# Patient Record
Sex: Female | Born: 1955 | Race: White | Hispanic: No | Marital: Married | Smoking: Never smoker
Health system: Southern US, Community
[De-identification: ages and names within clinical notes are randomized; demographics above are authoritative.]

## PROBLEM LIST (undated history)

## (undated) DIAGNOSIS — E039 Hypothyroidism, unspecified: Secondary | ICD-10-CM

## (undated) DIAGNOSIS — M8580 Other specified disorders of bone density and structure, unspecified site: Secondary | ICD-10-CM

## (undated) DIAGNOSIS — I214 Non-ST elevation (NSTEMI) myocardial infarction: Secondary | ICD-10-CM

## (undated) DIAGNOSIS — I2510 Atherosclerotic heart disease of native coronary artery without angina pectoris: Secondary | ICD-10-CM

## (undated) DIAGNOSIS — Z9289 Personal history of other medical treatment: Secondary | ICD-10-CM

## (undated) DIAGNOSIS — C801 Malignant (primary) neoplasm, unspecified: Secondary | ICD-10-CM

## (undated) DIAGNOSIS — T7840XA Allergy, unspecified, initial encounter: Secondary | ICD-10-CM

## (undated) DIAGNOSIS — F32A Depression, unspecified: Secondary | ICD-10-CM

## (undated) DIAGNOSIS — G4730 Sleep apnea, unspecified: Secondary | ICD-10-CM

## (undated) DIAGNOSIS — J40 Bronchitis, not specified as acute or chronic: Secondary | ICD-10-CM

## (undated) DIAGNOSIS — F988 Other specified behavioral and emotional disorders with onset usually occurring in childhood and adolescence: Secondary | ICD-10-CM

## (undated) DIAGNOSIS — E28319 Asymptomatic premature menopause: Secondary | ICD-10-CM

## (undated) DIAGNOSIS — C50919 Malignant neoplasm of unspecified site of unspecified female breast: Secondary | ICD-10-CM

## (undated) DIAGNOSIS — F419 Anxiety disorder, unspecified: Secondary | ICD-10-CM

## (undated) DIAGNOSIS — Z5111 Encounter for antineoplastic chemotherapy: Secondary | ICD-10-CM

## (undated) DIAGNOSIS — Z923 Personal history of irradiation: Secondary | ICD-10-CM

## (undated) DIAGNOSIS — K921 Melena: Secondary | ICD-10-CM

## (undated) DIAGNOSIS — E785 Hyperlipidemia, unspecified: Secondary | ICD-10-CM

## (undated) DIAGNOSIS — F329 Major depressive disorder, single episode, unspecified: Secondary | ICD-10-CM

## (undated) DIAGNOSIS — F319 Bipolar disorder, unspecified: Secondary | ICD-10-CM

## (undated) DIAGNOSIS — R32 Unspecified urinary incontinence: Secondary | ICD-10-CM

## (undated) DIAGNOSIS — Z9221 Personal history of antineoplastic chemotherapy: Secondary | ICD-10-CM

## (undated) HISTORY — DX: Anxiety disorder, unspecified: F41.9

## (undated) HISTORY — DX: Melena: K92.1

## (undated) HISTORY — DX: Unspecified urinary incontinence: R32

## (undated) HISTORY — DX: Hypothyroidism, unspecified: E03.9

## (undated) HISTORY — DX: Bipolar disorder, unspecified: F31.9

## (undated) HISTORY — DX: Bronchitis, not specified as acute or chronic: J40

## (undated) HISTORY — DX: Sleep apnea, unspecified: G47.30

## (undated) HISTORY — DX: Major depressive disorder, single episode, unspecified: F32.9

## (undated) HISTORY — DX: Personal history of other medical treatment: Z92.89

## (undated) HISTORY — DX: Malignant neoplasm of unspecified site of unspecified female breast: C50.919

## (undated) HISTORY — DX: Encounter for antineoplastic chemotherapy: Z51.11

## (undated) HISTORY — DX: Other specified behavioral and emotional disorders with onset usually occurring in childhood and adolescence: F98.8

## (undated) HISTORY — DX: Hyperlipidemia, unspecified: E78.5

## (undated) HISTORY — DX: Allergy, unspecified, initial encounter: T78.40XA

## (undated) HISTORY — DX: Asymptomatic premature menopause: E28.319

## (undated) HISTORY — DX: Depression, unspecified: F32.A

## (undated) HISTORY — DX: Other specified disorders of bone density and structure, unspecified site: M85.80

## (undated) HISTORY — DX: Malignant (primary) neoplasm, unspecified: C80.1

---

## 1987-02-20 DIAGNOSIS — C50919 Malignant neoplasm of unspecified site of unspecified female breast: Secondary | ICD-10-CM

## 1987-02-20 HISTORY — PX: BREAST SURGERY: SHX581

## 1987-02-20 HISTORY — DX: Malignant neoplasm of unspecified site of unspecified female breast: C50.919

## 1987-02-20 HISTORY — PX: BREAST LUMPECTOMY: SHX2

## 1994-02-19 HISTORY — PX: OTHER SURGICAL HISTORY: SHX169

## 1997-12-21 ENCOUNTER — Other Ambulatory Visit: Admission: RE | Admit: 1997-12-21 | Discharge: 1997-12-21 | Payer: Self-pay | Admitting: Gynecology

## 1998-10-02 ENCOUNTER — Encounter: Payer: Self-pay | Admitting: Pulmonary Disease

## 1998-10-02 ENCOUNTER — Ambulatory Visit: Admission: RE | Admit: 1998-10-02 | Discharge: 1998-10-02 | Payer: Self-pay

## 1999-04-24 ENCOUNTER — Other Ambulatory Visit: Admission: RE | Admit: 1999-04-24 | Discharge: 1999-04-24 | Payer: Self-pay | Admitting: Gynecology

## 1999-04-25 ENCOUNTER — Other Ambulatory Visit: Admission: RE | Admit: 1999-04-25 | Discharge: 1999-04-25 | Payer: Self-pay | Admitting: Gynecology

## 1999-04-25 ENCOUNTER — Encounter (INDEPENDENT_AMBULATORY_CARE_PROVIDER_SITE_OTHER): Payer: Self-pay

## 2000-09-03 ENCOUNTER — Other Ambulatory Visit: Admission: RE | Admit: 2000-09-03 | Discharge: 2000-09-03 | Payer: Self-pay | Admitting: Gynecology

## 2001-02-21 ENCOUNTER — Encounter: Admission: RE | Admit: 2001-02-21 | Discharge: 2001-05-22 | Payer: Self-pay | Admitting: Family Medicine

## 2001-10-10 ENCOUNTER — Other Ambulatory Visit: Admission: RE | Admit: 2001-10-10 | Discharge: 2001-10-10 | Payer: Self-pay | Admitting: Gynecology

## 2001-12-16 ENCOUNTER — Encounter: Admission: RE | Admit: 2001-12-16 | Discharge: 2002-03-16 | Payer: Self-pay | Admitting: Family Medicine

## 2002-04-20 ENCOUNTER — Encounter: Payer: Self-pay | Admitting: Internal Medicine

## 2002-04-28 ENCOUNTER — Encounter: Admission: RE | Admit: 2002-04-28 | Discharge: 2002-07-27 | Payer: Self-pay | Admitting: Family Medicine

## 2002-05-25 ENCOUNTER — Encounter: Admission: RE | Admit: 2002-05-25 | Discharge: 2002-08-23 | Payer: Self-pay | Admitting: Family Medicine

## 2002-10-13 ENCOUNTER — Other Ambulatory Visit: Admission: RE | Admit: 2002-10-13 | Discharge: 2002-10-13 | Payer: Self-pay | Admitting: Gynecology

## 2003-11-22 ENCOUNTER — Other Ambulatory Visit: Admission: RE | Admit: 2003-11-22 | Discharge: 2003-11-22 | Payer: Self-pay | Admitting: Gynecology

## 2004-03-27 ENCOUNTER — Ambulatory Visit: Payer: Self-pay

## 2004-04-24 ENCOUNTER — Ambulatory Visit: Payer: Self-pay

## 2004-04-28 ENCOUNTER — Ambulatory Visit: Payer: Self-pay | Admitting: Cardiology

## 2004-05-25 ENCOUNTER — Ambulatory Visit: Payer: Self-pay | Admitting: Internal Medicine

## 2004-08-11 ENCOUNTER — Ambulatory Visit: Payer: Self-pay | Admitting: Internal Medicine

## 2004-10-05 ENCOUNTER — Ambulatory Visit: Payer: Self-pay | Admitting: Internal Medicine

## 2004-10-12 ENCOUNTER — Ambulatory Visit: Payer: Self-pay | Admitting: Internal Medicine

## 2004-11-01 ENCOUNTER — Ambulatory Visit: Payer: Self-pay | Admitting: Internal Medicine

## 2004-11-27 ENCOUNTER — Ambulatory Visit: Payer: Self-pay | Admitting: Internal Medicine

## 2004-12-07 ENCOUNTER — Other Ambulatory Visit: Admission: RE | Admit: 2004-12-07 | Discharge: 2004-12-07 | Payer: Self-pay | Admitting: Gynecology

## 2005-04-13 ENCOUNTER — Ambulatory Visit: Payer: Self-pay | Admitting: Internal Medicine

## 2005-04-24 ENCOUNTER — Ambulatory Visit: Payer: Self-pay | Admitting: Internal Medicine

## 2005-10-18 ENCOUNTER — Ambulatory Visit: Payer: Self-pay | Admitting: Internal Medicine

## 2005-12-24 ENCOUNTER — Other Ambulatory Visit: Admission: RE | Admit: 2005-12-24 | Discharge: 2005-12-24 | Payer: Self-pay | Admitting: Gynecology

## 2006-01-18 ENCOUNTER — Ambulatory Visit: Payer: Self-pay | Admitting: Internal Medicine

## 2006-02-15 ENCOUNTER — Ambulatory Visit: Payer: Self-pay | Admitting: Internal Medicine

## 2006-05-15 ENCOUNTER — Ambulatory Visit: Payer: Self-pay | Admitting: Internal Medicine

## 2006-08-01 ENCOUNTER — Telehealth: Payer: Self-pay | Admitting: Internal Medicine

## 2006-08-02 ENCOUNTER — Ambulatory Visit: Payer: Self-pay | Admitting: Internal Medicine

## 2006-08-12 DIAGNOSIS — Z853 Personal history of malignant neoplasm of breast: Secondary | ICD-10-CM | POA: Insufficient documentation

## 2006-08-22 ENCOUNTER — Ambulatory Visit: Payer: Self-pay | Admitting: Internal Medicine

## 2006-10-01 ENCOUNTER — Ambulatory Visit: Payer: Self-pay | Admitting: Internal Medicine

## 2006-10-01 DIAGNOSIS — E039 Hypothyroidism, unspecified: Secondary | ICD-10-CM

## 2006-11-04 ENCOUNTER — Telehealth (INDEPENDENT_AMBULATORY_CARE_PROVIDER_SITE_OTHER): Payer: Self-pay | Admitting: *Deleted

## 2006-11-12 ENCOUNTER — Telehealth (INDEPENDENT_AMBULATORY_CARE_PROVIDER_SITE_OTHER): Payer: Self-pay | Admitting: *Deleted

## 2007-01-14 ENCOUNTER — Other Ambulatory Visit: Admission: RE | Admit: 2007-01-14 | Discharge: 2007-01-14 | Payer: Self-pay | Admitting: Gynecology

## 2007-01-27 ENCOUNTER — Encounter: Payer: Self-pay | Admitting: Internal Medicine

## 2007-06-09 ENCOUNTER — Other Ambulatory Visit (HOSPITAL_COMMUNITY): Admission: RE | Admit: 2007-06-09 | Discharge: 2007-06-16 | Payer: Self-pay | Admitting: Psychiatry

## 2007-06-11 ENCOUNTER — Ambulatory Visit: Payer: Self-pay | Admitting: Psychiatry

## 2007-06-25 ENCOUNTER — Encounter: Payer: Self-pay | Admitting: Internal Medicine

## 2007-07-07 ENCOUNTER — Encounter (INDEPENDENT_AMBULATORY_CARE_PROVIDER_SITE_OTHER): Payer: Self-pay | Admitting: *Deleted

## 2007-07-07 ENCOUNTER — Ambulatory Visit: Payer: Self-pay | Admitting: Internal Medicine

## 2007-07-07 DIAGNOSIS — E785 Hyperlipidemia, unspecified: Secondary | ICD-10-CM | POA: Insufficient documentation

## 2007-07-10 ENCOUNTER — Encounter: Payer: Self-pay | Admitting: Internal Medicine

## 2007-07-10 ENCOUNTER — Encounter (INDEPENDENT_AMBULATORY_CARE_PROVIDER_SITE_OTHER): Payer: Self-pay | Admitting: *Deleted

## 2007-07-21 ENCOUNTER — Ambulatory Visit: Payer: Self-pay | Admitting: Pulmonary Disease

## 2007-07-21 DIAGNOSIS — G4733 Obstructive sleep apnea (adult) (pediatric): Secondary | ICD-10-CM

## 2007-07-29 ENCOUNTER — Telehealth (INDEPENDENT_AMBULATORY_CARE_PROVIDER_SITE_OTHER): Payer: Self-pay | Admitting: *Deleted

## 2007-08-25 ENCOUNTER — Telehealth: Payer: Self-pay | Admitting: Internal Medicine

## 2007-08-26 ENCOUNTER — Encounter: Payer: Self-pay | Admitting: Internal Medicine

## 2007-08-28 ENCOUNTER — Telehealth: Payer: Self-pay | Admitting: Internal Medicine

## 2007-08-29 ENCOUNTER — Ambulatory Visit: Payer: Self-pay | Admitting: Internal Medicine

## 2007-09-02 ENCOUNTER — Encounter: Admission: RE | Admit: 2007-09-02 | Discharge: 2007-09-02 | Payer: Self-pay | Admitting: Otolaryngology

## 2007-09-03 ENCOUNTER — Telehealth (INDEPENDENT_AMBULATORY_CARE_PROVIDER_SITE_OTHER): Payer: Self-pay | Admitting: *Deleted

## 2007-09-05 ENCOUNTER — Encounter: Payer: Self-pay | Admitting: Internal Medicine

## 2007-09-07 LAB — CONVERTED CEMR LAB
Bilirubin, Direct: 0.1 mg/dL (ref 0.0–0.3)
Hgb A1c MFr Bld: 6.4 % — ABNORMAL HIGH (ref 4.6–6.0)
Total Bilirubin: 0.6 mg/dL (ref 0.3–1.2)

## 2007-09-09 ENCOUNTER — Encounter (INDEPENDENT_AMBULATORY_CARE_PROVIDER_SITE_OTHER): Payer: Self-pay | Admitting: *Deleted

## 2007-09-09 ENCOUNTER — Encounter: Payer: Self-pay | Admitting: Pulmonary Disease

## 2007-09-11 ENCOUNTER — Telehealth: Payer: Self-pay | Admitting: Internal Medicine

## 2007-09-15 ENCOUNTER — Ambulatory Visit: Payer: Self-pay | Admitting: Gastroenterology

## 2007-09-15 DIAGNOSIS — R1319 Other dysphagia: Secondary | ICD-10-CM

## 2007-09-15 DIAGNOSIS — K222 Esophageal obstruction: Secondary | ICD-10-CM | POA: Insufficient documentation

## 2007-09-15 DIAGNOSIS — R1310 Dysphagia, unspecified: Secondary | ICD-10-CM | POA: Insufficient documentation

## 2007-09-15 DIAGNOSIS — K648 Other hemorrhoids: Secondary | ICD-10-CM | POA: Insufficient documentation

## 2007-09-23 ENCOUNTER — Ambulatory Visit: Payer: Self-pay | Admitting: Internal Medicine

## 2007-09-23 ENCOUNTER — Encounter: Payer: Self-pay | Admitting: Internal Medicine

## 2007-09-24 ENCOUNTER — Encounter: Payer: Self-pay | Admitting: Internal Medicine

## 2007-10-09 ENCOUNTER — Encounter: Payer: Self-pay | Admitting: Internal Medicine

## 2007-10-21 ENCOUNTER — Encounter: Payer: Self-pay | Admitting: Internal Medicine

## 2007-12-18 ENCOUNTER — Telehealth (INDEPENDENT_AMBULATORY_CARE_PROVIDER_SITE_OTHER): Payer: Self-pay | Admitting: *Deleted

## 2008-01-23 ENCOUNTER — Encounter: Payer: Self-pay | Admitting: Internal Medicine

## 2008-01-24 ENCOUNTER — Encounter: Payer: Self-pay | Admitting: Pulmonary Disease

## 2008-02-05 ENCOUNTER — Encounter: Payer: Self-pay | Admitting: Gynecology

## 2008-02-05 ENCOUNTER — Ambulatory Visit: Payer: Self-pay | Admitting: Gynecology

## 2008-02-05 ENCOUNTER — Other Ambulatory Visit: Admission: RE | Admit: 2008-02-05 | Discharge: 2008-02-05 | Payer: Self-pay | Admitting: Gynecology

## 2008-02-10 ENCOUNTER — Encounter: Payer: Self-pay | Admitting: Internal Medicine

## 2008-02-19 ENCOUNTER — Ambulatory Visit: Payer: Self-pay | Admitting: Internal Medicine

## 2008-02-19 DIAGNOSIS — R209 Unspecified disturbances of skin sensation: Secondary | ICD-10-CM

## 2008-02-19 DIAGNOSIS — R259 Unspecified abnormal involuntary movements: Secondary | ICD-10-CM | POA: Insufficient documentation

## 2008-02-24 ENCOUNTER — Encounter (INDEPENDENT_AMBULATORY_CARE_PROVIDER_SITE_OTHER): Payer: Self-pay | Admitting: *Deleted

## 2008-02-24 LAB — CONVERTED CEMR LAB
Albumin: 4.2 g/dL (ref 3.5–5.2)
Alkaline Phosphatase: 125 units/L — ABNORMAL HIGH (ref 39–117)
Folate: 11 ng/mL
Hgb A1c MFr Bld: 8 % — ABNORMAL HIGH (ref 4.6–6.1)
Iron: 99 ug/dL (ref 42–145)
Saturation Ratios: 30 % (ref 20–55)
TIBC: 325 ug/dL (ref 250–470)
TSH: 5.161 microintl units/mL — ABNORMAL HIGH (ref 0.350–4.50)
Total Bilirubin: 0.5 mg/dL (ref 0.3–1.2)
Total Protein: 7.6 g/dL (ref 6.0–8.3)
UIBC: 226 ug/dL

## 2008-04-14 ENCOUNTER — Telehealth: Payer: Self-pay | Admitting: Internal Medicine

## 2008-04-16 ENCOUNTER — Ambulatory Visit: Payer: Self-pay | Admitting: Gynecology

## 2008-04-20 ENCOUNTER — Encounter: Payer: Self-pay | Admitting: Gynecology

## 2008-04-20 ENCOUNTER — Ambulatory Visit: Payer: Self-pay | Admitting: Gynecology

## 2008-04-27 ENCOUNTER — Ambulatory Visit: Payer: Self-pay | Admitting: Internal Medicine

## 2008-05-10 ENCOUNTER — Encounter (INDEPENDENT_AMBULATORY_CARE_PROVIDER_SITE_OTHER): Payer: Self-pay | Admitting: *Deleted

## 2008-05-10 LAB — CONVERTED CEMR LAB
AST: 90 units/L — ABNORMAL HIGH (ref 0–37)
Bilirubin, Direct: 0.1 mg/dL (ref 0.0–0.3)
Creatinine,U: 145.7 mg/dL
Direct LDL: 177 mg/dL
Hgb A1c MFr Bld: 7 % — ABNORMAL HIGH (ref 4.6–6.0)
Microalb, Ur: 2.6 mg/dL — ABNORMAL HIGH (ref 0.0–1.9)
Total Bilirubin: 0.7 mg/dL (ref 0.3–1.2)
Total CHOL/HDL Ratio: 5.4
Triglycerides: 171 mg/dL — ABNORMAL HIGH (ref 0–149)

## 2008-05-18 ENCOUNTER — Telehealth (INDEPENDENT_AMBULATORY_CARE_PROVIDER_SITE_OTHER): Payer: Self-pay | Admitting: *Deleted

## 2008-05-19 ENCOUNTER — Ambulatory Visit: Payer: Self-pay | Admitting: Internal Medicine

## 2008-05-19 DIAGNOSIS — B379 Candidiasis, unspecified: Secondary | ICD-10-CM | POA: Insufficient documentation

## 2008-07-06 ENCOUNTER — Ambulatory Visit: Payer: Self-pay | Admitting: Gynecology

## 2008-07-22 ENCOUNTER — Ambulatory Visit: Payer: Self-pay | Admitting: Internal Medicine

## 2008-07-22 LAB — CONVERTED CEMR LAB
Creatinine, Ser: 0.9 mg/dL (ref 0.4–1.2)
Creatinine,U: 288.7 mg/dL
HDL: 48.7 mg/dL (ref >39.00)
Microalb Creat Ratio: 89.4 mg/g — ABNORMAL HIGH (ref 0.0–30.0)
Microalb, Ur: 25.8 mg/dL — ABNORMAL HIGH (ref 0.0–1.9)
Potassium: 3.8 meq/L (ref 3.5–5.1)
VLDL: 38.6 mg/dL (ref 0.0–40.0)

## 2008-07-28 ENCOUNTER — Ambulatory Visit: Payer: Self-pay | Admitting: Internal Medicine

## 2008-07-28 DIAGNOSIS — R740 Nonspecific elevation of levels of transaminase and lactic acid dehydrogenase [LDH]: Secondary | ICD-10-CM

## 2008-07-28 DIAGNOSIS — E119 Type 2 diabetes mellitus without complications: Secondary | ICD-10-CM | POA: Insufficient documentation

## 2008-10-27 ENCOUNTER — Ambulatory Visit: Payer: Self-pay | Admitting: Internal Medicine

## 2008-11-01 ENCOUNTER — Encounter (INDEPENDENT_AMBULATORY_CARE_PROVIDER_SITE_OTHER): Payer: Self-pay | Admitting: *Deleted

## 2008-11-10 ENCOUNTER — Telehealth (INDEPENDENT_AMBULATORY_CARE_PROVIDER_SITE_OTHER): Payer: Self-pay | Admitting: *Deleted

## 2008-11-12 ENCOUNTER — Ambulatory Visit: Payer: Self-pay | Admitting: Internal Medicine

## 2008-12-16 ENCOUNTER — Telehealth (INDEPENDENT_AMBULATORY_CARE_PROVIDER_SITE_OTHER): Payer: Self-pay | Admitting: *Deleted

## 2009-02-07 ENCOUNTER — Ambulatory Visit: Payer: Self-pay | Admitting: Women's Health

## 2009-02-07 ENCOUNTER — Other Ambulatory Visit: Admission: RE | Admit: 2009-02-07 | Discharge: 2009-02-07 | Payer: Self-pay | Admitting: Gynecology

## 2009-02-19 HISTORY — PX: HYSTEROSCOPY: SHX211

## 2009-03-07 ENCOUNTER — Ambulatory Visit: Payer: Self-pay | Admitting: Internal Medicine

## 2009-03-10 ENCOUNTER — Telehealth (INDEPENDENT_AMBULATORY_CARE_PROVIDER_SITE_OTHER): Payer: Self-pay | Admitting: *Deleted

## 2009-03-10 LAB — CONVERTED CEMR LAB
ALT: 136 units/L — ABNORMAL HIGH (ref 0–35)
AST: 149 units/L — ABNORMAL HIGH (ref 0–37)
Alkaline Phosphatase: 65 units/L (ref 39–117)
Bilirubin, Direct: 0.1 mg/dL (ref 0.0–0.3)
Total Bilirubin: 0.6 mg/dL (ref 0.3–1.2)

## 2009-03-14 ENCOUNTER — Ambulatory Visit: Payer: Self-pay | Admitting: Internal Medicine

## 2009-03-22 DIAGNOSIS — M8580 Other specified disorders of bone density and structure, unspecified site: Secondary | ICD-10-CM

## 2009-03-22 HISTORY — DX: Other specified disorders of bone density and structure, unspecified site: M85.80

## 2009-04-06 ENCOUNTER — Encounter: Payer: Self-pay | Admitting: Internal Medicine

## 2009-04-13 ENCOUNTER — Ambulatory Visit: Payer: Self-pay | Admitting: Women's Health

## 2009-05-19 ENCOUNTER — Ambulatory Visit: Payer: Self-pay | Admitting: Internal Medicine

## 2009-05-19 LAB — CONVERTED CEMR LAB
Bilirubin, Direct: 0 mg/dL (ref 0.0–0.3)
Direct LDL: 176.9 mg/dL
HDL: 63.3 mg/dL (ref >39.00)
Total Bilirubin: 0.7 mg/dL (ref 0.3–1.2)
Total CHOL/HDL Ratio: 4
Total Protein: 8 g/dL (ref 6.0–8.3)
VLDL: 29 mg/dL (ref 0.0–40.0)

## 2009-05-24 ENCOUNTER — Ambulatory Visit: Payer: Self-pay | Admitting: Internal Medicine

## 2009-05-31 ENCOUNTER — Telehealth (INDEPENDENT_AMBULATORY_CARE_PROVIDER_SITE_OTHER): Payer: Self-pay | Admitting: *Deleted

## 2009-05-31 ENCOUNTER — Ambulatory Visit: Payer: Self-pay | Admitting: Gynecology

## 2009-06-07 ENCOUNTER — Ambulatory Visit: Payer: Self-pay | Admitting: Gynecology

## 2009-06-15 ENCOUNTER — Ambulatory Visit (HOSPITAL_COMMUNITY): Admission: RE | Admit: 2009-06-15 | Discharge: 2009-06-15 | Payer: Self-pay | Admitting: Psychiatry

## 2009-06-28 ENCOUNTER — Encounter: Payer: Self-pay | Admitting: Internal Medicine

## 2009-07-06 ENCOUNTER — Ambulatory Visit: Payer: Self-pay | Admitting: Gynecology

## 2009-07-08 ENCOUNTER — Ambulatory Visit: Payer: Self-pay | Admitting: Gynecology

## 2009-07-08 ENCOUNTER — Ambulatory Visit (HOSPITAL_BASED_OUTPATIENT_CLINIC_OR_DEPARTMENT_OTHER): Admission: RE | Admit: 2009-07-08 | Discharge: 2009-07-08 | Payer: Self-pay | Admitting: Gynecology

## 2009-07-22 ENCOUNTER — Ambulatory Visit: Payer: Self-pay | Admitting: Gynecology

## 2009-08-24 ENCOUNTER — Ambulatory Visit: Payer: Self-pay | Admitting: Internal Medicine

## 2009-08-24 LAB — CONVERTED CEMR LAB
AST: 36 units/L (ref 0–37)
Alkaline Phosphatase: 65 units/L (ref 39–117)
Cholesterol: 230 mg/dL — ABNORMAL HIGH (ref 0–200)
Direct LDL: 151.3 mg/dL
Total Bilirubin: 0.3 mg/dL (ref 0.3–1.2)
Total CHOL/HDL Ratio: 4
VLDL: 33 mg/dL (ref 0.0–40.0)

## 2009-09-01 ENCOUNTER — Ambulatory Visit: Payer: Self-pay | Admitting: Internal Medicine

## 2009-09-01 DIAGNOSIS — J309 Allergic rhinitis, unspecified: Secondary | ICD-10-CM | POA: Insufficient documentation

## 2009-09-01 DIAGNOSIS — R32 Unspecified urinary incontinence: Secondary | ICD-10-CM | POA: Insufficient documentation

## 2009-10-18 ENCOUNTER — Ambulatory Visit: Payer: Self-pay | Admitting: Gynecology

## 2009-11-04 ENCOUNTER — Ambulatory Visit: Payer: Self-pay | Admitting: Internal Medicine

## 2009-11-04 DIAGNOSIS — R42 Dizziness and giddiness: Secondary | ICD-10-CM

## 2009-11-04 DIAGNOSIS — R9431 Abnormal electrocardiogram [ECG] [EKG]: Secondary | ICD-10-CM

## 2009-12-28 ENCOUNTER — Telehealth (INDEPENDENT_AMBULATORY_CARE_PROVIDER_SITE_OTHER): Payer: Self-pay | Admitting: *Deleted

## 2010-01-04 ENCOUNTER — Encounter: Payer: Self-pay | Admitting: Internal Medicine

## 2010-02-08 ENCOUNTER — Ambulatory Visit: Payer: Self-pay | Admitting: Gynecology

## 2010-02-08 ENCOUNTER — Encounter: Payer: Self-pay | Admitting: Internal Medicine

## 2010-02-08 ENCOUNTER — Ambulatory Visit: Payer: Self-pay | Admitting: Internal Medicine

## 2010-02-10 LAB — CONVERTED CEMR LAB: Ferritin: 42.4 ng/mL (ref 10.0–291.0)

## 2010-02-15 ENCOUNTER — Telehealth (INDEPENDENT_AMBULATORY_CARE_PROVIDER_SITE_OTHER): Payer: Self-pay | Admitting: *Deleted

## 2010-03-19 LAB — CONVERTED CEMR LAB
ALT: 66 units/L — ABNORMAL HIGH (ref 0–35)
ALT: 95 units/L — ABNORMAL HIGH (ref 0–35)
Albumin: 3.4 g/dL — ABNORMAL LOW (ref 3.5–5.2)
Basophils Absolute: 0 10*3/uL (ref 0.0–0.1)
Basophils Relative: 0.7 % (ref 0.0–1.0)
Bilirubin, Direct: 0.1 mg/dL (ref 0.0–0.3)
CO2: 34 meq/L — ABNORMAL HIGH (ref 19–32)
Calcium: 9.2 mg/dL (ref 8.4–10.5)
Cholesterol, target level: 200 mg/dL
Cholesterol: 167 mg/dL (ref 0–200)
Creatinine, Ser: 0.6 mg/dL (ref 0.4–1.2)
Eosinophils Absolute: 0.2 10*3/uL (ref 0.0–0.7)
Eosinophils Relative: 2 % (ref 0–5)
GFR calc Af Amer: 137 mL/min
Glucose, Bld: 102 mg/dL — ABNORMAL HIGH (ref 70–99)
HCT: 38.9 % (ref 36.0–46.0)
HCT: 47.8 % — ABNORMAL HIGH (ref 36.0–46.0)
Hemoglobin: 13.1 g/dL (ref 12.0–15.0)
Hgb A1c MFr Bld: 5.6 % (ref 4.6–6.5)
LDL Goal: 160 mg/dL
Lymphocytes Relative: 31 % (ref 12–46)
Lymphocytes Relative: 40.6 % (ref 12.0–46.0)
MCHC: 33.6 g/dL (ref 30.0–36.0)
MCV: 86.1 fL (ref 78.0–100.0)
Monocytes Absolute: 0.7 10*3/uL (ref 0.1–1.0)
Monocytes Relative: 11.1 % (ref 3.0–12.0)
Neutro Abs: 2.4 10*3/uL (ref 1.4–7.7)
Platelets: 302 10*3/uL (ref 150–400)
Potassium: 4.7 meq/L (ref 3.5–5.3)
RBC: 4.29 M/uL (ref 3.87–5.11)
RDW: 13.5 % (ref 11.5–14.6)
RDW: 13.7 % (ref 11.5–15.5)
Sodium: 145 meq/L (ref 135–145)
TSH: 0.51 microintl units/mL (ref 0.35–5.50)
TSH: 2.76 microintl units/mL (ref 0.35–5.50)
Total CHOL/HDL Ratio: 4.2
Total Protein: 6.5 g/dL (ref 6.0–8.3)
Total Protein: 7.1 g/dL (ref 6.0–8.3)
Triglycerides: 130 mg/dL (ref 0–149)

## 2010-03-21 LAB — CONVERTED CEMR LAB: Lithium Lvl: 0.63 meq/L — ABNORMAL LOW (ref 0.80–1.40)

## 2010-03-23 NOTE — Progress Notes (Signed)
Summary: Lab Results  Phone Note Outgoing Call Call back at [facility] Phone [phone]   Call placed by: Shonna Chock,  March 10, 2009 10:42 AM Call placed to: Patient Summary of Call: Left message on machine for patient to return call when avaliable (Home Number), Reason for call:   Marked liver function elevations; OV needed with all meds .Shonna Chock  March 10, 2009 10:43 AM   Left message on machine for patient to return call when avaliable(Work Number), Reason for call:  I informed patient of lab results and addressed levels abnormal and she needs to call the office to futher follow-up with Dr.Hopper  Shonna Chock  March 10, 2009 10:46 AM   Follow-up for Phone Call        Abilify &  Seroquel may affect liver enzymes. FAX labs to Dr Andee Poles ASAP Follow-up by: Marga Melnick MD,  March 11, 2009 8:29 AM  Additional Follow-up for Phone Call Additional follow up Details #1::        Patient aware, labs faxed and patient with pending appointment Monday./[facility]  March 11, 2009 11:24 AM

## 2010-03-23 NOTE — Progress Notes (Signed)
  Phone Note Call from Patient Call back at Home Phone [phone]   Caller: Patient Summary of Call: pt called was seen 05/24/09 for thyroid,. she is menopausal and  has started her period. --Wanted to know should she see her gyn, recommend pt to call her gynecologist pt agreed .Kandice Hams  May 31, 2009 8:53 AM   Initial call taken by: Kandice Hams,  May 31, 2009 8:53 AM

## 2010-03-23 NOTE — Progress Notes (Signed)
Summary: Refill Request  Phone Note Refill Request Call back at [phone] Message from:  Pharmacy on February 15, 2010 8:46 AM  Refills Requested: Medication #1:  LEVOXYL 100 MCG  TABS (LEVOTHYROXINE SODIUM) 1 by mouth once daily except 1 & 1/2 Weds only   Dosage confirmed as above?Dosage Confirmed   Brand Name Necessary? No   Supply Requested: 1 month   [facility] [address]   Next Appointment Scheduled: none Initial call taken by: Harold Barban,  February 15, 2010 8:47 AM    Prescriptions: LEVOXYL 100 MCG  TABS (LEVOTHYROXINE SODIUM) 1 by mouth once daily except 1 & 1/2 Weds only  #34 x 11   Entered by:   Shonna Chock CMA   Authorized by:   Marga Melnick MD   Signed by:   Shonna Chock CMA on 02/15/2010   Method used:   [facility]ed to ...       [facility] [address] (retail)       [address]       [place], [place]  [address]       Ph: [phone] or [phone]       Fax: [phone]   RxID:   [id number]

## 2010-03-23 NOTE — Assessment & Plan Note (Signed)
Summary: 3 MONTH FOLLOWUP, DISCUSS LABS///SP[facility]   Vital Signs:  Patient profile:   55 year old female Weight:      172.8 pounds Pulse rate:   80 / minute Resp:     15 per minute BP sitting:   104 / 70  (left arm) Cuff size:   large  Vitals Entered By: Shonna Chock CMA (September 01, 2009 4:14 PM) CC: Follow-up visit: Discuss Labs (copy given), Lipid Management Comments REVIEWED MED LIST, PATIENT AGREED DOSE AND INSTRUCTION CORRECT    Primary Care Provider:  Alwyn Ren  CC:  Follow-up visit: Discuss Labs (copy given) and Lipid Management.  History of Present Illness: Labs reviewed & risks discussed. Dramatic improvement in LFTs & TSH. A1c in non Diabetes range.Major issue is sneeze induced incontinence from seasonal allergies.See ENT ROS.; purulence w/o other rhinosinusitis symptoms.  Lipid Management History:      Positive NCEP/ATP III risk factors include early menopause without estrogen hormone replacement and diabetes.  Negative NCEP/ATP III risk factors include female age less than 76 years old, HDL cholesterol greater than 60, no family history for ischemic heart disease, non-tobacco-user status, non-hypertensive, no ASHD (atherosclerotic heart disease), no prior stroke/TIA, no peripheral vascular disease, and no history of aortic aneurysm.     Allergies (verified): No Known Drug Allergies  Review of Systems General:  Denies chills, fever, and sweats. ENT:  Complains of nasal congestion and sinus pressure; No frontal headache or facial pressure. Some purulence fro nose chronically. GU:  Denies discharge, dysuria, hematuria, and urinary frequency. Endo:  Denies excessive hunger, excessive thirst, and excessive urination. Allergy:  Complains of itching eyes and sneezing; Rx: none.  Physical Exam  General:  well-nourished; alert,appropriate and cooperative throughout examination Eyes:  No corneal or conjunctival inflammation noted. No icterus Ears:  External ear exam shows no  significant lesions or deformities.  Otoscopic examination reveals clear canals, tympanic membranes are intact bilaterally without bulging, retraction, inflammation or discharge. Hearing is grossly normal bilaterally. Nose:  External nasal examination shows no deformity or inflammation. Nasal mucosa are pink and moist without lesions or exudates.Septal dislocation Mouth:  Oral mucosa and oropharynx without lesions or exudates.  Teeth in good repair. Neck:  No deformities, masses, or tenderness noted. Heart:  Normal rate and regular rhythm. S1 and S2 normal without gallop, murmur, click, rub . Abdomen:  Bowel sounds positive,abdomen soft and non-tender without masses, organomegaly or hernias noted. Skin:  No jaundice Cervical Nodes:  No lymphadenopathy noted Axillary Nodes:  No palpable lymphadenopathy Psych:  memory intact for recent and remote, normally interactive, and good eye contact.     Impression & Recommendations:  Problem # 1:  NONSPEC ELEVATION OF LEVELS OF TRANSAMINASE/LDH (ICD-790.4) Dramatic improvement  Problem # 2:  HYPOTHYROIDISM (ICD-244.9) TSH slightly low  Problem # 3:  DIAB W/O COMP TYPE II/UNS NOT STATED UNCNTRL (ICD-250.00) A1c now normal Her updated medication list for this problem includes:    Metformin Hcl 500 Mg Xr24h-tab (Metformin hcl) .Marland Kitchen... 1 pill with  largest meal    Aspirin 81 Mg Tbec (Aspirin) .Marland Kitchen... Take 1 tab once daily  Problem # 4:  ALLERGIC RHINITIS (ICD-477.9)  Her updated medication list for this problem includes:    Fluticasone Propionate 50 Mcg/act Susp (Fluticasone propionate) .Marland Kitchen... 1 spray two times a day    Loratadine 10 Mg Tabs (Loratadine) .Marland Kitchen... 1 once daily as needed for allergies  Problem # 5:  INCONTINENCE (ICD-788.30) dueto sneezing ffom # 4  Complete Medication List: 1)  Paxil 40 Mg Tabs (Paroxetine hcl) .Marland Kitchen.. 1 by mouth qam 2)  Levoxyl 100 Mcg Tabs (levothyroxine Sodium)  .Marland Kitchen.. 1 by mouth once daily except 1 & 1/2 t & sat 3)   Abilify 10 Mg Tabs (Aripiprazole) .... Take 1 tab once daily 4)  Zegerid Otc 20-1100 Mg Caps (Omeprazole-sodium bicarbonate) .... Take 1 tab once daily 5)  Metformin Hcl 500 Mg Xr24h-tab (Metformin hcl) .Marland Kitchen.. 1 pill with  largest meal 6)  Aspirin 81 Mg Tbec (Aspirin) .... Take 1 tab once daily 7)  Zaleplon 10 Mg Caps (Zaleplon) .Marland Kitchen.. 1 by mouth at bedtime 8)  Geodon 60 Mg Caps (Ziprasidone hcl) .Marland Kitchen.. 1 by mouth two times a day 9)  Fluticasone Propionate 50 Mcg/act Susp (Fluticasone propionate) .Marland Kitchen.. 1 spray two times a day 10)  Loratadine 10 Mg Tabs (Loratadine) .Marland Kitchen.. 1 once daily as needed for allergies  Lipid Assessment/Plan:      Based on NCEP/ATP III, the patient's risk factor category is "history of diabetes".  The patient's lipid goals are as follows: Total cholesterol goal is 200; LDL cholesterol goal is 100; HDL cholesterol goal is 40; Triglyceride goal is 150.  Her LDL cholesterol goal has not been met.  Secondary causes for hyperlipidemia have been ruled out.  She has been counseled on adjunctive measures for lowering her cholesterol and has been provided with dietary instructions.    Patient Instructions: 1)  Neti pot once daily as needed for nasal congestion. Stop Welchol.Consume < 30 grams of HFCS sugar/ day as discussed.Please schedule a follow-up appointment in 4 months. 2)  Hepatic Panel prior to visit, ICD-9:790.4 3)  Lipid Panel prior to visit, ICD-9:272.4 4)  HbgA1C prior to visit, ICD-9:250.00 5)  TSH prior to visit, ICD-9:244.99 Prescriptions: LORATADINE 10 MG TABS (LORATADINE) 1 once daily as needed for allergies  #30 x 5   Entered and Authorized by:   Marga Melnick MD   Signed by:   Marga Melnick MD on 09/01/2009   Method used:   Print then Give to Patient   RxID:   [id number] FLUTICASONE PROPIONATE 50 MCG/ACT SUSP (FLUTICASONE PROPIONATE) 1 spray two times a day  #1 x 5   Entered and Authorized by:   Marga Melnick MD   Signed by:   Marga Melnick MD on  09/01/2009   Method used:   Print then Give to Patient   RxID:   [id number] LEVOXYL 100 MCG  TABS (LEVOTHYROXINE SODIUM) 1 by mouth once daily except 1 & 1/2 T & Sat  #90 x 0   Entered and Authorized by:   Marga Melnick MD   Signed by:   Marga Melnick MD on 09/01/2009   Method used:   Print then Give to Patient   RxID:   [id number]

## 2010-03-23 NOTE — Assessment & Plan Note (Signed)
Summary: FOLLOW UP, LABS PRIOR/RH.........Marland Kitchen   Vital Signs:  Patient profile:   55 year old female Weight:      187.6 pounds BMI:     35.87 Temp:     98.6 degrees F oral Pulse rate:   80 / minute Resp:     17 per minute BP sitting:   128 / 78  (left arm) Cuff size:   large  Vitals Entered By: Shonna Chock (May 24, 2009 4:14 PM) CC: 3 month follow-up and ? allergies-sore throat that just started this afternoon Comments REVIEWED MED LIST, PATIENT AGREED DOSE AND INSTRUCTION CORRECT    Primary Care Provider:  Alwyn Ren  CC:  3 month follow-up and ? allergies-sore throat that just started this afternoon.  History of Present Illness: Labs reviewed &  risks discussed. All lipids & LFTs improved despite TSH of 10.21. CVE as walking intermittently; decreased sugars in diet.  Allergies (verified): No Known Drug Allergies  Review of Systems General:  Complains of fatigue; Weight decreasing with nutrition changes. ENT:  Denies difficulty swallowing and hoarseness. GI:  Denies constipation and diarrhea. Derm:  Denies changes in nail beds, dryness, and hair loss. Neuro:  Denies numbness and tingling. Endo:  Complains of cold intolerance; denies heat intolerance.  Physical Exam  General:  in no acute distress; alert,appropriate and cooperative throughout examination Neck:  No deformities, masses, or tenderness noted. Heart:  Normal rate and regular rhythm. S1 and S2 normal without gallop, murmur, click, rub . Neurologic:  alert & oriented X3 and DTRs symmetrical and normal.  Fine tremor of hands Skin:  Intact without suspicious lesions or rashes Psych:  Oriented X3, normally interactive, and good eye contact.     Impression & Recommendations:  Problem # 1:  HYPOTHYROIDISM (ICD-244.9)  Problem # 2:  NONSPEC ELEVATION OF LEVELS OF TRANSAMINASE/LDH (ICD-790.4) improved  Problem # 3:  HYPERLIPIDEMIA (ICD-272.4) improved Her updated medication list for this problem includes:   Welchol 3.75 Gm Pack (Colesevelam hcl) .Marland Kitchen... 1 packet once daily  Complete Medication List: 1)  Paxil 40 Mg Tabs (Paroxetine hcl) .Marland Kitchen.. 1 by mouth qam 2)  Levoxyl 100 Mcg Tabs (levothyroxine Sodium)  .Marland Kitchen.. 1 by mouth once daily except 1 & 1/2 t, th, & sat 3)  Abilify 10 Mg Tabs (Aripiprazole) .... Take 1 tab once daily 4)  Zegerid Otc 20-1100 Mg Caps (Omeprazole-sodium bicarbonate) .... Take 1 tab once daily 5)  Metformin Hcl 500 Mg Xr24h-tab (Metformin hcl) .Marland Kitchen.. 1 two times a day with 2 largest meals 6)  Aspirin 81 Mg Tbec (Aspirin) .... Take 1 tab once daily 7)  Welchol 3.75 Gm Pack (Colesevelam hcl) .Marland Kitchen.. 1 packet once daily 8)  Zaleplon 10 Mg Caps (Zaleplon) .Marland Kitchen.. 1 by mouth at bedtime 9)  Geodon 60 Mg Caps (Ziprasidone hcl) .Marland Kitchen.. 1 by mouth once daily  Patient Instructions: 1)  Please schedule a follow-up appointment in 3 months. 2)  Hepatic Panel prior to visit, ICD-9:790.4 3)  Lipid Panel prior to visit, ICD-9:272.4 4)  TSH prior to visit, ICD-9:244.9 5)  HbgA1C prior to visit, ICD-9:277.7 Prescriptions: LEVOXYL 100 MCG  TABS (LEVOTHYROXINE SODIUM) 1 by mouth once daily except 1 & 1/2 T, Th, & Sat  #90 x 1   Entered and Authorized by:   Marga Melnick MD   Signed by:   Marga Melnick MD on 05/24/2009   Method used:   [facility] ...       [phone] (retail[facility]  [address]       [place], [place]  [address]       Ph: [phone] or [phone]       Fax: [phone]   RxID:   [id number]

## 2010-03-23 NOTE — Letter (Signed)
Summary: [facility]  [facility]   Imported By: Lanelle Bal 07/19/2009 12:51:57  _____________________________________________________________________  External Attachment:    Type:   Image     Comment:   External Document

## 2010-03-23 NOTE — Assessment & Plan Note (Signed)
Summary: FEELS AS IF SHE IS GOING TO PASS OUT.CBS   Vital Signs:  Patient profile:   55 year old female Weight:      169.6 pounds BMI:     32.43 O2 Sat:      98 % Temp:     98.5 degrees F oral Pulse rate:   98 / minute Resp:     17 per minute BP sitting:   116 / 70  (left arm) Cuff size:   large  Vitals Entered By: Shonna Chock CMA (November 04, 2009 2:37 PM) CC: Feel as if she is going to pass out off/on-? if this is all related to her depression, Syncope   Primary Care Provider:  Alwyn Ren  CC:  Feel as if she is going to pass out off/on-? if this is all related to her depression and Syncope.  History of Present Illness:      This is a 55 year old woman who presents with  near syncope on 09/12 & 09/15 . These were @ 1 pm & 3 pm , both pre meals.  The patient reports lightheadedness, but denies frank  loss of consciousness,triggers , premonitory symptoms, palpitations, chest pain, shortness of breath, and incontinence.  Associated symptoms include feeling warm , chilling and diaphoresis.  The patient denies the following symptoms: headache, abdominal discomfort, nausea, vomiting, pallor, focal weakness, and perioral numbness.  The patient reports  no  precipitating factors.  The  symptoms  occurred in the setting of sitting position.  Symptoms lasted 20 minutes ; symptoms improved  with  food intake.FBS  this am was 111; 2 hrs post meal was 93 last night. A1c was 5.7 % & TSH 0.26 in 08/2009.  Current Medications (verified): 1)  Paxil 40 Mg  Tabs (Paroxetine Hcl) .Marland Kitchen.. 1 By Mouth Qam 2)  Levoxyl 100 Mcg  Tabs (Levothyroxine Sodium) .Marland Kitchen.. 1 By Mouth Once Daily Except 1 & 1/2 T & Sat 3)  Metformin Hcl 500 Mg Xr24h-Tab (Metformin Hcl) .Marland Kitchen.. 1 Pill With  Largest Meal 4)  Aspirin 81 Mg Tbec (Aspirin) .... Take 1 Tab Once Daily 5)  Zaleplon 10 Mg Caps (Zaleplon) .Marland Kitchen.. 1 By Mouth At Bedtime 6)  Geodon 60 Mg Caps (Ziprasidone Hcl) .Marland Kitchen.. 1 By Mouth Two Times A Day 7)  Loratadine 10 Mg Tabs  (Loratadine) .Marland Kitchen.. 1 Once Daily As Needed For Allergies 8)  Fluocinonide 0.05 % Oint (Fluocinonide) .... Apply Two Times A Day To Rash As Needed  Allergies (verified): No Known Drug Allergies  Review of Systems Resp:  Complains of hypersomnolence. Psych:  Complains of depression; Geodon dose adjusted by Dr Loralie Champagne office 09/08.Marland Kitchen  Physical Exam  General:  Slughtly tremulous but in no acute distress; alert,appropriate and cooperative throughout examination Eyes:  No corneal or conjunctival inflammation noted. EOMI. Perrla. Field of  Vision grossly normal. Lungs:  Normal respiratory effort, chest expands symmetrically. Lungs are clear to auscultation, no crackles or wheezes. Heart:  Normal rate and regular rhythm. S1 and S2 normal without gallop, murmur, click, rub or other extra sounds. Pulses:  R and L carotid pulses are full and equal bilaterally w/o bruits Neurologic:  alert & oriented X3, cranial nerves II-XII intact, strength normal in all extremities, gait normal, DTRs symmetrical and normal, finger-to-nose with fine tremor, and Romberg negative.   Skin:  Intact without suspicious lesions or rashes Psych:  not anxious appearing, flat affect, and subdued.     Impression & Recommendations:  Problem # 1:  DIZZINESS (ICD-780.4)  ?  hypoglycemia  Her updated medication list for this problem includes:    Loratadine 10 Mg Tabs (Loratadine) .Marland Kitchen... 1 once daily as needed for allergies  Orders: EKG w/ Interpretation (93000) Venipuncture (16109) Specimen Handling (60454)  Problem # 2:  NONSPECIFIC ABNORMAL ELECTROCARDIOGRAM (ICD-794.31)  NS ST-T changes , short PR interval. Similar changes noted in 06/2007. & also on outside EKG 2006.  Orders: EKG w/ Interpretation (93000)  Problem # 3:  DIAB W/O COMP TYPE II/UNS NOT STATED UNCNTRL (ICD-250.00)  Her updated medication list for this problem includes:    Metformin Hcl 500 Mg Xr24h-tab (Metformin hcl) .Marland Kitchen... 1 pill with  largest  meal    Aspirin 81 Mg Tbec (Aspirin) .Marland Kitchen... Take 1 tab once daily  Orders: Venipuncture (09811) Specimen Handling (91478)  Problem # 4:  HYPOTHYROIDISM (ICD-244.9)  Orders: Venipuncture (29562)  Complete Medication List: 1)  Paxil 40 Mg Tabs (Paroxetine hcl) .Marland Kitchen.. 1 by mouth qam 2)  Levoxyl 100 Mcg Tabs (levothyroxine Sodium)  .Marland Kitchen.. 1 by mouth once daily except 1 & 1/2 t & sat 3)  Metformin Hcl 500 Mg Xr24h-tab (Metformin hcl) .Marland Kitchen.. 1 pill with  largest meal 4)  Aspirin 81 Mg Tbec (Aspirin) .... Take 1 tab once daily 5)  Zaleplon 10 Mg Caps (Zaleplon) .Marland Kitchen.. 1 by mouth at bedtime 6)  Geodon 60 Mg Caps (Ziprasidone hcl) .Marland Kitchen.. 1 by mouth two times a day 7)  Loratadine 10 Mg Tabs (Loratadine) .Marland Kitchen.. 1 once daily as needed for allergies 8)  Fluocinonide 0.05 % Oint (Fluocinonide) .... Apply two times a day to rash as needed  Patient Instructions: 1)  Check your blood sugars regularly. If your readings are usually above : 150 or below 90 and > 180  two hrs after largest  meal you should contact our office. Take 15 grams of soy protein with b'fast & lunch .Hold Metformin until lab results return.Marland Kitchen

## 2010-03-23 NOTE — Assessment & Plan Note (Signed)
Summary: 4 MTH FU/NS/KDC   Vital Signs:  Patient profile:   55 year old female Weight:      186.0 pounds Pulse rate:   60 / minute Resp:     16 per minute BP sitting:   122 / 80  (left arm) Cuff size:   large  Vitals Entered By: Shonna Chock (March 14, 2009 2:50 PM) CC: Follow-up visit: copy of labs given Comments REVIEWED MED LIST, PATIENT AGREED DOSE AND INSTRUCTION CORRECT    Primary Care Provider:  Alwyn Ren  CC:  Follow-up visit: copy of labs given.  History of Present Illness: Labs reviewed & risks discussed ; LDL up from 167 to 200 but TG has decreased from 197 to 178 on "decreased sweets" SINCE 12 /2010, ie after holidays. During holidays she was binge eating. Significant elevation of LFTs ( AST  was 82 , now 149 & ALT was 95, now 136).No excess vitamin A, Tylenol or alcohol. Not on statin; no CVE  . A1c  still  in NON Diabetes range @ 6% but up from 5.6%. Weight up 10#. Not checking glucoses.  Allergies (verified): No Known Drug Allergies  Past History:  Past Medical History: Bipolar Disorder, Dr Andee Poles IBS ADD HYPOTHYROIDISM (ICD-244.9) Hyperlipidemia: LDL  167(2615/1044), TG 197, HDL 47 Sleep Apnea  Review of Systems General:  Complains of fatigue; denies weight loss. Eyes:  Denies blurring, double vision, and vision loss-both eyes; Last exam 12 months ago, no retinopathy. CV:  Denies chest pain or discomfort, leg cramps with exertion, lightheadness, near fainting, swelling of feet, and swelling of hands. Derm:  Denies poor wound healing. Neuro:  Denies numbness and tingling. Endo:  Complains of excessive hunger, excessive thirst, and excessive urination.  Physical Exam  General:  in no acute distress; appropriate and cooperative throughout examination Eyes:  No corneal or conjunctival inflammation noted.No icterus. Perrla. Lungs:  Normal respiratory effort, chest expands symmetrically. Lungs are clear to auscultation, no crackles or  wheezes. Heart:  Normal rate and regular rhythm. S1 and S2 normal without gallop, murmur, click, rub. S4 Abdomen:  Bowel sounds positive,abdomen soft and non-tender without masses, organomegaly or hernias noted. Skin:  Intact without suspicious lesions or rashes. No jaundice Psych:  Oriented X3, memory intact for recent and remote, but  subdued.     Impression & Recommendations:  Problem # 1:  HYPERLIPIDEMIA (ICD-272.4)  Her updated medication list for this problem includes:    Welchol 3.75 Gm Pack (Colesevelam hcl) .Marland Kitchen... 1 packet once daily  Problem # 2:  NONSPEC ELEVATION OF LEVELS OF TRANSAMINASE/LDH (ICD-790.4) ? iatrogenic  Problem # 3:  DIAB W/O COMP TYPE II/UNS NOT STATED UNCNTRL (ICD-250.00)  The following medications were removed from the medication list:    Metformin Hcl 500 Mg Tabs (Metformin hcl) .Marland Kitchen... Take 2 tab two times a day Her updated medication list for this problem includes:    Metformin Hcl 500 Mg Xr24h-tab (Metformin hcl) .Marland Kitchen... 1 two times a day with 2 largest meals    Aspirin 81 Mg Tbec (Aspirin) .Marland Kitchen... Take 1 tab once daily  Complete Medication List: 1)  Paxil 40 Mg Tabs (Paroxetine hcl) .Marland Kitchen.. 1 by mouth qam 2)  Levoxyl 100 Mcg Tabs (levothyroxine Sodium)  .Marland Kitchen.. 1 by mouth once daily except 1 & 1/2 sun 3)  Abilify 10 Mg Tabs (Aripiprazole) .... Take 1 tab once daily 4)  Zegerid Otc 20-1100 Mg Caps (Omeprazole-sodium bicarbonate) .... Take 1 tab once daily 5)  Metformin Hcl 500 Mg  Xr24h-tab (Metformin hcl) .Marland Kitchen.. 1 two times a day with 2 largest meals 6)  Seroquel 100 Mg Tabs (Quetiapine fumarate) .... Take 1 1/2 once daily 7)  Aspirin 81 Mg Tbec (Aspirin) .... Take 1 tab once daily 8)  Welchol 3.75 Gm Pack (Colesevelam hcl) .Marland Kitchen.. 1 packet once daily  Patient Instructions: 1)  Avoid High Fructose Corn Syrup as #1,2 or #3  on label. Consider [employer]  low carb program ,except do not restrict natural  sugar in fruits. 2)  Please schedule a follow-up  appointment in 3 months. 3)  Hepatic Panel prior to visit, ICD-9:790.4 4)  Lipid Panel prior to visit, ICD-9:272.4 5)  TSH prior to visit, ICD-9:244.9 Prescriptions: LEVOXYL 100 MCG  TABS (LEVOTHYROXINE SODIUM) 1 by mouth once daily except 1 & 1/2 Sun  #90 x 1   Entered and Authorized by:   Marga Melnick MD   Signed by:   Marga Melnick MD on 03/14/2009   Method used:   Print then Give to Patient   RxID:   [id number] [facility] 3.75 GM PACK (COLESEVELAM HCL) 1 packet once daily  #30 x 5   Entered and Authorized by:   Marga Melnick MD   Signed by:   Marga Melnick MD on 03/14/2009   Method used:   Print then Give to Patient   RxID:   [id number] METFORMIN HCL 500 MG XR24H-TAB (METFORMIN HCL) 1 two times a day with 2 largest meals  #180 x 1   Entered and Authorized by:   Marga Melnick MD   Signed by:   Marga Melnick MD on 03/14/2009   Method used:   Print then Give to Patient   RxID:   [id number]

## 2010-03-23 NOTE — Progress Notes (Signed)
Summary: List of Concerns Brought by Patient  List of Concerns Brought by Patient   Imported By: Lanelle Bal 11/14/2009 08:58:53  _____________________________________________________________________  External Attachment:    Type:   Image     Comment:   External Document

## 2010-03-23 NOTE — Letter (Signed)
Summary: Ollen Gross PhD Clinical Psychology  Ollen Gross PhD Clinical Psychology   Imported By: Lanelle Bal 04/12/2009 11:16:28  _____________________________________________________________________  External Attachment:    Type:   Image     Comment:   External Document

## 2010-03-23 NOTE — Progress Notes (Signed)
Summary: Refill Request  Phone Note Refill Request Call back at [phone] Message from:  Pharmacy on December 28, 2009 10:29 AM  Refills Requested: Medication #1:  LEVOXYL 100 MCG  TABS (LEVOTHYROXINE SODIUM) 1 by mouth once daily except 1 & 1/2 Weds only   Dosage confirmed as above?Dosage Confirmed   Brand Name Necessary? No   Supply Requested: 1 month   [facility] [address]  Next Appointment Scheduled: 11.15.11 Initial call taken by: Harold Barban,  December 28, 2009 10:29 AM  Follow-up for Phone Call        COPIED FROM 11/04/2009 LAB APPEND  TSH (thyroid ) is too low , goal = 1-3 ideally. DECREASE Levoxyl to 1 pill once daily EXCEPT 1& 1/2 on Weds only. Recheck TSH in 10 weeks( 244.9). A1c is in NON Diabetes range; the nutrition changes you have made are working ! STOP Metformin. Recheck A1c in 10 weeks also (790.29). [facility]opp Follow-up by: Shonna Chock CMA,  December 28, 2009 11:39 AM    Prescriptions: LEVOXYL 100 MCG  TABS (LEVOTHYROXINE SODIUM) 1 by mouth once daily except 1 & 1/2 Weds only  #34 x 0   Entered by:   Shonna Chock CMA   Authorized by:   Marga Melnick MD   Signed by:   Shonna Chock CMA on 12/28/2009   Method used:   [facility]axed to ...       [facility] [address] (retail)       [address]       [place], [place]  [address]       Ph: [phone] or [phone]       Fax: [phone]   RxID:   [id number]

## 2010-03-23 NOTE — Medication Information (Signed)
Summary: Nonadherence with Metformin/[employer]  Nonadherence with Metformin/[employer]   Imported By: Lanelle Bal 02/15/2010 08:14:10  _____________________________________________________________________  External Attachment:    Type:   Image     Comment:   External Document

## 2010-05-08 LAB — GLUCOSE, CAPILLARY
Glucose-Capillary: 80 mg/dL (ref 70–99)
Glucose-Capillary: 95 mg/dL (ref 70–99)

## 2010-07-20 ENCOUNTER — Other Ambulatory Visit: Payer: Self-pay | Admitting: Otolaryngology

## 2010-07-20 DIAGNOSIS — H905 Unspecified sensorineural hearing loss: Secondary | ICD-10-CM

## 2010-07-28 ENCOUNTER — Ambulatory Visit
Admission: RE | Admit: 2010-07-28 | Discharge: 2010-07-28 | Disposition: A | Discharge status: Home / Self Care | Payer: 59 | Source: Ambulatory Visit | Attending: Otolaryngology | Admitting: Otolaryngology

## 2010-07-28 DIAGNOSIS — H905 Unspecified sensorineural hearing loss: Secondary | ICD-10-CM

## 2010-08-03 ENCOUNTER — Ambulatory Visit: Admission: RE | Admit: 2010-08-03 | Payer: 59 | Source: Ambulatory Visit

## 2010-08-03 ENCOUNTER — Other Ambulatory Visit: Payer: Self-pay | Admitting: Otolaryngology

## 2010-08-03 ENCOUNTER — Ambulatory Visit
Admission: RE | Admit: 2010-08-03 | Discharge: 2010-08-03 | Disposition: A | Discharge status: Home / Self Care | Payer: 59 | Source: Ambulatory Visit | Attending: Otolaryngology | Admitting: Otolaryngology

## 2010-09-21 ENCOUNTER — Other Ambulatory Visit: Payer: Self-pay | Admitting: *Deleted

## 2010-09-21 DIAGNOSIS — N6009 Solitary cyst of unspecified breast: Secondary | ICD-10-CM

## 2010-10-05 ENCOUNTER — Encounter: Payer: Self-pay | Admitting: Gynecology

## 2010-10-19 DIAGNOSIS — F988 Other specified behavioral and emotional disorders with onset usually occurring in childhood and adolescence: Secondary | ICD-10-CM | POA: Insufficient documentation

## 2010-10-19 DIAGNOSIS — E785 Hyperlipidemia, unspecified: Secondary | ICD-10-CM | POA: Insufficient documentation

## 2010-10-19 DIAGNOSIS — C801 Malignant (primary) neoplasm, unspecified: Secondary | ICD-10-CM | POA: Insufficient documentation

## 2010-10-19 DIAGNOSIS — E28319 Asymptomatic premature menopause: Secondary | ICD-10-CM | POA: Insufficient documentation

## 2010-10-19 DIAGNOSIS — F319 Bipolar disorder, unspecified: Secondary | ICD-10-CM | POA: Insufficient documentation

## 2010-10-19 DIAGNOSIS — F329 Major depressive disorder, single episode, unspecified: Secondary | ICD-10-CM | POA: Insufficient documentation

## 2010-10-19 DIAGNOSIS — E039 Hypothyroidism, unspecified: Secondary | ICD-10-CM | POA: Insufficient documentation

## 2010-10-19 DIAGNOSIS — F419 Anxiety disorder, unspecified: Secondary | ICD-10-CM | POA: Insufficient documentation

## 2010-10-25 ENCOUNTER — Encounter: Payer: 59 | Admitting: Gynecology

## 2010-10-26 ENCOUNTER — Encounter: Payer: 59 | Admitting: Gynecology

## 2010-11-08 ENCOUNTER — Other Ambulatory Visit (HOSPITAL_COMMUNITY)
Admission: RE | Admit: 2010-11-08 | Discharge: 2010-11-08 | Disposition: A | Discharge status: Home / Self Care | Payer: 59 | Source: Ambulatory Visit | Attending: Gynecology | Admitting: Gynecology

## 2010-11-08 ENCOUNTER — Ambulatory Visit (INDEPENDENT_AMBULATORY_CARE_PROVIDER_SITE_OTHER): Payer: 59 | Admitting: Gynecology

## 2010-11-08 ENCOUNTER — Encounter: Payer: Self-pay | Admitting: Gynecology

## 2010-11-08 VITALS — BP 130/70 | Ht 61.0 in | Wt 199.0 lb

## 2010-11-08 DIAGNOSIS — C50911 Malignant neoplasm of unspecified site of right female breast: Secondary | ICD-10-CM

## 2010-11-08 DIAGNOSIS — N9089 Other specified noninflammatory disorders of vulva and perineum: Secondary | ICD-10-CM

## 2010-11-08 DIAGNOSIS — C50919 Malignant neoplasm of unspecified site of unspecified female breast: Secondary | ICD-10-CM

## 2010-11-08 DIAGNOSIS — Z01419 Encounter for gynecological examination (general) (routine) without abnormal findings: Secondary | ICD-10-CM | POA: Insufficient documentation

## 2010-11-08 NOTE — Progress Notes (Signed)
[name] [facility] 30-Mar-1955 [id number]        55 y.o.  for annual exam.  Doing well from a gynecologic standpoint. She does note a bump of the last several weeks on her right labia she wants me to look at.  Past medical history,surgical history, medications, allergies, family history and social history were all reviewed and documented in the EPIC chart. ROS:  Was performed and pertinent positives and negatives are included in the history.  Exam: chaperone present Filed Vitals:   11/08/10 1008  BP: 130/70   General appearance  Normal Skin grossly normal Head/Neck normal with no cervical or supraclavicular adenopathy thyroid normal Lungs  clear Cardiac RR, without RMG Abdominal  soft, nontender, without masses, organomegaly or hernia Breasts  examined lying and sitting.  Left  without masses, retractions, discharge or axillary adenopathy.  Right status post lumpectomy radiation changes smaller than left breast without masses retractions discharge axillary adenopathy. Pelvic  Ext/BUS/vagina  Normal, mild atrophic changes, with small classic appearing angioma right mid labia majora  Cervix  normal  Pap done  Uterus  anteverted, normal size, shape and contour, midline and mobile nontender   Adnexa  Without masses or tenderness    Anus and perineum  normal   Rectovaginal  normal sphincter tone without palpated masses or tenderness.    Assessment/Plan:  55 y.o. female for annual exam.    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 Options of excision versus observation reviewed with the patient patient's comfortable with watching and will follow this. If any changes to represent for excision. #2 History of breast cancer. Recently had followup mammogram ultrasound last month. We'll continue with the screening is recommended by her oncologist. #3 Health maintenance. Patient being followed for a number of issues per her medical history. Routinely sees Dr.  Alwyn Ren. No blood work was done was all done through his office. She's unsure when her last colonoscopy was recommended she call make sure she's not doing will follow up with them in reference to this. Her bone density last year showed osteopenia we'll repeat this next year. She is on extra calcium vitamin D. Assuming she continues well from a GYN standpoint she will see [place] in a year sooner as needed    Dara Lords MD, 10:38 AM 11/08/2010

## 2011-01-23 ENCOUNTER — Ambulatory Visit (INDEPENDENT_AMBULATORY_CARE_PROVIDER_SITE_OTHER): Payer: 59 | Admitting: Internal Medicine

## 2011-01-23 ENCOUNTER — Encounter: Payer: Self-pay | Admitting: Internal Medicine

## 2011-01-23 VITALS — BP 118/76 | HR 77 | Wt 200.2 lb

## 2011-01-23 DIAGNOSIS — E039 Hypothyroidism, unspecified: Secondary | ICD-10-CM

## 2011-01-23 MED ORDER — LEVOTHYROXINE SODIUM 100 MCG PO TABS: ORAL_TABLET | ORAL | Status: DC

## 2011-01-23 NOTE — Progress Notes (Signed)
  Subjective:    Patient ID: [name], female    DOB: Jan 18, 1956, 55 y.o.   MRN: [id number]  HPI Thyroid function monitor  Medications status(change in dose/brand/mode of administration):no Constitutional: Weight change: no; Fatigue:yes, ?from new job; Sleep pattern:chronic, intermittent insomnia; Appetite:wonderful  Visual change(blurred/diplopia/visual loss):some blurring Hoarseness:no; Swallowing issues:no Cardiovascular: Palpitations:no; Racing:no; Irregularity:no GI:  Alternating constipation & loose stool Derm: Change in nails/hair/skin:no Neuro: Numbness/tingling:no; Tremor:no Psych: Anxiety:with new job; Depression:improved with job; Panic attacks:no Endo: Temperature intolerance: Heat:yes; Cold:no. Labs: TSH was reduced at 0.346; T4 (not free T4) was 14.7. Her lithium level was therapeutic at 0.8.         Review of Systems     Objective:   Physical Exam  Gen.:  well-nourished; in no acute distress Eyes: Extraocular motion intact; no lid lag or proptosis Neck: thyroid normal Heart: Normal rhythm and rate without significant murmur, gallop, or extra heart sounds Lungs: Chest clear to auscultation without rales,rales, wheezes Neuro:Deep tendon reflexes are equal and within normal limits; no tremor  Skin: Warm and dry without significant lesions or rashes; no onycholysis Psych: Normally communicative and interactive; no abnormal mood or affect clinically.        Assessment & Plan:  #1 abnormal thyroid function values; excess thyroid supplementation suggested. The T4, but not a free T4, would be affected by her lithium.  Plan: I will be decreased and a free T4 and TSH checked after 8-10 weeks.

## 2011-01-23 NOTE — Patient Instructions (Signed)
Please  schedule  Labs in 10 weeks : FREE T4, TSH. PLEASE BRING THESE INSTRUCTIONS TO FOLLOW UP  LAB APPOINTMENT.This will guarantee correct labs are drawn, eliminating need for repeat blood sampling ( needle sticks ! ). Diagnoses /Codes: 244.9

## 2011-02-20 HISTORY — PX: FOOT SURGERY: SHX648

## 2011-03-20 ENCOUNTER — Other Ambulatory Visit: Payer: Self-pay | Admitting: *Deleted

## 2011-03-20 DIAGNOSIS — Z09 Encounter for follow-up examination after completed treatment for conditions other than malignant neoplasm: Secondary | ICD-10-CM

## 2011-03-27 ENCOUNTER — Other Ambulatory Visit: Payer: Self-pay | Admitting: Gynecology

## 2011-03-27 DIAGNOSIS — Z09 Encounter for follow-up examination after completed treatment for conditions other than malignant neoplasm: Secondary | ICD-10-CM

## 2011-06-25 ENCOUNTER — Telehealth: Payer: Self-pay | Admitting: Internal Medicine

## 2011-06-25 DIAGNOSIS — E039 Hypothyroidism, unspecified: Secondary | ICD-10-CM

## 2011-06-25 NOTE — Telephone Encounter (Signed)
Refill requested for  ROPINIRole HCl (Tab) REQUIP 1 MG Take 1 mg by mouth at bedtime. -3 hrs before sleep  Not prescribed by Alwyn Ren before but is on meds list  Last office visit 12.4.12 notes say Please schedule Labs in 10 weeks : FREE T4, TSH. PLEASE BRING THESE INSTRUCTIONS TO FOLLOW UP LAB APPOINTMENT.This will guarantee correct labs are drawn, eliminating need for repeat blood sampling ( needle sticks ! ). Diagnoses /Codes: 244.9   Patient wants to come here for labs & if Dr.Hopper will not call in medication above for her, I can make her an appointment for that as well. Please review and advise Thanks Patient ph# [phone]

## 2011-06-26 ENCOUNTER — Telehealth: Payer: Self-pay | Admitting: Internal Medicine

## 2011-06-26 MED ORDER — ROPINIROLE HCL 1 MG PO TABS: 1.0000 mg | ORAL_TABLET | Freq: Every day | ORAL | Status: DC

## 2011-06-26 NOTE — Telephone Encounter (Signed)
OK X 3 mos 

## 2011-06-26 NOTE — Telephone Encounter (Signed)
Appointment made

## 2011-06-26 NOTE — Telephone Encounter (Signed)
Dr.Hopper please advise on refill request, med never rx'ed by you before

## 2011-06-26 NOTE — Telephone Encounter (Signed)
Rx already sent to pharmacy, Pt aware.

## 2011-06-26 NOTE — Telephone Encounter (Signed)
Caller: Shifa/Patient is calling with a Medication Question re: Ropinrole 1 mg PO -3 hours before sleep - takes for Restless Leg Sydrome. The medication was written by Dr. Nolen Mu, but she is now going to [facility] and they refilled her Psych meds but not the Ropinirole. She is wondering if Dr. Alwyn Ren can take over refilling this medication. It was originally prescribed to help her sleep and since she is out of med she is having trouble sleeping  again and with restless legs. . [phone]. PLEASE ASK PHARMACY TO CALL HER WHEN MED READY FOR PICK UP.

## 2011-06-26 NOTE — Telephone Encounter (Signed)
RX sent, Future orders placed. Ok to schedule patient for lab appointment (ok to sign encounter once appointment scheduled)

## 2011-07-02 ENCOUNTER — Other Ambulatory Visit (INDEPENDENT_AMBULATORY_CARE_PROVIDER_SITE_OTHER): Payer: 59

## 2011-07-02 DIAGNOSIS — E039 Hypothyroidism, unspecified: Secondary | ICD-10-CM

## 2011-07-03 LAB — T4, FREE: Free T4: 0.89 ng/dL (ref 0.60–1.60)

## 2011-07-24 ENCOUNTER — Encounter: Payer: Self-pay | Admitting: Internal Medicine

## 2011-07-24 ENCOUNTER — Ambulatory Visit (INDEPENDENT_AMBULATORY_CARE_PROVIDER_SITE_OTHER): Payer: BC Managed Care – PPO | Admitting: Internal Medicine

## 2011-07-24 VITALS — BP 118/76 | HR 68 | Temp 98.6°F | Wt 191.0 lb

## 2011-07-24 DIAGNOSIS — M542 Cervicalgia: Secondary | ICD-10-CM

## 2011-07-24 MED ORDER — CELECOXIB 200 MG PO CAPS: 200.0000 mg | ORAL_CAPSULE | Freq: Two times a day (BID) | ORAL | Status: AC

## 2011-07-24 MED ORDER — CYCLOBENZAPRINE HCL 5 MG PO TABS: ORAL_TABLET | ORAL | Status: DC

## 2011-07-24 NOTE — Progress Notes (Signed)
  Subjective:    Patient ID: [name], female    DOB: 07-Jul-1955, 56 y.o.   MRN: [id number]  HPI NECK PAIN: Location: L   Onset: 3-4 weeks ago   Severity: 5/10-10/10 Pain is described as: intermittent aching, sometimes shooting  Worse with: lying down at night   Better with: nothing, ibuprofen not helpful Pain radiates to: L occiput   Impaired range of motion: when turning head to left  History of repetitive motion:  no History of overuse or hyperextension:  no History of trauma:  no Past history of similar problem:  no  Review of Systems  Back Pain:  no Numbness/tingling:  no Weakness:  no  Red Flags Fever:  no Headache:  no  Bowel/bladder dysfunction:  no    Objective:   Physical Exam  There is decreased range of motion laterally of the neck.  There is  full range of motion without decreased strength or tone in upper extremities No rash is present in the area of  discomfort.  She finds massage to be a positive experience ratherthan eliciting pain in the area of the discomfort  There is no lymphadenopathy about the neck or axilla.  DTRs are normal  Cranial nerve exam is negative         Assessment & Plan:  #1 cervical myalgia with no neuromuscular deficit  Plan: Her options were reviewed. These include physical therapy, chiropractory, and/or massage therapy. She'll be given samples of Celebrex and a prescription written for a muscle relaxant

## 2011-07-24 NOTE — Patient Instructions (Signed)
Use a cervical memory foam pillow to prevent hyperextension or hyperflexion of the cervical spine. Massage therapy for the upper back and neck muscles would be of benefit and would be medically indicated . Use an anti-inflammatory cream such as Aspercreme or Zostrix cream twice a day to the left neck as needed. In lieu of this warm moist compresses or  hot water bottle can be used. Do not apply ice

## 2011-08-21 ENCOUNTER — Other Ambulatory Visit: Payer: Self-pay | Admitting: Internal Medicine

## 2011-09-17 ENCOUNTER — Encounter: Payer: Self-pay | Admitting: Family Medicine

## 2011-09-17 ENCOUNTER — Telehealth: Payer: Self-pay | Admitting: Internal Medicine

## 2011-09-17 ENCOUNTER — Ambulatory Visit (HOSPITAL_BASED_OUTPATIENT_CLINIC_OR_DEPARTMENT_OTHER)
Admission: RE | Admit: 2011-09-17 | Discharge: 2011-09-17 | Disposition: A | Discharge status: Home / Self Care | Payer: BC Managed Care – PPO | Source: Ambulatory Visit | Attending: Family Medicine | Admitting: Family Medicine

## 2011-09-17 ENCOUNTER — Ambulatory Visit (INDEPENDENT_AMBULATORY_CARE_PROVIDER_SITE_OTHER): Payer: BC Managed Care – PPO | Admitting: Family Medicine

## 2011-09-17 VITALS — BP 124/77 | HR 90 | Temp 98.2°F | Ht 60.25 in | Wt 190.4 lb

## 2011-09-17 DIAGNOSIS — R42 Dizziness and giddiness: Secondary | ICD-10-CM | POA: Insufficient documentation

## 2011-09-17 DIAGNOSIS — R279 Unspecified lack of coordination: Secondary | ICD-10-CM

## 2011-09-17 DIAGNOSIS — R278 Other lack of coordination: Secondary | ICD-10-CM | POA: Insufficient documentation

## 2011-09-17 DIAGNOSIS — Z79899 Other long term (current) drug therapy: Secondary | ICD-10-CM

## 2011-09-17 DIAGNOSIS — G319 Degenerative disease of nervous system, unspecified: Secondary | ICD-10-CM | POA: Insufficient documentation

## 2011-09-17 DIAGNOSIS — I679 Cerebrovascular disease, unspecified: Secondary | ICD-10-CM | POA: Insufficient documentation

## 2011-09-17 NOTE — Patient Instructions (Addendum)
We will notify you of your lab results and your CT results If your symptoms change or worsen- please call or go to the ER Hang in there!!!

## 2011-09-17 NOTE — Assessment & Plan Note (Signed)
New.  Suspect Li toxicity but must r/o intracranial process.  Check CT, labs.  No meds at this time as this might exacerbate problem.  If labs and CT normal will refer to neuro.

## 2011-09-17 NOTE — Telephone Encounter (Signed)
Caller: Talah/Patient; PCP: Marga Melnick; CB#: [phone]. Call regarding Vertigo that began early last week and persists today. Some nausea last week that has since resolved. No congestion or sxs of illness. Per Dizziness or Vertigo Protocol, see in 24 hrs, appt scheduled for today, Monday 7/29 at 16:00 with Dr Beverely Low. Caller is agreeable.

## 2011-09-17 NOTE — Assessment & Plan Note (Signed)
New.  Again suspect Li toxicity but will get additional labs to r/o thyroid, anemia, metabolic abnormality.  No meclizine to avoid increased lethargy.  If labs and CT unrevealing will need neuro referral.

## 2011-09-17 NOTE — Assessment & Plan Note (Signed)
Check labs to r/o toxicity.  Pt unable to tell me name of psychiatrist that she is seeing

## 2011-09-17 NOTE — Progress Notes (Signed)
  Subjective:    Patient ID: [name], female    DOB: 1955-05-13, 56 y.o.   MRN: [id number]  HPI Dizziness- pt denies hx of similar but it is listed on her problem list.  sxs started 'sometime last week'.  sxs occur w/ changing position.  Doesn't occur w/ turning head.  No nausea.  No sinus pain/pressure.  Has sensation of water in L ear.  No fevers.  Husband reports short term memory loss- pt mentioned it to nurse but did not mention this to me until directly questioned, 'i forgot'.  Pt states 'i don't know what he's talking about'.  'then there's that silly email i sent him last night'.  Pt sent husband an email in the middle of night (has been sleeping in guest room) but when husband sent email back to pt, 'it made no sense'.  Vaguely remembers sending an email and thinking 'this is taking forever'.  Not currently on any sleep aides but reports she feels like she's 'on Ambien- you know, doing stuff you're not aware of'.  Has not had Li level checked recently, thyroid checked in May.  Had surgery 7/2- then had vicodin and phenergan.  Can't remember the last time she took either med.   Review of Systems For ROS see HPI     Objective:   Physical Exam  Vitals reviewed. Constitutional: She appears well-developed and well-nourished.  Eyes: Conjunctivae and EOM are normal.       Pupils minimally reactive bilaterally but symmetric 3-4 beats of horizontal nystagmus bilaterally  Neck: Normal range of motion. Neck supple.  Cardiovascular: Normal rate, regular rhythm, normal heart sounds and intact distal pulses.   Pulmonary/Chest: Effort normal and breath sounds normal. No respiratory distress. She has no wheezes. She has no rales.  Lymphadenopathy:    She has no cervical adenopathy.  Neurological: She is alert. She displays abnormal reflex (very brisk reflexes- almost hyperreflexic- both upper and lower extremities). No cranial nerve deficit. Coordination (positive Romberg, unable to do tandem  gait, ataxia- almost unable to pick feet up off the floor) abnormal.  Psychiatric:       Flat, almost 'foggy'          Assessment & Plan:

## 2011-09-18 ENCOUNTER — Other Ambulatory Visit (HOSPITAL_BASED_OUTPATIENT_CLINIC_OR_DEPARTMENT_OTHER): Payer: BC Managed Care – PPO

## 2011-09-18 LAB — BASIC METABOLIC PANEL
Chloride: 106 mEq/L (ref 96–112)
Potassium: 4 mEq/L (ref 3.5–5.1)
Sodium: 141 mEq/L (ref 135–145)

## 2011-09-18 LAB — CBC WITH DIFFERENTIAL/PLATELET
Basophils Absolute: 0 10*3/uL (ref 0.0–0.1)
Basophils Relative: 0.5 % (ref 0.0–3.0)
Eosinophils Absolute: 0.4 10*3/uL (ref 0.0–0.7)
Lymphocytes Relative: 27 % (ref 12.0–46.0)
MCHC: 32.3 g/dL (ref 30.0–36.0)
Neutrophils Relative %: 58 % (ref 43.0–77.0)
Platelets: 180 10*3/uL (ref 150.0–400.0)
RBC: 4.34 Mil/uL (ref 3.87–5.11)
RDW: 14.1 % (ref 11.5–14.6)

## 2011-09-18 LAB — LITHIUM LEVEL: Lithium Lvl: 0.7 mEq/L — ABNORMAL LOW (ref 0.80–1.40)

## 2011-09-18 LAB — TSH: TSH: 2.01 u[IU]/mL (ref 0.35–5.50)

## 2011-09-18 NOTE — Addendum Note (Signed)
Addended by: Derry Lory A on: 09/18/2011 08:49 AM   Modules accepted: Orders

## 2011-09-19 ENCOUNTER — Encounter: Payer: Self-pay | Admitting: *Deleted

## 2011-09-24 ENCOUNTER — Other Ambulatory Visit: Payer: Self-pay | Admitting: Internal Medicine

## 2011-11-12 ENCOUNTER — Encounter: Payer: BC Managed Care – PPO | Admitting: Gynecology

## 2011-11-16 ENCOUNTER — Other Ambulatory Visit: Payer: Self-pay | Admitting: Internal Medicine

## 2011-11-16 ENCOUNTER — Encounter: Payer: BC Managed Care – PPO | Admitting: Gynecology

## 2011-11-16 NOTE — Telephone Encounter (Signed)
#  90 , R X 1. TSH due by 7/14

## 2011-11-16 NOTE — Telephone Encounter (Signed)
Need okay.  Plz advise     MW 

## 2011-11-16 NOTE — Telephone Encounter (Signed)
Rx sent.    MW 

## 2011-12-07 ENCOUNTER — Encounter: Payer: BC Managed Care – PPO | Admitting: Gynecology

## 2011-12-21 ENCOUNTER — Encounter: Payer: BC Managed Care – PPO | Admitting: Gynecology

## 2012-01-04 ENCOUNTER — Encounter: Payer: Self-pay | Admitting: Gynecology

## 2012-01-04 ENCOUNTER — Ambulatory Visit (INDEPENDENT_AMBULATORY_CARE_PROVIDER_SITE_OTHER): Payer: BC Managed Care – PPO | Admitting: Gynecology

## 2012-01-04 VITALS — BP 128/78 | Ht 60.25 in | Wt 194.0 lb

## 2012-01-04 DIAGNOSIS — M8580 Other specified disorders of bone density and structure, unspecified site: Secondary | ICD-10-CM

## 2012-01-04 DIAGNOSIS — C50919 Malignant neoplasm of unspecified site of unspecified female breast: Secondary | ICD-10-CM

## 2012-01-04 DIAGNOSIS — Z01419 Encounter for gynecological examination (general) (routine) without abnormal findings: Secondary | ICD-10-CM

## 2012-01-04 DIAGNOSIS — M949 Disorder of cartilage, unspecified: Secondary | ICD-10-CM

## 2012-01-04 DIAGNOSIS — N952 Postmenopausal atrophic vaginitis: Secondary | ICD-10-CM

## 2012-01-04 NOTE — Progress Notes (Signed)
[name] [facility] Jun 26, 1955 [id number]        56 y.o.  G0P0 for annual exam.    Past medical history,surgical history, medications, allergies, family history and social history were all reviewed and documented in the EPIC chart. ROS:  Was performed and pertinent positives and negatives are included in the history.  Exam: Biomedical scientist Filed Vitals:   01/04/12 1602  BP: 128/78  Height: 5' 0.25" (1.53 m)  Weight: 194 lb (87.998 kg)   General appearance  Normal Skin grossly normal Head/Neck normal with no cervical or supraclavicular adenopathy thyroid normal Lungs  clear Cardiac RR, without RMG Abdominal  soft, nontender, without masses, organomegaly or hernia Breasts  examined lying and sitting. Left without masses, retractions, discharge or axillary adenopathy.  Right status post lumpectomy/radiation changes. Without masses, discharge or adenopathy Pelvic  Ext/BUS/vagina  normal with atrophic changes  Cervix  normal   Uterus  retroverted, normal size, shape and contour, midline and mobile nontender   Adnexa  Without masses or tenderness    Anus and perineum  normal   Rectovaginal  normal sphincter tone without palpated masses or tenderness.    Assessment/Plan:  56 y.o. G0P0 female for annual exam.   1. Postmenopausal/atrophic changes. Without significant symptoms. We'll continue to monitor. 2. History of breast cancer 1989.  Mammography February 2013 normal. Continue annual mammography. SBE monthly reviewed. 3. Pap smear. No Pap smear done today. Pap smear 10/2010 normal. No history of significant abnormalities in the past. Plan every 3 year Pap smears. 4. Osteopenia. DEXA 03/2009 with T score -2.1 FRAX 4.6%/0.4% plan repeat now a two-year interval. Patient will schedule. Increase calcium vitamin D reviewed. 5. Colonoscopy up to date with planned repeat in 5 your interval. 6. Health maintenance. No blood work done as this is all done through Dr. Caryl Never office who actively follows  the patient. Follow up for DEXA otherwise one year, sooner as needed    Dara Lords MD, 4:53 PM 01/04/2012

## 2012-01-04 NOTE — Patient Instructions (Signed)
Followup for bone density as scheduled, otherwise in one year for annual exam. 

## 2012-01-05 LAB — URINALYSIS W MICROSCOPIC + REFLEX CULTURE

## 2012-01-07 ENCOUNTER — Other Ambulatory Visit: Payer: Self-pay | Admitting: Gynecology

## 2012-01-07 DIAGNOSIS — R350 Frequency of micturition: Secondary | ICD-10-CM

## 2012-01-08 ENCOUNTER — Other Ambulatory Visit: Payer: BC Managed Care – PPO

## 2012-01-11 ENCOUNTER — Other Ambulatory Visit: Payer: BC Managed Care – PPO

## 2012-01-11 DIAGNOSIS — R350 Frequency of micturition: Secondary | ICD-10-CM

## 2012-01-12 LAB — URINALYSIS W MICROSCOPIC + REFLEX CULTURE
Bacteria, UA: NONE SEEN
Bilirubin Urine: NEGATIVE
Casts: NONE SEEN
Crystals: NONE SEEN
Ketones, ur: NEGATIVE mg/dL
Nitrite: NEGATIVE
Specific Gravity, Urine: 1.021 (ref 1.005–1.030)
pH: 7.5 (ref 5.0–8.0)

## 2012-01-13 LAB — URINE CULTURE

## 2012-01-14 ENCOUNTER — Other Ambulatory Visit: Payer: Self-pay | Admitting: Gynecology

## 2012-01-14 ENCOUNTER — Telehealth: Payer: Self-pay | Admitting: Gynecology

## 2012-01-14 DIAGNOSIS — R82998 Other abnormal findings in urine: Secondary | ICD-10-CM

## 2012-01-14 NOTE — Telephone Encounter (Signed)
Tell patient that her urine had white cells in it but did not grow any specific bacteria. Ask her to repeat a clean-catch urinalysis at her convenience.

## 2012-01-24 NOTE — Telephone Encounter (Signed)
Pt was informed on 01/14/12 documented on result note by KA. KW

## 2012-02-29 ENCOUNTER — Ambulatory Visit (INDEPENDENT_AMBULATORY_CARE_PROVIDER_SITE_OTHER): Payer: BC Managed Care – PPO | Admitting: Internal Medicine

## 2012-02-29 ENCOUNTER — Encounter: Payer: Self-pay | Admitting: *Deleted

## 2012-02-29 ENCOUNTER — Encounter: Payer: Self-pay | Admitting: Lab

## 2012-02-29 ENCOUNTER — Encounter: Payer: Self-pay | Admitting: Internal Medicine

## 2012-02-29 VITALS — BP 120/82 | HR 76 | Temp 97.9°F | Wt 183.2 lb

## 2012-02-29 DIAGNOSIS — M542 Cervicalgia: Secondary | ICD-10-CM

## 2012-02-29 DIAGNOSIS — M899 Disorder of bone, unspecified: Secondary | ICD-10-CM

## 2012-02-29 DIAGNOSIS — M546 Pain in thoracic spine: Secondary | ICD-10-CM

## 2012-02-29 DIAGNOSIS — M8580 Other specified disorders of bone density and structure, unspecified site: Secondary | ICD-10-CM

## 2012-02-29 MED ORDER — TRAMADOL HCL 50 MG PO TABS: ORAL_TABLET | ORAL | Status: DC

## 2012-02-29 MED ORDER — CYCLOBENZAPRINE HCL 5 MG PO TABS: ORAL_TABLET | ORAL | Status: DC

## 2012-02-29 NOTE — Progress Notes (Signed)
  Subjective:    Patient ID: [name], female    DOB: 06/15/1955, 57 y.o.   MRN: [id number]  HPI BACK PAIN Location:R  upper back   Onset: 2 mos ago Trigger/ injury : no   Severity:up to 9 Pain is described WU:JWJX , throbbing or sharp  Worse with: sleeping   Better with: heat & ice  help; NSAIDS   ? benfit Pain radiates to: neck   Impaired range of motion:yes Repetitive motion: no  History of overuse or hyperextension:no History of trauma: no Past history of similar problem: no        Review of Systems Numbness/tingling: no Weakness:  no  Red Flags Fever/ chills/ sweats:  No Weight loss : yes due to stress Headache: no  Bowel/bladder dysfunction: no      Objective:   Physical Exam  Gen. Appearance:adequately nourished, in no distress Eyes: Extraocular motion intact, field of vision normal, vision grossly intact, no nystagmus. Slight lid lag ENT:  hearing grossly normal Neck: decreased range of motion, no masses, normal thyroid Cardiovascular: Rate and rhythm normal; no murmurs, gallops . S 4 Muscle skeletal: Accentuated curvature of upper thoracic  spine, tone, &  strength normal Neuro:no cranial nerve deficit, deep tendon  reflexes normal, gait normal. Finger to nose & Rhomberg normal Lymph: No cervical or axillary LA Skin: Warm and dry without suspicious lesions or rashes Psych: no anxiety or mood change. Normally interactive and cooperative.         Assessment & Plan:  #1 musculoskeletal back and neck pain  #2 past history of significant osteopenia with T score of -2.1 in the spine in 2011. She's overdue for followup. Additionally vitamin D should be verified Plan: See orders and recommendations

## 2012-02-29 NOTE — Patient Instructions (Addendum)
Order for x-rays entered into  the computer; these will be performed at [address] [facility] across from [facility]. No appointment is necessary.  Use an anti-inflammatory cream such as Aspercreme or Zostrix cream twice a day to the left neck as needed. In lieu of this warm moist compresses or  hot water bottle can be used. Do not apply ice .Sleep with a cervical memory foam pillow to prevent hyperextension or hyperflexion of the cervical spine.  Schedule BMD @ Dr Reynold Bowen

## 2012-03-05 ENCOUNTER — Telehealth: Payer: Self-pay | Admitting: *Deleted

## 2012-03-05 ENCOUNTER — Other Ambulatory Visit: Payer: Self-pay | Admitting: Gynecology

## 2012-03-05 DIAGNOSIS — Z1231 Encounter for screening mammogram for malignant neoplasm of breast: Secondary | ICD-10-CM

## 2012-03-05 LAB — VITAMIN D 1,25 DIHYDROXY
Vitamin D2 1, 25 (OH)2: <8 pg/mL
Vitamin D3 1, 25 (OH)2: 37 pg/mL

## 2012-03-05 NOTE — Telephone Encounter (Signed)
Notified by [facility] that urine sample received in lab was not labeled and was subsequently rejected. Patient needs to submit to another UDS at next visit.

## 2012-03-25 ENCOUNTER — Ambulatory Visit
Admission: RE | Admit: 2012-03-25 | Discharge: 2012-03-25 | Disposition: A | Discharge status: Home / Self Care | Payer: BC Managed Care – PPO | Source: Ambulatory Visit | Attending: Gynecology | Admitting: Gynecology

## 2012-03-25 DIAGNOSIS — Z1231 Encounter for screening mammogram for malignant neoplasm of breast: Secondary | ICD-10-CM

## 2012-03-26 ENCOUNTER — Other Ambulatory Visit: Payer: Self-pay | Admitting: Internal Medicine

## 2012-04-07 ENCOUNTER — Ambulatory Visit (INDEPENDENT_AMBULATORY_CARE_PROVIDER_SITE_OTHER)
Admission: RE | Admit: 2012-04-07 | Discharge: 2012-04-07 | Disposition: A | Discharge status: Home / Self Care | Payer: BC Managed Care – PPO | Source: Ambulatory Visit | Attending: Internal Medicine | Admitting: Internal Medicine

## 2012-04-07 DIAGNOSIS — M542 Cervicalgia: Secondary | ICD-10-CM

## 2012-04-28 ENCOUNTER — Ambulatory Visit: Payer: BC Managed Care – PPO | Admitting: Internal Medicine

## 2012-04-28 ENCOUNTER — Other Ambulatory Visit (INDEPENDENT_AMBULATORY_CARE_PROVIDER_SITE_OTHER): Payer: BC Managed Care – PPO

## 2012-04-28 DIAGNOSIS — E039 Hypothyroidism, unspecified: Secondary | ICD-10-CM

## 2012-04-28 DIAGNOSIS — Z79899 Other long term (current) drug therapy: Secondary | ICD-10-CM

## 2012-04-28 NOTE — Progress Notes (Signed)
Labs only

## 2012-04-29 LAB — CBC WITH DIFFERENTIAL/PLATELET
Basophils Absolute: 0 10*3/uL (ref 0.0–0.1)
Eosinophils Absolute: 0.3 10*3/uL (ref 0.0–0.7)
Lymphocytes Relative: 17.1 % (ref 12.0–46.0)
MCHC: 31.8 g/dL (ref 30.0–36.0)
Neutrophils Relative %: 71.3 % (ref 43.0–77.0)
Platelets: 232 10*3/uL (ref 150.0–400.0)
RBC: 4.77 Mil/uL (ref 3.87–5.11)
RDW: 14 % (ref 11.5–14.6)

## 2012-04-29 LAB — BASIC METABOLIC PANEL
Calcium: 9.4 mg/dL (ref 8.4–10.5)
GFR: 84.11 mL/min (ref >60.00)
Glucose, Bld: 84 mg/dL (ref 70–99)
Sodium: 136 mEq/L (ref 135–145)

## 2012-04-29 LAB — TSH: TSH: 3.82 u[IU]/mL (ref 0.35–5.50)

## 2012-04-30 ENCOUNTER — Ambulatory Visit (INDEPENDENT_AMBULATORY_CARE_PROVIDER_SITE_OTHER): Payer: BC Managed Care – PPO | Admitting: Internal Medicine

## 2012-04-30 VITALS — BP 122/78 | HR 83 | Temp 98.6°F | Wt 174.0 lb

## 2012-04-30 DIAGNOSIS — M542 Cervicalgia: Secondary | ICD-10-CM

## 2012-04-30 DIAGNOSIS — K589 Irritable bowel syndrome without diarrhea: Secondary | ICD-10-CM

## 2012-04-30 NOTE — Progress Notes (Signed)
Subjective:    Patient ID: [name], female    DOB: Jan 15, 1956, 57 y.o.   MRN: [id number]  HPI #1 since October 2013 she's had nausea associated with decreased appetite and decreased oral intake. She describes loose to watery stools almost daily. This is associated with cramping abdominal discomfort. She believes his symptoms are related to her medications. No treatment.  She denies any exposures related travel, suspicious foods, sick individuals, or recent antibiotics.  She's actually lost 25 pounds since August 2013.  She has a past history of esophageal stricture. She denies dysphagia, melena, rectal bleeding.  She had extensive labs done 04/28/12. CBC and differential, chemistries, TSH, and lithium level were normal or therapeutic.  #2 She has had neck & R upper back pain since 12/2011.Onset  in context of  trauma after falling out bed & over a step stool. Pain described as dull - throbbing aching  & worse @ night.Pain lasts hours through night. Exacerbating factors include posterior neck rotation & exogenous stress. Pain improved with cervical pillow.Prescription pain medication  & topical cream also helped. Cervical spine films 04/07/12 revealed evidence of chronic multilevel cervical disc degeneration with no acute bony abnormality. There is straightening of the cervical lordosis.          Review of Systems Two episodes of  fecal incontinence after flatus. One-2 episodes of urinary incontinence in past several months No dysuria/ pyuria/hematuria No rash in area of pain No numbness/tingling or upper or lower extremity weakness No fever/chills/sweats        Objective:   Physical Exam Gen.: Adequately nourished in appearance. Alert, appropriate and cooperative throughout exam.  Head: Normocephalic without obvious abnormalities  Eyes: No corneal or conjunctival inflammation noted. No icterus. Mouth: Oral mucosa and oropharynx reveal no lesions or exudates. Teeth in  good repair. Neck: No deformities, masses, or tenderness noted. Range of motion markedly decreased. Thyroid normal. Lungs: Normal respiratory effort; chest expands symmetrically. Lungs are clear to auscultation without rales, wheezes, or increased work of breathing. Heart: Normal rate and rhythm. Normal S1 and S2. No gallop, click, or rub. S4 w/o murmur. Abdomen: Bowel sounds normal; abdomen soft and nontender. No masses, organomegaly or hernias noted.Dullness RUQ                              Musculoskeletal/extremities: Accentuated curvature of upper thoracic  Spine. No clubbing, cyanosis, edema, or significant extremity  deformity noted. Range of motion normal .Tone & strength normal to opposition. Joints normal. Nail health good. Able to lie down & sit up w/o help. Negative SLR bilaterally Vascular: Carotid, radial artery, dorsalis pedis and  posterior tibial pulses are full and equal. No bruits present. Neurologic: Alert and oriented x3. Deep tendon reflexes symmetrical and normal.           Skin: Intact without suspicious lesions or rashes. Lymph: No cervical, axillary lymphadenopathy present. Psych: Mood and affect are normal. Normally interactive  Assessment & Plan:  #1 bowel changes with signs and symptoms of IBS  #2 muscle skeletal neck pain; no neuromuscular deficit present  Plan: See orders and recommendations

## 2012-04-30 NOTE — Patient Instructions (Addendum)
Please take the probiotic , Align, every day until the bowels are normal. This will replace the normal bacteria which  are necessary for formation of normal stool and processing of food. Immodium AD as needed for frank diarrhea    Use an anti-inflammatory cream such as Aspercreme or Zostrix cream twice a day to the neck as needed. In lieu of this warm moist compresses or  hot water bottle can be used. Do not apply ice.Use a cervical memory foam pillow to prevent hyperextension or hyperflexion of the cervical spine.   Review and correct the record as indicated. Please share record with all medical staff seen.

## 2012-05-02 ENCOUNTER — Encounter: Payer: Self-pay | Admitting: Internal Medicine

## 2012-05-07 ENCOUNTER — Telehealth: Payer: Self-pay | Admitting: Internal Medicine

## 2012-05-07 NOTE — Telephone Encounter (Signed)
In reference to Chiropractic referral entered on 04/30/12, per my call to [facility], they called patient, and patient stated she did not want to schedule an appointment.

## 2012-07-03 ENCOUNTER — Ambulatory Visit (INDEPENDENT_AMBULATORY_CARE_PROVIDER_SITE_OTHER): Payer: BC Managed Care – PPO | Admitting: Internal Medicine

## 2012-07-03 ENCOUNTER — Encounter: Payer: Self-pay | Admitting: Internal Medicine

## 2012-07-03 VITALS — BP 126/84 | HR 81 | Temp 98.5°F | Resp 16 | Wt 177.5 lb

## 2012-07-03 DIAGNOSIS — K589 Irritable bowel syndrome without diarrhea: Secondary | ICD-10-CM

## 2012-07-03 DIAGNOSIS — R141 Gas pain: Secondary | ICD-10-CM

## 2012-07-03 DIAGNOSIS — R140 Abdominal distension (gaseous): Secondary | ICD-10-CM

## 2012-07-03 DIAGNOSIS — R112 Nausea with vomiting, unspecified: Secondary | ICD-10-CM

## 2012-07-03 LAB — HEPATIC FUNCTION PANEL
AST: 42 U/L — ABNORMAL HIGH (ref 0–37)
Albumin: 3.2 g/dL — ABNORMAL LOW (ref 3.5–5.2)
Total Bilirubin: 0.5 mg/dL (ref 0.3–1.2)

## 2012-07-03 LAB — CBC WITH DIFFERENTIAL/PLATELET
Basophils Relative: 0.3 % (ref 0.0–3.0)
Eosinophils Absolute: 0.4 10*3/uL (ref 0.0–0.7)
Eosinophils Relative: 4.2 % (ref 0.0–5.0)
HCT: 39.4 % (ref 36.0–46.0)
Lymphs Abs: 1.3 10*3/uL (ref 0.7–4.0)
MCHC: 33 g/dL (ref 30.0–36.0)
MCV: 83.3 fl (ref 78.0–100.0)
Monocytes Absolute: 0.7 10*3/uL (ref 0.1–1.0)
Neutrophils Relative %: 73.3 % (ref 43.0–77.0)
Platelets: 216 10*3/uL (ref 150.0–400.0)

## 2012-07-03 LAB — BASIC METABOLIC PANEL
BUN: 9 mg/dL (ref 6–23)
Calcium: 9.6 mg/dL (ref 8.4–10.5)
GFR: 100.68 mL/min (ref >60.00)
Glucose, Bld: 82 mg/dL (ref 70–99)
Potassium: 4.1 mEq/L (ref 3.5–5.1)

## 2012-07-03 LAB — LIPASE: Lipase: 28 U/L (ref 11.0–59.0)

## 2012-07-03 LAB — AMYLASE: Amylase: 35 U/L (ref 27–131)

## 2012-07-03 MED ORDER — HYOSCYAMINE SULFATE 0.125 MG SL SUBL: 0.1250 mg | SUBLINGUAL_TABLET | SUBLINGUAL | Status: DC | PRN

## 2012-07-03 NOTE — Patient Instructions (Addendum)
Please review the record and make any corrections; share this with all medical staff seen. If you note change or progression in symptoms please contact us through My Chart ASAP. This will allow [place] to respond as quickly as possible and schedule appropriate studies (blood test or imaging).  If you activate the  My Chart system; lab & Xray results will be released directly  to you as soon as I review & address these through the computer. If you choose not to sign up for My Chart within 36 hours of labs being drawn; results will be reviewed & interpretation added before being copied & mailed, causing a delay in getting the results to you.If you do not receive that report within 7-10 days ,please call. Additionally you can use this system to gain direct  access to your records  if  out of town or @ an office of a  physician who is not in  the My Chart network.  This improves continuity of care & places you in control of your medical record.   Please see your gynecologist if the bloating persists or progresses

## 2012-07-03 NOTE — Progress Notes (Signed)
  Subjective:    Patient ID: [name], female    DOB: [birth year], 57 y.o.   MRN: [id number]  HPI   Symptoms began approximately 6 months ago with cramping. She's had nausea  on average several times a day  and vomiting occasionally and loose stool- watery stool. The possible triggers may be new job and financial stresses.  She has a history of a IBS and  those symptoms were similar except for absence of nausea with the IBS. Her irritable bowel syndrome occurred during her teens and the therapy was of benefit.  Imodium helps the loose to watery stools. The probiotic seemed to help initially but not now. She also has associated bloating, irritability, and sleep disruption.  Past medical history, family history and social history reviewed and updated   Review of Systems She denies associated significant dyspepsia, dysphagia, abdominal pain, progressive weight loss, melena, or rectal bleeding.  She is not had fever, chills, or sweats.  She also denies dysuria, hematuria, or pyuria  Her symptoms preceded the initiation of Strattera 6 weeks ago.  Her last gynecologic check was in August of 2013.     Objective:   Physical Exam  General :adequate nourishment w/o distress.  Eyes: No conjunctival inflammation or scleral icterus is present.  Oral exam: Dental hygiene is good; lips and gums are healthy appearing.There is minimal oropharyngeal erythema or exudate noted.   Heart:  Normal rate and regular rhythm. S1 and S2 normal without gallop, murmur, click, rub or other extra sounds     Lungs:Chest clear to auscultation; no wheezes, rhonchi,rales ,or rubs present.No increased work of breathing.   Abdomen: bowel sounds normal, soft and non-tender without masses, or organomegaly .Small ventral hernia noted.  No guarding or rebound   Skin:Warm & dry.  Intact without suspicious lesions or rashes ; no jaundice ; minor tenting  Lymphatic: No lymphadenopathy is noted about the head,  neck, axilla   Affect flat but communicative            Assessment & Plan:  #1 symptoms suggestive of irritable bowel; atypical is the nausea and lack of sustained response to probiotic. She is on an ADD agent but her symptoms preceded initiation of this  #2 bloating; gynecologic recheck indicated  Plan: See orders and recommendations.and did

## 2012-07-08 ENCOUNTER — Telehealth: Payer: Self-pay | Admitting: Internal Medicine

## 2012-07-08 NOTE — Telephone Encounter (Signed)
Spoke with patient and she would like to go ahead and see an extender. She prefers Thursday or Friday. Scheduled with Mike Gip, PA on 07/11/12 at 8:30 AM.

## 2012-07-11 ENCOUNTER — Encounter: Payer: Self-pay | Admitting: Physician Assistant

## 2012-07-11 ENCOUNTER — Ambulatory Visit (INDEPENDENT_AMBULATORY_CARE_PROVIDER_SITE_OTHER): Payer: BC Managed Care – PPO | Admitting: Physician Assistant

## 2012-07-11 VITALS — BP 120/82 | HR 72 | Ht 62.0 in | Wt 176.5 lb

## 2012-07-11 DIAGNOSIS — K589 Irritable bowel syndrome without diarrhea: Secondary | ICD-10-CM

## 2012-07-11 DIAGNOSIS — R110 Nausea: Secondary | ICD-10-CM

## 2012-07-11 DIAGNOSIS — R197 Diarrhea, unspecified: Secondary | ICD-10-CM

## 2012-07-11 MED ORDER — GLYCOPYRROLATE 2 MG PO TABS: 2.0000 mg | ORAL_TABLET | Freq: Two times a day (BID) | ORAL | Status: DC

## 2012-07-11 NOTE — Progress Notes (Signed)
Reviewed, pt known to me, used to work in our office. Hx of functional symptoms related to stress. The psychotropic medications may be causing some of her side effects.

## 2012-07-11 NOTE — Progress Notes (Signed)
Subjective:    Patient ID: [name], female    DOB: Nov 16, 1955, 57 y.o.   MRN: [id number]  HPI [name] is a very nice 57 year old white female known remotely to Dr. Juanda Chance. She had a colonoscopy in 2004 which was normal with the exception of internal hemorrhoids and had EGD in 2009 showing reflux esophagitis. She has history of breast cancer, bipolar disorder, ADD, depression, and diabetes. She comes in today with complaints of six-month history of increased GI symptoms with nausea abdominal bloating cramping and diarrhea. She says she had lost about 30 pounds from August to January of 2014 but had been feeling very stressed at that time. She has been seeing a psychiatrist and was started on several new medicines at the beginning of the year to help with anxiety and bipolar disease and says her GI symptoms started after that says her appetite is fair her weight is been staying relatively stable. She was having ongoing loose to watery stools with 2-3 bowel movements per day and intermittent abdominal bloating cramping and very frequent nausea . The nausea was bothering her more than anything and she felt this was medication induced so she stopped taking her morning doses of lithium and stop taking Strattera. She is continued on her evening dose of lithium, Xanax, and Zoloft at bedtime. She says she's been on lithium for a long time but had her dose increased in December/ January and also started the Strattera at that time.  Labs were done on 07/03/2012, have been reviewed and are unremarkable with the exception of very mild elevation of her AST at 42 . Reviewing her labs over the past few years she has had a mild transaminitis which has been attributed to lithium SHe has been off of the above meds for about 6 weeks and says the nausea is at least 50% better. She says she remains very anxious and distressed both at work and at home.    Review of Systems  Constitutional: Positive for appetite change.   HENT: Negative.   Eyes: Negative.   Respiratory: Negative.   Cardiovascular: Negative.   Gastrointestinal: Positive for nausea, abdominal pain and diarrhea.  Endocrine: Negative.   Musculoskeletal: Negative.   Skin: Negative.   Allergic/Immunologic: Negative.   Neurological: Negative.   Hematological: Negative.   Psychiatric/Behavioral: The patient is nervous/anxious.    Outpatient Prescriptions Prior to Visit  Medication Sig Dispense Refill  . ALPRAZolam (XANAX) 0.25 MG tablet Take 0.25 mg by mouth. 1-2 by mouth at bedtime as needed, Rx'ed by Dr.Taylor      . levothyroxine (SYNTHROID, LEVOTHROID) 100 MCG tablet TAKE 1 TAB ON SUN,MON,TUES,THURS,FRI AND SAT AND TAKE 1/2 TABLET ON WED  90 tablet  1  . lithium 600 MG capsule Take 600 mg by mouth at bedtime.       Marland Kitchen rOPINIRole (REQUIP) 1 MG tablet TAKE 1 TABLET BY MOUTH AT BEDTIME 3 HOURS BEFORE SLEEP  30 tablet  5  . sertraline (ZOLOFT) 50 MG tablet Take 50 mg by mouth daily. RX'ed by Dr.Taylor      . atomoxetine (STRATTERA) 10 MG capsule Take 40 mg by mouth daily.       . hyoscyamine (LEVSIN/SL) 0.125 MG SL tablet Place 1 tablet (0.125 mg total) under the tongue every 4 (four) hours as needed for cramping.  30 tablet  0  . cyclobenzaprine (FLEXERIL) 5 MG tablet 1-2 qhs prn  20 tablet  0   No facility-administered medications prior to visit.   No  Known Allergies Patient Active Problem List   Diagnosis Date Noted  . Lithium use 09/17/2011  . Abnormal coordination 09/17/2011  . Premature menopause   . Depression   . Anxiety   . Bipolar disorder   . ADD (attention deficit disorder)   . Diabetes mellitus   . NONSPECIFIC ABNORMAL ELECTROCARDIOGRAM 11/04/2009  . ALLERGIC RHINITIS 09/01/2009  . INCONTINENCE 09/01/2009  . DIAB W/O COMP TYPE II/UNS NOT STATED UNCNTRL 07/28/2008  . NONSPEC ELEVATION OF LEVELS OF TRANSAMINASE/LDH 07/28/2008  . MOTOR RESTLESSNESS 02/19/2008  . Internal Hemorrhoids without Mention of Complication  09/15/2007  . ESOPHAGEAL STRICTURE 09/15/2007  . DYSPHAGIA 09/15/2007  . OBSTRUCTIVE SLEEP APNEA 07/21/2007  . HYPERLIPIDEMIA 07/07/2007  . Osteopenia 02/20/2007  . HYPOTHYROIDISM 10/01/2006  . BREAST CANCER, HX OF 08/12/2006   History  Substance Use Topics  . Smoking status: Never Smoker   . Smokeless tobacco: Never Used  . Alcohol Use: Yes     Comment: very rarely      family history includes Breast cancer (age of onset: 68) in her maternal aunt; Breast cancer (age of onset: 60) in her maternal aunt; Breast cancer (age of onset: 48) in her maternal aunt; Diabetes in her maternal grandmother; Heart disease in her maternal grandfather and maternal grandmother; and Hypertension in her paternal grandfather.  There is no history of Ovarian cancer.  Objective:   Physical Exam well-developed white female in no acute distress, pleasant blood pressure 120/82 pulse 72 height 5 foot 2 weight 176. HEENT; nontraumatic normocephalic EOMI PERRLA sclera anicteric, Neck;Supple no JVD, Cardiovascular; regular rate and rhythm with S1-S2 no murmur or gallop, Pulmonary; clear bilaterally, Abdomen; large soft no focal tenderness no guarding or rebound no palpable mass or hepatosplenomegaly bowel sounds are active, Rectal ;exam not done, Extremities; no clubbing cyanosis or edema skin warm and dry, Psych; mood and affect normal and appropriate.        Assessment & Plan:  #72 57 year old female with chronic IBS now with exacerbation over the past 6 months manifested by abdominal bloating cramping diarrhea and nausea. I suspect most of her symptoms are being exacerbated by medications including Strattera,lithium ,Zoloft, and Requip. Nausea has improved with self reduction in dose of lithium and stopping Strattera #2 chronic anxiety #3 bipolar disorder #4 ADD #5 adult-onset diabetes mellitus #6 history of breast cancer #7 history of mild transaminitis felt secondary to lithium  Plan; patient has followup  with her psychiatrist next week to discuss meds She has been taking Align and will continue 1 daily Add trial of Robinul Forte 2 mg twice daily for cramping/ bloating /spasms She is due for followup screening colonoscopy with Dr. Remigio Eisenmenger is concerned about her financial situation and will call back to schedule.

## 2012-07-11 NOTE — Patient Instructions (Addendum)
We sent the prescription for Glycopyralate ( Robinul Forte) to your pharmacy, [facility]. Continue the Align probiotic, 1 capsule daily.  We have given you 2 coupons.  Call us back regarding scheduling the colonoscopy with Dr. Lina Sar.

## 2012-09-12 ENCOUNTER — Other Ambulatory Visit (HOSPITAL_COMMUNITY): Payer: Self-pay | Admitting: Family Medicine

## 2012-09-12 ENCOUNTER — Other Ambulatory Visit: Payer: Self-pay | Admitting: Family Medicine

## 2012-09-12 DIAGNOSIS — R188 Other ascites: Secondary | ICD-10-CM

## 2012-09-16 ENCOUNTER — Ambulatory Visit
Admission: RE | Admit: 2012-09-16 | Discharge: 2012-09-16 | Disposition: A | Discharge status: Home / Self Care | Payer: BC Managed Care – PPO | Source: Ambulatory Visit | Attending: Family Medicine | Admitting: Family Medicine

## 2012-09-16 DIAGNOSIS — R188 Other ascites: Secondary | ICD-10-CM

## 2012-09-16 MED ORDER — IOHEXOL 300 MG/ML  SOLN
100.0000 mL | Freq: Once | INTRAMUSCULAR | Status: AC | PRN
  Administered 2012-09-16: 100 mL via INTRAVENOUS

## 2012-09-17 ENCOUNTER — Ambulatory Visit (HOSPITAL_COMMUNITY)
Admission: RE | Admit: 2012-09-17 | Discharge: 2012-09-17 | Disposition: A | Discharge status: Home / Self Care | Payer: BC Managed Care – PPO | Source: Ambulatory Visit | Attending: Family Medicine | Admitting: Family Medicine

## 2012-09-17 ENCOUNTER — Other Ambulatory Visit: Payer: Self-pay | Admitting: Radiology

## 2012-09-17 DIAGNOSIS — R188 Other ascites: Secondary | ICD-10-CM | POA: Insufficient documentation

## 2012-09-17 LAB — BODY FLUID CELL COUNT WITH DIFFERENTIAL
Eos, Fluid: 0 %
Lymphs, Fluid: 75 %
Monocyte-Macrophage-Serous Fluid: 14 % — ABNORMAL LOW (ref 50–90)
Total Nucleated Cell Count, Fluid: 1959 cu mm — ABNORMAL HIGH (ref 0–1000)

## 2012-09-17 LAB — LACTATE DEHYDROGENASE, PLEURAL OR PERITONEAL FLUID: LD, Fluid: 157 U/L — ABNORMAL HIGH (ref 3–23)

## 2012-09-17 LAB — ALBUMIN, FLUID (OTHER)

## 2012-09-17 LAB — GLUCOSE, SEROUS FLUID

## 2012-09-17 NOTE — Procedures (Signed)
Successful US guided paracentesis from RLQ.  Yielded 2.4 Liters of yellow clear fluid.  No immediate complications.  Pt tolerated well.   Specimen was sent for labs.  Pattricia Boss D PA-C 09/17/2012 3:21 PM

## 2012-09-18 ENCOUNTER — Other Ambulatory Visit: Payer: Self-pay | Admitting: Oncology

## 2012-09-18 ENCOUNTER — Telehealth: Payer: Self-pay | Admitting: Oncology

## 2012-09-18 DIAGNOSIS — C800 Disseminated malignant neoplasm, unspecified: Secondary | ICD-10-CM

## 2012-09-18 NOTE — Telephone Encounter (Signed)
C/D 09/18/12 for appt. 09/19/12

## 2012-09-18 NOTE — Telephone Encounter (Signed)
S/W PT IN RE  NP APPT 08/01 @ 1:30 W/DR. SHADAD REFERRING - Geralynn Ochs, PA DX- DIFFUSE CARCINOMATOSIS W/OMENTALCAKING, 10X12 PERITONEAL TUMOR RT ABN

## 2012-09-18 NOTE — Telephone Encounter (Signed)
LVOM FOR PT TO RETURN CALL IN RE NP APPT.  °

## 2012-09-19 ENCOUNTER — Ambulatory Visit (HOSPITAL_BASED_OUTPATIENT_CLINIC_OR_DEPARTMENT_OTHER): Payer: BC Managed Care – PPO | Admitting: Oncology

## 2012-09-19 ENCOUNTER — Telehealth: Payer: Self-pay | Admitting: Oncology

## 2012-09-19 ENCOUNTER — Other Ambulatory Visit: Payer: Self-pay

## 2012-09-19 ENCOUNTER — Ambulatory Visit: Payer: BC Managed Care – PPO

## 2012-09-19 ENCOUNTER — Encounter: Payer: Self-pay | Admitting: Oncology

## 2012-09-19 ENCOUNTER — Other Ambulatory Visit (HOSPITAL_BASED_OUTPATIENT_CLINIC_OR_DEPARTMENT_OTHER): Payer: BC Managed Care – PPO | Admitting: Lab

## 2012-09-19 VITALS — BP 119/77 | HR 71 | Temp 97.1°F | Resp 18 | Ht 62.0 in | Wt 165.2 lb

## 2012-09-19 DIAGNOSIS — C786 Secondary malignant neoplasm of retroperitoneum and peritoneum: Secondary | ICD-10-CM | POA: Insufficient documentation

## 2012-09-19 DIAGNOSIS — C801 Malignant (primary) neoplasm, unspecified: Secondary | ICD-10-CM

## 2012-09-19 DIAGNOSIS — C50919 Malignant neoplasm of unspecified site of unspecified female breast: Secondary | ICD-10-CM | POA: Insufficient documentation

## 2012-09-19 DIAGNOSIS — Z803 Family history of malignant neoplasm of breast: Secondary | ICD-10-CM

## 2012-09-19 DIAGNOSIS — R188 Other ascites: Secondary | ICD-10-CM

## 2012-09-19 DIAGNOSIS — C800 Disseminated malignant neoplasm, unspecified: Secondary | ICD-10-CM

## 2012-09-19 LAB — COMPREHENSIVE METABOLIC PANEL (CC13)
ALT: 22 U/L (ref 0–55)
AST: 42 U/L — ABNORMAL HIGH (ref 5–34)
CO2: 27 mEq/L (ref 22–29)
Calcium: 9.2 mg/dL (ref 8.4–10.4)
Chloride: 106 mEq/L (ref 98–109)
Potassium: 4 mEq/L (ref 3.5–5.1)
Sodium: 140 mEq/L (ref 136–145)
Total Protein: 8.7 g/dL — ABNORMAL HIGH (ref 6.4–8.3)

## 2012-09-19 LAB — CANCER ANTIGEN 19-9: CA 19-9: 15.6 U/mL (ref <35.0)

## 2012-09-19 LAB — CBC WITH DIFFERENTIAL/PLATELET
BASO%: 0.6 % (ref 0.0–2.0)
EOS%: 4 % (ref 0.0–7.0)
HCT: 37.7 % (ref 34.8–46.6)
MCH: 26.9 pg (ref 25.1–34.0)
MCHC: 31.7 g/dL (ref 31.5–36.0)
MONO#: 0.5 10*3/uL (ref 0.1–0.9)
NEUT%: 66.7 % (ref 38.4–76.8)
RBC: 4.43 10*6/uL (ref 3.70–5.45)
RDW: 15.2 % — ABNORMAL HIGH (ref 11.2–14.5)
WBC: 6.2 10*3/uL (ref 3.9–10.3)
lymph#: 1.2 10*3/uL (ref 0.9–3.3)

## 2012-09-19 MED ORDER — LIDOCAINE-PRILOCAINE 2.5-2.5 % EX CREA: TOPICAL_CREAM | CUTANEOUS | Status: DC | PRN

## 2012-09-19 MED ORDER — ONDANSETRON HCL 8 MG PO TABS: 8.0000 mg | ORAL_TABLET | Freq: Three times a day (TID) | ORAL | Status: DC | PRN

## 2012-09-19 NOTE — Progress Notes (Signed)
Reason for Referral: Carcinomatosis and ascites.   HPI: [name] is a pleasant 57 year old woman currently of [place] where she lived the majority of her life. She is a very woman with a history of depression and hypothyroidism as well as history of breast cancer 25 years ago. At that time, she presented with right breast mass and underwent a lumpectomy subsequent to that treated with chemotherapy under the care of Dr. Cleone Slim followed by radiation therapy. She was offered enrollment in a clinical trial utilizing tamoxifen but she declined. She been disease-free as mentioned will let time. For the last 2 months, she had been noticing symptoms of GI troubles including recurrent nausea, vomiting, abdominal soreness, and weight loss. She will is attributing it is predominantly to her depression and depression medications as well as stress levels. She subsequently underwent a CT scan since her symptoms persist on 09/16/2012. A CT scan showed finding of ascites and omental carcinomatosis and diffuse peritoneal masses. Patient underwent paracentesis on 09/17/2012 was 2.4 L of fluid drained. 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 Patient referred to me for further evaluation and treatment.  Clinically, she feels bloated with abdominal fullness with liver lesions after her recent paracentesis. She is eating better at this point was low weight loss. She slightly nauseated but no vomiting. She had not reported any breast masses. She had not reported any bleeding GI or GU or otherwise. She did not report any fevers or chills or sweats. She did not report any shortness of breath or difficulty breathing. She did report weakness fatigue and inability to drive recently. She has not reported any GYN bleeding or pruritus. Overall, she hasn't noted functional decline in the last few weeks.   Past Medical History  Diagnosis Date  . Premature menopause   .  Osteopenia 03/2009    t score -2.1 FRAX 4.6/0.4  . Hypothyroidism   . Depression   . Anxiety   . Hyperlipidemia   . Bipolar disorder   . ADD (attention deficit disorder)   . Diabetes mellitus   . Breast cancer 1989  :  Past Surgical History  Procedure Laterality Date  . Breast surgery  1989    RIGHT LUMPECTOMY, RADIATION AND CHEMO  . Hysteroscopy  2011    Polyp  . Pelvic laparoscopy/ hysteroscopy  1996  . Foot surgery  2013    BILATERAL   :   Current Outpatient Prescriptions  Medication Sig Dispense Refill  . ALPRAZolam (XANAX) 0.25 MG tablet Take 0.25 mg by mouth. 1-2 by mouth at bedtime as needed, Rx'ed by Dr.Taylor      . glycopyrrolate (ROBINUL) 2 MG tablet Take 1 tablet (2 mg total) by mouth 2 (two) times daily.  60 tablet  5  . hyoscyamine (LEVSIN/SL) 0.125 MG SL tablet Place 1 tablet (0.125 mg total) under the tongue every 4 (four) hours as needed for cramping.  30 tablet  0  . levothyroxine (SYNTHROID, LEVOTHROID) 100 MCG tablet TAKE 1 TAB ON SUN,MON,TUES,THURS,FRI AND SAT AND TAKE 1/2 TABLET ON WED  90 tablet  1  . Probiotic Product (ALIGN PO) Take by mouth.      Marland Kitchen rOPINIRole (REQUIP) 1 MG tablet TAKE 1 TABLET BY MOUTH AT BEDTIME 3 HOURS BEFORE SLEEP  30 tablet  5  . lidocaine-prilocaine (EMLA) cream Apply topically as needed. Apply to port with every chemotherapy.  30 g  1  . ondansetron (ZOFRAN) 8 MG tablet Take 1  tablet (8 mg total) by mouth every 8 (eight) hours as needed for nausea.  20 tablet  0   No current facility-administered medications for this visit.      No Known Allergies:  Family History  Problem Relation Age of Onset  . Breast cancer Maternal Aunt 30  . Diabetes Maternal Grandmother   . Heart disease Maternal Grandmother   . Heart disease Maternal Grandfather   . Hypertension Paternal Grandfather   . Breast cancer Maternal Aunt 70  . Breast cancer Maternal Aunt 40  . Ovarian cancer Neg Hx   :  History   Social History  . Marital  Status: Married    Spouse Name: N/A    Number of Children: N/A  . Years of Education: N/A   Occupational History  . Not on file.   Social History Main Topics  . Smoking status: Never Smoker   . Smokeless tobacco: Never Used  . Alcohol Use: Yes     Comment: very rarely  . Drug Use: No  . Sexually Active: No   Other Topics Concern  . Not on file   Social History Narrative  . No narrative on file  :  Pertinent items are noted in HPI.  Exam: ECOG 1 Blood pressure 119/77, pulse 71, temperature 97.1 F (36.2 C), temperature source Oral, resp. rate 18, height 5\' 2"  (1.575 m), weight 165 lb 3.2 oz (74.934 kg). General appearance: alert, cooperative and appears stated age Head: Normocephalic, without obvious abnormality, atraumatic Throat: lips, mucosa, and tongue normal; teeth and gums normal Neck: no adenopathy, no carotid bruit, no JVD, supple, symmetrical, trachea midline and thyroid not enlarged, symmetric, no tenderness/mass/nodules Back: negative, symmetric, no curvature. ROM normal. No CVA tenderness. Resp: clear to auscultation bilaterally Chest wall: no tenderness Cardio: regular rate and rhythm, S1, S2 normal, no murmur, click, rub or gallop GI: abnormal findings:  ascites and distended Extremities: extremities normal, atraumatic, no cyanosis or edema Pulses: 2+ and symmetric Skin: Skin color, texture, turgor normal. No rashes or lesions Lymph nodes: Cervical, supraclavicular, and axillary nodes normal.   Recent Labs  09/19/12 1333  WBC 6.2  HGB 11.9  HCT 37.7  PLT 212    Recent Labs  09/19/12 1333  NA 140  K 4.0  CO2 27  GLUCOSE 91  BUN 7.7  CREATININE 0.7  CALCIUM 9.2       Ct Abdomen Pelvis W Contrast  09/16/2012   *RADIOLOGY REPORT*  Clinical Data: Elevated liver function test, ascites, nausea and diarrhea, history right breast carcinoma in 1989 with lumpectomy, chemotherapy, and radiation therapy  CT ABDOMEN AND PELVIS WITH CONTRAST   Technique:  Multidetector CT imaging of the abdomen and pelvis was performed following the standard protocol during bolus administration of intravenous contrast.  Contrast: OMNIPAQUE IOHEXOL 300 MG/ML  SOLN  Comparison: None.  Findings: On the lung window images the lung bases are clear other than probable scarring posterolaterally at the left lung base. Right breast prosthesis is noted.  There is a subcutaneous focus of higher attenuation medially in the remaining right breast tissue of questionable significance, possibly due to scarring.  The liver is somewhat small and irregular in outline suspicious for changes of cirrhosis with prominence of the left lobe as well. Also there is a moderate amount of ascites throughout the peritoneal cavity.  The gallbladder is somewhat distended but no calcified gallstones are seen.  The pancreas is normal in size and the pancreatic duct is not dilated.  The adrenal glands are unremarkable and the spleen is within normal limits in size.  The stomach is moderately fluid distended with no abnormality noted. The kidneys enhance with no calculus or mass and no hydronephrosis is seen.  The abdominal aorta is normal in caliber.  However, there is massive omental caking and diffuse peritoneal carcinomatosis.  A large soft tissue mass presumably representing peritoneal tumor is noted just medial to the inferior tip of the right lobe of liver measuring 10.0 x 12.0 cm on image number 44 series 3.  The uterus is small.  There is some soft tissue within the pelvis extending toward the adnexa as well possibly representing carcinomatosis although an ovarian malignancy would be difficult to exclude.  The urinary bladder is unremarkable.  No abnormality of the colon is seen.  The terminal ileum is unremarkable and the appendix is not well seen.  IMPRESSION:  1.  Massive omental caking and diffuse peritoneal carcinomatosis with ascites. 2.  Nodular small liver suggestive of changes of  cirrhosis.  No focal hepatic lesion is seen. 3.  Small focus of higher attenuation medially in the remaining right inferior breast history of questionable significance, possibly due to scarring.   Original Report Authenticated By: Dwyane Dee, M.D.   US Paracentesis  09/17/2012   *RADIOLOGY REPORT*  Clinical Data: Ascites  ULTRASOUND GUIDED PARACENTESIS  An ultrasound guided paracentesis was thoroughly discussed with the patient and questions answered.  The benefits, risks, alternatives and complications were also discussed.  The patient understands and wishes to proceed with the procedure.  Written consent was obtained.  Ultrasound was performed to localize and mark an adequate pocket of fluid in the right lower quadrant of the abdomen.  The area was then prepped and draped in the normal sterile fashion.  1% Lidocaine was used for local anesthesia.  Under ultrasound guidance a 19 gauge Yueh catheter was introduced.  Paracentesis was performed.  The catheter was removed and a dressing applied.  Complications:  none  Findings:  A total of approximately 2.4 liters of clear yellow fluid was removed.  A fluid sample was sent for laboratory analysis.  IMPRESSION: Successful ultrasound guided paracentesis yielding 2.4 liters of ascites.  Read By: Pattricia Boss PA-C   Original Report Authenticated By: Tacey Ruiz, MD    Assessment and Plan:   57 year old woman with the following issues:  1. 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 This is a new finding in a 57 year old woman that presented with weight loss and ascites. Paracentesis performed on 09/17/2012 and confirmed the presence of adenocarcinoma. Had a lengthy discussion today with the patient and her husband discussing the possible primary tumor at this point as well as the management options. This could be recurrent breast cancer or new diagnosis of a GYN cancer such as ovarian, endometrial and possibly cervical. GI cancers are also a  possibility. To work this up completely, I will obtain a PET CT scan for staging also to look for a possible primary above the diaphragm. I doubt this is lung cancer but certainly a possibility. I will also await the workup by pathology for further identity of this tumor. I will also like to see if there is any hormonal status of this tumor such as ER or PR.  In terms of management, I see really no other possible intervention other than systemic chemotherapy to be done as soon as possible. I think combination of a platinum with a taxanes would be a reasonable option regardless  to the etiology of that tumor unless there is a suggestion of this being a GI tumor and probably 5-FU-based regimen would be used. Risks and benefits of combination chemotherapy discussed today in details. Complications that includes nausea, vomiting, GI complications as well as myelosuppression neutropenia neutropenic sepsis and possible need for intravenous antibiotics. Also the need for growth factor support was discussed today in details. Infusion-related toxicity as well as possible anaphylaxis was also entertained today. Neurological toxicity was also discussed that include peripheral neuropathy as well as ototoxicity. Nephrotoxicity with renal insufficiency was also discussed today.  Patient is willing to proceed and will set her up to start in the near future after chemotherapy education class. I will also gave her a prescription for an MR cream as well as Zofran.  2. IV access: Risks and benefits of placement of a Port-A-Cath was discussed today. Complications that includes bleeding, thrombosis, infection as well as pain were discussed and she is willing to proceed. I will refer her to interventional radiology to have that done as soon as possible.  3. Nausea prophylaxis given a prescription for Zofran as well.  4. Psychosocial support: Given her history of mental health disorder and given her options of counseling as well  as social work services at this point she appeared to be compensated and reasonably in good mood.  5. Genetic counseling: She does have a rather strong maternal family history of early breast cancer as well as a personal history of breast cancer now she is developing possibly another cancer including omental carcinomatosis I think she would be of prime candidate for genetic counseling to identify a possible genetic disorder.  All her questions were answered today I will see her in clinic followup for the start of chemotherapy.

## 2012-09-19 NOTE — Progress Notes (Signed)
Checked in new pt with no financial concerns. °

## 2012-09-19 NOTE — Telephone Encounter (Signed)
gv pt appt schedule for August and September. S/w Inetta Fermo re port placement and pt scheduled for 8/6 to arrive @ [facility] rad 9:30am - pt aware. [facility] will contact pt re pet scan appt - pt aware.

## 2012-09-20 LAB — CEA (CARCINOEMBRYONIC ANTIGEN), FLUID: CEA Fluid: 3.8 ng/mL (ref <10.0)

## 2012-09-22 ENCOUNTER — Other Ambulatory Visit: Payer: Self-pay | Admitting: Radiology

## 2012-09-23 ENCOUNTER — Encounter (HOSPITAL_COMMUNITY)
Admission: RE | Admit: 2012-09-23 | Discharge: 2012-09-23 | Disposition: A | Discharge status: Home / Self Care | Payer: BC Managed Care – PPO | Source: Ambulatory Visit | Attending: Oncology | Admitting: Oncology

## 2012-09-23 ENCOUNTER — Encounter: Payer: Self-pay | Admitting: Oncology

## 2012-09-23 ENCOUNTER — Other Ambulatory Visit: Payer: BC Managed Care – PPO

## 2012-09-23 ENCOUNTER — Encounter (HOSPITAL_COMMUNITY): Payer: Self-pay | Admitting: Pharmacy Technician

## 2012-09-23 ENCOUNTER — Encounter: Payer: Self-pay | Admitting: *Deleted

## 2012-09-23 DIAGNOSIS — C786 Secondary malignant neoplasm of retroperitoneum and peritoneum: Secondary | ICD-10-CM | POA: Insufficient documentation

## 2012-09-23 DIAGNOSIS — C50919 Malignant neoplasm of unspecified site of unspecified female breast: Secondary | ICD-10-CM | POA: Insufficient documentation

## 2012-09-23 MED ORDER — FLUDEOXYGLUCOSE F - 18 (FDG) INJECTION: 16.7000 | Freq: Once | INTRAVENOUS | Status: AC | PRN

## 2012-09-23 MED ORDER — FLUDEOXYGLUCOSE F - 18 (FDG) INJECTION
16.7000 | Freq: Once | INTRAVENOUS | Status: AC | PRN
  Administered 2012-09-23: 16.7 via INTRAVENOUS

## 2012-09-23 NOTE — Progress Notes (Signed)
Enrolled pt in the [employer] program.  I faxed signed form and activated card today.  °

## 2012-09-24 ENCOUNTER — Encounter (HOSPITAL_COMMUNITY): Payer: Self-pay

## 2012-09-24 ENCOUNTER — Other Ambulatory Visit: Payer: Self-pay | Admitting: Oncology

## 2012-09-24 ENCOUNTER — Ambulatory Visit (HOSPITAL_COMMUNITY)
Admission: RE | Admit: 2012-09-24 | Discharge: 2012-09-24 | Disposition: A | Discharge status: Home / Self Care | Payer: BC Managed Care – PPO | Source: Ambulatory Visit | Attending: Oncology | Admitting: Oncology

## 2012-09-24 ENCOUNTER — Other Ambulatory Visit: Payer: BC Managed Care – PPO

## 2012-09-24 DIAGNOSIS — C786 Secondary malignant neoplasm of retroperitoneum and peritoneum: Secondary | ICD-10-CM

## 2012-09-24 DIAGNOSIS — E119 Type 2 diabetes mellitus without complications: Secondary | ICD-10-CM | POA: Insufficient documentation

## 2012-09-24 DIAGNOSIS — C50919 Malignant neoplasm of unspecified site of unspecified female breast: Secondary | ICD-10-CM | POA: Insufficient documentation

## 2012-09-24 DIAGNOSIS — E039 Hypothyroidism, unspecified: Secondary | ICD-10-CM | POA: Insufficient documentation

## 2012-09-24 DIAGNOSIS — E785 Hyperlipidemia, unspecified: Secondary | ICD-10-CM | POA: Insufficient documentation

## 2012-09-24 LAB — CBC WITH DIFFERENTIAL/PLATELET
Basophils Absolute: 0 10*3/uL (ref 0.0–0.1)
Eosinophils Absolute: 0.2 10*3/uL (ref 0.0–0.7)
Eosinophils Relative: 4 % (ref 0–5)
Lymphocytes Relative: 25 % (ref 12–46)
MCV: 88.8 fL (ref 78.0–100.0)
Neutrophils Relative %: 61 % (ref 43–77)
Platelets: 190 10*3/uL (ref 150–400)
RBC: 4.38 MIL/uL (ref 3.87–5.11)
RDW: 14.6 % (ref 11.5–15.5)
WBC: 5.6 10*3/uL (ref 4.0–10.5)

## 2012-09-24 LAB — PROTIME-INR
INR: 1.07 (ref 0.00–1.49)
Prothrombin Time: 13.7 seconds (ref 11.6–15.2)

## 2012-09-24 MED ORDER — FENTANYL CITRATE 0.05 MG/ML IJ SOLN
INTRAMUSCULAR | Status: AC
  Filled 2012-09-24: qty 4

## 2012-09-24 MED ORDER — CEFAZOLIN SODIUM-DEXTROSE 2-3 GM-% IV SOLR
2.0000 g | Freq: Once | INTRAVENOUS | Status: AC
  Administered 2012-09-24: 2 g via INTRAVENOUS
  Filled 2012-09-24: qty 50

## 2012-09-24 MED ORDER — FENTANYL CITRATE 0.05 MG/ML IJ SOLN
INTRAMUSCULAR | Status: AC | PRN
  Administered 2012-09-24: 100 ug via INTRAVENOUS

## 2012-09-24 MED ORDER — LIDOCAINE HCL 1 % IJ SOLN
INTRAMUSCULAR | Status: AC
  Filled 2012-09-24: qty 20

## 2012-09-24 MED ORDER — MIDAZOLAM HCL 2 MG/2ML IJ SOLN
INTRAMUSCULAR | Status: AC | PRN
  Administered 2012-09-24: 2 mg via INTRAVENOUS

## 2012-09-24 MED ORDER — MIDAZOLAM HCL 2 MG/2ML IJ SOLN
INTRAMUSCULAR | Status: AC
  Filled 2012-09-24: qty 4

## 2012-09-24 MED ORDER — SODIUM CHLORIDE 0.9 % IV SOLN
INTRAVENOUS | Status: DC
  Administered 2012-09-24: 500 mL via INTRAVENOUS

## 2012-09-24 MED ORDER — HEPARIN SOD (PORK) LOCK FLUSH 100 UNIT/ML IV SOLN
500.0000 [IU] | Freq: Once | INTRAVENOUS | Status: AC
  Administered 2012-09-24: 500 [IU] via INTRAVENOUS

## 2012-09-24 NOTE — H&P (Signed)
Agree 

## 2012-09-24 NOTE — H&P (Signed)
[name] is an 57 y.o. female.   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 Premature menopause   . Osteopenia 03/2009    t score -2.1 FRAX 4.6/0.4  . Hypothyroidism   . Depression   . Anxiety   . Hyperlipidemia   . Bipolar disorder   . ADD (attention deficit disorder)   . Diabetes mellitus   . Breast cancer 1989    Past Surgical History  Procedure Laterality Date  . Breast surgery  1989    RIGHT LUMPECTOMY, RADIATION AND CHEMO  . Hysteroscopy  2011    Polyp  . Pelvic laparoscopy/ hysteroscopy  1996  . Foot surgery  2013    BILATERAL     Family History  Problem Relation Age of Onset  . Breast cancer Maternal Aunt 30  . Diabetes Maternal Grandmother   . Heart disease Maternal Grandmother   . Heart disease Maternal Grandfather   . Hypertension Paternal Grandfather   . Breast cancer Maternal Aunt 70  . Breast cancer Maternal Aunt 40  . Ovarian cancer Neg Hx    Social History:  reports that she has never smoked. She has never used smokeless tobacco. She reports that  drinks alcohol. She reports that she does not use illicit drugs.  Allergies: No Known Allergies   (Not in a hospital admission)  Results for orders placed during the hospital encounter of 09/24/12 (from the past 48 hour(s))  APTT     Status: None   Collection Time    09/24/12  9:55 AM      Result Value Range   aPTT 28  24 - 37 seconds  CBC WITH DIFFERENTIAL     Status: Abnormal   Collection Time    09/24/12  9:55 AM      Result Value Range   WBC 5.6  4.0 - 10.5 K/uL   RBC 4.38  3.87 - 5.11 MIL/uL   Hemoglobin 11.9 (*) 12.0 - 15.0 g/dL   HCT 16.1  09.6 - 04.5 %   MCV 88.8  78.0 - 100.0 fL   MCH 27.2  26.0  - 34.0 pg   MCHC 30.6  30.0 - 36.0 g/dL   RDW 40.9  81.1 - 91.4 %   Platelets 190  150 - 400 K/uL   Neutrophils Relative % 61  43 - 77 %   Neutro Abs 3.4  1.7 - 7.7 K/uL   Lymphocytes Relative 25  12 - 46 %   Lymphs Abs 1.4  0.7 - 4.0 K/uL   Monocytes Relative 10  3 - 12 %   Monocytes Absolute 0.5  0.1 - 1.0 K/uL   Eosinophils Relative 4  0 - 5 %   Eosinophils Absolute 0.2  0.0 - 0.7 K/uL   Basophils Relative 1  0 - 1 %   Basophils Absolute 0.0  0.0 - 0.1 K/uL  PROTIME-INR     Status: None   Collection Time    09/24/12  9:55 AM      Result Value Range   Prothrombin Time 13.7  11.6 - 15.2 seconds   INR 1.07  0.00 - 1.49   Nm Pet Image Initial (pi) Skull Base To Thigh  09/23/2012   *RADIOLOGY REPORT*  Clinical Data: Initial treatment strategy for breast cancer with diffuse peritoneal carcinomatosis.  NUCLEAR MEDICINE PET SKULL BASE TO THIGH  Fasting Blood Glucose:  87  Technique:  16.7 mCi F-18 FDG was injected intravenously. CT data was obtained and used for attenuation correction and anatomic localization only.  (This was not acquired as a diagnostic CT examination.) Additional exam technical data entered on technologist worksheet.  Comparison:  Multiple exams, including 09/16/2012  Findings:  Neck: Activity along the palate and mandible is likely to be incidental.  Laryngeal activity is likely physiologic and has no abnormal CT correlate.  Bilateral chronic maxillary sinusitis observed.  Thyroid gland absent.  Chest:  7 mm right upper paratracheal lymph node is not hypermetabolic.  There is a small left pleural effusion without hypermetabolic activity currently.  Right mastectomy or partial mastectomy noted.  There is stranding in the right breast tissues with maximum standard uptake value of 2.4  Abdomen/Pelvis:  Moderate ascites with extensive omental caking of tumor observed.  In the right abdomen, the confluent 10.4 x 5.1 cm mass in the pericolic gutter region has a maximum standard uptake  value of 7.1.  In the left central omentum and mesentery, an 8.1 x 6.3 cm mass-like confluence of tumor has a maximum standard uptake value of 8.2.  Nodularity and infiltration of the omentum and portions of the mesentery observed.  Nodular liver contour compatible with cirrhosis.  Mild sclerosis along the iliac sides of the sacroiliac joints.  Skeleton:  No significant bony abnormalities.  IMPRESSION:  1.  Extensive peritoneal carcinomatosis with bulky tumor particularly along the omentum and tracking into portions of the mesentery, with associated ascites. 2.  Small left pleural effusion.  No definite hypermetabolic activity associated with this pleural effusion is identified to suggest specific indicators of pleural metastatic disease at this time. 3.  Stranding with low grade activity in the residual right breast tissue is probably postoperative rather than due to localized recurrence of malignancy, but may merit observation. 4.  Mild bilateral chronic maxillary sinusitis.   Original Report Authenticated By: Gaylyn Rong, M.D.    Review of Systems  Constitutional: Positive for weight loss and malaise/fatigue. Negative for fever.  Respiratory: Positive for shortness of breath.   Cardiovascular: Negative for chest pain.  Gastrointestinal: Positive for nausea, vomiting and abdominal pain.  Musculoskeletal: Positive for back pain.  Neurological: Positive for weakness. Negative for dizziness and headaches.    Blood pressure 108/35, pulse 70, temperature 97.8 F (36.6 C), temperature source Oral, resp. rate 18, height 5\' 2"  (1.575 m), weight 165 lb (74.844 kg), SpO2 99.00%. Physical Exam  Constitutional: She is oriented to person, place, and time.  Cardiovascular: Normal rate, regular rhythm and normal heart sounds.   No murmur heard. Respiratory: Effort normal and breath sounds normal. She has no wheezes.  GI: Soft. Bowel sounds are normal. She exhibits distension. There is no tenderness.   Musculoskeletal: Normal range of motion.  Neurological: She is alert and oriented to person, place, and time.  Psychiatric: She has a normal mood and affect. Her behavior is normal. Judgment and thought content normal.     Assessment/Plan Peritoneal carcinomatosis Ascites Hx Breast Ca Scheduled for PAC now Pt aware of procedure benefits and risks and agreeable to proceed Consent signed and in chart  Lois Ostrom A 09/24/2012, 10:17 AM

## 2012-09-24 NOTE — Procedures (Signed)
Procedure:  Porta-cath Access:  Right IJ vein  Findings:  SL PAC at cavoatrial junction.  No PTX.  OK to use.

## 2012-09-24 NOTE — Progress Notes (Signed)
Pt arrived today for prep for PAC insertion in Radiology. Ambulated in and was very animated and talking stating she is sort of" overwhelmed with all this" as soon as patient was on stretcher she was almost sound asleep . Pt denies taking any medication this Am but states she has not slept well lately and is now very relaxed. Easy to awaken but falls quickly to sleep.

## 2012-09-29 ENCOUNTER — Telehealth: Payer: Self-pay | Admitting: *Deleted

## 2012-09-30 NOTE — Telephone Encounter (Signed)
Spoke with patient, dr Clelia Croft will discuss scan results at her next appt, there is no cancer anywhere else that we didn't talk about already.

## 2012-10-01 ENCOUNTER — Other Ambulatory Visit (HOSPITAL_BASED_OUTPATIENT_CLINIC_OR_DEPARTMENT_OTHER): Payer: BC Managed Care – PPO | Admitting: Lab

## 2012-10-01 ENCOUNTER — Ambulatory Visit (HOSPITAL_BASED_OUTPATIENT_CLINIC_OR_DEPARTMENT_OTHER): Payer: BC Managed Care – PPO

## 2012-10-01 ENCOUNTER — Ambulatory Visit (HOSPITAL_BASED_OUTPATIENT_CLINIC_OR_DEPARTMENT_OTHER): Payer: BC Managed Care – PPO | Admitting: Oncology

## 2012-10-01 VITALS — BP 145/77 | HR 68 | Temp 98.0°F | Resp 18

## 2012-10-01 VITALS — BP 118/77 | HR 70 | Temp 96.9°F | Resp 18 | Ht 62.0 in | Wt 163.9 lb

## 2012-10-01 DIAGNOSIS — C801 Malignant (primary) neoplasm, unspecified: Secondary | ICD-10-CM

## 2012-10-01 DIAGNOSIS — C50919 Malignant neoplasm of unspecified site of unspecified female breast: Secondary | ICD-10-CM

## 2012-10-01 DIAGNOSIS — R188 Other ascites: Secondary | ICD-10-CM

## 2012-10-01 DIAGNOSIS — Z5111 Encounter for antineoplastic chemotherapy: Secondary | ICD-10-CM

## 2012-10-01 DIAGNOSIS — C786 Secondary malignant neoplasm of retroperitoneum and peritoneum: Secondary | ICD-10-CM

## 2012-10-01 DIAGNOSIS — C569 Malignant neoplasm of unspecified ovary: Secondary | ICD-10-CM

## 2012-10-01 LAB — CBC WITH DIFFERENTIAL/PLATELET
EOS%: 4.9 % (ref 0.0–7.0)
MCH: 26.6 pg (ref 25.1–34.0)
MCV: 87.7 fL (ref 79.5–101.0)
MONO%: 8.9 % (ref 0.0–14.0)
NEUT#: 3.1 10*3/uL (ref 1.5–6.5)
RBC: 4.55 10*6/uL (ref 3.70–5.45)
RDW: 14.7 % — ABNORMAL HIGH (ref 11.2–14.5)
lymph#: 1.4 10*3/uL (ref 0.9–3.3)
nRBC: 0 % (ref 0–0)

## 2012-10-01 LAB — COMPREHENSIVE METABOLIC PANEL (CC13)
ALT: 15 U/L (ref 0–55)
AST: 34 U/L (ref 5–34)
Alkaline Phosphatase: 54 U/L (ref 40–150)
Sodium: 139 mEq/L (ref 136–145)
Total Bilirubin: 0.24 mg/dL (ref 0.20–1.20)
Total Protein: 8.8 g/dL — ABNORMAL HIGH (ref 6.4–8.3)

## 2012-10-01 MED ORDER — HEPARIN SOD (PORK) LOCK FLUSH 100 UNIT/ML IV SOLN
500.0000 [IU] | Freq: Once | INTRAVENOUS | Status: AC | PRN
  Administered 2012-10-01: 500 [IU]
  Filled 2012-10-01: qty 5

## 2012-10-01 MED ORDER — DIPHENHYDRAMINE HCL 50 MG/ML IJ SOLN
25.0000 mg | Freq: Once | INTRAMUSCULAR | Status: AC | PRN
  Administered 2012-10-01: 25 mg via INTRAVENOUS

## 2012-10-01 MED ORDER — DOCETAXEL CHEMO INJECTION 160 MG/16ML
60.0000 mg/m2 | Freq: Once | INTRAVENOUS | Status: AC
  Administered 2012-10-01: 110 mg via INTRAVENOUS
  Filled 2012-10-01: qty 11

## 2012-10-01 MED ORDER — ONDANSETRON 16 MG/50ML IVPB (CHCC)
16.0000 mg | Freq: Once | INTRAVENOUS | Status: AC
  Administered 2012-10-01: 16 mg via INTRAVENOUS

## 2012-10-01 MED ORDER — SODIUM CHLORIDE 0.9 % IJ SOLN
10.0000 mL | INTRAMUSCULAR | Status: DC | PRN
  Administered 2012-10-01: 10 mL
  Filled 2012-10-01: qty 10

## 2012-10-01 MED ORDER — DEXAMETHASONE SODIUM PHOSPHATE 20 MG/5ML IJ SOLN
20.0000 mg | Freq: Once | INTRAMUSCULAR | Status: AC
  Administered 2012-10-01: 20 mg via INTRAVENOUS

## 2012-10-01 MED ORDER — SODIUM CHLORIDE 0.9 % IV SOLN
Freq: Once | INTRAVENOUS | Status: AC
  Administered 2012-10-01: 11:00:00 via INTRAVENOUS

## 2012-10-01 MED ORDER — SODIUM CHLORIDE 0.9 % IV SOLN
668.0000 mg | Freq: Once | INTRAVENOUS | Status: AC
  Administered 2012-10-01: 670 mg via INTRAVENOUS
  Filled 2012-10-01: qty 67

## 2012-10-01 NOTE — Progress Notes (Signed)
First time Taxotere started at 12:25 pm. At 12:33, patient complained of itching to her bilateral forearms. Also, patient has a history of itching. Taxotere stopped. Normal Saline infusing. Dr. Clelia Croft notified. VSS. Patient denied SOB, CP and nausea. Orders received. Benadryl 25 mg IV was given at 12:37. See Doc Flowsheets for all vital signs. Taxotere was stopped for 40 minutes. Per Dr. Clelia Croft, its OK to restart the Taxotere if itching has stopped. VSS. Taxotere was restarted at 13:20 and patient had no complaints throughout the infusion.

## 2012-10-01 NOTE — Progress Notes (Signed)
Hematology and Oncology Follow Up Visit  [name] [id number] 02-21-1955 57 y.o. 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 This was diagnosed in July of 2014.   Prior Therapy:  She is status post paracentesis performed on 09/17/2012 with the cytology confirmed the presence of adenocarcinoma and immunohistochemical stains suggest GYN etiology. She is also status post lumpectomy for breast cancer diagnosed 25 years ago followed by radiation and chemotherapy under the care of Dr. Cleone Slim. She did not receive any hormonal therapy.  Current therapy: She is under evaluation to start cycle 1 of chemotherapy utilizing carboplatin and Taxotere.  Interim History: Mrs. pirozzi is a pleasant woman I am seeing in followup after her initial evaluation on 09/19/2012. She had initially presented with peritoneal carcinomatosis and ascites and the diagnosis mentioned above. Since her last visit, she underwent a PET CT scan which confirmed the presence of predominantly peritoneal disease. She had a Port-A-Cath placed without any complications. She also attended chemotherapy education class and she is ready to proceed with cycle 1 of chemotherapy. She does report increase in her abdominal fullness and some loose bowel habits. Her appetite is down but still able to perform most activities of daily living. She had not reported any fevers or chills or sweats. She has not reported any shortness of breath or chest pain. She did not report any genitourinary or GYN bleeding.  Medications: I have reviewed the patient's current medications.  Current Outpatient Prescriptions  Medication Sig Dispense Refill  . ALPRAZolam (XANAX) 0.25 MG tablet Take 0.25 mg by mouth. 1-2 by mouth at bedtime as needed, Rx'ed by Dr.Taylor      . divalproex  (DEPAKOTE ER) 500 MG 24 hr tablet Take 1,000 mg by mouth at bedtime.      Marland Kitchen glycopyrrolate (ROBINUL) 2 MG tablet Take 2 mg by mouth 2 (two) times daily.      Marland Kitchen lamoTRIgine (LAMICTAL) 25 MG tablet Take 50 mg by mouth daily.      Marland Kitchen levothyroxine (SYNTHROID, LEVOTHROID) 100 MCG tablet Take 50-100 mcg by mouth daily before breakfast. Takes 1 tablet every day except Wednesday she takes 1/2 tablet      . lidocaine-prilocaine (EMLA) cream Apply topically as needed. Apply to port with every chemotherapy.  30 g  1  . ondansetron (ZOFRAN) 8 MG tablet Take 1 tablet (8 mg total) by mouth every 8 (eight) hours as needed for nausea.  20 tablet  0  . Probiotic Product (ALIGN PO) Take 1 tablet by mouth daily.       Marland Kitchen rOPINIRole (REQUIP) 1 MG tablet Take 1 mg by mouth at bedtime.       No current facility-administered medications for this visit.     Allergies: No Known Allergies  Past Medical History, Surgical history, Social history, and Family History were reviewed and updated.  Review of Systems: Constitutional:  Negative for fever, chills, night sweats, anorexia, weight loss, pain. Cardiovascular: no chest pain or dyspnea on exertion Respiratory: negative Neurological: negative Dermatological: negative ENT: negative Skin: Negative. Gastrointestinal: As per HPI Genito-Urinary: negative Hematological and Lymphatic: negative Breast: negative Musculoskeletal: negative Remaining ROS negative. Physical Exam: Blood pressure 118/77, pulse 70, temperature 96.9 F (36.1 C), temperature source Oral, resp. rate 18, height 5\' 2"  (1.575 m), weight 163 lb 14.4 oz (74.345 kg). ECOG:  General appearance: alert, cooperative and appears stated  age Head: Normocephalic, without obvious abnormality, atraumatic Neck: no adenopathy, no carotid bruit, no JVD, supple, symmetrical, trachea midline and thyroid not enlarged, symmetric, no tenderness/mass/nodules Lymph nodes: Cervical, supraclavicular, and axillary nodes  normal. Heart:regular rate and rhythm, S1, S2 normal, no murmur, click, rub or gallop Lung:chest clear, no wheezing, rales, normal symmetric air entry Abdomin: soft, distended and shifting dullness noted.  EXT:no erythema, induration, or nodules   Lab Results: Lab Results  Component Value Date   WBC 5.3 10/01/2012   HGB 12.1 10/01/2012   HCT 39.9 10/01/2012   MCV 87.7 10/01/2012   PLT 165 10/01/2012     Chemistry      Component Value Date/Time   NA 140 09/19/2012 1333   NA 136 07/03/2012 1359   K 4.0 09/19/2012 1333   K 4.1 07/03/2012 1359   CL 103 07/03/2012 1359   CO2 27 09/19/2012 1333   CO2 30 07/03/2012 1359   BUN 7.7 09/19/2012 1333   BUN 9 07/03/2012 1359   CREATININE 0.7 09/19/2012 1333   CREATININE 0.7 07/03/2012 1359      Component Value Date/Time   CALCIUM 9.2 09/19/2012 1333   CALCIUM 9.6 07/03/2012 1359   ALKPHOS 70 09/19/2012 1333   ALKPHOS 80 07/03/2012 1359   AST 42* 09/19/2012 1333   AST 42* 07/03/2012 1359   ALT 22 09/19/2012 1333   ALT 30 07/03/2012 1359   BILITOT 0.24 09/19/2012 1333   BILITOT 0.5 07/03/2012 1359       Radiological Studies: NUCLEAR MEDICINE PET SKULL BASE TO THIGH  Fasting Blood Glucose: 87  Technique: 16.7 mCi F-18 FDG was injected intravenously. CT data  was obtained and used for attenuation correction and anatomic  localization only. (This was not acquired as a diagnostic CT  examination.) Additional exam technical data entered on  technologist worksheet.  Comparison: Multiple exams, including 09/16/2012  Findings:  Neck: Activity along the palate and mandible is likely to be  incidental. Laryngeal activity is likely physiologic and has no  abnormal CT correlate. Bilateral chronic maxillary sinusitis  observed. Thyroid gland absent.  Chest: 7 mm right upper paratracheal lymph node is not  hypermetabolic. There is a small left pleural effusion without  hypermetabolic activity currently.  Right mastectomy or partial mastectomy noted. There is stranding   in the right breast tissues with maximum standard uptake value of  2.4  Abdomen/Pelvis: Moderate ascites with extensive omental caking of  tumor observed. In the right abdomen, the confluent 10.4 x 5.1 cm  mass in the pericolic gutter region has a maximum standard uptake  value of 7.1. In the left central omentum and mesentery, an 8.1 x  6.3 cm mass-like confluence of tumor has a maximum standard uptake  value of 8.2. Nodularity and infiltration of the omentum and  portions of the mesentery observed.  Nodular liver contour compatible with cirrhosis. Mild sclerosis  along the iliac sides of the sacroiliac joints.  Skeleton: No significant bony abnormalities.  IMPRESSION:  1. Extensive peritoneal carcinomatosis with bulky tumor  particularly along the omentum and tracking into portions of the  mesentery, with associated ascites.  2. Small left pleural effusion. No definite hypermetabolic  activity associated with this pleural effusion is identified to  suggest specific indicators of pleural metastatic disease at this  time.  3. Stranding with low grade activity in the residual right breast  tissue is probably postoperative rather than due to localized  recurrence of malignancy, but may merit observation.  4. Mild bilateral  chronic maxillary sinusitis.   Impression and Plan:  57 year old woman with the following issues:  1. Peritoneal carcinomatosis with omental involvement. The pathology confirmed the presence of adenocarcinoma of likely GYN etiology. Her CEA 125 is elevated at 333.5. I discussed the treatment options again with the patient and her husband. Risks and benefits of systemic chemotherapy were discussed again including the complications associated with carboplatin and Taxotere mostly related to infusion toxicities, myelosuppression and peripheral neuropathy. She is agreeable to proceed with cycle one today. She understands that this treatment is palliative and not curative  nature but if she has a complete response one can consider a salvage cytoreductive surgery down the line.   I have discussed the results of her PET/CT scan today which showed predominantly peritoneal involvement.  2. Neutropenia prophylaxis she will receive Neulasta after each cycle.  3. IV access: She had a Port-A-Cath inserted without any complications and she has Emla cream as well.   4. Ascites: She will require intermittent paracentesis I will schedule her for one next week.  5. Nausea prophylaxis: She was given a prescription for Zofran.  6. Psychosocial support: Given her history of mental health disorder and given her options of counseling as well as social work services at this point she appeared to be compensated and reasonably in good mood.  7. Genetic counseling: She does have a rather strong maternal family history of early breast cancer as well as a personal history of breast cancer now she is developing possibly another cancer including omental carcinomatosis I think she would be of prime candidate for genetic counseling to identify a possible genetic disorder.  8. The followup: In 3 weeks for cycle 2 of chemotherapy.    [facility], MD 8/13/201410:04 AM

## 2012-10-01 NOTE — Patient Instructions (Addendum)
[facility] Discharge Instructions for Patients Receiving Chemotherapy  Today you received the following chemotherapy agents Taxotere,carboplatin To help prevent nausea and vomiting after your treatment, we encourage you to take your nausea medication zofran 8 mg every 8 hours as needed for nausea. If you develop nausea and vomiting that is not controlled by your nausea medication, call the clinic.   BELOW ARE SYMPTOMS THAT SHOULD BE REPORTED IMMEDIATELY:  *FEVER GREATER THAN 100.5 F  *CHILLS WITH OR WITHOUT FEVER  NAUSEA AND VOMITING THAT IS NOT CONTROLLED WITH YOUR NAUSEA MEDICATION  *UNUSUAL SHORTNESS OF BREATH  *UNUSUAL BRUISING OR BLEEDING  TENDERNESS IN MOUTH AND THROAT WITH OR WITHOUT PRESENCE OF ULCERS  *URINARY PROBLEMS  *BOWEL PROBLEMS  UNUSUAL RASH Items with * indicate a potential emergency and should be followed up as soon as possible.  Feel free to call the clinic you have any questions or concerns. The clinic phone number is [phone].

## 2012-10-02 ENCOUNTER — Ambulatory Visit (HOSPITAL_BASED_OUTPATIENT_CLINIC_OR_DEPARTMENT_OTHER): Payer: BC Managed Care – PPO

## 2012-10-02 ENCOUNTER — Telehealth: Payer: Self-pay | Admitting: *Deleted

## 2012-10-02 VITALS — BP 121/73 | HR 76 | Temp 97.4°F

## 2012-10-02 DIAGNOSIS — C786 Secondary malignant neoplasm of retroperitoneum and peritoneum: Secondary | ICD-10-CM

## 2012-10-02 DIAGNOSIS — C801 Malignant (primary) neoplasm, unspecified: Secondary | ICD-10-CM

## 2012-10-02 DIAGNOSIS — C50919 Malignant neoplasm of unspecified site of unspecified female breast: Secondary | ICD-10-CM

## 2012-10-02 DIAGNOSIS — Z5189 Encounter for other specified aftercare: Secondary | ICD-10-CM

## 2012-10-02 MED ORDER — PEGFILGRASTIM INJECTION 6 MG/0.6ML
6.0000 mg | Freq: Once | SUBCUTANEOUS | Status: AC
  Administered 2012-10-02: 6 mg via SUBCUTANEOUS
  Filled 2012-10-02: qty 0.6

## 2012-10-02 NOTE — Telephone Encounter (Signed)
Jodeen here for Neulasta injection following 1st TC chemotherapy.  States is doing well except she didn't sleep last night.  No nausea, vomiting, or diarrhea.  Encouraged to drink more fluids.  Knows to call if she has any problems or concerns.

## 2012-10-02 NOTE — Patient Instructions (Addendum)

## 2012-10-07 ENCOUNTER — Ambulatory Visit (HOSPITAL_COMMUNITY): Payer: BC Managed Care – PPO

## 2012-10-08 ENCOUNTER — Ambulatory Visit (HOSPITAL_COMMUNITY): Admission: RE | Admit: 2012-10-08 | Payer: BC Managed Care – PPO | Source: Ambulatory Visit

## 2012-10-08 ENCOUNTER — Telehealth: Payer: Self-pay | Admitting: *Deleted

## 2012-10-08 NOTE — Telephone Encounter (Signed)
Husband craig calling to ask if night sweats are caused by patient's chemo? Yes, per dr Clelia Croft. Okay to take imodium for diarrhea? Yes, per dr Clelia Croft. Gave our instructions, 2 tablets after each loose stool, up to 8 tablets maximum per day. Tasia Catchings verbalized understanding.

## 2012-10-09 ENCOUNTER — Other Ambulatory Visit: Payer: Self-pay | Admitting: Oncology

## 2012-10-09 ENCOUNTER — Ambulatory Visit (HOSPITAL_COMMUNITY)
Admission: RE | Admit: 2012-10-09 | Discharge: 2012-10-09 | Disposition: A | Discharge status: Home / Self Care | Payer: BC Managed Care – PPO | Source: Ambulatory Visit | Attending: Oncology | Admitting: Oncology

## 2012-10-09 DIAGNOSIS — R188 Other ascites: Secondary | ICD-10-CM | POA: Insufficient documentation

## 2012-10-09 DIAGNOSIS — C569 Malignant neoplasm of unspecified ovary: Secondary | ICD-10-CM

## 2012-10-09 NOTE — Progress Notes (Signed)
Patient was seen today in [place] for a therapeutic paracentesis. Images reveal minimal amount of ascites. Procedure was cancelled.   Pattricia Boss PA-C Interventional Radiology  10/09/12  2:30 PM

## 2012-10-15 NOTE — Progress Notes (Signed)
I met [name] in the [facility] lobby and chatted with her there.  Today she contacted me about speaking with me as a chaplain about her cancer diagnosis.  I set up an appointment with her for September 2 in my office.

## 2012-10-17 ENCOUNTER — Other Ambulatory Visit: Payer: Self-pay | Admitting: Internal Medicine

## 2012-10-17 NOTE — Telephone Encounter (Signed)
Rx sent to the pharmacy by e-script.//AB/CMA 

## 2012-10-21 ENCOUNTER — Encounter: Payer: Self-pay | Admitting: Specialist

## 2012-10-21 NOTE — Progress Notes (Signed)
I met for an hour this morning with [name], who wanted to talk about the initial adjustment to her cancer diagnosis.  I listened, offered comfort and support, encouraged her to attend the GYN Support Group.  I also suggested she consider a new program offering this fall: [facility]:Living with Cancer.

## 2012-10-22 ENCOUNTER — Other Ambulatory Visit (HOSPITAL_BASED_OUTPATIENT_CLINIC_OR_DEPARTMENT_OTHER): Payer: BC Managed Care – PPO | Admitting: Lab

## 2012-10-22 ENCOUNTER — Ambulatory Visit (HOSPITAL_BASED_OUTPATIENT_CLINIC_OR_DEPARTMENT_OTHER): Payer: BC Managed Care – PPO | Admitting: Oncology

## 2012-10-22 ENCOUNTER — Ambulatory Visit (HOSPITAL_BASED_OUTPATIENT_CLINIC_OR_DEPARTMENT_OTHER): Payer: BC Managed Care – PPO

## 2012-10-22 ENCOUNTER — Ambulatory Visit: Payer: BC Managed Care – PPO | Admitting: Oncology

## 2012-10-22 ENCOUNTER — Encounter: Payer: Self-pay | Admitting: Oncology

## 2012-10-22 VITALS — BP 106/57 | HR 76 | Temp 98.0°F

## 2012-10-22 VITALS — BP 123/65 | HR 81 | Temp 98.0°F | Resp 18 | Ht 62.0 in | Wt 167.0 lb

## 2012-10-22 DIAGNOSIS — C50919 Malignant neoplasm of unspecified site of unspecified female breast: Secondary | ICD-10-CM

## 2012-10-22 DIAGNOSIS — Z5111 Encounter for antineoplastic chemotherapy: Secondary | ICD-10-CM

## 2012-10-22 DIAGNOSIS — C801 Malignant (primary) neoplasm, unspecified: Secondary | ICD-10-CM

## 2012-10-22 DIAGNOSIS — C786 Secondary malignant neoplasm of retroperitoneum and peritoneum: Secondary | ICD-10-CM

## 2012-10-22 DIAGNOSIS — Z853 Personal history of malignant neoplasm of breast: Secondary | ICD-10-CM

## 2012-10-22 DIAGNOSIS — R188 Other ascites: Secondary | ICD-10-CM

## 2012-10-22 LAB — CBC WITH DIFFERENTIAL/PLATELET
BASO%: 0.9 % (ref 0.0–2.0)
Basophils Absolute: 0.1 10*3/uL (ref 0.0–0.1)
EOS%: 0.2 % (ref 0.0–7.0)
HCT: 35.3 % (ref 34.8–46.6)
LYMPH%: 25.7 % (ref 14.0–49.7)
MCH: 26.9 pg (ref 25.1–34.0)
MCHC: 31.2 g/dL — ABNORMAL LOW (ref 31.5–36.0)
MCV: 86.3 fL (ref 79.5–101.0)
MONO%: 9.4 % (ref 0.0–14.0)
NEUT%: 63.8 % (ref 38.4–76.8)
lymph#: 1.5 10*3/uL (ref 0.9–3.3)

## 2012-10-22 LAB — COMPREHENSIVE METABOLIC PANEL (CC13)
AST: 26 U/L (ref 5–34)
Alkaline Phosphatase: 67 U/L (ref 40–150)
BUN: 12.2 mg/dL (ref 7.0–26.0)
Creatinine: 0.7 mg/dL (ref 0.6–1.1)
Glucose: 103 mg/dl (ref 70–140)
Potassium: 4.5 mEq/L (ref 3.5–5.1)
Total Bilirubin: 0.26 mg/dL (ref 0.20–1.20)

## 2012-10-22 MED ORDER — SODIUM CHLORIDE 0.9 % IJ SOLN
10.0000 mL | INTRAMUSCULAR | Status: DC | PRN
Start: 2012-10-22 — End: 2012-10-22
  Administered 2012-10-22: 10 mL
  Filled 2012-10-22: qty 10

## 2012-10-22 MED ORDER — DOCETAXEL CHEMO INJECTION 160 MG/16ML
60.0000 mg/m2 | Freq: Once | INTRAVENOUS | Status: AC
  Administered 2012-10-22: 110 mg via INTRAVENOUS
  Filled 2012-10-22: qty 11

## 2012-10-22 MED ORDER — ONDANSETRON 16 MG/50ML IVPB (CHCC)
16.0000 mg | Freq: Once | INTRAVENOUS | Status: AC
  Administered 2012-10-22: 16 mg via INTRAVENOUS

## 2012-10-22 MED ORDER — SODIUM CHLORIDE 0.9 % IV SOLN
Freq: Once | INTRAVENOUS | Status: AC
  Administered 2012-10-22: 12:00:00 via INTRAVENOUS

## 2012-10-22 MED ORDER — HEPARIN SOD (PORK) LOCK FLUSH 100 UNIT/ML IV SOLN
500.0000 [IU] | Freq: Once | INTRAVENOUS | Status: AC | PRN
  Administered 2012-10-22: 500 [IU]
  Filled 2012-10-22: qty 5

## 2012-10-22 MED ORDER — DEXAMETHASONE SODIUM PHOSPHATE 20 MG/5ML IJ SOLN
20.0000 mg | Freq: Once | INTRAMUSCULAR | Status: AC
  Administered 2012-10-22: 20 mg via INTRAVENOUS

## 2012-10-22 MED ORDER — SODIUM CHLORIDE 0.9 % IV SOLN
668.0000 mg | Freq: Once | INTRAVENOUS | Status: AC
  Administered 2012-10-22: 670 mg via INTRAVENOUS
  Filled 2012-10-22: qty 67

## 2012-10-22 NOTE — Progress Notes (Signed)
Hematology and Oncology Follow Up Visit  [name] [id number] 12/12/1955 57 y.o. 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 This was diagnosed in July of 2014.   Prior Therapy:  She is status post paracentesis performed on 09/17/2012 with the cytology confirmed the presence of adenocarcinoma and immunohistochemical stains suggest GYN etiology. She is also status post lumpectomy for breast cancer diagnosed 25 years ago followed by radiation and chemotherapy under the care of Dr. Cleone Slim. She did not receive any hormonal therapy.  Current therapy: Chemotherapy utilizing carboplatin and Taxotere started on 10/01/2012. She is here for cycle 2.  Interim History: [name] is a pleasant woman presented with peritoneal carcinomatosis and ascites and the diagnosis mentioned above. Since her last visit, she tolerated cycle 1 of chemotherapy without complications. She does report increase in her abdominal fullness and some loose bowel habits. Her appetite is down but still able to perform most activities of daily living. She had not reported any fevers or chills or sweats. She has not reported any shortness of breath or chest pain. She did not report any genitourinary or GYN bleeding.  Medications: I have reviewed the patient's current medications.  Current Outpatient Prescriptions  Medication Sig Dispense Refill  . ALPRAZolam (XANAX) 0.25 MG tablet Take 0.25 mg by mouth. 1-2 by mouth at bedtime as needed, Rx'ed by Dr.Taylor      . divalproex (DEPAKOTE ER) 500 MG 24 hr tablet Take 1,000 mg by mouth at bedtime.      Marland Kitchen glycopyrrolate (ROBINUL) 2 MG tablet Take 2 mg by mouth 2 (two) times daily.      Marland Kitchen lamoTRIgine (LAMICTAL) 25 MG tablet Take 50 mg by mouth daily.      Marland Kitchen levothyroxine (SYNTHROID, LEVOTHROID) 100 MCG  tablet Take 50-100 mcg by mouth daily before breakfast. Takes 1 tablet every day except Wednesday she takes 1/2 tablet      . lidocaine-prilocaine (EMLA) cream Apply topically as needed. Apply to port with every chemotherapy.  30 g  1  . ondansetron (ZOFRAN) 8 MG tablet Take 1 tablet (8 mg total) by mouth every 8 (eight) hours as needed for nausea.  20 tablet  0  . Probiotic Product (ALIGN PO) Take 1 tablet by mouth daily.       Marland Kitchen rOPINIRole (REQUIP) 1 MG tablet Take 1 mg by mouth at bedtime.      Marland Kitchen rOPINIRole (REQUIP) 1 MG tablet TAKE 1 TABLET BY MOUTH AT BEDTIME 3 HOURS BEFORE SLEEP  30 tablet  5   No current facility-administered medications for this visit.     Allergies: No Known Allergies  Past Medical History, Surgical history, Social history, and Family History were reviewed and updated.  Review of Systems: Remaining ROS negative. Physical Exam: Blood pressure 123/65, pulse 81, temperature 98 F (36.7 C), temperature source Oral, resp. rate 18, height 5\' 2"  (1.575 m), weight 167 lb (75.751 kg), SpO2 100.00%. ECOG:  General appearance: alert, cooperative and appears stated age Head: Normocephalic, without obvious abnormality, atraumatic Neck: no adenopathy, no carotid bruit, no JVD, supple, symmetrical, trachea midline and thyroid not enlarged, symmetric, no tenderness/mass/nodules Lymph nodes: Cervical, supraclavicular, and axillary nodes normal. Heart:regular rate and rhythm, S1, S2 normal, no murmur, click, rub or gallop Lung:chest clear, no wheezing, rales, normal symmetric air entry Abdomin: soft, distended and shifting dullness noted.  EXT:no erythema,  induration, or nodules   Lab Results: Lab Results  Component Value Date   WBC 5.8 10/22/2012   HGB 11.0* 10/22/2012   HCT 35.3 10/22/2012   MCV 86.3 10/22/2012   PLT 118* 10/22/2012     Chemistry      Component Value Date/Time   NA 139 10/01/2012 0923   NA 136 07/03/2012 1359   K 4.7 10/01/2012 0923   K 4.1 07/03/2012 1359    CL 103 07/03/2012 1359   CO2 24 10/01/2012 0923   CO2 30 07/03/2012 1359   BUN 12.3 10/01/2012 0923   BUN 9 07/03/2012 1359   CREATININE 0.7 10/01/2012 0923   CREATININE 0.7 07/03/2012 1359      Component Value Date/Time   CALCIUM 9.2 10/01/2012 0923   CALCIUM 9.6 07/03/2012 1359   ALKPHOS 54 10/01/2012 0923   ALKPHOS 80 07/03/2012 1359   AST 34 10/01/2012 0923   AST 42* 07/03/2012 1359   ALT 15 10/01/2012 0923   ALT 30 07/03/2012 1359   BILITOT 0.24 10/01/2012 0923   BILITOT 0.5 07/03/2012 1359         Impression and Plan:  57 year old woman with the following issues:  1. Peritoneal carcinomatosis with omental involvement. The pathology confirmed the presence of adenocarcinoma of likely GYN etiology. Her CA 125 is elevated at 333.5. She tolerated the first cycle of chemotherapy without any major complications. She is ready to proceed with the.    2. Neutropenia prophylaxis she will receive Neulasta after each cycle.  3. IV access: She had a Port-A-Cath inserted without any complications and she has Emla cream as well.   4. Ascites: An ultrasound of old that out at this time very limited fluids and no paracentesis was performed.  5. Nausea prophylaxis: She was given a prescription for Zofran.  6. Psychosocial support: Given her history of mental health disorder and given her options of counseling as well as social work services at this point she appeared to be compensated and reasonably in good mood.  7. Genetic counseling: She does have a rather strong maternal family history of early breast cancer as well as a personal history of breast cancer now she is developing possibly another cancer including omental carcinomatosis I think she would be of prime candidate for genetic counseling to identify a possible genetic disorder.  8. The followup: In 3 weeks for cycle 3 of chemotherapy.    [facility], MD 9/3/201410:56 AM

## 2012-10-22 NOTE — Patient Instructions (Signed)
[facility] Discharge Instructions for Patients Receiving Chemotherapy  Today you received the following chemotherapy agents TAXOTERE, CARBOPLATIN  To help prevent nausea and vomiting after your treatment, we encourage you to take your nausea medication MAY TAKE ABOUT 6 PM IF NEEDED.   If you develop nausea and vomiting that is not controlled by your nausea medication, call the clinic.   BELOW ARE SYMPTOMS THAT SHOULD BE REPORTED IMMEDIATELY:  *FEVER GREATER THAN 100.5 F  *CHILLS WITH OR WITHOUT FEVER  NAUSEA AND VOMITING THAT IS NOT CONTROLLED WITH YOUR NAUSEA MEDICATION  *UNUSUAL SHORTNESS OF BREATH  *UNUSUAL BRUISING OR BLEEDING  TENDERNESS IN MOUTH AND THROAT WITH OR WITHOUT PRESENCE OF ULCERS  *URINARY PROBLEMS  *BOWEL PROBLEMS  UNUSUAL RASH Items with * indicate a potential emergency and should be followed up as soon as possible.  Feel free to call the clinic you have any questions or concerns. The clinic phone number is [phone].

## 2012-10-22 NOTE — Progress Notes (Signed)
Letter dictated

## 2012-10-22 NOTE — Progress Notes (Signed)
Due to itching with first infusion I started today at 71 ml/hr for 15 min to watch for reaction. Pt had no reaction so rate increased to 285 cc/hr. Pt did well at full rate.

## 2012-10-23 ENCOUNTER — Ambulatory Visit (HOSPITAL_BASED_OUTPATIENT_CLINIC_OR_DEPARTMENT_OTHER): Payer: BC Managed Care – PPO

## 2012-10-23 VITALS — BP 128/63 | HR 75 | Temp 98.0°F

## 2012-10-23 DIAGNOSIS — C786 Secondary malignant neoplasm of retroperitoneum and peritoneum: Secondary | ICD-10-CM

## 2012-10-23 DIAGNOSIS — Z5189 Encounter for other specified aftercare: Secondary | ICD-10-CM

## 2012-10-23 DIAGNOSIS — C50919 Malignant neoplasm of unspecified site of unspecified female breast: Secondary | ICD-10-CM

## 2012-10-23 DIAGNOSIS — C801 Malignant (primary) neoplasm, unspecified: Secondary | ICD-10-CM

## 2012-10-23 MED ORDER — PEGFILGRASTIM INJECTION 6 MG/0.6ML
6.0000 mg | Freq: Once | SUBCUTANEOUS | Status: AC
  Administered 2012-10-23: 6 mg via SUBCUTANEOUS
  Filled 2012-10-23: qty 0.6

## 2012-10-23 NOTE — Letter (Signed)
October 22, 2012    [employer]  NAME:  [name], [name] MRN:  [id number] DOB:  [birth year]  To Whom It May Concern:  This is a Physicist, medical on behalf of [name].  [name] is a patient here at the [facility] under my direct care for the treatment of her cancer; I will continue to do so in the near future.  Should you have any questions, do not hesitate to contact me.  Sincerely yours,    Benjiman Core, M.D.  FNS/MEDQ  D:  10/22/2012  T:  10/22/2012  Job:  [id number]

## 2012-10-27 ENCOUNTER — Telehealth: Payer: Self-pay | Admitting: *Deleted

## 2012-10-27 NOTE — Telephone Encounter (Signed)
I do not see that this has been ordered again. I would encourage her to speak with FS at next visit as to how often he wishes to to check this.

## 2012-10-27 NOTE — Telephone Encounter (Signed)
THIS NOTE ROUTED TO KRISTIN CURCIO,APP-NP.

## 2012-10-27 NOTE — Telephone Encounter (Signed)
NOTIFIED [name] Coffel OF THE INFORMATION BELOW. ALSO INFORMED MR.Mclester THAT DR.SHADAD WILL SIGN LETTER TOMORROW WHICH HE CAN PICK UP AT THE FRONT DESK.

## 2012-10-28 ENCOUNTER — Telehealth: Payer: Self-pay | Admitting: *Deleted

## 2012-10-28 NOTE — Telephone Encounter (Signed)
Let patient know that requested letter was ready for p/u at front desk.

## 2012-10-29 ENCOUNTER — Telehealth: Payer: Self-pay | Admitting: *Deleted

## 2012-10-29 NOTE — Telephone Encounter (Signed)
Patient calling to say her stomach feels bloated and is uncomfortable. Per dr Clelia Croft, this will improve with time and it is due to the cancer. Husband craig verbalized understanding.

## 2012-11-03 ENCOUNTER — Encounter: Payer: Self-pay | Admitting: Oncology

## 2012-11-03 NOTE — Progress Notes (Signed)
Letter dictated

## 2012-11-05 ENCOUNTER — Telehealth: Payer: Self-pay | Admitting: *Deleted

## 2012-11-05 NOTE — Telephone Encounter (Signed)
Dr Clelia Croft dictated a letter for [name], to be excused from work d/t chemotherapy treatments, on 11/03/12. It has not shown up in epic yet. Will check again on 11/07/12

## 2012-11-07 ENCOUNTER — Telehealth: Payer: Self-pay | Admitting: *Deleted

## 2012-11-07 NOTE — Telephone Encounter (Signed)
Husband craig calling to ask if dictated letter is ready for patient, excusing her from work. Per dr Clelia Croft, it was dictated on 11/03/12.   But, has not been  transcribed.

## 2012-11-12 ENCOUNTER — Encounter: Payer: Self-pay | Admitting: Oncology

## 2012-11-12 ENCOUNTER — Telehealth: Payer: Self-pay | Admitting: *Deleted

## 2012-11-12 ENCOUNTER — Other Ambulatory Visit (HOSPITAL_BASED_OUTPATIENT_CLINIC_OR_DEPARTMENT_OTHER): Payer: BC Managed Care – PPO | Admitting: Lab

## 2012-11-12 ENCOUNTER — Ambulatory Visit: Payer: BC Managed Care – PPO

## 2012-11-12 ENCOUNTER — Ambulatory Visit (HOSPITAL_BASED_OUTPATIENT_CLINIC_OR_DEPARTMENT_OTHER): Payer: BC Managed Care – PPO | Admitting: Oncology

## 2012-11-12 ENCOUNTER — Encounter: Payer: Self-pay | Admitting: *Deleted

## 2012-11-12 ENCOUNTER — Telehealth: Payer: Self-pay | Admitting: Oncology

## 2012-11-12 VITALS — BP 118/65 | HR 78 | Temp 97.8°F | Resp 18 | Ht 62.0 in | Wt 171.1 lb

## 2012-11-12 DIAGNOSIS — C786 Secondary malignant neoplasm of retroperitoneum and peritoneum: Secondary | ICD-10-CM

## 2012-11-12 DIAGNOSIS — C801 Malignant (primary) neoplasm, unspecified: Secondary | ICD-10-CM

## 2012-11-12 DIAGNOSIS — C50919 Malignant neoplasm of unspecified site of unspecified female breast: Secondary | ICD-10-CM

## 2012-11-12 DIAGNOSIS — D696 Thrombocytopenia, unspecified: Secondary | ICD-10-CM

## 2012-11-12 DIAGNOSIS — R180 Malignant ascites: Secondary | ICD-10-CM

## 2012-11-12 LAB — CBC WITH DIFFERENTIAL/PLATELET
Basophils Absolute: 0 10*3/uL (ref 0.0–0.1)
EOS%: 0 % (ref 0.0–7.0)
Eosinophils Absolute: 0 10*3/uL (ref 0.0–0.5)
HCT: 31.2 % — ABNORMAL LOW (ref 34.8–46.6)
HGB: 9.7 g/dL — ABNORMAL LOW (ref 11.6–15.9)
MCH: 27.3 pg (ref 25.1–34.0)
MCV: 87.9 fL (ref 79.5–101.0)
MONO%: 11.9 % (ref 0.0–14.0)
NEUT#: 2.6 10*3/uL (ref 1.5–6.5)
NEUT%: 58.9 % (ref 38.4–76.8)
RDW: 17.5 % — ABNORMAL HIGH (ref 11.2–14.5)

## 2012-11-12 MED ORDER — DIPHENOXYLATE-ATROPINE 2.5-0.025 MG PO TABS: 1.0000 | ORAL_TABLET | Freq: Four times a day (QID) | ORAL | Status: DC | PRN

## 2012-11-12 NOTE — Telephone Encounter (Signed)
Per staff message and POF I have scheduled appts.  JMW  

## 2012-11-12 NOTE — Telephone Encounter (Signed)
Gave pt appt for October lab and MD emailed Marcelino Duster regarding chemo for October

## 2012-11-12 NOTE — Progress Notes (Unsigned)
Dictated letter for patient to be excused from work, left at front for patient p/u.

## 2012-11-12 NOTE — Progress Notes (Signed)
Hematology and Oncology Follow Up Visit  [name] [id number] 01-01-1956 57 y.o. 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 This was diagnosed in July of 2014.   Prior Therapy:  She is status post paracentesis performed on 09/17/2012 with the cytology confirmed the presence of adenocarcinoma and immunohistochemical stains suggest GYN etiology. She is also status post lumpectomy for breast cancer diagnosed 25 years ago followed by radiation and chemotherapy under the care of Dr. Cleone Slim. She did not receive any hormonal therapy.  Current therapy: Chemotherapy utilizing carboplatin and Taxotere started on 10/01/2012. She is here for cycle 3.  Interim History: [name] is a pleasant woman presented with peritoneal carcinomatosis and ascites and the diagnosis mentioned above. Since her last visit, she tolerated cycle 2 of chemotherapy with few complications: She reports grade 1 diarrhea, grade 1 fatigue but no fevers or chills. She has reported also grade 1 neuropathy. She does report decrease in her abdominal fullness and some loose bowel habits. Her appetite is down but still able to perform most activities of daily living. She had not reported any fevers or chills or sweats. She has not reported any shortness of breath or chest pain. She did not report any genitourinary or GYN bleeding. She did report increased abdominal cramping with her diarrhea and probably the most distressing complaint today.  Medications: I have reviewed the patient's current medications.  Current Outpatient Prescriptions  Medication Sig Dispense Refill  . ALPRAZolam (XANAX) 0.25 MG tablet Take 0.25 mg by mouth. 1-2 by mouth at bedtime as needed, Rx'ed by Dr.Taylor      . divalproex (DEPAKOTE ER) 500 MG 24 hr tablet Take 1,000 mg by  mouth at bedtime.      Marland Kitchen glycopyrrolate (ROBINUL) 2 MG tablet Take 2 mg by mouth 2 (two) times daily.      Marland Kitchen lamoTRIgine (LAMICTAL) 25 MG tablet Take 50 mg by mouth daily.      Marland Kitchen levothyroxine (SYNTHROID, LEVOTHROID) 100 MCG tablet Take 50-100 mcg by mouth daily before breakfast. Takes 1 tablet every day except Wednesday she takes 1/2 tablet      . lidocaine-prilocaine (EMLA) cream Apply topically as needed. Apply to port with every chemotherapy.  30 g  1  . ondansetron (ZOFRAN) 8 MG tablet Take 1 tablet (8 mg total) by mouth every 8 (eight) hours as needed for nausea.  20 tablet  0  . Probiotic Product (ALIGN PO) Take 1 tablet by mouth daily.       Marland Kitchen rOPINIRole (REQUIP) 1 MG tablet Take 1 mg by mouth at bedtime.      Marland Kitchen rOPINIRole (REQUIP) 1 MG tablet TAKE 1 TABLET BY MOUTH AT BEDTIME 3 HOURS BEFORE SLEEP  30 tablet  5  . diphenoxylate-atropine (LOMOTIL) 2.5-0.025 MG per tablet Take 1 tablet by mouth 4 (four) times daily as needed for diarrhea or loose stools.  30 tablet  0   No current facility-administered medications for this visit.     Allergies: No Known Allergies  Past Medical History, Surgical history, Social history, and Family History were reviewed and updated.  Review of Systems: Remaining ROS negative. Physical Exam: Blood pressure 118/65, pulse 78, temperature 97.8 F (36.6 C), temperature source Oral, resp. rate 18, height 5\' 2"  (1.575 m), weight 171 lb 1.6 oz (77.61 kg). ECOG:  General appearance: alert, cooperative and appears stated  age Head: Normocephalic, without obvious abnormality, atraumatic Neck: no adenopathy, no carotid bruit, no JVD, supple, symmetrical, trachea midline and thyroid not enlarged, symmetric, no tenderness/mass/nodules Lymph nodes: Cervical, supraclavicular, and axillary nodes normal. Heart:regular rate and rhythm, S1, S2 normal, no murmur, click, rub or gallop Lung:chest clear, no wheezing, rales, normal symmetric air entry Abdomin: soft,  distended and shifting dullness noted.  EXT:no erythema, induration, or nodules   Lab Results: Lab Results  Component Value Date   WBC 4.5 11/12/2012   HGB 9.7* 11/12/2012   HCT 31.2* 11/12/2012   MCV 87.9 11/12/2012   PLT 63* 11/12/2012     Chemistry      Component Value Date/Time   NA 138 10/22/2012 1012   NA 136 07/03/2012 1359   K 4.5 10/22/2012 1012   K 4.1 07/03/2012 1359   CL 103 07/03/2012 1359   CO2 22 10/22/2012 1012   CO2 30 07/03/2012 1359   BUN 12.2 10/22/2012 1012   BUN 9 07/03/2012 1359   CREATININE 0.7 10/22/2012 1012   CREATININE 0.7 07/03/2012 1359      Component Value Date/Time   CALCIUM 9.1 10/22/2012 1012   CALCIUM 9.6 07/03/2012 1359   ALKPHOS 67 10/22/2012 1012   ALKPHOS 80 07/03/2012 1359   AST 26 10/22/2012 1012   AST 42* 07/03/2012 1359   ALT 11 10/22/2012 1012   ALT 30 07/03/2012 1359   BILITOT 0.26 10/22/2012 1012   BILITOT 0.5 07/03/2012 1359         Impression and Plan:  57 year old woman with the following issues:  1. Peritoneal carcinomatosis with omental involvement. The pathology confirmed the presence of adenocarcinoma of likely GYN etiology. Her CA 125 is elevated at 333.5. She tolerated the first 2 cycle of chemotherapy without any major complications. Due to her thrombocytopenia I will postpone her chemotherapy for one week and after the completion of her chemotherapy start cycle I will obtain a PET CT scan.   2. Neutropenia prophylaxis she will receive Neulasta after each cycle.  3. IV access: She had a Port-A-Cath inserted without any complications and she has Emla cream as well.   4. diarrhea: I've given a prescription for Lomotil to decrease her diarrhea and cramping symptoms.  5. Nausea prophylaxis: She was given a prescription for Zofran.  6. Psychosocial support: Given her history of mental health disorder and given her options of counseling as well as social work services at this point she appeared to be compensated and reasonably in good  mood.  7. Genetic counseling: She does have a rather strong maternal family history of early breast cancer as well as a personal history of breast cancer now she is developing possibly another cancer including omental carcinomatosis I think she would be of prime candidate for genetic counseling to identify a possible genetic disorder.  8. The followup: In 4 weeks for cycle 4 of chemotherapy.    [facility], MD 9/24/20149:38 AM

## 2012-11-12 NOTE — Letter (Signed)
November 03, 2012     NAME:  [name], [name] MRN:  [id number] DOB:  17-May-1955  To whom it may concern:  The patient is a patient here at the [facility] and she is currently under going treatment for her condition.  Due to her diagnosis and treatment for cancer she is unable to perform work-related duties for the foreseeable future.  Should he have any questions regarding this letter do not hesitate to contact me.  Sincerely yours,     Benjiman Core, M.D.  FNS/MEDQ  D:  11/03/2012  T:  11/03/2012  Job:  [id number]

## 2012-11-13 ENCOUNTER — Encounter: Payer: Self-pay | Admitting: Oncology

## 2012-11-13 ENCOUNTER — Ambulatory Visit: Payer: BC Managed Care – PPO

## 2012-11-13 ENCOUNTER — Telehealth: Payer: Self-pay | Admitting: *Deleted

## 2012-11-13 ENCOUNTER — Telehealth: Payer: Self-pay | Admitting: Oncology

## 2012-11-13 NOTE — Telephone Encounter (Signed)
Per Marshal, the letter written by dr Clelia Croft stating she was unable to work d/t chemotherapy treatments, would not suffice. Will need to mail copy of records to  Attn: [employer], [address], [address], [place], [place]. [address]. This information was given to medical records to mail copy of chart.

## 2012-11-13 NOTE — Telephone Encounter (Signed)
Talked to pt and gave her all appts for October 2014 lab, Md and chemo

## 2012-11-14 ENCOUNTER — Telehealth: Payer: Self-pay | Admitting: Oncology

## 2012-11-14 NOTE — Telephone Encounter (Signed)
Mailed pt medical records to Disability Adjudication Review

## 2012-11-18 ENCOUNTER — Encounter: Payer: Self-pay | Admitting: Oncology

## 2012-11-19 ENCOUNTER — Ambulatory Visit (HOSPITAL_BASED_OUTPATIENT_CLINIC_OR_DEPARTMENT_OTHER): Payer: BC Managed Care – PPO

## 2012-11-19 ENCOUNTER — Other Ambulatory Visit (HOSPITAL_BASED_OUTPATIENT_CLINIC_OR_DEPARTMENT_OTHER): Payer: BC Managed Care – PPO | Admitting: Lab

## 2012-11-19 VITALS — BP 110/59 | HR 74 | Temp 98.1°F | Resp 20

## 2012-11-19 DIAGNOSIS — C786 Secondary malignant neoplasm of retroperitoneum and peritoneum: Secondary | ICD-10-CM

## 2012-11-19 DIAGNOSIS — C801 Malignant (primary) neoplasm, unspecified: Secondary | ICD-10-CM

## 2012-11-19 DIAGNOSIS — C50919 Malignant neoplasm of unspecified site of unspecified female breast: Secondary | ICD-10-CM

## 2012-11-19 DIAGNOSIS — Z5111 Encounter for antineoplastic chemotherapy: Secondary | ICD-10-CM

## 2012-11-19 LAB — COMPREHENSIVE METABOLIC PANEL (CC13)
AST: 31 U/L (ref 5–34)
Albumin: 3.2 g/dL — ABNORMAL LOW (ref 3.5–5.0)
Alkaline Phosphatase: 62 U/L (ref 40–150)
BUN: 16.4 mg/dL (ref 7.0–26.0)
Calcium: 9.4 mg/dL (ref 8.4–10.4)
Chloride: 106 mEq/L (ref 98–109)
Creatinine: 0.7 mg/dL (ref 0.6–1.1)
Glucose: 104 mg/dl (ref 70–140)
Potassium: 4.8 mEq/L (ref 3.5–5.1)

## 2012-11-19 LAB — CBC WITH DIFFERENTIAL/PLATELET
Basophils Absolute: 0 10*3/uL (ref 0.0–0.1)
Eosinophils Absolute: 0 10*3/uL (ref 0.0–0.5)
HCT: 31.4 % — ABNORMAL LOW (ref 34.8–46.6)
HGB: 10.3 g/dL — ABNORMAL LOW (ref 11.6–15.9)
NEUT#: 2.8 10*3/uL (ref 1.5–6.5)
NEUT%: 67.3 % (ref 38.4–76.8)
RDW: 19.2 % — ABNORMAL HIGH (ref 11.2–14.5)
lymph#: 0.9 10*3/uL (ref 0.9–3.3)

## 2012-11-19 MED ORDER — SODIUM CHLORIDE 0.9 % IV SOLN
Freq: Once | INTRAVENOUS | Status: AC
  Administered 2012-11-19: 10:00:00 via INTRAVENOUS

## 2012-11-19 MED ORDER — SODIUM CHLORIDE 0.9 % IV SOLN
530.0000 mg | Freq: Once | INTRAVENOUS | Status: AC
  Administered 2012-11-19: 530 mg via INTRAVENOUS
  Filled 2012-11-19: qty 53

## 2012-11-19 MED ORDER — DOCETAXEL CHEMO INJECTION 160 MG/16ML
60.0000 mg/m2 | Freq: Once | INTRAVENOUS | Status: AC
  Administered 2012-11-19: 110 mg via INTRAVENOUS
  Filled 2012-11-19: qty 11

## 2012-11-19 MED ORDER — HEPARIN SOD (PORK) LOCK FLUSH 100 UNIT/ML IV SOLN
500.0000 [IU] | Freq: Once | INTRAVENOUS | Status: AC | PRN
  Administered 2012-11-19: 500 [IU]
  Filled 2012-11-19: qty 5

## 2012-11-19 MED ORDER — SODIUM CHLORIDE 0.9 % IJ SOLN
10.0000 mL | INTRAMUSCULAR | Status: DC | PRN
  Administered 2012-11-19: 10 mL
  Filled 2012-11-19: qty 10

## 2012-11-19 MED ORDER — ONDANSETRON 16 MG/50ML IVPB (CHCC)
16.0000 mg | Freq: Once | INTRAVENOUS | Status: AC
  Administered 2012-11-19: 16 mg via INTRAVENOUS

## 2012-11-19 MED ORDER — ONDANSETRON 16 MG/50ML IVPB (CHCC)
INTRAVENOUS | Status: AC
  Filled 2012-11-19: qty 16

## 2012-11-19 MED ORDER — DEXAMETHASONE SODIUM PHOSPHATE 20 MG/5ML IJ SOLN
INTRAMUSCULAR | Status: AC
  Filled 2012-11-19: qty 5

## 2012-11-19 MED ORDER — DEXAMETHASONE SODIUM PHOSPHATE 20 MG/5ML IJ SOLN
20.0000 mg | Freq: Once | INTRAMUSCULAR | Status: AC
  Administered 2012-11-19: 20 mg via INTRAVENOUS

## 2012-11-19 NOTE — Patient Instructions (Addendum)
[facility] Discharge Instructions for Patients Receiving Chemotherapy  Today you received the following chemotherapy agents: Taxotere, Carboplatin   To help prevent nausea and vomiting after your treatment, we encourage you to take your nausea medication as prescribed. You received zofran and Dexmethasone IV at 10:00 AM in the infusion room.    If you develop nausea and vomiting that is not controlled by your nausea medication, call the clinic.   BELOW ARE SYMPTOMS THAT SHOULD BE REPORTED IMMEDIATELY:  *FEVER GREATER THAN 100.5 F  *CHILLS WITH OR WITHOUT FEVER  NAUSEA AND VOMITING THAT IS NOT CONTROLLED WITH YOUR NAUSEA MEDICATION  *UNUSUAL SHORTNESS OF BREATH  *UNUSUAL BRUISING OR BLEEDING  TENDERNESS IN MOUTH AND THROAT WITH OR WITHOUT PRESENCE OF ULCERS  *URINARY PROBLEMS  *BOWEL PROBLEMS  UNUSUAL RASH Items with * indicate a potential emergency and should be followed up as soon as possible.  Feel free to call the clinic you have any questions or concerns. The clinic phone number is [phone].

## 2012-11-20 ENCOUNTER — Ambulatory Visit (HOSPITAL_BASED_OUTPATIENT_CLINIC_OR_DEPARTMENT_OTHER): Payer: BC Managed Care – PPO

## 2012-11-20 VITALS — BP 121/76 | HR 79 | Temp 97.7°F

## 2012-11-20 DIAGNOSIS — C786 Secondary malignant neoplasm of retroperitoneum and peritoneum: Secondary | ICD-10-CM

## 2012-11-20 DIAGNOSIS — C50919 Malignant neoplasm of unspecified site of unspecified female breast: Secondary | ICD-10-CM

## 2012-11-20 DIAGNOSIS — C801 Malignant (primary) neoplasm, unspecified: Secondary | ICD-10-CM

## 2012-11-20 DIAGNOSIS — Z5189 Encounter for other specified aftercare: Secondary | ICD-10-CM

## 2012-11-20 MED ORDER — PEGFILGRASTIM INJECTION 6 MG/0.6ML
6.0000 mg | Freq: Once | SUBCUTANEOUS | Status: AC
  Administered 2012-11-20: 6 mg via SUBCUTANEOUS
  Filled 2012-11-20: qty 0.6

## 2012-11-24 ENCOUNTER — Encounter: Payer: Self-pay | Admitting: Oncology

## 2012-11-25 ENCOUNTER — Telehealth: Payer: Self-pay | Admitting: *Deleted

## 2012-11-25 ENCOUNTER — Other Ambulatory Visit: Payer: Self-pay | Admitting: *Deleted

## 2012-11-25 MED ORDER — GABAPENTIN 100 MG PO CAPS: 100.0000 mg | ORAL_CAPSULE | Freq: Three times a day (TID) | ORAL | Status: DC

## 2012-11-25 NOTE — Telephone Encounter (Signed)
Rx for Gabapentin sent to pt's pharmacy for toe neuropathy. Notified pt.

## 2012-11-25 NOTE — Telephone Encounter (Signed)
MyChart request given to provider.  Per Dr. Clelia Croft, he will contact patient about the neuropathy reported in this message.

## 2012-12-06 ENCOUNTER — Encounter: Payer: Self-pay | Admitting: Oncology

## 2012-12-09 ENCOUNTER — Encounter (HOSPITAL_COMMUNITY): Payer: Self-pay

## 2012-12-09 ENCOUNTER — Encounter (HOSPITAL_COMMUNITY)
Admission: RE | Admit: 2012-12-09 | Discharge: 2012-12-09 | Disposition: A | Discharge status: Home / Self Care | Payer: BC Managed Care – PPO | Source: Ambulatory Visit | Attending: Oncology | Admitting: Oncology

## 2012-12-09 ENCOUNTER — Other Ambulatory Visit (HOSPITAL_BASED_OUTPATIENT_CLINIC_OR_DEPARTMENT_OTHER): Payer: BC Managed Care – PPO | Admitting: Lab

## 2012-12-09 DIAGNOSIS — C786 Secondary malignant neoplasm of retroperitoneum and peritoneum: Secondary | ICD-10-CM

## 2012-12-09 DIAGNOSIS — C569 Malignant neoplasm of unspecified ovary: Secondary | ICD-10-CM | POA: Insufficient documentation

## 2012-12-09 DIAGNOSIS — C801 Malignant (primary) neoplasm, unspecified: Secondary | ICD-10-CM

## 2012-12-09 LAB — CBC WITH DIFFERENTIAL/PLATELET
Basophils Absolute: 0 10*3/uL (ref 0.0–0.1)
Eosinophils Absolute: 0 10*3/uL (ref 0.0–0.5)
HCT: 30.4 % — ABNORMAL LOW (ref 34.8–46.6)
HGB: 9.7 g/dL — ABNORMAL LOW (ref 11.6–15.9)
LYMPH%: 23.5 % (ref 14.0–49.7)
MCV: 89.4 fL (ref 79.5–101.0)
MONO#: 0.5 10*3/uL (ref 0.1–0.9)
NEUT#: 2.8 10*3/uL (ref 1.5–6.5)
NEUT%: 63.1 % (ref 38.4–76.8)
Platelets: 86 10*3/uL — ABNORMAL LOW (ref 145–400)
WBC: 4.5 10*3/uL (ref 3.9–10.3)

## 2012-12-09 LAB — COMPREHENSIVE METABOLIC PANEL (CC13)
ALT: 11 U/L (ref 0–55)
AST: 24 U/L (ref 5–34)
Albumin: 3.1 g/dL — ABNORMAL LOW (ref 3.5–5.0)
Alkaline Phosphatase: 64 U/L (ref 40–150)
CO2: 24 mEq/L (ref 22–29)
Calcium: 9.7 mg/dL (ref 8.4–10.4)
Creatinine: 0.7 mg/dL (ref 0.6–1.1)
Sodium: 139 mEq/L (ref 136–145)

## 2012-12-09 MED ORDER — FLUDEOXYGLUCOSE F - 18 (FDG) INJECTION
18.6000 | Freq: Once | INTRAVENOUS | Status: AC | PRN
  Administered 2012-12-09: 18.6 via INTRAVENOUS

## 2012-12-10 ENCOUNTER — Telehealth: Payer: Self-pay | Admitting: *Deleted

## 2012-12-10 ENCOUNTER — Ambulatory Visit (HOSPITAL_BASED_OUTPATIENT_CLINIC_OR_DEPARTMENT_OTHER): Payer: BC Managed Care – PPO | Admitting: Oncology

## 2012-12-10 ENCOUNTER — Other Ambulatory Visit: Payer: BC Managed Care – PPO | Admitting: Lab

## 2012-12-10 ENCOUNTER — Telehealth: Payer: Self-pay | Admitting: Oncology

## 2012-12-10 ENCOUNTER — Ambulatory Visit: Payer: BC Managed Care – PPO

## 2012-12-10 VITALS — BP 131/79 | HR 73 | Temp 97.2°F | Resp 20 | Ht 62.0 in | Wt 168.4 lb

## 2012-12-10 DIAGNOSIS — C801 Malignant (primary) neoplasm, unspecified: Secondary | ICD-10-CM

## 2012-12-10 DIAGNOSIS — D696 Thrombocytopenia, unspecified: Secondary | ICD-10-CM

## 2012-12-10 DIAGNOSIS — Z803 Family history of malignant neoplasm of breast: Secondary | ICD-10-CM

## 2012-12-10 DIAGNOSIS — Z853 Personal history of malignant neoplasm of breast: Secondary | ICD-10-CM

## 2012-12-10 DIAGNOSIS — C786 Secondary malignant neoplasm of retroperitoneum and peritoneum: Secondary | ICD-10-CM

## 2012-12-10 NOTE — Telephone Encounter (Signed)
Per staff message and POF I have scheduled appts.  JMW  

## 2012-12-10 NOTE — Progress Notes (Signed)
Hematology and Oncology Follow Up Visit  [name] [id number] [birth year] 57 y.o. 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 This was diagnosed in July of 2014.   Prior Therapy:  She is status post paracentesis performed on 09/17/2012 with the cytology confirmed the presence of adenocarcinoma and immunohistochemical stains suggest GYN etiology. She is also status post lumpectomy for breast cancer diagnosed 25 years ago followed by radiation and chemotherapy under the care of Dr. Cleone Slim. She did not receive any hormonal therapy.  Current therapy: Chemotherapy utilizing carboplatin and Taxotere started on 10/01/2012. She is here for cycle 4.  Interim History: [name] is a pleasant woman presented with peritoneal carcinomatosis and ascites and the diagnosis mentioned above. Since her last visit, she tolerated cycle 3 of chemotherapy with few complications: She reports grade 1 diarrhea, grade 1 fatigue but no fevers or chills. She has reported also grade 1 neuropathy. She does report decrease in her abdominal fullness and some loose bowel habits. Her appetite is down but still able to perform most activities of daily living. She had not reported any fevers or chills or sweats. She has not reported any shortness of breath or chest pain. She did not report any genitourinary or GYN bleeding. She hasn't reported significant improvement with arthralgias and myalgias after taking Claritin before her last Neulasta.  Medications: I have reviewed the patient's current medications.  Current Outpatient Prescriptions  Medication Sig Dispense Refill  . ALPRAZolam (XANAX) 0.25 MG tablet Take 0.25 mg by mouth. 1-2 by mouth at bedtime as needed, Rx'ed by Dr.Taylor      . diphenoxylate-atropine (LOMOTIL) 2.5-0.025 MG per tablet  Take 1 tablet by mouth 4 (four) times daily as needed for diarrhea or loose stools.  30 tablet  0  . divalproex (DEPAKOTE ER) 500 MG 24 hr tablet Take 1,000 mg by mouth at bedtime.      . gabapentin (NEURONTIN) 100 MG capsule Take 1 capsule (100 mg total) by mouth 3 (three) times daily.  90 capsule  0  . glycopyrrolate (ROBINUL) 2 MG tablet Take 2 mg by mouth 2 (two) times daily.      Marland Kitchen lamoTRIgine (LAMICTAL) 25 MG tablet Take 50 mg by mouth daily.      Marland Kitchen levothyroxine (SYNTHROID, LEVOTHROID) 100 MCG tablet Take 50-100 mcg by mouth daily before breakfast. Takes 1 tablet every day except Wednesday she takes 1/2 tablet      . lidocaine-prilocaine (EMLA) cream Apply topically as needed. Apply to port with every chemotherapy.  30 g  1  . ondansetron (ZOFRAN) 8 MG tablet Take 1 tablet (8 mg total) by mouth every 8 (eight) hours as needed for nausea.  20 tablet  0  . Probiotic Product (ALIGN PO) Take 1 tablet by mouth daily.       Marland Kitchen rOPINIRole (REQUIP) 1 MG tablet Take 1 mg by mouth at bedtime.      Marland Kitchen rOPINIRole (REQUIP) 1 MG tablet TAKE 1 TABLET BY MOUTH AT BEDTIME 3 HOURS BEFORE SLEEP  30 tablet  5   No current facility-administered medications for this visit.     Allergies: No Known Allergies  Past Medical History, Surgical history, Social history, and Family History were reviewed and updated.  Review of Systems: Remaining ROS negative. Physical Exam: Blood pressure 131/79, pulse 73, temperature 97.2 F (36.2 C), temperature source Oral,  resp. rate 20, height 5\' 2"  (1.575 m), weight 168 lb 6.4 oz (76.386 kg). ECOG: 1 General appearance: alert, cooperative and appears stated age Head: Normocephalic, without obvious abnormality, atraumatic Neck: no adenopathy, no carotid bruit, no JVD, supple, symmetrical, trachea midline and thyroid not enlarged, symmetric, no tenderness/mass/nodules Lymph nodes: Cervical, supraclavicular, and axillary nodes normal. Heart:regular rate and rhythm, S1, S2  normal, no murmur, click, rub or gallop Lung:chest clear, no wheezing, rales, normal symmetric air entry Abdomin: soft, distended and shifting dullness noted.  it was not tender to deep palpation. EXT:no erythema, induration, or nodules   Lab Results: Lab Results  Component Value Date   WBC 4.5 12/09/2012   HGB 9.7* 12/09/2012   HCT 30.4* 12/09/2012   MCV 89.4 12/09/2012   PLT 86* 12/09/2012     Chemistry      Component Value Date/Time   NA 139 12/09/2012 1131   NA 136 07/03/2012 1359   K 4.8 12/09/2012 1131   K 4.1 07/03/2012 1359   CL 103 07/03/2012 1359   CO2 24 12/09/2012 1131   CO2 30 07/03/2012 1359   BUN 14.5 12/09/2012 1131   BUN 9 07/03/2012 1359   CREATININE 0.7 12/09/2012 1131   CREATININE 0.7 07/03/2012 1359      Component Value Date/Time   CALCIUM 9.7 12/09/2012 1131   CALCIUM 9.6 07/03/2012 1359   ALKPHOS 64 12/09/2012 1131   ALKPHOS 80 07/03/2012 1359   AST 24 12/09/2012 1131   AST 42* 07/03/2012 1359   ALT 11 12/09/2012 1131   ALT 30 07/03/2012 1359   BILITOT 0.23 12/09/2012 1131   BILITOT 0.5 07/03/2012 1359     EXAM:  NUCLEAR MEDICINE PET SKULL BASE TO THIGH  FASTING BLOOD GLUCOSE: Value: 83 mg/dl  TECHNIQUE:  57.8 mCi I-69 FDG was injected intravenously. CT data was obtained  and used for attenuation correction and anatomic localization only.  (This was not acquired as a diagnostic CT examination.) Additional  exam technical data entered on technologist worksheet.  COMPARISON: 09/23/2012  FINDINGS:  NECK  No hypermetabolic lymph nodes in the neck.  CHEST  No hypermetabolic mediastinal or hilar nodes. No suspicious  pulmonary nodules on the CT scan. Small left pleural effusion is  again identified and appears similar to previous exam.  ABDOMEN/PELVIS  No abnormal hypermetabolic activity within the liver, pancreas,  adrenal glands, or spleen. Again identified is extensive  hypermetabolic peritoneal tumor involving the abdomen and pelvis.  Index  omental cake within the left hemiabdomen measures 11 x 3.6 cm  and has an SUV max equal to 7.3. This is similar in appearance to  the previous exam. Just below the inferior margin of the right  hepatic lobe there is a peritoneal implant measuring 6.9 x 7.5 cm.  This has an SUV max equal 6.2, image 149. On the previous  examination this measured 8.0 x 7.5 cm and had an SUV max equal 7.1.  No hypermetabolic retroperitoneal lymph nodes. No pelvic or inguinal  adenopathy identified. The stomach and small bowel loops are within  normal limits. The colon has a normal caliber and there is no  evidence for bowel obstruction.  SKELETON  No focal hypermetabolic activity to suggest skeletal metastasis.  IMPRESSION:  1. Persistent hypermetabolic peritoneal carcinomatosis. When  compared with the previous examination there has been no significant  change in the degree of FDG uptake associated with the bulky  peritoneal tumor. The overall volume of disease within the  peritoneal cavity is  not significantly improved in the interval.  2. Persistent marked ascites within the abdomen and pelvis.  3. No change in small left pleural effusion      Impression and Plan:  57 year old woman with the following issues:  1. Peritoneal carcinomatosis with omental involvement. The pathology confirmed the presence of adenocarcinoma of likely GYN etiology. Her CA 125 is elevated at 333.5. She tolerated the first 3 cycle of chemotherapy without any major complications. PET CT scan results were discussed today and showed predominantly stable disease without really any dramatic improvements. Her counts today indicate mild thrombocytopenia and will defer her treatment will next week. I discussed with her the options of continuing with the current regimen versus switching her to a different combination given the lack of response at this time. She is scheduled to see Dr. Stanford Breed again this week and I will get his  opinion regarding the best approach here.   2. Neutropenia prophylaxis she will receive Neulasta after each cycle.  3. IV access: She had a Port-A-Cath inserted without any complications and she has Emla cream as well.   4. diarrhea: Says improved at this time  5. Nausea prophylaxis: She was given a prescription for Zofran.  6. Psychosocial support: Given her history of mental health disorder and given her options of counseling as well as social work services at this point she appeared to be compensated and reasonably in good mood.  7. Genetic counseling: She does have a rather strong maternal family history of early breast cancer as well as a personal history of breast cancer now she is developing possibly another cancer including omental carcinomatosis I think she would be of prime candidate for genetic counseling to identify a possible genetic disorder.  8. The followup: In one week for the resumption of chemotherapy.    [facility], MD 10/22/20149:54 AM

## 2012-12-10 NOTE — Telephone Encounter (Signed)
gva nd printed appt sched and avs forpt for OCT adn NOV...emailed MW to add tx.

## 2012-12-11 ENCOUNTER — Ambulatory Visit: Payer: BC Managed Care – PPO

## 2012-12-12 ENCOUNTER — Encounter: Payer: Self-pay | Admitting: Gynecology

## 2012-12-12 ENCOUNTER — Ambulatory Visit: Payer: BC Managed Care – PPO | Attending: Gynecology | Admitting: Gynecology

## 2012-12-12 VITALS — BP 127/71 | HR 80 | Temp 98.1°F | Resp 16 | Ht 60.43 in | Wt 168.3 lb

## 2012-12-12 DIAGNOSIS — M899 Disorder of bone, unspecified: Secondary | ICD-10-CM | POA: Insufficient documentation

## 2012-12-12 DIAGNOSIS — C482 Malignant neoplasm of peritoneum, unspecified: Secondary | ICD-10-CM

## 2012-12-12 DIAGNOSIS — E119 Type 2 diabetes mellitus without complications: Secondary | ICD-10-CM | POA: Insufficient documentation

## 2012-12-12 DIAGNOSIS — Z853 Personal history of malignant neoplasm of breast: Secondary | ICD-10-CM | POA: Insufficient documentation

## 2012-12-12 DIAGNOSIS — C786 Secondary malignant neoplasm of retroperitoneum and peritoneum: Secondary | ICD-10-CM | POA: Insufficient documentation

## 2012-12-12 DIAGNOSIS — Z803 Family history of malignant neoplasm of breast: Secondary | ICD-10-CM | POA: Insufficient documentation

## 2012-12-12 DIAGNOSIS — E039 Hypothyroidism, unspecified: Secondary | ICD-10-CM | POA: Insufficient documentation

## 2012-12-12 DIAGNOSIS — C801 Malignant (primary) neoplasm, unspecified: Secondary | ICD-10-CM | POA: Insufficient documentation

## 2012-12-12 DIAGNOSIS — Z79899 Other long term (current) drug therapy: Secondary | ICD-10-CM | POA: Insufficient documentation

## 2012-12-12 NOTE — Patient Instructions (Addendum)
Plan to call and arrange an appointment when you are scheduled for your third cycle.  Please call for any questions or concerns.  CA-125 Tumor  Marker CA 125 is a tumor marker that is used to help monitor the course of ovarian or endometrial cancer. PREPARATION FOR TEST No preparation is necessary. NORMAL FINDINGS Adults: 0-35 units/mL (0-35 kilounits)/L Ranges for normal findings may vary among different laboratories and hospitals. You should always check with your doctor after having lab work or other tests done to discuss the meaning of your test results and whether your values are considered within normal limits. MEANING OF TEST  Your caregiver will go over the test results with you and discuss the importance and meaning of your results, as well as treatment options and the need for additional tests if necessary. OBTAINING THE TEST RESULTS It is your responsibility to obtain your test results. Ask the lab or department performing the test when and how you will get your results. Document Released: 02/28/2004 Document Revised: 04/30/2011 Document Reviewed: 01/14/2008 [facility] Patient Information 2014 [place], [place].

## 2012-12-12 NOTE — Progress Notes (Signed)
Consult Note: Gyn-Onc   [name] 57 y.o. female  Chief Complaint  Patient presents with  . Peritoneal Cancer    New Consult    Assessment : Advanced primary peritoneal carcinoma status post 3 cycles of carboplatin and Taxotere. Based on serial PET scans, it appears the patient has stable disease and has not demonstrated any significant response to the current regimen. Surprisingly she remains relatively asymptomatic.  Plan: I would make the following recommendations:  1. And  Avastin to each cycle of cytotoxic therapy.  2. monitor response by obtaining a CA 125 at the initiation of each cycle of chemotherapy.  3. Reevaluate after 3 additional cycles of carboplatin, Taxotere, and a Avastin. At that time, it might be reasonable to consider an attempt at surgical debulking versus switching to some other regimen which might include considerations of: Doxil, gemcitabine, topotecan, oral etoposide, weekly Taxol, etc.  4. I would like to see the patient back following 3 additional cycles of chemotherapy.    HPI: The patient is a 57 year old white married female who seeks second opinion regarding management of an apparent primary peritoneal carcinoma. The patient initially presented in July with increasing abdominal girth and the gastrointestinal symptoms. A CT scan revealed significant ascites omental cake and carcinomatosis. Paracentesis revealed adenocarcinoma consistent with an ovarian primary. It was elected that she be treated with neoadjuvant chemotherapy using carboplatin and Taxotere. She received her first cycle of chemotherapy on August 13. (On August 1 a CA 125 value was 333 units per mL). Patient received her second cycle on September 3, and the third cycle on October 1. On October 1 her CA 125 value was 428 units per mL) subsequently the patient's had a PET scan on October 21. A PET scan of October 21 shows no significant change from the PET scan obtained prior to initiating  chemotherapy.  The patient herself remains relatively asymptomatic although she does note abdominal distention. She sees be tolerating the chemotherapy well.  Patient has no other gynecologic history or symptoms although is noted there is a very strong family history of breast cancer including the patient herself developed breast cancer at age 55. Apparently no genetic testing has been obtained.  Review of Systems:10 point review of systems is negative except as noted in interval history.   Vitals: Blood pressure 127/71, pulse 80, temperature 98.1 F (36.7 C), temperature source Oral, resp. rate 16, height 5' 0.43" (1.535 m), weight 168 lb 4.8 oz (76.34 kg).  Physical Exam: General : The patient is a healthy woman in no acute distress.  HEENT: normocephalic, extraoccular movements normal; neck is supple without thyromegally  Lynphnodes: Supraclavicular and inguinal nodes not enlarged  Abdomen: Soft, non-tender, distended with shifting dullness and a fluid wave. Pelvic:  EGBUS: Normal female  Vagina: Normal, no lesions  Urethra and Bladder: Normal, non-tender  Cervix: Normal  Uterus: Difficult to outline secondary to massive ascites. Bi-manual examination: Non-tender; no adenxal masses or nodularity  Rectal: normal sphincter tone, no masses, no blood  Lower extremities: No edema or varicosities. Normal range of motion      No Known Allergies  Past Medical History  Diagnosis Date  . Premature menopause   . Osteopenia 03/2009    t score -2.1 FRAX 4.6/0.4  . Hypothyroidism   . Depression   . Anxiety   . Hyperlipidemia   . Bipolar disorder   . ADD (attention deficit disorder)   . Diabetes mellitus   . Breast cancer 1989  Past Surgical History  Procedure Laterality Date  . Breast surgery  1989    RIGHT LUMPECTOMY, RADIATION AND CHEMO  . Hysteroscopy  2011    Polyp  . Pelvic laparoscopy/ hysteroscopy  1996  . Foot surgery  2013    BILATERAL     Current Outpatient  Prescriptions  Medication Sig Dispense Refill  . ALPRAZolam (XANAX) 0.25 MG tablet Take 0.25 mg by mouth. 1-2 by mouth at bedtime as needed, Rx'ed by Dr.Taylor      . diphenoxylate-atropine (LOMOTIL) 2.5-0.025 MG per tablet Take 1 tablet by mouth 4 (four) times daily as needed for diarrhea or loose stools.  30 tablet  0  . divalproex (DEPAKOTE ER) 500 MG 24 hr tablet Take 1,000 mg by mouth at bedtime.      . gabapentin (NEURONTIN) 100 MG capsule Take 1 capsule (100 mg total) by mouth 3 (three) times daily.  90 capsule  0  . glycopyrrolate (ROBINUL) 2 MG tablet Take 2 mg by mouth 2 (two) times daily.      Marland Kitchen lamoTRIgine (LAMICTAL) 25 MG tablet Take 50 mg by mouth daily.      Marland Kitchen levothyroxine (SYNTHROID, LEVOTHROID) 100 MCG tablet Take 50-100 mcg by mouth daily before breakfast. Takes 1 tablet every day except Wednesday she takes 1/2 tablet      . lidocaine-prilocaine (EMLA) cream Apply topically as needed. Apply to port with every chemotherapy.  30 g  1  . ondansetron (ZOFRAN) 8 MG tablet Take 1 tablet (8 mg total) by mouth every 8 (eight) hours as needed for nausea.  20 tablet  0  . Probiotic Product (ALIGN PO) Take 1 tablet by mouth daily.       Marland Kitchen rOPINIRole (REQUIP) 1 MG tablet Take 1 mg by mouth at bedtime.      Marland Kitchen rOPINIRole (REQUIP) 1 MG tablet TAKE 1 TABLET BY MOUTH AT BEDTIME 3 HOURS BEFORE SLEEP  30 tablet  5   No current facility-administered medications for this visit.    History   Social History  . Marital Status: Married    Spouse Name: N/A    Number of Children: N/A  . Years of Education: N/A   Occupational History  . Not on file.   Social History Main Topics  . Smoking status: Never Smoker   . Smokeless tobacco: Never Used  . Alcohol Use: Yes     Comment: very rarely  . Drug Use: No  . Sexual Activity: No   Other Topics Concern  . Not on file   Social History Narrative  . No narrative on file    Family History  Problem Relation Age of Onset  . Breast cancer  Maternal Aunt 30  . Diabetes Maternal Grandmother   . Heart disease Maternal Grandmother   . Heart disease Maternal Grandfather   . Hypertension Paternal Grandfather   . Breast cancer Maternal Aunt 70  . Breast cancer Maternal Aunt 40  . Ovarian cancer Neg Hx       CLARKE-PEARSON,Julie Lacour L, MD 12/12/2012, 1:10 PM

## 2012-12-17 ENCOUNTER — Other Ambulatory Visit (HOSPITAL_BASED_OUTPATIENT_CLINIC_OR_DEPARTMENT_OTHER): Payer: BC Managed Care – PPO | Admitting: Lab

## 2012-12-17 ENCOUNTER — Ambulatory Visit (HOSPITAL_BASED_OUTPATIENT_CLINIC_OR_DEPARTMENT_OTHER): Payer: BC Managed Care – PPO

## 2012-12-17 ENCOUNTER — Other Ambulatory Visit: Payer: Self-pay | Admitting: Oncology

## 2012-12-17 VITALS — BP 103/54 | HR 78 | Temp 98.1°F | Resp 20

## 2012-12-17 DIAGNOSIS — C801 Malignant (primary) neoplasm, unspecified: Secondary | ICD-10-CM

## 2012-12-17 DIAGNOSIS — C50919 Malignant neoplasm of unspecified site of unspecified female breast: Secondary | ICD-10-CM

## 2012-12-17 DIAGNOSIS — C786 Secondary malignant neoplasm of retroperitoneum and peritoneum: Secondary | ICD-10-CM

## 2012-12-17 DIAGNOSIS — Z5111 Encounter for antineoplastic chemotherapy: Secondary | ICD-10-CM

## 2012-12-17 DIAGNOSIS — Z5112 Encounter for antineoplastic immunotherapy: Secondary | ICD-10-CM

## 2012-12-17 LAB — COMPREHENSIVE METABOLIC PANEL (CC13)
AST: 26 U/L (ref 5–34)
Albumin: 3.2 g/dL — ABNORMAL LOW (ref 3.5–5.0)
Alkaline Phosphatase: 68 U/L (ref 40–150)
BUN: 15.8 mg/dL (ref 7.0–26.0)
CO2: 23 mEq/L (ref 22–29)
Calcium: 9.6 mg/dL (ref 8.4–10.4)
Chloride: 107 mEq/L (ref 98–109)
Potassium: 4.9 mEq/L (ref 3.5–5.1)
Sodium: 137 mEq/L (ref 136–145)
Total Protein: 9.4 g/dL — ABNORMAL HIGH (ref 6.4–8.3)

## 2012-12-17 LAB — CBC WITH DIFFERENTIAL/PLATELET
Basophils Absolute: 0 10*3/uL (ref 0.0–0.1)
EOS%: 1.6 % (ref 0.0–7.0)
Eosinophils Absolute: 0.1 10*3/uL (ref 0.0–0.5)
HCT: 32.5 % — ABNORMAL LOW (ref 34.8–46.6)
HGB: 10 g/dL — ABNORMAL LOW (ref 11.6–15.9)
MCH: 28.5 pg (ref 25.1–34.0)
MONO%: 12.2 % (ref 0.0–14.0)
NEUT#: 2.6 10*3/uL (ref 1.5–6.5)
RBC: 3.51 10*6/uL — ABNORMAL LOW (ref 3.70–5.45)
RDW: 18.7 % — ABNORMAL HIGH (ref 11.2–14.5)
lymph#: 1.2 10*3/uL (ref 0.9–3.3)

## 2012-12-17 MED ORDER — ONDANSETRON 16 MG/50ML IVPB (CHCC)
INTRAVENOUS | Status: AC
  Filled 2012-12-17: qty 16

## 2012-12-17 MED ORDER — DEXAMETHASONE SODIUM PHOSPHATE 20 MG/5ML IJ SOLN
INTRAMUSCULAR | Status: AC
  Filled 2012-12-17: qty 5

## 2012-12-17 MED ORDER — SODIUM CHLORIDE 0.9 % IV SOLN
15.0000 mg/kg | Freq: Once | INTRAVENOUS | Status: AC
  Administered 2012-12-17: 1150 mg via INTRAVENOUS
  Filled 2012-12-17: qty 46

## 2012-12-17 MED ORDER — ONDANSETRON 16 MG/50ML IVPB (CHCC)
16.0000 mg | Freq: Once | INTRAVENOUS | Status: AC
  Administered 2012-12-17: 16 mg via INTRAVENOUS

## 2012-12-17 MED ORDER — HEPARIN SOD (PORK) LOCK FLUSH 100 UNIT/ML IV SOLN
500.0000 [IU] | Freq: Once | INTRAVENOUS | Status: AC | PRN
  Administered 2012-12-17: 500 [IU]
  Filled 2012-12-17: qty 5

## 2012-12-17 MED ORDER — DOCETAXEL CHEMO INJECTION 160 MG/16ML
60.0000 mg/m2 | Freq: Once | INTRAVENOUS | Status: AC
  Administered 2012-12-17: 110 mg via INTRAVENOUS
  Filled 2012-12-17: qty 11

## 2012-12-17 MED ORDER — SODIUM CHLORIDE 0.9 % IJ SOLN
10.0000 mL | INTRAMUSCULAR | Status: DC | PRN
  Administered 2012-12-17: 10 mL
  Filled 2012-12-17: qty 10

## 2012-12-17 MED ORDER — SODIUM CHLORIDE 0.9 % IV SOLN
Freq: Once | INTRAVENOUS | Status: AC
  Administered 2012-12-17: 13:00:00 via INTRAVENOUS

## 2012-12-17 MED ORDER — SODIUM CHLORIDE 0.9 % IV SOLN: 529.6000 mg | Freq: Once | INTRAVENOUS | Status: DC

## 2012-12-17 MED ORDER — SODIUM CHLORIDE 0.9 % IV SOLN
530.0000 mg | Freq: Once | INTRAVENOUS | Status: AC
  Administered 2012-12-17: 530 mg via INTRAVENOUS
  Filled 2012-12-17: qty 53

## 2012-12-17 MED ORDER — DEXAMETHASONE SODIUM PHOSPHATE 20 MG/5ML IJ SOLN
20.0000 mg | Freq: Once | INTRAMUSCULAR | Status: AC
  Administered 2012-12-17: 20 mg via INTRAVENOUS

## 2012-12-17 NOTE — Patient Instructions (Signed)
[facility] Discharge Instructions for Patients Receiving Chemotherapy  Today you received the following chemotherapy agents Avastin, Taxotere and Carboplatin.  To help prevent nausea and vomiting after your treatment, we encourage you to take your nausea medication.   If you develop nausea and vomiting that is not controlled by your nausea medication, call the clinic.   BELOW ARE SYMPTOMS THAT SHOULD BE REPORTED IMMEDIATELY:  *FEVER GREATER THAN 100.5 F  *CHILLS WITH OR WITHOUT FEVER  NAUSEA AND VOMITING THAT IS NOT CONTROLLED WITH YOUR NAUSEA MEDICATION  *UNUSUAL SHORTNESS OF BREATH  *UNUSUAL BRUISING OR BLEEDING  TENDERNESS IN MOUTH AND THROAT WITH OR WITHOUT PRESENCE OF ULCERS  *URINARY PROBLEMS  *BOWEL PROBLEMS  UNUSUAL RASH Items with * indicate a potential emergency and should be followed up as soon as possible.  Feel free to call the clinic you have any questions or concerns. The clinic phone number is [phone].

## 2012-12-18 ENCOUNTER — Ambulatory Visit (HOSPITAL_BASED_OUTPATIENT_CLINIC_OR_DEPARTMENT_OTHER): Payer: BC Managed Care – PPO

## 2012-12-18 VITALS — BP 124/61 | HR 62 | Temp 98.1°F

## 2012-12-18 DIAGNOSIS — C801 Malignant (primary) neoplasm, unspecified: Secondary | ICD-10-CM

## 2012-12-18 DIAGNOSIS — C786 Secondary malignant neoplasm of retroperitoneum and peritoneum: Secondary | ICD-10-CM

## 2012-12-18 DIAGNOSIS — Z5189 Encounter for other specified aftercare: Secondary | ICD-10-CM

## 2012-12-18 DIAGNOSIS — C50919 Malignant neoplasm of unspecified site of unspecified female breast: Secondary | ICD-10-CM

## 2012-12-18 MED ORDER — PEGFILGRASTIM INJECTION 6 MG/0.6ML
6.0000 mg | Freq: Once | SUBCUTANEOUS | Status: AC
  Administered 2012-12-18: 6 mg via SUBCUTANEOUS
  Filled 2012-12-18: qty 0.6

## 2012-12-25 ENCOUNTER — Other Ambulatory Visit: Payer: Self-pay

## 2013-01-06 ENCOUNTER — Telehealth: Payer: Self-pay | Admitting: *Deleted

## 2013-01-06 ENCOUNTER — Encounter: Payer: Self-pay | Admitting: Gynecology

## 2013-01-06 NOTE — Telephone Encounter (Signed)
Patient calling to ask if okay to visit her husband, who is inpatient with a bacterial infection. Okay per dr Clelia Croft. Patient notified.

## 2013-01-07 ENCOUNTER — Ambulatory Visit (HOSPITAL_BASED_OUTPATIENT_CLINIC_OR_DEPARTMENT_OTHER): Payer: BC Managed Care – PPO | Admitting: Oncology

## 2013-01-07 ENCOUNTER — Ambulatory Visit (HOSPITAL_BASED_OUTPATIENT_CLINIC_OR_DEPARTMENT_OTHER): Payer: BC Managed Care – PPO

## 2013-01-07 ENCOUNTER — Telehealth: Payer: Self-pay | Admitting: Oncology

## 2013-01-07 ENCOUNTER — Other Ambulatory Visit (HOSPITAL_BASED_OUTPATIENT_CLINIC_OR_DEPARTMENT_OTHER): Payer: BC Managed Care – PPO | Admitting: Lab

## 2013-01-07 VITALS — BP 118/71 | HR 59

## 2013-01-07 VITALS — BP 111/61 | HR 60 | Temp 96.8°F | Resp 19 | Ht 60.43 in | Wt 162.8 lb

## 2013-01-07 DIAGNOSIS — Z803 Family history of malignant neoplasm of breast: Secondary | ICD-10-CM

## 2013-01-07 DIAGNOSIS — C786 Secondary malignant neoplasm of retroperitoneum and peritoneum: Secondary | ICD-10-CM

## 2013-01-07 DIAGNOSIS — C801 Malignant (primary) neoplasm, unspecified: Secondary | ICD-10-CM

## 2013-01-07 DIAGNOSIS — Z5112 Encounter for antineoplastic immunotherapy: Secondary | ICD-10-CM

## 2013-01-07 DIAGNOSIS — C50919 Malignant neoplasm of unspecified site of unspecified female breast: Secondary | ICD-10-CM

## 2013-01-07 DIAGNOSIS — Z853 Personal history of malignant neoplasm of breast: Secondary | ICD-10-CM

## 2013-01-07 DIAGNOSIS — Z5111 Encounter for antineoplastic chemotherapy: Secondary | ICD-10-CM

## 2013-01-07 LAB — CBC WITH DIFFERENTIAL/PLATELET
BASO%: 0.7 % (ref 0.0–2.0)
Basophils Absolute: 0 10*3/uL (ref 0.0–0.1)
EOS%: 0 % (ref 0.0–7.0)
Eosinophils Absolute: 0 10*3/uL (ref 0.0–0.5)
HGB: 10.3 g/dL — ABNORMAL LOW (ref 11.6–15.9)
MCH: 29.4 pg (ref 25.1–34.0)
MCHC: 31.6 g/dL (ref 31.5–36.0)
NEUT#: 2.3 10*3/uL (ref 1.5–6.5)
RDW: 19 % — ABNORMAL HIGH (ref 11.2–14.5)
lymph#: 1.1 10*3/uL (ref 0.9–3.3)

## 2013-01-07 LAB — COMPREHENSIVE METABOLIC PANEL (CC13)
ALT: 10 U/L (ref 0–55)
AST: 22 U/L (ref 5–34)
Albumin: 3.5 g/dL (ref 3.5–5.0)
Anion Gap: 7 mEq/L (ref 3–11)
BUN: 12.5 mg/dL (ref 7.0–26.0)
Calcium: 9.4 mg/dL (ref 8.4–10.4)
Chloride: 106 mEq/L (ref 98–109)
Potassium: 4.9 mEq/L (ref 3.5–5.1)
Sodium: 139 mEq/L (ref 136–145)
Total Protein: 9.3 g/dL — ABNORMAL HIGH (ref 6.4–8.3)

## 2013-01-07 LAB — UA PROTEIN, DIPSTICK - CHCC: Protein, ur: NEGATIVE mg/dL

## 2013-01-07 MED ORDER — SODIUM CHLORIDE 0.9 % IV SOLN
476.0000 mg | Freq: Once | INTRAVENOUS | Status: AC
  Administered 2013-01-07: 480 mg via INTRAVENOUS
  Filled 2013-01-07: qty 48

## 2013-01-07 MED ORDER — HEPARIN SOD (PORK) LOCK FLUSH 100 UNIT/ML IV SOLN
500.0000 [IU] | Freq: Once | INTRAVENOUS | Status: AC | PRN
  Administered 2013-01-07: 500 [IU]
  Filled 2013-01-07: qty 5

## 2013-01-07 MED ORDER — DEXAMETHASONE SODIUM PHOSPHATE 20 MG/5ML IJ SOLN
20.0000 mg | Freq: Once | INTRAMUSCULAR | Status: AC
  Administered 2013-01-07: 20 mg via INTRAVENOUS

## 2013-01-07 MED ORDER — DOCETAXEL CHEMO INJECTION 160 MG/16ML
60.0000 mg/m2 | Freq: Once | INTRAVENOUS | Status: AC
  Administered 2013-01-07: 110 mg via INTRAVENOUS
  Filled 2013-01-07: qty 11

## 2013-01-07 MED ORDER — ONDANSETRON 16 MG/50ML IVPB (CHCC)
INTRAVENOUS | Status: AC
  Filled 2013-01-07: qty 16

## 2013-01-07 MED ORDER — SODIUM CHLORIDE 0.9 % IV SOLN
15.0000 mg/kg | Freq: Once | INTRAVENOUS | Status: AC
  Administered 2013-01-07: 1150 mg via INTRAVENOUS
  Filled 2013-01-07: qty 46

## 2013-01-07 MED ORDER — SODIUM CHLORIDE 0.9 % IV SOLN
Freq: Once | INTRAVENOUS | Status: AC
  Administered 2013-01-07: 11:00:00 via INTRAVENOUS

## 2013-01-07 MED ORDER — SODIUM CHLORIDE 0.9 % IJ SOLN
10.0000 mL | INTRAMUSCULAR | Status: DC | PRN
  Administered 2013-01-07: 10 mL
  Filled 2013-01-07: qty 10

## 2013-01-07 MED ORDER — DEXAMETHASONE SODIUM PHOSPHATE 20 MG/5ML IJ SOLN
INTRAMUSCULAR | Status: AC
  Filled 2013-01-07: qty 5

## 2013-01-07 MED ORDER — ONDANSETRON 16 MG/50ML IVPB (CHCC)
16.0000 mg | Freq: Once | INTRAVENOUS | Status: AC
  Administered 2013-01-07: 16 mg via INTRAVENOUS

## 2013-01-07 NOTE — Progress Notes (Signed)
Hematology and Oncology Follow Up Visit  [name] [id number] 1955-08-22 57 y.o. 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 This was diagnosed in July of 2014.   Prior Therapy:  She is status post paracentesis performed on 09/17/2012 with the cytology confirmed the presence of adenocarcinoma and immunohistochemical stains suggest GYN etiology. She is also status post lumpectomy for breast cancer diagnosed 25 years ago followed by radiation and chemotherapy under the care of Dr. Cleone Slim. She did not receive any hormonal therapy.  Current therapy: Chemotherapy utilizing carboplatin and Taxotere started on 10/01/2012. Avastin was added with cycle 4. She is here for cycle 5.  Interim History: [name] is a pleasant woman presented with peritoneal carcinomatosis and ascites and the diagnosis mentioned above. Since her last visit, she tolerated cycle 4 of chemotherapy with Avastin with few complications: She reports grade 1 diarrhea, grade 1 fatigue but no fevers or chills. She has reported also grade 1 neuropathy. She does report decrease in her abdominal fullness and some loose bowel habits. Her appetite is down but still able to perform most activities of daily living. She had not reported any fevers or chills or sweats. She has not any shortness of breath or chest pain. She did not report any genitourinary or GYN bleeding. She has not reported any recent complications or illnesses.  Medications: I have reviewed the patient's current medications.  Current Outpatient Prescriptions  Medication Sig Dispense Refill  . ALPRAZolam (XANAX) 0.25 MG tablet Take 0.25 mg by mouth. 1-2 by mouth at bedtime as needed, Rx'ed by Dr.Taylor      . diphenoxylate-atropine (LOMOTIL) 2.5-0.025 MG per tablet Take 1 tablet by mouth 4  (four) times daily as needed for diarrhea or loose stools.  30 tablet  0  . divalproex (DEPAKOTE ER) 500 MG 24 hr tablet Take 1,000 mg by mouth at bedtime.      . gabapentin (NEURONTIN) 100 MG capsule Take 1 capsule (100 mg total) by mouth 3 (three) times daily.  90 capsule  0  . glycopyrrolate (ROBINUL) 2 MG tablet Take 2 mg by mouth 2 (two) times daily.      Marland Kitchen lamoTRIgine (LAMICTAL) 25 MG tablet Take 50 mg by mouth daily.      Marland Kitchen levothyroxine (SYNTHROID, LEVOTHROID) 100 MCG tablet Take 50-100 mcg by mouth daily before breakfast. Takes 1 tablet every day except Wednesday she takes 1/2 tablet      . lidocaine-prilocaine (EMLA) cream Apply topically as needed. Apply to port with every chemotherapy.  30 g  1  . ondansetron (ZOFRAN) 8 MG tablet Take 1 tablet (8 mg total) by mouth every 8 (eight) hours as needed for nausea.  20 tablet  0  . Probiotic Product (ALIGN PO) Take 1 tablet by mouth daily.       Marland Kitchen rOPINIRole (REQUIP) 1 MG tablet Take 1 mg by mouth at bedtime.      Marland Kitchen rOPINIRole (REQUIP) 1 MG tablet TAKE 1 TABLET BY MOUTH AT BEDTIME 3 HOURS BEFORE SLEEP  30 tablet  5  . sertraline (ZOLOFT) 50 MG tablet Take 50 mg by mouth daily. Takes 1/2 for 3-4 days, states weaning off.       No current facility-administered medications for this visit.     Allergies: No Known Allergies  Past Medical History, Surgical history, Social history, and Family History were  reviewed and updated.  Review of Systems: Remaining ROS negative. Physical Exam: Blood pressure 111/61, pulse 60, temperature 96.8 F (36 C), temperature source Oral, resp. rate 19, height 5' 0.43" (1.535 m), weight 162 lb 12.8 oz (73.846 kg). ECOG: 1 General appearance: alert, cooperative and appears stated age Head: Normocephalic, without obvious abnormality, atraumatic Neck: no adenopathy, no carotid bruit, no JVD, supple, symmetrical, trachea midline and thyroid not enlarged, symmetric, no tenderness/mass/nodules Lymph nodes:  Cervical, supraclavicular, and axillary nodes normal. Heart:regular rate and rhythm, S1, S2 normal, no murmur, click, rub or gallop Lung:chest clear, no wheezing, rales, normal symmetric air entry Abdomin: soft, distended and shifting dullness noted.  it was not tender to deep palpation. EXT:no erythema, induration, or nodules   Lab Results: Lab Results  Component Value Date   WBC 4.1 01/07/2013   HGB 10.3* 01/07/2013   HCT 32.6* 01/07/2013   MCV 93.1 01/07/2013   PLT 95* 01/07/2013     Chemistry      Component Value Date/Time   NA 137 12/17/2012 1157   NA 136 07/03/2012 1359   K 4.9 12/17/2012 1157   K 4.1 07/03/2012 1359   CL 103 07/03/2012 1359   CO2 23 12/17/2012 1157   CO2 30 07/03/2012 1359   BUN 15.8 12/17/2012 1157   BUN 9 07/03/2012 1359   CREATININE 0.8 12/17/2012 1157   CREATININE 0.7 07/03/2012 1359      Component Value Date/Time   CALCIUM 9.6 12/17/2012 1157   CALCIUM 9.6 07/03/2012 1359   ALKPHOS 68 12/17/2012 1157   ALKPHOS 80 07/03/2012 1359   AST 26 12/17/2012 1157   AST 42* 07/03/2012 1359   ALT 13 12/17/2012 1157   ALT 30 07/03/2012 1359   BILITOT 0.21 12/17/2012 1157   BILITOT 0.5 07/03/2012 1359       Impression and Plan:  57 year old woman with the following issues:  1. Peritoneal carcinomatosis with omental involvement. The pathology confirmed the presence of adenocarcinoma of likely GYN etiology. Her CA 125 is elevated at 333.5. She tolerated the first 4 cycle of chemotherapy without any major complications. PET CT scan results after three cycles showed predominantly stable disease without really any dramatic improvements. We added a Avastin starting with the fourth cycle and with each cycle subsequently. She is ready to proceed with the fifth cycle today and we'll repeat imaging study after cycle 6.  2. Neutropenia prophylaxis she will receive Neulasta after each cycle.  3. IV access: She had a Port-A-Cath inserted without any complications and she  has Emla cream as well.   4. diarrhea: Says improved at this time  5. Nausea prophylaxis: She was given a prescription for Zofran.  6. Psychosocial support: Given her history of mental health disorder and given her options of counseling as well as social work services at this point she appeared to be compensated and reasonably in good mood.  7. Genetic counseling: She does have a rather strong maternal family history of early breast cancer as well as a personal history of breast cancer now she is developing possibly another cancer including omental carcinomatosis I think she would be of prime candidate for genetic counseling to identify a possible genetic disorder.  8. The followup: In 3 weeks for chemotherapy.    Ellia Knowlton, MD 11/19/20149:50 AM

## 2013-01-07 NOTE — Telephone Encounter (Signed)
gv and printed appt scehd and avs foro pt for NOV and DEC...sed added tx.

## 2013-01-07 NOTE — Progress Notes (Signed)
Per Dr Clelia Croft, okay to treat with plts 95.  SLJ

## 2013-01-07 NOTE — Patient Instructions (Signed)
[facility] Discharge Instructions for Patients Receiving Chemotherapy  Today you received the following chemotherapy agents avastin, taxotere, carboplatin  To help prevent nausea and vomiting after your treatment, we encourage you to take your nausea medication as needed   If you develop nausea and vomiting that is not controlled by your nausea medication, call the clinic.   BELOW ARE SYMPTOMS THAT SHOULD BE REPORTED IMMEDIATELY:  *FEVER GREATER THAN 100.5 F  *CHILLS WITH OR WITHOUT FEVER  NAUSEA AND VOMITING THAT IS NOT CONTROLLED WITH YOUR NAUSEA MEDICATION  *UNUSUAL SHORTNESS OF BREATH  *UNUSUAL BRUISING OR BLEEDING  TENDERNESS IN MOUTH AND THROAT WITH OR WITHOUT PRESENCE OF ULCERS  *URINARY PROBLEMS  *BOWEL PROBLEMS  UNUSUAL RASH Items with * indicate a potential emergency and should be followed up as soon as possible.  Feel free to call the clinic you have any questions or concerns. The clinic phone number is [phone].

## 2013-01-08 ENCOUNTER — Ambulatory Visit (HOSPITAL_BASED_OUTPATIENT_CLINIC_OR_DEPARTMENT_OTHER): Payer: BC Managed Care – PPO

## 2013-01-08 VITALS — BP 101/60 | HR 73 | Temp 97.1°F | Resp 20

## 2013-01-08 DIAGNOSIS — Z5189 Encounter for other specified aftercare: Secondary | ICD-10-CM

## 2013-01-08 DIAGNOSIS — C801 Malignant (primary) neoplasm, unspecified: Secondary | ICD-10-CM

## 2013-01-08 DIAGNOSIS — C786 Secondary malignant neoplasm of retroperitoneum and peritoneum: Secondary | ICD-10-CM

## 2013-01-08 DIAGNOSIS — C50919 Malignant neoplasm of unspecified site of unspecified female breast: Secondary | ICD-10-CM

## 2013-01-08 MED ORDER — PEGFILGRASTIM INJECTION 6 MG/0.6ML
6.0000 mg | Freq: Once | SUBCUTANEOUS | Status: AC
  Administered 2013-01-08: 6 mg via SUBCUTANEOUS
  Filled 2013-01-08: qty 0.6

## 2013-01-12 ENCOUNTER — Telehealth: Payer: Self-pay | Admitting: *Deleted

## 2013-01-12 NOTE — Telephone Encounter (Signed)
Pt is being seen by [facility] for peritoneal carcinomatosis, she has internal exam done at his office. Pt asked if you thought she should continue to see you as well? Please advise

## 2013-01-13 NOTE — Telephone Encounter (Signed)
I called the patient back having learned of her situation now. Was unaware of everything that she was going through. We discussed in general her treatment plan. I gave her my support and encouraged her to call me if she has any questions or confusion about any issues in the future dealing with different doctors and offices.  I do not feel that I would add anything with examination at this time and asked her to forego a routine GYN exam this year as she has been examined multiple times by other physicians. Her Pap smear from 2012 was normal.

## 2013-01-19 ENCOUNTER — Telehealth: Payer: Self-pay | Admitting: *Deleted

## 2013-01-19 NOTE — Telephone Encounter (Signed)
SANDY STATES AN ORDER WAS SENT TO THIS OFFICE FOR PT.'S THYROID STUDIES TO BE DRAWN AND THE RESULTS FAXED TO JENNIFER [name],PA'S OFFICE. THIS NOTE WAS LEFT ON DR.SHADAD'S NURSE'S DESK.

## 2013-01-20 ENCOUNTER — Telehealth: Payer: Self-pay | Admitting: *Deleted

## 2013-01-20 NOTE — Telephone Encounter (Signed)
Sandy with [employer] calling for something for patient's sciatica pain radiating down left leg. Per dr Clelia Croft, anti-inflamatory ok. Sandy verbalizes understanding.

## 2013-01-20 NOTE — Telephone Encounter (Signed)
Sandy from dr willard's office calling to ask about labs that were not drawn for thyroid panel. Gave her direct fax number for the lab, to sent duplicate request. Also faxed last 4 o.v. Notes to her fax [phone]

## 2013-01-26 ENCOUNTER — Encounter: Payer: Self-pay | Admitting: Oncology

## 2013-01-27 ENCOUNTER — Telehealth: Payer: Self-pay | Admitting: *Deleted

## 2013-01-27 NOTE — Telephone Encounter (Signed)
Left voice mail for patient to call office and ask for triage nurse to discuss her dental issue as noted on My Chart message this morning.

## 2013-01-28 ENCOUNTER — Other Ambulatory Visit: Payer: Self-pay | Admitting: *Deleted

## 2013-01-28 ENCOUNTER — Telehealth: Payer: Self-pay | Admitting: Oncology

## 2013-01-28 ENCOUNTER — Ambulatory Visit (HOSPITAL_BASED_OUTPATIENT_CLINIC_OR_DEPARTMENT_OTHER): Payer: BC Managed Care – PPO | Admitting: Oncology

## 2013-01-28 ENCOUNTER — Other Ambulatory Visit (HOSPITAL_BASED_OUTPATIENT_CLINIC_OR_DEPARTMENT_OTHER): Payer: BC Managed Care – PPO

## 2013-01-28 ENCOUNTER — Ambulatory Visit: Payer: BC Managed Care – PPO

## 2013-01-28 VITALS — BP 125/52 | HR 61 | Temp 97.5°F | Resp 18 | Ht 60.43 in | Wt 157.3 lb

## 2013-01-28 DIAGNOSIS — C786 Secondary malignant neoplasm of retroperitoneum and peritoneum: Secondary | ICD-10-CM

## 2013-01-28 DIAGNOSIS — C801 Malignant (primary) neoplasm, unspecified: Secondary | ICD-10-CM

## 2013-01-28 DIAGNOSIS — Z803 Family history of malignant neoplasm of breast: Secondary | ICD-10-CM

## 2013-01-28 DIAGNOSIS — Z853 Personal history of malignant neoplasm of breast: Secondary | ICD-10-CM

## 2013-01-28 LAB — CBC WITH DIFFERENTIAL/PLATELET
BASO%: 0.3 % (ref 0.0–2.0)
Basophils Absolute: 0 10*3/uL (ref 0.0–0.1)
Eosinophils Absolute: 0 10*3/uL (ref 0.0–0.5)
HCT: 31.7 % — ABNORMAL LOW (ref 34.8–46.6)
HGB: 9.7 g/dL — ABNORMAL LOW (ref 11.6–15.9)
LYMPH%: 36.3 % (ref 14.0–49.7)
MCHC: 30.6 g/dL — ABNORMAL LOW (ref 31.5–36.0)
MONO#: 0.5 10*3/uL (ref 0.1–0.9)
NEUT#: 1.8 10*3/uL (ref 1.5–6.5)
Platelets: 71 10*3/uL — ABNORMAL LOW (ref 145–400)
RBC: 3.24 10*6/uL — ABNORMAL LOW (ref 3.70–5.45)
WBC: 3.7 10*3/uL — ABNORMAL LOW (ref 3.9–10.3)
lymph#: 1.4 10*3/uL (ref 0.9–3.3)
nRBC: 0 % (ref 0–0)

## 2013-01-28 LAB — COMPREHENSIVE METABOLIC PANEL (CC13)
ALT: 22 U/L (ref 0–55)
AST: 39 U/L — ABNORMAL HIGH (ref 5–34)
Albumin: 3.4 g/dL — ABNORMAL LOW (ref 3.5–5.0)
Anion Gap: 10 mEq/L (ref 3–11)
CO2: 22 mEq/L (ref 22–29)
Chloride: 108 mEq/L (ref 98–109)
Glucose: 80 mg/dl (ref 70–140)
Potassium: 4.9 mEq/L (ref 3.5–5.1)
Sodium: 139 mEq/L (ref 136–145)
Total Bilirubin: 0.24 mg/dL (ref 0.20–1.20)
Total Protein: 8.4 g/dL — ABNORMAL HIGH (ref 6.4–8.3)

## 2013-01-28 NOTE — Telephone Encounter (Signed)
gv and printed appt sched and avs for pt for DEc and J;an 2015 ....sed added tx.  °

## 2013-01-28 NOTE — Progress Notes (Signed)
Hematology and Oncology Follow Up Visit  [name] [id number] 1955/05/24 57 y.o. 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 This was diagnosed in July of 2014.   Prior Therapy:  She is status post paracentesis performed on 09/17/2012 with the cytology confirmed the presence of adenocarcinoma and immunohistochemical stains suggest GYN etiology. She is also status post lumpectomy for breast cancer diagnosed 25 years ago followed by radiation and chemotherapy under the care of Dr. Cleone Slim. She did not receive any hormonal therapy.  Current therapy: Chemotherapy utilizing carboplatin and Taxotere started on 10/01/2012. Avastin was added with cycle 4. She is here for cycle 6.  Interim History: [name] is a pleasant woman presented with peritoneal carcinomatosis and ascites and the diagnosis mentioned above. Since her last visit, she tolerated cycle 5 of chemotherapy with Avastin with few complications: She reports grade 1 diarrhea, grade 1 fatigue but no fevers or chills. She has reported also grade 1 neuropathy. She does report decrease in her abdominal fullness and some loose bowel habits. Her appetite is down but still able to perform most activities of daily living. She had not reported any fevers or chills or sweats. She has not any shortness of breath or chest pain. She did not report any genitourinary or GYN bleeding. She has not reported any recent complications or illnesses. She has not reported any bleeding or infections. She is reporting some pain in her teeth and she is scheduled to see a dentist in the near future.  Medications: I have reviewed the patient's current medications.  Current Outpatient Prescriptions  Medication Sig Dispense Refill  . ALPRAZolam (XANAX) 0.25 MG tablet Take 0.25 mg by  mouth. 1-2 by mouth at bedtime as needed, Rx'ed by Dr.Taylor      . diphenoxylate-atropine (LOMOTIL) 2.5-0.025 MG per tablet Take 1 tablet by mouth 4 (four) times daily as needed for diarrhea or loose stools.  30 tablet  0  . divalproex (DEPAKOTE ER) 500 MG 24 hr tablet Take 1,000 mg by mouth at bedtime.      . gabapentin (NEURONTIN) 100 MG capsule Take 1 capsule (100 mg total) by mouth 3 (three) times daily.  90 capsule  0  . glycopyrrolate (ROBINUL) 2 MG tablet Take 2 mg by mouth 2 (two) times daily.      Marland Kitchen lamoTRIgine (LAMICTAL) 25 MG tablet Take 50 mg by mouth daily.      Marland Kitchen levothyroxine (SYNTHROID, LEVOTHROID) 100 MCG tablet Take 50-100 mcg by mouth daily before breakfast. Takes 1 tablet every day except Wednesday she takes 1/2 tablet      . lidocaine-prilocaine (EMLA) cream Apply topically as needed. Apply to port with every chemotherapy.  30 g  1  . ondansetron (ZOFRAN) 8 MG tablet Take 1 tablet (8 mg total) by mouth every 8 (eight) hours as needed for nausea.  20 tablet  0  . Probiotic Product (ALIGN PO) Take 1 tablet by mouth daily.       Marland Kitchen rOPINIRole (REQUIP) 1 MG tablet Take 1 mg by mouth at bedtime.      Marland Kitchen rOPINIRole (REQUIP) 1 MG tablet TAKE 1 TABLET BY MOUTH AT BEDTIME 3 HOURS BEFORE SLEEP  30 tablet  5  . sertraline (ZOLOFT) 50 MG tablet Take 50 mg by mouth daily. Takes 1/2 for 3-4 days, states weaning off.  No current facility-administered medications for this visit.     Allergies: No Known Allergies  Past Medical History, Surgical history, Social history, and Family History were reviewed and updated.  Review of Systems: Remaining ROS negative. Physical Exam: Blood pressure 125/52, pulse 61, temperature 97.5 F (36.4 C), temperature source Oral, resp. rate 18, height 5' 0.43" (1.535 m), weight 157 lb 4.8 oz (71.351 kg). ECOG: 1 General appearance: alert, cooperative and appears stated age Head: Normocephalic, without obvious abnormality, atraumatic Neck: no  adenopathy, no carotid bruit, no JVD, supple, symmetrical, trachea midline and thyroid not enlarged, symmetric, no tenderness/mass/nodules Lymph nodes: Cervical, supraclavicular, and axillary nodes normal. Heart:regular rate and rhythm, S1, S2 normal, no murmur, click, rub or gallop Lung:chest clear, no wheezing, rales, normal symmetric air entry Abdomin: soft, distended and shifting dullness noted.  it was not tender to deep palpation. EXT:no erythema, induration, or nodules   Lab Results: Lab Results  Component Value Date   WBC 3.7* 01/28/2013   HGB 9.7* 01/28/2013   HCT 31.7* 01/28/2013   MCV 97.8 01/28/2013   PLT 71* 01/28/2013     Chemistry      Component Value Date/Time   NA 139 01/07/2013 0918   NA 136 07/03/2012 1359   K 4.9 01/07/2013 0918   K 4.1 07/03/2012 1359   CL 103 07/03/2012 1359   CO2 26 01/07/2013 0918   CO2 30 07/03/2012 1359   BUN 12.5 01/07/2013 0918   BUN 9 07/03/2012 1359   CREATININE 0.7 01/07/2013 0918   CREATININE 0.7 07/03/2012 1359      Component Value Date/Time   CALCIUM 9.4 01/07/2013 0918   CALCIUM 9.6 07/03/2012 1359   ALKPHOS 68 01/07/2013 0918   ALKPHOS 80 07/03/2012 1359   AST 22 01/07/2013 0918   AST 42* 07/03/2012 1359   ALT 10 01/07/2013 0918   ALT 30 07/03/2012 1359   BILITOT 0.35 01/07/2013 0918   BILITOT 0.5 07/03/2012 1359       Impression and Plan:  57 year old woman with the following issues:  1. Peritoneal carcinomatosis with omental involvement. The pathology confirmed the presence of adenocarcinoma of likely GYN etiology. Her CA 125 is elevated at 333.5. She tolerated the first 5 cycle of chemotherapy without any major complications. PET CT scan results after three cycles showed predominantly stable disease without really any dramatic improvements. We added a Avastin starting with the fourth cycle and with each cycle subsequently.  We will delay cycle 6 for one week due to thrombocytopenia. She will also have a PET CT scan in  January of 2015.  2. Neutropenia prophylaxis she will receive Neulasta after each cycle.  3. IV access: She had a Port-A-Cath inserted without any complications and she has Emla cream as well.   4. diarrhea: Says improved at this time  5. Nausea prophylaxis: She was given a prescription for Zofran.  6. Psychosocial support: Given her history of mental health disorder and given her options of counseling as well as social work services at this point she appeared to be compensated and reasonably in good mood.  7. Genetic counseling: She does have a rather strong maternal family history of early breast cancer as well as a personal history of breast cancer now she is developing possibly another cancer including omental carcinomatosis I think she would be of prime candidate for genetic counseling to identify a possible genetic disorder.  8. The followup: In one week for cycle 6 of chemotherapy and in 4 weeks for a  followup after her PET/CT scan.    SHADAD,FIRAS, MD 12/10/201410:31 AM

## 2013-01-29 ENCOUNTER — Ambulatory Visit: Payer: BC Managed Care – PPO

## 2013-01-29 LAB — CA 125: CA 125: 169.4 U/mL — ABNORMAL HIGH (ref 0.0–30.2)

## 2013-02-03 ENCOUNTER — Telehealth: Payer: Self-pay | Admitting: *Deleted

## 2013-02-03 NOTE — Telephone Encounter (Signed)
Patient calling to ask if okay to get a root canal? Okay per dr Clelia Croft. Patient notified.

## 2013-02-04 ENCOUNTER — Other Ambulatory Visit (HOSPITAL_BASED_OUTPATIENT_CLINIC_OR_DEPARTMENT_OTHER): Payer: BC Managed Care – PPO

## 2013-02-04 ENCOUNTER — Ambulatory Visit (HOSPITAL_BASED_OUTPATIENT_CLINIC_OR_DEPARTMENT_OTHER): Payer: BC Managed Care – PPO

## 2013-02-04 ENCOUNTER — Other Ambulatory Visit: Payer: BC Managed Care – PPO

## 2013-02-04 VITALS — BP 133/76 | HR 70 | Temp 97.1°F | Resp 19 | Ht 60.0 in

## 2013-02-04 DIAGNOSIS — C786 Secondary malignant neoplasm of retroperitoneum and peritoneum: Secondary | ICD-10-CM

## 2013-02-04 DIAGNOSIS — Z5111 Encounter for antineoplastic chemotherapy: Secondary | ICD-10-CM

## 2013-02-04 DIAGNOSIS — C801 Malignant (primary) neoplasm, unspecified: Secondary | ICD-10-CM

## 2013-02-04 DIAGNOSIS — C50919 Malignant neoplasm of unspecified site of unspecified female breast: Secondary | ICD-10-CM

## 2013-02-04 LAB — COMPREHENSIVE METABOLIC PANEL (CC13)
AST: 27 U/L (ref 5–34)
Albumin: 3.4 g/dL — ABNORMAL LOW (ref 3.5–5.0)
Anion Gap: 7 mEq/L (ref 3–11)
BUN: 19.8 mg/dL (ref 7.0–26.0)
CO2: 26 mEq/L (ref 22–29)
Calcium: 9.3 mg/dL (ref 8.4–10.4)
Chloride: 108 mEq/L (ref 98–109)
Glucose: 95 mg/dl (ref 70–140)
Potassium: 4.2 mEq/L (ref 3.5–5.1)
Sodium: 140 mEq/L (ref 136–145)
Total Protein: 8.8 g/dL — ABNORMAL HIGH (ref 6.4–8.3)

## 2013-02-04 LAB — CBC WITH DIFFERENTIAL/PLATELET
BASO%: 0.2 % (ref 0.0–2.0)
Basophils Absolute: 0 10*3/uL (ref 0.0–0.1)
EOS%: 0.4 % (ref 0.0–7.0)
HGB: 10.8 g/dL — ABNORMAL LOW (ref 11.6–15.9)
LYMPH%: 24 % (ref 14.0–49.7)
MCH: 30.3 pg (ref 25.1–34.0)
MCHC: 31.3 g/dL — ABNORMAL LOW (ref 31.5–36.0)
MCV: 96.9 fL (ref 79.5–101.0)
MONO%: 10.2 % (ref 0.0–14.0)
NEUT%: 65.2 % (ref 38.4–76.8)
Platelets: 87 10*3/uL — ABNORMAL LOW (ref 145–400)
RDW: 15.3 % — ABNORMAL HIGH (ref 11.2–14.5)
lymph#: 1.2 10*3/uL (ref 0.9–3.3)

## 2013-02-04 MED ORDER — DEXAMETHASONE SODIUM PHOSPHATE 20 MG/5ML IJ SOLN
INTRAMUSCULAR | Status: AC
  Filled 2013-02-04: qty 5

## 2013-02-04 MED ORDER — ONDANSETRON 16 MG/50ML IVPB (CHCC)
INTRAVENOUS | Status: AC
  Filled 2013-02-04: qty 16

## 2013-02-04 MED ORDER — DOCETAXEL CHEMO INJECTION 160 MG/16ML
60.0000 mg/m2 | Freq: Once | INTRAVENOUS | Status: DC
  Filled 2013-02-04: qty 11

## 2013-02-04 MED ORDER — SODIUM CHLORIDE 0.9 % IV SOLN
110.0000 mg | Freq: Once | INTRAVENOUS | Status: AC
  Administered 2013-02-04: 110 mg via INTRAVENOUS
  Filled 2013-02-04: qty 11

## 2013-02-04 MED ORDER — SODIUM CHLORIDE 0.9 % IV SOLN
530.0000 mg | Freq: Once | INTRAVENOUS | Status: AC
  Administered 2013-02-04: 530 mg via INTRAVENOUS
  Filled 2013-02-04: qty 53

## 2013-02-04 MED ORDER — SODIUM CHLORIDE 0.9 % IJ SOLN
10.0000 mL | INTRAMUSCULAR | Status: DC | PRN
  Administered 2013-02-04: 10 mL
  Filled 2013-02-04: qty 10

## 2013-02-04 MED ORDER — DEXAMETHASONE SODIUM PHOSPHATE 20 MG/5ML IJ SOLN
20.0000 mg | Freq: Once | INTRAMUSCULAR | Status: AC
  Administered 2013-02-04: 20 mg via INTRAVENOUS

## 2013-02-04 MED ORDER — SODIUM CHLORIDE 0.9 % IV SOLN
Freq: Once | INTRAVENOUS | Status: AC
  Administered 2013-02-04: 13:00:00 via INTRAVENOUS

## 2013-02-04 MED ORDER — ONDANSETRON 16 MG/50ML IVPB (CHCC)
16.0000 mg | Freq: Once | INTRAVENOUS | Status: AC
  Administered 2013-02-04: 16 mg via INTRAVENOUS

## 2013-02-04 MED ORDER — HEPARIN SOD (PORK) LOCK FLUSH 100 UNIT/ML IV SOLN
500.0000 [IU] | Freq: Once | INTRAVENOUS | Status: AC | PRN
  Administered 2013-02-04: 500 [IU]
  Filled 2013-02-04: qty 5

## 2013-02-04 NOTE — Progress Notes (Signed)
Platelets @ 87, okay to treat, per Dr. Clelia Croft.  Hold Avastin today; patient scheduled for dental treatment.

## 2013-02-05 ENCOUNTER — Ambulatory Visit (HOSPITAL_BASED_OUTPATIENT_CLINIC_OR_DEPARTMENT_OTHER): Payer: BC Managed Care – PPO

## 2013-02-05 VITALS — BP 128/62 | HR 64 | Temp 98.1°F

## 2013-02-05 DIAGNOSIS — C786 Secondary malignant neoplasm of retroperitoneum and peritoneum: Secondary | ICD-10-CM

## 2013-02-05 DIAGNOSIS — C50919 Malignant neoplasm of unspecified site of unspecified female breast: Secondary | ICD-10-CM

## 2013-02-05 DIAGNOSIS — C801 Malignant (primary) neoplasm, unspecified: Secondary | ICD-10-CM

## 2013-02-05 DIAGNOSIS — Z5189 Encounter for other specified aftercare: Secondary | ICD-10-CM

## 2013-02-05 MED ORDER — PEGFILGRASTIM INJECTION 6 MG/0.6ML
6.0000 mg | Freq: Once | SUBCUTANEOUS | Status: AC
  Administered 2013-02-05: 6 mg via SUBCUTANEOUS
  Filled 2013-02-05: qty 0.6

## 2013-02-08 ENCOUNTER — Other Ambulatory Visit: Payer: Self-pay | Admitting: Oncology

## 2013-02-25 ENCOUNTER — Ambulatory Visit (HOSPITAL_COMMUNITY)
Admission: RE | Admit: 2013-02-25 | Discharge: 2013-02-25 | Disposition: A | Discharge status: Home / Self Care | Payer: BC Managed Care – PPO | Source: Ambulatory Visit | Attending: Oncology | Admitting: Oncology

## 2013-02-25 ENCOUNTER — Other Ambulatory Visit (HOSPITAL_BASED_OUTPATIENT_CLINIC_OR_DEPARTMENT_OTHER): Payer: BC Managed Care – PPO

## 2013-02-25 DIAGNOSIS — C786 Secondary malignant neoplasm of retroperitoneum and peritoneum: Secondary | ICD-10-CM

## 2013-02-25 DIAGNOSIS — Z853 Personal history of malignant neoplasm of breast: Secondary | ICD-10-CM | POA: Insufficient documentation

## 2013-02-25 DIAGNOSIS — C801 Malignant (primary) neoplasm, unspecified: Secondary | ICD-10-CM | POA: Insufficient documentation

## 2013-02-25 DIAGNOSIS — R188 Other ascites: Secondary | ICD-10-CM | POA: Insufficient documentation

## 2013-02-25 DIAGNOSIS — J90 Pleural effusion, not elsewhere classified: Secondary | ICD-10-CM | POA: Insufficient documentation

## 2013-02-25 LAB — COMPREHENSIVE METABOLIC PANEL (CC13)
ALT: 8 U/L (ref 0–55)
AST: 18 U/L (ref 5–34)
Albumin: 3.3 g/dL — ABNORMAL LOW (ref 3.5–5.0)
Alkaline Phosphatase: 80 U/L (ref 40–150)
Anion Gap: 7 mEq/L (ref 3–11)
BUN: 14.7 mg/dL (ref 7.0–26.0)
CHLORIDE: 106 meq/L (ref 98–109)
CO2: 27 mEq/L (ref 22–29)
CREATININE: 0.7 mg/dL (ref 0.6–1.1)
Calcium: 9.3 mg/dL (ref 8.4–10.4)
Glucose: 84 mg/dl (ref 70–140)
POTASSIUM: 4.5 meq/L (ref 3.5–5.1)
Sodium: 140 mEq/L (ref 136–145)
Total Bilirubin: 0.25 mg/dL (ref 0.20–1.20)
Total Protein: 8.4 g/dL — ABNORMAL HIGH (ref 6.4–8.3)

## 2013-02-25 LAB — CBC WITH DIFFERENTIAL/PLATELET
BASO%: 0.6 % (ref 0.0–2.0)
Basophils Absolute: 0 10*3/uL (ref 0.0–0.1)
EOS%: 0.3 % (ref 0.0–7.0)
Eosinophils Absolute: 0 10*3/uL (ref 0.0–0.5)
HEMATOCRIT: 30.9 % — AB (ref 34.8–46.6)
HGB: 9.9 g/dL — ABNORMAL LOW (ref 11.6–15.9)
LYMPH#: 1.4 10*3/uL (ref 0.9–3.3)
LYMPH%: 35.3 % (ref 14.0–49.7)
MCH: 30.6 pg (ref 25.1–34.0)
MCHC: 32.2 g/dL (ref 31.5–36.0)
MCV: 95 fL (ref 79.5–101.0)
MONO#: 0.5 10*3/uL (ref 0.1–0.9)
MONO%: 12.9 % (ref 0.0–14.0)
NEUT%: 50.9 % (ref 38.4–76.8)
NEUTROS ABS: 2 10*3/uL (ref 1.5–6.5)
Platelets: 94 10*3/uL — ABNORMAL LOW (ref 145–400)
RBC: 3.25 10*6/uL — ABNORMAL LOW (ref 3.70–5.45)
RDW: 15 % — ABNORMAL HIGH (ref 11.2–14.5)
WBC: 4 10*3/uL (ref 3.9–10.3)

## 2013-02-25 LAB — GLUCOSE, CAPILLARY: Glucose-Capillary: 86 mg/dL (ref 70–99)

## 2013-02-25 MED ORDER — FLUDEOXYGLUCOSE F - 18 (FDG) INJECTION: 17.0000 | Freq: Once | INTRAVENOUS | Status: AC | PRN

## 2013-02-26 ENCOUNTER — Telehealth: Payer: Self-pay | Admitting: Oncology

## 2013-02-26 ENCOUNTER — Telehealth: Payer: Self-pay | Admitting: *Deleted

## 2013-02-26 ENCOUNTER — Ambulatory Visit (HOSPITAL_BASED_OUTPATIENT_CLINIC_OR_DEPARTMENT_OTHER): Payer: BC Managed Care – PPO | Admitting: Oncology

## 2013-02-26 VITALS — BP 114/75 | HR 68 | Temp 97.8°F | Resp 18 | Ht 60.0 in | Wt 156.3 lb

## 2013-02-26 DIAGNOSIS — C786 Secondary malignant neoplasm of retroperitoneum and peritoneum: Secondary | ICD-10-CM

## 2013-02-26 DIAGNOSIS — C801 Malignant (primary) neoplasm, unspecified: Secondary | ICD-10-CM

## 2013-02-26 LAB — CA 125: CA 125: 140.3 U/mL — ABNORMAL HIGH (ref 0.0–30.2)

## 2013-02-26 MED ORDER — CLOTRIMAZOLE-BETAMETHASONE 1-0.05 % EX CREA: 1.0000 "application " | TOPICAL_CREAM | Freq: Two times a day (BID) | CUTANEOUS | Status: DC

## 2013-02-26 NOTE — Telephone Encounter (Signed)
Per staff message and POF I have scheduled appts.  JMW  

## 2013-02-26 NOTE — Telephone Encounter (Signed)
Gave pt appt for lab, md and chemo for January and february 2015 °

## 2013-02-26 NOTE — Progress Notes (Signed)
Hematology and Oncology Follow Up Visit  [name] [id number] [birth year] 58 y.o. 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 This was diagnosed in July of 2014.   Prior Therapy:  She is status post paracentesis performed on 09/17/2012 with the cytology confirmed the presence of adenocarcinoma and immunohistochemical stains suggest GYN etiology. She is also status post lumpectomy for breast cancer diagnosed 25 years ago followed by radiation and chemotherapy under the care of Dr. Sonny Dandy. She did not receive any hormonal therapy.  Current therapy: Chemotherapy utilizing carboplatin and Taxotere started on 10/01/2012. Avastin was added with cycle 4. She is here for cycle 7.  Interim History: [name] is a pleasant woman presented with peritoneal carcinomatosis and ascites and the diagnosis mentioned above. Since her last visit, she tolerated cycle 6 of chemotherapy with Avastin with few complications: She reports grade 1 diarrhea, grade 1 fatigue but no fevers or chills. She has reported also grade 1 neuropathy. She does report decrease in her abdominal fullness and some loose bowel habits. Her appetite is down but still able to perform most activities of daily living. She had not reported any fevers or chills or sweats. She has not any shortness of breath or chest pain. She did not report any genitourinary or GYN bleeding. She has not reported any recent complications or illnesses. She has not reported any bleeding or infections. She is reporting some pain in her teeth and she is scheduled to see a dentist in the near future. She is reporting less abdominal fullness and satiety at this time.  Medications: I have reviewed the patient's current medications.  Current Outpatient Prescriptions  Medication Sig Dispense  Refill  . ALPRAZolam (XANAX) 0.25 MG tablet Take 0.25 mg by mouth. 1-2 by mouth at bedtime as needed, Rx'ed by Dr.Taylor      . diphenoxylate-atropine (LOMOTIL) 2.5-0.025 MG per tablet Take 1 tablet by mouth 4 (four) times daily as needed for diarrhea or loose stools.  30 tablet  0  . divalproex (DEPAKOTE ER) 500 MG 24 hr tablet Take 1,000 mg by mouth at bedtime.      . gabapentin (NEURONTIN) 100 MG capsule TAKE 1 CAPSULE BY MOUTH 3 TIMES DAILY.  90 capsule  0  . lamoTRIgine (LAMICTAL) 150 MG tablet Take 150 mg by mouth daily.      Marland Kitchen levothyroxine (SYNTHROID, LEVOTHROID) 100 MCG tablet Take 50-100 mcg by mouth daily before breakfast. Takes 1 tablet every day except Wednesday she takes 1/2 tablet      . lidocaine-prilocaine (EMLA) cream Apply topically as needed. Apply to port with every chemotherapy.  30 g  1  . ondansetron (ZOFRAN) 8 MG tablet Take 1 tablet (8 mg total) by mouth every 8 (eight) hours as needed for nausea.  20 tablet  0  . Probiotic Product (ALIGN PO) Take 1 tablet by mouth daily.       Marland Kitchen rOPINIRole (REQUIP) 1 MG tablet TAKE 1 TABLET BY MOUTH AT BEDTIME 3 HOURS BEFORE SLEEP  30 tablet  5  . sodium chloride (OCEAN) 0.65 % SOLN nasal spray Place 1 spray into both nostrils as needed for congestion.      . clotrimazole-betamethasone (LOTRISONE) cream Apply 1 application topically 2 (two) times daily.  30 g  0   No current facility-administered medications for this visit.     Allergies: No  Known Allergies  Past Medical History, Surgical history, Social history, and Family History were reviewed and updated.  Review of Systems: Remaining ROS negative. Physical Exam: Blood pressure 114/75, pulse 68, temperature 97.8 F (36.6 C), temperature source Oral, resp. rate 18, height 5' (1.524 m), weight 156 lb 4.8 oz (70.897 kg), SpO2 100.00%. ECOG: 1 General appearance: alert, cooperative and appears stated age Head: Normocephalic, without obvious abnormality, atraumatic Neck: no  adenopathy, no carotid bruit, no JVD, supple, symmetrical, trachea midline and thyroid not enlarged, symmetric, no tenderness/mass/nodules Lymph nodes: Cervical, supraclavicular, and axillary nodes normal. Heart:regular rate and rhythm, S1, S2 normal, no murmur, click, rub or gallop Lung:chest clear, no wheezing, rales, normal symmetric air entry Abdomin: soft, distended and shifting dullness noted.  it was not tender to deep palpation. EXT:no erythema, induration, or nodules   Lab Results: Lab Results  Component Value Date   WBC 4.0 02/25/2013   HGB 9.9* 02/25/2013   HCT 30.9* 02/25/2013   MCV 95.0 02/25/2013   PLT 94* 02/25/2013     Chemistry      Component Value Date/Time   NA 140 02/25/2013 1140   NA 136 07/03/2012 1359   K 4.5 02/25/2013 1140   K 4.1 07/03/2012 1359   CL 103 07/03/2012 1359   CO2 27 02/25/2013 1140   CO2 30 07/03/2012 1359   BUN 14.7 02/25/2013 1140   BUN 9 07/03/2012 1359   CREATININE 0.7 02/25/2013 1140   CREATININE 0.7 07/03/2012 1359      Component Value Date/Time   CALCIUM 9.3 02/25/2013 1140   CALCIUM 9.6 07/03/2012 1359   ALKPHOS 80 02/25/2013 1140   ALKPHOS 80 07/03/2012 1359   AST 18 02/25/2013 1140   AST 42* 07/03/2012 1359   ALT 8 02/25/2013 1140   ALT 30 07/03/2012 1359   BILITOT 0.25 02/25/2013 1140   BILITOT 0.5 07/03/2012 1359      :  NUCLEAR MEDICINE PET SKULL BASE TO THIGH  FASTING BLOOD GLUCOSE: Value: 86 mg/dl  TECHNIQUE:  17.0 mCi F-18 FDG was injected intravenously. CT data was obtained  and used for attenuation correction and anatomic localization only.  (This was not acquired as a diagnostic CT examination.) Additional  exam technical data entered on technologist worksheet.  COMPARISON: 12/09/2012  FINDINGS:  NECK  No hypermetabolic lymph nodes in the neck.  CHEST  No suspicious pulmonary nodules on the CT scan. Trace left pleural  effusion, decreased.  Right chest port.  Status post right breast lumpectomy and right axillary lymph node  dissection.   No hypermetabolic mediastinal or hilar nodes.  ABDOMEN/PELVIS  No abnormal hypermetabolic activity within the liver, pancreas,  adrenal glands, or spleen.  Small volume abdominopelvic ascites, decreased.  6.5 x 3.6 cm peritoneal soft tissue lesion along the inferior margin  of the right liver (series 2/image 155), previously 7.5 x 6.9 cm.  Additional omental caking beneath the left mid abdominal wall  measuring 2.9 x 9.7 cm (series 2/image 182), max SUV 4.3 (previously  3.5 x 11.1 cm with max SUV 7.3).  No hypermetabolic lymph nodes in the abdomen or pelvis.  SKELETON  No focal hypermetabolic activity to suggest skeletal metastasis.  IMPRESSION:  Improving peritoneal disease/omental caking, as described above, max  SUV 4.3.  Small volume abdominopelvic ascites, decreased.  Trace left pleural effusion, decreased.   Results for CHERAMIE, LINNE (MRN [id number]) as of 02/26/2013 10:28  Ref. Range 12/17/2012 11:57 01/28/2013 09:57 02/25/2013 11:40  CA 125 Latest Range: 0.0-30.2 U/mL  334.0 (H) 169.4 (H) 140.3 (H)   Impression and Plan:  58 year old woman with the following issues:  1. Peritoneal carcinomatosis with omental involvement. The pathology confirmed the presence of adenocarcinoma of likely GYN etiology. Her CA 125 is elevated at 333.5. She tolerated the first 5 cycle of chemotherapy without any major complications. PET CT scan results on 02/25/2013 as were discussed and showed excellent response at this time. The plan is to proceed with cycle 7 of this chemotherapy after one-week delay due to thrombocytopenia as well as her impending dental work. Her tumor marker have also responded with a drop from 334 down to 140.  2. Neutropenia prophylaxis she will receive Neulasta after each cycle.  3. IV access: She had a Port-A-Cath inserted without any complications and she has Emla cream as well.   4. diarrhea: Says improved at this time  5. Nausea prophylaxis: She was given a  prescription for Zofran.  6. Psychosocial support: Given her history of mental health disorder and given her options of counseling as well as social work services at this point she appeared to be compensated and reasonably in good mood.  7. Genetic counseling: She does have a rather strong maternal family history of early breast cancer as well as a personal history of breast cancer now she is developing possibly another cancer including omental carcinomatosis I think she would be of prime candidate for genetic counseling to identify a possible genetic disorder.  8. The followup: In one week for cycle 7 of chemotherapy and in 4 weeks for the next cycle of chemotherapy.     [facility], MD 1/8/201510:27 AM

## 2013-02-27 ENCOUNTER — Encounter: Payer: Self-pay | Admitting: Oncology

## 2013-03-03 ENCOUNTER — Institutional Professional Consult (permissible substitution): Payer: BC Managed Care – PPO | Admitting: Pulmonary Disease

## 2013-03-05 ENCOUNTER — Other Ambulatory Visit (HOSPITAL_BASED_OUTPATIENT_CLINIC_OR_DEPARTMENT_OTHER): Payer: BC Managed Care – PPO

## 2013-03-05 ENCOUNTER — Other Ambulatory Visit: Payer: Self-pay | Admitting: *Deleted

## 2013-03-05 ENCOUNTER — Ambulatory Visit (HOSPITAL_BASED_OUTPATIENT_CLINIC_OR_DEPARTMENT_OTHER): Payer: BC Managed Care – PPO

## 2013-03-05 VITALS — BP 122/71 | HR 74 | Temp 98.0°F | Resp 18

## 2013-03-05 DIAGNOSIS — C801 Malignant (primary) neoplasm, unspecified: Secondary | ICD-10-CM

## 2013-03-05 DIAGNOSIS — C786 Secondary malignant neoplasm of retroperitoneum and peritoneum: Secondary | ICD-10-CM

## 2013-03-05 DIAGNOSIS — Z5111 Encounter for antineoplastic chemotherapy: Secondary | ICD-10-CM

## 2013-03-05 DIAGNOSIS — Z853 Personal history of malignant neoplasm of breast: Secondary | ICD-10-CM

## 2013-03-05 DIAGNOSIS — C50919 Malignant neoplasm of unspecified site of unspecified female breast: Secondary | ICD-10-CM

## 2013-03-05 LAB — CBC WITH DIFFERENTIAL/PLATELET
BASO%: 0.6 % (ref 0.0–2.0)
BASOS ABS: 0 10*3/uL (ref 0.0–0.1)
EOS%: 2.3 % (ref 0.0–7.0)
Eosinophils Absolute: 0.1 10*3/uL (ref 0.0–0.5)
HCT: 32 % — ABNORMAL LOW (ref 34.8–46.6)
HEMOGLOBIN: 10.4 g/dL — AB (ref 11.6–15.9)
LYMPH%: 36.9 % (ref 14.0–49.7)
MCH: 31.3 pg (ref 25.1–34.0)
MCHC: 32.4 g/dL (ref 31.5–36.0)
MCV: 96.7 fL (ref 79.5–101.0)
MONO#: 0.5 10*3/uL (ref 0.1–0.9)
MONO%: 15.5 % — AB (ref 0.0–14.0)
NEUT%: 44.7 % (ref 38.4–76.8)
NEUTROS ABS: 1.5 10*3/uL (ref 1.5–6.5)
Platelets: 108 10*3/uL — ABNORMAL LOW (ref 145–400)
RBC: 3.31 10*6/uL — ABNORMAL LOW (ref 3.70–5.45)
RDW: 15.8 % — AB (ref 11.2–14.5)
WBC: 3.3 10*3/uL — ABNORMAL LOW (ref 3.9–10.3)
lymph#: 1.2 10*3/uL (ref 0.9–3.3)

## 2013-03-05 LAB — COMPREHENSIVE METABOLIC PANEL (CC13)
ALK PHOS: 72 U/L (ref 40–150)
ALT: 11 U/L (ref 0–55)
AST: 18 U/L (ref 5–34)
Albumin: 3.2 g/dL — ABNORMAL LOW (ref 3.5–5.0)
Anion Gap: 7 mEq/L (ref 3–11)
BUN: 15.7 mg/dL (ref 7.0–26.0)
CALCIUM: 9.3 mg/dL (ref 8.4–10.4)
CHLORIDE: 106 meq/L (ref 98–109)
CO2: 29 mEq/L (ref 22–29)
Creatinine: 0.7 mg/dL (ref 0.6–1.1)
GLUCOSE: 105 mg/dL (ref 70–140)
POTASSIUM: 4.5 meq/L (ref 3.5–5.1)
SODIUM: 142 meq/L (ref 136–145)
TOTAL PROTEIN: 8.1 g/dL (ref 6.4–8.3)
Total Bilirubin: 0.22 mg/dL (ref 0.20–1.20)

## 2013-03-05 MED ORDER — HEPARIN SOD (PORK) LOCK FLUSH 100 UNIT/ML IV SOLN
500.0000 [IU] | Freq: Once | INTRAVENOUS | Status: AC | PRN
  Administered 2013-03-05: 500 [IU]
  Filled 2013-03-05: qty 5

## 2013-03-05 MED ORDER — ONDANSETRON 16 MG/50ML IVPB (CHCC)
16.0000 mg | Freq: Once | INTRAVENOUS | Status: AC
  Administered 2013-03-05: 16 mg via INTRAVENOUS

## 2013-03-05 MED ORDER — ONDANSETRON 16 MG/50ML IVPB (CHCC)
INTRAVENOUS | Status: AC
  Filled 2013-03-05: qty 16

## 2013-03-05 MED ORDER — CARBOPLATIN CHEMO INTRADERMAL TEST DOSE 100MCG/0.02ML
100.0000 ug | Freq: Once | INTRADERMAL | Status: AC
  Administered 2013-03-05: 100 ug via INTRADERMAL
  Filled 2013-03-05: qty 0.01

## 2013-03-05 MED ORDER — DEXAMETHASONE SODIUM PHOSPHATE 20 MG/5ML IJ SOLN
INTRAMUSCULAR | Status: AC
  Filled 2013-03-05: qty 5

## 2013-03-05 MED ORDER — DEXAMETHASONE SODIUM PHOSPHATE 20 MG/5ML IJ SOLN
20.0000 mg | Freq: Once | INTRAMUSCULAR | Status: AC
  Administered 2013-03-05: 20 mg via INTRAVENOUS

## 2013-03-05 MED ORDER — SODIUM CHLORIDE 0.9 % IJ SOLN
10.0000 mL | INTRAMUSCULAR | Status: DC | PRN
  Administered 2013-03-05: 10 mL
  Filled 2013-03-05: qty 10

## 2013-03-05 MED ORDER — SODIUM CHLORIDE 0.9 % IV SOLN
60.0000 mg/m2 | Freq: Once | INTRAVENOUS | Status: AC
  Administered 2013-03-05: 110 mg via INTRAVENOUS
  Filled 2013-03-05: qty 11

## 2013-03-05 MED ORDER — SODIUM CHLORIDE 0.9 % IV SOLN
530.0000 mg | Freq: Once | INTRAVENOUS | Status: AC
  Administered 2013-03-05: 530 mg via INTRAVENOUS
  Filled 2013-03-05: qty 53

## 2013-03-05 MED ORDER — SODIUM CHLORIDE 0.9 % IV SOLN
Freq: Once | INTRAVENOUS | Status: AC
  Administered 2013-03-05: 10:00:00 via INTRAVENOUS

## 2013-03-05 NOTE — Progress Notes (Signed)
Informed Dr. Alen Blew pt had a root canal 2 days ago and he instructed to hold Avastin today.

## 2013-03-05 NOTE — Patient Instructions (Signed)
[facility] Discharge Instructions for Patients Receiving Chemotherapy  Today you received the following chemotherapy agents Taxotere and Carboplatin.  To help prevent nausea and vomiting after your treatment, we encourage you to take your nausea medication as directed.   If you develop nausea and vomiting that is not controlled by your nausea medication, call the clinic.   BELOW ARE SYMPTOMS THAT SHOULD BE REPORTED IMMEDIATELY:  *FEVER GREATER THAN 100.5 F  *CHILLS WITH OR WITHOUT FEVER  NAUSEA AND VOMITING THAT IS NOT CONTROLLED WITH YOUR NAUSEA MEDICATION  *UNUSUAL SHORTNESS OF BREATH  *UNUSUAL BRUISING OR BLEEDING  TENDERNESS IN MOUTH AND THROAT WITH OR WITHOUT PRESENCE OF ULCERS  *URINARY PROBLEMS  *BOWEL PROBLEMS  UNUSUAL RASH Items with * indicate a potential emergency and should be followed up as soon as possible.  Feel free to call the clinic you have any questions or concerns. The clinic phone number is [phone].    

## 2013-03-06 ENCOUNTER — Ambulatory Visit (HOSPITAL_BASED_OUTPATIENT_CLINIC_OR_DEPARTMENT_OTHER): Payer: BC Managed Care – PPO

## 2013-03-06 VITALS — BP 130/81 | HR 79 | Temp 98.0°F

## 2013-03-06 DIAGNOSIS — C50919 Malignant neoplasm of unspecified site of unspecified female breast: Secondary | ICD-10-CM

## 2013-03-06 DIAGNOSIS — C786 Secondary malignant neoplasm of retroperitoneum and peritoneum: Secondary | ICD-10-CM

## 2013-03-06 DIAGNOSIS — C801 Malignant (primary) neoplasm, unspecified: Secondary | ICD-10-CM

## 2013-03-06 DIAGNOSIS — Z5189 Encounter for other specified aftercare: Secondary | ICD-10-CM

## 2013-03-06 MED ORDER — PEGFILGRASTIM INJECTION 6 MG/0.6ML
6.0000 mg | Freq: Once | SUBCUTANEOUS | Status: AC
  Administered 2013-03-06: 6 mg via SUBCUTANEOUS
  Filled 2013-03-06: qty 0.6

## 2013-03-24 ENCOUNTER — Other Ambulatory Visit: Payer: Self-pay

## 2013-03-24 DIAGNOSIS — Z1231 Encounter for screening mammogram for malignant neoplasm of breast: Secondary | ICD-10-CM

## 2013-03-25 ENCOUNTER — Institutional Professional Consult (permissible substitution): Payer: BC Managed Care – PPO | Admitting: Pulmonary Disease

## 2013-03-25 ENCOUNTER — Other Ambulatory Visit: Payer: Self-pay | Admitting: *Deleted

## 2013-03-26 ENCOUNTER — Other Ambulatory Visit (HOSPITAL_BASED_OUTPATIENT_CLINIC_OR_DEPARTMENT_OTHER): Payer: BC Managed Care – PPO

## 2013-03-26 ENCOUNTER — Ambulatory Visit (HOSPITAL_BASED_OUTPATIENT_CLINIC_OR_DEPARTMENT_OTHER): Payer: BC Managed Care – PPO | Admitting: Oncology

## 2013-03-26 ENCOUNTER — Ambulatory Visit (HOSPITAL_BASED_OUTPATIENT_CLINIC_OR_DEPARTMENT_OTHER): Payer: BC Managed Care – PPO

## 2013-03-26 ENCOUNTER — Encounter: Payer: Self-pay | Admitting: Oncology

## 2013-03-26 ENCOUNTER — Telehealth: Payer: Self-pay | Admitting: Oncology

## 2013-03-26 VITALS — BP 125/81 | HR 77 | Temp 97.0°F | Resp 18 | Ht 60.0 in | Wt 157.0 lb

## 2013-03-26 DIAGNOSIS — D696 Thrombocytopenia, unspecified: Secondary | ICD-10-CM

## 2013-03-26 DIAGNOSIS — C786 Secondary malignant neoplasm of retroperitoneum and peritoneum: Secondary | ICD-10-CM

## 2013-03-26 DIAGNOSIS — C50919 Malignant neoplasm of unspecified site of unspecified female breast: Secondary | ICD-10-CM

## 2013-03-26 DIAGNOSIS — Z5111 Encounter for antineoplastic chemotherapy: Secondary | ICD-10-CM

## 2013-03-26 DIAGNOSIS — C801 Malignant (primary) neoplasm, unspecified: Secondary | ICD-10-CM

## 2013-03-26 DIAGNOSIS — R180 Malignant ascites: Secondary | ICD-10-CM

## 2013-03-26 DIAGNOSIS — Z5112 Encounter for antineoplastic immunotherapy: Secondary | ICD-10-CM

## 2013-03-26 LAB — COMPREHENSIVE METABOLIC PANEL (CC13)
ALT: 14 U/L (ref 0–55)
AST: 23 U/L (ref 5–34)
Albumin: 3.7 g/dL (ref 3.5–5.0)
Alkaline Phosphatase: 66 U/L (ref 40–150)
Anion Gap: 8 mEq/L (ref 3–11)
BUN: 15.6 mg/dL (ref 7.0–26.0)
CALCIUM: 9.5 mg/dL (ref 8.4–10.4)
CO2: 27 mEq/L (ref 22–29)
CREATININE: 0.7 mg/dL (ref 0.6–1.1)
Chloride: 107 mEq/L (ref 98–109)
Glucose: 92 mg/dl (ref 70–140)
Potassium: 4.5 mEq/L (ref 3.5–5.1)
Sodium: 142 mEq/L (ref 136–145)
Total Bilirubin: 0.23 mg/dL (ref 0.20–1.20)
Total Protein: 8.3 g/dL (ref 6.4–8.3)

## 2013-03-26 LAB — CBC WITH DIFFERENTIAL/PLATELET
BASO%: 1 % (ref 0.0–2.0)
Basophils Absolute: 0 10*3/uL (ref 0.0–0.1)
EOS ABS: 0 10*3/uL (ref 0.0–0.5)
EOS%: 0.3 % (ref 0.0–7.0)
HEMATOCRIT: 31.2 % — AB (ref 34.8–46.6)
HEMOGLOBIN: 10.2 g/dL — AB (ref 11.6–15.9)
LYMPH%: 35.4 % (ref 14.0–49.7)
MCH: 31.5 pg (ref 25.1–34.0)
MCHC: 32.7 g/dL (ref 31.5–36.0)
MCV: 96.3 fL (ref 79.5–101.0)
MONO#: 0.6 10*3/uL (ref 0.1–0.9)
MONO%: 15.5 % — ABNORMAL HIGH (ref 0.0–14.0)
NEUT%: 47.8 % (ref 38.4–76.8)
NEUTROS ABS: 1.8 10*3/uL (ref 1.5–6.5)
Platelets: 90 10*3/uL — ABNORMAL LOW (ref 145–400)
RBC: 3.24 10*6/uL — ABNORMAL LOW (ref 3.70–5.45)
RDW: 16.3 % — AB (ref 11.2–14.5)
WBC: 3.8 10*3/uL — ABNORMAL LOW (ref 3.9–10.3)
lymph#: 1.3 10*3/uL (ref 0.9–3.3)

## 2013-03-26 LAB — CA 125: CA 125: 109 U/mL — AB (ref 0.0–30.2)

## 2013-03-26 MED ORDER — DEXAMETHASONE SODIUM PHOSPHATE 20 MG/5ML IJ SOLN
20.0000 mg | Freq: Once | INTRAMUSCULAR | Status: AC
  Administered 2013-03-26: 20 mg via INTRAVENOUS

## 2013-03-26 MED ORDER — ONDANSETRON 16 MG/50ML IVPB (CHCC)
16.0000 mg | Freq: Once | INTRAVENOUS | Status: AC
  Administered 2013-03-26: 16 mg via INTRAVENOUS

## 2013-03-26 MED ORDER — DEXAMETHASONE SODIUM PHOSPHATE 20 MG/5ML IJ SOLN
INTRAMUSCULAR | Status: AC
  Filled 2013-03-26: qty 5

## 2013-03-26 MED ORDER — SODIUM CHLORIDE 0.9 % IV SOLN
110.0000 mg | Freq: Once | INTRAVENOUS | Status: AC
  Administered 2013-03-26: 110 mg via INTRAVENOUS
  Filled 2013-03-26: qty 11

## 2013-03-26 MED ORDER — DOCETAXEL CHEMO INJECTION 160 MG/16ML: 60.0000 mg/m2 | Freq: Once | INTRAVENOUS | Status: DC

## 2013-03-26 MED ORDER — SODIUM CHLORIDE 0.9 % IV SOLN
530.0000 mg | Freq: Once | INTRAVENOUS | Status: AC
  Administered 2013-03-26: 530 mg via INTRAVENOUS
  Filled 2013-03-26: qty 53

## 2013-03-26 MED ORDER — ONDANSETRON 16 MG/50ML IVPB (CHCC)
INTRAVENOUS | Status: AC
  Filled 2013-03-26: qty 16

## 2013-03-26 MED ORDER — SODIUM CHLORIDE 0.9 % IV SOLN
Freq: Once | INTRAVENOUS | Status: AC
  Administered 2013-03-26: 11:00:00 via INTRAVENOUS

## 2013-03-26 MED ORDER — SODIUM CHLORIDE 0.9 % IV SOLN
15.0000 mg/kg | Freq: Once | INTRAVENOUS | Status: AC
  Administered 2013-03-26: 1150 mg via INTRAVENOUS
  Filled 2013-03-26: qty 46

## 2013-03-26 MED ORDER — SODIUM CHLORIDE 0.9 % IJ SOLN
10.0000 mL | INTRAMUSCULAR | Status: DC | PRN
  Administered 2013-03-26: 10 mL
  Filled 2013-03-26: qty 10

## 2013-03-26 MED ORDER — CARBOPLATIN CHEMO INTRADERMAL TEST DOSE 100MCG/0.02ML
100.0000 ug | Freq: Once | INTRADERMAL | Status: AC
  Administered 2013-03-26: 100 ug via INTRADERMAL
  Filled 2013-03-26: qty 0.01

## 2013-03-26 MED ORDER — HEPARIN SOD (PORK) LOCK FLUSH 100 UNIT/ML IV SOLN
500.0000 [IU] | Freq: Once | INTRAVENOUS | Status: AC | PRN
  Administered 2013-03-26: 500 [IU]
  Filled 2013-03-26: qty 5

## 2013-03-26 NOTE — Telephone Encounter (Signed)
gv pt appt schedule for feb/march °

## 2013-03-26 NOTE — Patient Instructions (Signed)
[facility] Discharge Instructions for Patients Receiving Chemotherapy  Today you received the following chemotherapy agents :  Avastin,  Taxotere,  Carboplatin.  To help prevent nausea and vomiting after your treatment, we encourage you to take your nausea medication as instructed by your physician.   If you develop nausea and vomiting that is not controlled by your nausea medication, call the clinic.   BELOW ARE SYMPTOMS THAT SHOULD BE REPORTED IMMEDIATELY:  *FEVER GREATER THAN 100.5 F  *CHILLS WITH OR WITHOUT FEVER  NAUSEA AND VOMITING THAT IS NOT CONTROLLED WITH YOUR NAUSEA MEDICATION  *UNUSUAL SHORTNESS OF BREATH  *UNUSUAL BRUISING OR BLEEDING  TENDERNESS IN MOUTH AND THROAT WITH OR WITHOUT PRESENCE OF ULCERS  *URINARY PROBLEMS  *BOWEL PROBLEMS  UNUSUAL RASH Items with * indicate a potential emergency and should be followed up as soon as possible.  Feel free to call the clinic you have any questions or concerns. The clinic phone number is [phone].

## 2013-03-26 NOTE — Progress Notes (Signed)
Hematology and Oncology Follow Up Visit  [name] [id number] Oct 28, 1955 58 y.o. 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 This was diagnosed in July of 2014.   Prior Therapy:  She is status post paracentesis performed on 09/17/2012 with the cytology confirmed the presence of adenocarcinoma and immunohistochemical stains suggest GYN etiology. She is also status post lumpectomy for breast cancer diagnosed 25 years ago followed by radiation and chemotherapy under the care of Dr. Sonny Dandy. She did not receive any hormonal therapy.  Current therapy: Chemotherapy utilizing carboplatin and Taxotere started on 10/01/2012. Avastin was added with cycle 4. She is here for cycle 8.  Interim History: [name] is a pleasant woman presented with peritoneal carcinomatosis and ascites and the diagnosis mentioned above. Since her last visit, she tolerated cycle 7 of chemotherapy  with few complications: She reports grade 1 diarrhea, grade 1 fatigue but no fevers or chills. She has reported also grade 1 neuropathy and grade 1 nausea. She does report decrease in her abdominal fullness and some loose bowel habits. Her appetite is down but still able to perform most activities of daily living. She had not reported any fevers or chills or sweats. She has not any shortness of breath or chest pain. She did not report any genitourinary or GYN bleeding. She has not reported any recent complications or illnesses. She has not reported any bleeding or infections. She she had dental work done without any incident. She did not report any bleeding or infection.  Medications: I have reviewed the patient's current medications.  Current Outpatient Prescriptions  Medication Sig Dispense Refill  . ALPRAZolam (XANAX) 0.25 MG tablet Take 0.25 mg by  mouth. 1-2 by mouth at bedtime as needed, Rx'ed by Dr.Taylor      . clotrimazole-betamethasone (LOTRISONE) cream Apply 1 application topically 2 (two) times daily.  30 g  0  . diphenoxylate-atropine (LOMOTIL) 2.5-0.025 MG per tablet Take 1 tablet by mouth 4 (four) times daily as needed for diarrhea or loose stools.  30 tablet  0  . divalproex (DEPAKOTE ER) 500 MG 24 hr tablet Take 1,000 mg by mouth at bedtime.      . gabapentin (NEURONTIN) 100 MG capsule TAKE 1 CAPSULE BY MOUTH 3 TIMES DAILY.  90 capsule  0  . lamoTRIgine (LAMICTAL) 150 MG tablet Take 150 mg by mouth daily.      Marland Kitchen levothyroxine (SYNTHROID, LEVOTHROID) 100 MCG tablet Take 50-100 mcg by mouth daily before breakfast. Takes 1 tablet every day except Wednesday she takes 1/2 tablet      . lidocaine-prilocaine (EMLA) cream Apply topically as needed. Apply to port with every chemotherapy.  30 g  1  . ondansetron (ZOFRAN) 8 MG tablet Take 1 tablet (8 mg total) by mouth every 8 (eight) hours as needed for nausea.  20 tablet  0  . Probiotic Product (ALIGN PO) Take 1 tablet by mouth daily.       Marland Kitchen rOPINIRole (REQUIP) 1 MG tablet TAKE 1 TABLET BY MOUTH AT BEDTIME 3 HOURS BEFORE SLEEP  30 tablet  5  . sodium chloride (OCEAN) 0.65 % SOLN nasal spray Place 1 spray into both nostrils as needed for congestion.       No current facility-administered medications for this visit.     Allergies: No Known Allergies  Past Medical History, Surgical history, Social history, and  Family History were reviewed and updated.  Review of Systems: Remaining ROS negative. Physical Exam: Blood pressure 125/81, pulse 77, temperature 97 F (36.1 C), temperature source Oral, resp. rate 18, height 5' (1.524 m), weight 157 lb (71.215 kg). ECOG: 1 General appearance: alert, cooperative and appears stated age Head: Normocephalic, without obvious abnormality, atraumatic Neck: no adenopathy, no carotid bruit, no JVD, supple, symmetrical, trachea midline and thyroid  not enlarged, symmetric, no tenderness/mass/nodules Lymph nodes: Cervical, supraclavicular, and axillary nodes normal. Heart:regular rate and rhythm, S1, S2 normal, no murmur, click, rub or gallop Lung:chest clear, no wheezing, rales, normal symmetric air entry Abdomin: soft, distended and shifting dullness noted.  it was not tender to deep palpation. EXT:no erythema, induration, or nodules   Lab Results: Lab Results  Component Value Date   WBC 3.8* 03/26/2013   HGB 10.2* 03/26/2013   HCT 31.2* 03/26/2013   MCV 96.3 03/26/2013   PLT 90* 03/26/2013     Chemistry      Component Value Date/Time   NA 142 03/05/2013 0944   NA 136 07/03/2012 1359   K 4.5 03/05/2013 0944   K 4.1 07/03/2012 1359   CL 103 07/03/2012 1359   CO2 29 03/05/2013 0944   CO2 30 07/03/2012 1359   BUN 15.7 03/05/2013 0944   BUN 9 07/03/2012 1359   CREATININE 0.7 03/05/2013 0944   CREATININE 0.7 07/03/2012 1359      Component Value Date/Time   CALCIUM 9.3 03/05/2013 0944   CALCIUM 9.6 07/03/2012 1359   ALKPHOS 72 03/05/2013 0944   ALKPHOS 80 07/03/2012 1359   AST 18 03/05/2013 0944   AST 42* 07/03/2012 1359   ALT 11 03/05/2013 0944   ALT 30 07/03/2012 1359   BILITOT 0.22 03/05/2013 0944   BILITOT 0.5 07/03/2012 1359      Results for [name], [name] (MRN [id number]) as of 03/26/2013 08:57  Ref. Range 12/17/2012 11:57 01/28/2013 09:57 02/25/2013 11:40  CA 125 Latest Range: 0.0-30.2 U/mL 334.0 (H) 169.4 (H) 140.3 (H)    Impression and Plan:  58 year old woman with the following issues:  1. Peritoneal carcinomatosis with omental involvement. The pathology confirmed the presence of adenocarcinoma of likely GYN etiology. Her CA 125 is elevated at 333.5. She tolerated the first 7 cycle of chemotherapy without any major complications. PET CT scan results on 02/25/2013  showed excellent response at this time. The plan is to proceed with cycle 8 of this chemotherapy with a Avastin at this time. I will repeat cycle 9 and cycle 10 before  another PET CT scan probably in end of March 2015. Her [place] is about 1000 which is an acceptable cut off to treat her.  2. Neutropenia prophylaxis she will receive Neulasta after each cycle.  3. IV access: She had a Port-A-Cath inserted without any complications and she has Emla cream as well.   4. Thrombocytopenia: The cuff to treat her will be 90,000. Anything below that in the future I recommend delaying the chemotherapy for one week.  5. Nausea prophylaxis: She was given a prescription for Zofran.  6. Psychosocial support: Given her history of mental health disorder and given her options of counseling as well as social work services at this point she appeared to be compensated and reasonably in good mood.  7. Genetic counseling: She does have a rather strong maternal family history of early breast cancer as well as a personal history of breast cancer now she is developing possibly another cancer including omental carcinomatosis I  think she would be of prime candidate for genetic counseling to identify a possible genetic disorder.  8. The followup: In 3 weeks for the next cycle of chemotherapy.    [facility], MD 2/5/20159:21 AM

## 2013-03-27 ENCOUNTER — Ambulatory Visit (HOSPITAL_BASED_OUTPATIENT_CLINIC_OR_DEPARTMENT_OTHER): Payer: BC Managed Care – PPO

## 2013-03-27 VITALS — BP 121/47 | HR 73 | Temp 98.0°F

## 2013-03-27 DIAGNOSIS — Z5189 Encounter for other specified aftercare: Secondary | ICD-10-CM

## 2013-03-27 DIAGNOSIS — C50919 Malignant neoplasm of unspecified site of unspecified female breast: Secondary | ICD-10-CM

## 2013-03-27 DIAGNOSIS — C786 Secondary malignant neoplasm of retroperitoneum and peritoneum: Secondary | ICD-10-CM

## 2013-03-27 DIAGNOSIS — C801 Malignant (primary) neoplasm, unspecified: Secondary | ICD-10-CM

## 2013-03-27 MED ORDER — PEGFILGRASTIM INJECTION 6 MG/0.6ML
6.0000 mg | Freq: Once | SUBCUTANEOUS | Status: AC
  Administered 2013-03-27: 6 mg via SUBCUTANEOUS
  Filled 2013-03-27: qty 0.6

## 2013-04-06 ENCOUNTER — Other Ambulatory Visit: Payer: Self-pay | Admitting: Oncology

## 2013-04-14 ENCOUNTER — Ambulatory Visit
Admission: RE | Admit: 2013-04-14 | Discharge: 2013-04-14 | Disposition: A | Discharge status: Home / Self Care | Payer: BC Managed Care – PPO | Source: Ambulatory Visit

## 2013-04-14 DIAGNOSIS — Z1231 Encounter for screening mammogram for malignant neoplasm of breast: Secondary | ICD-10-CM

## 2013-04-15 ENCOUNTER — Other Ambulatory Visit: Payer: Self-pay | Admitting: Internal Medicine

## 2013-04-15 NOTE — Telephone Encounter (Signed)
OK X1, R X 2 

## 2013-04-15 NOTE — Telephone Encounter (Signed)
Rx sent to the pharmacy by e-script.//AB/CMA 

## 2013-04-15 NOTE — Telephone Encounter (Signed)
Requesting Requip 1mg  Take 1 tablet by mouth 3 hours before bedtime. Last refill:10-17-12:#30,5 Last OV:07-03-12 Please advise.//AB/CMA

## 2013-04-16 ENCOUNTER — Ambulatory Visit: Payer: BC Managed Care – PPO

## 2013-04-16 ENCOUNTER — Telehealth: Payer: Self-pay | Admitting: Oncology

## 2013-04-16 ENCOUNTER — Ambulatory Visit (HOSPITAL_BASED_OUTPATIENT_CLINIC_OR_DEPARTMENT_OTHER): Payer: BC Managed Care – PPO | Admitting: Physician Assistant

## 2013-04-16 ENCOUNTER — Other Ambulatory Visit (HOSPITAL_BASED_OUTPATIENT_CLINIC_OR_DEPARTMENT_OTHER): Payer: BC Managed Care – PPO

## 2013-04-16 ENCOUNTER — Encounter: Payer: Self-pay | Admitting: Physician Assistant

## 2013-04-16 VITALS — BP 124/51 | HR 81 | Temp 97.2°F | Resp 18 | Ht 60.0 in | Wt 156.3 lb

## 2013-04-16 DIAGNOSIS — D696 Thrombocytopenia, unspecified: Secondary | ICD-10-CM

## 2013-04-16 DIAGNOSIS — C786 Secondary malignant neoplasm of retroperitoneum and peritoneum: Secondary | ICD-10-CM

## 2013-04-16 DIAGNOSIS — C801 Malignant (primary) neoplasm, unspecified: Secondary | ICD-10-CM

## 2013-04-16 LAB — CBC WITH DIFFERENTIAL/PLATELET
BASO%: 0.6 % (ref 0.0–2.0)
BASOS ABS: 0 10*3/uL (ref 0.0–0.1)
EOS%: 0 % (ref 0.0–7.0)
Eosinophils Absolute: 0 10*3/uL (ref 0.0–0.5)
HEMATOCRIT: 30.9 % — AB (ref 34.8–46.6)
HEMOGLOBIN: 9.6 g/dL — AB (ref 11.6–15.9)
LYMPH%: 39.4 % (ref 14.0–49.7)
MCH: 30.9 pg (ref 25.1–34.0)
MCHC: 31.1 g/dL — ABNORMAL LOW (ref 31.5–36.0)
MCV: 99.4 fL (ref 79.5–101.0)
MONO#: 0.5 10*3/uL (ref 0.1–0.9)
MONO%: 15.2 % — AB (ref 0.0–14.0)
NEUT#: 1.4 10*3/uL — ABNORMAL LOW (ref 1.5–6.5)
NEUT%: 44.8 % (ref 38.4–76.8)
Platelets: 73 10*3/uL — ABNORMAL LOW (ref 145–400)
RBC: 3.11 10*6/uL — ABNORMAL LOW (ref 3.70–5.45)
RDW: 16.5 % — ABNORMAL HIGH (ref 11.2–14.5)
WBC: 3.2 10*3/uL — ABNORMAL LOW (ref 3.9–10.3)
lymph#: 1.3 10*3/uL (ref 0.9–3.3)
nRBC: 0 % (ref 0–0)

## 2013-04-16 NOTE — Telephone Encounter (Signed)
gv adn printed appt sched and avs for pt for March...sed added tx. °

## 2013-04-16 NOTE — Progress Notes (Signed)
Hematology and Oncology Follow Up Visit  [name] [id number] 1955/05/30 58 y.o. 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 This was diagnosed in July of 2014.   Prior Therapy:  She is status post paracentesis performed on 09/17/2012 with the cytology confirmed the presence of adenocarcinoma and immunohistochemical stains suggest GYN etiology. She is also status post lumpectomy for breast cancer diagnosed 25 years ago followed by radiation and chemotherapy under the care of Dr. Sonny Dandy. She did not receive any hormonal therapy.  Current therapy: Chemotherapy utilizing carboplatin and Taxotere started on 10/01/2012. Avastin was added with cycle 4. Status post 8 cycles. Of note patient's Avastin was held cycle 6 and cycle 7 secondary to some dental work. She is here for cycle 9.  Interim History: Mrs. Debartolo is a pleasant woman presented with peritoneal carcinomatosis and ascites and the diagnosis mentioned above. Since her last visit, she tolerated cycle 8 of chemotherapy  with few complications: She reports grade 1 diarrhea, grade 1 fatigue but no fevers or chills. She does complain of increased nausea with last cycle of chemotherapy. She notes loose stool but it depends on what she eats. She denied any specific pain. She also has some occasional blood in her stool that is bright red and shows up in the water in the toilet bowl. She's had some nosebleeds take 5-10 minutes. The nosebleeds usually begin after blowing her nose. She's also had some bleeding in her gums when she brushes and flosses. appear She has reported also grade 1 neuropathy. Her appetite is down but still able to perform most activities of daily living. She had not reported any fevers or chills or sweats. She has not any shortness of breath or chest  pain. She did not report any GYN bleeding. She has not reported any recent complications or illnesses. She has not reported any infections.   Medications: I have reviewed the patient's current medications.  Current Outpatient Prescriptions  Medication Sig Dispense Refill  . ALPRAZolam (XANAX) 0.25 MG tablet Take 0.25 mg by mouth. 1-2 by mouth at bedtime as needed, Rx'ed by Dr.Taylor      . clotrimazole-betamethasone (LOTRISONE) cream Apply 1 application topically 2 (two) times daily.  30 g  0  . diphenoxylate-atropine (LOMOTIL) 2.5-0.025 MG per tablet TAKE 1 TABLET BY MOUTH 4 TIMES A DAY AS NEEDED FOR DIARRHEA OR LOOSE STOOLS  30 tablet  0  . divalproex (DEPAKOTE ER) 500 MG 24 hr tablet Take 1,000 mg by mouth at bedtime.      . gabapentin (NEURONTIN) 100 MG capsule TAKE 1 CAPSULE BY MOUTH 3 TIMES DAILY.  90 capsule  0  . lamoTRIgine (LAMICTAL) 150 MG tablet Take 150 mg by mouth daily.      Marland Kitchen levothyroxine (SYNTHROID, LEVOTHROID) 100 MCG tablet Take 50-100 mcg by mouth daily before breakfast. Takes 1 tablet every day except Wednesday she takes 1/2 tablet      . lidocaine-prilocaine (EMLA) cream Apply topically as needed. Apply to port with every chemotherapy.  30 g  1  . ondansetron (ZOFRAN) 8 MG tablet Take 1 tablet (8 mg total) by mouth every 8 (eight) hours as needed for nausea.  20 tablet  0  . rOPINIRole (REQUIP) 1 MG tablet TAKE 1 TABLET BY MOUTH 3 HOURS BEFORE BEDTIME  30 tablet  2  . sodium chloride (OCEAN) 0.65 %  SOLN nasal spray Place 1 spray into both nostrils as needed for congestion.      . Probiotic Product (ALIGN PO) Take 1 tablet by mouth daily.        No current facility-administered medications for this visit.     Allergies: No Known Allergies  Past Medical History, Surgical history, Social history, and Family History were reviewed and updated.  Review of Systems: Remaining ROS negative. Physical Exam: Blood pressure 124/51, pulse 81, temperature 97.2 F (36.2 C),  temperature source Oral, resp. rate 18, height 5' (1.524 m), weight 156 lb 4.8 oz (70.897 kg), SpO2 98.00%.  ECOG: 1  General appearance: alert, cooperative and appears stated age Head: Normocephalic, without obvious abnormality, atraumatic Neck: no adenopathy, no carotid bruit, no JVD, supple, symmetrical, trachea midline and thyroid not enlarged, symmetric, no tenderness/mass/nodules Lymph nodes: Cervical, supraclavicular, and axillary nodes normal. Heart:regular rate and rhythm, S1, S2 normal, no murmur, click, rub or gallop Lung:chest clear, no wheezing, rales, normal symmetric air entry Abdomin: soft, distended and shifting dullness noted.  it was not tender to deep palpation. Bowel sounds present in all quadrants EXT:no erythema, induration, or nodules   Lab Results: Lab Results  Component Value Date   WBC 3.2* 04/16/2013   HGB 9.6* 04/16/2013   HCT 30.9* 04/16/2013   MCV 99.4 04/16/2013   PLT 73* 04/16/2013     Chemistry      Component Value Date/Time   NA 142 03/26/2013 0849   NA 136 07/03/2012 1359   K 4.5 03/26/2013 0849   K 4.1 07/03/2012 1359   CL 103 07/03/2012 1359   CO2 27 03/26/2013 0849   CO2 30 07/03/2012 1359   BUN 15.6 03/26/2013 0849   BUN 9 07/03/2012 1359   CREATININE 0.7 03/26/2013 0849   CREATININE 0.7 07/03/2012 1359      Component Value Date/Time   CALCIUM 9.5 03/26/2013 0849   CALCIUM 9.6 07/03/2012 1359   ALKPHOS 66 03/26/2013 0849   ALKPHOS 80 07/03/2012 1359   AST 23 03/26/2013 0849   AST 42* 07/03/2012 1359   ALT 14 03/26/2013 0849   ALT 30 07/03/2012 1359   BILITOT 0.23 03/26/2013 0849   BILITOT 0.5 07/03/2012 1359      Results for Julie, Padilla (MRN [id number]) as of 03/26/2013 08:57  Ref. Range 12/17/2012 11:57 01/28/2013 09:57 02/25/2013 11:40  CA 125 Latest Range: 0.0-30.2 U/mL 334.0 (H) 169.4 (H) 140.3 (H)    Impression and Plan:  58 year old woman with the following issues:  1. Peritoneal carcinomatosis with omental involvement. The pathology confirmed the  presence of adenocarcinoma of likely GYN etiology. Her CA 125 was elevated at 333.5 on 09/19/2012. The most recent CA 125 done on 02/25/2013 was 140.3. She tolerated the first 8 cycle of chemotherapy without any major complications. PET CT scan results on 02/25/2013  showed excellent response at this time. Unfortunately the patient's absolute neutrophil count is slightly low at 1.4 and her platelets are optimal at 73,000. We will reschedule cycle #9 to begin in one week he, giving her counts time to recover. Both patient and her husband are in agreement with this plan. We will plan to repeat cycle 9 and cycle 10 before another PET CT scan probably in end of March 2015 or early April 2015. Her [place] is about 1000 which is an acceptable cut off to treat her.  2. Neutropenia prophylaxis she will receive Neulasta after each cycle.  3. IV access: She had a Port-A-Cath inserted without  any complications and she has Emla cream as well.   4. Thrombocytopenia: The cuff to treat her will be 90,000. As her platelet count is below the cutoff of 90,000 currently 73,000 today and per Dr. Hazeline Junker preference chemotherapy will be delayed for one week.   5. Nausea prophylaxis: She will continue on Zofran.  6. Psychosocial support: Given her history of mental health disorder and given her options of counseling as well as social work services at this point she is a bit down today as she confided that her peer support therapist recently committed suicide. She is a bit depressed about this but was not in any acute psychological duress.  7. Genetic counseling: She does have a rather strong maternal family history of early breast cancer as well as a personal history of breast cancer now she is developing possibly another cancer including omental carcinomatosis Dr. Alen Blew thinks she would be of prime candidate for genetic counseling to identify a possible genetic disorder.  8. The followup: Cycle #9 will be postponed to next  week secondary to her thrombocytopenia and she will followup with Dr. Alen Blew in 4 weeks prior to the start of cycle #10     Carlton Adam, PA-C  2/26/20153:30 PM

## 2013-04-17 ENCOUNTER — Ambulatory Visit: Payer: BC Managed Care – PPO

## 2013-04-17 NOTE — Patient Instructions (Signed)
Her chemotherapy is being postponed one week due to your low platelet count Followup in one week for repeat labs and your cycle of chemotherapy Follow with Dr. Alen Blew in 4 weeks for another symptom management visit prior to the next cycle of chemotherapy

## 2013-04-21 ENCOUNTER — Institutional Professional Consult (permissible substitution): Payer: BC Managed Care – PPO | Admitting: Pulmonary Disease

## 2013-04-23 ENCOUNTER — Ambulatory Visit: Payer: BC Managed Care – PPO

## 2013-04-23 ENCOUNTER — Other Ambulatory Visit (HOSPITAL_BASED_OUTPATIENT_CLINIC_OR_DEPARTMENT_OTHER): Payer: BC Managed Care – PPO

## 2013-04-23 DIAGNOSIS — C786 Secondary malignant neoplasm of retroperitoneum and peritoneum: Secondary | ICD-10-CM

## 2013-04-23 DIAGNOSIS — C801 Malignant (primary) neoplasm, unspecified: Secondary | ICD-10-CM

## 2013-04-23 LAB — CBC WITH DIFFERENTIAL/PLATELET
BASO%: 0.4 % (ref 0.0–2.0)
Basophils Absolute: 0 10*3/uL (ref 0.0–0.1)
EOS%: 0.4 % (ref 0.0–7.0)
Eosinophils Absolute: 0 10*3/uL (ref 0.0–0.5)
HCT: 33.8 % — ABNORMAL LOW (ref 34.8–46.6)
HGB: 10.4 g/dL — ABNORMAL LOW (ref 11.6–15.9)
LYMPH%: 40.5 % (ref 14.0–49.7)
MCH: 31 pg (ref 25.1–34.0)
MCHC: 30.8 g/dL — ABNORMAL LOW (ref 31.5–36.0)
MCV: 100.6 fL (ref 79.5–101.0)
MONO#: 0.5 10*3/uL (ref 0.1–0.9)
MONO%: 18.2 % — AB (ref 0.0–14.0)
NEUT#: 1.1 10*3/uL — ABNORMAL LOW (ref 1.5–6.5)
NEUT%: 40.5 % (ref 38.4–76.8)
PLATELETS: 66 10*3/uL — AB (ref 145–400)
RBC: 3.36 10*6/uL — ABNORMAL LOW (ref 3.70–5.45)
RDW: 15.9 % — AB (ref 11.2–14.5)
WBC: 2.6 10*3/uL — ABNORMAL LOW (ref 3.9–10.3)
lymph#: 1.1 10*3/uL (ref 0.9–3.3)

## 2013-04-23 LAB — COMPREHENSIVE METABOLIC PANEL (CC13)
ALK PHOS: 65 U/L (ref 40–150)
ALT: 9 U/L (ref 0–55)
AST: 17 U/L (ref 5–34)
Albumin: 3.4 g/dL — ABNORMAL LOW (ref 3.5–5.0)
Anion Gap: 5 mEq/L (ref 3–11)
BUN: 13.6 mg/dL (ref 7.0–26.0)
CO2: 28 mEq/L (ref 22–29)
Calcium: 9.5 mg/dL (ref 8.4–10.4)
Chloride: 108 mEq/L (ref 98–109)
Creatinine: 0.7 mg/dL (ref 0.6–1.1)
GLUCOSE: 79 mg/dL (ref 70–140)
Potassium: 4.8 mEq/L (ref 3.5–5.1)
SODIUM: 141 meq/L (ref 136–145)
TOTAL PROTEIN: 7.8 g/dL (ref 6.4–8.3)
Total Bilirubin: 0.27 mg/dL (ref 0.20–1.20)

## 2013-04-23 NOTE — Progress Notes (Signed)
Hold treatment today per Dr. Alen Blew d/t lab results.  Pt notified in lobby; copy of lab work given to pt with next appt and pt verbalized understanding.

## 2013-04-24 ENCOUNTER — Ambulatory Visit: Payer: BC Managed Care – PPO

## 2013-04-26 ENCOUNTER — Encounter: Payer: Self-pay | Admitting: Oncology

## 2013-04-29 ENCOUNTER — Other Ambulatory Visit: Payer: Self-pay | Admitting: Oncology

## 2013-05-07 ENCOUNTER — Ambulatory Visit: Payer: BC Managed Care – PPO | Admitting: Oncology

## 2013-05-07 ENCOUNTER — Ambulatory Visit: Payer: BC Managed Care – PPO

## 2013-05-07 ENCOUNTER — Other Ambulatory Visit: Payer: BC Managed Care – PPO

## 2013-05-08 ENCOUNTER — Ambulatory Visit: Payer: BC Managed Care – PPO

## 2013-05-12 ENCOUNTER — Encounter: Payer: Self-pay | Admitting: Pulmonary Disease

## 2013-05-12 ENCOUNTER — Ambulatory Visit (INDEPENDENT_AMBULATORY_CARE_PROVIDER_SITE_OTHER): Payer: BC Managed Care – PPO | Admitting: Pulmonary Disease

## 2013-05-12 VITALS — BP 124/82 | HR 84 | Temp 97.9°F | Ht 62.0 in | Wt 162.8 lb

## 2013-05-12 DIAGNOSIS — G4733 Obstructive sleep apnea (adult) (pediatric): Secondary | ICD-10-CM

## 2013-05-12 NOTE — Progress Notes (Signed)
Subjective:    Patient ID: [name], female    DOB: 10/14/1955, 58 y.o.   MRN: [id number]  HPI The patient is a 58 year old female who comes in today to reestablish for obstructive sleep apnea. She was diagnosed in 2000 with mild OSA, with an AHI of 14 events per hour. She also was noted to have large numbers of limb movements, but very little sleep disruption. The patient was started on CPAP, and I last saw her in 2009. She believed the CPAP definitely helped her sleep and her daytime alertness, but she began to have breathing issues which were aggravated by the CPAP starting in August of last year. The patient has lost a significant amount of weight since being in cancer treatment, and her weight is down 25 pounds from the last time I saw her in 2009. She does snore without the CPAP device, and there is no one to monitor her breathing during sleep. She does awaken 3-4 times a night for unknown reasons, but feels adequately rested in the mornings upon arising. She does have inappropriate sleepiness during the day with inactivity, and also in the evening. She has been receiving chemotherapy for cancer, and this results in significant fatigue. She is having issues with her counts currently.   Sleep Questionnaire What time do you typically go to bed?( Between what hours) 1100p-1a 1100p-1a at 1420 on 05/12/13 by Virl Cagey, CMA How long does it take you to fall asleep? 3mins 67mins at 1420 on 05/12/13 by Virl Cagey, CMA How many times during the night do you wake up? 4 4 at 1420 on 05/12/13 by Virl Cagey, CMA What time do you get out of bed to start your day? No Value 7-9am at 1420 on 05/12/13 by Virl Cagey, CMA Do you drive or operate heavy machinery in your occupation? No No at 1420 on 05/12/13 by Virl Cagey, CMA How much has your weight changed (up or down) over the past two years? (In pounds) 70 lb (31.752 kg) 70 lb (31.752 kg) at 1420 on 05/12/13 by Virl Cagey, CMA Have you ever had a sleep study before? Yes Yes at 1420 on 05/12/13 by Virl Cagey, CMA If yes, location of study? [facility] at 1420 on 05/12/13 by Virl Cagey, CMA If yes, date of study? 2009 2009 at 1420 on 05/12/13 by Virl Cagey, CMA Do you currently use CPAP? NoNo owns CPAP--has not used in a while at 1420 on 05/12/13 by Virl Cagey, CMA Do you wear oxygen at any time? No No at 1420 on 05/12/13 by Virl Cagey, CMA   Review of Systems  Constitutional: Positive for appetite change and unexpected weight change. Negative for fever.  HENT: Positive for congestion. Negative for dental problem, ear pain, nosebleeds, postnasal drip, rhinorrhea, sinus pressure, sneezing, sore throat and trouble swallowing.   Eyes: Negative for redness and itching.  Respiratory: Positive for shortness of breath. Negative for cough, chest tightness and wheezing.   Cardiovascular: Negative for palpitations and leg swelling.  Gastrointestinal: Negative for nausea and vomiting.       Acids heartburn  Genitourinary: Negative for dysuria.  Musculoskeletal: Positive for arthralgias and joint swelling.  Skin: Negative for rash.  Neurological: Negative for headaches.  Hematological: Does not bruise/bleed easily.  Psychiatric/Behavioral: Positive for dysphoric mood. The patient is nervous/anxious.        Objective:   Physical Exam Constitutional:  Well developed, no acute  distress  HENT:  Nares patent without discharge  Oropharynx without exudate, palate and uvula are normal  Eyes:  Perrla, eomi, no scleral icterus  Neck:  No JVD, no TMG  Cardiovascular:  Normal rate, regular rhythm, no rubs or gallops.  No murmurs        Intact distal pulses  Pulmonary :  Normal breath sounds, no stridor or respiratory distress   No rales, rhonchi, or wheezing  Abdominal:  Soft, nondistended, bowel sounds present.  No tenderness noted.   Musculoskeletal:  No lower extremity edema  noted. +varicosities.  Lymph Nodes:  No cervical lymphadenopathy noted  Skin:  No cyanosis noted  Neurologic:  Alert, appropriate, moves all 4 extremities without obvious deficit.         Assessment & Plan:

## 2013-05-12 NOTE — Patient Instructions (Signed)
Will hold off on cpap for now, but please call me if you change your mind and we can restart.  Also, if you continue to have daytime alertness issues despite getting thru your chemo treatment, let me know and we can restart cpap.  Work on modest weight loss.

## 2013-05-12 NOTE — Assessment & Plan Note (Signed)
The patient has a history of mild obstructive sleep apnea, but has lost considerable weight since that time. It is unclear if she is been still has obstructive sleep apnea. She does snore, but feels rested in the mornings upon arising. She has definite inappropriate daytime sleepiness with periods of inactivity during the day and evening, but she is also undergoing chemotherapy for cancer which is resulting in significant fatigue. I have offered to restart CPAP to see if she sees significant improvement in her symptoms during the day, versus letting her get through her cancer treatment and then see how she feels. Because she has mild sleep apnea, this is not a significant health risk for her. After a discussion, the patient would like to get through her chemotherapy first, and if remains symptomatic, would consider going back on CPAP.

## 2013-05-14 ENCOUNTER — Encounter: Payer: Self-pay | Admitting: Oncology

## 2013-05-14 ENCOUNTER — Encounter: Payer: Self-pay | Admitting: Physician Assistant

## 2013-05-14 ENCOUNTER — Ambulatory Visit (HOSPITAL_BASED_OUTPATIENT_CLINIC_OR_DEPARTMENT_OTHER): Payer: BC Managed Care – PPO

## 2013-05-14 ENCOUNTER — Ambulatory Visit (HOSPITAL_BASED_OUTPATIENT_CLINIC_OR_DEPARTMENT_OTHER): Payer: BC Managed Care – PPO | Admitting: Physician Assistant

## 2013-05-14 ENCOUNTER — Other Ambulatory Visit (HOSPITAL_BASED_OUTPATIENT_CLINIC_OR_DEPARTMENT_OTHER): Payer: BC Managed Care – PPO

## 2013-05-14 VITALS — BP 135/56 | HR 63 | Temp 97.7°F | Resp 18 | Ht 62.0 in | Wt 162.4 lb

## 2013-05-14 DIAGNOSIS — Z853 Personal history of malignant neoplasm of breast: Secondary | ICD-10-CM

## 2013-05-14 DIAGNOSIS — Z5112 Encounter for antineoplastic immunotherapy: Secondary | ICD-10-CM

## 2013-05-14 DIAGNOSIS — C786 Secondary malignant neoplasm of retroperitoneum and peritoneum: Secondary | ICD-10-CM

## 2013-05-14 DIAGNOSIS — C801 Malignant (primary) neoplasm, unspecified: Secondary | ICD-10-CM

## 2013-05-14 DIAGNOSIS — R188 Other ascites: Secondary | ICD-10-CM

## 2013-05-14 DIAGNOSIS — Z5111 Encounter for antineoplastic chemotherapy: Secondary | ICD-10-CM

## 2013-05-14 DIAGNOSIS — C50919 Malignant neoplasm of unspecified site of unspecified female breast: Secondary | ICD-10-CM

## 2013-05-14 DIAGNOSIS — Z803 Family history of malignant neoplasm of breast: Secondary | ICD-10-CM

## 2013-05-14 DIAGNOSIS — D696 Thrombocytopenia, unspecified: Secondary | ICD-10-CM

## 2013-05-14 LAB — CBC WITH DIFFERENTIAL/PLATELET
BASO%: 0.3 % (ref 0.0–2.0)
Basophils Absolute: 0 10*3/uL (ref 0.0–0.1)
EOS ABS: 0.2 10*3/uL (ref 0.0–0.5)
EOS%: 4.2 % (ref 0.0–7.0)
HEMATOCRIT: 37 % (ref 34.8–46.6)
HGB: 11.6 g/dL (ref 11.6–15.9)
LYMPH%: 34.3 % (ref 14.0–49.7)
MCH: 30.4 pg (ref 25.1–34.0)
MCHC: 31.4 g/dL — AB (ref 31.5–36.0)
MCV: 96.9 fL (ref 79.5–101.0)
MONO#: 0.4 10*3/uL (ref 0.1–0.9)
MONO%: 10.6 % (ref 0.0–14.0)
NEUT%: 50.6 % (ref 38.4–76.8)
NEUTROS ABS: 1.9 10*3/uL (ref 1.5–6.5)
PLATELETS: 84 10*3/uL — AB (ref 145–400)
RBC: 3.82 10*6/uL (ref 3.70–5.45)
RDW: 13.9 % (ref 11.2–14.5)
WBC: 3.8 10*3/uL — ABNORMAL LOW (ref 3.9–10.3)
lymph#: 1.3 10*3/uL (ref 0.9–3.3)
nRBC: 0 % (ref 0–0)

## 2013-05-14 LAB — COMPREHENSIVE METABOLIC PANEL (CC13)
ALT: 14 U/L (ref 0–55)
AST: 21 U/L (ref 5–34)
Albumin: 3.6 g/dL (ref 3.5–5.0)
Alkaline Phosphatase: 67 U/L (ref 40–150)
Anion Gap: 10 mEq/L (ref 3–11)
BUN: 12.3 mg/dL (ref 7.0–26.0)
CALCIUM: 10.2 mg/dL (ref 8.4–10.4)
CHLORIDE: 107 meq/L (ref 98–109)
CO2: 24 mEq/L (ref 22–29)
CREATININE: 0.7 mg/dL (ref 0.6–1.1)
Glucose: 115 mg/dl (ref 70–140)
Potassium: 4.4 mEq/L (ref 3.5–5.1)
Sodium: 141 mEq/L (ref 136–145)
Total Bilirubin: 0.28 mg/dL (ref 0.20–1.20)
Total Protein: 8.4 g/dL — ABNORMAL HIGH (ref 6.4–8.3)

## 2013-05-14 LAB — UA PROTEIN, DIPSTICK - CHCC: PROTEIN: NEGATIVE mg/dL

## 2013-05-14 MED ORDER — ONDANSETRON 16 MG/50ML IVPB (CHCC)
16.0000 mg | Freq: Once | INTRAVENOUS | Status: AC
  Administered 2013-05-14: 16 mg via INTRAVENOUS

## 2013-05-14 MED ORDER — DEXAMETHASONE SODIUM PHOSPHATE 20 MG/5ML IJ SOLN
INTRAMUSCULAR | Status: AC
  Filled 2013-05-14: qty 5

## 2013-05-14 MED ORDER — SODIUM CHLORIDE 0.9 % IV SOLN
470.0000 mg | Freq: Once | INTRAVENOUS | Status: AC
  Administered 2013-05-14: 470 mg via INTRAVENOUS
  Filled 2013-05-14: qty 47

## 2013-05-14 MED ORDER — SODIUM CHLORIDE 0.9 % IV SOLN
60.0000 mg/m2 | Freq: Once | INTRAVENOUS | Status: AC
  Administered 2013-05-14: 110 mg via INTRAVENOUS
  Filled 2013-05-14: qty 11

## 2013-05-14 MED ORDER — ONDANSETRON 16 MG/50ML IVPB (CHCC)
INTRAVENOUS | Status: AC
  Filled 2013-05-14: qty 16

## 2013-05-14 MED ORDER — SODIUM CHLORIDE 0.9 % IV SOLN
Freq: Once | INTRAVENOUS | Status: AC
  Administered 2013-05-14: 12:00:00 via INTRAVENOUS

## 2013-05-14 MED ORDER — BEVACIZUMAB CHEMO INJECTION 400 MG/16ML
15.0000 mg/kg | Freq: Once | INTRAVENOUS | Status: AC
  Administered 2013-05-14: 1150 mg via INTRAVENOUS
  Filled 2013-05-14: qty 46

## 2013-05-14 MED ORDER — CARBOPLATIN CHEMO INTRADERMAL TEST DOSE 100MCG/0.02ML
100.0000 ug | Freq: Once | INTRADERMAL | Status: AC
  Administered 2013-05-14: 100 ug via INTRADERMAL
  Filled 2013-05-14: qty 0.01

## 2013-05-14 MED ORDER — HEPARIN SOD (PORK) LOCK FLUSH 100 UNIT/ML IV SOLN
500.0000 [IU] | Freq: Once | INTRAVENOUS | Status: AC | PRN
  Administered 2013-05-14: 500 [IU]
  Filled 2013-05-14: qty 5

## 2013-05-14 MED ORDER — DEXAMETHASONE SODIUM PHOSPHATE 20 MG/5ML IJ SOLN
20.0000 mg | Freq: Once | INTRAMUSCULAR | Status: AC
  Administered 2013-05-14: 20 mg via INTRAVENOUS

## 2013-05-14 MED ORDER — SODIUM CHLORIDE 0.9 % IJ SOLN
10.0000 mL | INTRAMUSCULAR | Status: DC | PRN
  Administered 2013-05-14: 10 mL
  Filled 2013-05-14: qty 10

## 2013-05-14 NOTE — Progress Notes (Signed)
Hematology and Oncology Follow Up Visit  [name] [id number] 04-04-1955 58 y.o. 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 This was diagnosed in July of 2014.   Prior Therapy:  She is status post paracentesis performed on 09/17/2012 with the cytology confirmed the presence of adenocarcinoma and immunohistochemical stains suggest GYN etiology. She is also status post lumpectomy for breast cancer diagnosed 25 years ago followed by radiation and chemotherapy under the care of Dr. Sonny Dandy. She did not receive any hormonal therapy.  Current therapy: Chemotherapy utilizing carboplatin and Taxotere started on 10/01/2012. Avastin was added with cycle 4. Status post 8 cycles. Of note patient's Avastin was held cycle 6 and cycle 7 secondary to some dental work. She is here for cycle 9.  Interim History: [name] is a pleasant woman presented with peritoneal carcinomatosis and ascites and the diagnosis mentioned above. Since her last visit, she tolerated cycle 8 of chemotherapy  with few complications. Today she feels well and her only complaint is some hoarseness in her voice. She states that the hoarseness has been present since her diagnosis. She feels it is worse when she is singing. She denied any pain with swallowing.  She notes loose stool but it depends on what she eats. She denied any specific pain. She denied nausea or vomiting, fever and chills.  She has reported also grade 1 neuropathy. Her appetite has improved and her weight is relatively stable.  She has not any shortness of breath or chest pain. She denied any GYN bleeding. She has not reported any recent complications or illnesses. She denied any evidence of infection.   Medications: I have reviewed the patient's current medications.  Current Outpatient  Prescriptions  Medication Sig Dispense Refill  . ALPRAZolam (XANAX) 0.25 MG tablet Take 0.25 mg by mouth. 1-2 by mouth at bedtime as needed, Rx'ed by Dr.Taylor      . clotrimazole-betamethasone (LOTRISONE) cream Apply 1 application topically 2 (two) times daily.  30 g  0  . diphenoxylate-atropine (LOMOTIL) 2.5-0.025 MG per tablet TAKE 1 TABLET BY MOUTH 4 TIMES A DAY AS NEEDED FOR DIARRHEA OR LOOSE STOOLS  30 tablet  0  . divalproex (DEPAKOTE ER) 500 MG 24 hr tablet Take 1,000 mg by mouth at bedtime.      . gabapentin (NEURONTIN) 100 MG capsule TAKE 1 CAPSULE BY MOUTH 3 TIMES DAILY.  90 capsule  0  . lamoTRIgine (LAMICTAL) 150 MG tablet Take 150 mg by mouth daily.      Marland Kitchen levothyroxine (SYNTHROID, LEVOTHROID) 100 MCG tablet Take 50-100 mcg by mouth daily before breakfast. Takes 1 tablet every day except Wednesday she takes 1/2 tablet      . lidocaine-prilocaine (EMLA) cream Apply topically as needed. Apply to port with every chemotherapy.  30 g  1  . ondansetron (ZOFRAN) 8 MG tablet Take 1 tablet (8 mg total) by mouth every 8 (eight) hours as needed for nausea.  20 tablet  0  . rOPINIRole (REQUIP) 1 MG tablet TAKE 1 TABLET BY MOUTH 3 HOURS BEFORE BEDTIME  30 tablet  2  . sodium chloride (OCEAN) 0.65 % SOLN nasal spray Place 1 spray into both nostrils as needed for congestion.      . Probiotic Product (ALIGN PO) Take 1 tablet by mouth daily.        No current facility-administered medications for  this visit.   Facility-Administered Medications Ordered in Other Visits  Medication Dose Route Frequency Provider Last Rate Last Dose  . bevacizumab (AVASTIN) 1,150 mg in sodium chloride 0.9 % 100 mL chemo infusion  15 mg/kg (Treatment Plan Actual) Intravenous Once Wyatt Portela, MD      . CARBOplatin (PARAPLATIN) 470 mg in sodium chloride 0.9 % 250 mL chemo infusion  470 mg Intravenous Once Vermelle Cammarata E Kavina Cantave, PA-C      . DOCEtaxel (TAXOTERE) 110 mg in sodium chloride 0.9 % 250 mL chemo infusion  60 mg/m2  (Treatment Plan Actual) Intravenous Once Wyatt Portela, MD      . heparin lock flush 100 unit/mL  500 Units Intracatheter Once PRN Wyatt Portela, MD      . sodium chloride 0.9 % injection 10 mL  10 mL Intracatheter PRN Wyatt Portela, MD         Allergies: No Known Allergies  Past Medical History, Surgical history, Social history, and Family History were reviewed and updated.  Review of Systems: Remaining ROS negative. Physical Exam: Blood pressure 135/56, pulse 63, temperature 97.7 F (36.5 C), temperature source Oral, resp. rate 18, height 5\' 2"  (1.575 m), weight 162 lb 6.4 oz (73.664 kg), SpO2 100.00%.  ECOG: 1  General appearance: alert, cooperative and appears stated age Head: Normocephalic, without obvious abnormality, atraumatic Neck: no adenopathy, no carotid bruit, no JVD, supple, symmetrical, trachea midline and thyroid not enlarged, symmetric, no tenderness/mass/nodules Lymph nodes: Cervical, supraclavicular, and axillary nodes normal. Heart:regular rate and rhythm, S1, S2 normal, no murmur, click, rub or gallop Lung:chest clear, no wheezing, rales, normal symmetric air entry Abdomin: soft, distended and shifting dullness noted.  it was not tender to deep palpation. Bowel sounds present in all quadrants EXT:no erythema, induration, or nodules   Lab Results: Lab Results  Component Value Date   WBC 3.8* 05/14/2013   HGB 11.6 05/14/2013   HCT 37.0 05/14/2013   MCV 96.9 05/14/2013   PLT 84* 05/14/2013     Chemistry      Component Value Date/Time   NA 141 05/14/2013 1026   NA 136 07/03/2012 1359   K 4.4 05/14/2013 1026   K 4.1 07/03/2012 1359   CL 103 07/03/2012 1359   CO2 24 05/14/2013 1026   CO2 30 07/03/2012 1359   BUN 12.3 05/14/2013 1026   BUN 9 07/03/2012 1359   CREATININE 0.7 05/14/2013 1026   CREATININE 0.7 07/03/2012 1359      Component Value Date/Time   CALCIUM 10.2 05/14/2013 1026   CALCIUM 9.6 07/03/2012 1359   ALKPHOS 67 05/14/2013 1026   ALKPHOS 80 07/03/2012  1359   AST 21 05/14/2013 1026   AST 42* 07/03/2012 1359   ALT 14 05/14/2013 1026   ALT 30 07/03/2012 1359   BILITOT 0.28 05/14/2013 1026   BILITOT 0.5 07/03/2012 1359      Results for [name], [name] (MRN [id number]) as of 03/26/2013 08:57  Ref. Range 12/17/2012 11:57 01/28/2013 09:57 02/25/2013 11:40  CA 125 Latest Range: 0.0-30.2 U/mL 334.0 (H) 169.4 (H) 140.3 (H)    Impression and Plan:  58 year old woman with the following issues:  1. Peritoneal carcinomatosis with omental involvement. The pathology confirmed the presence of adenocarcinoma of likely GYN etiology. Her CA 125 was elevated at 333.5 on 09/19/2012. The most recent CA 125 done on 02/25/2013 was 140.3. She tolerated the first 8 cycle of chemotherapy without any major complications. PET CT scan results on 02/25/2013  showed  excellent response at this time. Patient was reviewed with Dr. Alen Blew. Her platelet count is a bit improved at 84,000 and she denied having any active bleeding or bruising. She will proceed with cycle #9 of her systemic chemotherapy today as scheduled. We will plan for her to complete cycle #10 before PET CT scan probably in end of March 2015 or early April 2015. Her ANC is 1.9 today and is acceptable for treatment  2. Neutropenia prophylaxis she will receive Neulasta after each cycle.  3. IV access: She had a Port-A-Cath inserted without any complications and she has Emla cream as well.   4. Thrombocytopenia: The cuff to treat her will be 90,000. As stated above her platelet count of of 84,000 today was reviewed with Dr. Alen Blew and she is to proceed with treatment today as scheduled.   5. Nausea prophylaxis: She will continue on Zofran.  6. Psychosocial support: Given her history of mental health disorder and given her options of counseling as well as social work services at this point she is a bit down today as she confided that her peer support therapist recently committed suicide. She is a bit depressed about  this but was not in any acute psychological duress.  7. Genetic counseling: She does have a rather strong maternal family history of early breast cancer as well as a personal history of breast cancer now she is developing possibly another cancer including omental carcinomatosis Dr. Alen Blew thinks she would be of prime candidate for genetic counseling to identify a possible genetic disorder.  8. The followup: She will followup with Dr. Alen Blew in 3 weeks prior to the start of cycle #10     Carlton Adam, PA-C  3/26/20152:16 PM

## 2013-05-14 NOTE — Progress Notes (Signed)
Per Adrena PA, okay to tx with platelets-84

## 2013-05-14 NOTE — Patient Instructions (Addendum)
[facility] Discharge Instructions for Patients Receiving Chemotherapy  Today you received the following chemotherapy agents: Avastin, Taxotere, Carboplatin  To help prevent nausea and vomiting after your treatment, we encourage you to take your nausea medication: Zofran 8 mg every 8 hrs as needed.    If you develop nausea and vomiting that is not controlled by your nausea medication, call the clinic.   BELOW ARE SYMPTOMS THAT SHOULD BE REPORTED IMMEDIATELY:  *FEVER GREATER THAN 100.5 F  *CHILLS WITH OR WITHOUT FEVER  NAUSEA AND VOMITING THAT IS NOT CONTROLLED WITH YOUR NAUSEA MEDICATION  *UNUSUAL SHORTNESS OF BREATH  *UNUSUAL BRUISING OR BLEEDING  TENDERNESS IN MOUTH AND THROAT WITH OR WITHOUT PRESENCE OF ULCERS  *URINARY PROBLEMS  *BOWEL PROBLEMS  UNUSUAL RASH Items with * indicate a potential emergency and should be followed up as soon as possible.  Feel free to call the clinic you have any questions or concerns. The clinic phone number is [phone].

## 2013-05-15 ENCOUNTER — Telehealth: Payer: Self-pay | Admitting: Oncology

## 2013-05-15 ENCOUNTER — Ambulatory Visit (HOSPITAL_BASED_OUTPATIENT_CLINIC_OR_DEPARTMENT_OTHER): Payer: BC Managed Care – PPO

## 2013-05-15 VITALS — BP 121/77 | HR 72 | Temp 98.0°F

## 2013-05-15 DIAGNOSIS — C786 Secondary malignant neoplasm of retroperitoneum and peritoneum: Secondary | ICD-10-CM

## 2013-05-15 DIAGNOSIS — C801 Malignant (primary) neoplasm, unspecified: Secondary | ICD-10-CM

## 2013-05-15 DIAGNOSIS — C50919 Malignant neoplasm of unspecified site of unspecified female breast: Secondary | ICD-10-CM

## 2013-05-15 DIAGNOSIS — Z5189 Encounter for other specified aftercare: Secondary | ICD-10-CM

## 2013-05-15 LAB — CA 125: CA 125: 87.3 U/mL — AB (ref 0.0–30.2)

## 2013-05-15 MED ORDER — PEGFILGRASTIM INJECTION 6 MG/0.6ML
6.0000 mg | Freq: Once | SUBCUTANEOUS | Status: AC
  Administered 2013-05-15: 6 mg via SUBCUTANEOUS
  Filled 2013-05-15: qty 0.6

## 2013-05-15 NOTE — Patient Instructions (Signed)
Follow up in 3 weeks, prior to your next scheduled cycle of chemotherapy 

## 2013-05-15 NOTE — Telephone Encounter (Signed)
s/w pt husband re appts for 4/16 and 4/17. pt to get new schedule 4/16.

## 2013-05-19 ENCOUNTER — Encounter: Payer: Self-pay | Admitting: Oncology

## 2013-05-20 ENCOUNTER — Telehealth: Payer: Self-pay | Admitting: Medical Oncology

## 2013-05-20 NOTE — Telephone Encounter (Signed)
F/U call to patient's email regarding when PET scan is to take place. Per MD office notes, informed patient that after her 10th cycle of treatment scheduled 04/16 (lab/MD/tx) PET scan will be schedule by Dr. Alen Blew. Patient expressed understanding, knows to call office with further questions or concerns.

## 2013-05-28 ENCOUNTER — Other Ambulatory Visit: Payer: Self-pay | Admitting: Oncology

## 2013-05-31 ENCOUNTER — Encounter: Payer: Self-pay | Admitting: Oncology

## 2013-06-01 ENCOUNTER — Encounter: Payer: Self-pay | Admitting: Internal Medicine

## 2013-06-04 ENCOUNTER — Ambulatory Visit: Payer: BC Managed Care – PPO

## 2013-06-04 ENCOUNTER — Encounter: Payer: Self-pay | Admitting: Oncology

## 2013-06-04 ENCOUNTER — Ambulatory Visit (HOSPITAL_BASED_OUTPATIENT_CLINIC_OR_DEPARTMENT_OTHER): Payer: BC Managed Care – PPO | Admitting: Oncology

## 2013-06-04 ENCOUNTER — Telehealth: Payer: Self-pay | Admitting: *Deleted

## 2013-06-04 ENCOUNTER — Other Ambulatory Visit (HOSPITAL_BASED_OUTPATIENT_CLINIC_OR_DEPARTMENT_OTHER): Payer: BC Managed Care – PPO

## 2013-06-04 ENCOUNTER — Telehealth: Payer: Self-pay | Admitting: Oncology

## 2013-06-04 VITALS — BP 127/71 | HR 62 | Temp 97.4°F | Resp 18 | Ht 62.0 in | Wt 157.5 lb

## 2013-06-04 DIAGNOSIS — R5383 Other fatigue: Secondary | ICD-10-CM

## 2013-06-04 DIAGNOSIS — R5381 Other malaise: Secondary | ICD-10-CM

## 2013-06-04 DIAGNOSIS — C801 Malignant (primary) neoplasm, unspecified: Secondary | ICD-10-CM

## 2013-06-04 DIAGNOSIS — G589 Mononeuropathy, unspecified: Secondary | ICD-10-CM

## 2013-06-04 DIAGNOSIS — D696 Thrombocytopenia, unspecified: Secondary | ICD-10-CM

## 2013-06-04 DIAGNOSIS — C786 Secondary malignant neoplasm of retroperitoneum and peritoneum: Secondary | ICD-10-CM

## 2013-06-04 LAB — COMPREHENSIVE METABOLIC PANEL (CC13)
ALT: 11 U/L (ref 0–55)
AST: 21 U/L (ref 5–34)
Albumin: 3.7 g/dL (ref 3.5–5.0)
Alkaline Phosphatase: 67 U/L (ref 40–150)
Anion Gap: 8 mEq/L (ref 3–11)
BILIRUBIN TOTAL: 0.23 mg/dL (ref 0.20–1.20)
BUN: 21 mg/dL (ref 7.0–26.0)
CHLORIDE: 108 meq/L (ref 98–109)
CO2: 26 mEq/L (ref 22–29)
Calcium: 9.5 mg/dL (ref 8.4–10.4)
Creatinine: 0.7 mg/dL (ref 0.6–1.1)
Glucose: 93 mg/dl (ref 70–140)
Potassium: 4.4 mEq/L (ref 3.5–5.1)
SODIUM: 141 meq/L (ref 136–145)
TOTAL PROTEIN: 8.3 g/dL (ref 6.4–8.3)

## 2013-06-04 LAB — CBC WITH DIFFERENTIAL/PLATELET
BASO%: 0.7 % (ref 0.0–2.0)
Basophils Absolute: 0 10*3/uL (ref 0.0–0.1)
EOS%: 0.3 % (ref 0.0–7.0)
Eosinophils Absolute: 0 10*3/uL (ref 0.0–0.5)
HCT: 33.4 % — ABNORMAL LOW (ref 34.8–46.6)
HGB: 10.3 g/dL — ABNORMAL LOW (ref 11.6–15.9)
LYMPH#: 1 10*3/uL (ref 0.9–3.3)
LYMPH%: 33.8 % (ref 14.0–49.7)
MCH: 30.2 pg (ref 25.1–34.0)
MCHC: 30.8 g/dL — AB (ref 31.5–36.0)
MCV: 97.9 fL (ref 79.5–101.0)
MONO#: 0.5 10*3/uL (ref 0.1–0.9)
MONO%: 16.2 % — ABNORMAL HIGH (ref 0.0–14.0)
NEUT#: 1.5 10*3/uL (ref 1.5–6.5)
NEUT%: 49 % (ref 38.4–76.8)
Platelets: 73 10*3/uL — ABNORMAL LOW (ref 145–400)
RBC: 3.41 10*6/uL — AB (ref 3.70–5.45)
RDW: 14.1 % (ref 11.2–14.5)
WBC: 3 10*3/uL — ABNORMAL LOW (ref 3.9–10.3)

## 2013-06-04 LAB — UA PROTEIN, DIPSTICK - CHCC: Protein, ur: NEGATIVE mg/dL

## 2013-06-04 LAB — CA 125: CA 125: 67.9 U/mL — ABNORMAL HIGH (ref 0.0–30.2)

## 2013-06-04 NOTE — Progress Notes (Signed)
Hematology and Oncology Follow Up Visit  [name] [id number] [birth year] 58 y.o. 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 This was diagnosed in July of 2014.   Prior Therapy:  She is status post paracentesis performed on 09/17/2012 with the cytology confirmed the presence of adenocarcinoma and immunohistochemical stains suggest GYN etiology. She is also status post lumpectomy for breast cancer diagnosed 25 years ago followed by radiation and chemotherapy under the care of Dr. Sonny Dandy. She did not receive any hormonal therapy.  Current therapy: Chemotherapy utilizing carboplatin and Taxotere started on 10/01/2012. Avastin was added with cycle 4. Status post 8 cycles. Of note patient's Avastin was held cycle 6 and cycle 7 secondary to some dental work. She is here for cycle 10.  Interim History: [name] is a pleasant woman presented with peritoneal carcinomatosis and ascites and the diagnosis mentioned above. Since her last visit, she tolerated cycle 9 of chemotherapy  with few complications. Today she feels extremely fatigued in the last few days. She is not reporting any fevers or chills. Has not reported any shortness of breath or cough. She denied any pain with swallowing.  She notes loose stool but it depends on what she eats. She denied any specific pain. She denied nausea or vomiting, fever and chills.  She has reported also grade 1 neuropathy. Her appetite has improved and her weight is relatively stable.  She has not any shortness of breath or chest pain. She denied any GYN bleeding. She has not reported any recent complications or illnesses. She has not reported any increase in her abdominal pain or early satiety. She is currently grieving the loss of her therapist but she is talking to be grief  counselor at this time and feels that she is coping well.  Medications: I have reviewed the patient's current medications.  Current Outpatient Prescriptions  Medication Sig Dispense Refill  . ALPRAZolam (XANAX) 0.25 MG tablet Take 0.25 mg by mouth. 1-2 by mouth at bedtime as needed, Rx'ed by Dr.Taylor      . clotrimazole-betamethasone (LOTRISONE) cream Apply 1 application topically 2 (two) times daily.  30 g  0  . diphenoxylate-atropine (LOMOTIL) 2.5-0.025 MG per tablet TAKE 1 TABLET BY MOUTH 4 TIMES A DAY AS NEEDED FOR DIARRHEA OR LOOSE STOOLS  30 tablet  0  . divalproex (DEPAKOTE ER) 500 MG 24 hr tablet Take 1,000 mg by mouth at bedtime.      . gabapentin (NEURONTIN) 100 MG capsule TAKE 1 CAPSULE BY MOUTH 3 TIMES DAILY.  90 capsule  0  . lamoTRIgine (LAMICTAL) 150 MG tablet Take 150 mg by mouth daily.      Marland Kitchen levothyroxine (SYNTHROID, LEVOTHROID) 100 MCG tablet Take 50-100 mcg by mouth daily before breakfast. Takes 1 tablet every day except Wednesday she takes 1/2 tablet      . lidocaine-prilocaine (EMLA) cream Apply topically as needed. Apply to port with every chemotherapy.  30 g  1  . ondansetron (ZOFRAN) 8 MG tablet Take 1 tablet (8 mg total) by mouth every 8 (eight) hours as needed for nausea.  20 tablet  0  . Probiotic Product (ALIGN PO) Take 1 tablet by mouth daily.       Marland Kitchen rOPINIRole (REQUIP) 1 MG tablet TAKE 1 TABLET BY MOUTH 3 HOURS BEFORE BEDTIME  30 tablet  2  . sodium chloride (OCEAN)  0.65 % SOLN nasal spray Place 1 spray into both nostrils as needed for congestion.       No current facility-administered medications for this visit.     Allergies: No Known Allergies  Past Medical History, Surgical history, Social history, and Family History were reviewed and updated.  Review of Systems: Remaining ROS negative. Physical Exam: Blood pressure 127/71, pulse 62, temperature 97.4 F (36.3 C), temperature source Oral, resp. rate 18, height 5\' 2"  (1.575 m), weight 157 lb 8 oz  (71.442 kg), SpO2 100.00%.  ECOG: 1  General appearance: alert, cooperative and appears stated age did not appear in any distress. Her mood appeared stable. Head: Normocephalic, without obvious abnormality, atraumatic Neck: no adenopathy, no carotid bruit, no JVD, supple, symmetrical, trachea midline and thyroid not enlarged, symmetric, no tenderness/mass/nodules Lymph nodes: Cervical, supraclavicular, and axillary nodes normal. Heart:regular rate and rhythm, S1, S2 normal, no murmur, click, rub or gallop Lung:chest clear, no wheezing, rales, normal symmetric air entry Abdomin: soft, distended and shifting dullness noted.  it was not tender to deep palpation. Bowel sounds present in all quadrants EXT:no erythema, induration, or nodules   Lab Results: Lab Results  Component Value Date   WBC 3.0* 06/04/2013   HGB 10.3* 06/04/2013   HCT 33.4* 06/04/2013   MCV 97.9 06/04/2013   PLT 73* 06/04/2013     Chemistry      Component Value Date/Time   NA 141 06/04/2013 0936   NA 136 07/03/2012 1359   K 4.4 06/04/2013 0936   K 4.1 07/03/2012 1359   CL 103 07/03/2012 1359   CO2 26 06/04/2013 0936   CO2 30 07/03/2012 1359   BUN 21.0 06/04/2013 0936   BUN 9 07/03/2012 1359   CREATININE 0.7 06/04/2013 0936   CREATININE 0.7 07/03/2012 1359      Component Value Date/Time   CALCIUM 9.5 06/04/2013 0936   CALCIUM 9.6 07/03/2012 1359   ALKPHOS 67 06/04/2013 0936   ALKPHOS 80 07/03/2012 1359   AST 21 06/04/2013 0936   AST 42* 07/03/2012 1359   ALT 11 06/04/2013 0936   ALT 30 07/03/2012 1359   BILITOT 0.23 06/04/2013 0936   BILITOT 0.5 07/03/2012 1359       Results for CAERA, ENWRIGHT (MRN [id number]) as of 06/04/2013 09:55  Ref. Range 03/26/2013 08:49 05/14/2013 10:25  CA 125 Latest Range: 0.0-30.2 U/mL 109.0 (H) 87.3 (H)    Impression and Plan:  58 year old woman with the following issues:  1. Peritoneal carcinomatosis with omental involvement. The pathology confirmed the presence of adenocarcinoma of likely  GYN etiology. Her CA 125 was elevated at 333.5 on 09/19/2012. The most recent CA 125 continues to decline down to 87.3.  She tolerated  chemotherapy without any major complications. PET CT scan results on 02/25/2013  showed excellent response at this time.  I plan to repeat her PET/CT scan before the next visit. I will hold systemic chemotherapy for today's visit for multiple reasons. A lot of it is wishes given her recent grieving issue as well as increased fatigue. She also does have thrombocytopenia and a lot of at our reasons to hold systemic chemotherapy for 3 weeks.  2. Neutropenia prophylaxis she will receive Neulasta after each cycle.  3. IV access: She had a Port-A-Cath inserted without any complications and she has Emla cream as well.   4. Thrombocytopenia: The cuff to treat her will be 90,000.   5. Nausea prophylaxis: She will continue on Zofran.  6. Psychosocial support: I  continue to offer her access to all resources including social worker evaluation as well as Chief of Staff. At this time, she has her on therapist and we've counseled her that she is using and appears to be stable at this time.  7. Genetic counseling: She does have a rather strong maternal family history of early breast cancer as well as a personal history of breast cancer now she is developing possibly another cancer including omental carcinomatosis Dr. Alen Blew thinks she would be of prime candidate for genetic counseling to identify a possible genetic disorder.  8. The followup: She will followup in 3 weeks for her next cycle of chemotherapy after a PET scan.    Wyatt Portela, MD 4/16/201510:16 AM

## 2013-06-04 NOTE — Telephone Encounter (Signed)
Gave pt appt for lab,md and chemo for May 2015 °

## 2013-06-04 NOTE — Telephone Encounter (Signed)
Per staff message and POF I have scheduled appts.  JMW  

## 2013-06-04 NOTE — Telephone Encounter (Signed)
Collaborative nurse reports patient was given signed copy of the 3-36-2015 letter at today's f/u visit.

## 2013-06-05 ENCOUNTER — Ambulatory Visit: Payer: BC Managed Care – PPO

## 2013-06-08 ENCOUNTER — Telehealth: Payer: Self-pay | Admitting: Internal Medicine

## 2013-06-08 NOTE — Telephone Encounter (Signed)
I understand. She is immunosuppressed now from chemotherapy.

## 2013-06-08 NOTE — Telephone Encounter (Signed)
Patient is currently on chemo for peritoneal cancer. She received a recall letter but wants to wait another year d/t current issues.

## 2013-06-10 ENCOUNTER — Encounter: Payer: Self-pay | Admitting: Oncology

## 2013-06-10 ENCOUNTER — Other Ambulatory Visit: Payer: Self-pay | Admitting: *Deleted

## 2013-06-10 DIAGNOSIS — C786 Secondary malignant neoplasm of retroperitoneum and peritoneum: Secondary | ICD-10-CM

## 2013-06-10 DIAGNOSIS — C50919 Malignant neoplasm of unspecified site of unspecified female breast: Secondary | ICD-10-CM

## 2013-06-10 MED ORDER — UNABLE TO FIND: 1.0000 [IU] | Status: DC | PRN

## 2013-06-17 ENCOUNTER — Encounter: Payer: Self-pay | Admitting: Oncology

## 2013-06-19 ENCOUNTER — Telehealth: Payer: Self-pay | Admitting: Medical Oncology

## 2013-06-19 ENCOUNTER — Encounter: Payer: Self-pay | Admitting: Oncology

## 2013-06-19 NOTE — Telephone Encounter (Signed)
Follow up to pt's email. LVMOM with patient clarifying dates and times of appts. Patient to return call to office with further questions.

## 2013-06-23 ENCOUNTER — Telehealth: Payer: Self-pay | Admitting: Oncology

## 2013-06-23 ENCOUNTER — Other Ambulatory Visit: Payer: BC Managed Care – PPO

## 2013-06-23 ENCOUNTER — Ambulatory Visit (HOSPITAL_COMMUNITY)
Admission: RE | Admit: 2013-06-23 | Discharge: 2013-06-23 | Disposition: A | Discharge status: Home / Self Care | Payer: BC Managed Care – PPO | Source: Ambulatory Visit | Attending: Oncology | Admitting: Oncology

## 2013-06-23 DIAGNOSIS — R188 Other ascites: Secondary | ICD-10-CM | POA: Insufficient documentation

## 2013-06-23 DIAGNOSIS — K669 Disorder of peritoneum, unspecified: Secondary | ICD-10-CM | POA: Insufficient documentation

## 2013-06-23 DIAGNOSIS — C786 Secondary malignant neoplasm of retroperitoneum and peritoneum: Secondary | ICD-10-CM

## 2013-06-23 DIAGNOSIS — C569 Malignant neoplasm of unspecified ovary: Secondary | ICD-10-CM | POA: Insufficient documentation

## 2013-06-23 LAB — GLUCOSE, CAPILLARY: Glucose-Capillary: 91 mg/dL (ref 70–99)

## 2013-06-23 MED ORDER — FLUDEOXYGLUCOSE F - 18 (FDG) INJECTION
8.5000 | Freq: Once | INTRAVENOUS | Status: AC | PRN
  Administered 2013-06-23: 8.5 via INTRAVENOUS

## 2013-06-23 NOTE — Telephone Encounter (Signed)
, °

## 2013-06-24 ENCOUNTER — Telehealth: Payer: Self-pay | Admitting: Medical Oncology

## 2013-06-24 ENCOUNTER — Telehealth: Payer: Self-pay | Admitting: Oncology

## 2013-06-24 NOTE — Telephone Encounter (Signed)
Patient called stating forgot lab appt schedule on 05/05. Informed patient will r/s labs for tomorrow 05/07 prior to her appt with Dr. Alen Blew.  POF sent, scheduling to inform patient of time.

## 2013-06-24 NOTE — Telephone Encounter (Signed)
, °

## 2013-06-25 ENCOUNTER — Ambulatory Visit (HOSPITAL_BASED_OUTPATIENT_CLINIC_OR_DEPARTMENT_OTHER): Payer: BC Managed Care – PPO | Admitting: Oncology

## 2013-06-25 ENCOUNTER — Ambulatory Visit (HOSPITAL_BASED_OUTPATIENT_CLINIC_OR_DEPARTMENT_OTHER): Payer: BC Managed Care – PPO

## 2013-06-25 ENCOUNTER — Telehealth: Payer: Self-pay | Admitting: Oncology

## 2013-06-25 ENCOUNTER — Other Ambulatory Visit (HOSPITAL_BASED_OUTPATIENT_CLINIC_OR_DEPARTMENT_OTHER): Payer: BC Managed Care – PPO

## 2013-06-25 ENCOUNTER — Other Ambulatory Visit: Payer: BC Managed Care – PPO

## 2013-06-25 ENCOUNTER — Encounter: Payer: Self-pay | Admitting: Medical Oncology

## 2013-06-25 ENCOUNTER — Encounter: Payer: Self-pay | Admitting: Oncology

## 2013-06-25 VITALS — BP 129/74 | HR 64 | Temp 97.7°F | Resp 18 | Ht 62.0 in | Wt 159.1 lb

## 2013-06-25 DIAGNOSIS — C801 Malignant (primary) neoplasm, unspecified: Secondary | ICD-10-CM

## 2013-06-25 DIAGNOSIS — C786 Secondary malignant neoplasm of retroperitoneum and peritoneum: Secondary | ICD-10-CM

## 2013-06-25 DIAGNOSIS — D696 Thrombocytopenia, unspecified: Secondary | ICD-10-CM

## 2013-06-25 DIAGNOSIS — R180 Malignant ascites: Secondary | ICD-10-CM

## 2013-06-25 DIAGNOSIS — Z5111 Encounter for antineoplastic chemotherapy: Secondary | ICD-10-CM

## 2013-06-25 DIAGNOSIS — Z5112 Encounter for antineoplastic immunotherapy: Secondary | ICD-10-CM

## 2013-06-25 DIAGNOSIS — C50919 Malignant neoplasm of unspecified site of unspecified female breast: Secondary | ICD-10-CM

## 2013-06-25 LAB — CBC WITH DIFFERENTIAL/PLATELET
BASO%: 0.3 % (ref 0.0–2.0)
Basophils Absolute: 0 10*3/uL (ref 0.0–0.1)
EOS ABS: 0.1 10*3/uL (ref 0.0–0.5)
EOS%: 2.9 % (ref 0.0–7.0)
HEMATOCRIT: 35.8 % (ref 34.8–46.6)
HEMOGLOBIN: 11 g/dL — AB (ref 11.6–15.9)
LYMPH%: 40.1 % (ref 14.0–49.7)
MCH: 29.7 pg (ref 25.1–34.0)
MCHC: 30.7 g/dL — AB (ref 31.5–36.0)
MCV: 96.8 fL (ref 79.5–101.0)
MONO#: 0.5 10*3/uL (ref 0.1–0.9)
MONO%: 13.3 % (ref 0.0–14.0)
NEUT#: 1.6 10*3/uL (ref 1.5–6.5)
NEUT%: 43.4 % (ref 38.4–76.8)
PLATELETS: 72 10*3/uL — AB (ref 145–400)
RBC: 3.7 10*6/uL (ref 3.70–5.45)
RDW: 13.7 % (ref 11.2–14.5)
WBC: 3.8 10*3/uL — AB (ref 3.9–10.3)
lymph#: 1.5 10*3/uL (ref 0.9–3.3)

## 2013-06-25 LAB — COMPREHENSIVE METABOLIC PANEL (CC13)
ALBUMIN: 3.4 g/dL — AB (ref 3.5–5.0)
ALT: 7 U/L (ref 0–55)
ANION GAP: 11 meq/L (ref 3–11)
AST: 16 U/L (ref 5–34)
Alkaline Phosphatase: 69 U/L (ref 40–150)
BUN: 15.6 mg/dL (ref 7.0–26.0)
CALCIUM: 9.5 mg/dL (ref 8.4–10.4)
CHLORIDE: 106 meq/L (ref 98–109)
CO2: 26 mEq/L (ref 22–29)
CREATININE: 0.8 mg/dL (ref 0.6–1.1)
Glucose: 78 mg/dl (ref 70–140)
POTASSIUM: 4.8 meq/L (ref 3.5–5.1)
Sodium: 143 mEq/L (ref 136–145)
Total Bilirubin: 0.22 mg/dL (ref 0.20–1.20)
Total Protein: 7.9 g/dL (ref 6.4–8.3)

## 2013-06-25 MED ORDER — DEXAMETHASONE SODIUM PHOSPHATE 20 MG/5ML IJ SOLN
20.0000 mg | Freq: Once | INTRAMUSCULAR | Status: AC
Start: 2013-06-25 — End: 2013-06-25
  Administered 2013-06-25: 20 mg via INTRAVENOUS

## 2013-06-25 MED ORDER — HEPARIN SOD (PORK) LOCK FLUSH 100 UNIT/ML IV SOLN
500.0000 [IU] | Freq: Once | INTRAVENOUS | Status: AC | PRN
  Administered 2013-06-25: 500 [IU]
  Filled 2013-06-25: qty 5

## 2013-06-25 MED ORDER — SODIUM CHLORIDE 0.9 % IV SOLN
15.0000 mg/kg | Freq: Once | INTRAVENOUS | Status: AC
  Administered 2013-06-25: 1150 mg via INTRAVENOUS
  Filled 2013-06-25: qty 46

## 2013-06-25 MED ORDER — DOCETAXEL CHEMO INJECTION 160 MG/16ML
60.0000 mg/m2 | Freq: Once | INTRAVENOUS | Status: AC
  Administered 2013-06-25: 110 mg via INTRAVENOUS
  Filled 2013-06-25: qty 11

## 2013-06-25 MED ORDER — SODIUM CHLORIDE 0.9 % IV SOLN
Freq: Once | INTRAVENOUS | Status: AC
  Administered 2013-06-25: 14:00:00 via INTRAVENOUS

## 2013-06-25 MED ORDER — SODIUM CHLORIDE 0.9 % IJ SOLN
10.0000 mL | INTRAMUSCULAR | Status: DC | PRN
  Administered 2013-06-25: 10 mL
  Filled 2013-06-25: qty 10

## 2013-06-25 MED ORDER — CARBOPLATIN CHEMO INTRADERMAL TEST DOSE 100MCG/0.02ML
100.0000 ug | Freq: Once | INTRADERMAL | Status: AC
  Administered 2013-06-25: 100 ug via INTRADERMAL
  Filled 2013-06-25: qty 0.01

## 2013-06-25 MED ORDER — ONDANSETRON 16 MG/50ML IVPB (CHCC)
16.0000 mg | Freq: Once | INTRAVENOUS | Status: AC
  Administered 2013-06-25: 16 mg via INTRAVENOUS

## 2013-06-25 MED ORDER — SODIUM CHLORIDE 0.9 % IV SOLN
480.0000 mg | Freq: Once | INTRAVENOUS | Status: AC
  Administered 2013-06-25: 480 mg via INTRAVENOUS
  Filled 2013-06-25: qty 48

## 2013-06-25 MED ORDER — DEXAMETHASONE SODIUM PHOSPHATE 20 MG/5ML IJ SOLN
INTRAMUSCULAR | Status: AC
  Filled 2013-06-25: qty 5

## 2013-06-25 MED ORDER — ONDANSETRON 16 MG/50ML IVPB (CHCC)
INTRAVENOUS | Status: AC
  Filled 2013-06-25: qty 16

## 2013-06-25 NOTE — Progress Notes (Signed)
Carbo test dose: 1344 - neg 1359 - neg 1414 - neg

## 2013-06-25 NOTE — Progress Notes (Signed)
Hematology and Oncology Follow Up Visit  [name] [id number] 1955-03-07 58 y.o. 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 This was diagnosed in July of 2014.   Prior Therapy:  She is status post paracentesis performed on 09/17/2012 with the cytology confirmed the presence of adenocarcinoma and immunohistochemical stains suggest GYN etiology. She is also status post lumpectomy for breast cancer diagnosed 25 years ago followed by radiation and chemotherapy under the care of Dr. Sonny Padilla. She did not receive any hormonal therapy.  Current therapy: Chemotherapy utilizing carboplatin and Taxotere started on 10/01/2012. Avastin was added with cycle 4. He is here for the next cycle of chemotherapy.  Interim History: [name] is a pleasant woman presented with peritoneal carcinomatosis and ascites and the diagnosis mentioned above. Since her last visit, chemotherapy was held and have felt better since the last visit. Her fatigue is much improved. She is not reporting any fevers or chills. Has not reported any shortness of breath or cough. She denied any pain with swallowing.  She notes loose stool but it depends on what she eats. She denied any specific pain. She denied nausea or vomiting, fever and chills.  She has reported also grade 1 neuropathy. Her appetite has improved and her weight is relatively stable.  She has not any shortness of breath or chest pain. She denied any GYN bleeding. She has not reported any recent complications or illnesses. She has not reported any increase in her abdominal pain or early satiety. Her mood is much improved as well since last time.  Medications: I have reviewed the patient's current medications.  Current Outpatient Prescriptions  Medication Sig Dispense Refill  . sertraline  (ZOLOFT) 100 MG tablet Take 100 mg by mouth daily. Take 1/2 tablet every morning for 6-8 days then increase to 1 tablet every morning.      Marland Kitchen ALPRAZolam (XANAX) 0.25 MG tablet Take 0.25 mg by mouth. 1-2 by mouth at bedtime as needed, Rx'ed by Dr.Taylor      . clotrimazole-betamethasone (LOTRISONE) cream Apply 1 application topically 2 (two) times daily.  30 g  0  . diphenoxylate-atropine (LOMOTIL) 2.5-0.025 MG per tablet TAKE 1 TABLET BY MOUTH 4 TIMES A DAY AS NEEDED FOR DIARRHEA OR LOOSE STOOLS  30 tablet  0  . divalproex (DEPAKOTE ER) 500 MG 24 hr tablet Take 1,000 mg by mouth at bedtime.      . gabapentin (NEURONTIN) 100 MG capsule TAKE 1 CAPSULE BY MOUTH 3 TIMES DAILY.  90 capsule  0  . lamoTRIgine (LAMICTAL) 150 MG tablet Take 150 mg by mouth daily.      Marland Kitchen levothyroxine (SYNTHROID, LEVOTHROID) 100 MCG tablet Take 50-100 mcg by mouth daily before breakfast. Takes 1 tablet every day except Wednesday she takes 1/2 tablet      . lidocaine-prilocaine (EMLA) cream Apply topically as needed. Apply to port with every chemotherapy.  30 g  1  . ondansetron (ZOFRAN) 8 MG tablet Take 1 tablet (8 mg total) by mouth every 8 (eight) hours as needed for nausea.  20 tablet  0  . Probiotic Product (ALIGN PO) Take 1 tablet by mouth daily.       Marland Kitchen rOPINIRole (REQUIP) 1 MG tablet TAKE 1 TABLET BY MOUTH 3 HOURS BEFORE BEDTIME  30 tablet  2  . sodium chloride (OCEAN) 0.65 % SOLN nasal spray Place  1 spray into both nostrils as needed for congestion.      Marland Kitchen UNABLE TO FIND 1 Units by Other route as needed. Cranial  Prosthesis  For  Alopecia  Due to  Chemotherapy.  1 Units  1   No current facility-administered medications for this visit.     Allergies: No Known Allergies  Past Medical History, Surgical history, Social history, and Family History were reviewed and updated.  Review of Systems: Remaining ROS negative. Physical Exam: Blood pressure 129/74, pulse 64, temperature 97.7 F (36.5 C), temperature source  Oral, resp. rate 18, height 5\' 2"  (1.575 m), weight 159 lb 1.6 oz (72.167 kg).  ECOG: 1  General appearance: alert, cooperative and appears stated age did not appear in any distress. Her mood appeared stable. Head: Normocephalic, without obvious abnormality, atraumatic Neck: no adenopathy, no carotid bruit, no JVD, supple, symmetrical, trachea midline and thyroid not enlarged, symmetric, no tenderness/mass/nodules Lymph nodes: Cervical, supraclavicular, and axillary nodes normal. Heart:regular rate and rhythm, S1, S2 normal, no murmur, click, rub or gallop Lung:chest clear, no wheezing, rales, normal symmetric air entry Abdomin: soft, distended and shifting dullness noted.  it was not tender to deep palpation. Bowel sounds present in all quadrants EXT:no erythema, induration, or nodules   Lab Results: Lab Results  Component Value Date   WBC 3.8* 06/25/2013   HGB 11.0* 06/25/2013   HCT 35.8 06/25/2013   MCV 96.8 06/25/2013   PLT 72* 06/25/2013     Chemistry      Component Value Date/Time   NA 143 06/25/2013 1123   NA 136 07/03/2012 1359   K 4.8 06/25/2013 1123   K 4.1 07/03/2012 1359   CL 103 07/03/2012 1359   CO2 26 06/25/2013 1123   CO2 30 07/03/2012 1359   BUN 15.6 06/25/2013 1123   BUN 9 07/03/2012 1359   CREATININE 0.8 06/25/2013 1123   CREATININE 0.7 07/03/2012 1359      Component Value Date/Time   CALCIUM 9.5 06/25/2013 1123   CALCIUM 9.6 07/03/2012 1359   ALKPHOS 69 06/25/2013 1123   ALKPHOS 80 07/03/2012 1359   AST 16 06/25/2013 1123   AST 42* 07/03/2012 1359   ALT 7 06/25/2013 1123   ALT 30 07/03/2012 1359   BILITOT 0.22 06/25/2013 1123   BILITOT 0.5 07/03/2012 1359      Results for SUMER, [name] (MRN [id number]) as of 06/25/2013 11:38  Ref. Range 02/25/2013 11:40 03/26/2013 08:49 05/14/2013 10:25 06/04/2013 09:36  CA 125 Latest Range: 0.0-30.2 U/mL 140.3 (H) 109.0 (H) 87.3 (H) 67.9 (H)    EXAM:  NUCLEAR MEDICINE PET SKULL BASE TO THIGH  TECHNIQUE:  8.5 mCi F-18 FDG was injected intravenously.  Full-ring PET imaging  was performed from the skull base to thigh after the radiotracer. CT  data was obtained and used for attenuation correction and anatomic  localization.  FASTING BLOOD GLUCOSE: Value: 91 mg/dl  COMPARISON: 02/25/2013  FINDINGS:  NECK  No hypermetabolic lymph nodes in the neck.  CHEST  No hypermetabolic mediastinal or hilar nodes. No suspicious  pulmonary nodules on the CT scan. Status post right mastectomy.  There is a right chest wall port a catheter with tip in the  cavoatrial junction. A right axillary nodal dissection has been  performed.  ABDOMEN/PELVIS  No abnormal hypermetabolic activity within the liver, pancreas,  adrenal glands, or spleen. Again noted is evidence of peritoneal  metastases. The index lesion along the inferior margin of the right  hepatic lobe measures 6.3  x 4.0 cm and has an SUV max equal to 4.7.  Previously this measured 6.5 x 3.6 cm and had an SUV max equal to  the same. The second index lesion along the undersurface of the  ventral abdominal wall measure 2.5 x 9.7 cm and has an SUV max equal  to 5. Previously this measured 2.9 x 9.7 cm and had an SUV max equal  to 4.3. Small amount of ascites is again noted along the anterior  surface of the liver and dependent portion of the pelvis. Other  areas of peritoneal disease are grossly unchanged. No new or  progressive disease.  SKELETON  No focal hypermetabolic activity to suggest skeletal metastasis.  IMPRESSION:  1. Stable appearance of peritoneal disease within the abdomen and  pelvis.  2. No change in small volume abdominopelvic ascites.    Impression and Plan:  58 year old woman with the following issues:  1. Peritoneal carcinomatosis with omental involvement. The pathology confirmed the presence of adenocarcinoma of likely GYN etiology. Her CA 125 was elevated at 333.5 on 09/19/2012. She has been on systemic chemotherapy with carboplatin and Taxotere since August of 2014. She  continues to have an excellent tumor marker response with a drop down to 67. Her PET CT scan was also discussed today and showed stable disease. I plan to treat her today with the combination chemotherapy with a Avastin and plan to do the same thing on 07/16/2013 if she is fit and willing. I will likely use maintenance Avastin in the future probably starting in June of 2015.  2. Neutropenia prophylaxis she will receive Neulasta after each cycle.  3. IV access: She had a Port-A-Cath inserted without any complications and she has Emla cream as well.   4. Thrombocytopenia: The cuff to treat her will be 70,000.   5. Nausea prophylaxis: She will continue on Zofran.  6. Psychosocial support: I continue to offer her access to all resources including social worker evaluation as well as psychologist evaluation. At this time, she has her on therapist and we've counseled her that she is using and appears to be stable at this time.  7. Genetic counseling: She does have a rather strong maternal family history of early breast cancer as well as a personal history of breast cancer now she is developing possibly another cancer including omental carcinomatosis Dr. Alen Blew thinks she would be of prime candidate for genetic counseling to identify a possible genetic disorder.  8. The followup: She will followup in 3 weeks for her next cycle of chemotherapy and M.D. visit in 6 weeks.    Wyatt Portela, MD 5/7/201512:00 PM

## 2013-06-25 NOTE — Patient Instructions (Signed)
[facility] Discharge Instructions for Patients Receiving Chemotherapy  Today you received the following chemotherapy agents avastin, taxotere and carboplatin. To help prevent nausea and vomiting after your treatment, we encourage you to take your nausea medication zofran.   If you develop nausea and vomiting that is not controlled by your nausea medication, call the clinic.   BELOW ARE SYMPTOMS THAT SHOULD BE REPORTED IMMEDIATELY:  *FEVER GREATER THAN 100.5 F  *CHILLS WITH OR WITHOUT FEVER  NAUSEA AND VOMITING THAT IS NOT CONTROLLED WITH YOUR NAUSEA MEDICATION  *UNUSUAL SHORTNESS OF BREATH  *UNUSUAL BRUISING OR BLEEDING  TENDERNESS IN MOUTH AND THROAT WITH OR WITHOUT PRESENCE OF ULCERS  *URINARY PROBLEMS  *BOWEL PROBLEMS  UNUSUAL RASH Items with * indicate a potential emergency and should be followed up as soon as possible.  Feel free to call the clinic you have any questions or concerns. The clinic phone number is [phone].

## 2013-06-25 NOTE — Telephone Encounter (Signed)
gv adn printed appt sched and avs for pt for may and June sed added txs.

## 2013-06-26 ENCOUNTER — Ambulatory Visit (HOSPITAL_BASED_OUTPATIENT_CLINIC_OR_DEPARTMENT_OTHER): Payer: BC Managed Care – PPO

## 2013-06-26 VITALS — BP 119/59 | HR 64 | Temp 97.7°F

## 2013-06-26 DIAGNOSIS — Z5189 Encounter for other specified aftercare: Secondary | ICD-10-CM

## 2013-06-26 DIAGNOSIS — C50919 Malignant neoplasm of unspecified site of unspecified female breast: Secondary | ICD-10-CM

## 2013-06-26 DIAGNOSIS — C786 Secondary malignant neoplasm of retroperitoneum and peritoneum: Secondary | ICD-10-CM

## 2013-06-26 DIAGNOSIS — C801 Malignant (primary) neoplasm, unspecified: Secondary | ICD-10-CM

## 2013-06-26 LAB — CA 125: CA 125: 69.2 U/mL — ABNORMAL HIGH (ref 0.0–30.2)

## 2013-06-26 MED ORDER — PEGFILGRASTIM INJECTION 6 MG/0.6ML
6.0000 mg | Freq: Once | SUBCUTANEOUS | Status: AC
  Administered 2013-06-26: 6 mg via SUBCUTANEOUS
  Filled 2013-06-26: qty 0.6

## 2013-07-07 ENCOUNTER — Encounter: Payer: Self-pay | Admitting: Oncology

## 2013-07-08 NOTE — Telephone Encounter (Signed)
Added Dr. Dustin Flock to Care Team.  Routed labs to Dr. Candis Schatz.  Called patient.  Message left on voicemail Identified as [name] requesting return call.  Awaiting return call.

## 2013-07-09 ENCOUNTER — Encounter: Payer: Self-pay | Admitting: Medical Oncology

## 2013-07-09 NOTE — Telephone Encounter (Signed)
Called patient who says she "brought a prescription of what [name] wanted collected and the treatment nurse drew an extra tube."  Informed her the orders read what she described and this nurse has faxed results electronically to [name].  [name] thankful and denies any further needs.

## 2013-07-14 ENCOUNTER — Other Ambulatory Visit: Payer: Self-pay | Admitting: Oncology

## 2013-07-14 ENCOUNTER — Encounter: Payer: Self-pay | Admitting: Oncology

## 2013-07-14 MED ORDER — GABAPENTIN 100 MG PO CAPS: ORAL_CAPSULE | ORAL | Status: DC

## 2013-07-15 ENCOUNTER — Other Ambulatory Visit: Payer: Self-pay

## 2013-07-16 ENCOUNTER — Encounter: Payer: Self-pay | Admitting: Physician Assistant

## 2013-07-16 ENCOUNTER — Ambulatory Visit (HOSPITAL_BASED_OUTPATIENT_CLINIC_OR_DEPARTMENT_OTHER): Payer: BC Managed Care – PPO | Admitting: Physician Assistant

## 2013-07-16 ENCOUNTER — Telehealth: Payer: Self-pay | Admitting: Oncology

## 2013-07-16 ENCOUNTER — Ambulatory Visit: Payer: BC Managed Care – PPO

## 2013-07-16 ENCOUNTER — Other Ambulatory Visit (HOSPITAL_BASED_OUTPATIENT_CLINIC_OR_DEPARTMENT_OTHER): Payer: BC Managed Care – PPO

## 2013-07-16 VITALS — BP 117/69 | HR 61 | Temp 98.5°F | Resp 18 | Ht 62.0 in | Wt 158.1 lb

## 2013-07-16 DIAGNOSIS — C786 Secondary malignant neoplasm of retroperitoneum and peritoneum: Secondary | ICD-10-CM

## 2013-07-16 DIAGNOSIS — C801 Malignant (primary) neoplasm, unspecified: Secondary | ICD-10-CM

## 2013-07-16 LAB — CBC WITH DIFFERENTIAL/PLATELET
BASO%: 0.3 % (ref 0.0–2.0)
Basophils Absolute: 0 10*3/uL (ref 0.0–0.1)
EOS%: 0.7 % (ref 0.0–7.0)
Eosinophils Absolute: 0 10*3/uL (ref 0.0–0.5)
HCT: 34.4 % — ABNORMAL LOW (ref 34.8–46.6)
HGB: 10.6 g/dL — ABNORMAL LOW (ref 11.6–15.9)
LYMPH%: 37.1 % (ref 14.0–49.7)
MCH: 29.7 pg (ref 25.1–34.0)
MCHC: 30.8 g/dL — AB (ref 31.5–36.0)
MCV: 96.4 fL (ref 79.5–101.0)
MONO#: 0.4 10*3/uL (ref 0.1–0.9)
MONO%: 14 % (ref 0.0–14.0)
NEUT#: 1.4 10*3/uL — ABNORMAL LOW (ref 1.5–6.5)
NEUT%: 47.9 % (ref 38.4–76.8)
Platelets: 57 10*3/uL — ABNORMAL LOW (ref 145–400)
RBC: 3.57 10*6/uL — AB (ref 3.70–5.45)
RDW: 14.7 % — ABNORMAL HIGH (ref 11.2–14.5)
WBC: 3 10*3/uL — AB (ref 3.9–10.3)
lymph#: 1.1 10*3/uL (ref 0.9–3.3)

## 2013-07-16 LAB — COMPREHENSIVE METABOLIC PANEL (CC13)
ALT: 7 U/L (ref 0–55)
AST: 14 U/L (ref 5–34)
Albumin: 3.4 g/dL — ABNORMAL LOW (ref 3.5–5.0)
Alkaline Phosphatase: 55 U/L (ref 40–150)
Anion Gap: 9 mEq/L (ref 3–11)
BILIRUBIN TOTAL: 0.33 mg/dL (ref 0.20–1.20)
BUN: 17.5 mg/dL (ref 7.0–26.0)
CO2: 26 mEq/L (ref 22–29)
Calcium: 9.7 mg/dL (ref 8.4–10.4)
Chloride: 105 mEq/L (ref 98–109)
Creatinine: 0.7 mg/dL (ref 0.6–1.1)
Glucose: 93 mg/dl (ref 70–140)
Potassium: 4.8 mEq/L (ref 3.5–5.1)
SODIUM: 139 meq/L (ref 136–145)
Total Protein: 7.5 g/dL (ref 6.4–8.3)

## 2013-07-16 MED ORDER — LIDOCAINE-PRILOCAINE 2.5-2.5 % EX CREA: TOPICAL_CREAM | CUTANEOUS | Status: DC | PRN

## 2013-07-16 NOTE — Telephone Encounter (Signed)
gv adn printed appt sched and avs for pt for June....sed added tx. °

## 2013-07-16 NOTE — Progress Notes (Signed)
Hematology and Oncology Follow Up Visit  [name] [id number] 1955-05-15 58 y.o. 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 This was diagnosed in July of 2014.   Prior Therapy:  She is status post paracentesis performed on 09/17/2012 with the cytology confirmed the presence of adenocarcinoma and immunohistochemical stains suggest GYN etiology. She is also status post lumpectomy for breast cancer diagnosed 25 years ago followed by radiation and chemotherapy under the care of Dr. Sonny Dandy. She did not receive any hormonal therapy.  Current therapy: Chemotherapy utilizing carboplatin and Taxotere started on 10/01/2012. Avastin was added with cycle 4. He is here for the next cycle of chemotherapy.  Interim History: [name] is a pleasant woman presented with peritoneal carcinomatosis and ascites and the diagnosis mentioned above. She is accompanied today by her husband. She complains increased fatigue from her baseline level as well as decreased by mouth intake of both food and fluids. She makes her self a milkshake with areas fruits and some protein powder. She will add at least one Ensure nutritional supplement will drink to her by mouth intake daily. She has occasionally noticed a small amount of bright red blood in her stool associated with some straining. She has had repeat labs today and presents to proceed with her next scheduled cycle of chemotherapy. She denied any fevers or chills. Has not reported any shortness of breath or cough. She denied any pain with swallowing.  She denied any specific pain. She denied nausea or vomiting, fever and chills.  She has reported  grade 1 neuropathy.  She has not any shortness of breath or chest pain. She denied any GYN bleeding. She has not reported any recent complications  or illnesses. She has not reported any increase in her abdominal pain or early satiety. Her mood has fluctuated and she admits to feeling "down" at times.   Medications: I have reviewed the patient's current medications.  Current Outpatient Prescriptions  Medication Sig Dispense Refill  . ALPRAZolam (XANAX) 0.25 MG tablet Take 0.25 mg by mouth. 1-2 by mouth at bedtime as needed, Rx'ed by Dr.Taylor      . clotrimazole-betamethasone (LOTRISONE) cream Apply 1 application topically 2 (two) times daily.  30 g  0  . diphenoxylate-atropine (LOMOTIL) 2.5-0.025 MG per tablet TAKE 1 TABLET BY MOUTH 4 TIMES A DAY AS NEEDED FOR DIARRHEA OR LOOSE STOOLS  30 tablet  0  . divalproex (DEPAKOTE ER) 500 MG 24 hr tablet Take 1,000 mg by mouth at bedtime.      . gabapentin (NEURONTIN) 100 MG capsule TAKE 1 CAPSULE BY MOUTH 3 TIMES DAILY.  90 capsule  0  . lamoTRIgine (LAMICTAL) 150 MG tablet Take 150 mg by mouth daily.      Marland Kitchen levothyroxine (SYNTHROID, LEVOTHROID) 100 MCG tablet Take 50-100 mcg by mouth daily before breakfast. Takes 1 tablet every day except Wednesday she takes 1/2 tablet      . lidocaine-prilocaine (EMLA) cream Apply topically as needed. Apply to port with every chemotherapy.  30 g  1  . ondansetron (ZOFRAN) 8 MG tablet Take 1 tablet (8 mg total) by mouth every 8 (eight) hours as needed for nausea.  20 tablet  0  . rOPINIRole (REQUIP) 1 MG tablet TAKE 1 TABLET BY MOUTH 3 HOURS BEFORE BEDTIME  30 tablet  2  . sertraline (ZOLOFT) 100 MG tablet Take  100 mg by mouth daily. Take 1/2 tablet every morning for 6-8 days then increase to 1 tablet every morning.      . sodium chloride (OCEAN) 0.65 % SOLN nasal spray Place 1 spray into both nostrils as needed for congestion.      Marland Kitchen UNABLE TO FIND 1 Units by Other route as needed. Cranial  Prosthesis  For  Alopecia  Due to  Chemotherapy.  1 Units  1  . Probiotic Product (ALIGN PO) Take 1 tablet by mouth daily.        No current facility-administered medications  for this visit.     Allergies: No Known Allergies  Past Medical History, Surgical history, Social history, and Family History were reviewed and updated.  Review of Systems: Remaining ROS negative. Physical Exam: Blood pressure 117/69, pulse 61, temperature 98.5 F (36.9 C), temperature source Oral, resp. rate 18, height 5\' 2"  (1.575 m), weight 158 lb 1.6 oz (71.714 kg), SpO2 98.00%.  ECOG: 1  General appearance: alert, cooperative and appears stated age did not appear in any distress. Her mood appeared stable. Head: Normocephalic, without obvious abnormality, atraumatic Neck: no adenopathy, no carotid bruit, no JVD, supple, symmetrical, trachea midline and thyroid not enlarged, symmetric, no tenderness/mass/nodules Lymph nodes: Cervical, supraclavicular, and axillary nodes normal. Heart:regular rate and rhythm, S1, S2 normal, no murmur, click, rub or gallop Lung:chest clear, no wheezing, rales, normal symmetric air entry Abdomin: soft, distended and shifting dullness noted.  non- tender to deep palpation. Bowel sounds present in all quadrants EXT:no erythema, induration, or nodules   Lab Results: Lab Results  Component Value Date   WBC 3.0* 07/16/2013   HGB 10.6* 07/16/2013   HCT 34.4* 07/16/2013   MCV 96.4 07/16/2013   PLT 57* 07/16/2013     Chemistry      Component Value Date/Time   NA 139 07/16/2013 1128   NA 136 07/03/2012 1359   K 4.8 07/16/2013 1128   K 4.1 07/03/2012 1359   CL 103 07/03/2012 1359   CO2 26 07/16/2013 1128   CO2 30 07/03/2012 1359   BUN 17.5 07/16/2013 1128   BUN 9 07/03/2012 1359   CREATININE 0.7 07/16/2013 1128   CREATININE 0.7 07/03/2012 1359      Component Value Date/Time   CALCIUM 9.7 07/16/2013 1128   CALCIUM 9.6 07/03/2012 1359   ALKPHOS 55 07/16/2013 1128   ALKPHOS 80 07/03/2012 1359   AST 14 07/16/2013 1128   AST 42* 07/03/2012 1359   ALT 7 07/16/2013 1128   ALT 30 07/03/2012 1359   BILITOT 0.33 07/16/2013 1128   BILITOT 0.5 07/03/2012 1359       Results for CORY, RAMA (MRN [id number]) as of 06/25/2013 11:38  Ref. Range 02/25/2013 11:40 03/26/2013 08:49 05/14/2013 10:25 06/04/2013 09:36  CA 125 Latest Range: 0.0-30.2 U/mL 140.3 (H) 109.0 (H) 87.3 (H) 67.9 (H)    EXAM:  NUCLEAR MEDICINE PET SKULL BASE TO THIGH  TECHNIQUE:  8.5 mCi F-18 FDG was injected intravenously. Full-ring PET imaging  was performed from the skull base to thigh after the radiotracer. CT  data was obtained and used for attenuation correction and anatomic  localization.  FASTING BLOOD GLUCOSE: Value: 91 mg/dl  COMPARISON: 02/25/2013  FINDINGS:  NECK  No hypermetabolic lymph nodes in the neck.  CHEST  No hypermetabolic mediastinal or hilar nodes. No suspicious  pulmonary nodules on the CT scan. Status post right mastectomy.  There is a right chest wall port a catheter with tip in  the  cavoatrial junction. A right axillary nodal dissection has been  performed.  ABDOMEN/PELVIS  No abnormal hypermetabolic activity within the liver, pancreas,  adrenal glands, or spleen. Again noted is evidence of peritoneal  metastases. The index lesion along the inferior margin of the right  hepatic lobe measures 6.3 x 4.0 cm and has an SUV max equal to 4.7.  Previously this measured 6.5 x 3.6 cm and had an SUV max equal to  the same. The second index lesion along the undersurface of the  ventral abdominal wall measure 2.5 x 9.7 cm and has an SUV max equal  to 5. Previously this measured 2.9 x 9.7 cm and had an SUV max equal  to 4.3. Small amount of ascites is again noted along the anterior  surface of the liver and dependent portion of the pelvis. Other  areas of peritoneal disease are grossly unchanged. No new or  progressive disease.  SKELETON  No focal hypermetabolic activity to suggest skeletal metastasis.  IMPRESSION:  1. Stable appearance of peritoneal disease within the abdomen and  pelvis.  2. No change in small volume abdominopelvic ascites.    Impression and  Plan:  58 year old woman with the following issues:  1. Peritoneal carcinomatosis with omental involvement. The pathology confirmed the presence of adenocarcinoma of likely GYN etiology. Her CA 125 was elevated at 333.5 on 09/19/2012. She has been on systemic chemotherapy with carboplatin and Taxotere since August of 2014. She continues to have an excellent tumor marker response with a drop down to 67. Her PET CT scan also showed stable disease. Her last cycle of chemotherapy was in the form of I plan to treat her today with the combination chemotherapy with  Avastin. The plan was for her to receive the same combination today however her platelet count 57,000 E. to proceeding with treatment today as scheduled. We will reschedule her chemotherapy to next week with repeat labs at that time to ensure that her platelet count is within treatable range. Patient and her husband are in great in agreement with this plan. Dr. Alen Blew is planning to use maintenance Avastin in the future probably starting in June of 2015.  2. Neutropenia prophylaxis she will receive Neulasta after each cycle.  3. IV access: She had a Port-A-Cath inserted without any complications and she has Emla cream as well.   4. Thrombocytopenia: The cuff to treat her will be 70,000.   5. Nausea prophylaxis: She will continue on Zofran.  6. Psychosocial support: I continue to offer her access to all resources including social worker evaluation as well as psychologist evaluation. At this time, she has her on therapist and we've counseled her that she is using and appears to be stable at this time.  7. Genetic counseling: She does have a rather strong maternal family history of early breast cancer as well as a personal history of breast cancer now she is developing possibly another cancer including omental carcinomatosis Dr. Alen Blew thinks she would be of prime candidate for genetic counseling to identify a possible genetic disorder.  8. The  followup: She will followup in 4 weeks for her next cycle of chemotherapy and M.D. visit     Carlton Adam, PA-C  5/28/201511:34 PM

## 2013-07-17 ENCOUNTER — Ambulatory Visit: Payer: BC Managed Care – PPO

## 2013-07-17 LAB — CA 125: CA 125: 57.8 U/mL — ABNORMAL HIGH (ref 0.0–30.2)

## 2013-07-17 NOTE — Patient Instructions (Signed)
Your platelet count is a bit too low to proceed with chemotherapy as scheduled today. We will reschedule her chemotherapy to next week with repeat labs to ensure that all labs are within treat will range. Followup with Dr. Alen Blew in 4 weeks with the start of your next scheduled cycle of chemotherapy

## 2013-07-23 ENCOUNTER — Encounter: Payer: Self-pay | Admitting: Oncology

## 2013-07-23 ENCOUNTER — Other Ambulatory Visit (HOSPITAL_BASED_OUTPATIENT_CLINIC_OR_DEPARTMENT_OTHER): Payer: BC Managed Care – PPO

## 2013-07-23 ENCOUNTER — Ambulatory Visit (HOSPITAL_BASED_OUTPATIENT_CLINIC_OR_DEPARTMENT_OTHER): Payer: BC Managed Care – PPO

## 2013-07-23 VITALS — BP 106/60 | HR 64 | Temp 96.9°F | Resp 18

## 2013-07-23 DIAGNOSIS — Z5111 Encounter for antineoplastic chemotherapy: Secondary | ICD-10-CM

## 2013-07-23 DIAGNOSIS — C50919 Malignant neoplasm of unspecified site of unspecified female breast: Secondary | ICD-10-CM

## 2013-07-23 DIAGNOSIS — C786 Secondary malignant neoplasm of retroperitoneum and peritoneum: Secondary | ICD-10-CM

## 2013-07-23 DIAGNOSIS — Z5112 Encounter for antineoplastic immunotherapy: Secondary | ICD-10-CM

## 2013-07-23 LAB — CBC WITH DIFFERENTIAL/PLATELET
BASO%: 0.5 % (ref 0.0–2.0)
Basophils Absolute: 0 10*3/uL (ref 0.0–0.1)
EOS%: 0.2 % (ref 0.0–7.0)
Eosinophils Absolute: 0 10*3/uL (ref 0.0–0.5)
HCT: 34.7 % — ABNORMAL LOW (ref 34.8–46.6)
HEMOGLOBIN: 11 g/dL — AB (ref 11.6–15.9)
LYMPH%: 33.9 % (ref 14.0–49.7)
MCH: 29.8 pg (ref 25.1–34.0)
MCHC: 31.7 g/dL (ref 31.5–36.0)
MCV: 94 fL (ref 79.5–101.0)
MONO#: 0.7 10*3/uL (ref 0.1–0.9)
MONO%: 17.7 % — ABNORMAL HIGH (ref 0.0–14.0)
NEUT#: 1.9 10*3/uL (ref 1.5–6.5)
NEUT%: 47.7 % (ref 38.4–76.8)
PLATELETS: 64 10*3/uL — AB (ref 145–400)
RBC: 3.7 10*6/uL (ref 3.70–5.45)
RDW: 16.1 % — ABNORMAL HIGH (ref 11.2–14.5)
WBC: 4 10*3/uL (ref 3.9–10.3)
lymph#: 1.3 10*3/uL (ref 0.9–3.3)

## 2013-07-23 LAB — COMPREHENSIVE METABOLIC PANEL (CC13)
ALT: 10 U/L (ref 0–55)
AST: 16 U/L (ref 5–34)
Albumin: 3.4 g/dL — ABNORMAL LOW (ref 3.5–5.0)
Alkaline Phosphatase: 63 U/L (ref 40–150)
Anion Gap: 11 mEq/L (ref 3–11)
BILIRUBIN TOTAL: 0.25 mg/dL (ref 0.20–1.20)
BUN: 17.1 mg/dL (ref 7.0–26.0)
CO2: 26 meq/L (ref 22–29)
Calcium: 9.4 mg/dL (ref 8.4–10.4)
Chloride: 104 mEq/L (ref 98–109)
Creatinine: 0.7 mg/dL (ref 0.6–1.1)
GLUCOSE: 84 mg/dL (ref 70–140)
POTASSIUM: 4.7 meq/L (ref 3.5–5.1)
Sodium: 140 mEq/L (ref 136–145)
TOTAL PROTEIN: 7.8 g/dL (ref 6.4–8.3)

## 2013-07-23 MED ORDER — CARBOPLATIN CHEMO INTRADERMAL TEST DOSE 100MCG/0.02ML
100.0000 ug | Freq: Once | INTRADERMAL | Status: AC
  Administered 2013-07-23: 100 ug via INTRADERMAL
  Filled 2013-07-23: qty 0.01

## 2013-07-23 MED ORDER — SODIUM CHLORIDE 0.9 % IV SOLN
15.0000 mg/kg | Freq: Once | INTRAVENOUS | Status: AC
  Administered 2013-07-23: 1150 mg via INTRAVENOUS
  Filled 2013-07-23: qty 46

## 2013-07-23 MED ORDER — ONDANSETRON 16 MG/50ML IVPB (CHCC)
INTRAVENOUS | Status: AC
  Filled 2013-07-23: qty 16

## 2013-07-23 MED ORDER — SODIUM CHLORIDE 0.9 % IV SOLN
Freq: Once | INTRAVENOUS | Status: AC
  Administered 2013-07-23: 11:00:00 via INTRAVENOUS

## 2013-07-23 MED ORDER — HEPARIN SOD (PORK) LOCK FLUSH 100 UNIT/ML IV SOLN
500.0000 [IU] | Freq: Once | INTRAVENOUS | Status: AC | PRN
  Administered 2013-07-23: 500 [IU]
  Filled 2013-07-23: qty 5

## 2013-07-23 MED ORDER — DEXAMETHASONE SODIUM PHOSPHATE 20 MG/5ML IJ SOLN
INTRAMUSCULAR | Status: AC
  Filled 2013-07-23: qty 5

## 2013-07-23 MED ORDER — SODIUM CHLORIDE 0.9 % IJ SOLN
10.0000 mL | INTRAMUSCULAR | Status: DC | PRN
  Administered 2013-07-23: 10 mL
  Filled 2013-07-23: qty 10

## 2013-07-23 MED ORDER — ONDANSETRON 16 MG/50ML IVPB (CHCC)
16.0000 mg | Freq: Once | INTRAVENOUS | Status: AC
  Administered 2013-07-23: 16 mg via INTRAVENOUS

## 2013-07-23 MED ORDER — DOCETAXEL CHEMO INJECTION 160 MG/16ML
60.0000 mg/m2 | Freq: Once | INTRAVENOUS | Status: AC
  Administered 2013-07-23: 110 mg via INTRAVENOUS
  Filled 2013-07-23: qty 11

## 2013-07-23 MED ORDER — DEXAMETHASONE SODIUM PHOSPHATE 20 MG/5ML IJ SOLN
20.0000 mg | Freq: Once | INTRAMUSCULAR | Status: AC
  Administered 2013-07-23: 20 mg via INTRAVENOUS

## 2013-07-23 MED ORDER — SODIUM CHLORIDE 0.9 % IV SOLN
476.0000 mg | Freq: Once | INTRAVENOUS | Status: AC
  Administered 2013-07-23: 480 mg via INTRAVENOUS
  Filled 2013-07-23: qty 48

## 2013-07-23 NOTE — Progress Notes (Signed)
Okay to treat today despite platelets count of 64, per Dr. Alen Blew.

## 2013-07-23 NOTE — Progress Notes (Signed)
Insurance is paying avastin also [id number] at 100%

## 2013-07-23 NOTE — Patient Instructions (Signed)
Bevacizumab injection What is this medicine? BEVACIZUMAB (be va SIZ yoo mab) is a chemotherapy drug. It targets a protein found in many cancer cell types, and halts cancer growth. This drug treats many cancers including non-small cell lung cancer, and colon or rectal cancer. It is usually given with other chemotherapy drugs. This medicine may be used for other purposes; ask your health care provider or pharmacist if you have questions. COMMON BRAND NAME(S): Avastin What should I tell my health care provider before I take this medicine? They need to know if you have any of these conditions: -blood clots -heart disease, including heart failure, heart attack, or chest pain (angina) -high blood pressure -infection (especially a virus infection such as chickenpox, cold sores, or herpes) -kidney disease -lung disease -prior chemotherapy with doxorubicin, daunorubicin, epirubicin, or other anthracycline type chemotherapy agents -recent or ongoing radiation therapy -recent surgery -stroke -an unusual or allergic reaction to bevacizumab, hamster proteins, mouse proteins, other medicines, foods, dyes, or preservatives -pregnant or trying to get pregnant -breast-feeding How should I use this medicine? This medicine is for infusion into a vein. It is given by a health care professional in a hospital or clinic setting. Talk to your pediatrician regarding the use of this medicine in children. Special care may be needed. Overdosage: If you think you have taken too much of this medicine contact a poison control center or emergency room at once. NOTE: This medicine is only for you. Do not share this medicine with others. What if I miss a dose? It is important not to miss your dose. Call your doctor or health care professional if you are unable to keep an appointment. What may interact with this medicine? Interactions are not expected. This list may not describe all possible interactions. Give your health  care provider a list of all the medicines, herbs, non-prescription drugs, or dietary supplements you use. Also tell them if you smoke, drink alcohol, or use illegal drugs. Some items may interact with your medicine. What should I watch for while using this medicine? Your condition will be monitored carefully while you are receiving this medicine. You will need important blood work and urine testing done while you are taking this medicine. During your treatment, let your health care professional know if you have any unusual symptoms, such as difficulty breathing. This medicine may rarely cause 'gastrointestinal perforation' (holes in the stomach, intestines or colon), a serious side effect requiring surgery to repair. This medicine should be started at least 28 days following major surgery and the site of the surgery should be totally healed. Check with your doctor before scheduling dental work or surgery while you are receiving this treatment. Talk to your doctor if you have recently had surgery or if you have a wound that has not healed. Do not become pregnant while taking this medicine. Women should inform their doctor if they wish to become pregnant or think they might be pregnant. There is a potential for serious side effects to an unborn child. Talk to your health care professional or pharmacist for more information. Do not breast-feed an infant while taking this medicine. This medicine has caused ovarian failure in some women. This medicine may interfere with the ability to have a child. You should talk to your doctor or health care professional if you are concerned about your fertility. What side effects may I notice from receiving this medicine? Side effects that you should report to your doctor or health care professional as soon as possible: -  allergic reactions like skin rash, itching or hives, swelling of the face, lips, or tongue -signs of infection - fever or chills, cough, sore throat, pain  or trouble passing urine -signs of decreased platelets or bleeding - bruising, pinpoint red spots on the skin, black, tarry stools, nosebleeds, blood in the urine -breathing problems -changes in vision -chest pain -confusion -jaw pain, especially after dental work -mouth sores -seizures -severe abdominal pain -severe headache -sudden numbness or weakness of the face, arm or leg -swelling of legs or ankles -symptoms of a stroke: change in mental awareness, inability to talk or move one side of the body (especially in patients with lung cancer) -trouble passing urine or change in the amount of urine -trouble speaking or understanding -trouble walking, dizziness, loss of balance or coordination Side effects that usually do not require medical attention (report to your doctor or health care professional if they continue or are bothersome): -constipation -diarrhea -dry skin -headache -loss of appetite -nausea, vomiting This list may not describe all possible side effects. Call your doctor for medical advice about side effects. You may report side effects to FDA at 1-800-FDA-1088. Where should I keep my medicine? This drug is given in a hospital or clinic and will not be stored at home. NOTE: This sheet is a summary. It may not cover all possible information. If you have questions about this medicine, talk to your doctor, pharmacist, or health care provider.  2014, Elsevier/[employer] Standard. (2010-01-06 16:25:37)   Carboplatin injection What is this medicine? CARBOPLATIN (KAR boe pla tin) is a chemotherapy drug. It targets fast dividing cells, like cancer cells, and causes these cells to die. This medicine is used to treat ovarian cancer and many other cancers. This medicine may be used for other purposes; ask your health care provider or pharmacist if you have questions. COMMON BRAND NAME(S): Paraplatin What should I tell my health care provider before I take this medicine? They need to  know if you have any of these conditions: -blood disorders -hearing problems -kidney disease -recent or ongoing radiation therapy -an unusual or allergic reaction to carboplatin, cisplatin, other chemotherapy, other medicines, foods, dyes, or preservatives -pregnant or trying to get pregnant -breast-feeding How should I use this medicine? This drug is usually given as an infusion into a vein. It is administered in a hospital or clinic by a specially trained health care professional. Talk to your pediatrician regarding the use of this medicine in children. Special care may be needed. Overdosage: If you think you have taken too much of this medicine contact a poison control center or emergency room at once. NOTE: This medicine is only for you. Do not share this medicine with others. What if I miss a dose? It is important not to miss a dose. Call your doctor or health care professional if you are unable to keep an appointment. What may interact with this medicine? -medicines for seizures -medicines to increase blood counts like filgrastim, pegfilgrastim, sargramostim -some antibiotics like amikacin, gentamicin, neomycin, streptomycin, tobramycin -vaccines Talk to your doctor or health care professional before taking any of these medicines: -acetaminophen -aspirin -ibuprofen -ketoprofen -naproxen This list may not describe all possible interactions. Give your health care provider a list of all the medicines, herbs, non-prescription drugs, or dietary supplements you use. Also tell them if you smoke, drink alcohol, or use illegal drugs. Some items may interact with your medicine. What should I watch for while using this medicine? Your condition will be monitored carefully while  you are receiving this medicine. You will need important blood work done while you are taking this medicine. This drug may make you feel generally unwell. This is not uncommon, as chemotherapy can affect healthy cells  as well as cancer cells. Report any side effects. Continue your course of treatment even though you feel ill unless your doctor tells you to stop. In some cases, you may be given additional medicines to help with side effects. Follow all directions for their use. Call your doctor or health care professional for advice if you get a fever, chills or sore throat, or other symptoms of a cold or flu. Do not treat yourself. This drug decreases your body's ability to fight infections. Try to avoid being around people who are sick. This medicine may increase your risk to bruise or bleed. Call your doctor or health care professional if you notice any unusual bleeding. Be careful brushing and flossing your teeth or using a toothpick because you may get an infection or bleed more easily. If you have any dental work done, tell your dentist you are receiving this medicine. Avoid taking products that contain aspirin, acetaminophen, ibuprofen, naproxen, or ketoprofen unless instructed by your doctor. These medicines may hide a fever. Do not become pregnant while taking this medicine. Women should inform their doctor if they wish to become pregnant or think they might be pregnant. There is a potential for serious side effects to an unborn child. Talk to your health care professional or pharmacist for more information. Do not breast-feed an infant while taking this medicine. What side effects may I notice from receiving this medicine? Side effects that you should report to your doctor or health care professional as soon as possible: -allergic reactions like skin rash, itching or hives, swelling of the face, lips, or tongue -signs of infection - fever or chills, cough, sore throat, pain or difficulty passing urine -signs of decreased platelets or bleeding - bruising, pinpoint red spots on the skin, black, tarry stools, nosebleeds -signs of decreased red blood cells - unusually weak or tired, fainting spells,  lightheadedness -breathing problems -changes in hearing -changes in vision -chest pain -high blood pressure -low blood counts - This drug may decrease the number of white blood cells, red blood cells and platelets. You may be at increased risk for infections and bleeding. -nausea and vomiting -pain, swelling, redness or irritation at the injection site -pain, tingling, numbness in the hands or feet -problems with balance, talking, walking -trouble passing urine or change in the amount of urine Side effects that usually do not require medical attention (report to your doctor or health care professional if they continue or are bothersome): -hair loss -loss of appetite -metallic taste in the mouth or changes in taste This list may not describe all possible side effects. Call your doctor for medical advice about side effects. You may report side effects to FDA at 1-800-FDA-1088. Where should I keep my medicine? This drug is given in a hospital or clinic and will not be stored at home. NOTE: This sheet is a summary. It may not cover all possible information. If you have questions about this medicine, talk to your doctor, pharmacist, or health care provider.  2014, [employer]/Gold Standard. (2007-05-13 14:38:05)   Docetaxel injection What is this medicine? DOCETAXEL (doe se TAX el) is a chemotherapy drug. It targets fast dividing cells, like cancer cells, and causes these cells to die. This medicine is used to treat many types of cancers like breast  cancer, certain stomach cancers, head and neck cancer, lung cancer, and prostate cancer. This medicine may be used for other purposes; ask your health care provider or pharmacist if you have questions. COMMON BRAND NAME(S): Docefrez , Taxotere What should I tell my health care provider before I take this medicine? They need to know if you have any of these conditions: -infection (especially a virus infection such as chickenpox, cold sores, or  herpes) -liver disease -low blood counts, like low white cell, platelet, or red cell counts -an unusual or allergic reaction to docetaxel, polysorbate 80, other chemotherapy agents, other medicines, foods, dyes, or preservatives -pregnant or trying to get pregnant -breast-feeding How should I use this medicine? This drug is given as an infusion into a vein. It is administered in a hospital or clinic by a specially trained health care professional. Talk to your pediatrician regarding the use of this medicine in children. Special care may be needed. Overdosage: If you think you have taken too much of this medicine contact a poison control center or emergency room at once. NOTE: This medicine is only for you. Do not share this medicine with others. What if I miss a dose? It is important not to miss your dose. Call your doctor or health care professional if you are unable to keep an appointment. What may interact with this medicine? -cyclosporine -erythromycin -ketoconazole -medicines to increase blood counts like filgrastim, pegfilgrastim, sargramostim -vaccines Talk to your doctor or health care professional before taking any of these medicines: -acetaminophen -aspirin -ibuprofen -ketoprofen -naproxen This list may not describe all possible interactions. Give your health care provider a list of all the medicines, herbs, non-prescription drugs, or dietary supplements you use. Also tell them if you smoke, drink alcohol, or use illegal drugs. Some items may interact with your medicine. What should I watch for while using this medicine? Your condition will be monitored carefully while you are receiving this medicine. You will need important blood work done while you are taking this medicine. This drug may make you feel generally unwell. This is not uncommon, as chemotherapy can affect healthy cells as well as cancer cells. Report any side effects. Continue your course of treatment even though  you feel ill unless your doctor tells you to stop. In some cases, you may be given additional medicines to help with side effects. Follow all directions for their use. Call your doctor or health care professional for advice if you get a fever, chills or sore throat, or other symptoms of a cold or flu. Do not treat yourself. This drug decreases your body's ability to fight infections. Try to avoid being around people who are sick. This medicine may increase your risk to bruise or bleed. Call your doctor or health care professional if you notice any unusual bleeding. Be careful brushing and flossing your teeth or using a toothpick because you may get an infection or bleed more easily. If you have any dental work done, tell your dentist you are receiving this medicine. Avoid taking products that contain aspirin, acetaminophen, ibuprofen, naproxen, or ketoprofen unless instructed by your doctor. These medicines may hide a fever. Do not become pregnant while taking this medicine. Women should inform their doctor if they wish to become pregnant or think they might be pregnant. There is a potential for serious side effects to an unborn child. Talk to your health care professional or pharmacist for more information. Do not breast-feed an infant while taking this medicine. What side effects may  I notice from receiving this medicine? Side effects that you should report to your doctor or health care professional as soon as possible: -allergic reactions like skin rash, itching or hives, swelling of the face, lips, or tongue -low blood counts - This drug may decrease the number of white blood cells, red blood cells and platelets. You may be at increased risk for infections and bleeding. -signs of infection - fever or chills, cough, sore throat, pain or difficulty passing urine -signs of decreased platelets or bleeding - bruising, pinpoint red spots on the skin, black, tarry stools, nosebleeds -signs of decreased red  blood cells - unusually weak or tired, fainting spells, lightheadedness -breathing problems -fast or irregular heartbeat -low blood pressure -mouth sores -nausea and vomiting -pain, swelling, redness or irritation at the injection site -pain, tingling, numbness in the hands or feet -swelling of the ankle, feet, hands -weight gain Side effects that usually do not require medical attention (report to your prescriber or health care professional if they continue or are bothersome): -bone pain -complete hair loss including hair on your head, underarms, pubic hair, eyebrows, and eyelashes -diarrhea -excessive tearing -changes in the color of fingernails -loosening of the fingernails -nausea -muscle pain -red flush to skin -sweating -weak or tired This list may not describe all possible side effects. Call your doctor for medical advice about side effects. You may report side effects to FDA at 1-800-FDA-1088. Where should I keep my medicine? This drug is given in a hospital or clinic and will not be stored at home. NOTE: This sheet is a summary. It may not cover all possible information. If you have questions about this medicine, talk to your doctor, pharmacist, or health care provider.  2014, Elsevier/Gold Standard. (2008-01-19 11:52:10)

## 2013-07-23 NOTE — Progress Notes (Signed)
Insurance is paying Neulasta 100% °

## 2013-07-24 ENCOUNTER — Ambulatory Visit (HOSPITAL_BASED_OUTPATIENT_CLINIC_OR_DEPARTMENT_OTHER): Payer: BC Managed Care – PPO

## 2013-07-24 VITALS — BP 113/61 | HR 59 | Temp 97.9°F

## 2013-07-24 DIAGNOSIS — R180 Malignant ascites: Secondary | ICD-10-CM

## 2013-07-24 DIAGNOSIS — Z5189 Encounter for other specified aftercare: Secondary | ICD-10-CM

## 2013-07-24 DIAGNOSIS — C786 Secondary malignant neoplasm of retroperitoneum and peritoneum: Secondary | ICD-10-CM

## 2013-07-24 DIAGNOSIS — C50919 Malignant neoplasm of unspecified site of unspecified female breast: Secondary | ICD-10-CM

## 2013-07-24 MED ORDER — PEGFILGRASTIM INJECTION 6 MG/0.6ML
6.0000 mg | Freq: Once | SUBCUTANEOUS | Status: AC
  Administered 2013-07-24: 6 mg via SUBCUTANEOUS
  Filled 2013-07-24: qty 0.6

## 2013-08-01 ENCOUNTER — Encounter (HOSPITAL_COMMUNITY): Payer: Self-pay | Admitting: Emergency Medicine

## 2013-08-01 ENCOUNTER — Emergency Department (HOSPITAL_COMMUNITY)
Admission: EM | Admit: 2013-08-01 | Discharge: 2013-08-01 | Disposition: A | Discharge status: Home / Self Care | Payer: BC Managed Care – PPO | Attending: Emergency Medicine | Admitting: Emergency Medicine

## 2013-08-01 DIAGNOSIS — Z8742 Personal history of other diseases of the female genital tract: Secondary | ICD-10-CM | POA: Insufficient documentation

## 2013-08-01 DIAGNOSIS — Z853 Personal history of malignant neoplasm of breast: Secondary | ICD-10-CM | POA: Insufficient documentation

## 2013-08-01 DIAGNOSIS — R040 Epistaxis: Secondary | ICD-10-CM

## 2013-08-01 DIAGNOSIS — F411 Generalized anxiety disorder: Secondary | ICD-10-CM | POA: Insufficient documentation

## 2013-08-01 DIAGNOSIS — Z79899 Other long term (current) drug therapy: Secondary | ICD-10-CM | POA: Insufficient documentation

## 2013-08-01 DIAGNOSIS — Z8739 Personal history of other diseases of the musculoskeletal system and connective tissue: Secondary | ICD-10-CM | POA: Insufficient documentation

## 2013-08-01 DIAGNOSIS — IMO0002 Reserved for concepts with insufficient information to code with codable children: Secondary | ICD-10-CM | POA: Insufficient documentation

## 2013-08-01 DIAGNOSIS — E039 Hypothyroidism, unspecified: Secondary | ICD-10-CM | POA: Insufficient documentation

## 2013-08-01 DIAGNOSIS — D696 Thrombocytopenia, unspecified: Secondary | ICD-10-CM | POA: Insufficient documentation

## 2013-08-01 DIAGNOSIS — E119 Type 2 diabetes mellitus without complications: Secondary | ICD-10-CM | POA: Insufficient documentation

## 2013-08-01 DIAGNOSIS — F319 Bipolar disorder, unspecified: Secondary | ICD-10-CM | POA: Insufficient documentation

## 2013-08-01 LAB — CBC WITH DIFFERENTIAL/PLATELET
Band Neutrophils: 25 % — ABNORMAL HIGH (ref 0–10)
Basophils Absolute: 0 10*3/uL (ref 0.0–0.1)
Basophils Relative: 0 % (ref 0–1)
Eosinophils Absolute: 0 10*3/uL (ref 0.0–0.7)
Eosinophils Relative: 0 % (ref 0–5)
HCT: 32.4 % — ABNORMAL LOW (ref 36.0–46.0)
Hemoglobin: 10 g/dL — ABNORMAL LOW (ref 12.0–15.0)
Lymphocytes Relative: 26 % (ref 12–46)
Lymphs Abs: 3.3 10*3/uL (ref 0.7–4.0)
MCH: 29.7 pg (ref 26.0–34.0)
MCHC: 30.9 g/dL (ref 30.0–36.0)
MCV: 96.1 fL (ref 78.0–100.0)
Metamyelocytes Relative: 3 %
Monocytes Absolute: 1 10*3/uL (ref 0.1–1.0)
Monocytes Relative: 8 % (ref 3–12)
Myelocytes: 1 %
Neutro Abs: 8.2 10*3/uL — ABNORMAL HIGH (ref 1.7–7.7)
Neutrophils Relative %: 37 % — ABNORMAL LOW (ref 43–77)
Platelets: 54 10*3/uL — ABNORMAL LOW (ref 150–400)
RBC: 3.37 MIL/uL — ABNORMAL LOW (ref 3.87–5.11)
RDW: 15.7 % — ABNORMAL HIGH (ref 11.5–15.5)
WBC Morphology: INCREASED
WBC: 12.5 10*3/uL — ABNORMAL HIGH (ref 4.0–10.5)

## 2013-08-01 MED ORDER — OXYMETAZOLINE HCL 0.05 % NA SOLN
2.0000 | Freq: Once | NASAL | Status: AC
  Administered 2013-08-01: 2 via NASAL
  Filled 2013-08-01: qty 15

## 2013-08-01 NOTE — Discharge Instructions (Signed)
Nosebleed Nosebleeds can be caused by many conditions including trauma, infections, polyps, foreign bodies, dry mucous membranes or climate, medications and air conditioning. Most nosebleeds occur in the front of the nose. It is because of this location that most nosebleeds can be controlled by pinching the nostrils gently and continuously. Do this for at least 10 to 20 minutes. The reason for this long continuous pressure is that you must hold it long enough for the blood to clot. If during that 10 to 20 minute time period, pressure is released, the process may have to be started again. The nosebleed may stop by itself, quit with pressure, need concentrated heating (cautery) or stop with pressure from packing. HOME CARE INSTRUCTIONS   If your nose was packed, try to maintain the pack inside until your caregiver removes it. If a gauze pack was used and it starts to fall out, gently replace or cut the end off. Do not cut if a balloon catheter was used to pack the nose. Otherwise, do not remove unless instructed.  Avoid blowing your nose for 12 hours after treatment. This could dislodge the pack or clot and start bleeding again.  If the bleeding starts again, sit up and bending forward, gently pinch the front half of your nose continuously for 20 minutes.  If bleeding was caused by dry mucous membranes, cover the inside of your nose every morning with a petroleum or antibiotic ointment. Use your little fingertip as an applicator. Do this as needed during dry weather. This will keep the mucous membranes moist and allow them to heal.  Maintain humidity in your home by using less air conditioning or using a humidifier.  Do not use aspirin or medications which make bleeding more likely. Your caregiver can give you recommendations on this.  Resume normal activities as able but try to avoid straining, lifting or bending at the waist for several days.  If the nosebleeds become recurrent and the cause is  unknown, your caregiver may suggest laboratory tests. SEEK IMMEDIATE MEDICAL CARE IF:   Bleeding recurs and cannot be controlled.  There is unusual bleeding from or bruising on other parts of the body.  You have a fever.  Nosebleeds continue.  There is any worsening of the condition which originally brought you in.  You become lightheaded, feel faint, become sweaty or vomit blood. MAKE SURE YOU:   Understand these instructions.  Will watch your condition.  Will get help right away if you are not doing well or get worse. Document Released: 11/15/2004 Document Revised: 04/30/2011 Document Reviewed: 01/07/2009 [facility] Patient Information 2014 [place], [place].  Thrombocytopenia Thrombocytopenia is a condition in which there is an abnormally small number of platelets in your blood. Platelets are also called thrombocytes. Platelets are needed for blood clotting. CAUSES Thrombocytopenia is caused by:   Decreased production of platelets. This can be caused by:  Aplastic anemia in which your bone marrow quits making blood cells.  Cancer in the bone marrow.  Use of certain medicines, including chemotherapy.  Infection in the bone marrow.  Heavy alcohol consumption.  Increased destruction of platelets. This can be caused by:  Certain immune diseases.  Use of certain drugs.  Certain blood clotting disorders.  Certain inherited disorders.  Certain bleeding disorders.  Pregnancy.  Having an enlarged spleen (hypersplenism). In hypersplenism, the spleen gathers up platelets from circulation. This means the platelets are not available to help with blood clotting. The spleen can enlarge due to cirrhosis or other conditions. SYMPTOMS  The symptoms of thrombocytopenia are side effects of poor blood clotting. Some of these are:  Abnormal bleeding.  Nosebleeds.  Heavy menstrual periods.  Blood in the urine or stools.  Purpura. This is a purplish discoloration in the skin  produced by small bleeding vessels near the surface of the skin.  Bruising.  A rash that may be petechial. This looks like pinpoint, purplish-red spots on the skin and mucous membranes. It is caused by bleeding from small blood vessels (capillaries). DIAGNOSIS  Your caregiver will make this diagnosis based on your exam and blood tests. Sometimes, a bone marrow study is done to look for the original cells (megakaryocytes) that make platelets. TREATMENT  Treatment depends on the cause of the condition.  Medicines may be given to help protect your platelets from being destroyed.  In some cases, a replacement (transfusion) of platelets may be required to stop or prevent bleeding.  Sometimes, the spleen must be surgically removed. HOME CARE INSTRUCTIONS   Check the skin and linings inside your mouth for bruising or bleeding as directed by your caregiver.  Check your sputum, urine, and stool for blood as directed by your caregiver.  Do not return to any activities that could cause bumps or bruises until your caregiver says it is okay.  Take extra care not to cut yourself when shaving or when using scissors, needles, knives, and other tools.  Take extra care not to burn yourself when ironing or cooking.  Ask your caregiver if it is okay for you to drink alcohol.  Only take over-the-counter or prescription medicines as directed by your caregiver.  Notify all your caregivers, including dentists and eye doctors, about your condition. SEEK IMMEDIATE MEDICAL CARE IF:   You develop active bleeding from anywhere in your body.  You develop unexplained bruising or bleeding.  You have blood in your sputum, urine, or stool. MAKE SURE YOU:  Understand these instructions.  Will watch your condition.  Will get help right away if you are not doing well or get worse. Document Released: 02/05/2005 Document Revised: 04/30/2011 Document Reviewed: 12/08/2010 [facility] Patient Information 2014  [address], [place].

## 2013-08-01 NOTE — ED Notes (Signed)
Pt sates that she woke up with her nose bleeding and blood all around her.  Pt had chemo last week and her PLT 64 last Thursday.

## 2013-08-01 NOTE — ED Provider Notes (Signed)
CSN: [id number]     Arrival date & time 08/01/13  6073 History   First MD Initiated Contact with Patient 08/01/13 1001     Chief Complaint  Patient presents with  . Epistaxis     (Consider location/radiation/quality/duration/timing/severity/associated sxs/prior Treatment) HPI  58 year old female with epistaxis. Patient states she woke up this morning with blood on her bed sheets around her. Seems like it has stopped or at least slowed. No n/v. No BRBPR or melena. No blood from gums. No dizziness or lightheadedness. No blood thinners, but reports her platelets are low.  Past Medical History  Diagnosis Date  . Premature menopause   . Osteopenia 03/2009    t score -2.1 FRAX 4.6/0.4  . Hypothyroidism   . Depression   . Anxiety   . Hyperlipidemia   . Bipolar disorder   . ADD (attention deficit disorder)   . Diabetes mellitus   . Breast cancer 1989   Past Surgical History  Procedure Laterality Date  . Breast surgery  1989    RIGHT LUMPECTOMY, RADIATION AND CHEMO  . Hysteroscopy  2011    Polyp  . Pelvic laparoscopy/ hysteroscopy  1996  . Foot surgery  2013    BILATERAL    Family History  Problem Relation Age of Onset  . Breast cancer Maternal Aunt 30  . Diabetes Maternal Grandmother   . Heart disease Maternal Grandmother   . Heart disease Maternal Grandfather   . Hypertension Paternal Grandfather   . Breast cancer Maternal Aunt 70  . Breast cancer Maternal Aunt 51  . Ovarian cancer Neg Hx   . Allergies Father    History  Substance Use Topics  . Smoking status: Never Smoker   . Smokeless tobacco: Never Used  . Alcohol Use: Yes     Comment: very rarely   OB History   Grav Para Term Preterm Abortions TAB SAB Ect Mult Living   0              Review of Systems  All systems reviewed and negative, other than as noted in HPI.   Allergies  Review of patient's allergies indicates no known allergies.  Home Medications   Prior to Admission medications    Medication Sig Start Date End Date Taking? Authorizing Provider  ALPRAZolam (XANAX) 0.25 MG tablet Take 0.25 mg by mouth. 1-2 by mouth at bedtime as needed, Rx'ed by Dr.Taylor    Historical Provider, MD  clotrimazole-betamethasone (LOTRISONE) cream Apply 1 application topically 2 (two) times daily. 02/26/13   Wyatt Portela, MD  diphenoxylate-atropine (LOMOTIL) 2.5-0.025 MG per tablet TAKE 1 TABLET BY MOUTH 4 TIMES A DAY AS NEEDED FOR DIARRHEA OR LOOSE STOOLS 04/06/13   Wyatt Portela, MD  divalproex (DEPAKOTE ER) 500 MG 24 hr tablet Take 1,000 mg by mouth at bedtime.    Historical Provider, MD  gabapentin (NEURONTIN) 100 MG capsule TAKE 1 CAPSULE BY MOUTH 3 TIMES DAILY. 07/14/13   Wyatt Portela, MD  lamoTRIgine (LAMICTAL) 150 MG tablet Take 150 mg by mouth daily.    Historical Provider, MD  levothyroxine (SYNTHROID, LEVOTHROID) 100 MCG tablet Take 50-100 mcg by mouth daily before breakfast. Takes 1 tablet every day except Wednesday she takes 1/2 tablet    Historical Provider, MD  lidocaine-prilocaine (EMLA) cream Apply topically as needed. Apply to port with every chemotherapy. 07/16/13   Carlton Adam, PA-C  ondansetron (ZOFRAN) 8 MG tablet Take 1 tablet (8 mg total) by mouth every 8 (eight) hours as  needed for nausea. 09/19/12   Wyatt Portela, MD  Probiotic Product (ALIGN PO) Take 1 tablet by mouth daily.     Historical Provider, MD  rOPINIRole (REQUIP) 1 MG tablet TAKE 1 TABLET BY MOUTH 3 HOURS BEFORE BEDTIME 04/15/13   Hendricks Limes, MD  sertraline (ZOLOFT) 100 MG tablet Take 100 mg by mouth daily. Take 1/2 tablet every morning for 6-8 days then increase to 1 tablet every morning.    Historical Provider, MD  sodium chloride (OCEAN) 0.65 % SOLN nasal spray Place 1 spray into both nostrils as needed for congestion.    Historical Provider, MD  UNABLE TO FIND 1 Units by Other route as needed. Cranial  Prosthesis  For  Alopecia  Due to  Chemotherapy. 06/10/13   Wyatt Portela, MD   BP 113/71  Pulse  68  Temp(Src) 97.6 F (36.4 C) (Oral)  Resp 18 Physical Exam  Nursing note and vitals reviewed. Constitutional: She appears well-developed and well-nourished. No distress.  HENT:  Head: Normocephalic and atraumatic.  Dried blood left nare. Posterior pharynx is clear.  Eyes: Conjunctivae are normal. Right eye exhibits no discharge. Left eye exhibits no discharge.  Neck: Neck supple.  Cardiovascular: Normal rate, regular rhythm and normal heart sounds.  Exam reveals no gallop and no friction rub.   No murmur heard. Pulmonary/Chest: Effort normal and breath sounds normal. No respiratory distress.  Abdominal: Soft. She exhibits no distension. There is no tenderness.  Musculoskeletal: She exhibits no edema and no tenderness.  Neurological: She is alert.  Skin: Skin is warm and dry.  Psychiatric: She has a normal mood and affect. Her behavior is normal. Thought content normal.    ED Course  Procedures (including critical care time) Labs Review Labs Reviewed  CBC WITH DIFFERENTIAL - Abnormal; Notable for the following:    WBC 12.5 (*)    RBC 3.37 (*)    Hemoglobin 10.0 (*)    HCT 32.4 (*)    RDW 15.7 (*)    Platelets 54 (*)    Neutrophils Relative % 37 (*)    Band Neutrophils 25 (*)    Neutro Abs 8.2 (*)    All other components within normal limits    Imaging Review No results found.   EKG Interpretation None      MDM   Final diagnoses:  Epistaxis  Thrombocytopenia   58 year old female with epistaxis. Currently resolved. Thrombocytopenia, but per review of records this has been fairly stable over the past few months. Denies any bleeding or melena elsewhere. Some anemia, but not profound and has been in this range previously as well. Normal heart rate. Blood pressure normal. No blood thinners. Feel she is stable for discharge. Return precautions were discussed.  Platelets: 1wk ago: 64 (07/23/13) 2wk ago: 57 (07/16/13) 52mo ago: 72 (06/25/13) 94mo ago: 73 (06/04/13) 46mo  ago: 84 (05/14/13) 47mo ago: 66 (04/23/13) 36mo ago: [place], MD 08/06/13 1126

## 2013-08-03 ENCOUNTER — Other Ambulatory Visit (HOSPITAL_BASED_OUTPATIENT_CLINIC_OR_DEPARTMENT_OTHER): Payer: BC Managed Care – PPO | Admitting: *Deleted

## 2013-08-03 ENCOUNTER — Telehealth: Payer: Self-pay | Admitting: *Deleted

## 2013-08-03 ENCOUNTER — Ambulatory Visit (HOSPITAL_BASED_OUTPATIENT_CLINIC_OR_DEPARTMENT_OTHER): Payer: BC Managed Care – PPO

## 2013-08-03 ENCOUNTER — Ambulatory Visit: Payer: BC Managed Care – PPO

## 2013-08-03 ENCOUNTER — Other Ambulatory Visit: Payer: Self-pay | Admitting: *Deleted

## 2013-08-03 VITALS — BP 137/70 | HR 75 | Temp 98.1°F

## 2013-08-03 DIAGNOSIS — C786 Secondary malignant neoplasm of retroperitoneum and peritoneum: Secondary | ICD-10-CM

## 2013-08-03 DIAGNOSIS — R5383 Other fatigue: Secondary | ICD-10-CM

## 2013-08-03 DIAGNOSIS — R5381 Other malaise: Secondary | ICD-10-CM

## 2013-08-03 DIAGNOSIS — C801 Malignant (primary) neoplasm, unspecified: Secondary | ICD-10-CM

## 2013-08-03 LAB — COMPREHENSIVE METABOLIC PANEL (CC13)
ALT: 8 U/L (ref 0–55)
ANION GAP: 7 meq/L (ref 3–11)
AST: 17 U/L (ref 5–34)
Albumin: 3.3 g/dL — ABNORMAL LOW (ref 3.5–5.0)
Alkaline Phosphatase: 80 U/L (ref 40–150)
BUN: 7.1 mg/dL (ref 7.0–26.0)
CHLORIDE: 104 meq/L (ref 98–109)
CO2: 29 mEq/L (ref 22–29)
CREATININE: 0.7 mg/dL (ref 0.6–1.1)
Calcium: 8.9 mg/dL (ref 8.4–10.4)
Glucose: 92 mg/dl (ref 70–140)
Potassium: 4.1 mEq/L (ref 3.5–5.1)
Sodium: 140 mEq/L (ref 136–145)
Total Bilirubin: 0.2 mg/dL (ref 0.20–1.20)
Total Protein: 7.3 g/dL (ref 6.4–8.3)

## 2013-08-03 LAB — CBC WITH DIFFERENTIAL/PLATELET
BASO%: 0.3 % (ref 0.0–2.0)
Basophils Absolute: 0.1 10*3/uL (ref 0.0–0.1)
EOS%: 0.2 % (ref 0.0–7.0)
Eosinophils Absolute: 0 10*3/uL (ref 0.0–0.5)
HEMATOCRIT: 32 % — AB (ref 34.8–46.6)
HEMOGLOBIN: 9.8 g/dL — AB (ref 11.6–15.9)
LYMPH#: 1.8 10*3/uL (ref 0.9–3.3)
LYMPH%: 11 % — ABNORMAL LOW (ref 14.0–49.7)
MCH: 29.1 pg (ref 25.1–34.0)
MCHC: 30.7 g/dL — ABNORMAL LOW (ref 31.5–36.0)
MCV: 94.9 fL (ref 79.5–101.0)
MONO#: 0.7 10*3/uL (ref 0.1–0.9)
MONO%: 4.5 % (ref 0.0–14.0)
NEUT#: 13.9 10*3/uL — ABNORMAL HIGH (ref 1.5–6.5)
NEUT%: 84 % — AB (ref 38.4–76.8)
Platelets: 62 10*3/uL — ABNORMAL LOW (ref 145–400)
RBC: 3.37 10*6/uL — ABNORMAL LOW (ref 3.70–5.45)
RDW: 16.5 % — ABNORMAL HIGH (ref 11.2–14.5)
WBC: 16.6 10*3/uL — ABNORMAL HIGH (ref 3.9–10.3)

## 2013-08-03 LAB — HOLD TUBE, BLOOD BANK

## 2013-08-03 MED ORDER — SODIUM CHLORIDE 0.9 % IJ SOLN
10.0000 mL | INTRAMUSCULAR | Status: DC | PRN
  Administered 2013-08-03: 10 mL via INTRAVENOUS
  Filled 2013-08-03: qty 10

## 2013-08-03 MED ORDER — SODIUM CHLORIDE 0.9 % IV SOLN
1000.0000 mL | INTRAVENOUS | Status: DC
  Administered 2013-08-03: 1000 mL via INTRAVENOUS

## 2013-08-03 MED ORDER — ONDANSETRON HCL 8 MG PO TABS: 8.0000 mg | ORAL_TABLET | Freq: Three times a day (TID) | ORAL | Status: DC | PRN

## 2013-08-03 MED ORDER — HEPARIN SOD (PORK) LOCK FLUSH 100 UNIT/ML IV SOLN
500.0000 [IU] | Freq: Once | INTRAVENOUS | Status: AC
  Administered 2013-08-03: 500 [IU] via INTRAVENOUS
  Filled 2013-08-03: qty 5

## 2013-08-03 NOTE — Telephone Encounter (Signed)
   Provider input needed: EPISTAXIS   Reason for call: INTERMITTENT NOSE BLEEDS  Ears, nose, mouth, throat, and face: positive for epistaxis   ALLERGIES:  has No Known Allergies.  Patient last received chemotherapy/ treatment on 07/23/13  Patient was last seen in the office on 07/16/13  Next appt is 08/13/13  Is patient having fevers greater than 100.5?  no   Is patient having uncontrolled pain, or new pain? no   Is patient having new back pain that changes with position (worsens or eases when laying down?)  no   Is patient able to eat and drink? yes, FLUIDS 32 OUNCES IN THE PAST 24 HOURS. APPETITE IS IMPROVING.     Is patient able to pass stool without difficulty?   yes, BOWEL MOVEMENT THIS MORNING BUT THERE WAS BRIGHT RED BLOOD.     Is patient having uncontrolled nausea?  no    patient calls 08/03/2013 with complaint of  Ears, nose, mouth, throat, and face: positive for epistaxis   Summary Based on the above information advised patient to FORCE FLUIDS.   Waverly Ferrari M  08/03/2013, 1:00 PM   Background Info  [name]   DOB: December 18, 1955   MR#: [id number]   CSN#   [id number] 6/15/2015PT.

## 2013-08-03 NOTE — Patient Instructions (Signed)
Dehydration, Adult Dehydration is when you lose more fluids from the body than you take in. Vital organs like the kidneys, brain, and heart cannot function without a proper amount of fluids and salt. Any loss of fluids from the body can cause dehydration.  CAUSES   Vomiting.  Diarrhea.  Excessive sweating.  Excessive urine output.  Fever. SYMPTOMS  Mild dehydration  Thirst.  Dry lips.  Slightly dry mouth. Moderate dehydration  Very dry mouth.  Sunken eyes.  Skin does not bounce back quickly when lightly pinched and released.  Dark urine and decreased urine production.  Decreased tear production.  Headache. Severe dehydration  Very dry mouth.  Extreme thirst.  Rapid, weak pulse (more than 100 beats per minute at rest).  Cold hands and feet.  Not able to sweat in spite of heat and temperature.  Rapid breathing.  Blue lips.  Confusion and lethargy.  Difficulty being awakened.  Minimal urine production.  No tears. DIAGNOSIS  Your caregiver will diagnose dehydration based on your symptoms and your exam. Blood and urine tests will help confirm the diagnosis. The diagnostic evaluation should also identify the cause of dehydration. TREATMENT  Treatment of mild or moderate dehydration can often be done at home by increasing the amount of fluids that you drink. It is best to drink small amounts of fluid more often. Drinking too much at one time can make vomiting worse. Refer to the home care instructions below. Severe dehydration needs to be treated at the hospital where you will probably be given intravenous (IV) fluids that contain water and electrolytes. HOME CARE INSTRUCTIONS   Ask your caregiver about specific rehydration instructions.  Drink enough fluids to keep your urine clear or pale yellow.  Drink small amounts frequently if you have nausea and vomiting.  Eat as you normally do.  Avoid:  Foods or drinks high in sugar.  Carbonated  drinks.  Juice.  Extremely hot or cold fluids.  Drinks with caffeine.  Fatty, greasy foods.  Alcohol.  Tobacco.  Overeating.  Gelatin desserts.  Wash your hands well to avoid spreading bacteria and viruses.  Only take over-the-counter or prescription medicines for pain, discomfort, or fever as directed by your caregiver.  Ask your caregiver if you should continue all prescribed and over-the-counter medicines.  Keep all follow-up appointments with your caregiver. SEEK MEDICAL CARE IF:  You have abdominal pain and it increases or stays in one area (localizes).  You have a rash, stiff neck, or severe headache.  You are irritable, sleepy, or difficult to awaken.  You are weak, dizzy, or extremely thirsty. SEEK IMMEDIATE MEDICAL CARE IF:   You are unable to keep fluids down or you get worse despite treatment.  You have frequent episodes of vomiting or diarrhea.  You have blood or green matter (bile) in your vomit.  You have blood in your stool or your stool looks black and tarry.  You have not urinated in 6 to 8 hours, or you have only urinated a small amount of very dark urine.  You have a fever.  You faint. MAKE SURE YOU:   Understand these instructions.  Will watch your condition.  Will get help right away if you are not doing well or get worse. Document Released: 02/05/2005 Document Revised: 04/30/2011 Document Reviewed: 09/25/2010 Exit[employer] Patient Information 2014 [employer].  

## 2013-08-03 NOTE — Telephone Encounter (Signed)
VERBAL ORDER AND READ BACK TO [place]- PT. TO COME TO THIS OFFICE FOR A STAT CBC WITH DIFF PLUS A TYPE AND HOLD. NOTIFIED PT. SHE VOICES UNDERSTANDING. PT. ARRIVED FOR LAB. SHE "FEELS WEAK" AND LOOKS PALE. NO APPETITE. TALKED WITH PT. ABOUT EATING SMALL FREQUENT MEALS AND FORCING FLUIDS. HGB. 9.8 AND PLT. 31. VERBAL ORDERS AND READ BACK TO [place]- NS ONE LITER OVER ONE AND A HALF HOURS. PT. TO INFUSION ROOM FOR FLUIDS. TYPE AND HOLD CANCELLED.

## 2013-08-04 LAB — CA 125: CA 125: 45.4 U/mL — ABNORMAL HIGH (ref 0.0–30.2)

## 2013-08-06 ENCOUNTER — Other Ambulatory Visit: Payer: BC Managed Care – PPO

## 2013-08-06 ENCOUNTER — Ambulatory Visit: Payer: BC Managed Care – PPO

## 2013-08-06 ENCOUNTER — Ambulatory Visit: Payer: BC Managed Care – PPO | Admitting: Oncology

## 2013-08-07 ENCOUNTER — Ambulatory Visit: Payer: BC Managed Care – PPO

## 2013-08-12 ENCOUNTER — Other Ambulatory Visit: Payer: Self-pay | Admitting: *Deleted

## 2013-08-12 ENCOUNTER — Other Ambulatory Visit: Payer: Self-pay | Admitting: Oncology

## 2013-08-12 DIAGNOSIS — C786 Secondary malignant neoplasm of retroperitoneum and peritoneum: Secondary | ICD-10-CM

## 2013-08-13 ENCOUNTER — Ambulatory Visit (HOSPITAL_BASED_OUTPATIENT_CLINIC_OR_DEPARTMENT_OTHER): Payer: BC Managed Care – PPO | Admitting: Oncology

## 2013-08-13 ENCOUNTER — Ambulatory Visit (HOSPITAL_BASED_OUTPATIENT_CLINIC_OR_DEPARTMENT_OTHER): Payer: BC Managed Care – PPO

## 2013-08-13 ENCOUNTER — Telehealth: Payer: Self-pay | Admitting: Oncology

## 2013-08-13 ENCOUNTER — Telehealth: Payer: Self-pay | Admitting: *Deleted

## 2013-08-13 ENCOUNTER — Encounter: Payer: Self-pay | Admitting: Oncology

## 2013-08-13 ENCOUNTER — Other Ambulatory Visit (HOSPITAL_BASED_OUTPATIENT_CLINIC_OR_DEPARTMENT_OTHER): Payer: BC Managed Care – PPO

## 2013-08-13 VITALS — BP 132/86 | HR 78 | Temp 97.8°F | Resp 18 | Ht 62.0 in | Wt 157.6 lb

## 2013-08-13 VITALS — BP 115/72 | HR 70

## 2013-08-13 DIAGNOSIS — C786 Secondary malignant neoplasm of retroperitoneum and peritoneum: Secondary | ICD-10-CM

## 2013-08-13 DIAGNOSIS — Z803 Family history of malignant neoplasm of breast: Secondary | ICD-10-CM

## 2013-08-13 DIAGNOSIS — C482 Malignant neoplasm of peritoneum, unspecified: Secondary | ICD-10-CM | POA: Insufficient documentation

## 2013-08-13 DIAGNOSIS — C569 Malignant neoplasm of unspecified ovary: Secondary | ICD-10-CM

## 2013-08-13 DIAGNOSIS — Z5112 Encounter for antineoplastic immunotherapy: Secondary | ICD-10-CM

## 2013-08-13 DIAGNOSIS — C801 Malignant (primary) neoplasm, unspecified: Secondary | ICD-10-CM

## 2013-08-13 LAB — COMPREHENSIVE METABOLIC PANEL (CC13)
ALT: 8 U/L (ref 0–55)
AST: 18 U/L (ref 5–34)
Albumin: 3.6 g/dL (ref 3.5–5.0)
Alkaline Phosphatase: 60 U/L (ref 40–150)
Anion Gap: 8 mEq/L (ref 3–11)
BUN: 18.8 mg/dL (ref 7.0–26.0)
CHLORIDE: 105 meq/L (ref 98–109)
CO2: 28 mEq/L (ref 22–29)
Calcium: 9.5 mg/dL (ref 8.4–10.4)
Creatinine: 0.8 mg/dL (ref 0.6–1.1)
Glucose: 90 mg/dl (ref 70–140)
Potassium: 4.3 mEq/L (ref 3.5–5.1)
SODIUM: 141 meq/L (ref 136–145)
Total Bilirubin: 0.35 mg/dL (ref 0.20–1.20)
Total Protein: 7.8 g/dL (ref 6.4–8.3)

## 2013-08-13 LAB — CBC WITH DIFFERENTIAL/PLATELET
BASO%: 0.6 % (ref 0.0–2.0)
Basophils Absolute: 0 10*3/uL (ref 0.0–0.1)
EOS%: 0.2 % (ref 0.0–7.0)
Eosinophils Absolute: 0 10*3/uL (ref 0.0–0.5)
HEMATOCRIT: 31.7 % — AB (ref 34.8–46.6)
HGB: 10 g/dL — ABNORMAL LOW (ref 11.6–15.9)
LYMPH%: 30 % (ref 14.0–49.7)
MCH: 30 pg (ref 25.1–34.0)
MCHC: 31.6 g/dL (ref 31.5–36.0)
MCV: 94.8 fL (ref 79.5–101.0)
MONO#: 0.6 10*3/uL (ref 0.1–0.9)
MONO%: 15.6 % — ABNORMAL HIGH (ref 0.0–14.0)
NEUT#: 2.1 10*3/uL (ref 1.5–6.5)
NEUT%: 53.6 % (ref 38.4–76.8)
Platelets: 66 10*3/uL — ABNORMAL LOW (ref 145–400)
RBC: 3.34 10*6/uL — AB (ref 3.70–5.45)
RDW: 17.9 % — AB (ref 11.2–14.5)
WBC: 4 10*3/uL (ref 3.9–10.3)
lymph#: 1.2 10*3/uL (ref 0.9–3.3)

## 2013-08-13 LAB — UA PROTEIN, DIPSTICK - CHCC: Protein, ur: NEGATIVE mg/dL

## 2013-08-13 MED ORDER — SODIUM CHLORIDE 0.9 % IV SOLN
15.0000 mg/kg | Freq: Once | INTRAVENOUS | Status: AC
  Administered 2013-08-13: 1150 mg via INTRAVENOUS
  Filled 2013-08-13: qty 46

## 2013-08-13 MED ORDER — SODIUM CHLORIDE 0.9 % IV SOLN
Freq: Once | INTRAVENOUS | Status: AC
  Administered 2013-08-13: 14:00:00 via INTRAVENOUS

## 2013-08-13 MED ORDER — SODIUM CHLORIDE 0.9 % IJ SOLN
10.0000 mL | INTRAMUSCULAR | Status: DC | PRN
  Administered 2013-08-13: 10 mL
  Filled 2013-08-13: qty 10

## 2013-08-13 MED ORDER — HEPARIN SOD (PORK) LOCK FLUSH 100 UNIT/ML IV SOLN
500.0000 [IU] | Freq: Once | INTRAVENOUS | Status: AC | PRN
  Administered 2013-08-13: 500 [IU]
  Filled 2013-08-13: qty 5

## 2013-08-13 NOTE — Telephone Encounter (Signed)
Per staff message and POF I have scheduled appts. Advised scheduler of appts. JMW  

## 2013-08-13 NOTE — Progress Notes (Signed)
Confirmed urine protein today is negative. OK to treat with current platelet count.

## 2013-08-13 NOTE — Progress Notes (Signed)
Hematology and Oncology Follow Up Visit  [name] [id number] 1955-12-04 58 y.o. 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 This was diagnosed in July of 2014.   Prior Therapy:  She is status post paracentesis performed on 09/17/2012 with the cytology confirmed the presence of adenocarcinoma and immunohistochemical stains suggest GYN etiology. She is also status post lumpectomy for breast cancer diagnosed 25 years ago followed by radiation and chemotherapy under the care of Dr. Sonny Padilla. She did not receive any hormonal therapy.  Current therapy: Chemotherapy utilizing carboplatin and Taxotere started on 10/01/2012. Avastin was added with cycle 4. He is here for the next cycle of chemotherapy.  Interim History: [name] is a pleasant woman it is today for a followup visit. Since her last visit, she has developed an episode of epistaxis that have resolved since. She also developed worsening depression that have been managed by her psychiatrist and was starting on a new medication. Her mood has improved since the last visit and she is doing a lot better. Her fatigue is much improved. She is not reporting any fevers or chills. Has not reported any shortness of breath or cough. She denied any pain with swallowing.  She notes loose stool but it depends on what she eats. She denied any specific pain. She denied nausea or vomiting, fever and chills.  She has reported also grade 1 neuropathy. Her appetite has improved and her weight is relatively stable.  She has not any shortness of breath or chest pain. She denied any GYN bleeding. She has not reported any recent complications or illnesses. She has not reported any increase in her abdominal pain or early satiety. She also reports epistaxis, hematochezia or melena. She  has not reported any syncope alteration of mental status. Had not report any abdominal distention. Rest of the review of systems is unremarkable.  Medications: I have reviewed the patient's current medications.  Current Outpatient Prescriptions  Medication Sig Dispense Refill  . ALPRAZolam (XANAX) 1 MG tablet Take 1 mg by mouth at bedtime as needed for anxiety.      . divalproex (DEPAKOTE ER) 500 MG 24 hr tablet Take 1,500 mg by mouth at bedtime.       . gabapentin (NEURONTIN) 100 MG capsule Take 100 mg by mouth 3 (three) times daily.      Marland Kitchen lamoTRIgine (LAMICTAL) 150 MG tablet Take 150 mg by mouth daily.      Marland Kitchen levothyroxine (SYNTHROID, LEVOTHROID) 100 MCG tablet Take 100 mcg by mouth daily before breakfast.       . lidocaine-prilocaine (EMLA) cream Apply topically as needed. Apply to port with every chemotherapy.  30 g  1  . ondansetron (ZOFRAN) 8 MG tablet Take 1 tablet (8 mg total) by mouth every 8 (eight) hours as needed for nausea or vomiting.  20 tablet  1  . PRESCRIPTION MEDICATION       . rOPINIRole (REQUIP) 1 MG tablet Take 1 mg by mouth at bedtime.      . sertraline (ZOLOFT) 100 MG tablet Take 100 mg by mouth daily.        No current facility-administered medications for this visit.     Allergies: No Known Allergies  Past Medical History, Surgical history, Social history, and Family History were reviewed and updated.   Physical Exam: Blood pressure 132/86, pulse 78, temperature 97.8  F (36.6 C), temperature source Oral, resp. rate 18, height 5\' 2"  (1.575 m), weight 157 lb 9.6 oz (71.487 kg), SpO2 100.00%.  ECOG: 1  General appearance: Awake, alert woman appeared in no distress. Her mood is very pleasant today. Head: Normocephalic, without obvious abnormality Neck: no adenopathy Lymph nodes: Cervical, supraclavicular, and axillary nodes normal. Heart:regular rate and rhythm, S1, S2 normal, no murmur, click, rub or gallop Lung:chest clear, no wheezing, rales, normal  symmetric air entry Abdomin: soft, distended and shifting dullness noted.  good bowel sounds without any shifting dullness. EXT:no erythema, induration, or nodules Skin: No rashes or lesions.  Lab Results: Lab Results  Component Value Date   WBC 4.0 08/13/2013   HGB 10.0* 08/13/2013   HCT 31.7* 08/13/2013   MCV 94.8 08/13/2013   PLT 66* 08/13/2013     Chemistry      Component Value Date/Time   NA 141 08/13/2013 1213   NA 136 07/03/2012 1359   K 4.3 08/13/2013 1213   K 4.1 07/03/2012 1359   CL 103 07/03/2012 1359   CO2 28 08/13/2013 1213   CO2 30 07/03/2012 1359   BUN 18.8 08/13/2013 1213   BUN 9 07/03/2012 1359   CREATININE 0.8 08/13/2013 1213   CREATININE 0.7 07/03/2012 1359      Component Value Date/Time   CALCIUM 9.5 08/13/2013 1213   CALCIUM 9.6 07/03/2012 1359   ALKPHOS 60 08/13/2013 1213   ALKPHOS 80 07/03/2012 1359   AST 18 08/13/2013 1213   AST 42* 07/03/2012 1359   ALT 8 08/13/2013 1213   ALT 30 07/03/2012 1359   BILITOT 0.35 08/13/2013 1213   BILITOT 0.5 07/03/2012 1359     Results for [name] (MRN [id number]) as of 08/13/2013 12:59  Ref. Range 06/25/2013 11:23 07/16/2013 11:28 08/03/2013 15:26  CA 125 Latest Range: 0.0-30.2 U/mL 69.2 (H) 57.8 (H) 45.4 (H)    Impression and Plan:  57 year old woman with the following issues:  1. Peritoneal carcinomatosis with omental involvement. The pathology confirmed the presence of adenocarcinoma of likely GYN etiology. Her CA 125 was elevated at 333.5 on 09/19/2012. She has been on systemic chemotherapy with carboplatin and Taxotere since August of 2014 and her tumor marker have plateaued around 50 or so. Her PET CT scan in May of 2015 showed relatively stable disease. Given the cumulative toxicities associated with chemotherapy as well as her recent epistaxis as well as her overall stable disease I will eliminated the carboplatin and Taxotere chemotherapy and will continue with Avastin as a single agent. Avastin will be given every 3 weeks  and I will repeat her PET scan in August of 2015.  2. Neutropenia prophylaxis: This will not be needed starting this cycle of therapy.  3. IV access: She had a Port-A-Cath inserted without any complications.  4. Thrombocytopenia: The cuff to treat her will be 70,000.   5. Epistaxis: Likely related to her thrombocytopenia which have resolved at this time.  6. Psychosocial support: Her mood is stable at this point and she continued to follow with her psychiatrist.  7. Genetic counseling: She does have a rather strong maternal family history of early breast cancer as well as a personal history of breast cancer now she is developing possibly another cancer including omental carcinomatosis.   8. The followup: She will followup in 3 weeks for her next cycle of chemotherapy.     [facility], MD 6/25/201512:53 PM

## 2013-08-13 NOTE — Patient Instructions (Addendum)
[name] Discharge Instructions for Patients Receiving Chemotherapy  Today you received the following chemotherapy agent: Avastin  To help prevent nausea and vomiting after your treatment, we encourage you to take your nausea medications as directed:  Zofran 8 mg every 8 hours as needed  If you develop nausea and vomiting that is not controlled by your nausea medication, call the clinic.    BELOW ARE SYMPTOMS THAT SHOULD BE REPORTED IMMEDIATELY:  *FEVER GREATER THAN 100.5 F  *CHILLS WITH OR WITHOUT FEVER  NAUSEA AND VOMITING THAT IS NOT CONTROLLED WITH YOUR NAUSEA MEDICATION  *UNUSUAL SHORTNESS OF BREATH  *UNUSUAL BRUISING OR BLEEDING  TENDERNESS IN MOUTH AND THROAT WITH OR WITHOUT PRESENCE OF ULCERS  *URINARY PROBLEMS  *BOWEL PROBLEMS  UNUSUAL RASH Items with * indicate a potential emergency and should be followed up as soon as possible.  Feel free to call the clinic should you have any questions or concerns. The clinic phone number is [phone].  It has been a pleasure to serve you today!

## 2013-08-13 NOTE — Progress Notes (Signed)
@  1525-patient returned to infusion area reporting "I just don't feel right. My vision in my right eye seems more blurred". No other neuro symptoms noted-grips equal. Alert and oriented. Blood sugar was normal today and she ate while in fusion area. BP stable and not tachy. Pupils equal and reactive to light. Escorted patient to recliner chair and notified Dr. Alen Blew, who ordered to observe her for 30 minutes and see how she feels. He does not feel she needs IV fluids at this time. Instructed patient let nurse know if her vision continues to worsen or any other changes in how she feels.

## 2013-08-13 NOTE — Progress Notes (Addendum)
@  1605 Reports her vision is back to baseline. She just feels "a little woozy". Up with assistance to bathroom without difficulty. Gait steady. MD notified. OK to discharge home if patient agrees. Taken to lobby in wheelchair.

## 2013-08-13 NOTE — Telephone Encounter (Signed)
Gave pt appt for lab and MD dfotr June July and August , emailed Sharyn Lull regarding chemo

## 2013-08-14 ENCOUNTER — Ambulatory Visit: Payer: BC Managed Care – PPO

## 2013-08-14 LAB — CA 125: CA 125: 50.7 U/mL — ABNORMAL HIGH (ref 0.0–30.2)

## 2013-08-15 ENCOUNTER — Encounter: Payer: Self-pay | Admitting: Oncology

## 2013-08-17 ENCOUNTER — Telehealth: Payer: Self-pay | Admitting: *Deleted

## 2013-08-17 NOTE — Telephone Encounter (Signed)
Patient calling to report very loose stools x 2 today. States she saw approximately 2 tsp of bright red blood in toilet after BM. States she has diphen/atropine at home, but has not taken it.  Instructed her to take the medicine, according to the directions on the bottle. Should she have more bleeding, she should report to the E.R. Will call and check on patient tomorrow. Note to dr Hazeline Junker desk.

## 2013-08-19 ENCOUNTER — Encounter: Payer: Self-pay | Admitting: *Deleted

## 2013-08-19 NOTE — Progress Notes (Signed)
On 08/18/13 lmoam for patient to call me, re: blood in her stool, if it had resolved. Patient returned call today, stating she has not seen any more blood in her stool. Note to dr Hazeline Junker desk.

## 2013-08-20 ENCOUNTER — Telehealth: Payer: Self-pay | Admitting: Oncology

## 2013-08-20 NOTE — Telephone Encounter (Signed)
Called pt and left message regarding lab,md and chemo for July and August 2015 mailed appt

## 2013-08-23 ENCOUNTER — Encounter: Payer: Self-pay | Admitting: Oncology

## 2013-08-24 ENCOUNTER — Encounter: Payer: Self-pay | Admitting: Oncology

## 2013-08-27 ENCOUNTER — Encounter: Payer: Self-pay | Admitting: Oncology

## 2013-08-27 ENCOUNTER — Telehealth: Payer: Self-pay | Admitting: *Deleted

## 2013-08-27 NOTE — Telephone Encounter (Signed)
Reviewed pt's email message on [employer].  Attempted to call pt back unsuccessfully.  Left message on voice mail asking pt to call triage nurse back.

## 2013-08-28 ENCOUNTER — Telehealth: Payer: Self-pay | Admitting: *Deleted

## 2013-08-28 NOTE — Telephone Encounter (Signed)
Reviewed pt's email message from [facility] 08/27/13.  Dr. Alen Blew reviewed pt's message.  Called pt and left message requesting a return call from pt on 08/27/13. Pt returned nurse's call from 08/27/13.  Informed pt that Dr. Alen Blew did not think pt's pain is related to chemo.  MD recommended pt to follow up with her primary, and pt can take Tylenol or Ibuprofen  OTC to help with mild pain.  Pt voiced understanding.

## 2013-09-03 ENCOUNTER — Ambulatory Visit (HOSPITAL_BASED_OUTPATIENT_CLINIC_OR_DEPARTMENT_OTHER): Payer: BC Managed Care – PPO | Admitting: Oncology

## 2013-09-03 ENCOUNTER — Ambulatory Visit (HOSPITAL_BASED_OUTPATIENT_CLINIC_OR_DEPARTMENT_OTHER): Payer: BC Managed Care – PPO

## 2013-09-03 ENCOUNTER — Encounter: Payer: Self-pay | Admitting: Oncology

## 2013-09-03 ENCOUNTER — Telehealth: Payer: Self-pay | Admitting: Oncology

## 2013-09-03 ENCOUNTER — Other Ambulatory Visit (HOSPITAL_BASED_OUTPATIENT_CLINIC_OR_DEPARTMENT_OTHER): Payer: BC Managed Care – PPO

## 2013-09-03 VITALS — BP 123/68 | HR 70 | Temp 97.9°F | Resp 18 | Ht 62.0 in | Wt 158.1 lb

## 2013-09-03 VITALS — BP 126/83

## 2013-09-03 DIAGNOSIS — C801 Malignant (primary) neoplasm, unspecified: Secondary | ICD-10-CM

## 2013-09-03 DIAGNOSIS — C786 Secondary malignant neoplasm of retroperitoneum and peritoneum: Secondary | ICD-10-CM

## 2013-09-03 DIAGNOSIS — Z5112 Encounter for antineoplastic immunotherapy: Secondary | ICD-10-CM

## 2013-09-03 DIAGNOSIS — Z853 Personal history of malignant neoplasm of breast: Secondary | ICD-10-CM

## 2013-09-03 DIAGNOSIS — C50919 Malignant neoplasm of unspecified site of unspecified female breast: Secondary | ICD-10-CM

## 2013-09-03 DIAGNOSIS — D696 Thrombocytopenia, unspecified: Secondary | ICD-10-CM

## 2013-09-03 DIAGNOSIS — R188 Other ascites: Secondary | ICD-10-CM

## 2013-09-03 LAB — COMPREHENSIVE METABOLIC PANEL (CC13)
ALK PHOS: 59 U/L (ref 40–150)
ALT: 7 U/L (ref 0–55)
AST: 15 U/L (ref 5–34)
Albumin: 3.5 g/dL (ref 3.5–5.0)
Anion Gap: 9 mEq/L (ref 3–11)
BILIRUBIN TOTAL: 0.33 mg/dL (ref 0.20–1.20)
BUN: 14.1 mg/dL (ref 7.0–26.0)
CO2: 27 mEq/L (ref 22–29)
Calcium: 10 mg/dL (ref 8.4–10.4)
Chloride: 105 mEq/L (ref 98–109)
Creatinine: 0.8 mg/dL (ref 0.6–1.1)
GLUCOSE: 99 mg/dL (ref 70–140)
Potassium: 4.1 mEq/L (ref 3.5–5.1)
SODIUM: 141 meq/L (ref 136–145)
TOTAL PROTEIN: 7.9 g/dL (ref 6.4–8.3)

## 2013-09-03 LAB — CBC WITH DIFFERENTIAL/PLATELET
BASO%: 0.4 % (ref 0.0–2.0)
Basophils Absolute: 0 10*3/uL (ref 0.0–0.1)
EOS%: 2.2 % (ref 0.0–7.0)
Eosinophils Absolute: 0.1 10*3/uL (ref 0.0–0.5)
HEMATOCRIT: 36.2 % (ref 34.8–46.6)
HGB: 11.3 g/dL — ABNORMAL LOW (ref 11.6–15.9)
LYMPH%: 32.6 % (ref 14.0–49.7)
MCH: 29.9 pg (ref 25.1–34.0)
MCHC: 31.1 g/dL — AB (ref 31.5–36.0)
MCV: 95.9 fL (ref 79.5–101.0)
MONO#: 0.4 10*3/uL (ref 0.1–0.9)
MONO%: 10.2 % (ref 0.0–14.0)
NEUT#: 1.9 10*3/uL (ref 1.5–6.5)
NEUT%: 54.6 % (ref 38.4–76.8)
PLATELETS: 64 10*3/uL — AB (ref 145–400)
RBC: 3.78 10*6/uL (ref 3.70–5.45)
RDW: 16.7 % — ABNORMAL HIGH (ref 11.2–14.5)
WBC: 3.5 10*3/uL — ABNORMAL LOW (ref 3.9–10.3)
lymph#: 1.1 10*3/uL (ref 0.9–3.3)

## 2013-09-03 LAB — CA 125: CA 125: 41.9 U/mL — ABNORMAL HIGH (ref 0.0–30.2)

## 2013-09-03 MED ORDER — SODIUM CHLORIDE 0.9 % IJ SOLN
10.0000 mL | INTRAMUSCULAR | Status: DC | PRN
  Administered 2013-09-03: 10 mL
  Filled 2013-09-03: qty 10

## 2013-09-03 MED ORDER — BEVACIZUMAB CHEMO INJECTION 400 MG/16ML
15.0000 mg/kg | Freq: Once | INTRAVENOUS | Status: AC
  Administered 2013-09-03: 1150 mg via INTRAVENOUS
  Filled 2013-09-03: qty 46

## 2013-09-03 MED ORDER — SODIUM CHLORIDE 0.9 % IV SOLN
Freq: Once | INTRAVENOUS | Status: AC
  Administered 2013-09-03: 11:00:00 via INTRAVENOUS

## 2013-09-03 MED ORDER — HEPARIN SOD (PORK) LOCK FLUSH 100 UNIT/ML IV SOLN
500.0000 [IU] | Freq: Once | INTRAVENOUS | Status: AC | PRN
  Administered 2013-09-03: 500 [IU]
  Filled 2013-09-03: qty 5

## 2013-09-03 NOTE — Patient Instructions (Signed)
[facility] Discharge Instructions for Patients Receiving Chemotherapy  Today you received the following chemotherapy agents; Avastin.   To help prevent nausea and vomiting after your treatment, we encourage you to take your nausea medication as directed.    If you develop nausea and vomiting that is not controlled by your nausea medication, call the clinic.   BELOW ARE SYMPTOMS THAT SHOULD BE REPORTED IMMEDIATELY:  *FEVER GREATER THAN 100.5 F  *CHILLS WITH OR WITHOUT FEVER  NAUSEA AND VOMITING THAT IS NOT CONTROLLED WITH YOUR NAUSEA MEDICATION  *UNUSUAL SHORTNESS OF BREATH  *UNUSUAL BRUISING OR BLEEDING  TENDERNESS IN MOUTH AND THROAT WITH OR WITHOUT PRESENCE OF ULCERS  *URINARY PROBLEMS  *BOWEL PROBLEMS  UNUSUAL RASH Items with * indicate a potential emergency and should be followed up as soon as possible.  Feel free to call the clinic you have any questions or concerns. The clinic phone number is [phone].    

## 2013-09-03 NOTE — Telephone Encounter (Signed)
[facility]va dn printed appt sched and avs for pt for Aug....sed adjusted tx

## 2013-09-03 NOTE — Progress Notes (Signed)
Platelet count 64. Ok to treat per Dr. Alen Blew

## 2013-09-03 NOTE — Progress Notes (Signed)
Hematology and Oncology Follow Up Visit  [name] [id number] 30-Sep-1955 58 y.o. 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 This was diagnosed in July of 2014.   Prior Therapy:  She is status post paracentesis performed on 09/17/2012 with the cytology confirmed the presence of adenocarcinoma and immunohistochemical stains suggest GYN etiology. She is also status post lumpectomy for breast cancer diagnosed 25 years ago followed by radiation and chemotherapy under the care of Dr. Sonny Padilla. She did not receive any hormonal therapy.  Current therapy: Chemotherapy utilizing carboplatin and Taxotere started on 10/01/2012. Avastin was added with cycle 4. He is here for the next cycle of chemotherapy. She is currently on a Avastin alone after chemotherapy was discontinued in June of 2015.  Interim History: Mrs. [name] is a pleasant woman it is today for a followup visit. Since her last visit, reports more fatigue and tiredness. She did not report any nausea or vomiting or exertional dyspnea but reveals that her exercise tolerance have declined. This could be attributed to her antidepressants which he is being weaned off of it. Her mood has improved since the last visit and she is doing a lot better. She is not reporting any fevers or chills. Has not reported any shortness of breath or cough. She denied any pain with swallowing.  She notes loose stool but it depends on what she eats. She denied any specific pain. She denied nausea or vomiting, fever and chills.  She has reported also grade 1 neuropathy. Her appetite has improved and her weight is relatively stable.  She has not any shortness of breath or chest pain. She denied any GYN bleeding. Her epistaxis has resolved. She has not reported any recent complications or  illnesses. She has not reported any increase in her abdominal pain or early satiety. She also reports epistaxis, hematochezia or melena. She has not reported any syncope alteration of mental status. Had not report any abdominal distention. Rest of the review of systems is unremarkable.  Medications: I have reviewed the patient's current medications.  Current Outpatient Prescriptions  Medication Sig Dispense Refill  . ALPRAZolam (XANAX) 1 MG tablet Take 1 mg by mouth at bedtime as needed for anxiety.      . divalproex (DEPAKOTE ER) 500 MG 24 hr tablet Take 1,500 mg by mouth at bedtime.       . gabapentin (NEURONTIN) 100 MG capsule Take 100 mg by mouth 3 (three) times daily.      Marland Kitchen lamoTRIgine (LAMICTAL) 150 MG tablet Take 150 mg by mouth daily.      Marland Kitchen levothyroxine (SYNTHROID, LEVOTHROID) 100 MCG tablet Take 100 mcg by mouth daily before breakfast.       . lidocaine-prilocaine (EMLA) cream Apply topically as needed. Apply to port with every chemotherapy.  30 g  1  . ondansetron (ZOFRAN) 8 MG tablet Take 1 tablet (8 mg total) by mouth every 8 (eight) hours as needed for nausea or vomiting.  20 tablet  1  . PRESCRIPTION MEDICATION       . rOPINIRole (REQUIP) 1 MG tablet Take 1 mg by mouth at bedtime.      . sertraline (ZOLOFT) 100 MG tablet Take 100 mg by mouth daily.        No current facility-administered medications for this visit.     Allergies: No Known Allergies  Past Medical  History, Surgical history, Social history, and Family History were reviewed and updated.   Physical Exam: Blood pressure 123/68, pulse 70, temperature 97.9 F (36.6 C), temperature source Oral, resp. rate 18, height 5\' 2"  (1.575 m), weight 158 lb 2 oz (71.725 kg).  ECOG: 1  General appearance: Awake, alert woman appeared in no distress. Her mood is stable with appropriate affect. Head: Normocephalic, without obvious abnormality Neck: no adenopathy Lymph nodes: Cervical, supraclavicular, and axillary nodes  normal. Heart:regular rate and rhythm, S1, S2 normal, no murmur, click, rub or gallop Lung:chest clear, no wheezing, rales, normal symmetric air entry Abdomin: soft, distended and shifting dullness noted.  good bowel sounds without any shifting dullness. EXT:no erythema, induration, or nodules Skin: No rashes or lesions.  Lab Results: Lab Results  Component Value Date   WBC 3.5* 09/03/2013   HGB 11.3* 09/03/2013   HCT 36.2 09/03/2013   MCV 95.9 09/03/2013   PLT 64* 09/03/2013     Chemistry      Component Value Date/Time   NA 141 09/03/2013 0939   NA 136 07/03/2012 1359   K 4.1 09/03/2013 0939   K 4.1 07/03/2012 1359   CL 103 07/03/2012 1359   CO2 27 09/03/2013 0939   CO2 30 07/03/2012 1359   BUN 14.1 09/03/2013 0939   BUN 9 07/03/2012 1359   CREATININE 0.8 09/03/2013 0939   CREATININE 0.7 07/03/2012 1359      Component Value Date/Time   CALCIUM 10.0 09/03/2013 0939   CALCIUM 9.6 07/03/2012 1359   ALKPHOS 59 09/03/2013 0939   ALKPHOS 80 07/03/2012 1359   AST 15 09/03/2013 0939   AST 42* 07/03/2012 1359   ALT 7 09/03/2013 0939   ALT 30 07/03/2012 1359   BILITOT 0.33 09/03/2013 0939   BILITOT 0.5 07/03/2012 1359      Results for [name], [name] (MRN [id number]) as of 09/03/2013 10:05  Ref. Range 07/16/2013 11:28 08/03/2013 15:26 08/13/2013 12:16  CA 125 Latest Range: 0.0-30.2 U/mL 57.8 (H) 45.4 (H) 50.7 (H)    Impression and Plan:  58 year old woman with the following issues:  1. Peritoneal carcinomatosis with omental involvement. The pathology confirmed the presence of adenocarcinoma of likely GYN etiology. Her CA 125 was elevated at 333.5 on 09/19/2012. Systemic chemotherapy have been on hold due to pancytopenia and acute related toxicity and currently on maintenance Avastin. The plan is to continue with the current regimen and repeat imaging studies over the next visit. He is ready to proceed with Avastin today.  2. Neutropenia prophylaxis: This will not be needed starting this cycle of  therapy.  3. IV access: She had a Port-A-Cath inserted without any complications.  4. Thrombocytopenia: Her platelets are still stable without any active bleeding.  5. Epistaxis: Likely related to her thrombocytopenia which have resolved at this time.  6. Psychosocial support: Her mood is stable at this point and she continued to follow with her psychiatrist.  7. The followup: She will followup in 3 weeks for her next cycle of chemotherapy after a PET/CT.    [facility], MD 7/16/201510:23 AM

## 2013-09-22 ENCOUNTER — Encounter (HOSPITAL_COMMUNITY)
Admission: RE | Admit: 2013-09-22 | Discharge: 2013-09-22 | Disposition: A | Discharge status: Home / Self Care | Payer: BC Managed Care – PPO | Source: Ambulatory Visit | Attending: Oncology | Admitting: Oncology

## 2013-09-22 ENCOUNTER — Encounter (HOSPITAL_COMMUNITY): Payer: Self-pay

## 2013-09-22 ENCOUNTER — Other Ambulatory Visit (HOSPITAL_BASED_OUTPATIENT_CLINIC_OR_DEPARTMENT_OTHER): Payer: BC Managed Care – PPO

## 2013-09-22 DIAGNOSIS — C786 Secondary malignant neoplasm of retroperitoneum and peritoneum: Secondary | ICD-10-CM | POA: Insufficient documentation

## 2013-09-22 DIAGNOSIS — C801 Malignant (primary) neoplasm, unspecified: Secondary | ICD-10-CM

## 2013-09-22 LAB — CBC WITH DIFFERENTIAL/PLATELET
BASO%: 0.8 % (ref 0.0–2.0)
BASOS ABS: 0 10*3/uL (ref 0.0–0.1)
EOS ABS: 0.1 10*3/uL (ref 0.0–0.5)
EOS%: 3.9 % (ref 0.0–7.0)
HEMATOCRIT: 38.3 % (ref 34.8–46.6)
HEMOGLOBIN: 12 g/dL (ref 11.6–15.9)
LYMPH%: 38.5 % (ref 14.0–49.7)
MCH: 29.6 pg (ref 25.1–34.0)
MCHC: 31.4 g/dL — ABNORMAL LOW (ref 31.5–36.0)
MCV: 94.2 fL (ref 79.5–101.0)
MONO#: 0.5 10*3/uL (ref 0.1–0.9)
MONO%: 14 % (ref 0.0–14.0)
NEUT#: 1.4 10*3/uL — ABNORMAL LOW (ref 1.5–6.5)
NEUT%: 42.8 % (ref 38.4–76.8)
Platelets: 74 10*3/uL — ABNORMAL LOW (ref 145–400)
RBC: 4.06 10*6/uL (ref 3.70–5.45)
RDW: 15.9 % — AB (ref 11.2–14.5)
WBC: 3.3 10*3/uL — AB (ref 3.9–10.3)
lymph#: 1.3 10*3/uL (ref 0.9–3.3)

## 2013-09-22 LAB — COMPREHENSIVE METABOLIC PANEL (CC13)
ALBUMIN: 3.6 g/dL (ref 3.5–5.0)
ALT: <6 U/L (ref 0–55)
ANION GAP: 7 meq/L (ref 3–11)
AST: 18 U/L (ref 5–34)
Alkaline Phosphatase: 48 U/L (ref 40–150)
BUN: 14.7 mg/dL (ref 7.0–26.0)
CHLORIDE: 105 meq/L (ref 98–109)
CO2: 28 mEq/L (ref 22–29)
CREATININE: 0.7 mg/dL (ref 0.6–1.1)
Calcium: 9.8 mg/dL (ref 8.4–10.4)
GLUCOSE: 83 mg/dL (ref 70–140)
Potassium: 4.6 mEq/L (ref 3.5–5.1)
Sodium: 141 mEq/L (ref 136–145)
Total Bilirubin: 0.36 mg/dL (ref 0.20–1.20)
Total Protein: 8.3 g/dL (ref 6.4–8.3)

## 2013-09-22 LAB — GLUCOSE, CAPILLARY: GLUCOSE-CAPILLARY: 83 mg/dL (ref 70–99)

## 2013-09-22 LAB — CA 125: CA 125: 39.3 U/mL — ABNORMAL HIGH (ref 0.0–30.2)

## 2013-09-22 MED ORDER — FLUDEOXYGLUCOSE F - 18 (FDG) INJECTION: 9.0000 | Freq: Once | INTRAVENOUS | Status: AC | PRN

## 2013-09-23 ENCOUNTER — Ambulatory Visit (HOSPITAL_BASED_OUTPATIENT_CLINIC_OR_DEPARTMENT_OTHER): Payer: BC Managed Care – PPO | Admitting: Oncology

## 2013-09-23 ENCOUNTER — Ambulatory Visit (HOSPITAL_BASED_OUTPATIENT_CLINIC_OR_DEPARTMENT_OTHER): Payer: BC Managed Care – PPO

## 2013-09-23 ENCOUNTER — Encounter: Payer: Self-pay | Admitting: Oncology

## 2013-09-23 ENCOUNTER — Telehealth: Payer: Self-pay | Admitting: Oncology

## 2013-09-23 VITALS — BP 119/61 | HR 79 | Temp 98.1°F | Resp 18 | Ht 62.0 in | Wt 155.3 lb

## 2013-09-23 DIAGNOSIS — C786 Secondary malignant neoplasm of retroperitoneum and peritoneum: Secondary | ICD-10-CM

## 2013-09-23 DIAGNOSIS — C569 Malignant neoplasm of unspecified ovary: Secondary | ICD-10-CM

## 2013-09-23 DIAGNOSIS — D696 Thrombocytopenia, unspecified: Secondary | ICD-10-CM

## 2013-09-23 DIAGNOSIS — Z5112 Encounter for antineoplastic immunotherapy: Secondary | ICD-10-CM

## 2013-09-23 DIAGNOSIS — C801 Malignant (primary) neoplasm, unspecified: Secondary | ICD-10-CM

## 2013-09-23 MED ORDER — SODIUM CHLORIDE 0.9 % IJ SOLN
10.0000 mL | INTRAMUSCULAR | Status: DC | PRN
  Administered 2013-09-23: 10 mL
  Filled 2013-09-23: qty 10

## 2013-09-23 MED ORDER — HEPARIN SOD (PORK) LOCK FLUSH 100 UNIT/ML IV SOLN
500.0000 [IU] | Freq: Once | INTRAVENOUS | Status: AC | PRN
  Administered 2013-09-23: 500 [IU]
  Filled 2013-09-23: qty 5

## 2013-09-23 MED ORDER — SODIUM CHLORIDE 0.9 % IV SOLN
Freq: Once | INTRAVENOUS | Status: AC
  Administered 2013-09-23: 13:00:00 via INTRAVENOUS

## 2013-09-23 MED ORDER — SODIUM CHLORIDE 0.9 % IV SOLN
15.0000 mg/kg | Freq: Once | INTRAVENOUS | Status: AC
  Administered 2013-09-23: 1150 mg via INTRAVENOUS
  Filled 2013-09-23: qty 46

## 2013-09-23 NOTE — Patient Instructions (Signed)
[facility] Discharge Instructions for Patients Receiving Chemotherapy  Today you received the following chemotherapy agent: Avastin  To help prevent nausea and vomiting after your treatment, we encourage you to take your nausea medications as directed:  Zofran 8 mg every 8 hours as needed  If you develop nausea and vomiting that is not controlled by your nausea medication, call the clinic.    BELOW ARE SYMPTOMS THAT SHOULD BE REPORTED IMMEDIATELY:  *FEVER GREATER THAN 100.5 F  *CHILLS WITH OR WITHOUT FEVER  NAUSEA AND VOMITING THAT IS NOT CONTROLLED WITH YOUR NAUSEA MEDICATION  *UNUSUAL SHORTNESS OF BREATH  *UNUSUAL BRUISING OR BLEEDING  TENDERNESS IN MOUTH AND THROAT WITH OR WITHOUT PRESENCE OF ULCERS  *URINARY PROBLEMS  *BOWEL PROBLEMS  UNUSUAL RASH Items with * indicate a potential emergency and should be followed up as soon as possible.  Feel free to call the clinic should you have any questions or concerns. The clinic phone number is [phone].  It has been a pleasure to serve you today!

## 2013-09-23 NOTE — Progress Notes (Signed)
OK to treat with avastin today despite platelet count of 74K/  VO Dr. Sinclair Grooms RN (phone order)

## 2013-09-23 NOTE — Progress Notes (Signed)
Hematology and Oncology Follow Up Visit  [name] [id number] 1956-01-20 58 y.o. 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 This was diagnosed in July of 2014.   Prior Therapy:  She is status post paracentesis performed on 09/17/2012 with the cytology confirmed the presence of adenocarcinoma and immunohistochemical stains suggest GYN etiology. She is also status post lumpectomy for breast cancer diagnosed 25 years ago followed by radiation and chemotherapy under the care of Dr. Sonny Dandy. She did not receive any hormonal therapy. Chemotherapy utilizing carboplatin and Taxotere started on 10/01/2012. Avastin was added with cycle 4. This was discontinued in June of 2015.  Current therapy:  She is currently on a Avastin alone after chemotherapy was discontinued in June of 2015.  Interim History: [name] presents today for a followup visit with her husband. Since her last visit, she has been doing well without any new complaints. She continues to have mild fatigue but have not really changed dramatically. She has not reported any complications related to a Avastin. She did not report any nausea or vomiting or exertional dyspnea but reveals that her exercise tolerance have declined. This could be attributed to her antidepressants. Her mood has improved since the last visit and she is doing a lot better. She is not reporting any fevers or chills. Has not reported any shortness of breath or cough. She denied any pain with swallowing.  She denied any specific pain. She denied nausea  fever and chills.  She has reported also grade 1 neuropathy. Her appetite has improved and her weight is relatively stable.  She has not any shortness of breath or chest pain. She denied any GYN bleeding. Her epistaxis has resolved. She has  not reported any recent complications or illnesses. She has not reported any increase in her abdominal pain or early satiety. She also reports no hematochezia or melena. She has not reported any syncope alteration of mental status. Had not report any abdominal distention. Rest of the review of systems is unremarkable.  Medications: I have reviewed the patient's current medications.  Current Outpatient Prescriptions  Medication Sig Dispense Refill  . ALPRAZolam (XANAX) 1 MG tablet Take 1 mg by mouth at bedtime as needed for anxiety.      . divalproex (DEPAKOTE ER) 500 MG 24 hr tablet Take 1,500 mg by mouth at bedtime.       . gabapentin (NEURONTIN) 100 MG capsule Take 100 mg by mouth 3 (three) times daily.      Marland Kitchen lamoTRIgine (LAMICTAL) 150 MG tablet Take 175 mg by mouth daily.       Marland Kitchen levothyroxine (SYNTHROID, LEVOTHROID) 100 MCG tablet Take 100 mcg by mouth daily before breakfast.       . lidocaine-prilocaine (EMLA) cream Apply topically as needed. Apply to port with every chemotherapy.  30 g  1  . ondansetron (ZOFRAN) 8 MG tablet Take 1 tablet (8 mg total) by mouth every 8 (eight) hours as needed for nausea or vomiting.  20 tablet  1  . PRESCRIPTION MEDICATION       . rOPINIRole (REQUIP) 1 MG tablet Take 1 mg by mouth at bedtime.       No current facility-administered medications for this visit.     Allergies: No Known Allergies  Past Medical History, Surgical history, Social history, and Family History were reviewed and updated.  Physical Exam: Blood pressure 119/61, pulse 79, temperature 98.1 F (36.7 C), temperature source Oral, resp. rate 18, height 5\' 2"  (1.575 m), weight 155 lb 4.8 oz (70.444 kg), SpO2 96.00%.  ECOG: 1  General appearance: Awake, alert woman appeared in no distress. Her mood is stable with appropriate affect. Head: Normocephalic, without obvious abnormality Neck: no adenopathy Lymph nodes: Cervical, supraclavicular, and axillary nodes normal. Heart:regular  rate and rhythm, S1, S2 normal, no murmur, click, rub or gallop Lung:chest clear, no wheezing, rales, normal symmetric air entry Abdomin: soft, distended and shifting dullness noted.  good bowel sounds without any shifting dullness. Nontender on palpation. EXT:no erythema, induration, or nodules. She had good pulses and capillary refill. Skin: Slight discoloration noted on her left foot.  Lab Results: Lab Results  Component Value Date   WBC 3.3* 09/22/2013   HGB 12.0 09/22/2013   HCT 38.3 09/22/2013   MCV 94.2 09/22/2013   PLT 74* 09/22/2013     Chemistry      Component Value Date/Time   NA 141 09/22/2013 0911   NA 136 07/03/2012 1359   K 4.6 09/22/2013 0911   K 4.1 07/03/2012 1359   CL 103 07/03/2012 1359   CO2 28 09/22/2013 0911   CO2 30 07/03/2012 1359   BUN 14.7 09/22/2013 0911   BUN 9 07/03/2012 1359   CREATININE 0.7 09/22/2013 0911   CREATININE 0.7 07/03/2012 1359      Component Value Date/Time   CALCIUM 9.8 09/22/2013 0911   CALCIUM 9.6 07/03/2012 1359   ALKPHOS 48 09/22/2013 0911   ALKPHOS 80 07/03/2012 1359   AST 18 09/22/2013 0911   AST 42* 07/03/2012 1359   ALT <6 09/22/2013 0911   ALT 30 07/03/2012 1359   BILITOT 0.36 09/22/2013 0911   BILITOT 0.5 07/03/2012 1359      Results for Julie, Padilla (MRN [id number]) as of 09/23/2013 12:48  Ref. Range 09/03/2013 09:39 09/22/2013 09:11  CA 125 Latest Range: 0.0-30.2 U/mL 41.9 (H) 39.3 (H)   EXAM:  NUCLEAR MEDICINE PET SKULL BASE TO THIGH  TECHNIQUE:  9.0 mCi F-18 FDG was injected intravenously. Full-ring PET imaging  was performed from the skull base to thigh after the radiotracer. CT  data was obtained and used for attenuation correction and anatomic  localization.  FASTING BLOOD GLUCOSE: Value: 83 mg/dl  COMPARISON: 06/23/2013  FINDINGS:  NECK  No hypermetabolic lymph nodes in the neck.  CHEST  No hypermetabolic mediastinal or hilar nodes. No suspicious  pulmonary nodules on the CT scan. Calcified atherosclerotic changes  noted involving the  LAD coronary artery. Postoperative changes  involving the right breast and right axilla noted. There is a right  chest wall port a catheter with tip situated at the cavoatrial  junction.  ABDOMEN/PELVIS  No abnormal hypermetabolic activity within the liver, pancreas,  adrenal glands, or spleen. No hypermetabolic lymph nodes in the  abdomen or pelvis. Again noted is evidence of peritoneal metastasis.  Index lesion along the inferior margin of the right hepatic lobe  measures 5.4 x 4.0 with an SUV max equal to 5.1. Previously 6.3 x  4.0 and SUV max of 4.7. The second index lesion along the  undersurface of the ventral abdominal wall measures 8.4 x 2.4 cm and  has an SUV max equal to 4.4. Previously this measured 9.7 x 2.5 and  had an SUV max of 5.0. There is trace ascites within the dependent  portion of the pelvis and along the inferior margin of  the liver.  SKELETON  No focal hypermetabolic activity to suggest skeletal metastasis.  IMPRESSION:  1. No significant change in the appearance of peritoneal disease.  2. No new or progressive disease identified. No evidence for bowel  obstruction or obstructive uropathy.   Impression and Plan:  58 year old woman with the following issues:  1. Peritoneal carcinomatosis with omental involvement. The pathology confirmed the presence of adenocarcinoma of likely GYN etiology. Her CA 125 was elevated at 333.5 on 09/19/2012. She is status post systemic chemotherapy with an excellent response utilizing carboplatin and Taxotere. This therapy was held in June of 2015 and currently on Avastin maintenance. Her PET/CT scan on 09/22/2013 was reviewed and showed stable disease. Her tumor marker continue to respond with her CA 125 is down to 39. The plan is to continue with Avastin maintenance and add chemotherapy in the future upon relapse.  2. Neutropenia prophylaxis: This will not be needed with a Avastin.  3. IV access: She had a Port-A-Cath inserted  without any complications.  4. Thrombocytopenia: Her platelets are improving since the start of chemotherapy.  5. Epistaxis: Likely related to her thrombocytopenia which have resolved at this time.  6. Psychosocial support: Her mood is stable at this point and she continued to follow with her psychiatrist.  7. The followup: She will followup in 3 weeks for her next cycle of a Avastin.    [facility], MD 8/5/201512:43 PM

## 2013-09-23 NOTE — Telephone Encounter (Signed)
gv and printed appt sched and avs for pt for Aug and Sept.....sed added tx. °

## 2013-09-24 ENCOUNTER — Ambulatory Visit: Payer: BC Managed Care – PPO

## 2013-09-30 ENCOUNTER — Other Ambulatory Visit: Payer: Self-pay | Admitting: Oncology

## 2013-09-30 DIAGNOSIS — C569 Malignant neoplasm of unspecified ovary: Secondary | ICD-10-CM

## 2013-10-09 ENCOUNTER — Encounter: Payer: Self-pay | Admitting: Internal Medicine

## 2013-10-15 ENCOUNTER — Ambulatory Visit (HOSPITAL_BASED_OUTPATIENT_CLINIC_OR_DEPARTMENT_OTHER): Payer: BC Managed Care – PPO

## 2013-10-15 ENCOUNTER — Ambulatory Visit: Payer: BC Managed Care – PPO | Admitting: Physician Assistant

## 2013-10-15 VITALS — BP 142/90 | HR 75 | Temp 97.4°F | Resp 20 | Wt 157.0 lb

## 2013-10-15 DIAGNOSIS — C801 Malignant (primary) neoplasm, unspecified: Secondary | ICD-10-CM

## 2013-10-15 DIAGNOSIS — C569 Malignant neoplasm of unspecified ovary: Secondary | ICD-10-CM

## 2013-10-15 DIAGNOSIS — C786 Secondary malignant neoplasm of retroperitoneum and peritoneum: Secondary | ICD-10-CM

## 2013-10-15 DIAGNOSIS — Z5112 Encounter for antineoplastic immunotherapy: Secondary | ICD-10-CM

## 2013-10-15 DIAGNOSIS — C50919 Malignant neoplasm of unspecified site of unspecified female breast: Secondary | ICD-10-CM

## 2013-10-15 LAB — COMPREHENSIVE METABOLIC PANEL (CC13)
ALBUMIN: 3.4 g/dL — AB (ref 3.5–5.0)
ALK PHOS: 56 U/L (ref 40–150)
ALT: 9 U/L (ref 0–55)
AST: 17 U/L (ref 5–34)
Anion Gap: 7 mEq/L (ref 3–11)
BUN: 17.9 mg/dL (ref 7.0–26.0)
CO2: 31 mEq/L — ABNORMAL HIGH (ref 22–29)
Calcium: 10 mg/dL (ref 8.4–10.4)
Chloride: 105 mEq/L (ref 98–109)
Creatinine: 0.8 mg/dL (ref 0.6–1.1)
Glucose: 96 mg/dl (ref 70–140)
POTASSIUM: 5 meq/L (ref 3.5–5.1)
Sodium: 143 mEq/L (ref 136–145)
TOTAL PROTEIN: 8.1 g/dL (ref 6.4–8.3)
Total Bilirubin: 0.35 mg/dL (ref 0.20–1.20)

## 2013-10-15 LAB — CBC WITH DIFFERENTIAL/PLATELET
BASO%: 0.4 % (ref 0.0–2.0)
Basophils Absolute: 0 10*3/uL (ref 0.0–0.1)
EOS%: 3.6 % (ref 0.0–7.0)
Eosinophils Absolute: 0.1 10*3/uL (ref 0.0–0.5)
HCT: 38.2 % (ref 34.8–46.6)
HGB: 12 g/dL (ref 11.6–15.9)
LYMPH%: 40.4 % (ref 14.0–49.7)
MCH: 29.6 pg (ref 25.1–34.0)
MCHC: 31.3 g/dL — ABNORMAL LOW (ref 31.5–36.0)
MCV: 94.4 fL (ref 79.5–101.0)
MONO#: 0.5 10*3/uL (ref 0.1–0.9)
MONO%: 13.2 % (ref 0.0–14.0)
NEUT#: 1.5 10*3/uL (ref 1.5–6.5)
NEUT%: 42.4 % (ref 38.4–76.8)
Platelets: 66 10*3/uL — ABNORMAL LOW (ref 145–400)
RBC: 4.04 10*6/uL (ref 3.70–5.45)
RDW: 15.4 % — AB (ref 11.2–14.5)
WBC: 3.5 10*3/uL — ABNORMAL LOW (ref 3.9–10.3)
lymph#: 1.4 10*3/uL (ref 0.9–3.3)

## 2013-10-15 LAB — UA PROTEIN, DIPSTICK - CHCC: Protein, ur: NEGATIVE mg/dL

## 2013-10-15 MED ORDER — HEPARIN SOD (PORK) LOCK FLUSH 100 UNIT/ML IV SOLN
500.0000 [IU] | Freq: Once | INTRAVENOUS | Status: AC | PRN
  Administered 2013-10-15: 500 [IU]
  Filled 2013-10-15: qty 5

## 2013-10-15 MED ORDER — SODIUM CHLORIDE 0.9 % IV SOLN
15.0000 mg/kg | Freq: Once | INTRAVENOUS | Status: AC
  Administered 2013-10-15: 1150 mg via INTRAVENOUS
  Filled 2013-10-15: qty 46

## 2013-10-15 MED ORDER — SODIUM CHLORIDE 0.9 % IJ SOLN
10.0000 mL | INTRAMUSCULAR | Status: DC | PRN
  Administered 2013-10-15: 10 mL
  Filled 2013-10-15: qty 10

## 2013-10-15 MED ORDER — SODIUM CHLORIDE 0.9 % IV SOLN
Freq: Once | INTRAVENOUS | Status: AC
  Administered 2013-10-15: 11:00:00 via INTRAVENOUS

## 2013-10-15 NOTE — Progress Notes (Signed)
Pt's platelet are 66 today. Pt unable to void at this time. Dr. Alen Blew informed. Verbal order granted to treat pt today with low platelets and no urine output.

## 2013-10-15 NOTE — Patient Instructions (Signed)
[facility] Discharge Instructions for Patients Receiving Chemotherapy  Today you received the following chemotherapy agents Avastin  To help prevent nausea and vomiting after your treatment, we encourage you to take your nausea medication Zofran   8 mg every 8 hours as needed by mouth. If you develop nausea and vomiting that is not controlled by your nausea medication, call the clinic.   BELOW ARE SYMPTOMS THAT SHOULD BE REPORTED IMMEDIATELY:  *FEVER GREATER THAN 100.5 F  *CHILLS WITH OR WITHOUT FEVER  NAUSEA AND VOMITING THAT IS NOT CONTROLLED WITH YOUR NAUSEA MEDICATION  *UNUSUAL SHORTNESS OF BREATH  *UNUSUAL BRUISING OR BLEEDING  TENDERNESS IN MOUTH AND THROAT WITH OR WITHOUT PRESENCE OF ULCERS  *URINARY PROBLEMS  *BOWEL PROBLEMS  UNUSUAL RASH Items with * indicate a potential emergency and should be followed up as soon as possible.  Feel free to call the clinic you have any questions or concerns. The clinic phone number is [phone].

## 2013-10-16 LAB — CA 125: CA 125: 58 U/mL — ABNORMAL HIGH (ref <35)

## 2013-10-16 LAB — CA 125(PREVIOUS METHOD): CA 125: 41.8 U/mL — ABNORMAL HIGH (ref 0.0–30.2)

## 2013-11-03 ENCOUNTER — Encounter: Payer: Self-pay | Admitting: Oncology

## 2013-11-05 ENCOUNTER — Ambulatory Visit: Payer: BC Managed Care – PPO

## 2013-11-05 ENCOUNTER — Other Ambulatory Visit: Payer: BC Managed Care – PPO

## 2013-11-05 ENCOUNTER — Other Ambulatory Visit (HOSPITAL_BASED_OUTPATIENT_CLINIC_OR_DEPARTMENT_OTHER): Payer: BC Managed Care – PPO

## 2013-11-05 ENCOUNTER — Telehealth: Payer: Self-pay | Admitting: *Deleted

## 2013-11-05 ENCOUNTER — Telehealth: Payer: Self-pay | Admitting: Oncology

## 2013-11-05 ENCOUNTER — Encounter: Payer: Self-pay | Admitting: Oncology

## 2013-11-05 ENCOUNTER — Ambulatory Visit (HOSPITAL_BASED_OUTPATIENT_CLINIC_OR_DEPARTMENT_OTHER): Payer: BC Managed Care – PPO | Admitting: Oncology

## 2013-11-05 ENCOUNTER — Ambulatory Visit (HOSPITAL_BASED_OUTPATIENT_CLINIC_OR_DEPARTMENT_OTHER): Payer: BC Managed Care – PPO

## 2013-11-05 VITALS — BP 156/84 | HR 67

## 2013-11-05 VITALS — BP 135/80 | HR 68 | Temp 98.3°F | Resp 18 | Ht 62.0 in | Wt 158.2 lb

## 2013-11-05 DIAGNOSIS — R109 Unspecified abdominal pain: Secondary | ICD-10-CM

## 2013-11-05 DIAGNOSIS — C569 Malignant neoplasm of unspecified ovary: Secondary | ICD-10-CM

## 2013-11-05 DIAGNOSIS — C786 Secondary malignant neoplasm of retroperitoneum and peritoneum: Secondary | ICD-10-CM

## 2013-11-05 DIAGNOSIS — C801 Malignant (primary) neoplasm, unspecified: Secondary | ICD-10-CM

## 2013-11-05 DIAGNOSIS — D696 Thrombocytopenia, unspecified: Secondary | ICD-10-CM

## 2013-11-05 DIAGNOSIS — Z5112 Encounter for antineoplastic immunotherapy: Secondary | ICD-10-CM

## 2013-11-05 DIAGNOSIS — C50919 Malignant neoplasm of unspecified site of unspecified female breast: Secondary | ICD-10-CM

## 2013-11-05 LAB — COMPREHENSIVE METABOLIC PANEL (CC13)
ALK PHOS: 49 U/L (ref 40–150)
ALT: <6 U/L (ref 0–55)
AST: 15 U/L (ref 5–34)
Albumin: 3.2 g/dL — ABNORMAL LOW (ref 3.5–5.0)
Anion Gap: 8 mEq/L (ref 3–11)
BILIRUBIN TOTAL: 0.34 mg/dL (ref 0.20–1.20)
BUN: 11.4 mg/dL (ref 7.0–26.0)
CO2: 26 mEq/L (ref 22–29)
Calcium: 9.3 mg/dL (ref 8.4–10.4)
Chloride: 108 mEq/L (ref 98–109)
Creatinine: 0.7 mg/dL (ref 0.6–1.1)
GLUCOSE: 83 mg/dL (ref 70–140)
POTASSIUM: 4.4 meq/L (ref 3.5–5.1)
Sodium: 142 mEq/L (ref 136–145)
Total Protein: 7.5 g/dL (ref 6.4–8.3)

## 2013-11-05 LAB — CBC WITH DIFFERENTIAL/PLATELET
BASO%: 0 % (ref 0.0–2.0)
Basophils Absolute: 0 10*3/uL (ref 0.0–0.1)
EOS ABS: 0.1 10*3/uL (ref 0.0–0.5)
EOS%: 2.8 % (ref 0.0–7.0)
HCT: 36.7 % (ref 34.8–46.6)
HEMOGLOBIN: 11.3 g/dL — AB (ref 11.6–15.9)
LYMPH%: 41.2 % (ref 14.0–49.7)
MCH: 29.4 pg (ref 25.1–34.0)
MCHC: 30.8 g/dL — ABNORMAL LOW (ref 31.5–36.0)
MCV: 95.6 fL (ref 79.5–101.0)
MONO#: 0.4 10*3/uL (ref 0.1–0.9)
MONO%: 11 % (ref 0.0–14.0)
NEUT%: 45 % (ref 38.4–76.8)
NEUTROS ABS: 1.4 10*3/uL — AB (ref 1.5–6.5)
PLATELETS: 57 10*3/uL — AB (ref 145–400)
RBC: 3.84 10*6/uL (ref 3.70–5.45)
RDW: 14.8 % — ABNORMAL HIGH (ref 11.2–14.5)
WBC: 3.2 10*3/uL — ABNORMAL LOW (ref 3.9–10.3)
lymph#: 1.3 10*3/uL (ref 0.9–3.3)

## 2013-11-05 LAB — UA PROTEIN, DIPSTICK - CHCC: PROTEIN: 100 mg/dL

## 2013-11-05 MED ORDER — HEPARIN SOD (PORK) LOCK FLUSH 100 UNIT/ML IV SOLN
500.0000 [IU] | Freq: Once | INTRAVENOUS | Status: AC | PRN
  Administered 2013-11-05: 500 [IU]
  Filled 2013-11-05: qty 5

## 2013-11-05 MED ORDER — SODIUM CHLORIDE 0.9 % IJ SOLN
10.0000 mL | INTRAMUSCULAR | Status: DC | PRN
  Administered 2013-11-05: 10 mL
  Filled 2013-11-05: qty 10

## 2013-11-05 MED ORDER — SODIUM CHLORIDE 0.9 % IJ SOLN
10.0000 mL | INTRAMUSCULAR | Status: DC | PRN
  Administered 2013-11-05: 10 mL via INTRAVENOUS
  Filled 2013-11-05: qty 10

## 2013-11-05 MED ORDER — SODIUM CHLORIDE 0.9 % IV SOLN
Freq: Once | INTRAVENOUS | Status: AC
  Administered 2013-11-05: 12:00:00 via INTRAVENOUS

## 2013-11-05 MED ORDER — SODIUM CHLORIDE 0.9 % IV SOLN
15.0000 mg/kg | Freq: Once | INTRAVENOUS | Status: AC
  Administered 2013-11-05: 1150 mg via INTRAVENOUS
  Filled 2013-11-05: qty 46

## 2013-11-05 NOTE — Telephone Encounter (Signed)
Per staff message and POF I have scheduled appts. Advised scheduler of appts. JMW  

## 2013-11-05 NOTE — Patient Instructions (Signed)

## 2013-11-05 NOTE — Progress Notes (Signed)
Noted lab report with protein 100 in urine.  And ANC =1.4 and platelets = 57k.  Call made to Dr. Alen Blew and made him aware of lab results. Ok to treat per Dr. Alen Blew.  Post Avastin VS stable with BP 156/84, pulse 67

## 2013-11-05 NOTE — Patient Instructions (Signed)
[facility] Discharge Instructions for Patients Receiving Chemotherapy  Today you received the following chemotherapy agents: Avastin.  To help prevent nausea and vomiting after your treatment, we encourage you to take your nausea medication: Zofran 8 mg every 8 hours as needed.   If you develop nausea and vomiting that is not controlled by your nausea medication, call the clinic.   BELOW ARE SYMPTOMS THAT SHOULD BE REPORTED IMMEDIATELY:  *FEVER GREATER THAN 100.5 F  *CHILLS WITH OR WITHOUT FEVER  NAUSEA AND VOMITING THAT IS NOT CONTROLLED WITH YOUR NAUSEA MEDICATION  *UNUSUAL SHORTNESS OF BREATH  *UNUSUAL BRUISING OR BLEEDING  TENDERNESS IN MOUTH AND THROAT WITH OR WITHOUT PRESENCE OF ULCERS  *URINARY PROBLEMS  *BOWEL PROBLEMS  UNUSUAL RASH Items with * indicate a potential emergency and should be followed up as soon as possible.  Feel free to call the clinic you have any questions or concerns. The clinic phone number is [phone].    

## 2013-11-05 NOTE — Telephone Encounter (Signed)
Pt confirmed labs/ov per 09/17 POF, sent msg to add chemo, gave pt AVS..Marland KitchenKJ

## 2013-11-05 NOTE — Progress Notes (Signed)
Hematology and Oncology Follow Up Visit  [name] [id number] Oct 20, 1955 58 y.o. 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 This was diagnosed in July of 2014.   Prior Therapy:  She is status post paracentesis performed on 09/17/2012 with the cytology confirmed the presence of adenocarcinoma and immunohistochemical stains suggest GYN etiology. She is also status post lumpectomy for breast cancer diagnosed 25 years ago followed by radiation and chemotherapy under the care of Dr. Sonny Padilla. She did not receive any hormonal therapy. Chemotherapy utilizing carboplatin and Taxotere started on 10/01/2012. Avastin was added with cycle 4. This was discontinued in June of 2015.  Current therapy:  She is currently on a Avastin alone after chemotherapy was discontinued in June of 2015.  Interim History: [name] presents today for a followup visit with her husband. Since her last visit, she she reports no new complaints. She continues to have some stiffness in her neck and has been evaluated by orthopedic surgery. She was prescribed physical therapy although she has done and at this time because of cost issues. She continues to have mild fatigue but have not really changed dramatically. She has not reported any complications related to a Avastin. She did not report any nausea or vomiting or exertional dyspnea but reveals that her exercise tolerance have declined. Her mood continues to improve compared to previous visits. She is not reporting any fevers or chills. Has not reported any shortness of breath or cough. She denied any pain with swallowing.  She denied any specific pain. She denied nausea  fever and chills.  She has reported also grade 1 neuropathy. Her appetite has improved and her weight is relatively stable.  She  has not any shortness of breath or chest pain. She denied any GYN bleeding. Her epistaxis has resolved. She has not reported any recent complications or illnesses. She has not reported any increase in her abdominal pain or early satiety. She also reports no hematochezia or melena. She has not reported any syncope alteration of mental status. Had not report any abdominal distention. Rest of the review of systems is unremarkable.  Medications: I have reviewed the patient's current medications.  Current Outpatient Prescriptions  Medication Sig Dispense Refill  . ALPRAZolam (XANAX) 1 MG tablet Take 1 mg by mouth at bedtime as needed for anxiety.      . divalproex (DEPAKOTE ER) 500 MG 24 hr tablet Take 1,500 mg by mouth at bedtime.       . gabapentin (NEURONTIN) 100 MG capsule TAKE ONE CAPSULE BY MOUTH 3 TIMES A DAY  90 capsule  1  . lamoTRIgine (LAMICTAL) 150 MG tablet Take 175 mg by mouth daily.       Marland Kitchen levothyroxine (SYNTHROID, LEVOTHROID) 100 MCG tablet Take 100 mcg by mouth daily before breakfast.       . lidocaine-prilocaine (EMLA) cream Apply topically as needed. Apply to port with every chemotherapy.  30 g  1  . ondansetron (ZOFRAN) 8 MG tablet Take 1 tablet (8 mg total) by mouth every 8 (eight) hours as needed for nausea or vomiting.  20 tablet  1  . rOPINIRole (REQUIP) 1 MG tablet Take 1 mg by mouth at bedtime.      . sertraline (ZOLOFT) 100 MG tablet Take 100 mg by mouth daily.       No current facility-administered medications for this  visit.     Allergies: No Known Allergies  Past Medical History, Surgical history, Social history, and Family History were reviewed and updated.   Physical Exam: Blood pressure 135/80, pulse 68, temperature 98.3 F (36.8 C), temperature source Oral, resp. rate 18, height 5\' 2"  (1.575 m), weight 158 lb 3.2 oz (71.759 kg), SpO2 99.00%.  ECOG: 1  General appearance: Awake, alert woman appeared in no distress. Her mood is appropriate today. Head:  Normocephalic, without obvious abnormality Neck: no adenopathy Lymph nodes: Cervical, supraclavicular, and axillary nodes normal. Heart:regular rate and rhythm, S1, S2 normal, no murmur, click, rub or gallop Lung:chest clear, no wheezing, rales, normal symmetric air entry Abdomin: soft, distended and shifting dullness noted.  good bowel sounds without any shifting dullness. Nontender on palpation. EXT:no erythema, induration, or nodules. She had good pulses and capillary refill. Skin: Slight discoloration noted on her left foot unchanged from previous examination.  Lab Results: Lab Results  Component Value Date   WBC 3.2* 11/05/2013   HGB 11.3* 11/05/2013   HCT 36.7 11/05/2013   MCV 95.6 11/05/2013   PLT 57* 11/05/2013     Chemistry      Component Value Date/Time   NA 143 10/15/2013 0918   NA 136 07/03/2012 1359   K 5.0 10/15/2013 0918   K 4.1 07/03/2012 1359   CL 103 07/03/2012 1359   CO2 31* 10/15/2013 0918   CO2 30 07/03/2012 1359   BUN 17.9 10/15/2013 0918   BUN 9 07/03/2012 1359   CREATININE 0.8 10/15/2013 0918   CREATININE 0.7 07/03/2012 1359      Component Value Date/Time   CALCIUM 10.0 10/15/2013 0918   CALCIUM 9.6 07/03/2012 1359   ALKPHOS 56 10/15/2013 0918   ALKPHOS 80 07/03/2012 1359   AST 17 10/15/2013 0918   AST 42* 07/03/2012 1359   ALT 9 10/15/2013 0918   ALT 30 07/03/2012 1359   BILITOT 0.35 10/15/2013 0918   BILITOT 0.5 07/03/2012 1359       Results for [name] (MRN [id number]) as of 11/05/2013 10:01  Ref. Range 10/15/2013 09:18 10/15/2013 09:18  CA 125 Latest Range: <35 U/mL 58 (H) 41.8 (H)     Impression and Plan:  58 year old woman with the following issues:  1. Peritoneal carcinomatosis with omental involvement. The pathology confirmed the presence of adenocarcinoma of likely GYN etiology. Her CA 125 was elevated at 333.5 on 09/19/2012. She is status post systemic chemotherapy with an excellent response utilizing carboplatin and Taxotere. This therapy was  held in June of 2015 and currently on Avastin maintenance. Her tumor marker continue to respond with her CA 125 is down to 41. The plan is to continue with Avastin maintenance and add chemotherapy in the future upon relapse. I will repeat imaging studies in October 2015.  2. Neutropenia prophylaxis: This will not be needed with a Avastin.  3. IV access: She had a Port-A-Cath inserted without any complications.  4. Thrombocytopenia: Her platelets are continued to be relatively stable.  5. Epistaxis: Likely related to her thrombocytopenia which have resolved at this time.  6. Genetic counseling: She's had 2 malignancies and would benefit from genetic counseling and which I will make a referral today.  7. The followup: She will followup in 3 weeks for her next cycle of a Avastin.    [facility], MD 9/17/201510:29 AM

## 2013-11-06 LAB — CA 125(PREVIOUS METHOD): CA 125: 41.6 U/mL — ABNORMAL HIGH (ref 0.0–30.2)

## 2013-11-06 LAB — CA 125: CA 125: 54 U/mL — AB (ref <35)

## 2013-11-14 ENCOUNTER — Encounter: Payer: Self-pay | Admitting: Oncology

## 2013-11-26 ENCOUNTER — Ambulatory Visit (HOSPITAL_BASED_OUTPATIENT_CLINIC_OR_DEPARTMENT_OTHER): Payer: BC Managed Care – PPO | Admitting: Physician Assistant

## 2013-11-26 ENCOUNTER — Encounter: Payer: Self-pay | Admitting: Physician Assistant

## 2013-11-26 ENCOUNTER — Other Ambulatory Visit (HOSPITAL_BASED_OUTPATIENT_CLINIC_OR_DEPARTMENT_OTHER): Payer: BC Managed Care – PPO

## 2013-11-26 ENCOUNTER — Other Ambulatory Visit (HOSPITAL_BASED_OUTPATIENT_CLINIC_OR_DEPARTMENT_OTHER): Payer: BC Managed Care – PPO | Admitting: Oncology

## 2013-11-26 ENCOUNTER — Ambulatory Visit (HOSPITAL_BASED_OUTPATIENT_CLINIC_OR_DEPARTMENT_OTHER): Payer: BC Managed Care – PPO

## 2013-11-26 VITALS — BP 141/67 | HR 66 | Temp 97.7°F | Resp 18 | Ht 62.0 in | Wt 160.6 lb

## 2013-11-26 DIAGNOSIS — D696 Thrombocytopenia, unspecified: Secondary | ICD-10-CM

## 2013-11-26 DIAGNOSIS — C569 Malignant neoplasm of unspecified ovary: Secondary | ICD-10-CM

## 2013-11-26 DIAGNOSIS — C786 Secondary malignant neoplasm of retroperitoneum and peritoneum: Secondary | ICD-10-CM

## 2013-11-26 DIAGNOSIS — C800 Disseminated malignant neoplasm, unspecified: Secondary | ICD-10-CM

## 2013-11-26 DIAGNOSIS — C50919 Malignant neoplasm of unspecified site of unspecified female breast: Secondary | ICD-10-CM

## 2013-11-26 DIAGNOSIS — Z5112 Encounter for antineoplastic immunotherapy: Secondary | ICD-10-CM

## 2013-11-26 LAB — CBC WITH DIFFERENTIAL/PLATELET
BASO%: 0 % (ref 0.0–2.0)
Basophils Absolute: 0 10*3/uL (ref 0.0–0.1)
EOS ABS: 0.1 10*3/uL (ref 0.0–0.5)
EOS%: 2.8 % (ref 0.0–7.0)
HEMATOCRIT: 37.2 % (ref 34.8–46.6)
HGB: 11.4 g/dL — ABNORMAL LOW (ref 11.6–15.9)
LYMPH%: 39 % (ref 14.0–49.7)
MCH: 29.8 pg (ref 25.1–34.0)
MCHC: 30.6 g/dL — ABNORMAL LOW (ref 31.5–36.0)
MCV: 97.4 fL (ref 79.5–101.0)
MONO#: 0.4 10*3/uL (ref 0.1–0.9)
MONO%: 12.8 % (ref 0.0–14.0)
NEUT%: 45.4 % (ref 38.4–76.8)
NEUTROS ABS: 1.3 10*3/uL — AB (ref 1.5–6.5)
PLATELETS: 54 10*3/uL — AB (ref 145–400)
RBC: 3.82 10*6/uL (ref 3.70–5.45)
RDW: 15.3 % — ABNORMAL HIGH (ref 11.2–14.5)
WBC: 2.8 10*3/uL — AB (ref 3.9–10.3)
lymph#: 1.1 10*3/uL (ref 0.9–3.3)

## 2013-11-26 LAB — COMPREHENSIVE METABOLIC PANEL (CC13)
ALT: 12 U/L (ref 0–55)
AST: 16 U/L (ref 5–34)
Albumin: 3.1 g/dL — ABNORMAL LOW (ref 3.5–5.0)
Alkaline Phosphatase: 58 U/L (ref 40–150)
Anion Gap: 5 mEq/L (ref 3–11)
BUN: 12.1 mg/dL (ref 7.0–26.0)
CALCIUM: 9.5 mg/dL (ref 8.4–10.4)
CO2: 30 meq/L — AB (ref 22–29)
Chloride: 108 mEq/L (ref 98–109)
Creatinine: 0.7 mg/dL (ref 0.6–1.1)
Glucose: 98 mg/dl (ref 70–140)
Potassium: 4.5 mEq/L (ref 3.5–5.1)
SODIUM: 142 meq/L (ref 136–145)
TOTAL PROTEIN: 7.4 g/dL (ref 6.4–8.3)
Total Bilirubin: <0.2 mg/dL (ref 0.20–1.20)

## 2013-11-26 LAB — UA PROTEIN, DIPSTICK - CHCC: PROTEIN: NEGATIVE mg/dL

## 2013-11-26 MED ORDER — BEVACIZUMAB CHEMO INJECTION 400 MG/16ML
15.0000 mg/kg | Freq: Once | INTRAVENOUS | Status: AC
  Administered 2013-11-26: 1150 mg via INTRAVENOUS
  Filled 2013-11-26: qty 46

## 2013-11-26 MED ORDER — SODIUM CHLORIDE 0.9 % IJ SOLN
10.0000 mL | INTRAMUSCULAR | Status: DC | PRN
  Administered 2013-11-26: 10 mL
  Filled 2013-11-26: qty 10

## 2013-11-26 MED ORDER — SODIUM CHLORIDE 0.9 % IV SOLN
Freq: Once | INTRAVENOUS | Status: AC
  Administered 2013-11-26: 13:00:00 via INTRAVENOUS

## 2013-11-26 MED ORDER — HEPARIN SOD (PORK) LOCK FLUSH 100 UNIT/ML IV SOLN
500.0000 [IU] | Freq: Once | INTRAVENOUS | Status: AC | PRN
  Administered 2013-11-26: 500 [IU]
  Filled 2013-11-26: qty 5

## 2013-11-26 NOTE — Patient Instructions (Signed)
[facility] Discharge Instructions for Patients Receiving Chemotherapy  Today you received the following chemotherapy agents: Avastin. Remember your platelets are low and call us for any signs of bleeding. To help prevent nausea and vomiting after your treatment, we encourage you to take your nausea medication : Zofran 8mg  every 8 hours as needed.   If you develop nausea and vomiting that is not controlled by your nausea medication, call the clinic.   BELOW ARE SYMPTOMS THAT SHOULD BE REPORTED IMMEDIATELY:  *FEVER GREATER THAN 100.5 F  *CHILLS WITH OR WITHOUT FEVER  NAUSEA AND VOMITING THAT IS NOT CONTROLLED WITH YOUR NAUSEA MEDICATION  *UNUSUAL SHORTNESS OF BREATH  *UNUSUAL BRUISING OR BLEEDING  TENDERNESS IN MOUTH AND THROAT WITH OR WITHOUT PRESENCE OF ULCERS  *URINARY PROBLEMS  *BOWEL PROBLEMS  UNUSUAL RASH Items with * indicate a potential emergency and should be followed up as soon as possible.  Feel free to call the clinic you have any questions or concerns. The clinic phone number is [phone].

## 2013-11-26 NOTE — Progress Notes (Signed)
Per Awilda Metro, PA OK to treat and proceed with chemo today.

## 2013-11-26 NOTE — Progress Notes (Signed)
Hematology and Oncology Follow Up Visit  [name] [id number] 01-12-1956 58 y.o. 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 This was diagnosed in July of 2014.   Prior Therapy:  She is status post paracentesis performed on 09/17/2012 with the cytology confirmed the presence of adenocarcinoma and immunohistochemical stains suggest GYN etiology. She is also status post lumpectomy for breast cancer diagnosed 25 years ago followed by radiation and chemotherapy under the care of Dr. Sonny Dandy. She did not receive any hormonal therapy. Chemotherapy utilizing carboplatin and Taxotere started on 10/01/2012. Avastin was added with cycle 4. This was discontinued in June of 2015.  Current therapy:  She is currently on a Avastin alone after chemotherapy was discontinued in June of 2015.  Interim History: [name] presents today for a followup visit with her husband. Since her last visit, she she reports no new complaints. She reports having a rough week emotionally as they had to put down they're 57 year old of whom had Addison's disease. She's had some intermittent problems with her hemorrhoids occasionally having some bright red blood no significant bleeding. She's also had some increased nausea and vomiting but feels that her Zofran is adequate. She has not reported any complications related to a Avastin. She did not report any nausea or vomiting or exertional dyspnea but reveals that her exercise tolerance have declined. Her mood continues to improve compared to previous visits. She is not reporting any fevers or chills. Has not reported any shortness of breath or cough. She denied any pain with swallowing.  She denied any specific pain. She denied nausea  fever and chills.  She has reported also grade 1 neuropathy. Her  appetite has improved and her weight is relatively stable.  She has not any shortness of breath or chest pain. She denied any GYN bleeding. Her epistaxis has resolved. She has not reported any recent complications or illnesses. She has not reported any increase in her abdominal pain or early satiety. She also reports no hematochezia or melena. She has not reported any syncope alteration of mental status. Had not report any abdominal distention. Remainder of the review of systems is unremarkable.  Medications: I have reviewed the patient's current medications.  Current Outpatient Prescriptions  Medication Sig Dispense Refill  . ALPRAZolam (XANAX) 1 MG tablet Take 1 mg by mouth at bedtime as needed for anxiety.      . divalproex (DEPAKOTE ER) 500 MG 24 hr tablet Take 1,500 mg by mouth at bedtime.       . gabapentin (NEURONTIN) 100 MG capsule TAKE ONE CAPSULE BY MOUTH 3 TIMES A DAY  90 capsule  1  . lamoTRIgine (LAMICTAL) 150 MG tablet Take 175 mg by mouth daily.       Marland Kitchen levothyroxine (SYNTHROID, LEVOTHROID) 100 MCG tablet Take 100 mcg by mouth daily before breakfast.       . lidocaine-prilocaine (EMLA) cream Apply topically as needed. Apply to port with every chemotherapy.  30 g  1  . ondansetron (ZOFRAN) 8 MG tablet Take 1 tablet (8 mg total) by mouth every 8 (eight) hours as needed for nausea or vomiting.  20 tablet  1  . rOPINIRole (REQUIP) 1 MG tablet Take 1 mg by mouth at bedtime.      . sertraline (ZOLOFT) 100 MG tablet Take 100 mg by mouth daily.  No current facility-administered medications for this visit.   Facility-Administered Medications Ordered in Other Visits  Medication Dose Route Frequency Provider Last Rate Last Dose  . sodium chloride 0.9 % injection 10 mL  10 mL Intracatheter PRN Wyatt Portela, MD   10 mL at 11/26/13 1608     Allergies: No Known Allergies  Past Medical History, Surgical history, Social history, and Family History were reviewed and  updated.   Physical Exam: Blood pressure 141/67, pulse 66, temperature 97.7 F (36.5 C), temperature source Oral, resp. rate 18, height 5\' 2"  (1.575 m), weight 160 lb 9.6 oz (72.848 kg).  ECOG: 1  General appearance: Awake, alert woman appeared in no distress. Her mood is appropriate today. Head: Normocephalic, without obvious abnormality Neck: no adenopathy Lymph nodes: Cervical, supraclavicular, and axillary nodes normal. Heart:regular rate and rhythm, S1, S2 normal, no murmur, click, rub or gallop Lung:chest clear, no wheezing, rales, normal symmetric air entry Abdomin: soft, distended and shifting dullness noted.  good bowel sounds without any shifting dullness. Nontender on palpation. EXT:no erythema, induration, or nodules. She had good pulses and capillary refill. Skin: Slight discoloration noted on her left foot unchanged from previous examination.  Lab Results: Lab Results  Component Value Date   WBC 2.8* 11/26/2013   HGB 11.4* 11/26/2013   HCT 37.2 11/26/2013   MCV 97.4 11/26/2013   PLT 54* 11/26/2013     Chemistry      Component Value Date/Time   NA 142 11/26/2013 1138   NA 136 07/03/2012 1359   K 4.5 11/26/2013 1138   K 4.1 07/03/2012 1359   CL 103 07/03/2012 1359   CO2 30* 11/26/2013 1138   CO2 30 07/03/2012 1359   BUN 12.1 11/26/2013 1138   BUN 9 07/03/2012 1359   CREATININE 0.7 11/26/2013 1138   CREATININE 0.7 07/03/2012 1359      Component Value Date/Time   CALCIUM 9.5 11/26/2013 1138   CALCIUM 9.6 07/03/2012 1359   ALKPHOS 58 11/26/2013 1138   ALKPHOS 80 07/03/2012 1359   AST 16 11/26/2013 1138   AST 42* 07/03/2012 1359   ALT 12 11/26/2013 1138   ALT 30 07/03/2012 1359   BILITOT <0.20 11/26/2013 1138   BILITOT 0.5 07/03/2012 1359       Results for [name], [name] (MRN [id number]) as of 11/05/2013 10:01  Ref. Range 10/15/2013 09:18 10/15/2013 09:18  CA 125 Latest Range: <35 U/mL 58 (H) 41.8 (H)     Impression and Plan:  58 year old woman with the following  issues:  1. Peritoneal carcinomatosis with omental involvement. The pathology confirmed the presence of adenocarcinoma of likely GYN etiology. Her CA 125 was elevated at 333.5 on 09/19/2012. She is status post systemic chemotherapy with an excellent response utilizing carboplatin and Taxotere. This therapy was held in June of 2015 and currently on Avastin maintenance. Her tumor marker continue to respond with her CA 125 is down to 41. The plan is to continue with Avastin maintenance and add chemotherapy in the future upon relapse.Repeat imaging studies in October 2015.  2. Neutropenia prophylaxis: This will not be needed with a Avastin.  3. IV access: She had a Port-A-Cath inserted without any complications.  4. Thrombocytopenia: Her platelets are continued to be relatively stable.  5. Epistaxis: Likely related to her thrombocytopenia which have resolved at this time.  6. Genetic counseling: She's had 2 malignancies and would benefit from genetic counseling. Referral has been made.   7. The followup: She will followup in  3 weeks for her next cycle of a Avastin.    Awilda Metro E, PA-C  10/8/20154:43 PM

## 2013-11-27 ENCOUNTER — Encounter: Payer: Self-pay | Admitting: Oncology

## 2013-11-27 LAB — CA 125: CA 125: 54 U/mL — ABNORMAL HIGH (ref <35)

## 2013-11-29 NOTE — Patient Instructions (Signed)
Follow-up in 3 weeks

## 2013-12-15 ENCOUNTER — Ambulatory Visit (HOSPITAL_COMMUNITY)
Admission: RE | Admit: 2013-12-15 | Discharge: 2013-12-15 | Disposition: A | Discharge status: Home / Self Care | Payer: BC Managed Care – PPO | Source: Ambulatory Visit | Attending: Oncology | Admitting: Oncology

## 2013-12-15 ENCOUNTER — Other Ambulatory Visit (HOSPITAL_BASED_OUTPATIENT_CLINIC_OR_DEPARTMENT_OTHER): Payer: BC Managed Care – PPO

## 2013-12-15 ENCOUNTER — Encounter (HOSPITAL_COMMUNITY): Payer: Self-pay

## 2013-12-15 DIAGNOSIS — C569 Malignant neoplasm of unspecified ovary: Secondary | ICD-10-CM | POA: Diagnosis present

## 2013-12-15 DIAGNOSIS — Z9011 Acquired absence of right breast and nipple: Secondary | ICD-10-CM | POA: Diagnosis not present

## 2013-12-15 DIAGNOSIS — Z853 Personal history of malignant neoplasm of breast: Secondary | ICD-10-CM | POA: Insufficient documentation

## 2013-12-15 DIAGNOSIS — C50919 Malignant neoplasm of unspecified site of unspecified female breast: Secondary | ICD-10-CM

## 2013-12-15 DIAGNOSIS — M5032 Other cervical disc degeneration, mid-cervical region: Secondary | ICD-10-CM | POA: Diagnosis present

## 2013-12-15 DIAGNOSIS — C800 Disseminated malignant neoplasm, unspecified: Secondary | ICD-10-CM

## 2013-12-15 DIAGNOSIS — M4322 Fusion of spine, cervical region: Secondary | ICD-10-CM | POA: Diagnosis not present

## 2013-12-15 DIAGNOSIS — M1990 Unspecified osteoarthritis, unspecified site: Secondary | ICD-10-CM | POA: Diagnosis not present

## 2013-12-15 LAB — CBC WITH DIFFERENTIAL/PLATELET
BASO%: 0.3 % (ref 0.0–2.0)
Basophils Absolute: 0 10e3/uL (ref 0.0–0.1)
EOS%: 3.9 % (ref 0.0–7.0)
Eosinophils Absolute: 0.1 10e3/uL (ref 0.0–0.5)
HCT: 39 % (ref 34.8–46.6)
HGB: 11.9 g/dL (ref 11.6–15.9)
LYMPH%: 41 % (ref 14.0–49.7)
MCH: 29.7 pg (ref 25.1–34.0)
MCHC: 30.5 g/dL — ABNORMAL LOW (ref 31.5–36.0)
MCV: 97.3 fL (ref 79.5–101.0)
MONO#: 0.4 10e3/uL (ref 0.1–0.9)
MONO%: 12.6 % (ref 0.0–14.0)
NEUT#: 1.3 10e3/uL — ABNORMAL LOW (ref 1.5–6.5)
NEUT%: 42.2 % (ref 38.4–76.8)
Platelets: 63 10e3/uL — ABNORMAL LOW (ref 145–400)
RBC: 4.01 10e6/uL (ref 3.70–5.45)
RDW: 15.2 % — ABNORMAL HIGH (ref 11.2–14.5)
WBC: 3.1 10e3/uL — ABNORMAL LOW (ref 3.9–10.3)
lymph#: 1.3 10e3/uL (ref 0.9–3.3)

## 2013-12-15 LAB — COMPREHENSIVE METABOLIC PANEL (CC13)
ALT: 10 U/L (ref 0–55)
AST: 15 U/L (ref 5–34)
Albumin: 3.4 g/dL — ABNORMAL LOW (ref 3.5–5.0)
Alkaline Phosphatase: 59 U/L (ref 40–150)
Anion Gap: 7 mEq/L (ref 3–11)
BUN: 9.7 mg/dL (ref 7.0–26.0)
CHLORIDE: 105 meq/L (ref 98–109)
CO2: 29 meq/L (ref 22–29)
CREATININE: 0.8 mg/dL (ref 0.6–1.1)
Calcium: 9.9 mg/dL (ref 8.4–10.4)
Glucose: 82 mg/dl (ref 70–140)
Potassium: 5.3 mEq/L — ABNORMAL HIGH (ref 3.5–5.1)
Sodium: 141 mEq/L (ref 136–145)
Total Bilirubin: 0.31 mg/dL (ref 0.20–1.20)
Total Protein: 7.9 g/dL (ref 6.4–8.3)

## 2013-12-15 MED ORDER — IOHEXOL 300 MG/ML  SOLN
100.0000 mL | Freq: Once | INTRAMUSCULAR | Status: AC | PRN
  Administered 2013-12-15: 100 mL via INTRAVENOUS

## 2013-12-16 LAB — CA 125: CA 125: 62 U/mL — AB (ref <35)

## 2013-12-17 ENCOUNTER — Telehealth: Payer: Self-pay | Admitting: *Deleted

## 2013-12-17 ENCOUNTER — Telehealth: Payer: Self-pay | Admitting: Oncology

## 2013-12-17 ENCOUNTER — Ambulatory Visit (HOSPITAL_BASED_OUTPATIENT_CLINIC_OR_DEPARTMENT_OTHER): Payer: BC Managed Care – PPO | Admitting: Oncology

## 2013-12-17 ENCOUNTER — Ambulatory Visit (HOSPITAL_BASED_OUTPATIENT_CLINIC_OR_DEPARTMENT_OTHER): Payer: BC Managed Care – PPO

## 2013-12-17 VITALS — BP 122/73 | HR 72 | Temp 98.1°F | Resp 18 | Ht 62.0 in | Wt 160.2 lb

## 2013-12-17 VITALS — BP 120/85 | HR 79

## 2013-12-17 DIAGNOSIS — C801 Malignant (primary) neoplasm, unspecified: Secondary | ICD-10-CM

## 2013-12-17 DIAGNOSIS — R180 Malignant ascites: Secondary | ICD-10-CM

## 2013-12-17 DIAGNOSIS — C569 Malignant neoplasm of unspecified ovary: Secondary | ICD-10-CM

## 2013-12-17 DIAGNOSIS — C786 Secondary malignant neoplasm of retroperitoneum and peritoneum: Secondary | ICD-10-CM

## 2013-12-17 DIAGNOSIS — Z5112 Encounter for antineoplastic immunotherapy: Secondary | ICD-10-CM

## 2013-12-17 DIAGNOSIS — D696 Thrombocytopenia, unspecified: Secondary | ICD-10-CM

## 2013-12-17 MED ORDER — SODIUM CHLORIDE 0.9 % IV SOLN
Freq: Once | INTRAVENOUS | Status: AC
  Administered 2013-12-17: 11:00:00 via INTRAVENOUS

## 2013-12-17 MED ORDER — SODIUM CHLORIDE 0.9 % IJ SOLN
10.0000 mL | INTRAMUSCULAR | Status: DC | PRN
  Administered 2013-12-17: 10 mL
  Filled 2013-12-17: qty 10

## 2013-12-17 MED ORDER — SODIUM CHLORIDE 0.9 % IV SOLN
15.0000 mg/kg | Freq: Once | INTRAVENOUS | Status: AC
  Administered 2013-12-17: 1150 mg via INTRAVENOUS
  Filled 2013-12-17: qty 46

## 2013-12-17 MED ORDER — HEPARIN SOD (PORK) LOCK FLUSH 100 UNIT/ML IV SOLN
500.0000 [IU] | Freq: Once | INTRAVENOUS | Status: AC | PRN
  Administered 2013-12-17: 500 [IU]
  Filled 2013-12-17: qty 5

## 2013-12-17 NOTE — Telephone Encounter (Signed)
Per staff message and POF I have scheduled appts. Advised scheduler of appts. JMW  

## 2013-12-17 NOTE — Progress Notes (Signed)
Hematology and Oncology Follow Up Visit  [name] [id number] 30-Mar-1955 58 y.o. 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 This was diagnosed in July of 2014.   Prior Therapy:  She is status post paracentesis performed on 09/17/2012 with the cytology confirmed the presence of adenocarcinoma and immunohistochemical stains suggest GYN etiology. She is also status post lumpectomy for breast cancer diagnosed 25 years ago followed by radiation and chemotherapy under the care of Dr. Sonny Dandy. She did not receive any hormonal therapy. Chemotherapy utilizing carboplatin and Taxotere started on 10/01/2012. Avastin was added with cycle 4. This was discontinued in June of 2015.  Current therapy:  She is currently on a Avastin alone after chemotherapy was discontinued in June of 2015.  Interim History: [name] presents today for a followup visit with her husband. Since her last visit, she she reports no new complaints. She continues to have some neck pain which has not changed. She continues to have mild fatigue but have not really changed dramatically. She has not reported any complications related to a Avastin. She did not report any nausea or vomiting or exertional dyspnea but reveals that her exercise tolerance have declined. Her mood continues to improve compared to previous visits. She is not reporting any fevers or chills. Has not reported any shortness of breath or cough. She denied any pain with swallowing.She denied nausea  fever and chills.  She has reported also grade 1 neuropathy. Her appetite has improved and her weight is relatively stable.  She has not any shortness of breath or chest pain. She denied any GYN bleeding. She has not reported any recent complications or illnesses. She has not reported any  increase in her abdominal pain or early satiety. She also reports no hematochezia or melena. She has not reported any syncope alteration of mental status. Had not report any abdominal distention. Rest of the review of systems is unremarkable.  Medications: I have reviewed the patient's current medications.  Current Outpatient Prescriptions  Medication Sig Dispense Refill  . ALPRAZolam (XANAX) 1 MG tablet Take 1 mg by mouth at bedtime as needed for anxiety.      . divalproex (DEPAKOTE ER) 500 MG 24 hr tablet Take 1,500 mg by mouth at bedtime.       . gabapentin (NEURONTIN) 100 MG capsule TAKE ONE CAPSULE BY MOUTH 3 TIMES A DAY  90 capsule  1  . lamoTRIgine (LAMICTAL) 150 MG tablet Take 175 mg by mouth daily.       Marland Kitchen levothyroxine (SYNTHROID, LEVOTHROID) 100 MCG tablet Take 100 mcg by mouth daily before breakfast.       . lidocaine-prilocaine (EMLA) cream Apply topically as needed. Apply to port with every chemotherapy.  30 g  1  . OLANZapine-FLUoxetine (SYMBYAX) 6-25 MG per capsule       . ondansetron (ZOFRAN) 8 MG tablet Take 1 tablet (8 mg total) by mouth every 8 (eight) hours as needed for nausea or vomiting.  20 tablet  1  . rOPINIRole (REQUIP) 1 MG tablet Take 1 mg by mouth at bedtime.       No current facility-administered medications for this visit.     Allergies: No Known Allergies  Past Medical History, Surgical history, Social history, and Family History were reviewed and updated.   Physical Exam: Blood pressure 122/73, pulse 72, temperature 98.1 F (36.7  C), temperature source Oral, resp. rate 18, height 5\' 2"  (1.575 m), weight 160 lb 3.2 oz (72.666 kg).  ECOG: 1  General appearance: Her mood is appropriate today. Head: Normocephalic, without obvious abnormality Neck: no adenopathy Lymph nodes: Cervical, supraclavicular, and axillary nodes normal. Heart:regular rate and rhythm, S1, S2 normal, no murmur, click, rub or gallop Lung:chest clear, no wheezing, rales, normal  symmetric air entry Abdomin: soft, distended and shifting dullness noted.  good bowel sounds without any shifting dullness. Nontender on palpation. EXT:no erythema, induration, or nodules. She had good pulses and capillary refill. Skin: No rashes or lesions.  Lab Results: Lab Results  Component Value Date   WBC 3.1* 12/15/2013   HGB 11.9 12/15/2013   HCT 39.0 12/15/2013   MCV 97.3 12/15/2013   PLT 63* 12/15/2013     Chemistry      Component Value Date/Time   NA 141 12/15/2013 1008   NA 136 07/03/2012 1359   K 5.3 Repeated and Verified* 12/15/2013 1008   K 4.1 07/03/2012 1359   CL 103 07/03/2012 1359   CO2 29 12/15/2013 1008   CO2 30 07/03/2012 1359   BUN 9.7 12/15/2013 1008   BUN 9 07/03/2012 1359   CREATININE 0.8 12/15/2013 1008   CREATININE 0.7 07/03/2012 1359      Component Value Date/Time   CALCIUM 9.9 12/15/2013 1008   CALCIUM 9.6 07/03/2012 1359   ALKPHOS 59 12/15/2013 1008   ALKPHOS 80 07/03/2012 1359   AST 15 12/15/2013 1008   AST 42* 07/03/2012 1359   ALT 10 12/15/2013 1008   ALT 30 07/03/2012 1359   BILITOT 0.31 12/15/2013 1008   BILITOT 0.5 07/03/2012 1359      Results for [name], [name] (MRN [id number]) as of 12/17/2013 09:28  Ref. Range 11/05/2013 09:43 11/26/2013 11:37 12/15/2013 10:08  CA 125 Latest Range: <35 U/mL 41.6 (H) 54 (H) 62 (H)    EXAM:  CT CHEST, ABDOMEN, AND PELVIS WITH CONTRAST  TECHNIQUE:  Multidetector CT imaging of the chest, abdomen and pelvis was  performed following the standard protocol during bolus  administration of intravenous contrast.  CONTRAST: 190mL OMNIPAQUE IOHEXOL 300 MG/ML SOLN  COMPARISON: PET-CT dated 09/22/2013.  FINDINGS:  CT CHEST FINDINGS  Lungs are clear. No suspicious pulmonary nodules. No pleural  effusion or pneumothorax.  Heart is normal in size. No pericardial effusion.  Right chest port terminating at the cavoatrial junction.  No suspicious mediastinal, hilar, or axillary lymphadenopathy.  Status post right  mastectomy with right axillary lymph node  dissection.  Degenerative changes of the thoracic spine.  CT ABDOMEN AND PELVIS FINDINGS  Hepatobiliary: Mildly nodular contour of the left hepatic lobe,  nonspecific. No suspicious/enhancing hepatic lesions.  Gallbladder is unremarkable. No intrahepatic or extrahepatic ductal  dilatation.  Pancreas: Within normal limits.  Spleen: At the upper limits of normal for size, measuring 13.3 cm.  Adrenals/Urinary Tract: Adrenal glands are unremarkable.  6 mm cyst in the anterior interpolar right kidney (series 7/ image  18). Left kidney is within normal limits. No hydronephrosis.  Bladder is mildly thick-walled anteriorly although underdistended  (series 2/image 106).  Stomach/Bowel: Stomach is unremarkable.  No evidence of bowel obstruction.  Mild to moderate colonic stool burden.  Vascular/Lymphatic: No evidence of abdominal aortic aneurysm.  No suspicious abdominopelvic lymphadenopathy.  Reproductive: Uterus and bilateral ovaries are grossly unremarkable.  Other: Trace pelvic mesenteric fluid/stranding (series 2/image 103).  Peritoneal/omental disease, grossly unchanged, including:  --2.3 x 9.8 cm implant beneath the left  mid abdominal (series  2/image 35), previously 2.4 x 8.4 cm  --7.6 x 2.3 cm implant along the right lateral abdominal wall  (series 2/image 35), previously 7.2 x 2.6 cm  --4.8 x 3.4 cm implant along the inferior right liver (series 2/  image 69), previously 5.4 x 4.0 cm  Musculoskeletal: Mild degenerative changes at L5-S1.  IMPRESSION:  Peritoneal/omental disease, grossly unchanged. Trace pelvic  fluid/stranding.  No evidence of metastatic disease in the chest.  Status post right mastectomy with right axillary lymph node  dissection.     Impression and Plan:  58 year old woman with the following issues:  1. Peritoneal carcinomatosis with omental involvement. The pathology confirmed the presence of adenocarcinoma of  likely GYN etiology. Her CA 125 was elevated at 333.5 on 09/19/2012. She is status post systemic chemotherapy with an excellent response utilizing carboplatin and Taxotere. This therapy was held in June of 2015 and currently on Avastin maintenance.  CT scan on 12/15/2013 was discussed today. She continued to have relatively stable disease. The plan is to continue with a Avastin maintenance to disease progression.  2. Neutropenia prophylaxis: This will not be needed with a Avastin.  3. IV access: She had a Port-A-Cath inserted without any complications.  4. Thrombocytopenia: Her platelets are continued to be improving.  5. Epistaxis: Likely related to her thrombocytopenia which have resolved at this time.  6. Genetic counseling: Referral has been made in the past.  7. The followup: She will followup in 3 weeks for her next cycle of a Avastin.    YWVPXT,GGYIR, MD 10/29/20159:53 AM

## 2013-12-17 NOTE — Patient Instructions (Signed)
[facility] Discharge Instructions for Patients Receiving Chemotherapy  Today you received the following chemotherapy agents avastin  To help prevent nausea and vomiting after your treatment, we encourage you to take your nausea medication as directed   If you develop nausea and vomiting that is not controlled by your nausea medication, call the clinic.   BELOW ARE SYMPTOMS THAT SHOULD BE REPORTED IMMEDIATELY:  *FEVER GREATER THAN 100.5 F  *CHILLS WITH OR WITHOUT FEVER  NAUSEA AND VOMITING THAT IS NOT CONTROLLED WITH YOUR NAUSEA MEDICATION  *UNUSUAL SHORTNESS OF BREATH  *UNUSUAL BRUISING OR BLEEDING  TENDERNESS IN MOUTH AND THROAT WITH OR WITHOUT PRESENCE OF ULCERS  *URINARY PROBLEMS  *BOWEL PROBLEMS  UNUSUAL RASH Items with * indicate a potential emergency and should be followed up as soon as possible.  Feel free to call the clinic you have any questions or concerns. The clinic phone number is [phone].  

## 2013-12-17 NOTE — Telephone Encounter (Signed)
Gave avs & cal for Nov/Dec. Sent Mess to sch tx

## 2014-01-07 ENCOUNTER — Ambulatory Visit (HOSPITAL_BASED_OUTPATIENT_CLINIC_OR_DEPARTMENT_OTHER): Payer: BC Managed Care – PPO

## 2014-01-07 ENCOUNTER — Other Ambulatory Visit: Payer: Self-pay | Admitting: Oncology

## 2014-01-07 ENCOUNTER — Ambulatory Visit (HOSPITAL_BASED_OUTPATIENT_CLINIC_OR_DEPARTMENT_OTHER): Payer: BC Managed Care – PPO | Admitting: Physician Assistant

## 2014-01-07 ENCOUNTER — Other Ambulatory Visit (HOSPITAL_BASED_OUTPATIENT_CLINIC_OR_DEPARTMENT_OTHER): Payer: BC Managed Care – PPO

## 2014-01-07 VITALS — BP 134/88 | HR 76 | Temp 98.5°F | Resp 19 | Ht 62.0 in | Wt 157.6 lb

## 2014-01-07 DIAGNOSIS — C569 Malignant neoplasm of unspecified ovary: Secondary | ICD-10-CM

## 2014-01-07 DIAGNOSIS — Z5112 Encounter for antineoplastic immunotherapy: Secondary | ICD-10-CM

## 2014-01-07 LAB — CBC WITH DIFFERENTIAL/PLATELET
BASO%: 0 % (ref 0.0–2.0)
Basophils Absolute: 0 10*3/uL (ref 0.0–0.1)
EOS%: 2.3 % (ref 0.0–7.0)
Eosinophils Absolute: 0.1 10*3/uL (ref 0.0–0.5)
HEMATOCRIT: 38.8 % (ref 34.8–46.6)
HGB: 12 g/dL (ref 11.6–15.9)
LYMPH#: 1.3 10*3/uL (ref 0.9–3.3)
LYMPH%: 38.3 % (ref 14.0–49.7)
MCH: 29.9 pg (ref 25.1–34.0)
MCHC: 30.9 g/dL — AB (ref 31.5–36.0)
MCV: 96.5 fL (ref 79.5–101.0)
MONO#: 0.3 10*3/uL (ref 0.1–0.9)
MONO%: 9.9 % (ref 0.0–14.0)
NEUT#: 1.7 10*3/uL (ref 1.5–6.5)
NEUT%: 49.5 % (ref 38.4–76.8)
Platelets: 67 10*3/uL — ABNORMAL LOW (ref 145–400)
RBC: 4.02 10*6/uL (ref 3.70–5.45)
RDW: 14.6 % — ABNORMAL HIGH (ref 11.2–14.5)
WBC: 3.5 10*3/uL — ABNORMAL LOW (ref 3.9–10.3)

## 2014-01-07 LAB — COMPREHENSIVE METABOLIC PANEL (CC13)
ALT: 10 U/L (ref 0–55)
AST: 18 U/L (ref 5–34)
Albumin: 3.4 g/dL — ABNORMAL LOW (ref 3.5–5.0)
Alkaline Phosphatase: 63 U/L (ref 40–150)
Anion Gap: 8 mEq/L (ref 3–11)
BUN: 16.3 mg/dL (ref 7.0–26.0)
CALCIUM: 9.4 mg/dL (ref 8.4–10.4)
CHLORIDE: 106 meq/L (ref 98–109)
CO2: 26 mEq/L (ref 22–29)
Creatinine: 0.7 mg/dL (ref 0.6–1.1)
Glucose: 85 mg/dl (ref 70–140)
Potassium: 4.6 mEq/L (ref 3.5–5.1)
SODIUM: 140 meq/L (ref 136–145)
Total Bilirubin: 0.33 mg/dL (ref 0.20–1.20)
Total Protein: 7.5 g/dL (ref 6.4–8.3)

## 2014-01-07 MED ORDER — SODIUM CHLORIDE 0.9 % IV SOLN
15.0000 mg/kg | Freq: Once | INTRAVENOUS | Status: AC
  Administered 2014-01-07: 1150 mg via INTRAVENOUS
  Filled 2014-01-07: qty 46

## 2014-01-07 MED ORDER — HEPARIN SOD (PORK) LOCK FLUSH 100 UNIT/ML IV SOLN
500.0000 [IU] | Freq: Once | INTRAVENOUS | Status: AC | PRN
  Administered 2014-01-07: 500 [IU]
  Filled 2014-01-07: qty 5

## 2014-01-07 MED ORDER — SODIUM CHLORIDE 0.9 % IV SOLN
Freq: Once | INTRAVENOUS | Status: AC
  Administered 2014-01-07: 13:00:00 via INTRAVENOUS

## 2014-01-07 MED ORDER — SODIUM CHLORIDE 0.9 % IJ SOLN
10.0000 mL | INTRAMUSCULAR | Status: DC | PRN
  Administered 2014-01-07: 10 mL
  Filled 2014-01-07: qty 10

## 2014-01-07 NOTE — Progress Notes (Signed)
Per Awilda Metro, PA-C OK to treat with Avastin despite PLT count.

## 2014-01-08 LAB — CA 125: CA 125: 53 U/mL — ABNORMAL HIGH (ref <35)

## 2014-01-10 NOTE — Progress Notes (Signed)
Hematology and Oncology Follow Up Visit  [name] [id number] 01-20-1956 58 y.o. 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 This was diagnosed in July of 2014.   Prior Therapy:  She is status post paracentesis performed on 09/17/2012 with the cytology confirmed the presence of adenocarcinoma and immunohistochemical stains suggest GYN etiology. She is also status post lumpectomy for breast cancer diagnosed 25 years ago followed by radiation and chemotherapy under the care of Dr. Sonny Padilla. She did not receive any hormonal therapy. Chemotherapy utilizing carboplatin and Taxotere started on 10/01/2012. Avastin was added with cycle 4. This was discontinued in June of 2015.  Current therapy:  She is currently on a Avastin alone after chemotherapy was discontinued in June of 2015.  Interim History: [name] presents today for a followup visit with her husband. Since her last visit, she she reports no new complaints. She continues to have a cough but there is no change from her baseline. She denied any fever chills. She's had some nausea this morning is well managed by her current antiemetics. She reports that she received her flu shot 01/04/2014 at her primary care physician's office.  She continues to have mild fatigue but have not really changed dramatically. She has not reported any complications related to a Avastin. She's had no bleeding or bruising. She did not report any exertional dyspnea but reveals that her exercise tolerance have declined. Her mood continues to improve compared to previous visits. She is not reporting any fevers or chills. Has not reported any shortness of breath or cough. She denied any pain with swallowing.She denied nausea  fever and chills.  She has reported also grade 1 neuropathy. Her  appetite has improved and her weight is relatively stable.  She has not any shortness of breath or chest pain. She denied any GYN bleeding. She has not reported any recent complications or illnesses. She has not reported any increase in her abdominal pain or early satiety. She also reports no hematochezia or melena. She has not reported any syncope alteration of mental status. Had not report any abdominal distention. Remainder of the review of systems is unremarkable.  Medications: I have reviewed the patient's current medications.  Current Outpatient Prescriptions  Medication Sig Dispense Refill  . ALPRAZolam (XANAX) 1 MG tablet Take 1 mg by mouth at bedtime as needed for anxiety.    . divalproex (DEPAKOTE ER) 500 MG 24 hr tablet Take 1,500 mg by mouth at bedtime.     . gabapentin (NEURONTIN) 100 MG capsule TAKE ONE CAPSULE BY MOUTH 3 TIMES A DAY 90 capsule 1  . lamoTRIgine (LAMICTAL) 150 MG tablet Take 175 mg by mouth daily.     Marland Kitchen levothyroxine (SYNTHROID, LEVOTHROID) 100 MCG tablet Take 100 mcg by mouth daily before breakfast.     . lidocaine-prilocaine (EMLA) cream Apply topically as needed. Apply to port with every chemotherapy. 30 g 1  . OLANZapine-FLUoxetine (SYMBYAX) 6-25 MG per capsule     . ondansetron (ZOFRAN) 8 MG tablet Take 1 tablet (8 mg total) by mouth every 8 (eight) hours as needed for nausea or vomiting. 20 tablet 1  . rOPINIRole (REQUIP) 1 MG tablet Take 1 mg by mouth at bedtime.     No current facility-administered medications for this visit.     Allergies: No Known Allergies  Past Medical History, Surgical history, Social  history, and Family History were reviewed and updated.   Physical Exam: Blood pressure 134/88, pulse 76, temperature 98.5 F (36.9 C), temperature source Oral, resp. rate 19, height 5\' 2"  (1.575 m), weight 157 lb 9.6 oz (71.487 kg), SpO2 98 %.  ECOG: 1  General appearance: Her mood is appropriate today. Head: Normocephalic, without obvious  abnormality Neck: no adenopathy Lymph nodes: Cervical, supraclavicular, and axillary nodes normal. Heart:regular rate and rhythm, S1, S2 normal, no murmur, click, rub or gallop Lung:chest clear, no wheezing, rales, normal symmetric air entry Abdomin: soft, distended and shifting dullness noted.  good bowel sounds without any shifting dullness. Nontender on palpation. EXT:no erythema, induration, or nodules. She had good pulses and capillary refill. Skin: No rashes or lesions.  Lab Results: Lab Results  Component Value Date   WBC 3.5* 01/07/2014   HGB 12.0 01/07/2014   HCT 38.8 01/07/2014   MCV 96.5 01/07/2014   PLT 67* 01/07/2014     Chemistry      Component Value Date/Time   NA 140 01/07/2014 1118   NA 136 07/03/2012 1359   K 4.6 01/07/2014 1118   K 4.1 07/03/2012 1359   CL 103 07/03/2012 1359   CO2 26 01/07/2014 1118   CO2 30 07/03/2012 1359   BUN 16.3 01/07/2014 1118   BUN 9 07/03/2012 1359   CREATININE 0.7 01/07/2014 1118   CREATININE 0.7 07/03/2012 1359      Component Value Date/Time   CALCIUM 9.4 01/07/2014 1118   CALCIUM 9.6 07/03/2012 1359   ALKPHOS 63 01/07/2014 1118   ALKPHOS 80 07/03/2012 1359   AST 18 01/07/2014 1118   AST 42* 07/03/2012 1359   ALT 10 01/07/2014 1118   ALT 30 07/03/2012 1359   BILITOT 0.33 01/07/2014 1118   BILITOT 0.5 07/03/2012 1359      Results for Julie, Padilla (MRN [id number]) as of 12/17/2013 09:28  Ref. Range 11/05/2013 09:43 11/26/2013 11:37 12/15/2013 10:08  CA 125 Latest Range: <35 U/mL 41.6 (H) 54 (H) 62 (H)    EXAM:  CT CHEST, ABDOMEN, AND PELVIS WITH CONTRAST  TECHNIQUE:  Multidetector CT imaging of the chest, abdomen and pelvis was  performed following the standard protocol during bolus  administration of intravenous contrast.  CONTRAST: 121mL OMNIPAQUE IOHEXOL 300 MG/ML SOLN  COMPARISON: PET-CT dated 09/22/2013.  FINDINGS:  CT CHEST FINDINGS  Lungs are clear. No suspicious pulmonary nodules. No pleural   effusion or pneumothorax.  Heart is normal in size. No pericardial effusion.  Right chest port terminating at the cavoatrial junction.  No suspicious mediastinal, hilar, or axillary lymphadenopathy.  Status post right mastectomy with right axillary lymph node  dissection.  Degenerative changes of the thoracic spine.  CT ABDOMEN AND PELVIS FINDINGS  Hepatobiliary: Mildly nodular contour of the left hepatic lobe,  nonspecific. No suspicious/enhancing hepatic lesions.  Gallbladder is unremarkable. No intrahepatic or extrahepatic ductal  dilatation.  Pancreas: Within normal limits.  Spleen: At the upper limits of normal for size, measuring 13.3 cm.  Adrenals/Urinary Tract: Adrenal glands are unremarkable.  6 mm cyst in the anterior interpolar right kidney (series 7/ image  18). Left kidney is within normal limits. No hydronephrosis.  Bladder is mildly thick-walled anteriorly although underdistended  (series 2/image 106).  Stomach/Bowel: Stomach is unremarkable.  No evidence of bowel obstruction.  Mild to moderate colonic stool burden.  Vascular/Lymphatic: No evidence of abdominal aortic aneurysm.  No suspicious abdominopelvic lymphadenopathy.  Reproductive: Uterus and bilateral ovaries are grossly unremarkable.  Other: Trace  pelvic mesenteric fluid/stranding (series 2/image 103).  Peritoneal/omental disease, grossly unchanged, including:  --2.3 x 9.8 cm implant beneath the left mid abdominal (series  2/image 35), previously 2.4 x 8.4 cm  --7.6 x 2.3 cm implant along the right lateral abdominal wall  (series 2/image 35), previously 7.2 x 2.6 cm  --4.8 x 3.4 cm implant along the inferior right liver (series 2/  image 69), previously 5.4 x 4.0 cm  Musculoskeletal: Mild degenerative changes at L5-S1.  IMPRESSION:  Peritoneal/omental disease, grossly unchanged. Trace pelvic  fluid/stranding.  No evidence of metastatic disease in the chest.  Status post right mastectomy with right  axillary lymph node  dissection.     Impression and Plan:  58 year old woman with the following issues:  1. Peritoneal carcinomatosis with omental involvement. The pathology confirmed the presence of adenocarcinoma of likely GYN etiology. Her CA 125 was elevated at 333.5 on 09/19/2012. She is status post systemic chemotherapy with an excellent response utilizing carboplatin and Taxotere. This therapy was held in June of 2015 and currently on Avastin maintenance. Her most recent CA-125 was 53.  CT scan from 12/15/2013 revealed relatively stable disease. The plan is to continue with a Avastin maintenance to disease progression.  2. Neutropenia prophylaxis: This will not be needed with a Avastin.  3. IV access: She had a Port-A-Cath inserted without any complications.  4. Thrombocytopenia: Her platelets are continued to be improving.  5. Epistaxis: Likely related to her thrombocytopenia which have resolved at this time.  6. Genetic counseling: Referral has been made in the past.  7. The followup: She will followup in 3 weeks for her next cycle of a Avastin.    Raymone Pembroke E, PA-C  11/22/20151:19 PM

## 2014-01-10 NOTE — Patient Instructions (Signed)
Follow-up in 3 weeks

## 2014-01-27 ENCOUNTER — Other Ambulatory Visit: Payer: Self-pay | Admitting: Oncology

## 2014-01-28 ENCOUNTER — Ambulatory Visit (HOSPITAL_BASED_OUTPATIENT_CLINIC_OR_DEPARTMENT_OTHER): Payer: BC Managed Care – PPO | Admitting: Oncology

## 2014-01-28 ENCOUNTER — Ambulatory Visit: Payer: BC Managed Care – PPO

## 2014-01-28 ENCOUNTER — Other Ambulatory Visit (HOSPITAL_BASED_OUTPATIENT_CLINIC_OR_DEPARTMENT_OTHER): Payer: BC Managed Care – PPO

## 2014-01-28 ENCOUNTER — Telehealth: Payer: Self-pay | Admitting: Oncology

## 2014-01-28 ENCOUNTER — Other Ambulatory Visit (HOSPITAL_BASED_OUTPATIENT_CLINIC_OR_DEPARTMENT_OTHER): Payer: BC Managed Care – PPO | Admitting: Oncology

## 2014-01-28 ENCOUNTER — Ambulatory Visit (HOSPITAL_BASED_OUTPATIENT_CLINIC_OR_DEPARTMENT_OTHER): Payer: BC Managed Care – PPO

## 2014-01-28 VITALS — BP 129/75 | HR 83 | Temp 97.4°F | Resp 18 | Ht 62.0 in | Wt 159.4 lb

## 2014-01-28 DIAGNOSIS — R180 Malignant ascites: Secondary | ICD-10-CM

## 2014-01-28 DIAGNOSIS — C569 Malignant neoplasm of unspecified ovary: Secondary | ICD-10-CM

## 2014-01-28 DIAGNOSIS — C768 Malignant neoplasm of other specified ill-defined sites: Secondary | ICD-10-CM

## 2014-01-28 DIAGNOSIS — D696 Thrombocytopenia, unspecified: Secondary | ICD-10-CM

## 2014-01-28 DIAGNOSIS — Z5112 Encounter for antineoplastic immunotherapy: Secondary | ICD-10-CM

## 2014-01-28 DIAGNOSIS — Z79899 Other long term (current) drug therapy: Secondary | ICD-10-CM

## 2014-01-28 DIAGNOSIS — G609 Hereditary and idiopathic neuropathy, unspecified: Secondary | ICD-10-CM

## 2014-01-28 LAB — CBC WITH DIFFERENTIAL/PLATELET
BASO%: 0.6 % (ref 0.0–2.0)
BASOS ABS: 0 10*3/uL (ref 0.0–0.1)
EOS ABS: 0.1 10*3/uL (ref 0.0–0.5)
EOS%: 2.9 % (ref 0.0–7.0)
HCT: 38.1 % (ref 34.8–46.6)
HEMOGLOBIN: 11.7 g/dL (ref 11.6–15.9)
LYMPH%: 41.5 % (ref 14.0–49.7)
MCH: 29.1 pg (ref 25.1–34.0)
MCHC: 30.6 g/dL — ABNORMAL LOW (ref 31.5–36.0)
MCV: 94.9 fL (ref 79.5–101.0)
MONO#: 0.4 10*3/uL (ref 0.1–0.9)
MONO%: 12.3 % (ref 0.0–14.0)
NEUT%: 42.7 % (ref 38.4–76.8)
NEUTROS ABS: 1.4 10*3/uL — AB (ref 1.5–6.5)
Platelets: 73 10*3/uL — ABNORMAL LOW (ref 145–400)
RBC: 4.02 10*6/uL (ref 3.70–5.45)
RDW: 14.7 % — ABNORMAL HIGH (ref 11.2–14.5)
WBC: 3.3 10*3/uL — ABNORMAL LOW (ref 3.9–10.3)
lymph#: 1.4 10*3/uL (ref 0.9–3.3)

## 2014-01-28 LAB — COMPREHENSIVE METABOLIC PANEL (CC13)
ALK PHOS: 59 U/L (ref 40–150)
ALT: 12 U/L (ref 0–55)
AST: 15 U/L (ref 5–34)
Albumin: 3.3 g/dL — ABNORMAL LOW (ref 3.5–5.0)
Anion Gap: 9 mEq/L (ref 3–11)
BILIRUBIN TOTAL: 0.23 mg/dL (ref 0.20–1.20)
BUN: 15.8 mg/dL (ref 7.0–26.0)
CO2: 28 mEq/L (ref 22–29)
Calcium: 9.3 mg/dL (ref 8.4–10.4)
Chloride: 107 mEq/L (ref 98–109)
Creatinine: 0.8 mg/dL (ref 0.6–1.1)
EGFR: 87 mL/min/{1.73_m2} — AB (ref >90)
GLUCOSE: 77 mg/dL (ref 70–140)
Potassium: 4.5 mEq/L (ref 3.5–5.1)
Sodium: 143 mEq/L (ref 136–145)
Total Protein: 7.4 g/dL (ref 6.4–8.3)

## 2014-01-28 LAB — UA PROTEIN, DIPSTICK - CHCC: Protein, ur: 30 mg/dL

## 2014-01-28 MED ORDER — HEPARIN SOD (PORK) LOCK FLUSH 100 UNIT/ML IV SOLN
500.0000 [IU] | Freq: Once | INTRAVENOUS | Status: AC | PRN
  Administered 2014-01-28: 500 [IU]
  Filled 2014-01-28: qty 5

## 2014-01-28 MED ORDER — SODIUM CHLORIDE 0.9 % IV SOLN
Freq: Once | INTRAVENOUS | Status: AC
  Administered 2014-01-28: 12:00:00 via INTRAVENOUS

## 2014-01-28 MED ORDER — SODIUM CHLORIDE 0.9 % IV SOLN
15.0000 mg/kg | Freq: Once | INTRAVENOUS | Status: AC
  Administered 2014-01-28: 1150 mg via INTRAVENOUS
  Filled 2014-01-28: qty 46

## 2014-01-28 MED ORDER — SODIUM CHLORIDE 0.9 % IJ SOLN
10.0000 mL | INTRAMUSCULAR | Status: DC | PRN
  Administered 2014-01-28: 10 mL
  Filled 2014-01-28: qty 10

## 2014-01-28 NOTE — Progress Notes (Signed)
Urine Protein negative

## 2014-01-28 NOTE — Patient Instructions (Signed)
[facility] Discharge Instructions for Patients Receiving Chemotherapy  Today you received the following chemotherapy agents Avastin  To help prevent nausea and vomiting after your treatment, we encourage you to take your nausea medication Zofran.   If you develop nausea and vomiting that is not controlled by your nausea medication, call the clinic.   BELOW ARE SYMPTOMS THAT SHOULD BE REPORTED IMMEDIATELY:  *FEVER GREATER THAN 100.5 F  *CHILLS WITH OR WITHOUT FEVER  NAUSEA AND VOMITING THAT IS NOT CONTROLLED WITH YOUR NAUSEA MEDICATION  *UNUSUAL SHORTNESS OF BREATH  *UNUSUAL BRUISING OR BLEEDING  TENDERNESS IN MOUTH AND THROAT WITH OR WITHOUT PRESENCE OF ULCERS  *URINARY PROBLEMS  *BOWEL PROBLEMS  UNUSUAL RASH Items with * indicate a potential emergency and should be followed up as soon as possible.  Feel free to call the clinic you have any questions or concerns. The clinic phone number is [phone].

## 2014-01-28 NOTE — Telephone Encounter (Signed)
gv adn printed appt sched and avs for pt fo rDEC adn Jan 2016 °

## 2014-01-28 NOTE — Progress Notes (Signed)
Hematology and Oncology Follow Up Visit  [name] [id number] 16-Apr-1955 58 y.o. 01/28/2014 10:27 AM Julie Levering, PA-CWillard, [name], PA-C   Principle Diagnosis: 58 year old woman diagnosed with peritoneal carcinomatosis and ascites that is biopsy proven to be adenocarcinoma  of a GYN etiology. This was diagnosed in July of 2014.   Prior Therapy:  She is status post paracentesis performed on 09/17/2012 with the cytology confirmed the presence of adenocarcinoma and immunohistochemical stains suggest GYN etiology. She is also status post lumpectomy for breast cancer diagnosed 25 years ago followed by radiation and chemotherapy under the care of Dr. Sonny Padilla. She did not receive any hormonal therapy. Chemotherapy utilizing carboplatin and Taxotere started on 10/01/2012. Avastin was added with cycle 4. This was discontinued in June of 2015.  Current therapy:  She is currently on a Avastin alone after chemotherapy was discontinued in June of 2015.  Interim History: [name] presents today for a followup visit with her husband. Since her last visit, she feels about the same. She is a bit more active and involved in the church choir and might have increased fatigue but in the today. She is a bit more active and more functional. She denied any fever chills.   She has not reported any complications related to a Avastin. She's had no bleeding or bruising. She did not report any exertional dyspnea but reveals that her exercise tolerance have declined.  Her mood continues to improve compared to previous visits. Has not reported any shortness of breath or cough. She denied any pain with swallowing.She denied nausea  fever and chills.  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 Her appetite has improved and her weight is relatively stable.  She has not any shortness of breath or chest pain. She denied any GYN bleeding. She has not reported any recent complications or  illnesses. She has not reported any increase in her abdominal pain or early satiety. She also reports no hematochezia or melena. She has not reported any syncope alteration of mental status. Had not report any abdominal distention. Remainder of the review of systems is unremarkable.  Medications: I have reviewed the patient's current medications.  Current Outpatient Prescriptions  Medication Sig Dispense Refill  . ALPRAZolam (XANAX) 1 MG tablet Take 1 mg by mouth at bedtime as needed for anxiety.    . divalproex (DEPAKOTE ER) 500 MG 24 hr tablet Take 1,500 mg by mouth at bedtime.     . gabapentin (NEURONTIN) 100 MG capsule TAKE ONE CAPSULE BY MOUTH 3 TIMES A DAY 90 capsule 1  . lamoTRIgine (LAMICTAL) 150 MG tablet Take 175 mg by mouth daily.     Marland Kitchen levothyroxine (SYNTHROID, LEVOTHROID) 100 MCG tablet Take 100 mcg by mouth daily before breakfast.     . lidocaine-prilocaine (EMLA) cream Apply topically as needed. Apply to port with every chemotherapy. 30 g 1  . OLANZapine-FLUoxetine (SYMBYAX) 6-25 MG per capsule     . ondansetron (ZOFRAN) 8 MG tablet Take 1 tablet (8 mg total) by mouth every 8 (eight) hours as needed for nausea or vomiting. 20 tablet 1  . rOPINIRole (REQUIP) 1 MG tablet Take 1 mg by mouth at bedtime.     No current facility-administered medications for this visit.     Allergies: No Known Allergies  Past Medical History, Surgical history, Social history, and Family History were reviewed and updated.   Physical Exam: Blood pressure 129/75, pulse 83, temperature 97.4 F (36.3 C), temperature source Oral, resp. rate  18, height 5\' 2"  (1.575 m), weight 159 lb 6.4 oz (72.303 kg).  ECOG: 1  General appearance: awake alert woman with appropriate affect.  Head: Normocephalic, without obvious abnormality Neck: no adenopathy Lymph nodes: Cervical, supraclavicular, and axillary nodes normal. Heart:regular rate and rhythm, S1, S2 normal, no murmur, click, rub or gallop Lung:chest  clear, no wheezing, rales, normal symmetric air entry Abdomin: soft, distended and shifting dullness noted.  good bowel sounds without any shifting dullness.  EXT:no erythema, induration, or nodules. She had good pulses and capillary refill. Skin: No rashes or lesions.  Lab Results: Lab Results  Component Value Date   WBC 3.3* 01/28/2014   HGB 11.7 01/28/2014   HCT 38.1 01/28/2014   MCV 94.9 01/28/2014   PLT 73* 01/28/2014     Chemistry      Component Value Date/Time   NA 140 01/07/2014 1118   NA 136 07/03/2012 1359   K 4.6 01/07/2014 1118   K 4.1 07/03/2012 1359   CL 103 07/03/2012 1359   CO2 26 01/07/2014 1118   CO2 30 07/03/2012 1359   BUN 16.3 01/07/2014 1118   BUN 9 07/03/2012 1359   CREATININE 0.7 01/07/2014 1118   CREATININE 0.7 07/03/2012 1359      Component Value Date/Time   CALCIUM 9.4 01/07/2014 1118   CALCIUM 9.6 07/03/2012 1359   ALKPHOS 63 01/07/2014 1118   ALKPHOS 80 07/03/2012 1359   AST 18 01/07/2014 1118   AST 42* 07/03/2012 1359   ALT 10 01/07/2014 1118   ALT 30 07/03/2012 1359   BILITOT 0.33 01/07/2014 1118   BILITOT 0.5 07/03/2012 1359     Results for Julie, Padilla (MRN [id number]) as of 01/28/2014 10:13  Ref. Range 11/05/2013 09:43 11/26/2013 11:37 12/15/2013 10:08 01/07/2014 11:17  CA 125 Latest Range: <35 U/mL 41.6 (H) 54 (H) 62 (H) 53 (H)    Impression and Plan:  58 year old woman with the following issues:  1. Peritoneal carcinomatosis with omental involvement. The pathology confirmed the presence of adenocarcinoma of likely GYN etiology. Her CA 125 was elevated at 333.5 on 09/19/2012. She is status post systemic chemotherapy with an excellent response utilizing carboplatin and Taxotere and a Avastin . This therapy was held in June of 2015 and currently on Avastin maintenance. Her most recent CA-125 was 53.  CT scan from 12/15/2013 revealed relatively stable disease. The plan is to continue with a Avastin maintenance to disease  progression. plan on repeating imaging studies likely in February 2016.  2. Neutropenia prophylaxis: no need for growth factor support at this time.   3. IV access: She had a Port-A-Cath inserted without any complications.  4. Thrombocytopenia: Her platelets are continued to be improving. No bleeding noted.  5. Genetic counseling: Referral has been made in the past. Her mother is currently being evaluated by our genetic counselor and I continued to address that with her. She said that she will think about it at this time and she will continue to consider it.   6. The followup: She will followup in 3 weeks for her next cycle of a Avastin.    [id number],ESPQZ, MD 12/10/201510:27 AM

## 2014-01-28 NOTE — Progress Notes (Signed)
Reviewed labs. Noted ANC to be 1.4. OK to treat per Dr. Alen Blew.  Urine sample obtained for urine protein. Urine porotein @ 30. OK to treat per protocol.

## 2014-01-29 LAB — CA 125: CA 125: 48 U/mL — AB (ref <35)

## 2014-02-18 ENCOUNTER — Other Ambulatory Visit (HOSPITAL_BASED_OUTPATIENT_CLINIC_OR_DEPARTMENT_OTHER): Payer: BC Managed Care – PPO

## 2014-02-18 ENCOUNTER — Ambulatory Visit (HOSPITAL_BASED_OUTPATIENT_CLINIC_OR_DEPARTMENT_OTHER): Payer: BC Managed Care – PPO | Admitting: Physician Assistant

## 2014-02-18 ENCOUNTER — Ambulatory Visit (HOSPITAL_BASED_OUTPATIENT_CLINIC_OR_DEPARTMENT_OTHER): Payer: BC Managed Care – PPO

## 2014-02-18 ENCOUNTER — Encounter: Payer: Self-pay | Admitting: Physician Assistant

## 2014-02-18 VITALS — BP 126/67 | HR 71 | Temp 97.6°F | Resp 18 | Ht 62.0 in | Wt 160.4 lb

## 2014-02-18 DIAGNOSIS — C801 Malignant (primary) neoplasm, unspecified: Secondary | ICD-10-CM

## 2014-02-18 DIAGNOSIS — C569 Malignant neoplasm of unspecified ovary: Secondary | ICD-10-CM

## 2014-02-18 DIAGNOSIS — Z5112 Encounter for antineoplastic immunotherapy: Secondary | ICD-10-CM

## 2014-02-18 DIAGNOSIS — C786 Secondary malignant neoplasm of retroperitoneum and peritoneum: Secondary | ICD-10-CM

## 2014-02-18 LAB — COMPREHENSIVE METABOLIC PANEL (CC13)
ALT: 12 U/L (ref 0–55)
ANION GAP: 7 meq/L (ref 3–11)
AST: 20 U/L (ref 5–34)
Albumin: 3.5 g/dL (ref 3.5–5.0)
Alkaline Phosphatase: 59 U/L (ref 40–150)
BILIRUBIN TOTAL: 0.43 mg/dL (ref 0.20–1.20)
BUN: 15.8 mg/dL (ref 7.0–26.0)
CALCIUM: 9.2 mg/dL (ref 8.4–10.4)
CO2: 28 meq/L (ref 22–29)
CREATININE: 0.8 mg/dL (ref 0.6–1.1)
Chloride: 106 mEq/L (ref 98–109)
EGFR: 82 mL/min/{1.73_m2} — ABNORMAL LOW (ref >90)
Glucose: 96 mg/dl (ref 70–140)
Potassium: 4.6 mEq/L (ref 3.5–5.1)
SODIUM: 142 meq/L (ref 136–145)
Total Protein: 7.6 g/dL (ref 6.4–8.3)

## 2014-02-18 LAB — CBC WITH DIFFERENTIAL/PLATELET
BASO%: 0.4 % (ref 0.0–2.0)
BASOS ABS: 0 10*3/uL (ref 0.0–0.1)
EOS ABS: 0.1 10*3/uL (ref 0.0–0.5)
EOS%: 3.6 % (ref 0.0–7.0)
HCT: 39.5 % (ref 34.8–46.6)
HEMOGLOBIN: 12 g/dL (ref 11.6–15.9)
LYMPH#: 1.4 10*3/uL (ref 0.9–3.3)
LYMPH%: 37.6 % (ref 14.0–49.7)
MCH: 28.9 pg (ref 25.1–34.0)
MCHC: 30.4 g/dL — ABNORMAL LOW (ref 31.5–36.0)
MCV: 95.1 fL (ref 79.5–101.0)
MONO#: 0.4 10*3/uL (ref 0.1–0.9)
MONO%: 11.3 % (ref 0.0–14.0)
NEUT%: 47.1 % (ref 38.4–76.8)
NEUTROS ABS: 1.7 10*3/uL (ref 1.5–6.5)
Platelets: 82 10*3/uL — ABNORMAL LOW (ref 145–400)
RBC: 4.16 10*6/uL (ref 3.70–5.45)
RDW: 14.8 % — AB (ref 11.2–14.5)
WBC: 3.6 10*3/uL — AB (ref 3.9–10.3)

## 2014-02-18 MED ORDER — HEPARIN SOD (PORK) LOCK FLUSH 100 UNIT/ML IV SOLN
500.0000 [IU] | Freq: Once | INTRAVENOUS | Status: AC | PRN
  Administered 2014-02-18: 500 [IU]
  Filled 2014-02-18: qty 5

## 2014-02-18 MED ORDER — SODIUM CHLORIDE 0.9 % IV SOLN
Freq: Once | INTRAVENOUS | Status: AC
  Administered 2014-02-18: 15:00:00 via INTRAVENOUS

## 2014-02-18 MED ORDER — SODIUM CHLORIDE 0.9 % IV SOLN
15.0000 mg/kg | Freq: Once | INTRAVENOUS | Status: AC
  Administered 2014-02-18: 1150 mg via INTRAVENOUS
  Filled 2014-02-18: qty 46

## 2014-02-18 MED ORDER — SODIUM CHLORIDE 0.9 % IJ SOLN
10.0000 mL | INTRAMUSCULAR | Status: DC | PRN
  Administered 2014-02-18: 10 mL
  Filled 2014-02-18: qty 10

## 2014-02-18 NOTE — Progress Notes (Signed)
Hematology and Oncology Follow Up Visit  [name] [id number] 10-31-1955 58 y.o. 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 This was diagnosed in July of 2014.   Prior Therapy:  She is status post paracentesis performed on 09/17/2012 with the cytology confirmed the presence of adenocarcinoma and immunohistochemical stains suggest GYN etiology. She is also status post lumpectomy for breast cancer diagnosed 25 years ago followed by radiation and chemotherapy under the care of Dr. Sonny Dandy. She did not receive any hormonal therapy. Chemotherapy utilizing carboplatin and Taxotere started on 10/01/2012. Avastin was added with cycle 4. This was discontinued in June of 2015.  Current therapy:  She is currently on a Avastin alone after chemotherapy was discontinued in June of 2015.  Interim History: [name] presents today for a followup visit with her husband. Since her last visit, she feels about the same except for complaints of increased indigestion. She's been taking Tums with some improvement in her symptoms. She does admit that that her indigestion has an acid reflux-type component. Her activity level remains stable.  She is a bit more active and more functional. She denied any fever chills.   She has not reported any complications related to a Avastin. She's had no bleeding or bruising. She did not report any exertional dyspnea but reveals that her exercise tolerance have declined.  Her mood continues to improve compared to previous visits. Has not reported any shortness of breath or cough. She denied any pain with swallowing.She denied nausea  fever and chills.  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 Her appetite has improved and her weight is relatively stable.  She has not any shortness  of breath or chest pain. She denied any GYN bleeding. She has not reported any recent complications or illnesses. She has not reported any increase in her abdominal pain or early satiety. She also reports no hematochezia or melena. She has not reported any syncope alteration of mental status. Had not report any abdominal distention. Remainder of the review of systems is unremarkable.  Medications: I have reviewed the patient's current medications.  Current Outpatient Prescriptions  Medication Sig Dispense Refill  . ALPRAZolam (XANAX) 1 MG tablet Take 1 mg by mouth at bedtime as needed for anxiety.    . divalproex (DEPAKOTE ER) 500 MG 24 hr tablet Take 1,500 mg by mouth at bedtime.     . gabapentin (NEURONTIN) 100 MG capsule TAKE ONE CAPSULE BY MOUTH 3 TIMES A DAY 90 capsule 1  . lamoTRIgine (LAMICTAL) 200 MG tablet Take 200 mg by mouth at bedtime.  1  . levothyroxine (SYNTHROID, LEVOTHROID) 100 MCG tablet Take 100 mcg by mouth daily before breakfast.     . lidocaine-prilocaine (EMLA) cream Apply topically as needed. Apply to port with every chemotherapy. 30 g 1  . OLANZapine-FLUoxetine (SYMBYAX) 6-25 MG per capsule     . ondansetron (ZOFRAN) 8 MG tablet Take 1 tablet (8 mg total) by mouth every 8 (eight) hours as needed for nausea or vomiting. 20 tablet 1  . rOPINIRole (REQUIP) 1 MG tablet Take 1 mg by mouth at bedtime.     No current facility-administered medications for this visit.   Facility-Administered Medications Ordered in Other Visits  Medication Dose Route Frequency Provider Last Rate Last Dose  . sodium chloride 0.9 % injection 10 mL  10  mL Intracatheter PRN Wyatt Portela, MD   10 mL at 02/18/14 1610     Allergies: No Known Allergies  Past Medical History, Surgical history, Social history, and Family History were reviewed and updated.   Physical Exam: Blood pressure 126/67, pulse 71, temperature 97.6 F (36.4 C), temperature source Oral, resp. rate 18, height 5\' 2"  (1.575 m),  weight 160 lb 6.4 oz (72.757 kg), SpO2 100 %.  ECOG: 1  General appearance: awake alert woman with appropriate affect.  Head: Normocephalic, without obvious abnormality Neck: no adenopathy Lymph nodes: Cervical, supraclavicular, and axillary nodes normal. Heart:regular rate and rhythm, S1, S2 normal, no murmur, click, rub or gallop Lung:chest clear, no wheezing, rales, normal symmetric air entry Abdomin: soft, distended and shifting dullness noted.  good bowel sounds without any shifting dullness.  EXT:no erythema, induration, or nodules. She had good pulses and capillary refill. Skin: No rashes or lesions.  Lab Results: Lab Results  Component Value Date   WBC 3.6* 02/18/2014   HGB 12.0 02/18/2014   HCT 39.5 02/18/2014   MCV 95.1 02/18/2014   PLT 82* 02/18/2014     Chemistry      Component Value Date/Time   NA 142 02/18/2014 1310   NA 136 07/03/2012 1359   K 4.6 02/18/2014 1310   K 4.1 07/03/2012 1359   CL 103 07/03/2012 1359   CO2 28 02/18/2014 1310   CO2 30 07/03/2012 1359   BUN 15.8 02/18/2014 1310   BUN 9 07/03/2012 1359   CREATININE 0.8 02/18/2014 1310   CREATININE 0.7 07/03/2012 1359      Component Value Date/Time   CALCIUM 9.2 02/18/2014 1310   CALCIUM 9.6 07/03/2012 1359   ALKPHOS 59 02/18/2014 1310   ALKPHOS 80 07/03/2012 1359   AST 20 02/18/2014 1310   AST 42* 07/03/2012 1359   ALT 12 02/18/2014 1310   ALT 30 07/03/2012 1359   BILITOT 0.43 02/18/2014 1310   BILITOT 0.5 07/03/2012 1359     Results for Julie, MCCLEESE (MRN [id number]) as of 01/28/2014 10:13  Ref. Range 11/05/2013 09:43 11/26/2013 11:37 12/15/2013 10:08 01/07/2014 11:17  CA 125 Latest Range: <35 U/mL 41.6 (H) 54 (H) 62 (H) 53 (H)    Impression and Plan:  58 year old woman with the following issues:  1. Peritoneal carcinomatosis with omental involvement. The pathology confirmed the presence of adenocarcinoma of likely GYN etiology. Her CA 125 was elevated at 333.5 on 09/19/2012. She is  status post systemic chemotherapy with an excellent response utilizing carboplatin and Taxotere and a Avastin . This therapy was held in June of 2015 and currently on Avastin maintenance. Her most recent CA-125 was 48.  CT scan from 12/15/2013 revealed relatively stable disease. The plan is to continue with a Avastin maintenance to disease progression. plan on repeating imaging studies likely in February 2016.  2. Neutropenia prophylaxis: no need for growth factor support at this time.   3. IV access: She had a Port-A-Cath inserted without any complications.  4. Thrombocytopenia: Her platelets are continued to be improving. No bleeding noted. Her platelet count is now up to 82,000  5. Genetic counseling: Referral has been made in the past. Her mother is currently being evaluated by our genetic counselor and I continued to address that with her. She said that she will think about it at this time and she will continue to consider it.   6. Indigestion/acid reflux: The patient was advised to try one of the over-the-counter proton pump inhibitor such  as Prilosec or Prevacid. She may continue to use Tums as needed.  7. The followup: She will followup in 3 weeks for her next cycle of a Avastin.    Wynetta Emery, Willow Shidler E, PA-C  12/31/20155:39 PM

## 2014-02-18 NOTE — Patient Instructions (Signed)
Try taking Prilosec or Prevacid as discussed 30 minutes to 1 hour before you first meal of the day. He may continued to use Tums as needed Follow-up in 3 weeks

## 2014-02-18 NOTE — Patient Instructions (Addendum)
[facility] Discharge Instructions for Patients Receiving Chemotherapy  Today you received the following chemotherapy agents Avastin.  To help prevent nausea and vomiting after your treatment, we encourage you to take your nausea medication as prescribed.   If you develop nausea and vomiting that is not controlled by your nausea medication, call the clinic.   BELOW ARE SYMPTOMS THAT SHOULD BE REPORTED IMMEDIATELY:  *FEVER GREATER THAN 100.5 F  *CHILLS WITH OR WITHOUT FEVER  NAUSEA AND VOMITING THAT IS NOT CONTROLLED WITH YOUR NAUSEA MEDICATION  *UNUSUAL SHORTNESS OF BREATH  *UNUSUAL BRUISING OR BLEEDING  TENDERNESS IN MOUTH AND THROAT WITH OR WITHOUT PRESENCE OF ULCERS  *URINARY PROBLEMS  *BOWEL PROBLEMS  UNUSUAL RASH Items with * indicate a potential emergency and should be followed up as soon as possible.  Feel free to call the clinic you have any questions or concerns. The clinic phone number is [phone].    

## 2014-02-19 LAB — CA 125: CA 125: 52 U/mL — ABNORMAL HIGH (ref <35)

## 2014-03-02 ENCOUNTER — Encounter: Payer: Self-pay | Admitting: Oncology

## 2014-03-11 ENCOUNTER — Telehealth: Payer: Self-pay | Admitting: Oncology

## 2014-03-11 ENCOUNTER — Other Ambulatory Visit (HOSPITAL_BASED_OUTPATIENT_CLINIC_OR_DEPARTMENT_OTHER): Payer: BLUE CROSS/BLUE SHIELD

## 2014-03-11 ENCOUNTER — Ambulatory Visit (HOSPITAL_BASED_OUTPATIENT_CLINIC_OR_DEPARTMENT_OTHER): Payer: BLUE CROSS/BLUE SHIELD | Admitting: Oncology

## 2014-03-11 ENCOUNTER — Ambulatory Visit (HOSPITAL_BASED_OUTPATIENT_CLINIC_OR_DEPARTMENT_OTHER): Payer: BLUE CROSS/BLUE SHIELD

## 2014-03-11 ENCOUNTER — Telehealth: Payer: Self-pay | Admitting: *Deleted

## 2014-03-11 VITALS — BP 141/64 | HR 62 | Temp 98.1°F | Resp 18 | Ht 62.0 in | Wt 160.0 lb

## 2014-03-11 DIAGNOSIS — C768 Malignant neoplasm of other specified ill-defined sites: Secondary | ICD-10-CM

## 2014-03-11 DIAGNOSIS — C569 Malignant neoplasm of unspecified ovary: Secondary | ICD-10-CM

## 2014-03-11 DIAGNOSIS — Z5112 Encounter for antineoplastic immunotherapy: Secondary | ICD-10-CM

## 2014-03-11 DIAGNOSIS — R180 Malignant ascites: Secondary | ICD-10-CM

## 2014-03-11 LAB — COMPREHENSIVE METABOLIC PANEL (CC13)
ALT: 13 U/L (ref 0–55)
AST: 22 U/L (ref 5–34)
Albumin: 3.5 g/dL (ref 3.5–5.0)
Alkaline Phosphatase: 67 U/L (ref 40–150)
Anion Gap: 7 mEq/L (ref 3–11)
BILIRUBIN TOTAL: 0.31 mg/dL (ref 0.20–1.20)
BUN: 13.3 mg/dL (ref 7.0–26.0)
CHLORIDE: 105 meq/L (ref 98–109)
CO2: 30 meq/L — AB (ref 22–29)
Calcium: 9.3 mg/dL (ref 8.4–10.4)
Creatinine: 0.8 mg/dL (ref 0.6–1.1)
EGFR: 88 mL/min/{1.73_m2} — AB (ref >90)
Glucose: 82 mg/dl (ref 70–140)
Potassium: 5.4 mEq/L — ABNORMAL HIGH (ref 3.5–5.1)
Sodium: 142 mEq/L (ref 136–145)
Total Protein: 7.7 g/dL (ref 6.4–8.3)

## 2014-03-11 LAB — CBC WITH DIFFERENTIAL/PLATELET
BASO%: 0.6 % (ref 0.0–2.0)
Basophils Absolute: 0 10*3/uL (ref 0.0–0.1)
EOS%: 2.9 % (ref 0.0–7.0)
Eosinophils Absolute: 0.1 10*3/uL (ref 0.0–0.5)
HCT: 39.4 % (ref 34.8–46.6)
HEMOGLOBIN: 12.1 g/dL (ref 11.6–15.9)
LYMPH%: 41.5 % (ref 14.0–49.7)
MCH: 29 pg (ref 25.1–34.0)
MCHC: 30.6 g/dL — ABNORMAL LOW (ref 31.5–36.0)
MCV: 94.6 fL (ref 79.5–101.0)
MONO#: 0.4 10*3/uL (ref 0.1–0.9)
MONO%: 13.4 % (ref 0.0–14.0)
NEUT#: 1.4 10*3/uL — ABNORMAL LOW (ref 1.5–6.5)
NEUT%: 41.6 % (ref 38.4–76.8)
PLATELETS: 74 10*3/uL — AB (ref 145–400)
RBC: 4.16 10*6/uL (ref 3.70–5.45)
RDW: 14.9 % — ABNORMAL HIGH (ref 11.2–14.5)
WBC: 3.4 10*3/uL — ABNORMAL LOW (ref 3.9–10.3)
lymph#: 1.4 10*3/uL (ref 0.9–3.3)

## 2014-03-11 MED ORDER — SODIUM CHLORIDE 0.9 % IJ SOLN
10.0000 mL | INTRAMUSCULAR | Status: DC | PRN
  Administered 2014-03-11: 10 mL
  Filled 2014-03-11: qty 10

## 2014-03-11 MED ORDER — HEPARIN SOD (PORK) LOCK FLUSH 100 UNIT/ML IV SOLN
500.0000 [IU] | Freq: Once | INTRAVENOUS | Status: AC | PRN
  Administered 2014-03-11: 500 [IU]
  Filled 2014-03-11: qty 5

## 2014-03-11 MED ORDER — SODIUM CHLORIDE 0.9 % IV SOLN
14.5000 mg/kg | Freq: Once | INTRAVENOUS | Status: AC
  Administered 2014-03-11: 1100 mg via INTRAVENOUS
  Filled 2014-03-11: qty 44

## 2014-03-11 MED ORDER — SODIUM CHLORIDE 0.9 % IV SOLN
Freq: Once | INTRAVENOUS | Status: AC
  Administered 2014-03-11: 15:00:00 via INTRAVENOUS

## 2014-03-11 NOTE — Progress Notes (Signed)
Hematology and Oncology Follow Up Visit  [name] [id number] May 12, 1955 59 y.o. 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 This was diagnosed in July of 2014.   Prior Therapy:  She is status post paracentesis performed on 09/17/2012 with the cytology confirmed the presence of adenocarcinoma and immunohistochemical stains suggest GYN etiology. She is also status post lumpectomy for breast cancer diagnosed 25 years ago followed by radiation and chemotherapy under the care of Dr. Sonny Padilla. She did not receive any hormonal therapy. Chemotherapy utilizing carboplatin and Taxotere started on 10/01/2012. Avastin was added with cycle 4. This was discontinued in June of 2015.  Current therapy:  She is currently on a Avastin alone after chemotherapy was discontinued in June of 2015.  Interim History: [name] presents today for a followup visit with her husband. Since her last visit, she continues to do very well. She did report loose bowel habits the last few days attributed to certain foods she ate. She also had some mild nausea but no vomiting. She is able to eat and maintain her weight without any difficulties. She denied any fever chills.  She has not reported any complications related to a Avastin. She's had no bleeding or bruising. She did not report any exertional dyspnea but reveals that her exercise tolerance have declined.  Her mood continues to stable. Has not reported any shortness of breath or cough. She denied any pain with swallowing.She denied nausea  fever and chills.  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  She has not any shortness of breath or chest pain. She denied any GYN bleeding. She has not reported any recent complications or illnesses. She has not reported any increase in  her abdominal pain or early satiety. She also reports no hematochezia or melena. She has not reported any syncope alteration of mental status. Had not report any abdominal distention. Remainder of the review of systems is unremarkable.  Medications: I have reviewed the patient's current medications.  Current Outpatient Prescriptions  Medication Sig Dispense Refill  . ALPRAZolam (XANAX) 1 MG tablet Take 1 mg by mouth at bedtime as needed for anxiety.    . divalproex (DEPAKOTE ER) 500 MG 24 hr tablet Take 1,500 mg by mouth at bedtime.     . gabapentin (NEURONTIN) 100 MG capsule TAKE ONE CAPSULE BY MOUTH 3 TIMES A DAY 90 capsule 1  . lamoTRIgine (LAMICTAL) 200 MG tablet Take 200 mg by mouth at bedtime.  1  . levothyroxine (SYNTHROID, LEVOTHROID) 100 MCG tablet Take 100 mcg by mouth daily before breakfast.     . lidocaine-prilocaine (EMLA) cream Apply topically as needed. Apply to port with every chemotherapy. 30 g 1  . OLANZapine-FLUoxetine (SYMBYAX) 6-25 MG per capsule     . ondansetron (ZOFRAN) 8 MG tablet Take 1 tablet (8 mg total) by mouth every 8 (eight) hours as needed for nausea or vomiting. 20 tablet 1  . rOPINIRole (REQUIP) 1 MG tablet Take 1 mg by mouth at bedtime.     No current facility-administered medications for this visit.     Allergies: No Known Allergies  Past Medical History, Surgical history, Social history, and Family History were reviewed and updated.   Physical Exam: Blood pressure 141/64, pulse 62, temperature 98.1 F (36.7 C), temperature source Oral, resp. rate 18, height 5\' 2"  (1.575 m), weight  160 lb (72.576 kg).  ECOG: 1  General appearance: awake alert pleasant woman with appropriate affect.  Head: Normocephalic, without obvious abnormality Neck: no adenopathy Lymph nodes: Cervical, supraclavicular, and axillary nodes normal. Heart:regular rate and rhythm, S1, S2 normal, no murmur, click, rub or gallop Lung:chest clear, no wheezing, rales, normal  symmetric air entry Abdomin: soft, distended and shifting dullness noted.  good bowel sounds without any shifting dullness.  EXT:no erythema, induration, or nodules. She had good pulses and capillary refill. Skin: No rashes or lesions.  Lab Results: Lab Results  Component Value Date   WBC 3.4* 03/11/2014   HGB 12.1 03/11/2014   HCT 39.4 03/11/2014   MCV 94.6 03/11/2014   PLT 74* 03/11/2014     Chemistry      Component Value Date/Time   NA 142 02/18/2014 1310   NA 136 07/03/2012 1359   K 4.6 02/18/2014 1310   K 4.1 07/03/2012 1359   CL 103 07/03/2012 1359   CO2 28 02/18/2014 1310   CO2 30 07/03/2012 1359   BUN 15.8 02/18/2014 1310   BUN 9 07/03/2012 1359   CREATININE 0.8 02/18/2014 1310   CREATININE 0.7 07/03/2012 1359      Component Value Date/Time   CALCIUM 9.2 02/18/2014 1310   CALCIUM 9.6 07/03/2012 1359   ALKPHOS 59 02/18/2014 1310   ALKPHOS 80 07/03/2012 1359   AST 20 02/18/2014 1310   AST 42* 07/03/2012 1359   ALT 12 02/18/2014 1310   ALT 30 07/03/2012 1359   BILITOT 0.43 02/18/2014 1310   BILITOT 0.5 07/03/2012 1359      Results for [name] (MRN [id number]) as of 03/11/2014 13:05  Ref. Range 01/07/2014 11:17 01/28/2014 09:56 02/18/2014 13:10  CA 125 Latest Range: <35 U/mL 53 (H) 48 (H) 52 (H)    Impression and Plan:  59 year old woman with the following issues:  1. Peritoneal carcinomatosis with omental involvement. The pathology confirmed the presence of adenocarcinoma of likely GYN etiology. Her CA 125 was elevated at 333.5 on 09/19/2012. She is status post systemic chemotherapy with an excellent response utilizing carboplatin and Taxotere and a Avastin . This therapy was held in June of 2015 and currently on Avastin maintenance. She continues to tolerate this well without any signs of progression of disease. Her tumor marker remains stable between 53 and 52. The plan is to repeat imaging studies in March 2016. This will be set up today.  2. IV  access: She had a Port-A-Cath inserted without any complications.  3. Thrombocytopenia: Her platelets are continued to be improving. No bleeding noted. Her platelet count is 74,000.  4. Genetic counseling: This has been discussed in the past we will continue to evaluate that in the future.  5. Indigestion/acid reflux: This have resolved at this time.  6. The followup: She will followup in 3 weeks for her next cycle of a Avastin.    Zola Button, MD 1/21/20161:19 PM

## 2014-03-11 NOTE — Telephone Encounter (Signed)
Pt confirmed labs/ov per 01/21 POF, gave pt AVS.... KJ, sent msg to add chemo °

## 2014-03-11 NOTE — Telephone Encounter (Signed)
Per staff message and POF I have scheduled appts. Advised scheduler of appts. JMW  

## 2014-03-11 NOTE — Patient Instructions (Signed)
[facility] Discharge Instructions for Patients Receiving Chemotherapy  Today you received the following chemotherapy agents avastin  To help prevent nausea and vomiting after your treatment, we encourage you to take your nausea medication if needed.   If you develop nausea and vomiting that is not controlled by your nausea medication, call the clinic.   BELOW ARE SYMPTOMS THAT SHOULD BE REPORTED IMMEDIATELY:  *FEVER GREATER THAN 100.5 F  *CHILLS WITH OR WITHOUT FEVER  NAUSEA AND VOMITING THAT IS NOT CONTROLLED WITH YOUR NAUSEA MEDICATION  *UNUSUAL SHORTNESS OF BREATH  *UNUSUAL BRUISING OR BLEEDING  TENDERNESS IN MOUTH AND THROAT WITH OR WITHOUT PRESENCE OF ULCERS  *URINARY PROBLEMS  *BOWEL PROBLEMS  UNUSUAL RASH Items with * indicate a potential emergency and should be followed up as soon as possible.  Feel free to call the clinic you have any questions or concerns. The clinic phone number is [phone].

## 2014-03-12 LAB — CA 125: CA 125: 55 U/mL — AB (ref <35)

## 2014-03-31 ENCOUNTER — Other Ambulatory Visit: Payer: Self-pay | Admitting: Oncology

## 2014-04-01 ENCOUNTER — Ambulatory Visit (HOSPITAL_BASED_OUTPATIENT_CLINIC_OR_DEPARTMENT_OTHER): Payer: BLUE CROSS/BLUE SHIELD

## 2014-04-01 ENCOUNTER — Ambulatory Visit (HOSPITAL_BASED_OUTPATIENT_CLINIC_OR_DEPARTMENT_OTHER): Payer: BLUE CROSS/BLUE SHIELD | Admitting: Physician Assistant

## 2014-04-01 ENCOUNTER — Encounter: Payer: Self-pay | Admitting: Physician Assistant

## 2014-04-01 ENCOUNTER — Encounter: Payer: Self-pay | Admitting: *Deleted

## 2014-04-01 ENCOUNTER — Other Ambulatory Visit (HOSPITAL_BASED_OUTPATIENT_CLINIC_OR_DEPARTMENT_OTHER): Payer: BLUE CROSS/BLUE SHIELD

## 2014-04-01 VITALS — BP 139/75 | HR 75 | Temp 97.7°F | Resp 20 | Ht 62.0 in | Wt 164.0 lb

## 2014-04-01 DIAGNOSIS — C569 Malignant neoplasm of unspecified ovary: Secondary | ICD-10-CM

## 2014-04-01 DIAGNOSIS — R197 Diarrhea, unspecified: Secondary | ICD-10-CM

## 2014-04-01 DIAGNOSIS — R110 Nausea: Secondary | ICD-10-CM

## 2014-04-01 DIAGNOSIS — C768 Malignant neoplasm of other specified ill-defined sites: Secondary | ICD-10-CM

## 2014-04-01 DIAGNOSIS — Z79899 Other long term (current) drug therapy: Secondary | ICD-10-CM

## 2014-04-01 DIAGNOSIS — C786 Secondary malignant neoplasm of retroperitoneum and peritoneum: Secondary | ICD-10-CM

## 2014-04-01 DIAGNOSIS — D696 Thrombocytopenia, unspecified: Secondary | ICD-10-CM

## 2014-04-01 DIAGNOSIS — Z5112 Encounter for antineoplastic immunotherapy: Secondary | ICD-10-CM

## 2014-04-01 LAB — CBC WITH DIFFERENTIAL/PLATELET
BASO%: 0.5 % (ref 0.0–2.0)
Basophils Absolute: 0 10*3/uL (ref 0.0–0.1)
EOS%: 4.8 % (ref 0.0–7.0)
Eosinophils Absolute: 0.2 10*3/uL (ref 0.0–0.5)
HEMATOCRIT: 37.1 % (ref 34.8–46.6)
HGB: 11.4 g/dL — ABNORMAL LOW (ref 11.6–15.9)
LYMPH%: 42.7 % (ref 14.0–49.7)
MCH: 28.8 pg (ref 25.1–34.0)
MCHC: 30.6 g/dL — AB (ref 31.5–36.0)
MCV: 94 fL (ref 79.5–101.0)
MONO#: 0.4 10*3/uL (ref 0.1–0.9)
MONO%: 12.3 % (ref 0.0–14.0)
NEUT#: 1.3 10*3/uL — ABNORMAL LOW (ref 1.5–6.5)
NEUT%: 39.7 % (ref 38.4–76.8)
PLATELETS: 67 10*3/uL — AB (ref 145–400)
RBC: 3.95 10*6/uL (ref 3.70–5.45)
RDW: 14.7 % — ABNORMAL HIGH (ref 11.2–14.5)
WBC: 3.3 10*3/uL — ABNORMAL LOW (ref 3.9–10.3)
lymph#: 1.4 10*3/uL (ref 0.9–3.3)

## 2014-04-01 LAB — COMPREHENSIVE METABOLIC PANEL (CC13)
ALBUMIN: 3.3 g/dL — AB (ref 3.5–5.0)
ALT: 8 U/L (ref 0–55)
AST: 17 U/L (ref 5–34)
Alkaline Phosphatase: 63 U/L (ref 40–150)
Anion Gap: 8 mEq/L (ref 3–11)
BUN: 17.5 mg/dL (ref 7.0–26.0)
CALCIUM: 8.5 mg/dL (ref 8.4–10.4)
CHLORIDE: 108 meq/L (ref 98–109)
CO2: 27 meq/L (ref 22–29)
Creatinine: 0.8 mg/dL (ref 0.6–1.1)
EGFR: 85 mL/min/{1.73_m2} — AB (ref >90)
GLUCOSE: 100 mg/dL (ref 70–140)
POTASSIUM: 4.4 meq/L (ref 3.5–5.1)
SODIUM: 143 meq/L (ref 136–145)
TOTAL PROTEIN: 7.3 g/dL (ref 6.4–8.3)
Total Bilirubin: 0.27 mg/dL (ref 0.20–1.20)

## 2014-04-01 LAB — UA PROTEIN, DIPSTICK - CHCC

## 2014-04-01 MED ORDER — SODIUM CHLORIDE 0.9 % IV SOLN
Freq: Once | INTRAVENOUS | Status: AC
  Administered 2014-04-01: 10:00:00 via INTRAVENOUS

## 2014-04-01 MED ORDER — SODIUM CHLORIDE 0.9 % IJ SOLN
10.0000 mL | INTRAMUSCULAR | Status: DC | PRN
  Administered 2014-04-01: 10 mL
  Filled 2014-04-01: qty 10

## 2014-04-01 MED ORDER — SODIUM CHLORIDE 0.9 % IV SOLN
14.4000 mg/kg | Freq: Once | INTRAVENOUS | Status: AC
  Administered 2014-04-01: 1100 mg via INTRAVENOUS
  Filled 2014-04-01: qty 44

## 2014-04-01 MED ORDER — HEPARIN SOD (PORK) LOCK FLUSH 100 UNIT/ML IV SOLN
500.0000 [IU] | Freq: Once | INTRAVENOUS | Status: AC | PRN
  Administered 2014-04-01: 500 [IU]
  Filled 2014-04-01: qty 5

## 2014-04-01 NOTE — Progress Notes (Signed)
Script faxed to dr Evelene Croon dds, stating okay for patient to have teeth cleaned, from an oncologist standpoint. Fax # [phone]

## 2014-04-01 NOTE — Progress Notes (Signed)
Hematology and Oncology Follow Up Visit  [name] [id number] 02/05/1956 59 y.o. 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 This was diagnosed in July of 2014.   Prior Therapy:  She is status post paracentesis performed on 09/17/2012 with the cytology confirmed the presence of adenocarcinoma and immunohistochemical stains suggest GYN etiology. She is also status post lumpectomy for breast cancer diagnosed 25 years ago followed by radiation and chemotherapy under the care of Dr. Sonny Dandy. She did not receive any hormonal therapy. Chemotherapy utilizing carboplatin and Taxotere started on 10/01/2012. Avastin was added with cycle 4. This was discontinued in June of 2015.  Current therapy:  She is currently on a Avastin alone after chemotherapy was discontinued in June of 2015.  Interim History: [name] presents today for a followup visit with her husband. Since her last visit, she continues to do fairly well. She continues to have episodes of diarrhea and is only intermittently using Imodium. .she also reports occasional episodes of nausea. Her current antiemetic, Zofran is adequate to control these episodes. She reports she is due for a dental cleaning on Tuesday and her dentist may also want to replace some old fillings. She is able to eat and maintain her weight without any difficulties. She denied any fever chills.  She has not reported any complications related to a Avastin. She's had no bleeding or bruising. She did not report any exertional dyspnea but reveals that her exercise tolerance have declined.  Her mood continues to stable. Has not reported any shortness of breath or cough. She denied any pain with swallowing.She denied nausea  fever and chills.  She has reported also grade 1 neuropathy predominantly in  her toes.  She has not any shortness of breath or chest pain. She denied any GYN bleeding. She has not reported any recent complications or illnesses. She has not reported any increase in her abdominal pain or early satiety. She also reports no hematochezia or melena. She has not reported any syncope alteration of mental status. Had not report any abdominal distention. Remainder of the review of systems is unremarkable.  Medications: I have reviewed the patient's current medications.  Current Outpatient Prescriptions  Medication Sig Dispense Refill  . ALPRAZolam (XANAX) 1 MG tablet Take 1 mg by mouth at bedtime as needed for anxiety.    . divalproex (DEPAKOTE ER) 500 MG 24 hr tablet Take 1,500 mg by mouth at bedtime.     . gabapentin (NEURONTIN) 100 MG capsule TAKE ONE CAPSULE BY MOUTH THREE TIMES A DAY 90 capsule 1  . lamoTRIgine (LAMICTAL) 200 MG tablet Take 200 mg by mouth at bedtime.  1  . levothyroxine (SYNTHROID, LEVOTHROID) 100 MCG tablet Take 100 mcg by mouth daily before breakfast.     . lidocaine-prilocaine (EMLA) cream Apply topically as needed. Apply to port with every chemotherapy. 30 g 1  . ondansetron (ZOFRAN) 8 MG tablet Take 1 tablet (8 mg total) by mouth every 8 (eight) hours as needed for nausea or vomiting. 20 tablet 1  . rOPINIRole (REQUIP) 1 MG tablet Take 1 mg by mouth at bedtime.    Marland Kitchen OLANZapine-FLUoxetine (SYMBYAX) 6-25 MG per capsule      No current facility-administered medications for this visit.     Allergies: No Known Allergies  Past Medical History, Surgical history, Social history, and Family History were reviewed and  updated.   Physical Exam: Blood pressure 139/75, pulse 75, temperature 97.7 F (36.5 C), temperature source Oral, resp. rate 20, height 5\' 2"  (1.575 m), weight 164 lb (74.39 kg).  ECOG: 1  General appearance: awake alert pleasant woman with appropriate affect.  Head: Normocephalic, without obvious abnormality Neck: no adenopathy Lymph  nodes: Cervical, supraclavicular, and axillary nodes normal. Heart:regular rate and rhythm, S1, S2 normal, no murmur, click, rub or gallop Lung:chest clear, no wheezing, rales, normal symmetric air entry Abdomin: soft, distended and shifting dullness noted.  good bowel sounds without any shifting dullness.  EXT:no erythema, induration, or nodules. She had good pulses and capillary refill. Skin: No rashes or lesions.  Lab Results: Lab Results  Component Value Date   WBC 3.3* 04/01/2014   HGB 11.4* 04/01/2014   HCT 37.1 04/01/2014   MCV 94.0 04/01/2014   PLT 67* 04/01/2014     Chemistry      Component Value Date/Time   NA 143 04/01/2014 0813   NA 136 07/03/2012 1359   K 4.4 04/01/2014 0813   K 4.1 07/03/2012 1359   CL 103 07/03/2012 1359   CO2 27 04/01/2014 0813   CO2 30 07/03/2012 1359   BUN 17.5 04/01/2014 0813   BUN 9 07/03/2012 1359   CREATININE 0.8 04/01/2014 0813   CREATININE 0.7 07/03/2012 1359      Component Value Date/Time   CALCIUM 8.5 04/01/2014 0813   CALCIUM 9.6 07/03/2012 1359   ALKPHOS 63 04/01/2014 0813   ALKPHOS 80 07/03/2012 1359   AST 17 04/01/2014 0813   AST 42* 07/03/2012 1359   ALT 8 04/01/2014 0813   ALT 30 07/03/2012 1359   BILITOT 0.27 04/01/2014 0813   BILITOT 0.5 07/03/2012 1359      Results for Julie, Padilla (MRN [id number]) as of 03/11/2014 13:05  Ref. Range 01/07/2014 11:17 01/28/2014 09:56 02/18/2014 13:10  CA 125 Latest Range: <35 U/mL 53 (H) 48 (H) 52 (H)    Impression and Plan:  59 year old woman with the following issues:  1. Peritoneal carcinomatosis with omental involvement. The pathology confirmed the presence of adenocarcinoma of likely GYN etiology. Her CA 125 was elevated at 333.5 on 09/19/2012. She is status post systemic chemotherapy with an excellent response utilizing carboplatin and Taxotere and a Avastin . This therapy was held in June of 2015 and currently on Avastin maintenance. She continues to tolerate this well  without any signs of progression of disease. Her tumor marker remains stable between 53 and 52. The plan is to repeat imaging studies in March 2016.   2. IV access: She had a Port-A-Cath inserted without any complications.  3. Thrombocytopenia: Her platelets are continued to be improving. No bleeding noted. Her platelet count is 67,000.  4. Genetic counseling: This has been discussed in the past we will continue to evaluate that in the future.  5. Nausea/diarrhea: Both of these entities are intermittent in nature. The instructions for her anti-emetic as well as Imodium were reviewed in detail with the patient and her husband and both voiced understanding of how to use these medications.  6. The followup: She will followup in 3 weeks for her next cycle of a Avastin.    Carlton Adam, PA-C  2/11/20169:27 AM

## 2014-04-01 NOTE — Patient Instructions (Addendum)
[facility] Discharge Instructions for Patients Receiving Chemotherapy  Today you received the following chemotherapy agents: Avastin.  To help prevent nausea and vomiting after your treatment, we encourage you to take your nausea medication: Zofran 8 mg every 8 hours as needed.   If you develop nausea and vomiting that is not controlled by your nausea medication, call the clinic.   BELOW ARE SYMPTOMS THAT SHOULD BE REPORTED IMMEDIATELY:  *FEVER GREATER THAN 100.5 F  *CHILLS WITH OR WITHOUT FEVER  NAUSEA AND VOMITING THAT IS NOT CONTROLLED WITH YOUR NAUSEA MEDICATION  *UNUSUAL SHORTNESS OF BREATH  *UNUSUAL BRUISING OR BLEEDING  TENDERNESS IN MOUTH AND THROAT WITH OR WITHOUT PRESENCE OF ULCERS  *URINARY PROBLEMS  *BOWEL PROBLEMS  UNUSUAL RASH Items with * indicate a potential emergency and should be followed up as soon as possible.  Feel free to call the clinic you have any questions or concerns. The clinic phone number is [phone].

## 2014-04-01 NOTE — Patient Instructions (Signed)
Take your Zofran as prescribed when needed for nausea Take Imodium as instructed when needed for diarrhea Follow-up in 3 weeks

## 2014-04-20 ENCOUNTER — Ambulatory Visit (HOSPITAL_COMMUNITY)
Admission: RE | Admit: 2014-04-20 | Discharge: 2014-04-20 | Disposition: A | Discharge status: Home / Self Care | Payer: BLUE CROSS/BLUE SHIELD | Source: Ambulatory Visit | Attending: Oncology | Admitting: Oncology

## 2014-04-20 ENCOUNTER — Encounter (HOSPITAL_COMMUNITY): Payer: Self-pay

## 2014-04-20 ENCOUNTER — Other Ambulatory Visit (HOSPITAL_BASED_OUTPATIENT_CLINIC_OR_DEPARTMENT_OTHER): Payer: BLUE CROSS/BLUE SHIELD

## 2014-04-20 DIAGNOSIS — C481 Malignant neoplasm of specified parts of peritoneum: Secondary | ICD-10-CM | POA: Insufficient documentation

## 2014-04-20 DIAGNOSIS — C569 Malignant neoplasm of unspecified ovary: Secondary | ICD-10-CM

## 2014-04-20 DIAGNOSIS — C786 Secondary malignant neoplasm of retroperitoneum and peritoneum: Secondary | ICD-10-CM

## 2014-04-20 DIAGNOSIS — C801 Malignant (primary) neoplasm, unspecified: Secondary | ICD-10-CM

## 2014-04-20 LAB — COMPREHENSIVE METABOLIC PANEL (CC13)
ALBUMIN: 3.3 g/dL — AB (ref 3.5–5.0)
ALK PHOS: 62 U/L (ref 40–150)
ALT: 10 U/L (ref 0–55)
AST: 18 U/L (ref 5–34)
Anion Gap: 12 mEq/L — ABNORMAL HIGH (ref 3–11)
BUN: 16 mg/dL (ref 7.0–26.0)
CO2: 24 meq/L (ref 22–29)
CREATININE: 0.8 mg/dL (ref 0.6–1.1)
Calcium: 9.5 mg/dL (ref 8.4–10.4)
Chloride: 106 mEq/L (ref 98–109)
EGFR: 86 mL/min/{1.73_m2} — AB (ref >90)
GLUCOSE: 94 mg/dL (ref 70–140)
Potassium: 4.4 mEq/L (ref 3.5–5.1)
Sodium: 141 mEq/L (ref 136–145)
Total Bilirubin: 0.27 mg/dL (ref 0.20–1.20)
Total Protein: 7.4 g/dL (ref 6.4–8.3)

## 2014-04-20 LAB — CBC WITH DIFFERENTIAL/PLATELET
BASO%: 0.6 % (ref 0.0–2.0)
Basophils Absolute: 0 10*3/uL (ref 0.0–0.1)
EOS ABS: 0.1 10*3/uL (ref 0.0–0.5)
EOS%: 2.8 % (ref 0.0–7.0)
HCT: 37.6 % (ref 34.8–46.6)
HGB: 11.8 g/dL (ref 11.6–15.9)
LYMPH%: 39.8 % (ref 14.0–49.7)
MCH: 29.2 pg (ref 25.1–34.0)
MCHC: 31.2 g/dL — ABNORMAL LOW (ref 31.5–36.0)
MCV: 93.3 fL (ref 79.5–101.0)
MONO#: 0.5 10*3/uL (ref 0.1–0.9)
MONO%: 13.2 % (ref 0.0–14.0)
NEUT#: 1.5 10*3/uL (ref 1.5–6.5)
NEUT%: 43.6 % (ref 38.4–76.8)
Platelets: 77 10*3/uL — ABNORMAL LOW (ref 145–400)
RBC: 4.03 10*6/uL (ref 3.70–5.45)
RDW: 14.7 % — ABNORMAL HIGH (ref 11.2–14.5)
WBC: 3.4 10*3/uL — AB (ref 3.9–10.3)
lymph#: 1.4 10*3/uL (ref 0.9–3.3)

## 2014-04-20 MED ORDER — IOHEXOL 300 MG/ML  SOLN
100.0000 mL | Freq: Once | INTRAMUSCULAR | Status: AC | PRN
  Administered 2014-04-20: 100 mL via INTRAVENOUS

## 2014-04-20 MED ORDER — ONDANSETRON 8 MG/NS 50 ML IVPB
INTRAVENOUS | Status: AC
  Filled 2014-04-20: qty 8

## 2014-04-21 LAB — CA 125: CA 125: 47 U/mL — AB (ref <35)

## 2014-04-22 ENCOUNTER — Ambulatory Visit (HOSPITAL_BASED_OUTPATIENT_CLINIC_OR_DEPARTMENT_OTHER): Payer: BLUE CROSS/BLUE SHIELD

## 2014-04-22 ENCOUNTER — Telehealth: Payer: Self-pay | Admitting: Oncology

## 2014-04-22 ENCOUNTER — Ambulatory Visit (HOSPITAL_BASED_OUTPATIENT_CLINIC_OR_DEPARTMENT_OTHER): Payer: BLUE CROSS/BLUE SHIELD | Admitting: Oncology

## 2014-04-22 VITALS — BP 124/62 | HR 75 | Temp 97.8°F | Resp 17 | Ht 62.0 in | Wt 161.6 lb

## 2014-04-22 DIAGNOSIS — R197 Diarrhea, unspecified: Secondary | ICD-10-CM

## 2014-04-22 DIAGNOSIS — C801 Malignant (primary) neoplasm, unspecified: Secondary | ICD-10-CM

## 2014-04-22 DIAGNOSIS — C786 Secondary malignant neoplasm of retroperitoneum and peritoneum: Secondary | ICD-10-CM

## 2014-04-22 DIAGNOSIS — C569 Malignant neoplasm of unspecified ovary: Secondary | ICD-10-CM

## 2014-04-22 DIAGNOSIS — R110 Nausea: Secondary | ICD-10-CM

## 2014-04-22 DIAGNOSIS — D696 Thrombocytopenia, unspecified: Secondary | ICD-10-CM

## 2014-04-22 DIAGNOSIS — Z5112 Encounter for antineoplastic immunotherapy: Secondary | ICD-10-CM

## 2014-04-22 MED ORDER — SODIUM CHLORIDE 0.9 % IV SOLN
Freq: Once | INTRAVENOUS | Status: AC
  Administered 2014-04-22: 13:00:00 via INTRAVENOUS

## 2014-04-22 MED ORDER — BETAMETHASONE DIPROPIONATE 0.05 % EX CREA: TOPICAL_CREAM | Freq: Two times a day (BID) | CUTANEOUS | Status: DC

## 2014-04-22 MED ORDER — HEPARIN SOD (PORK) LOCK FLUSH 100 UNIT/ML IV SOLN
500.0000 [IU] | Freq: Once | INTRAVENOUS | Status: AC | PRN
  Administered 2014-04-22: 500 [IU]
  Filled 2014-04-22: qty 5

## 2014-04-22 MED ORDER — SODIUM CHLORIDE 0.9 % IV SOLN
14.5000 mg/kg | Freq: Once | INTRAVENOUS | Status: AC
  Administered 2014-04-22: 1100 mg via INTRAVENOUS
  Filled 2014-04-22: qty 44

## 2014-04-22 MED ORDER — SODIUM CHLORIDE 0.9 % IJ SOLN
10.0000 mL | INTRAMUSCULAR | Status: DC | PRN
  Administered 2014-04-22: 10 mL
  Filled 2014-04-22: qty 10

## 2014-04-22 NOTE — Progress Notes (Signed)
Hematology and Oncology Follow Up Visit  [name] [id number] 1955/05/20 59 y.o. 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 This was diagnosed in July of 2014.   Prior Therapy:  She is status post paracentesis performed on 09/17/2012 with the cytology confirmed the presence of adenocarcinoma and immunohistochemical stains suggest GYN etiology. She is also status post lumpectomy for breast cancer diagnosed 25 years ago followed by radiation and chemotherapy under the care of Dr. Sonny Dandy. She did not receive any hormonal therapy. Chemotherapy utilizing carboplatin and Taxotere started on 10/01/2012. Avastin was added with cycle 4. This was discontinued in June of 2015.  Current therapy:  She is currently on a Avastin alone after chemotherapy was discontinued in June of 2015.  Interim History: [name] presents today for a followup visit with her husband. Since her last visit, she first no new issues. She does report slight inguinal area rash that she has had before. She had been diagnosed with a fungal infection and had been prescribed a cream in the past. She reports occasional episodes of nausea. Her current antiemetic, Zofran is adequate to control these episodes. Her appetite had been reasonable and she is able to eat and maintain her weight without any difficulties. She denied any fever chills. She has not reported any complications related to a Avastin. She's had no bleeding or bruising. She did not report any exertional dyspnea. Her mood continues to stable. Has not reported any shortness of breath or cough. She denied any pain with swallowing.She denied nausea  fever and chills.  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  She has not any shortness of breath or chest pain. She denied any  GYN bleeding. She has not reported any recent complications or illnesses. She has not reported any increase in her abdominal pain or early satiety. She also reports no hematochezia or melena. She has not reported any syncope alteration of mental status. Had not report any abdominal distention. Remainder of the review of systems is unremarkable.  Medications: I have reviewed the patient's current medications.  Current Outpatient Prescriptions  Medication Sig Dispense Refill  . ALPRAZolam (XANAX) 1 MG tablet Take 1 mg by mouth at bedtime as needed for anxiety.    . divalproex (DEPAKOTE ER) 500 MG 24 hr tablet Take 1,500 mg by mouth at bedtime.     . gabapentin (NEURONTIN) 100 MG capsule TAKE ONE CAPSULE BY MOUTH THREE TIMES A DAY 90 capsule 1  . lamoTRIgine (LAMICTAL) 200 MG tablet Take 200 mg by mouth at bedtime.  1  . levothyroxine (SYNTHROID, LEVOTHROID) 100 MCG tablet Take 100 mcg by mouth daily before breakfast.     . lidocaine-prilocaine (EMLA) cream Apply topically as needed. Apply to port with every chemotherapy. 30 g 1  . ondansetron (ZOFRAN) 8 MG tablet Take 1 tablet (8 mg total) by mouth every 8 (eight) hours as needed for nausea or vomiting. 20 tablet 1  . rOPINIRole (REQUIP) 1 MG tablet Take 1 mg by mouth at bedtime.    . betamethasone dipropionate (DIPROLENE) 0.05 % cream Apply topically 2 (two) times daily. 30 g 0  . OLANZapine-FLUoxetine (SYMBYAX) 6-25 MG per capsule      No current facility-administered medications for this visit.     Allergies: No Known Allergies  Past Medical History, Surgical history, Social history, and  Family History were reviewed and updated.   Physical Exam: Blood pressure 124/62, pulse 75, temperature 97.8 F (36.6 C), temperature source Oral, resp. rate 17, height 5\' 2"  (1.575 m), weight 161 lb 9.6 oz (73.301 kg), SpO2 97 %.  ECOG: 1  General appearance: awake alert pleasant woman with appropriate affect.  Head: Normocephalic, without obvious  abnormality Neck: no adenopathy Lymph nodes: Cervical, supraclavicular, and axillary nodes normal. Heart:regular rate and rhythm, S1, S2 normal, no murmur, click, rub or gallop Lung:chest clear, no wheezing, rales, normal symmetric air entry Abdomin: soft, distended and shifting dullness noted.  good bowel sounds without any shifting dullness.  EXT:no erythema, induration, or nodules. She had good pulses and capillary refill. Skin: Rash noted in the inguinal and inner thigh area.  Lab Results: Lab Results  Component Value Date   WBC 3.4* 04/20/2014   HGB 11.8 04/20/2014   HCT 37.6 04/20/2014   MCV 93.3 04/20/2014   PLT 77* 04/20/2014     Chemistry      Component Value Date/Time   NA 141 04/20/2014 0858   NA 136 07/03/2012 1359   K 4.4 04/20/2014 0858   K 4.1 07/03/2012 1359   CL 103 07/03/2012 1359   CO2 24 04/20/2014 0858   CO2 30 07/03/2012 1359   BUN 16.0 04/20/2014 0858   BUN 9 07/03/2012 1359   CREATININE 0.8 04/20/2014 0858   CREATININE 0.7 07/03/2012 1359      Component Value Date/Time   CALCIUM 9.5 04/20/2014 0858   CALCIUM 9.6 07/03/2012 1359   ALKPHOS 62 04/20/2014 0858   ALKPHOS 80 07/03/2012 1359   AST 18 04/20/2014 0858   AST 42* 07/03/2012 1359   ALT 10 04/20/2014 0858   ALT 30 07/03/2012 1359   BILITOT 0.27 04/20/2014 0858   BILITOT 0.5 07/03/2012 1359     Results for [name], [name] (MRN [id number]) as of 04/22/2014 12:02  Ref. Range 02/18/2014 13:10 03/11/2014 12:48 04/20/2014 08:57  CA 125 Latest Range: <35 U/mL 52 (H) 55 (H) 47 (H)    EXAM: CT CHEST, ABDOMEN, AND PELVIS WITH CONTRAST  TECHNIQUE: Multidetector CT imaging of the chest, abdomen and pelvis was performed following the standard protocol during bolus administration of intravenous contrast.  CONTRAST: 148mL OMNIPAQUE IOHEXOL 300 MG/ML SOLN  COMPARISON: 12/15/2013  FINDINGS: CT CHEST FINDINGS  Right Port-A-Cath remains in place, unchanged. Heart is normal size. Aorta is  normal caliber. No mediastinal, hilar, or axillary adenopathy.  Surgical clips in the right axilla and changes of right mastectomy. No soft tissue abnormality within the chest wall.  Lungs are clear. No focal airspace opacities or suspicious nodules. No effusions.  No acute bony abnormality or focal bone lesion. Degenerative changes in the thoracic spine.  CT ABDOMEN AND PELVIS FINDINGS  Mildly nodular contour of the left hepatic lobe again noted, stable, nonspecific. Small amount of perihepatic ascites anterior to the liver, stable. No focal hepatic abnormality. Gallbladder, spleen, pancreas, adrenals and kidneys are unremarkable.  Stomach, large and small bowel are unremarkable. Uterus, adnexae and urinary bladder are unremarkable.  Extensive omental nodularity. Left anterior omental thickening on image 70 measures 9.8 x 2.4 cm, stable. Right lateral omental nodularity measures 7.6 x 2.3 cm on image 69, stable. Extensive right anterior nodularity (image 64 is stable. Stable trace free fluid in the pelvis.  No retroperitoneal or mesenteric adenopathy. Aorta is normal caliber.  No acute bony abnormality or focal bone lesion.  IMPRESSION: Omental disease is stable since prior study. Stable  trace pelvic free fluid.  No evidence of metastatic disease in the chest.   Impression and Plan:  59 year old woman with the following issues:  1. Peritoneal carcinomatosis with omental involvement. The pathology confirmed the presence of adenocarcinoma of likely GYN etiology. Her CA 125 was elevated at 333.5 on 09/19/2012. She is status post systemic chemotherapy with an excellent response utilizing carboplatin and Taxotere and a Avastin . This therapy was held in June of 2015 and currently on Avastin maintenance.   CT scan obtained on 04/20/2014 was reviewed today and continue to show stable disease. Her tumor marker continues to be relatively stable and slightly  declining. She has tolerated this therapy very well without any complications. The plan is to continue the current dose and regimen.  2. IV access: She had a Port-A-Cath inserted without any complications.  3. Thrombocytopenia: Her platelets are continued to be improving. No bleeding noted. Her platelet count is 67,000.  4. Rash in the right inguinal adenopathy I recall in prescription for steroid cream prescribed for her per her request.  5. Nausea/diarrhea: Seems to be manageable at this time.  6. The followup: She will followup in 3 weeks for her next cycle of a Avastin.    [facility], MD 3/3/201612:21 PM

## 2014-04-22 NOTE — Patient Instructions (Signed)
[facility] Discharge Instructions for Patients Receiving Chemotherapy  Today you received the following chemotherapy agents Avastin.  To help prevent nausea and vomiting after your treatment, we encourage you to take your nausea medication.   If you develop nausea and vomiting that is not controlled by your nausea medication, call the clinic.   BELOW ARE SYMPTOMS THAT SHOULD BE REPORTED IMMEDIATELY:  *FEVER GREATER THAN 100.5 F  *CHILLS WITH OR WITHOUT FEVER  NAUSEA AND VOMITING THAT IS NOT CONTROLLED WITH YOUR NAUSEA MEDICATION  *UNUSUAL SHORTNESS OF BREATH  *UNUSUAL BRUISING OR BLEEDING  TENDERNESS IN MOUTH AND THROAT WITH OR WITHOUT PRESENCE OF ULCERS  *URINARY PROBLEMS  *BOWEL PROBLEMS  UNUSUAL RASH Items with * indicate a potential emergency and should be followed up as soon as possible.  Feel free to call the clinic you have any questions or concerns. The clinic phone number is [phone].    

## 2014-04-22 NOTE — Progress Notes (Signed)
Vital signs pre and post Avastin stable; patient discharge home with husband and no complaints

## 2014-04-22 NOTE — Telephone Encounter (Signed)
gv adn printed appt sched adn avs for pt for March and April

## 2014-04-23 ENCOUNTER — Other Ambulatory Visit: Payer: Self-pay

## 2014-04-23 DIAGNOSIS — Z1231 Encounter for screening mammogram for malignant neoplasm of breast: Secondary | ICD-10-CM

## 2014-04-28 ENCOUNTER — Ambulatory Visit: Payer: BLUE CROSS/BLUE SHIELD

## 2014-05-11 ENCOUNTER — Ambulatory Visit
Admission: RE | Admit: 2014-05-11 | Discharge: 2014-05-11 | Disposition: A | Discharge status: Home / Self Care | Payer: BLUE CROSS/BLUE SHIELD | Source: Ambulatory Visit

## 2014-05-11 DIAGNOSIS — Z1231 Encounter for screening mammogram for malignant neoplasm of breast: Secondary | ICD-10-CM

## 2014-05-12 ENCOUNTER — Other Ambulatory Visit: Payer: Self-pay | Admitting: Physician Assistant

## 2014-05-13 ENCOUNTER — Other Ambulatory Visit (HOSPITAL_BASED_OUTPATIENT_CLINIC_OR_DEPARTMENT_OTHER): Payer: BLUE CROSS/BLUE SHIELD

## 2014-05-13 ENCOUNTER — Encounter: Payer: Self-pay | Admitting: Physician Assistant

## 2014-05-13 ENCOUNTER — Ambulatory Visit (HOSPITAL_BASED_OUTPATIENT_CLINIC_OR_DEPARTMENT_OTHER): Payer: BLUE CROSS/BLUE SHIELD

## 2014-05-13 ENCOUNTER — Ambulatory Visit (HOSPITAL_BASED_OUTPATIENT_CLINIC_OR_DEPARTMENT_OTHER): Payer: BLUE CROSS/BLUE SHIELD | Admitting: Physician Assistant

## 2014-05-13 VITALS — BP 139/76 | HR 72 | Temp 98.3°F | Resp 18 | Ht 62.0 in | Wt 164.8 lb

## 2014-05-13 DIAGNOSIS — C569 Malignant neoplasm of unspecified ovary: Secondary | ICD-10-CM | POA: Diagnosis not present

## 2014-05-13 DIAGNOSIS — Z5112 Encounter for antineoplastic immunotherapy: Secondary | ICD-10-CM

## 2014-05-13 DIAGNOSIS — C786 Secondary malignant neoplasm of retroperitoneum and peritoneum: Secondary | ICD-10-CM

## 2014-05-13 DIAGNOSIS — D696 Thrombocytopenia, unspecified: Secondary | ICD-10-CM

## 2014-05-13 DIAGNOSIS — C801 Malignant (primary) neoplasm, unspecified: Secondary | ICD-10-CM

## 2014-05-13 DIAGNOSIS — R21 Rash and other nonspecific skin eruption: Secondary | ICD-10-CM

## 2014-05-13 LAB — CBC WITH DIFFERENTIAL/PLATELET
BASO%: 1 % (ref 0.0–2.0)
Basophils Absolute: 0 10*3/uL (ref 0.0–0.1)
EOS ABS: 0.1 10*3/uL (ref 0.0–0.5)
EOS%: 4.2 % (ref 0.0–7.0)
HEMATOCRIT: 37 % (ref 34.8–46.6)
HGB: 11.5 g/dL — ABNORMAL LOW (ref 11.6–15.9)
LYMPH%: 40.9 % (ref 14.0–49.7)
MCH: 29.2 pg (ref 25.1–34.0)
MCHC: 31 g/dL — ABNORMAL LOW (ref 31.5–36.0)
MCV: 94.1 fL (ref 79.5–101.0)
MONO#: 0.4 10*3/uL (ref 0.1–0.9)
MONO%: 10.7 % (ref 0.0–14.0)
NEUT%: 43.2 % (ref 38.4–76.8)
NEUTROS ABS: 1.5 10*3/uL (ref 1.5–6.5)
Platelets: 71 10*3/uL — ABNORMAL LOW (ref 145–400)
RBC: 3.94 10*6/uL (ref 3.70–5.45)
RDW: 15 % — ABNORMAL HIGH (ref 11.2–14.5)
WBC: 3.5 10*3/uL — AB (ref 3.9–10.3)
lymph#: 1.4 10*3/uL (ref 0.9–3.3)

## 2014-05-13 LAB — COMPREHENSIVE METABOLIC PANEL (CC13)
ALBUMIN: 3.2 g/dL — AB (ref 3.5–5.0)
ALK PHOS: 61 U/L (ref 40–150)
ALT: 10 U/L (ref 0–55)
AST: 16 U/L (ref 5–34)
Anion Gap: 11 mEq/L (ref 3–11)
BILIRUBIN TOTAL: 0.27 mg/dL (ref 0.20–1.20)
BUN: 15.1 mg/dL (ref 7.0–26.0)
CO2: 24 mEq/L (ref 22–29)
Calcium: 8.8 mg/dL (ref 8.4–10.4)
Chloride: 106 mEq/L (ref 98–109)
Creatinine: 0.7 mg/dL (ref 0.6–1.1)
EGFR: >90 mL/min/{1.73_m2} (ref >90)
Glucose: 99 mg/dl (ref 70–140)
Potassium: 4.1 mEq/L (ref 3.5–5.1)
SODIUM: 141 meq/L (ref 136–145)
TOTAL PROTEIN: 7.3 g/dL (ref 6.4–8.3)

## 2014-05-13 MED ORDER — SODIUM CHLORIDE 0.9 % IJ SOLN
10.0000 mL | INTRAMUSCULAR | Status: DC | PRN
  Administered 2014-05-13: 10 mL
  Filled 2014-05-13: qty 10

## 2014-05-13 MED ORDER — SODIUM CHLORIDE 0.9 % IV SOLN
14.5000 mg/kg | Freq: Once | INTRAVENOUS | Status: AC
  Administered 2014-05-13: 1100 mg via INTRAVENOUS
  Filled 2014-05-13: qty 44

## 2014-05-13 MED ORDER — HEPARIN SOD (PORK) LOCK FLUSH 100 UNIT/ML IV SOLN
500.0000 [IU] | Freq: Once | INTRAVENOUS | Status: AC | PRN
  Administered 2014-05-13: 500 [IU]
  Filled 2014-05-13: qty 5

## 2014-05-13 MED ORDER — SODIUM CHLORIDE 0.9 % IV SOLN
Freq: Once | INTRAVENOUS | Status: AC
  Administered 2014-05-13: 11:00:00 via INTRAVENOUS

## 2014-05-13 NOTE — Progress Notes (Signed)
Hematology and Oncology Follow Up Visit  [name] Padilla [id number] [birth year] 59 y.o. 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 This was diagnosed in July of 2014.   Prior Therapy:  She is status post paracentesis performed on 09/17/2012 with the cytology confirmed the presence of adenocarcinoma and immunohistochemical stains suggest GYN etiology. She is also status post lumpectomy for breast cancer diagnosed 25 years ago followed by radiation and chemotherapy under the care of Dr. Sonny Padilla. She did not receive any hormonal therapy. Chemotherapy utilizing carboplatin and Taxotere started on 10/01/2012. Avastin was added with cycle 4. This was discontinued in June of 2015.  Current therapy:  She is currently on a Avastin alone after chemotherapy was discontinued in June of 2015.  Interim History: [name] Padilla presents today for a followup visit with her husband. Since her last visit, she reports no new issues. She reports occasional episodes of nausea. Her current antiemetic, Zofran is adequate to control these episodes. Her appetite had been reasonable and she is able to eat and maintain her weight without any difficulties. She denied any fever chills. She has not reported any complications related to a Avastin. She's had no bleeding or bruising. She does not report any exertional dyspnea. Her mood continues to stable. Has not reported any shortness of breath or cough. She denied any pain with swallowing.She denied nausea  fever and chills.  She has reported also grade 1 neuropathy predominantly in her toes, which remains stable.  She has not any shortness of breath or chest pain. She denied any GYN bleeding. She has not reported any recent complications or illnesses. She has not reported any increase in her abdominal pain or  early satiety. She also reports no hematochezia or melena. She has not reported any syncope alteration of mental status. Does not report any abdominal distention. Remainder of the review of systems is unremarkable.  Medications: I have reviewed the patient's current medications.  Current Outpatient Prescriptions  Medication Sig Dispense Refill  . ALPRAZolam (XANAX) 1 MG tablet Take 1 mg by mouth at bedtime as needed for anxiety.    . divalproex (DEPAKOTE ER) 500 MG 24 hr tablet Take 1,500 mg by mouth at bedtime.     . gabapentin (NEURONTIN) 100 MG capsule TAKE ONE CAPSULE BY MOUTH THREE TIMES A DAY 90 capsule 1  . lamoTRIgine (LAMICTAL) 200 MG tablet Take 200 mg by mouth at bedtime.  1  . levothyroxine (SYNTHROID, LEVOTHROID) 100 MCG tablet Take 100 mcg by mouth daily before breakfast.     . lidocaine-prilocaine (EMLA) cream Apply topically as needed. Apply to port with every chemotherapy. 30 g 1  . OLANZapine-FLUoxetine (SYMBYAX) 6-25 MG per capsule     . ondansetron (ZOFRAN) 8 MG tablet Take 1 tablet (8 mg total) by mouth every 8 (eight) hours as needed for nausea or vomiting. 20 tablet 1  . rOPINIRole (REQUIP) 1 MG tablet Take 1 mg by mouth at bedtime.    . betamethasone dipropionate (DIPROLENE) 0.05 % cream Apply topically 2 (two) times daily. (Patient not taking: Reported on 05/13/2014) 30 g 0   No current facility-administered medications for this visit.     Allergies: No Known Allergies  Past Medical History, Surgical history, Social history, and Family History were reviewed and updated.   Physical Exam: Blood pressure 139/76, pulse 72, temperature 98.3 F (36.8 C),  temperature source Oral, resp. rate 18, height 5\' 2"  (1.575 m), weight 164 lb 12.8 oz (74.753 kg), SpO2 100 %.  ECOG: 1  General appearance: awake alert pleasant woman with appropriate affect.  Head: Normocephalic, without obvious abnormality Neck: no adenopathy Lymph nodes: Cervical, supraclavicular, and axillary  nodes normal. Heart:regular rate and rhythm, S1, S2 normal, no murmur, click, rub or gallop Lung:chest clear, no wheezing, rales, normal symmetric air entry Abdomin: soft, distended and shifting dullness noted.  good bowel sounds without any shifting dullness.  EXT:no erythema, induration, or nodules. She had good pulses and capillary refill. Skin: Rash noted in the inguinal and inner thigh area.  Lab Results: Lab Results  Component Value Date   WBC 3.5* 05/13/2014   HGB 11.5* 05/13/2014   HCT 37.0 05/13/2014   MCV 94.1 05/13/2014   PLT 71* 05/13/2014     Chemistry      Component Value Date/Time   NA 141 05/13/2014 0956   NA 136 07/03/2012 1359   K 4.1 05/13/2014 0956   K 4.1 07/03/2012 1359   CL 103 07/03/2012 1359   CO2 24 05/13/2014 0956   CO2 30 07/03/2012 1359   BUN 15.1 05/13/2014 0956   BUN 9 07/03/2012 1359   CREATININE 0.7 05/13/2014 0956   CREATININE 0.7 07/03/2012 1359      Component Value Date/Time   CALCIUM 8.8 05/13/2014 0956   CALCIUM 9.6 07/03/2012 1359   ALKPHOS 61 05/13/2014 0956   ALKPHOS 80 07/03/2012 1359   AST 16 05/13/2014 0956   AST 42* 07/03/2012 1359   ALT 10 05/13/2014 0956   ALT 30 07/03/2012 1359   BILITOT 0.27 05/13/2014 0956   BILITOT 0.5 07/03/2012 1359     Results for [name] Padilla, [name] Padilla (MRN [id number]) as of 04/22/2014 12:02  Ref. Range 02/18/2014 13:10 03/11/2014 12:48 04/20/2014 08:57  CA 125 Latest Range: <35 U/mL 52 (H) 55 (H) 47 (H)    EXAM: CT CHEST, ABDOMEN, AND PELVIS WITH CONTRAST  TECHNIQUE: Multidetector CT imaging of the chest, abdomen and pelvis was performed following the standard protocol during bolus administration of intravenous contrast.  CONTRAST: 181mL OMNIPAQUE IOHEXOL 300 MG/ML SOLN  COMPARISON: 12/15/2013  FINDINGS: CT CHEST FINDINGS  Right Port-A-Cath remains in place, unchanged. Heart is normal size. Aorta is normal caliber. No mediastinal, hilar, or axillary adenopathy.  Surgical clips in  the right axilla and changes of right mastectomy. No soft tissue abnormality within the chest wall.  Lungs are clear. No focal airspace opacities or suspicious nodules. No effusions.  No acute bony abnormality or focal bone lesion. Degenerative changes in the thoracic spine.  CT ABDOMEN AND PELVIS FINDINGS  Mildly nodular contour of the left hepatic lobe again noted, stable, nonspecific. Small amount of perihepatic ascites anterior to the liver, stable. No focal hepatic abnormality. Gallbladder, spleen, pancreas, adrenals and kidneys are unremarkable.  Stomach, large and small bowel are unremarkable. Uterus, adnexae and urinary bladder are unremarkable.  Extensive omental nodularity. Left anterior omental thickening on image 70 measures 9.8 x 2.4 cm, stable. Right lateral omental nodularity measures 7.6 x 2.3 cm on image 69, stable. Extensive right anterior nodularity (image 64 is stable. Stable trace free fluid in the pelvis.  No retroperitoneal or mesenteric adenopathy. Aorta is normal caliber.  No acute bony abnormality or focal bone lesion.  IMPRESSION: Omental disease is stable since prior study. Stable trace pelvic free fluid.  No evidence of metastatic disease in the chest.   Impression and Plan:  59 year old  woman with the following issues:  1. Peritoneal carcinomatosis with omental involvement. The pathology confirmed the presence of adenocarcinoma of likely GYN etiology. Her CA 125 was elevated at 333.5 on 09/19/2012. She is status post systemic chemotherapy with an excellent response utilizing carboplatin and Taxotere and a Avastin . This therapy was held in June of 2015 and currently on Avastin maintenance.   CT scan obtained on 04/20/2014 was reviewed today and continue to show stable disease. Her tumor marker continues to be relatively stable and slightly declining. She has tolerated this therapy very well without any complications. The plan is to  continue the current dose and regimen.  2. IV access: She had a Port-A-Cath inserted without any complications.  3. Thrombocytopenia: Her platelets continue to improve. No bleeding noted. Her platelet count is 71,000.  4. Rash in the right inguinal adenopathy I recall in prescription for steroid cream prescribed for her per her request.  5. Nausea/diarrhea: Seems to be manageable at this time.  6. The followup: She will followup in 3 weeks for her next cycle of a Avastin.    Carlton Adam, [place]  3/24/20163:36 PM

## 2014-05-13 NOTE — Patient Instructions (Signed)
[facility] Discharge Instructions for Patients Receiving Chemotherapy  Today you received the following chemotherapy agents: Avastin  To help prevent nausea and vomiting after your treatment, we encourage you to take your nausea medication. If you develop nausea and vomiting that is not controlled by your nausea medication, call the clinic.   BELOW ARE SYMPTOMS THAT SHOULD BE REPORTED IMMEDIATELY:  *FEVER GREATER THAN 100.5 F  *CHILLS WITH OR WITHOUT FEVER  NAUSEA AND VOMITING THAT IS NOT CONTROLLED WITH YOUR NAUSEA MEDICATION  *UNUSUAL SHORTNESS OF BREATH  *UNUSUAL BRUISING OR BLEEDING  TENDERNESS IN MOUTH AND THROAT WITH OR WITHOUT PRESENCE OF ULCERS  *URINARY PROBLEMS  *BOWEL PROBLEMS  UNUSUAL RASH Items with * indicate a potential emergency and should be followed up as soon as possible.  Feel free to call the clinic you have any questions or concerns. The clinic phone number is [phone].  Please show the CHEMO ALERT CARD at check-in to the Emergency Department and triage nurse.   

## 2014-05-13 NOTE — Progress Notes (Signed)
Vital signs post Avastin stable. Patient discharged with no complaints.

## 2014-05-14 LAB — CA 125: CA 125: 51 U/mL — AB (ref <35)

## 2014-05-16 NOTE — Patient Instructions (Signed)
Follow-up in 3 weeks

## 2014-06-03 ENCOUNTER — Other Ambulatory Visit (HOSPITAL_COMMUNITY)
Admission: AD | Admit: 2014-06-03 | Discharge: 2014-06-03 | Disposition: A | Discharge status: Home / Self Care | Payer: BLUE CROSS/BLUE SHIELD | Source: Ambulatory Visit | Attending: Oncology | Admitting: Oncology

## 2014-06-03 ENCOUNTER — Ambulatory Visit (HOSPITAL_BASED_OUTPATIENT_CLINIC_OR_DEPARTMENT_OTHER): Payer: BLUE CROSS/BLUE SHIELD | Admitting: Oncology

## 2014-06-03 ENCOUNTER — Other Ambulatory Visit (HOSPITAL_BASED_OUTPATIENT_CLINIC_OR_DEPARTMENT_OTHER): Payer: BLUE CROSS/BLUE SHIELD

## 2014-06-03 ENCOUNTER — Ambulatory Visit (HOSPITAL_BASED_OUTPATIENT_CLINIC_OR_DEPARTMENT_OTHER): Payer: BLUE CROSS/BLUE SHIELD

## 2014-06-03 ENCOUNTER — Telehealth: Payer: Self-pay | Admitting: Oncology

## 2014-06-03 VITALS — BP 148/86 | HR 77 | Temp 97.8°F | Resp 17 | Ht 62.0 in | Wt 169.0 lb

## 2014-06-03 VITALS — BP 139/79 | HR 75 | Resp 18

## 2014-06-03 DIAGNOSIS — C786 Secondary malignant neoplasm of retroperitoneum and peritoneum: Secondary | ICD-10-CM | POA: Diagnosis not present

## 2014-06-03 DIAGNOSIS — R180 Malignant ascites: Secondary | ICD-10-CM

## 2014-06-03 DIAGNOSIS — M79641 Pain in right hand: Secondary | ICD-10-CM

## 2014-06-03 DIAGNOSIS — C569 Malignant neoplasm of unspecified ovary: Secondary | ICD-10-CM

## 2014-06-03 DIAGNOSIS — M79642 Pain in left hand: Secondary | ICD-10-CM

## 2014-06-03 DIAGNOSIS — D696 Thrombocytopenia, unspecified: Secondary | ICD-10-CM

## 2014-06-03 DIAGNOSIS — C482 Malignant neoplasm of peritoneum, unspecified: Secondary | ICD-10-CM

## 2014-06-03 DIAGNOSIS — C801 Malignant (primary) neoplasm, unspecified: Secondary | ICD-10-CM | POA: Diagnosis not present

## 2014-06-03 DIAGNOSIS — C50919 Malignant neoplasm of unspecified site of unspecified female breast: Secondary | ICD-10-CM | POA: Diagnosis present

## 2014-06-03 DIAGNOSIS — Z5112 Encounter for antineoplastic immunotherapy: Secondary | ICD-10-CM

## 2014-06-03 LAB — CBC WITH DIFFERENTIAL/PLATELET
BASO%: 0.7 % (ref 0.0–2.0)
Basophils Absolute: 0 10*3/uL (ref 0.0–0.1)
EOS ABS: 0.1 10*3/uL (ref 0.0–0.5)
EOS%: 4.3 % (ref 0.0–7.0)
HCT: 36.4 % (ref 34.8–46.6)
HGB: 11.3 g/dL — ABNORMAL LOW (ref 11.6–15.9)
LYMPH%: 39.5 % (ref 14.0–49.7)
MCH: 29.2 pg (ref 25.1–34.0)
MCHC: 31 g/dL — AB (ref 31.5–36.0)
MCV: 94 fL (ref 79.5–101.0)
MONO#: 0.3 10*3/uL (ref 0.1–0.9)
MONO%: 10.2 % (ref 0.0–14.0)
NEUT#: 1.3 10*3/uL — ABNORMAL LOW (ref 1.5–6.5)
NEUT%: 45.3 % (ref 38.4–76.8)
PLATELETS: 79 10*3/uL — AB (ref 145–400)
RBC: 3.87 10*6/uL (ref 3.70–5.45)
RDW: 15.2 % — AB (ref 11.2–14.5)
WBC: 2.9 10*3/uL — AB (ref 3.9–10.3)
lymph#: 1.2 10*3/uL (ref 0.9–3.3)

## 2014-06-03 LAB — COMPREHENSIVE METABOLIC PANEL
ALT: 13 U/L (ref 0–35)
AST: 21 U/L (ref 0–37)
Albumin: 3.5 g/dL (ref 3.5–5.2)
Alkaline Phosphatase: 57 U/L (ref 39–117)
Anion gap: 7 (ref 5–15)
BUN: 15 mg/dL (ref 6–23)
CO2: 29 mmol/L (ref 19–32)
Calcium: 8.8 mg/dL (ref 8.4–10.5)
Chloride: 107 mmol/L (ref 96–112)
Creatinine, Ser: 0.74 mg/dL (ref 0.50–1.10)
GFR calc non Af Amer: >90 mL/min (ref >90)
Glucose, Bld: 106 mg/dL — ABNORMAL HIGH (ref 70–99)
POTASSIUM: 4.3 mmol/L (ref 3.5–5.1)
Sodium: 143 mmol/L (ref 135–145)
TOTAL PROTEIN: 7.3 g/dL (ref 6.0–8.3)
Total Bilirubin: 0.2 mg/dL — ABNORMAL LOW (ref 0.3–1.2)

## 2014-06-03 LAB — UA PROTEIN, DIPSTICK - CHCC: Protein, ur: <30 mg/dL

## 2014-06-03 MED ORDER — HEPARIN SOD (PORK) LOCK FLUSH 100 UNIT/ML IV SOLN
500.0000 [IU] | Freq: Once | INTRAVENOUS | Status: AC | PRN
  Administered 2014-06-03: 500 [IU]
  Filled 2014-06-03: qty 5

## 2014-06-03 MED ORDER — SODIUM CHLORIDE 0.9 % IJ SOLN
10.0000 mL | INTRAMUSCULAR | Status: DC | PRN
  Administered 2014-06-03: 10 mL
  Filled 2014-06-03: qty 10

## 2014-06-03 MED ORDER — SODIUM CHLORIDE 0.9 % IV SOLN
Freq: Once | INTRAVENOUS | Status: AC
  Administered 2014-06-03: 13:00:00 via INTRAVENOUS

## 2014-06-03 MED ORDER — BEVACIZUMAB CHEMO INJECTION 400 MG/16ML
14.5000 mg/kg | Freq: Once | INTRAVENOUS | Status: AC
  Administered 2014-06-03: 1100 mg via INTRAVENOUS
  Filled 2014-06-03: qty 44

## 2014-06-03 NOTE — Progress Notes (Signed)
Hematology and Oncology Follow Up Visit  [name] [id number] 03-Nov-1955 59 y.o. 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 This was diagnosed in July of 2014.   Prior Therapy:  She is status post paracentesis performed on 09/17/2012 with the cytology confirmed the presence of adenocarcinoma and immunohistochemical stains suggest GYN etiology. She is also status post lumpectomy for breast cancer diagnosed 25 years ago followed by radiation and chemotherapy under the care of Dr. Sonny Dandy. She did not receive any hormonal therapy. Chemotherapy utilizing carboplatin and Taxotere started on 10/01/2012. Avastin was added with cycle 4. This was discontinued in June of 2015.  Current therapy:  She is currently on a Avastin alone after chemotherapy was discontinued in June of 2015.  Interim History: [name] presents today for a followup visit with her husband. Since her last visit, she continues to do well and overall improved. She has tolerated Avastin without any new complications. She did report one episode of epistaxis likely related to dry mucosa. She reports occasional episodes of nausea. Her current antiemetic, Zofran is adequate to control these episodes. Her appetite is excellent and have gained weight. She denied any fever chills. She has reported some pain and her thumbs bilaterally but not interfering with her function.  Her mood continues to stable. Has not reported any shortness of breath or cough. She denied any pain with swallowing.She denied fever and chills.  She has reported also grade 1 neuropathy predominantly in her toes, which remains stable.  She has not any shortness of breath or chest pain. She denied any GYN bleeding. She has not reported any increase in her abdominal pain or early satiety. She also reports no hematochezia or melena. She has  not reported any syncope alteration of mental status. Does not report any abdominal distention. Remainder of the review of systems is unremarkable.  Medications: I have reviewed the patient's current medications.  Current Outpatient Prescriptions  Medication Sig Dispense Refill  . ALPRAZolam (XANAX) 1 MG tablet Take 1 mg by mouth at bedtime as needed for anxiety.    . betamethasone dipropionate (DIPROLENE) 0.05 % cream Apply topically 2 (two) times daily. 30 g 0  . divalproex (DEPAKOTE ER) 500 MG 24 hr tablet Take 1,500 mg by mouth at bedtime.     . gabapentin (NEURONTIN) 100 MG capsule TAKE ONE CAPSULE BY MOUTH THREE TIMES A DAY 90 capsule 1  . lamoTRIgine (LAMICTAL) 200 MG tablet Take 200 mg by mouth at bedtime.  1  . levothyroxine (SYNTHROID, LEVOTHROID) 100 MCG tablet Take 100 mcg by mouth daily before breakfast.     . lidocaine-prilocaine (EMLA) cream Apply topically as needed. Apply to port with every chemotherapy. 30 g 1  . OLANZapine-FLUoxetine (SYMBYAX) 6-25 MG per capsule     . ondansetron (ZOFRAN) 8 MG tablet Take 1 tablet (8 mg total) by mouth every 8 (eight) hours as needed for nausea or vomiting. 20 tablet 1  . rOPINIRole (REQUIP) 1 MG tablet Take 1 mg by mouth at bedtime.     No current facility-administered medications for this visit.     Allergies: No Known Allergies  Past Medical History, Surgical history, Social history, and Family History were reviewed and updated.   Physical Exam: Blood pressure 148/86, pulse 77, temperature 97.8 F (36.6 C), temperature source Oral, resp. rate 17, height 5\' 2"  (1.575 m), weight 169 lb (  76.658 kg), SpO2 100 %.  ECOG: 1  General appearance: awake alert pleasant woman with appropriate mood. Head: Normocephalic, without obvious abnormality Neck: no adenopathy Lymph nodes: Cervical, supraclavicular, and axillary nodes normal. Heart:regular rate and rhythm, S1, S2 normal, no murmur, click, rub or gallop Lung:chest clear, no  wheezing, rales, normal symmetric air entry Abdomin: soft, distended and shifting dullness noted.  good bowel sounds without shifting dullness. EXT:no erythema, induration, or nodules. . Skin: Rash noted in the inguinal and inner thigh area.  Lab Results: Lab Results  Component Value Date   WBC 2.9* 06/03/2014   HGB 11.3* 06/03/2014   HCT 36.4 06/03/2014   MCV 94.0 06/03/2014   PLT 79* 06/03/2014     Chemistry      Component Value Date/Time   NA 141 05/13/2014 0956   NA 136 07/03/2012 1359   K 4.1 05/13/2014 0956   K 4.1 07/03/2012 1359   CL 103 07/03/2012 1359   CO2 24 05/13/2014 0956   CO2 30 07/03/2012 1359   BUN 15.1 05/13/2014 0956   BUN 9 07/03/2012 1359   CREATININE 0.7 05/13/2014 0956   CREATININE 0.7 07/03/2012 1359      Component Value Date/Time   CALCIUM 8.8 05/13/2014 0956   CALCIUM 9.6 07/03/2012 1359   ALKPHOS 61 05/13/2014 0956   ALKPHOS 80 07/03/2012 1359   AST 16 05/13/2014 0956   AST 42* 07/03/2012 1359   ALT 10 05/13/2014 0956   ALT 30 07/03/2012 1359   BILITOT 0.27 05/13/2014 0956   BILITOT 0.5 07/03/2012 1359        Results for [name] (MRN [id number]) as of 06/03/2014 11:44  Ref. Range 04/20/2014 08:57 05/13/2014 09:56  CA 125 Latest Ref Range: <35 U/mL 47 (H) 51 (H)     Impression and Plan:  59 year old woman with the following issues:  1. Peritoneal carcinomatosis with omental involvement. The pathology confirmed the presence of adenocarcinoma of likely GYN etiology. Her CA 125 was elevated at 333.5 on 09/19/2012. She is status post systemic chemotherapy with an excellent response utilizing carboplatin and Taxotere and a Avastin . This therapy was held in June of 2015 and currently on Avastin maintenance.   CT scan obtained on 04/20/2014 continues to show stable disease. Her tumor marker continues to be relatively stable and slightly declining. She has tolerated this therapy very well without any complications. The plan is to  continue the current dose and regimen. I plan to repeat imaging studies 4 months from her last scan.  2. IV access: She had a Port-A-Cath inserted without any complications.  3. Thrombocytopenia: Her platelets continue to improve. No bleeding noted. Her platelet count is 79,000.  4. Bilateral thumb pain: Likely related to arthritis I have recommended acetaminophen as needed for the time being. We'll consider referral to hand specialist if needed to.  5. Nausea/diarrhea: Seems to be manageable at this time.  6. The followup: She will followup in 3 weeks for her next cycle of a Avastin.    [facility], MD 4/14/201612:07 PM

## 2014-06-03 NOTE — Telephone Encounter (Signed)
gave adn printed appt sched and avs fo rpt for May....sed added tx.

## 2014-06-03 NOTE — Patient Instructions (Signed)
[facility] Discharge Instructions for Patients Receiving Chemotherapy  Today you received the following chemotherapy agents: Avastin  To help prevent nausea and vomiting after your treatment, we encourage you to take your nausea medication. If you develop nausea and vomiting that is not controlled by your nausea medication, call the clinic.   BELOW ARE SYMPTOMS THAT SHOULD BE REPORTED IMMEDIATELY:  *FEVER GREATER THAN 100.5 F  *CHILLS WITH OR WITHOUT FEVER  NAUSEA AND VOMITING THAT IS NOT CONTROLLED WITH YOUR NAUSEA MEDICATION  *UNUSUAL SHORTNESS OF BREATH  *UNUSUAL BRUISING OR BLEEDING  TENDERNESS IN MOUTH AND THROAT WITH OR WITHOUT PRESENCE OF ULCERS  *URINARY PROBLEMS  *BOWEL PROBLEMS  UNUSUAL RASH Items with * indicate a potential emergency and should be followed up as soon as possible.  Feel free to call the clinic you have any questions or concerns. The clinic phone number is [phone].  Please show the CHEMO ALERT CARD at check-in to the Emergency Department and triage nurse.   

## 2014-06-04 LAB — CA 125: CA 125: 47 U/mL — ABNORMAL HIGH (ref <35)

## 2014-06-09 ENCOUNTER — Other Ambulatory Visit: Payer: Self-pay | Admitting: Oncology

## 2014-06-09 ENCOUNTER — Encounter: Payer: Self-pay | Admitting: Oncology

## 2014-06-24 ENCOUNTER — Ambulatory Visit (HOSPITAL_BASED_OUTPATIENT_CLINIC_OR_DEPARTMENT_OTHER): Payer: BLUE CROSS/BLUE SHIELD

## 2014-06-24 ENCOUNTER — Other Ambulatory Visit (HOSPITAL_BASED_OUTPATIENT_CLINIC_OR_DEPARTMENT_OTHER): Payer: BLUE CROSS/BLUE SHIELD

## 2014-06-24 ENCOUNTER — Ambulatory Visit (HOSPITAL_BASED_OUTPATIENT_CLINIC_OR_DEPARTMENT_OTHER): Payer: BLUE CROSS/BLUE SHIELD | Admitting: Nurse Practitioner

## 2014-06-24 VITALS — BP 125/67 | HR 69

## 2014-06-24 VITALS — BP 141/82 | HR 73 | Temp 97.7°F | Resp 18 | Ht 62.0 in | Wt 167.9 lb

## 2014-06-24 DIAGNOSIS — C569 Malignant neoplasm of unspecified ovary: Secondary | ICD-10-CM

## 2014-06-24 DIAGNOSIS — Z5112 Encounter for antineoplastic immunotherapy: Secondary | ICD-10-CM

## 2014-06-24 DIAGNOSIS — C786 Secondary malignant neoplasm of retroperitoneum and peritoneum: Secondary | ICD-10-CM

## 2014-06-24 DIAGNOSIS — C801 Malignant (primary) neoplasm, unspecified: Secondary | ICD-10-CM

## 2014-06-24 DIAGNOSIS — R110 Nausea: Secondary | ICD-10-CM

## 2014-06-24 LAB — COMPREHENSIVE METABOLIC PANEL (CC13)
ALT: 15 U/L (ref 0–55)
ANION GAP: 10 meq/L (ref 3–11)
AST: 19 U/L (ref 5–34)
Albumin: 3.3 g/dL — ABNORMAL LOW (ref 3.5–5.0)
Alkaline Phosphatase: 68 U/L (ref 40–150)
BUN: 11.4 mg/dL (ref 7.0–26.0)
CALCIUM: 9.2 mg/dL (ref 8.4–10.4)
CHLORIDE: 106 meq/L (ref 98–109)
CO2: 27 mEq/L (ref 22–29)
CREATININE: 0.8 mg/dL (ref 0.6–1.1)
EGFR: 86 mL/min/{1.73_m2} — AB (ref >90)
GLUCOSE: 89 mg/dL (ref 70–140)
Potassium: 5.3 mEq/L — ABNORMAL HIGH (ref 3.5–5.1)
Sodium: 142 mEq/L (ref 136–145)
Total Bilirubin: 0.26 mg/dL (ref 0.20–1.20)
Total Protein: 7.5 g/dL (ref 6.4–8.3)

## 2014-06-24 LAB — CBC WITH DIFFERENTIAL/PLATELET
BASO%: 0.3 % (ref 0.0–2.0)
Basophils Absolute: 0 10*3/uL (ref 0.0–0.1)
EOS%: 4.2 % (ref 0.0–7.0)
Eosinophils Absolute: 0.2 10*3/uL (ref 0.0–0.5)
HCT: 40 % (ref 34.8–46.6)
HGB: 12.1 g/dL (ref 11.6–15.9)
LYMPH%: 44.1 % (ref 14.0–49.7)
MCH: 29.7 pg (ref 25.1–34.0)
MCHC: 30.3 g/dL — AB (ref 31.5–36.0)
MCV: 98 fL (ref 79.5–101.0)
MONO#: 0.4 10*3/uL (ref 0.1–0.9)
MONO%: 10.3 % (ref 0.0–14.0)
NEUT#: 1.6 10*3/uL (ref 1.5–6.5)
NEUT%: 41.1 % (ref 38.4–76.8)
PLATELETS: 83 10*3/uL — AB (ref 145–400)
RBC: 4.08 10*6/uL (ref 3.70–5.45)
RDW: 14.6 % — ABNORMAL HIGH (ref 11.2–14.5)
WBC: 3.8 10*3/uL — ABNORMAL LOW (ref 3.9–10.3)
lymph#: 1.7 10*3/uL (ref 0.9–3.3)

## 2014-06-24 LAB — CA 125: CA 125: 57 U/mL — ABNORMAL HIGH (ref <35)

## 2014-06-24 MED ORDER — SODIUM CHLORIDE 0.9 % IJ SOLN
10.0000 mL | INTRAMUSCULAR | Status: DC | PRN
  Administered 2014-06-24: 10 mL
  Filled 2014-06-24: qty 10

## 2014-06-24 MED ORDER — SODIUM CHLORIDE 0.9 % IV SOLN
14.5000 mg/kg | Freq: Once | INTRAVENOUS | Status: AC
  Administered 2014-06-24: 1100 mg via INTRAVENOUS
  Filled 2014-06-24: qty 44

## 2014-06-24 MED ORDER — HEPARIN SOD (PORK) LOCK FLUSH 100 UNIT/ML IV SOLN
500.0000 [IU] | Freq: Once | INTRAVENOUS | Status: AC | PRN
  Administered 2014-06-24: 500 [IU]
  Filled 2014-06-24: qty 5

## 2014-06-24 MED ORDER — SODIUM CHLORIDE 0.9 % IV SOLN
Freq: Once | INTRAVENOUS | Status: AC
  Administered 2014-06-24: 11:00:00 via INTRAVENOUS

## 2014-06-24 MED ORDER — ONDANSETRON 8 MG PO TBDP
8.0000 mg | ORAL_TABLET | Freq: Three times a day (TID) | ORAL | Status: DC | PRN
Start: 2014-06-24 — End: 2015-10-25

## 2014-06-24 NOTE — Patient Instructions (Signed)
[facility] Discharge Instructions for Patients Receiving Chemotherapy  Today you received the following chemotherapy agents; Avastin.   To help prevent nausea and vomiting after your treatment, we encourage you to take your nausea medication as directed.    If you develop nausea and vomiting that is not controlled by your nausea medication, call the clinic.   BELOW ARE SYMPTOMS THAT SHOULD BE REPORTED IMMEDIATELY:  *FEVER GREATER THAN 100.5 F  *CHILLS WITH OR WITHOUT FEVER  NAUSEA AND VOMITING THAT IS NOT CONTROLLED WITH YOUR NAUSEA MEDICATION  *UNUSUAL SHORTNESS OF BREATH  *UNUSUAL BRUISING OR BLEEDING  TENDERNESS IN MOUTH AND THROAT WITH OR WITHOUT PRESENCE OF ULCERS  *URINARY PROBLEMS  *BOWEL PROBLEMS  UNUSUAL RASH Items with * indicate a potential emergency and should be followed up as soon as possible.  Feel free to call the clinic you have any questions or concerns. The clinic phone number is [phone].    

## 2014-06-24 NOTE — Progress Notes (Signed)
  [facility] OFFICE PROGRESS NOTE   Principle Diagnosis: 59 year old woman diagnosed with peritoneal carcinomatosis and ascites that is biopsy proven to be adenocarcinomaof a GYN etiology. This was diagnosed in July of 2014.   Prior Therapy:  She is status post paracentesis performed on 09/17/2012 with the cytology confirmed the presence of adenocarcinoma and immunohistochemical stains suggest GYN etiology. She is also status post lumpectomy for breast cancer diagnosed 25 years ago followed by radiation and chemotherapy under the care of Dr. Sonny Dandy. She did not receive any hormonal therapy. Chemotherapy utilizing carboplatin and Taxotere started on 10/01/2012. Avastin was added with cycle 4. This was discontinued in June of 2015.  Current therapy: She is currently on a Avastin alone after chemotherapy was discontinued in June of 2015.     INTERVAL HISTORY:   Ms. Mankin returns as scheduled. She continues every three-week Avastin. She has noted increased intermittent nausea over the past few months. No vomiting. She is unable to swallow the Zofran pills when she is nauseated. She denies abdominal distention. Bowels moving regularly. No bleeding. No shortness of breath or chest pain. No leg swelling or calf pain. She denies abdominal pain.  Objective:  Vital signs in last 24 hours:  Blood pressure 141/82, pulse 73, temperature 97.7 F (36.5 C), temperature source Oral, resp. rate 18, height 5\' 2"  (1.575 m), weight 167 lb 14.4 oz (76.159 kg), SpO2 100 %.    HEENT: No thrush or ulcers. Resp: Lungs clear bilaterally. Cardio: Regular rate and rhythm. GI: Abdomen is soft and nontender. No apparent ascites. No hepatomegaly. No mass. Bowel sounds active. Vascular: No leg edema. Calves soft and nontender. Port-A-Cath without erythema.  Lab Results:  Lab Results  Component Value Date   WBC 3.8* 06/24/2014   HGB 12.1 06/24/2014   HCT 40.0 06/24/2014   MCV 98.0 06/24/2014    PLT 83* 06/24/2014   NEUTROABS 1.6 06/24/2014    Imaging:  No results found.  Medications: I have reviewed the patient's current medications.  Assessment/Plan: 1. Peritoneal carcinomatosis with omental involvement. The pathology confirmed the presence of adenocarcinoma of likely GYN etiology. Her CA 125 was elevated at 333.5 on 09/19/2012. She is status post systemic chemotherapy with an excellent response utilizing carboplatin and Taxotere and Avastin. This therapy was held in June of 2015 and she is currently on Avastin maintenance.   CT scan obtained on 04/20/2014 continues to show stable disease. Her tumor marker has been stable. Repeat imaging is planned 4 months from her last scan.  2. IV access: She had a Port-A-Cath inserted without any complications.  3. Thrombocytopenia: Platelet count remains stable.  5. Nausea: She would like to try Zofran ODT. A prescription was sent to her pharmacy.    Disposition: Ms. Hollis appears stable. She will continue every three-week Avastin. She will return for a follow-up visit in 3 weeks.  For the nausea a prescription was sent to her pharmacy for Zofran ODT. She will contact the office if this is not effective or she develops other symptoms such as bloating, constipation.  Plan discussed with Dr. Alen Blew.  Ned Card ANP/GNP-BC   06/24/2014  10:29 AM

## 2014-06-24 NOTE — Progress Notes (Signed)
Per office note, ok to treat with low platelet count

## 2014-07-15 ENCOUNTER — Telehealth: Payer: Self-pay | Admitting: *Deleted

## 2014-07-15 ENCOUNTER — Other Ambulatory Visit: Payer: Self-pay | Admitting: *Deleted

## 2014-07-15 ENCOUNTER — Ambulatory Visit (HOSPITAL_BASED_OUTPATIENT_CLINIC_OR_DEPARTMENT_OTHER): Payer: BLUE CROSS/BLUE SHIELD

## 2014-07-15 ENCOUNTER — Ambulatory Visit (HOSPITAL_BASED_OUTPATIENT_CLINIC_OR_DEPARTMENT_OTHER): Payer: BLUE CROSS/BLUE SHIELD | Admitting: Oncology

## 2014-07-15 ENCOUNTER — Other Ambulatory Visit (HOSPITAL_BASED_OUTPATIENT_CLINIC_OR_DEPARTMENT_OTHER): Payer: BLUE CROSS/BLUE SHIELD

## 2014-07-15 ENCOUNTER — Telehealth: Payer: Self-pay | Admitting: Oncology

## 2014-07-15 VITALS — BP 138/98 | HR 57

## 2014-07-15 VITALS — BP 142/87 | HR 74 | Temp 97.6°F | Resp 18 | Ht 62.0 in | Wt 166.6 lb

## 2014-07-15 DIAGNOSIS — C801 Malignant (primary) neoplasm, unspecified: Secondary | ICD-10-CM

## 2014-07-15 DIAGNOSIS — Z5112 Encounter for antineoplastic immunotherapy: Secondary | ICD-10-CM | POA: Diagnosis not present

## 2014-07-15 DIAGNOSIS — C786 Secondary malignant neoplasm of retroperitoneum and peritoneum: Secondary | ICD-10-CM

## 2014-07-15 DIAGNOSIS — C569 Malignant neoplasm of unspecified ovary: Secondary | ICD-10-CM

## 2014-07-15 DIAGNOSIS — D696 Thrombocytopenia, unspecified: Secondary | ICD-10-CM | POA: Diagnosis not present

## 2014-07-15 LAB — CBC WITH DIFFERENTIAL/PLATELET
BASO%: 0.8 % (ref 0.0–2.0)
BASOS ABS: 0 10*3/uL (ref 0.0–0.1)
EOS%: 4 % (ref 0.0–7.0)
Eosinophils Absolute: 0.1 10*3/uL (ref 0.0–0.5)
HCT: 37.9 % (ref 34.8–46.6)
HGB: 12 g/dL (ref 11.6–15.9)
LYMPH%: 44.9 % (ref 14.0–49.7)
MCH: 29.7 pg (ref 25.1–34.0)
MCHC: 31.7 g/dL (ref 31.5–36.0)
MCV: 93.8 fL (ref 79.5–101.0)
MONO#: 0.3 10*3/uL (ref 0.1–0.9)
MONO%: 8.6 % (ref 0.0–14.0)
NEUT#: 1.4 10*3/uL — ABNORMAL LOW (ref 1.5–6.5)
NEUT%: 41.7 % (ref 38.4–76.8)
Platelets: 82 10*3/uL — ABNORMAL LOW (ref 145–400)
RBC: 4.04 10*6/uL (ref 3.70–5.45)
RDW: 14.9 % — AB (ref 11.2–14.5)
WBC: 3.4 10*3/uL — ABNORMAL LOW (ref 3.9–10.3)
lymph#: 1.5 10*3/uL (ref 0.9–3.3)

## 2014-07-15 LAB — COMPREHENSIVE METABOLIC PANEL (CC13)
ALK PHOS: 66 U/L (ref 40–150)
ALT: 10 U/L (ref 0–55)
ANION GAP: 9 meq/L (ref 3–11)
AST: 19 U/L (ref 5–34)
Albumin: 3.3 g/dL — ABNORMAL LOW (ref 3.5–5.0)
BILIRUBIN TOTAL: 0.29 mg/dL (ref 0.20–1.20)
BUN: 14.5 mg/dL (ref 7.0–26.0)
CALCIUM: 8.9 mg/dL (ref 8.4–10.4)
CHLORIDE: 108 meq/L (ref 98–109)
CO2: 25 meq/L (ref 22–29)
Creatinine: 0.8 mg/dL (ref 0.6–1.1)
EGFR: 86 mL/min/{1.73_m2} — ABNORMAL LOW (ref >90)
GLUCOSE: 106 mg/dL (ref 70–140)
Potassium: 4.9 mEq/L (ref 3.5–5.1)
Sodium: 142 mEq/L (ref 136–145)
Total Protein: 7.5 g/dL (ref 6.4–8.3)

## 2014-07-15 LAB — UA PROTEIN, DIPSTICK - CHCC: Protein, ur: NEGATIVE mg/dL

## 2014-07-15 MED ORDER — SODIUM CHLORIDE 0.9 % IV SOLN
14.5000 mg/kg | Freq: Once | INTRAVENOUS | Status: AC
  Administered 2014-07-15: 1100 mg via INTRAVENOUS
  Filled 2014-07-15: qty 44

## 2014-07-15 MED ORDER — HEPARIN SOD (PORK) LOCK FLUSH 100 UNIT/ML IV SOLN
500.0000 [IU] | Freq: Once | INTRAVENOUS | Status: AC | PRN
  Administered 2014-07-15: 500 [IU]
  Filled 2014-07-15: qty 5

## 2014-07-15 MED ORDER — SODIUM CHLORIDE 0.9 % IJ SOLN
10.0000 mL | INTRAMUSCULAR | Status: DC | PRN
  Administered 2014-07-15: 10 mL
  Filled 2014-07-15: qty 10

## 2014-07-15 MED ORDER — SODIUM CHLORIDE 0.9 % IV SOLN
Freq: Once | INTRAVENOUS | Status: AC
  Administered 2014-07-15: 10:00:00 via INTRAVENOUS

## 2014-07-15 NOTE — Telephone Encounter (Signed)
Per staff message and POF I have scheduled appts. Advised scheduler of appts. JMW  

## 2014-07-15 NOTE — Patient Instructions (Signed)
[facility] Discharge Instructions for Patients Receiving Chemotherapy  Today you received the following chemotherapy agents; Avastin.   To help prevent nausea and vomiting after your treatment, we encourage you to take your nausea medication as directed.    If you develop nausea and vomiting that is not controlled by your nausea medication, call the clinic.   BELOW ARE SYMPTOMS THAT SHOULD BE REPORTED IMMEDIATELY:  *FEVER GREATER THAN 100.5 F  *CHILLS WITH OR WITHOUT FEVER  NAUSEA AND VOMITING THAT IS NOT CONTROLLED WITH YOUR NAUSEA MEDICATION  *UNUSUAL SHORTNESS OF BREATH  *UNUSUAL BRUISING OR BLEEDING  TENDERNESS IN MOUTH AND THROAT WITH OR WITHOUT PRESENCE OF ULCERS  *URINARY PROBLEMS  *BOWEL PROBLEMS  UNUSUAL RASH Items with * indicate a potential emergency and should be followed up as soon as possible.  Feel free to call the clinic you have any questions or concerns. The clinic phone number is [phone].    

## 2014-07-15 NOTE — Telephone Encounter (Signed)
per pof to sch pt appt-sent MW email to sch pt trmt-gave pt copy of sch-pt aware of trmt appts

## 2014-07-15 NOTE — Progress Notes (Signed)
[place] [facility] for treatment today with platelets of 82K per MD note from todays visit 07/15/14

## 2014-07-15 NOTE — Progress Notes (Signed)
Hematology and Oncology Follow Up Visit  [name] [id number] 04-26-1955 59 y.o. 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 This was diagnosed in July of 2014.   Prior Therapy:  She is status post paracentesis performed on 09/17/2012 with the cytology confirmed the presence of adenocarcinoma and immunohistochemical stains suggest GYN etiology. She is also status post lumpectomy for breast cancer diagnosed 25 years ago followed by radiation and chemotherapy under the care of Dr. Sonny Dandy. She did not receive any hormonal therapy. Chemotherapy utilizing carboplatin and Taxotere started on 10/01/2012. Avastin was added with cycle 4. This was discontinued in June of 2015.  Current therapy:  She is currently on a Avastin alone after chemotherapy was discontinued in June of 2015.  Interim History: [name] presents today for a followup visit with her husband. Since her last visit, she reports doing relatively well. She has reported symptoms of sleep cycle disruption and excessive bleeding. She reports that she is gaining weight as well. She has tolerated Avastin without any new complications. She did report rare episodes of epistaxis likely related to dry mucosa. She reports occasional episodes of nausea.   She denied any fever chills.Her mood continues to stable. Has not reported any shortness of breath or cough. She denied any pain with swallowing.She denied fever and chills.  She has reported also grade 1 neuropathy predominantly in her toes, which remains stable.  She has not any shortness of breath or chest pain. She denied any GYN bleeding. She has not reported any increase in her abdominal pain or early satiety. She also reports no hematochezia or melena. She has not reported any syncope alteration of mental status. Does not report any abdominal distention.  Remainder of the review of systems is unremarkable.  Medications: I have reviewed the patient's current medications.  Current Outpatient Prescriptions  Medication Sig Dispense Refill  . ALPRAZolam (XANAX) 1 MG tablet Take 1 mg by mouth at bedtime as needed for anxiety.    . betamethasone dipropionate (DIPROLENE) 0.05 % cream Apply topically 2 (two) times daily. 30 g 0  . divalproex (DEPAKOTE ER) 500 MG 24 hr tablet Take 1,500 mg by mouth at bedtime.     . gabapentin (NEURONTIN) 100 MG capsule TAKE ONE CAPSULE BY MOUTH THREE TIMES A DAY 90 capsule 1  . lamoTRIgine (LAMICTAL) 200 MG tablet Take 200 mg by mouth at bedtime.  1  . levothyroxine (SYNTHROID, LEVOTHROID) 100 MCG tablet Take 100 mcg by mouth daily before breakfast.     . lidocaine-prilocaine (EMLA) cream Apply topically as needed. Apply to port with every chemotherapy. 30 g 1  . OLANZapine-FLUoxetine (SYMBYAX) 6-25 MG per capsule     . ondansetron (ZOFRAN ODT) 8 MG disintegrating tablet Take 1 tablet (8 mg total) by mouth every 8 (eight) hours as needed for nausea or vomiting. 20 tablet 0  . ondansetron (ZOFRAN) 8 MG tablet TAKE 1 TABLET BY MOUTH EVERY 8 HOURS AS NEEDED FOR NAUSEA AND VOMITING 20 tablet 1  . rOPINIRole (REQUIP) 1 MG tablet Take 1 mg by mouth at bedtime.     No current facility-administered medications for this visit.     Allergies: No Known Allergies  Past Medical History, Surgical history, Social history, and Family History were reviewed and updated.   Physical Exam: Blood pressure 142/87, pulse 74, temperature 97.6 F (36.4 C), temperature source Oral, resp.  rate 18, height 5\' 2"  (1.575 m), weight 166 lb 9.6 oz (75.569 kg), SpO2 100 %.  ECOG: 1 General appearance: awake alert pleasant woman not in any distress. Head: Normocephalic, without obvious abnormality Neck: no adenopathy Lymph nodes: Cervical, supraclavicular, and axillary nodes normal. Heart:regular rate and rhythm, S1, S2 normal, no murmur,  click, rub or gallop Lung:chest clear, no wheezing, rales, normal symmetric air entry Abdomin: soft, distended and shifting dullness noted.  good bowel sounds without shifting dullness. EXT:no erythema, induration, or nodules. . Skin: Rash noted in the inguinal and inner thigh area.  Lab Results: Lab Results  Component Value Date   WBC 3.4* 07/15/2014   HGB 12.0 07/15/2014   HCT 37.9 07/15/2014   MCV 93.8 07/15/2014   PLT 82* 07/15/2014     Chemistry      Component Value Date/Time   NA 142 06/24/2014 0913   NA 143 06/03/2014 1142   K 5.3 No visable hemolysis* 06/24/2014 0913   K 4.3 06/03/2014 1142   CL 107 06/03/2014 1142   CO2 27 06/24/2014 0913   CO2 29 06/03/2014 1142   BUN 11.4 06/24/2014 0913   BUN 15 06/03/2014 1142   CREATININE 0.8 06/24/2014 0913   CREATININE 0.74 06/03/2014 1142      Component Value Date/Time   CALCIUM 9.2 06/24/2014 0913   CALCIUM 8.8 06/03/2014 1142   ALKPHOS 68 06/24/2014 0913   ALKPHOS 57 06/03/2014 1142   AST 19 06/24/2014 0913   AST 21 06/03/2014 1142   ALT 15 06/24/2014 0913   ALT 13 06/03/2014 1142   BILITOT 0.26 06/24/2014 0913   BILITOT 0.2* 06/03/2014 1142         Results for [name], [name] (MRN [id number]) as of 07/15/2014 08:46  Ref. Range 05/13/2014 09:56 06/03/2014 11:31 06/24/2014 09:13  CA 125 Latest Ref Range: <35 U/mL 51 (H) 47 (H) 57 (H)     Impression and Plan:  59 year old woman with the following issues:  1. Peritoneal carcinomatosis with omental involvement. The pathology confirmed the presence of adenocarcinoma of likely GYN etiology. Her CA 125 was elevated at 333.5 on 09/19/2012. She is status post systemic chemotherapy with an excellent response utilizing carboplatin and Taxotere and a Avastin . This therapy was held in June of 2015 and currently on Avastin maintenance.   CT scan obtained on 04/20/2014 continues to show stable disease. Her tumor marker continues to be relatively stable and slightly  declining. She has tolerated this therapy very well without any complications. The plan is to continue the current dose and regimen. I plan to repeat imaging studies in July which will be scheduled today.  2. IV access: She had a Port-A-Cath inserted without any complications.  3. Thrombocytopenia: Her platelets continue to improve. No bleeding noted. Her platelet count is 82,000.  4. Weight gain: I have recommended that she follows up with her prior care physician or endocrinologist regarding her thyroid issues that could be contributing.  5. Nausea/diarrhea: Seems to be manageable at this time.  6. The followup: She will followup in 3 weeks for her next cycle of a Avastin.    [facility], MD 5/26/20169:09 AM

## 2014-07-16 LAB — CA 125: CA 125: 57 U/mL — AB (ref <35)

## 2014-07-25 ENCOUNTER — Other Ambulatory Visit: Payer: Self-pay | Admitting: Oncology

## 2014-08-05 ENCOUNTER — Ambulatory Visit (HOSPITAL_BASED_OUTPATIENT_CLINIC_OR_DEPARTMENT_OTHER): Payer: BLUE CROSS/BLUE SHIELD | Admitting: Nurse Practitioner

## 2014-08-05 ENCOUNTER — Ambulatory Visit (HOSPITAL_BASED_OUTPATIENT_CLINIC_OR_DEPARTMENT_OTHER): Payer: BLUE CROSS/BLUE SHIELD

## 2014-08-05 ENCOUNTER — Encounter: Payer: Self-pay | Admitting: Internal Medicine

## 2014-08-05 ENCOUNTER — Other Ambulatory Visit (HOSPITAL_BASED_OUTPATIENT_CLINIC_OR_DEPARTMENT_OTHER): Payer: BLUE CROSS/BLUE SHIELD

## 2014-08-05 VITALS — BP 123/79 | HR 75 | Temp 98.2°F | Resp 20 | Ht 62.0 in | Wt 165.1 lb

## 2014-08-05 VITALS — BP 124/71 | HR 69

## 2014-08-05 DIAGNOSIS — C801 Malignant (primary) neoplasm, unspecified: Secondary | ICD-10-CM

## 2014-08-05 DIAGNOSIS — D696 Thrombocytopenia, unspecified: Secondary | ICD-10-CM | POA: Diagnosis not present

## 2014-08-05 DIAGNOSIS — C786 Secondary malignant neoplasm of retroperitoneum and peritoneum: Secondary | ICD-10-CM

## 2014-08-05 DIAGNOSIS — C569 Malignant neoplasm of unspecified ovary: Secondary | ICD-10-CM

## 2014-08-05 DIAGNOSIS — Z5112 Encounter for antineoplastic immunotherapy: Secondary | ICD-10-CM | POA: Diagnosis not present

## 2014-08-05 DIAGNOSIS — K625 Hemorrhage of anus and rectum: Secondary | ICD-10-CM | POA: Diagnosis not present

## 2014-08-05 DIAGNOSIS — R110 Nausea: Secondary | ICD-10-CM

## 2014-08-05 LAB — CBC WITH DIFFERENTIAL/PLATELET
BASO%: 0.6 % (ref 0.0–2.0)
Basophils Absolute: 0 10*3/uL (ref 0.0–0.1)
EOS%: 4.1 % (ref 0.0–7.0)
Eosinophils Absolute: 0.1 10*3/uL (ref 0.0–0.5)
HCT: 40.6 % (ref 34.8–46.6)
HGB: 12.8 g/dL (ref 11.6–15.9)
LYMPH%: 37.4 % (ref 14.0–49.7)
MCH: 29.5 pg (ref 25.1–34.0)
MCHC: 31.6 g/dL (ref 31.5–36.0)
MCV: 93.6 fL (ref 79.5–101.0)
MONO#: 0.5 10*3/uL (ref 0.1–0.9)
MONO%: 13.4 % (ref 0.0–14.0)
NEUT#: 1.6 10*3/uL (ref 1.5–6.5)
NEUT%: 44.5 % (ref 38.4–76.8)
Platelets: 74 10*3/uL — ABNORMAL LOW (ref 145–400)
RBC: 4.34 10*6/uL (ref 3.70–5.45)
RDW: 14.9 % — AB (ref 11.2–14.5)
WBC: 3.5 10*3/uL — AB (ref 3.9–10.3)
lymph#: 1.3 10*3/uL (ref 0.9–3.3)

## 2014-08-05 LAB — COMPREHENSIVE METABOLIC PANEL (CC13)
ALT: 16 U/L (ref 0–55)
AST: 22 U/L (ref 5–34)
Albumin: 3.4 g/dL — ABNORMAL LOW (ref 3.5–5.0)
Alkaline Phosphatase: 68 U/L (ref 40–150)
Anion Gap: 5 mEq/L (ref 3–11)
BUN: 21.3 mg/dL (ref 7.0–26.0)
CALCIUM: 8.8 mg/dL (ref 8.4–10.4)
CO2: 30 mEq/L — ABNORMAL HIGH (ref 22–29)
Chloride: 107 mEq/L (ref 98–109)
Creatinine: 0.8 mg/dL (ref 0.6–1.1)
EGFR: 81 mL/min/{1.73_m2} — ABNORMAL LOW (ref >90)
GLUCOSE: 105 mg/dL (ref 70–140)
POTASSIUM: 4.5 meq/L (ref 3.5–5.1)
Sodium: 141 mEq/L (ref 136–145)
Total Bilirubin: 0.35 mg/dL (ref 0.20–1.20)
Total Protein: 7.8 g/dL (ref 6.4–8.3)

## 2014-08-05 LAB — UA PROTEIN, DIPSTICK - CHCC: Protein, ur: <30 mg/dL

## 2014-08-05 LAB — CA 125: CA 125: 59 U/mL — AB (ref <35)

## 2014-08-05 MED ORDER — HEPARIN SOD (PORK) LOCK FLUSH 100 UNIT/ML IV SOLN
500.0000 [IU] | Freq: Once | INTRAVENOUS | Status: AC | PRN
Start: 2014-08-05 — End: 2014-08-05
  Administered 2014-08-05: 500 [IU]
  Filled 2014-08-05: qty 5

## 2014-08-05 MED ORDER — SODIUM CHLORIDE 0.9 % IV SOLN
Freq: Once | INTRAVENOUS | Status: AC
  Administered 2014-08-05: 10:00:00 via INTRAVENOUS

## 2014-08-05 MED ORDER — SODIUM CHLORIDE 0.9 % IV SOLN
14.5000 mg/kg | Freq: Once | INTRAVENOUS | Status: AC
  Administered 2014-08-05: 1100 mg via INTRAVENOUS
  Filled 2014-08-05: qty 44

## 2014-08-05 MED ORDER — SODIUM CHLORIDE 0.9 % IJ SOLN
10.0000 mL | INTRAMUSCULAR | Status: DC | PRN
  Administered 2014-08-05: 10 mL
  Filled 2014-08-05: qty 10

## 2014-08-05 NOTE — Patient Instructions (Signed)
[facility] Discharge Instructions for Patients Receiving Chemotherapy  Today you received the following chemotherapy agents Avastin To help prevent nausea and vomiting after your treatment, we encourage you to take your nausea medication as prescribed.   If you develop nausea and vomiting that is not controlled by your nausea medication, call the clinic.   BELOW ARE SYMPTOMS THAT SHOULD BE REPORTED IMMEDIATELY:  *FEVER GREATER THAN 100.5 F  *CHILLS WITH OR WITHOUT FEVER  NAUSEA AND VOMITING THAT IS NOT CONTROLLED WITH YOUR NAUSEA MEDICATION  *UNUSUAL SHORTNESS OF BREATH  *UNUSUAL BRUISING OR BLEEDING  TENDERNESS IN MOUTH AND THROAT WITH OR WITHOUT PRESENCE OF ULCERS  *URINARY PROBLEMS  *BOWEL PROBLEMS  UNUSUAL RASH Items with * indicate a potential emergency and should be followed up as soon as possible.  Feel free to call the clinic you have any questions or concerns. The clinic phone number is [phone].  Please show the CHEMO ALERT CARD at check-in to the Emergency Department and triage nurse.   

## 2014-08-05 NOTE — Progress Notes (Signed)
  [facility] OFFICE PROGRESS NOTE   Diagnosis:  Peritoneal carcinomatosis  INTERVAL HISTORY:   [name] returns as scheduled. She continues every three-week Avastin. She feels well. She has stable mild nausea. Bowels are moving. No black stools. Over the past several weeks she has noted bright red blood with bowel movements as well as blood in the toilet. She denies abdominal pain. She reports she was recently found to have microscopic hematuria. She has been referred to urology. She has a good appetite. No shortness of breath or chest pain. No leg swelling or calf pain.  Objective:  Vital signs in last 24 hours:  Blood pressure 123/79, pulse 75, temperature 98.2 F (36.8 C), temperature source Oral, resp. rate 20, height 5\' 2"  (1.575 m), weight 165 lb 1.6 oz (74.889 kg), SpO2 98 %.    HEENT: No thrush or ulcers. Lymphatics: No palpable cervical or supra-clavicular lymph nodes. Resp: Lungs clear bilaterally. Cardio: Regular rate and rhythm. GI: Abdomen soft and nontender. No hepatomegaly. No mass. No apparent ascites. Vascular: No leg edema. Calves soft and nontender. Neuro: Alert and oriented. Motor strength 5 over 5. Knee DTRs 2+, symmetric.  Skin: No rash. Port-A-Cath without erythema.    Lab Results:  Lab Results  Component Value Date   WBC 3.5* 08/05/2014   HGB 12.8 08/05/2014   HCT 40.6 08/05/2014   MCV 93.6 08/05/2014   PLT 74* 08/05/2014   NEUTROABS 1.6 08/05/2014    Imaging:  No results found.  Medications: I have reviewed the patient's current medications.  Assessment/Plan: 1. Peritoneal carcinomatosis with omental involvement. The pathology confirmed the presence of adenocarcinoma of likely GYN etiology. Her CA 125 was elevated at 333.5 on 09/19/2012. She is status post systemic chemotherapy with an excellent response utilizing carboplatin and Taxotere and Avastin. This therapy was held in June of 2015 and she is currently on Avastin  maintenance.   CT scan obtained on 04/20/2014 continues to show stable disease. Her tumor marker has been stable. Repeat imaging is planned 4 months from her last scan.  2. IV access: She had a Port-A-Cath inserted without any complications.  3. Thrombocytopenia: Platelet count remains stable.  5. Nausea, stable.   6. Rectal bleeding. She will contact her GI physician for an appointment.    Disposition: [name] appears stable. She continues every three-week Avastin. She is scheduled for restaging CT scans 08/24/2014.  I discussed the rectal bleeding with Dr. Alen Blew. We recommended she contact her GI physician. She understands to contact the office if the bleeding increases. Plan to proceed with Avastin today as scheduled.  She will return for a follow-up visit as scheduled on 08/25/2014. She will contact the office in the interim as outlined above or with any other problems.  Plan reviewed with Dr. Alen Blew.    Ned Card ANP/GNP-BC   08/05/2014  9:18 AM

## 2014-08-10 ENCOUNTER — Encounter: Payer: Self-pay | Admitting: Internal Medicine

## 2014-08-24 ENCOUNTER — Ambulatory Visit (HOSPITAL_COMMUNITY)
Admission: RE | Admit: 2014-08-24 | Discharge: 2014-08-24 | Disposition: A | Discharge status: Home / Self Care | Payer: BLUE CROSS/BLUE SHIELD | Source: Ambulatory Visit | Attending: Oncology | Admitting: Oncology

## 2014-08-24 ENCOUNTER — Other Ambulatory Visit (HOSPITAL_BASED_OUTPATIENT_CLINIC_OR_DEPARTMENT_OTHER): Payer: BLUE CROSS/BLUE SHIELD

## 2014-08-24 DIAGNOSIS — C569 Malignant neoplasm of unspecified ovary: Secondary | ICD-10-CM

## 2014-08-24 DIAGNOSIS — Z853 Personal history of malignant neoplasm of breast: Secondary | ICD-10-CM | POA: Diagnosis not present

## 2014-08-24 DIAGNOSIS — C786 Secondary malignant neoplasm of retroperitoneum and peritoneum: Secondary | ICD-10-CM | POA: Diagnosis not present

## 2014-08-24 DIAGNOSIS — C801 Malignant (primary) neoplasm, unspecified: Secondary | ICD-10-CM | POA: Diagnosis not present

## 2014-08-24 LAB — CBC WITH DIFFERENTIAL/PLATELET
BASO%: 0.3 % (ref 0.0–2.0)
Basophils Absolute: 0 10*3/uL (ref 0.0–0.1)
EOS%: 3.6 % (ref 0.0–7.0)
Eosinophils Absolute: 0.1 10*3/uL (ref 0.0–0.5)
HCT: 38.6 % (ref 34.8–46.6)
HGB: 11.8 g/dL (ref 11.6–15.9)
LYMPH#: 1.3 10*3/uL (ref 0.9–3.3)
LYMPH%: 41 % (ref 14.0–49.7)
MCH: 29.9 pg (ref 25.1–34.0)
MCHC: 30.6 g/dL — AB (ref 31.5–36.0)
MCV: 98 fL (ref 79.5–101.0)
MONO#: 0.4 10*3/uL (ref 0.1–0.9)
MONO%: 11.7 % (ref 0.0–14.0)
NEUT#: 1.3 10*3/uL — ABNORMAL LOW (ref 1.5–6.5)
NEUT%: 43.4 % (ref 38.4–76.8)
NRBC: 0 % (ref 0–0)
Platelets: 67 10*3/uL — ABNORMAL LOW (ref 145–400)
RBC: 3.94 10*6/uL (ref 3.70–5.45)
RDW: 14.7 % — AB (ref 11.2–14.5)
WBC: 3.1 10*3/uL — AB (ref 3.9–10.3)

## 2014-08-24 LAB — COMPREHENSIVE METABOLIC PANEL (CC13)
ALT: 12 U/L (ref 0–55)
AST: 21 U/L (ref 5–34)
Albumin: 3.3 g/dL — ABNORMAL LOW (ref 3.5–5.0)
Alkaline Phosphatase: 61 U/L (ref 40–150)
Anion Gap: 8 mEq/L (ref 3–11)
BILIRUBIN TOTAL: 0.27 mg/dL (ref 0.20–1.20)
BUN: 18 mg/dL (ref 7.0–26.0)
CO2: 24 mEq/L (ref 22–29)
CREATININE: 0.8 mg/dL (ref 0.6–1.1)
Calcium: 9.1 mg/dL (ref 8.4–10.4)
Chloride: 108 mEq/L (ref 98–109)
EGFR: 86 mL/min/{1.73_m2} — ABNORMAL LOW (ref >90)
Glucose: 108 mg/dl (ref 70–140)
Potassium: 4.7 mEq/L (ref 3.5–5.1)
Sodium: 140 mEq/L (ref 136–145)
TOTAL PROTEIN: 7.3 g/dL (ref 6.4–8.3)

## 2014-08-24 MED ORDER — IOHEXOL 300 MG/ML  SOLN
100.0000 mL | Freq: Once | INTRAMUSCULAR | Status: AC | PRN
  Administered 2014-08-24: 100 mL via INTRAVENOUS

## 2014-08-25 ENCOUNTER — Ambulatory Visit (HOSPITAL_BASED_OUTPATIENT_CLINIC_OR_DEPARTMENT_OTHER): Payer: BLUE CROSS/BLUE SHIELD | Admitting: Oncology

## 2014-08-25 ENCOUNTER — Telehealth: Payer: Self-pay | Admitting: *Deleted

## 2014-08-25 ENCOUNTER — Telehealth: Payer: Self-pay | Admitting: Oncology

## 2014-08-25 ENCOUNTER — Ambulatory Visit (HOSPITAL_BASED_OUTPATIENT_CLINIC_OR_DEPARTMENT_OTHER): Payer: BLUE CROSS/BLUE SHIELD

## 2014-08-25 VITALS — BP 149/73 | HR 67 | Temp 98.1°F | Resp 18 | Ht 62.0 in | Wt 166.9 lb

## 2014-08-25 VITALS — BP 120/83 | HR 58

## 2014-08-25 DIAGNOSIS — Z5112 Encounter for antineoplastic immunotherapy: Secondary | ICD-10-CM | POA: Diagnosis not present

## 2014-08-25 DIAGNOSIS — C786 Secondary malignant neoplasm of retroperitoneum and peritoneum: Secondary | ICD-10-CM

## 2014-08-25 DIAGNOSIS — C569 Malignant neoplasm of unspecified ovary: Secondary | ICD-10-CM

## 2014-08-25 DIAGNOSIS — C801 Malignant (primary) neoplasm, unspecified: Secondary | ICD-10-CM

## 2014-08-25 DIAGNOSIS — D696 Thrombocytopenia, unspecified: Secondary | ICD-10-CM | POA: Diagnosis not present

## 2014-08-25 LAB — CA 125: CA 125: 53 U/mL — AB (ref <35)

## 2014-08-25 MED ORDER — SODIUM CHLORIDE 0.9 % IJ SOLN
10.0000 mL | INTRAMUSCULAR | Status: DC | PRN
  Administered 2014-08-25: 10 mL
  Filled 2014-08-25: qty 10

## 2014-08-25 MED ORDER — SODIUM CHLORIDE 0.9 % IV SOLN
14.5000 mg/kg | Freq: Once | INTRAVENOUS | Status: AC
  Administered 2014-08-25: 1100 mg via INTRAVENOUS
  Filled 2014-08-25: qty 44

## 2014-08-25 MED ORDER — SODIUM CHLORIDE 0.9 % IV SOLN
Freq: Once | INTRAVENOUS | Status: AC
  Administered 2014-08-25: 10:00:00 via INTRAVENOUS

## 2014-08-25 MED ORDER — HEPARIN SOD (PORK) LOCK FLUSH 100 UNIT/ML IV SOLN
500.0000 [IU] | Freq: Once | INTRAVENOUS | Status: AC | PRN
  Administered 2014-08-25: 500 [IU]
  Filled 2014-08-25: qty 5

## 2014-08-25 NOTE — Telephone Encounter (Signed)
Per staff message and POF I have scheduled appts. Advised scheduler of appts. JMW  

## 2014-08-25 NOTE — Progress Notes (Signed)
Hematology and Oncology Follow Up Visit  [name] [id number] August 18, 1955 59 y.o. 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 This was diagnosed in July of 2014.   Prior Therapy:  She is status post paracentesis performed on 09/17/2012 with the cytology confirmed the presence of adenocarcinoma and immunohistochemical stains suggest GYN etiology. She is also status post lumpectomy for breast cancer diagnosed 25 years ago followed by radiation and chemotherapy under the care of Dr. Sonny Dandy. She did not receive any hormonal therapy. Chemotherapy utilizing carboplatin and Taxotere started on 10/01/2012. Avastin was added with cycle 4. This was discontinued in June of 2015.  Current therapy:  She is currently on a Avastin maintenance only after chemotherapy was discontinued in June of 2015.  Interim History: [name] presents today for a followup visit with her husband. Since her last visit, she reports no new complaints. She reports occasional issues with sleep that have been sleeping better recently. She did have one episode of epistaxis but very minor and stopped spontaneously. She reports that she is gaining weight as well. She has tolerated Avastin without any new complications. She has not reported any infusion-related complications. She continues to perform activities of daily living without any decline. She has not reported any abdominal distention or early satiety. Her quality of life remains excellent.  She denied any fever chills.Her mood continues to stable. Has not reported any shortness of breath or cough. She denied any pain with swallowing.She denied fever and chills.  She has reported also grade 1 neuropathy which remains stable.  She has not any shortness of breath or chest pain. She denied any GYN bleeding. She has not reported any increase in her  abdominal pain or early satiety. She also reports no hematochezia or melena. She has not reported any syncope alteration of mental status. Does not report any abdominal distention. Remainder of the review of systems is unremarkable.  Medications: I have reviewed the patient's current medications.  Current Outpatient Prescriptions  Medication Sig Dispense Refill  . ALPRAZolam (XANAX) 1 MG tablet Take 1 mg by mouth at bedtime as needed for anxiety.    . betamethasone dipropionate (DIPROLENE) 0.05 % cream Apply topically 2 (two) times daily. 30 g 0  . divalproex (DEPAKOTE ER) 500 MG 24 hr tablet Take 1,500 mg by mouth at bedtime.     . gabapentin (NEURONTIN) 100 MG capsule TAKE ONE CAPSULE BY MOUTH THREE TIMES A DAY 90 capsule 1  . lamoTRIgine (LAMICTAL) 200 MG tablet Take 200 mg by mouth at bedtime.  1  . levothyroxine (SYNTHROID, LEVOTHROID) 100 MCG tablet Take 100 mcg by mouth daily before breakfast.     . lidocaine-prilocaine (EMLA) cream Apply topically as needed. Apply to port with every chemotherapy. 30 g 1  . OLANZapine-FLUoxetine (SYMBYAX) 6-25 MG per capsule     . ondansetron (ZOFRAN ODT) 8 MG disintegrating tablet Take 1 tablet (8 mg total) by mouth every 8 (eight) hours as needed for nausea or vomiting. 20 tablet 0  . ondansetron (ZOFRAN) 8 MG tablet TAKE 1 TABLET BY MOUTH EVERY 8 HOURS AS NEEDED FOR NAUSEA AND VOMITING 20 tablet 1  . rOPINIRole (REQUIP) 1 MG tablet Take 1 mg by mouth at bedtime.     No current facility-administered medications for this visit.     Allergies: No Known Allergies  Past Medical History, Surgical history, Social  history, and Family History were reviewed and updated.   Physical Exam: Blood pressure 149/73, pulse 67, temperature 98.1 F (36.7 C), temperature source Oral, resp. rate 18, height 5\' 2"  (1.575 m), weight 166 lb 14.4 oz (75.705 kg), SpO2 100 %.  ECOG: 1 General appearance: awake alert pleasant woman with excellent mood. Head:  Normocephalic, without obvious abnormality. Her sclerae was anicteric. Neck: no adenopathy Lymph nodes: Cervical, supraclavicular, and axillary nodes normal. Heart:regular rate and rhythm, S1, S2 normal, no murmur, click, rub or gallop Lung:chest clear, no wheezing, rales, normal symmetric air entry Abdomin: Soft, nontender with good bowel sounds. Cannot appreciate any shifting dullness or ascites. EXT:no erythema, induration, or nodules. . Skin: No rashes or lesions.  Lab Results: Lab Results  Component Value Date   WBC 3.1* 08/24/2014   HGB 11.8 08/24/2014   HCT 38.6 08/24/2014   MCV 98.0 08/24/2014   PLT 67* 08/24/2014     Chemistry      Component Value Date/Time   NA 140 08/24/2014 0837   NA 143 06/03/2014 1142   K 4.7 08/24/2014 0837   K 4.3 06/03/2014 1142   CL 107 06/03/2014 1142   CO2 24 08/24/2014 0837   CO2 29 06/03/2014 1142   BUN 18.0 08/24/2014 0837   BUN 15 06/03/2014 1142   CREATININE 0.8 08/24/2014 0837   CREATININE 0.74 06/03/2014 1142      Component Value Date/Time   CALCIUM 9.1 08/24/2014 0837   CALCIUM 8.8 06/03/2014 1142   ALKPHOS 61 08/24/2014 0837   ALKPHOS 57 06/03/2014 1142   AST 21 08/24/2014 0837   AST 21 06/03/2014 1142   ALT 12 08/24/2014 0837   ALT 13 06/03/2014 1142   BILITOT 0.27 08/24/2014 0837   BILITOT 0.2* 06/03/2014 1142     Results for [name], [name] (MRN [id number]) as of 08/25/2014 09:16  Ref. Range 07/15/2014 08:34 08/05/2014 08:42 08/24/2014 08:37  CA 125 Latest Ref Range: <35 U/mL 57 (H) 59 (H) 53 (H)       EXAM: CT CHEST, ABDOMEN, AND PELVIS WITH CONTRAST  TECHNIQUE: Multidetector CT imaging of the chest, abdomen and pelvis was performed following the standard protocol during bolus administration of intravenous contrast.  CONTRAST: 169mL OMNIPAQUE IOHEXOL 300 MG/ML SOLN  COMPARISON: 04/20/2014  FINDINGS: CT CHEST FINDINGS  Mediastinum/Lymph Nodes: No masses or pathologically enlarged lymph nodes  identified. No axillary lymphadenopathy identified. Postop changes again seen from previous right mastectomy and axillary lymph node dissection.  Lungs/Pleura: No pulmonary infiltrate or mass identified. No effusion present.  Musculoskeletal/Soft Tissues: No suspicious bone lesions or other significant chest wall abnormality.  CT ABDOMEN AND PELVIS FINDINGS  Hepatobiliary: No masses or other significant abnormality identified.  Pancreas: No mass, inflammatory changes, or other significant abnormality identified.  Spleen: Within normal limits in size and appearance.  Adrenals: No masses identified.  Kidneys/Urinary Tract: No evidence of masses or hydronephrosis.  Stomach/Bowel/Peritoneum: Soft tissue masses throughout the omental fat showed no significant overall change in size or extent. Largest index masses in the anterior left abdomen measure 2.4 x 9.6 cm on image 79 compared with 2.4 x 9.8 cm previously, and in the right paracolic gutter measure 2.4 x 7.6 cm compared to 2.3 x 7.6 cm previously. No new or increased areas of 6C soft tissue density seen. No evidence of ascites. No evidence of bowel obstruction.  Vascular/Lymphatic: No pathologically enlarged lymph nodes identified. No abdominal aortic aneurysm or other significant retroperitoneal abnormality demonstrated.  Reproductive: No mass or other  significant abnormality identified.  Other: None.  Musculoskeletal: No suspicious bone lesions identified.  IMPRESSION: No significant change in diffuse omental carcinomatosis. No new or progressive disease identified.  No evidence of thoracic metastatic disease.    Impression and Plan:  59 year old woman with the following issues:  1. Peritoneal carcinomatosis with omental involvement. The pathology confirmed the presence of adenocarcinoma of likely GYN etiology. Her CA 125 was elevated at 333.5 on 09/19/2012. She is status post systemic  chemotherapy with an excellent response utilizing carboplatin and Taxotere and a Avastin . This therapy was held in June of 2015 and currently on Avastin maintenance.   CT scan obtained on 08/24/2014 was reviewed today and discussed with the patient and her family. It continues to show stable disease. Her tumor marker continues to be relatively stable. She has tolerated this therapy very well without any complications. I believe maintenance Avastin have helped her disease to be under control and impacted her quality of life positively. I plan on keeping her on the same dose and schedule and repeat imaging studies in 4 months.  2. IV access: She had a Port-A-Cath inserted without any complications.  3. Thrombocytopenia: Her platelets continues to fluctuate without any persistent bleeding. She does not require any growth factor support and transfusions.  4. Weight gain: Appear to be stable at this time.  5. Nausea/diarrhea: Seems to be manageable at this time.  6. The followup: She will followup in 3 weeks for her next cycle of a Avastin.    [facility], MD 7/6/20169:26 AM

## 2014-08-25 NOTE — Patient Instructions (Signed)
[facility] Discharge Instructions for Patients Receiving Chemotherapy  Today you received the following chemotherapy agent: Avastin   To help prevent nausea and vomiting after your treatment, we encourage you to take your nausea medication as prescribed.    If you develop nausea and vomiting that is not controlled by your nausea medication, call the clinic.   BELOW ARE SYMPTOMS THAT SHOULD BE REPORTED IMMEDIATELY:  *FEVER GREATER THAN 100.5 F  *CHILLS WITH OR WITHOUT FEVER  NAUSEA AND VOMITING THAT IS NOT CONTROLLED WITH YOUR NAUSEA MEDICATION  *UNUSUAL SHORTNESS OF BREATH  *UNUSUAL BRUISING OR BLEEDING  TENDERNESS IN MOUTH AND THROAT WITH OR WITHOUT PRESENCE OF ULCERS  *URINARY PROBLEMS  *BOWEL PROBLEMS  UNUSUAL RASH Items with * indicate a potential emergency and should be followed up as soon as possible.  Feel free to call the clinic you have any questions or concerns. The clinic phone number is [phone].  Please show the CHEMO ALERT CARD at check-in to the Emergency Department and triage nurse.   

## 2014-08-25 NOTE — Telephone Encounter (Signed)
Pt confirmed labs/ov per 07/06 POF, gave pt AVS and Calendar.... KJ, sent msg to add chemo

## 2014-09-01 ENCOUNTER — Telehealth: Payer: Self-pay | Admitting: *Deleted

## 2014-09-01 ENCOUNTER — Ambulatory Visit (INDEPENDENT_AMBULATORY_CARE_PROVIDER_SITE_OTHER): Payer: BLUE CROSS/BLUE SHIELD | Admitting: Physician Assistant

## 2014-09-01 ENCOUNTER — Encounter: Payer: Self-pay | Admitting: Physician Assistant

## 2014-09-01 VITALS — BP 118/78 | HR 78 | Ht 62.0 in | Wt 166.6 lb

## 2014-09-01 DIAGNOSIS — K921 Melena: Secondary | ICD-10-CM

## 2014-09-01 MED ORDER — NA SULFATE-K SULFATE-MG SULF 17.5-3.13-1.6 GM/177ML PO SOLN: ORAL | Status: DC

## 2014-09-01 NOTE — Progress Notes (Signed)
Reviewed and agree,

## 2014-09-01 NOTE — Patient Instructions (Signed)
You have been scheduled for a colonoscopy. Please follow written instructions given to you at your visit today.  Please pick up your prep supplies at the pharmacy within the next 1-3 days. If you use inhalers (even only as needed), please bring them with you on the day of your procedure.   

## 2014-09-01 NOTE — Progress Notes (Signed)
Patient ID: [name], female   DOB: [birth year], 59 y.o.   MRN: [id number]     History of Present Illness: [name] is a pleasant 59 year old female known to Dr. Olevia Perches. She had a colonoscopy in 2004 which was normal with the exception of internal hemorrhoids, and she had an EGD in 2009 that showed reflux esophagitis. She has a past medical history of obstructive sleep apnea, esophageal strictures, breast cancer, bipolar disorder, ADD, depression, anxiety, type 2 diabetes, osteopenia, hypothyroidism, hyperlipidemia. She was last seen in May 2014 with complaints of bloating. She had a paracentesis on 09/17/2012 with cytology that confirmed the presence of adenocarcinoma an immune O histochemical stains suggested GYN etiology. She is status post lumpectomy for breast cancer diagnosed 25 years ago followed by radiation and chemotherapy. She did not receive hormonal therapy. In August 2014 she started chemotherapy utilizing carboplatin and Taxotere. She is currently on Avastin maintenance only after her chemotherapy was discontinued in June 2015. She had been advised to have a repeat colonoscopy in 2010 but was not able to do so. She recently received a reminder card in the mail and states she would like to schedule colonoscopy at this time. She denies any change in her bowel habits or stool caliber, but she has had intermittent episodes of blood streaking on the stool and blood mixed in with the stool. She denies abdominal pain. She denies abdominal distention or early satiety. She states overall she feels well.   Past Medical History  Diagnosis Date  . Premature menopause   . Osteopenia 03/2009    t score -2.1 FRAX 4.6/0.4  . Hypothyroidism   . Depression   . Anxiety   . Hyperlipidemia   . Bipolar disorder   . ADD (attention deficit disorder)   . Diabetes mellitus   . Breast cancer 1989    Past Surgical History  Procedure Laterality Date  . Breast surgery  1989    RIGHT LUMPECTOMY,  RADIATION AND CHEMO  . Hysteroscopy  2011    Polyp  . Pelvic laparoscopy/ hysteroscopy  1996  . Foot surgery  2013    BILATERAL    Family History  Problem Relation Age of Onset  . Breast cancer Maternal Aunt 30  . Diabetes Maternal Grandmother   . Heart disease Maternal Grandmother   . Heart disease Maternal Grandfather   . Hypertension Paternal Grandfather   . Breast cancer Maternal Aunt 70  . Breast cancer Maternal Aunt 5  . Ovarian cancer Neg Hx   . Allergies Father    History  Substance Use Topics  . Smoking status: Never Smoker   . Smokeless tobacco: Never Used  . Alcohol Use: Yes     Comment: very rarely   Current Outpatient Prescriptions  Medication Sig Dispense Refill  . ALPRAZolam (XANAX) 1 MG tablet Take 1 mg by mouth at bedtime as needed for anxiety.    . betamethasone dipropionate (DIPROLENE) 0.05 % cream Apply topically 2 (two) times daily. 30 g 0  . calcium carbonate (TUMS - DOSED IN MG ELEMENTAL CALCIUM) 500 MG chewable tablet Chew 1 tablet by mouth as needed for indigestion or heartburn.    . divalproex (DEPAKOTE ER) 500 MG 24 hr tablet Take 1,500 mg by mouth at bedtime.     . gabapentin (NEURONTIN) 100 MG capsule TAKE ONE CAPSULE BY MOUTH THREE TIMES A DAY 90 capsule 1  . lamoTRIgine (LAMICTAL) 200 MG tablet Take 200 mg by mouth at bedtime.  1  .  levothyroxine (SYNTHROID, LEVOTHROID) 100 MCG tablet Take 100 mcg by mouth daily before breakfast.     . lidocaine-prilocaine (EMLA) cream Apply topically as needed. Apply to port with every chemotherapy. 30 g 1  . loperamide (IMODIUM A-D) 2 MG tablet Take 2 mg by mouth as needed for diarrhea or loose stools.    Marland Kitchen OLANZapine-FLUoxetine (SYMBYAX) 6-25 MG per capsule     . omeprazole (PRILOSEC) 10 MG capsule Take 10 mg by mouth daily.    . ondansetron (ZOFRAN ODT) 8 MG disintegrating tablet Take 1 tablet (8 mg total) by mouth every 8 (eight) hours as needed for nausea or vomiting. 20 tablet 0  . ondansetron (ZOFRAN) 8  MG tablet TAKE 1 TABLET BY MOUTH EVERY 8 HOURS AS NEEDED FOR NAUSEA AND VOMITING 20 tablet 1  . rOPINIRole (REQUIP) 1 MG tablet Take 1 mg by mouth at bedtime.    . Na Sulfate-K Sulfate-Mg Sulf SOLN Please take as directed per colonoscopy instructions 354 mL 0   No current facility-administered medications for this visit.   No Known Allergies    Review of Systems: Gen: Denies any fever, chills, sweats, anorexia, fatigue, weakness, malaise, weight loss. CV: Denies chest pain, angina, palpitations, syncope, orthopnea, PND, peripheral edema, and claudication. Resp: Denies dyspnea at rest, dyspnea with exercise, cough, sputum, wheezing, coughing up blood, and pleurisy. GI: Denies vomiting blood, jaundice, and fecal incontinence.   Denies dysphagia or odynophagia. GU : Denies urinary burning, blood in urine, urinary frequency, urinary hesitancy, nocturnal urination, and urinary incontinence. MS: Denies joint pain, limitation of movement, and swelling, stiffness, low back pain, extremity pain. Denies muscle weakness, cramps, atrophy.  Derm: Denies rash, itching, dry skin, hives, moles, warts, or unhealing ulcers.  Psych: Denies depression, anxiety, memory loss, suicidal ideation, hallucinations, paranoia, and confusion. Heme: Denies bruising, bleeding, and enlarged lymph nodes. Neuro: She reports she has intermittent balance disorder. Endo:  Denies any problems with DM, thyroid, adrenal  LAB RESULTS: CBC on 08/24/2014 white count 3.1, hemoglobin 11.8, hematocrit 38.6, platelets 67,000. MCV 98. CA 125 on 08/24/2014 53, on 09/04/2014 59, on 07/15/2014 57.  Studies:   Ct Chest W Contrast  08/24/2014   CLINICAL DATA:  Followup metastatic ovarian/omental carcinoma. Ongoing chemotherapy. Restaging. Personal history right breast carcinoma.  EXAM: CT CHEST, ABDOMEN, AND PELVIS WITH CONTRAST  TECHNIQUE: Multidetector CT imaging of the chest, abdomen and pelvis was performed following the standard  protocol during bolus administration of intravenous contrast.  CONTRAST:  120mL OMNIPAQUE IOHEXOL 300 MG/ML  SOLN  COMPARISON:  04/20/2014  FINDINGS: CT CHEST FINDINGS  Mediastinum/Lymph Nodes: No masses or pathologically enlarged lymph nodes identified. No axillary lymphadenopathy identified. Postop changes again seen from previous right mastectomy and axillary lymph node dissection.  Lungs/Pleura: No pulmonary infiltrate or mass identified. No effusion present.  Musculoskeletal/Soft Tissues: No suspicious bone lesions or other significant chest wall abnormality.  CT ABDOMEN AND PELVIS FINDINGS  Hepatobiliary: No masses or other significant abnormality identified.  Pancreas: No mass, inflammatory changes, or other significant abnormality identified.  Spleen:  Within normal limits in size and appearance.  Adrenals:  No masses identified.  Kidneys/Urinary Tract:  No evidence of masses or hydronephrosis.  Stomach/Bowel/Peritoneum: Soft tissue masses throughout the omental fat showed no significant overall change in size or extent. Largest index masses in the anterior left abdomen measure 2.4 x 9.6 cm on image 79 compared with 2.4 x 9.8 cm previously, and in the right paracolic gutter measure 2.4 x 7.6 cm compared to 2.3  x 7.6 cm previously. No new or increased areas of 6C soft tissue density seen. No evidence of ascites. No evidence of bowel obstruction.  Vascular/Lymphatic: No pathologically enlarged lymph nodes identified. No abdominal aortic aneurysm or other significant retroperitoneal abnormality demonstrated.  Reproductive:  No mass or other significant abnormality identified.  Other:  None.  Musculoskeletal:  No suspicious bone lesions identified.  IMPRESSION: No significant change in diffuse omental carcinomatosis. No new or progressive disease identified.  No evidence of thoracic metastatic disease.   Electronically Signed   By: Earle Gell M.D.   On: 08/24/2014 12:56   Ct Abdomen Pelvis W  Contrast  08/24/2014   CLINICAL DATA:  Followup metastatic ovarian/omental carcinoma. Ongoing chemotherapy. Restaging. Personal history right breast carcinoma.  EXAM: CT CHEST, ABDOMEN, AND PELVIS WITH CONTRAST  TECHNIQUE: Multidetector CT imaging of the chest, abdomen and pelvis was performed following the standard protocol during bolus administration of intravenous contrast.  CONTRAST:  1103mL OMNIPAQUE IOHEXOL 300 MG/ML  SOLN  COMPARISON:  04/20/2014  FINDINGS: CT CHEST FINDINGS  Mediastinum/Lymph Nodes: No masses or pathologically enlarged lymph nodes identified. No axillary lymphadenopathy identified. Postop changes again seen from previous right mastectomy and axillary lymph node dissection.  Lungs/Pleura: No pulmonary infiltrate or mass identified. No effusion present.  Musculoskeletal/Soft Tissues: No suspicious bone lesions or other significant chest wall abnormality.  CT ABDOMEN AND PELVIS FINDINGS  Hepatobiliary: No masses or other significant abnormality identified.  Pancreas: No mass, inflammatory changes, or other significant abnormality identified.  Spleen:  Within normal limits in size and appearance.  Adrenals:  No masses identified.  Kidneys/Urinary Tract:  No evidence of masses or hydronephrosis.  Stomach/Bowel/Peritoneum: Soft tissue masses throughout the omental fat showed no significant overall change in size or extent. Largest index masses in the anterior left abdomen measure 2.4 x 9.6 cm on image 79 compared with 2.4 x 9.8 cm previously, and in the right paracolic gutter measure 2.4 x 7.6 cm compared to 2.3 x 7.6 cm previously. No new or increased areas of 6C soft tissue density seen. No evidence of ascites. No evidence of bowel obstruction.  Vascular/Lymphatic: No pathologically enlarged lymph nodes identified. No abdominal aortic aneurysm or other significant retroperitoneal abnormality demonstrated.  Reproductive:  No mass or other significant abnormality identified.  Other:  None.   Musculoskeletal:  No suspicious bone lesions identified.  IMPRESSION: No significant change in diffuse omental carcinomatosis. No new or progressive disease identified.  No evidence of thoracic metastatic disease.   Electronically Signed   By: Earle Gell M.D.   On: 08/24/2014 12:56     Physical Exam: General: Pleasant, well developed female in no acute distress Head: Normocephalic and atraumatic Eyes:  sclerae anicteric, conjunctiva pink  Ears: Normal auditory acuity Lungs: Clear throughout to auscultation Heart: Regular rate and rhythm Abdomen: Soft, non distended, non-tender. No masses, no hepatomegaly. Normal bowel sounds Musculoskeletal: Symmetrical with no gross deformities  Extremities: No edema  Neurological: Alert oriented x 4, grossly nonfocal Psychological:  Alert and cooperative. Normal mood and affect  Assessment and Recommendations: 59 year old female with peritoneal carcinomatosis with omental involvement here to schedule a colonoscopy. Patient has been moving her bowels fairly regularly but has had intermittent hematochezia which may be due to the internal hemorrhoids noted on her prior colonoscopy. Patient has been noted to have a [place] cytopenia. Her platelets have been fluctuating without persistent bleeding. Will repeat CBC 2 weeks prior to colonoscopy.The risks, benefits, and alternatives to colonoscopy with possible biopsy and possible  polypectomy were discussed with the patient and they consent to proceed. The procedure will be scheduled with Dr. Olevia Perches. Further recommendations will be made pending the findings of the above.        [name], Deloris Ping 09/01/2014,

## 2014-09-01 NOTE — Telephone Encounter (Signed)
Patient called yesterday to move her appts from 8/17 to 8/19. Patient notified

## 2014-09-15 ENCOUNTER — Ambulatory Visit (HOSPITAL_BASED_OUTPATIENT_CLINIC_OR_DEPARTMENT_OTHER): Payer: BLUE CROSS/BLUE SHIELD

## 2014-09-15 ENCOUNTER — Other Ambulatory Visit: Payer: Self-pay | Admitting: *Deleted

## 2014-09-15 ENCOUNTER — Other Ambulatory Visit (HOSPITAL_BASED_OUTPATIENT_CLINIC_OR_DEPARTMENT_OTHER): Payer: BLUE CROSS/BLUE SHIELD

## 2014-09-15 ENCOUNTER — Ambulatory Visit (HOSPITAL_BASED_OUTPATIENT_CLINIC_OR_DEPARTMENT_OTHER): Payer: BLUE CROSS/BLUE SHIELD | Admitting: Nurse Practitioner

## 2014-09-15 VITALS — BP 138/74 | HR 69 | Temp 98.0°F | Resp 18 | Ht 62.0 in | Wt 165.9 lb

## 2014-09-15 DIAGNOSIS — C569 Malignant neoplasm of unspecified ovary: Secondary | ICD-10-CM

## 2014-09-15 DIAGNOSIS — C801 Malignant (primary) neoplasm, unspecified: Secondary | ICD-10-CM | POA: Diagnosis not present

## 2014-09-15 DIAGNOSIS — C786 Secondary malignant neoplasm of retroperitoneum and peritoneum: Secondary | ICD-10-CM | POA: Diagnosis not present

## 2014-09-15 DIAGNOSIS — Z5112 Encounter for antineoplastic immunotherapy: Secondary | ICD-10-CM | POA: Diagnosis not present

## 2014-09-15 DIAGNOSIS — R110 Nausea: Secondary | ICD-10-CM | POA: Diagnosis not present

## 2014-09-15 DIAGNOSIS — D696 Thrombocytopenia, unspecified: Secondary | ICD-10-CM

## 2014-09-15 DIAGNOSIS — K625 Hemorrhage of anus and rectum: Secondary | ICD-10-CM

## 2014-09-15 LAB — CBC WITH DIFFERENTIAL/PLATELET
BASO%: 0.7 % (ref 0.0–2.0)
Basophils Absolute: 0 10*3/uL (ref 0.0–0.1)
EOS%: 3 % (ref 0.0–7.0)
Eosinophils Absolute: 0.1 10*3/uL (ref 0.0–0.5)
HCT: 37.1 % (ref 34.8–46.6)
HGB: 11.8 g/dL (ref 11.6–15.9)
LYMPH%: 33.4 % (ref 14.0–49.7)
MCH: 29.7 pg (ref 25.1–34.0)
MCHC: 31.7 g/dL (ref 31.5–36.0)
MCV: 93.5 fL (ref 79.5–101.0)
MONO#: 0.5 10*3/uL (ref 0.1–0.9)
MONO%: 13.1 % (ref 0.0–14.0)
NEUT#: 1.8 10*3/uL (ref 1.5–6.5)
NEUT%: 49.8 % (ref 38.4–76.8)
Platelets: 74 10*3/uL — ABNORMAL LOW (ref 145–400)
RBC: 3.97 10*6/uL (ref 3.70–5.45)
RDW: 15.1 % — ABNORMAL HIGH (ref 11.2–14.5)
WBC: 3.7 10*3/uL — ABNORMAL LOW (ref 3.9–10.3)
lymph#: 1.2 10*3/uL (ref 0.9–3.3)

## 2014-09-15 LAB — COMPREHENSIVE METABOLIC PANEL (CC13)
ALBUMIN: 3.5 g/dL (ref 3.5–5.0)
ALT: 15 U/L (ref 0–55)
AST: 26 U/L (ref 5–34)
Alkaline Phosphatase: 57 U/L (ref 40–150)
Anion Gap: 6 mEq/L (ref 3–11)
BILIRUBIN TOTAL: 0.36 mg/dL (ref 0.20–1.20)
BUN: 18.8 mg/dL (ref 7.0–26.0)
CALCIUM: 9.1 mg/dL (ref 8.4–10.4)
CHLORIDE: 107 meq/L (ref 98–109)
CO2: 27 mEq/L (ref 22–29)
CREATININE: 0.8 mg/dL (ref 0.6–1.1)
EGFR: 81 mL/min/{1.73_m2} — AB (ref >90)
GLUCOSE: 83 mg/dL (ref 70–140)
Potassium: 5.1 mEq/L (ref 3.5–5.1)
SODIUM: 140 meq/L (ref 136–145)
TOTAL PROTEIN: 7.6 g/dL (ref 6.4–8.3)

## 2014-09-15 LAB — UA PROTEIN, DIPSTICK - CHCC: Protein, ur: NEGATIVE mg/dL

## 2014-09-15 MED ORDER — SODIUM CHLORIDE 0.9 % IJ SOLN
10.0000 mL | INTRAMUSCULAR | Status: DC | PRN
Start: 2014-09-15 — End: 2014-09-15
  Administered 2014-09-15: 10 mL
  Filled 2014-09-15: qty 10

## 2014-09-15 MED ORDER — SODIUM CHLORIDE 0.9 % IV SOLN
14.5000 mg/kg | Freq: Once | INTRAVENOUS | Status: AC
  Administered 2014-09-15: 1100 mg via INTRAVENOUS
  Filled 2014-09-15: qty 44

## 2014-09-15 MED ORDER — HEPARIN SOD (PORK) LOCK FLUSH 100 UNIT/ML IV SOLN
500.0000 [IU] | Freq: Once | INTRAVENOUS | Status: AC | PRN
  Administered 2014-09-15: 500 [IU]
  Filled 2014-09-15: qty 5

## 2014-09-15 MED ORDER — SODIUM CHLORIDE 0.9 % IV SOLN
Freq: Once | INTRAVENOUS | Status: AC
  Administered 2014-09-15: 13:00:00 via INTRAVENOUS

## 2014-09-15 NOTE — Progress Notes (Signed)
  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 This was diagnosed in July of 2014.   Prior Therapy:  She is status post paracentesis performed on 09/17/2012 with the cytology confirming the presence of adenocarcinoma and immunohistochemical stains suggest GYN etiology. She is also status post lumpectomy for breast cancer diagnosed 25 years ago followed by radiation and chemotherapy under the care of Dr. Sonny Dandy. She did not receive any hormonal therapy. Chemotherapy utilizing carboplatin and Taxotere started on 10/01/2012. Avastin was added with cycle 4. This was discontinued in June of 2015.  Current therapy: She is currently on Avastin maintenance only after chemotherapy was discontinued in June of 2015.     INTERVAL HISTORY:   Ms. Coltrane returns as scheduled. She overall feels well. She reports history of a remote ankle injury. She recently reinjured the ankle. She continues to have intermittent nausea. No abdominal pain. No shortness of breath or chest pain. No leg swelling or calf pain. Overall good appetite. She continues to have intermittent rectal bleeding. She was recently seen by gastroenterology. A colonoscopy is planned.  Objective:  Vital signs in last 24 hours:  Blood pressure 138/74, pulse 69, temperature 98 F (36.7 C), temperature source Oral, resp. rate 18, height 5\' 2"  (1.575 m), weight 165 lb 14.4 oz (75.252 kg), SpO2 99 %.    HEENT: No thrush or ulcers. Lymphatics: No palpable cervical or supraclavicular lymph nodes. Resp: Lungs clear bilaterally. Cardio: Regular rate and rhythm. GI: Abdomen soft and nontender. No hepatomegaly. Vascular: No leg edema. Calves soft and nontender. Neuro: Alert and oriented.  Skin: No rash. Port-A-Cath without erythema.    Lab Results:  Lab Results  Component Value Date     WBC 3.7* 09/15/2014   HGB 11.8 09/15/2014   HCT 37.1 09/15/2014   MCV 93.5 09/15/2014   PLT 74* 09/15/2014   NEUTROABS 1.8 09/15/2014    Imaging:  No results found.  Medications: I have reviewed the patient's current medications.  Assessment/Plan: 1. Peritoneal carcinomatosis with omental involvement. The pathology confirmed the presence of adenocarcinoma of likely GYN etiology. Her CA 125 was elevated at 333.5 on 09/19/2012. She is status post systemic chemotherapy with an excellent response utilizing carboplatin and Taxotere and Avastin. This therapy was held in June of 2015 and she is currently on Avastin maintenance.   CT scan obtained on 08/24/2014 continues to show stable disease. Her tumor marker has been stable. Repeat imaging is planned at a four-month interval.   2. IV access: She had a Port-A-Cath inserted without any complications.  3. Thrombocytopenia: Platelet count remains stable.  5. Nausea, stable.   6. Rectal bleeding. Status post GI evaluation. Colonoscopy planned.    Disposition: Ms. Dubiel appears stable. Plan to continue every 3 week Avastin. We will see her in follow-up in 3 weeks. She will contact the office in the interim with any problems.    Ned Card ANP/GNP-BC   09/15/2014  11:52 AM

## 2014-09-15 NOTE — Patient Instructions (Signed)
[facility] Discharge Instructions for Patients Receiving Chemotherapy  Today you received the following chemotherapy agents: Avastin  To help prevent nausea and vomiting after your treatment, we encourage you to take your nausea medication as prescribed by your physician.   If you develop nausea and vomiting that is not controlled by your nausea medication, call the clinic.   BELOW ARE SYMPTOMS THAT SHOULD BE REPORTED IMMEDIATELY:  *FEVER GREATER THAN 100.5 F  *CHILLS WITH OR WITHOUT FEVER  NAUSEA AND VOMITING THAT IS NOT CONTROLLED WITH YOUR NAUSEA MEDICATION  *UNUSUAL SHORTNESS OF BREATH  *UNUSUAL BRUISING OR BLEEDING  TENDERNESS IN MOUTH AND THROAT WITH OR WITHOUT PRESENCE OF ULCERS  *URINARY PROBLEMS  *BOWEL PROBLEMS  UNUSUAL RASH Items with * indicate a potential emergency and should be followed up as soon as possible.  Feel free to call the clinic you have any questions or concerns. The clinic phone number is [phone].  Please show the [facility] at check-in to the Emergency Department and triage nurse.

## 2014-09-16 ENCOUNTER — Encounter: Payer: Self-pay | Admitting: Gastroenterology

## 2014-09-16 LAB — CA 125: CA 125: 53 U/mL — ABNORMAL HIGH (ref <35)

## 2014-09-21 ENCOUNTER — Telehealth: Payer: Self-pay | Admitting: *Deleted

## 2014-09-21 ENCOUNTER — Encounter: Payer: Self-pay | Admitting: Oncology

## 2014-09-21 NOTE — Telephone Encounter (Signed)
Lm on answering machine, i would try to reach patient tomorrow

## 2014-09-24 ENCOUNTER — Other Ambulatory Visit: Payer: Self-pay | Admitting: Oncology

## 2014-09-29 ENCOUNTER — Encounter: Payer: Self-pay | Admitting: Oncology

## 2014-09-29 NOTE — Progress Notes (Signed)
Returned pt's call.  She stated someone called her regarding joining the [facility] but she couldn't remember the person's name or the number they called from her.  I informed her that I couldn't assist her with the [facility] and without a person's name or number I didn't know who to refer her to.  She verbalized understanding.

## 2014-10-01 ENCOUNTER — Encounter: Payer: BLUE CROSS/BLUE SHIELD | Admitting: Internal Medicine

## 2014-10-06 ENCOUNTER — Ambulatory Visit: Payer: BLUE CROSS/BLUE SHIELD

## 2014-10-06 ENCOUNTER — Other Ambulatory Visit: Payer: BLUE CROSS/BLUE SHIELD

## 2014-10-06 ENCOUNTER — Ambulatory Visit: Payer: BLUE CROSS/BLUE SHIELD | Admitting: Oncology

## 2014-10-08 ENCOUNTER — Ambulatory Visit (HOSPITAL_BASED_OUTPATIENT_CLINIC_OR_DEPARTMENT_OTHER): Payer: BLUE CROSS/BLUE SHIELD

## 2014-10-08 ENCOUNTER — Telehealth: Payer: Self-pay | Admitting: Oncology

## 2014-10-08 ENCOUNTER — Other Ambulatory Visit (HOSPITAL_BASED_OUTPATIENT_CLINIC_OR_DEPARTMENT_OTHER): Payer: BLUE CROSS/BLUE SHIELD

## 2014-10-08 ENCOUNTER — Ambulatory Visit (HOSPITAL_BASED_OUTPATIENT_CLINIC_OR_DEPARTMENT_OTHER): Payer: BLUE CROSS/BLUE SHIELD | Admitting: Oncology

## 2014-10-08 VITALS — BP 139/82 | HR 67

## 2014-10-08 VITALS — BP 143/67 | HR 69 | Temp 98.2°F | Resp 18 | Ht 62.0 in | Wt 165.3 lb

## 2014-10-08 DIAGNOSIS — C801 Malignant (primary) neoplasm, unspecified: Secondary | ICD-10-CM | POA: Diagnosis not present

## 2014-10-08 DIAGNOSIS — C569 Malignant neoplasm of unspecified ovary: Secondary | ICD-10-CM

## 2014-10-08 DIAGNOSIS — C786 Secondary malignant neoplasm of retroperitoneum and peritoneum: Secondary | ICD-10-CM

## 2014-10-08 DIAGNOSIS — Z5112 Encounter for antineoplastic immunotherapy: Secondary | ICD-10-CM | POA: Diagnosis not present

## 2014-10-08 DIAGNOSIS — D696 Thrombocytopenia, unspecified: Secondary | ICD-10-CM

## 2014-10-08 LAB — CBC WITH DIFFERENTIAL/PLATELET
BASO%: 0.3 % (ref 0.0–2.0)
BASOS ABS: 0 10*3/uL (ref 0.0–0.1)
EOS%: 3.8 % (ref 0.0–7.0)
Eosinophils Absolute: 0.1 10*3/uL (ref 0.0–0.5)
HCT: 39.7 % (ref 34.8–46.6)
HGB: 12.4 g/dL (ref 11.6–15.9)
LYMPH%: 42.6 % (ref 14.0–49.7)
MCH: 30.4 pg (ref 25.1–34.0)
MCHC: 31.2 g/dL — ABNORMAL LOW (ref 31.5–36.0)
MCV: 97.3 fL (ref 79.5–101.0)
MONO#: 0.3 10*3/uL (ref 0.1–0.9)
MONO%: 9.9 % (ref 0.0–14.0)
NEUT#: 1.5 10*3/uL (ref 1.5–6.5)
NEUT%: 43.4 % (ref 38.4–76.8)
Platelets: 65 10*3/uL — ABNORMAL LOW (ref 145–400)
RBC: 4.08 10*6/uL (ref 3.70–5.45)
RDW: 14.8 % — ABNORMAL HIGH (ref 11.2–14.5)
WBC: 3.4 10*3/uL — ABNORMAL LOW (ref 3.9–10.3)
lymph#: 1.5 10*3/uL (ref 0.9–3.3)

## 2014-10-08 LAB — COMPREHENSIVE METABOLIC PANEL (CC13)
ALT: 15 U/L (ref 0–55)
AST: 20 U/L (ref 5–34)
Albumin: 3.4 g/dL — ABNORMAL LOW (ref 3.5–5.0)
Alkaline Phosphatase: 66 U/L (ref 40–150)
Anion Gap: 9 mEq/L (ref 3–11)
BUN: 13.5 mg/dL (ref 7.0–26.0)
CALCIUM: 10 mg/dL (ref 8.4–10.4)
CHLORIDE: 107 meq/L (ref 98–109)
CO2: 26 mEq/L (ref 22–29)
Creatinine: 0.8 mg/dL (ref 0.6–1.1)
EGFR: 79 mL/min/{1.73_m2} — ABNORMAL LOW (ref >90)
Glucose: 94 mg/dl (ref 70–140)
Potassium: 4.2 mEq/L (ref 3.5–5.1)
Sodium: 142 mEq/L (ref 136–145)
Total Bilirubin: 0.25 mg/dL (ref 0.20–1.20)
Total Protein: 7.5 g/dL (ref 6.4–8.3)

## 2014-10-08 LAB — CA 125: CA 125: 58 U/mL — AB (ref <35)

## 2014-10-08 MED ORDER — SODIUM CHLORIDE 0.9 % IV SOLN
14.5000 mg/kg | Freq: Once | INTRAVENOUS | Status: AC
  Administered 2014-10-08: 1100 mg via INTRAVENOUS
  Filled 2014-10-08: qty 44

## 2014-10-08 MED ORDER — SODIUM CHLORIDE 0.9 % IV SOLN
Freq: Once | INTRAVENOUS | Status: AC
  Administered 2014-10-08: 10:00:00 via INTRAVENOUS

## 2014-10-08 MED ORDER — SODIUM CHLORIDE 0.9 % IJ SOLN
10.0000 mL | INTRAMUSCULAR | Status: DC | PRN
  Administered 2014-10-08: 10 mL
  Filled 2014-10-08: qty 10

## 2014-10-08 MED ORDER — LOPERAMIDE HCL 2 MG PO CAPS
ORAL_CAPSULE | ORAL | Status: AC
  Filled 2014-10-08: qty 1

## 2014-10-08 MED ORDER — HEPARIN SOD (PORK) LOCK FLUSH 100 UNIT/ML IV SOLN
500.0000 [IU] | Freq: Once | INTRAVENOUS | Status: AC | PRN
  Administered 2014-10-08: 500 [IU]
  Filled 2014-10-08: qty 5

## 2014-10-08 MED ORDER — LOPERAMIDE HCL 2 MG PO CAPS
2.0000 mg | ORAL_CAPSULE | Freq: Once | ORAL | Status: AC
  Administered 2014-10-08: 2 mg via ORAL

## 2014-10-08 NOTE — Telephone Encounter (Signed)
Gave and printed appt sched and avs for pt for Sept °

## 2014-10-08 NOTE — Patient Instructions (Signed)
[facility] Discharge Instructions for Patients Receiving Chemotherapy  Today you received the following chemotherapy agents: avastin   To help prevent nausea and vomiting after your treatment, we encourage you to take your nausea medication.  Take it as often as prescribed.     If you develop nausea and vomiting that is not controlled by your nausea medication, call the clinic. If it is after clinic hours your family physician or the after hours number for the clinic or go to the Emergency Department.   BELOW ARE SYMPTOMS THAT SHOULD BE REPORTED IMMEDIATELY:  *FEVER GREATER THAN 100.5 F  *CHILLS WITH OR WITHOUT FEVER  NAUSEA AND VOMITING THAT IS NOT CONTROLLED WITH YOUR NAUSEA MEDICATION  *UNUSUAL SHORTNESS OF BREATH  *UNUSUAL BRUISING OR BLEEDING  TENDERNESS IN MOUTH AND THROAT WITH OR WITHOUT PRESENCE OF ULCERS  *URINARY PROBLEMS  *BOWEL PROBLEMS  UNUSUAL RASH Items with * indicate a potential emergency and should be followed up as soon as possible.  Feel free to call the clinic you have any questions or concerns. The clinic phone number is [phone].   I have been informed and understand all the instructions given to me. I know to contact the clinic, my physician, or go to the Emergency Department if any problems should occur. I do not have any questions at this time, but understand that I may call the clinic during office hours   should I have any questions or need assistance in obtaining follow up care.    __________________________________________  _____________  __________ Signature of Patient or Authorized Representative            Date                   Time    __________________________________________ Nurse's Signature    

## 2014-10-08 NOTE — Progress Notes (Signed)
Hematology and Oncology Follow Up Visit  [name] [id number] 1955-05-03 59 y.o. 10/08/2014 9:19 AM    Principle Diagnosis: 59 year old woman diagnosed with peritoneal carcinomatosis and ascites that is biopsy proven to be adenocarcinoma  of a GYN etiology. This was diagnosed in July of 2014.   Prior Therapy:  She is status post paracentesis performed on 09/17/2012 with the cytology confirmed the presence of adenocarcinoma and immunohistochemical stains suggest GYN etiology. She is also status post lumpectomy for breast cancer diagnosed 25 years ago followed by radiation and chemotherapy under the care of Dr. Sonny Dandy. She did not receive any hormonal therapy. Chemotherapy utilizing carboplatin and Taxotere started on 10/01/2012. Avastin was added with cycle 4. This was discontinued in June of 2015.  Current therapy:  She is currently on a Avastin maintenance only after chemotherapy was discontinued in June of 2015.  Interim History: [name] presents today for a followup visit with her husband. Since her last visit, she continues to do relatively well. She has reported some occasional abdominal cramps and diarrhea but not persistently. She has tolerated Avastin without any new complications. She has not reported any infusion-related complications. She continues to perform activities of daily living without any decline. She has not reported any abdominal distention or early satiety. Her quality of life remains excellent. Her mood continue to be stable without depression or anxiety.  She denied any fever chills. Has not reported any shortness of breath or cough. She denied any pain with swallowing.She denied fever and chills.  She has reported also grade 1 neuropathy which remains stable.  She has not any shortness of breath or chest pain. She denied any GYN bleeding. She has not reported any increase in her abdominal pain or early satiety. She also reports no hematochezia or melena. She has not  reported any syncope alteration of mental status. Does not report any abdominal distention. Remainder of the review of systems is unremarkable.  Medications: I have reviewed the patient's current medications.  Current Outpatient Prescriptions  Medication Sig Dispense Refill  . ALPRAZolam (XANAX) 1 MG tablet Take 1 mg by mouth at bedtime as needed for anxiety.    . betamethasone dipropionate (DIPROLENE) 0.05 % cream Apply topically 2 (two) times daily. 30 g 0  . calcium carbonate (TUMS - DOSED IN MG ELEMENTAL CALCIUM) 500 MG chewable tablet Chew 1 tablet by mouth as needed for indigestion or heartburn.    . divalproex (DEPAKOTE ER) 500 MG 24 hr tablet Take 1,500 mg by mouth at bedtime.     . gabapentin (NEURONTIN) 100 MG capsule TAKE ONE CAPSULE BY MOUTH THREE TIMES A DAY 90 capsule 1  . gabapentin (NEURONTIN) 100 MG capsule TAKE ONE CAPSULE BY MOUTH THREE TIMES A DAY 90 capsule 1  . lamoTRIgine (LAMICTAL) 200 MG tablet Take 200 mg by mouth at bedtime.  1  . levothyroxine (SYNTHROID, LEVOTHROID) 100 MCG tablet Take 100 mcg by mouth daily before breakfast.     . lidocaine-prilocaine (EMLA) cream Apply topically as needed. Apply to port with every chemotherapy. 30 g 1  . loperamide (IMODIUM A-D) 2 MG tablet Take 2 mg by mouth as needed for diarrhea or loose stools.    . Na Sulfate-K Sulfate-Mg Sulf SOLN Please take as directed per colonoscopy instructions 354 mL 0  . OLANZapine-FLUoxetine (SYMBYAX) 6-25 MG per capsule     . omeprazole (PRILOSEC) 10 MG capsule Take 10 mg by mouth daily.    . ondansetron (ZOFRAN ODT) 8 MG disintegrating  tablet Take 1 tablet (8 mg total) by mouth every 8 (eight) hours as needed for nausea or vomiting. 20 tablet 0  . ondansetron (ZOFRAN) 8 MG tablet TAKE 1 TABLET BY MOUTH EVERY 8 HOURS AS NEEDED FOR NAUSEA AND VOMITING 20 tablet 1  . rOPINIRole (REQUIP) 1 MG tablet Take 1 mg by mouth at bedtime.     No current facility-administered medications for this visit.      Allergies: No Known Allergies  Past Medical History, Surgical history, Social history, and Family History were reviewed and updated.   Physical Exam: Blood pressure 143/67, pulse 69, temperature 98.2 F (36.8 C), temperature source Oral, resp. rate 18, height 5\' 2"  (1.575 m), weight 165 lb 4.8 oz (74.98 kg), SpO2 99 %.  ECOG: 1 General appearance: awake alert pleasant woman without any active distress. Head: Normocephalic, without obvious abnormality. Her sclerae was anicteric. Neck: no adenopathy Lymph nodes: Cervical, supraclavicular, and axillary nodes normal. Heart:regular rate and rhythm, S1, S2 normal, no murmur, click, rub or gallop Lung:chest clear, no wheezing, rales, normal symmetric air entry Abdomin: Soft, nontender with good bowel sounds. No distention or shifting dullness. EXT:no erythema, induration, or nodules. . Skin: No rashes or lesions.  Lab Results: Lab Results  Component Value Date   WBC 3.4* 10/08/2014   HGB 12.4 10/08/2014   HCT 39.7 10/08/2014   MCV 97.3 10/08/2014   PLT 65* 10/08/2014     Chemistry      Component Value Date/Time   NA 142 10/08/2014 0826   NA 143 06/03/2014 1142   K 4.2 10/08/2014 0826   K 4.3 06/03/2014 1142   CL 107 06/03/2014 1142   CO2 26 10/08/2014 0826   CO2 29 06/03/2014 1142   BUN 13.5 10/08/2014 0826   BUN 15 06/03/2014 1142   CREATININE 0.8 10/08/2014 0826   CREATININE 0.74 06/03/2014 1142      Component Value Date/Time   CALCIUM 10.0 10/08/2014 0826   CALCIUM 8.8 06/03/2014 1142   ALKPHOS 66 10/08/2014 0826   ALKPHOS 57 06/03/2014 1142   AST 20 10/08/2014 0826   AST 21 06/03/2014 1142   ALT 15 10/08/2014 0826   ALT 13 06/03/2014 1142   BILITOT 0.25 10/08/2014 0826   BILITOT 0.2* 06/03/2014 1142     Results for [name], [name] (MRN [id number]) as of 08/25/2014 09:16  Ref. Range 07/15/2014 08:34 08/05/2014 08:42 08/24/2014 08:37  CA 125 Latest Ref Range: <35 U/mL 57 (H) 59 (H) 53 (H)            Impression and Plan:  59 year old woman with the following issues:  1. Peritoneal carcinomatosis with omental involvement. The pathology confirmed the presence of adenocarcinoma of likely GYN etiology. Her CA 125 was elevated at 333.5 on 09/19/2012. She is status post systemic chemotherapy with an excellent response utilizing carboplatin and Taxotere and a Avastin . This therapy was held in June of 2015 and currently on Avastin maintenance.   CT scan obtained on 08/24/2014 continues to show stable disease. Her tumor marker continues to be relatively stable. She has tolerated this therapy very well without any complications.   The plan is to continue with the same regimen for the time being and switch her to aggressive chemotherapy upon symptomatic progression. No delayed complications noted from a Avastin.  2. IV access: She had a Port-A-Cath inserted without any complications.  3. Thrombocytopenia: Her platelets continues to fluctuate without any persistent bleeding. She does not require any growth factor support and  transfusions.  4. Weight gain: Appear to be stable at this time. No evidence of ascites or fluid retention.  5. Nausea/diarrhea: Seems to be manageable at this time.  6. The followup: She will followup in 3 weeks for her next cycle of a Avastin.    [facility], MD 8/19/20169:19 AM

## 2014-10-29 ENCOUNTER — Telehealth: Payer: Self-pay | Admitting: Oncology

## 2014-10-29 ENCOUNTER — Other Ambulatory Visit (HOSPITAL_BASED_OUTPATIENT_CLINIC_OR_DEPARTMENT_OTHER): Payer: BLUE CROSS/BLUE SHIELD

## 2014-10-29 ENCOUNTER — Ambulatory Visit (HOSPITAL_BASED_OUTPATIENT_CLINIC_OR_DEPARTMENT_OTHER): Payer: BLUE CROSS/BLUE SHIELD

## 2014-10-29 ENCOUNTER — Ambulatory Visit (HOSPITAL_BASED_OUTPATIENT_CLINIC_OR_DEPARTMENT_OTHER): Payer: BLUE CROSS/BLUE SHIELD | Admitting: Oncology

## 2014-10-29 VITALS — BP 112/76 | HR 81 | Temp 98.0°F | Resp 18 | Ht 62.0 in | Wt 163.4 lb

## 2014-10-29 DIAGNOSIS — C569 Malignant neoplasm of unspecified ovary: Secondary | ICD-10-CM

## 2014-10-29 DIAGNOSIS — Z5112 Encounter for antineoplastic immunotherapy: Secondary | ICD-10-CM | POA: Diagnosis not present

## 2014-10-29 DIAGNOSIS — C786 Secondary malignant neoplasm of retroperitoneum and peritoneum: Secondary | ICD-10-CM | POA: Diagnosis not present

## 2014-10-29 DIAGNOSIS — C801 Malignant (primary) neoplasm, unspecified: Secondary | ICD-10-CM | POA: Diagnosis not present

## 2014-10-29 LAB — COMPREHENSIVE METABOLIC PANEL (CC13)
ALT: 13 U/L (ref 0–55)
AST: 24 U/L (ref 5–34)
Albumin: 3.5 g/dL (ref 3.5–5.0)
Alkaline Phosphatase: 64 U/L (ref 40–150)
Anion Gap: 6 mEq/L (ref 3–11)
BUN: 19 mg/dL (ref 7.0–26.0)
CHLORIDE: 108 meq/L (ref 98–109)
CO2: 28 meq/L (ref 22–29)
CREATININE: 0.9 mg/dL (ref 0.6–1.1)
Calcium: 9.7 mg/dL (ref 8.4–10.4)
EGFR: 71 mL/min/{1.73_m2} — ABNORMAL LOW (ref >90)
Glucose: 81 mg/dl (ref 70–140)
POTASSIUM: 4.8 meq/L (ref 3.5–5.1)
Sodium: 142 mEq/L (ref 136–145)
Total Bilirubin: 0.52 mg/dL (ref 0.20–1.20)
Total Protein: 7.7 g/dL (ref 6.4–8.3)

## 2014-10-29 LAB — CBC WITH DIFFERENTIAL/PLATELET
BASO%: 0.4 % (ref 0.0–2.0)
BASOS ABS: 0 10*3/uL (ref 0.0–0.1)
EOS ABS: 0.2 10*3/uL (ref 0.0–0.5)
EOS%: 4.8 % (ref 0.0–7.0)
HCT: 39.2 % (ref 34.8–46.6)
HEMOGLOBIN: 12.2 g/dL (ref 11.6–15.9)
LYMPH%: 37 % (ref 14.0–49.7)
MCH: 29.2 pg (ref 25.1–34.0)
MCHC: 31.1 g/dL — ABNORMAL LOW (ref 31.5–36.0)
MCV: 93.9 fL (ref 79.5–101.0)
MONO#: 0.4 10*3/uL (ref 0.1–0.9)
MONO%: 12.4 % (ref 0.0–14.0)
NEUT%: 45.4 % (ref 38.4–76.8)
NEUTROS ABS: 1.6 10*3/uL (ref 1.5–6.5)
PLATELETS: 80 10*3/uL — AB (ref 145–400)
RBC: 4.18 10*6/uL (ref 3.70–5.45)
RDW: 15.1 % — AB (ref 11.2–14.5)
WBC: 3.6 10*3/uL — ABNORMAL LOW (ref 3.9–10.3)
lymph#: 1.3 10*3/uL (ref 0.9–3.3)

## 2014-10-29 MED ORDER — SODIUM CHLORIDE 0.9 % IJ SOLN
10.0000 mL | INTRAMUSCULAR | Status: DC | PRN
  Administered 2014-10-29: 10 mL
  Filled 2014-10-29: qty 10

## 2014-10-29 MED ORDER — SODIUM CHLORIDE 0.9 % IV SOLN
14.4000 mg/kg | Freq: Once | INTRAVENOUS | Status: AC
  Administered 2014-10-29: 1100 mg via INTRAVENOUS
  Filled 2014-10-29: qty 44

## 2014-10-29 MED ORDER — HEPARIN SOD (PORK) LOCK FLUSH 100 UNIT/ML IV SOLN
500.0000 [IU] | Freq: Once | INTRAVENOUS | Status: AC | PRN
  Administered 2014-10-29: 500 [IU]
  Filled 2014-10-29: qty 5

## 2014-10-29 MED ORDER — SODIUM CHLORIDE 0.9 % IV SOLN
Freq: Once | INTRAVENOUS | Status: AC
  Administered 2014-10-29: 14:00:00 via INTRAVENOUS

## 2014-10-29 NOTE — Telephone Encounter (Signed)
Gave patient avs report and appointments for September and October  °

## 2014-10-29 NOTE — Progress Notes (Signed)
Hematology and Oncology Follow Up Visit  AIDE WOJNAR [id number] [birth year] 59 y.o. 10/29/2014 1:18 PM    Principle Diagnosis: 59 year old woman diagnosed with peritoneal carcinomatosis and ascites that is biopsy proven to be adenocarcinoma  of a GYN etiology. This was diagnosed in July of 2014.   Prior Therapy:  She is status post paracentesis performed on 09/17/2012 with the cytology confirmed the presence of adenocarcinoma and immunohistochemical stains suggest GYN etiology. She is also status post lumpectomy for breast cancer diagnosed 25 years ago followed by radiation and chemotherapy under the care of Dr. Sonny Dandy. She did not receive any hormonal therapy. Chemotherapy utilizing carboplatin and Taxotere started on 10/01/2012. Avastin was added with cycle 4. This was discontinued in June of 2015.  Current therapy:  She is currently on a Avastin maintenance only after chemotherapy was discontinued in June of 2015.  Interim History: Mrs. Comp presents today for a followup visit with her husband. Since her last visit, she continues to report intermittent hematochezia. She is currently under evaluation by gastroenterology and scheduled to have a colonoscopy a few weeks. She has reported some occasional abdominal cramps and diarrhea but not persistently. She has tolerated Avastin without any new complications. She has not reported any infusion-related complications. She has not reported increase in her blood pressure or bleeding. She continues to perform activities of daily living without any decline. She has not reported any abdominal distention or early satiety. Her quality of life remains excellent.   She denied any fever chills. Has not reported any shortness of breath or cough. She denied any pain with swallowing.She denied fever and chills.  She has reported also grade 1 neuropathy which remains stable.  She has not any shortness of breath or chest pain. She denied any GYN bleeding. She has  not reported any increase in her abdominal pain or early satiety. She also reports no hematochezia or melena. She has not reported any syncope alteration of mental status. Does not report any abdominal distention. Her mood is excellent without any depression or anxiety. Remainder of the review of systems is unremarkable.  Medications: I have reviewed the patient's current medications.  Current Outpatient Prescriptions  Medication Sig Dispense Refill  . ALPRAZolam (XANAX) 1 MG tablet Take 1 mg by mouth at bedtime as needed for anxiety.    . betamethasone dipropionate (DIPROLENE) 0.05 % cream Apply topically 2 (two) times daily. 30 g 0  . calcium carbonate (TUMS - DOSED IN MG ELEMENTAL CALCIUM) 500 MG chewable tablet Chew 1 tablet by mouth as needed for indigestion or heartburn.    . divalproex (DEPAKOTE ER) 500 MG 24 hr tablet Take 1,500 mg by mouth at bedtime.     . gabapentin (NEURONTIN) 100 MG capsule TAKE ONE CAPSULE BY MOUTH THREE TIMES A DAY 90 capsule 1  . gabapentin (NEURONTIN) 100 MG capsule TAKE ONE CAPSULE BY MOUTH THREE TIMES A DAY 90 capsule 1  . lamoTRIgine (LAMICTAL) 200 MG tablet Take 200 mg by mouth at bedtime.  1  . levothyroxine (SYNTHROID, LEVOTHROID) 100 MCG tablet Take 100 mcg by mouth daily before breakfast.     . lidocaine-prilocaine (EMLA) cream Apply topically as needed. Apply to port with every chemotherapy. 30 g 1  . loperamide (IMODIUM A-D) 2 MG tablet Take 2 mg by mouth as needed for diarrhea or loose stools.    . Na Sulfate-K Sulfate-Mg Sulf SOLN Please take as directed per colonoscopy instructions 354 mL 0  . OLANZapine-FLUoxetine (SYMBYAX) 6-25 MG per  capsule     . omeprazole (PRILOSEC) 10 MG capsule Take 10 mg by mouth daily.    . ondansetron (ZOFRAN ODT) 8 MG disintegrating tablet Take 1 tablet (8 mg total) by mouth every 8 (eight) hours as needed for nausea or vomiting. 20 tablet 0  . ondansetron (ZOFRAN) 8 MG tablet TAKE 1 TABLET BY MOUTH EVERY 8 HOURS AS NEEDED  FOR NAUSEA AND VOMITING 20 tablet 1  . rOPINIRole (REQUIP) 1 MG tablet Take 1 mg by mouth at bedtime.     No current facility-administered medications for this visit.     Allergies: No Known Allergies  Past Medical History, Surgical history, Social history, and Family History were reviewed and updated.   Physical Exam: Blood pressure 112/76, pulse 81, temperature 98 F (36.7 C), temperature source Oral, resp. rate 18, height 5\' 2"  (1.575 m), weight 163 lb 6.4 oz (74.118 kg), SpO2 97 %.  ECOG: 1 General appearance: awake alert pleasant woman without any active distress. Appropriate affect. Head: Normocephalic, without obvious abnormality. Her sclerae was anicteric. Oral mucosa is moist and pink without ulcers. Neck: no adenopathy Lymph nodes: Cervical, supraclavicular, and axillary nodes normal. Heart:regular rate and rhythm, S1, S2 normal, no murmur, click, rub or gallop Lung:chest clear, no wheezing, rales, normal symmetric air entry Abdomin: Soft, nontender with good bowel sounds. No distention or shifting dullness. EXT:no erythema, induration, or nodules. . Skin: No rashes or lesions.  Lab Results: Lab Results  Component Value Date   WBC 3.6* 10/29/2014   HGB 12.2 10/29/2014   HCT 39.2 10/29/2014   MCV 93.9 10/29/2014   PLT 80* 10/29/2014     Chemistry      Component Value Date/Time   NA 142 10/08/2014 0826   NA 143 06/03/2014 1142   K 4.2 10/08/2014 0826   K 4.3 06/03/2014 1142   CL 107 06/03/2014 1142   CO2 26 10/08/2014 0826   CO2 29 06/03/2014 1142   BUN 13.5 10/08/2014 0826   BUN 15 06/03/2014 1142   CREATININE 0.8 10/08/2014 0826   CREATININE 0.74 06/03/2014 1142      Component Value Date/Time   CALCIUM 10.0 10/08/2014 0826   CALCIUM 8.8 06/03/2014 1142   ALKPHOS 66 10/08/2014 0826   ALKPHOS 57 06/03/2014 1142   AST 20 10/08/2014 0826   AST 21 06/03/2014 1142   ALT 15 10/08/2014 0826   ALT 13 06/03/2014 1142   BILITOT 0.25 10/08/2014 0826    BILITOT 0.2* 06/03/2014 1142         Results for KENESHIA, TENA (MRN [id number]) as of 10/29/2014 13:04  Ref. Range 08/24/2014 08:37 09/15/2014 11:07 10/08/2014 08:26  CA 125 Latest Ref Range: <35 U/mL 53 (H) 53 (H) 58 (H)        Impression and Plan:  59 year old woman with the following issues:  1. Peritoneal carcinomatosis with omental involvement. The pathology confirmed the presence of adenocarcinoma of likely GYN etiology. Her CA 125 was elevated at 333.5 on 09/19/2012. She is status post systemic chemotherapy with an excellent response utilizing carboplatin and Taxotere and a Avastin . This therapy was held in June of 2015 and currently on Avastin maintenance.   CT scan obtained on 08/24/2014 continues to show stable disease. Her tumor marker continues to be relatively stable. She has not reported any complication related to a Avastin therapy.  The plan is to continue with the same regimen for the time being and switch her to aggressive chemotherapy upon symptomatic progression. The plan  is to repeat imaging studies in 2 months from now around November 2016.  2. IV access: She had a Port-A-Cath inserted without any complications.  3. Thrombocytopenia: Her platelets are slightly improved today without any evidence of bleeding.  4. Weight gain: Appear to be stable at this time. No evidence of ascites or fluid retention.  5. Nausea/diarrhea: Seems to be manageable at this time.  6. Hematochezia: Continues to be intermittent in nature and likely related to hemorrhoids. She has a colonoscopy scheduled in any future.  7. The followup: She will followup in 3 weeks for her next cycle of a Avastin.    Zola Button, MD 9/9/20161:18 PM

## 2014-10-30 LAB — CA 125: CA 125: 55 U/mL — AB (ref <35)

## 2014-11-04 ENCOUNTER — Ambulatory Visit (AMBULATORY_SURGERY_CENTER): Payer: Self-pay

## 2014-11-04 VITALS — Ht 62.0 in | Wt 162.8 lb

## 2014-11-04 DIAGNOSIS — K921 Melena: Secondary | ICD-10-CM

## 2014-11-04 MED ORDER — NA SULFATE-K SULFATE-MG SULF 17.5-3.13-1.6 GM/177ML PO SOLN: ORAL | Status: DC

## 2014-11-04 NOTE — Progress Notes (Signed)
Per pt, no allergies to soy or egg products.Pt not taking any weight loss meds or using  O2 at home. 

## 2014-11-19 ENCOUNTER — Ambulatory Visit (HOSPITAL_BASED_OUTPATIENT_CLINIC_OR_DEPARTMENT_OTHER): Payer: BLUE CROSS/BLUE SHIELD | Admitting: Oncology

## 2014-11-19 ENCOUNTER — Encounter (HOSPITAL_BASED_OUTPATIENT_CLINIC_OR_DEPARTMENT_OTHER): Payer: BLUE CROSS/BLUE SHIELD | Admitting: Oncology

## 2014-11-19 ENCOUNTER — Ambulatory Visit (HOSPITAL_BASED_OUTPATIENT_CLINIC_OR_DEPARTMENT_OTHER): Payer: BLUE CROSS/BLUE SHIELD

## 2014-11-19 ENCOUNTER — Telehealth: Payer: Self-pay | Admitting: Oncology

## 2014-11-19 VITALS — BP 144/76 | HR 80 | Temp 98.0°F | Resp 18 | Ht 62.0 in | Wt 161.8 lb

## 2014-11-19 VITALS — BP 143/82 | HR 80 | Resp 16

## 2014-11-19 DIAGNOSIS — C801 Malignant (primary) neoplasm, unspecified: Secondary | ICD-10-CM

## 2014-11-19 DIAGNOSIS — D696 Thrombocytopenia, unspecified: Secondary | ICD-10-CM | POA: Diagnosis not present

## 2014-11-19 DIAGNOSIS — C569 Malignant neoplasm of unspecified ovary: Secondary | ICD-10-CM

## 2014-11-19 DIAGNOSIS — C786 Secondary malignant neoplasm of retroperitoneum and peritoneum: Secondary | ICD-10-CM

## 2014-11-19 DIAGNOSIS — Z5112 Encounter for antineoplastic immunotherapy: Secondary | ICD-10-CM

## 2014-11-19 LAB — CBC WITH DIFFERENTIAL/PLATELET
BASO%: 0.3 % (ref 0.0–2.0)
BASOS ABS: 0 10*3/uL (ref 0.0–0.1)
EOS ABS: 0.2 10*3/uL (ref 0.0–0.5)
EOS%: 5.1 % (ref 0.0–7.0)
HCT: 38.2 % (ref 34.8–46.6)
HEMOGLOBIN: 11.8 g/dL (ref 11.6–15.9)
LYMPH%: 38.4 % (ref 14.0–49.7)
MCH: 30 pg (ref 25.1–34.0)
MCHC: 30.9 g/dL — ABNORMAL LOW (ref 31.5–36.0)
MCV: 97.2 fL (ref 79.5–101.0)
MONO#: 0.4 10*3/uL (ref 0.1–0.9)
MONO%: 9.7 % (ref 0.0–14.0)
NEUT%: 46.5 % (ref 38.4–76.8)
NEUTROS ABS: 1.7 10*3/uL (ref 1.5–6.5)
PLATELETS: 66 10*3/uL — AB (ref 145–400)
RBC: 3.93 10*6/uL (ref 3.70–5.45)
RDW: 14.9 % — ABNORMAL HIGH (ref 11.2–14.5)
WBC: 3.7 10*3/uL — AB (ref 3.9–10.3)
lymph#: 1.4 10*3/uL (ref 0.9–3.3)

## 2014-11-19 LAB — COMPREHENSIVE METABOLIC PANEL (CC13)
ALBUMIN: 3.4 g/dL — AB (ref 3.5–5.0)
ALK PHOS: 58 U/L (ref 40–150)
ALT: 12 U/L (ref 0–55)
ANION GAP: 6 meq/L (ref 3–11)
AST: 19 U/L (ref 5–34)
BILIRUBIN TOTAL: 0.39 mg/dL (ref 0.20–1.20)
BUN: 19.3 mg/dL (ref 7.0–26.0)
CO2: 28 mEq/L (ref 22–29)
Calcium: 9.4 mg/dL (ref 8.4–10.4)
Chloride: 107 mEq/L (ref 98–109)
Creatinine: 0.8 mg/dL (ref 0.6–1.1)
EGFR: 79 mL/min/{1.73_m2} — AB (ref >90)
Glucose: 83 mg/dl (ref 70–140)
Potassium: 4.6 mEq/L (ref 3.5–5.1)
Sodium: 141 mEq/L (ref 136–145)
TOTAL PROTEIN: 7.4 g/dL (ref 6.4–8.3)

## 2014-11-19 LAB — UA PROTEIN, DIPSTICK - CHCC: Protein, ur: <30 mg/dL

## 2014-11-19 MED ORDER — SODIUM CHLORIDE 0.9 % IV SOLN
14.5000 mg/kg | Freq: Once | INTRAVENOUS | Status: AC
  Administered 2014-11-19: 1100 mg via INTRAVENOUS
  Filled 2014-11-19: qty 44

## 2014-11-19 MED ORDER — SODIUM CHLORIDE 0.9 % IV SOLN
Freq: Once | INTRAVENOUS | Status: AC
  Administered 2014-11-19: 13:00:00 via INTRAVENOUS

## 2014-11-19 MED ORDER — SODIUM CHLORIDE 0.9 % IJ SOLN
10.0000 mL | INTRAMUSCULAR | Status: DC | PRN
  Administered 2014-11-19: 10 mL
  Filled 2014-11-19: qty 10

## 2014-11-19 MED ORDER — HEPARIN SOD (PORK) LOCK FLUSH 100 UNIT/ML IV SOLN
500.0000 [IU] | Freq: Once | INTRAVENOUS | Status: AC | PRN
  Administered 2014-11-19: 500 [IU]
  Filled 2014-11-19: qty 5

## 2014-11-19 NOTE — Telephone Encounter (Signed)
Gave and printed appt sched and avs fo rpt for OCT and NOV  °

## 2014-11-19 NOTE — Progress Notes (Signed)
Pt went to the bathroom after chemo was completed, and came back after twenty minutes complaining of diarrhea. Came back to chair and ate some saltines and drank some ginger ale. Stated that she felt a little better. She took an imodium that she had with her. Vital signs stable.   After 15 minutes pt stated that she feels good enough to go home. Instructed pt to come back or call if diarrhea does not resolve. Pt expressed understanding.

## 2014-11-19 NOTE — Progress Notes (Signed)
Hematology and Oncology Follow Up Visit  [name] [id number] 1955/07/27 59 y.o. 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 This was diagnosed in July of 2014.   Prior Therapy:  She is status post paracentesis performed on 09/17/2012 with the cytology confirmed the presence of adenocarcinoma and immunohistochemical stains suggest GYN etiology. She is also status post lumpectomy for breast cancer diagnosed 25 years ago followed by radiation and chemotherapy under the care of Dr. Sonny Dandy. She did not receive any hormonal therapy. Chemotherapy utilizing carboplatin and Taxotere started on 10/01/2012. Avastin was added with cycle 4. This was discontinued in June of 2015.  Current therapy:  She is currently on a Avastin maintenance only after chemotherapy was discontinued in June of 2015.  Interim History: Mrs. Vrba presents today for a followup visit with her husband. Since her last visit, she reports no new complaints. She continues to have intermittent hematochezia and scheduled to receive colonoscopy on Monday. She has reported some occasional abdominal cramps and diarrhea but has not reported any mucus in her stool or change in her appetite.   She has tolerated Avastin without any new complications. She has not reported any infusion-related complications. She has not reported increase in her blood pressure or thrombosis. She continues to perform activities of daily living without any decline. She has not reported any abdominal distention or early satiety. She reports that her quality of life remained intact and able to have a she wants to do.  She denied any fever chills. Has not reported any shortness of breath or cough. She denied any pain with swallowing. She has reported also grade 1 neuropathy which remains stable.  She has not any shortness of breath or  chest pain. She denied any GYN bleeding. She has not reported any increase in her abdominal pain or early satiety. She also reports no hematochezia or melena. She has not reported any syncope alteration of mental status.  Her mood is excellent without any depression or anxiety. Remainder of the review of systems is unremarkable.  Medications: I have reviewed the patient's current medications.  Current Outpatient Prescriptions  Medication Sig Dispense Refill  . ALPRAZolam (XANAX) 1 MG tablet Take 1 mg by mouth at bedtime as needed for anxiety.    . betamethasone dipropionate (DIPROLENE) 0.05 % cream Apply topically 2 (two) times daily. (Patient taking differently: Apply topically as needed. ) 30 g 0  . calcium carbonate (TUMS - DOSED IN MG ELEMENTAL CALCIUM) 500 MG chewable tablet Chew 1 tablet by mouth as needed for indigestion or heartburn.    . divalproex (DEPAKOTE ER) 500 MG 24 hr tablet Take 1,500 mg by mouth at bedtime.     . gabapentin (NEURONTIN) 100 MG capsule TAKE ONE CAPSULE BY MOUTH THREE TIMES A DAY 90 capsule 1  . gabapentin (NEURONTIN) 100 MG capsule TAKE ONE CAPSULE BY MOUTH THREE TIMES A DAY 90 capsule 1  . lamoTRIgine (LAMICTAL) 200 MG tablet Take 200 mg by mouth at bedtime.  1  . levothyroxine (SYNTHROID, LEVOTHROID) 100 MCG tablet Take 100 mcg by mouth daily before breakfast.     . lidocaine-prilocaine (EMLA) cream Apply topically as needed. Apply to port with every chemotherapy. 30 g 1  . loperamide (IMODIUM A-D) 2 MG tablet Take 2 mg by mouth as needed for diarrhea or loose stools.    . Na Sulfate-K Sulfate-Mg Sulf  SOLN Please take as directed per colonoscopy instructions 354 mL 0  . OLANZapine-FLUoxetine (SYMBYAX) 6-25 MG per capsule     . omeprazole (PRILOSEC) 10 MG capsule Take 10 mg by mouth daily.    . ondansetron (ZOFRAN ODT) 8 MG disintegrating tablet Take 1 tablet (8 mg total) by mouth every 8 (eight) hours as needed for nausea or vomiting. 20 tablet 0  . ondansetron  (ZOFRAN) 8 MG tablet TAKE 1 TABLET BY MOUTH EVERY 8 HOURS AS NEEDED FOR NAUSEA AND VOMITING 20 tablet 1  . rOPINIRole (REQUIP) 1 MG tablet Take 1 mg by mouth at bedtime.     No current facility-administered medications for this visit.     Allergies:  Allergies  Allergen Reactions  . Pollen Extract     Pollen and grass causes a lot sneezing    Past Medical History, Surgical history, Social history, and Family History were reviewed and updated.   Physical Exam: Blood pressure 144/76, pulse 80, temperature 98 F (36.7 C), temperature source Oral, resp. rate 18, height 5\' 2"  (1.575 m), weight 161 lb 12.8 oz (73.392 kg), SpO2 99 %.  ECOG: 1 General appearance: awake alert woman with appropriate affect. She does not appear any physical distress. Head: Normocephalic, without obvious abnormality. Her sclerae was anicteric. Oral mucosa is moist and pink without ulcers. She does not have any oral thrush. Neck: no adenopathy Lymph nodes: Cervical, supraclavicular, and axillary nodes normal. Heart:regular rate and rhythm, S1, S2 normal, no murmur, click, rub or gallop Lung:chest clear, no wheezing, rales, normal symmetric air entry Abdomin: Soft, nontender with good bowel sounds. No ascites noted. EXT:no erythema, induration, or nodules. . Skin: No rashes or lesions.  Lab Results: Lab Results  Component Value Date   WBC 3.7* 11/19/2014   HGB 11.8 11/19/2014   HCT 38.2 11/19/2014   MCV 97.2 11/19/2014   PLT 66* 11/19/2014     Chemistry      Component Value Date/Time   NA 142 10/29/2014 1252   NA 143 06/03/2014 1142   K 4.8 10/29/2014 1252   K 4.3 06/03/2014 1142   CL 107 06/03/2014 1142   CO2 28 10/29/2014 1252   CO2 29 06/03/2014 1142   BUN 19.0 10/29/2014 1252   BUN 15 06/03/2014 1142   CREATININE 0.9 10/29/2014 1252   CREATININE 0.74 06/03/2014 1142      Component Value Date/Time   CALCIUM 9.7 10/29/2014 1252   CALCIUM 8.8 06/03/2014 1142   ALKPHOS 64 10/29/2014 1252    ALKPHOS 57 06/03/2014 1142   AST 24 10/29/2014 1252   AST 21 06/03/2014 1142   ALT 13 10/29/2014 1252   ALT 13 06/03/2014 1142   BILITOT 0.52 10/29/2014 1252   BILITOT 0.2* 06/03/2014 1142         Results for LANDREY, MAHURIN (MRN [id number]) as of 11/19/2014 11:57  Ref. Range 09/15/2014 11:07 10/08/2014 08:26 10/29/2014 12:52  CA 125 Latest Ref Range: <35 U/mL 53 (H) 58 (H) 55 (H)         Impression and Plan:  59 year old woman with the following issues:  1. Peritoneal carcinomatosis with omental involvement. The pathology confirmed the presence of adenocarcinoma of likely GYN etiology. Her CA 125 was elevated at 333.5 on 09/19/2012. She is status post systemic chemotherapy with an excellent response utilizing carboplatin and Taxotere and a Avastin . This therapy was held in June of 2015 and currently on Avastin maintenance.   CT scan obtained on 08/24/2014 continues to show  stable disease. She continues to tolerate the Avastin therapy without any complications. The plan is to continue with a Avastin maintenance and repeat imaging studies in November 2016.  2. IV access: She had a Port-A-Cath inserted without any complications. No evidence of bleeding or infection at this time.  3. Thrombocytopenia: Her platelets close to baseline without bleeding complications.  4. Weight gain: Stable at this time.  5. Nausea/diarrhea: Seems to be manageable at this time.  6. Hematochezia: Continues to be intermittent in nature and likely related to hemorrhoids. On endoscopy is scheduled in the near future. Hemoglobin appear adequate without significant blood loss.  7. The followup: She will followup in 3 weeks for her next cycle of a Avastin.    Zola Button, MD 9/30/201612:16 PM

## 2014-11-19 NOTE — Patient Instructions (Signed)
[facility] Discharge Instructions for Patients Receiving Chemotherapy  Today you received the following chemotherapy agents avastin To help prevent nausea and vomiting after your treatment, we encourage you to take your nausea medication.   If you develop nausea and vomiting that is not controlled by your nausea medication, call the clinic.   BELOW ARE SYMPTOMS THAT SHOULD BE REPORTED IMMEDIATELY:  *FEVER GREATER THAN 100.5 F  *CHILLS WITH OR WITHOUT FEVER  NAUSEA AND VOMITING THAT IS NOT CONTROLLED WITH YOUR NAUSEA MEDICATION  *UNUSUAL SHORTNESS OF BREATH  *UNUSUAL BRUISING OR BLEEDING  TENDERNESS IN MOUTH AND THROAT WITH OR WITHOUT PRESENCE OF ULCERS  *URINARY PROBLEMS  *BOWEL PROBLEMS  UNUSUAL RASH Items with * indicate a potential emergency and should be followed up as soon as possible.  Feel free to call the clinic you have any questions or concerns. The clinic phone number is [phone].  Please show the CHEMO ALERT CARD at check-in to the Emergency Department and triage nurse.   

## 2014-11-20 LAB — CA 125: CA 125: 57 U/mL — ABNORMAL HIGH (ref <35)

## 2014-11-22 ENCOUNTER — Ambulatory Visit (AMBULATORY_SURGERY_CENTER): Payer: BLUE CROSS/BLUE SHIELD | Admitting: Gastroenterology

## 2014-11-22 ENCOUNTER — Encounter: Payer: Self-pay | Admitting: Gastroenterology

## 2014-11-22 VITALS — BP 179/86 | HR 76 | Temp 96.0°F | Resp 27 | Ht 62.0 in | Wt 162.0 lb

## 2014-11-22 DIAGNOSIS — Z1211 Encounter for screening for malignant neoplasm of colon: Secondary | ICD-10-CM

## 2014-11-22 DIAGNOSIS — K921 Melena: Secondary | ICD-10-CM

## 2014-11-22 MED ORDER — SODIUM CHLORIDE 0.9 % IV SOLN: 500.0000 mL | INTRAVENOUS | Status: DC

## 2014-11-22 NOTE — Progress Notes (Signed)
Patient spent at least 1/2 hour in the bathroom.  Husband is with her, and he is helping her get dressed.  It's 11:21 now and I have not reviewed discharge instructions yet.  Husband told me that she needed "a few more minutes to get dressed." Patient doesn't complain of any pain at this time.

## 2014-11-22 NOTE — Patient Instructions (Signed)
YOU HAD AN ENDOSCOPIC PROCEDURE TODAY AT [facility]:   Refer to the procedure report that was given to you for any specific questions about what was found during the examination.  If the procedure report does not answer your questions, please call your gastroenterologist to clarify.  If you requested that your care partner not be given the details of your procedure findings, then the procedure report has been included in a sealed envelope for you to review at your convenience later.  YOU SHOULD EXPECT: Some feelings of bloating in the abdomen. Passage of more gas than usual.  Walking can help get rid of the air that was put into your GI tract during the procedure and reduce the bloating. If you had a lower endoscopy (such as a colonoscopy or flexible sigmoidoscopy) you may notice spotting of blood in your stool or on the toilet paper. If you underwent a bowel prep for your procedure, you may not have a normal bowel movement for a few days.  Please Note:  You might notice some irritation and congestion in your nose or some drainage.  This is from the oxygen used during your procedure.  There is no need for concern and it should clear up in a day or so.  SYMPTOMS TO REPORT IMMEDIATELY:   Following lower endoscopy (colonoscopy or flexible sigmoidoscopy):  Excessive amounts of blood in the stool  Significant tenderness or worsening of abdominal pains  Swelling of the abdomen that is new, acute  Fever of 100F or higher   For urgent or emergent issues, a gastroenterologist can be reached at any hour by calling [phone].   DIET: Your first meal following the procedure should be a small meal and then it is ok to progress to your normal diet. Heavy or fried foods are harder to digest and may make you feel nauseous or bloated.  Likewise, meals heavy in dairy and vegetables can increase bloating.  Drink plenty of fluids but you should avoid alcoholic beverages for 24 hours. Try to  increase the fiber in your diet.  ACTIVITY:  You should plan to take it easy for the rest of today and you should NOT DRIVE or use heavy machinery until tomorrow (because of the sedation medicines used during the test).    FOLLOW UP: Our staff will call the number listed on your records the next business day following your procedure to check on you and address any questions or concerns that you may have regarding the information given to you following your procedure. If we do not reach you, we will leave a message.  However, if you are feeling well and you are not experiencing any problems, there is no need to return our call.  We will assume that you have returned to your regular daily activities without incident.  If any biopsies were taken you will be contacted by phone or by letter within the next 1-3 weeks.  Please call us at [phone] if you have not heard about the biopsies in 3 weeks.    SIGNATURES/CONFIDENTIALITY: You and/or your care partner have signed paperwork which will be entered into your electronic medical record.  These signatures attest to the fact that that the information above on your After Visit Summary has been reviewed and is understood.  Full responsibility of the confidentiality of this discharge information lies with you and/or your care-partner.  Read all of the handouts given to you by your recovery room nurse.

## 2014-11-22 NOTE — Op Note (Signed)
[place]  [address] [place], [id number]   COLONOSCOPY PROCEDURE REPORT  PATIENT: [name], [name]  MR#: [id number] BIRTHDATE: May 24, 1955 , 56  yrs. old GENDER: female ENDOSCOPIST: Yetta Flock, MD REFERRED BY: Zola Button MD PROCEDURE DATE:  11/22/2014 PROCEDURE:   Colonoscopy, screening First Screening Colonoscopy - Avg.  risk and is 50 yrs.  old or older - No.  Prior Negative Screening - Now for repeat screening. 10 or more years since last screening  History of Adenoma - Now for follow-up colonoscopy & has been > or = to 3 yrs.  N/A  Polyps removed today? No Recommend repeat exam, <10 yrs? No ASA CLASS:   Class III INDICATIONS:Screening for colonic neoplasia and Colorectal Neoplasm Risk Assessment for this procedure is average risk, rectal bleeding, history of peritoneal carcinomatosis. MEDICATIONS: Propofol 300 mg IV  DESCRIPTION OF PROCEDURE:   After the risks benefits and alternatives of the procedure were thoroughly explained, informed consent was obtained.  The digital rectal exam revealed no abnormalities of the rectum.   The [id number]  endoscope was introduced through the anus and advanced to the cecum, which was identified by both the appendix and ileocecal valve. No adverse events experienced.   The quality of the prep was adequate  The instrument was then slowly withdrawn as the colon was fully examined. Estimated blood loss is zero unless otherwise noted in this procedure report.  COLON FINDINGS: There was moderate diverticulosis noted in the sigmoid colon with associated angulation.   The examination was otherwise normal.  No polyps or mass lesions. Retroflexed views revealed internal hemorrhoids. The time to cecum = 9.5 Withdrawal time = 12.7   The scope was withdrawn and the procedure completed. COMPLICATIONS: There were no immediate complications.  ENDOSCOPIC IMPRESSION: 1.   There was moderate diverticulosis noted in the  sigmoid colon 2.   The examination was otherwise normal no polyps or mass lesions 3.   Internal hemorrhoids noted which I suspect are the likely cause of the patient's symptoms  RECOMMENDATIONS: Resume diet Resume medications Repeat screening colonoscopy in 10 years Daily fiber supplement for treatment of hemorrhoids which I suspect is the likely cause of the patient's symptoms  eSigned:  Yetta Flock, MD 11/22/2014 10:31 AM   cc: Zola Button MD, the patient

## 2014-11-22 NOTE — Progress Notes (Signed)
To recovery, report to Hodges, RN, VSS 

## 2014-11-23 ENCOUNTER — Telehealth: Payer: Self-pay | Admitting: *Deleted

## 2014-11-23 NOTE — Telephone Encounter (Signed)
  Follow up Call-  Call back number 11/22/2014  Post procedure Call Back phone  # [phone]  Permission to leave phone message Yes     Patient questions:  Do you have a fever, pain , or abdominal swelling? No. Pain Score  0 *  Have you tolerated food without any problems? Yes.    Have you been able to return to your normal activities? Yes.    Do you have any questions about your discharge instructions: Diet   No. Medications  No. Follow up visit  No.  Do you have questions or concerns about your Care? No.  Actions: * If pain score is 4 or above: No action needed, pain <4.

## 2014-12-10 ENCOUNTER — Ambulatory Visit (HOSPITAL_BASED_OUTPATIENT_CLINIC_OR_DEPARTMENT_OTHER): Payer: BLUE CROSS/BLUE SHIELD

## 2014-12-10 ENCOUNTER — Other Ambulatory Visit (HOSPITAL_BASED_OUTPATIENT_CLINIC_OR_DEPARTMENT_OTHER): Payer: BLUE CROSS/BLUE SHIELD

## 2014-12-10 ENCOUNTER — Telehealth: Payer: Self-pay | Admitting: Oncology

## 2014-12-10 ENCOUNTER — Ambulatory Visit (HOSPITAL_BASED_OUTPATIENT_CLINIC_OR_DEPARTMENT_OTHER): Payer: BLUE CROSS/BLUE SHIELD | Admitting: Oncology

## 2014-12-10 VITALS — BP 142/77 | HR 57 | Temp 98.3°F | Resp 20 | Ht 62.0 in | Wt 160.8 lb

## 2014-12-10 DIAGNOSIS — D696 Thrombocytopenia, unspecified: Secondary | ICD-10-CM

## 2014-12-10 DIAGNOSIS — C786 Secondary malignant neoplasm of retroperitoneum and peritoneum: Secondary | ICD-10-CM | POA: Diagnosis not present

## 2014-12-10 DIAGNOSIS — C569 Malignant neoplasm of unspecified ovary: Secondary | ICD-10-CM

## 2014-12-10 DIAGNOSIS — C801 Malignant (primary) neoplasm, unspecified: Secondary | ICD-10-CM

## 2014-12-10 DIAGNOSIS — Z5112 Encounter for antineoplastic immunotherapy: Secondary | ICD-10-CM | POA: Diagnosis not present

## 2014-12-10 LAB — CBC WITH DIFFERENTIAL/PLATELET
BASO%: 1.1 % (ref 0.0–2.0)
Basophils Absolute: 0 10*3/uL (ref 0.0–0.1)
EOS ABS: 0.2 10*3/uL (ref 0.0–0.5)
EOS%: 4.3 % (ref 0.0–7.0)
HEMATOCRIT: 41 % (ref 34.8–46.6)
HGB: 12.8 g/dL (ref 11.6–15.9)
LYMPH#: 1.5 10*3/uL (ref 0.9–3.3)
LYMPH%: 37.2 % (ref 14.0–49.7)
MCH: 29.4 pg (ref 25.1–34.0)
MCHC: 31.3 g/dL — ABNORMAL LOW (ref 31.5–36.0)
MCV: 93.9 fL (ref 79.5–101.0)
MONO#: 0.4 10*3/uL (ref 0.1–0.9)
MONO%: 11.2 % (ref 0.0–14.0)
NEUT#: 1.8 10*3/uL (ref 1.5–6.5)
NEUT%: 46.2 % (ref 38.4–76.8)
PLATELETS: 107 10*3/uL — AB (ref 145–400)
RBC: 4.36 10*6/uL (ref 3.70–5.45)
RDW: 15.3 % — AB (ref 11.2–14.5)
WBC: 4 10*3/uL (ref 3.9–10.3)

## 2014-12-10 LAB — COMPREHENSIVE METABOLIC PANEL (CC13)
ALBUMIN: 3.4 g/dL — AB (ref 3.5–5.0)
ALT: 9 U/L (ref 0–55)
AST: 18 U/L (ref 5–34)
Alkaline Phosphatase: 65 U/L (ref 40–150)
Anion Gap: 6 mEq/L (ref 3–11)
BILIRUBIN TOTAL: 0.3 mg/dL (ref 0.20–1.20)
BUN: 12.6 mg/dL (ref 7.0–26.0)
CALCIUM: 9.4 mg/dL (ref 8.4–10.4)
CO2: 27 mEq/L (ref 22–29)
Chloride: 109 mEq/L (ref 98–109)
Creatinine: 0.8 mg/dL (ref 0.6–1.1)
EGFR: 83 mL/min/{1.73_m2} — AB (ref >90)
Glucose: 88 mg/dl (ref 70–140)
POTASSIUM: 4.9 meq/L (ref 3.5–5.1)
Sodium: 142 mEq/L (ref 136–145)
Total Protein: 7.7 g/dL (ref 6.4–8.3)

## 2014-12-10 MED ORDER — SODIUM CHLORIDE 0.9 % IV SOLN
14.5000 mg/kg | Freq: Once | INTRAVENOUS | Status: AC
  Administered 2014-12-10: 1100 mg via INTRAVENOUS
  Filled 2014-12-10: qty 44

## 2014-12-10 MED ORDER — HEPARIN SOD (PORK) LOCK FLUSH 100 UNIT/ML IV SOLN
500.0000 [IU] | Freq: Once | INTRAVENOUS | Status: AC | PRN
  Administered 2014-12-10: 500 [IU]
  Filled 2014-12-10: qty 5

## 2014-12-10 MED ORDER — SODIUM CHLORIDE 0.9 % IJ SOLN
10.0000 mL | INTRAMUSCULAR | Status: DC | PRN
  Administered 2014-12-10: 10 mL
  Filled 2014-12-10: qty 10

## 2014-12-10 MED ORDER — SODIUM CHLORIDE 0.9 % IV SOLN
Freq: Once | INTRAVENOUS | Status: AC
  Administered 2014-12-10: 11:00:00 via INTRAVENOUS

## 2014-12-10 NOTE — Telephone Encounter (Signed)
per pof to sch pt appt-gave pt copy of avs-gavep contrast-adv central sch will call to sch avs

## 2014-12-10 NOTE — Progress Notes (Signed)
Hematology and Oncology Follow Up Visit  LARAMIE GELLES [id number] 08-Jul-1955 59 y.o. 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 This was diagnosed in July of 2014.   Prior Therapy:  She is status post paracentesis performed on 09/17/2012 with the cytology confirmed the presence of adenocarcinoma and immunohistochemical stains suggest GYN etiology. She is also status post lumpectomy for breast cancer diagnosed 25 years ago followed by radiation and chemotherapy under the care of Dr. Sonny Dandy. She did not receive any hormonal therapy. Chemotherapy utilizing carboplatin and Taxotere started on 10/01/2012. Avastin was added with cycle 4. This was discontinued in June of 2015.  Current therapy:  She is currently on a Avastin maintenance only after chemotherapy was discontinued in June of 2015.  Interim History: Mrs. Gullickson presents today for a followup visit with her husband. Since her last visit, she reports more fatigue and tiredness. She feels exhausted after activity and have been evaluated by her psychiatrist. I recommended laboratory testing that includes Depakote level as well as ammonia level. She underwent a colonoscopy that was unremarkable as well. She is no longer reporting any hematochezia.  She has tolerated Avastin without any new complications. She has not reported any infusion-related complications. She has not reported increase in her blood pressure or thrombosis. She has not reported any abdominal distention or early satiety. She reports that her quality of life remained intact without any deterioration or decline.  She denied any fever chills. Has not reported any shortness of breath or cough. She denied any pain with swallowing. She has reported also grade 1 neuropathy which remains stable.  She has not any shortness of breath or chest pain. She  denied any GYN bleeding. She has not reported any increase in her abdominal pain or early satiety. She also reports no hematochezia or melena. She has not reported any syncope alteration of mental status.  Her mood is excellent without any depression or anxiety. Remainder of the review of systems is unremarkable.  Medications: I have reviewed the patient's current medications.  Current Outpatient Prescriptions  Medication Sig Dispense Refill  . ALPRAZolam (XANAX) 1 MG tablet Take 1 mg by mouth at bedtime as needed for anxiety.    . betamethasone dipropionate (DIPROLENE) 0.05 % cream Apply topically 2 (two) times daily. (Patient taking differently: Apply topically as needed. ) 30 g 0  . calcium carbonate (TUMS - DOSED IN MG ELEMENTAL CALCIUM) 500 MG chewable tablet Chew 1 tablet by mouth as needed for indigestion or heartburn.    . divalproex (DEPAKOTE ER) 500 MG 24 hr tablet Take 1,500 mg by mouth at bedtime.     . gabapentin (NEURONTIN) 100 MG capsule TAKE ONE CAPSULE BY MOUTH THREE TIMES A DAY 90 capsule 1  . lamoTRIgine (LAMICTAL) 200 MG tablet Take 200 mg by mouth at bedtime.  1  . levothyroxine (SYNTHROID, LEVOTHROID) 100 MCG tablet Take 100 mcg by mouth daily before breakfast.     . lidocaine-prilocaine (EMLA) cream Apply topically as needed. Apply to port with every chemotherapy. 30 g 1  . loperamide (IMODIUM A-D) 2 MG tablet Take 2 mg by mouth as needed for diarrhea or loose stools.    Marland Kitchen omeprazole (PRILOSEC) 10 MG capsule Take 10 mg by mouth daily.    . ondansetron (ZOFRAN ODT) 8 MG disintegrating tablet Take 1 tablet (8 mg total) by mouth every 8 (eight)  hours as needed for nausea or vomiting. 20 tablet 0  . ondansetron (ZOFRAN) 8 MG tablet TAKE 1 TABLET BY MOUTH EVERY 8 HOURS AS NEEDED FOR NAUSEA AND VOMITING 20 tablet 1  . rOPINIRole (REQUIP) 1 MG tablet Take 1 mg by mouth at bedtime.     No current facility-administered medications for this visit.     Allergies:  Allergies   Allergen Reactions  . Pollen Extract Other (See Comments)    Pollen and grass causes a lot sneezing    Past Medical History, Surgical history, Social history, and Family History were reviewed and updated.   Physical Exam: Blood pressure 142/77, pulse 57, temperature 98.3 F (36.8 C), temperature source Oral, resp. rate 20, height 5\' 2"  (1.575 m), weight 160 lb 12.8 oz (72.938 kg), SpO2 98 %.  ECOG: 1 General appearance: She does not appear any physical distress. Slightly sleepy today. Head: Normocephalic, without obvious abnormality. Her sclerae was anicteric. Oral mucosa  appeared normal and does not have any oral thrush. Neck: no adenopathy Lymph nodes: Cervical, supraclavicular, and axillary nodes normal. Heart:regular rate and rhythm, S1, S2 normal, no murmur, click, rub or gallop Lung:chest clear, no wheezing, rales, normal symmetric air entry Abdomin: Soft, nontender with good bowel sounds. No ascites noted. EXT:no erythema, induration, or nodules. . Skin: No rashes or lesions.  Lab Results: Lab Results  Component Value Date   WBC 4.0 12/10/2014   HGB 12.8 12/10/2014   HCT 41.0 12/10/2014   MCV 93.9 12/10/2014   PLT 107* 12/10/2014     Chemistry      Component Value Date/Time   NA 141 11/19/2014 1152   NA 143 06/03/2014 1142   K 4.6 11/19/2014 1152   K 4.3 06/03/2014 1142   CL 107 06/03/2014 1142   CO2 28 11/19/2014 1152   CO2 29 06/03/2014 1142   BUN 19.3 11/19/2014 1152   BUN 15 06/03/2014 1142   CREATININE 0.8 11/19/2014 1152   CREATININE 0.74 06/03/2014 1142      Component Value Date/Time   CALCIUM 9.4 11/19/2014 1152   CALCIUM 8.8 06/03/2014 1142   ALKPHOS 58 11/19/2014 1152   ALKPHOS 57 06/03/2014 1142   AST 19 11/19/2014 1152   AST 21 06/03/2014 1142   ALT 12 11/19/2014 1152   ALT 13 06/03/2014 1142   BILITOT 0.39 11/19/2014 1152   BILITOT 0.2* 06/03/2014 1142         Results for JONESHA, TSUCHIYA (MRN [id number]) as of 12/10/2014 09:54   Ref. Range 10/29/2014 12:52 11/19/2014 11:52  CA 125 Latest Ref Range: <35 U/mL 55 (H) 57 (H)          Impression and Plan:  59 year old woman with the following issues:  1. Peritoneal carcinomatosis with omental involvement. The pathology confirmed the presence of adenocarcinoma of likely GYN etiology. Her CA 125 was elevated at 333.5 on 09/19/2012. She is status post systemic chemotherapy with an excellent response utilizing carboplatin and Taxotere and a Avastin . This therapy was held in June of 2015 and currently on Avastin maintenance.   CT scan obtained on 08/24/2014 continues to show stable disease. She continues to tolerate the Avastin therapy without any complications. Her disease have been under reasonable control with this current regimen and I plan on restaging her with a CT scan on 12/29/2014. She continues to have reasonable stable disease will continue with maintenance Avastin. We will use different agents upon symptomatic progression.  2. IV access: She had a Port-A-Cath inserted  without any complications. She has not reported any pain or discomfort.  3. Thrombocytopenia: Her platelets count is much improved above 100,000 for the first time.  4. Weight gain: No major changes in her weight at this time.  5. Nausea/diarrhea: Seems to be manageable at this time. She has antiemetics and antidiarrheal medication available.  6. Hematochezia: Continues to be intermittent in nature and likely related to hemorrhoids. Colonoscopy did not reveal any abnormalities.  7. The fatigue and tiredness: Ammonia and Depakote level will be checked per her psychiatrist.  8. The followup: She will followup in 3 weeks for her next cycle of a Avastin.    KRCVKF,MMCRF, MD 10/21/201610:07 AM

## 2014-12-10 NOTE — Patient Instructions (Signed)
[facility] Discharge Instructions for Patients Receiving Chemotherapy  Today you received the following chemotherapy agents Avastin To help prevent nausea and vomiting after your treatment, we encourage you to take your nausea medication as prescribed.   If you develop nausea and vomiting that is not controlled by your nausea medication, call the clinic.   BELOW ARE SYMPTOMS THAT SHOULD BE REPORTED IMMEDIATELY:  *FEVER GREATER THAN 100.5 F  *CHILLS WITH OR WITHOUT FEVER  NAUSEA AND VOMITING THAT IS NOT CONTROLLED WITH YOUR NAUSEA MEDICATION  *UNUSUAL SHORTNESS OF BREATH  *UNUSUAL BRUISING OR BLEEDING  TENDERNESS IN MOUTH AND THROAT WITH OR WITHOUT PRESENCE OF ULCERS  *URINARY PROBLEMS  *BOWEL PROBLEMS  UNUSUAL RASH Items with * indicate a potential emergency and should be followed up as soon as possible.  Feel free to call the clinic you have any questions or concerns. The clinic phone number is [phone].  Please show the CHEMO ALERT CARD at check-in to the Emergency Department and triage nurse.   

## 2014-12-11 LAB — CA 125: CA 125: 66 U/mL — ABNORMAL HIGH (ref <35)

## 2014-12-29 ENCOUNTER — Encounter (HOSPITAL_COMMUNITY): Payer: Self-pay

## 2014-12-29 ENCOUNTER — Ambulatory Visit (HOSPITAL_COMMUNITY)
Admission: RE | Admit: 2014-12-29 | Discharge: 2014-12-29 | Disposition: A | Discharge status: Home / Self Care | Payer: BLUE CROSS/BLUE SHIELD | Source: Ambulatory Visit | Attending: Oncology | Admitting: Oncology

## 2014-12-29 ENCOUNTER — Other Ambulatory Visit: Payer: BLUE CROSS/BLUE SHIELD

## 2014-12-29 DIAGNOSIS — I2510 Atherosclerotic heart disease of native coronary artery without angina pectoris: Secondary | ICD-10-CM | POA: Insufficient documentation

## 2014-12-29 DIAGNOSIS — Z79899 Other long term (current) drug therapy: Secondary | ICD-10-CM | POA: Insufficient documentation

## 2014-12-29 DIAGNOSIS — K449 Diaphragmatic hernia without obstruction or gangrene: Secondary | ICD-10-CM | POA: Insufficient documentation

## 2014-12-29 DIAGNOSIS — R197 Diarrhea, unspecified: Secondary | ICD-10-CM | POA: Diagnosis not present

## 2014-12-29 DIAGNOSIS — K7460 Unspecified cirrhosis of liver: Secondary | ICD-10-CM | POA: Insufficient documentation

## 2014-12-29 DIAGNOSIS — C569 Malignant neoplasm of unspecified ovary: Secondary | ICD-10-CM | POA: Insufficient documentation

## 2014-12-29 DIAGNOSIS — C786 Secondary malignant neoplasm of retroperitoneum and peritoneum: Secondary | ICD-10-CM | POA: Insufficient documentation

## 2014-12-29 MED ORDER — IOHEXOL 300 MG/ML  SOLN
100.0000 mL | Freq: Once | INTRAMUSCULAR | Status: AC | PRN
  Administered 2014-12-29: 100 mL via INTRAVENOUS

## 2014-12-30 ENCOUNTER — Ambulatory Visit: Payer: Self-pay | Admitting: Podiatry

## 2014-12-31 ENCOUNTER — Ambulatory Visit (HOSPITAL_BASED_OUTPATIENT_CLINIC_OR_DEPARTMENT_OTHER): Payer: BLUE CROSS/BLUE SHIELD | Admitting: Oncology

## 2014-12-31 ENCOUNTER — Other Ambulatory Visit (HOSPITAL_BASED_OUTPATIENT_CLINIC_OR_DEPARTMENT_OTHER): Payer: BLUE CROSS/BLUE SHIELD

## 2014-12-31 ENCOUNTER — Ambulatory Visit (HOSPITAL_BASED_OUTPATIENT_CLINIC_OR_DEPARTMENT_OTHER): Payer: BLUE CROSS/BLUE SHIELD

## 2014-12-31 ENCOUNTER — Telehealth: Payer: Self-pay | Admitting: Oncology

## 2014-12-31 VITALS — BP 129/72 | HR 76 | Temp 97.7°F | Resp 16 | Ht 62.0 in | Wt 163.6 lb

## 2014-12-31 VITALS — BP 114/63 | HR 75

## 2014-12-31 DIAGNOSIS — D696 Thrombocytopenia, unspecified: Secondary | ICD-10-CM

## 2014-12-31 DIAGNOSIS — C569 Malignant neoplasm of unspecified ovary: Secondary | ICD-10-CM

## 2014-12-31 DIAGNOSIS — Z5112 Encounter for antineoplastic immunotherapy: Secondary | ICD-10-CM | POA: Diagnosis not present

## 2014-12-31 DIAGNOSIS — C786 Secondary malignant neoplasm of retroperitoneum and peritoneum: Secondary | ICD-10-CM

## 2014-12-31 DIAGNOSIS — C801 Malignant (primary) neoplasm, unspecified: Secondary | ICD-10-CM

## 2014-12-31 LAB — CBC WITH DIFFERENTIAL/PLATELET
BASO%: 0.3 % (ref 0.0–2.0)
BASOS ABS: 0 10*3/uL (ref 0.0–0.1)
EOS ABS: 0.2 10*3/uL (ref 0.0–0.5)
EOS%: 6.2 % (ref 0.0–7.0)
HCT: 41.1 % (ref 34.8–46.6)
HEMOGLOBIN: 12.5 g/dL (ref 11.6–15.9)
LYMPH%: 35.4 % (ref 14.0–49.7)
MCH: 29.8 pg (ref 25.1–34.0)
MCHC: 30.4 g/dL — ABNORMAL LOW (ref 31.5–36.0)
MCV: 97.9 fL (ref 79.5–101.0)
MONO#: 0.5 10*3/uL (ref 0.1–0.9)
MONO%: 12.6 % (ref 0.0–14.0)
NEUT%: 45.5 % (ref 38.4–76.8)
NEUTROS ABS: 1.7 10*3/uL (ref 1.5–6.5)
PLATELETS: 66 10*3/uL — AB (ref 145–400)
RBC: 4.2 10*6/uL (ref 3.70–5.45)
RDW: 14.9 % — ABNORMAL HIGH (ref 11.2–14.5)
WBC: 3.7 10*3/uL — ABNORMAL LOW (ref 3.9–10.3)
lymph#: 1.3 10*3/uL (ref 0.9–3.3)

## 2014-12-31 LAB — COMPREHENSIVE METABOLIC PANEL (CC13)
ALT: 11 U/L (ref 0–55)
ANION GAP: 7 meq/L (ref 3–11)
AST: 19 U/L (ref 5–34)
Albumin: 3.4 g/dL — ABNORMAL LOW (ref 3.5–5.0)
Alkaline Phosphatase: 58 U/L (ref 40–150)
BILIRUBIN TOTAL: 0.35 mg/dL (ref 0.20–1.20)
BUN: 15.4 mg/dL (ref 7.0–26.0)
CO2: 29 meq/L (ref 22–29)
Calcium: 9.3 mg/dL (ref 8.4–10.4)
Chloride: 105 mEq/L (ref 98–109)
Creatinine: 0.8 mg/dL (ref 0.6–1.1)
EGFR: 79 mL/min/{1.73_m2} — ABNORMAL LOW (ref >90)
GLUCOSE: 68 mg/dL — AB (ref 70–140)
Potassium: 4.8 mEq/L (ref 3.5–5.1)
Sodium: 141 mEq/L (ref 136–145)
TOTAL PROTEIN: 7.6 g/dL (ref 6.4–8.3)

## 2014-12-31 MED ORDER — SODIUM CHLORIDE 0.9 % IV SOLN
Freq: Once | INTRAVENOUS | Status: AC
  Administered 2014-12-31: 14:00:00 via INTRAVENOUS

## 2014-12-31 MED ORDER — SODIUM CHLORIDE 0.9 % IV SOLN
14.5000 mg/kg | Freq: Once | INTRAVENOUS | Status: AC
  Administered 2014-12-31: 1100 mg via INTRAVENOUS
  Filled 2014-12-31: qty 44

## 2014-12-31 MED ORDER — HEPARIN SOD (PORK) LOCK FLUSH 100 UNIT/ML IV SOLN
500.0000 [IU] | Freq: Once | INTRAVENOUS | Status: AC | PRN
  Administered 2014-12-31: 500 [IU]
  Filled 2014-12-31: qty 5

## 2014-12-31 MED ORDER — SODIUM CHLORIDE 0.9 % IJ SOLN
10.0000 mL | INTRAMUSCULAR | Status: DC | PRN
  Administered 2014-12-31: 10 mL
  Filled 2014-12-31: qty 10

## 2014-12-31 NOTE — Patient Instructions (Signed)
[facility] Discharge Instructions for Patients Receiving Chemotherapy  Today you received the following chemotherapy agents Avastin To help prevent nausea and vomiting after your treatment, we encourage you to take your nausea medication as prescribed.   If you develop nausea and vomiting that is not controlled by your nausea medication, call the clinic.   BELOW ARE SYMPTOMS THAT SHOULD BE REPORTED IMMEDIATELY:  *FEVER GREATER THAN 100.5 F  *CHILLS WITH OR WITHOUT FEVER  NAUSEA AND VOMITING THAT IS NOT CONTROLLED WITH YOUR NAUSEA MEDICATION  *UNUSUAL SHORTNESS OF BREATH  *UNUSUAL BRUISING OR BLEEDING  TENDERNESS IN MOUTH AND THROAT WITH OR WITHOUT PRESENCE OF ULCERS  *URINARY PROBLEMS  *BOWEL PROBLEMS  UNUSUAL RASH Items with * indicate a potential emergency and should be followed up as soon as possible.  Feel free to call the clinic you have any questions or concerns. The clinic phone number is [phone].  Please show the CHEMO ALERT CARD at check-in to the Emergency Department and triage nurse.   

## 2014-12-31 NOTE — Progress Notes (Signed)
Per Dixie RN w/ Dr. Alen Blew: okay to tx with platelets 66. Per office note, value is stable without any bleeding.

## 2014-12-31 NOTE — Progress Notes (Signed)
Hematology and Oncology Follow Up Visit  [name] [id number] [birth year] 59 y.o. 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 This was diagnosed in July of 2014.   Prior Therapy:  She is status post paracentesis performed on 09/17/2012 with the cytology confirmed the presence of adenocarcinoma and immunohistochemical stains suggest GYN etiology. She is also status post lumpectomy for breast cancer diagnosed 25 years ago followed by radiation and chemotherapy under the care of Dr. Sonny Dandy. She did not receive any hormonal therapy. Chemotherapy utilizing carboplatin and Taxotere started on 10/01/2012. Avastin was added with cycle 4. This was discontinued in June of 2015.  Current therapy:  She is currently on a Avastin maintenance only after chemotherapy was discontinued in June of 2015.  Interim History: Mrs. Stjulian presents today for a followup visit with her husband. Since her last visit, she continues to do relatively well without any major complaints. She does report occasional fatigue and likely attributed to her mood disorder medication likely Depakote. She is under consideration for possible change to a different formulation of this medicine.    She has tolerated Avastin without any new complications. She has not reported any infusion-related complications. She has not reported increase in her blood pressure or thrombosis. She has not reported any abdominal distention or early satiety. she continues to be relatively active although she does report some occasional achiness in her lower extremities.   She does not report any headaches, blurry vision, syncope or seizures. She denied any fever chills. Has not reported any shortness of breath or cough. She denied any pain with swallowing. She has reported also grade 1 neuropathy which remains stable.  She has  not any shortness of breath or chest pain. She denied any GYN bleeding. She has not reported any increase in her abdominal pain or early satiety. She also reports no hematochezia or melena.  Her mood is excellent without any depression or anxiety. Remainder of the review of systems is unremarkable.  Medications: I have reviewed the patient's current medications.  Current Outpatient Prescriptions  Medication Sig Dispense Refill  . ALPRAZolam (XANAX) 1 MG tablet Take 1 mg by mouth at bedtime as needed for anxiety.    . betamethasone dipropionate (DIPROLENE) 0.05 % cream Apply topically 2 (two) times daily. (Patient taking differently: Apply topically as needed. ) 30 g 0  . calcium carbonate (TUMS - DOSED IN MG ELEMENTAL CALCIUM) 500 MG chewable tablet Chew 1 tablet by mouth as needed for indigestion or heartburn.    . divalproex (DEPAKOTE ER) 500 MG 24 hr tablet Take 1,500 mg by mouth at bedtime.     . gabapentin (NEURONTIN) 100 MG capsule TAKE ONE CAPSULE BY MOUTH THREE TIMES A DAY 90 capsule 1  . lamoTRIgine (LAMICTAL) 200 MG tablet Take 200 mg by mouth at bedtime.  1  . levothyroxine (SYNTHROID, LEVOTHROID) 100 MCG tablet Take 100 mcg by mouth daily before breakfast.     . lidocaine-prilocaine (EMLA) cream Apply topically as needed. Apply to port with every chemotherapy. 30 g 1  . loperamide (IMODIUM A-D) 2 MG tablet Take 2 mg by mouth as needed for diarrhea or loose stools.    Marland Kitchen omeprazole (PRILOSEC) 10 MG capsule Take 10 mg by mouth daily.    . ondansetron (ZOFRAN ODT) 8 MG disintegrating tablet Take 1 tablet (8 mg total) by mouth every 8 (eight) hours  as needed for nausea or vomiting. 20 tablet 0  . ondansetron (ZOFRAN) 8 MG tablet TAKE 1 TABLET BY MOUTH EVERY 8 HOURS AS NEEDED FOR NAUSEA AND VOMITING 20 tablet 1  . rOPINIRole (REQUIP) 1 MG tablet Take 1 mg by mouth at bedtime.     No current facility-administered medications for this visit.     Allergies:  Allergies  Allergen Reactions   . Pollen Extract Other (See Comments)    Pollen and grass causes a lot sneezing    Past Medical History, Surgical history, Social history, and Family History were reviewed and updated.   Physical Exam: Blood pressure 129/72, pulse 76, temperature 97.7 F (36.5 C), temperature source Oral, resp. rate 16, height 5\' 2"  (1.575 m), weight 163 lb 9.6 oz (74.208 kg), SpO2 99 %.  ECOG: 1 General appearance: awake alert woman in excellent mood. Head: Normocephalic, without obvious abnormality. Her sclerae was anicteric. Oral mucosa  moist and pink without ulcers.  Neck: no adenopathy Lymph nodes: Cervical, supraclavicular, and axillary nodes normal. Heart:regular rate and rhythm, S1, S2 normal, no murmur, click, rub or gallop Lung:chest clear, no wheezing, rales, normal symmetric air entry Abdomin: Soft, nontender with good bowel sounds. no shifting dullness. EXT:no erythema, induration, or nodules. . Skin: No rashes or lesions.  Lab Results: Lab Results  Component Value Date   WBC 3.7* 12/31/2014   HGB 12.5 12/31/2014   HCT 41.1 12/31/2014   MCV 97.9 12/31/2014   PLT 66* 12/31/2014     Chemistry      Component Value Date/Time   NA 141 12/31/2014 1126   NA 143 06/03/2014 1142   K 4.8 12/31/2014 1126   K 4.3 06/03/2014 1142   CL 107 06/03/2014 1142   CO2 29 12/31/2014 1126   CO2 29 06/03/2014 1142   BUN 15.4 12/31/2014 1126   BUN 15 06/03/2014 1142   CREATININE 0.8 12/31/2014 1126   CREATININE 0.74 06/03/2014 1142      Component Value Date/Time   CALCIUM 9.3 12/31/2014 1126   CALCIUM 8.8 06/03/2014 1142   ALKPHOS 58 12/31/2014 1126   ALKPHOS 57 06/03/2014 1142   AST 19 12/31/2014 1126   AST 21 06/03/2014 1142   ALT 11 12/31/2014 1126   ALT 13 06/03/2014 1142   BILITOT 0.35 12/31/2014 1126   BILITOT 0.2* 06/03/2014 1142       EXAM: CT CHEST, ABDOMEN, AND PELVIS WITH CONTRAST  TECHNIQUE: Multidetector CT imaging of the chest, abdomen and pelvis was performed  following the standard protocol during bolus administration of intravenous contrast.  CONTRAST: 122mL OMNIPAQUE IOHEXOL 300 MG/ML SOLN  COMPARISON: 08/24/2014.  FINDINGS: CT CHEST FINDINGS  Mediastinum/Nodes: Right IJ Port-A-Cath terminates in the low SVC. No pathologically enlarged mediastinal, hilar or axillary lymph nodes. Surgical clips in the right axilla. Coronary artery calcification. Heart size normal. No pericardial effusion. Tiny hiatal hernia.  Lungs/Pleura: Lungs are clear. No pleural fluid. Airway is unremarkable.  Musculoskeletal: No worrisome lytic or sclerotic lesions. Degenerative changes are seen in the spine.  CT ABDOMEN PELVIS FINDINGS  Hepatobiliary: Liver margin is irregular. Liver and gallbladder are otherwise unremarkable. No biliary ductal dilatation.  Pancreas: Negative.  Spleen: Negative.  Adrenals/Urinary Tract: Adrenal glands are unremarkable. 6 mm low-attenuation lesion in the lower pole right kidney is too small to characterize but statistically, likely represents a cyst. Ureters are decompressed. Bladder is low in volume.  Stomach/Bowel: Tiny hiatal hernia. Stomach, small bowel and colon are otherwise unremarkable.  Vascular/Lymphatic: Atherosclerotic calcification of  the arterial vasculature, without abdominal aortic aneurysm. No pathologically enlarged lymph nodes.  Reproductive: Uterus and right ovary are visualized.  Other: Omental caking along the ventral/lateral abdominal walls is likely stable (index lesion in the right lateral abdomen measures 2.9 x 8.2 cm, image 74, previously 2.4 x 7.6 cm). Implant adjacent to the gallbladder measures 2.3 cm (image 59), stable. No definite new lesions. Trace perihepatic ascites, stable.  Musculoskeletal: Small rounded lucent lesion in the L2 vertebral body is unchanged. No additional lytic or sclerotic lesions.  IMPRESSION: 1. Omental carcinomatosis appears  stable. 2. No metastatic disease in the chest. 3. Coronary artery calcification. 4. Marginal irregularity of the liver is indicative of cirrhosis.      Impression and Plan:  59 year old woman with the following issues:  1. Peritoneal carcinomatosis with omental involvement. The pathology confirmed the presence of adenocarcinoma of likely GYN etiology. Her CA 125 was elevated at 333.5 on 09/19/2012. She is status post systemic chemotherapy with an excellent response utilizing carboplatin and Taxotere and a Avastin . This therapy was held in June of 2015 and currently on Avastin maintenance.   CT scan obtained on 12/29/2014 was reviewed today and continue to show stable disease. She has tolerated this therapy well without any delayed complications. The plan is to continue on the same schedule and repeat imaging studies in 4-6 months. Difference of his therapy will be used upon symptomatic progression.   2. IV access: She had a Port-A-Cath inserted without any complications. no infection or bleeding.  3. Thrombocytopenia: Her platelets count is relatively stable without any bleeding.   4. Weight gain: No major changes in her weight at this time.  5. Nausea/diarrhea: She has antiemetics and antidiarrheal medication available.  6. The fatigue and tiredness: potentially related to Depakote. She is following up with psychiatry regarding this.   9. The followup: She will followup in 3 weeks for her next cycle of a Avastin.    [facility], MD 11/11/201612:12 PM

## 2014-12-31 NOTE — Telephone Encounter (Signed)
Gave patient avs report and appointments for December  °

## 2015-01-01 LAB — CA 125: CA 125: 65 U/mL — ABNORMAL HIGH (ref <35)

## 2015-01-03 ENCOUNTER — Encounter: Payer: Self-pay | Admitting: Podiatry

## 2015-01-03 ENCOUNTER — Ambulatory Visit (INDEPENDENT_AMBULATORY_CARE_PROVIDER_SITE_OTHER): Payer: BLUE CROSS/BLUE SHIELD | Admitting: Podiatry

## 2015-01-03 VITALS — BP 116/77 | HR 75 | Resp 16

## 2015-01-03 DIAGNOSIS — L600 Ingrowing nail: Secondary | ICD-10-CM | POA: Diagnosis not present

## 2015-01-03 NOTE — Patient Instructions (Signed)
Long Term Care Instructions-Post Nail Surgery  You have had your ingrown toenail and root treated with a chemical.  This chemical causes a burn that will drain and ooze like a blister.  This can drain for 6-8 weeks or longer.  It is important to keep this area clean, covered, and follow the soaking instructions dispensed at the time of your surgery.  This area will eventually dry and form a scab.  Once the scab forms you no longer need to soak or apply a dressing.  If at any time you experience an increase in pain, redness, swelling, or drainage, you should contact the office as soon as possible.ANTIBACTERIAL SOAP INSTRUCTIONS  THE DAY AFTER PROCEDURE  Please follow the instructions your doctor has marked.   Shower as usual. Before getting out, place a drop of antibacterial liquid soap (Dial) on a wet, clean washcloth.  Gently wipe washcloth over affected area.  Afterward, rinse the area with warm water.  Blot the area dry with a soft cloth and cover with antibiotic ointment (neosporin, polysporin, bacitracin) and band aid or gauze and tape  Place 3-4 drops of antibacterial liquid soap in a quart of warm tap water.  Submerge foot into water for 20 minutes.  If bandage was applied after your procedure, leave on to allow for easy lift off, then remove and continue with soak for the remaining time.  Next, blot area dry with a soft cloth and cover with a bandage.  Apply other medications as directed by your doctor, such as cortisporin otic solution (eardrops) or neosporin antibiotic ointment 

## 2015-01-03 NOTE — Progress Notes (Signed)
   Subjective:    Patient ID: [name], female    DOB: 06/10/1955, 59 y.o.   MRN: [id number]  HPI Pt presents with pain ful ingrown toenail lateral border left hallux   Review of Systems  Musculoskeletal: Positive for back pain and arthralgias.  All other systems reviewed and are negative.      Objective:   Physical Exam        Assessment & Plan:

## 2015-01-04 ENCOUNTER — Telehealth: Payer: Self-pay | Admitting: *Deleted

## 2015-01-04 NOTE — Telephone Encounter (Signed)
Left message for patient at [phone]) [phone] (Cell #) to check to see how they were doing from their ingrown toenail procedure that was performed on Monday, January 03, 2015. Waiting for a response.

## 2015-01-04 NOTE — Telephone Encounter (Signed)
Pt called and asked if she had to soak and cleanse in the shower, and for how long.  I told pt to choose the best for her lifestyle, and perform it 2 time daily for 2 weeks and cover with antibiotic ointment after each, then after 2 weeks and until the area got a dry hard scab to soak or cleanse once daily and cover with antibiotic ointment dressing when up and in enclosed shoe and allow to air dry when resting. Pt states understanding.

## 2015-01-04 NOTE — Telephone Encounter (Addendum)
Pt states she received call from [facility], and is doing fine has started the soaks, is a little sore, but otherwise okay.  Pt states she still has redness, and purplish around the toenail procedure and the redness at the bottom of the toe, and she has an appt today, no need to call if she still needs to come in.

## 2015-01-05 NOTE — Progress Notes (Signed)
Subjective:     Patient ID: [name], female   DOB: 05-07-1955, 59 y.o.   MRN: [id number]  HPI patient presents stating I been painful ingrown toenail my left big toe that I have tried to trim and soak without relief   Review of Systems     Objective:   Physical Exam Neurovascular status intact muscle strength adequate with incurvated left hallux lateral border that's painful when pressed and makes shoe gear difficult    Assessment:     Ingrown toenail deformity left hallux lateral border with pain    Plan:     H&P condition reviewed and patient at this time was recommended to have this ingrown toenail fixed. I discussed the surgery and explain the surgery and risk associated with this and patient wants this to be can care of and at this point I infiltrated the  left hallux 60 mg I can Marcaine mixture remove the lateral border exposed matrix and applied phenol 3 applications 30 seconds followed by alcohol lavage. Applied sterile dressing gave instructions on soaks and reappoint

## 2015-01-10 ENCOUNTER — Ambulatory Visit (INDEPENDENT_AMBULATORY_CARE_PROVIDER_SITE_OTHER): Payer: BLUE CROSS/BLUE SHIELD | Admitting: Podiatry

## 2015-01-10 ENCOUNTER — Encounter: Payer: Self-pay | Admitting: Podiatry

## 2015-01-10 VITALS — BP 164/94 | HR 76 | Resp 16

## 2015-01-10 DIAGNOSIS — L03032 Cellulitis of left toe: Secondary | ICD-10-CM

## 2015-01-10 DIAGNOSIS — L03012 Cellulitis of left finger: Secondary | ICD-10-CM

## 2015-01-10 MED ORDER — CEPHALEXIN 500 MG PO CAPS: 500.0000 mg | ORAL_CAPSULE | Freq: Two times a day (BID) | ORAL | Status: DC

## 2015-01-10 NOTE — Progress Notes (Signed)
Subjective:     Patient ID: [name], female   DOB: 08/21/1955, 59 y.o.   MRN: [id number]  HPI patient presents with redness on the lateral side of the left big toe with crusted tissue and increased pain over the last day. Soaking   Review of Systems     Objective:   Physical Exam Neurovascular status intact muscle strength adequate with ingrown toenail that's healed well left hallux with paronychia infection which is developed in the proximal portion of the nailbed with no proximal edema erythema drainage noted    Assessment:     Paronychia infection left hallux lateral side    Plan:     Advised on soaks therapy and placed on antibiotics cephalexin 5 number milligrams twice a day and discussed at one point were in the need to consider opening the area up if symptoms persist and gave strict instructions if any proximal edema erythema or drainage should occur to let us know immediately

## 2015-01-19 ENCOUNTER — Other Ambulatory Visit: Payer: Self-pay | Admitting: Oncology

## 2015-01-21 ENCOUNTER — Encounter (HOSPITAL_COMMUNITY): Payer: Self-pay

## 2015-01-21 ENCOUNTER — Other Ambulatory Visit (HOSPITAL_BASED_OUTPATIENT_CLINIC_OR_DEPARTMENT_OTHER): Payer: 59

## 2015-01-21 ENCOUNTER — Ambulatory Visit (HOSPITAL_COMMUNITY)
Admission: RE | Admit: 2015-01-21 | Discharge: 2015-01-21 | Disposition: A | Discharge status: Home / Self Care | Payer: 59 | Source: Ambulatory Visit | Attending: Nurse Practitioner | Admitting: Nurse Practitioner

## 2015-01-21 ENCOUNTER — Ambulatory Visit (HOSPITAL_BASED_OUTPATIENT_CLINIC_OR_DEPARTMENT_OTHER): Payer: 59 | Admitting: Oncology

## 2015-01-21 ENCOUNTER — Telehealth: Payer: Self-pay | Admitting: *Deleted

## 2015-01-21 ENCOUNTER — Ambulatory Visit (HOSPITAL_BASED_OUTPATIENT_CLINIC_OR_DEPARTMENT_OTHER): Payer: 59

## 2015-01-21 ENCOUNTER — Telehealth: Payer: Self-pay | Admitting: Oncology

## 2015-01-21 VITALS — BP 131/73 | HR 63 | Temp 97.5°F | Resp 18 | Ht 62.0 in | Wt 161.9 lb

## 2015-01-21 VITALS — BP 155/103 | HR 77 | Resp 20

## 2015-01-21 DIAGNOSIS — Z23 Encounter for immunization: Secondary | ICD-10-CM | POA: Diagnosis not present

## 2015-01-21 DIAGNOSIS — C50019 Malignant neoplasm of nipple and areola, unspecified female breast: Secondary | ICD-10-CM

## 2015-01-21 DIAGNOSIS — C786 Secondary malignant neoplasm of retroperitoneum and peritoneum: Secondary | ICD-10-CM

## 2015-01-21 DIAGNOSIS — M47812 Spondylosis without myelopathy or radiculopathy, cervical region: Secondary | ICD-10-CM | POA: Insufficient documentation

## 2015-01-21 DIAGNOSIS — D696 Thrombocytopenia, unspecified: Secondary | ICD-10-CM

## 2015-01-21 DIAGNOSIS — C801 Malignant (primary) neoplasm, unspecified: Secondary | ICD-10-CM

## 2015-01-21 DIAGNOSIS — C50919 Malignant neoplasm of unspecified site of unspecified female breast: Secondary | ICD-10-CM | POA: Insufficient documentation

## 2015-01-21 DIAGNOSIS — C569 Malignant neoplasm of unspecified ovary: Secondary | ICD-10-CM

## 2015-01-21 LAB — CBC WITH DIFFERENTIAL/PLATELET
BASO%: 0.8 % (ref 0.0–2.0)
Basophils Absolute: 0 10e3/uL (ref 0.0–0.1)
EOS%: 4.3 % (ref 0.0–7.0)
Eosinophils Absolute: 0.2 10e3/uL (ref 0.0–0.5)
HCT: 37.6 % (ref 34.8–46.6)
HGB: 12 g/dL (ref 11.6–15.9)
LYMPH%: 36.9 % (ref 14.0–49.7)
MCH: 29.6 pg (ref 25.1–34.0)
MCHC: 31.9 g/dL (ref 31.5–36.0)
MCV: 92.8 fL (ref 79.5–101.0)
MONO#: 0.4 10e3/uL (ref 0.1–0.9)
MONO%: 11.3 % (ref 0.0–14.0)
NEUT#: 1.7 10e3/uL (ref 1.5–6.5)
NEUT%: 46.7 % (ref 38.4–76.8)
Platelets: 87 10e3/uL — ABNORMAL LOW (ref 145–400)
RBC: 4.05 10e6/uL (ref 3.70–5.45)
RDW: 15.3 % — ABNORMAL HIGH (ref 11.2–14.5)
WBC: 3.6 10e3/uL — ABNORMAL LOW (ref 3.9–10.3)
lymph#: 1.3 10e3/uL (ref 0.9–3.3)

## 2015-01-21 LAB — COMPREHENSIVE METABOLIC PANEL WITH GFR
ALT: 11 U/L (ref 0–55)
AST: 22 U/L (ref 5–34)
Albumin: 3.5 g/dL (ref 3.5–5.0)
Alkaline Phosphatase: 71 U/L (ref 40–150)
Anion Gap: 7 meq/L (ref 3–11)
BUN: 20.9 mg/dL (ref 7.0–26.0)
CO2: 27 meq/L (ref 22–29)
Calcium: 9.3 mg/dL (ref 8.4–10.4)
Chloride: 107 meq/L (ref 98–109)
Creatinine: 0.8 mg/dL (ref 0.6–1.1)
EGFR: 82 ml/min/1.73 m2 — ABNORMAL LOW
Glucose: 80 mg/dL (ref 70–140)
Potassium: 4.7 meq/L (ref 3.5–5.1)
Sodium: 140 meq/L (ref 136–145)
Total Bilirubin: 0.43 mg/dL (ref 0.20–1.20)
Total Protein: 7.8 g/dL (ref 6.4–8.3)

## 2015-01-21 LAB — CA 125: CA 125: 66 U/mL — ABNORMAL HIGH

## 2015-01-21 LAB — UA PROTEIN, DIPSTICK - CHCC: Protein, ur: NEGATIVE mg/dL

## 2015-01-21 MED ORDER — HEPARIN SOD (PORK) LOCK FLUSH 100 UNIT/ML IV SOLN
500.0000 [IU] | Freq: Once | INTRAVENOUS | Status: AC | PRN
  Administered 2015-01-21: 500 [IU]
  Filled 2015-01-21: qty 5

## 2015-01-21 MED ORDER — SODIUM CHLORIDE 0.9 % IV SOLN: 15.0000 mg/kg | Freq: Once | INTRAVENOUS | Status: DC

## 2015-01-21 MED ORDER — SODIUM CHLORIDE 0.9 % IV SOLN
Freq: Once | INTRAVENOUS | Status: AC
  Administered 2015-01-21: 11:00:00 via INTRAVENOUS

## 2015-01-21 MED ORDER — SODIUM CHLORIDE 0.9 % IJ SOLN
10.0000 mL | INTRAMUSCULAR | Status: DC | PRN
  Administered 2015-01-21: 10 mL
  Filled 2015-01-21: qty 10

## 2015-01-21 MED ORDER — INFLUENZA VAC SPLIT QUAD 0.5 ML IM SUSY
0.5000 mL | PREFILLED_SYRINGE | Freq: Once | INTRAMUSCULAR | Status: AC
  Administered 2015-01-21: 0.5 mL via INTRAMUSCULAR
  Filled 2015-01-21: qty 0.5

## 2015-01-21 NOTE — Progress Notes (Signed)
Pt entered center to have port-a-cath access flushed and removed. Pt denies dizziness, visual distortion, or altered mental status. Pt left accompanied by husband.

## 2015-01-21 NOTE — Telephone Encounter (Signed)
TC to pt to rtn to [facility] to have port de-accessed. Pt fell in treatment room and sent for CT scan. Port was not de-accessed. LM on both numbers provided in the chart to have pt rtn to [facility].

## 2015-01-21 NOTE — Telephone Encounter (Signed)
per pof to sch pt appt-sent MW emailto sch trmt-pt to getupdated copy b4 leaving** °

## 2015-01-21 NOTE — Progress Notes (Signed)
Hematology and Oncology Follow Up Visit  [name] [id number] [birth year] 59 y.o. 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 This was diagnosed in July of 2014.   Prior Therapy:  She is status post paracentesis performed on 09/17/2012 with the cytology confirmed the presence of adenocarcinoma and immunohistochemical stains suggest GYN etiology. She is also status post lumpectomy for breast cancer diagnosed 25 years ago followed by radiation and chemotherapy under the care of Dr. Sonny Dandy. She did not receive any hormonal therapy. Chemotherapy utilizing carboplatin and Taxotere started on 10/01/2012. Avastin was added with cycle 4. This was discontinued in June of 2015.  Current therapy:  She is currently on a Avastin maintenance only after chemotherapy was discontinued in June of 2015.  Interim History: [name] presents today for a followup visit with her husband. Since her last visit, she continues to be relatively stable. She does report occasional fatigue which has improved for the most part. She remains very active in her church, Christmas preparation and shopping. She is able to perform activities of daily living without any hindrance or decline.   She has tolerated Avastin without any new complications. She has not reported any infusion-related complications. Has not reported any bleeding issues. She has not reported increase in her blood pressure or thrombosis. She has not reported any abdominal distention or early satiety. Has not reported any weight loss or change in her appetite.  She does not report any headaches, blurry vision, syncope or seizures. She denied any fever chills. Has not reported any shortness of breath or cough. She denied any pain with swallowing. She has reported also grade 1 neuropathy which remains stable.  She has not any  shortness of breath or chest pain. She denied any GYN bleeding. She has not reported any increase in her abdominal pain or early satiety. She also reports no hematochezia or melena.  Her mood is excellent without any depression or anxiety. Remainder of the review of systems is unremarkable.  Medications: I have reviewed the patient's current medications.  Current Outpatient Prescriptions  Medication Sig Dispense Refill  . ALPRAZolam (XANAX) 1 MG tablet Take 1 mg by mouth at bedtime as needed for anxiety.    . betamethasone dipropionate (DIPROLENE) 0.05 % cream Apply topically 2 (two) times daily. (Patient taking differently: Apply topically as needed. ) 30 g 0  . calcium carbonate (TUMS - DOSED IN MG ELEMENTAL CALCIUM) 500 MG chewable tablet Chew 1 tablet by mouth as needed for indigestion or heartburn.    . cephALEXin (KEFLEX) 500 MG capsule Take 1 capsule (500 mg total) by mouth 2 (two) times daily. 20 capsule 1  . divalproex (DEPAKOTE) 250 MG DR tablet Take 250 mg by mouth 3 (three) times daily. Pt takes Depakote DR 250 mg, 2 tablets in the morning and 3 tablets in the evening    . gabapentin (NEURONTIN) 100 MG capsule TAKE ONE CAPSULE BY MOUTH THREE TIMES A DAY 90 capsule 1  . gabapentin (NEURONTIN) 100 MG capsule TAKE ONE CAPSULE BY MOUTH THREE TIMES A DAY 90 capsule 1  . lamoTRIgine (LAMICTAL) 200 MG tablet Take 200 mg by mouth at bedtime.  1  . levothyroxine (SYNTHROID, LEVOTHROID) 100 MCG tablet Take 100 mcg by mouth daily before breakfast.     . lidocaine-prilocaine (EMLA) cream Apply topically as needed. Apply to port with every chemotherapy. 30 g  1  . loperamide (IMODIUM A-D) 2 MG tablet Take 2 mg by mouth as needed for diarrhea or loose stools.    Marland Kitchen omeprazole (PRILOSEC) 10 MG capsule Take 10 mg by mouth daily.    . ondansetron (ZOFRAN ODT) 8 MG disintegrating tablet Take 1 tablet (8 mg total) by mouth every 8 (eight) hours as needed for nausea or vomiting. 20 tablet 0  . rOPINIRole  (REQUIP) 1 MG tablet Take 1 mg by mouth at bedtime.     No current facility-administered medications for this visit.     Allergies:  Allergies  Allergen Reactions  . Pollen Extract Other (See Comments)    Pollen and grass causes a lot sneezing    Past Medical History, Surgical history, Social history, and Family History were reviewed and updated.   Physical Exam: Blood pressure 131/73, pulse 63, temperature 97.5 F (36.4 C), temperature source Oral, resp. rate 18, height 5\' 2"  (1.575 m), weight 161 lb 14.4 oz (73.437 kg), SpO2 100 %.  ECOG: 1 General appearance: awake alert without distress. Head: Normocephalic, without obvious abnormality. No oral ulcers or lesions. Neck: no adenopathy Lymph nodes: Cervical, supraclavicular, and axillary nodes normal. Heart:regular rate and rhythm, S1, S2 normal, no murmur, click, rub or gallop Lung:chest clear, no wheezing, rales, normal symmetric air entry Abdomin: Soft, nontender with good bowel sounds. No rebound or guarding. EXT:no erythema, induration, or nodules. . Skin: No rashes or lesions.  Lab Results: Lab Results  Component Value Date   WBC 3.6* 01/21/2015   HGB 12.0 01/21/2015   HCT 37.6 01/21/2015   MCV 92.8 01/21/2015   PLT 87* 01/21/2015     Chemistry      Component Value Date/Time   NA 141 12/31/2014 1126   NA 143 06/03/2014 1142   K 4.8 12/31/2014 1126   K 4.3 06/03/2014 1142   CL 107 06/03/2014 1142   CO2 29 12/31/2014 1126   CO2 29 06/03/2014 1142   BUN 15.4 12/31/2014 1126   BUN 15 06/03/2014 1142   CREATININE 0.8 12/31/2014 1126   CREATININE 0.74 06/03/2014 1142      Component Value Date/Time   CALCIUM 9.3 12/31/2014 1126   CALCIUM 8.8 06/03/2014 1142   ALKPHOS 58 12/31/2014 1126   ALKPHOS 57 06/03/2014 1142   AST 19 12/31/2014 1126   AST 21 06/03/2014 1142   ALT 11 12/31/2014 1126   ALT 13 06/03/2014 1142   BILITOT 0.35 12/31/2014 1126   BILITOT 0.2* 06/03/2014 1142         Impression  and Plan:  59 year old woman with the following issues:  1. Peritoneal carcinomatosis with omental involvement. The pathology confirmed the presence of adenocarcinoma of likely GYN etiology. Her CA 125 was elevated at 333.5 on 09/19/2012. She is status post systemic chemotherapy with an excellent response utilizing carboplatin and Taxotere and a Avastin . This therapy was held in June of 2015 and currently on Avastin maintenance.   CT scan obtained on 12/29/2014 was reviewed again and continues to show stable disease. She has tolerated this therapy well without any delayed complications. The plan is to continue on the same schedule and repeat imaging studies in 4 months.  2. IV access: She had a Port-A-Cath inserted without any complications. He has no complaints at this time.  3. Thrombocytopenia: Her platelets count is slightly better without any bleeding.  4. Weight gain: No major changes in her weight at this time. Her appetite is excellent.  5. Nausea/diarrhea: She has  antiemetics and antidiarrheal medication available. No major changes in her bowel habits.  6. The fatigue and tiredness: potentially related to Depakote. She is following up with psychiatry regarding this. Her energy has improved at this time.  7. The followup: She will followup in 3 weeks for her next cycle of a Avastin.    [id number], MD 12/2/201610:38 AM

## 2015-01-21 NOTE — Telephone Encounter (Signed)
Per staff message and POF I have scheduled appts. Advised scheduler of appts. JMW  

## 2015-01-21 NOTE — Progress Notes (Signed)
Addendum: Patient was in the infusion center bathroom attempting to give a urine sample; and became mildly dizzy and actually fell.  When she fell-patient hit the frontal region of her for head into the bathroom wall.  Patient denies any loss of consciousness.  Patient is complaining of some mild tenderness with palpation to her for head; and some mildly increased neck pain as well.  Patient states she has a history of chronic neck pain already.  She denies any back pain, numbness/tingling to any for extremities, or other symptoms.  Lift machine was used to assist patient from floor into wheelchair for further exam.  Patient was transported to radiology for a stat head CT and cervical spine CT without contrast.  CT scan results were negative for any acute findings.  Reviewed all findings with Dr. Alen Blew today; and decision was made to hold the Avastin today.  Patient and her husband were advised to go directly to the emergency department over the weekend if she develops any persistent or worsening neurological symptoms whatsoever.  Patient has plans to return to the [facility] on 02/11/2015 for labs, visit, and her next Avastin infusion.

## 2015-01-21 NOTE — Patient Instructions (Signed)
[facility] Discharge Instructions for Patients Receiving Chemotherapy  Today you received the following chemotherapy agents Avastin To help prevent nausea and vomiting after your treatment, we encourage you to take your nausea medication as prescribed.   If you develop nausea and vomiting that is not controlled by your nausea medication, call the clinic.   BELOW ARE SYMPTOMS THAT SHOULD BE REPORTED IMMEDIATELY:  *FEVER GREATER THAN 100.5 F  *CHILLS WITH OR WITHOUT FEVER  NAUSEA AND VOMITING THAT IS NOT CONTROLLED WITH YOUR NAUSEA MEDICATION  *UNUSUAL SHORTNESS OF BREATH  *UNUSUAL BRUISING OR BLEEDING  TENDERNESS IN MOUTH AND THROAT WITH OR WITHOUT PRESENCE OF ULCERS  *URINARY PROBLEMS  *BOWEL PROBLEMS  UNUSUAL RASH Items with * indicate a potential emergency and should be followed up as soon as possible.  Feel free to call the clinic you have any questions or concerns. The clinic phone number is [phone].  Please show the CHEMO ALERT CARD at check-in to the Emergency Department and triage nurse.   

## 2015-01-21 NOTE — Progress Notes (Signed)
Labs reviewed with MD,OK to treat per MD  1210 Pt found in bathroom sitting position on floor. Pt states "I was bending over to check urine hat when fell forward on her knees and slid onto floor and hit my head." Hole at bottom right side of doorway observed. VS taken BP 155/103 rr 20 HR 77.  Cyndee bacon, NP called to assess pt. Swelling to head, with redness c/o pain to head, neck and shoulder blades.  Pt could not get up, hoyer lift used to raise pt off floor, transferred to wheelchair, Pt tearful  From discomfort and embarrassment. Stat CT ordered , pt taken to Radiology. VO to D/C any treatment. Pharmacy notified.

## 2015-01-25 ENCOUNTER — Other Ambulatory Visit: Payer: BLUE CROSS/BLUE SHIELD

## 2015-01-25 ENCOUNTER — Ambulatory Visit: Payer: BLUE CROSS/BLUE SHIELD | Admitting: Oncology

## 2015-02-09 NOTE — Progress Notes (Signed)
This encounter was created in error - please disregard.

## 2015-02-11 ENCOUNTER — Telehealth: Payer: Self-pay | Admitting: Oncology

## 2015-02-11 ENCOUNTER — Ambulatory Visit (HOSPITAL_BASED_OUTPATIENT_CLINIC_OR_DEPARTMENT_OTHER): Payer: 59

## 2015-02-11 ENCOUNTER — Telehealth: Payer: Self-pay | Admitting: *Deleted

## 2015-02-11 ENCOUNTER — Ambulatory Visit (HOSPITAL_BASED_OUTPATIENT_CLINIC_OR_DEPARTMENT_OTHER): Payer: 59 | Admitting: Oncology

## 2015-02-11 ENCOUNTER — Other Ambulatory Visit (HOSPITAL_BASED_OUTPATIENT_CLINIC_OR_DEPARTMENT_OTHER): Payer: 59

## 2015-02-11 ENCOUNTER — Other Ambulatory Visit (HOSPITAL_COMMUNITY)
Admission: AD | Admit: 2015-02-11 | Discharge: 2015-02-11 | Disposition: A | Discharge status: Home / Self Care | Payer: 59 | Source: Ambulatory Visit | Attending: Oncology | Admitting: Oncology

## 2015-02-11 VITALS — BP 142/93 | HR 80 | Temp 98.2°F | Resp 18 | Ht 62.0 in | Wt 161.2 lb

## 2015-02-11 VITALS — BP 128/70 | HR 70

## 2015-02-11 DIAGNOSIS — C801 Malignant (primary) neoplasm, unspecified: Secondary | ICD-10-CM | POA: Diagnosis not present

## 2015-02-11 DIAGNOSIS — Z5112 Encounter for antineoplastic immunotherapy: Secondary | ICD-10-CM | POA: Diagnosis not present

## 2015-02-11 DIAGNOSIS — C50919 Malignant neoplasm of unspecified site of unspecified female breast: Secondary | ICD-10-CM | POA: Insufficient documentation

## 2015-02-11 DIAGNOSIS — D696 Thrombocytopenia, unspecified: Secondary | ICD-10-CM

## 2015-02-11 DIAGNOSIS — C569 Malignant neoplasm of unspecified ovary: Secondary | ICD-10-CM | POA: Diagnosis not present

## 2015-02-11 DIAGNOSIS — C786 Secondary malignant neoplasm of retroperitoneum and peritoneum: Secondary | ICD-10-CM | POA: Diagnosis not present

## 2015-02-11 DIAGNOSIS — R42 Dizziness and giddiness: Secondary | ICD-10-CM

## 2015-02-11 LAB — COMPREHENSIVE METABOLIC PANEL
ALT: 18 U/L (ref 14–54)
AST: 31 U/L (ref 15–41)
Albumin: 3.9 g/dL (ref 3.5–5.0)
Alkaline Phosphatase: 69 U/L (ref 38–126)
Anion gap: 9 (ref 5–15)
BILIRUBIN TOTAL: 0.5 mg/dL (ref 0.3–1.2)
BUN: 16 mg/dL (ref 6–20)
CO2: 30 mmol/L (ref 22–32)
CREATININE: 0.69 mg/dL (ref 0.44–1.00)
Calcium: 10 mg/dL (ref 8.9–10.3)
Chloride: 104 mmol/L (ref 101–111)
GFR calc Af Amer: >60 mL/min (ref >60)
Glucose, Bld: 114 mg/dL — ABNORMAL HIGH (ref 65–99)
Potassium: 4.2 mmol/L (ref 3.5–5.1)
Sodium: 143 mmol/L (ref 135–145)
TOTAL PROTEIN: 8 g/dL (ref 6.5–8.1)

## 2015-02-11 LAB — CBC WITH DIFFERENTIAL/PLATELET
BASO%: 0.7 % (ref 0.0–2.0)
Basophils Absolute: 0 10*3/uL (ref 0.0–0.1)
EOS%: 4 % (ref 0.0–7.0)
Eosinophils Absolute: 0.2 10*3/uL (ref 0.0–0.5)
HEMATOCRIT: 39.5 % (ref 34.8–46.6)
HEMOGLOBIN: 12.4 g/dL (ref 11.6–15.9)
LYMPH#: 1.7 10*3/uL (ref 0.9–3.3)
LYMPH%: 39.3 % (ref 14.0–49.7)
MCH: 29 pg (ref 25.1–34.0)
MCHC: 31.3 g/dL — ABNORMAL LOW (ref 31.5–36.0)
MCV: 92.6 fL (ref 79.5–101.0)
MONO#: 0.5 10*3/uL (ref 0.1–0.9)
MONO%: 11.1 % (ref 0.0–14.0)
NEUT%: 44.9 % (ref 38.4–76.8)
NEUTROS ABS: 1.9 10*3/uL (ref 1.5–6.5)
Platelets: 95 10*3/uL — ABNORMAL LOW (ref 145–400)
RBC: 4.27 10*6/uL (ref 3.70–5.45)
RDW: 15.5 % — AB (ref 11.2–14.5)
WBC: 4.2 10*3/uL (ref 3.9–10.3)

## 2015-02-11 LAB — CA 125: CA 125: 69 U/mL — AB (ref <35)

## 2015-02-11 MED ORDER — BEVACIZUMAB CHEMO INJECTION 400 MG/16ML
14.5000 mg/kg | Freq: Once | INTRAVENOUS | Status: AC
  Administered 2015-02-11: 1100 mg via INTRAVENOUS
  Filled 2015-02-11: qty 32

## 2015-02-11 MED ORDER — HEPARIN SOD (PORK) LOCK FLUSH 100 UNIT/ML IV SOLN
500.0000 [IU] | Freq: Once | INTRAVENOUS | Status: AC | PRN
  Administered 2015-02-11: 500 [IU]
  Filled 2015-02-11: qty 5

## 2015-02-11 MED ORDER — SODIUM CHLORIDE 0.9 % IJ SOLN
10.0000 mL | INTRAMUSCULAR | Status: DC | PRN
  Administered 2015-02-11: 10 mL
  Filled 2015-02-11: qty 10

## 2015-02-11 MED ORDER — SODIUM CHLORIDE 0.9 % IV SOLN
Freq: Once | INTRAVENOUS | Status: AC
  Administered 2015-02-11: 10:00:00 via INTRAVENOUS

## 2015-02-11 NOTE — Telephone Encounter (Signed)
per pof to sch pt appt-gave pt copyo f avs-adv pt [facility] will call to sch appt in workque

## 2015-02-11 NOTE — Telephone Encounter (Signed)
Per staff message and POF I have scheduled appts. Advised scheduler of appts. JMW  

## 2015-02-11 NOTE — Patient Instructions (Signed)
[facility] Discharge Instructions for Patients Receiving Chemotherapy  Today you received the following chemotherapy agents: Avastin  To help prevent nausea and vomiting after your treatment, we encourage you to take your nausea medication. If you develop nausea and vomiting that is not controlled by your nausea medication, call the clinic.   BELOW ARE SYMPTOMS THAT SHOULD BE REPORTED IMMEDIATELY:  *FEVER GREATER THAN 100.5 F  *CHILLS WITH OR WITHOUT FEVER  NAUSEA AND VOMITING THAT IS NOT CONTROLLED WITH YOUR NAUSEA MEDICATION  *UNUSUAL SHORTNESS OF BREATH  *UNUSUAL BRUISING OR BLEEDING  TENDERNESS IN MOUTH AND THROAT WITH OR WITHOUT PRESENCE OF ULCERS  *URINARY PROBLEMS  *BOWEL PROBLEMS  UNUSUAL RASH Items with * indicate a potential emergency and should be followed up as soon as possible.  Feel free to call the clinic you have any questions or concerns. The clinic phone number is [phone].  Please show the CHEMO ALERT CARD at check-in to the Emergency Department and triage nurse.   

## 2015-02-11 NOTE — Addendum Note (Signed)
Addended by: Randolm Idol on: 02/11/2015 09:58 AM   Modules accepted: Orders, Medications

## 2015-02-11 NOTE — Progress Notes (Signed)
Hematology and Oncology Follow Up Visit  [name] [id number] 1955-05-03 59 y.o. 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 This was diagnosed in July of 2014.   Prior Therapy:  She is status post paracentesis performed on 09/17/2012 with the cytology confirmed the presence of adenocarcinoma and immunohistochemical stains suggest GYN etiology. She is also status post lumpectomy for breast cancer diagnosed 25 years ago followed by radiation and chemotherapy under the care of Dr. Sonny Padilla. She did not receive any hormonal therapy. Chemotherapy utilizing carboplatin and Taxotere started on 10/01/2012. Avastin was added with cycle 4. This was discontinued in June of 2015.  Current therapy:  She is currently on a Avastin maintenance only. Chemotherapy was discontinued in June of 2015.  Interim History: [name] presents today for a followup visit with her husband. Since her last visit, she developed a presyncopal episode before she received the last chemotherapy and sustained a fall. She hit her head and neck but no injury noted. She had a CT scan of the head and neck on December 2 and the results showed no fractures. She has no residual issues from that fall but she continues to have some dizziness and unsteadiness that caused her to fall. It is predominantly positional when she moves from a sitting to standing position.   No other complaints or complications related to a Avastin. She has not reported any bleeding issues. She has not reported any epistaxis, hematochezia or melena. She has not reported any infusion-related complications. Has not reported any bleeding issues. She has not reported increase in her blood pressure or thrombosis. She has not reported any abdominal distention or early satiety. Has not reported any weight loss or change in her  appetite.  She does not report any headaches, blurry vision, syncope or seizures. She denied any fever chills. Has not reported any shortness of breath or cough. She denied any pain with swallowing. She has reported also grade 1 neuropathy which remains stable.  She has not any shortness of breath or chest pain. She denied any GYN bleeding. She has not reported any increase in her abdominal pain or early satiety. She also reports no hematochezia or melena.  Her mood is excellent without any depression or anxiety. Remainder of the review of systems is unremarkable.  Medications: I have reviewed the patient's current medications.  Current Outpatient Prescriptions  Medication Sig Dispense Refill  . ALPRAZolam (XANAX) 1 MG tablet Take 1 mg by mouth at bedtime as needed for anxiety.    . betamethasone dipropionate (DIPROLENE) 0.05 % cream Apply topically 2 (two) times daily. (Patient taking differently: Apply topically as needed. ) 30 g 0  . calcium carbonate (TUMS - DOSED IN MG ELEMENTAL CALCIUM) 500 MG chewable tablet Chew 1 tablet by mouth as needed for indigestion or heartburn.    . cephALEXin (KEFLEX) 500 MG capsule Take 1 capsule (500 mg total) by mouth 2 (two) times daily. 20 capsule 1  . divalproex (DEPAKOTE) 250 MG DR tablet Take 250 mg by mouth 3 (three) times daily. Pt takes Depakote DR 250 mg, 2 tablets in the morning and 3 tablets in the evening    . gabapentin (NEURONTIN) 100 MG capsule TAKE ONE CAPSULE BY MOUTH THREE TIMES A DAY 90 capsule 1  . gabapentin (NEURONTIN) 100 MG capsule TAKE ONE CAPSULE BY MOUTH THREE TIMES A DAY 90 capsule 1  .  lamoTRIgine (LAMICTAL) 200 MG tablet Take 200 mg by mouth at bedtime.  1  . levothyroxine (SYNTHROID, LEVOTHROID) 100 MCG tablet Take 100 mcg by mouth daily before breakfast.     . lidocaine-prilocaine (EMLA) cream Apply topically as needed. Apply to port with every chemotherapy. 30 g 1  . loperamide (IMODIUM A-D) 2 MG tablet Take 2 mg by mouth as  needed for diarrhea or loose stools.    Marland Kitchen omeprazole (PRILOSEC) 10 MG capsule Take 10 mg by mouth daily.    . ondansetron (ZOFRAN ODT) 8 MG disintegrating tablet Take 1 tablet (8 mg total) by mouth every 8 (eight) hours as needed for nausea or vomiting. 20 tablet 0  . rOPINIRole (REQUIP) 1 MG tablet Take 1 mg by mouth at bedtime.     No current facility-administered medications for this visit.     Allergies:  Allergies  Allergen Reactions  . Pollen Extract Other (See Comments)    Pollen and grass causes a lot sneezing    Past Medical History, Surgical history, Social history, and Family History were reviewed and updated.   Physical Exam: Blood pressure 142/93, pulse 80, temperature 98.2 F (36.8 C), temperature source Oral, resp. rate 18, height 5\' 2"  (1.575 m), weight 161 lb 3.2 oz (73.12 kg), SpO2 99 %.  ECOG: 1 General appearance: awake alert without distress. Head: Normocephalic, without obvious abnormality. No oral ulcers or lesions. Neck: no adenopathy Lymph nodes: Cervical, supraclavicular, and axillary nodes normal. Heart:regular rate and rhythm, S1, S2 normal, no murmur, click, rub or gallop Lung:chest clear, no wheezing, rales, normal symmetric air entry Abdomin: Soft, nontender with good bowel sounds. No rebound or guarding. EXT:no erythema, induration, or nodules. . Skin: No rashes or lesions.  Lab Results: Lab Results  Component Value Date   WBC 4.2 02/11/2015   HGB 12.4 02/11/2015   HCT 39.5 02/11/2015   MCV 92.6 02/11/2015   PLT 95* 02/11/2015     Chemistry      Component Value Date/Time   NA 140 01/21/2015 1015   NA 143 06/03/2014 1142   K 4.7 01/21/2015 1015   K 4.3 06/03/2014 1142   CL 107 06/03/2014 1142   CO2 27 01/21/2015 1015   CO2 29 06/03/2014 1142   BUN 20.9 01/21/2015 1015   BUN 15 06/03/2014 1142   CREATININE 0.8 01/21/2015 1015   CREATININE 0.74 06/03/2014 1142      Component Value Date/Time   CALCIUM 9.3 01/21/2015 1015    CALCIUM 8.8 06/03/2014 1142   ALKPHOS 71 01/21/2015 1015   ALKPHOS 57 06/03/2014 1142   AST 22 01/21/2015 1015   AST 21 06/03/2014 1142   ALT 11 01/21/2015 1015   ALT 13 06/03/2014 1142   BILITOT 0.43 01/21/2015 1015   BILITOT 0.2* 06/03/2014 1142         Impression and Plan:  59 year old woman with the following issues:  1. Peritoneal carcinomatosis with omental involvement. The pathology confirmed the presence of adenocarcinoma of likely GYN etiology. Her CA 125 was elevated at 333.5 on 09/19/2012. She is status post systemic chemotherapy with an excellent response utilizing carboplatin and Taxotere and a Avastin . This therapy was held in June of 2015 and currently on Avastin maintenance.   CT scan obtained on 12/29/2014 was reviewed again and continues to show stable disease. The plan is to continue with maintenance therapy and change to a different systemic chemotherapy upon symptomatic progression.  2. IV access: She had a Port-A-Cath is in place  without any complications since the last visit.  3. Thrombocytopenia: Her platelets count today is to improve without any bleeding complications. No transfusions growth factor support needed.  4. Weight gain: No major changes in her weight at this time. He continues to have good appetite.  5. Nausea/diarrhea: She has antiemetics and antidiarrheal medication available. No major changes in her bowel habits.  6. Presyncopal episode with possible positional vertigo: Unclear etiology could be BPV or orthostasis. I will refer her to neurology for evaluation regarding this issue.  7. The followup: She will followup in 3 weeks for her next cycle of a Avastin.    [facility], MD 12/23/20169:18 AM

## 2015-03-03 ENCOUNTER — Ambulatory Visit (INDEPENDENT_AMBULATORY_CARE_PROVIDER_SITE_OTHER): Payer: PPO | Admitting: Neurology

## 2015-03-03 ENCOUNTER — Encounter: Payer: Self-pay | Admitting: Neurology

## 2015-03-03 VITALS — BP 132/84 | HR 77 | Ht 62.0 in | Wt 161.4 lb

## 2015-03-03 DIAGNOSIS — T887XXA Unspecified adverse effect of drug or medicament, initial encounter: Secondary | ICD-10-CM | POA: Diagnosis not present

## 2015-03-03 DIAGNOSIS — W19XXXA Unspecified fall, initial encounter: Secondary | ICD-10-CM

## 2015-03-03 DIAGNOSIS — R42 Dizziness and giddiness: Secondary | ICD-10-CM | POA: Diagnosis not present

## 2015-03-03 DIAGNOSIS — M542 Cervicalgia: Secondary | ICD-10-CM | POA: Diagnosis not present

## 2015-03-03 DIAGNOSIS — R270 Ataxia, unspecified: Secondary | ICD-10-CM

## 2015-03-03 DIAGNOSIS — T451X5A Adverse effect of antineoplastic and immunosuppressive drugs, initial encounter: Secondary | ICD-10-CM

## 2015-03-03 NOTE — Progress Notes (Signed)
[facility]    Provider:  Dr Jaynee Eagles Referring Provider: Carlos Levering, PA-C Primary Care Physician:  Wynelle Fanny  CC:  dizziness  HPI:  Julie BERRO is a 60 y.o. female here as a referral from Dr. Percell Miller for dizziness. 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 This was diagnosed in July of 2014.She is also status post lumpectomy for breast cancer diagnosed 25 years ago followed by radiation and chemotherapy carboplatin and Taxotere started on 10/01/2012. Avastin was added with cycle 4. This was discontinued in June of 2015.   She has been having dizziness and trouble with balance for a while. She was advised to get a cane and has not. The dizziness started after chemotherapy, maybe last fall. In December she fell at the cancer center, she had her pole with her, she bent over and she lost balance and slid and hit her head. She hit her head and neck but no injury noted. She had a CT scan of the head and neck on December 2 and the results showed no fractures.She denies vertigo, no room spinning, more lightheadedness. She felt dizzy. She hit her head really hard, there was a hole in the wall. No LOC, no memory changes, no knots or bleeding or any visible head trauma. Since then she is more afraid to fall but the dizziness and lightheadedness has not worsened, it is stable as compared to pre-fall.  She has no residual issues from that fall but she continues to have some dizziness and unsteadiness that caused her to fall. She feels lightheaded when she is walking or standing. Sometimes when she initially gets up. She has soreness in the thumbs since chemo and she is dropping things. She also has significant neck pain since chemotherapy.   Reviewed notes, labs and imaging from outside physicians, which showed:  CT of the head 01/2015:personally reviewed images and agree with the  following:  CT HEAD FINDINGS  Advanced for age cortical atrophy and chronic microvascular  change are seen. No evidence of acute intracranial abnormality  including hemorrhage, infarct, mass lesion, mass effect, midline shift or abnormal extra-axial fluid collection is seen. There is no hydrocephalus or pneumocephalus. Mucosal thickening right sphenoid sinus is noted, unchanged. Imaged paranasal sinuses are otherwise clear. The calvarium is intact.  CT CERVICAL SPINE FINDINGS  No fracture or malalignment of the cervical spine is identified. There is loss of disc space height at all levels. Marked multilevel acet degenerative change is identified. The right C2-3 and left C2-3 and C3-4 facets are ankylosed. Lung apices are clear. Paraspinous soft tissue structures are unremarkable.  IMPRESSION: No acute abnormality head or cervical spine.  Advanced for age cortical atrophy and chronic microvascular ischemic change.  Multilevel cervical spondylosis.  CMP unremarkable, CBC with platelets 95.   Review of Systems: Patient complains of symptoms per HPI as well as the following symptoms: No CP, no SOB. Pertinent negatives per HPI. All others negative.   Social History   Social History  . Marital Status: Married    Spouse Name: [name]  . Number of Children: 0  . Years of Education: 14   Occupational History  . unemployed    Social History Main Topics  . Smoking status: Never Smoker   . Smokeless tobacco: Never Used  . Alcohol Use: 0.0 oz/week    0 Standard drinks or equivalent per week     Comment: very rarely  .  Drug Use: No  . Sexual Activity: No   Other Topics Concern  . Not on file   Social History Narrative   Lives at home with husband.    Caffeine use:  Tea/soda occass    Family History  Problem Relation Age of Onset  . Breast cancer Maternal Aunt 30  . Diabetes Maternal Grandmother   . Heart disease Maternal Grandmother   . Heart disease Maternal  Grandfather   . Hypertension Paternal Grandfather   . Breast cancer Maternal Aunt 70  . Breast cancer Maternal Aunt 85  . Ovarian cancer Neg Hx   . Colon cancer Neg Hx   . Allergies Father   . Heart disease Mother     Past Medical History  Diagnosis Date  . Premature menopause   . Osteopenia 03/2009    t score -2.1 FRAX 4.6/0.4  . Hypothyroidism   . Depression   . Anxiety   . Hyperlipidemia   . Bipolar disorder ([place])   . ADD (attention deficit disorder)   . Hematochezia   . Allergy     seasonal  . Maintenance chemotherapy     Pt has chemo every 3 weeks (on Friday)  . Sleep apnea     mild  . Breast cancer ([place]) 1989    right breast  . Diabetes mellitus     pt denies DM noe meds    Past Surgical History  Procedure Laterality Date  . Breast surgery  1989    RIGHT LUMPECTOMY, RADIATION AND CHEMO  . Hysteroscopy  2011    Polyp  . Pelvic laparoscopy/ hysteroscopy  1996  . Foot surgery  2013    BILATERAL     Current Outpatient Prescriptions  Medication Sig Dispense Refill  . ALPRAZolam (XANAX) 1 MG tablet Take 1 mg by mouth at bedtime as needed for anxiety.    . betamethasone dipropionate (DIPROLENE) 0.05 % cream Apply topically 2 (two) times daily. (Patient taking differently: Apply topically as needed. ) 30 g 0  . calcium carbonate (TUMS - DOSED IN MG ELEMENTAL CALCIUM) 500 MG chewable tablet Chew 1 tablet by mouth as needed for indigestion or heartburn.    . divalproex (DEPAKOTE) 250 MG DR tablet Take 250 mg by mouth 3 (three) times daily. Pt takes Depakote DR 250 mg, 2 tablets in the morning and 3 tablets in the evening    . gabapentin (NEURONTIN) 100 MG capsule TAKE ONE CAPSULE BY MOUTH THREE TIMES A DAY 90 capsule 1  . lamoTRIgine (LAMICTAL) 200 MG tablet Take 200 mg by mouth at bedtime.  1  . levothyroxine (SYNTHROID, LEVOTHROID) 100 MCG tablet Take 100 mcg by mouth daily before breakfast.     . lidocaine-prilocaine (EMLA) cream Apply topically as needed. Apply  to port with every chemotherapy. 30 g 1  . loperamide (IMODIUM A-D) 2 MG tablet Take 2 mg by mouth as needed for diarrhea or loose stools.    Marland Kitchen omeprazole (PRILOSEC) 10 MG capsule Take 10 mg by mouth daily.    . ondansetron (ZOFRAN ODT) 8 MG disintegrating tablet Take 1 tablet (8 mg total) by mouth every 8 (eight) hours as needed for nausea or vomiting. 20 tablet 0  . rOPINIRole (REQUIP) 1 MG tablet Take 1 mg by mouth at bedtime.     No current facility-administered medications for this visit.    Allergies as of 03/03/2015 - Review Complete 03/03/2015  Allergen Reaction Noted  . Pollen extract Other (See Comments) 11/04/2014  Vitals: BP 132/84 mmHg  Pulse 77  Ht 5\' 2"  (1.575 m)  Wt 161 lb 6.4 oz (73.211 kg)  BMI 29.51 kg/m2 Last Weight:  Wt Readings from Last 1 Encounters:  03/04/15 158 lb 9.6 oz (71.94 kg)   Last Height:   Ht Readings from Last 1 Encounters:  03/04/15 5\' 2"  (1.575 m)   Physical exam: Exam: Gen: NAD, conversant                    CV: RRR, no MRG. No Carotid Bruits. No peripheral edema, warm, nontender Eyes: Conjunctivae clear without exudates or hemorrhage  Neuro: Detailed Neurologic Exam  Speech:    Speech is normal; fluent and spontaneous with normal comprehension.  Cognition:    The patient is oriented to person, place, and time;     recent and remote memory intact;     language fluent;     normal attention, concentration,     fund of knowledge Cranial Nerves:    The pupils are equal, round, and reactive to light. The fundi are flat. Visual fields are full to finger confrontation. Extraocular movements are intact. Trigeminal sensation is intact and the muscles of mastication are normal. The face is symmetric. The palate elevates in the midline. Hearing intact. Voice is normal. Shoulder shrug is normal. The tongue has normal motion without fasciculations.   Coordination:    Normal finger to nose and heel to shin. Normal rapid alternating  movements.   Gait:    Not ataxic  Motor Observation:    No asymmetry, no atrophy, and no involuntary movements noted. Tone:    Normal muscle tone.    Posture:    Posture is normal. normal erect    Strength:    Strength is V/V in the upper and lower limbs.      Sensation: Decr pinrick and temp distally in the feet. Vibration and pinprick intact.      Reflex Exam:  DTR's:    Deep tendon reflexes in the upper and lower extremities are brisks  bilaterally.   Toes:    The toes are downgoing bilaterally.   Clonus:    Clonus is absent.   Marye Round Negative    Assessment/Plan:  59 year old female here with persistent dizziness and lightheadedness since chemotherapy. 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 This was diagnosed in July of 2014.She is also status post lumpectomy for breast cancer diagnosed 25 years ago followed by radiation and chemotherapy carboplatin and Taxotere started on 10/01/2012. Avastin was added with cycle 4. This was discontinued in June of 2015.    Dix-hallpike was negative and she denies room spinning or vertigionous symptoms. Not c/w BPPV. Not orthostatic in the office today. May be medication effects, she is on multiple medications that can cause dizziness including multiple AEDs , will order AED levels. Will order MRI of the brain and cervical cord w/wo contrast to ensure no intracranial or cord etiology. Needs physical therapy for imbalance, fall risk. Advised to use walking aid.  If workup negative recommend follow up with oncology and primary care.  She has mild small-fiber distal neuropathy on exam.  CC: Dr. Cherrie Distance, [facility] [address] [place], [place] [address]  Phone [phone] Fax [phone]

## 2015-03-03 NOTE — Patient Instructions (Signed)
Remember to drink plenty of fluid, eat healthy meals and do not skip any meals. Try to eat protein with a every meal and eat a healthy snack such as fruit or nuts in between meals. Try to keep a regular sleep-wake schedule and try to exercise daily, particularly in the form of walking, 20-30 minutes a day, if you can.   As far as diagnostic testing: MRi of the brain and of the cervical spine  I would like to see you back after imaging and physical therapy, sooner if we need to. Please call us with any interim questions, concerns, problems, updates or refill requests.   Our phone number is [phone]. We also have an after hours call service for urgent matters and there is a physician on-call for urgent questions. For any emergencies you know to call 911 or go to the nearest emergency room

## 2015-03-04 ENCOUNTER — Ambulatory Visit (HOSPITAL_BASED_OUTPATIENT_CLINIC_OR_DEPARTMENT_OTHER): Payer: PPO | Admitting: Oncology

## 2015-03-04 ENCOUNTER — Ambulatory Visit: Payer: PPO

## 2015-03-04 ENCOUNTER — Telehealth: Payer: Self-pay | Admitting: Neurology

## 2015-03-04 ENCOUNTER — Ambulatory Visit (HOSPITAL_BASED_OUTPATIENT_CLINIC_OR_DEPARTMENT_OTHER): Payer: PPO

## 2015-03-04 ENCOUNTER — Other Ambulatory Visit (HOSPITAL_BASED_OUTPATIENT_CLINIC_OR_DEPARTMENT_OTHER): Payer: PPO

## 2015-03-04 ENCOUNTER — Telehealth: Payer: Self-pay | Admitting: *Deleted

## 2015-03-04 ENCOUNTER — Telehealth: Payer: Self-pay | Admitting: Oncology

## 2015-03-04 VITALS — BP 145/88 | HR 60 | Resp 18

## 2015-03-04 VITALS — BP 136/79 | HR 60 | Temp 97.8°F | Resp 18 | Ht 62.0 in | Wt 158.6 lb

## 2015-03-04 DIAGNOSIS — C569 Malignant neoplasm of unspecified ovary: Secondary | ICD-10-CM

## 2015-03-04 DIAGNOSIS — F419 Anxiety disorder, unspecified: Secondary | ICD-10-CM

## 2015-03-04 DIAGNOSIS — R635 Abnormal weight gain: Secondary | ICD-10-CM | POA: Diagnosis not present

## 2015-03-04 DIAGNOSIS — Z5112 Encounter for antineoplastic immunotherapy: Secondary | ICD-10-CM

## 2015-03-04 DIAGNOSIS — D696 Thrombocytopenia, unspecified: Secondary | ICD-10-CM

## 2015-03-04 DIAGNOSIS — F329 Major depressive disorder, single episode, unspecified: Secondary | ICD-10-CM

## 2015-03-04 DIAGNOSIS — Z95828 Presence of other vascular implants and grafts: Secondary | ICD-10-CM

## 2015-03-04 LAB — CBC WITH DIFFERENTIAL/PLATELET
BASO%: 0.5 % (ref 0.0–2.0)
Basophils Absolute: 0 10*3/uL (ref 0.0–0.1)
EOS%: 3 % (ref 0.0–7.0)
Eosinophils Absolute: 0.1 10*3/uL (ref 0.0–0.5)
HEMATOCRIT: 38.2 % (ref 34.8–46.6)
HGB: 12 g/dL (ref 11.6–15.9)
LYMPH#: 1.6 10*3/uL (ref 0.9–3.3)
LYMPH%: 36.2 % (ref 14.0–49.7)
MCH: 28.7 pg (ref 25.1–34.0)
MCHC: 31.4 g/dL — AB (ref 31.5–36.0)
MCV: 91.4 fL (ref 79.5–101.0)
MONO#: 0.4 10*3/uL (ref 0.1–0.9)
MONO%: 9.3 % (ref 0.0–14.0)
NEUT%: 51 % (ref 38.4–76.8)
NEUTROS ABS: 2.3 10*3/uL (ref 1.5–6.5)
PLATELETS: 92 10*3/uL — AB (ref 145–400)
RBC: 4.18 10*6/uL (ref 3.70–5.45)
RDW: 15 % — ABNORMAL HIGH (ref 11.2–14.5)
WBC: 4.5 10*3/uL (ref 3.9–10.3)

## 2015-03-04 LAB — COMPREHENSIVE METABOLIC PANEL
ALBUMIN: 3.5 g/dL (ref 3.5–5.0)
ALK PHOS: 58 U/L (ref 40–150)
ALT: 11 U/L (ref 0–55)
ANION GAP: 8 meq/L (ref 3–11)
AST: 19 U/L (ref 5–34)
BILIRUBIN TOTAL: 0.34 mg/dL (ref 0.20–1.20)
BUN: 14.4 mg/dL (ref 7.0–26.0)
CALCIUM: 9.2 mg/dL (ref 8.4–10.4)
CO2: 26 mEq/L (ref 22–29)
CREATININE: 0.8 mg/dL (ref 0.6–1.1)
Chloride: 105 mEq/L (ref 98–109)
EGFR: 78 mL/min/{1.73_m2} — ABNORMAL LOW (ref >90)
Glucose: 81 mg/dl (ref 70–140)
Potassium: 4.9 mEq/L (ref 3.5–5.1)
Sodium: 139 mEq/L (ref 136–145)
TOTAL PROTEIN: 7.8 g/dL (ref 6.4–8.3)

## 2015-03-04 LAB — AMMONIA: AMMONIA: 63 ug/dL (ref 19–87)

## 2015-03-04 LAB — VALPROIC ACID LEVEL: VALPROIC ACID LVL: 65 ug/mL (ref 50–100)

## 2015-03-04 LAB — LAMOTRIGINE LEVEL: Lamotrigine Lvl: 11.1 ug/mL (ref 2.0–20.0)

## 2015-03-04 MED ORDER — SODIUM CHLORIDE 0.9 % IJ SOLN
10.0000 mL | INTRAMUSCULAR | Status: DC | PRN
  Administered 2015-03-04: 10 mL via INTRAVENOUS
  Filled 2015-03-04: qty 10

## 2015-03-04 MED ORDER — SODIUM CHLORIDE 0.9 % IV SOLN
Freq: Once | INTRAVENOUS | Status: AC
  Administered 2015-03-04: 14:00:00 via INTRAVENOUS

## 2015-03-04 MED ORDER — SODIUM CHLORIDE 0.9 % IV SOLN
14.5000 mg/kg | Freq: Once | INTRAVENOUS | Status: AC
  Administered 2015-03-04: 1100 mg via INTRAVENOUS
  Filled 2015-03-04: qty 44

## 2015-03-04 MED ORDER — HEPARIN SOD (PORK) LOCK FLUSH 100 UNIT/ML IV SOLN
500.0000 [IU] | Freq: Once | INTRAVENOUS | Status: AC | PRN
  Administered 2015-03-04: 500 [IU]
  Filled 2015-03-04: qty 5

## 2015-03-04 MED ORDER — SODIUM CHLORIDE 0.9 % IJ SOLN
10.0000 mL | INTRAMUSCULAR | Status: DC | PRN
  Administered 2015-03-04: 10 mL
  Filled 2015-03-04: qty 10

## 2015-03-04 NOTE — Telephone Encounter (Signed)
Pt confirmed appt received avs ° °

## 2015-03-04 NOTE — Telephone Encounter (Signed)
Called pt back. Advised she can take 1-2 tablets 30 min prior to test and additional tablet at time of test if needed .Must have driver. She verbalized understanding.

## 2015-03-04 NOTE — Patient Instructions (Signed)
[facility] Discharge Instructions for Patients Receiving Chemotherapy  Today you received the following chemotherapy agents: Avastin  To help prevent nausea and vomiting after your treatment, we encourage you to take your nausea medication. If you develop nausea and vomiting that is not controlled by your nausea medication, call the clinic.   BELOW ARE SYMPTOMS THAT SHOULD BE REPORTED IMMEDIATELY:  *FEVER GREATER THAN 100.5 F  *CHILLS WITH OR WITHOUT FEVER  NAUSEA AND VOMITING THAT IS NOT CONTROLLED WITH YOUR NAUSEA MEDICATION  *UNUSUAL SHORTNESS OF BREATH  *UNUSUAL BRUISING OR BLEEDING  TENDERNESS IN MOUTH AND THROAT WITH OR WITHOUT PRESENCE OF ULCERS  *URINARY PROBLEMS  *BOWEL PROBLEMS  UNUSUAL RASH Items with * indicate a potential emergency and should be followed up as soon as possible.  Feel free to call the clinic you have any questions or concerns. The clinic phone number is [phone].  Please show the CHEMO ALERT CARD at check-in to the Emergency Department and triage nurse.   

## 2015-03-04 NOTE — Telephone Encounter (Signed)
Per staff message and POF I have scheduled appts. Advised scheduler of appts. JMW  

## 2015-03-04 NOTE — Progress Notes (Signed)
Hematology and Oncology Follow Up Visit  [name] [id number] Jan 16, 1956 60 y.o. 03/04/2015 1:12 PM    Principle Diagnosis: 60 year old woman diagnosed with peritoneal carcinomatosis and ascites that is biopsy proven to be adenocarcinoma  of a GYN etiology. This was diagnosed in July of 2014.   Prior Therapy:  She is status post paracentesis performed on 09/17/2012 with the cytology confirmed the presence of adenocarcinoma and immunohistochemical stains suggest GYN etiology. She is also status post lumpectomy for breast cancer diagnosed 25 years ago followed by radiation and chemotherapy under the care of Dr. Sonny Dandy. She did not receive any hormonal therapy. Chemotherapy utilizing carboplatin and Taxotere started on 10/01/2012. Avastin was added with cycle 4. This was discontinued in June of 2015.  Current therapy:  She is currently on a Avastin maintenance only. Chemotherapy was discontinued in June of 2015.  Interim History: [name] presents today for a followup visit with her husband. Since her last visit, she reports no major changes in her health. She has not reported any syncopal episodes or falls. She continues to have difficulties with ambulating and balance. She was seen by neurology and scheduled to have an MRI in the near future.  She continues to tolerate Avastin without any complications. She has not reported any bleeding issues. She has not reported any epistaxis, hematochezia or melena. She has not reported any infusion-related complications. She has not reported increase in her blood pressure or thrombosis. She has not reported any abdominal distention or early satiety. Has not reported any weight loss or change in her appetite.she does not report any arthralgias or myalgias. Her quality of life and performance status remains excellent.  She does not report any headaches, blurry vision, syncope or seizures. She denied any fever chills. Has not reported any shortness of  breath or cough. She denied any pain with swallowing. She has reported also grade 1 neuropathy which remains stable.  She has not any shortness of breath or chest pain. She denied any GYN bleeding. She has not reported any increase in her abdominal pain or early satiety. She also reports no hematochezia or melena.  Her mood is excellent without any depression or anxiety. Remainder of the review of systems is unremarkable.  Medications: I have reviewed the patient's current medications.  Current Outpatient Prescriptions  Medication Sig Dispense Refill  . ALPRAZolam (XANAX) 1 MG tablet Take 1 mg by mouth at bedtime as needed for anxiety.    . betamethasone dipropionate (DIPROLENE) 0.05 % cream Apply topically 2 (two) times daily. (Patient taking differently: Apply topically as needed. ) 30 g 0  . calcium carbonate (TUMS - DOSED IN MG ELEMENTAL CALCIUM) 500 MG chewable tablet Chew 1 tablet by mouth as needed for indigestion or heartburn.    . divalproex (DEPAKOTE) 250 MG DR tablet Take 250 mg by mouth 3 (three) times daily. Pt takes Depakote DR 250 mg, 2 tablets in the morning and 3 tablets in the evening    . gabapentin (NEURONTIN) 100 MG capsule TAKE ONE CAPSULE BY MOUTH THREE TIMES A DAY 90 capsule 1  . lamoTRIgine (LAMICTAL) 200 MG tablet Take 200 mg by mouth at bedtime.  1  . levothyroxine (SYNTHROID, LEVOTHROID) 100 MCG tablet Take 100 mcg by mouth daily before breakfast.     . lidocaine-prilocaine (EMLA) cream Apply topically as needed. Apply to port with every chemotherapy. 30 g 1  . loperamide (IMODIUM A-D) 2 MG tablet Take 2 mg by mouth as needed for diarrhea or  loose stools.    Marland Kitchen omeprazole (PRILOSEC) 10 MG capsule Take 10 mg by mouth daily.    . ondansetron (ZOFRAN ODT) 8 MG disintegrating tablet Take 1 tablet (8 mg total) by mouth every 8 (eight) hours as needed for nausea or vomiting. 20 tablet 0  . rOPINIRole (REQUIP) 1 MG tablet Take 1 mg by mouth at bedtime.     No current  facility-administered medications for this visit.     Allergies:  Allergies  Allergen Reactions  . Pollen Extract Other (See Comments)    Pollen and grass causes a lot sneezing    Past Medical History, Surgical history, Social history, and Family History were reviewed and updated.   Physical Exam: Blood pressure 136/79, pulse 60, temperature 97.8 F (36.6 C), temperature source Oral, resp. rate 18, height 5\' 2"  (1.575 m), weight 158 lb 9.6 oz (71.94 kg), SpO2 100 %.  ECOG: 1 General appearance: well appearing woman without distress. Head: Normocephalic, without obvious abnormality. No oral thrush. Neck: no adenopathy Lymph nodes: Cervical, supraclavicular, and axillary nodes normal. Heart:regular rate and rhythm, S1, S2 normal, no murmur, click, rub or gallop Lung:chest clear, no wheezing, rales, normal symmetric air entry Abdomin: Soft, nontender with good bowel sounds. No shifting dullness or ascites. EXT:no erythema, induration, or nodules. . Skin: No rashes or lesions.  Lab Results: Lab Results  Component Value Date   WBC 4.5 03/04/2015   HGB 12.0 03/04/2015   HCT 38.2 03/04/2015   MCV 91.4 03/04/2015   PLT 92* 03/04/2015     Chemistry      Component Value Date/Time   NA 143 02/11/2015 1005   NA 140 01/21/2015 1015   K 4.2 02/11/2015 1005   K 4.7 01/21/2015 1015   CL 104 02/11/2015 1005   CO2 30 02/11/2015 1005   CO2 27 01/21/2015 1015   BUN 16 02/11/2015 1005   BUN 20.9 01/21/2015 1015   CREATININE 0.69 02/11/2015 1005   CREATININE 0.8 01/21/2015 1015      Component Value Date/Time   CALCIUM 10.0 02/11/2015 1005   CALCIUM 9.3 01/21/2015 1015   ALKPHOS 69 02/11/2015 1005   ALKPHOS 71 01/21/2015 1015   AST 31 02/11/2015 1005   AST 22 01/21/2015 1015   ALT 18 02/11/2015 1005   ALT 11 01/21/2015 1015   BILITOT 0.5 02/11/2015 1005   BILITOT 0.43 01/21/2015 1015         Impression and Plan:  60 year old woman with the following issues:  1.  Peritoneal carcinomatosis with omental involvement. The pathology confirmed the presence of adenocarcinoma of likely GYN etiology. Her CA 125 was elevated at 333.5 on 09/19/2012. She is status post systemic chemotherapy with an excellent response utilizing carboplatin and Taxotere and a Avastin . This therapy was held in June of 2015 and currently on Avastin maintenance.   CT scan obtained on 12/29/2014 continues to show stable disease. The plan is to continue with maintenance a Avastin for now. We will repeat imaging studies in March 2017 and used for salvage therapy upon progression.  2. IV access: She had a Port-A-Cath is in place without any complications including pain, bleeding or infection.  3. Thrombocytopenia: Her platelets count today is to improve without any bleeding complications. No intervention is needed at this time.  4. Weight gain: we have been relatively stable with reasonably good appetite.  5. Anxiety and depression: Her mood continues to be relatively stable and follows up with psychiatry regarding this issue.  6. Presyncopal  episode with possible positional vertigo: Unclear etiology could be BPV or orthostasis. She has been evaluated by neurology with an MRI pending. Metastatic disease to the CNS as important to rule out thinks this likely.  7. The followup: She will followup in 3 weeks for her next cycle of a Avastin.    Zola Button, MD 1/13/20171:12 PM

## 2015-03-04 NOTE — Telephone Encounter (Signed)
Pt called said she has MRI on 1/18 and is inquiring if she can take g Xanax 0.5 that she already has. She wants to take a little more than she normally takes. She only takes a 1/2 at night prn. She was not able to complete last MRI due to being claustrophobic.

## 2015-03-05 LAB — CA 125: CANCER ANTIGEN (CA) 125: 78.3 U/mL — AB (ref 0.0–38.1)

## 2015-03-05 LAB — CANCER ANTIGEN 125 (PARALLEL TESTING): CA 125: 70 U/mL — ABNORMAL HIGH (ref <35)

## 2015-03-07 ENCOUNTER — Telehealth: Payer: Self-pay

## 2015-03-07 NOTE — Telephone Encounter (Signed)
Spoke to pt and advised her that her labs were normal.  Pt is asking if we could fax the results to her psychiatrist at [facility]. Pt couldn't remember the name of her psychiatrist. I advised her that since that psychiatrist was not the referring MD, she would need to come in and sign a release form. Pt verbalized understanding and will do this sometime this week.

## 2015-03-07 NOTE — Telephone Encounter (Signed)
-----   Message from Melvenia Beam, MD sent at 03/05/2015  5:42 PM EST ----- Labs normal thanks

## 2015-03-09 ENCOUNTER — Telehealth: Payer: Self-pay | Admitting: Neurology

## 2015-03-09 NOTE — Telephone Encounter (Signed)
[facility] [phone] called to advise Brain/Cervical with and without contrast has been rescheduled to tomorrow 03/10/15 12:45pm arrival time 12:15pm due to Authorization still not received.

## 2015-03-09 NOTE — Telephone Encounter (Signed)
Authorization pending,  I will forward to [place].  Thanks!

## 2015-03-09 NOTE — Telephone Encounter (Signed)
I have sent message to insurance to speed the process. Thanks!

## 2015-03-10 ENCOUNTER — Ambulatory Visit (INDEPENDENT_AMBULATORY_CARE_PROVIDER_SITE_OTHER): Payer: PPO

## 2015-03-10 ENCOUNTER — Ambulatory Visit (INDEPENDENT_AMBULATORY_CARE_PROVIDER_SITE_OTHER): Payer: Self-pay

## 2015-03-10 DIAGNOSIS — W19XXXA Unspecified fall, initial encounter: Secondary | ICD-10-CM

## 2015-03-10 DIAGNOSIS — M542 Cervicalgia: Secondary | ICD-10-CM

## 2015-03-10 DIAGNOSIS — Z0289 Encounter for other administrative examinations: Secondary | ICD-10-CM

## 2015-03-10 DIAGNOSIS — T451X5A Adverse effect of antineoplastic and immunosuppressive drugs, initial encounter: Secondary | ICD-10-CM

## 2015-03-10 DIAGNOSIS — R270 Ataxia, unspecified: Secondary | ICD-10-CM

## 2015-03-10 DIAGNOSIS — R42 Dizziness and giddiness: Secondary | ICD-10-CM

## 2015-03-14 ENCOUNTER — Encounter: Payer: Self-pay | Admitting: Oncology

## 2015-03-16 ENCOUNTER — Telehealth: Payer: Self-pay | Admitting: Oncology

## 2015-03-16 ENCOUNTER — Telehealth: Payer: Self-pay | Admitting: *Deleted

## 2015-03-16 NOTE — Telephone Encounter (Signed)
Pt called asked to move 2/3 appt to somethinglater. Gave pt appt time of 11am.

## 2015-03-16 NOTE — Telephone Encounter (Signed)
Called pt and relayed message below per Dr Jaynee Eagles request. Made f/u to review images on 03/28/15 at 230 for check in 215pm.

## 2015-03-16 NOTE — Telephone Encounter (Signed)
-----   Message from Melvenia Beam, MD sent at 03/16/2015 10:28 AM EST ----- Terrence Dupont, there was nothing serious on her brain and cervical spine results but it is difficult to discuss over the phone and go through the results. Set up a follo wup appointment to review images please thanks

## 2015-03-21 ENCOUNTER — Ambulatory Visit: Payer: PPO | Attending: Neurology | Admitting: Physical Therapy

## 2015-03-21 DIAGNOSIS — R2681 Unsteadiness on feet: Secondary | ICD-10-CM | POA: Insufficient documentation

## 2015-03-21 DIAGNOSIS — R42 Dizziness and giddiness: Secondary | ICD-10-CM | POA: Diagnosis not present

## 2015-03-21 DIAGNOSIS — M436 Torticollis: Secondary | ICD-10-CM | POA: Diagnosis not present

## 2015-03-21 DIAGNOSIS — R269 Unspecified abnormalities of gait and mobility: Secondary | ICD-10-CM | POA: Insufficient documentation

## 2015-03-22 NOTE — Therapy (Addendum)
[facility] [address] [place], [place], [address] Phone: [phone]   Fax:  [phone]  Physical Therapy Evaluation  Patient Details  Name: [name] MRN: [id number] Date of Birth: April 20, 1955 Referring Provider: Sarina Ill, MD  Encounter Date: 03/21/2015    Past Medical History  Diagnosis Date  . Premature menopause   . Osteopenia 03/2009    t score -2.1 FRAX 4.6/0.4  . Hypothyroidism   . Depression   . Anxiety   . Hyperlipidemia   . Bipolar disorder ([place])   . ADD (attention deficit disorder)   . Hematochezia   . Allergy     seasonal  . Maintenance chemotherapy     Pt has chemo every 3 weeks (on Friday)  . Sleep apnea     mild  . Breast cancer ([place]) 1989    right breast  . Diabetes mellitus     pt denies DM noe meds    Past Surgical History  Procedure Laterality Date  . Breast surgery  1989    RIGHT LUMPECTOMY, RADIATION AND CHEMO  . Hysteroscopy  2011    Polyp  . Pelvic laparoscopy/ hysteroscopy  1996  . Foot surgery  2013    BILATERAL     There were no vitals filed for this visit.  Visit Diagnosis:  Stiffness of neck - Plan: PT plan of care cert/re-cert  Dizziness and giddiness - Plan: PT plan of care cert/re-cert  Unsteadiness - Plan: PT plan of care cert/re-cert  Abnormality of gait - Plan: PT plan of care cert/re-cert      Subjective Assessment - 03/21/15 1410    Subjective Pt reports dizziness and impaired balance. Pt reports having fallen in hospital bathroom (01/21/15) and hit wall. Pt denies LOC. Dizziness started "off and on" 6 months before fall.  haven't started using cane; but I might need to."   Pertinent History PMH significant: omentum cancer (active; chemo every 3 weeks); h/o breast cancer s/p R lumpectomy (1989); bipolar disorder, anxiety, depression, OCD, ADD   Patient Stated Goals "To not be dizzy and to be balanced."   Currently in Pain? No/denies             [facility] PT Assessment - 04/11/15 0001    Assessment   Medical Diagnosis dizziness; falls; vertigo   Onset Date/Surgical Date 07/21/14   Hand Dominance Right   Precautions   Precautions Fall;Other (comment)   Precaution Comments * BP on L arm only   Restrictions   Weight Bearing Restrictions No   Home Environment   Living Environment Private residence   Living Arrangements Spouse/significant other   Type of [place] to enter   Entrance Stairs-Number of Steps 2   Entrance Stairs-Rails None   Home Layout One level   Home Equipment None   Prior Function   Level of Independence Independent   Vocation On disability   Lexicographer (sedentary job)   Leisure watches a lot of TV   Sensation   Light Touch Impaired by gross assessment   Additional Comments Numbness in toes of B feet  Pt attributes to chemotherapy   Coordination   Gross Motor Movements are Fluid and Coordinated Yes   AROM   Overall AROM  Deficits   Cervical Flexion 29  painful   Cervical Extension 28   Cervical - Right Side Bend 9   Cervical - Left Side Bend 6   Cervical - Right  Rotation 26  "stiffness"   Cervical - Left Rotation 19  "stiffness"   Transfers   Transfers Sit to Stand;Stand to Sit   Sit to Stand 7: Independent   Stand to Sit 7: Independent   Ambulation/Gait   Ambulation/Gait Yes   Ambulation/Gait Assistance 6: Modified independent (Device/Increase time);5: Supervision   Ambulation Distance (Feet) 275 Feet   Assistive device None   Gait Pattern Step-through pattern;Decreased stride length;Decreased trunk rotation  turns en bloc   Gait velocity 2.42 ft/sec   Stairs Yes   Stairs Assistance 6: Modified independent (Device/Increase time)   Stair Management Technique Two rails;Alternating pattern   Number of Stairs 4   Height of Stairs 6   Standardized Balance Assessment   Standardized Balance Assessment Dynamic Gait Index   Dynamic Gait  Index   Level Surface Mild Impairment   Change in Gait Speed Mild Impairment   Gait with Horizontal Head Turns Mild Impairment   Gait with Vertical Head Turns Moderate Impairment   Gait and Pivot Turn Mild Impairment   Step Over Obstacle Moderate Impairment   Step Around Obstacles Moderate Impairment   Steps Mild Impairment   Total Score 13                           PT Education - 03/21/15 2120    Education provided Yes   Education Details PT eval findings, goals, and POC.   Person(s) Educated Patient   Methods Explanation   Comprehension Verbalized understanding          PT Short Term Goals - 03/28/15 1555    PT SHORT TERM GOAL #1   Title Pt will perform initial HEP with mod I using paper handout to maximize functional gains made in PT.  (Target date: 04/18/15)   PT SHORT TERM GOAL #2   Title Rule out orthostatic hypotension as contributing factor to dizziness.  (Target date: 04/18/15)   Baseline Orthostatic hypotension ruled out 2/6.   Status Achieved   PT SHORT TERM GOAL #3   Title Pt will improve active cervical spine lateral flexion to 15 degrees bilaterally to progress toward normal neck ROM.  (Target date: 04/18/15)   Baseline 9 degrees to R;   6 degrees to L   PT SHORT TERM GOAL #4   Title Pt will improve active cervical spine rotation to 40 degrees bilaterally progress toward normal neck ROM.   (Target date: 04/18/15)   Baseline 26 degrees to R;   19 degrees to L   PT SHORT TERM GOAL #5   Title Pt will improve DGI score from 13/24 to 16/24 to indicate improved dynamic gait stability.  (Target date: 04/18/15)   PT SHORT TERM GOAL #6   Title Pt will improve gait speed from 2.42 ft/sec to > 2.62 ft/sec to indicate functional status of community ambulator.  (Target date: 04/18/15)           PT Long Term Goals - 03/21/15 2019    PT LONG TERM GOAL #1   Title Pt will improve active cervical spine lateral flexion to 30 degrees bilaterally to progress  toward normal neck ROM. (Target date: 05/16/15)   Baseline 9 degrees to R;   6 degrees to L   PT LONG TERM GOAL #2   Title Pt will improve active cervical spine rotation to 55 degrees bilaterally progress toward normal neck ROM.   (Target date: 05/16/15)   Baseline 26 degrees to R;  19 degrees to L   PT LONG TERM GOAL #3   Title Pt will improve DGI score from 13/24 to > / = 20/24 to indicate decreased fall risk.    (Target date: 05/16/15)   PT LONG TERM GOAL #4   Title Pt will decrease DHI score from 72 to < / = 54 to indicate significant decrease in pt-perceived disability due to dizziness.   (Target date: 05/16/15)   PT LONG TERM GOAL #5   Title Pt will independently negotiate 2 stairs without rails to indicate pt abiliy to safely use primary home entrance.    (Target date: 05/16/15)   Additional Long Term Goals   Additional Long Term Goals Yes   PT LONG TERM GOAL #6   Title Pt will independently ambulate > 500' over unlevel, paved surfaces and negotiate standard ramp and curb step to indicate safety with community mobility.    (Target date: 05/16/15)               Plan - 03/22/15 [id number]    Clinical Impression Statement Pt is a 60 y/o F referred to outpatient PT to address functional impairments associated with dizziness and recent fall.  PMH significant: omentum cancer (active; chemo every 3 weeks); h/o breast cancer s/p R lumpectomy (1989); bipolar disorder, anxiety, depression, OCD, ADD. PT evaluation reveals the following impairments: postural impairments; significant limitation in cervical spine AROM in all planes (most severely limited in lateral flexion and rotation); disequilibrium and imbalance with functional head movement; gait impairments; decreased stability/independence with dynamic gait; motion sensitivity with supine to/from sit; high fall risk, per DGI score of 13/24; gait velocity suggestive of functional status of limited community ambulator; and DHI score suggestive of severe  pt-perceived disability due to dizziness/imbalance. Pt would benefit from skilled outpatient PT 2x/week for 8 weeks to address said functional limitations. Due to high insurance copay, pt requesting PT 2x/week for initial 4 weeks, followed by 1x/week for subsequent 4 weeks.   Pt will benefit from skilled therapeutic intervention in order to improve on the following deficits Pain;Postural dysfunction;Decreased balance;Abnormal gait;Decreased knowledge of use of DME;Decreased range of motion;Decreased mobility;Hypomobility;Impaired sensation;Impaired flexibility;Decreased activity tolerance;Increased fascial restricitons;Decreased strength   Rehab Potential Good   Clinical Impairments Affecting Rehab Potential high insurance copay; currently undergoing chemotherapy   PT Frequency Other (comment)  2x/week for 4 weeks then 1x/week for 4 weeks   PT Duration Other (comment)  See above.   PT Treatment/Interventions ADLs/Self Care Home Management;Vestibular;Manual techniques;Patient/family education;Neuromuscular re-education;Gait training;Stair training;Functional mobility training;Therapeutic activities;Therapeutic exercise;Balance training;DME Instruction   PT Next Visit Plan Rule out orthostatic hypotension. Initiate HEP with focus on increasing cervical spine ROM.  If not orthostatic, add Nestor Lewandowsky habituation to HEP.  Manual therapy; education on posture.   Consulted and Agree with Plan of Care Patient          G-Codes - 2015-04-18 1600    Functional Assessment Tool Used DGI = 13/24   Functional Limitation Mobility: Walking and moving around   Mobility: Walking and Moving Around Current Status [phone]) At least 40 percent but less than 60 percent impaired, limited or restricted   Mobility: Walking and Moving Around Goal Status [phone]) At least 1 percent but less than 20 percent impaired, limited or restricted       Problem List Patient Active Problem List   Diagnosis Date Noted  .  Dizziness 03/29/2015  . Ovarian cancer ([place]) 08/13/2013  . Malignant neoplasm of female breast ([place]) 09/19/2012  . Lithium  use 09/17/2011  . Abnormal coordination 09/17/2011  . Premature menopause   . Depression   . Anxiety   . Bipolar disorder ([place])   . ADD (attention deficit disorder)   . Diabetes mellitus   . NONSPECIFIC ABNORMAL ELECTROCARDIOGRAM 11/04/2009  . ALLERGIC RHINITIS 09/01/2009  . INCONTINENCE 09/01/2009  . DIAB W/O COMP TYPE II/UNS NOT STATED UNCNTRL 07/28/2008  . NONSPEC ELEVATION OF LEVELS OF TRANSAMINASE/LDH 07/28/2008  . MOTOR RESTLESSNESS 02/19/2008  . Internal hemorrhoids without mention of complication [id number]  . ESOPHAGEAL STRICTURE 09/15/2007  . DYSPHAGIA 09/15/2007  . OBSTRUCTIVE SLEEP APNEA 07/21/2007  . HYPERLIPIDEMIA 07/07/2007  . Osteopenia 02/20/2007  . HYPOTHYROIDISM 10/01/2006  . BREAST CANCER, HX OF 08/12/2006    Billie Ruddy, PT, DPT [facility] [address] [name], [place], [address] Phone: [phone]   Fax:  [phone] 04/11/2015, 12:31 PM  Name: [name] MRN: [id number] Date of Birth: 1955-08-19

## 2015-03-25 ENCOUNTER — Ambulatory Visit: Payer: PPO

## 2015-03-25 ENCOUNTER — Ambulatory Visit (HOSPITAL_BASED_OUTPATIENT_CLINIC_OR_DEPARTMENT_OTHER): Payer: PPO

## 2015-03-25 ENCOUNTER — Other Ambulatory Visit (HOSPITAL_BASED_OUTPATIENT_CLINIC_OR_DEPARTMENT_OTHER): Payer: PPO

## 2015-03-25 VITALS — BP 130/73 | HR 67 | Temp 98.1°F | Resp 18

## 2015-03-25 DIAGNOSIS — C50919 Malignant neoplasm of unspecified site of unspecified female breast: Secondary | ICD-10-CM

## 2015-03-25 DIAGNOSIS — Z5112 Encounter for antineoplastic immunotherapy: Secondary | ICD-10-CM

## 2015-03-25 DIAGNOSIS — C569 Malignant neoplasm of unspecified ovary: Secondary | ICD-10-CM

## 2015-03-25 DIAGNOSIS — Z95828 Presence of other vascular implants and grafts: Secondary | ICD-10-CM

## 2015-03-25 LAB — COMPREHENSIVE METABOLIC PANEL
ALT: 13 U/L (ref 0–55)
AST: 19 U/L (ref 5–34)
Albumin: 3.3 g/dL — ABNORMAL LOW (ref 3.5–5.0)
Alkaline Phosphatase: 66 U/L (ref 40–150)
Anion Gap: 9 mEq/L (ref 3–11)
BUN: 15.2 mg/dL (ref 7.0–26.0)
CALCIUM: 10 mg/dL (ref 8.4–10.4)
CHLORIDE: 104 meq/L (ref 98–109)
CO2: 27 mEq/L (ref 22–29)
CREATININE: 0.8 mg/dL (ref 0.6–1.1)
EGFR: 80 mL/min/{1.73_m2} — ABNORMAL LOW (ref >90)
GLUCOSE: 87 mg/dL (ref 70–140)
Potassium: 5.1 mEq/L (ref 3.5–5.1)
SODIUM: 141 meq/L (ref 136–145)
Total Bilirubin: 0.39 mg/dL (ref 0.20–1.20)
Total Protein: 7.5 g/dL (ref 6.4–8.3)

## 2015-03-25 LAB — CBC WITH DIFFERENTIAL/PLATELET
BASO%: 0.2 % (ref 0.0–2.0)
Basophils Absolute: 0 10*3/uL (ref 0.0–0.1)
EOS%: 3.2 % (ref 0.0–7.0)
Eosinophils Absolute: 0.1 10*3/uL (ref 0.0–0.5)
HEMATOCRIT: 38.7 % (ref 34.8–46.6)
HEMOGLOBIN: 11.9 g/dL (ref 11.6–15.9)
LYMPH#: 1.5 10*3/uL (ref 0.9–3.3)
LYMPH%: 36.7 % (ref 14.0–49.7)
MCH: 29.5 pg (ref 25.1–34.0)
MCHC: 30.7 g/dL — AB (ref 31.5–36.0)
MCV: 96 fL (ref 79.5–101.0)
MONO#: 0.5 10*3/uL (ref 0.1–0.9)
MONO%: 11.4 % (ref 0.0–14.0)
NEUT#: 2 10*3/uL (ref 1.5–6.5)
NEUT%: 48.5 % (ref 38.4–76.8)
Platelets: 85 10*3/uL — ABNORMAL LOW (ref 145–400)
RBC: 4.03 10*6/uL (ref 3.70–5.45)
RDW: 14.5 % (ref 11.2–14.5)
WBC: 4 10*3/uL (ref 3.9–10.3)

## 2015-03-25 LAB — UA PROTEIN, DIPSTICK - CHCC: Protein, ur: NEGATIVE mg/dL

## 2015-03-25 MED ORDER — SODIUM CHLORIDE 0.9 % IV SOLN
14.5000 mg/kg | Freq: Once | INTRAVENOUS | Status: AC
  Administered 2015-03-25: 1100 mg via INTRAVENOUS
  Filled 2015-03-25: qty 36

## 2015-03-25 MED ORDER — SODIUM CHLORIDE 0.9 % IJ SOLN
10.0000 mL | INTRAMUSCULAR | Status: DC | PRN
  Administered 2015-03-25: 10 mL
  Filled 2015-03-25: qty 10

## 2015-03-25 MED ORDER — SODIUM CHLORIDE 0.9% FLUSH
10.0000 mL | INTRAVENOUS | Status: DC | PRN
  Administered 2015-03-25: 10 mL via INTRAVENOUS
  Filled 2015-03-25: qty 10

## 2015-03-25 MED ORDER — HEPARIN SOD (PORK) LOCK FLUSH 100 UNIT/ML IV SOLN
500.0000 [IU] | Freq: Once | INTRAVENOUS | Status: AC | PRN
  Administered 2015-03-25: 500 [IU]
  Filled 2015-03-25: qty 5

## 2015-03-25 MED ORDER — SODIUM CHLORIDE 0.9 % IV SOLN
Freq: Once | INTRAVENOUS | Status: AC
  Administered 2015-03-25: 14:00:00 via INTRAVENOUS

## 2015-03-25 NOTE — Progress Notes (Signed)
Ok to treat today with PLT value today per Dr. Alen Blew.

## 2015-03-25 NOTE — Patient Instructions (Signed)

## 2015-03-26 LAB — CA 125: Cancer Antigen (CA) 125: 79.2 U/mL — ABNORMAL HIGH (ref 0.0–38.1)

## 2015-03-28 ENCOUNTER — Encounter: Payer: Self-pay | Admitting: Neurology

## 2015-03-28 ENCOUNTER — Ambulatory Visit (INDEPENDENT_AMBULATORY_CARE_PROVIDER_SITE_OTHER): Payer: PPO | Admitting: Neurology

## 2015-03-28 ENCOUNTER — Ambulatory Visit: Payer: PPO | Attending: Neurology | Admitting: Physical Therapy

## 2015-03-28 VITALS — BP 120/88 | HR 75 | Ht 62.0 in | Wt 161.4 lb

## 2015-03-28 DIAGNOSIS — R42 Dizziness and giddiness: Secondary | ICD-10-CM

## 2015-03-28 DIAGNOSIS — R269 Unspecified abnormalities of gait and mobility: Secondary | ICD-10-CM | POA: Diagnosis not present

## 2015-03-28 DIAGNOSIS — R2681 Unsteadiness on feet: Secondary | ICD-10-CM | POA: Diagnosis not present

## 2015-03-28 DIAGNOSIS — M436 Torticollis: Secondary | ICD-10-CM | POA: Insufficient documentation

## 2015-03-28 LAB — CANCER ANTIGEN 125 (PARALLEL TESTING): CA 125: 78 U/mL — ABNORMAL HIGH (ref <35)

## 2015-03-28 NOTE — Progress Notes (Signed)
[id number]    Provider:  Dr Jaynee Eagles Referring Provider: Carlos Levering, PA-C Primary Care Physician:  Wynelle Fanny  Provider: Dr Jaynee Eagles Referring Provider: Carlos Levering, PA-C Primary Care Physician: Wynelle Fanny  CC: dizziness  Interval history 03/28/2015: Patient returns today to review MRI findings. No findings on MRI of the brain or MRI of the cervical cord were acute and findings unlikely to cause her dizziness. Reviewed all images with patient and results. MRI of the brain showed cortical atrophy which is advanced for age as well as chronic microvascular ischemic changes more than expected for age. Advised patient to follow up with primary care and closely manage vascular risk factors. 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  IMPRESSION: This MRI of the brain with and without contrast shows the following: 1. Moderate cortical atrophy, advanced for age. 2. Moderate extent of T2/FLAIR hyperintense foci in the hemispheres most consistent with chronic microvascular ischemic change, more than expected for age. 3. There are no acute findings.   IMPRESSION: This MRI of the cervical spine with and without contrast shows the following: 1. 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 There is no nerve root impingement at these levels. 2. Degenerative changes as detailed above at C4-C5, C5-C6 and C6-C7. At C5-C6, there is mild spinal stenosis and moderately severe right foraminal narrowing that could lead to right C6 nerve root compression. 3. Small cyst within the left thyroid. 4. There is a normal enhancement pattern. There are no acute findings.   HPI: [name] is a 60 y.o. female here as a referral from Dr. Percell Miller for dizziness.  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 This was diagnosed in July of 2014.She is also status post lumpectomy for breast cancer diagnosed 25 years ago followed by radiation and chemotherapy carboplatin and Taxotere started on 10/01/2012. Avastin was added with cycle 4. This was discontinued in June of 2015. She is currently on Avastin maintenance only. Chemotherapy was discontinued in June 2015.  She has been having dizziness and trouble with balance for a while. She was advised to get a cane and has not. The dizziness started after chemotherapy, maybe last fall. In December she fell at the cancer center, she had her pole with her, she bent over and she lost balance and slid and hit her head. She hit her head and neck but no injury noted. She had a CT scan of the head and neck on December 2 and the results showed no fractures.She denies vertigo, no room spinning, more lightheadedness. She felt dizzy. She hit her head really hard, there was a hole in the wall. No LOC, no memory changes, no knots or bleeding or any visible head trauma. Since then she is more afraid to fall but the dizziness and lightheadedness has not worsened, it is stable as compared to pre-fall. She has no residual issues from that fall but she continues to have some dizziness and unsteadiness that caused her to fall. She feels lightheaded when she is walking or standing. Sometimes when she initially gets up. She has soreness in the thumbs since chemo and she is dropping things. She also has significant neck pain since chemotherapy.   Reviewed notes, labs and imaging from outside physicians, which  showed:  CT of the head 01/2015:personally reviewed images and agree with the following:  CT HEAD FINDINGS  Advanced for age cortical atrophy and chronic microvascular change are seen. No evidence of acute intracranial abnormality including hemorrhage, infarct,  mass lesion, mass effect, midline shift or abnormal extra-axial fluid collection is seen. There is no hydrocephalus or pneumocephalus. Mucosal thickening right sphenoid sinus is noted, unchanged. Imaged paranasal sinuses are otherwise clear. The calvarium is intact.  CT CERVICAL SPINE FINDINGS  No fracture or malalignment of the cervical spine is identified. There is loss of disc space height at all levels. Marked multilevel acet degenerative change is identified. The right C2-3 and left C2-3 and C3-4 facets are ankylosed. Lung apices are clear. Paraspinous soft tissue structures are unremarkable.  IMPRESSION: No acute abnormality head or cervical spine.  Advanced for age cortical atrophy and chronic microvascular ischemic change.  Multilevel cervical spondylosis.  CMP unremarkable, CBC with platelets 95.   Review of Systems: Patient complains of symptoms per HPI as well as the following symptoms: No CP, no SOB. Pertinent negatives per HPI. All others negative.  Social History   Social History  . Marital Status: Married    Spouse Name: [name]  . Number of Children: 0  . Years of Education: 14   Occupational History  . unemployed    Social History Main Topics  . Smoking status: Never Smoker   . Smokeless tobacco: Never Used  . Alcohol Use: 0.0 oz/week    0 Standard drinks or equivalent per week     Comment: very rarely  . Drug Use: No  . Sexual Activity: No   Other Topics Concern  . Not on file   Social History Narrative   Lives at home with husband.    Caffeine use:  Tea/soda occass    Family History  Problem Relation Age of Onset  . Breast cancer Maternal Aunt 30  . Diabetes Maternal Grandmother   . Heart disease Maternal Grandmother   . Heart disease Maternal Grandfather   . Hypertension Paternal Grandfather   . Breast cancer Maternal Aunt 70  . Breast cancer Maternal Aunt 74  . Ovarian cancer Neg Hx   . Colon cancer Neg Hx   . Allergies  Father   . Heart disease Mother     Past Medical History  Diagnosis Date  . Premature menopause   . Osteopenia 03/2009    t score -2.1 FRAX 4.6/0.4  . Hypothyroidism   . Depression   . Anxiety   . Hyperlipidemia   . Bipolar disorder ([place])   . ADD (attention deficit disorder)   . Hematochezia   . Allergy     seasonal  . Maintenance chemotherapy     Pt has chemo every 3 weeks (on Friday)  . Sleep apnea     mild  . Breast cancer ([place]) 1989    right breast  . Diabetes mellitus     pt denies DM noe meds    Past Surgical History  Procedure Laterality Date  . Breast surgery  1989    RIGHT LUMPECTOMY, RADIATION AND CHEMO  . Hysteroscopy  2011    Polyp  . Pelvic laparoscopy/ hysteroscopy  1996  . Foot surgery  2013    BILATERAL     Current Outpatient Prescriptions  Medication Sig Dispense Refill  . ALPRAZolam (XANAX) 1 MG tablet Take 1 mg by mouth at bedtime as needed for anxiety.    . betamethasone dipropionate (DIPROLENE) 0.05 %  cream Apply topically 2 (two) times daily. (Patient taking differently: Apply topically as needed. ) 30 g 0  . calcium carbonate (TUMS - DOSED IN MG ELEMENTAL CALCIUM) 500 MG chewable tablet Chew 1 tablet by mouth as needed for indigestion or heartburn.    . divalproex (DEPAKOTE) 250 MG DR tablet Take 250 mg by mouth 3 (three) times daily. Pt takes Depakote DR 250 mg, 2 tablets in the morning and 3 tablets in the evening    . gabapentin (NEURONTIN) 100 MG capsule TAKE ONE CAPSULE BY MOUTH THREE TIMES A DAY 90 capsule 1  . lamoTRIgine (LAMICTAL) 200 MG tablet Take 200 mg by mouth at bedtime.  1  . levothyroxine (SYNTHROID, LEVOTHROID) 100 MCG tablet Take 100 mcg by mouth daily before breakfast.     . lidocaine-prilocaine (EMLA) cream Apply topically as needed. Apply to port with every chemotherapy. 30 g 1  . loperamide (IMODIUM A-D) 2 MG tablet Take 2 mg by mouth as needed for diarrhea or loose stools.    . ondansetron (ZOFRAN ODT) 8 MG  disintegrating tablet Take 1 tablet (8 mg total) by mouth every 8 (eight) hours as needed for nausea or vomiting. 20 tablet 0  . rOPINIRole (REQUIP) 1 MG tablet Take 1 mg by mouth at bedtime.     No current facility-administered medications for this visit.    Allergies as of 03/28/2015 - Review Complete 03/21/2015  Allergen Reaction Noted  . Pollen extract Other (See Comments) 11/04/2014    Vitals: BP 120/88 mmHg  Pulse 75  Ht 5\' 2"  (1.575 m)  Wt 161 lb 6.4 oz (73.211 kg)  BMI 29.51 kg/m2 Last Weight:  Wt Readings from Last 1 Encounters:  03/28/15 161 lb 6.4 oz (73.211 kg)   Last Height:   Ht Readings from Last 1 Encounters:  03/28/15 5\' 2"  (1.575 m)    Neuro: Detailed Neurologic Exam  Speech:  Speech is normal; fluent and spontaneous with normal comprehension.  Cognition:  The patient is oriented to person, place, and time;   recent and remote memory intact;   language fluent;   normal attention, concentration,   fund of knowledge Cranial Nerves:  The pupils are equal, round, and reactive to light. The fundi are flat. Visual fields are full to finger confrontation. Extraocular movements are intact. Trigeminal sensation is intact and the muscles of mastication are normal. The face is symmetric. The palate elevates in the midline. Hearing intact. Voice is normal. Shoulder shrug is normal. The tongue has normal motion without fasciculations.   Coordination:  Normal finger to nose and heel to shin. Normal rapid alternating movements.   Gait:  Not ataxic  Motor Observation:  No asymmetry, no atrophy, and no involuntary movements noted. Tone:  Normal muscle tone.   Posture:  Posture is normal. normal erect   Strength:  Strength is V/V in the upper and lower limbs.    Sensation: Decr pinrick and temp distally in the feet. Vibration and pinprick intact.    Reflex Exam:  DTR's:  Deep tendon reflexes in the upper and  lower extremities are brisks  bilaterally.  Toes:  The toes are downgoing bilaterally.  Clonus:  Clonus is absent.   Marye Round Negative   Assessment/Plan: 60 year old female here with persistent dizziness and lightheadedness since chemotherapy. 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 This was diagnosed in July of 2014.She is also status post lumpectomy for breast cancer  diagnosed 25 years ago followed by radiation and chemotherapy carboplatin and Taxotere started on 10/01/2012. Avastin was added with cycle 4. This was discontinued in June of 2015. She is on Avastin maintenance only.  MRI of the brain and cervical cord did not show etiology for her dizziness. Differential could include autonomic neuropathy causing her dizziness. She does have small fiber changes distally in the lower extremities. Also contributory may be medication effects, she is on multiple medications that can cause dizziness including multiple AEDs , AED levels were normal.For this I do recommend physical therapy for gait and safety and balance. Would like patient to follow up with me after physical therapy for safety and balance to see how her dizziness is doing and at that point if we feel as though her symptoms are progressing can discuss further testing for possible autonomic neuropathy. Dix-hallpike was negative and she denies room spinning or vertigionous symptoms. Not c/w BPPV. Not orthostatic.  Needs physical therapy for imbalance, fall risk. Advised to use walking aid.   CC: Dr. Percell Miller    Assessment/Plan:    Sarina Ill, MD  [facility] [address] [place], [place] [address]  Phone [phone] Fax [phone]  A total of 30 minutes was spent face-to-face with this patient. Over half this time was spent on counseling patient on the dizziness diagnosis and different diagnostic and  therapeutic options available.

## 2015-03-28 NOTE — Patient Instructions (Addendum)
Flexibility: Upper Trapezius Stretch    Gently grasp right side of head while reaching behind back with other hand. Tilt head away until a gentle stretch is felt. Hold _ 30__ seconds. Repeat __4__ times per set. Do this __2__ times per day on each side.   Chin tuck exercise with towel    Lie on your back with a rolled up towel at the base of your head. Tuck your chin and push the back of your head into the towel. You should feel a deep stretch in the neck. Hold for 10 seconds. Relax. Repeat. Perform 10 reps per day.   "No Money" stretch   Lie on your back with no pillow under your head (if possible). Don't worry about putting the towel between shoulder blades (as pictured). With elbows by your sides, rotate arms outward with palms up. Hold for 10 seconds. Relax. Repeat. Perform 10 reps per day. You should feel a gentle stretch in the front of your shoulders/chest.

## 2015-03-28 NOTE — Therapy (Signed)
[facility] [address] [place], [place], [address] Phone: [phone]   Fax:  [phone]  Physical Therapy Treatment  Patient Details  Name: [name] MRN: [id number] Date of Birth: January 26, 1956 Referring Provider: Sarina Ill, MD  Encounter Date: 03/28/2015      PT End of Session - 03/28/15 1924    Visit Number 2   Number of Visits 13   Date for PT Re-Evaluation 05/20/15   Authorization Type Medicare - G Codes every 10 visits   PT Start Time 1540   PT Stop Time 1624   PT Time Calculation (min) 44 min   Activity Tolerance Patient tolerated treatment well   Behavior During Therapy [facility] for tasks assessed/performed      Past Medical History  Diagnosis Date  . Premature menopause   . Osteopenia 03/2009    t score -2.1 FRAX 4.6/0.4  . Hypothyroidism   . Depression   . Anxiety   . Hyperlipidemia   . Bipolar disorder ([place])   . ADD (attention deficit disorder)   . Hematochezia   . Allergy     seasonal  . Maintenance chemotherapy     Pt has chemo every 3 weeks (on Friday)  . Sleep apnea     mild  . Breast cancer ([place]) 1989    right breast  . Diabetes mellitus     pt denies DM noe meds    Past Surgical History  Procedure Laterality Date  . Breast surgery  1989    RIGHT LUMPECTOMY, RADIATION AND CHEMO  . Hysteroscopy  2011    Polyp  . Pelvic laparoscopy/ hysteroscopy  1996  . Foot surgery  2013    BILATERAL     There were no vitals filed for this visit.  Visit Diagnosis:  Stiffness of neck  Dizziness and giddiness      Subjective Assessment - 03/28/15 1546    Subjective Pt reports increased fatigue today. Just finished appt with Dr. Jaynee Eagles. Denies falls or significant changes.   Pertinent History PMH significant: omentum cancer (active; chemo every 3 weeks); h/o breast cancer s/p R lumpectomy (1989); bipolar disorder, anxiety, depression, OCD, ADD   Patient Stated Goals "To not be dizzy  and to be balanced."   Currently in Pain? No/denies                Vestibular Assessment - 03/28/15 0001    Orthostatics   BP supine (x 5 minutes) 133/73 mmHg   HR supine (x 5 minutes) 63   BP standing (after 1 minute) 147/83 mmHg  "just a little bit" dizzy, per pt   HR standing (after 1 minute) 78   BP standing (after 3 minutes) 142/109 mmHg  taken after 2 minutes; see below   HR standing (after 3 minutes) 76   Orthostatics Comment BP taken after 2 minutes (as opposed to 3 minutes) due to urgent need to get to bathroom.                 [facility] Adult PT Treatment/Exercise - 03/28/15 0001    Posture/Postural Control   Posture/Postural Control Postural limitations   Postural Limitations Rounded Shoulders;Forward head;Increased thoracic kyphosis;Posterior pelvic tilt;Flexed trunk   Posture Comments lower cervical spine flexion; upper cervical extension   Self-Care   Self-Care Other Self-Care Comments   Other Self-Care Comments  Education provided to emphasize on impact of postural alignment (cervical spine, thoracic spine, and B shoulders) on neck ROM. Discussed proper postural  alignment in seated. Suggested sleeping on back/side (as opposed to sleeping on stomach with BUE's above head, neck rotated to one side) to decrease shortening of SCM muscles. Cueing provided throughout session for increased awareness of postural alignment, with focus on cervical spine retraction and decreased B shoulder elevation.   Exercises   Exercises Other Exercises   Other Exercises  With verbal/demo cueing from this PT, pt performed one set of each home exercise. See Pt Instructions for detailed exercises, reps, sets, frequency/duration. Attempted B levator scapulae self-stretch. Pt able to successfully stretch R levator scap 4 x30-sec holds; however, unable to perform L levator stretch without increased pain in R aspect of cervical spine.   Manual Therapy   Manual Therapy Myofascial release    Manual therapy comments Supine: suboccipital release x4 minutes to increase extensibility of suboccipital musculature, promote more normalized posture. Contract-relax PNF alternating with prolonged passive stretch  (2 x30-sec holds per side) of B cervical spine rotation.Supine: gentle mobilization of first rib 3 x10-sec holds to increase cervic                PT Education - 03/28/15 1642    Education provided Yes   Education Details Initiated HEP with emphasis on postural alignment, cervical ROM.   Person(s) Educated Patient   Methods Explanation;Demonstration;Tactile cues;Verbal cues;Handout   Comprehension Verbalized understanding;Returned demonstration          PT Short Term Goals - 03/28/15 1555    PT SHORT TERM GOAL #1   Title Pt will perform initial HEP with mod I using paper handout to maximize functional gains made in PT.  (Target date: 04/18/15)   PT SHORT TERM GOAL #2   Title Rule out orthostatic hypotension as contributing factor to dizziness.  (Target date: 04/18/15)   Baseline Orthostatic hypotension ruled out 2/6.   Status Achieved   PT SHORT TERM GOAL #3   Title Pt will improve active cervical spine lateral flexion to 15 degrees bilaterally to progress toward normal neck ROM.  (Target date: 04/18/15)   Baseline 9 degrees to R;   6 degrees to L   PT SHORT TERM GOAL #4   Title Pt will improve active cervical spine rotation to 40 degrees bilaterally progress toward normal neck ROM.   (Target date: 04/18/15)   Baseline 26 degrees to R;   19 degrees to L   PT SHORT TERM GOAL #5   Title Pt will improve DGI score from 13/24 to 16/24 to indicate improved dynamic gait stability.  (Target date: 04/18/15)   PT SHORT TERM GOAL #6   Title Pt will improve gait speed from 2.42 ft/sec to > 2.62 ft/sec to indicate functional status of community ambulator.  (Target date: 04/18/15)           PT Long Term Goals - 03/21/15 2019    PT LONG TERM GOAL #1   Title Pt will improve  active cervical spine lateral flexion to 30 degrees bilaterally to progress toward normal neck ROM. (Target date: 05/16/15)   Baseline 9 degrees to R;   6 degrees to L   PT LONG TERM GOAL #2   Title Pt will improve active cervical spine rotation to 55 degrees bilaterally progress toward normal neck ROM.   (Target date: 05/16/15)   Baseline 26 degrees to R;   19 degrees to L   PT LONG TERM GOAL #3   Title Pt will improve DGI score from 13/24 to > / = 20/24 to indicate decreased  fall risk.    (Target date: 05/16/15)   PT LONG TERM GOAL #4   Title Pt will decrease DHI score from 72 to < / = 54 to indicate significant decrease in pt-perceived disability due to dizziness.   (Target date: 05/16/15)   PT LONG TERM GOAL #5   Title Pt will independently negotiate 2 stairs without rails to indicate pt abiliy to safely use primary home entrance.    (Target date: 05/16/15)   Additional Long Term Goals   Additional Long Term Goals Yes   PT LONG TERM GOAL #6   Title Pt will independently ambulate > 500' over unlevel, paved surfaces and negotiate standard ramp and curb step to indicate safety with community mobility.    (Target date: 05/16/15)               Plan - 03/28/15 1926    Clinical Impression Statement Skilled session focused on initiating HEP and education with emphasis on postural alignment and increasing extensibility of cervical spine musculature. Pt tolerated interventions without increased pain. Ruled out orthostatic hypotension as origin of symptoms.    Pt will benefit from skilled therapeutic intervention in order to improve on the following deficits Pain;Postural dysfunction;Decreased balance;Abnormal gait;Decreased knowledge of use of DME;Decreased range of motion;Decreased mobility;Hypomobility;Impaired sensation;Impaired flexibility;Decreased activity tolerance;Increased fascial restricitons;Decreased strength   Rehab Potential Good   Clinical Impairments Affecting Rehab Potential high  insurance copay; currently undergoing chemotherapy   PT Frequency Other (comment)  2x/week for 4 weeks then 1x/week for 4 weeks   PT Duration Other (comment)  See above.   PT Treatment/Interventions ADLs/Self Care Home Management;Vestibular;Manual techniques;Patient/family education;Neuromuscular re-education;Gait training;Stair training;Functional mobility training;Therapeutic activities;Therapeutic exercise;Balance training;DME Instruction   PT Next Visit Plan Assess HEP performance. Wouldn't add more home exercises without removing exercises (pt very fatigued due to chemo). Manual therapy and stretching to address soft tissue at neck to increase ROM.   Consulted and Agree with Plan of Care Patient        Problem List Patient Active Problem List   Diagnosis Date Noted  . Ovarian cancer ([place]) 08/13/2013  . Malignant neoplasm of female breast ([place]) 09/19/2012  . Lithium use 09/17/2011  . Abnormal coordination 09/17/2011  . Premature menopause   . Depression   . Anxiety   . Bipolar disorder ([place])   . ADD (attention deficit disorder)   . Diabetes mellitus   . NONSPECIFIC ABNORMAL ELECTROCARDIOGRAM 11/04/2009  . ALLERGIC RHINITIS 09/01/2009  . INCONTINENCE 09/01/2009  . DIAB W/O COMP TYPE II/UNS NOT STATED UNCNTRL 07/28/2008  . NONSPEC ELEVATION OF LEVELS OF TRANSAMINASE/LDH 07/28/2008  . MOTOR RESTLESSNESS 02/19/2008  . Internal hemorrhoids without mention of complication [id number]  . ESOPHAGEAL STRICTURE 09/15/2007  . DYSPHAGIA 09/15/2007  . OBSTRUCTIVE SLEEP APNEA 07/21/2007  . HYPERLIPIDEMIA 07/07/2007  . Osteopenia 02/20/2007  . HYPOTHYROIDISM 10/01/2006  . BREAST CANCER, HX OF 08/12/2006    Billie Ruddy, PT, DPT [facility] [address] [place], [place], [address] Phone: [phone]   Fax:  [phone] 03/28/2015, 7:34 PM  Name: [name] MRN: [id number] Date of Birth: 1955-12-31

## 2015-03-29 ENCOUNTER — Ambulatory Visit: Payer: 59 | Admitting: Oncology

## 2015-03-29 ENCOUNTER — Other Ambulatory Visit: Payer: 59

## 2015-03-29 ENCOUNTER — Ambulatory Visit: Payer: 59

## 2015-03-29 DIAGNOSIS — R42 Dizziness and giddiness: Secondary | ICD-10-CM | POA: Insufficient documentation

## 2015-04-01 ENCOUNTER — Encounter: Payer: Self-pay | Admitting: Physical Therapy

## 2015-04-01 ENCOUNTER — Ambulatory Visit: Payer: PPO | Admitting: Physical Therapy

## 2015-04-01 DIAGNOSIS — M436 Torticollis: Secondary | ICD-10-CM | POA: Diagnosis not present

## 2015-04-01 DIAGNOSIS — R2681 Unsteadiness on feet: Secondary | ICD-10-CM

## 2015-04-01 DIAGNOSIS — R42 Dizziness and giddiness: Secondary | ICD-10-CM

## 2015-04-01 DIAGNOSIS — R269 Unspecified abnormalities of gait and mobility: Secondary | ICD-10-CM

## 2015-04-02 NOTE — Therapy (Signed)
[facility] [address] [place], [place], [address] Phone: [phone]   Fax:  [phone]  Physical Therapy Treatment  Patient Details  Name: [name] MRN: [id number] Date of Birth: 13-Mar-1955 Referring Provider: Sarina Ill, MD  Encounter Date: 04/01/2015      PT End of Session - 04/01/15 1536    Visit Number 3   Number of Visits 13   Date for PT Re-Evaluation 05/20/15   Authorization Type Medicare - G Codes every 10 visits   PT Start Time [id number]   PT Stop Time 1615   PT Time Calculation (min) 40 min   Activity Tolerance Patient tolerated treatment well   Behavior During Therapy [facility] for tasks assessed/performed      Past Medical History  Diagnosis Date  . Premature menopause   . Osteopenia 03/2009    t score -2.1 FRAX 4.6/0.4  . Hypothyroidism   . Depression   . Anxiety   . Hyperlipidemia   . Bipolar disorder ([place])   . ADD (attention deficit disorder)   . Hematochezia   . Allergy     seasonal  . Maintenance chemotherapy     Pt has chemo every 3 weeks (on Friday)  . Sleep apnea     mild  . Breast cancer ([place]) 1989    right breast  . Diabetes mellitus     pt denies DM noe meds    Past Surgical History  Procedure Laterality Date  . Breast surgery  1989    RIGHT LUMPECTOMY, RADIATION AND CHEMO  . Hysteroscopy  2011    Polyp  . Pelvic laparoscopy/ hysteroscopy  1996  . Foot surgery  2013    BILATERAL     There were no vitals filed for this visit.  Visit Diagnosis:  Stiffness of neck  Dizziness and giddiness  Unsteadiness  Abnormality of gait      Subjective Assessment - 04/01/15 1535    Subjective No new complaints. No falls. No pain, just sore from exercises that Blaire gave last session.   Pertinent History PMH significant: omentum cancer (active; chemo every 3 weeks); h/o breast cancer s/p R lumpectomy (1989); bipolar disorder, anxiety, depression, OCD, ADD   Patient  Stated Goals "To not be dizzy and to be balanced."   Currently in Pain? No/denies   Pain Score 0-No pain            [facility] Adult PT Treatment/Exercise - 04/02/15 0001    Manual Therapy   Manual therapy comments focues on decreased muscle tightness and increased tissue extensibility and for decreased pain/                          Soft tissue mobilization to upper traps, rhomboids, cervical paraspinals   Myofascial Release to traps, rhomboids and cervical paraspinals.    Scapular Mobilization passively in all directions while seated edge of mat to promote increased tissue extensibility and scapular movement   Manual Traction in hooklying for increased muscle/tissue mobility/range of motion   Other Manual Therapy trigger point release and positional release to bil subscapular and upper traps for decreased pain and muscle tightness.   Neural Stretch passive stretching to STM, upper traps, rhomboids and scalenes in hook lying position to both sides.     exercises: Reviewed current HEP with minimal cues on correct technique.  Seated edge of mat: Upper trap neural stretch, 30 sec's x 3 reps each side Posterior shoulder  rolls x 10 reps Scapular retraction (gentle) , 5 sec hold x 10 reps        PT Short Term Goals - 03/28/15 1555    PT SHORT TERM GOAL #1   Title Pt will perform initial HEP with mod I using paper handout to maximize functional gains made in PT.  (Target date: 04/18/15)   PT SHORT TERM GOAL #2   Title Rule out orthostatic hypotension as contributing factor to dizziness.  (Target date: 04/18/15)   Baseline Orthostatic hypotension ruled out 2/6.   Status Achieved   PT SHORT TERM GOAL #3   Title Pt will improve active cervical spine lateral flexion to 15 degrees bilaterally to progress toward normal neck ROM.  (Target date: 04/18/15)   Baseline 9 degrees to R;   6 degrees to L   PT SHORT TERM GOAL #4   Title Pt will improve active cervical spine rotation to 40 degrees  bilaterally progress toward normal neck ROM.   (Target date: 04/18/15)   Baseline 26 degrees to R;   19 degrees to L   PT SHORT TERM GOAL #5   Title Pt will improve DGI score from 13/24 to 16/24 to indicate improved dynamic gait stability.  (Target date: 04/18/15)   PT SHORT TERM GOAL #6   Title Pt will improve gait speed from 2.42 ft/sec to > 2.62 ft/sec to indicate functional status of community ambulator.  (Target date: 04/18/15)           PT Long Term Goals - 03/21/15 2019    PT LONG TERM GOAL #1   Title Pt will improve active cervical spine lateral flexion to 30 degrees bilaterally to progress toward normal neck ROM. (Target date: 05/16/15)   Baseline 9 degrees to R;   6 degrees to L   PT LONG TERM GOAL #2   Title Pt will improve active cervical spine rotation to 55 degrees bilaterally progress toward normal neck ROM.   (Target date: 05/16/15)   Baseline 26 degrees to R;   19 degrees to L   PT LONG TERM GOAL #3   Title Pt will improve DGI score from 13/24 to > / = 20/24 to indicate decreased fall risk.    (Target date: 05/16/15)   PT LONG TERM GOAL #4   Title Pt will decrease DHI score from 72 to < / = 54 to indicate significant decrease in pt-perceived disability due to dizziness.   (Target date: 05/16/15)   PT LONG TERM GOAL #5   Title Pt will independently negotiate 2 stairs without rails to indicate pt abiliy to safely use primary home entrance.    (Target date: 05/16/15)   Additional Long Term Goals   Additional Long Term Goals Yes   PT LONG TERM GOAL #6   Title Pt will independently ambulate > 500' over unlevel, paved surfaces and negotiate standard ramp and curb step to indicate safety with community mobility.    (Target date: 05/16/15)            Plan - 04/01/15 1536    Clinical Impression Statement Pt able to demo current HEP with handouts and minimal cues on form/technique. Pt responded well to manual therapy for decreased muscular tightness and tolerated new  stretches/exercises performed in session today (not added to HEP). Pt iis making steady progress toward goals.   Pt will benefit from skilled therapeutic intervention in order to improve on the following deficits Pain;Postural dysfunction;Decreased balance;Abnormal gait;Decreased knowledge of use of DME;Decreased  range of motion;Decreased mobility;Hypomobility;Impaired sensation;Impaired flexibility;Decreased activity tolerance;Increased fascial restricitons;Decreased strength   Rehab Potential Good   Clinical Impairments Affecting Rehab Potential high insurance copay; currently undergoing chemotherapy   PT Frequency Other (comment)  2x/week for 4 weeks then 1x/week for 4 weeks   PT Duration Other (comment)  See above.   PT Treatment/Interventions ADLs/Self Care Home Management;Vestibular;Manual techniques;Patient/family education;Neuromuscular re-education;Gait training;Stair training;Functional mobility training;Therapeutic activities;Therapeutic exercise;Balance training;DME Instruction   PT Next Visit Plan . Manual therapy and stretching to address soft tissue at neck to increase ROM. progress to gentle strengthening as able. begin to address balance as well, all toward goals.   Consulted and Agree with Plan of Care Patient        Problem List Patient Active Problem List   Diagnosis Date Noted  . Dizziness 03/29/2015  . Ovarian cancer ([place]) 08/13/2013  . Malignant neoplasm of female breast ([place]) 09/19/2012  . Lithium use 09/17/2011  . Abnormal coordination 09/17/2011  . Premature menopause   . Depression   . Anxiety   . Bipolar disorder ([place])   . ADD (attention deficit disorder)   . Diabetes mellitus   . NONSPECIFIC ABNORMAL ELECTROCARDIOGRAM 11/04/2009  . ALLERGIC RHINITIS 09/01/2009  . INCONTINENCE 09/01/2009  . DIAB W/O COMP TYPE II/UNS NOT STATED UNCNTRL 07/28/2008  . NONSPEC ELEVATION OF LEVELS OF TRANSAMINASE/LDH 07/28/2008  . MOTOR RESTLESSNESS 02/19/2008  . Internal  hemorrhoids without mention of complication [id number]  . ESOPHAGEAL STRICTURE 09/15/2007  . DYSPHAGIA 09/15/2007  . OBSTRUCTIVE SLEEP APNEA 07/21/2007  . HYPERLIPIDEMIA 07/07/2007  . Osteopenia 02/20/2007  . HYPOTHYROIDISM 10/01/2006  . BREAST CANCER, HX OF 08/12/2006    Willow Ora 04/02/2015, 10:25 AM  Willow Ora, PTA, [facility] [address], [place], [place] [address] [phone] 04/02/2015, 10:25 AM   Name: [name] MRN: [id number] Date of Birth: [birth year]

## 2015-04-06 ENCOUNTER — Ambulatory Visit: Payer: PPO | Admitting: Physical Therapy

## 2015-04-06 ENCOUNTER — Encounter: Payer: Self-pay | Admitting: Physical Therapy

## 2015-04-06 DIAGNOSIS — R269 Unspecified abnormalities of gait and mobility: Secondary | ICD-10-CM

## 2015-04-06 DIAGNOSIS — R2681 Unsteadiness on feet: Secondary | ICD-10-CM

## 2015-04-06 DIAGNOSIS — M436 Torticollis: Secondary | ICD-10-CM | POA: Diagnosis not present

## 2015-04-06 DIAGNOSIS — R42 Dizziness and giddiness: Secondary | ICD-10-CM

## 2015-04-07 NOTE — Therapy (Signed)
[facility] [address] [place], [place], [address] Phone: [phone]   Fax:  [phone]  Physical Therapy Treatment  Patient Details  Name: [name] MRN: [id number] Date of Birth: 1955/09/08 Referring Provider: Sarina Ill, MD  Encounter Date: 04/06/2015      PT End of Session - 04/06/15 1322    Visit Number 4   Number of Visits 13   Date for PT Re-Evaluation 05/20/15   Authorization Type Medicare - G Codes every 10 visits   PT Start Time 1318   PT Stop Time 1400   PT Time Calculation (min) 42 min   Activity Tolerance Patient tolerated treatment well   Behavior During Therapy [facility] for tasks assessed/performed      Past Medical History  Diagnosis Date  . Premature menopause   . Osteopenia 03/2009    t score -2.1 FRAX 4.6/0.4  . Hypothyroidism   . Depression   . Anxiety   . Hyperlipidemia   . Bipolar disorder ([place])   . ADD (attention deficit disorder)   . Hematochezia   . Allergy     seasonal  . Maintenance chemotherapy     Pt has chemo every 3 weeks (on Friday)  . Sleep apnea     mild  . Breast cancer ([place]) 1989    right breast  . Diabetes mellitus     pt denies DM noe meds    Past Surgical History  Procedure Laterality Date  . Breast surgery  1989    RIGHT LUMPECTOMY, RADIATION AND CHEMO  . Hysteroscopy  2011    Polyp  . Pelvic laparoscopy/ hysteroscopy  1996  . Foot surgery  2013    BILATERAL     There were no vitals filed for this visit.  Visit Diagnosis:  Dizziness and giddiness  Unsteadiness  Abnormality of gait  Stiffness of neck      Subjective Assessment - 04/06/15 1321    Subjective No new complaints. No falls to report. No pain, just soreness in neck area. Does report her legs are starting to feel more weak/worse, especially with getting in/out of car.   Pertinent History PMH significant: omentum cancer (active; chemo every 3 weeks); h/o breast cancer s/p R  lumpectomy (1989); bipolar disorder, anxiety, depression, OCD, ADD   Patient Stated Goals "To not be dizzy and to be balanced."   Currently in Pain? No/denies   Pain Score 0-No pain             Balance Exercises - 04/06/15 1342    Balance Exercises: Standing   Standing Eyes Opened Narrow base of support (BOS);Wide (BOA);Head turns;Foam/compliant surface;Limitations   Standing Eyes Closed Narrow base of support (BOS);Wide (BOA);Head turns;Foam/compliant surface;Limitations   Rockerboard Anterior/posterior;Lateral;Head turns;EO;EC;Other time (comment);10 reps   Gait with Head Turns Forward;2 reps;Limitations   Balance Exercises: Standing   Standing Eyes Closed Limitations on airex: progressed from wide base of support to narrow base of support: EC no head movements, EO head movements up<>down, left<>right, EC head movements up<>down, left<>right with min to mod assist for balance.                                            Rebounder Limitations performed both ways on the balance board: hold steady EC, rocking EO, hold steady EC with head movements up<>down and left<>right with min to mod  assist for balance.                        Tandem Gait Limitations along a 50 foot hallway: forward gait with head turns left<>right and head nods up<>down x 2 laps each with min guard assist. Decreased gait speed noted with head movements             PT Short Term Goals - 03/28/15 1555    PT SHORT TERM GOAL #1   Title Pt will perform initial HEP with mod I using paper handout to maximize functional gains made in PT.  (Target date: 04/18/15)   PT SHORT TERM GOAL #2   Title Rule out orthostatic hypotension as contributing factor to dizziness.  (Target date: 04/18/15)   Baseline Orthostatic hypotension ruled out 2/6.   Status Achieved   PT SHORT TERM GOAL #3   Title Pt will improve active cervical spine lateral flexion to 15 degrees bilaterally to progress toward normal neck ROM.  (Target date:  04/18/15)   Baseline 9 degrees to R;   6 degrees to L   PT SHORT TERM GOAL #4   Title Pt will improve active cervical spine rotation to 40 degrees bilaterally progress toward normal neck ROM.   (Target date: 04/18/15)   Baseline 26 degrees to R;   19 degrees to L   PT SHORT TERM GOAL #5   Title Pt will improve DGI score from 13/24 to 16/24 to indicate improved dynamic gait stability.  (Target date: 04/18/15)   PT SHORT TERM GOAL #6   Title Pt will improve gait speed from 2.42 ft/sec to > 2.62 ft/sec to indicate functional status of community ambulator.  (Target date: 04/18/15)           PT Long Term Goals - 03/21/15 2019    PT LONG TERM GOAL #1   Title Pt will improve active cervical spine lateral flexion to 30 degrees bilaterally to progress toward normal neck ROM. (Target date: 05/16/15)   Baseline 9 degrees to R;   6 degrees to L   PT LONG TERM GOAL #2   Title Pt will improve active cervical spine rotation to 55 degrees bilaterally progress toward normal neck ROM.   (Target date: 05/16/15)   Baseline 26 degrees to R;   19 degrees to L   PT LONG TERM GOAL #3   Title Pt will improve DGI score from 13/24 to > / = 20/24 to indicate decreased fall risk.    (Target date: 05/16/15)   PT LONG TERM GOAL #4   Title Pt will decrease DHI score from 72 to < / = 54 to indicate significant decrease in pt-perceived disability due to dizziness.   (Target date: 05/16/15)   PT LONG TERM GOAL #5   Title Pt will independently negotiate 2 stairs without rails to indicate pt abiliy to safely use primary home entrance.    (Target date: 05/16/15)   Additional Long Term Goals   Additional Long Term Goals Yes   PT LONG TERM GOAL #6   Title Pt will independently ambulate > 500' over unlevel, paved surfaces and negotiate standard ramp and curb step to indicate safety with community mobility.    (Target date: 05/16/15)            Plan - 04/06/15 1323    Clinical Impression Statement Skilled session focused on  balance today with no issues reported. Pt did fatigue with activity, recovered quickly with  seated rest breaks. Pt demo's decreased balance reactions on complaint surfaces. Pt is making steady progress toward goals.   Pt will benefit from skilled therapeutic intervention in order to improve on the following deficits Pain;Postural dysfunction;Decreased balance;Abnormal gait;Decreased knowledge of use of DME;Decreased range of motion;Decreased mobility;Hypomobility;Impaired sensation;Impaired flexibility;Decreased activity tolerance;Increased fascial restricitons;Decreased strength   Rehab Potential Good   Clinical Impairments Affecting Rehab Potential high insurance copay; currently undergoing chemotherapy   PT Frequency Other (comment)  2x/week for 4 weeks then 1x/week for 4 weeks   PT Duration Other (comment)  See above.   PT Treatment/Interventions ADLs/Self Care Home Management;Vestibular;Manual techniques;Patient/family education;Neuromuscular re-education;Gait training;Stair training;Functional mobility training;Therapeutic activities;Therapeutic exercise;Balance training;DME Instruction   PT Next Visit Plan manual therapy as needed for neck pain/ROM, continue to work on balance retraining as well   Consulted and Agree with Plan of Care Patient        Problem List Patient Active Problem List   Diagnosis Date Noted  . Dizziness 03/29/2015  . Ovarian cancer ([place]) 08/13/2013  . Malignant neoplasm of female breast ([place]) 09/19/2012  . Lithium use 09/17/2011  . Abnormal coordination 09/17/2011  . Premature menopause   . Depression   . Anxiety   . Bipolar disorder ([place])   . ADD (attention deficit disorder)   . Diabetes mellitus   . NONSPECIFIC ABNORMAL ELECTROCARDIOGRAM 11/04/2009  . ALLERGIC RHINITIS 09/01/2009  . INCONTINENCE 09/01/2009  . DIAB W/O COMP TYPE II/UNS NOT STATED UNCNTRL 07/28/2008  . NONSPEC ELEVATION OF LEVELS OF TRANSAMINASE/LDH 07/28/2008  . MOTOR RESTLESSNESS  02/19/2008  . Internal hemorrhoids without mention of complication [id number]  . ESOPHAGEAL STRICTURE 09/15/2007  . DYSPHAGIA 09/15/2007  . OBSTRUCTIVE SLEEP APNEA 07/21/2007  . HYPERLIPIDEMIA 07/07/2007  . Osteopenia 02/20/2007  . HYPOTHYROIDISM 10/01/2006  . BREAST CANCER, HX OF 08/12/2006    Willow Ora 04/07/2015, 1:09 PM  Willow Ora, PTA, [facility] [address] [place], [place] [address] [phone] 04/07/2015, 1:09 PM   Name: EVAH BUTRICK MRN: [id number] Date of Birth: 01-06-1956

## 2015-04-08 ENCOUNTER — Encounter: Payer: Self-pay | Admitting: Physical Therapy

## 2015-04-08 ENCOUNTER — Ambulatory Visit: Payer: PPO | Admitting: Physical Therapy

## 2015-04-08 DIAGNOSIS — R42 Dizziness and giddiness: Secondary | ICD-10-CM

## 2015-04-08 DIAGNOSIS — R2681 Unsteadiness on feet: Secondary | ICD-10-CM

## 2015-04-08 DIAGNOSIS — R269 Unspecified abnormalities of gait and mobility: Secondary | ICD-10-CM

## 2015-04-08 DIAGNOSIS — M436 Torticollis: Secondary | ICD-10-CM | POA: Diagnosis not present

## 2015-04-10 NOTE — Therapy (Signed)
[facility] [address] [place], [place], [address] Phone: [phone]   Fax:  [phone]  Physical Therapy Treatment  Patient Details  Name: [name] MRN: [id number] Date of Birth: 1955-06-13 Referring Provider: Sarina Ill, MD  Encounter Date: 04/08/2015   04/08/15 1321  PT Visits / Re-Eval  Visit Number 5  Number of Visits 13  Date for PT Re-Evaluation 05/20/15  Authorization  Authorization Type Medicare - G Codes every 10 visits  PT Time Calculation  PT Start Time 1318  PT Stop Time 1358  PT Time Calculation (min) 40 min  PT - End of Session  Activity Tolerance Patient tolerated treatment well  Behavior During Therapy [facility] for tasks assessed/performed      Past Medical History  Diagnosis Date  . Premature menopause   . Osteopenia 03/2009    t score -2.1 FRAX 4.6/0.4  . Hypothyroidism   . Depression   . Anxiety   . Hyperlipidemia   . Bipolar disorder ([place])   . ADD (attention deficit disorder)   . Hematochezia   . Allergy     seasonal  . Maintenance chemotherapy     Pt has chemo every 3 weeks (on Friday)  . Sleep apnea     mild  . Breast cancer ([place]) 1989    right breast  . Diabetes mellitus     pt denies DM noe meds    Past Surgical History  Procedure Laterality Date  . Breast surgery  1989    RIGHT LUMPECTOMY, RADIATION AND CHEMO  . Hysteroscopy  2011    Polyp  . Pelvic laparoscopy/ hysteroscopy  1996  . Foot surgery  2013    BILATERAL     There were no vitals filed for this visit.  Visit Diagnosis:   Dizziness and giddiness     Unsteadiness   .  Abnormality of gait      04/08/15 1320  Symptoms/Limitations  Subjective No new complaints. Still with soreness in neck, no pain to report. No falls to report. No dizziness currently or within the last 24 hours.  Patient Stated Goals "To not be dizzy and to be balanced."  Pain Assessment  Currently in Pain? No/denies   Pain Score 0     04/08/15 1323  Ambulation/Gait  Ambulation/Gait Yes  Ambulation/Gait Assistance 5: Supervision  Ambulation/Gait Assistance Details occasional cues on posture and step length. cues to maintain gait speed while scanning enviroment                  Ambulation Distance (Feet) 600 Feet  Assistive device None  Gait Pattern Step-through pattern;Decreased stride length;Decreased trunk rotation  Ambulation Surface Level;Unlevel;Indoor;Outdoor;Paved;Grass (pine needles)  Knee/Hip Exercises: Aerobic  Other Aerobic Scifit x 4 extremities level 1.2 x 10 minutes with goal rpm 10-15 for strengthening and activity tolerance.      04/08/15 1330  Balance Exercises: Standing  Other Standing Exercises standing on airex: using red theraband rows, shoulder extension and horizontal shoulder abduction x 10 reps each with cues on posture and equal LE weight bearing. cues to keep shoulder down, not allow them to elevate with exercises.                  Balance Exercises: Standing  Tandem Gait Limitations along a 50 foot hallway: forward gait with head turns left<>right and head nods up<>down x 4 laps each with min guard assist. Decreased gait speed noted with head movements  PT Short Term Goals - 03/28/15 1555    PT SHORT TERM GOAL #1   Title Pt will perform initial HEP with mod I using paper handout to maximize functional gains made in PT.  (Target date: 04/18/15)   PT SHORT TERM GOAL #2   Title Rule out orthostatic hypotension as contributing factor to dizziness.  (Target date: 04/18/15)   Baseline Orthostatic hypotension ruled out 2/6.   Status Achieved   PT SHORT TERM GOAL #3   Title Pt will improve active cervical spine lateral flexion to 15 degrees bilaterally to progress toward normal neck ROM.  (Target date: 04/18/15)   Baseline 9 degrees to R;   6 degrees to L   PT SHORT TERM GOAL #4   Title Pt will improve active cervical spine rotation to 40 degrees bilaterally  progress toward normal neck ROM.   (Target date: 04/18/15)   Baseline 26 degrees to R;   19 degrees to L   PT SHORT TERM GOAL #5   Title Pt will improve DGI score from 13/24 to 16/24 to indicate improved dynamic gait stability.  (Target date: 04/18/15)   PT SHORT TERM GOAL #6   Title Pt will improve gait speed from 2.42 ft/sec to > 2.62 ft/sec to indicate functional status of community ambulator.  (Target date: 04/18/15)           PT Long Term Goals - 03/21/15 2019    PT LONG TERM GOAL #1   Title Pt will improve active cervical spine lateral flexion to 30 degrees bilaterally to progress toward normal neck ROM. (Target date: 05/16/15)   Baseline 9 degrees to R;   6 degrees to L   PT LONG TERM GOAL #2   Title Pt will improve active cervical spine rotation to 55 degrees bilaterally progress toward normal neck ROM.   (Target date: 05/16/15)   Baseline 26 degrees to R;   19 degrees to L   PT LONG TERM GOAL #3   Title Pt will improve DGI score from 13/24 to > / = 20/24 to indicate decreased fall risk.    (Target date: 05/16/15)   PT LONG TERM GOAL #4   Title Pt will decrease DHI score from 72 to < / = 54 to indicate significant decrease in pt-perceived disability due to dizziness.   (Target date: 05/16/15)   PT LONG TERM GOAL #5   Title Pt will independently negotiate 2 stairs without rails to indicate pt abiliy to safely use primary home entrance.    (Target date: 05/16/15)   Additional Long Term Goals   Additional Long Term Goals Yes   PT LONG TERM GOAL #6   Title Pt will independently ambulate > 500' over unlevel, paved surfaces and negotiate standard ramp and curb step to indicate safety with community mobility.    (Target date: 05/16/15)        04/08/15 1322  Plan  Clinical Impression Statement Continued to work on balance and gait today with no issues reported. Pt continues to fatigue quickly, needing rest breaks. Pt is making steady progress toward goals.  Pt will benefit from skilled  therapeutic intervention in order to improve on the following deficits Pain;Postural dysfunction;Decreased balance;Abnormal gait;Decreased knowledge of use of DME;Decreased range of motion;Decreased mobility;Hypomobility;Impaired sensation;Impaired flexibility;Decreased activity tolerance;Increased fascial restricitons;Decreased strength  Rehab Potential Good  Clinical Impairments Affecting Rehab Potential high insurance copay; currently undergoing chemotherapy  PT Frequency Other (comment) (2x/week for 4 weeks then 1x/week for 4 weeks)  PT  Duration Other (comment) (See above.)  PT Treatment/Interventions ADLs/Self Care Home Management;Vestibular;Manual techniques;Patient/family education;Neuromuscular re-education;Gait training;Stair training;Functional mobility training;Therapeutic activities;Therapeutic exercise;Balance training;DME Instruction  PT Next Visit Plan manual therapy as needed for neck pain/ROM, continue to work on balance retraining as well  Consulted and Agree with Plan of Care Patient       Problem List Patient Active Problem List   Diagnosis Date Noted  . Dizziness 03/29/2015  . Ovarian cancer ([place]) 08/13/2013  . Malignant neoplasm of female breast ([place]) 09/19/2012  . Lithium use 09/17/2011  . Abnormal coordination 09/17/2011  . Premature menopause   . Depression   . Anxiety   . Bipolar disorder ([place])   . ADD (attention deficit disorder)   . Diabetes mellitus   . NONSPECIFIC ABNORMAL ELECTROCARDIOGRAM 11/04/2009  . ALLERGIC RHINITIS 09/01/2009  . INCONTINENCE 09/01/2009  . DIAB W/O COMP TYPE II/UNS NOT STATED UNCNTRL 07/28/2008  . NONSPEC ELEVATION OF LEVELS OF TRANSAMINASE/LDH 07/28/2008  . MOTOR RESTLESSNESS 02/19/2008  . Internal hemorrhoids without mention of complication [id number]  . ESOPHAGEAL STRICTURE 09/15/2007  . DYSPHAGIA 09/15/2007  . OBSTRUCTIVE SLEEP APNEA 07/21/2007  . HYPERLIPIDEMIA 07/07/2007  . Osteopenia 02/20/2007  . HYPOTHYROIDISM  10/01/2006  . BREAST CANCER, HX OF 08/12/2006    Willow Ora 04/10/2015, 10:17 PM  Willow Ora, PTA, [facility] [address], [place],  [address] [phone] 04/10/2015, 10:21 PM   Name: HAYDAN STYS MRN: [id number] Date of Birth: 1955-11-08

## 2015-04-11 ENCOUNTER — Encounter: Payer: Self-pay | Admitting: Physical Therapy

## 2015-04-11 ENCOUNTER — Ambulatory Visit: Payer: PPO | Admitting: Physical Therapy

## 2015-04-11 DIAGNOSIS — M436 Torticollis: Secondary | ICD-10-CM | POA: Diagnosis not present

## 2015-04-11 DIAGNOSIS — R269 Unspecified abnormalities of gait and mobility: Secondary | ICD-10-CM

## 2015-04-11 DIAGNOSIS — R2681 Unsteadiness on feet: Secondary | ICD-10-CM

## 2015-04-11 DIAGNOSIS — R42 Dizziness and giddiness: Secondary | ICD-10-CM

## 2015-04-11 NOTE — Therapy (Signed)
[facility] [address] [place], [place], [address] Phone: [phone]   Fax:  [phone]  Physical Therapy Treatment  Patient Details  Name: [name] MRN: [id number] Date of Birth: [birth year] Referring Provider: Sarina Ill, MD  Encounter Date: 04/11/2015      PT End of Session - 04/11/15 1235    Visit Number 6   Number of Visits 13   Date for PT Re-Evaluation 05/20/15   Authorization Type Medicare - G Codes every 10 visits   PT Start Time 1233   PT Stop Time 1315   PT Time Calculation (min) 42 min   Equipment Utilized During Treatment Gait belt   Activity Tolerance Patient tolerated treatment well   Behavior During Therapy [facility] for tasks assessed/performed      Past Medical History  Diagnosis Date  . Premature menopause   . Osteopenia 03/2009    t score -2.1 FRAX 4.6/0.4  . Hypothyroidism   . Depression   . Anxiety   . Hyperlipidemia   . Bipolar disorder ([place])   . ADD (attention deficit disorder)   . Hematochezia   . Allergy     seasonal  . Maintenance chemotherapy     Pt has chemo every 3 weeks (on Friday)  . Sleep apnea     mild  . Breast cancer ([place]) 1989    right breast  . Diabetes mellitus     pt denies DM noe meds    Past Surgical History  Procedure Laterality Date  . Breast surgery  1989    RIGHT LUMPECTOMY, RADIATION AND CHEMO  . Hysteroscopy  2011    Polyp  . Pelvic laparoscopy/ hysteroscopy  1996  . Foot surgery  2013    BILATERAL     There were no vitals filed for this visit.  Visit Diagnosis:  Stiffness of neck  Abnormality of gait  Unsteadiness  Dizziness and giddiness      Subjective Assessment - 04/11/15 1234    Subjective No new complaints. Still with tightness in neck, no pain to report. No falls to report. No dizziness currently or within the last several days.   Pertinent History PMH significant: omentum cancer (active; chemo every 3 weeks); h/o  breast cancer s/p R lumpectomy (1989); bipolar disorder, anxiety, depression, OCD, ADD   Patient Stated Goals "To not be dizzy and to be balanced."   Currently in Pain? No/denies   Pain Score 0-No pain            [facility] Adult PT Treatment/Exercise - 04/11/15 1236    Ambulation/Gait   Ambulation/Gait Yes   Ambulation/Gait Assistance 5: Supervision;4: Min guard   Ambulation/Gait Assistance Details gait in hallway with head movements up<>down, left<>right x 2 laps each, cues on maintaining gait speed and pathway                               Ambulation Distance (Feet) 50 Feet  x 4 laps   Assistive device None   Gait Pattern Step-through pattern;Decreased stride length   Ambulation Surface Level;Indoor   Manual Therapy   Manual therapy comments focues on decreased muscle tightness and increased tissue extensibility and for decreased pain/                          Soft tissue mobilization to upper traps, rhomboids, cervical paraspinals   Myofascial Release to traps,  rhomboids and cervical paraspinals.    Manual Traction in hooklying for increased muscle/tissue mobility/range of motion   Other Manual Therapy suboccipital release and  positional release to bil subscapular and upper traps for decreased pain and muscle tightness.   Neural Stretch passive stretching to STM, upper traps, rhomboids and scalenes in hook lying position to both sides.           Balance Exercises - 04/11/15 1314    Balance Exercises: Standing   Standing Eyes Closed Narrow base of support (BOS);Foam/compliant surface;Other reps (comment);Limitations   Balance Exercises: Standing   Standing Eyes Closed Limitations on airex: narrow base of support with head movements up<>down, left<>right and diagonals both ways with up to min assist and cues for weight shifting                                               PT Short Term Goals - 03/28/15 1555    PT SHORT TERM GOAL #1   Title Pt will perform initial HEP  with mod I using paper handout to maximize functional gains made in PT.  (Target date: 04/18/15)   PT SHORT TERM GOAL #2   Title Rule out orthostatic hypotension as contributing factor to dizziness.  (Target date: 04/18/15)   Baseline Orthostatic hypotension ruled out 2/6.   Status Achieved   PT SHORT TERM GOAL #3   Title Pt will improve active cervical spine lateral flexion to 15 degrees bilaterally to progress toward normal neck ROM.  (Target date: 04/18/15)   Baseline 9 degrees to R;   6 degrees to L   PT SHORT TERM GOAL #4   Title Pt will improve active cervical spine rotation to 40 degrees bilaterally progress toward normal neck ROM.   (Target date: 04/18/15)   Baseline 26 degrees to R;   19 degrees to L   PT SHORT TERM GOAL #5   Title Pt will improve DGI score from 13/24 to 16/24 to indicate improved dynamic gait stability.  (Target date: 04/18/15)   PT SHORT TERM GOAL #6   Title Pt will improve gait speed from 2.42 ft/sec to > 2.62 ft/sec to indicate functional status of community ambulator.  (Target date: 04/18/15)           PT Long Term Goals - 03/21/15 2019    PT LONG TERM GOAL #1   Title Pt will improve active cervical spine lateral flexion to 30 degrees bilaterally to progress toward normal neck ROM. (Target date: 05/16/15)   Baseline 9 degrees to R;   6 degrees to L   PT LONG TERM GOAL #2   Title Pt will improve active cervical spine rotation to 55 degrees bilaterally progress toward normal neck ROM.   (Target date: 05/16/15)   Baseline 26 degrees to R;   19 degrees to L   PT LONG TERM GOAL #3   Title Pt will improve DGI score from 13/24 to > / = 20/24 to indicate decreased fall risk.    (Target date: 05/16/15)   PT LONG TERM GOAL #4   Title Pt will decrease DHI score from 72 to < / = 54 to indicate significant decrease in pt-perceived disability due to dizziness.   (Target date: 05/16/15)   PT LONG TERM GOAL #5   Title Pt will independently negotiate 2 stairs without rails to  indicate  pt abiliy to safely use primary home entrance.    (Target date: 05/16/15)   Additional Long Term Goals   Additional Long Term Goals Yes   PT LONG TERM GOAL #6   Title Pt will independently ambulate > 500' over unlevel, paved surfaces and negotiate standard ramp and curb step to indicate safety with community mobility.    (Target date: 05/16/15)            Plan - 04/11/15 1235    Clinical Impression Statement Skilled session focused on decreased cervical tightness and on high level  balance with fatigue reported, no pain. Pt is making steady progress toward goals, however does continue to demo decreased bil cervical rotation.   Pt will benefit from skilled therapeutic intervention in order to improve on the following deficits Pain;Postural dysfunction;Decreased balance;Abnormal gait;Decreased knowledge of use of DME;Decreased range of motion;Decreased mobility;Hypomobility;Impaired sensation;Impaired flexibility;Decreased activity tolerance;Increased fascial restricitons;Decreased strength   Rehab Potential Good   Clinical Impairments Affecting Rehab Potential high insurance copay; currently undergoing chemotherapy   PT Frequency Other (comment)  2x/week for 4 weeks then 1x/week for 4 weeks   PT Duration Other (comment)  See above.   PT Treatment/Interventions ADLs/Self Care Home Management;Vestibular;Manual techniques;Patient/family education;Neuromuscular re-education;Gait training;Stair training;Functional mobility training;Therapeutic activities;Therapeutic exercise;Balance training;DME Instruction   PT Next Visit Plan manual therapy as needed for neck pain/ROM, continue to work on balance retraining as well   Consulted and Agree with Plan of Care Patient        Problem List Patient Active Problem List   Diagnosis Date Noted  . Dizziness 03/29/2015  . Ovarian cancer ([place]) 08/13/2013  . Malignant neoplasm of female breast ([place]) 09/19/2012  . Lithium use 09/17/2011  .  Abnormal coordination 09/17/2011  . Premature menopause   . Depression   . Anxiety   . Bipolar disorder ([place])   . ADD (attention deficit disorder)   . Diabetes mellitus   . NONSPECIFIC ABNORMAL ELECTROCARDIOGRAM 11/04/2009  . ALLERGIC RHINITIS 09/01/2009  . INCONTINENCE 09/01/2009  . DIAB W/O COMP TYPE II/UNS NOT STATED UNCNTRL 07/28/2008  . NONSPEC ELEVATION OF LEVELS OF TRANSAMINASE/LDH 07/28/2008  . MOTOR RESTLESSNESS 02/19/2008  . Internal hemorrhoids without mention of complication [id number]  . ESOPHAGEAL STRICTURE 09/15/2007  . DYSPHAGIA 09/15/2007  . OBSTRUCTIVE SLEEP APNEA 07/21/2007  . HYPERLIPIDEMIA 07/07/2007  . Osteopenia 02/20/2007  . HYPOTHYROIDISM 10/01/2006  . BREAST CANCER, HX OF 08/12/2006    Willow Ora 04/11/2015, 3:36 PM  Willow Ora, PTA, [facility] [address], [place], [place] [address] [phone] 04/11/2015, 3:37 PM   Name: [name] MRN: [id number] Date of Birth: September 12, 1955

## 2015-04-13 ENCOUNTER — Encounter: Payer: Self-pay | Admitting: Physical Therapy

## 2015-04-13 ENCOUNTER — Ambulatory Visit: Payer: PPO | Admitting: Physical Therapy

## 2015-04-13 DIAGNOSIS — M436 Torticollis: Secondary | ICD-10-CM | POA: Diagnosis not present

## 2015-04-13 DIAGNOSIS — R42 Dizziness and giddiness: Secondary | ICD-10-CM

## 2015-04-13 DIAGNOSIS — R2681 Unsteadiness on feet: Secondary | ICD-10-CM

## 2015-04-13 NOTE — Therapy (Signed)
[facility] [address] [place], [place], [address] Phone: [phone]   Fax:  [phone]  Physical Therapy Treatment  Patient Details  Name: [name] MRN: [id number] Date of Birth: 03-24-1955 Referring Provider: Sarina Ill, MD  Encounter Date: 04/13/2015      PT End of Session - 04/13/15 1407    Visit Number 7   Number of Visits 13   Date for PT Re-Evaluation 05/20/15   Authorization Type Medicare - G Codes every 10 visits   PT Start Time 1310   PT Stop Time 1400   PT Time Calculation (min) 50 min   Equipment Utilized During Treatment Gait belt   Activity Tolerance Patient tolerated treatment well;No increased pain;Patient limited by fatigue   Behavior During Therapy [facility] for tasks assessed/performed      Past Medical History  Diagnosis Date  . Premature menopause   . Osteopenia 03/2009    t score -2.1 FRAX 4.6/0.4  . Hypothyroidism   . Depression   . Anxiety   . Hyperlipidemia   . Bipolar disorder ([place])   . ADD (attention deficit disorder)   . Hematochezia   . Allergy     seasonal  . Maintenance chemotherapy     Pt has chemo every 3 weeks (on Friday)  . Sleep apnea     mild  . Breast cancer ([place]) 1989    right breast  . Diabetes mellitus     pt denies DM noe meds    Past Surgical History  Procedure Laterality Date  . Breast surgery  1989    RIGHT LUMPECTOMY, RADIATION AND CHEMO  . Hysteroscopy  2011    Polyp  . Pelvic laparoscopy/ hysteroscopy  1996  . Foot surgery  2013    BILATERAL     There were no vitals filed for this visit.  Visit Diagnosis:  Stiffness of neck  Dizziness and giddiness  Unsteadiness      Subjective Assessment - 04/13/15 1346    Subjective No new complaints. Still with tightness in neck, same, constant paoin. No falls to report. No dizziness currently or within the last several days.   Pertinent History PMH significant: omentum cancer (active;  chemo every 3 weeks); h/o breast cancer s/p R lumpectomy (1989); bipolar disorder, anxiety, depression, OCD, ADD   Patient Stated Goals "To not be dizzy and to be balanced."   Pain Score 6    Pain Location Neck   Pain Orientation Posterior   Pain Descriptors / Indicators Aching   Pain Frequency Constant           [facility] Adult PT Treatment/Exercise - 04/13/15 1352    Neuro Re-ed    Neuro Re-ed Details  Corner exercises   Manual Therapy   Manual therapy comments focues on decreased muscle tightness and increased tissue extensibility and for decreased pain/                          Soft tissue mobilization to upper traps, rhomboids, cervical paraspinals   Myofascial Release to traps, rhomboids and cervical paraspinals.    Manual Traction in hooklying for increased muscle/tissue mobility/range of motion   Other Manual Therapy suboccipital release and  positional release to bil subscapular and upper traps for decreased pain and muscle tightness.   Ambulation   Ambulation/Gait Assistance Details Other (comment)  630' supervision to CGA, 400' with head turns     NMR:  Corner exercises with  eyes closed, eyes closed vertical, eyes closed horizontal, diagonal, alternating UE raises, BUE raises all for 1 set of 10 reps or 30 seconds to decrease fall risk and improve balance necessary for community ambulation.  400' with head turns up/down, left/right, diagonals (performed multiple laps along long hallway, 2 laps forward each without rest breaks)  Gait:  Ambulation 630' supervision to CGA with verbal cues for increased step length and increased heel strike and tactile cues for smooth turns with patient displaying shortened step length as each trial of gait continued due to fatigue resulting in one instance of LOB due to tripping with patient recovery using step strategy.        PT Short Term Goals - 03/28/15 1555    PT SHORT TERM GOAL #1   Title Pt will perform initial HEP with mod I using  paper handout to maximize functional gains made in PT.  (Target date: 04/18/15)   PT SHORT TERM GOAL #2   Title Rule out orthostatic hypotension as contributing factor to dizziness.  (Target date: 04/18/15)   Baseline Orthostatic hypotension ruled out 2/6.   Status Achieved   PT SHORT TERM GOAL #3   Title Pt will improve active cervical spine lateral flexion to 15 degrees bilaterally to progress toward normal neck ROM.  (Target date: 04/18/15)   Baseline 9 degrees to R;   6 degrees to L   PT SHORT TERM GOAL #4   Title Pt will improve active cervical spine rotation to 40 degrees bilaterally progress toward normal neck ROM.   (Target date: 04/18/15)   Baseline 26 degrees to R;   19 degrees to L   PT SHORT TERM GOAL #5   Title Pt will improve DGI score from 13/24 to 16/24 to indicate improved dynamic gait stability.  (Target date: 04/18/15)   PT SHORT TERM GOAL #6   Title Pt will improve gait speed from 2.42 ft/sec to > 2.62 ft/sec to indicate functional status of community ambulator.  (Target date: 04/18/15)           PT Long Term Goals - 03/21/15 2019    PT LONG TERM GOAL #1   Title Pt will improve active cervical spine lateral flexion to 30 degrees bilaterally to progress toward normal neck ROM. (Target date: 05/16/15)   Baseline 9 degrees to R;   6 degrees to L   PT LONG TERM GOAL #2   Title Pt will improve active cervical spine rotation to 55 degrees bilaterally progress toward normal neck ROM.   (Target date: 05/16/15)   Baseline 26 degrees to R;   19 degrees to L   PT LONG TERM GOAL #3   Title Pt will improve DGI score from 13/24 to > / = 20/24 to indicate decreased fall risk.    (Target date: 05/16/15)   PT LONG TERM GOAL #4   Title Pt will decrease DHI score from 72 to < / = 54 to indicate significant decrease in pt-perceived disability due to dizziness.   (Target date: 05/16/15)   PT LONG TERM GOAL #5   Title Pt will independently negotiate 2 stairs without rails to indicate pt abiliy  to safely use primary home entrance.    (Target date: 05/16/15)   Additional Long Term Goals   Additional Long Term Goals Yes   PT LONG TERM GOAL #6   Title Pt will independently ambulate > 500' over unlevel, paved surfaces and negotiate standard ramp and curb step to indicate safety with  community mobility.    (Target date: 05/16/15)           Plan - 04/13/15 1408    Clinical Impression Statement Skilled session addessing improving neck tightness and pain, balance, and gait performance with a focus in improving fatigue. Patient is making steady progress toward goals and is notably less fatigued with treatment than previous sessions.    Pt will benefit from skilled therapeutic intervention in order to improve on the following deficits Pain;Postural dysfunction;Decreased balance;Abnormal gait;Decreased knowledge of use of DME;Decreased range of motion;Decreased mobility;Hypomobility;Impaired sensation;Impaired flexibility;Decreased activity tolerance;Increased fascial restricitons;Decreased strength   Rehab Potential Good   Clinical Impairments Affecting Rehab Potential high insurance copay; currently undergoing chemotherapy   PT Frequency Other (comment)  2x/week for 4 weeks then 1x/week for 4 weeks   PT Duration Other (comment)  See above.   PT Treatment/Interventions ADLs/Self Care Home Management;Vestibular;Manual techniques;Patient/family education;Neuromuscular re-education;Gait training;Stair training;Functional mobility training;Therapeutic activities;Therapeutic exercise;Balance training;DME Instruction   PT Next Visit Plan manual therapy as needed for neck pain/ROM, continue to work on balance retraining as well, address STGs due next treatment session.   Consulted and Agree with Plan of Care Patient        Problem List Patient Active Problem List   Diagnosis Date Noted  . Dizziness 03/29/2015  . Ovarian cancer ([place]) 08/13/2013  . Malignant neoplasm of female breast ([place])  09/19/2012  . Lithium use 09/17/2011  . Abnormal coordination 09/17/2011  . Premature menopause   . Depression   . Anxiety   . Bipolar disorder ([place])   . ADD (attention deficit disorder)   . Diabetes mellitus   . NONSPECIFIC ABNORMAL ELECTROCARDIOGRAM 11/04/2009  . ALLERGIC RHINITIS 09/01/2009  . INCONTINENCE 09/01/2009  . DIAB W/O COMP TYPE II/UNS NOT STATED UNCNTRL 07/28/2008  . NONSPEC ELEVATION OF LEVELS OF TRANSAMINASE/LDH 07/28/2008  . MOTOR RESTLESSNESS 02/19/2008  . Internal hemorrhoids without mention of complication [id number]  . ESOPHAGEAL STRICTURE 09/15/2007  . DYSPHAGIA 09/15/2007  . OBSTRUCTIVE SLEEP APNEA 07/21/2007  . HYPERLIPIDEMIA 07/07/2007  . Osteopenia 02/20/2007  . HYPOTHYROIDISM 10/01/2006  . BREAST CANCER, HX OF 08/12/2006    Bayard Beaver, SPTA 04/13/2015, 2:21 PM  [facility] [address] [place], [place], [address] Phone: [phone]   Fax:  [phone]  Name: [name] MRN: [id number] Date of Birth: 09/12/1955  This note has been reviewed and edited by supervising CI.  Willow Ora, PTA, [facility] [address], [place], [place] [address] [phone] 04/13/2015, 4:30 PM

## 2015-04-15 ENCOUNTER — Telehealth: Payer: Self-pay | Admitting: Oncology

## 2015-04-15 ENCOUNTER — Ambulatory Visit: Payer: PPO

## 2015-04-15 ENCOUNTER — Other Ambulatory Visit (HOSPITAL_BASED_OUTPATIENT_CLINIC_OR_DEPARTMENT_OTHER): Payer: PPO

## 2015-04-15 ENCOUNTER — Ambulatory Visit (HOSPITAL_BASED_OUTPATIENT_CLINIC_OR_DEPARTMENT_OTHER): Payer: PPO | Admitting: Oncology

## 2015-04-15 ENCOUNTER — Ambulatory Visit (HOSPITAL_BASED_OUTPATIENT_CLINIC_OR_DEPARTMENT_OTHER): Payer: PPO

## 2015-04-15 ENCOUNTER — Telehealth: Payer: Self-pay | Admitting: *Deleted

## 2015-04-15 VITALS — BP 131/80 | HR 75 | Temp 98.1°F | Resp 18 | Ht 62.0 in | Wt 158.1 lb

## 2015-04-15 VITALS — BP 116/77 | HR 70

## 2015-04-15 DIAGNOSIS — R635 Abnormal weight gain: Secondary | ICD-10-CM

## 2015-04-15 DIAGNOSIS — C569 Malignant neoplasm of unspecified ovary: Secondary | ICD-10-CM | POA: Diagnosis not present

## 2015-04-15 DIAGNOSIS — Z5112 Encounter for antineoplastic immunotherapy: Secondary | ICD-10-CM

## 2015-04-15 DIAGNOSIS — F329 Major depressive disorder, single episode, unspecified: Secondary | ICD-10-CM

## 2015-04-15 DIAGNOSIS — F419 Anxiety disorder, unspecified: Secondary | ICD-10-CM | POA: Diagnosis not present

## 2015-04-15 DIAGNOSIS — D696 Thrombocytopenia, unspecified: Secondary | ICD-10-CM

## 2015-04-15 DIAGNOSIS — Z95828 Presence of other vascular implants and grafts: Secondary | ICD-10-CM

## 2015-04-15 LAB — COMPREHENSIVE METABOLIC PANEL
ALT: 15 U/L (ref 0–55)
ANION GAP: 8 meq/L (ref 3–11)
AST: 26 U/L (ref 5–34)
Albumin: 3.4 g/dL — ABNORMAL LOW (ref 3.5–5.0)
Alkaline Phosphatase: 82 U/L (ref 40–150)
BUN: 19.3 mg/dL (ref 7.0–26.0)
CHLORIDE: 106 meq/L (ref 98–109)
CO2: 27 meq/L (ref 22–29)
Calcium: 9.8 mg/dL (ref 8.4–10.4)
Creatinine: 0.8 mg/dL (ref 0.6–1.1)
EGFR: 78 mL/min/{1.73_m2} — AB (ref >90)
GLUCOSE: 119 mg/dL (ref 70–140)
Potassium: 4.3 mEq/L (ref 3.5–5.1)
SODIUM: 141 meq/L (ref 136–145)
TOTAL PROTEIN: 8.2 g/dL (ref 6.4–8.3)
Total Bilirubin: 0.32 mg/dL (ref 0.20–1.20)

## 2015-04-15 LAB — CBC WITH DIFFERENTIAL/PLATELET
BASO%: 0.3 % (ref 0.0–2.0)
Basophils Absolute: 0 10*3/uL (ref 0.0–0.1)
EOS%: 4.9 % (ref 0.0–7.0)
Eosinophils Absolute: 0.2 10*3/uL (ref 0.0–0.5)
HCT: 39.3 % (ref 34.8–46.6)
HEMOGLOBIN: 12.2 g/dL (ref 11.6–15.9)
LYMPH%: 34.4 % (ref 14.0–49.7)
MCH: 29.5 pg (ref 25.1–34.0)
MCHC: 31 g/dL — AB (ref 31.5–36.0)
MCV: 94.9 fL (ref 79.5–101.0)
MONO#: 0.4 10*3/uL (ref 0.1–0.9)
MONO%: 12 % (ref 0.0–14.0)
NEUT%: 48.4 % (ref 38.4–76.8)
NEUTROS ABS: 1.8 10*3/uL (ref 1.5–6.5)
PLATELETS: 92 10*3/uL — AB (ref 145–400)
RBC: 4.14 10*6/uL (ref 3.70–5.45)
RDW: 14.8 % — AB (ref 11.2–14.5)
WBC: 3.7 10*3/uL — AB (ref 3.9–10.3)
lymph#: 1.3 10*3/uL (ref 0.9–3.3)

## 2015-04-15 MED ORDER — SODIUM CHLORIDE 0.9% FLUSH
10.0000 mL | INTRAVENOUS | Status: DC | PRN
  Administered 2015-04-15: 10 mL via INTRAVENOUS
  Filled 2015-04-15: qty 10

## 2015-04-15 MED ORDER — HEPARIN SOD (PORK) LOCK FLUSH 100 UNIT/ML IV SOLN
500.0000 [IU] | Freq: Once | INTRAVENOUS | Status: AC | PRN
  Administered 2015-04-15: 500 [IU]
  Filled 2015-04-15: qty 5

## 2015-04-15 MED ORDER — SODIUM CHLORIDE 0.9 % IV SOLN
14.5000 mg/kg | Freq: Once | INTRAVENOUS | Status: AC
  Administered 2015-04-15: 1100 mg via INTRAVENOUS
  Filled 2015-04-15: qty 44

## 2015-04-15 MED ORDER — SODIUM CHLORIDE 0.9 % IV SOLN
Freq: Once | INTRAVENOUS | Status: AC
  Administered 2015-04-15: 13:00:00 via INTRAVENOUS

## 2015-04-15 MED ORDER — SODIUM CHLORIDE 0.9 % IJ SOLN
10.0000 mL | INTRAMUSCULAR | Status: DC | PRN
  Administered 2015-04-15: 10 mL
  Filled 2015-04-15: qty 10

## 2015-04-15 NOTE — Patient Instructions (Signed)
[facility] Discharge Instructions for Patients Receiving Chemotherapy  Today you received the following chemotherapy agents Avastin   To help prevent nausea and vomiting after your treatment, we encourage you to take your nausea medication as directed.    If you develop nausea and vomiting that is not controlled by your nausea medication, call the clinic.   BELOW ARE SYMPTOMS THAT SHOULD BE REPORTED IMMEDIATELY:  *FEVER GREATER THAN 100.5 F  *CHILLS WITH OR WITHOUT FEVER  NAUSEA AND VOMITING THAT IS NOT CONTROLLED WITH YOUR NAUSEA MEDICATION  *UNUSUAL SHORTNESS OF BREATH  *UNUSUAL BRUISING OR BLEEDING  TENDERNESS IN MOUTH AND THROAT WITH OR WITHOUT PRESENCE OF ULCERS  *URINARY PROBLEMS  *BOWEL PROBLEMS  UNUSUAL RASH Items with * indicate a potential emergency and should be followed up as soon as possible.  Feel free to call the clinic you have any questions or concerns. The clinic phone number is [phone].  Please show the CHEMO ALERT CARD at check-in to the Emergency Department and triage nurse.   

## 2015-04-15 NOTE — Progress Notes (Signed)
Hematology and Oncology Follow Up Visit  [name] [id number] 1955-04-11 60 y.o. 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 This was diagnosed in July of 2014.   Prior Therapy:  She is status post paracentesis performed on 09/17/2012 with the cytology confirmed the presence of adenocarcinoma and immunohistochemical stains suggest GYN etiology. She is also status post lumpectomy for breast cancer diagnosed 25 years ago followed by radiation and chemotherapy under the care of Dr. Sonny Padilla. She did not receive any hormonal therapy. Chemotherapy utilizing carboplatin and Taxotere started on 10/01/2012. Avastin was added with cycle 4. This was discontinued in June of 2015.  Current therapy:  She is currently on a Avastin maintenance only. Chemotherapy was discontinued in June of 2015.  Interim History: [name] presents today for a followup visit with her husband. Since her last visit, she reports no recent complaints. She has started physical therapy to help with her gait and balance which have helped her so far. She have also noted some decrease in her voice strength and she was recommended to have speech therapy. She has not reported any syncopal episodes or falls.   She continues to tolerate Avastin without any complications. She denied any abdominal pain, early satiety or discomfort. She does report some loose bowel habits have been chronic in nature. She'll use Imodium which helped her symptoms. She has not reported any bleeding issues. She has not reported any epistaxis, hematochezia or melena. She has not reported any infusion-related complications.  She does not report any headaches, blurry vision, syncope or seizures. She denied any fever chills. Has not reported any shortness of breath or cough. She denied any pain with swallowing. She has reported also  grade 1 neuropathy which is not changed.  She has not any shortness of breath or chest pain. She denied any GYN bleeding. She has not reported any increase in her abdominal pain or early satiety. She also reports no hematochezia or melena.  Her mood is stable.[name] of the review of systems is unremarkable.  Medications: I have reviewed the patient's current medications.  Current Outpatient Prescriptions  Medication Sig Dispense Refill  . Acetylcarnitine HCl (ACETYL L-CARNITINE) 500 MG CAPS Take 500 mg by mouth QID.    Marland Kitchen ALPRAZolam (XANAX) 1 MG tablet Take 1 mg by mouth at bedtime as needed for anxiety.    . betamethasone dipropionate (DIPROLENE) 0.05 % cream Apply topically 2 (two) times daily. (Patient taking differently: Apply topically as needed. ) 30 g 0  . calcium carbonate (TUMS - DOSED IN MG ELEMENTAL CALCIUM) 500 MG chewable tablet Chew 1 tablet by mouth as needed for indigestion or heartburn.    . divalproex (DEPAKOTE) 250 MG DR tablet Take 250 mg by mouth 3 (three) times daily. Pt takes Depakote DR 250 mg, 2 tablets in the morning and 3 tablets in the evening    . gabapentin (NEURONTIN) 100 MG capsule TAKE ONE CAPSULE BY MOUTH THREE TIMES A DAY 90 capsule 1  . lamoTRIgine (LAMICTAL) 150 MG tablet Take 150 mg by mouth at bedtime.  1  . levothyroxine (SYNTHROID, LEVOTHROID) 100 MCG tablet Take 100 mcg by mouth daily before breakfast.     . lidocaine-prilocaine (EMLA) cream Apply topically as needed. Apply to port with every chemotherapy. 30 g 1  . loperamide (IMODIUM A-D) 2 MG tablet Take 2 mg by mouth as needed  for diarrhea or loose stools.    . ondansetron (ZOFRAN ODT) 8 MG disintegrating tablet Take 1 tablet (8 mg total) by mouth every 8 (eight) hours as needed for nausea or vomiting. 20 tablet 0  . rOPINIRole (REQUIP) 1 MG tablet Take 1 mg by mouth at bedtime.     No current facility-administered medications for this visit.     Allergies:  Allergies  Allergen Reactions  .  Pollen Extract Other (See Comments)    Pollen and grass causes a lot sneezing    Past Medical History, Surgical history, Social history, and Family History were reviewed and updated.   Physical Exam: Blood pressure 131/80, pulse 75, temperature 98.1 F (36.7 C), temperature source Oral, resp. rate 18, height 5\' 2"  (1.575 m), weight 158 lb 1.6 oz (71.714 kg), SpO2 100 %.  ECOG: 1 General appearance: Alert, awake woman without distress. Head: Normocephalic, without obvious abnormality. No oral thrush noted. Neck: no adenopathy Lymph nodes: Cervical, supraclavicular, and axillary nodes normal. Heart:regular rate and rhythm, S1, S2 normal, no murmur, click, rub or gallop Lung:chest clear, no wheezing, rales, normal symmetric air entry Abdomin: Soft, nontender with good bowel sounds. No shifting dullness or ascites. EXT:no erythema, induration, or nodules. No edema. Skin: No rashes or lesions.  Lab Results: Lab Results  Component Value Date   WBC 3.7* 04/15/2015   HGB 12.2 04/15/2015   HCT 39.3 04/15/2015   MCV 94.9 04/15/2015   PLT 92* 04/15/2015     Chemistry      Component Value Date/Time   NA 141 03/25/2015 1047   NA 143 02/11/2015 1005   K 5.1 03/25/2015 1047   K 4.2 02/11/2015 1005   CL 104 02/11/2015 1005   CO2 27 03/25/2015 1047   CO2 30 02/11/2015 1005   BUN 15.2 03/25/2015 1047   BUN 16 02/11/2015 1005   CREATININE 0.8 03/25/2015 1047   CREATININE 0.69 02/11/2015 1005      Component Value Date/Time   CALCIUM 10.0 03/25/2015 1047   CALCIUM 10.0 02/11/2015 1005   ALKPHOS 66 03/25/2015 1047   ALKPHOS 69 02/11/2015 1005   AST 19 03/25/2015 1047   AST 31 02/11/2015 1005   ALT 13 03/25/2015 1047   ALT 18 02/11/2015 1005   BILITOT 0.39 03/25/2015 1047   BILITOT 0.5 02/11/2015 1005         Impression and Plan:  60 year old woman with the following issues:  1. Peritoneal carcinomatosis with omental involvement. The pathology confirmed the presence of  adenocarcinoma of likely GYN etiology. Her CA 125 was elevated at 333.5 on 09/19/2012. She is status post systemic chemotherapy with an excellent response utilizing carboplatin and Taxotere and a Avastin . This therapy was held in June of 2015 and currently on Avastin maintenance.   CT scan obtained on 12/29/2014 showed stable disease. The plan is to continue with maintenance a Avastin for now and a repeat imaging studies in March 2017. Her tumor marker have shows some slight increase and difference of his therapy will be used if she has clear progression.  2. IV access: Port-A-Cath is in place without any complaints or malfunction.  3. Thrombocytopenia: Her platelets count continues to improve without any bleeding episodes.  4. Weight gain: we have been relatively stable with any major changes.  5. Anxiety and depression: Her mood is stable without any recent changes.  6. Presyncopal episode with possible positional vertigo: She was evaluated by neurology and currently receiving physical therapy for gait and balance  training.  7. The followup: She will followup in 3 weeks for her next cycle of a Avastin and repeat imaging studies.    Zola Button, MD 2/24/201712:54 PM

## 2015-04-15 NOTE — Telephone Encounter (Signed)
Per staff message and POF I have scheduled appts. Advised scheduler of appts. JMW  

## 2015-04-15 NOTE — Telephone Encounter (Signed)
per pof to ch pt appt-sent MW email to sch trmt and pt to get updated copy of avs b4 leaving-referral in [facility]) will call in their [facility]

## 2015-04-16 LAB — CA 125: CANCER ANTIGEN (CA) 125: 85.8 U/mL — AB (ref 0.0–38.1)

## 2015-04-20 ENCOUNTER — Ambulatory Visit: Payer: PPO | Attending: Neurology | Admitting: Physical Therapy

## 2015-04-20 DIAGNOSIS — R499 Unspecified voice and resonance disorder: Secondary | ICD-10-CM | POA: Insufficient documentation

## 2015-04-20 DIAGNOSIS — M436 Torticollis: Secondary | ICD-10-CM | POA: Diagnosis not present

## 2015-04-20 DIAGNOSIS — R269 Unspecified abnormalities of gait and mobility: Secondary | ICD-10-CM | POA: Diagnosis not present

## 2015-04-20 DIAGNOSIS — R2681 Unsteadiness on feet: Secondary | ICD-10-CM | POA: Insufficient documentation

## 2015-04-20 DIAGNOSIS — R42 Dizziness and giddiness: Secondary | ICD-10-CM | POA: Diagnosis not present

## 2015-04-20 NOTE — Therapy (Signed)
[facility] [address] [place], [place], [address] Phone: [phone]   Fax:  [phone]  Physical Therapy Treatment  Patient Details  Name: [name] MRN: [id number] Date of Birth: 13-Dec-1955 Referring Provider: Sarina Ill, MD  Encounter Date: 04/20/2015      PT End of Session - 04/20/15 1737    Visit Number 8   Number of Visits 13   Date for PT Re-Evaluation 05/20/15   Authorization Type Medicare - G Codes every 10 visits   PT Start Time 8416   PT Stop Time 6063   PT Time Calculation (min) 43 min      Past Medical History  Diagnosis Date  . Premature menopause   . Osteopenia 03/2009    t score -2.1 FRAX 4.6/0.4  . Hypothyroidism   . Depression   . Anxiety   . Hyperlipidemia   . Bipolar disorder ([place])   . ADD (attention deficit disorder)   . Hematochezia   . Allergy     seasonal  . Maintenance chemotherapy     Pt has chemo every 3 weeks (on Friday)  . Sleep apnea     mild  . Breast cancer ([place]) 1989    right breast  . Diabetes mellitus     pt denies DM noe meds    Past Surgical History  Procedure Laterality Date  . Breast surgery  1989    RIGHT LUMPECTOMY, RADIATION AND CHEMO  . Hysteroscopy  2011    Polyp  . Pelvic laparoscopy/ hysteroscopy  1996  . Foot surgery  2013    BILATERAL     There were no vitals filed for this visit.  Visit Diagnosis:  Stiffness of neck - Plan: PT plan of care cert/re-cert  Dizziness and giddiness - Plan: PT plan of care cert/re-cert  Unsteadiness - Plan: PT plan of care cert/re-cert  Abnormality of gait - Plan: PT plan of care cert/re-cert      Subjective Assessment - 04/20/15 1404    Subjective Pt reports no significant changes since last session. Continues to report stiffness in the neck. Reports "dizziness is a whole lot better. I'm standing on the risers in chior now."   Pertinent History PMH significant: omentum cancer (active; chemo  every 3 weeks); h/o breast cancer s/p R lumpectomy (1989); bipolar disorder, anxiety, depression, OCD, ADD   Patient Stated Goals "To not be dizzy and to be balanced."   Currently in Pain? Yes   Pain Score 4    Pain Location Neck   Pain Orientation Right;Distal;Posterior   Pain Descriptors / Indicators Other (Comment)  stiffness   Pain Type Chronic pain   Pain Onset More than a month ago   Pain Frequency Constant   Aggravating Factors  turning head to R side   Pain Relieving Factors rest   Multiple Pain Sites No            [facility] PT Assessment - 04/20/15 0001    AROM   Cervical Flexion 26  painful on R aspect of lower c spine   Cervical Extension 19  painful on L aspect of lower c spine   Cervical - Right Side Bend 11   Cervical - Left Side Bend 10   Cervical - Right Rotation 26  painful in lower cervical spine   Cervical - Left Rotation 33  pain in lower L c spine   Standardized Balance Assessment   Standardized Balance Assessment Dynamic Gait Index  Dynamic Gait Index   Level Surface Mild Impairment   Change in Gait Speed Normal   Gait with Horizontal Head Turns Moderate Impairment   Gait with Vertical Head Turns Mild Impairment   Gait and Pivot Turn Mild Impairment   Step Over Obstacle Normal   Step Around Obstacles Mild Impairment   Steps Moderate Impairment   Total Score 16                     [facility] Adult PT Treatment/Exercise - 04/20/15 0001    Bed Mobility   Bed Mobility Supine to Sit;Sit to Supine   Supine to Sit 4: Min assist   Supine to Sit Details (indicate cue type and reason) Requires assist due to pt avoiding cervical spine rotation with supine > R sidelying to initiate supine > sit   Sit to Supine 6: Modified independent (Device/Increase time)   Transfers   Transfers Sit to Stand;Stand to Sit   Sit to Stand 7: Independent   Stand to Sit 7: Independent   Ambulation/Gait   Ambulation/Gait Yes   Ambulation/Gait Assistance 6: Modified  independent (Device/Increase time)   Ambulation/Gait Assistance Details (S) required for aspects of dynamic gait; see DGI for details   Ambulation Distance (Feet) 300 Feet   Assistive device None   Gait Pattern Step-through pattern;Decreased stride length  avoids head turns during gait; turns en bloc   Stairs Yes   Stairs Assistance 6: Modified independent (Device/Increase time)   Stair Management Technique Two rails;Alternating pattern;Step to pattern;Forwards  reciprocal to ascend; step-to to descend   Number of Stairs 4   Height of Stairs 6   Posture/Postural Control   Posture/Postural Control Postural limitations   Postural Limitations Rounded Shoulders;Forward head;Increased thoracic kyphosis;Posterior pelvic tilt;Flexed trunk   Exercises   Exercises Other Exercises   Other Exercises  With verbal/demo cueing from PT, pt performed middle scalene stretch 3 x30-sec holds bilaterally.   Manual Therapy   Manual Therapy Joint mobilization   Manual therapy comments Supine:Contract-relax PNF alternating with prolonged passive stretch  (2 x30-sec holds per side) of B cervical spine rotation, lateral flexion, cervical retraction with concurrent lateral flexion.Seated with RUE supported in 90* ABD, cervical SB to R side, performed gentle mobilization of R first rib 6 x10-sec holds to increase extensibility of R anterior/middle scalenes.                PT Education - 04/20/15 1726    Education provided Yes   Education Details HEP: added middle scalene stretch.  Educated pt on STG's, progress, DGI findings and functional implications.   Person(s) Educated Patient   Methods Explanation;Demonstration;Verbal cues   Comprehension Verbalized understanding;Returned demonstration          PT Short Term Goals - 04/20/15 1417    PT SHORT TERM GOAL #1   Title Pt will perform initial HEP with mod I using paper handout to maximize functional gains made in PT.  (Target date: 04/18/15)    Status On-going   PT SHORT TERM GOAL #2   Title Rule out orthostatic hypotension as contributing factor to dizziness.  (Target date: 04/18/15)   Baseline Orthostatic hypotension ruled out 2/6.   Status Achieved   PT SHORT TERM GOAL #3   Title Pt will improve active cervical spine lateral flexion to 15 degrees bilaterally to progress toward normal neck ROM.  (Target date: 04/18/15)   Baseline baseline:  9 * to R;   6* to L.  3/1:  11* to R;  10* to L.   Status Not Met   PT SHORT TERM GOAL #4   Title Pt will improve active cervical spine rotation to 40 degrees bilaterally progress toward normal neck ROM.   (Target date: 04/18/15)   Baseline baseline: 26* to R;  19* to L.   3/1: 26* to R; 33* to L   Status Not Met   PT SHORT TERM GOAL #5   Title Pt will improve DGI score from 13/24 to 16/24 to indicate improved dynamic gait stability.  (Target date: 04/18/15)   Baseline 3/1: DGI = 16/24   Status Achieved   PT SHORT TERM GOAL #6   Title Pt will improve gait speed from 2.42 ft/sec to > 2.62 ft/sec to indicate functional status of community ambulator.  (Target date: 04/18/15)   Baseline 3/1: gait velocity = 3.29 ft/sec   Status Achieved           PT Long Term Goals - 04/20/15 1734    PT LONG TERM GOAL #1   Title Pt will improve active cervical spine lateral flexion to 15 degrees bilaterally to progress toward normal neck ROM. (Target date: 05/16/15)   Baseline 3/1:  REVISED from 30* to 15* due to lack of progress   Status Revised   PT LONG TERM GOAL #2   Title Pt will improve active cervical spine rotation to 35 degrees bilaterally progress toward normal neck ROM.   (Target date: 05/16/15)   Baseline 3/1:  REVISED from 55* to 35* due to lack of progress   Status Revised   PT LONG TERM GOAL #3   Title Pt will improve DGI score from 13/24 to > / = 20/24 to indicate decreased fall risk.    (Target date: 05/16/15)   Status On-going   PT LONG TERM GOAL #4   Title Pt will decrease DHI score from  72 to < / = 54 to indicate significant decrease in pt-perceived disability due to dizziness.   (Target date: 05/16/15)   Status On-going   PT LONG TERM GOAL #5   Title Pt will independently negotiate 2 stairs without rails to indicate pt abiliy to safely use primary home entrance.    (Target date: 05/16/15)   Status On-going   Additional Long Term Goals   Additional Long Term Goals Yes   PT LONG TERM GOAL #6   Title Pt will independently ambulate > 500' over unlevel, paved surfaces and negotiate standard ramp and curb step to indicate safety with community mobility.    (Target date: 05/16/15)   Status On-going   PT LONG TERM GOAL #7   Title Pt will consistently perform supine > sit independently to indicate increased independence with bed mbility.   (Target date: 05/16/15)   Status New               Plan - 04/20/15 1746    Clinical Impression Statement Pt met 3 of 5 STG's assessed during this session (did not assess HEP performance due to time constraint). Pt reports significant improvement in perceived dizziness and DGI improved to goal-level. Noted minimal improvement in cervical spine AROM, with exception of L rotation, which has improved from 19 to 33 degrees since PT evaluation. Revised LTG's for ROM due to lack of progress. Also added LTG for bed mobility due to pt continuing to require min A for supine > sit. Educated pt on goals, progress, and POC with verbal understanding from pt.  Pt will benefit from skilled therapeutic intervention in order to improve on the following deficits Pain;Postural dysfunction;Decreased balance;Abnormal gait;Decreased knowledge of use of DME;Decreased range of motion;Decreased mobility;Hypomobility;Impaired sensation;Impaired flexibility;Decreased activity tolerance;Increased fascial restricitons;Decreased strength   Rehab Potential Good   Clinical Impairments Affecting Rehab Potential high insurance copay; currently undergoing chemotherapy   PT  Frequency Other (comment)  2x/week for 4 weeks then 1x/week for 4 weeks   PT Duration --  see above   PT Treatment/Interventions ADLs/Self Care Home Management;Vestibular;Manual techniques;Patient/family education;Neuromuscular re-education;Gait training;Stair training;Functional mobility training;Therapeutic activities;Therapeutic exercise;Balance training;DME Instruction   PT Next Visit Plan Assess remaining STG for HEP.  Address supine > sit using logroll technique. Consider gaze stabilization, as pt is now able to perform almost 30 degrees of cervical rotation.   Consulted and Agree with Plan of Care Patient        Problem List Patient Active Problem List   Diagnosis Date Noted  . Dizziness 03/29/2015  . Ovarian cancer ([place]) 08/13/2013  . Malignant neoplasm of female breast ([place]) 09/19/2012  . Lithium use 09/17/2011  . Abnormal coordination 09/17/2011  . Premature menopause   . Depression   . Anxiety   . Bipolar disorder ([employer])   . ADD (attention deficit disorder)   . Diabetes mellitus   . NONSPECIFIC ABNORMAL ELECTROCARDIOGRAM 11/04/2009  . ALLERGIC RHINITIS 09/01/2009  . INCONTINENCE 09/01/2009  . DIAB W/O COMP TYPE II/UNS NOT STATED UNCNTRL 07/28/2008  . NONSPEC ELEVATION OF LEVELS OF TRANSAMINASE/LDH 07/28/2008  . MOTOR RESTLESSNESS 02/19/2008  . Internal hemorrhoids without mention of complication 44/92/0100  . ESOPHAGEAL STRICTURE 09/15/2007  . DYSPHAGIA 09/15/2007  . OBSTRUCTIVE SLEEP APNEA 07/21/2007  . HYPERLIPIDEMIA 07/07/2007  . Osteopenia 02/20/2007  . HYPOTHYROIDISM 10/01/2006  . BREAST CANCER, HX OF 08/12/2006   Billie Ruddy, PT, DPT [facility] [address] [place], [place], [address] Phone: [phone]   Fax:  [phone] 04/20/2015, 5:55 PM   Name: DAMALI BROADFOOT MRN: [id number] Date of Birth: 1955-10-03

## 2015-04-26 ENCOUNTER — Other Ambulatory Visit: Payer: Self-pay | Admitting: *Deleted

## 2015-04-26 DIAGNOSIS — R4789 Other speech disturbances: Secondary | ICD-10-CM | POA: Insufficient documentation

## 2015-04-26 DIAGNOSIS — C50919 Malignant neoplasm of unspecified site of unspecified female breast: Secondary | ICD-10-CM

## 2015-04-26 DIAGNOSIS — R42 Dizziness and giddiness: Secondary | ICD-10-CM

## 2015-04-26 DIAGNOSIS — R278 Other lack of coordination: Secondary | ICD-10-CM

## 2015-04-26 DIAGNOSIS — Z853 Personal history of malignant neoplasm of breast: Secondary | ICD-10-CM

## 2015-04-26 DIAGNOSIS — C569 Malignant neoplasm of unspecified ovary: Secondary | ICD-10-CM

## 2015-04-26 DIAGNOSIS — R499 Unspecified voice and resonance disorder: Secondary | ICD-10-CM | POA: Insufficient documentation

## 2015-04-26 DIAGNOSIS — R1319 Other dysphagia: Secondary | ICD-10-CM

## 2015-04-26 DIAGNOSIS — K222 Esophageal obstruction: Secondary | ICD-10-CM

## 2015-04-26 DIAGNOSIS — R498 Other voice and resonance disorders: Secondary | ICD-10-CM | POA: Insufficient documentation

## 2015-04-26 DIAGNOSIS — R490 Dysphonia: Secondary | ICD-10-CM | POA: Insufficient documentation

## 2015-04-27 ENCOUNTER — Ambulatory Visit: Payer: PPO

## 2015-04-27 ENCOUNTER — Ambulatory Visit: Payer: PPO | Admitting: Physical Therapy

## 2015-04-27 DIAGNOSIS — M436 Torticollis: Secondary | ICD-10-CM | POA: Diagnosis not present

## 2015-04-27 DIAGNOSIS — R499 Unspecified voice and resonance disorder: Secondary | ICD-10-CM

## 2015-04-27 NOTE — Patient Instructions (Signed)
Flexibility: Upper Trapezius Stretch    Gently grasp right side of head while reaching behind back with other hand. Tilt head away until a gentle stretch is felt. Hold _ 30__ seconds. Repeat __4__ times per set. Do this __2__ times per day on each side.   Chin tuck exercise with towel    Lie on your back with a rolled up towel at the base of your head. Tuck your chin and push the back of your head into the towel. You should feel a deep stretch in the neck. Hold for 10 seconds. Relax. Repeat. Perform 10 reps per day.   Thoracic spine extension over chair    Find a chair so that the back hits mid shoulder blade Place feet up on stool so legs are above horizontal Clasp hands behind neck Lean backwards to feel arch in mid spine (this should not hurt).  Hold stretch for 5 seconds. Relax. Repeat. Do this stretch 10 times per day.

## 2015-04-27 NOTE — Therapy (Signed)
[facility] [address] [place], [place], [address] Phone: [phone]   Fax:  [phone]  Speech Language Pathology Evaluation  Patient Details  Name: [name] MRN: [id number] Date of Birth: [birth year] Referring Provider: Zola Button, M.D.  Encounter Date: 04/27/2015      End of Session - 04/27/15 1130    Visit Number 1   Number of Visits 17   Date for SLP Re-Evaluation 07/11/15   SLP Start Time 1018   SLP Stop Time  1101   SLP Time Calculation (min) 43 min   Activity Tolerance Patient tolerated treatment well      Past Medical History  Diagnosis Date  . Premature menopause   . Osteopenia 03/2009    t score -2.1 FRAX 4.6/0.4  . Hypothyroidism   . Depression   . Anxiety   . Hyperlipidemia   . Bipolar disorder ([place])   . ADD (attention deficit disorder)   . Hematochezia   . Allergy     seasonal  . Maintenance chemotherapy     Pt has chemo every 3 weeks (on Friday)  . Sleep apnea     mild  . Breast cancer ([place]) 1989    right breast  . Diabetes mellitus     pt denies DM noe meds    Past Surgical History  Procedure Laterality Date  . Breast surgery  1989    RIGHT LUMPECTOMY, RADIATION AND CHEMO  . Hysteroscopy  2011    Polyp  . Pelvic laparoscopy/ hysteroscopy  1996  . Foot surgery  2013    BILATERAL     There were no vitals filed for this visit.  Visit Diagnosis: Voice disorder      Subjective Assessment - 04/27/15 1037    Subjective Pt has noticed weakness in voice since she began chemo in 2014. "It feels like a part of me is gone."            SLP Evaluation [facility] - 04/27/15 1032    SLP Visit Information   SLP Received On 04/27/15   Referring Provider Zola Button, M.D.   Onset Date 2014   Medical Diagnosis Weakness of voice   General Information   HPI Pt reports being unable to project like she could prior to initiation of chemo. 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 No difficulties with voice during her employment. Pt can no longer sing or project like she could.   Prior Functional Status   Cognitive/Linguistic Baseline Within functional limits   Cognition   Overall Cognitive Status Within Functional Limits for tasks assessed   Auditory Comprehension   Overall Auditory Comprehension Appears within functional limits for tasks assessed   Verbal Expression   Overall Verbal Expression Appears within functional limits for tasks assessed   Oral Motor/Sensory Function   Overall Oral Motor/Sensory Function Appears within functional limits for tasks assessed   Motor Speech   Overall Motor Speech Appears within functional limits for tasks assessed   Respiration Impaired  chest/clavicular breathing in conversation   Level of Impairment Phrase   Phonation Low vocal intensity   Phonation --       Five minutes of conversational speech was reduced today, at average 66dB (WNL= average 70-72dB) with range of 62-70dB, when a sound level meter was placed 30 cm away from pt's mouth. Overall intelligibility for this listener in a quiet environment was >95%. Production of sustained /  a/ averaged 8.5 seconds, below what would be expected for pt. When she produced loud and long /a/, an average of 90dB (range of 84 to 92) was generated. Min-mod cues usually were needed for loudness.  In conversation of 2 minutes afterwards pt was asked to use the same amount of effort as with loud /a/. Loudness average with this increased effort was widely variable but averaged at 69dB (range of 65 to 73) with occasional min A for loudness.  Pt maintained chest breathing throughout the evaluation when speaking. She would benefit from training for abdominal breathing. Pt would benefit from skilled ST in order to improve speech intelligibility and pt's QOL.                    SLP Education - 04-29-15 1127    Education provided Yes    Education Details need for ENT eval prior to initiation of therapy, therapy course   Person(s) Educated Patient   Methods Explanation   Comprehension Verbalized understanding          SLP Short Term Goals - 2015-04-29 1708    SLP SHORT TERM GOAL #1   Title pt will maintain average loud /a/ at 91dB over 4 sessions   Time 4   Period Weeks   Status New   SLP SHORT TERM GOAL #2   Title pt will respond with 19/20 sentences at average 70dB x two sessions   Time 4   Period Weeks   Status New   SLP SHORT TERM GOAL #3   Title pt will demo average 70dB in 7 minutes simple conversation over 3 sessions   Time 4   Period Weeks   Status New   SLP SHORT TERM GOAL #4   Title pt will use abdominal breathing in 19/20 sentence responses over 3 sessions   Time 4   Period Weeks   Status New          SLP Long Term Goals - 29-Apr-2015 1709    SLP LONG TERM GOAL #1   Title pt will report people ask her to repeat herself (subjectively) 50% less than prior to ST   Time 8   Period Weeks   Status New   SLP LONG TERM GOAL #2   Title pt will demo mod complex conversation with average 70dB over 10 minutes   Time 8   Period Weeks   Status New   SLP LONG TERM GOAL #3   Title pt will use abdominal breathing in 10 minutes mod complex conversation 80% of the time   Time 8   Period Weeks   Status New          Plan - 29-Apr-2015 1130    Clinical Impression Statement Pt presents today with conversation volume lower than WNL, likely due to deconditioning from chemotherapy. She would benefit from skilled ST to incr conversational volume.   Speech Therapy Frequency 2x / week   Duration --  8 weeks (6 likely?)   Treatment/Interventions Compensatory techniques;Functional tasks;Patient/family education;SLP instruction and feedback  HEP for vocal strengthening   Potential to Achieve Goals Good   Potential Considerations Co-morbidities   Consulted and Agree with Plan of Care Patient          G-Codes  - 04-29-15 1712    Functional Assessment Tool Used noms 5 (35-40% ipmaired)   Functional Limitations Voice   Voice Current Status (G9171) At least 20 percent but less than 40 percent impaired, limited or restricted  Voice Goal Status [phone]) At least 1 percent but less than 20 percent impaired, limited or restricted      Problem List Patient Active Problem List   Diagnosis Date Noted  . Problem with voice production 04/26/2015    Class: Acute  . Weakness of voice 04/26/2015    Class: Acute  . Pathologic change of voice 04/26/2015    Class: Acute  . Dizziness 03/29/2015  . Ovarian cancer ([place]) 08/13/2013  . Malignant neoplasm of female breast ([place]) 09/19/2012  . Lithium use 09/17/2011  . Abnormal coordination 09/17/2011  . Premature menopause   . Depression   . Anxiety   . Bipolar disorder ([place])   . ADD (attention deficit disorder)   . Diabetes mellitus   . NONSPECIFIC ABNORMAL ELECTROCARDIOGRAM 11/04/2009  . ALLERGIC RHINITIS 09/01/2009  . INCONTINENCE 09/01/2009  . DIAB W/O COMP TYPE II/UNS NOT STATED UNCNTRL 07/28/2008  . NONSPEC ELEVATION OF LEVELS OF TRANSAMINASE/LDH 07/28/2008  . MOTOR RESTLESSNESS 02/19/2008  . Internal hemorrhoids without mention of complication [id number]  . ESOPHAGEAL STRICTURE 09/15/2007  . DYSPHAGIA 09/15/2007  . OBSTRUCTIVE SLEEP APNEA 07/21/2007  . HYPERLIPIDEMIA 07/07/2007  . Osteopenia 02/20/2007  . HYPOTHYROIDISM 10/01/2006  . BREAST CANCER, HX OF 08/12/2006    [facility] ,MS, CCC-SLP  04/27/2015, 5:14 PM  [facility] [address] [place], [place], [address] Phone: [phone]   Fax:  [phone]  Name: [name] MRN: [id number] Date of Birth: 12-28-1955

## 2015-04-27 NOTE — Therapy (Signed)
[facility] [address] [place], [place], [address] Phone: [phone]   Fax:  [phone]  Physical Therapy Treatment  Patient Details  Name: [name] MRN: [id number] Date of Birth: 1955/05/23 Referring Provider: Sarina Ill, MD  Encounter Date: 04/27/2015      PT End of Session - 04/27/15 1243    Visit Number 9   Number of Visits 13   Date for PT Re-Evaluation 05/20/15   Authorization Type Medicare - G Codes every 10 visits   PT Start Time 2353   PT Stop Time 1232   PT Time Calculation (min) 44 min   Activity Tolerance Patient tolerated treatment well;No increased pain   Behavior During Therapy [facility] for tasks assessed/performed      Past Medical History  Diagnosis Date  . Premature menopause   . Osteopenia 03/2009    t score -2.1 FRAX 4.6/0.4  . Hypothyroidism   . Depression   . Anxiety   . Hyperlipidemia   . Bipolar disorder ([place])   . ADD (attention deficit disorder)   . Hematochezia   . Allergy     seasonal  . Maintenance chemotherapy     Pt has chemo every 3 weeks (on Friday)  . Sleep apnea     mild  . Breast cancer ([place]) 1989    right breast  . Diabetes mellitus     pt denies DM noe meds    Past Surgical History  Procedure Laterality Date  . Breast surgery  1989    RIGHT LUMPECTOMY, RADIATION AND CHEMO  . Hysteroscopy  2011    Polyp  . Pelvic laparoscopy/ hysteroscopy  1996  . Foot surgery  2013    BILATERAL     There were no vitals filed for this visit.  Visit Diagnosis:  Stiffness of neck      Subjective Assessment - 04/27/15 1152    Subjective Pt reports having felt "achy all over, like I had the flu," and reports having had numbness in R arm after last session.    Pertinent History PMH significant: omentum cancer (active; chemo every 3 weeks); h/o breast cancer s/p R lumpectomy (1989); bipolar disorder, anxiety, depression, OCD, ADD   Patient Stated Goals "To not be  dizzy and to be balanced."   Currently in Pain? Yes   Pain Score 2    Pain Location Neck   Pain Orientation Right;Left;Posterior;Proximal   Pain Descriptors / Indicators Dull   Pain Type Chronic pain   Pain Onset More than a month ago   Pain Frequency Constant   Aggravating Factors  turning head (both sides)   Pain Relieving Factors rest   Multiple Pain Sites No            [facility] PT Assessment - 04/27/15 1224    AROM   Cervical - Right Rotation 39   Cervical - Left Rotation 33                     [facility] Adult PT Treatment/Exercise - 04/27/15 0001    Exercises   Exercises Other Exercises   Other Exercises  Seated B upper trapezius self-stretch 2 x30-sec holds per side with min cueing for technique. Seated in standard chair, pt performed thoracic spine extension against chair back 7 x10-sec holds to increased thoracic spine mobility. Supine B shoulder extension AAROM using dowel 10 x10-sec holds. Performed supine, B pectoralis major self-stretch 5 x10-sec holds. Supine chin tucks 6 x10-sec holds.  Manual Therapy   Manual Therapy Joint mobilization;Myofascial release;Muscle Energy Technique   Manual therapy comments Supine: suboccipital release x3 minutes.   Joint Mobilization Supine: grades I-II P/A of L TP at level of C4 for pain control. Transitioned to mobilization with concurrent P/A mobilization of R TP at level of C4 grades I-III   Muscle Energy Technique Supine, cervical spine rotation 5 x5-sec holds in bilat directions.                  PT Short Term Goals - 04/27/15 1249    PT SHORT TERM GOAL #1   Title Pt will perform initial HEP with mod I using paper handout to maximize functional gains made in PT.  (Target date: 04/18/15)   Baseline 3/8: Required use of paper handout and min cueing for technique with HEP.   Status Partially Met   PT SHORT TERM GOAL #2   Title Rule out orthostatic hypotension as contributing factor to dizziness.  (Target date:  04/18/15)   Baseline Orthostatic hypotension ruled out 2/6.   Status Achieved   PT SHORT TERM GOAL #3   Title Pt will improve active cervical spine lateral flexion to 15 degrees bilaterally to progress toward normal neck ROM.  (Target date: 04/18/15)   Baseline baseline:  9 * to R;   6* to L.   3/1:  11* to R;  10* to L.   Status Not Met   PT SHORT TERM GOAL #4   Title Pt will improve active cervical spine rotation to 40 degrees bilaterally progress toward normal neck ROM.   (Target date: 04/18/15)   Baseline baseline: 26* to R;  19* to L.   3/1: 26* to R; 33* to L   Status Deferred  See LTG's.   PT SHORT TERM GOAL #5   Title Pt will improve DGI score from 13/24 to 16/24 to indicate improved dynamic gait stability.  (Target date: 04/18/15)   Baseline 3/1: DGI = 16/24   Status Achieved   PT SHORT TERM GOAL #6   Title Pt will improve gait speed from 2.42 ft/sec to > 2.62 ft/sec to indicate functional status of community ambulator.  (Target date: 04/18/15)   Baseline 3/1: gait velocity = 3.29 ft/sec   Status Achieved           PT Long Term Goals - 04/20/15 1734    PT LONG TERM GOAL #1   Title Pt will improve active cervical spine lateral flexion to 15 degrees bilaterally to progress toward normal neck ROM. (Target date: 05/16/15)   Baseline 3/1:  REVISED from 30* to 15* due to lack of progress   Status Revised   PT LONG TERM GOAL #2   Title Pt will improve active cervical spine rotation to 35 degrees bilaterally progress toward normal neck ROM.   (Target date: 05/16/15)   Baseline 3/1:  REVISED from 55* to 35* due to lack of progress   Status Revised   PT LONG TERM GOAL #3   Title Pt will improve DGI score from 13/24 to > / = 20/24 to indicate decreased fall risk.    (Target date: 05/16/15)   Status On-going   PT LONG TERM GOAL #4   Title Pt will decrease DHI score from 72 to < / = 54 to indicate significant decrease in pt-perceived disability due to dizziness.   (Target date: 05/16/15)    Status On-going   PT LONG TERM GOAL #5   Title Pt will independently  negotiate 2 stairs without rails to indicate pt abiliy to safely use primary home entrance.    (Target date: 05/16/15)   Status On-going   Additional Long Term Goals   Additional Long Term Goals Yes   PT LONG TERM GOAL #6   Title Pt will independently ambulate > 500' over unlevel, paved surfaces and negotiate standard ramp and curb step to indicate safety with community mobility.    (Target date: 05/16/15)   Status On-going   PT LONG TERM GOAL #7   Title Pt will consistently perform supine > sit independently to indicate increased independence with bed mbility.   (Target date: 05/16/15)   Status New               Plan - 04/27/15 1250    Clinical Impression Statement Session focused on increasing cervical spine ROM via manual therapy and therapeutic exercise emphasizing more normalized posture. Noted cervical spine rotation AROM improved today as compared with previous session; however, pt did report discomfort after last session. Pt seems to be having difficulty recalling home exercises; HEP modified to increase pt carryover, compliance.   Pt will benefit from skilled therapeutic intervention in order to improve on the following deficits Pain;Postural dysfunction;Decreased balance;Abnormal gait;Decreased knowledge of use of DME;Decreased range of motion;Decreased mobility;Hypomobility;Impaired sensation;Impaired flexibility;Decreased activity tolerance;Increased fascial restricitons;Decreased strength   Rehab Potential Good   Clinical Impairments Affecting Rehab Potential high insurance copay; currently undergoing chemotherapy   PT Frequency Other (comment)  2x/week for 4 weeks then 1x/week for 4 weeks   PT Duration --  see above   PT Treatment/Interventions ADLs/Self Care Home Management;Vestibular;Manual techniques;Patient/family education;Neuromuscular re-education;Gait training;Stair training;Functional mobility  training;Therapeutic activities;Therapeutic exercise;Balance training;DME Instruction   PT Next Visit Plan DGI and GCODES.   Consulted and Agree with Plan of Care Patient        Problem List Patient Active Problem List   Diagnosis Date Noted  . Problem with voice production 04/26/2015    Class: Acute  . Weakness of voice 04/26/2015    Class: Acute  . Pathologic change of voice 04/26/2015    Class: Acute  . Dizziness 03/29/2015  . Ovarian cancer ([place]) 08/13/2013  . Malignant neoplasm of female breast ([place]) 09/19/2012  . Lithium use 09/17/2011  . Abnormal coordination 09/17/2011  . Premature menopause   . Depression   . Anxiety   . Bipolar disorder ([place])   . ADD (attention deficit disorder)   . Diabetes mellitus   . NONSPECIFIC ABNORMAL ELECTROCARDIOGRAM 11/04/2009  . ALLERGIC RHINITIS 09/01/2009  . INCONTINENCE 09/01/2009  . DIAB W/O COMP TYPE II/UNS NOT STATED UNCNTRL 07/28/2008  . NONSPEC ELEVATION OF LEVELS OF TRANSAMINASE/LDH 07/28/2008  . MOTOR RESTLESSNESS 02/19/2008  . Internal hemorrhoids without mention of complication 38/17/7116  . ESOPHAGEAL STRICTURE 09/15/2007  . DYSPHAGIA 09/15/2007  . OBSTRUCTIVE SLEEP APNEA 07/21/2007  . HYPERLIPIDEMIA 07/07/2007  . Osteopenia 02/20/2007  . HYPOTHYROIDISM 10/01/2006  . BREAST CANCER, HX OF 08/12/2006    Billie Ruddy, PT, DPT [facility] [address] [place], [place], [address] Phone: [phone]   Fax:  [phone] 04/27/2015, 12:58 PM  Name: [name] MRN: [id number] Date of Birth: 1955-04-26

## 2015-04-27 NOTE — Patient Instructions (Signed)
ENTs -Dr. Melony Overly [facility] [place], [facility], [facility]) [place].

## 2015-05-04 ENCOUNTER — Ambulatory Visit: Payer: PPO

## 2015-05-04 ENCOUNTER — Ambulatory Visit: Payer: PPO | Admitting: Physical Therapy

## 2015-05-04 ENCOUNTER — Other Ambulatory Visit (HOSPITAL_BASED_OUTPATIENT_CLINIC_OR_DEPARTMENT_OTHER): Payer: PPO

## 2015-05-04 DIAGNOSIS — C569 Malignant neoplasm of unspecified ovary: Secondary | ICD-10-CM

## 2015-05-04 LAB — COMPREHENSIVE METABOLIC PANEL
ALBUMIN: 3.6 g/dL (ref 3.5–5.0)
ALK PHOS: 83 U/L (ref 40–150)
ALT: 13 U/L (ref 0–55)
AST: 21 U/L (ref 5–34)
Anion Gap: 6 mEq/L (ref 3–11)
BUN: 19.7 mg/dL (ref 7.0–26.0)
CALCIUM: 10.7 mg/dL — AB (ref 8.4–10.4)
CHLORIDE: 105 meq/L (ref 98–109)
CO2: 30 mEq/L — ABNORMAL HIGH (ref 22–29)
Creatinine: 1 mg/dL (ref 0.6–1.1)
EGFR: 60 mL/min/{1.73_m2} — AB (ref >90)
Glucose: 87 mg/dl (ref 70–140)
POTASSIUM: 5.2 meq/L — AB (ref 3.5–5.1)
Sodium: 141 mEq/L (ref 136–145)
Total Bilirubin: 0.36 mg/dL (ref 0.20–1.20)
Total Protein: 8.6 g/dL — ABNORMAL HIGH (ref 6.4–8.3)

## 2015-05-04 LAB — CBC WITH DIFFERENTIAL/PLATELET
BASO%: 0.4 % (ref 0.0–2.0)
BASOS ABS: 0 10*3/uL (ref 0.0–0.1)
EOS%: 3.2 % (ref 0.0–7.0)
Eosinophils Absolute: 0.1 10*3/uL (ref 0.0–0.5)
HEMATOCRIT: 41.3 % (ref 34.8–46.6)
HEMOGLOBIN: 12.9 g/dL (ref 11.6–15.9)
LYMPH#: 1.3 10*3/uL (ref 0.9–3.3)
LYMPH%: 32 % (ref 14.0–49.7)
MCH: 28.5 pg (ref 25.1–34.0)
MCHC: 31.2 g/dL — AB (ref 31.5–36.0)
MCV: 91.1 fL (ref 79.5–101.0)
MONO#: 0.5 10*3/uL (ref 0.1–0.9)
MONO%: 12.1 % (ref 0.0–14.0)
NEUT#: 2.1 10*3/uL (ref 1.5–6.5)
NEUT%: 52.3 % (ref 38.4–76.8)
Platelets: 111 10*3/uL — ABNORMAL LOW (ref 145–400)
RBC: 4.53 10*6/uL (ref 3.70–5.45)
RDW: 15.4 % — AB (ref 11.2–14.5)
WBC: 4 10*3/uL (ref 3.9–10.3)

## 2015-05-04 MED ORDER — HEPARIN SOD (PORK) LOCK FLUSH 100 UNIT/ML IV SOLN
500.0000 [IU] | Freq: Once | INTRAVENOUS | Status: AC
  Filled 2015-05-04: qty 5

## 2015-05-04 MED ORDER — SODIUM CHLORIDE 0.9% FLUSH
10.0000 mL | INTRAVENOUS | Status: AC | PRN
  Filled 2015-05-04: qty 10

## 2015-05-04 NOTE — Progress Notes (Unsigned)
Patient scheduled for port flush for access for CT scan 04/07/15. Patient requests labs peripherally, does not want to go home with needle accessed for scan next day. Patient sent back to phlebotomist for lab draw and flush appt cancelled.

## 2015-05-05 ENCOUNTER — Ambulatory Visit: Payer: PPO | Admitting: Physical Therapy

## 2015-05-05 ENCOUNTER — Encounter (HOSPITAL_COMMUNITY): Payer: Self-pay

## 2015-05-05 ENCOUNTER — Ambulatory Visit (HOSPITAL_COMMUNITY)
Admission: RE | Admit: 2015-05-05 | Discharge: 2015-05-05 | Disposition: A | Discharge status: Home / Self Care | Payer: PPO | Source: Ambulatory Visit | Attending: Oncology | Admitting: Oncology

## 2015-05-05 DIAGNOSIS — R932 Abnormal findings on diagnostic imaging of liver and biliary tract: Secondary | ICD-10-CM | POA: Insufficient documentation

## 2015-05-05 DIAGNOSIS — C786 Secondary malignant neoplasm of retroperitoneum and peritoneum: Secondary | ICD-10-CM | POA: Diagnosis not present

## 2015-05-05 DIAGNOSIS — I8500 Esophageal varices without bleeding: Secondary | ICD-10-CM | POA: Insufficient documentation

## 2015-05-05 DIAGNOSIS — Z8543 Personal history of malignant neoplasm of ovary: Secondary | ICD-10-CM | POA: Diagnosis not present

## 2015-05-05 DIAGNOSIS — C569 Malignant neoplasm of unspecified ovary: Secondary | ICD-10-CM | POA: Insufficient documentation

## 2015-05-05 LAB — CA 125: CANCER ANTIGEN (CA) 125: 100.3 U/mL — AB (ref 0.0–38.1)

## 2015-05-05 MED ORDER — IOHEXOL 300 MG/ML  SOLN
100.0000 mL | Freq: Once | INTRAMUSCULAR | Status: AC | PRN
  Administered 2015-05-05: 100 mL via INTRAVENOUS

## 2015-05-06 ENCOUNTER — Ambulatory Visit (HOSPITAL_BASED_OUTPATIENT_CLINIC_OR_DEPARTMENT_OTHER): Payer: PPO | Admitting: Oncology

## 2015-05-06 ENCOUNTER — Encounter: Payer: Self-pay | Admitting: *Deleted

## 2015-05-06 ENCOUNTER — Telehealth: Payer: Self-pay | Admitting: Oncology

## 2015-05-06 ENCOUNTER — Ambulatory Visit (HOSPITAL_BASED_OUTPATIENT_CLINIC_OR_DEPARTMENT_OTHER): Payer: PPO

## 2015-05-06 ENCOUNTER — Ambulatory Visit: Payer: PPO | Admitting: Physical Therapy

## 2015-05-06 VITALS — BP 126/89 | HR 60 | Temp 97.1°F | Resp 18

## 2015-05-06 DIAGNOSIS — C801 Malignant (primary) neoplasm, unspecified: Secondary | ICD-10-CM

## 2015-05-06 DIAGNOSIS — Z5112 Encounter for antineoplastic immunotherapy: Secondary | ICD-10-CM | POA: Diagnosis not present

## 2015-05-06 DIAGNOSIS — C569 Malignant neoplasm of unspecified ovary: Secondary | ICD-10-CM

## 2015-05-06 DIAGNOSIS — C786 Secondary malignant neoplasm of retroperitoneum and peritoneum: Secondary | ICD-10-CM | POA: Diagnosis not present

## 2015-05-06 DIAGNOSIS — D696 Thrombocytopenia, unspecified: Secondary | ICD-10-CM | POA: Diagnosis not present

## 2015-05-06 MED ORDER — SODIUM CHLORIDE 0.9 % IV SOLN
14.5000 mg/kg | Freq: Once | INTRAVENOUS | Status: AC
  Administered 2015-05-06: 1100 mg via INTRAVENOUS
  Filled 2015-05-06: qty 36

## 2015-05-06 MED ORDER — SODIUM CHLORIDE 0.9 % IV SOLN
Freq: Once | INTRAVENOUS | Status: AC
  Administered 2015-05-06: 12:00:00 via INTRAVENOUS

## 2015-05-06 MED ORDER — HEPARIN SOD (PORK) LOCK FLUSH 100 UNIT/ML IV SOLN
500.0000 [IU] | Freq: Once | INTRAVENOUS | Status: AC | PRN
  Administered 2015-05-06: 500 [IU]
  Filled 2015-05-06: qty 5

## 2015-05-06 MED ORDER — SODIUM CHLORIDE 0.9 % IJ SOLN
10.0000 mL | INTRAMUSCULAR | Status: DC | PRN
  Administered 2015-05-06: 10 mL
  Filled 2015-05-06: qty 10

## 2015-05-06 NOTE — Progress Notes (Signed)
Hematology and Oncology Follow Up Visit  [name] [id number] 11/03/1955 60 y.o. 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 This was diagnosed in July of 2014.   Prior Therapy:  She is status post paracentesis performed on 09/17/2012 with the cytology confirmed the presence of adenocarcinoma and immunohistochemical stains suggest GYN etiology. She is also status post lumpectomy for breast cancer diagnosed 25 years ago followed by radiation and chemotherapy under the care of Dr. Sonny Padilla. She did not receive any hormonal therapy. Chemotherapy utilizing carboplatin and Taxotere started on 10/01/2012. Avastin was added with cycle 4. This was discontinued in June of 2015.  Current therapy:  She is currently on a Avastin maintenance only. Chemotherapy was discontinued in June of 2015.  Interim History: [name] presents today for a followup visit with her husband. Since her last visit, she reports significant improvement in her overall health. She has reported better balance and decrease instability. She still have some issues with her voice and have received speech therapy after one session some minor improvement. 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  She denied any changes in her abdominal girth and denied pain or early satiety. She did report loose bowel movements related to contrast. She has not reported any bleeding issues. She has not reported any epistaxis, hematochezia or melena. She has not reported any infusion-related complications. She continues to have excellent quality of life and reasonable performance status.  She does not report any headaches, blurry vision, syncope or seizures. She denied any fever chills. Has not reported any shortness of breath or cough. She denied any pain with swallowing.  She has not any shortness  of breath or chest pain. She denied any GYN bleeding.   Her mood is stable.[name] of the review of systems is unremarkable.  Medications: I have reviewed the patient's current medications.  Current Outpatient Prescriptions  Medication Sig Dispense Refill  . Acetylcarnitine HCl (ACETYL L-CARNITINE) 500 MG CAPS Take 500 mg by mouth QID.    Marland Kitchen ALPRAZolam (XANAX) 1 MG tablet Take 1 mg by mouth at bedtime as needed for anxiety.    . betamethasone dipropionate (DIPROLENE) 0.05 % cream Apply topically 2 (two) times daily. (Patient taking differently: Apply topically as needed. ) 30 g 0  . calcium carbonate (TUMS - DOSED IN MG ELEMENTAL CALCIUM) 500 MG chewable tablet Chew 1 tablet by mouth as needed for indigestion or heartburn.    . divalproex (DEPAKOTE) 250 MG DR tablet Take 250 mg by mouth 3 (three) times daily. Pt takes Depakote DR 250 mg, 2 tablets in the morning and 3 tablets in the evening    . gabapentin (NEURONTIN) 100 MG capsule TAKE ONE CAPSULE BY MOUTH THREE TIMES A DAY 90 capsule 1  . lamoTRIgine (LAMICTAL) 150 MG tablet Take 150 mg by mouth at bedtime.  1  . levothyroxine (SYNTHROID, LEVOTHROID) 100 MCG tablet Take 100 mcg by mouth daily before breakfast.     . lidocaine-prilocaine (EMLA) cream Apply topically as needed. Apply to port with every chemotherapy. 30 g 1  . loperamide (IMODIUM A-D) 2 MG tablet Take 2 mg by mouth as needed for diarrhea or loose stools.    . ondansetron (ZOFRAN ODT) 8 MG disintegrating tablet Take 1 tablet (8 mg total) by mouth every 8 (eight) hours as needed for nausea or vomiting. 20 tablet 0  .  rOPINIRole (REQUIP) 1 MG tablet Take 1 mg by mouth at bedtime.     No current facility-administered medications for this visit.   Facility-Administered Medications Ordered in Other Visits  Medication Dose Route Frequency Provider Last Rate Last Dose  . heparin lock flush 100 unit/mL  500 Units Intravenous Once Julie Portela, MD      . sodium chloride flush (NS)  0.9 % injection 10 mL  10 mL Intravenous PRN Julie Portela, MD         Allergies:  Allergies  Allergen Reactions  . Pollen Extract Other (See Comments)    Pollen and grass causes a lot sneezing    Past Medical History, Surgical history, Social history, and Family History were reviewed and updated.   Physical Exam: Blood pressure 164/79, pulse 83, temperature 98.1 F (36.7 C), temperature source Oral, resp. rate 18, height '5\' 2"'$  (1.575 m), weight 158 lb 11.2 oz (71.986 kg), SpO2 99 %.  ECOG: 1 General appearance: Alert, awake woman Appears comfortable. Head: Normocephalic, without obvious abnormality. No oral thrush or ulcers noted. Neck: no adenopathy Lymph nodes: Cervical, supraclavicular, and axillary nodes normal. Heart:regular rate and rhythm, S1, S2 normal, no murmur, click, rub or gallop Lung:chest clear, no wheezing, rales, normal symmetric air entry Abdomin: Soft, nontender with good bowel sounds. No rebound or guarding. EXT:no erythema, induration, or nodules. No edema. Skin: No rashes or lesions.  Lab Results: Lab Results  Component Value Date   WBC 4.0 05/04/2015   HGB 12.9 05/04/2015   HCT 41.3 05/04/2015   MCV 91.1 05/04/2015   PLT 111* 05/04/2015     Chemistry      Component Value Date/Time   NA 141 05/04/2015 1018   NA 143 02/11/2015 1005   K 5.2* 05/04/2015 1018   K 4.2 02/11/2015 1005   CL 104 02/11/2015 1005   CO2 30* 05/04/2015 1018   CO2 30 02/11/2015 1005   BUN 19.7 05/04/2015 1018   BUN 16 02/11/2015 1005   CREATININE 1.0 05/04/2015 1018   CREATININE 0.69 02/11/2015 1005      Component Value Date/Time   CALCIUM 10.7* 05/04/2015 1018   CALCIUM 10.0 02/11/2015 1005   ALKPHOS 83 05/04/2015 1018   ALKPHOS 69 02/11/2015 1005   AST 21 05/04/2015 1018   AST 31 02/11/2015 1005   ALT 13 05/04/2015 1018   ALT 18 02/11/2015 1005   BILITOT 0.36 05/04/2015 1018   BILITOT 0.5 02/11/2015 1005     Results for Julie, Padilla (MRN [id number]) as  of 05/06/2015 10:40  Ref. Range 03/25/2015 10:47 04/15/2015 12:08 05/04/2015 10:18  Cancer Antigen (CA) 125 Latest Ref Range: 0.0-38.1 U/mL 79.2 (H) 85.8 (H) 100.3 (H)   EXAM: CT CHEST, ABDOMEN, AND PELVIS WITH CONTRAST  TECHNIQUE: Multidetector CT imaging of the chest, abdomen and pelvis was performed following the standard protocol during bolus administration of intravenous contrast.  CONTRAST: 164m OMNIPAQUE IOHEXOL 300 MG/ML SOLN  COMPARISON: CT of the chest, abdomen and pelvis 12/29/2014.  FINDINGS: CT CHEST FINDINGS  Mediastinum/Lymph Nodes: Heart size is normal. There is no significant pericardial fluid, thickening or pericardial calcification. There is atherosclerosis of the thoracic aorta, the great vessels of the mediastinum and the coronary arteries, including calcified atherosclerotic plaque in the left main coronary artery. No pathologically enlarged mediastinal or hilar lymph nodes. Esophagus is unremarkable in appearance. Small distal esophageal varices. No axillary lymphadenopathy. Surgical clips in the right axilla from prior lymph node dissection. Right internal jugular single-lumen porta  cath with tip terminating at the superior cavoatrial junction.  Lungs/Pleura: No suspicious appearing pulmonary nodules or masses. No acute consolidative airspace disease. No pleural effusions.  Musculoskeletal/Soft Tissues: There are no aggressive appearing lytic or blastic lesions noted in the visualized portions of the skeleton. Status post right modified radical mastectomy. Small amount of soft tissue scarring in the medial aspect of the right chest wall anteriorly appears similar to the prior study, and has some dystrophic calcification associated with it.  CT ABDOMEN AND PELVIS FINDINGS  Hepatobiliary: The liver has a slightly shrunken appearance and nodular contour, suggesting underlying cirrhosis. No discrete cystic or solid hepatic lesions are  identified. No intra or extrahepatic biliary ductal dilatation. Gallbladder is normal in appearance.  Pancreas: No pancreatic mass. No pancreatic ductal dilatation. No pancreatic or peripancreatic fluid or inflammatory changes.  Spleen: Unremarkable.  Adrenals/Urinary Tract: Sub cm low-attenuation lesion in the anterior aspect of the lower pole of the right kidney is too small to definitively characterize, but is similar to the prior study, presumably a tiny cyst. Left kidney and bilateral adrenal glands are normal in appearance. No hydroureteronephrosis. Urinary bladder is nearly decompressed, but otherwise unremarkable in appearance.  Stomach/Bowel: The appearance of the stomach is normal. No pathologic dilatation of small bowel or colon. The appendix is not confidently identified and may be surgically absent. Regardless, there are no inflammatory changes adjacent to the cecum to suggest presence of an acute appendicitis at this time.  Vascular/Lymphatic: Atherosclerosis throughout the abdominal and pelvic vasculature, without evidence of aneurysm or dissection. Portal vein is dilated measuring up to 17 mm in diameter. Recannulized paraumbilical vein. No definite lymphadenopathy noted in the abdomen or pelvis.  Reproductive: Uterus and ovaries are unremarkable in appearance.  Other: Trace volume of ascites. Multiple large soft tissue masses are again noted throughout the omentum. These are generally increased in size compared to the prior examination. This is best demonstrated in the central and left lower abdomen with the largest omental mass in this region currently measures 2.2 x 13.5 cm (image 84 of series 2), and previously measured up to 2.2 x 8.7 cm on prior study 12/29/2014. Bulky omental implants in the subhepatic region are also clearly increased in size, but are difficult to directly compare with the prior study secondary to slight changes in the position of  each of these lesions the largest lesion in the right subhepatic region currently measures up to 9.8 x 2.8 cm on image 74 of series 2 (previously 2.9 x 8.2 cm). No pneumoperitoneum.  Musculoskeletal: There are no aggressive appearing lytic or blastic lesions noted in the visualized portions of the skeleton.  IMPRESSION: 1. Progression of widespread peritoneal metastases, predominantly involving the omentum, as discussed above. 2. No other signs of metastatic disease to the solid viscera the abdomen or pelvis, or in the thorax. 3. Morphologic changes in the liver compatible with cirrhosis. This is associated with small distal esophageal varices at this time. No suspicious hypervascular hepatic lesions are noted at this time. 4. Additional incidental findings, as above.     Impression and Plan:  60 year old woman with the following issues:  1. Peritoneal carcinomatosis with omental involvement. The pathology confirmed the presence of adenocarcinoma of likely GYN etiology. Her CA 125 was elevated at 333.5 on 09/19/2012. She is status post systemic chemotherapy with an excellent response utilizing carboplatin and Taxotere and a Avastin . This therapy was held in June of 2015 and currently on Avastin maintenance.   CT scan obtained  on 05/05/2015 as well as her tumor markers were reviewed today and they show clear progression. Her CA-125 is up to 100 and her CT scan showed increase in her peritoneal disease. She remains in excellent health and shape and desire continue treatment.  Options of treatment were reviewed today which include single agent taxanes, Doxil, topotecan, gemcitabine among others. Given her residual neuropathy and thrombocytopenia, I feel Doxil would be a reasonable option to proceed with. Complications associated with this medication were reviewed and include nausea, vomiting, myelosuppression, hand-foot syndrome and heart failure. The benefit would be to reduce the  cancer burden and continue to palliate her malignancy.  She is agreeable to proceed after we obtain an echocardiogram for baseline purposes. She will receive 40 mg/m every 4 weeks. She'll start tentatively on 05/20/2015.  2. IV access: Port-A-Cath is in place without any issues or complications.  3. Thrombocytopenia: Her platelets count continues to improve without any bleeding episodes. CT scan did show evidence of cirrhosis of the liver and possible splenic sequestration which certainly explain her thrombocytopenia. Her plus adequate and should receive systemic chemotherapy without any decline.  4. Vocal cord dysfunction: She continues to have issues with her voice and have received consultation from speech therapy. ENT referral was recommended because of vocal cord dysfunction and we will refer her to have that evaluated.  5. Anxiety and depression: Her mood appeared to be stable and have accepted the news of cancer progression rather well and she is ready to proceed with the next phase of therapy.  6. Genetic counseling: This was discussed also in detail today. Given her history of ovarian cancer and breast cancer I have discussed with her the importance of genetic counseling previously and reiterated today. I think is critical to obtain BRCA testing at least for the potential use of Olaparib down the line. She is agreeable at this time and we'll make the appropriate referrals.  7. Hypercalcemia: Very mild at this time and asymptomatic. We'll continue to monitor this closely and if she develops hypercalcemia malignancy, we will treat appropriately.  8. The followup: She will in 2 weeks to receive the first cycle of Doxil and we will be evaluated before her second cycle.    [facility], MD 3/17/201710:37 AM

## 2015-05-06 NOTE — Telephone Encounter (Signed)
Gave adn printed aptp sched and avs fo rpt for march adn April pt will see Dr. Redmond Baseman on 4.7.17 @ 1:20

## 2015-05-06 NOTE — Patient Instructions (Signed)
[facility] Discharge Instructions for Patients Receiving Chemotherapy  Today you received the following chemotherapy agents Avastin To help prevent nausea and vomiting after your treatment, we encourage you to take your nausea medication as prescribed.   If you develop nausea and vomiting that is not controlled by your nausea medication, call the clinic.   BELOW ARE SYMPTOMS THAT SHOULD BE REPORTED IMMEDIATELY:  *FEVER GREATER THAN 100.5 F  *CHILLS WITH OR WITHOUT FEVER  NAUSEA AND VOMITING THAT IS NOT CONTROLLED WITH YOUR NAUSEA MEDICATION  *UNUSUAL SHORTNESS OF BREATH  *UNUSUAL BRUISING OR BLEEDING  TENDERNESS IN MOUTH AND THROAT WITH OR WITHOUT PRESENCE OF ULCERS  *URINARY PROBLEMS  *BOWEL PROBLEMS  UNUSUAL RASH Items with * indicate a potential emergency and should be followed up as soon as possible.  Feel free to call the clinic you have any questions or concerns. The clinic phone number is [phone].  Please show the CHEMO ALERT CARD at check-in to the Emergency Department and triage nurse.   

## 2015-05-11 ENCOUNTER — Other Ambulatory Visit: Payer: Self-pay

## 2015-05-11 ENCOUNTER — Telehealth: Payer: Self-pay | Admitting: Oncology

## 2015-05-11 ENCOUNTER — Ambulatory Visit: Payer: PPO | Admitting: Physical Therapy

## 2015-05-11 DIAGNOSIS — R269 Unspecified abnormalities of gait and mobility: Secondary | ICD-10-CM

## 2015-05-11 DIAGNOSIS — Z1231 Encounter for screening mammogram for malignant neoplasm of breast: Secondary | ICD-10-CM

## 2015-05-11 DIAGNOSIS — R2681 Unsteadiness on feet: Secondary | ICD-10-CM

## 2015-05-11 DIAGNOSIS — M436 Torticollis: Secondary | ICD-10-CM

## 2015-05-11 DIAGNOSIS — R42 Dizziness and giddiness: Secondary | ICD-10-CM

## 2015-05-11 NOTE — Patient Instructions (Signed)
Feet Heel-Toe "Tandem", Head Motion - Eyes Open    Stand with your back to a corner with a stable chair in front of you. With eyes open, right heel next to left toes, move head slowly: up and down 10 times. Switch feet around, then move head slow up and down 10 more times. Perform this 2 times per day.

## 2015-05-11 NOTE — Telephone Encounter (Signed)
sw pt regarding to 3.23 echo at 11.Marland KitchenMarland KitchenMarland Kitchen

## 2015-05-11 NOTE — Therapy (Signed)
[facility] [address] [place], [place], [address] Phone: [phone]   Fax:  [phone]  Physical Therapy Treatment  Patient Details  Name: [name] MRN: [id number] Date of Birth: Jul 16, 1955 Referring Provider: Sarina Ill, MD  Encounter Date: 05/11/2015      PT End of Session - 05/11/15 1752    Visit Number 10   Number of Visits 13   Date for PT Re-Evaluation 05/20/15   Authorization Type Medicare - G Codes every 10 visits   PT Start Time 1400   PT Stop Time 8676   PT Time Calculation (min) 53 min   Activity Tolerance Patient tolerated treatment well;No increased pain   Behavior During Therapy [facility] for tasks assessed/performed      Past Medical History  Diagnosis Date  . Premature menopause   . Osteopenia 03/2009    t score -2.1 FRAX 4.6/0.4  . Hypothyroidism   . Depression   . Anxiety   . Hyperlipidemia   . Bipolar disorder ([place])   . ADD (attention deficit disorder)   . Hematochezia   . Allergy     seasonal  . Maintenance chemotherapy     Pt has chemo every 3 weeks (on Friday)  . Sleep apnea     mild  . Breast cancer ([place]) 1989    right breast  . Diabetes mellitus     pt denies DM noe meds    Past Surgical History  Procedure Laterality Date  . Breast surgery  1989    RIGHT LUMPECTOMY, RADIATION AND CHEMO  . Hysteroscopy  2011    Polyp  . Pelvic laparoscopy/ hysteroscopy  1996  . Foot surgery  2013    BILATERAL     There were no vitals filed for this visit.  Visit Diagnosis:  Stiffness of neck  Dizziness and giddiness  Unsteadiness  Abnormality of gait      Subjective Assessment - 05/11/15 1406    Subjective "I haven't been doing exercises like I should; I feel so much better when I do though." Pt reports having a tough week due to having just found out tumor marker was elevated. Pt will need to start a stronger form of chemotherapy soon. Pt explained, "It was  unexpected. The doctor said this chemo could affect my walking more."   Pertinent History PMH significant: omentum cancer (active; chemo every 3 weeks); h/o breast cancer s/p R lumpectomy (1989); bipolar disorder, anxiety, depression, OCD, ADD   Patient Stated Goals "To not be dizzy and to be balanced."   Currently in Pain? No/denies            [facility] PT Assessment - 05/11/15 0001    AROM   Cervical - Right Side Bend 13  soreness, stiffness   Cervical - Left Side Bend 24   Cervical - Right Rotation 36   Cervical - Left Rotation 34   Dynamic Gait Index   Level Surface Normal   Change in Gait Speed Normal   Gait with Horizontal Head Turns Mild Impairment   Gait with Vertical Head Turns Normal   Gait and Pivot Turn Mild Impairment   Step Over Obstacle Mild Impairment   Step Around Obstacles Normal   Steps Mild Impairment   Total Score 20                     [facility] Adult PT Treatment/Exercise - 05/11/15 0001    Bed Mobility   Bed Mobility  Supine to Sit;Sit to Supine   Supine to Sit 7: Independent  using logroll technique   Sit to Supine 7: Independent  using logroll technique   Ambulation/Gait   Ambulation/Gait Yes   Ambulation/Gait Assistance 6: Modified independent (Device/Increase time)   Ambulation/Gait Assistance Details x550' outdoors, x250' indoors   Ambulation Distance (Feet) 800 Feet   Assistive device None   Gait Pattern Step-through pattern;Decreased stride length  avoids head turns during gait; turns en bloc   Stairs Yes   Stairs Assistance 5: Supervision;4: Min guard   Stairs Assistance Details (indicate cue type and reason) To simulate primary home entrance, negotiated 4 stairs without rails x5 total trials. Pt consistently able to ascend with reciprocal pattern with supervision, step-to pattern with mod I. During descent, however, pt reporting increased disequilibrium and fear of falling. During trial 2, cued pt to descend laterally; however, no  improvement in stability. During subsequent trials, pt descended with [facility], initially requiring cueing for sequencing/technique with effective within-session carryover.   Stair Management Technique No rails;Step to pattern;Forwards;Alternating pattern;With cane   Number of Stairs 20   Height of Stairs 6   Ramp 6: Modified independent (Device)   Curb 5: Supervision;6: Modified independent (Device/increase time)  Mod I to ascend, (S) to descend   Curb Details (indicate cue type and reason) Trialed curb step negotiation with SPC; however, pt continues to require supervision and reports decreased confidence when using SPC.   Posture/Postural Control   Posture/Postural Control Postural limitations   Postural Limitations Increased thoracic kyphosis;Posterior pelvic tilt;Flexed trunk   Posture Comments Improved cervical spine posture (less forward head posture) during this session, as compared with previous sessions. Pt continues to demo minimal cervical spine movement during functional mobility, gait.                PT Education - 05/11/15 1743    Education provided Yes   Education Details DGI findings and progress, decreased fall risk. HEP: added vertical head turns in semi tandem to address disequilibrium during stair negotiation.   Person(s) Educated Patient   Methods Explanation;Demonstration;Verbal cues;Handout   Comprehension Verbalized understanding;Returned demonstration          PT Short Term Goals - 05/11/15 1754    PT SHORT TERM GOAL #1   Title Pt will perform initial HEP with mod I using paper handout to maximize functional gains made in PT.  (Target date: 04/18/15)   Baseline 3/8: Required use of paper handout and min cueing for technique with HEP.   Status Partially Met   PT SHORT TERM GOAL #2   Title Rule out orthostatic hypotension as contributing factor to dizziness.  (Target date: 04/18/15)   Baseline Orthostatic hypotension ruled out 2/6.   Status Achieved   PT  SHORT TERM GOAL #3   Title Pt will improve active cervical spine lateral flexion to 15 degrees bilaterally to progress toward normal neck ROM.  (Target date: 04/18/15)   Baseline baseline:  9 * to R;   6* to L.   3/1:  11* to R;  10* to L.   Partially met on 3/22, as lateral flexion = 13* to R, 24* to L.   Status Partially Met   PT SHORT TERM GOAL #4   Title Pt will improve active cervical spine rotation to 40 degrees bilaterally progress toward normal neck ROM.   (Target date: 04/18/15)   Baseline baseline: 26* to R;  19* to L.   3/1: 26* to R; 33* to  L   Status Deferred  See LTG's.   PT SHORT TERM GOAL #5   Title Pt will improve DGI score from 13/24 to 16/24 to indicate improved dynamic gait stability.  (Target date: 04/18/15)   Baseline 3/1: DGI = 16/24   Status Achieved   PT SHORT TERM GOAL #6   Title Pt will improve gait speed from 2.42 ft/sec to > 2.62 ft/sec to indicate functional status of community ambulator.  (Target date: 04/18/15)   Baseline 3/1: gait velocity = 3.29 ft/sec   Status Achieved           PT Long Term Goals - 05/11/15 1410    PT LONG TERM GOAL #1   Title Pt will improve active cervical spine lateral flexion to 15 degrees bilaterally to progress toward normal neck ROM. (Target date: 05/16/15)   Baseline 3/22:  24 degrees to L; 15 degrees to R.   Status Partially Met   PT LONG TERM GOAL #2   Title Pt will improve active cervical spine rotation to 35 degrees bilaterally progress toward normal neck ROM.   (Target date: 05/16/15)   Baseline 3/22: 36 degrees to R, 34 degrees to L.   Status Partially Met   PT LONG TERM GOAL #3   Title Pt will improve DGI score from 13/24 to > / = 20/24 to indicate decreased fall risk.    (Target date: 05/16/15)   Baseline Met 3/22.   Status Achieved   PT LONG TERM GOAL #4   Title Pt will decrease DHI score from 72 to < / = 54 to indicate significant decrease in pt-perceived disability due to dizziness.   (Target date: 05/16/15)    Status On-going   PT LONG TERM GOAL #5   Title Pt will independently negotiate 2 stairs without rails to indicate pt abiliy to safely use primary home entrance.    (Target date: 05/16/15)   Baseline 3/22: requires (S) to ascend, min guard to descend.   Status On-going   PT LONG TERM GOAL #6   Title Pt will independently ambulate > 500' over unlevel, paved surfaces and negotiate standard ramp and curb step to indicate safety with community mobility.    (Target date: 05/16/15)   Baseline Mod I (increased time, no device) with ambulation > 500' and ramp negotiation but requires (S) for curb step negotiation without AD.   Status On-going   PT LONG TERM GOAL #7   Title Pt will consistently perform supine > sit independently to indicate increased independence with bed mbility.   (Target date: 05/16/15)   Baseline Met 3/22.   Status Achieved               Plan - 05/11/15 1805    Clinical Impression Statement Pt met LTG's for bed mobility and DGI, suggesting improved dynamic gait stability. Pt partially met goals for cervical spine AROM; R lateral flexion AROM continues to be very limited. Pt continues to require (S) to min guard to descend stairs; does report disequilibrium with stair/curb descent. Provided home exercise for vertical head turns in semi tandem to address disequilibrium/imbalance. Tentatively plan to DC after next session; pt in full agreement with planned DC.    Pt will benefit from skilled therapeutic intervention in order to improve on the following deficits Pain;Postural dysfunction;Decreased balance;Abnormal gait;Decreased knowledge of use of DME;Decreased range of motion;Decreased mobility;Hypomobility;Impaired sensation;Impaired flexibility;Decreased activity tolerance;Increased fascial restricitons;Decreased strength   Rehab Potential Good   Clinical Impairments Affecting Rehab Potential high insurance  copay; currently undergoing chemotherapy   PT Frequency Other (comment)   2x/week for 4 weeks then 1x/week for 4 weeks   PT Duration --  see above   PT Treatment/Interventions ADLs/Self Care Home Management;Vestibular;Manual techniques;Patient/family education;Neuromuscular re-education;Gait training;Stair training;Functional mobility training;Therapeutic activities;Therapeutic exercise;Balance training;DME Instruction   PT Next Visit Plan Finish checking LTG's and plan to DC. GCODES.   Consulted and Agree with Plan of Care Patient          G-Codes - 2015/05/29 1759    Functional Assessment Tool Used DGI = 20/24   Functional Limitation Mobility: Walking and moving around   Mobility: Walking and Moving Around Current Status [phone]) At least 1 percent but less than 20 percent impaired, limited or restricted   Mobility: Walking and Moving Around Goal Status [phone]) At least 1 percent but less than 20 percent impaired, limited or restricted     Physical Therapy Progress Note  Dates of Reporting Period: 03/21/15 to 05/29/2015  Objective Reports of Subjective Statement: See Subjective section above.  Objective Measurements: DGI = 20/24; see above for cervical spine AROM findings.  Goal Update: See short and long term goals and goal statuses above for details.  Plan: Plan to DC from outpatient PT at next session.  Reason Skilled Services are Required: To maximize stability/independence with functional mobility. To increase cervical spine AROM to increase pt safety with driving.   Problem List Patient Active Problem List   Diagnosis Date Noted  . Problem with voice production 04/26/2015    Class: Acute  . Weakness of voice 04/26/2015    Class: Acute  . Pathologic change of voice 04/26/2015    Class: Acute  . Dizziness 03/29/2015  . Ovarian cancer ([place]) 08/13/2013  . Malignant neoplasm of female breast ([place]) 09/19/2012  . Lithium use 09/17/2011  . Abnormal coordination 09/17/2011  . Premature menopause   . Depression   . Anxiety   . Bipolar disorder  ([place])   . ADD (attention deficit disorder)   . Diabetes mellitus   . NONSPECIFIC ABNORMAL ELECTROCARDIOGRAM 11/04/2009  . ALLERGIC RHINITIS 09/01/2009  . INCONTINENCE 09/01/2009  . DIAB W/O COMP TYPE II/UNS NOT STATED UNCNTRL 07/28/2008  . NONSPEC ELEVATION OF LEVELS OF TRANSAMINASE/LDH 07/28/2008  . MOTOR RESTLESSNESS 02/19/2008  . Internal hemorrhoids without mention of complication 16/38/4665  . ESOPHAGEAL STRICTURE 09/15/2007  . DYSPHAGIA 09/15/2007  . OBSTRUCTIVE SLEEP APNEA 07/21/2007  . HYPERLIPIDEMIA 07/07/2007  . Osteopenia 02/20/2007  . HYPOTHYROIDISM 10/01/2006  . BREAST CANCER, HX OF 08/12/2006    Billie Ruddy, PT, DPT [facility] [address] [place], [place], [address] Phone: [phone]   Fax:  [phone] May 29, 2015, 6:06 PM  Name: [name] MRN: [id number] Date of Birth: 01/11/1956

## 2015-05-12 ENCOUNTER — Other Ambulatory Visit: Payer: Self-pay | Admitting: Oncology

## 2015-05-12 ENCOUNTER — Ambulatory Visit (HOSPITAL_COMMUNITY)
Admission: RE | Admit: 2015-05-12 | Discharge: 2015-05-12 | Disposition: A | Discharge status: Home / Self Care | Payer: PPO | Source: Ambulatory Visit | Attending: Oncology | Admitting: Oncology

## 2015-05-12 DIAGNOSIS — J383 Other diseases of vocal cords: Secondary | ICD-10-CM

## 2015-05-12 DIAGNOSIS — C569 Malignant neoplasm of unspecified ovary: Secondary | ICD-10-CM | POA: Diagnosis not present

## 2015-05-12 DIAGNOSIS — Z01810 Encounter for preprocedural cardiovascular examination: Secondary | ICD-10-CM | POA: Diagnosis not present

## 2015-05-12 NOTE — Progress Notes (Signed)
*  PRELIMINARY RESULTS* Echocardiogram 2D Echocardiogram has been performed.  [name] 05/12/2015, 12:38 PM

## 2015-05-16 ENCOUNTER — Encounter: Payer: Self-pay | Admitting: Oncology

## 2015-05-16 ENCOUNTER — Ambulatory Visit: Payer: PPO

## 2015-05-16 DIAGNOSIS — H524 Presbyopia: Secondary | ICD-10-CM | POA: Diagnosis not present

## 2015-05-16 DIAGNOSIS — H35363 Drusen (degenerative) of macula, bilateral: Secondary | ICD-10-CM | POA: Diagnosis not present

## 2015-05-18 ENCOUNTER — Telehealth: Payer: Self-pay | Admitting: Neurology

## 2015-05-18 ENCOUNTER — Other Ambulatory Visit: Payer: Self-pay | Admitting: Oncology

## 2015-05-18 DIAGNOSIS — R42 Dizziness and giddiness: Secondary | ICD-10-CM

## 2015-05-18 NOTE — Telephone Encounter (Signed)
Called pt back. LVM for pt letting her know Dr Jaynee Eagles is okay with ordering labs for her to have done in our office the day she comes for PT. Gave [facility] phone number if she has further questions.   Placed lab orders per Dr Jaynee Eagles verbal order.

## 2015-05-18 NOTE — Telephone Encounter (Signed)
Patient is calling. She saw Dr. Jaynee Eagles on 03-27-14 and had an order for labwork from another doctor. Dr. Jaynee Eagles said it is ok for her to have that lab done in our office even though another doctor ordered it. The patient says she has to have PT in our building and has another order for labwork from another doctor and wants to know if this can be done here. Please call and advise.

## 2015-05-18 NOTE — Telephone Encounter (Signed)
Called pt. Advised per Dr Jaynee Eagles that we normally do not do lab work ordered by others providers. She states that her other provider wants a depakote and lamictal level, CBC w/ diff, and CBC (CMET). Advised I will have to ask Dr Jaynee Eagles if ok first. I will call back to let her know. She understands and states I can leave a message if she does not answer. She is going for PT on 05/23/15.

## 2015-05-20 ENCOUNTER — Emergency Department (HOSPITAL_COMMUNITY)
Admission: EM | Admit: 2015-05-20 | Discharge: 2015-05-20 | Disposition: A | Discharge status: Home / Self Care | Payer: PPO | Attending: Emergency Medicine | Admitting: Emergency Medicine

## 2015-05-20 ENCOUNTER — Ambulatory Visit (HOSPITAL_BASED_OUTPATIENT_CLINIC_OR_DEPARTMENT_OTHER): Payer: PPO | Admitting: Nurse Practitioner

## 2015-05-20 ENCOUNTER — Ambulatory Visit: Payer: PPO

## 2015-05-20 ENCOUNTER — Ambulatory Visit (HOSPITAL_BASED_OUTPATIENT_CLINIC_OR_DEPARTMENT_OTHER): Payer: PPO

## 2015-05-20 ENCOUNTER — Encounter (HOSPITAL_COMMUNITY): Payer: Self-pay | Admitting: Emergency Medicine

## 2015-05-20 ENCOUNTER — Other Ambulatory Visit (HOSPITAL_BASED_OUTPATIENT_CLINIC_OR_DEPARTMENT_OTHER): Payer: PPO

## 2015-05-20 VITALS — BP 94/56 | HR 77 | Temp 98.1°F | Resp 16

## 2015-05-20 DIAGNOSIS — E039 Hypothyroidism, unspecified: Secondary | ICD-10-CM | POA: Insufficient documentation

## 2015-05-20 DIAGNOSIS — Z853 Personal history of malignant neoplasm of breast: Secondary | ICD-10-CM | POA: Insufficient documentation

## 2015-05-20 DIAGNOSIS — X58XXXA Exposure to other specified factors, initial encounter: Secondary | ICD-10-CM | POA: Insufficient documentation

## 2015-05-20 DIAGNOSIS — T451X5A Adverse effect of antineoplastic and immunosuppressive drugs, initial encounter: Secondary | ICD-10-CM | POA: Insufficient documentation

## 2015-05-20 DIAGNOSIS — F419 Anxiety disorder, unspecified: Secondary | ICD-10-CM | POA: Diagnosis not present

## 2015-05-20 DIAGNOSIS — T7840XA Allergy, unspecified, initial encounter: Secondary | ICD-10-CM

## 2015-05-20 DIAGNOSIS — Z95828 Presence of other vascular implants and grafts: Secondary | ICD-10-CM

## 2015-05-20 DIAGNOSIS — Y9289 Other specified places as the place of occurrence of the external cause: Secondary | ICD-10-CM | POA: Insufficient documentation

## 2015-05-20 DIAGNOSIS — C569 Malignant neoplasm of unspecified ovary: Secondary | ICD-10-CM | POA: Diagnosis not present

## 2015-05-20 DIAGNOSIS — C50919 Malignant neoplasm of unspecified site of unspecified female breast: Secondary | ICD-10-CM

## 2015-05-20 DIAGNOSIS — Y9389 Activity, other specified: Secondary | ICD-10-CM | POA: Insufficient documentation

## 2015-05-20 DIAGNOSIS — F319 Bipolar disorder, unspecified: Secondary | ICD-10-CM | POA: Insufficient documentation

## 2015-05-20 DIAGNOSIS — Z5111 Encounter for antineoplastic chemotherapy: Secondary | ICD-10-CM | POA: Diagnosis not present

## 2015-05-20 DIAGNOSIS — E119 Type 2 diabetes mellitus without complications: Secondary | ICD-10-CM | POA: Diagnosis not present

## 2015-05-20 DIAGNOSIS — C786 Secondary malignant neoplasm of retroperitoneum and peritoneum: Secondary | ICD-10-CM | POA: Diagnosis not present

## 2015-05-20 DIAGNOSIS — M8580 Other specified disorders of bone density and structure, unspecified site: Secondary | ICD-10-CM | POA: Diagnosis not present

## 2015-05-20 DIAGNOSIS — Z8719 Personal history of other diseases of the digestive system: Secondary | ICD-10-CM | POA: Insufficient documentation

## 2015-05-20 DIAGNOSIS — Z79899 Other long term (current) drug therapy: Secondary | ICD-10-CM | POA: Insufficient documentation

## 2015-05-20 DIAGNOSIS — Y998 Other external cause status: Secondary | ICD-10-CM | POA: Insufficient documentation

## 2015-05-20 LAB — CBC WITH DIFFERENTIAL/PLATELET
BASO%: 0.5 % (ref 0.0–2.0)
Basophils Absolute: 0 10*3/uL (ref 0.0–0.1)
EOS ABS: 0.2 10*3/uL (ref 0.0–0.5)
EOS%: 4.7 % (ref 0.0–7.0)
HEMATOCRIT: 37.6 % (ref 34.8–46.6)
HEMOGLOBIN: 11.7 g/dL (ref 11.6–15.9)
LYMPH#: 1.3 10*3/uL (ref 0.9–3.3)
LYMPH%: 33.9 % (ref 14.0–49.7)
MCH: 29.1 pg (ref 25.1–34.0)
MCHC: 31.1 g/dL — AB (ref 31.5–36.0)
MCV: 93.5 fL (ref 79.5–101.0)
MONO#: 0.5 10*3/uL (ref 0.1–0.9)
MONO%: 13.1 % (ref 0.0–14.0)
NEUT%: 47.8 % (ref 38.4–76.8)
NEUTROS ABS: 1.8 10*3/uL (ref 1.5–6.5)
PLATELETS: 73 10*3/uL — AB (ref 145–400)
RBC: 4.02 10*6/uL (ref 3.70–5.45)
RDW: 15 % — ABNORMAL HIGH (ref 11.2–14.5)
WBC: 3.8 10*3/uL — AB (ref 3.9–10.3)
nRBC: 0 % (ref 0–0)

## 2015-05-20 LAB — COMPREHENSIVE METABOLIC PANEL
ALBUMIN: 3.3 g/dL — AB (ref 3.5–5.0)
ALK PHOS: 78 U/L (ref 40–150)
ALT: 13 U/L (ref 0–55)
ANION GAP: 6 meq/L (ref 3–11)
AST: 24 U/L (ref 5–34)
BUN: 15.8 mg/dL (ref 7.0–26.0)
CALCIUM: 10 mg/dL (ref 8.4–10.4)
CHLORIDE: 107 meq/L (ref 98–109)
CO2: 26 mEq/L (ref 22–29)
CREATININE: 0.8 mg/dL (ref 0.6–1.1)
EGFR: 87 mL/min/{1.73_m2} — ABNORMAL LOW (ref >90)
Glucose: 89 mg/dl (ref 70–140)
Potassium: 4.7 mEq/L (ref 3.5–5.1)
Sodium: 139 mEq/L (ref 136–145)
Total Protein: 8.1 g/dL (ref 6.4–8.3)

## 2015-05-20 MED ORDER — DIPHENHYDRAMINE HCL 50 MG/ML IJ SOLN
25.0000 mg | Freq: Once | INTRAMUSCULAR | Status: AC
  Administered 2015-05-20: 25 mg via INTRAVENOUS

## 2015-05-20 MED ORDER — FAMOTIDINE IN NACL 20-0.9 MG/50ML-% IV SOLN
20.0000 mg | Freq: Two times a day (BID) | INTRAVENOUS | Status: DC
  Administered 2015-05-20: 20 mg via INTRAVENOUS

## 2015-05-20 MED ORDER — METHYLPREDNISOLONE SODIUM SUCC 125 MG IJ SOLR
125.0000 mg | Freq: Once | INTRAMUSCULAR | Status: AC
  Administered 2015-05-20: 125 mg via INTRAVENOUS

## 2015-05-20 MED ORDER — SODIUM CHLORIDE 0.9% FLUSH
10.0000 mL | INTRAVENOUS | Status: DC | PRN
  Administered 2015-05-20: 10 mL via INTRAVENOUS
  Filled 2015-05-20: qty 10

## 2015-05-20 MED ORDER — SODIUM CHLORIDE 0.9 % IV SOLN
10.0000 mg | Freq: Once | INTRAVENOUS | Status: AC
  Administered 2015-05-20: 10 mg via INTRAVENOUS
  Filled 2015-05-20: qty 1

## 2015-05-20 MED ORDER — DOXORUBICIN HCL LIPOSOMAL CHEMO INJECTION 2 MG/ML
40.0000 mg/m2 | Freq: Once | INTRAVENOUS | Status: AC
  Administered 2015-05-20: 70 mg via INTRAVENOUS
  Filled 2015-05-20: qty 10

## 2015-05-20 MED ORDER — PREDNISONE 10 MG PO TABS: 40.0000 mg | ORAL_TABLET | Freq: Every day | ORAL | Status: AC

## 2015-05-20 MED ORDER — HEPARIN SOD (PORK) LOCK FLUSH 100 UNIT/ML IV SOLN
500.0000 [IU] | Freq: Once | INTRAVENOUS | Status: AC
  Administered 2015-05-20: 500 [IU]
  Filled 2015-05-20: qty 5

## 2015-05-20 MED ORDER — SODIUM CHLORIDE 0.9 % IV SOLN
Freq: Once | INTRAVENOUS | Status: AC
  Administered 2015-05-20: 12:00:00 via INTRAVENOUS

## 2015-05-20 MED ORDER — SODIUM CHLORIDE 0.9% FLUSH
10.0000 mL | INTRAVENOUS | Status: DC | PRN
  Filled 2015-05-20: qty 10

## 2015-05-20 MED ORDER — HEPARIN SOD (PORK) LOCK FLUSH 100 UNIT/ML IV SOLN
500.0000 [IU] | Freq: Once | INTRAVENOUS | Status: DC | PRN
  Filled 2015-05-20: qty 5

## 2015-05-20 MED ORDER — DIPHENHYDRAMINE HCL 50 MG/ML IJ SOLN
25.0000 mg | Freq: Once | INTRAMUSCULAR | Status: AC
Start: 2015-05-20 — End: 2015-05-20
  Administered 2015-05-20: 25 mg via INTRAVENOUS

## 2015-05-20 NOTE — Progress Notes (Signed)
1224 Patient began c/o lower back pain and facial flushing. Doxil interrupted, IV tubing disconnected, and 10 mL aspirated from port. NS initiated. Hypersensitivity protocol initiated. Retta Mac, NP came to treatment area. Patient condition improved rapidly. 1245 Patient states all symptoms resolved at this time.  1315 Doxil restarted. 1335 Patient began c/o "not feeling right."  Verbalized the need to use the restroom.  Noted facial flushing.  Vital signs as recorded.  Retta Mac, NP @ chairside.  Rapid Response Team notified.  Oxygen increased to 4 Lpm via [place].  See VS flowsheet for vitals.  Patient transferred to ED for evaluation.  IV NS continued during transfer.  Awake and alert on transfer.

## 2015-05-20 NOTE — ED Notes (Addendum)
Pt arrived from [facility] after having a reaction to chemo infusion. RR Colletta [place] reports that shortly after starting her infusion, pt c/o lower back pain and flushing. Pt received benadryl, pepcid and solumedrol. Pt was hypotensive at this time and has body splotching with SOB. Pt has received a total of 50 mg Benadryl. Pt VSS at this time. Pt is talking, answering questions and A&O.

## 2015-05-20 NOTE — ED Notes (Signed)
Dr Zenia Resides notified of pt status

## 2015-05-20 NOTE — ED Provider Notes (Signed)
CSN: [id number]     Arrival date & time 05/20/15  1357 History   First MD Initiated Contact with Patient 05/20/15 1414     Chief Complaint  Patient presents with  . Allergic Reaction     (Consider location/radiation/quality/duration/timing/severity/associated sxs/prior Treatment) HPI Comments: Patient arrived here to the [facility] after having allergic reaction to the chemotherapy drug. Patient was initially hypotensive. She developed hives facial flushing and chest discomfort. Was given Benadryl, Pepcid, Solu-Medrol. Feels much better at this time. Blood pressure has improved. No longer has any pruritus. Denies any trouble talking or swallowing. Has no other complaints at this time  Patient is a 60 y.o. female presenting with allergic reaction. The history is provided by the patient.  Allergic Reaction   Past Medical History  Diagnosis Date  . Premature menopause   . Osteopenia 03/2009    t score -2.1 FRAX 4.6/0.4  . Hypothyroidism   . Depression   . Anxiety   . Hyperlipidemia   . Bipolar disorder ([place])   . ADD (attention deficit disorder)   . Hematochezia   . Allergy     seasonal  . Maintenance chemotherapy     Pt has chemo every 3 weeks (on Friday)  . Sleep apnea     mild  . Breast cancer ([place]) 1989    right breast  . Diabetes mellitus     pt denies DM noe meds   Past Surgical History  Procedure Laterality Date  . Breast surgery  1989    RIGHT LUMPECTOMY, RADIATION AND CHEMO  . Hysteroscopy  2011    Polyp  . Pelvic laparoscopy/ hysteroscopy  1996  . Foot surgery  2013    BILATERAL    Family History  Problem Relation Age of Onset  . Breast cancer Maternal Aunt 30  . Diabetes Maternal Grandmother   . Heart disease Maternal Grandmother   . Heart disease Maternal Grandfather   . Hypertension Paternal Grandfather   . Breast cancer Maternal Aunt 70  . Breast cancer Maternal Aunt 68  . Ovarian cancer Neg Hx   . Colon cancer Neg Hx   . Allergies Father   .  Heart disease Mother    Social History  Substance Use Topics  . Smoking status: Never Smoker   . Smokeless tobacco: Never Used  . Alcohol Use: 0.0 oz/week    0 Standard drinks or equivalent per week     Comment: very rarely   OB History    Gravida Para Term Preterm AB TAB SAB Ectopic Multiple Living   0              Review of Systems  All other systems reviewed and are negative.     Allergies  Doxil and Pollen extract  Home Medications   Prior to Admission medications   Medication Sig Start Date End Date Taking? Authorizing Provider  ALPRAZolam Duanne Moron) 1 MG tablet Take 1 mg by mouth at bedtime as needed for anxiety.   Yes Historical Provider, MD  betamethasone dipropionate (DIPROLENE) 0.05 % cream Apply topically 2 (two) times daily. Patient taking differently: Apply 1 application topically as needed (splotchy rash).  04/22/14  Yes Wyatt Portela, MD  calcium carbonate (TUMS - DOSED IN MG ELEMENTAL CALCIUM) 500 MG chewable tablet Chew 1 tablet by mouth as needed for indigestion or heartburn.   Yes Historical Provider, MD  divalproex (DEPAKOTE) 250 MG DR tablet Take 250 mg by mouth 2 (two) times daily. 500mg  in the  morning and 1500 mg in the evening   Yes Historical Provider, MD  gabapentin (NEURONTIN) 100 MG capsule TAKE ONE CAPSULE BY MOUTH THREE TIMES A DAY 07/26/14  Yes Maryanna Shape, NP  lamoTRIgine (LAMICTAL) 150 MG tablet Take 150 mg by mouth at bedtime. 03/30/15  Yes Historical Provider, MD  levothyroxine (SYNTHROID, LEVOTHROID) 100 MCG tablet Take 100 mcg by mouth daily before breakfast.    Yes Historical Provider, MD  lidocaine-prilocaine (EMLA) cream Apply topically as needed. Apply to port with every chemotherapy. 07/16/13  Yes Carlton Adam, PA-C  loperamide (IMODIUM A-D) 2 MG tablet Take 2 mg by mouth as needed for diarrhea or loose stools.   Yes Historical Provider, MD  ondansetron (ZOFRAN ODT) 8 MG disintegrating tablet Take 1 tablet (8 mg total) by mouth every 8  (eight) hours as needed for nausea or vomiting. 06/24/14  Yes Owens Shark, NP  ondansetron (ZOFRAN) 8 MG tablet TAKE 1 TABLET BY MOUTH EVERY 8 HOURS AS NEEDED FOR NAUSEA AND VOMITING 05/18/15  Yes Wyatt Portela, MD  rOPINIRole (REQUIP) 1 MG tablet Take 1 mg by mouth at bedtime.   Yes Historical Provider, MD   BP 129/93 mmHg  Pulse 95  Resp 13  SpO2 97% Physical Exam  Constitutional: She is oriented to person, place, and time. She appears well-developed and well-nourished.  Non-toxic appearance. No distress.  HENT:  Head: Normocephalic and atraumatic.  Eyes: Conjunctivae, EOM and lids are normal. Pupils are equal, round, and reactive to light.  Neck: Normal range of motion. Neck supple. No tracheal deviation present. No thyroid mass present.  Cardiovascular: Normal rate, regular rhythm and normal heart sounds.  Exam reveals no gallop.   No murmur heard. Pulmonary/Chest: Effort normal and breath sounds normal. No stridor. No respiratory distress. She has no decreased breath sounds. She has no wheezes. She has no rhonchi. She has no rales.  Abdominal: Soft. Normal appearance and bowel sounds are normal. She exhibits no distension. There is no tenderness. There is no rebound and no CVA tenderness.  Musculoskeletal: Normal range of motion. She exhibits no edema or tenderness.  Neurological: She is alert and oriented to person, place, and time. She has normal strength. No cranial nerve deficit or sensory deficit. GCS eye subscore is 4. GCS verbal subscore is 5. GCS motor subscore is 6.  Skin: Skin is warm and dry. No abrasion and no rash noted.  Psychiatric: She has a normal mood and affect. Her speech is normal and behavior is normal.  Nursing note and vitals reviewed.   ED Course  Procedures (including critical care time) Labs Review Labs Reviewed - No data to display  Imaging Review No results found. I have personally reviewed and evaluated these images and lab results as part of my  medical decision-making.   EKG Interpretation None      MDM   Final diagnoses:  None    Patient given treatment for allergic reaction prior to arrival. Blood pressure stable here she has no rashes. She is breathing easily. We'll continue to monitor here and signed out to Dr. Vickie Epley, MD 05/20/15 [phone]

## 2015-05-20 NOTE — ED Notes (Signed)
Pt provided with crackers and ginger ale. Pt husband also given a sandwich and water. MD wants pt to remain in ED until 6pm

## 2015-05-20 NOTE — ED Notes (Signed)
Bed: RESB Expected date:  Expected time:  Means of arrival:  Comments: [facility] pt from [facility]-reaction to medication

## 2015-05-20 NOTE — Discharge Instructions (Signed)
DO NOT TAKE THE MEDICATION YOU RECEIVED TODAY AT CHEMOTHERAPY Schedule benadryl 25mg  every 6 hours for the next 48 hrs. Take Zantac daily for 3 days.   Allergies An allergy is an abnormal reaction to a substance by the body's defense system (immune system). Allergies can develop at any age. WHAT CAUSES ALLERGIES? An allergic reaction happens when the immune system mistakenly reacts to a normally harmless substance, called an allergen, as if it were harmful. The immune system releases antibodies to fight the substance. Antibodies eventually release a chemical called histamine into the bloodstream. The release of histamine is meant to protect the body from infection, but it also causes discomfort. An allergic reaction can be triggered by:  Eating an allergen.  Inhaling an allergen.  Touching an allergen. WHAT TYPES OF ALLERGIES ARE THERE? There are many types of allergies. Common types include:  Seasonal allergies. People with this type of allergy are usually allergic to substances that are only present during certain seasons, such as molds and pollens.  Food allergies.  Drug allergies.  Insect allergies.  Animal dander allergies. WHAT ARE SYMPTOMS OF ALLERGIES? Possible allergy symptoms include:  Swelling of the lips, face, tongue, mouth, or throat.  Sneezing, coughing, or wheezing.  Nasal congestion.  Tingling in the mouth.  Rash.  Itching.  Itchy, red, swollen areas of skin (hives).  Watery eyes.  Vomiting.  Diarrhea.  Dizziness.  Lightheadedness.  Fainting.  Trouble breathing or swallowing.  Chest tightness.  Rapid heartbeat. HOW ARE ALLERGIES DIAGNOSED? Allergies are diagnosed with a medical and family history and one or more of the following:  Skin tests.  Blood tests.  A food diary. A food diary is a record of all the foods and drinks you have in a day and of all the symptoms you experience.  The results of an elimination diet. An  elimination diet involves eliminating foods from your diet and then adding them back in one by one to find out if a certain food causes an allergic reaction. HOW ARE ALLERGIES TREATED? There is no cure for allergies, but allergic reactions can be treated with medicine. Severe reactions usually need to be treated at a hospital. HOW CAN REACTIONS BE PREVENTED? The best way to prevent an allergic reaction is by avoiding the substance you are allergic to. Allergy shots and medicines can also help prevent reactions in some cases. People with severe allergic reactions may be able to prevent a life-threatening reaction called anaphylaxis with a medicine given right after exposure to the allergen.   This information is not intended to replace advice given to you by your health care provider. Make sure you discuss any questions you have with your health care provider.   Document Released: 05/01/2002 Document Revised: 02/26/2014 Document Reviewed: 11/17/2013 Elsevier Interactive Patient Education [employer].

## 2015-05-20 NOTE — Progress Notes (Signed)
Per Dr. Alen Blew, it's OK to treat with today's platelet count of 73. Treating nurse notified.

## 2015-05-20 NOTE — Patient Instructions (Signed)

## 2015-05-20 NOTE — Patient Instructions (Signed)
[facility] Discharge Instructions for Patients Receiving Chemotherapy  Today you received the following chemotherapy agents :  Doxil.  To help prevent nausea and vomiting after your treatment, we encourage you to take your nausea medication as prescribed.   If you develop nausea and vomiting that is not controlled by your nausea medication, call the clinic.   BELOW ARE SYMPTOMS THAT SHOULD BE REPORTED IMMEDIATELY:  *FEVER GREATER THAN 100.5 F  *CHILLS WITH OR WITHOUT FEVER  NAUSEA AND VOMITING THAT IS NOT CONTROLLED WITH YOUR NAUSEA MEDICATION  *UNUSUAL SHORTNESS OF BREATH  *UNUSUAL BRUISING OR BLEEDING  TENDERNESS IN MOUTH AND THROAT WITH OR WITHOUT PRESENCE OF ULCERS  *URINARY PROBLEMS  *BOWEL PROBLEMS  UNUSUAL RASH Items with * indicate a potential emergency and should be followed up as soon as possible.  Feel free to call the clinic you have any questions or concerns. The clinic phone number is [phone].  Please show the CHEMO ALERT CARD at check-in to the Emergency Department and triage nurse.   

## 2015-05-21 LAB — CA 125: CANCER ANTIGEN (CA) 125: 92.9 U/mL — AB (ref 0.0–38.1)

## 2015-05-23 ENCOUNTER — Ambulatory Visit: Payer: PPO | Admitting: Physical Therapy

## 2015-05-23 ENCOUNTER — Ambulatory Visit: Payer: PPO

## 2015-05-23 ENCOUNTER — Encounter: Payer: Self-pay | Admitting: Nurse Practitioner

## 2015-05-23 DIAGNOSIS — T7840XA Allergy, unspecified, initial encounter: Secondary | ICD-10-CM | POA: Insufficient documentation

## 2015-05-23 NOTE — Assessment & Plan Note (Signed)
Patient's last restaging scan obtained on 05/05/2015 revealed progression.  Patient presented to the [facility] today to receive her first Doxil chemotherapy infusion.  However, patient experienced a hypersensitivity reaction; and was unable to complete the chemotherapy.  See further note for details.  Patient is scheduled for a flush only on 06/02/2015.  She was scheduled for flush, lab, visit, and her next cycle of chemotherapy on 06/17/2015.  Will need to review all with Dr. Alen Blew to determine new treatment plan for the patient.

## 2015-05-23 NOTE — Progress Notes (Signed)
 SYMPTOM MANAGEMENT CLINIC    Chief Complaint: Hypersensitivity reaction   HPI:  [name] 59 y.o. 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   Patient's last restaging scan obtained on 05/05/2015 revealed progression.  Patient presented to the cancer Center today to receive her first Doxil chemotherapy infusion.   Patient initially experienced some flushing only; but then proceeded to develop hypotension, and near-syncope as well.  Chemotherapy infusion was held; and patient received nitroglycerin, Pepcid, and Solu-Medrol per protocol.  Patient's blood pressure continued to trend downward; and rapid response team was called.  Patient was transported to the emergency department for further evaluation and management.  See emergency department record for further details.    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 Premature menopause   . Osteopenia 03/2009    t score -2.1 FRAX 4.6/0.4  . Hypothyroidism   . Depression   . Anxiety   . Hyperlipidemia   . Bipolar disorder (HCC)   . ADD (attention deficit disorder)   . Hematochezia   . Allergy     seasonal  . Maintenance chemotherapy     Pt has chemo every 3 weeks (on Friday)  . Sleep apnea     mild  . Breast cancer (HCC) 1989    right breast  . Diabetes mellitus     pt denies DM noe meds    Past Surgical History  Procedure Laterality Date  . Breast surgery  1989    RIGHT LUMPECTOMY, RADIATION AND CHEMO  . Hysteroscopy  2011    Polyp  . Pelvic laparoscopy/ hysteroscopy  1996  . Foot surgery  2013    BILATERAL     has HYPOTHYROIDISM; DIAB W/O COMP TYPE II/UNS NOT STATED UNCNTRL; HYPERLIPIDEMIA; OBSTRUCTIVE SLEEP APNEA; Internal hemorrhoids without mention of complication; ALLERGIC  RHINITIS; ESOPHAGEAL STRICTURE; MOTOR RESTLESSNESS; DYSPHAGIA; INCONTINENCE; NONSPEC ELEVATION OF LEVELS OF TRANSAMINASE/LDH; NONSPECIFIC ABNORMAL ELECTROCARDIOGRAM; BREAST CANCER, HX OF; Premature menopause; Osteopenia; Depression; Anxiety; Bipolar disorder (HCC); ADD (attention deficit disorder); Diabetes mellitus; Lithium use; Abnormal coordination; Malignant neoplasm of female breast (HCC); Ovarian cancer (HCC); Dizziness; Problem with voice production; Weakness of voice; Pathologic change of voice; and Hypersensitivity reaction on her problem list.    is allergic to doxil and pollen extract.    Medication List       This list is accurate as of: 05/20/15  1:57 PM.  Always use your most recent med list.               Acetyl L-Carnitine 500 MG Caps  Take 500 mg by mouth QID.     ALPRAZolam 1 MG tablet  Commonly known as:  XANAX  Take 1 mg by mouth at bedtime as needed for anxiety.     betamethasone dipropionate 0.05 % cream  Commonly known as:  DIPROLENE  Apply topically 2 (two) times daily.     calcium carbonate 500 MG chewable tablet  Commonly known as:  TUMS - dosed in mg elemental calcium  Chew 1 tablet by mouth as needed for indigestion or heartburn.     DEPAKOTE 250 MG DR tablet  Generic drug:  divalproex  Take 250 mg by mouth 2 (two) times daily. 500mg in the morning and 1500 mg in the evening     gabapentin 100 MG capsule  Commonly known as:    NEURONTIN  TAKE ONE CAPSULE BY MOUTH THREE TIMES A DAY     lamoTRIgine 150 MG tablet  Commonly known as:  LAMICTAL  Take 150 mg by mouth at bedtime.     levothyroxine 100 MCG tablet  Commonly known as:  SYNTHROID, LEVOTHROID  Take 100 mcg by mouth daily before breakfast.     lidocaine-prilocaine cream  Commonly known as:  EMLA  Apply topically as needed. Apply to port with every chemotherapy.     loperamide 2 MG tablet  Commonly known as:  IMODIUM A-D  Take 2 mg by mouth as needed for diarrhea or loose stools.      ondansetron 8 MG disintegrating tablet  Commonly known as:  ZOFRAN ODT  Take 1 tablet (8 mg total) by mouth every 8 (eight) hours as needed for nausea or vomiting.     ondansetron 8 MG tablet  Commonly known as:  ZOFRAN  TAKE 1 TABLET BY MOUTH EVERY 8 HOURS AS NEEDED FOR NAUSEA AND VOMITING     predniSONE 10 MG tablet  Commonly known as:  DELTASONE  Take 4 tablets (40 mg total) by mouth daily.     rOPINIRole 1 MG tablet  Commonly known as:  REQUIP  Take 1 mg by mouth at bedtime.         PHYSICAL EXAMINATION  Oncology Vitals 05/20/2015 05/20/2015  Pulse 89 87  Resp 14 16  SpO2 97 98   BP Readings from Last 2 Encounters:  05/20/15 118/81  05/20/15 94/56    Physical Exam  Constitutional: She is oriented to person, place, and time and well-developed, well-nourished, and in no distress.  HENT:  Head: Normocephalic and atraumatic.  Eyes: Conjunctivae and EOM are normal. Pupils are equal, round, and reactive to light. Right eye exhibits no discharge. Left eye exhibits no discharge. No scleral icterus.  Neck: Normal range of motion. Neck supple. No JVD present. No tracheal deviation present. No thyromegaly present.  Cardiovascular: Normal rate, regular rhythm, normal heart sounds and intact distal pulses.   Pulmonary/Chest: Effort normal and breath sounds normal. No stridor. No respiratory distress. She has no wheezes. She has no rales. She exhibits no tenderness.  Abdominal: Soft. Bowel sounds are normal. She exhibits no distension and no mass. There is no tenderness. There is no rebound and no guarding.  Musculoskeletal: Normal range of motion.  Lymphadenopathy:    She has no cervical adenopathy.  Neurological: She is alert and oriented to person, place, and time.  Skin: No rash noted.  Patient has no rash; but did have some flushing to her chest and neck.  Psychiatric:  Patient appears anxious.  Nursing note and vitals reviewed.   LABORATORY DATA:. Appointment on  05/20/2015  Component Date Value Ref Range Status  . WBC 05/20/2015 3.8* 3.9 - 10.3 10e3/uL Final  . NEUT# 05/20/2015 1.8  1.5 - 6.5 10e3/uL Final  . HGB 05/20/2015 11.7  11.6 - 15.9 g/dL Final  . HCT 05/20/2015 37.6  34.8 - 46.6 % Final  . Platelets 05/20/2015 73* 145 - 400 10e3/uL Final  . MCV 05/20/2015 93.5  79.5 - 101.0 fL Final  . MCH 05/20/2015 29.1  25.1 - 34.0 pg Final  . MCHC 05/20/2015 31.1* 31.5 - 36.0 g/dL Final  . RBC 05/20/2015 4.02  3.70 - 5.45 10e6/uL Final  . RDW 05/20/2015 15.0* 11.2 - 14.5 % Final  . lymph# 05/20/2015 1.3  0.9 - 3.3 10e3/uL Final  . MONO# 05/20/2015 0.5  0.1 - 0.9   10e3/uL Final  . Eosinophils Absolute 05/20/2015 0.2  0.0 - 0.5 10e3/uL Final  . Basophils Absolute 05/20/2015 0.0  0.0 - 0.1 10e3/uL Final  . NEUT% 05/20/2015 47.8  38.4 - 76.8 % Final  . LYMPH% 05/20/2015 33.9  14.0 - 49.7 % Final  . MONO% 05/20/2015 13.1  0.0 - 14.0 % Final  . EOS% 05/20/2015 4.7  0.0 - 7.0 % Final  . BASO% 05/20/2015 0.5  0.0 - 2.0 % Final  . nRBC 05/20/2015 0  0 - 0 % Final  . Cancer Antigen (CA) 125 05/20/2015 92.9* 0.0 - 38.1 U/mL Final   Roche ECLIA methodology  . Sodium 05/20/2015 139  136 - 145 mEq/L Final  . Potassium 05/20/2015 4.7  3.5 - 5.1 mEq/L Final  . Chloride 05/20/2015 107  98 - 109 mEq/L Final  . CO2 05/20/2015 26  22 - 29 mEq/L Final  . Glucose 05/20/2015 89  70 - 140 mg/dl Final   Glucose reference range is for nonfasting patients. Fasting glucose reference range is 70- 100.  . BUN 05/20/2015 15.8  7.0 - 26.0 mg/dL Final  . Creatinine 05/20/2015 0.8  0.6 - 1.1 mg/dL Final  . Total Bilirubin 05/20/2015 <0.30  0.20 - 1.20 mg/dL Final  . Alkaline Phosphatase 05/20/2015 78  40 - 150 U/L Final  . AST 05/20/2015 24  5 - 34 U/L Final  . ALT 05/20/2015 13  0 - 55 U/L Final  . Total Protein 05/20/2015 8.1  6.4 - 8.3 g/dL Final  . Albumin 05/20/2015 3.3* 3.5 - 5.0 g/dL Final  . Calcium 05/20/2015 10.0  8.4 - 10.4 mg/dL Final  . Anion Gap 05/20/2015 6   3 - 11 mEq/L Final  . EGFR 05/20/2015 87* >90 ml/min/1.73 m2 Final   eGFR is calculated using the CKD-EPI Creatinine Equation (2009)    RADIOGRAPHIC STUDIES: No results found.  ASSESSMENT/PLAN:    Ovarian cancer (HCC) Patient's last restaging scan obtained on 05/05/2015 revealed progression.  Patient presented to the cancer Center today to receive her first Doxil chemotherapy infusion.  However, patient experienced a hypersensitivity reaction; and was unable to complete the chemotherapy.  See further note for details.  Patient is scheduled for a flush only on 06/02/2015.  She was scheduled for flush, lab, visit, and her next cycle of chemotherapy on 06/17/2015.  Will need to review all with Dr. Shadad to determine new treatment plan for the patient.  Hypersensitivity reaction Patient's last restaging scan obtained on 05/05/2015 revealed progression.  Patient presented to the cancer Center today to receive her first Doxil chemotherapy infusion.   Patient initially experienced some flushing only; but then proceeded to develop hypotension, and near-syncope as well.  Chemotherapy infusion was held; and patient received nitroglycerin, Pepcid, and Solu-Medrol per protocol.  Patient's blood pressure continued to trend downward; and rapid response team was called.  Patient was transported to the emergency department for further evaluation and management.  See emergency department record for further details.  Patient stated understanding of all instructions; and was in agreement with this plan of care. The patient knows to call the clinic with any problems, questions or concerns.   Review/collaboration with Dr. Shadad regarding all aspects of patient's visit today.   Total time spent with patient was 40 minutes;  with greater than 75 percent of that time spent in face to face counseling regarding patient's symptoms,  and coordination of care and follow up.  Disclaimer:This dictation was  prepared with Dragon/digital dictation along   with [employer]. Any transcriptional errors that result from this process are unintentional.  Drue Second, NP 05/23/2015

## 2015-05-23 NOTE — Assessment & Plan Note (Signed)
Patient's last restaging scan obtained on 05/05/2015 revealed progression.  Patient presented to the [facility] today to receive her first Doxil chemotherapy infusion.   Patient initially experienced some flushing only; but then proceeded to develop hypotension, and near-syncope as well.  Chemotherapy infusion was held; and patient received nitroglycerin, Pepcid, and Solu-Medrol per protocol.  Patient's blood pressure continued to trend downward; and rapid response team was called.  Patient was transported to the emergency department for further evaluation and management.  See emergency department record for further details.

## 2015-05-24 ENCOUNTER — Telehealth: Payer: Self-pay | Admitting: *Deleted

## 2015-05-24 NOTE — Telephone Encounter (Signed)
Received voice message from patient stating, "I had a reaction last week to Doxil and was taken to the ER. What is my next chemotherapy drug to take? I come back 06/17/15 for lab/flush/MD/chemo." Return number is [phone].

## 2015-05-24 NOTE — Telephone Encounter (Signed)
As noted below by Dr. Alen Blew, I informed patient that Dr. Alen Blew made a referral to Gyn-Onc for another opinion regarding next option of therapy. Patient verbalized understanding.

## 2015-05-24 NOTE — Addendum Note (Signed)
Addended by: Wyatt Portela on: 05/24/2015 12:47 PM   Modules accepted: Orders

## 2015-05-24 NOTE — Telephone Encounter (Signed)
Please let her know that I have referred her to Gyn-Onc for another opinion regarding her next option of therapy. She has seen Dr. Aldean Ast in the past.

## 2015-05-25 ENCOUNTER — Ambulatory Visit: Payer: PPO | Attending: Neurology | Admitting: Physical Therapy

## 2015-05-25 ENCOUNTER — Other Ambulatory Visit (INDEPENDENT_AMBULATORY_CARE_PROVIDER_SITE_OTHER): Payer: Self-pay

## 2015-05-25 DIAGNOSIS — M542 Cervicalgia: Secondary | ICD-10-CM | POA: Insufficient documentation

## 2015-05-25 DIAGNOSIS — R293 Abnormal posture: Secondary | ICD-10-CM | POA: Diagnosis not present

## 2015-05-25 DIAGNOSIS — Z0289 Encounter for other administrative examinations: Secondary | ICD-10-CM

## 2015-05-25 DIAGNOSIS — M436 Torticollis: Secondary | ICD-10-CM | POA: Diagnosis not present

## 2015-05-25 DIAGNOSIS — F3175 Bipolar disorder, in partial remission, most recent episode depressed: Secondary | ICD-10-CM | POA: Diagnosis not present

## 2015-05-25 DIAGNOSIS — R42 Dizziness and giddiness: Secondary | ICD-10-CM

## 2015-05-25 NOTE — Therapy (Addendum)
[facility] [address] [place], [place], [address] Phone: [phone]   Fax:  [phone]  Physical Therapy Treatment  Patient Details  Name: [name] MRN: [id number] Date of Birth: 1956/01/16 Referring Provider: Sarina Ill, MD  Encounter Date: 05/25/2015      PT End of Session - 05/25/15 1638    Visit Number 11   Number of Visits 15  requesting additional 4 visits   Date for PT Re-Evaluation 07/19/15   Authorization Type Medicare - G Codes every 10 visits   PT Start Time 0805   PT Stop Time 0846   PT Time Calculation (min) 41 min   Activity Tolerance Patient tolerated treatment well;No increased pain   Behavior During Therapy [facility] for tasks assessed/performed      Past Medical History  Diagnosis Date  . Premature menopause   . Osteopenia 03/2009    t score -2.1 FRAX 4.6/0.4  . Hypothyroidism   . Depression   . Anxiety   . Hyperlipidemia   . Bipolar disorder ([place])   . ADD (attention deficit disorder)   . Hematochezia   . Allergy     seasonal  . Maintenance chemotherapy     Pt has chemo every 3 weeks (on Friday)  . Sleep apnea     mild  . Breast cancer ([place]) 1989    right breast  . Diabetes mellitus     pt denies DM noe meds    Past Surgical History  Procedure Laterality Date  . Breast surgery  1989    RIGHT LUMPECTOMY, RADIATION AND CHEMO  . Hysteroscopy  2011    Polyp  . Pelvic laparoscopy/ hysteroscopy  1996  . Foot surgery  2013    BILATERAL     There were no vitals filed for this visit.  Visit Diagnosis:  Cervicalgia - Plan: PT plan of care cert/re-cert  Abnormal posture - Plan: PT plan of care cert/re-cert      Subjective Assessment - 05/25/15 0811    Subjective "I had an allergic reaction to the new chemotherapy drug on Friday. I went to the ED. I was having trouble breathing. It's been really bad." Pt reports increased stiffness and pain in neck today. Pt does feel as  though walking is better. Also, dizziness no longer present.   Pertinent History PMH significant: omentum cancer (active; chemo every 3 weeks); h/o breast cancer s/p R lumpectomy (1989); bipolar disorder, anxiety, depression, OCD, ADD   Patient Stated Goals 4/5: To make my neck less stiff and painful; to be able to drive without pain when I turn my head."   Currently in Pain? Yes   Pain Score 7   4/10 pain post-session   Pain Location Neck   Pain Orientation Right;Lower   Pain Descriptors / Indicators Dull   Pain Type Chronic pain   Pain Onset More than a month ago   Pain Frequency Constant   Aggravating Factors  turning head (both sides)   Pain Relieving Factors patient unsure   Multiple Pain Sites No            [facility] PT Assessment - 05/25/15 0001    AROM   Overall AROM  Deficits   AROM Assessment Site Cervical   Cervical - Right Side Bend 18   Cervical - Left Side Bend 25   Cervical - Right Rotation 36   Cervical - Left Rotation 32  [place] Adult PT Treatment/Exercise - 05/25/15 0001    Ambulation/Gait   Ambulation/Gait Yes   Ambulation/Gait Assistance 6: Modified independent (Device/Increase time)   Ambulation Distance (Feet) 700 Feet   Assistive device None   Gait Pattern Step-through pattern;Decreased stride length  avoids head turns during gait; turns en bloc   Ambulation Surface Level;Unlevel;Indoor;Outdoor;Paved   Ramp 6: Modified independent (Device)   Curb 6: Modified independent (Device/increase time)   Posture/Postural Control   Posture/Postural Control Postural limitations   Postural Limitations Increased thoracic kyphosis;Posterior pelvic tilt;Flexed trunk   Manual Therapy   Manual Therapy Soft tissue mobilization;Myofascial release   Manual therapy comments STM/MFR at R upper trapezius x8 minutes where myofascial adhesion noted to be TPP.  With pt seated in standard chair, performed contract-relax PNF (4 x3-sec holds each)  alternating with prolonged passive stretch (4 x30-sec holds) of cervical spine lateral flexion and rotation, bilaterally.                PT Education - 05/25/15 1631    Education provided Yes   Education Details PT goals, findings, progress, and POC. Extending POC to additional 1x/week for up to 4 weeks to focus on neck pain/stiffness.    Person(s) Educated Patient   Methods Explanation   Comprehension Verbalized understanding          PT Short Term Goals - 05/25/15 1611    PT SHORT TERM GOAL #1   Title Pt will perform initial HEP with mod I using paper handout to maximize functional gains made in PT.  (Target date: 04/18/15)   Baseline --   Status Achieved   PT SHORT TERM GOAL #2   Title Rule out orthostatic hypotension as contributing factor to dizziness.  (Target date: 04/18/15)   Baseline Orthostatic hypotension ruled out 2/6.   Status Achieved   PT SHORT TERM GOAL #3   Title Pt will improve active cervical spine lateral flexion to 15 degrees bilaterally to progress toward normal neck ROM.  (Target date: 04/18/15)   Baseline Met 4/5.   Status Achieved   PT SHORT TERM GOAL #4   Title Pt will improve active cervical spine rotation to 40 degrees bilaterally progress toward normal neck ROM.   (Target date: 04/18/15)   Baseline See LTG.   Status Deferred  See LTG's.   PT SHORT TERM GOAL #5   Title Pt will improve DGI score from 13/24 to 16/24 to indicate improved dynamic gait stability.  (Target date: 04/18/15)   Baseline 3/1: DGI = 16/24   Status Achieved   PT SHORT TERM GOAL #6   Title Pt will improve gait speed from 2.42 ft/sec to > 2.62 ft/sec to indicate functional status of community ambulator.  (Target date: 04/18/15)   Baseline 3/1: gait velocity = 3.29 ft/sec   Status Achieved           PT Long Term Goals - 05/25/15 0817    PT LONG TERM GOAL #1   Title Pt will improve active cervical spine lateral flexion to 15 degrees bilaterally to progress toward normal  neck ROM. (Target date: 05/16/15)   Baseline Met 4/5.   Status Achieved   PT LONG TERM GOAL #2   Title Pt will improve active cervical spine rotation to 40 degrees bilaterally to increase safety with driving.   (Modified Target date: 06/22/14)   Baseline 4/5:  36 degrees to R, 32 degrees to L.    REVISED 4/4 to 40 degrees to increase safety with driving.  Status Revised   PT LONG TERM GOAL #3   Title Pt will improve DGI score from 13/24 to > / = 20/24 to indicate decreased fall risk.    (Target date: 05/16/15)   Baseline Met 3/22.   Status Achieved   PT LONG TERM GOAL #4   Title Pt will decrease DHI score from 72 to < / = 54 to indicate significant decrease in pt-perceived disability due to dizziness.   (Modified Target date: 06/22/14)   Status On-going   PT LONG TERM GOAL #5   Title Pt will independently negotiate 2 stairs without rails to indicate pt abiliy to safely use primary home entrance.    (Target date: 05/16/15)   Baseline 4/5: Pt reports she is now able to safely hold onto rail that leads to gait on L rail (ascending). Pt mod I to negotiate 4 stairs with single L rail today,   Status Achieved   PT LONG TERM GOAL #6   Title Pt will independently ambulate > 500' over unlevel, paved surfaces and negotiate standard ramp and curb step to indicate safety with community mobility.    (Target date: 05/16/15)   Baseline Met 4/5.   Status Achieved   PT LONG TERM GOAL #7   Title Pt will consistently perform supine > sit independently to indicate increased independence with bed mobility.   (Target date: 05/16/15)   Baseline Met 3/22.   Status Achieved   PT LONG TERM GOAL #8   Title Decrease NDI score from 38% to 28% to indicate decreased neck-related disability.    (Target date: 06/22/14)   Status New               Plan - 06-15-15 1640    Clinical Impression Statement Pt has met applicable STG's and met all LTG's pertaining to balance and gait stability. Pt no longer experiencing  dizziness is mod I for all functional mobility. Pt continues to demonstrate significant limitations in cervical spine ROM and NDI indicates increased neck-related disability. Pt did respond well to manual therapy and stretching during this sessio, as cervical spine pain decreased from 7/10 to 4/10 within session. Pt will benefit from skilled outpatient PT 1x/week for up to 4 weeks to address cervical spine stiffness, pain, and postural impairments. Pt in full agreement with POC.   Pt will benefit from skilled therapeutic intervention in order to improve on the following deficits Pain;Postural dysfunction;Decreased range of motion;Hypomobility;Impaired flexibility;Increased fascial restricitons;Decreased strength   Rehab Potential Good   Clinical Impairments Affecting Rehab Potential high insurance copay; currently undergoing chemotherapy   PT Frequency Other (comment)  2x/week for 4 weeks then 1x/week for 4 weeks   PT Duration --  see above   PT Treatment/Interventions ADLs/Self Care Home Management;Vestibular;Manual techniques;Patient/family education;Neuromuscular re-education;Gait training;Stair training;Functional mobility training;Therapeutic activities;Therapeutic exercise;Balance training;DME Instruction   PT Next Visit Plan Manual therapy to increase pain-free cervical spine AROM. Review HEP.   Consulted and Agree with Plan of Care Patient          G-Codes - Jun 15, 2015 1610    Functional Assessment Tool Used Neck Disability Index = 38%   Functional Limitation Self care   Self Care Current Status (K8445) At least 20 percent but less than 40 percent impaired, limited or restricted   Self Care Goal Status (Z7693) At least 20 percent but less than 40 percent impaired, limited or restricted      Problem List Patient Active Problem List   Diagnosis Date Noted  . Hypersensitivity  reaction 05/23/2015  . Problem with voice production 04/26/2015    Class: Acute  . Weakness of voice  04/26/2015    Class: Acute  . Pathologic change of voice 04/26/2015    Class: Acute  . Dizziness 03/29/2015  . Ovarian cancer ([place]) 08/13/2013  . Malignant neoplasm of female breast ([place]) 09/19/2012  . Lithium use 09/17/2011  . Abnormal coordination 09/17/2011  . Premature menopause   . Depression   . Anxiety   . Bipolar disorder ([place])   . ADD (attention deficit disorder)   . Diabetes mellitus   . NONSPECIFIC ABNORMAL ELECTROCARDIOGRAM 11/04/2009  . ALLERGIC RHINITIS 09/01/2009  . INCONTINENCE 09/01/2009  . DIAB W/O COMP TYPE II/UNS NOT STATED UNCNTRL 07/28/2008  . NONSPEC ELEVATION OF LEVELS OF TRANSAMINASE/LDH 07/28/2008  . MOTOR RESTLESSNESS 02/19/2008  . Internal hemorrhoids without mention of complication 68/86/4847  . ESOPHAGEAL STRICTURE 09/15/2007  . DYSPHAGIA 09/15/2007  . OBSTRUCTIVE SLEEP APNEA 07/21/2007  . HYPERLIPIDEMIA 07/07/2007  . Osteopenia 02/20/2007  . HYPOTHYROIDISM 10/01/2006  . BREAST CANCER, HX OF 08/12/2006    [name], PT, DPT [facility] [address] [place], [place], [address] Phone: [phone]   Fax:  [phone] 05/25/2015, 4:48 PM  Name: [name] MRN: [id number] Date of Birth: [birth year]

## 2015-05-26 DIAGNOSIS — T7840XA Allergy, unspecified, initial encounter: Secondary | ICD-10-CM | POA: Diagnosis not present

## 2015-05-26 DIAGNOSIS — R030 Elevated blood-pressure reading, without diagnosis of hypertension: Secondary | ICD-10-CM | POA: Diagnosis not present

## 2015-05-26 DIAGNOSIS — E039 Hypothyroidism, unspecified: Secondary | ICD-10-CM | POA: Diagnosis not present

## 2015-05-27 ENCOUNTER — Encounter: Payer: Self-pay | Admitting: Oncology

## 2015-05-27 LAB — COMPREHENSIVE METABOLIC PANEL
ALBUMIN: 3.8 g/dL (ref 3.5–5.5)
ALT: 11 IU/L (ref 0–32)
AST: 23 IU/L (ref 0–40)
Albumin/Globulin Ratio: 1.1 — ABNORMAL LOW (ref 1.2–2.2)
Alkaline Phosphatase: 58 IU/L (ref 39–117)
BILIRUBIN TOTAL: 0.2 mg/dL (ref 0.0–1.2)
BUN/Creatinine Ratio: 23 (ref 9–23)
BUN: 19 mg/dL (ref 6–24)
CHLORIDE: 102 mmol/L (ref 96–106)
CO2: 26 mmol/L (ref 18–29)
CREATININE: 0.82 mg/dL (ref 0.57–1.00)
Calcium: 8.7 mg/dL (ref 8.7–10.2)
GFR calc Af Amer: 92 mL/min/{1.73_m2} (ref >59)
GFR calc non Af Amer: 80 mL/min/{1.73_m2} (ref >59)
GLOBULIN, TOTAL: 3.6 g/dL (ref 1.5–4.5)
GLUCOSE: 79 mg/dL (ref 65–99)
Potassium: 4.4 mmol/L (ref 3.5–5.2)
Sodium: 144 mmol/L (ref 134–144)
TOTAL PROTEIN: 7.4 g/dL (ref 6.0–8.5)

## 2015-05-27 LAB — CBC WITH DIFFERENTIAL/PLATELET
BASOS: 0 %
Basophils Absolute: 0 10*3/uL (ref 0.0–0.2)
EOS (ABSOLUTE): 0.1 10*3/uL (ref 0.0–0.4)
EOS: 2 %
HEMATOCRIT: 36.7 % (ref 34.0–46.6)
HEMOGLOBIN: 12 g/dL (ref 11.1–15.9)
Immature Grans (Abs): 0.1 10*3/uL (ref 0.0–0.1)
Immature Granulocytes: 1 %
Lymphocytes Absolute: 1.6 10*3/uL (ref 0.7–3.1)
Lymphs: 41 %
MCH: 29 pg (ref 26.6–33.0)
MCHC: 32.7 g/dL (ref 31.5–35.7)
MCV: 89 fL (ref 79–97)
MONOCYTES: 12 %
Monocytes Absolute: 0.5 10*3/uL (ref 0.1–0.9)
NEUTROS ABS: 1.8 10*3/uL (ref 1.4–7.0)
Neutrophils: 44 %
Platelets: 94 10*3/uL — CL (ref 150–379)
RBC: 4.14 x10E6/uL (ref 3.77–5.28)
RDW: 15.4 % (ref 12.3–15.4)
WBC: 3.9 10*3/uL (ref 3.4–10.8)

## 2015-05-27 LAB — VALPROIC ACID LEVEL: Valproic Acid Lvl: 80 ug/mL (ref 50–100)

## 2015-05-27 LAB — LAMOTRIGINE LEVEL: Lamotrigine Lvl: 7.7 ug/mL (ref 2.0–20.0)

## 2015-05-28 NOTE — Addendum Note (Signed)
Addended by: Billie Ruddy A on: 05/28/2015 02:19 PM   Modules accepted: Orders

## 2015-05-30 ENCOUNTER — Telehealth: Payer: Self-pay | Admitting: *Deleted

## 2015-05-30 DIAGNOSIS — F3131 Bipolar disorder, current episode depressed, mild: Secondary | ICD-10-CM | POA: Diagnosis not present

## 2015-05-30 NOTE — Telephone Encounter (Signed)
-----   Message from Melvenia Beam, MD sent at 05/27/2015  3:31 PM EDT ----- Her platelets are 49 which is very low. Otherwise everything looks good. Does she need [place] to forward them anywhere?

## 2015-05-30 NOTE — Telephone Encounter (Signed)
Sent lab results per pt request. Received fax confirmation.

## 2015-05-30 NOTE — Telephone Encounter (Signed)
LVM for pt about results per Dr Jaynee Eagles note. Gave [facility] phone number for her to call back and let us know if she would like results forwarded to anyone. Advised she should f/u with her PCP.

## 2015-05-30 NOTE — Telephone Encounter (Signed)
Patient called back to advise, would like results sent to [facility] Fax# [phone].

## 2015-05-31 ENCOUNTER — Encounter: Payer: Self-pay | Admitting: Oncology

## 2015-06-01 ENCOUNTER — Telehealth: Payer: Self-pay | Admitting: *Deleted

## 2015-06-01 NOTE — Telephone Encounter (Signed)
Spoke with craig, patient's husband. Patient is scheduled to see dr Denman George. D/t Dr Aldean Ast is booked so far ahead. If patient insists on seeing dr Aldean Ast, she may call schedulers and re-schedule for a later date. Cecilie Lowers verbalized understanding.

## 2015-06-02 ENCOUNTER — Ambulatory Visit (HOSPITAL_BASED_OUTPATIENT_CLINIC_OR_DEPARTMENT_OTHER): Payer: PPO | Admitting: Genetic Counselor

## 2015-06-02 ENCOUNTER — Other Ambulatory Visit: Payer: PPO

## 2015-06-02 ENCOUNTER — Encounter: Payer: Self-pay | Admitting: Genetic Counselor

## 2015-06-02 DIAGNOSIS — Z809 Family history of malignant neoplasm, unspecified: Secondary | ICD-10-CM

## 2015-06-02 DIAGNOSIS — C569 Malignant neoplasm of unspecified ovary: Secondary | ICD-10-CM

## 2015-06-02 DIAGNOSIS — Z800 Family history of malignant neoplasm of digestive organs: Secondary | ICD-10-CM | POA: Diagnosis not present

## 2015-06-02 DIAGNOSIS — Z853 Personal history of malignant neoplasm of breast: Secondary | ICD-10-CM

## 2015-06-02 DIAGNOSIS — Z803 Family history of malignant neoplasm of breast: Secondary | ICD-10-CM | POA: Diagnosis not present

## 2015-06-02 NOTE — Progress Notes (Signed)
REFERRING PROVIDER: Zola Button, MD  PRIMARY PROVIDER:  Carlos Levering, PA-C  PRIMARY REASON FOR VISIT:  1. Ovarian cancer, unspecified laterality ([place])   2. History of breast cancer   3. Family history of breast cancer in female   47. Family history of liver cancer   5. Family history of cancer      HISTORY OF PRESENT ILLNESS:   Ms. Ruppert, a 60 y.o. female, was seen for a [place] cancer genetics consultation at the request of Dr. Alen Blew due to a personal history of early-onset breast cancer and omental cancer and family history of breast and other cancers.  Ms. Faulks presents to clinic today with her husband to discuss the possibility of a hereditary predisposition to cancer, genetic testing, and to further clarify her future cancer risks, as well as potential cancer risks for family members.   In July 2014, at the age of 57, Ms. Lama was diagnosed with cancer of the omentum. This is currently being treated with chemotherapy.  Ms. Goehring was also diagnosed with right breast cancer at the age of 69.  She describes this breast cancer as "hormone positive" and she underwent a right breast lumpectomy, radiation, and chemotherapy for treatment of this cancer.      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  First live birth at age - no children.  OCP use for approximately 20-25 years.  Ovaries intact: no.  Hysterectomy: no.  Menopausal status: postmenopausal.  HRT use: 0 years. Colonoscopy: yes; normal, save for moderate diverticulosis; most recent was in 11/2014. Mammogram within the last year: yes. Number of breast biopsies: 2. Up to date with pelvic exams:  n/a. 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 Premature menopause   . Osteopenia 03/2009    t score -2.1 FRAX 4.6/0.4  . Hypothyroidism   . Depression   . Anxiety   . Hyperlipidemia   . Bipolar disorder ([place])   . ADD (attention deficit disorder)   . Hematochezia   . Allergy     seasonal  . Maintenance chemotherapy     Pt has chemo every 3 weeks (on Friday)  . Sleep apnea     mild  . Breast cancer ([place]) 1989    right breast  . Diabetes mellitus     pt denies DM noe meds    Past Surgical History  Procedure Laterality Date  . Breast surgery  1989    RIGHT LUMPECTOMY, RADIATION AND CHEMO  . Hysteroscopy  2011    Polyp  . Pelvic laparoscopy/ hysteroscopy  1996  . Foot surgery  2013    BILATERAL     Social History   Social History  . Marital Status: Married    Spouse Name: [name]  . Number of Children: 0  . Years of Education: 14   Occupational History  . unemployed    Social History Main Topics  . Smoking status: Never Smoker   . Smokeless tobacco: Never Used  . Alcohol Use: 0.0 oz/week    0 Standard drinks or equivalent per week     Comment: very rarely -  maybe 1 glass wine every 3 mos  . Drug Use: No  . Sexual Activity: No   Other Topics Concern  . Not on file   Social History Narrative   Lives at home with husband.    Caffeine use:  Tea/soda occass     FAMILY HISTORY:  We obtained a detailed, 4-generation family history.  Significant diagnoses are listed below: Family History  Problem Relation Age of Onset  . Breast cancer Maternal Aunt 29  . Diabetes Maternal Grandmother   . Heart disease Maternal Grandmother   . Heart disease Maternal Grandfather   . Hypertension Paternal Grandfather   . Heart Problems Paternal Grandfather   . Breast cancer Maternal Aunt     dx. early 64s; had negative GT approx 10 years ago  . Breast cancer  Maternal Aunt     dx. 69s with recurrence  . Ovarian cancer Neg Hx   . Colon cancer Neg Hx   . Allergies Father   . Heart disease Mother   . Other Mother     hx of hysterectomy   . Liver cancer Cousin 28    +EtOH  . Cancer Cousin     paternal 1st cousin, once-removed dx. NOS cancer (maybe ovarian) in late 30s-40s  . Cancer Cousin     female paternal 2nd cousin d. of NOS cancer in her 20s-early 56s    Ms. Pontarelli has one full brother and sister, ages 95 and 39.  Neither of her siblings have had cancer.  Each of her siblings has one son and one daughter, and none of these children have ever had cancer.  Ms. Wishon mother is currently 42 and has not had cancer; she does have a history of a hysterectomy "a long time ago" for an unspecified reason.  Ms. Toohey father is currently 78 and has never had cancer.    Ms. Bottoms mother has four full sisters and three full brothers.  One sister died of breast cancer at the age of 62; this was first diagnosed at age 74. Two other sisters have also had breast cancer--one diagnosed in her 67s, the other diagnosed in her 27s with recurrence.  The sister diagnosed in her 45s reportedly had negative genetic testing approximately 10 years ago.  One sister is currently in her late 66s and has not had cancer.  The three brothers are all in their 54s and have never had cancer.  One maternal first cousin was diagnosed with liver cancer at age 19 and passed away at age 63.  He was a heavy drinker.  Ms. Gabbert maternal grandmother died of heart problems and diabetes at 78.  She was an only child.  Ms. Winward maternal grandfather died of heart problems in his late 63s.  Ms. Mahone has no information for any maternal great aunts/uncles or great grandparents.  Ms. Holmes father has one full sister who is currently 62 and who has never had cancer.  She has one daughter and two sons who have also never had cancer.  Ms. Formisano paternal grandmother died at  39.  Her grandfather died of heart problems and hypertension in his early 77s.  He had a brother who had a daughter (a paternal first cousin once-removed to Ms. Luce) who died of an unspecified (perhaps ovarian) cancer in her late 32s-40s.  Another sister of his had a granddaughter who died of an unspecified type of cancer in her 20s-early 31s.   Patient's maternal ancestors are of [place], [place],  and [place] descent, and paternal ancestors are of [place] descent. There is no reported Ashkenazi Jewish ancestry. There is no known consanguinity.  GENETIC COUNSELING ASSESSMENT: KHRISTEN CHEYNEY is a 60 y.o. female with a personal and family history of cancer which is somewhat suggestive of hereditary breast and ovarian cancer syndrome and predisposition to cancer. We, therefore, discussed and recommended the following at today's visit.   DISCUSSION: We reviewed the characteristics, features and inheritance patterns of hereditary cancer syndromes, particularly those caused by mutations in the BRCA1/2 and Lynch syndrome genes. We also discussed genetic testing, including the appropriate family members to test, the process of testing, insurance coverage and turn-around-time for results. We discussed the implications of a negative, positive and/or variant of uncertain significant result. We recommended Ms. Dimare pursue genetic testing for the 20-gene Breast/Ovarian Cancer Panel through [facility].  The Breast/Ovarian Cancer Panel offered by [facility] Hope Pigeon, MD) includes sequencing and deletion/duplication analysis for the following 19 genes:  ATM, BARD1, BRCA1, BRCA2, BRIP1, CDH1, CHEK2, FANCC, MLH1, MSH2, MSH6, NBN, PALB2, PMS2, PTEN, RAD51C, RAD51D, TP53, and XRCC2.  This panel also includes deletion/duplication analysis (without sequencing) for one gene, EPCAM.  Based on Ms. [facility] personal and family history of cancer, she meets medical criteria for genetic testing. Despite that  she meets criteria, she may still have an out of pocket cost. We discussed that if her out of pocket cost for testing is over $100, the laboratory will call and confirm whether she wants to proceed with testing.  If the out of pocket cost of testing is less than $100 she will be billed by the genetic testing laboratory.   PLAN: After considering the risks, benefits, and limitations, Ms. Morrish  provided informed consent to pursue genetic testing and the blood sample was sent to [facility] for analysis of the 20-gene Breast/Ovarian Cancer Panel. Results should be available within approximately 2-3 weeks' time, at which point they will be disclosed by telephone to Ms. Radich, as will any additional recommendations warranted by these results. Ms. Stoffer will receive a summary of her genetic counseling visit and a copy of her results once available. This information will also be available in Epic. We encouraged Ms. Stites to remain in contact with cancer genetics annually so that we can continuously update the family history and inform her of any changes in cancer genetics and testing that may be of benefit for her family. Ms. Christley questions were answered to her satisfaction today. Our contact information was provided should additional questions or concerns arise.  Thank you for the referral and allowing [place] to share in the care of your patient.   Jeanine Luz, MS, [facility] Certified Genetic Counselor [email or web address] Phone: [phone]  The patient was seen for a total of 60 minutes in face-to-face genetic counseling.  This patient was discussed with Drs. Magrinat, Lindi Adie and/or Burr Medico who agrees with the above.    _______________________________________________________________________ For Office Staff:  Number of people involved in session: 3 Was an Intern/ student involved with case: yes

## 2015-06-03 ENCOUNTER — Ambulatory Visit: Payer: PPO

## 2015-06-09 ENCOUNTER — Encounter: Payer: Self-pay | Admitting: *Deleted

## 2015-06-10 ENCOUNTER — Ambulatory Visit: Payer: PPO | Admitting: Physical Therapy

## 2015-06-10 DIAGNOSIS — R293 Abnormal posture: Secondary | ICD-10-CM

## 2015-06-10 DIAGNOSIS — M542 Cervicalgia: Secondary | ICD-10-CM

## 2015-06-10 NOTE — Therapy (Signed)
[facility] [address] [place], [place], [address] Phone: [phone]   Fax:  [phone]  Physical Therapy Treatment  Patient Details  Name: [name] MRN: [id number] Date of Birth: 1956-01-31 Referring Provider: Sarina Ill, MD  Encounter Date: 06/10/2015      PT End of Session - 06/10/15 1256    Visit Number 12   Number of Visits 15   Date for PT Re-Evaluation 07/19/15   Authorization Type Medicare - G Codes every 10 visits   PT Start Time 9678   PT Stop Time 1019   PT Time Calculation (min) 45 min   Activity Tolerance Patient tolerated treatment well;No increased pain   Behavior During Therapy [facility] for tasks assessed/performed      Past Medical History  Diagnosis Date  . Premature menopause   . Osteopenia 03/2009    t score -2.1 FRAX 4.6/0.4  . Hypothyroidism   . Depression   . Anxiety   . Hyperlipidemia   . Bipolar disorder ([place])   . ADD (attention deficit disorder)   . Hematochezia   . Allergy     seasonal  . Maintenance chemotherapy     Pt has chemo every 3 weeks (on Friday)  . Sleep apnea     mild  . Breast cancer ([place]) 1989    right breast  . Diabetes mellitus     pt denies DM noe meds    Past Surgical History  Procedure Laterality Date  . Breast surgery  1989    RIGHT LUMPECTOMY, RADIATION AND CHEMO  . Hysteroscopy  2011    Polyp  . Pelvic laparoscopy/ hysteroscopy  1996  . Foot surgery  2013    BILATERAL     There were no vitals filed for this visit.      Subjective Assessment - 06/10/15 0936    Subjective Pt reports she is feeling well; still waiting to see cancer specialist from [facility] to determine next step (in reference to chemotherapy). Pt reports no neck pain at this time, stating "(Pain and stiffness) doesn't seem as bad, but it's still there."   Pertinent History PMH significant: omentum cancer (active; chemo every 3 weeks); h/o breast cancer s/p R lumpectomy  (1989); bipolar disorder, anxiety, depression, OCD, ADD   Patient Stated Goals 4/5: To make my neck less stiff and painful; to be able to drive without pain when I turn my head."   Currently in Pain? No/denies                         [facility] Adult PT Treatment/Exercise - 06/10/15 0001    Posture/Postural Control   Posture/Postural Control Postural limitations   Postural Limitations Increased thoracic kyphosis;Posterior pelvic tilt;Flexed trunk   Exercises   Exercises Other Exercises   Other Exercises  With verbal/demo cueing from PT, pt performed supine B shoulder flexion AAROM using dowel 8 x10-sec holds at endrange (10-second rest breaks between) to increase extensibility of pec major, latissimus dorsi, and teres major for improved postural alignment; thoracic spine extension AROM over back of standard chair 5 x10-sec holds followed by 4 x20-sec holds to increase thoracic spine extension, promote more normalized posture. Supine: cervical retraction with concurrent rotation 5 x10-sec holds at endrange, bilaterally.    Manual Therapy   Manual Therapy Soft tissue mobilization;Myofascial release   Manual therapy comments STM/MFR at R upper trapezius x6 minutes where myofascial adhesion continues to be TPP.  With pt  seated in standard chair, performed contract-relax PNF (4 x3-sec holds each) alternating with prolonged passive stretch (4 x30-sec holds) of cervical spine rotation, bilaterally. Transitioned to supine, suboccipital release x3 minutes. With pt at endrange L rotation, focused suboccipital release to R side (area where pt describes soreness with L rotation) x3 minutes.                PT Education - 06/10/15 1238    Education provided Yes   Education Details HEP modified. See Pt instructions. Provided tennis balls in stockinette for suboccipital release at home.    Person(s) Educated Patient   Methods Explanation;Demonstration;Verbal cues;Handout   Comprehension  Verbalized understanding;Returned demonstration          PT Short Term Goals - 05/25/15 1611    PT SHORT TERM GOAL #1   Title Pt will perform initial HEP with mod I using paper handout to maximize functional gains made in PT.  (Target date: 04/18/15)   Baseline --   Status Achieved   PT SHORT TERM GOAL #2   Title Rule out orthostatic hypotension as contributing factor to dizziness.  (Target date: 04/18/15)   Baseline Orthostatic hypotension ruled out 2/6.   Status Achieved   PT SHORT TERM GOAL #3   Title Pt will improve active cervical spine lateral flexion to 15 degrees bilaterally to progress toward normal neck ROM.  (Target date: 04/18/15)   Baseline Met 4/5.   Status Achieved   PT SHORT TERM GOAL #4   Title Pt will improve active cervical spine rotation to 40 degrees bilaterally progress toward normal neck ROM.   (Target date: 04/18/15)   Baseline See LTG.   Status Deferred  See LTG's.   PT SHORT TERM GOAL #5   Title Pt will improve DGI score from 13/24 to 16/24 to indicate improved dynamic gait stability.  (Target date: 04/18/15)   Baseline 3/1: DGI = 16/24   Status Achieved   PT SHORT TERM GOAL #6   Title Pt will improve gait speed from 2.42 ft/sec to > 2.62 ft/sec to indicate functional status of community ambulator.  (Target date: 04/18/15)   Baseline 3/1: gait velocity = 3.29 ft/sec   Status Achieved           PT Long Term Goals - 06/10/15 1301    PT LONG TERM GOAL #1   Title Pt will improve active cervical spine lateral flexion to 15 degrees bilaterally to progress toward normal neck ROM. (Target date: 05/16/15)   Baseline Met 4/5.   Status Achieved   PT LONG TERM GOAL #2   Title Pt will improve active cervical spine rotation to 40 degrees bilaterally to increase safety with driving.   (Modified Target date: 06/22/14)   Baseline 4/5:  36 degrees to R, 32 degrees to L.      Status On-going   PT LONG TERM GOAL #3   Title Pt will improve DGI score from 13/24 to > / =  20/24 to indicate decreased fall risk.    (Target date: 05/16/15)   Baseline Met 3/22.   Status Achieved   PT LONG TERM GOAL #4   Title Pt will decrease DHI score from 72 to < / = 54 to indicate significant decrease in pt-perceived disability due to dizziness.   (Modified Target date: 06/22/14)   Status On-going   PT LONG TERM GOAL #5   Title Pt will independently negotiate 2 stairs without rails to indicate pt abiliy to safely use primary home  entrance.    (Target date: 05/16/15)   Baseline 4/5: Pt reports she is now able to safely hold onto rail that leads to gait on L rail (ascending). Pt mod I to negotiate 4 stairs with single L rail today,   Status Achieved   PT LONG TERM GOAL #6   Title Pt will independently ambulate > 500' over unlevel, paved surfaces and negotiate standard ramp and curb step to indicate safety with community mobility.    (Target date: 05/16/15)   Baseline Met 4/5.   Status Achieved   PT LONG TERM GOAL #7   Title Pt will consistently perform supine > sit independently to indicate increased independence with bed mobility.   (Target date: 05/16/15)   Baseline Met 3/22.   Status Achieved   PT LONG TERM GOAL #8   Title Decrease NDI score from 38% to 28% to indicate decreased neck-related disability.    (Target date: 06/22/14)   Status On-going               Plan - 06/10/15 1257    Clinical Impression Statement Session focused on use of manual therapy and therapeutic exercise to address soft tissue restrictions/myofascial adhesions to improve postural alignment and promote pain-free ROM in cervical spine.  Provided updated HEP. Also provided pt with 2 tennis balls in stockinette to utilize for self-suboccipital release at home.   Rehab Potential Good   Clinical Impairments Affecting Rehab Potential high insurance copay; currently undergoing chemotherapy   PT Frequency 1x / week   PT Duration 4 weeks   PT Treatment/Interventions ADLs/Self Care Home  Management;Vestibular;Manual techniques;Patient/family education;Neuromuscular re-education;Gait training;Stair training;Functional mobility training;Therapeutic activities;Therapeutic exercise;Balance training;DME Instruction   PT Next Visit Plan Review HEP. Manual therapy to increase pain-free cervical spine AROM. Incorporate trunk rotation. Address pelvic rotation to impact overall posture.   Consulted and Agree with Plan of Care Patient      Patient will benefit from skilled therapeutic intervention in order to improve the following deficits and impairments:  Pain, Postural dysfunction, Decreased range of motion, Hypomobility, Impaired flexibility, Increased fascial restricitons, Decreased strength  Visit Diagnosis: Cervicalgia  Abnormal posture     Problem List Patient Active Problem List   Diagnosis Date Noted  . Family history of breast cancer in female 06/02/2015  . Hypersensitivity reaction 05/23/2015  . Problem with voice production 04/26/2015    Class: Acute  . Weakness of voice 04/26/2015    Class: Acute  . Pathologic change of voice 04/26/2015    Class: Acute  . Dizziness 03/29/2015  . Ovarian cancer ([place]) 08/13/2013  . Malignant neoplasm of female breast ([place]) 09/19/2012  . Lithium use 09/17/2011  . Abnormal coordination 09/17/2011  . Premature menopause   . Depression   . Anxiety   . Bipolar disorder ([place])   . ADD (attention deficit disorder)   . Diabetes mellitus   . NONSPECIFIC ABNORMAL ELECTROCARDIOGRAM 11/04/2009  . ALLERGIC RHINITIS 09/01/2009  . INCONTINENCE 09/01/2009  . DIAB W/O COMP TYPE II/UNS NOT STATED UNCNTRL 07/28/2008  . NONSPEC ELEVATION OF LEVELS OF TRANSAMINASE/LDH 07/28/2008  . MOTOR RESTLESSNESS 02/19/2008  . Internal hemorrhoids without mention of complication 60/73/7106  . ESOPHAGEAL STRICTURE 09/15/2007  . DYSPHAGIA 09/15/2007  . OBSTRUCTIVE SLEEP APNEA 07/21/2007  . HYPERLIPIDEMIA 07/07/2007  . Osteopenia 02/20/2007  .  HYPOTHYROIDISM 10/01/2006  . BREAST CANCER, HX OF 08/12/2006    Billie Ruddy, PT, DPT [facility] [address] [place], [place], [address] Phone: [phone]  Fax:  [phone] 06/10/2015, 1:02 PM  Name: [name] MRN: [id number] Date of Birth: 1955-04-18

## 2015-06-10 NOTE — Patient Instructions (Addendum)
   Thoracic Extension over Chair  - Find a chair so that the back hits mid shoulder blade (don't worry about putting feet on a stool, as pictured) - Clasp hands behind neck  - Lean backwards to feel arch in mid spine - Hold for 20 seconds. Relax. Repeat 5-6 times. Perform 1-2 sets per day.     WAND FLEXION  - SUPINE  Lying on  your back and holding a cane or broomstick, slowly raise the wand towards overhead. Raise cane as high as you can (without pain) and hold for 10 seconds. Move cane back to resting position and rest for 5-10 seconds. Repeat 10 times. Perform 1-2 sets per day.

## 2015-06-13 ENCOUNTER — Ambulatory Visit: Payer: PPO | Admitting: Gynecologic Oncology

## 2015-06-16 ENCOUNTER — Other Ambulatory Visit: Payer: Self-pay

## 2015-06-16 ENCOUNTER — Ambulatory Visit: Payer: PPO | Admitting: Physical Therapy

## 2015-06-16 DIAGNOSIS — Z95828 Presence of other vascular implants and grafts: Secondary | ICD-10-CM | POA: Insufficient documentation

## 2015-06-17 ENCOUNTER — Ambulatory Visit (HOSPITAL_BASED_OUTPATIENT_CLINIC_OR_DEPARTMENT_OTHER): Payer: PPO | Admitting: Oncology

## 2015-06-17 ENCOUNTER — Ambulatory Visit: Payer: PPO

## 2015-06-17 ENCOUNTER — Telehealth: Payer: Self-pay | Admitting: Oncology

## 2015-06-17 ENCOUNTER — Telehealth: Payer: Self-pay | Admitting: Genetic Counselor

## 2015-06-17 ENCOUNTER — Ambulatory Visit: Payer: PPO | Attending: Gynecology | Admitting: Gynecology

## 2015-06-17 ENCOUNTER — Encounter: Payer: Self-pay | Admitting: Gynecology

## 2015-06-17 ENCOUNTER — Other Ambulatory Visit (HOSPITAL_BASED_OUTPATIENT_CLINIC_OR_DEPARTMENT_OTHER): Payer: PPO

## 2015-06-17 VITALS — BP 133/85 | HR 76 | Temp 98.1°F | Resp 18 | Ht 62.0 in | Wt 160.2 lb

## 2015-06-17 VITALS — BP 137/89 | HR 67 | Temp 97.9°F | Resp 16 | Ht 62.0 in | Wt 159.9 lb

## 2015-06-17 DIAGNOSIS — C801 Malignant (primary) neoplasm, unspecified: Secondary | ICD-10-CM

## 2015-06-17 DIAGNOSIS — M8580 Other specified disorders of bone density and structure, unspecified site: Secondary | ICD-10-CM | POA: Diagnosis not present

## 2015-06-17 DIAGNOSIS — Z9889 Other specified postprocedural states: Secondary | ICD-10-CM | POA: Insufficient documentation

## 2015-06-17 DIAGNOSIS — Z803 Family history of malignant neoplasm of breast: Secondary | ICD-10-CM | POA: Diagnosis not present

## 2015-06-17 DIAGNOSIS — F419 Anxiety disorder, unspecified: Secondary | ICD-10-CM

## 2015-06-17 DIAGNOSIS — E785 Hyperlipidemia, unspecified: Secondary | ICD-10-CM | POA: Insufficient documentation

## 2015-06-17 DIAGNOSIS — R188 Other ascites: Secondary | ICD-10-CM | POA: Diagnosis not present

## 2015-06-17 DIAGNOSIS — C569 Malignant neoplasm of unspecified ovary: Secondary | ICD-10-CM

## 2015-06-17 DIAGNOSIS — Z79899 Other long term (current) drug therapy: Secondary | ICD-10-CM | POA: Diagnosis not present

## 2015-06-17 DIAGNOSIS — D696 Thrombocytopenia, unspecified: Secondary | ICD-10-CM | POA: Diagnosis not present

## 2015-06-17 DIAGNOSIS — F329 Major depressive disorder, single episode, unspecified: Secondary | ICD-10-CM

## 2015-06-17 DIAGNOSIS — Z888 Allergy status to other drugs, medicaments and biological substances status: Secondary | ICD-10-CM | POA: Diagnosis not present

## 2015-06-17 DIAGNOSIS — E039 Hypothyroidism, unspecified: Secondary | ICD-10-CM | POA: Insufficient documentation

## 2015-06-17 DIAGNOSIS — C786 Secondary malignant neoplasm of retroperitoneum and peritoneum: Secondary | ICD-10-CM | POA: Diagnosis not present

## 2015-06-17 DIAGNOSIS — F319 Bipolar disorder, unspecified: Secondary | ICD-10-CM | POA: Diagnosis not present

## 2015-06-17 DIAGNOSIS — C50919 Malignant neoplasm of unspecified site of unspecified female breast: Secondary | ICD-10-CM

## 2015-06-17 DIAGNOSIS — Z95828 Presence of other vascular implants and grafts: Secondary | ICD-10-CM

## 2015-06-17 DIAGNOSIS — Z853 Personal history of malignant neoplasm of breast: Secondary | ICD-10-CM

## 2015-06-17 LAB — CBC WITH DIFFERENTIAL/PLATELET
BASO%: 0.7 % (ref 0.0–2.0)
Basophils Absolute: 0 10*3/uL (ref 0.0–0.1)
EOS%: 3.9 % (ref 0.0–7.0)
Eosinophils Absolute: 0.2 10*3/uL (ref 0.0–0.5)
HEMATOCRIT: 38 % (ref 34.8–46.6)
HGB: 12.2 g/dL (ref 11.6–15.9)
LYMPH#: 1.7 10*3/uL (ref 0.9–3.3)
LYMPH%: 35.1 % (ref 14.0–49.7)
MCH: 29.1 pg (ref 25.1–34.0)
MCHC: 32 g/dL (ref 31.5–36.0)
MCV: 90.9 fL (ref 79.5–101.0)
MONO#: 0.8 10*3/uL (ref 0.1–0.9)
MONO%: 15.5 % — ABNORMAL HIGH (ref 0.0–14.0)
NEUT%: 44.8 % (ref 38.4–76.8)
NEUTROS ABS: 2.2 10*3/uL (ref 1.5–6.5)
PLATELETS: 116 10*3/uL — AB (ref 145–400)
RBC: 4.18 10*6/uL (ref 3.70–5.45)
RDW: 16.1 % — ABNORMAL HIGH (ref 11.2–14.5)
WBC: 5 10*3/uL (ref 3.9–10.3)

## 2015-06-17 LAB — COMPREHENSIVE METABOLIC PANEL
ALT: 12 U/L (ref 0–55)
ANION GAP: 8 meq/L (ref 3–11)
AST: 18 U/L (ref 5–34)
Albumin: 3.5 g/dL (ref 3.5–5.0)
Alkaline Phosphatase: 70 U/L (ref 40–150)
BILIRUBIN TOTAL: 0.34 mg/dL (ref 0.20–1.20)
BUN: 19 mg/dL (ref 7.0–26.0)
CO2: 29 meq/L (ref 22–29)
CREATININE: 0.8 mg/dL (ref 0.6–1.1)
Calcium: 10.7 mg/dL — ABNORMAL HIGH (ref 8.4–10.4)
Chloride: 103 mEq/L (ref 98–109)
EGFR: 78 mL/min/{1.73_m2} — ABNORMAL LOW (ref >90)
Glucose: 88 mg/dl (ref 70–140)
Potassium: 5 mEq/L (ref 3.5–5.1)
Sodium: 140 mEq/L (ref 136–145)
TOTAL PROTEIN: 8 g/dL (ref 6.4–8.3)

## 2015-06-17 MED ORDER — SODIUM CHLORIDE 0.9 % IJ SOLN
10.0000 mL | INTRAMUSCULAR | Status: DC | PRN
  Filled 2015-06-17: qty 10

## 2015-06-17 MED ORDER — HEPARIN SOD (PORK) LOCK FLUSH 100 UNIT/ML IV SOLN
500.0000 [IU] | Freq: Once | INTRAVENOUS | Status: DC | PRN
  Filled 2015-06-17: qty 5

## 2015-06-17 MED ORDER — HEPARIN SOD (PORK) LOCK FLUSH 100 UNIT/ML IV SOLN
500.0000 [IU] | Freq: Once | INTRAVENOUS | Status: AC
  Administered 2015-06-17: 500 [IU] via INTRAVENOUS
  Filled 2015-06-17: qty 5

## 2015-06-17 MED ORDER — SODIUM CHLORIDE 0.9 % IJ SOLN
10.0000 mL | Freq: Once | INTRAMUSCULAR | Status: AC
  Administered 2015-06-17: 10 mL via INTRAVENOUS
  Filled 2015-06-17: qty 10

## 2015-06-17 MED ORDER — ALTEPLASE 2 MG IJ SOLR
2.0000 mg | Freq: Once | INTRAMUSCULAR | Status: DC | PRN
  Filled 2015-06-17: qty 2

## 2015-06-17 NOTE — Patient Instructions (Signed)
Options include gemcitabine with cisplatin, carboplatin alone, carboplatin with taxotere.  Follow up with Dr. Alen Blew today to discuss the options.

## 2015-06-17 NOTE — Telephone Encounter (Signed)
Gave and printed appt sched and avs fo rpt for May °

## 2015-06-17 NOTE — Telephone Encounter (Signed)
Discussed with [name] that her genetic test result was negative for any known pathogenic mutations within any of 20 genes on the Breast/Ovarian Cancer Panel through [employer] (MSH2 Exons 1-7 Inversion Analysis was also negative).  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  Reviewed that we treat this just like a negative test result until it gets updated by the lab and reviewed why we do that.  Encouraged [name] to keep her phone number up-to-date with [place] and to call and update the family history with [place] in the future, especially if any other family members are diagnosed with cancer.  Discussed that [name] should continue to follow her doctors' recommendations for future cancer screening.  Discussed that women in her family are likely at an increased risk for breast cancer, simply due to the family history.  Discussed that [name]'s sisters should be having annual breast screening.  [name] nieces can likely begin annual mammogram screening early, likely around the age of 15.  Also discussed that they should be having annual clinical breast exams and performing regular self breast exams.  [name] is welcome to call me with any further questions.  I will make her doctor aware of this result.

## 2015-06-17 NOTE — Progress Notes (Signed)
Hematology and Oncology Follow Up Visit  LAFREDA CASEBEER [id number] 05-13-1955 60 y.o. 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 This was diagnosed in July of 2014.   Prior Therapy:  She is status post paracentesis performed on 09/17/2012 with the cytology confirmed the presence of adenocarcinoma and immunohistochemical stains suggest GYN etiology. She is also status post lumpectomy for breast cancer diagnosed 25 years ago followed by radiation and chemotherapy under the care of Dr. Sonny Dandy. She did not receive any hormonal therapy. Chemotherapy utilizing carboplatin and Taxotere started on 10/01/2012. Avastin was added with cycle 4. This was discontinued in June of 2015. She is S/P Avastin maintenance only between June 2015 till March 2017. Therapy discontinued due to progression of disease. She is status post Doxil salvage chemotherapy therapy discontinued because of hypersensitivity reaction in March 2017.  Current therapy:  Under consideration for different salvage therapy.  Interim History: Mrs. Doyle presents today for a followup visit with her husband. Since her last visit, she reports feeling fairly well without any major complaints. She does report some occasional indigestion and increase in her abdominal girth but not dramatically. She still have some issues with her voice and have received speech therapy after one session some minor improvement. 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  She did receive the first cycle of Doxil chemotherapy but tolerated it poorly with hypersensitivity reaction and therapy have been discontinued. She was evaluated by Dr. Fermin Schwab and have recommended different salvage therapy. She did have BRCA testing which is currently pending.  She does not report any headaches, blurry vision, syncope or  seizures. She denied any fever chills. Has not reported any shortness of breath or cough. She denied any pain with swallowing.  She has not any shortness of breath or chest pain. She denied any GYN bleeding.   Her mood is stable.Angus Seller of the review of systems is unremarkable.  Medications: I have reviewed the patient's current medications.  Current Outpatient Prescriptions  Medication Sig Dispense Refill  . Acetylcarnitine HCl (ACETYL L-CARNITINE) 500 MG CAPS Take 1 capsule by mouth at bedtime.    . ALPRAZolam (XANAX) 1 MG tablet Take 1 mg by mouth at bedtime as needed for anxiety.    . calcium carbonate (TUMS - DOSED IN MG ELEMENTAL CALCIUM) 500 MG chewable tablet Chew 1 tablet by mouth as needed for indigestion or heartburn.    . divalproex (DEPAKOTE) 250 MG DR tablet Take 250 mg by mouth 2 (two) times daily. 572m in the morning and 1500 mg in the evening    . gabapentin (NEURONTIN) 100 MG capsule TAKE ONE CAPSULE BY MOUTH THREE TIMES A DAY 90 capsule 1  . lamoTRIgine (LAMICTAL) 150 MG tablet Take 150 mg by mouth at bedtime.  1  . levothyroxine (SYNTHROID, LEVOTHROID) 100 MCG tablet Take 100 mcg by mouth daily before breakfast.     . lidocaine-prilocaine (EMLA) cream Apply topically as needed. Apply to port with every chemotherapy. 30 g 1  . loperamide (IMODIUM A-D) 2 MG tablet Take 2 mg by mouth as needed for diarrhea or loose stools.    . ondansetron (ZOFRAN ODT) 8 MG disintegrating tablet Take 1 tablet (8 mg total) by mouth every 8 (eight) hours as needed for nausea or vomiting. 20 tablet 0  . ondansetron (ZOFRAN) 8 MG tablet TAKE 1 TABLET BY MOUTH EVERY 8  HOURS AS NEEDED FOR NAUSEA AND VOMITING 20 tablet 1  . rOPINIRole (REQUIP) 1 MG tablet Take 1 mg by mouth at bedtime.     No current facility-administered medications for this visit.   Facility-Administered Medications Ordered in Other Visits  Medication Dose Route Frequency Provider Last Rate Last Dose  . heparin lock flush 100  unit/mL  500 Units Intravenous Once Wyatt Portela, MD      . sodium chloride flush (NS) 0.9 % injection 10 mL  10 mL Intravenous PRN Wyatt Portela, MD         Allergies:  Allergies  Allergen Reactions  . Doxil [Doxorubicin Hcl Liposomal] Anaphylaxis    1st Doxil.   . Pollen Extract Other (See Comments)    Pollen and grass causes a lot sneezing    Past Medical History, Surgical history, Social history, and Family History were reviewed and updated.   Physical Exam: Blood pressure 137/89, pulse 67, temperature 97.9 F (36.6 C), temperature source Oral, resp. rate 16, height _0  (1.575 m), weight 159 lb 14.4 oz (72.53 kg), SpO2 100 %.  ECOG: 1 General appearance: Comfortable-appearing woman without distress. Head: Normocephalic, without obvious abnormality. No oral thrush noted. Neck: no adenopathy Lymph nodes: Cervical, supraclavicular, and axillary nodes normal. Heart:regular rate and rhythm, S1, S2 normal, no murmur, click, rub or gallop Lung:chest clear, no wheezing, rales, normal symmetric air entry Abdomin: Soft, nontender with good bowel sounds. Slightly distended with shifting dullness. EXT:no erythema, induration, or nodules. No edema. Skin: No rashes or lesions.  Lab Results: Lab Results  Component Value Date   WBC 5.0 06/17/2015   HGB 12.2 06/17/2015   HCT 38.0 06/17/2015   MCV 90.9 06/17/2015   PLT 116* 06/17/2015     Chemistry      Component Value Date/Time   NA 144 05/25/2015 0931   NA 139 05/20/2015 1028   NA 143 02/11/2015 1005   K 4.4 05/25/2015 0931   K 4.7 05/20/2015 1028   CL 102 05/25/2015 0931   CO2 26 05/25/2015 0931   CO2 26 05/20/2015 1028   BUN 19 05/25/2015 0931   BUN 15.8 05/20/2015 1028   BUN 16 02/11/2015 1005   CREATININE 0.82 05/25/2015 0931   CREATININE 0.8 05/20/2015 1028      Component Value Date/Time   CALCIUM 8.7 05/25/2015 0931   CALCIUM 10.0 05/20/2015 1028   ALKPHOS 58 05/25/2015 0931   ALKPHOS 78 05/20/2015 1028    AST 23 05/25/2015 0931   AST 24 05/20/2015 1028   ALT 11 05/25/2015 0931   ALT 13 05/20/2015 1028   BILITOT 0.2 05/25/2015 0931   BILITOT <0.30 05/20/2015 1028   BILITOT 0.5 02/11/2015 1005         Impression and Plan:  60 year old woman with the following issues:  1. Peritoneal carcinomatosis with omental involvement. The pathology confirmed the presence of adenocarcinoma of likely GYN etiology. Her CA 125 was elevated at 333.5 on 09/19/2012. She is status post systemic chemotherapy with an excellent response utilizing carboplatin and Taxotere and a Avastin . This therapy was held in June of 2015 and currently on Avastin maintenance.   CT scan obtained on 05/05/2015 as well as her tumor markers showed clear progression. Her CA-125 is up to 100 and her CT scan showed increase in her peritoneal disease. She remains in excellent health and shape and desire continue treatment.  She did not tolerate Doxil with hypersensitivity reaction and since has been discontinued. She was  seen with Dr. Aldean Ast and have recommended BRCA testing and possible PARP inhibitor as a salvage regimen. If her BRCA testing is negative, salvage chemotherapy with carboplatin or carboplatin with Taxotere have been recommended.  The risks and benefits of all these chemotherapy agents were discussed and he would be reasonable to proceed with carboplatin as a single agent if her back at testing is negative. Risks and benefits of chemotherapy were discussed today. Complications include nausea, vomiting, myelosuppression, thrombocytopenia as well as renal toxicity. She is agreeable to proceed with this plan.    2. IV access: Port-A-Cath is in place without any issues or complications. This was flushed today.  3. Thrombocytopenia: Her platelets count continues to improve without any bleeding episodes.   4. Abdominal distention: We will evaluate her for possible ascites and a possible paracentesis of fluid exist.  We'll arrange for that in the next few days.  5. Anxiety and depression: Her mood appeared to be stable and have accepted the news of cancer progression rather well and she is ready to proceed with the next phase of therapy.  8. Hypercalcemia: Very mild at this time and asymptomatic. We'll continue to monitor this closely and if she develops hypercalcemia malignancy, we will treat appropriately.  9. The followup: She will in 3 weeks for potentially start of chemotherapy sooner if needed to.    [facility], MD 4/28/20179:48 AM

## 2015-06-17 NOTE — Addendum Note (Signed)
Addended by: Joylene John D on: 06/17/2015 12:58 PM   Modules accepted: Level of Service

## 2015-06-17 NOTE — Progress Notes (Signed)
Consult Note: Gyn-Onc   [name] 60 y.o. female  Chief Complaint  Patient presents with  . 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 Recent hypersensitivity reaction to second line Doxil therapy.  Plan: Treatment options were discussed with the patient and her husband. Given the long platinum- free interval, I would recommend reintroducing carboplatin or cisplatin to the next regimen. That could include the following:  Single agent carboplatin  Combination therapy with cisplatin and gemcitabine. (This would not be my preference)  Carboplatin and Taxotere.  We are awaiting genetic testing but if the patient were BRCA positive consideration of adding a PARP inhibitor would be strongly considered.  Other agents to consider might include topotecan, Taxotere, oral etoposide, and weekly Taxol.   HPI: Patient seen today in consultation at the request of Dr. Alen Blew regarding management options for progressive primary peritoneal carcinoma. The patient initially presented in July 2014 with ascites and an omental cake. Paracentesis and biopsy fountain adenocarcinoma consistent with a primary peritoneal married. The patient was initially treated with carboplatin and Taxotere (Taxotere was substituted for Taxol because of neuropathy). After 3 cycles she had relatively stable disease and a Avastin was added. Following completion of chemotherapy the patient was placed on maintenance Avastin in June 2015 through March 2017. That time her CA-125 had risen slightly to 100 units per mL. CT scan showed diffuse carcinomatosis, omental cake, and some ascites.  Currently the patient has some "indigestion". She's had chronic diarrhea for much of her life. She also has some "heartburn". Otherwise she has no abdominal pain or bloating or any urinary tract  symptoms.  The patient has a past history of breast cancer at age 3 and a family history of 3 maternal aunts with breast cancer. She has recently been seen by genetics counselor and genetic testing is pending.  Second line therapy was initiated on 05/20/2015 using Doxil. Unfortunately, the patient had a profound hypersensitivity reaction.  Review of Systems:10 point review of systems is negative except as noted in interval history.   Vitals: Blood pressure 133/85, pulse 76, temperature 98.1 F (36.7 C), temperature source Oral, resp. rate 18, height _0  (1.575 m), weight 160 lb 4 oz (72.689 kg).  Physical Exam: General : The patient is a healthy woman in no acute distress.  HEENT: normocephalic, extraoccular movements normal; neck is supple without thyromegally  Lynphnodes: Supraclavicular and inguinal nodes not enlarged  Abdomen: Soft, non-tender, slightly distended. no organomegally, no masses, no hernias  Pelvic:  EGBUS: Normal female  Vagina: Normal, no lesions  Urethra and Bladder: Normal, non-tender  Cervix: Normal Uterus: Difficult to outline secondary to the patient's habitus. Bi-manual examination: Non-tender; no adenxal masses or nodularity  Rectal: normal sphincter tone, no masses, no blood  Lower extremities: No edema or varicosities. Normal range of motion      Allergies  Allergen Reactions  . Doxil [Doxorubicin Hcl Liposomal] Anaphylaxis    1st Doxil.   . Pollen Extract Other (See Comments)    Pollen and grass causes a lot sneezing    Past Medical History  Diagnosis Date  . Premature menopause   . Osteopenia 03/2009    t score -2.1 FRAX 4.6/0.4  . Hypothyroidism   . Depression   . Anxiety   . Hyperlipidemia   . Bipolar disorder ([place])   . ADD (attention deficit disorder)   .  Hematochezia   . Allergy     seasonal  . Maintenance chemotherapy     Pt has chemo every 3 weeks (on Friday)  . Sleep apnea     mild  . Breast cancer ([place]) 1989    right  breast  . Diabetes mellitus     pt denies DM noe meds    Past Surgical History  Procedure Laterality Date  . Breast surgery  1989    RIGHT LUMPECTOMY, RADIATION AND CHEMO  . Hysteroscopy  2011    Polyp  . Pelvic laparoscopy/ hysteroscopy  1996  . Foot surgery  2013    BILATERAL     Current Outpatient Prescriptions  Medication Sig Dispense Refill  . Acetylcarnitine HCl (ACETYL L-CARNITINE) 500 MG CAPS Take 1 capsule by mouth at bedtime.    . ALPRAZolam (XANAX) 1 MG tablet Take 1 mg by mouth at bedtime as needed for anxiety.    . calcium carbonate (TUMS - DOSED IN MG ELEMENTAL CALCIUM) 500 MG chewable tablet Chew 1 tablet by mouth as needed for indigestion or heartburn.    . divalproex (DEPAKOTE) 250 MG DR tablet Take 250 mg by mouth 2 (two) times daily. '500mg'$  in the morning and 1500 mg in the evening    . gabapentin (NEURONTIN) 100 MG capsule TAKE ONE CAPSULE BY MOUTH THREE TIMES A DAY 90 capsule 1  . lamoTRIgine (LAMICTAL) 150 MG tablet Take 150 mg by mouth at bedtime.  1  . levothyroxine (SYNTHROID, LEVOTHROID) 100 MCG tablet Take 100 mcg by mouth daily before breakfast.     . lidocaine-prilocaine (EMLA) cream Apply topically as needed. Apply to port with every chemotherapy. 30 g 1  . loperamide (IMODIUM A-D) 2 MG tablet Take 2 mg by mouth as needed for diarrhea or loose stools.    . ondansetron (ZOFRAN ODT) 8 MG disintegrating tablet Take 1 tablet (8 mg total) by mouth every 8 (eight) hours as needed for nausea or vomiting. 20 tablet 0  . ondansetron (ZOFRAN) 8 MG tablet TAKE 1 TABLET BY MOUTH EVERY 8 HOURS AS NEEDED FOR NAUSEA AND VOMITING 20 tablet 1  . rOPINIRole (REQUIP) 1 MG tablet Take 1 mg by mouth at bedtime.     No current facility-administered medications for this visit.   Facility-Administered Medications Ordered in Other Visits  Medication Dose Route Frequency Provider Last Rate Last Dose  . heparin lock flush 100 unit/mL  500 Units Intravenous Once Wyatt Portela, MD       . sodium chloride flush (NS) 0.9 % injection 10 mL  10 mL Intravenous PRN Wyatt Portela, MD        Social History   Social History  . Marital Status: Married    Spouse Name: [name]  . Number of Children: 0  . Years of Education: 14   Occupational History  . unemployed    Social History Main Topics  . Smoking status: Never Smoker   . Smokeless tobacco: Never Used  . Alcohol Use: 0.0 oz/week    0 Standard drinks or equivalent per week     Comment: very rarely - maybe 1 glass wine every 3 mos  . Drug Use: No  . Sexual Activity: No   Other Topics Concern  . Not on file   Social History Narrative   Lives at home with husband.    Caffeine use:  Tea/soda occass    Family History  Problem Relation Age of Onset  . Breast cancer Maternal  Aunt 29  . Diabetes Maternal Grandmother   . Heart disease Maternal Grandmother   . Heart disease Maternal Grandfather   . Hypertension Paternal Grandfather   . Heart Problems Paternal Grandfather   . Breast cancer Maternal Aunt     dx. early 7s; had negative GT approx 10 years ago  . Breast cancer Maternal Aunt     dx. 7s with recurrence  . Ovarian cancer Neg Hx   . Colon cancer Neg Hx   . Allergies Father   . Heart disease Mother   . Other Mother     hx of hysterectomy   . Liver cancer Cousin 40    +EtOH  . Cancer Cousin     paternal 1st cousin, once-removed dx. NOS cancer (maybe ovarian) in late 30s-40s  . Cancer Cousin     female paternal 2nd cousin d. of NOS cancer in her 20s-early 37s      Geronimo, MD 06/17/2015, 8:28 AM

## 2015-06-18 LAB — CA 125: Cancer Antigen (CA) 125: 125.5 U/mL — ABNORMAL HIGH (ref 0.0–38.1)

## 2015-06-20 ENCOUNTER — Ambulatory Visit: Payer: Self-pay | Admitting: Genetic Counselor

## 2015-06-20 DIAGNOSIS — Z853 Personal history of malignant neoplasm of breast: Secondary | ICD-10-CM

## 2015-06-20 DIAGNOSIS — Z809 Family history of malignant neoplasm, unspecified: Secondary | ICD-10-CM

## 2015-06-20 DIAGNOSIS — Z803 Family history of malignant neoplasm of breast: Secondary | ICD-10-CM

## 2015-06-20 DIAGNOSIS — Z1379 Encounter for other screening for genetic and chromosomal anomalies: Secondary | ICD-10-CM

## 2015-06-20 DIAGNOSIS — C569 Malignant neoplasm of unspecified ovary: Secondary | ICD-10-CM

## 2015-06-22 ENCOUNTER — Ambulatory Visit
Admission: RE | Admit: 2015-06-22 | Discharge: 2015-06-22 | Disposition: A | Discharge status: Home / Self Care | Payer: PPO | Source: Ambulatory Visit

## 2015-06-22 DIAGNOSIS — Z1231 Encounter for screening mammogram for malignant neoplasm of breast: Secondary | ICD-10-CM

## 2015-06-23 ENCOUNTER — Ambulatory Visit: Payer: PPO | Attending: Neurology | Admitting: Physical Therapy

## 2015-06-23 DIAGNOSIS — R293 Abnormal posture: Secondary | ICD-10-CM | POA: Insufficient documentation

## 2015-06-23 DIAGNOSIS — M542 Cervicalgia: Secondary | ICD-10-CM | POA: Diagnosis not present

## 2015-06-23 NOTE — Therapy (Addendum)
[facility] [address] [place], [place], [address] Phone: [phone]   Fax:  [phone]  Physical Therapy Treatment  and Discharge Summary   Patient Details  Name: [name] MRN: [id number] Date of Birth: [birth year] Referring Provider: Sarina Ill, MD  Encounter Date: 06/23/2015      PT End of Session - 06/23/15 1559    Visit Number 13   Number of Visits 15   Date for PT Re-Evaluation 07/19/15   Authorization Type Medicare - G Codes every 10 visits   PT Start Time 4967   PT Stop Time 1532   PT Time Calculation (min) 39 min   Activity Tolerance Patient tolerated treatment well;No increased pain   Behavior During Therapy [facility] for tasks assessed/performed      Past Medical History  Diagnosis Date  . Premature menopause   . Osteopenia 03/2009    t score -2.1 FRAX 4.6/0.4  . Hypothyroidism   . Depression   . Anxiety   . Hyperlipidemia   . Bipolar disorder ([place])   . ADD (attention deficit disorder)   . Hematochezia   . Allergy     seasonal  . Maintenance chemotherapy     Pt has chemo every 3 weeks (on Friday)  . Sleep apnea     mild  . Breast cancer ([place]) 1989    right breast  . Diabetes mellitus     pt denies DM noe meds    Past Surgical History  Procedure Laterality Date  . Breast surgery  1989    RIGHT LUMPECTOMY, RADIATION AND CHEMO  . Hysteroscopy  2011    Polyp  . Pelvic laparoscopy/ hysteroscopy  1996  . Foot surgery  2013    BILATERAL     There were no vitals filed for this visit.      Subjective Assessment - 06/23/15 1456    Subjective No neck pain at this time, but pt does report stiffness with rotation and side bending. Pt states, "I'll be honest, I got home and couldn't really figure out what I was supposed to do. I tried to go through some of my old exercises you've given me and I couldn't quite figure it out."   Pertinent History PMH significant: omentum cancer (active;  chemo every 3 weeks); h/o breast cancer s/p R lumpectomy (1989); bipolar disorder, anxiety, depression, OCD, ADD   Patient Stated Goals 4/5: To make my neck less stiff and painful; to be able to drive without pain when I turn my head."   Currently in Pain? No/denies                         [facility] Adult PT Treatment/Exercise - 06/23/15 0001    Exercises   Exercises Other Exercises   Other Exercises  Pt performed all former home exercises with use of paper handout and with mod cueing for technique. Exercises included shoulder flexion AAROM with dowel/cane x10 reps, seated thoracic spine extension AAROM using chair 4 x20-sec holds; and self-suboccipital release (supine) x3 minutes using 2 tennis balls in stockinette. Pt unable to find broomstick at home, therefore removed shoulder flexion AAROM from HEP. With explanation and demo from PT, pt effectively performed standing scapular rows 2 x10 reps resisted by yellow Tband with cueing for focus on retraction/depression of scapulae and cueing to prevent overactivity of upper trapezius. Reviewed seated upper trapezius self-stretch 2 x30-sec holds per side.  PT Education - 06/23/15 1546    Education provided Yes   Education Details Reviewed/progressed HEP; see Pt Instructions.    Person(s) Educated Patient   Methods Explanation;Demonstration;Handout   Comprehension Need further instruction;Returned demonstration;Verbalized understanding  May need further instruction.          PT Short Term Goals - 05/25/15 1611    PT SHORT TERM GOAL #1   Title Pt will perform initial HEP with mod I using paper handout to maximize functional gains made in PT.  (Target date: 04/18/15)   Baseline --   Status Achieved   PT SHORT TERM GOAL #2   Title Rule out orthostatic hypotension as contributing factor to dizziness.  (Target date: 04/18/15)   Baseline Orthostatic hypotension ruled out 2/6.   Status Achieved   PT SHORT TERM  GOAL #3   Title Pt will improve active cervical spine lateral flexion to 15 degrees bilaterally to progress toward normal neck ROM.  (Target date: 04/18/15)   Baseline Met 4/5.   Status Achieved   PT SHORT TERM GOAL #4   Title Pt will improve active cervical spine rotation to 40 degrees bilaterally progress toward normal neck ROM.   (Target date: 04/18/15)   Baseline See LTG.   Status Deferred  See LTG's.   PT SHORT TERM GOAL #5   Title Pt will improve DGI score from 13/24 to 16/24 to indicate improved dynamic gait stability.  (Target date: 04/18/15)   Baseline 3/1: DGI = 16/24   Status Achieved   PT SHORT TERM GOAL #6   Title Pt will improve gait speed from 2.42 ft/sec to > 2.62 ft/sec to indicate functional status of community ambulator.  (Target date: 04/18/15)   Baseline 3/1: gait velocity = 3.29 ft/sec   Status Achieved           PT Long Term Goals - 06/10/15 1301    PT LONG TERM GOAL #1   Title Pt will improve active cervical spine lateral flexion to 15 degrees bilaterally to progress toward normal neck ROM. (Target date: 05/16/15)   Baseline Met 4/5.   Status Achieved   PT LONG TERM GOAL #2   Title Pt will improve active cervical spine rotation to 40 degrees bilaterally to increase safety with driving.   (Modified Target date: 06/22/14)   Baseline 4/5:  36 degrees to R, 32 degrees to L.      Status On-going   PT LONG TERM GOAL #3   Title Pt will improve DGI score from 13/24 to > / = 20/24 to indicate decreased fall risk.    (Target date: 05/16/15)   Baseline Met 3/22.   Status Achieved   PT LONG TERM GOAL #4   Title Pt will decrease DHI score from 72 to < / = 54 to indicate significant decrease in pt-perceived disability due to dizziness.   (Modified Target date: 06/22/14)   Status On-going   PT LONG TERM GOAL #5   Title Pt will independently negotiate 2 stairs without rails to indicate pt abiliy to safely use primary home entrance.    (Target date: 05/16/15)   Baseline 4/5: Pt  reports she is now able to safely hold onto rail that leads to gait on L rail (ascending). Pt mod I to negotiate 4 stairs with single L rail today,   Status Achieved   PT LONG TERM GOAL #6   Title Pt will independently ambulate > 500' over unlevel, paved surfaces and negotiate standard ramp and curb  step to indicate safety with community mobility.    (Target date: 05/16/15)   Baseline Met 4/5.   Status Achieved   PT LONG TERM GOAL #7   Title Pt will consistently perform supine > sit independently to indicate increased independence with bed mobility.   (Target date: 05/16/15)   Baseline Met 3/22.   Status Achieved   PT LONG TERM GOAL #8   Title Decrease NDI score from 38% to 28% to indicate decreased neck-related disability.    (Target date: 06/22/14)   Status On-going               Plan - 06/23/15 1600    Clinical Impression Statement Session focused on finalizing HEP in preparation for discharge after next PT session. Although exercises seem to improve pain and stiffness in cervical spine, pt having some difficulty recalling HEP. Therefore, PT demonstrated HEP then had pt teach each home exercise to PT during this ession (using paper handout). Pt in agreement with planned DC after next session.    Rehab Potential Good   Clinical Impairments Affecting Rehab Potential high insurance copay; currently undergoing chemotherapy   PT Frequency 1x / week   PT Duration 4 weeks   PT Treatment/Interventions ADLs/Self Care Home Management;Vestibular;Manual techniques;Patient/family education;Neuromuscular re-education;Gait training;Stair training;Functional mobility training;Therapeutic activities;Therapeutic exercise;Balance training;DME Instruction   PT Next Visit Plan Check remaining goals and DC. * FOTO [facility]) and paper NDI.   Consulted and Agree with Plan of Care Patient      Patient will benefit from skilled therapeutic intervention in order to improve the following deficits and impairments:   Pain, Postural dysfunction, Decreased range of motion, Hypomobility, Impaired flexibility, Increased fascial restricitons, Decreased strength  Visit Diagnosis: Cervicalgia  Abnormal posture     Problem List Patient Active Problem List   Diagnosis Date Noted  . Port catheter in place 06/16/2015  . Family history of breast cancer in female 06/02/2015  . Hypersensitivity reaction 05/23/2015  . Problem with voice production 04/26/2015    Class: Acute  . Weakness of voice 04/26/2015    Class: Acute  . Pathologic change of voice 04/26/2015    Class: Acute  . Dizziness 03/29/2015  . Ovarian cancer ([place]) 08/13/2013  . Malignant neoplasm of female breast ([place]) 09/19/2012  . Lithium use 09/17/2011  . Abnormal coordination 09/17/2011  . Premature menopause   . Depression   . Anxiety   . Bipolar disorder ([place])   . ADD (attention deficit disorder)   . Diabetes mellitus   . NONSPECIFIC ABNORMAL ELECTROCARDIOGRAM 11/04/2009  . ALLERGIC RHINITIS 09/01/2009  . INCONTINENCE 09/01/2009  . DIAB W/O COMP TYPE II/UNS NOT STATED UNCNTRL 07/28/2008  . NONSPEC ELEVATION OF LEVELS OF TRANSAMINASE/LDH 07/28/2008  . MOTOR RESTLESSNESS 02/19/2008  . Internal hemorrhoids without mention of complication 02/77/4128  . ESOPHAGEAL STRICTURE 09/15/2007  . DYSPHAGIA 09/15/2007  . OBSTRUCTIVE SLEEP APNEA 07/21/2007  . HYPERLIPIDEMIA 07/07/2007  . Osteopenia 02/20/2007  . HYPOTHYROIDISM 10/01/2006  . BREAST CANCER, HX OF 08/12/2006    Billie Ruddy, PT, DPT [facility] [address] [place], [place], [address] Phone: [phone]   Fax:  [phone] 06/23/2015, 4:04 PM   Name: [name] MRN: [id number] Date of Birth: [birth year]   PHYSICAL THERAPY DISCHARGE SUMMARY  Visits from Start of Care: 13  Current functional level related to goals / functional outcomes: Unknown, as patient did not return to PT after initial 13 sessions.    Remaining  deficits: Unknown, as patient did  not return to PT after initial 13 sessions.    Education / Equipment: See above.  Patient contacted this clinic to cancel remaining appts due to change in medical status. Patient in full agreement with DC from PT.  Plan: Patient agrees to discharge.  Patient goals were not met. Patient is being discharged due to a change in medical status.  ?????        Billie Ruddy, PT, DPT [facility] [address] [place], [place], [address] Phone: [phone]   Fax:  [phone] 01/11/16, 9:55 AM

## 2015-06-23 NOTE — Patient Instructions (Signed)
   Thoracic Extension over Chair  - Find a chair so that the back hits mid shoulder blade (don't worry about putting feet on a stool, as pictured) - Clasp hands behind neck  - Lean backwards to feel arch in mid spine - Hold for 20 seconds. Relax. Repeat 5-6 times. Perform 1-2 sets per day.   NECK TENSION: Assisted Stretch    Reach right arm around head and hold slightly above ear. Gently bring right ear toward right shoulder. Hold position for _30__ breaths. Repeat with other arm. Repeat _3-4__ times per day, alternating arms.       Stand facing door holding onto each end of YELLOW resistance band. Perform a row, pulling shoulder blades together and downward. Hold for 2 seconds, then slowly return to resting position. Perform 10 reps, 2 times per day with the YELLOW band.    Lie on your back with the 2 tennis balls at the base of your head (as pictured). This should feel like massage on a tight muscle. Do this for at least 2-3 minutes per day.

## 2015-06-24 ENCOUNTER — Other Ambulatory Visit: Payer: Self-pay | Admitting: Oncology

## 2015-06-24 DIAGNOSIS — R490 Dysphonia: Secondary | ICD-10-CM | POA: Diagnosis not present

## 2015-06-24 DIAGNOSIS — K219 Gastro-esophageal reflux disease without esophagitis: Secondary | ICD-10-CM | POA: Diagnosis not present

## 2015-06-24 DIAGNOSIS — R928 Other abnormal and inconclusive findings on diagnostic imaging of breast: Secondary | ICD-10-CM

## 2015-06-24 DIAGNOSIS — J343 Hypertrophy of nasal turbinates: Secondary | ICD-10-CM | POA: Diagnosis not present

## 2015-06-27 ENCOUNTER — Encounter: Payer: Self-pay | Admitting: Oncology

## 2015-06-29 DIAGNOSIS — R079 Chest pain, unspecified: Secondary | ICD-10-CM | POA: Diagnosis not present

## 2015-06-30 ENCOUNTER — Ambulatory Visit: Payer: PPO | Admitting: Physical Therapy

## 2015-07-01 ENCOUNTER — Other Ambulatory Visit: Payer: Self-pay | Admitting: Oncology

## 2015-07-01 ENCOUNTER — Ambulatory Visit (HOSPITAL_COMMUNITY)
Admission: RE | Admit: 2015-07-01 | Discharge: 2015-07-01 | Disposition: A | Discharge status: Home / Self Care | Payer: PPO | Source: Ambulatory Visit | Attending: Oncology | Admitting: Oncology

## 2015-07-01 DIAGNOSIS — C569 Malignant neoplasm of unspecified ovary: Secondary | ICD-10-CM | POA: Diagnosis not present

## 2015-07-01 DIAGNOSIS — R188 Other ascites: Secondary | ICD-10-CM | POA: Diagnosis not present

## 2015-07-01 DIAGNOSIS — R935 Abnormal findings on diagnostic imaging of other abdominal regions, including retroperitoneum: Secondary | ICD-10-CM | POA: Diagnosis not present

## 2015-07-01 NOTE — Progress Notes (Signed)
Patient ID: [name], female   DOB: [birth year], 60 y.o.   MRN: [id number] Pt presented to [place] dept today for paracentesis. On limited US abd in all four quadrants there is no significant ascites present. Procedure was cancelled. Pt informed.

## 2015-07-04 ENCOUNTER — Ambulatory Visit
Admission: RE | Admit: 2015-07-04 | Discharge: 2015-07-04 | Disposition: A | Discharge status: Home / Self Care | Payer: PPO | Source: Ambulatory Visit | Attending: Oncology | Admitting: Oncology

## 2015-07-04 ENCOUNTER — Other Ambulatory Visit: Payer: Self-pay | Admitting: Oncology

## 2015-07-04 DIAGNOSIS — R928 Other abnormal and inconclusive findings on diagnostic imaging of breast: Secondary | ICD-10-CM

## 2015-07-04 DIAGNOSIS — R599 Enlarged lymph nodes, unspecified: Secondary | ICD-10-CM

## 2015-07-04 DIAGNOSIS — N6320 Unspecified lump in the left breast, unspecified quadrant: Secondary | ICD-10-CM

## 2015-07-04 DIAGNOSIS — N63 Unspecified lump in breast: Secondary | ICD-10-CM | POA: Diagnosis not present

## 2015-07-04 DIAGNOSIS — Z1379 Encounter for other screening for genetic and chromosomal anomalies: Secondary | ICD-10-CM | POA: Insufficient documentation

## 2015-07-04 NOTE — Progress Notes (Signed)
GENETIC TEST RESULT  HPI: Ms. Lagasse was previously seen in the [place] clinic due to a personal history of breast cancer at age 60 and a personal history of omental cancer, family history of breast and other cancers and concerns regarding a hereditary predisposition to cancer. Please refer to our prior cancer genetics clinic note from June 02, 2015 for more information regarding Ms. [facility] medical, social and family histories, and our assessment and recommendations, at the time. Ms. Mealor recent genetic test results were disclosed to her, as were recommendations warranted by these results. These results and recommendations are discussed in more detail below.  GENETIC TEST RESULTS: At the time of Ms. Bonneville's visit on 06/02/15, we recommended she pursue genetic testing of the 20-gene Breast/Ovarian Cancer Panel through [facility].  The Breast/Ovarian Cancer Panel offered by [facility] Hope Pigeon, MD) includes sequencing and deletion/duplication analysis for the following 19 genes:  ATM, BARD1, BRCA1, BRCA2, BRIP1, CDH1, CHEK2, FANCC, MLH1, MSH2 (with exons 1-7 inversion analysis), MSH6, NBN, PALB2, PMS2, PTEN, RAD51C, RAD51D, TP53, and XRCC2.  This panel also includes deletion/duplication analysis (without sequencing) for one gene, EPCAM.  Those results are now back, the report date for which is June 14, 2015.  Genetic testing was normal, and did not reveal a deleterious mutation in these genes.  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  The test report will be scanned into EPIC and will be located under the Results Review tab in the Pathology>Molecular Pathology section.   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 At this time, it is unknown if this VUS is associated with an increased risk for cancer or if this is a normal finding.  Since this VUS result is uncertain, it cannot help guide screening recommendations, and family members should not be tested for this VUS to help define their own cancer risks.  Also, we all have variants within our genes that make [place] unique individuals--most of these variants are benign.  Thus, we treat this VUS as a negative result.   With time, we suspect the lab will reclassify this variant and when they do, we will try to re-contact Ms. Stepter to discuss the reclassification further.  We also encouraged Ms. Samara to contact us in a year or two to obtain an update on the status of this VUS.  We discussed with Ms. Handy that since the current genetic testing is not perfect, it is possible there may be a gene mutation in one of these genes that current testing cannot detect, but that chance is small. We also discussed, that it is possible that another gene that has not yet been discovered, or that we have not yet tested, is responsible for the cancer diagnoses in the family, and it is, therefore, important to remain in touch with cancer genetics in the future so that we can continue to offer Ms. Bradner the most up to date genetic testing.   CANCER SCREENING RECOMMENDATIONS: This result may be reassuring and indicate that Ms. Leppanen does not have an increased risk for a future cancer due to a mutation in one of these genes. This normal test seems to suggest that Ms. Nicolls's cancer was most likely not due to an inherited predisposition associated with one of these genes.  Most cancers happen by chance and this negative test suggests that her cancer falls into this category.  However, we still do not have an explanation for the personal and family history of cancer.  Ms. Klepper was diagnosed with breast cancer at a much younger age, as was one of her maternal aunts (at age 58), and two other maternal aunts have also had breast cancer.  Our testing may not be good enough to identify a genetic cause  for these cancers, at this point in time.  On the other hand, however, Ms. Roemer's mother is currently 19 and has never had cancer.  We, therefore, recommended Ms. Brinkmeyer continue to follow the cancer management and screening guidelines provided by her oncology and primary healthcare providers.  She should discuss with her doctors whether any additional breast cancer screening (such as breast MRIs) would be indicated in the future.    RECOMMENDATIONS FOR FAMILY MEMBERS: Women in this family might be at some increased risk of developing cancer, over the general population risk, simply due to the family history of cancer. We recommended women in this family have a yearly mammogram beginning at age 30, or 32 years younger than the earliest onset of cancer, an an annual clinical breast exam, and perform monthly breast self-exams. Ms. Seybold sister should be having annual mammogram screening already.  Her nieces can likely begin annual mammogram screening around the age of 46, if they have not yet begun.  Women in this family should also have a gynecological exam as recommended by their primary provider. All family members should have a colonoscopy by age 30.  FOLLOW-UP: Lastly, we discussed with Ms. Hollen that cancer genetics is a rapidly advancing field and it is possible that new genetic tests will be appropriate for her and/or her family members in the future. We encouraged her to remain in contact with cancer genetics on an annual basis so we can update her personal and family histories and let her know of advances in cancer genetics that may benefit this family.   Our contact number was provided. Ms. Balin questions were answered to her satisfaction, and she knows she is welcome to call us at anytime with additional questions or concerns.   Jeanine Luz, MS, [facility] Certified Genetic Counselor [email or web address] Phone: [phone]

## 2015-07-07 ENCOUNTER — Ambulatory Visit
Admission: RE | Admit: 2015-07-07 | Discharge: 2015-07-07 | Disposition: A | Discharge status: Home / Self Care | Payer: PPO | Source: Ambulatory Visit | Attending: Oncology | Admitting: Oncology

## 2015-07-07 ENCOUNTER — Other Ambulatory Visit: Payer: Self-pay | Admitting: Oncology

## 2015-07-07 DIAGNOSIS — N6320 Unspecified lump in the left breast, unspecified quadrant: Secondary | ICD-10-CM

## 2015-07-07 DIAGNOSIS — N63 Unspecified lump in breast: Secondary | ICD-10-CM | POA: Diagnosis not present

## 2015-07-07 DIAGNOSIS — R599 Enlarged lymph nodes, unspecified: Secondary | ICD-10-CM

## 2015-07-07 DIAGNOSIS — C50412 Malignant neoplasm of upper-outer quadrant of left female breast: Secondary | ICD-10-CM | POA: Diagnosis not present

## 2015-07-07 DIAGNOSIS — R590 Localized enlarged lymph nodes: Secondary | ICD-10-CM | POA: Diagnosis not present

## 2015-07-07 DIAGNOSIS — C773 Secondary and unspecified malignant neoplasm of axilla and upper limb lymph nodes: Secondary | ICD-10-CM | POA: Diagnosis not present

## 2015-07-07 HISTORY — PX: BREAST BIOPSY: SHX20

## 2015-07-08 ENCOUNTER — Other Ambulatory Visit (HOSPITAL_BASED_OUTPATIENT_CLINIC_OR_DEPARTMENT_OTHER): Payer: PPO

## 2015-07-08 ENCOUNTER — Ambulatory Visit: Payer: PPO

## 2015-07-08 ENCOUNTER — Ambulatory Visit (HOSPITAL_BASED_OUTPATIENT_CLINIC_OR_DEPARTMENT_OTHER): Payer: PPO

## 2015-07-08 ENCOUNTER — Telehealth: Payer: Self-pay

## 2015-07-08 ENCOUNTER — Telehealth: Payer: Self-pay | Admitting: Oncology

## 2015-07-08 ENCOUNTER — Other Ambulatory Visit: Payer: Self-pay | Admitting: *Deleted

## 2015-07-08 ENCOUNTER — Ambulatory Visit (HOSPITAL_BASED_OUTPATIENT_CLINIC_OR_DEPARTMENT_OTHER): Payer: PPO | Admitting: Oncology

## 2015-07-08 VITALS — BP 140/86 | HR 72 | Temp 96.8°F | Resp 18 | Ht 62.0 in | Wt 163.6 lb

## 2015-07-08 DIAGNOSIS — C786 Secondary malignant neoplasm of retroperitoneum and peritoneum: Secondary | ICD-10-CM

## 2015-07-08 DIAGNOSIS — D696 Thrombocytopenia, unspecified: Secondary | ICD-10-CM

## 2015-07-08 DIAGNOSIS — R188 Other ascites: Secondary | ICD-10-CM | POA: Diagnosis not present

## 2015-07-08 DIAGNOSIS — C569 Malignant neoplasm of unspecified ovary: Secondary | ICD-10-CM

## 2015-07-08 DIAGNOSIS — Z95828 Presence of other vascular implants and grafts: Secondary | ICD-10-CM

## 2015-07-08 DIAGNOSIS — Z5111 Encounter for antineoplastic chemotherapy: Secondary | ICD-10-CM

## 2015-07-08 DIAGNOSIS — C801 Malignant (primary) neoplasm, unspecified: Secondary | ICD-10-CM

## 2015-07-08 DIAGNOSIS — C50919 Malignant neoplasm of unspecified site of unspecified female breast: Secondary | ICD-10-CM

## 2015-07-08 DIAGNOSIS — N63 Unspecified lump in breast: Secondary | ICD-10-CM

## 2015-07-08 LAB — CBC WITH DIFFERENTIAL/PLATELET
BASO%: 0.3 % (ref 0.0–2.0)
Basophils Absolute: 0 10*3/uL (ref 0.0–0.1)
EOS%: 3.3 % (ref 0.0–7.0)
Eosinophils Absolute: 0.1 10*3/uL (ref 0.0–0.5)
HCT: 37 % (ref 34.8–46.6)
HGB: 11.2 g/dL — ABNORMAL LOW (ref 11.6–15.9)
LYMPH%: 41.3 % (ref 14.0–49.7)
MCH: 28.6 pg (ref 25.1–34.0)
MCHC: 30.3 g/dL — AB (ref 31.5–36.0)
MCV: 94.6 fL (ref 79.5–101.0)
MONO#: 0.3 10*3/uL (ref 0.1–0.9)
MONO%: 9.1 % (ref 0.0–14.0)
NEUT%: 46 % (ref 38.4–76.8)
NEUTROS ABS: 1.5 10*3/uL (ref 1.5–6.5)
Platelets: 96 10*3/uL — ABNORMAL LOW (ref 145–400)
RBC: 3.91 10*6/uL (ref 3.70–5.45)
RDW: 15.5 % — ABNORMAL HIGH (ref 11.2–14.5)
WBC: 3.3 10*3/uL — AB (ref 3.9–10.3)
lymph#: 1.4 10*3/uL (ref 0.9–3.3)

## 2015-07-08 LAB — COMPREHENSIVE METABOLIC PANEL
ALT: 10 U/L (ref 0–55)
AST: 15 U/L (ref 5–34)
Albumin: 3.3 g/dL — ABNORMAL LOW (ref 3.5–5.0)
Alkaline Phosphatase: 67 U/L (ref 40–150)
Anion Gap: 7 mEq/L (ref 3–11)
BUN: 10.5 mg/dL (ref 7.0–26.0)
CALCIUM: 9 mg/dL (ref 8.4–10.4)
CHLORIDE: 107 meq/L (ref 98–109)
CO2: 28 meq/L (ref 22–29)
CREATININE: 0.7 mg/dL (ref 0.6–1.1)
EGFR: 89 mL/min/{1.73_m2} — ABNORMAL LOW (ref >90)
Glucose: 111 mg/dl (ref 70–140)
Potassium: 4 mEq/L (ref 3.5–5.1)
Sodium: 142 mEq/L (ref 136–145)
TOTAL PROTEIN: 7.6 g/dL (ref 6.4–8.3)

## 2015-07-08 MED ORDER — PROCHLORPERAZINE MALEATE 10 MG PO TABS: 10.0000 mg | ORAL_TABLET | Freq: Four times a day (QID) | ORAL | Status: DC | PRN

## 2015-07-08 MED ORDER — PALONOSETRON HCL INJECTION 0.25 MG/5ML
INTRAVENOUS | Status: AC
  Filled 2015-07-08: qty 5

## 2015-07-08 MED ORDER — SODIUM CHLORIDE 0.9 % IV SOLN
Freq: Once | INTRAVENOUS | Status: AC
  Administered 2015-07-08: 11:00:00 via INTRAVENOUS

## 2015-07-08 MED ORDER — PALONOSETRON HCL INJECTION 0.25 MG/5ML
0.2500 mg | Freq: Once | INTRAVENOUS | Status: AC
  Administered 2015-07-08: 0.25 mg via INTRAVENOUS

## 2015-07-08 MED ORDER — SODIUM CHLORIDE 0.9 % IV SOLN
10.0000 mg | Freq: Once | INTRAVENOUS | Status: AC
  Administered 2015-07-08: 10 mg via INTRAVENOUS
  Filled 2015-07-08: qty 1

## 2015-07-08 MED ORDER — CARBOPLATIN CHEMO INTRADERMAL TEST DOSE 100MCG/0.02ML
100.0000 ug | Freq: Once | INTRADERMAL | Status: AC
  Administered 2015-07-08: 100 ug via INTRADERMAL
  Filled 2015-07-08: qty 0.01

## 2015-07-08 MED ORDER — SODIUM CHLORIDE 0.9 % IJ SOLN
10.0000 mL | INTRAMUSCULAR | Status: DC | PRN
  Administered 2015-07-08: 10 mL via INTRAVENOUS
  Filled 2015-07-08: qty 10

## 2015-07-08 MED ORDER — CARBOPLATIN CHEMO INJECTION 600 MG/60ML
569.0000 mg | Freq: Once | INTRAVENOUS | Status: AC
  Administered 2015-07-08: 570 mg via INTRAVENOUS
  Filled 2015-07-08: qty 57

## 2015-07-08 MED ORDER — SODIUM CHLORIDE 0.9% FLUSH
10.0000 mL | INTRAVENOUS | Status: DC | PRN
  Administered 2015-07-08: 10 mL
  Filled 2015-07-08: qty 10

## 2015-07-08 MED ORDER — HEPARIN SOD (PORK) LOCK FLUSH 100 UNIT/ML IV SOLN
500.0000 [IU] | Freq: Once | INTRAVENOUS | Status: AC | PRN
  Administered 2015-07-08: 500 [IU]
  Filled 2015-07-08: qty 5

## 2015-07-08 NOTE — Patient Instructions (Signed)
[place] Discharge Instructions for Patients Receiving Chemotherapy  Today you received the following chemotherapy agents Carboplatin.  To help prevent nausea and vomiting after your treatment, we encourage you to take your nausea medication as directed - NO ZOFRAN FOR 3 DAYS.  If you develop nausea and vomiting that is not controlled by your nausea medication, call the clinic.   BELOW ARE SYMPTOMS THAT SHOULD BE REPORTED IMMEDIATELY:  *FEVER GREATER THAN 100.5 F  *CHILLS WITH OR WITHOUT FEVER  NAUSEA AND VOMITING THAT IS NOT CONTROLLED WITH YOUR NAUSEA MEDICATION  *UNUSUAL SHORTNESS OF BREATH  *UNUSUAL BRUISING OR BLEEDING  TENDERNESS IN MOUTH AND THROAT WITH OR WITHOUT PRESENCE OF ULCERS  *URINARY PROBLEMS  *BOWEL PROBLEMS  UNUSUAL RASH Items with * indicate a potential emergency and should be followed up as soon as possible.  Feel free to call the clinic you have any questions or concerns. The clinic phone number is [phone].  Please show the [place] at check-in to the Emergency Department and triage nurse.  Carboplatin injection What is this medicine? CARBOPLATIN (KAR boe pla tin) is a chemotherapy drug. It targets fast dividing cells, like cancer cells, and causes these cells to die. This medicine is used to treat ovarian cancer and many other cancers. This medicine may be used for other purposes; ask your health care provider or pharmacist if you have questions. What should I tell my health care provider before I take this medicine? They need to know if you have any of these conditions: -blood disorders -hearing problems -kidney disease -recent or ongoing radiation therapy -an unusual or allergic reaction to carboplatin, cisplatin, other chemotherapy, other medicines, foods, dyes, or preservatives -pregnant or trying to get pregnant -breast-feeding How should I use this medicine? This drug is usually given as an infusion into a vein.  It is administered in a hospital or clinic by a specially trained health care professional. Talk to your pediatrician regarding the use of this medicine in children. Special care may be needed. Overdosage: If you think you have taken too much of this medicine contact a poison control center or emergency room at once. NOTE: This medicine is only for you. Do not share this medicine with others. What if I miss a dose? It is important not to miss a dose. Call your doctor or health care professional if you are unable to keep an appointment. What may interact with this medicine? -medicines for seizures -medicines to increase blood counts like filgrastim, pegfilgrastim, sargramostim -some antibiotics like amikacin, gentamicin, neomycin, streptomycin, tobramycin -vaccines Talk to your doctor or health care professional before taking any of these medicines: -acetaminophen -aspirin -ibuprofen -ketoprofen -naproxen This list may not describe all possible interactions. Give your health care provider a list of all the medicines, herbs, non-prescription drugs, or dietary supplements you use. Also tell them if you smoke, drink alcohol, or use illegal drugs. Some items may interact with your medicine. What should I watch for while using this medicine? Your condition will be monitored carefully while you are receiving this medicine. You will need important blood work done while you are taking this medicine. This drug may make you feel generally unwell. This is not uncommon, as chemotherapy can affect healthy cells as well as cancer cells. Report any side effects. Continue your course of treatment even though you feel ill unless your doctor tells you to stop. In some cases, you may be given additional medicines to help with side effects. Follow all  directions for their use. Call your doctor or health care professional for advice if you get a fever, chills or sore throat, or other symptoms of a cold or flu. Do  not treat yourself. This drug decreases your body's ability to fight infections. Try to avoid being around people who are sick. This medicine may increase your risk to bruise or bleed. Call your doctor or health care professional if you notice any unusual bleeding. Be careful brushing and flossing your teeth or using a toothpick because you may get an infection or bleed more easily. If you have any dental work done, tell your dentist you are receiving this medicine. Avoid taking products that contain aspirin, acetaminophen, ibuprofen, naproxen, or ketoprofen unless instructed by your doctor. These medicines may hide a fever. Do not become pregnant while taking this medicine. Women should inform their doctor if they wish to become pregnant or think they might be pregnant. There is a potential for serious side effects to an unborn child. Talk to your health care professional or pharmacist for more information. Do not breast-feed an infant while taking this medicine. What side effects may I notice from receiving this medicine? Side effects that you should report to your doctor or health care professional as soon as possible: -allergic reactions like skin rash, itching or hives, swelling of the face, lips, or tongue -signs of infection - fever or chills, cough, sore throat, pain or difficulty passing urine -signs of decreased platelets or bleeding - bruising, pinpoint red spots on the skin, black, tarry stools, nosebleeds -signs of decreased red blood cells - unusually weak or tired, fainting spells, lightheadedness -breathing problems -changes in hearing -changes in vision -chest pain -high blood pressure -low blood counts - This drug may decrease the number of white blood cells, red blood cells and platelets. You may be at increased risk for infections and bleeding. -nausea and vomiting -pain, swelling, redness or irritation at the injection site -pain, tingling, numbness in the hands or  feet -problems with balance, talking, walking -trouble passing urine or change in the amount of urine Side effects that usually do not require medical attention (report to your doctor or health care professional if they continue or are bothersome): -hair loss -loss of appetite -metallic taste in the mouth or changes in taste This list may not describe all possible side effects. Call your doctor for medical advice about side effects. You may report side effects to FDA at 1-800-FDA-1088. Where should I keep my medicine? This drug is given in a hospital or clinic and will not be stored at home. NOTE: This sheet is a summary. It may not cover all possible information. If you have questions about this medicine, talk to your doctor, pharmacist, or health care provider.    2016, Elsevier/Gold Standard. (2007-05-13 14:38:05)

## 2015-07-08 NOTE — Telephone Encounter (Signed)
Opened in error

## 2015-07-08 NOTE — Patient Instructions (Signed)

## 2015-07-08 NOTE — Telephone Encounter (Signed)
Gave and printed appt sched and avs for pt for May and June  °

## 2015-07-08 NOTE — Progress Notes (Signed)
Ok to treat with PLT level today per Dr. Alen Blew.

## 2015-07-08 NOTE — Progress Notes (Signed)
Hematology and Oncology Follow Up Visit  [name] [id number] [birth year] 60 y.o. 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 This was diagnosed in July of 2014.   Prior Therapy:  She is status post paracentesis performed on 09/17/2012 with the cytology confirmed the presence of adenocarcinoma and immunohistochemical stains suggest GYN etiology. She is also status post lumpectomy for breast cancer diagnosed 25 years ago followed by radiation and chemotherapy under the care of Dr. Sonny Dandy. She did not receive any hormonal therapy. Chemotherapy utilizing carboplatin and Taxotere started on 10/01/2012. Avastin was added with cycle 4. This was discontinued in June of 2015. She is S/P Avastin maintenance only between June 2015 till March 2017. Therapy discontinued due to progression of disease. She is status post Doxil salvage chemotherapy therapy discontinued because of hypersensitivity reaction in March 2017.  Current therapy:  Carboplatin and AUC of 5 every 3 weeks cycle 1 started on 07/08/2015.  Interim History: [name] presents today for a followup visit with her husband. Since her last visit, she was found to have a left breast mass and underwent a biopsy on 07/07/2015. The results of the biopsies currently pending. She has reported some symptoms of chest discomfort leading up to it but no lumps or lesions. She does report some occasional indigestion and increase in her abdominal girth but not dramatically. She reports that her voice is improving very slightly but not dramatically different. Her appetite remains reasonable without any major weight changes. She does report some occasional nausea and Compazine will be available to her moving forward.   She does not report any headaches, blurry vision, syncope or seizures. She denied any fever chills. She does  report some chest discomfort but no dyspnea on exertion. Has not reported any shortness of breath or cough. She denied any pain with swallowing. She does not report any vomiting, constipation does report some occasional diarrhea. She denied any GYN bleeding. She reports no anxiety or depression. Remainder of the review of systems is unremarkable.  Medications: I have reviewed the patient's current medications.  Current Outpatient Prescriptions  Medication Sig Dispense Refill  . Acetylcarnitine HCl (ACETYL L-CARNITINE) 500 MG CAPS Take 1 capsule by mouth at bedtime.    . ALPRAZolam (XANAX) 1 MG tablet Take 1 mg by mouth at bedtime as needed for anxiety.    . calcium carbonate (TUMS - DOSED IN MG ELEMENTAL CALCIUM) 500 MG chewable tablet Chew 1 tablet by mouth as needed for indigestion or heartburn.    . divalproex (DEPAKOTE) 250 MG DR tablet Take 250 mg by mouth 2 (two) times daily. 500mg  in the morning and 1500 mg in the evening    . gabapentin (NEURONTIN) 100 MG capsule TAKE ONE CAPSULE BY MOUTH THREE TIMES A DAY 90 capsule 1  . lamoTRIgine (LAMICTAL) 150 MG tablet Take 150 mg by mouth at bedtime.  1  . levothyroxine (SYNTHROID, LEVOTHROID) 100 MCG tablet Take 100 mcg by mouth daily before breakfast.     . loperamide (IMODIUM A-D) 2 MG tablet Take 2 mg by mouth as needed for diarrhea or loose stools.    Marland Kitchen omeprazole (PRILOSEC) 40 MG capsule Take 40 mg by mouth.    . ondansetron (ZOFRAN ODT) 8 MG disintegrating tablet Take 1 tablet (8 mg total) by mouth every 8 (eight) hours as needed for nausea or vomiting. 20 tablet 0  .  ondansetron (ZOFRAN) 8 MG tablet TAKE 1 TABLET BY MOUTH EVERY 8 HOURS AS NEEDED FOR NAUSEA AND VOMITING 20 tablet 1  . rOPINIRole (REQUIP) 1 MG tablet Take 1 mg by mouth at bedtime.    . lidocaine-prilocaine (EMLA) cream Apply topically as needed. Apply to port with every chemotherapy. 30 g 1  . prochlorperazine (COMPAZINE) 10 MG tablet Take 1 tablet (10 mg total) by mouth every  6 (six) hours as needed for nausea or vomiting. 30 tablet 1   No current facility-administered medications for this visit.   Facility-Administered Medications Ordered in Other Visits  Medication Dose Route Frequency Provider Last Rate Last Dose  . CARBOplatin (PARAPLATIN) 570 mg in sodium chloride 0.9 % 250 mL chemo infusion  570 mg Intravenous Once Julie Portela, MD      . dexamethasone (DECADRON) 10 mg in sodium chloride 0.9 % 50 mL IVPB  10 mg Intravenous Once Julie Portela, MD      . heparin lock flush 100 unit/mL  500 Units Intravenous Once Julie Portela, MD      . heparin lock flush 100 unit/mL  500 Units Intracatheter Once PRN Julie Portela, MD      . sodium chloride flush (NS) 0.9 % injection 10 mL  10 mL Intravenous PRN Julie Portela, MD      . sodium chloride flush (NS) 0.9 % injection 10 mL  10 mL Intracatheter PRN Julie Portela, MD         Allergies:  Allergies  Allergen Reactions  . Doxil [Doxorubicin Hcl Liposomal] Anaphylaxis    1st Doxil.   . Pollen Extract Other (See Comments)    Pollen and grass causes a lot sneezing    Past Medical History, Surgical history, Social history, and Family History were reviewed and updated.   Physical Exam: Blood pressure 140/86, pulse 72, temperature 96.8 F (36 C), resp. rate 18, height 5\' 2"  (1.575 m), weight 163 lb 9.6 oz (74.208 kg), SpO2 97 %.  ECOG: 1 General appearance: Well-appearing woman without distress. Head: Normocephalic, without obvious abnormality. No oral thrush noted. Neck: no adenopathy Lymph nodes: Cervical, supraclavicular, and axillary nodes normal. Heart:regular rate and rhythm, S1, S2 normal, no murmur, click, rub or gallop Lung:chest clear, no wheezing, rales, normal symmetric air entry Abdomin: Soft, nontender with good bowel sounds. Slightly distended without shifting dullness. EXT:no erythema, induration, or nodules. No edema. Skin: No rashes or lesions.  Lab Results: Lab Results  Component  Value Date   WBC 3.3* 07/08/2015   HGB 11.2* 07/08/2015   HCT 37.0 07/08/2015   MCV 94.6 07/08/2015   PLT 96* 07/08/2015     Chemistry      Component Value Date/Time   NA 142 07/08/2015 0935   NA 144 05/25/2015 0931   NA 143 02/11/2015 1005   K 4.0 07/08/2015 0935   K 4.4 05/25/2015 0931   CL 102 05/25/2015 0931   CO2 28 07/08/2015 0935   CO2 26 05/25/2015 0931   BUN 10.5 07/08/2015 0935   BUN 19 05/25/2015 0931   BUN 16 02/11/2015 1005   CREATININE 0.7 07/08/2015 0935   CREATININE 0.82 05/25/2015 0931      Component Value Date/Time   CALCIUM 9.0 07/08/2015 0935   CALCIUM 8.7 05/25/2015 0931   ALKPHOS 67 07/08/2015 0935   ALKPHOS 58 05/25/2015 0931   AST 15 07/08/2015 0935   AST 23 05/25/2015 0931   ALT 10 07/08/2015 0935   ALT 11  05/25/2015 0931   BILITOT <0.30 07/08/2015 0935   BILITOT 0.2 05/25/2015 0931   BILITOT 0.5 02/11/2015 1005         Impression and Plan:  60 year old woman with the following issues:  1. Peritoneal carcinomatosis with omental involvement. The pathology confirmed the presence of adenocarcinoma of likely GYN etiology. Her CA 125 was elevated at 333.5 on 09/19/2012. She is status post systemic chemotherapy with an excellent response utilizing carboplatin and Taxotere and a Avastin . This therapy was held in June of 2015 and currently on Avastin maintenance.   CT scan obtained on 05/05/2015 as well as her tumor markers showed clear progression. Her CA-125 is up to 125 and her CT scan showed increase in her peritoneal disease. She remains in excellent health and shape and desire continue treatment.  She is ready to proceed with carboplatin salvage therapy as a single agent and will be administered an AUC of 5. Complications associated with this medication including neutropenia, thrombocytopenia, hypersensitivity reaction in addition to nausea and vomiting. It would be reasonable to proceed with this therapy independent of her potential new  breast cancer diagnosis. I do not think delaying her peritoneal disease treatment would be in her best interest.    2. IV access: Port-A-Cath is in place without any issues or complications. This was flushed today.  3. Thrombocytopenia: Her platelets count continues to improve without any bleeding episodes.   4. Abdominal distention: Slight ascites noted related to her carcinomatosis. We will continue to monitor periodically for possible paracentesis.  5. Left breast mass: She status post biopsy with potential malignancy. She does have a history of right breast cancer and peritoneal carcinomatosis. We'll await the results of her biopsy but I anticipate if she does have malignancy that she will undergo mastectomy and lymph node dissection. Additional therapy will be dictated depending on the tumor characteristics.  8. Hypercalcemia: Very mild at this time and asymptomatic. Her calcium is within normal range on 07/08/2015.  9. Genetic counseling: She has been evaluated for possible genetic disorder but none has been identified.  10. The followup: She will in 3 weeks for the second cycle of chemotherapy.    [facility], MD 5/19/201712:02 PM

## 2015-07-08 NOTE — Telephone Encounter (Signed)
-----   Message from Herschell Dimes, RN sent at 07/08/2015  1:21 PM EDT ----- Regarding: [name] - follow up first time [place] Contact: [place] [facility] - Dr Marland Kitchen [name] - tolerated chemo Carboplatin well today for first dose [phone]

## 2015-07-09 ENCOUNTER — Encounter: Payer: Self-pay | Admitting: Oncology

## 2015-07-09 LAB — CA 125: CANCER ANTIGEN (CA) 125: 151.4 U/mL — AB (ref 0.0–38.1)

## 2015-07-13 DIAGNOSIS — C50912 Malignant neoplasm of unspecified site of left female breast: Secondary | ICD-10-CM | POA: Diagnosis not present

## 2015-07-14 ENCOUNTER — Telehealth: Payer: Self-pay | Admitting: Nurse Practitioner

## 2015-07-14 ENCOUNTER — Encounter: Payer: Self-pay | Admitting: Oncology

## 2015-07-14 ENCOUNTER — Other Ambulatory Visit: Payer: Self-pay | Admitting: *Deleted

## 2015-07-14 DIAGNOSIS — C50919 Malignant neoplasm of unspecified site of unspecified female breast: Secondary | ICD-10-CM

## 2015-07-14 DIAGNOSIS — C569 Malignant neoplasm of unspecified ovary: Secondary | ICD-10-CM

## 2015-07-14 NOTE — Telephone Encounter (Signed)
Added apt pt aware per pof °

## 2015-07-14 NOTE — Progress Notes (Signed)
This RN called patient regarding MyChart question. Patient is aware of tomorrow's appointments at [facility]. She will have labs/flush/ [facility]. POF sent to scheduling. Patient verbalized understanding.

## 2015-07-15 ENCOUNTER — Other Ambulatory Visit: Payer: Self-pay

## 2015-07-15 ENCOUNTER — Ambulatory Visit: Payer: PPO

## 2015-07-15 ENCOUNTER — Other Ambulatory Visit (HOSPITAL_BASED_OUTPATIENT_CLINIC_OR_DEPARTMENT_OTHER): Payer: PPO

## 2015-07-15 ENCOUNTER — Encounter: Payer: Self-pay | Admitting: Nurse Practitioner

## 2015-07-15 ENCOUNTER — Ambulatory Visit (HOSPITAL_BASED_OUTPATIENT_CLINIC_OR_DEPARTMENT_OTHER): Payer: PPO | Admitting: Nurse Practitioner

## 2015-07-15 VITALS — BP 124/60 | HR 63 | Temp 97.9°F | Resp 18 | Ht 62.0 in | Wt 163.7 lb

## 2015-07-15 DIAGNOSIS — Z95828 Presence of other vascular implants and grafts: Secondary | ICD-10-CM

## 2015-07-15 DIAGNOSIS — C50919 Malignant neoplasm of unspecified site of unspecified female breast: Secondary | ICD-10-CM | POA: Diagnosis not present

## 2015-07-15 DIAGNOSIS — C569 Malignant neoplasm of unspecified ovary: Secondary | ICD-10-CM | POA: Diagnosis not present

## 2015-07-15 DIAGNOSIS — R0789 Other chest pain: Secondary | ICD-10-CM | POA: Insufficient documentation

## 2015-07-15 DIAGNOSIS — M542 Cervicalgia: Secondary | ICD-10-CM | POA: Diagnosis not present

## 2015-07-15 DIAGNOSIS — C482 Malignant neoplasm of peritoneum, unspecified: Secondary | ICD-10-CM

## 2015-07-15 LAB — CBC WITH DIFFERENTIAL/PLATELET
BASO%: 0.3 % (ref 0.0–2.0)
Basophils Absolute: 0 10*3/uL (ref 0.0–0.1)
EOS ABS: 0.2 10*3/uL (ref 0.0–0.5)
EOS%: 4.6 % (ref 0.0–7.0)
HCT: 34.8 % (ref 34.8–46.6)
HEMOGLOBIN: 10.7 g/dL — AB (ref 11.6–15.9)
LYMPH%: 30.4 % (ref 14.0–49.7)
MCH: 28.7 pg (ref 25.1–34.0)
MCHC: 30.7 g/dL — ABNORMAL LOW (ref 31.5–36.0)
MCV: 93.3 fL (ref 79.5–101.0)
MONO#: 0.5 10*3/uL (ref 0.1–0.9)
MONO%: 13.7 % (ref 0.0–14.0)
NEUT%: 51 % (ref 38.4–76.8)
NEUTROS ABS: 1.9 10*3/uL (ref 1.5–6.5)
PLATELETS: 98 10*3/uL — AB (ref 145–400)
RBC: 3.73 10*6/uL (ref 3.70–5.45)
RDW: 15.1 % — AB (ref 11.2–14.5)
WBC: 3.7 10*3/uL — ABNORMAL LOW (ref 3.9–10.3)
lymph#: 1.1 10*3/uL (ref 0.9–3.3)

## 2015-07-15 LAB — COMPREHENSIVE METABOLIC PANEL
ALBUMIN: 3.2 g/dL — AB (ref 3.5–5.0)
ALT: 10 U/L (ref 0–55)
AST: 18 U/L (ref 5–34)
Alkaline Phosphatase: 79 U/L (ref 40–150)
Anion Gap: 7 mEq/L (ref 3–11)
BILIRUBIN TOTAL: 0.34 mg/dL (ref 0.20–1.20)
BUN: 15.2 mg/dL (ref 7.0–26.0)
CO2: 27 meq/L (ref 22–29)
CREATININE: 0.8 mg/dL (ref 0.6–1.1)
Calcium: 8.9 mg/dL (ref 8.4–10.4)
Chloride: 102 mEq/L (ref 98–109)
EGFR: 84 mL/min/{1.73_m2} — AB (ref >90)
GLUCOSE: 101 mg/dL (ref 70–140)
Potassium: 4.3 mEq/L (ref 3.5–5.1)
SODIUM: 136 meq/L (ref 136–145)
TOTAL PROTEIN: 7.8 g/dL (ref 6.4–8.3)

## 2015-07-15 LAB — URINALYSIS, MICROSCOPIC - CHCC
BILIRUBIN (URINE): NEGATIVE
BLOOD: NEGATIVE
Glucose: NEGATIVE mg/dL
KETONES: NEGATIVE mg/dL
Leukocyte Esterase: NEGATIVE
Nitrite: NEGATIVE
PH: 6 (ref 4.6–8.0)
Protein: NEGATIVE mg/dL
RBC / HPF: NEGATIVE (ref 0–2)
SPECIFIC GRAVITY, URINE: 1.01 (ref 1.003–1.035)
Urobilinogen, UR: 0.2 mg/dL (ref 0.2–1)

## 2015-07-15 MED ORDER — SODIUM CHLORIDE 0.9 % IJ SOLN
10.0000 mL | INTRAMUSCULAR | Status: DC | PRN
  Administered 2015-07-15: 10 mL via INTRAVENOUS
  Filled 2015-07-15: qty 10

## 2015-07-15 MED ORDER — HEPARIN SOD (PORK) LOCK FLUSH 100 UNIT/ML IV SOLN
500.0000 [IU] | Freq: Once | INTRAVENOUS | Status: DC | PRN
  Filled 2015-07-15: qty 5

## 2015-07-15 MED ORDER — HEPARIN SOD (PORK) LOCK FLUSH 100 UNIT/ML IV SOLN
500.0000 [IU] | Freq: Once | INTRAVENOUS | Status: AC | PRN
  Administered 2015-07-15: 500 [IU] via INTRAVENOUS
  Filled 2015-07-15: qty 5

## 2015-07-15 NOTE — Patient Instructions (Signed)

## 2015-07-16 LAB — URINE CULTURE: Organism ID, Bacteria: NO GROWTH

## 2015-07-18 ENCOUNTER — Encounter: Payer: Self-pay | Admitting: Oncology

## 2015-07-20 ENCOUNTER — Encounter: Payer: Self-pay | Admitting: *Deleted

## 2015-07-21 ENCOUNTER — Encounter: Payer: Self-pay | Admitting: Nurse Practitioner

## 2015-07-21 NOTE — Progress Notes (Signed)
SYMPTOM MANAGEMENT CLINIC    Chief Complaint: Chest wall pain  HPI:  [name] 60 y.o. 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    Patient states that she has been experiencing some intermittent chest wall pain to the left upper chest that will radiate up her jaw and down her arm when she awakens each morning for the past month or so.  She states that the pain radiates to her jaw and sometimes to her left arm as well.  She denies having any of the pain at this time.  She denies any chest wall pressure, shortness of breath, or pain with inspiration.  She denies any known injury or trauma to the site.  She states that she has been to her primary care physician for the same complaint; and states that her primary care physician did an EKG at that time and advised the patient that it did not look like there was a cardiac component to this discomfort.  Patient also states that she occasionally has intermittent low back pain that radiates down toward pelvis.  Patient states that she has pulled a tick off of her recently; and is wondering if this could have any thing to do with the new complaints.  She also reports that she has had a temperature up to 98.0-which she feels is high for her.  Exam today reveals patient appears fairly well; and is observed with full range of motion of all upper extremities.  There was no jaw discomfort and had full range of motion with jaw.  Labs obtained today were all essentially within normal limits.  Urinalysis was within normal limits; and urine culture is pending.  EKG obtained today revealed normal sinus rhythm with a rate of 68 and a QTC of 423.  Patient was advised that she should follow-up once again with her primary care physician regarding this chronic chest wall discomfort that she experiences only in the morning.  She was also encouraged to go directly to the emergency department for any  worsening symptoms whatsoever.   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 Negative for chills.  Cardiovascular: Positive for chest pain. Negative for palpitations, orthopnea, claudication, leg swelling and PND.  All other systems reviewed and are negative.   Past Medical History  Diagnosis Date  . Premature menopause   . Osteopenia 03/2009    t score -2.1 FRAX 4.6/0.4  . Hypothyroidism   . Depression   . Anxiety   . Hyperlipidemia   . Bipolar disorder ([place])   . ADD (attention deficit disorder)   . Hematochezia   . Allergy     seasonal  . Maintenance chemotherapy     Pt has chemo every 3 weeks (on Friday)  . Sleep apnea     mild  . Breast cancer ([place]) 1989    right breast  . Diabetes mellitus     pt denies DM noe meds    Past Surgical History  Procedure Laterality Date  . Breast surgery  1989    RIGHT LUMPECTOMY, RADIATION AND CHEMO  . Hysteroscopy  2011    Polyp  . Pelvic laparoscopy/ hysteroscopy  1996  . Foot surgery  2013    BILATERAL     has HYPOTHYROIDISM; DIAB W/O COMP TYPE II/UNS NOT STATED UNCNTRL; HYPERLIPIDEMIA; OBSTRUCTIVE SLEEP APNEA; Internal  hemorrhoids without mention of complication; ALLERGIC RHINITIS; ESOPHAGEAL STRICTURE; MOTOR RESTLESSNESS; NONSPEC ELEVATION OF LEVELS OF TRANSAMINASE/LDH; NONSPECIFIC ABNORMAL ELECTROCARDIOGRAM; BREAST CANCER, HX OF; Premature menopause; Osteopenia; Depression; Anxiety; Bipolar disorder ([place]); ADD (attention deficit disorder); Diabetes mellitus; Lithium use; Abnormal coordination; Malignant neoplasm of female breast ([place]); Primary peritoneal carcinomatosis ([place]); Dizziness; Port catheter in place; Genetic testing; and Chest wall pain on her problem list.    is allergic to doxil and pollen extract.      Medication List       This list is accurate as of: 07/15/15 11:59 PM.  Always use your most recent med list.               Acetyl L-Carnitine 500 MG Caps  Take 1 capsule by mouth at bedtime.     ALPRAZolam 1 MG tablet  Commonly known as:  XANAX  Take 1 mg by mouth at bedtime as needed for anxiety.     calcium carbonate 500 MG chewable tablet  Commonly known as:  TUMS - dosed in mg elemental calcium  Chew 1 tablet by mouth as needed for indigestion or heartburn.     DEPAKOTE 250 MG DR tablet  Generic drug:  divalproex  Take 250 mg by mouth 2 (two) times daily. 555m in the morning and 1500 mg in the evening     gabapentin 100 MG capsule  Commonly known as:  NEURONTIN  TAKE ONE CAPSULE BY MOUTH THREE TIMES A DAY     lamoTRIgine 150 MG tablet  Commonly known as:  LAMICTAL  Take 150 mg by mouth at bedtime.     levothyroxine 100 MCG tablet  Commonly known as:  SYNTHROID, LEVOTHROID  Take 100 mcg by mouth daily before breakfast.     lidocaine-prilocaine cream  Commonly known as:  EMLA  Apply topically as needed. Apply to port with every chemotherapy.     loperamide 2 MG tablet  Commonly known as:  IMODIUM A-D  Take 2 mg by mouth as needed for diarrhea or loose stools.     omeprazole 40 MG capsule  Commonly known as:  PRILOSEC  Take 40 mg by mouth.     ondansetron 8 MG disintegrating tablet  Commonly known as:  ZOFRAN ODT  Take 1 tablet (8 mg total) by mouth every 8 (eight) hours as needed for nausea or vomiting.     ondansetron 8 MG tablet  Commonly known as:  ZOFRAN  TAKE 1 TABLET BY MOUTH EVERY 8 HOURS AS NEEDED FOR NAUSEA AND VOMITING     prochlorperazine 10 MG tablet  Commonly known as:  COMPAZINE  Take 1 tablet (10 mg total) by mouth every 6 (six) hours as needed for nausea or vomiting.     rOPINIRole 1 MG tablet  Commonly known as:  REQUIP  Take 1 mg by mouth at bedtime.         PHYSICAL EXAMINATION  Oncology Vitals 07/15/2015 07/08/2015  Height  158 cm 158 cm  Weight 74.254 kg 74.208 kg  Weight (lbs) 163 lbs 11 oz 163 lbs 10 oz  BMI (kg/m2) 29.94 kg/m2 29.92 kg/m2  Temp 97.9 96.8  Pulse 63 72  Resp 18 18  SpO2 95 97  BSA (m2) 1.8 m2 1.8 m2   BP Readings from Last 2 Encounters:  07/15/15 124/60  07/08/15 140/86    Physical Exam  Constitutional: She is oriented to person, place, and time and well-developed, well-nourished, and in no distress.  HENT:  Head: Normocephalic and atraumatic.  Mouth/Throat: Oropharynx is clear and moist.  Eyes: Conjunctivae and EOM are normal. Pupils are equal, round, and reactive to light. Right eye exhibits no discharge. Left eye exhibits no discharge. No scleral icterus.  Neck: Normal range of motion. Neck supple. No JVD present. No tracheal deviation present. No thyromegaly present.  Cardiovascular: Normal rate, regular rhythm, normal heart sounds and intact distal pulses.   Pulmonary/Chest: Effort normal and breath sounds normal. No respiratory distress. She has no wheezes. She has no rales. She exhibits no tenderness.  Abdominal: Soft. Bowel sounds are normal. She exhibits no distension and no mass. There is no tenderness. There is no rebound and no guarding.  Musculoskeletal: Normal range of motion. She exhibits no edema or tenderness.  Lymphadenopathy:    She has no cervical adenopathy.  Neurological: She is alert and oriented to person, place, and time. Gait normal.  Skin: Skin is warm and dry. No rash noted. No erythema. No pallor.  Psychiatric: Affect normal.  Nursing note and vitals reviewed.   LABORATORY DATA:. Appointment on 07/15/2015  Component Date Value Ref Range Status  . WBC 07/15/2015 3.7* 3.9 - 10.3 10e3/uL Final  . NEUT# 07/15/2015 1.9  1.5 - 6.5 10e3/uL Final  . HGB 07/15/2015 10.7* 11.6 - 15.9 g/dL Final  . HCT 07/15/2015 34.8  34.8 - 46.6 % Final  . Platelets 07/15/2015 98* 145 - 400 10e3/uL Final  . MCV 07/15/2015 93.3  79.5 - 101.0 fL Final  . MCH 07/15/2015 28.7   25.1 - 34.0 pg Final  . MCHC 07/15/2015 30.7* 31.5 - 36.0 g/dL Final  . RBC 07/15/2015 3.73  3.70 - 5.45 10e6/uL Final  . RDW 07/15/2015 15.1* 11.2 - 14.5 % Final  . lymph# 07/15/2015 1.1  0.9 - 3.3 10e3/uL Final  . MONO# 07/15/2015 0.5  0.1 - 0.9 10e3/uL Final  . Eosinophils Absolute 07/15/2015 0.2  0.0 - 0.5 10e3/uL Final  . Basophils Absolute 07/15/2015 0.0  0.0 - 0.1 10e3/uL Final  . NEUT% 07/15/2015 51.0  38.4 - 76.8 % Final  . LYMPH% 07/15/2015 30.4  14.0 - 49.7 % Final  . MONO% 07/15/2015 13.7  0.0 - 14.0 % Final  . EOS% 07/15/2015 4.6  0.0 - 7.0 % Final  . BASO% 07/15/2015 0.3  0.0 - 2.0 % Final  . Sodium 07/15/2015 136  136 - 145 mEq/L Final  . Potassium 07/15/2015 4.3  3.5 - 5.1 mEq/L Final  . Chloride 07/15/2015 102  98 - 109 mEq/L Final  . CO2 07/15/2015 27  22 - 29 mEq/L Final  . Glucose 07/15/2015 101  70 - 140 mg/dl Final   Glucose reference range is for nonfasting patients. Fasting glucose reference range is 70- 100.  Marland Kitchen BUN 07/15/2015 15.2  7.0 - 26.0 mg/dL Final  . Creatinine 07/15/2015 0.8  0.6 - 1.1 mg/dL Final  . Total Bilirubin 07/15/2015 0.34  0.20 - 1.20 mg/dL Final  . Alkaline Phosphatase 07/15/2015 79  40 - 150 U/L Final  . AST 07/15/2015 18  5 - 34 U/L Final  . ALT 07/15/2015 10  0 - 55 U/L Final  . Total Protein 07/15/2015 7.8  6.4 - 8.3 g/dL Final  . Albumin 07/15/2015 3.2* 3.5 - 5.0 g/dL Final  . Calcium 07/15/2015 8.9  8.4 - 10.4 mg/dL Final  . Anion Gap 07/15/2015 7  3 - 11 mEq/L Final  . EGFR 07/15/2015 84* >90 ml/min/1.73 m2 Final   eGFR is calculated using the CKD-EPI Creatinine Equation (2009)  .  Glucose 07/15/2015 Negative  Negative mg/dL Final  . Bilirubin (Urine) 07/15/2015 Negative  Negative Final  . Ketones 07/15/2015 Negative  Negative mg/dL Final  . Specific Gravity, Urine 07/15/2015 1.010  1.003 - 1.035 Final  . Blood 07/15/2015 Negative  Negative Final  . pH 07/15/2015 6.0  4.6 - 8.0 Final  . Protein 07/15/2015 Negative  Negative- <30  mg/dL Final  . Urobilinogen, UR 07/15/2015 0.2  0.2 - 1 mg/dL Final  . Nitrite 07/15/2015 Negative  Negative Final  . Leukocyte Esterase 07/15/2015 Negative  Negative Final  . RBC / HPF 07/15/2015 Negative  0 - 2 Final  . WBC, UA 07/15/2015 0-2  0 - 2 Final  . Bacteria, UA 07/15/2015 Trace  Negative- Trace Final  . Epithelial Cells 07/15/2015 Few  Negative- Few Final  . Urine Culture, Routine 07/15/2015 Final report   Final  . Urine Culture result 1 07/15/2015 No growth   Final   EKG: [name], [name] [id number] 15-Jul-2015 11:39:07 [facility] ROUTINE RECORD Normal sinus rhythm Nonspecific T wave abnormality Abnormal ECG Taller R in lead V1 compared to 2011 Confirmed by [facility] MD, PETER [phone]) on 07/16/2015 1:04:29 PM 2m/s 132mmV _0  8.0 SP2 12SL 237 CID: [id number] Referred by: Confirmed By: PEJenkins RougeD Vent. rate 68 BPM PR interval 124 ms QRS duration 92 ms QT/QTc 398/423 ms P-R-T axes _1 05-Oct-1955 (59 yr) Female Caucasian 62in 182lb Room: Loc:511  RADIOGRAPHIC STUDIES: No results found.  ASSESSMENT/PLAN:    [facility] completed cycle one of her carboplatin chemotherapy regimen on 07/08/2015.  She is scheduled to return for labs, flush, visit, and her next cycle of chemotherapy on 07/29/2015.  Chest wall pain Patient states that she has been experiencing some intermittent chest wall pain to the left upper chest that will radiate up her jaw and down her arm when she awakens each morning for the past month or so.  She states that the pain radiates to her jaw and sometimes to her left arm as well.  She denies having any of the pain at this time.  She denies any chest wall pressure, shortness of breath, or pain with inspiration.  She denies any known injury or trauma to the site.  She states that she has been to her primary care physician for the same complaint; and states that her primary care physician did an  EKG at that time and advised the patient that it did not look like there was a cardiac component to this discomfort.  Patient also states that she occasionally has intermittent low back pain that radiates down toward pelvis.  Patient states that she has pulled a tick off of her recently; and is wondering if this could have any thing to do with the new complaints.  She also reports that she has had a temperature up to 98.0-which she feels is high for her.  Exam today reveals patient appears fairly well; and is observed with full range of motion of all upper extremities.  There was no jaw discomfort and had full range of motion with jaw.  Labs obtained today were all essentially within normal limits.  Urinalysis was within normal limits; and urine culture is pending.  EKG obtained today revealed normal sinus rhythm with a rate of 68 and a QTC of 423.  Patient was advised that she should follow-up once again with her primary care physician regarding this chronic chest wall discomfort that she experiences only in the morning.  She was also encouraged to go directly to the emergency department for any worsening symptoms whatsoever.   Patient stated understanding of all instructions; and was in agreement with this plan of care. The patient knows to call the clinic with any problems, questions or concerns.   Total time spent with patient was 25 minutes;  with greater than 75 percent of that time spent in face to face counseling regarding patient's symptoms,  and coordination of care and follow up.  Disclaimer:This dictation was prepared with Dragon/digital dictation along with [employer]. Any transcriptional errors that result from this process are unintentional.  Drue Second, NP 07/21/2015

## 2015-07-21 NOTE — Assessment & Plan Note (Signed)
Patient states that she has been experiencing some intermittent chest wall pain to the left upper chest that will radiate up her jaw and down her arm when she awakens each morning for the past month or so.  She states that the pain radiates to her jaw and sometimes to her left arm as well.  She denies having any of the pain at this time.  She denies any chest wall pressure, shortness of breath, or pain with inspiration.  She denies any known injury or trauma to the site.  She states that she has been to her primary care physician for the same complaint; and states that her primary care physician did an EKG at that time and advised the patient that it did not look like there was a cardiac component to this discomfort.  Patient also states that she occasionally has intermittent low back pain that radiates down toward pelvis.  Patient states that she has pulled a tick off of her recently; and is wondering if this could have any thing to do with the new complaints.  She also reports that she has had a temperature up to 98.0-which she feels is high for her.  Exam today reveals patient appears fairly well; and is observed with full range of motion of all upper extremities.  There was no jaw discomfort and had full range of motion with jaw.  Labs obtained today were all essentially within normal limits.  Urinalysis was within normal limits; and urine culture is pending.  EKG obtained today revealed normal sinus rhythm with a rate of 68 and a QTC of 423.  Patient was advised that she should follow-up once again with her primary care physician regarding this chronic chest wall discomfort that she experiences only in the morning.  She was also encouraged to go directly to the emergency department for any worsening symptoms whatsoever.

## 2015-07-21 NOTE — Assessment & Plan Note (Signed)
Patient completed cycle one of her carboplatin chemotherapy regimen on 07/08/2015.  She is scheduled to return for labs, flush, visit, and her next cycle of chemotherapy on 07/29/2015.

## 2015-07-25 DIAGNOSIS — E039 Hypothyroidism, unspecified: Secondary | ICD-10-CM | POA: Diagnosis not present

## 2015-07-29 ENCOUNTER — Telehealth: Payer: Self-pay | Admitting: Oncology

## 2015-07-29 ENCOUNTER — Ambulatory Visit (HOSPITAL_BASED_OUTPATIENT_CLINIC_OR_DEPARTMENT_OTHER): Payer: PPO

## 2015-07-29 ENCOUNTER — Telehealth: Payer: Self-pay | Admitting: *Deleted

## 2015-07-29 ENCOUNTER — Other Ambulatory Visit (HOSPITAL_BASED_OUTPATIENT_CLINIC_OR_DEPARTMENT_OTHER): Payer: PPO

## 2015-07-29 ENCOUNTER — Ambulatory Visit (HOSPITAL_BASED_OUTPATIENT_CLINIC_OR_DEPARTMENT_OTHER): Payer: PPO | Admitting: Oncology

## 2015-07-29 ENCOUNTER — Ambulatory Visit: Payer: PPO

## 2015-07-29 VITALS — BP 133/75 | HR 71 | Temp 97.9°F | Resp 18 | Ht 62.0 in | Wt 166.1 lb

## 2015-07-29 DIAGNOSIS — C801 Malignant (primary) neoplasm, unspecified: Secondary | ICD-10-CM | POA: Diagnosis not present

## 2015-07-29 DIAGNOSIS — C482 Malignant neoplasm of peritoneum, unspecified: Secondary | ICD-10-CM

## 2015-07-29 DIAGNOSIS — C50912 Malignant neoplasm of unspecified site of left female breast: Secondary | ICD-10-CM

## 2015-07-29 DIAGNOSIS — C569 Malignant neoplasm of unspecified ovary: Secondary | ICD-10-CM | POA: Diagnosis not present

## 2015-07-29 DIAGNOSIS — Z95828 Presence of other vascular implants and grafts: Secondary | ICD-10-CM

## 2015-07-29 DIAGNOSIS — Z5111 Encounter for antineoplastic chemotherapy: Secondary | ICD-10-CM

## 2015-07-29 DIAGNOSIS — C50919 Malignant neoplasm of unspecified site of unspecified female breast: Secondary | ICD-10-CM

## 2015-07-29 DIAGNOSIS — C786 Secondary malignant neoplasm of retroperitoneum and peritoneum: Secondary | ICD-10-CM

## 2015-07-29 LAB — COMPREHENSIVE METABOLIC PANEL
ALT: 11 U/L (ref 0–55)
AST: 17 U/L (ref 5–34)
Albumin: 3.5 g/dL (ref 3.5–5.0)
Alkaline Phosphatase: 73 U/L (ref 40–150)
Anion Gap: 7 mEq/L (ref 3–11)
BUN: 17.3 mg/dL (ref 7.0–26.0)
CO2: 28 meq/L (ref 22–29)
CREATININE: 0.8 mg/dL (ref 0.6–1.1)
Calcium: 9.2 mg/dL (ref 8.4–10.4)
Chloride: 104 mEq/L (ref 98–109)
EGFR: 84 mL/min/{1.73_m2} — ABNORMAL LOW (ref >90)
Glucose: 98 mg/dl (ref 70–140)
Potassium: 4.5 mEq/L (ref 3.5–5.1)
Sodium: 139 mEq/L (ref 136–145)
Total Protein: 8.4 g/dL — ABNORMAL HIGH (ref 6.4–8.3)

## 2015-07-29 LAB — CBC WITH DIFFERENTIAL/PLATELET
BASO%: 0.8 % (ref 0.0–2.0)
BASOS ABS: 0 10*3/uL (ref 0.0–0.1)
EOS%: 3.4 % (ref 0.0–7.0)
Eosinophils Absolute: 0.1 10*3/uL (ref 0.0–0.5)
HEMATOCRIT: 33.8 % — AB (ref 34.8–46.6)
HEMOGLOBIN: 10.6 g/dL — AB (ref 11.6–15.9)
LYMPH#: 1.2 10*3/uL (ref 0.9–3.3)
LYMPH%: 44.5 % (ref 14.0–49.7)
MCH: 28.5 pg (ref 25.1–34.0)
MCHC: 31.2 g/dL — AB (ref 31.5–36.0)
MCV: 91.2 fL (ref 79.5–101.0)
MONO#: 0.5 10*3/uL (ref 0.1–0.9)
MONO%: 18.1 % — ABNORMAL HIGH (ref 0.0–14.0)
NEUT#: 0.9 10*3/uL — ABNORMAL LOW (ref 1.5–6.5)
NEUT%: 33.2 % — AB (ref 38.4–76.8)
PLATELETS: 105 10*3/uL — AB (ref 145–400)
RBC: 3.71 10*6/uL (ref 3.70–5.45)
RDW: 17 % — AB (ref 11.2–14.5)
WBC: 2.8 10*3/uL — ABNORMAL LOW (ref 3.9–10.3)

## 2015-07-29 MED ORDER — PALONOSETRON HCL INJECTION 0.25 MG/5ML
0.2500 mg | Freq: Once | INTRAVENOUS | Status: AC
  Administered 2015-07-29: 0.25 mg via INTRAVENOUS

## 2015-07-29 MED ORDER — SODIUM CHLORIDE 0.9 % IV SOLN
570.0000 mg | Freq: Once | INTRAVENOUS | Status: AC
  Administered 2015-07-29: 570 mg via INTRAVENOUS
  Filled 2015-07-29: qty 57

## 2015-07-29 MED ORDER — SODIUM CHLORIDE 0.9 % IJ SOLN
10.0000 mL | INTRAMUSCULAR | Status: DC | PRN
  Administered 2015-07-29: 10 mL via INTRAVENOUS
  Filled 2015-07-29: qty 10

## 2015-07-29 MED ORDER — HEPARIN SOD (PORK) LOCK FLUSH 100 UNIT/ML IV SOLN
500.0000 [IU] | Freq: Once | INTRAVENOUS | Status: AC | PRN
  Administered 2015-07-29: 500 [IU]
  Filled 2015-07-29: qty 5

## 2015-07-29 MED ORDER — SODIUM CHLORIDE 0.9 % IV SOLN
10.0000 mg | Freq: Once | INTRAVENOUS | Status: AC
  Administered 2015-07-29: 10 mg via INTRAVENOUS
  Filled 2015-07-29: qty 1

## 2015-07-29 MED ORDER — SODIUM CHLORIDE 0.9 % IV SOLN
Freq: Once | INTRAVENOUS | Status: AC
  Administered 2015-07-29: 13:00:00 via INTRAVENOUS

## 2015-07-29 MED ORDER — CARBOPLATIN CHEMO INTRADERMAL TEST DOSE 100MCG/0.02ML
100.0000 ug | Freq: Once | INTRADERMAL | Status: AC
  Administered 2015-07-29: 100 ug via INTRADERMAL
  Filled 2015-07-29: qty 0.01

## 2015-07-29 MED ORDER — SODIUM CHLORIDE 0.9% FLUSH
10.0000 mL | INTRAVENOUS | Status: DC | PRN
  Administered 2015-07-29: 10 mL
  Filled 2015-07-29: qty 10

## 2015-07-29 MED ORDER — PALONOSETRON HCL INJECTION 0.25 MG/5ML
INTRAVENOUS | Status: AC
  Filled 2015-07-29: qty 5

## 2015-07-29 NOTE — Patient Instructions (Signed)
[facility] Discharge Instructions for Patients Receiving Chemotherapy  Today you received the following chemotherapy agents Carboplatin.  To help prevent nausea and vomiting after your treatment, we encourage you to take your nausea medication a directed.    If you develop nausea and vomiting that is not controlled by your nausea medication, call the clinic.   BELOW ARE SYMPTOMS THAT SHOULD BE REPORTED IMMEDIATELY:  *FEVER GREATER THAN 100.5 F  *CHILLS WITH OR WITHOUT FEVER  NAUSEA AND VOMITING THAT IS NOT CONTROLLED WITH YOUR NAUSEA MEDICATION  *UNUSUAL SHORTNESS OF BREATH  *UNUSUAL BRUISING OR BLEEDING  TENDERNESS IN MOUTH AND THROAT WITH OR WITHOUT PRESENCE OF ULCERS  *URINARY PROBLEMS  *BOWEL PROBLEMS  UNUSUAL RASH Items with * indicate a potential emergency and should be followed up as soon as possible.  Feel free to call the clinic you have any questions or concerns. The clinic phone number is [phone].  Please show the [facility] at check-in to the Emergency Department and triage nurse.

## 2015-07-29 NOTE — Telephone Encounter (Signed)
Gave and pritned appt sched and avs for pt for June and July °

## 2015-07-29 NOTE — Progress Notes (Signed)
Hematology and Oncology Follow Up Visit  [name] [id number] 1955-10-15 60 y.o. 07/29/2015 12:44 PM    Principle Diagnosis: 60 year old woman diagnosed with:  1. 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 This was diagnosed in July of 2014. 2. 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 The tumor is ER positive PR negative, HER-2 negative.   Prior Therapy:  She is status post paracentesis performed on 09/17/2012 with the cytology confirmed the presence of adenocarcinoma and immunohistochemical stains suggest GYN etiology. She is also status post lumpectomy for breast cancer diagnosed 25 years ago followed by radiation and chemotherapy under the care of Dr. Sonny Dandy. She did not receive any hormonal therapy. Chemotherapy utilizing carboplatin and Taxotere started on 10/01/2012. Avastin was added with cycle 4. This was discontinued in June of 2015. She is S/P Avastin maintenance only between June 2015 till March 2017. Therapy discontinued due to progression of disease. She is status post Doxil salvage chemotherapy therapy discontinued because of hypersensitivity reaction in March 2017.  Current therapy:  Carboplatin and AUC of 5 every 3 weeks cycle 1 started on 07/08/2015. She is here for cycle 2 of therapy.  Interim History: [name] presents today for a followup visit with her husband. Since her last visit, she tolerated therapy well without any major complications. She denied any side effects associated with chemotherapy. She denied any nausea, vomiting or GI toxicities. She does report some occasional nausea and Compazine will be available to her moving forward. She does report some occasional abdominal distention and on satiety.  She was evaluated by Dr. Excell Seltzer for her breast cancer and for the time being deferred surgery. She denied any breast masses  or tenderness. She denied any axillary lumps or lesions.   She does not report any headaches, blurry vision, syncope or seizures. She denied any fever chills. She does report some chest discomfort but no dyspnea on exertion. Has not reported any shortness of breath or cough. She denied any pain with swallowing. She does not report any vomiting, constipation does report some occasional diarrhea. She denied any GYN bleeding. She reports no anxiety or depression. Remainder of the review of systems is unremarkable.  Medications: I have reviewed the patient's current medications.  Current Outpatient Prescriptions  Medication Sig Dispense Refill  . Acetylcarnitine HCl (ACETYL L-CARNITINE) 500 MG CAPS Take 1 capsule by mouth at bedtime.    . ALPRAZolam (XANAX) 1 MG tablet Take 1 mg by mouth at bedtime as needed for anxiety.    . calcium carbonate (TUMS - DOSED IN MG ELEMENTAL CALCIUM) 500 MG chewable tablet Chew 1 tablet by mouth as needed for indigestion or heartburn.    . divalproex (DEPAKOTE) 250 MG DR tablet Take 250 mg by mouth 2 (two) times daily. 547m in the morning and 1500 mg in the evening    . gabapentin (NEURONTIN) 100 MG capsule TAKE ONE CAPSULE BY MOUTH THREE TIMES A DAY 90 capsule 1  . lamoTRIgine (LAMICTAL) 150 MG tablet Take 150 mg by mouth at bedtime.  1  . levothyroxine (SYNTHROID, LEVOTHROID) 100 MCG tablet Take 100 mcg by mouth daily before breakfast.     . lidocaine-prilocaine (EMLA) cream Apply topically as needed. Apply to port with every chemotherapy. 30 g 1  . loperamide (IMODIUM A-D) 2 MG tablet Take 2 mg by mouth as needed for diarrhea or loose stools.    .Marland Kitchen  omeprazole (PRILOSEC) 40 MG capsule Take 40 mg by mouth.    . ondansetron (ZOFRAN ODT) 8 MG disintegrating tablet Take 1 tablet (8 mg total) by mouth every 8 (eight) hours as needed for nausea or vomiting. 20 tablet 0  . ondansetron (ZOFRAN) 8 MG tablet TAKE 1 TABLET BY MOUTH EVERY 8 HOURS AS NEEDED FOR NAUSEA AND VOMITING  20 tablet 1  . prochlorperazine (COMPAZINE) 10 MG tablet Take 1 tablet (10 mg total) by mouth every 6 (six) hours as needed for nausea or vomiting. 30 tablet 1  . rOPINIRole (REQUIP) 1 MG tablet Take 1 mg by mouth at bedtime.     No current facility-administered medications for this visit.   Facility-Administered Medications Ordered in Other Visits  Medication Dose Route Frequency Provider Last Rate Last Dose  . CARBOplatin CHEMO intradermal Test Dose 100 mcg/0.48m  100 mcg Intradermal Once FWyatt Portela MD      . heparin lock flush 100 unit/mL  500 Units Intravenous Once FWyatt Portela MD      . sodium chloride flush (NS) 0.9 % injection 10 mL  10 mL Intravenous PRN FWyatt Portela MD         Allergies:  Allergies  Allergen Reactions  . Doxil [Doxorubicin Hcl Liposomal] Anaphylaxis    1st Doxil.   . Pollen Extract Other (See Comments)    Pollen and grass causes a lot sneezing    Past Medical History, Surgical history, Social history, and Family History were reviewed and updated.   Physical Exam: Blood pressure 133/75, pulse 71, temperature 97.9 F (36.6 C), temperature source Oral, resp. rate 18, height _0  (1.575 m), weight 166 lb 1.6 oz (75.342 kg), SpO2 100 %.  ECOG: 1 General appearance: Pleasant-appearing woman without distress. Head: Normocephalic, without obvious abnormality. No oral ulcers or lesions. Neck: no adenopathy Lymph nodes: Cervical, supraclavicular, and axillary nodes normal. Heart:regular rate and rhythm, S1, S2 normal, no murmur, click, rub or gallop Lung:chest clear, no wheezing, rales, normal symmetric air entry Abdomin: Soft, nontender with good bowel sounds. Slightly distended without rebound or guarding. EXT:no erythema, induration, or nodules. No edema. Skin: No rashes or lesions.  Lab Results: Lab Results  Component Value Date   WBC 2.8* 07/29/2015   HGB 10.6* 07/29/2015   HCT 33.8* 07/29/2015   MCV 91.2 07/29/2015   PLT 105*  07/29/2015     Chemistry      Component Value Date/Time   NA 139 07/29/2015 1116   NA 144 05/25/2015 0931   NA 143 02/11/2015 1005   K 4.5 07/29/2015 1116   K 4.4 05/25/2015 0931   CL 102 05/25/2015 0931   CO2 28 07/29/2015 1116   CO2 26 05/25/2015 0931   BUN 17.3 07/29/2015 1116   BUN 19 05/25/2015 0931   BUN 16 02/11/2015 1005   CREATININE 0.8 07/29/2015 1116   CREATININE 0.82 05/25/2015 0931      Component Value Date/Time   CALCIUM 9.2 07/29/2015 1116   CALCIUM 8.7 05/25/2015 0931   ALKPHOS 73 07/29/2015 1116   ALKPHOS 58 05/25/2015 0931   AST 17 07/29/2015 1116   AST 23 05/25/2015 0931   ALT 11 07/29/2015 1116   ALT 11 05/25/2015 0931   BILITOT <0.30 07/29/2015 1116   BILITOT 0.2 05/25/2015 0931   BILITOT 0.5 02/11/2015 1005         Impression and Plan:  60year old woman with the following issues:  1. Peritoneal carcinomatosis with omental involvement. The pathology  confirmed the presence of adenocarcinoma of likely GYN etiology. Her CA 125 was elevated at 333.5 on 09/19/2012. She is status post systemic chemotherapy with an excellent response utilizing carboplatin and Taxotere and a Avastin . This therapy was held in June of 2015 and currently on Avastin maintenance.   CT scan obtained on 05/05/2015 as well as her tumor markers showed clear progression. Her CA-125 is up to 125 and her CT scan showed increase in her peritoneal disease.   She is currently receiving salvage therapy with carboplatin single agent at AUC of 5 and here for cycle 2 of therapy. The plan is to proceed with the same dose and schedule and repeat imaging studies after cycle 4.    2. IV access: Port-A-Cath is in place without any issues and will be used for chemotherapy moving forward.  3. Thrombocytopenia: Her platelets count continues to improve without any bleeding episodes.   4. Abdominal distention: No ascites noted on ultrasound unlikely related to peritoneal  carcinomatosis.  5. Left breast mass: She status post biopsy with the pathology indicating T2 N1 disease with the tumor have ER positive, PR negative HER-2 negative. Her Ki-67 is 50%.  Her case was discussed in the breast cancer multidisciplinary conference and the consensus at this time is to treat her ovarian cancer relapse initially. Single agent carboplatin should help as a neoadjuvant therapy for her breast cancer as well. Once her disease have stabilized in the abdomen, salvage mastectomy and lymph node dissection will be pursued in 3-6 months later. Of her disease becomes more progressive in the peritoneal area, primary treatment for her breast cancer will be deferred later.  8. Neutropenia: Neulasta will be added to each cycle of therapy. Complications associated with this therapy was discussed including arthralgias, myalgias and instructions to use Claritin was given to the patient.  9. Genetic counseling: She has been evaluated for possible genetic disorder but none has been identified.  10. The followup: She will in 3 weeks for the next cycle of therapy.    Zola Button, MD 6/9/201712:44 PM

## 2015-07-29 NOTE — Telephone Encounter (Signed)
Per dr Alen Blew, treat today despite labs.

## 2015-07-29 NOTE — Patient Instructions (Signed)

## 2015-07-30 LAB — CA 125: Cancer Antigen (CA) 125: 189.8 U/mL — ABNORMAL HIGH (ref 0.0–38.1)

## 2015-08-01 ENCOUNTER — Ambulatory Visit (HOSPITAL_BASED_OUTPATIENT_CLINIC_OR_DEPARTMENT_OTHER): Payer: PPO

## 2015-08-01 VITALS — BP 135/84 | HR 79 | Temp 98.1°F | Resp 20

## 2015-08-01 DIAGNOSIS — Z5189 Encounter for other specified aftercare: Secondary | ICD-10-CM | POA: Diagnosis not present

## 2015-08-01 DIAGNOSIS — C801 Malignant (primary) neoplasm, unspecified: Secondary | ICD-10-CM

## 2015-08-01 DIAGNOSIS — C786 Secondary malignant neoplasm of retroperitoneum and peritoneum: Secondary | ICD-10-CM | POA: Diagnosis not present

## 2015-08-01 DIAGNOSIS — C482 Malignant neoplasm of peritoneum, unspecified: Secondary | ICD-10-CM

## 2015-08-01 MED ORDER — PEGFILGRASTIM INJECTION 6 MG/0.6ML ~~LOC~~
6.0000 mg | PREFILLED_SYRINGE | Freq: Once | SUBCUTANEOUS | Status: AC
  Administered 2015-08-01: 6 mg via SUBCUTANEOUS
  Filled 2015-08-01: qty 0.6

## 2015-08-01 NOTE — Patient Instructions (Signed)
Pegfilgrastim injection What is this medicine? PEGFILGRASTIM (PEG fil gra stim) is a long-acting granulocyte colony-stimulating factor that stimulates the growth of neutrophils, a type of white blood cell important in the body's fight against infection. It is used to reduce the incidence of fever and infection in patients with certain types of cancer who are receiving chemotherapy that affects the bone marrow, and to increase survival after being exposed to high doses of radiation. This medicine may be used for other purposes; ask your health care provider or pharmacist if you have questions. What should I tell my health care provider before I take this medicine? They need to know if you have any of these conditions: -kidney disease -latex allergy -ongoing radiation therapy -sickle cell disease -skin reactions to acrylic adhesives (On-Body Injector only) -an unusual or allergic reaction to pegfilgrastim, filgrastim, other medicines, foods, dyes, or preservatives -pregnant or trying to get pregnant -breast-feeding How should I use this medicine? This medicine is for injection under the skin. If you get this medicine at home, you will be taught how to prepare and give the pre-filled syringe or how to use the On-body Injector. Refer to the patient Instructions for Use for detailed instructions. Use exactly as directed. Take your medicine at regular intervals. Do not take your medicine more often than directed. It is important that you put your used needles and syringes in a special sharps container. Do not put them in a trash can. If you do not have a sharps container, call your pharmacist or healthcare provider to get one. Talk to your pediatrician regarding the use of this medicine in children. While this drug may be prescribed for selected conditions, precautions do apply. Overdosage: If you think you have taken too much of this medicine contact a poison control center or emergency room at  once. NOTE: This medicine is only for you. Do not share this medicine with others. What if I miss a dose? It is important not to miss your dose. Call your doctor or health care professional if you miss your dose. If you miss a dose due to an On-body Injector failure or leakage, a new dose should be administered as soon as possible using a single prefilled syringe for manual use. What may interact with this medicine? Interactions have not been studied. Give your health care provider a list of all the medicines, herbs, non-prescription drugs, or dietary supplements you use. Also tell them if you smoke, drink alcohol, or use illegal drugs. Some items may interact with your medicine. This list may not describe all possible interactions. Give your health care provider a list of all the medicines, herbs, non-prescription drugs, or dietary supplements you use. Also tell them if you smoke, drink alcohol, or use illegal drugs. Some items may interact with your medicine. What should I watch for while using this medicine? You may need blood work done while you are taking this medicine. If you are going to need a MRI, CT scan, or other procedure, tell your doctor that you are using this medicine (On-Body Injector only). What side effects may I notice from receiving this medicine? Side effects that you should report to your doctor or health care professional as soon as possible: -allergic reactions like skin rash, itching or hives, swelling of the face, lips, or tongue -dizziness -fever -pain, redness, or irritation at site where injected -pinpoint red spots on the skin -red or dark-brown urine -shortness of breath or breathing problems -stomach or side pain, or pain   at the shoulder -swelling -tiredness -trouble passing urine or change in the amount of urine Side effects that usually do not require medical attention (report to your doctor or health care professional if they continue or are  bothersome): -bone pain -muscle pain This list may not describe all possible side effects. Call your doctor for medical advice about side effects. You may report side effects to FDA at 1-800-FDA-1088. Where should I keep my medicine? Keep out of the reach of children. Store pre-filled syringes in a refrigerator between 2 and 8 degrees C (36 and 46 degrees F). Do not freeze. Keep in carton to protect from light. Throw away this medicine if it is left out of the refrigerator for more than 48 hours. Throw away any unused medicine after the expiration date. NOTE: This sheet is a summary. It may not cover all possible information. If you have questions about this medicine, talk to your doctor, pharmacist, or health care provider.    2016, Elsevier/Gold Standard. (2014-02-25 14:30:14)  

## 2015-08-02 ENCOUNTER — Other Ambulatory Visit: Payer: Self-pay | Admitting: Oncology

## 2015-08-02 NOTE — Progress Notes (Signed)
Cardiology Office Note   Date:  08/03/2015   ID:  [name], DOB [birth year], MRN [id number]  PCP:  London Pepper, MD  Cardiologist:   Dorris Carnes, MD   Pt referred for CP by Darcus Austin      History of Present Illness: [name] is a 60 y.o. female with a history of Chest pain  Seen by [facility] in May  Discomfort started 2 to 3 wks prior  Intermittent  Sharp  Goes away on own  Not associated with activity  No SOB  Discomfort is worse when she wakes up in the morning   Pt being treated for peritoneal adenocarcinoma    REcently diagnosed with L breast cancer (tender)   Pt seen in ER on 3/31 after allergic RXN to  Doxil  Back pain, flushing, chest pain, hypotension.  Has not received since    Echo in March 2017 LVEF was 55 to 60%  Occaisonal dizzy  One syncopal spell in past   ? 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 Acetylcarnitine HCl (ACETYL L-CARNITINE) 500 MG CAPS Take 1 capsule by mouth at bedtime.    . ALPRAZolam (XANAX) 1 MG tablet Take 1 mg by mouth at bedtime as needed for anxiety.    . divalproex (DEPAKOTE) 250 MG DR tablet Take 250 mg by mouth 2 (two) times daily. 500mg  in the morning and 1500 mg in the evening    . gabapentin (NEURONTIN) 100 MG capsule TAKE ONE CAPSULE BY MOUTH THREE TIMES A DAY 90 capsule 1  . lamoTRIgine (LAMICTAL) 150 MG tablet Take 150 mg by mouth at bedtime.  1  . levothyroxine (SYNTHROID, LEVOTHROID) 100 MCG tablet Take 100 mcg by mouth daily before breakfast.     . lidocaine-prilocaine (EMLA) cream Apply topically as needed. Apply to port with every chemotherapy. 30 g 1  . loperamide (IMODIUM A-D) 2 MG tablet Take 2 mg by mouth as needed for diarrhea or loose stools.    Marland Kitchen omeprazole (PRILOSEC) 40 MG capsule Take 40 mg by mouth.    . ondansetron (ZOFRAN ODT) 8 MG disintegrating tablet Take 1 tablet  (8 mg total) by mouth every 8 (eight) hours as needed for nausea or vomiting. 20 tablet 0  . ondansetron (ZOFRAN) 8 MG tablet TAKE 1 TABLET BY MOUTH EVERY 8 HOURS AS NEEDED FOR NAUSEA AND VOMITING 20 tablet 1  . prochlorperazine (COMPAZINE) 10 MG tablet Take 1 tablet (10 mg total) by mouth every 6 (six) hours as needed for nausea or vomiting. 30 tablet 1  . rOPINIRole (REQUIP) 1 MG tablet Take 1 mg by mouth at bedtime.    . calcium carbonate (TUMS - DOSED IN MG ELEMENTAL CALCIUM) 500 MG chewable tablet Chew 1 tablet by mouth as needed for indigestion or heartburn. Reported on 08/03/2015    . gabapentin (NEURONTIN) 100 MG capsule TAKE ONE CAPSULE BY MOUTH THREE TIMES A DAY (Patient not taking: Reported on 08/03/2015) 90 capsule 1   Facility-Administered Medications Prior to Visit  Medication Dose Route Frequency Provider Last Rate Last Dose  . heparin lock flush 100 unit/mL  500 Units Intravenous Once Wyatt Portela, MD      . sodium chloride flush (NS) 0.9 % injection 10 mL  10 mL Intravenous PRN Wyatt Portela, MD  Allergies:   Doxil and Pollen extract   Past Medical History  Diagnosis Date  . Premature menopause   . Osteopenia 03/2009    t score -2.1 FRAX 4.6/0.4  . Hypothyroidism   . Depression   . Anxiety   . Hyperlipidemia   . Bipolar disorder ([place])   . ADD (attention deficit disorder)   . Hematochezia   . Allergy     seasonal  . Maintenance chemotherapy     Pt has chemo every 3 weeks (on Friday)  . Sleep apnea     mild  . Breast cancer ([place]) 1989    right breast  . Diabetes mellitus     pt denies DM noe meds    Past Surgical History  Procedure Laterality Date  . Breast surgery  1989    RIGHT LUMPECTOMY, RADIATION AND CHEMO  . Hysteroscopy  2011    Polyp  . Pelvic laparoscopy/ hysteroscopy  1996  . Foot surgery  2013    BILATERAL      Social History:  The patient  reports that she has never smoked. She has never used smokeless tobacco. She reports that  she drinks alcohol. She reports that she does not use illicit drugs.   Family History:  The patient's family history includes Allergies in her father; Breast cancer in her maternal aunt and maternal aunt; Breast cancer (age of onset: 21) in her maternal aunt; Cancer in her cousin and cousin; Diabetes in her maternal grandmother; Heart Problems in her paternal grandfather; Heart disease in her maternal grandfather, maternal grandmother, and mother; Hypertension in her paternal grandfather; Liver cancer (age of onset: 50) in her cousin; Other in her mother. There is no history of Ovarian cancer or Colon cancer.    ROS:  Please see the history of present illness. All other systems are reviewed and  Negative to the above problem except as noted.    PHYSICAL EXAM: VS:  BP 118/60 mmHg  Pulse 88  Ht 5\' 2"  (1.575 m)  Wt 165 lb 1.9 oz (74.898 kg)  BMI 30.19 kg/m2  GEN: Well nourished, well developed, in no acute distress HEENT: normal Neck: no JVD, carotid bruits, or masses Cardiac: RRR; no murmurs, rubs, or gallops,no edema  Chest Tender  Different from chest pain she has experienced in AM  Respiratory:  clear to auscultation bilaterally, normal work of breathing GI: soft, nontender, nondistended, + BS  No hepatomegaly  MS: no deformity Moving all extremities   Skin: warm and dry, no rash Neuro:  Strength and sensation are intact Psych: euthymic mood, full affect   EKG:  EKG is not ordered today.  ON 5/26 SR 68 bpm     Lipid Panel    Component Value Date/Time   CHOL 230* 08/24/2009 0803   TRIG 165.0* 08/24/2009 0803   HDL 59.60 08/24/2009 0803   CHOLHDL 4 08/24/2009 0803   VLDL 33.0 08/24/2009 0803   LDLCALC 101* 07/07/2007 0000   LDLDIRECT 151.3 08/24/2009 0803      Wt Readings from Last 3 Encounters:  08/03/15 165 lb 1.9 oz (74.898 kg)  07/29/15 166 lb 1.6 oz (75.342 kg)  07/15/15 163 lb 11.2 oz (74.254 kg)      ASSESSMENT AND PLAN:  1  CP  I do not think cardiac in  origin  ? Musculoskeletal   ? Related to neck problems  Worse when gets out of bed in AM  Then goes awy Consided eval for myofascial work.  Name of  A [place] given  2  HL  Watch diet.  3.  Onc  Followed in Hem/Onc clinic  She did not complete Doxil inffusion due to Rxn I would not resched echo for now.     F?U as needed  Signed, Dorris Carnes, MD  08/03/2015 2:11 PM    [facility] [address], [place], [place]  [address] Phone: [phone]; Fax: [phone]

## 2015-08-03 ENCOUNTER — Encounter: Payer: Self-pay | Admitting: Internal Medicine

## 2015-08-03 ENCOUNTER — Ambulatory Visit (INDEPENDENT_AMBULATORY_CARE_PROVIDER_SITE_OTHER): Payer: PPO | Admitting: Internal Medicine

## 2015-08-03 VITALS — BP 118/60 | HR 88 | Ht 62.0 in | Wt 165.1 lb

## 2015-08-03 DIAGNOSIS — R079 Chest pain, unspecified: Secondary | ICD-10-CM | POA: Diagnosis not present

## 2015-08-05 DIAGNOSIS — Z862 Personal history of diseases of the blood and blood-forming organs and certain disorders involving the immune mechanism: Secondary | ICD-10-CM | POA: Diagnosis not present

## 2015-08-05 DIAGNOSIS — G2581 Restless legs syndrome: Secondary | ICD-10-CM | POA: Diagnosis not present

## 2015-08-05 DIAGNOSIS — R790 Abnormal level of blood mineral: Secondary | ICD-10-CM | POA: Diagnosis not present

## 2015-08-19 ENCOUNTER — Other Ambulatory Visit (HOSPITAL_BASED_OUTPATIENT_CLINIC_OR_DEPARTMENT_OTHER): Payer: PPO

## 2015-08-19 ENCOUNTER — Ambulatory Visit: Payer: PPO

## 2015-08-19 ENCOUNTER — Ambulatory Visit (HOSPITAL_BASED_OUTPATIENT_CLINIC_OR_DEPARTMENT_OTHER): Payer: PPO | Admitting: Nurse Practitioner

## 2015-08-19 ENCOUNTER — Ambulatory Visit (HOSPITAL_BASED_OUTPATIENT_CLINIC_OR_DEPARTMENT_OTHER): Payer: PPO

## 2015-08-19 VITALS — BP 137/55 | HR 70 | Temp 97.9°F | Resp 18 | Ht 62.0 in | Wt 166.4 lb

## 2015-08-19 DIAGNOSIS — Z5111 Encounter for antineoplastic chemotherapy: Secondary | ICD-10-CM

## 2015-08-19 DIAGNOSIS — C50919 Malignant neoplasm of unspecified site of unspecified female breast: Secondary | ICD-10-CM

## 2015-08-19 DIAGNOSIS — C482 Malignant neoplasm of peritoneum, unspecified: Secondary | ICD-10-CM

## 2015-08-19 DIAGNOSIS — C569 Malignant neoplasm of unspecified ovary: Secondary | ICD-10-CM

## 2015-08-19 DIAGNOSIS — R140 Abdominal distension (gaseous): Secondary | ICD-10-CM

## 2015-08-19 DIAGNOSIS — C801 Malignant (primary) neoplasm, unspecified: Secondary | ICD-10-CM

## 2015-08-19 DIAGNOSIS — C50912 Malignant neoplasm of unspecified site of left female breast: Secondary | ICD-10-CM

## 2015-08-19 DIAGNOSIS — C786 Secondary malignant neoplasm of retroperitoneum and peritoneum: Secondary | ICD-10-CM | POA: Diagnosis not present

## 2015-08-19 DIAGNOSIS — D701 Agranulocytosis secondary to cancer chemotherapy: Secondary | ICD-10-CM

## 2015-08-19 DIAGNOSIS — D696 Thrombocytopenia, unspecified: Secondary | ICD-10-CM

## 2015-08-19 DIAGNOSIS — Z95828 Presence of other vascular implants and grafts: Secondary | ICD-10-CM

## 2015-08-19 LAB — CBC WITH DIFFERENTIAL/PLATELET
BASO%: 0.4 % (ref 0.0–2.0)
BASOS ABS: 0 10*3/uL (ref 0.0–0.1)
EOS ABS: 0.1 10*3/uL (ref 0.0–0.5)
EOS%: 1.8 % (ref 0.0–7.0)
HCT: 31.4 % — ABNORMAL LOW (ref 34.8–46.6)
HEMOGLOBIN: 9.8 g/dL — AB (ref 11.6–15.9)
LYMPH%: 27.7 % (ref 14.0–49.7)
MCH: 28.9 pg (ref 25.1–34.0)
MCHC: 31.3 g/dL — AB (ref 31.5–36.0)
MCV: 92.3 fL (ref 79.5–101.0)
MONO#: 0.5 10*3/uL (ref 0.1–0.9)
MONO%: 16.8 % — AB (ref 0.0–14.0)
NEUT#: 1.7 10*3/uL (ref 1.5–6.5)
NEUT%: 53.3 % (ref 38.4–76.8)
Platelets: 95 10*3/uL — ABNORMAL LOW (ref 145–400)
RBC: 3.4 10*6/uL — ABNORMAL LOW (ref 3.70–5.45)
RDW: 19.2 % — AB (ref 11.2–14.5)
WBC: 3.2 10*3/uL — ABNORMAL LOW (ref 3.9–10.3)
lymph#: 0.9 10*3/uL (ref 0.9–3.3)

## 2015-08-19 LAB — COMPREHENSIVE METABOLIC PANEL
ALT: 11 U/L (ref 0–55)
ANION GAP: 9 meq/L (ref 3–11)
AST: 19 U/L (ref 5–34)
Albumin: 3.5 g/dL (ref 3.5–5.0)
Alkaline Phosphatase: 89 U/L (ref 40–150)
BUN: 16.5 mg/dL (ref 7.0–26.0)
CALCIUM: 9.3 mg/dL (ref 8.4–10.4)
CHLORIDE: 106 meq/L (ref 98–109)
CO2: 25 meq/L (ref 22–29)
CREATININE: 0.8 mg/dL (ref 0.6–1.1)
EGFR: 87 mL/min/{1.73_m2} — AB (ref >90)
Glucose: 101 mg/dl (ref 70–140)
POTASSIUM: 4.6 meq/L (ref 3.5–5.1)
Sodium: 140 mEq/L (ref 136–145)
Total Bilirubin: <0.3 mg/dL (ref 0.20–1.20)
Total Protein: 8.5 g/dL — ABNORMAL HIGH (ref 6.4–8.3)

## 2015-08-19 MED ORDER — SODIUM CHLORIDE 0.9 % IV SOLN
Freq: Once | INTRAVENOUS | Status: AC
  Administered 2015-08-19: 11:00:00 via INTRAVENOUS

## 2015-08-19 MED ORDER — SODIUM CHLORIDE 0.9% FLUSH
10.0000 mL | INTRAVENOUS | Status: DC | PRN
  Administered 2015-08-19: 10 mL
  Filled 2015-08-19: qty 10

## 2015-08-19 MED ORDER — PALONOSETRON HCL INJECTION 0.25 MG/5ML
0.2500 mg | Freq: Once | INTRAVENOUS | Status: AC
  Administered 2015-08-19: 0.25 mg via INTRAVENOUS

## 2015-08-19 MED ORDER — CARBOPLATIN CHEMO INTRADERMAL TEST DOSE 100MCG/0.02ML
100.0000 ug | Freq: Once | INTRADERMAL | Status: AC
  Administered 2015-08-19: 100 ug via INTRADERMAL
  Filled 2015-08-19: qty 0.01

## 2015-08-19 MED ORDER — SODIUM CHLORIDE 0.9 % IJ SOLN
10.0000 mL | INTRAMUSCULAR | Status: DC | PRN
  Administered 2015-08-19: 10 mL via INTRAVENOUS
  Filled 2015-08-19: qty 10

## 2015-08-19 MED ORDER — HEPARIN SOD (PORK) LOCK FLUSH 100 UNIT/ML IV SOLN
500.0000 [IU] | Freq: Once | INTRAVENOUS | Status: AC | PRN
  Administered 2015-08-19: 500 [IU]
  Filled 2015-08-19: qty 5

## 2015-08-19 MED ORDER — LIDOCAINE-PRILOCAINE 2.5-2.5 % EX CREA: TOPICAL_CREAM | CUTANEOUS | Status: DC | PRN

## 2015-08-19 MED ORDER — SODIUM CHLORIDE 0.9 % IV SOLN
10.0000 mg | Freq: Once | INTRAVENOUS | Status: AC
  Administered 2015-08-19: 10 mg via INTRAVENOUS
  Filled 2015-08-19: qty 1

## 2015-08-19 MED ORDER — PALONOSETRON HCL INJECTION 0.25 MG/5ML
INTRAVENOUS | Status: AC
  Filled 2015-08-19: qty 5

## 2015-08-19 MED ORDER — SODIUM CHLORIDE 0.9 % IV SOLN
570.0000 mg | Freq: Once | INTRAVENOUS | Status: AC
  Administered 2015-08-19: 570 mg via INTRAVENOUS
  Filled 2015-08-19: qty 57

## 2015-08-19 NOTE — Progress Notes (Signed)
Okay to treat with plt ct 95K per Ned Card, NP.

## 2015-08-19 NOTE — Patient Instructions (Signed)

## 2015-08-19 NOTE — Patient Instructions (Signed)
[facility] Discharge Instructions for Patients Receiving Chemotherapy  Today you received the following chemotherapy agents: Carboplatin   To help prevent nausea and vomiting after your treatment, we encourage you to take your nausea medication as directed.    If you develop nausea and vomiting that is not controlled by your nausea medication, call the clinic.   BELOW ARE SYMPTOMS THAT SHOULD BE REPORTED IMMEDIATELY:  *FEVER GREATER THAN 100.5 F  *CHILLS WITH OR WITHOUT FEVER  NAUSEA AND VOMITING THAT IS NOT CONTROLLED WITH YOUR NAUSEA MEDICATION  *UNUSUAL SHORTNESS OF BREATH  *UNUSUAL BRUISING OR BLEEDING  TENDERNESS IN MOUTH AND THROAT WITH OR WITHOUT PRESENCE OF ULCERS  *URINARY PROBLEMS  *BOWEL PROBLEMS  UNUSUAL RASH Items with * indicate a potential emergency and should be followed up as soon as possible.  Feel free to call the clinic you have any questions or concerns. The clinic phone number is [phone].  Please show the CHEMO ALERT CARD at check-in to the Emergency Department and triage nurse.   

## 2015-08-19 NOTE — Progress Notes (Signed)
[facility] OFFICE PROGRESS NOTE   Principle Diagnosis: 60 year old woman diagnosed with:  1. 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 This was diagnosed in July of 2014. 2. 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 The tumor is ER positive PR negative, HER-2 negative.   Prior Therapy:  She is status post paracentesis performed on 09/17/2012 with the cytology confirmed the presence of adenocarcinoma and immunohistochemical stains suggest GYN etiology. She is also status post lumpectomy for breast cancer diagnosed 25 years ago followed by radiation and chemotherapy under the care of Dr. Sonny Dandy. She did not receive any hormonal therapy. Chemotherapy utilizing carboplatin and Taxotere started on 10/01/2012. Avastin was added with cycle 4. This was discontinued in June of 2015. She is S/P Avastin maintenance only between June 2015 till March 2017. Therapy discontinued due to progression of disease. She is status post Doxil salvage chemotherapy therapy discontinued because of hypersensitivity reaction in March 2017.  Current therapy: Carboplatin AUC of 5 every 3 weeks cycle 1 started on 07/08/2015. Cycle 2 completed  07/29/2015.  INTERVAL HISTORY:   Ms. Megna returns as scheduled. She completed cycle 2 carboplatin 07/29/2015. She denies nausea/vomiting. No mouth sores. No diarrhea. No rash. No signs of allergic reaction. She noticed a "mole" at the left lower abdomen about a month ago. The "mole" subsequently "fell off". She wonders if she should see a dermatologist. She continues to have intermittent left chest/arm pain. The pain "comes and goes". Intermittently she notes associated numbness/tingling. She denies arm weakness. This has been occurring for greater than 2 months. She was seen by cardiology. Pain was not felt to be cardiac in origin;  possibly musculoskeletal, related to neck problems.  Objective:  Vital signs in last 24 hours:  Blood pressure 137/55, pulse 70, temperature 97.9 F (36.6 C), temperature source Oral, resp. rate 18, height _0  (1.575 m), weight 166 lb 6.4 oz (75.479 kg), SpO2 100 %.    HEENT: No thrush or ulcers. Resp: Lungs clear bilaterally. Cardio: Regular rate and rhythm. GI: Abdomen soft and nontender. No hepatomegaly. Vascular: No leg edema. Neuro: Arm strength intact.  Skin: Small cherry angiomas scattered over the abdominal wall.  Port-A-Cath without erythema.  Lab Results:  Lab Results  Component Value Date   WBC 3.2* 08/19/2015   HGB 9.8* 08/19/2015   HCT 31.4* 08/19/2015   MCV 92.3 08/19/2015   PLT 95* 08/19/2015   NEUTROABS 1.7 08/19/2015    Imaging:  No results found.  Medications: I have reviewed the patient's current medications.  Assessment/Plan: 1. Peritoneal carcinomatosis with omental involvement currently on active treatment with single agent carboplatin. She has completed 2 cycles. 2. IV access. She has a Port-A-Cath in place. 3. Thrombocytopenia. Stable. 4. Abdominal distention. No ascites noted on ultrasound. Unlikely related to peritoneal carcinomatosis. 5. Left breast mass status post biopsy with pathology indicating T2 N1 disease, ER positive, PR negative, HER-2 negative. Ki-67 50%. Case discussed in the breast cancer multidisciplinary conference with consensus to treat ovarian cancer relapse initially. 6. Neutropenia. She receives Neulasta. 7. Genetics. She has been seen by the genetics counselor with no genetic disorder identified.   Disposition: Ms. Tsukamoto appears stable. She has completed 2 cycles of single agent carboplatin. Plan to proceed with cycle 3 today as scheduled. She will return for a follow-up visit and cycle 4 in 3 weeks. The plan is for  restaging CT scans after completion of 4 cycles.  She will contact the office prior to her next visit  with any problems.    Ned Card ANP/GNP-BC   08/19/2015  10:35 AM

## 2015-08-20 LAB — CA 125: CANCER ANTIGEN (CA) 125: 177.7 U/mL — AB (ref 0.0–38.1)

## 2015-08-22 ENCOUNTER — Ambulatory Visit (HOSPITAL_BASED_OUTPATIENT_CLINIC_OR_DEPARTMENT_OTHER): Payer: PPO

## 2015-08-22 VITALS — BP 122/72 | HR 78 | Temp 98.2°F | Resp 16

## 2015-08-22 DIAGNOSIS — C482 Malignant neoplasm of peritoneum, unspecified: Secondary | ICD-10-CM | POA: Diagnosis not present

## 2015-08-22 DIAGNOSIS — Z5189 Encounter for other specified aftercare: Secondary | ICD-10-CM

## 2015-08-22 MED ORDER — PEGFILGRASTIM INJECTION 6 MG/0.6ML ~~LOC~~
6.0000 mg | PREFILLED_SYRINGE | Freq: Once | SUBCUTANEOUS | Status: AC
  Administered 2015-08-22: 6 mg via SUBCUTANEOUS
  Filled 2015-08-22: qty 0.6

## 2015-08-22 NOTE — Patient Instructions (Signed)
Pegfilgrastim injection What is this medicine? PEGFILGRASTIM (PEG fil gra stim) is a long-acting granulocyte colony-stimulating factor that stimulates the growth of neutrophils, a type of white blood cell important in the body's fight against infection. It is used to reduce the incidence of fever and infection in patients with certain types of cancer who are receiving chemotherapy that affects the bone marrow, and to increase survival after being exposed to high doses of radiation. This medicine may be used for other purposes; ask your health care provider or pharmacist if you have questions. What should I tell my health care provider before I take this medicine? They need to know if you have any of these conditions: -kidney disease -latex allergy -ongoing radiation therapy -sickle cell disease -skin reactions to acrylic adhesives (On-Body Injector only) -an unusual or allergic reaction to pegfilgrastim, filgrastim, other medicines, foods, dyes, or preservatives -pregnant or trying to get pregnant -breast-feeding How should I use this medicine? This medicine is for injection under the skin. If you get this medicine at home, you will be taught how to prepare and give the pre-filled syringe or how to use the On-body Injector. Refer to the patient Instructions for Use for detailed instructions. Use exactly as directed. Take your medicine at regular intervals. Do not take your medicine more often than directed. It is important that you put your used needles and syringes in a special sharps container. Do not put them in a trash can. If you do not have a sharps container, call your pharmacist or healthcare provider to get one. Talk to your pediatrician regarding the use of this medicine in children. While this drug may be prescribed for selected conditions, precautions do apply. Overdosage: If you think you have taken too much of this medicine contact a poison control center or emergency room at  once. NOTE: This medicine is only for you. Do not share this medicine with others. What if I miss a dose? It is important not to miss your dose. Call your doctor or health care professional if you miss your dose. If you miss a dose due to an On-body Injector failure or leakage, a new dose should be administered as soon as possible using a single prefilled syringe for manual use. What may interact with this medicine? Interactions have not been studied. Give your health care provider a list of all the medicines, herbs, non-prescription drugs, or dietary supplements you use. Also tell them if you smoke, drink alcohol, or use illegal drugs. Some items may interact with your medicine. This list may not describe all possible interactions. Give your health care provider a list of all the medicines, herbs, non-prescription drugs, or dietary supplements you use. Also tell them if you smoke, drink alcohol, or use illegal drugs. Some items may interact with your medicine. What should I watch for while using this medicine? You may need blood work done while you are taking this medicine. If you are going to need a MRI, CT scan, or other procedure, tell your doctor that you are using this medicine (On-Body Injector only). What side effects may I notice from receiving this medicine? Side effects that you should report to your doctor or health care professional as soon as possible: -allergic reactions like skin rash, itching or hives, swelling of the face, lips, or tongue -dizziness -fever -pain, redness, or irritation at site where injected -pinpoint red spots on the skin -red or dark-brown urine -shortness of breath or breathing problems -stomach or side pain, or pain   at the shoulder -swelling -tiredness -trouble passing urine or change in the amount of urine Side effects that usually do not require medical attention (report to your doctor or health care professional if they continue or are  bothersome): -bone pain -muscle pain This list may not describe all possible side effects. Call your doctor for medical advice about side effects. You may report side effects to FDA at 1-800-FDA-1088. Where should I keep my medicine? Keep out of the reach of children. Store pre-filled syringes in a refrigerator between 2 and 8 degrees C (36 and 46 degrees F). Do not freeze. Keep in carton to protect from light. Throw away this medicine if it is left out of the refrigerator for more than 48 hours. Throw away any unused medicine after the expiration date. NOTE: This sheet is a summary. It may not cover all possible information. If you have questions about this medicine, talk to your doctor, pharmacist, or health care provider.    2016, Elsevier/Gold Standard. (2014-02-25 14:30:14)  

## 2015-08-25 DIAGNOSIS — K219 Gastro-esophageal reflux disease without esophagitis: Secondary | ICD-10-CM | POA: Diagnosis not present

## 2015-08-25 DIAGNOSIS — R0981 Nasal congestion: Secondary | ICD-10-CM | POA: Diagnosis not present

## 2015-08-25 DIAGNOSIS — R490 Dysphonia: Secondary | ICD-10-CM | POA: Diagnosis not present

## 2015-08-29 ENCOUNTER — Ambulatory Visit: Payer: PPO | Admitting: Gynecology

## 2015-08-30 DIAGNOSIS — Z79899 Other long term (current) drug therapy: Secondary | ICD-10-CM | POA: Diagnosis not present

## 2015-08-31 ENCOUNTER — Ambulatory Visit: Payer: PPO | Admitting: Gynecology

## 2015-09-09 ENCOUNTER — Ambulatory Visit: Payer: PPO | Admitting: Nutrition

## 2015-09-09 ENCOUNTER — Ambulatory Visit (HOSPITAL_BASED_OUTPATIENT_CLINIC_OR_DEPARTMENT_OTHER): Payer: PPO

## 2015-09-09 ENCOUNTER — Ambulatory Visit (HOSPITAL_BASED_OUTPATIENT_CLINIC_OR_DEPARTMENT_OTHER): Payer: PPO | Admitting: Oncology

## 2015-09-09 ENCOUNTER — Other Ambulatory Visit (HOSPITAL_BASED_OUTPATIENT_CLINIC_OR_DEPARTMENT_OTHER): Payer: PPO

## 2015-09-09 ENCOUNTER — Ambulatory Visit: Payer: PPO

## 2015-09-09 ENCOUNTER — Telehealth: Payer: Self-pay | Admitting: Oncology

## 2015-09-09 VITALS — BP 132/62 | HR 70 | Temp 97.8°F | Resp 16 | Ht 62.0 in | Wt 167.3 lb

## 2015-09-09 DIAGNOSIS — C50912 Malignant neoplasm of unspecified site of left female breast: Secondary | ICD-10-CM

## 2015-09-09 DIAGNOSIS — C801 Malignant (primary) neoplasm, unspecified: Secondary | ICD-10-CM

## 2015-09-09 DIAGNOSIS — Z95828 Presence of other vascular implants and grafts: Secondary | ICD-10-CM

## 2015-09-09 DIAGNOSIS — C786 Secondary malignant neoplasm of retroperitoneum and peritoneum: Secondary | ICD-10-CM

## 2015-09-09 DIAGNOSIS — C482 Malignant neoplasm of peritoneum, unspecified: Secondary | ICD-10-CM

## 2015-09-09 DIAGNOSIS — D696 Thrombocytopenia, unspecified: Secondary | ICD-10-CM

## 2015-09-09 DIAGNOSIS — Z5111 Encounter for antineoplastic chemotherapy: Secondary | ICD-10-CM

## 2015-09-09 DIAGNOSIS — D701 Agranulocytosis secondary to cancer chemotherapy: Secondary | ICD-10-CM

## 2015-09-09 DIAGNOSIS — C50919 Malignant neoplasm of unspecified site of unspecified female breast: Secondary | ICD-10-CM

## 2015-09-09 DIAGNOSIS — R140 Abdominal distension (gaseous): Secondary | ICD-10-CM

## 2015-09-09 LAB — COMPREHENSIVE METABOLIC PANEL
ALBUMIN: 3.4 g/dL — AB (ref 3.5–5.0)
ALK PHOS: 92 U/L (ref 40–150)
AST: 15 U/L (ref 5–34)
Anion Gap: 7 mEq/L (ref 3–11)
BUN: 13.7 mg/dL (ref 7.0–26.0)
CO2: 27 meq/L (ref 22–29)
CREATININE: 0.7 mg/dL (ref 0.6–1.1)
Calcium: 9.4 mg/dL (ref 8.4–10.4)
Chloride: 105 mEq/L (ref 98–109)
EGFR: >90 mL/min/{1.73_m2} (ref >90)
GLUCOSE: 112 mg/dL (ref 70–140)
Potassium: 4.5 mEq/L (ref 3.5–5.1)
SODIUM: 140 meq/L (ref 136–145)
TOTAL PROTEIN: 8.3 g/dL (ref 6.4–8.3)

## 2015-09-09 LAB — CBC WITH DIFFERENTIAL/PLATELET
BASO%: 0.5 % (ref 0.0–2.0)
Basophils Absolute: 0 10*3/uL (ref 0.0–0.1)
EOS ABS: 0 10*3/uL (ref 0.0–0.5)
EOS%: 1.3 % (ref 0.0–7.0)
HCT: 30.1 % — ABNORMAL LOW (ref 34.8–46.6)
HEMOGLOBIN: 9.5 g/dL — AB (ref 11.6–15.9)
LYMPH%: 31.4 % (ref 14.0–49.7)
MCH: 29.9 pg (ref 25.1–34.0)
MCHC: 31.4 g/dL — ABNORMAL LOW (ref 31.5–36.0)
MCV: 95 fL (ref 79.5–101.0)
MONO#: 0.3 10*3/uL (ref 0.1–0.9)
MONO%: 9.6 % (ref 0.0–14.0)
NEUT%: 57.2 % (ref 38.4–76.8)
NEUTROS ABS: 1.9 10*3/uL (ref 1.5–6.5)
PLATELETS: 91 10*3/uL — AB (ref 145–400)
RBC: 3.16 10*6/uL — AB (ref 3.70–5.45)
RDW: 22.6 % — AB (ref 11.2–14.5)
WBC: 3.4 10*3/uL — AB (ref 3.9–10.3)
lymph#: 1.1 10*3/uL (ref 0.9–3.3)

## 2015-09-09 MED ORDER — SODIUM CHLORIDE 0.9% FLUSH
10.0000 mL | INTRAVENOUS | Status: DC | PRN
  Administered 2015-09-09: 10 mL
  Filled 2015-09-09: qty 10

## 2015-09-09 MED ORDER — SODIUM CHLORIDE 0.9 % IV SOLN
10.0000 mg | Freq: Once | INTRAVENOUS | Status: AC
  Administered 2015-09-09: 10 mg via INTRAVENOUS
  Filled 2015-09-09: qty 1

## 2015-09-09 MED ORDER — SODIUM CHLORIDE 0.9 % IJ SOLN
10.0000 mL | INTRAMUSCULAR | Status: DC | PRN
Start: 2015-09-09 — End: 2015-09-09
  Administered 2015-09-09: 10 mL via INTRAVENOUS
  Filled 2015-09-09: qty 10

## 2015-09-09 MED ORDER — CARBOPLATIN CHEMO INTRADERMAL TEST DOSE 100MCG/0.02ML
100.0000 ug | Freq: Once | INTRADERMAL | Status: AC
  Administered 2015-09-09: 100 ug via INTRADERMAL
  Filled 2015-09-09: qty 0.01

## 2015-09-09 MED ORDER — PALONOSETRON HCL INJECTION 0.25 MG/5ML
0.2500 mg | Freq: Once | INTRAVENOUS | Status: AC
  Administered 2015-09-09: 0.25 mg via INTRAVENOUS

## 2015-09-09 MED ORDER — HEPARIN SOD (PORK) LOCK FLUSH 100 UNIT/ML IV SOLN
500.0000 [IU] | Freq: Once | INTRAVENOUS | Status: AC | PRN
  Administered 2015-09-09: 500 [IU]
  Filled 2015-09-09: qty 5

## 2015-09-09 MED ORDER — SODIUM CHLORIDE 0.9 % IV SOLN
570.0000 mg | Freq: Once | INTRAVENOUS | Status: AC
  Administered 2015-09-09: 570 mg via INTRAVENOUS
  Filled 2015-09-09: qty 57

## 2015-09-09 MED ORDER — PALONOSETRON HCL INJECTION 0.25 MG/5ML
INTRAVENOUS | Status: AC
  Filled 2015-09-09: qty 5

## 2015-09-09 MED ORDER — SODIUM CHLORIDE 0.9 % IV SOLN
Freq: Once | INTRAVENOUS | Status: AC
  Administered 2015-09-09: 12:00:00 via INTRAVENOUS

## 2015-09-09 NOTE — Progress Notes (Signed)
60 year old female diagnosed with peritoneal cancer July 2014 and breast cancer May 2017.  She is a patient of Dr. Alen Blew.  Past medical history includes osteopenia, hypothyroidism, depression, anxiety, hyperlipidemia, bipolar disease, ADD and diabetes.  Medications include Xanax, Synthroid, Imodium, Prilosec, Zofran, and Compazine.  Labs include albumin 3.5 on June 30.  Height: 62 inches. Weight: 167.3 pounds July 21. Usual body weight: 160 pounds. BMI: 30.59  Patient reports she is craving increased sweets and would like assistance in minimizing further weight gain.  Nutrition diagnosis:  Food and nutrition related knowledge deficit related to cancer diagnoses and associated treatments as evidenced by no prior need for nutrition related information.  Intervention: Patient was educated on healthy plant-based diet with increased complex carbohydrates, reduced concentrated sweets, increased vegetables and adequate protein. Encouraged patient to consume a balance of macronutrients at each meal. Questions were answered.  Teach back method used. Nutrition diagnosis resolved.  No follow-up necessary.  Patient has my contact information if she develops questions.  **Disclaimer: This note was dictated with voice recognition software. Similar sounding words can inadvertently be transcribed and this note may contain transcription errors which may not have been corrected upon publication of note.**

## 2015-09-09 NOTE — Progress Notes (Signed)
Platelets 91, Dr. Alen Blew aware, per [facility] RN per Dr. Alen Blew okay to treat.

## 2015-09-09 NOTE — Patient Instructions (Signed)

## 2015-09-09 NOTE — Progress Notes (Signed)
Hematology and Oncology Follow Up Visit  [name] [id number] [birth year] 60 y.o. 09/09/2015 10:35 AM    Principle Diagnosis: 60 year old woman diagnosed with:  1. 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 This was diagnosed in July of 2014. 2. 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 The tumor is ER positive PR negative, HER-2 negative.   Prior Therapy:  She is status post paracentesis performed on 09/17/2012 with the cytology confirmed the presence of adenocarcinoma and immunohistochemical stains suggest GYN etiology. She is also status post lumpectomy for breast cancer diagnosed 25 years ago followed by radiation and chemotherapy under the care of Dr. Sonny Dandy. She did not receive any hormonal therapy. Chemotherapy utilizing carboplatin and Taxotere started on 10/01/2012. Avastin was added with cycle 4. This was discontinued in June of 2015. She is S/P Avastin maintenance only between June 2015 till March 2017. Therapy discontinued due to progression of disease. She is status post Doxil salvage chemotherapy therapy discontinued because of hypersensitivity reaction in March 2017.  Current therapy:  Carboplatin and AUC of 5 every 3 weeks cycle 1 started on 07/08/2015. She is here for cycle 4 of therapy.  Interim History: [name] presents today for a followup visit with her husband. Since her last visit, she reports no major changes in her health. She continues to have reasonable quality of life and performance status. She tolerated chemotherapy well without any major complications. She denied any side effects such as nausea, vomiting or GI toxicities. She does report some occasional nausea and Compazine will be available to her moving forward. She does report some occasional abdominal distention and on satiety. She denied any bleeding complications such  as epistaxis or hematochezia.   She does not report any headaches, blurry vision, syncope or seizures. She denied any fever chills. She does report some chest discomfort but no dyspnea on exertion. Has not reported any shortness of breath or cough. She denied any pain with swallowing. She does not report any vomiting, constipation does report some occasional diarrhea. She denied any GYN bleeding. She reports no anxiety or depression. Remainder of the review of systems is unremarkable.  Medications: I have reviewed the patient's current medications.  Current Outpatient Prescriptions  Medication Sig Dispense Refill  . Acetylcarnitine HCl (ACETYL L-CARNITINE) 500 MG CAPS Take 1 capsule by mouth at bedtime.    . ALPRAZolam (XANAX) 1 MG tablet Take 1 mg by mouth at bedtime as needed for anxiety.    . divalproex (DEPAKOTE) 250 MG DR tablet Take 250 mg by mouth 2 (two) times daily. '500mg'$  in the morning and 1500 mg in the evening    . fluticasone (FLONASE) 50 MCG/ACT nasal spray INHALE 2 SPRAYS INTO EACH NOSTRIL EVERY NIGHT  11  . gabapentin (NEURONTIN) 100 MG capsule TAKE ONE CAPSULE BY MOUTH THREE TIMES A DAY 90 capsule 1  . lamoTRIgine (LAMICTAL) 150 MG tablet Take 150 mg by mouth at bedtime.  1  . levothyroxine (SYNTHROID, LEVOTHROID) 100 MCG tablet Take 100 mcg by mouth daily before breakfast.     . lidocaine-prilocaine (EMLA) cream Apply topically as needed. Apply to port with every chemotherapy. 30 g 0  . loperamide (IMODIUM A-D) 2 MG tablet Take 2 mg by mouth as needed for diarrhea or loose stools.    Marland Kitchen omeprazole (PRILOSEC) 40 MG capsule Take 40 mg by mouth.    Marland Kitchen  ondansetron (ZOFRAN ODT) 8 MG disintegrating tablet Take 1 tablet (8 mg total) by mouth every 8 (eight) hours as needed for nausea or vomiting. 20 tablet 0  . ondansetron (ZOFRAN) 8 MG tablet TAKE 1 TABLET BY MOUTH EVERY 8 HOURS AS NEEDED FOR NAUSEA AND VOMITING 20 tablet 1  . prochlorperazine (COMPAZINE) 10 MG tablet Take 1 tablet (10  mg total) by mouth every 6 (six) hours as needed for nausea or vomiting. 30 tablet 1  . rOPINIRole (REQUIP) 1 MG tablet Take 1 mg by mouth 3 (three) times daily. Patient takes 1.5 mg at bedtime     No current facility-administered medications for this visit.   Facility-Administered Medications Ordered in Other Visits  Medication Dose Route Frequency Provider Last Rate Last Dose  . heparin lock flush 100 unit/mL  500 Units Intravenous Once Wyatt Portela, MD      . sodium chloride flush (NS) 0.9 % injection 10 mL  10 mL Intravenous PRN Wyatt Portela, MD         Allergies:  Allergies  Allergen Reactions  . Doxil [Doxorubicin Hcl Liposomal] Anaphylaxis    1st Doxil.   . Pollen Extract Other (See Comments)    Pollen and grass causes a lot sneezing    Past Medical History, Surgical history, Social history, and Family History were reviewed and updated.   Physical Exam: Blood pressure 132/62, pulse 70, temperature 97.8 F (36.6 C), temperature source Oral, resp. rate 16, height '5\' 2"'$  (1.575 m), weight 167 lb 4.8 oz (75.887 kg), SpO2 100 %.  ECOG: 1 General appearance: Alert, awake woman without distress. Head: Normocephalic, without obvious abnormality. No oral thrush noted. Neck: no adenopathy Lymph nodes: Cervical, supraclavicular, and axillary nodes normal. Heart:regular rate and rhythm, S1, S2 normal, no murmur, click, rub or gallop Lung:chest clear, no wheezing, rales, normal symmetric air entry Abdomin: Soft, nontender with good bowel sounds. Slightly distended without shifting dullness or ascites. EXT:no erythema, induration, or nodules. No edema. Skin: No rashes or lesions.  Lab Results: Lab Results  Component Value Date   WBC 3.4* 09/09/2015   HGB 9.5* 09/09/2015   HCT 30.1* 09/09/2015   MCV 95.0 09/09/2015   PLT 91* 09/09/2015     Chemistry      Component Value Date/Time   NA 140 08/19/2015 0903   NA 144 05/25/2015 0931   NA 143 02/11/2015 1005   K 4.6  08/19/2015 0903   K 4.4 05/25/2015 0931   CL 102 05/25/2015 0931   CO2 25 08/19/2015 0903   CO2 26 05/25/2015 0931   BUN 16.5 08/19/2015 0903   BUN 19 05/25/2015 0931   BUN 16 02/11/2015 1005   CREATININE 0.8 08/19/2015 0903   CREATININE 0.82 05/25/2015 0931      Component Value Date/Time   CALCIUM 9.3 08/19/2015 0903   CALCIUM 8.7 05/25/2015 0931   ALKPHOS 89 08/19/2015 0903   ALKPHOS 58 05/25/2015 0931   AST 19 08/19/2015 0903   AST 23 05/25/2015 0931   ALT 11 08/19/2015 0903   ALT 11 05/25/2015 0931   BILITOT <0.30 08/19/2015 0903   BILITOT 0.2 05/25/2015 0931   BILITOT 0.5 02/11/2015 1005     Results for [name], [name] (MRN [id number]) as of 09/09/2015 10:07  Ref. Range 07/29/2015 11:16 08/19/2015 09:03  Cancer Antigen (CA) 125 Latest Ref Range: 0.0-38.1 U/mL 189.8 (H) 177.7 (H)      Impression and Plan:  60 year old woman with the following issues:  1. Peritoneal  carcinomatosis with omental involvement. The pathology confirmed the presence of adenocarcinoma of likely GYN etiology. Her CA 125 was elevated at 333.5 on 09/19/2012. She is status post systemic chemotherapy with an excellent response utilizing carboplatin and Taxotere and a Avastin . This therapy was held in June of 2015 and currently on Avastin maintenance.   CT scan obtained on 05/05/2015 as well as her tumor markers showed clear progression. Her CA-125 is up to 125 and her CT scan showed increase in her peritoneal disease.   She is currently receiving salvage therapy with carboplatin single agent at AUC of 5 and here for cycle 4 of therapy. The plan is to proceed with the same dose and schedule and repeat imaging studies were cycle 5.    2. IV access: Port-A-Cath is utilized for chemotherapy without complications. No issues were flushes or access.  3. Thrombocytopenia: Her platelets count is stable without any bleeding.  4. Abdominal distention: No ascites noted on ultrasound unlikely related to  peritoneal carcinomatosis. No changes since the last visit.  5. Left breast mass: She status post biopsy with the pathology indicating T2 N1 disease with the tumor have ER positive, PR negative HER-2 negative. Her Ki-67 is 50%.  The plan is to treat with systemic chemotherapy and consider salvage mastectomy if she has a good response to her ovarian cancer.  8. Neutropenia: Neulasta will be added to each cycle of therapy. Complications associated with this therapy was discussed including arthralgias, myalgias and instructions to use Claritin was given to the patient. No complications noted.  9. Genetic counseling: She has been evaluated for possible genetic disorder but none has been identified.  10. The followup: She will in 3 weeks for the next cycle of therapy.    [facility], MD 7/21/201710:35 AM

## 2015-09-09 NOTE — Telephone Encounter (Signed)
Pt will get sched in tx room °

## 2015-09-09 NOTE — Patient Instructions (Signed)
[facility] Discharge Instructions for Patients Receiving Chemotherapy  Today you received the following chemotherapy agents carboplatin To help prevent nausea and vomiting after your treatment, we encourage you to take your nausea medication as directed If you develop nausea and vomiting that is not controlled by your nausea medication, call the clinic.   BELOW ARE SYMPTOMS THAT SHOULD BE REPORTED IMMEDIATELY:  *FEVER GREATER THAN 100.5 F  *CHILLS WITH OR WITHOUT FEVER  NAUSEA AND VOMITING THAT IS NOT CONTROLLED WITH YOUR NAUSEA MEDICATION  *UNUSUAL SHORTNESS OF BREATH  *UNUSUAL BRUISING OR BLEEDING  TENDERNESS IN MOUTH AND THROAT WITH OR WITHOUT PRESENCE OF ULCERS  *URINARY PROBLEMS  *BOWEL PROBLEMS  UNUSUAL RASH Items with * indicate a potential emergency and should be followed up as soon as possible.  Feel free to call the clinic you have any questions or concerns. The clinic phone number is [phone].  Please show the CHEMO ALERT CARD at check-in to the Emergency Department and triage nurse.   

## 2015-09-09 NOTE — Progress Notes (Signed)
Per dr Alen Blew, Spoke with barb neff dietitian, she will try to see patient today in the infusion room if time permits, if not she will make an appt for a later date

## 2015-09-11 LAB — CA 125: CANCER ANTIGEN (CA) 125: 120.7 U/mL — AB (ref 0.0–38.1)

## 2015-09-12 ENCOUNTER — Ambulatory Visit (HOSPITAL_BASED_OUTPATIENT_CLINIC_OR_DEPARTMENT_OTHER): Payer: PPO

## 2015-09-12 VITALS — BP 123/73 | HR 70 | Temp 98.5°F | Resp 18

## 2015-09-12 DIAGNOSIS — C801 Malignant (primary) neoplasm, unspecified: Secondary | ICD-10-CM | POA: Diagnosis not present

## 2015-09-12 DIAGNOSIS — C482 Malignant neoplasm of peritoneum, unspecified: Secondary | ICD-10-CM

## 2015-09-12 MED ORDER — PEGFILGRASTIM INJECTION 6 MG/0.6ML ~~LOC~~
6.0000 mg | PREFILLED_SYRINGE | Freq: Once | SUBCUTANEOUS | Status: DC
  Filled 2015-09-12: qty 0.6

## 2015-09-15 ENCOUNTER — Telehealth: Payer: Self-pay | Admitting: Oncology

## 2015-09-15 DIAGNOSIS — F3175 Bipolar disorder, in partial remission, most recent episode depressed: Secondary | ICD-10-CM | POA: Diagnosis not present

## 2015-09-15 DIAGNOSIS — E7220 Disorder of urea cycle metabolism, unspecified: Secondary | ICD-10-CM | POA: Diagnosis not present

## 2015-09-15 NOTE — Telephone Encounter (Signed)
[name] cld and left vm of r/s appt

## 2015-09-15 NOTE — Telephone Encounter (Signed)
pt cld to req appt on 8/14 be moved to after 12-moved for [name]-[name] to call

## 2015-09-19 ENCOUNTER — Other Ambulatory Visit: Payer: Self-pay | Admitting: Medical Oncology

## 2015-09-19 NOTE — Progress Notes (Signed)
09/12/15-Neulasta 6 mg given left arm .

## 2015-09-28 DIAGNOSIS — R790 Abnormal level of blood mineral: Secondary | ICD-10-CM | POA: Diagnosis not present

## 2015-09-29 ENCOUNTER — Other Ambulatory Visit (HOSPITAL_BASED_OUTPATIENT_CLINIC_OR_DEPARTMENT_OTHER): Payer: PPO

## 2015-09-29 ENCOUNTER — Ambulatory Visit (HOSPITAL_COMMUNITY)
Admission: RE | Admit: 2015-09-29 | Discharge: 2015-09-29 | Disposition: A | Discharge status: Home / Self Care | Payer: PPO | Source: Ambulatory Visit | Attending: Oncology | Admitting: Oncology

## 2015-09-29 ENCOUNTER — Ambulatory Visit (HOSPITAL_BASED_OUTPATIENT_CLINIC_OR_DEPARTMENT_OTHER): Payer: PPO

## 2015-09-29 ENCOUNTER — Encounter (HOSPITAL_COMMUNITY): Payer: Self-pay

## 2015-09-29 DIAGNOSIS — C482 Malignant neoplasm of peritoneum, unspecified: Secondary | ICD-10-CM | POA: Insufficient documentation

## 2015-09-29 DIAGNOSIS — I700 Atherosclerosis of aorta: Secondary | ICD-10-CM | POA: Insufficient documentation

## 2015-09-29 DIAGNOSIS — C50919 Malignant neoplasm of unspecified site of unspecified female breast: Secondary | ICD-10-CM | POA: Diagnosis not present

## 2015-09-29 DIAGNOSIS — R935 Abnormal findings on diagnostic imaging of other abdominal regions, including retroperitoneum: Secondary | ICD-10-CM | POA: Insufficient documentation

## 2015-09-29 DIAGNOSIS — Z95828 Presence of other vascular implants and grafts: Secondary | ICD-10-CM

## 2015-09-29 DIAGNOSIS — R161 Splenomegaly, not elsewhere classified: Secondary | ICD-10-CM | POA: Diagnosis not present

## 2015-09-29 DIAGNOSIS — C786 Secondary malignant neoplasm of retroperitoneum and peritoneum: Secondary | ICD-10-CM | POA: Insufficient documentation

## 2015-09-29 DIAGNOSIS — C7989 Secondary malignant neoplasm of other specified sites: Secondary | ICD-10-CM | POA: Diagnosis not present

## 2015-09-29 LAB — CBC WITH DIFFERENTIAL/PLATELET
BASO%: 0.3 % (ref 0.0–2.0)
BASOS ABS: 0 10*3/uL (ref 0.0–0.1)
EOS ABS: 0.1 10*3/uL (ref 0.0–0.5)
EOS%: 3.6 % (ref 0.0–7.0)
HEMATOCRIT: 31.4 % — AB (ref 34.8–46.6)
HEMOGLOBIN: 9.6 g/dL — AB (ref 11.6–15.9)
LYMPH#: 1.1 10*3/uL (ref 0.9–3.3)
LYMPH%: 31.2 % (ref 14.0–49.7)
MCH: 30.7 pg (ref 25.1–34.0)
MCHC: 30.6 g/dL — ABNORMAL LOW (ref 31.5–36.0)
MCV: 100.3 fL (ref 79.5–101.0)
MONO#: 0.4 10*3/uL (ref 0.1–0.9)
MONO%: 12 % (ref 0.0–14.0)
NEUT%: 52.9 % (ref 38.4–76.8)
NEUTROS ABS: 1.9 10*3/uL (ref 1.5–6.5)
Platelets: 81 10*3/uL — ABNORMAL LOW (ref 145–400)
RBC: 3.13 10*6/uL — ABNORMAL LOW (ref 3.70–5.45)
RDW: 19.6 % — ABNORMAL HIGH (ref 11.2–14.5)
WBC: 3.6 10*3/uL — ABNORMAL LOW (ref 3.9–10.3)

## 2015-09-29 LAB — COMPREHENSIVE METABOLIC PANEL
ALBUMIN: 3.3 g/dL — AB (ref 3.5–5.0)
ALK PHOS: 80 U/L (ref 40–150)
ALT: 10 U/L (ref 0–55)
AST: 19 U/L (ref 5–34)
Anion Gap: 7 mEq/L (ref 3–11)
BILIRUBIN TOTAL: 0.3 mg/dL (ref 0.20–1.20)
BUN: 10.2 mg/dL (ref 7.0–26.0)
CALCIUM: 9.8 mg/dL (ref 8.4–10.4)
CO2: 28 mEq/L (ref 22–29)
CREATININE: 0.7 mg/dL (ref 0.6–1.1)
Chloride: 104 mEq/L (ref 98–109)
EGFR: >90 mL/min/{1.73_m2} (ref >90)
Glucose: 88 mg/dl (ref 70–140)
POTASSIUM: 4.8 meq/L (ref 3.5–5.1)
Sodium: 139 mEq/L (ref 136–145)
TOTAL PROTEIN: 8.2 g/dL (ref 6.4–8.3)

## 2015-09-29 MED ORDER — SODIUM CHLORIDE 0.9 % IJ SOLN
10.0000 mL | INTRAMUSCULAR | Status: DC | PRN
  Administered 2015-09-29: 10 mL via INTRAVENOUS
  Filled 2015-09-29: qty 10

## 2015-09-29 MED ORDER — IOPAMIDOL (ISOVUE-300) INJECTION 61%
100.0000 mL | Freq: Once | INTRAVENOUS | Status: AC | PRN
  Administered 2015-09-29: 100 mL via INTRAVENOUS

## 2015-09-29 NOTE — Patient Instructions (Signed)

## 2015-09-30 ENCOUNTER — Ambulatory Visit (HOSPITAL_BASED_OUTPATIENT_CLINIC_OR_DEPARTMENT_OTHER): Payer: PPO | Admitting: Oncology

## 2015-09-30 ENCOUNTER — Telehealth: Payer: Self-pay | Admitting: Oncology

## 2015-09-30 ENCOUNTER — Ambulatory Visit (HOSPITAL_BASED_OUTPATIENT_CLINIC_OR_DEPARTMENT_OTHER): Payer: PPO

## 2015-09-30 VITALS — BP 135/79 | HR 68 | Temp 97.9°F | Resp 18 | Ht 62.0 in | Wt 165.4 lb

## 2015-09-30 DIAGNOSIS — C569 Malignant neoplasm of unspecified ovary: Secondary | ICD-10-CM

## 2015-09-30 DIAGNOSIS — Z853 Personal history of malignant neoplasm of breast: Secondary | ICD-10-CM | POA: Diagnosis not present

## 2015-09-30 DIAGNOSIS — Z111 Encounter for screening for respiratory tuberculosis: Secondary | ICD-10-CM | POA: Diagnosis not present

## 2015-09-30 DIAGNOSIS — C786 Secondary malignant neoplasm of retroperitoneum and peritoneum: Secondary | ICD-10-CM | POA: Diagnosis not present

## 2015-09-30 DIAGNOSIS — C482 Malignant neoplasm of peritoneum, unspecified: Secondary | ICD-10-CM

## 2015-09-30 DIAGNOSIS — D696 Thrombocytopenia, unspecified: Secondary | ICD-10-CM

## 2015-09-30 DIAGNOSIS — Z5111 Encounter for antineoplastic chemotherapy: Secondary | ICD-10-CM

## 2015-09-30 DIAGNOSIS — C801 Malignant (primary) neoplasm, unspecified: Secondary | ICD-10-CM | POA: Diagnosis not present

## 2015-09-30 LAB — CA 125: Cancer Antigen (CA) 125: 82.3 U/mL — ABNORMAL HIGH (ref 0.0–38.1)

## 2015-09-30 MED ORDER — HEPARIN SOD (PORK) LOCK FLUSH 100 UNIT/ML IV SOLN
500.0000 [IU] | Freq: Once | INTRAVENOUS | Status: AC | PRN
  Administered 2015-09-30: 500 [IU]
  Filled 2015-09-30: qty 5

## 2015-09-30 MED ORDER — PALONOSETRON HCL INJECTION 0.25 MG/5ML
0.2500 mg | Freq: Once | INTRAVENOUS | Status: AC
  Administered 2015-09-30: 0.25 mg via INTRAVENOUS

## 2015-09-30 MED ORDER — CARBOPLATIN CHEMO INTRADERMAL TEST DOSE 100MCG/0.02ML
100.0000 ug | Freq: Once | INTRADERMAL | Status: AC
  Administered 2015-09-30: 100 ug via INTRADERMAL
  Filled 2015-09-30: qty 0.01

## 2015-09-30 MED ORDER — SODIUM CHLORIDE 0.9 % IV SOLN
10.0000 mg | Freq: Once | INTRAVENOUS | Status: AC
  Administered 2015-09-30: 10 mg via INTRAVENOUS
  Filled 2015-09-30: qty 1

## 2015-09-30 MED ORDER — SODIUM CHLORIDE 0.9 % IV SOLN
Freq: Once | INTRAVENOUS | Status: AC
  Administered 2015-09-30: 11:00:00 via INTRAVENOUS

## 2015-09-30 MED ORDER — SODIUM CHLORIDE 0.9% FLUSH
10.0000 mL | INTRAVENOUS | Status: DC | PRN
  Administered 2015-09-30: 10 mL
  Filled 2015-09-30: qty 10

## 2015-09-30 MED ORDER — PALONOSETRON HCL INJECTION 0.25 MG/5ML
INTRAVENOUS | Status: AC
  Filled 2015-09-30: qty 5

## 2015-09-30 MED ORDER — SODIUM CHLORIDE 0.9 % IV SOLN
570.0000 mg | Freq: Once | INTRAVENOUS | Status: AC
  Administered 2015-09-30: 570 mg via INTRAVENOUS
  Filled 2015-09-30: qty 57

## 2015-09-30 NOTE — Progress Notes (Signed)
Hematology and Oncology Follow Up Visit  [name] [id number] 03-29-1955 60 y.o. 09/30/2015 9:59 AM    Principle Diagnosis: 60 year old woman diagnosed with:  1. 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 This was diagnosed in July of 2014. 2. 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 The tumor is ER positive PR negative, HER-2 negative.   Prior Therapy:  She is status post paracentesis performed on 09/17/2012 with the cytology confirmed the presence of adenocarcinoma and immunohistochemical stains suggest GYN etiology. She is also status post lumpectomy for breast cancer diagnosed 25 years ago followed by radiation and chemotherapy under the care of Dr. Sonny Dandy. She did not receive any hormonal therapy. Chemotherapy utilizing carboplatin and Taxotere started on 10/01/2012. Avastin was added with cycle 4. This was discontinued in June of 2015. She is S/P Avastin maintenance only between June 2015 till March 2017. Therapy discontinued due to progression of disease. She is status post Doxil salvage chemotherapy therapy discontinued because of hypersensitivity reaction in March 2017.  Current therapy:  Carboplatin and AUC of 5 every 3 weeks cycle 1 started on 07/08/2015. She is here for cycle 5 of therapy.  Interim History: [name] presents today for a followup visit with her husband. Since her last visit, she continues to do well without any major complications. She tolerated carboplatin last cycle with no issues. She denied any nausea, vomiting or peripheral neuropathy. She denied any bleeding complications. She denied any hematochezia or melena.  She continues to have reasonable quality of life and performance status. She does report some occasional abdominal distention and on satiety. Her weight is improving as well as her voice have also improved. She  remains active including many recreational classes. Her quality of life maintained on this chemotherapy.   She does not report any headaches, blurry vision, syncope or seizures. She denied any fever chills. She does report some chest discomfort but no dyspnea on exertion. Has not reported any shortness of breath or cough. She denied any pain with swallowing. She does not report any vomiting, constipation does report some occasional diarrhea. She denied any GYN bleeding. She reports no anxiety or depression. Remainder of the review of systems is unremarkable.  Medications: I have reviewed the patient's current medications.  Current Outpatient Prescriptions  Medication Sig Dispense Refill  . Acetylcarnitine HCl (ACETYL L-CARNITINE) 500 MG CAPS Take 1 capsule by mouth at bedtime.    . ALPRAZolam (XANAX) 1 MG tablet Take 1 mg by mouth at bedtime as needed for anxiety.    . divalproex (DEPAKOTE) 250 MG DR tablet Take 250 mg by mouth 2 (two) times daily. 561m in the morning and 1500 mg in the evening    . fluticasone (FLONASE) 50 MCG/ACT nasal spray INHALE 2 SPRAYS INTO EACH NOSTRIL EVERY NIGHT  11  . gabapentin (NEURONTIN) 100 MG capsule TAKE ONE CAPSULE BY MOUTH THREE TIMES A DAY 90 capsule 1  . lamoTRIgine (LAMICTAL) 150 MG tablet Take 150 mg by mouth at bedtime.  1  . levothyroxine (SYNTHROID, LEVOTHROID) 100 MCG tablet Take 100 mcg by mouth daily before breakfast.     . lidocaine-prilocaine (EMLA) cream Apply topically as needed. Apply to port with every chemotherapy. 30 g 0  . loperamide (IMODIUM A-D) 2 MG tablet Take 2 mg by mouth as needed for diarrhea or loose stools.    .Marland Kitchenomeprazole (PRILOSEC) 40  MG capsule Take 40 mg by mouth.    . ondansetron (ZOFRAN ODT) 8 MG disintegrating tablet Take 1 tablet (8 mg total) by mouth every 8 (eight) hours as needed for nausea or vomiting. 20 tablet 0  . ondansetron (ZOFRAN) 8 MG tablet TAKE 1 TABLET BY MOUTH EVERY 8 HOURS AS NEEDED FOR NAUSEA AND VOMITING  20 tablet 1  . prochlorperazine (COMPAZINE) 10 MG tablet Take 1 tablet (10 mg total) by mouth every 6 (six) hours as needed for nausea or vomiting. 30 tablet 1  . rOPINIRole (REQUIP) 1 MG tablet Take 1 mg by mouth 3 (three) times daily. Patient takes 1.5 mg at bedtime     No current facility-administered medications for this visit.    Facility-Administered Medications Ordered in Other Visits  Medication Dose Route Frequency Provider Last Rate Last Dose  . heparin lock flush 100 unit/mL  500 Units Intravenous Once Wyatt Portela, MD      . sodium chloride flush (NS) 0.9 % injection 10 mL  10 mL Intravenous PRN Wyatt Portela, MD         Allergies:  Allergies  Allergen Reactions  . Doxil [Doxorubicin Hcl Liposomal] Anaphylaxis    1st Doxil.   . Pollen Extract Other (See Comments)    Pollen and grass causes a lot sneezing    Past Medical History, Surgical history, Social history, and Family History were reviewed and updated.   Physical Exam: Blood pressure 135/79, pulse 68, temperature 97.9 F (36.6 C), temperature source Oral, resp. rate 18, height '5\' 2"'$  (1.575 m), weight 165 lb 6.4 oz (75 kg), SpO2 99 %.  ECOG: 1 General appearance: Well-appearing woman without distress. Head: Normocephalic, without obvious abnormality. No oral thrush noted. Neck: no adenopathy Lymph nodes: Cervical, supraclavicular, and axillary nodes normal. Heart:regular rate and rhythm, S1, S2 normal, no murmur, click, rub or gallop Lung:chest clear, no wheezing, rales, normal symmetric air entry Abdomin: Soft, nontender with good bowel sounds. Slightly distended without shifting dullness.  EXT:no erythema, induration, or nodules. No edema. Skin: No rashes or lesions. No ecchymosis noted.  Lab Results: Lab Results  Component Value Date   WBC 3.6 (L) 09/29/2015   HGB 9.6 (L) 09/29/2015   HCT 31.4 (L) 09/29/2015   MCV 100.3 09/29/2015   PLT 81 (L) 09/29/2015     Chemistry      Component Value  Date/Time   NA 139 09/29/2015 0945   K 4.8 09/29/2015 0945   CL 102 05/25/2015 0931   CO2 28 09/29/2015 0945   BUN 10.2 09/29/2015 0945   CREATININE 0.7 09/29/2015 0945      Component Value Date/Time   CALCIUM 9.8 09/29/2015 0945   ALKPHOS 80 09/29/2015 0945   AST 19 09/29/2015 0945   ALT 10 09/29/2015 0945   BILITOT 0.30 09/29/2015 0945       Results for SHAMARI, TROSTEL (MRN [id number]) as of 09/30/2015 09:46  Ref. Range 07/29/2015 11:16 08/19/2015 09:03 09/09/2015 09:24 09/29/2015 09:45  Cancer Antigen (CA) 125 Latest Ref Range: 0.0 - 38.1 U/mL 189.8 (H) 177.7 (H) 120.7 (H) 82.3 (H)     Impression and Plan:  60 year old woman with the following issues:  1. Peritoneal carcinomatosis with omental involvement. The pathology confirmed the presence of adenocarcinoma of likely GYN etiology. Her CA 125 was elevated at 333.5 on 09/19/2012. She is status post systemic chemotherapy with an excellent response utilizing carboplatin and Taxotere and a Avastin . This therapy was held in June of 2015  and currently on Avastin maintenance.   CT scan obtained on 05/05/2015 as well as her tumor markers showed clear progression. Her CA-125 is up to 189 and her CT scan showed increase in her peritoneal disease.   She is currently receiving salvage therapy with carboplatin single agent at AUC of 5 and after 4 cycles of therapy she had an excellent esponse. Her tumor marker decreased from initially 189 to currently 82.3. Her CT scan obtained on 09/29/2015 was personally reviewed and discussed with the patient and her husband. Her disease for the most part is stable and no evidence of progression noted.  The plan is to continue with the same dose and schedule and repeat imaging studies in 3 months.   2. IV access: Port-A-Cath is utilized for chemotherapy without complications. No issues were flushes or access.  3. Thrombocytopenia: Her platelets count is stable without any bleeding. She does have  baseline thrombocytopenia and maybe slightly exacerbated with carboplatin. We will continue to monitor this closely and consider dose reduction of carboplatin and the future.  4. Abdominal distention: No ascites noted on ultrasound unlikely related to peritoneal carcinomatosis. Clinically improving at this time.  5. Left breast mass: She status post biopsy with the pathology indicating T2 N1 disease with the tumor have ER positive, PR negative HER-2 negative. Her Ki-67 is 50%.  The plan is to treat with systemic chemotherapy and consider salvage mastectomy if she has a good response to her ovarian cancer.  8. Neutropenia: Neulasta will be added to each cycle of therapy. Complications associated with this therapy was discussed including arthralgias, myalgias and instructions to use Claritin was given to the patient. She continued to tolerate this very well.  9. Genetic counseling: She has been evaluated for possible genetic disorder but none has been identified.  10. The followup: She will in 3 weeks for the next cycle of therapy.    [facility], MD 8/11/20179:59 AM

## 2015-09-30 NOTE — Progress Notes (Signed)
Per MD note, ok to proceed with platelets 81.  Verified note with pharmacy.

## 2015-09-30 NOTE — Patient Instructions (Signed)
[facility] Discharge Instructions for Patients Receiving Chemotherapy  Today you received the following chemotherapy agents: Carboplatin   To help prevent nausea and vomiting after your treatment, we encourage you to take your nausea medication as directed.    If you develop nausea and vomiting that is not controlled by your nausea medication, call the clinic.   BELOW ARE SYMPTOMS THAT SHOULD BE REPORTED IMMEDIATELY:  *FEVER GREATER THAN 100.5 F  *CHILLS WITH OR WITHOUT FEVER  NAUSEA AND VOMITING THAT IS NOT CONTROLLED WITH YOUR NAUSEA MEDICATION  *UNUSUAL SHORTNESS OF BREATH  *UNUSUAL BRUISING OR BLEEDING  TENDERNESS IN MOUTH AND THROAT WITH OR WITHOUT PRESENCE OF ULCERS  *URINARY PROBLEMS  *BOWEL PROBLEMS  UNUSUAL RASH Items with * indicate a potential emergency and should be followed up as soon as possible.  Feel free to call the clinic you have any questions or concerns. The clinic phone number is [phone].  Please show the CHEMO ALERT CARD at check-in to the Emergency Department and triage nurse.   

## 2015-09-30 NOTE — Telephone Encounter (Signed)
Gave pt cal & avs °

## 2015-10-03 ENCOUNTER — Ambulatory Visit (HOSPITAL_BASED_OUTPATIENT_CLINIC_OR_DEPARTMENT_OTHER): Payer: PPO

## 2015-10-03 ENCOUNTER — Ambulatory Visit: Payer: PPO

## 2015-10-03 VITALS — BP 129/74 | HR 74 | Temp 98.0°F | Resp 20

## 2015-10-03 DIAGNOSIS — C482 Malignant neoplasm of peritoneum, unspecified: Secondary | ICD-10-CM

## 2015-10-03 DIAGNOSIS — Z5189 Encounter for other specified aftercare: Secondary | ICD-10-CM | POA: Diagnosis not present

## 2015-10-03 MED ORDER — PEGFILGRASTIM INJECTION 6 MG/0.6ML ~~LOC~~
6.0000 mg | PREFILLED_SYRINGE | Freq: Once | SUBCUTANEOUS | Status: AC
  Administered 2015-10-03: 6 mg via SUBCUTANEOUS
  Filled 2015-10-03: qty 0.6

## 2015-10-03 NOTE — Patient Instructions (Signed)
Pegfilgrastim injection What is this medicine? PEGFILGRASTIM (PEG fil gra stim) is a long-acting granulocyte colony-stimulating factor that stimulates the growth of neutrophils, a type of white blood cell important in the body's fight against infection. It is used to reduce the incidence of fever and infection in patients with certain types of cancer who are receiving chemotherapy that affects the bone marrow, and to increase survival after being exposed to high doses of radiation. This medicine may be used for other purposes; ask your health care provider or pharmacist if you have questions. What should I tell my health care provider before I take this medicine? They need to know if you have any of these conditions: -kidney disease -latex allergy -ongoing radiation therapy -sickle cell disease -skin reactions to acrylic adhesives (On-Body Injector only) -an unusual or allergic reaction to pegfilgrastim, filgrastim, other medicines, foods, dyes, or preservatives -pregnant or trying to get pregnant -breast-feeding How should I use this medicine? This medicine is for injection under the skin. If you get this medicine at home, you will be taught how to prepare and give the pre-filled syringe or how to use the On-body Injector. Refer to the patient Instructions for Use for detailed instructions. Use exactly as directed. Take your medicine at regular intervals. Do not take your medicine more often than directed. It is important that you put your used needles and syringes in a special sharps container. Do not put them in a trash can. If you do not have a sharps container, call your pharmacist or healthcare provider to get one. Talk to your pediatrician regarding the use of this medicine in children. While this drug may be prescribed for selected conditions, precautions do apply. Overdosage: If you think you have taken too much of this medicine contact a poison control center or emergency room at  once. NOTE: This medicine is only for you. Do not share this medicine with others. What if I miss a dose? It is important not to miss your dose. Call your doctor or health care professional if you miss your dose. If you miss a dose due to an On-body Injector failure or leakage, a new dose should be administered as soon as possible using a single prefilled syringe for manual use. What may interact with this medicine? Interactions have not been studied. Give your health care provider a list of all the medicines, herbs, non-prescription drugs, or dietary supplements you use. Also tell them if you smoke, drink alcohol, or use illegal drugs. Some items may interact with your medicine. This list may not describe all possible interactions. Give your health care provider a list of all the medicines, herbs, non-prescription drugs, or dietary supplements you use. Also tell them if you smoke, drink alcohol, or use illegal drugs. Some items may interact with your medicine. What should I watch for while using this medicine? You may need blood work done while you are taking this medicine. If you are going to need a MRI, CT scan, or other procedure, tell your doctor that you are using this medicine (On-Body Injector only). What side effects may I notice from receiving this medicine? Side effects that you should report to your doctor or health care professional as soon as possible: -allergic reactions like skin rash, itching or hives, swelling of the face, lips, or tongue -dizziness -fever -pain, redness, or irritation at site where injected -pinpoint red spots on the skin -red or dark-brown urine -shortness of breath or breathing problems -stomach or side pain, or pain   at the shoulder -swelling -tiredness -trouble passing urine or change in the amount of urine Side effects that usually do not require medical attention (report to your doctor or health care professional if they continue or are  bothersome): -bone pain -muscle pain This list may not describe all possible side effects. Call your doctor for medical advice about side effects. You may report side effects to FDA at 1-800-FDA-1088. Where should I keep my medicine? Keep out of the reach of children. Store pre-filled syringes in a refrigerator between 2 and 8 degrees C (36 and 46 degrees F). Do not freeze. Keep in carton to protect from light. Throw away this medicine if it is left out of the refrigerator for more than 48 hours. Throw away any unused medicine after the expiration date. NOTE: This sheet is a summary. It may not cover all possible information. If you have questions about this medicine, talk to your doctor, pharmacist, or health care provider.    2016, Elsevier/Gold Standard. (2014-02-25 14:30:14)  

## 2015-10-05 DIAGNOSIS — F3131 Bipolar disorder, current episode depressed, mild: Secondary | ICD-10-CM | POA: Diagnosis not present

## 2015-10-12 ENCOUNTER — Encounter: Payer: Self-pay | Admitting: Oncology

## 2015-10-17 ENCOUNTER — Encounter: Payer: Self-pay | Admitting: Oncology

## 2015-10-20 ENCOUNTER — Other Ambulatory Visit: Payer: Self-pay | Admitting: *Deleted

## 2015-10-21 ENCOUNTER — Ambulatory Visit (HOSPITAL_BASED_OUTPATIENT_CLINIC_OR_DEPARTMENT_OTHER): Payer: PPO | Admitting: Oncology

## 2015-10-21 ENCOUNTER — Other Ambulatory Visit: Payer: Self-pay | Admitting: *Deleted

## 2015-10-21 ENCOUNTER — Ambulatory Visit: Payer: PPO

## 2015-10-21 ENCOUNTER — Other Ambulatory Visit (HOSPITAL_BASED_OUTPATIENT_CLINIC_OR_DEPARTMENT_OTHER): Payer: PPO

## 2015-10-21 ENCOUNTER — Ambulatory Visit (HOSPITAL_BASED_OUTPATIENT_CLINIC_OR_DEPARTMENT_OTHER): Payer: PPO

## 2015-10-21 ENCOUNTER — Telehealth: Payer: Self-pay | Admitting: Oncology

## 2015-10-21 VITALS — BP 135/77 | HR 74 | Temp 98.4°F | Resp 18 | Ht 62.0 in | Wt 165.0 lb

## 2015-10-21 DIAGNOSIS — C50912 Malignant neoplasm of unspecified site of left female breast: Secondary | ICD-10-CM | POA: Diagnosis not present

## 2015-10-21 DIAGNOSIS — R140 Abdominal distension (gaseous): Secondary | ICD-10-CM

## 2015-10-21 DIAGNOSIS — C786 Secondary malignant neoplasm of retroperitoneum and peritoneum: Secondary | ICD-10-CM | POA: Diagnosis not present

## 2015-10-21 DIAGNOSIS — D696 Thrombocytopenia, unspecified: Secondary | ICD-10-CM | POA: Diagnosis not present

## 2015-10-21 DIAGNOSIS — C801 Malignant (primary) neoplasm, unspecified: Secondary | ICD-10-CM

## 2015-10-21 DIAGNOSIS — C482 Malignant neoplasm of peritoneum, unspecified: Secondary | ICD-10-CM

## 2015-10-21 DIAGNOSIS — Z95828 Presence of other vascular implants and grafts: Secondary | ICD-10-CM

## 2015-10-21 DIAGNOSIS — C50919 Malignant neoplasm of unspecified site of unspecified female breast: Secondary | ICD-10-CM

## 2015-10-21 DIAGNOSIS — Z5111 Encounter for antineoplastic chemotherapy: Secondary | ICD-10-CM

## 2015-10-21 LAB — CBC WITH DIFFERENTIAL/PLATELET
BASO%: 1.5 % (ref 0.0–2.0)
Basophils Absolute: 0 10*3/uL (ref 0.0–0.1)
EOS ABS: 0.1 10*3/uL (ref 0.0–0.5)
EOS%: 2.1 % (ref 0.0–7.0)
HCT: 31.5 % — ABNORMAL LOW (ref 34.8–46.6)
HGB: 9.8 g/dL — ABNORMAL LOW (ref 11.6–15.9)
LYMPH%: 39.5 % (ref 14.0–49.7)
MCH: 31.2 pg (ref 25.1–34.0)
MCHC: 31.2 g/dL — AB (ref 31.5–36.0)
MCV: 99.9 fL (ref 79.5–101.0)
MONO#: 0.3 10*3/uL (ref 0.1–0.9)
MONO%: 13.5 % (ref 0.0–14.0)
NEUT%: 43.4 % (ref 38.4–76.8)
NEUTROS ABS: 1.1 10*3/uL — AB (ref 1.5–6.5)
PLATELETS: 83 10*3/uL — AB (ref 145–400)
RBC: 3.15 10*6/uL — AB (ref 3.70–5.45)
RDW: 18.5 % — ABNORMAL HIGH (ref 11.2–14.5)
WBC: 2.4 10*3/uL — ABNORMAL LOW (ref 3.9–10.3)
lymph#: 1 10*3/uL (ref 0.9–3.3)

## 2015-10-21 LAB — COMPREHENSIVE METABOLIC PANEL
ALK PHOS: 77 U/L (ref 40–150)
ALT: 11 U/L (ref 0–55)
ANION GAP: 8 meq/L (ref 3–11)
AST: 27 U/L (ref 5–34)
Albumin: 3.4 g/dL — ABNORMAL LOW (ref 3.5–5.0)
BUN: 11.6 mg/dL (ref 7.0–26.0)
CO2: 26 meq/L (ref 22–29)
Calcium: 9.3 mg/dL (ref 8.4–10.4)
Chloride: 107 mEq/L (ref 98–109)
Creatinine: 0.7 mg/dL (ref 0.6–1.1)
Glucose: 85 mg/dl (ref 70–140)
Potassium: 4.4 mEq/L (ref 3.5–5.1)
Sodium: 141 mEq/L (ref 136–145)
TOTAL PROTEIN: 8 g/dL (ref 6.4–8.3)

## 2015-10-21 MED ORDER — GABAPENTIN 100 MG PO CAPS: 100.0000 mg | ORAL_CAPSULE | Freq: Three times a day (TID) | ORAL | 1 refills | Status: DC

## 2015-10-21 MED ORDER — SODIUM CHLORIDE 0.9 % IJ SOLN
10.0000 mL | INTRAMUSCULAR | Status: DC | PRN
  Administered 2015-10-21: 10 mL via INTRAVENOUS
  Filled 2015-10-21: qty 10

## 2015-10-21 MED ORDER — HEPARIN SOD (PORK) LOCK FLUSH 100 UNIT/ML IV SOLN
500.0000 [IU] | Freq: Once | INTRAVENOUS | Status: AC | PRN
  Administered 2015-10-21: 500 [IU]
  Filled 2015-10-21: qty 5

## 2015-10-21 MED ORDER — ALPRAZOLAM 0.5 MG PO TABS: 0.5000 mg | ORAL_TABLET | Freq: Every evening | ORAL | 0 refills | Status: DC | PRN

## 2015-10-21 MED ORDER — LORAZEPAM 1 MG PO TABS: 1.0000 mg | ORAL_TABLET | Freq: Four times a day (QID) | ORAL | 0 refills | Status: DC | PRN

## 2015-10-21 MED ORDER — SODIUM CHLORIDE 0.9 % IV SOLN
4.0000 mg | Freq: Once | INTRAVENOUS | Status: AC
  Administered 2015-10-21: 4 mg via INTRAVENOUS
  Filled 2015-10-21: qty 0.4

## 2015-10-21 MED ORDER — PALONOSETRON HCL INJECTION 0.25 MG/5ML
0.2500 mg | Freq: Once | INTRAVENOUS | Status: AC
  Administered 2015-10-21: 0.25 mg via INTRAVENOUS

## 2015-10-21 MED ORDER — CARBOPLATIN CHEMO INJECTION 600 MG/60ML
570.0000 mg | Freq: Once | INTRAVENOUS | Status: AC
  Administered 2015-10-21: 570 mg via INTRAVENOUS
  Filled 2015-10-21: qty 57

## 2015-10-21 MED ORDER — PALONOSETRON HCL INJECTION 0.25 MG/5ML
INTRAVENOUS | Status: AC
Start: 2015-10-21 — End: 2015-10-21
  Filled 2015-10-21: qty 5

## 2015-10-21 MED ORDER — SODIUM CHLORIDE 0.9% FLUSH
10.0000 mL | INTRAVENOUS | Status: DC | PRN
  Administered 2015-10-21: 10 mL
  Filled 2015-10-21: qty 10

## 2015-10-21 MED ORDER — CARBOPLATIN CHEMO INTRADERMAL TEST DOSE 100MCG/0.02ML
100.0000 ug | Freq: Once | INTRADERMAL | Status: AC
  Administered 2015-10-21: 100 ug via INTRADERMAL
  Filled 2015-10-21: qty 0.02

## 2015-10-21 MED ORDER — SODIUM CHLORIDE 0.9 % IV SOLN
Freq: Once | INTRAVENOUS | Status: AC
  Administered 2015-10-21: 13:00:00 via INTRAVENOUS

## 2015-10-21 NOTE — Patient Instructions (Signed)

## 2015-10-21 NOTE — Progress Notes (Signed)
Hematology and Oncology Follow Up Visit  [name] [id number] 1955-06-07 60 y.o. 10/21/2015 11:29 AM    Principle Diagnosis: 61 year old woman diagnosed with:  1. 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 This was diagnosed in July of 2014. 2. 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 The tumor is ER positive PR negative, HER-2 negative.   Prior Therapy:  She is status post paracentesis performed on 09/17/2012 with the cytology confirmed the presence of adenocarcinoma and immunohistochemical stains suggest GYN etiology. She is also status post lumpectomy for breast cancer diagnosed 25 years ago followed by radiation and chemotherapy under the care of Dr. Sonny Dandy. She did not receive any hormonal therapy. Chemotherapy utilizing carboplatin and Taxotere started on 10/01/2012. Avastin was added with cycle 4. This was discontinued in June of 2015. She is S/P Avastin maintenance only between June 2015 till March 2017. Therapy discontinued due to progression of disease. She is status post Doxil salvage chemotherapy therapy discontinued because of hypersensitivity reaction in March 2017.  Current therapy:  Carboplatin and AUC of 5 every 3 weeks cycle 1 started on 07/08/2015. She is here for cycle 6 of therapy.  Interim History: [name] presents today for a followup visit with her husband. Since her last visit, she reports no major changes in her health.  She tolerated carboplatin last cycle with no issues. She denied any nausea, vomiting or peripheral neuropathy. She did report increased agitation and insomnia on the nights after her chemotherapy. His thought it may be related to her steroid premedication.  She continues to have reasonable quality of life and performance status. She continues to be very active and remained very busy without decline in her  ability to do so. She does report some occasional abdominal distention and on satiety. Her weight is unchanged but does report periodic diarrhea.   She does not report any headaches, blurry vision, syncope or seizures. She denied any fever chills. She does report some chest discomfort but no dyspnea on exertion. Has not reported any shortness of breath or cough. She denied any pain with swallowing. She does not report any vomiting, constipation does report some occasional diarrhea. She denied any GYN bleeding. She reports no anxiety or depression. Remainder of the review of systems is unremarkable.  Medications: I have reviewed the patient's current medications.  Current Outpatient Prescriptions  Medication Sig Dispense Refill  . Acetylcarnitine HCl (ACETYL L-CARNITINE) 500 MG CAPS Take 1 capsule by mouth at bedtime.    . ALPRAZolam (XANAX) 0.5 MG tablet Take 1 tablet (0.5 mg total) by mouth at bedtime as needed for anxiety (1-2 tabs @@ hs and 1 tab day of chemotherapy treatments). 30 tablet 0  . divalproex (DEPAKOTE) 250 MG DR tablet Take 250 mg by mouth 2 (two) times daily. '500mg'$  in the morning and 1500 mg in the evening    . fluticasone (FLONASE) 50 MCG/ACT nasal spray INHALE 2 SPRAYS INTO EACH NOSTRIL EVERY NIGHT  11  . gabapentin (NEURONTIN) 100 MG capsule Take 1 capsule (100 mg total) by mouth 3 (three) times daily. 90 capsule 1  . lamoTRIgine (LAMICTAL) 100 MG tablet Take 100 mg by mouth 2 (two) times daily.    Marland Kitchen levothyroxine (SYNTHROID, LEVOTHROID) 100 MCG tablet Take 100 mcg by mouth daily before breakfast.     . lidocaine-prilocaine (EMLA) cream Apply topically as needed. Apply to port with  every chemotherapy. 30 g 0  . loperamide (IMODIUM A-D) 2 MG tablet Take 2 mg by mouth as needed for diarrhea or loose stools.    Marland Kitchen omeprazole (PRILOSEC) 40 MG capsule Take 40 mg by mouth.    . ondansetron (ZOFRAN ODT) 8 MG disintegrating tablet Take 1 tablet (8 mg total) by mouth every 8 (eight) hours  as needed for nausea or vomiting. 20 tablet 0  . ondansetron (ZOFRAN) 8 MG tablet TAKE 1 TABLET BY MOUTH EVERY 8 HOURS AS NEEDED FOR NAUSEA AND VOMITING 20 tablet 1  . prochlorperazine (COMPAZINE) 10 MG tablet Take 1 tablet (10 mg total) by mouth every 6 (six) hours as needed for nausea or vomiting. 30 tablet 1  . rOPINIRole (REQUIP) 1 MG tablet Take 1 mg by mouth 3 (three) times daily. Patient takes 1.5 mg at bedtime     No current facility-administered medications for this visit.    Facility-Administered Medications Ordered in Other Visits  Medication Dose Route Frequency Provider Last Rate Last Dose  . heparin lock flush 100 unit/mL  500 Units Intravenous Once Wyatt Portela, MD      . sodium chloride flush (NS) 0.9 % injection 10 mL  10 mL Intravenous PRN Wyatt Portela, MD         Allergies:  Allergies  Allergen Reactions  . Doxil [Doxorubicin Hcl Liposomal] Anaphylaxis    1st Doxil.   . Pollen Extract Other (See Comments)    Pollen and grass causes a lot sneezing    Past Medical History, Surgical history, Social history, and Family History were reviewed and updated.   Physical Exam: Blood pressure 135/77, pulse 74, temperature 98.4 F (36.9 C), temperature source Oral, resp. rate 18, height '5\' 2"'$  (1.575 m), weight 165 lb (74.8 kg), SpO2 100 %.  ECOG: 1 General appearance: Alert, awake woman without distress. Head: Normocephalic, without obvious abnormality. No oral thrush noted. Neck: no adenopathy Lymph nodes: Cervical, supraclavicular, and axillary nodes normal. Heart:regular rate and rhythm, S1, S2 normal, no murmur, click, rub or gallop Lung:chest clear, no wheezing, rales, normal symmetric air entry Abdomin: Soft, nontender with good bowel sounds. Distended without rebound or guarding. EXT:no erythema, induration, or nodules. No edema. Skin: No rashes or lesions. No ecchymosis noted.  Lab Results: Lab Results  Component Value Date   WBC 2.4 (L) 10/21/2015   HGB  9.8 (L) 10/21/2015   HCT 31.5 (L) 10/21/2015   MCV 99.9 10/21/2015   PLT 83 (L) 10/21/2015     Chemistry      Component Value Date/Time   NA 139 09/29/2015 0945   K 4.8 09/29/2015 0945   CL 102 05/25/2015 0931   CO2 28 09/29/2015 0945   BUN 10.2 09/29/2015 0945   CREATININE 0.7 09/29/2015 0945      Component Value Date/Time   CALCIUM 9.8 09/29/2015 0945   ALKPHOS 80 09/29/2015 0945   AST 19 09/29/2015 0945   ALT 10 09/29/2015 0945   BILITOT 0.30 09/29/2015 0945        Results for Julie, Padilla (MRN [id number]) as of 10/21/2015 11:09  Ref. Range 08/19/2015 09:03 09/09/2015 09:24 09/29/2015 09:45  Cancer Antigen (CA) 125 Latest Ref Range: 0.0 - 38.1 U/mL 177.7 (H) 120.7 (H) 82.3 (H)     Impression and Plan:  60 year old woman with the following issues:  1. Peritoneal carcinomatosis with omental involvement. The pathology confirmed the presence of adenocarcinoma of likely GYN etiology. Her CA 125 was elevated at 333.5 on  09/19/2012. She is status post systemic chemotherapy with an excellent response utilizing carboplatin and Taxotere and a Avastin . This therapy was held in June of 2015 and currently on Avastin maintenance.   CT scan obtained on 05/05/2015 as well as her tumor markers showed clear progression. Her CA-125 is up to 189 and her CT scan showed increase in her peritoneal disease.   She is currently receiving salvage therapy with carboplatin single agent at AUC of 5 and after 4 cycles of therapy she had an excellent esponse. Her tumor marker decreased from initially 189 to currently 82.3.   Her CT scan obtained on 09/29/2015 showed stable disease.   The plan is to continue with the same dose and schedule and repeat imaging studies in November 2017. I will decrease his dexamethasone premedication to decrease her agitation and insomnia.   2. IV access: Port-A-Cath is utilized for chemotherapy without complications. She has no issues continuing using the  Port-A-Cath.  3. Thrombocytopenia: Her platelets count is stable without any bleeding. She does have baseline thrombocytopenia and maybe slightly exacerbated with carboplatin. No further decline noted.  4. Abdominal distention: No ascites noted on ultrasound unlikely related to peritoneal carcinomatosis. Clinically improving at this time.  5. Left breast mass: She status post biopsy with the pathology indicating T2 N1 disease with the tumor have ER positive, PR negative HER-2 negative. Her Ki-67 is 50%.  The plan is to treat with systemic chemotherapy and consider salvage mastectomy if she has a good response to her ovarian cancer.  8. Neutropenia: Neulasta will be added to each cycle of therapy. Complications associated with this therapy was discussed including arthralgias, myalgias and instructions to use Claritin was given to the patient. She is reporting since last visit.  9. Genetic counseling: She has been evaluated for possible genetic disorder but none has been identified.  10. The followup: She will in 3 weeks for the next cycle of therapy.    [facility], MD 9/1/201711:29 AM

## 2015-10-21 NOTE — Progress Notes (Signed)
Per Dr Alen Blew ok to treat with lab values.

## 2015-10-21 NOTE — Progress Notes (Signed)
Per dr shadad, okay to treat today despite counts. 

## 2015-10-21 NOTE — Telephone Encounter (Signed)
GAVE PATIENT AVS REPORT AND APPOINTMENTS FOR September AND October. PER DR SHADAD OK TO MOVE TX TO THURSDAYS PER PATIENT REQUEST.

## 2015-10-21 NOTE — Patient Instructions (Signed)
[facility] Discharge Instructions for Patients Receiving Chemotherapy  Today you received the following chemotherapy agents: Carboplatin   To help prevent nausea and vomiting after your treatment, we encourage you to take your nausea medication as directed.    If you develop nausea and vomiting that is not controlled by your nausea medication, call the clinic.   BELOW ARE SYMPTOMS THAT SHOULD BE REPORTED IMMEDIATELY:  *FEVER GREATER THAN 100.5 F  *CHILLS WITH OR WITHOUT FEVER  NAUSEA AND VOMITING THAT IS NOT CONTROLLED WITH YOUR NAUSEA MEDICATION  *UNUSUAL SHORTNESS OF BREATH  *UNUSUAL BRUISING OR BLEEDING  TENDERNESS IN MOUTH AND THROAT WITH OR WITHOUT PRESENCE OF ULCERS  *URINARY PROBLEMS  *BOWEL PROBLEMS  UNUSUAL RASH Items with * indicate a potential emergency and should be followed up as soon as possible.  Feel free to call the clinic you have any questions or concerns. The clinic phone number is [phone].  Please show the CHEMO ALERT CARD at check-in to the Emergency Department and triage nurse.   

## 2015-10-22 ENCOUNTER — Ambulatory Visit (HOSPITAL_BASED_OUTPATIENT_CLINIC_OR_DEPARTMENT_OTHER): Payer: PPO

## 2015-10-22 VITALS — BP 130/80 | HR 76 | Temp 98.6°F | Resp 18

## 2015-10-22 DIAGNOSIS — D701 Agranulocytosis secondary to cancer chemotherapy: Secondary | ICD-10-CM

## 2015-10-22 DIAGNOSIS — C801 Malignant (primary) neoplasm, unspecified: Secondary | ICD-10-CM | POA: Diagnosis not present

## 2015-10-22 DIAGNOSIS — C482 Malignant neoplasm of peritoneum, unspecified: Secondary | ICD-10-CM

## 2015-10-22 LAB — CA 125: Cancer Antigen (CA) 125: 60.5 U/mL — ABNORMAL HIGH (ref 0.0–38.1)

## 2015-10-22 MED ORDER — PEGFILGRASTIM INJECTION 6 MG/0.6ML ~~LOC~~
6.0000 mg | PREFILLED_SYRINGE | Freq: Once | SUBCUTANEOUS | Status: AC
  Administered 2015-10-22: 6 mg via SUBCUTANEOUS

## 2015-10-22 NOTE — Patient Instructions (Signed)
Pegfilgrastim injection What is this medicine? PEGFILGRASTIM (PEG fil gra stim) is a long-acting granulocyte colony-stimulating factor that stimulates the growth of neutrophils, a type of white blood cell important in the body's fight against infection. It is used to reduce the incidence of fever and infection in patients with certain types of cancer who are receiving chemotherapy that affects the bone marrow, and to increase survival after being exposed to high doses of radiation. This medicine may be used for other purposes; ask your health care provider or pharmacist if you have questions. What should I tell my health care provider before I take this medicine? They need to know if you have any of these conditions: -kidney disease -latex allergy -ongoing radiation therapy -sickle cell disease -skin reactions to acrylic adhesives (On-Body Injector only) -an unusual or allergic reaction to pegfilgrastim, filgrastim, other medicines, foods, dyes, or preservatives -pregnant or trying to get pregnant -breast-feeding How should I use this medicine? This medicine is for injection under the skin. If you get this medicine at home, you will be taught how to prepare and give the pre-filled syringe or how to use the On-body Injector. Refer to the patient Instructions for Use for detailed instructions. Use exactly as directed. Take your medicine at regular intervals. Do not take your medicine more often than directed. It is important that you put your used needles and syringes in a special sharps container. Do not put them in a trash can. If you do not have a sharps container, call your pharmacist or healthcare provider to get one. Talk to your pediatrician regarding the use of this medicine in children. While this drug may be prescribed for selected conditions, precautions do apply. Overdosage: If you think you have taken too much of this medicine contact a poison control center or emergency room at  once. NOTE: This medicine is only for you. Do not share this medicine with others. What if I miss a dose? It is important not to miss your dose. Call your doctor or health care professional if you miss your dose. If you miss a dose due to an On-body Injector failure or leakage, a new dose should be administered as soon as possible using a single prefilled syringe for manual use. What may interact with this medicine? Interactions have not been studied. Give your health care provider a list of all the medicines, herbs, non-prescription drugs, or dietary supplements you use. Also tell them if you smoke, drink alcohol, or use illegal drugs. Some items may interact with your medicine. This list may not describe all possible interactions. Give your health care provider a list of all the medicines, herbs, non-prescription drugs, or dietary supplements you use. Also tell them if you smoke, drink alcohol, or use illegal drugs. Some items may interact with your medicine. What should I watch for while using this medicine? You may need blood work done while you are taking this medicine. If you are going to need a MRI, CT scan, or other procedure, tell your doctor that you are using this medicine (On-Body Injector only). What side effects may I notice from receiving this medicine? Side effects that you should report to your doctor or health care professional as soon as possible: -allergic reactions like skin rash, itching or hives, swelling of the face, lips, or tongue -dizziness -fever -pain, redness, or irritation at site where injected -pinpoint red spots on the skin -red or dark-brown urine -shortness of breath or breathing problems -stomach or side pain, or pain   at the shoulder -swelling -tiredness -trouble passing urine or change in the amount of urine Side effects that usually do not require medical attention (report to your doctor or health care professional if they continue or are  bothersome): -bone pain -muscle pain This list may not describe all possible side effects. Call your doctor for medical advice about side effects. You may report side effects to FDA at 1-800-FDA-1088. Where should I keep my medicine? Keep out of the reach of children. Store pre-filled syringes in a refrigerator between 2 and 8 degrees C (36 and 46 degrees F). Do not freeze. Keep in carton to protect from light. Throw away this medicine if it is left out of the refrigerator for more than 48 hours. Throw away any unused medicine after the expiration date. NOTE: This sheet is a summary. It may not cover all possible information. If you have questions about this medicine, talk to your doctor, pharmacist, or health care provider.    2016, Elsevier/Gold Standard. (2014-02-25 14:30:14)  

## 2015-10-25 ENCOUNTER — Emergency Department (HOSPITAL_COMMUNITY): Payer: PPO

## 2015-10-25 ENCOUNTER — Telehealth: Payer: Self-pay | Admitting: *Deleted

## 2015-10-25 ENCOUNTER — Inpatient Hospital Stay (HOSPITAL_COMMUNITY)
Admission: EM | Admit: 2015-10-25 | Discharge: 2015-10-27 | DRG: 246 | Disposition: A | Discharge status: Home / Self Care | Payer: PPO | Attending: Internal Medicine | Admitting: Internal Medicine

## 2015-10-25 ENCOUNTER — Emergency Department (HOSPITAL_BASED_OUTPATIENT_CLINIC_OR_DEPARTMENT_OTHER): Admit: 2015-10-25 | Discharge: 2015-10-25 | Disposition: A | Discharge status: Home / Self Care | Payer: PPO

## 2015-10-25 ENCOUNTER — Other Ambulatory Visit: Payer: Self-pay

## 2015-10-25 ENCOUNTER — Encounter (HOSPITAL_COMMUNITY): Payer: Self-pay | Admitting: Internal Medicine

## 2015-10-25 DIAGNOSIS — Z7951 Long term (current) use of inhaled steroids: Secondary | ICD-10-CM

## 2015-10-25 DIAGNOSIS — D61810 Antineoplastic chemotherapy induced pancytopenia: Secondary | ICD-10-CM | POA: Diagnosis not present

## 2015-10-25 DIAGNOSIS — I2510 Atherosclerotic heart disease of native coronary artery without angina pectoris: Secondary | ICD-10-CM | POA: Diagnosis present

## 2015-10-25 DIAGNOSIS — C50911 Malignant neoplasm of unspecified site of right female breast: Secondary | ICD-10-CM | POA: Diagnosis present

## 2015-10-25 DIAGNOSIS — R0789 Other chest pain: Secondary | ICD-10-CM | POA: Diagnosis not present

## 2015-10-25 DIAGNOSIS — M8580 Other specified disorders of bone density and structure, unspecified site: Secondary | ICD-10-CM | POA: Diagnosis not present

## 2015-10-25 DIAGNOSIS — E038 Other specified hypothyroidism: Secondary | ICD-10-CM | POA: Diagnosis not present

## 2015-10-25 DIAGNOSIS — Z955 Presence of coronary angioplasty implant and graft: Secondary | ICD-10-CM

## 2015-10-25 DIAGNOSIS — R7989 Other specified abnormal findings of blood chemistry: Secondary | ICD-10-CM | POA: Diagnosis not present

## 2015-10-25 DIAGNOSIS — Z923 Personal history of irradiation: Secondary | ICD-10-CM | POA: Diagnosis not present

## 2015-10-25 DIAGNOSIS — R079 Chest pain, unspecified: Secondary | ICD-10-CM | POA: Diagnosis not present

## 2015-10-25 DIAGNOSIS — Z853 Personal history of malignant neoplasm of breast: Secondary | ICD-10-CM

## 2015-10-25 DIAGNOSIS — D72829 Elevated white blood cell count, unspecified: Secondary | ICD-10-CM | POA: Diagnosis present

## 2015-10-25 DIAGNOSIS — T458X5A Adverse effect of other primarily systemic and hematological agents, initial encounter: Secondary | ICD-10-CM | POA: Diagnosis present

## 2015-10-25 DIAGNOSIS — Z79899 Other long term (current) drug therapy: Secondary | ICD-10-CM

## 2015-10-25 DIAGNOSIS — E039 Hypothyroidism, unspecified: Secondary | ICD-10-CM | POA: Diagnosis not present

## 2015-10-25 DIAGNOSIS — F419 Anxiety disorder, unspecified: Secondary | ICD-10-CM | POA: Diagnosis present

## 2015-10-25 DIAGNOSIS — F319 Bipolar disorder, unspecified: Secondary | ICD-10-CM | POA: Diagnosis not present

## 2015-10-25 DIAGNOSIS — C50919 Malignant neoplasm of unspecified site of unspecified female breast: Secondary | ICD-10-CM | POA: Diagnosis present

## 2015-10-25 DIAGNOSIS — F312 Bipolar disorder, current episode manic severe with psychotic features: Secondary | ICD-10-CM

## 2015-10-25 DIAGNOSIS — C786 Secondary malignant neoplasm of retroperitoneum and peritoneum: Secondary | ICD-10-CM | POA: Diagnosis not present

## 2015-10-25 DIAGNOSIS — E785 Hyperlipidemia, unspecified: Secondary | ICD-10-CM | POA: Diagnosis present

## 2015-10-25 DIAGNOSIS — R778 Other specified abnormalities of plasma proteins: Secondary | ICD-10-CM | POA: Diagnosis present

## 2015-10-25 DIAGNOSIS — F329 Major depressive disorder, single episode, unspecified: Secondary | ICD-10-CM | POA: Diagnosis not present

## 2015-10-25 DIAGNOSIS — D61818 Other pancytopenia: Secondary | ICD-10-CM | POA: Diagnosis present

## 2015-10-25 DIAGNOSIS — G4733 Obstructive sleep apnea (adult) (pediatric): Secondary | ICD-10-CM | POA: Diagnosis not present

## 2015-10-25 DIAGNOSIS — I214 Non-ST elevation (NSTEMI) myocardial infarction: Secondary | ICD-10-CM | POA: Diagnosis not present

## 2015-10-25 DIAGNOSIS — I209 Angina pectoris, unspecified: Secondary | ICD-10-CM | POA: Diagnosis not present

## 2015-10-25 LAB — BASIC METABOLIC PANEL
Anion gap: 6 (ref 5–15)
BUN: 14 mg/dL (ref 6–20)
CALCIUM: 9.1 mg/dL (ref 8.9–10.3)
CO2: 30 mmol/L (ref 22–32)
Chloride: 103 mmol/L (ref 101–111)
Creatinine, Ser: 0.59 mg/dL (ref 0.44–1.00)
GFR calc Af Amer: >60 mL/min (ref >60)
GLUCOSE: 116 mg/dL — AB (ref 65–99)
Potassium: 4.3 mmol/L (ref 3.5–5.1)
SODIUM: 139 mmol/L (ref 135–145)

## 2015-10-25 LAB — CBC
HCT: 32.9 % — ABNORMAL LOW (ref 36.0–46.0)
Hemoglobin: 10.1 g/dL — ABNORMAL LOW (ref 12.0–15.0)
MCH: 31.7 pg (ref 26.0–34.0)
MCHC: 30.7 g/dL (ref 30.0–36.0)
MCV: 103.1 fL — ABNORMAL HIGH (ref 78.0–100.0)
PLATELETS: 77 10*3/uL — AB (ref 150–400)
RBC: 3.19 MIL/uL — ABNORMAL LOW (ref 3.87–5.11)
RDW: 16.1 % — AB (ref 11.5–15.5)
WBC: 18.1 10*3/uL — ABNORMAL HIGH (ref 4.0–10.5)

## 2015-10-25 LAB — I-STAT TROPONIN, ED: TROPONIN I, POC: 0.41 ng/mL — AB (ref 0.00–0.08)

## 2015-10-25 LAB — D-DIMER, QUANTITATIVE: D-Dimer, Quant: 1.58 ug/mL-FEU — ABNORMAL HIGH (ref 0.00–0.50)

## 2015-10-25 MED ORDER — SODIUM CHLORIDE 0.9% FLUSH
3.0000 mL | Freq: Two times a day (BID) | INTRAVENOUS | Status: DC
  Administered 2015-10-27 (×2): 3 mL via INTRAVENOUS

## 2015-10-25 MED ORDER — SODIUM CHLORIDE 0.9% FLUSH
10.0000 mL | INTRAVENOUS | Status: DC | PRN
  Administered 2015-10-25 – 2015-10-27 (×3): 10 mL
  Filled 2015-10-25 (×3): qty 40

## 2015-10-25 MED ORDER — NITROGLYCERIN 0.4 MG SL SUBL: 0.4000 mg | SUBLINGUAL_TABLET | SUBLINGUAL | Status: DC | PRN

## 2015-10-25 MED ORDER — FLUTICASONE PROPIONATE 50 MCG/ACT NA SUSP
2.0000 | Freq: Every day | NASAL | Status: DC
  Administered 2015-10-25: 2 via NASAL
  Filled 2015-10-25: qty 16

## 2015-10-25 MED ORDER — PROCHLORPERAZINE MALEATE 10 MG PO TABS
10.0000 mg | ORAL_TABLET | Freq: Four times a day (QID) | ORAL | Status: DC | PRN
  Filled 2015-10-25: qty 1

## 2015-10-25 MED ORDER — ROPINIROLE HCL 1 MG PO TABS
1.0000 mg | ORAL_TABLET | Freq: Every day | ORAL | Status: DC
  Filled 2015-10-25: qty 1

## 2015-10-25 MED ORDER — DIVALPROEX SODIUM 500 MG PO DR TAB
750.0000 mg | DELAYED_RELEASE_TABLET | Freq: Every day | ORAL | Status: DC
  Administered 2015-10-25 – 2015-10-27 (×2): 750 mg via ORAL
  Filled 2015-10-25: qty 1
  Filled 2015-10-25: qty 3

## 2015-10-25 MED ORDER — DIVALPROEX SODIUM 500 MG PO DR TAB
500.0000 mg | DELAYED_RELEASE_TABLET | Freq: Every day | ORAL | Status: DC
  Administered 2015-10-26 – 2015-10-27 (×2): 500 mg via ORAL
  Filled 2015-10-25 (×2): qty 1

## 2015-10-25 MED ORDER — IOPAMIDOL (ISOVUE-370) INJECTION 76%
100.0000 mL | Freq: Once | INTRAVENOUS | Status: AC | PRN
  Administered 2015-10-25: 100 mL via INTRAVENOUS

## 2015-10-25 MED ORDER — LAMOTRIGINE 100 MG PO TABS
100.0000 mg | ORAL_TABLET | Freq: Two times a day (BID) | ORAL | Status: DC
  Administered 2015-10-25 – 2015-10-27 (×4): 100 mg via ORAL
  Filled 2015-10-25 (×4): qty 1

## 2015-10-25 MED ORDER — ONDANSETRON HCL 4 MG PO TABS: 4.0000 mg | ORAL_TABLET | Freq: Four times a day (QID) | ORAL | Status: DC | PRN

## 2015-10-25 MED ORDER — ALPRAZOLAM 0.5 MG PO TABS
0.5000 mg | ORAL_TABLET | Freq: Two times a day (BID) | ORAL | Status: DC
  Administered 2015-10-26: 0.5 mg via ORAL
  Administered 2015-10-27: 1 mg via ORAL
  Filled 2015-10-25: qty 1
  Filled 2015-10-25 (×2): qty 2

## 2015-10-25 MED ORDER — PANTOPRAZOLE SODIUM 40 MG PO TBEC
40.0000 mg | DELAYED_RELEASE_TABLET | Freq: Every day | ORAL | Status: DC
  Administered 2015-10-26 – 2015-10-27 (×2): 40 mg via ORAL
  Filled 2015-10-25 (×2): qty 1

## 2015-10-25 MED ORDER — ONDANSETRON HCL 4 MG/2ML IJ SOLN
4.0000 mg | Freq: Four times a day (QID) | INTRAMUSCULAR | Status: DC | PRN
  Administered 2015-10-26: 4 mg via INTRAVENOUS
  Filled 2015-10-25: qty 2

## 2015-10-25 MED ORDER — LIDOCAINE-PRILOCAINE 2.5-2.5 % EX CREA
TOPICAL_CREAM | CUTANEOUS | Status: DC | PRN
  Filled 2015-10-25: qty 5

## 2015-10-25 MED ORDER — LORAZEPAM 0.5 MG PO TABS: 1.0000 mg | ORAL_TABLET | Freq: Four times a day (QID) | ORAL | Status: DC | PRN

## 2015-10-25 MED ORDER — NITROGLYCERIN 0.4 MG SL SUBL
0.4000 mg | SUBLINGUAL_TABLET | Freq: Once | SUBLINGUAL | Status: AC
  Administered 2015-10-25: 0.4 mg via SUBLINGUAL
  Filled 2015-10-25: qty 1

## 2015-10-25 MED ORDER — GABAPENTIN 100 MG PO CAPS
100.0000 mg | ORAL_CAPSULE | Freq: Three times a day (TID) | ORAL | Status: DC
  Administered 2015-10-25 – 2015-10-27 (×5): 100 mg via ORAL
  Filled 2015-10-25 (×5): qty 1

## 2015-10-25 MED ORDER — ENOXAPARIN SODIUM 40 MG/0.4ML ~~LOC~~ SOLN: 40.0000 mg | SUBCUTANEOUS | Status: DC

## 2015-10-25 MED ORDER — ACETAMINOPHEN 325 MG PO TABS
650.0000 mg | ORAL_TABLET | Freq: Four times a day (QID) | ORAL | Status: DC | PRN
  Administered 2015-10-26: 650 mg via ORAL
  Filled 2015-10-25: qty 2

## 2015-10-25 MED ORDER — LEVOTHYROXINE SODIUM 100 MCG PO TABS
100.0000 ug | ORAL_TABLET | Freq: Every day | ORAL | Status: DC
  Administered 2015-10-26 – 2015-10-27 (×2): 100 ug via ORAL
  Filled 2015-10-25 (×2): qty 1

## 2015-10-25 MED ORDER — ROPINIROLE HCL 1 MG PO TABS
1.5000 mg | ORAL_TABLET | Freq: Every day | ORAL | Status: DC
  Administered 2015-10-25 – 2015-10-27 (×2): 1.5 mg via ORAL
  Filled 2015-10-25 (×2): qty 1

## 2015-10-25 MED ORDER — ROPINIROLE HCL 0.5 MG PO TABS
0.5000 mg | ORAL_TABLET | Freq: Every day | ORAL | Status: DC
  Filled 2015-10-25: qty 1

## 2015-10-25 NOTE — ED Triage Notes (Signed)
Pt states that she has had chest and L arm discomfort intermittently in the past 3 months that came back today and worsened. Current Breast CA pt receiving tx by Dr. Barbaraann Faster. Last Chemo was Friday. Alert and oriented.

## 2015-10-25 NOTE — Progress Notes (Signed)
*  PRELIMINARY RESULTS* Vascular Ultrasound Left upper extremity venous duplex has been completed.  Preliminary findings: No evidence of DVT or superficial thrombosis.  Landry Mellow, RDMS, RVT  10/25/2015, 6:51 PM

## 2015-10-25 NOTE — ED Provider Notes (Signed)
[facility] DEPT Provider Note   CSN: [id number] Arrival date & time: 10/25/15  1536     History   Chief Complaint Chief Complaint  Patient presents with  . Arm Pain  . Chest Pain    HPI [name] is a 60 y.o. female.  HPI Patient presents with episodic left chest pain. Comes and goes. Not associated with exertion. Does come on with rest. No fevers or chills. No cough. She has a history of active lung cancer. It is dull in her left chest and goes for left arm at times. Came back today is worsened. His had intermittently over the last 3 months. She is currently on chemotherapy. States she had a stress test for the same recently and it was found not to be her heart. No swelling or legs. Patient Active Problem List   Diagnosis Date Noted  . Chest pain 10/25/2015  . Pancytopenia ([place]) 10/25/2015  . Elevated troponin 10/25/2015  . Leukocytosis 10/25/2015  . Chest wall pain 07/15/2015  . Genetic testing 07/04/2015  . Port catheter in place 06/16/2015  . Dizziness 03/29/2015  . 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 Malignant neoplasm of female breast ([place]) 09/19/2012  . Lithium use 09/17/2011  . Abnormal coordination 09/17/2011  . Premature menopause   . Depression   . Anxiety   . Bipolar disorder ([place])   . ADD (attention deficit disorder)   . Diabetes mellitus   . NONSPECIFIC ABNORMAL ELECTROCARDIOGRAM 11/04/2009  . ALLERGIC RHINITIS 09/01/2009  . DIAB W/O COMP TYPE II/UNS NOT STATED UNCNTRL 07/28/2008  . NONSPEC ELEVATION OF LEVELS OF TRANSAMINASE/LDH 07/28/2008  . MOTOR RESTLESSNESS 02/19/2008  . Internal hemorrhoids without mention of complication [id number]  . ESOPHAGEAL STRICTURE 09/15/2007  . Obstructive sleep apnea 07/21/2007  . Hyperlipidemia 07/07/2007  . Osteopenia 02/20/2007  . Hypothyroidism 10/01/2006  . BREAST CANCER, HX OF 08/12/2006    Past Surgical History:  Procedure Laterality Date  . BREAST SURGERY  1989   RIGHT  LUMPECTOMY, RADIATION AND CHEMO  . FOOT SURGERY  2013   BILATERAL   . HYSTEROSCOPY  2011   [place]  . PELVIC LAPAROSCOPY/ Hysteroscopy  1996    OB History    Gravida Para Term Preterm AB Living   0             SAB TAB Ectopic Multiple Live Births                   Home Medications    Prior to Admission medications   Medication Sig Start Date End Date Taking? Authorizing Provider  Acetylcarnitine HCl (ACETYL L-CARNITINE PO) Take 2,000 mg by mouth at bedtime.   Yes Historical Provider, MD  ALPRAZolam Duanne Moron) 0.5 MG tablet Take 1 tablet (0.5 mg total) by mouth at bedtime as needed for anxiety (1-2 tabs @@ hs and 1 tab day of chemotherapy treatments). Patient taking differently: Take 0.5-1 mg by mouth 2 (two) times daily.  10/21/15  Yes Wyatt Portela, MD  divalproex (DEPAKOTE) 250 MG DR tablet Take 500-750 mg by mouth 2 (two) times daily. Take 500mg s in the morning and 750mg s at night   Yes Historical Provider, MD  fluticasone (FLONASE) 50 MCG/ACT nasal spray INHALE 2 SPRAYS INTO EACH NOSTRIL EVERY NIGHT 08/25/15  Yes Historical Provider, MD  gabapentin (NEURONTIN) 100 MG capsule Take 1 capsule (100 mg total) by mouth 3 (three) times daily. 10/21/15  Yes Wyatt Portela, MD  lamoTRIgine (LAMICTAL) 100 MG tablet Take 100 mg  by mouth 2 (two) times daily.   Yes Historical Provider, MD  levothyroxine (SYNTHROID, LEVOTHROID) 100 MCG tablet Take 100 mcg by mouth daily before breakfast.    Yes Historical Provider, MD  lidocaine-prilocaine (EMLA) cream Apply topically as needed. Apply to port with every chemotherapy. 08/19/15  Yes Owens Shark, NP  loperamide (IMODIUM A-D) 2 MG tablet Take 2 mg by mouth as needed for diarrhea or loose stools.   Yes Historical Provider, MD  omeprazole (PRILOSEC) 40 MG capsule Take 40 mg by mouth. 06/24/15  Yes Historical Provider, MD  ondansetron (ZOFRAN) 8 MG tablet TAKE 1 TABLET BY MOUTH EVERY 8 HOURS AS NEEDED FOR NAUSEA AND VOMITING 05/18/15  Yes Wyatt Portela, MD    prochlorperazine (COMPAZINE) 10 MG tablet Take 1 tablet (10 mg total) by mouth every 6 (six) hours as needed for nausea or vomiting. 07/08/15  Yes Wyatt Portela, MD  rOPINIRole (REQUIP) 0.5 MG tablet Take 0.5 mg by mouth at bedtime. Take 1 to 3 hours before bedtime   Yes Historical Provider, MD  rOPINIRole (REQUIP) 1 MG tablet Take 1 mg by mouth at bedtime.    Yes Historical Provider, MD  LORazepam (ATIVAN) 1 MG tablet Take 1 tablet (1 mg total) by mouth every 6 (six) hours as needed for anxiety. 10/21/15   Wyatt Portela, MD    Family History Family History  Problem Relation Age of Onset  . Breast cancer Maternal Aunt 29  . Diabetes Maternal Grandmother   . Heart disease Maternal Grandmother   . Heart disease Maternal Grandfather   . Hypertension Paternal Grandfather   . Heart Problems Paternal Grandfather   . Breast cancer Maternal Aunt     dx. early 61s; had negative GT approx 10 years ago  . Breast cancer Maternal Aunt     dx. 19s with recurrence  . Ovarian cancer Neg Hx   . Colon cancer Neg Hx   . Allergies Father   . Heart disease Mother   . Other Mother     hx of hysterectomy   . Liver cancer Cousin 72    +EtOH  . Cancer Cousin     paternal 1st cousin, once-removed dx. NOS cancer (maybe ovarian) in late 30s-40s  . Cancer Cousin     female paternal 2nd cousin d. of NOS cancer in her 20s-early 36s    Social History Social History  Substance Use Topics  . Smoking status: Never Smoker  . Smokeless tobacco: Never Used  . Alcohol use 0.0 oz/week     Comment: very rarely - maybe 1 glass wine every 3 mos     Allergies   Doxil [doxorubicin hcl liposomal] and Pollen extract   Review of Systems Review of Systems  Constitutional: Negative for appetite change.  HENT: Negative for facial swelling.   Respiratory: Positive for chest tightness. Negative for shortness of breath.   Cardiovascular: Positive for chest pain. Negative for palpitations and leg swelling.   Gastrointestinal: Negative for abdominal pain.  Genitourinary: Negative for flank pain.  Musculoskeletal: Negative for back pain.  Skin: Negative for wound.  Neurological: Negative for light-headedness.  Psychiatric/Behavioral: Negative for confusion.     Physical Exam Updated Vital Signs BP 111/81 (BP Location: Right Arm)   Pulse 82   Temp 98.9 F (37.2 C) (Oral)   Resp 18   Ht 5\' 2"  (1.575 m)   Wt 167 lb (75.8 kg)   SpO2 95%   BMI 30.54 kg/m   Physical  Exam  Constitutional: She appears well-developed.  HENT:  Head: Atraumatic.  Eyes: EOM are normal.  Neck: Neck supple. No JVD present.  Cardiovascular: Normal rate.   Pulmonary/Chest: Effort normal.  Port-A-Cath to right chest wall. Tenderness to left anterior chest wall without rash. Some mild tenderness over left arm also. Pulse intact in left wrist.  Abdominal: Soft. There is no tenderness.  Musculoskeletal: She exhibits no edema.  Neurological: She is alert.  Skin: Skin is warm. Capillary refill takes less than 2 seconds.  Psychiatric: She has a normal mood and affect.     ED Treatments / Results  Labs (all labs ordered are listed, but only abnormal results are displayed) Labs Reviewed  BASIC METABOLIC PANEL - Abnormal; Notable for the following:       Result Value   Glucose, Bld 116 (*)    All other components within normal limits  CBC - Abnormal; Notable for the following:    WBC 18.1 (*)    RBC 3.19 (*)    Hemoglobin 10.1 (*)    HCT 32.9 (*)    MCV 103.1 (*)    RDW 16.1 (*)    Platelets 77 (*)    All other components within normal limits  D-DIMER, QUANTITATIVE (NOT AT [facility]) - Abnormal; Notable for the following:    D-Dimer, Quant 1.58 (*)    All other components within normal limits  TROPONIN I - Abnormal; Notable for the following:    Troponin I 1.61 (*)    All other components within normal limits  VALPROIC ACID LEVEL - Abnormal; Notable for the following:    Valproic Acid Lvl 35 (*)    All  other components within normal limits  I-STAT TROPOININ, ED - Abnormal; Notable for the following:    Troponin i, poc 0.41 (*)    All other components within normal limits  TROPONIN I  CBC WITH DIFFERENTIAL/PLATELET  COMPREHENSIVE METABOLIC PANEL  TSH  LIPID PANEL  TROPONIN I    EKG  EKG Interpretation  Date/Time:  Tuesday October 25 2015 16:06:57 EDT Ventricular Rate:  75 PR Interval:    QRS Duration: 103 QT Interval:  388 QTC Calculation: 434 R Axis:   40 Text Interpretation:  Sinus rhythm Borderline short PR interval RSR' in V1 or V2, right VCD or RVH Baseline wander in lead(s) V2 Confirmed by Alvino Chapel  MD, Shakeda Pearse [phone]) on 10/25/2015 4:51:24 PM       Radiology Dg Chest 2 View  Result Date: 10/25/2015 CLINICAL DATA:  LEFT chest pain and LEFT breast pain for a few days. History of metastatic ovarian cancer. EXAM: CHEST  2 VIEW COMPARISON:  CT chest September 29, 2015 FINDINGS: Cardiomediastinal silhouette is normal. A few scattered sub cm pulmonary nodules suspected though, none were present on prior CT chest. No pleural effusion or focal consolidation. No pneumothorax. Surgical clips project in RIGHT chest wall, status post RIGHT mastectomy. Single lumen RIGHT chest Port-A-Cath with distal tip projecting cavoatrial junction. No pneumothorax. Soft tissue planes and included osseous structures are nonsuspicious. IMPRESSION: A few suspected pulmonary nodules though, these were not present on prior CT scan, an could be artifact given interval apparent change. Recommend close attention on follow-up CT chest. Electronically Signed   By: Elon Alas M.D.   On: 10/25/2015 16:28   Ct Angio Chest Pe W And/or Wo Contrast  Result Date: 10/25/2015 CLINICAL DATA:  Chest and left arm discomfort intermittently for the past 3 months, worsening today. History of breast cancer. Last  chemotherapy was 4 days ago. EXAM: CT ANGIOGRAPHY CHEST WITH CONTRAST TECHNIQUE: Multidetector CT imaging of the  chest was performed using the standard protocol during bolus administration of intravenous contrast. Multiplanar CT image reconstructions and MIPs were obtained to evaluate the vascular anatomy. CONTRAST:  100 cc Isovue 370 COMPARISON:  Chest CT from 09/29/2015 FINDINGS: Cardiovascular: No filling defect is identified in the pulmonary arterial tree to suggest pulmonary embolus. Coronary, aortic arch, and branch vessel atherosclerotic vascular disease. No acute aortic findings identified. Mediastinum/Nodes: No pathologic adenopathy identified. Port-A-Cath tip at the cavoatrial junction. Lungs/Pleura: No significant findings. Upper Abdomen: Cirrhosis. The area of the known omental/peritoneal metastatic disease is not included on today' s CT chest. Musculoskeletal: Right mastectomy. Chronic lucencies in the sternal manubrium, not previously hypermetabolic, probably hemangiomas. Review of the MIP images confirms the above findings. IMPRESSION: 1. No embolus or acute vascular findings identified in the chest. 2. Cirrhosis. Electronically Signed   By: Van Clines M.D.   On: 10/25/2015 19:06    Procedures Procedures (including critical care time)  Medications Ordered in ED Medications  levothyroxine (SYNTHROID, LEVOTHROID) tablet 100 mcg (not administered)  divalproex (DEPAKOTE) DR tablet 750 mg (750 mg Oral Given 10/25/15 2229)  pantoprazole (PROTONIX) EC tablet 40 mg (40 mg Oral Not Given 10/25/15 2126)  prochlorperazine (COMPAZINE) tablet 10 mg (not administered)  lidocaine-prilocaine (EMLA) cream (not administered)  fluticasone (FLONASE) 50 MCG/ACT nasal spray 2 spray (2 sprays Each Nare Given 10/25/15 2229)  lamoTRIgine (LAMICTAL) tablet 100 mg (100 mg Oral Given 10/25/15 2229)  ALPRAZolam (XANAX) tablet 0.5-1 mg (0.5 mg Oral Given 10/26/15 0009)  LORazepam (ATIVAN) tablet 1 mg (not administered)  gabapentin (NEURONTIN) capsule 100 mg (100 mg Oral Given 10/25/15 2229)  acetaminophen (TYLENOL) tablet 650  mg (650 mg Oral Given 10/26/15 0011)  sodium chloride flush (NS) 0.9 % injection 3 mL (3 mLs Intravenous Not Given 10/25/15 2200)  ondansetron (ZOFRAN) tablet 4 mg (not administered)    Or  ondansetron (ZOFRAN) injection 4 mg (not administered)  nitroGLYCERIN (NITROSTAT) SL tablet 0.4 mg (not administered)  divalproex (DEPAKOTE) DR tablet 500 mg (not administered)  rOPINIRole (REQUIP) tablet 1.5 mg (1.5 mg Oral Given 10/25/15 2230)  sodium chloride flush (NS) 0.9 % injection 10-40 mL (10 mLs Intracatheter Given 10/25/15 2350)  iopamidol (ISOVUE-370) 76 % injection 100 mL (100 mLs Intravenous Contrast Given 10/25/15 1827)  nitroGLYCERIN (NITROSTAT) SL tablet 0.4 mg (0.4 mg Sublingual Given 10/25/15 1938)     Initial Impression / Assessment and Plan / ED Course  I have reviewed the triage vital signs and the nursing notes.  Pertinent labs & imaging results that were available during my care of the patient were reviewed by me and considered in my medical decision making (see chart for details).  Clinical Course    Patient with chest pain from left chest to left arm. Has been having for weeks has been seen by cardiology. Initial troponin mildly elevated. EKG reassuring. Pain is improved. D-dimer done and was elevated. CT angiography reassuring. Will admit to internal medicine.  Elevated troponin could be due to chemotherapy.  Final Clinical Impressions(s) / ED Diagnoses   Final diagnoses:  Chest pain, unspecified chest pain type  Elevated troponin    New Prescriptions Current Discharge Medication List       Davonna Belling, MD 10/26/15 [phone]

## 2015-10-25 NOTE — Telephone Encounter (Signed)
Call from pt reporting she called EMS re: chest pain and L arm pain. "They ruled out anything cardiac" so she did not go to ED for evaluation. Pt reports arm pain is constant,chest pain is intermittent. Denies dyspnea.  Discussed with Dr. Alen Blew: Pt needs to be seen in the ED to have her heart evaluated. Called pt with this info. She agreed to go to ED for evaluation of chest pain.

## 2015-10-25 NOTE — H&P (Signed)
History and Physical    [name] [id number] DOB: [birth year] DOA: 10/25/2015  PCP: Julie Pepper, MD   Patient coming from: Home.  Chief Complaint: Chest pain.  HPI: [name] is a 60 y.o. female with medical history significant of ADD, seasonal allergies, anxiety, bipolar disorder, breast cancer on maintenance chemotherapy, depression, hyperlipidemia, hypothyroidism, mild sleep apnea not on CPAP, osteopenia who came to the emergency department with complaints of chest pain.  Per patient, she has been having on and off chest pain since May this year. She was evaluated by Dr. Dorris Padilla (cardiology), but not sure about the testing that was done. Today in the afternoon, patient had a recurrence of the pain and decided to come to the emergency department. She is states that the pain is dull and aching, associated with occasional diaphoresis, but denies dyspnea, palpitations, nausea, emesis, PND, orthopnea or pitting edema of the lower extremities. She states that it improves when she is lying down.  ED Course: The patient received sublingual nitroglycerin and is currently chest pain-free. EKG showed borderline T-wave abnormalities and PVC. Troponin level was 0.41 pg/mL.  Review of Systems: As per HPI otherwise 10 point review of systems negative.     Past Medical History:  Diagnosis Date  . ADD (attention deficit disorder)   . Allergy    seasonal  . Anxiety   . Bipolar disorder ([place])   . Breast cancer ([place]) 1989   right breast  . Depression   . Diabetes mellitus    pt denies DM noe meds  . Hematochezia   . Hyperlipidemia   . Hypothyroidism   . Maintenance chemotherapy    Pt has chemo every 3 weeks (on Friday)  . Osteopenia 03/2009   t score -2.1 FRAX 4.6/0.4  . Premature menopause   . Sleep apnea    mild    Past Surgical History:  Procedure Laterality Date  . BREAST SURGERY  1989   RIGHT LUMPECTOMY, RADIATION AND CHEMO  . FOOT SURGERY  2013   BILATERAL     . HYSTEROSCOPY  2011   Polyp  . PELVIC LAPAROSCOPY/ Hysteroscopy  1996     reports that she has never smoked. She has never used smokeless tobacco. She reports that she drinks alcohol. She reports that she does not use drugs.  Allergies  Allergen Reactions  . Doxil [Doxorubicin Hcl Liposomal] Anaphylaxis    1st Doxil.   . Pollen Extract Other (See Comments)    Pollen and grass causes a lot sneezing    Family History  Problem Relation Age of Onset  . Breast cancer Maternal Aunt 29  . Diabetes Maternal Grandmother   . Heart disease Maternal Grandmother   . Heart disease Maternal Grandfather   . Hypertension Paternal Grandfather   . Heart Problems Paternal Grandfather   . Breast cancer Maternal Aunt     dx. early 72s; had negative GT approx 10 years ago  . Breast cancer Maternal Aunt     dx. 1s with recurrence  . Ovarian cancer Neg Hx   . Colon cancer Neg Hx   . Allergies Father   . Heart disease Mother   . Other Mother     hx of hysterectomy   . Liver cancer Cousin 60    +EtOH  . Cancer Cousin     paternal 1st cousin, once-removed dx. NOS cancer (maybe ovarian) in late 30s-40s  . Cancer Cousin     female paternal 2nd cousin d. of  NOS cancer in her 20s-early 30s    Prior to Admission medications   Medication Sig Start Date End Date Taking? Authorizing Provider  Acetylcarnitine HCl (ACETYL L-CARNITINE PO) Take 2,000 mg by mouth at bedtime.   Yes Historical Provider, MD  ALPRAZolam Duanne Moron) 0.5 MG tablet Take 1 tablet (0.5 mg total) by mouth at bedtime as needed for anxiety (1-2 tabs @@ hs and 1 tab day of chemotherapy treatments). Patient taking differently: Take 0.5-1 mg by mouth 2 (two) times daily.  10/21/15  Yes Wyatt Portela, MD  divalproex (DEPAKOTE) 250 MG DR tablet Take 500-750 mg by mouth 2 (two) times daily. Take 500mg s in the morning and 750mg s at night   Yes Historical Provider, MD  fluticasone (FLONASE) 50 MCG/ACT nasal spray INHALE 2 SPRAYS INTO EACH NOSTRIL  EVERY NIGHT 08/25/15  Yes Historical Provider, MD  gabapentin (NEURONTIN) 100 MG capsule Take 1 capsule (100 mg total) by mouth 3 (three) times daily. 10/21/15  Yes Wyatt Portela, MD  lamoTRIgine (LAMICTAL) 100 MG tablet Take 100 mg by mouth 2 (two) times daily.   Yes Historical Provider, MD  levothyroxine (SYNTHROID, LEVOTHROID) 100 MCG tablet Take 100 mcg by mouth daily before breakfast.    Yes Historical Provider, MD  lidocaine-prilocaine (EMLA) cream Apply topically as needed. Apply to port with every chemotherapy. 08/19/15  Yes Owens Shark, NP  loperamide (IMODIUM A-D) 2 MG tablet Take 2 mg by mouth as needed for diarrhea or loose stools.   Yes Historical Provider, MD  omeprazole (PRILOSEC) 40 MG capsule Take 40 mg by mouth. 06/24/15  Yes Historical Provider, MD  ondansetron (ZOFRAN) 8 MG tablet TAKE 1 TABLET BY MOUTH EVERY 8 HOURS AS NEEDED FOR NAUSEA AND VOMITING 05/18/15  Yes Wyatt Portela, MD  prochlorperazine (COMPAZINE) 10 MG tablet Take 1 tablet (10 mg total) by mouth every 6 (six) hours as needed for nausea or vomiting. 07/08/15  Yes Wyatt Portela, MD  rOPINIRole (REQUIP) 0.5 MG tablet Take 0.5 mg by mouth at bedtime. Take 1 to 3 hours before bedtime   Yes Historical Provider, MD  rOPINIRole (REQUIP) 1 MG tablet Take 1 mg by mouth at bedtime.    Yes Historical Provider, MD  LORazepam (ATIVAN) 1 MG tablet Take 1 tablet (1 mg total) by mouth every 6 (six) hours as needed for anxiety. 10/21/15   Wyatt Portela, MD    Physical Exam: Vitals:   10/25/15 1700 10/25/15 1815 10/25/15 1904 10/25/15 2112  BP: 132/87   111/81  Pulse: 79 80 79 82  Resp: 15 17 16 18   Temp:    98.9 F (37.2 C)  TempSrc:    Oral  SpO2: 95% 93% 95% 95%  Weight:      Height:          Constitutional: NAD, calm, comfortable Vitals:   10/25/15 1700 10/25/15 1815 10/25/15 1904 10/25/15 2112  BP: 132/87   111/81  Pulse: 79 80 79 82  Resp: 15 17 16 18   Temp:    98.9 F (37.2 C)  TempSrc:    Oral  SpO2: 95% 93%  95% 95%  Weight:      Height:       Eyes: PERRL, lids and conjunctivae normal ENMT: Mucous membranes are moist. Posterior pharynx clear of any exudate or lesions.  Neck: normal, supple, no masses, no thyromegaly Respiratory: Clear to auscultation bilaterally, no wheezing, no crackles. Normal respiratory effort. No accessory muscle use.  Chest: Reproducible tenderness in  the left upper chest wall and medial aspect of left arm. Right-sided Port-A-Cath. Cardiovascular: Regular rate and rhythm, no murmurs / rubs / gallops. No extremity edema. 2+ pedal pulses. No carotid bruits.  Abdomen: Soft, no tenderness, no masses palpated. No hepatosplenomegaly. Bowel sounds positive.  Musculoskeletal: no clubbing / cyanosis. No joint deformity upper and lower extremities. Good ROM, no contractures. Normal muscle tone.  Skin: no rashes, lesions, ulcers. No induration Neurologic: CN 2-12 grossly intact. Sensation intact, DTR normal. Strength 5/5 in all 4.  Psychiatric: Normal judgment and insight. Alert and oriented x 4. Normal mood.     Labs on Admission: I have personally reviewed following labs and imaging studies  CBC:  Recent Labs Lab 10/21/15 1047 10/25/15 1649  WBC 2.4* 18.1*  NEUTROABS 1.1*  --   HGB 9.8* 10.1*  HCT 31.5* 32.9*  MCV 99.9 103.1*  PLT 83* 77*   Basic Metabolic Panel:  Recent Labs Lab 10/21/15 1047 10/25/15 1649  NA 141 139  K 4.4 4.3  CL  --  103  CO2 26 30  GLUCOSE 85 116*  BUN 11.6 14  CREATININE 0.7 0.59  CALCIUM 9.3 9.1   GFR: Estimated Creatinine Clearance: 72.6 mL/min (by C-G formula based on SCr of 0.8 mg/dL). Liver Function Tests:  Recent Labs Lab 10/21/15 1047  AST 27  ALT 11  ALKPHOS 77  BILITOT <0.30  PROT 8.0  ALBUMIN 3.4*   Urine analysis:    Component Value Date/Time   COLORURINE YELLOW 01/11/2012 1014   APPEARANCEUR CLEAR 01/11/2012 1014   LABSPEC 1.010 07/15/2015 0944   PHURINE 6.0 07/15/2015 0944   PHURINE 7.5 01/11/2012 1014    GLUCOSEU Negative 07/15/2015 0944   HGBUR Negative 07/15/2015 0944   HGBUR NEG 01/11/2012 1014   BILIRUBINUR Negative 07/15/2015 0944   KETONESUR Negative 07/15/2015 0944   KETONESUR NEG 01/11/2012 1014   PROTEINUR Negative 07/15/2015 0944   PROTEINUR Negative 03/25/2015 1146   UROBILINOGEN 0.2 07/15/2015 0944   NITRITE Negative 07/15/2015 0944   NITRITE NEG 01/11/2012 1014   LEUKOCYTESUR Negative 07/15/2015 0944    Radiological Exams on Admission: Dg Chest 2 View  Result Date: 10/25/2015 CLINICAL DATA:  LEFT chest pain and LEFT breast pain for a few days. History of metastatic ovarian cancer. EXAM: CHEST  2 VIEW COMPARISON:  CT chest September 29, 2015 FINDINGS: Cardiomediastinal silhouette is normal. A few scattered sub cm pulmonary nodules suspected though, none were present on prior CT chest. No pleural effusion or focal consolidation. No pneumothorax. Surgical clips project in RIGHT chest wall, status post RIGHT mastectomy. Single lumen RIGHT chest Port-A-Cath with distal tip projecting cavoatrial junction. No pneumothorax. Soft tissue planes and included osseous structures are nonsuspicious. IMPRESSION: A few suspected pulmonary nodules though, these were not present on prior CT scan, an could be artifact given interval apparent change. Recommend close attention on follow-up CT chest. Electronically Signed   By: Elon Alas M.D.   On: 10/25/2015 16:28   Ct Angio Chest Pe W And/or Wo Contrast  Result Date: 10/25/2015 CLINICAL DATA:  Chest and left arm discomfort intermittently for the past 3 months, worsening today. History of breast cancer. Last chemotherapy was 4 days ago. EXAM: CT ANGIOGRAPHY CHEST WITH CONTRAST TECHNIQUE: Multidetector CT imaging of the chest was performed using the standard protocol during bolus administration of intravenous contrast. Multiplanar CT image reconstructions and MIPs were obtained to evaluate the vascular anatomy. CONTRAST:  100 cc Isovue 370  COMPARISON:  Chest CT from 09/29/2015  FINDINGS: Cardiovascular: No filling defect is identified in the pulmonary arterial tree to suggest pulmonary embolus. Coronary, aortic arch, and branch vessel atherosclerotic vascular disease. No acute aortic findings identified. Mediastinum/Nodes: No pathologic adenopathy identified. Port-A-Cath tip at the cavoatrial junction. Lungs/Pleura: No significant findings. Upper Abdomen: Cirrhosis. The area of the known omental/peritoneal metastatic disease is not included on today' s CT chest. Musculoskeletal: Right mastectomy. Chronic lucencies in the sternal manubrium, not previously hypermetabolic, probably hemangiomas. Review of the MIP images confirms the above findings. IMPRESSION: 1. No embolus or acute vascular findings identified in the chest. 2. Cirrhosis. Electronically Signed   By: Van Clines M.D.   On: 10/25/2015 19:06    EKG: Independently reviewed. Vent. rate 81 BPM PR interval * ms QRS duration 105 ms QT/QTc 401/466 ms P-R-T axes 31 48 58 Sinus rhythm Ventricular premature complex Borderline short PR interval Borderline T wave abnormalities  Assessment/Plan Principal Problem:   Chest pain   Elevated troponin Pain is atypical, but the patient has an elevated troponin level. Admit to telemetry/observation. Continue supplemental oxygen. Continue nitroglycerin as needed. Continue analgesics as needed. Trend troponin levels. Consider cardiology consult if trending up. Follow-up EKG in the morning. Get echocardiogram in the morning.  Active Problems:   Hypothyroidism Continue levothyroxine 100 g by mouth daily. Check TSH level periodically.    Hyperlipidemia Continue lifestyle modifications. Check fasting lipids in the morning.    Obstructive sleep apnea Not on CPAP. Supplemental oxygen at bedtime.    Anxiety Continue alprazolam. Continue lorazepam as needed.    Bipolar disorder ([place]) Stable at this time. Continue  Lamictal 100 mg by mouth twice a day. Depakote 500 mg daily in the morning. Depakote 750 mg by mouth at bedtime. Check valproic acid level.    Malignant neoplasm of female breast [facility]) Continue chemotherapy therapy per oncology.    Pancytopenia ([place]) The patient is usually pancytopenic, but received Neulasta last week. Monitor CBC periodically.    Leukocytosis The patient received Neulasta last week after chemotherapy. She is afebrile. Follow-up WBC in the morning.       DVT prophylaxis: SCDs.. Code Status: Full code. Family Communication: Her husband, Taraann Bogusz, was present in the ED room. Disposition Plan: Admit for cardiac monitoring, troponin levels trending, echo. Consults called:  Admission status: Observation/telemetry.   Reubin Milan MD [facility] Pager [phone].  If 7PM-7AM, please contact night-coverage [email or web address] Password [email or web address]  10/25/2015, 9:30 PM

## 2015-10-25 NOTE — ED Notes (Signed)
RN at bedside accessing port and will collect blood samples.

## 2015-10-26 ENCOUNTER — Other Ambulatory Visit: Payer: Self-pay

## 2015-10-26 ENCOUNTER — Inpatient Hospital Stay (HOSPITAL_COMMUNITY)
Admission: EM | Disposition: A | Discharge status: Home / Self Care | Payer: Self-pay | Source: Home / Self Care | Attending: Cardiovascular Disease

## 2015-10-26 DIAGNOSIS — E785 Hyperlipidemia, unspecified: Secondary | ICD-10-CM | POA: Diagnosis not present

## 2015-10-26 DIAGNOSIS — I214 Non-ST elevation (NSTEMI) myocardial infarction: Secondary | ICD-10-CM

## 2015-10-26 DIAGNOSIS — E038 Other specified hypothyroidism: Secondary | ICD-10-CM | POA: Diagnosis not present

## 2015-10-26 DIAGNOSIS — M8580 Other specified disorders of bone density and structure, unspecified site: Secondary | ICD-10-CM | POA: Diagnosis not present

## 2015-10-26 DIAGNOSIS — F329 Major depressive disorder, single episode, unspecified: Secondary | ICD-10-CM | POA: Diagnosis not present

## 2015-10-26 DIAGNOSIS — D72829 Elevated white blood cell count, unspecified: Secondary | ICD-10-CM | POA: Diagnosis not present

## 2015-10-26 DIAGNOSIS — G4733 Obstructive sleep apnea (adult) (pediatric): Secondary | ICD-10-CM | POA: Diagnosis not present

## 2015-10-26 DIAGNOSIS — D61810 Antineoplastic chemotherapy induced pancytopenia: Secondary | ICD-10-CM | POA: Diagnosis not present

## 2015-10-26 DIAGNOSIS — R7989 Other specified abnormal findings of blood chemistry: Secondary | ICD-10-CM

## 2015-10-26 DIAGNOSIS — R079 Chest pain, unspecified: Secondary | ICD-10-CM | POA: Diagnosis not present

## 2015-10-26 DIAGNOSIS — D61818 Other pancytopenia: Secondary | ICD-10-CM

## 2015-10-26 DIAGNOSIS — I209 Angina pectoris, unspecified: Secondary | ICD-10-CM | POA: Diagnosis not present

## 2015-10-26 DIAGNOSIS — R0789 Other chest pain: Secondary | ICD-10-CM

## 2015-10-26 DIAGNOSIS — Z79899 Other long term (current) drug therapy: Secondary | ICD-10-CM | POA: Diagnosis not present

## 2015-10-26 DIAGNOSIS — Z7951 Long term (current) use of inhaled steroids: Secondary | ICD-10-CM | POA: Diagnosis not present

## 2015-10-26 DIAGNOSIS — T458X5A Adverse effect of other primarily systemic and hematological agents, initial encounter: Secondary | ICD-10-CM | POA: Diagnosis not present

## 2015-10-26 DIAGNOSIS — E039 Hypothyroidism, unspecified: Secondary | ICD-10-CM | POA: Diagnosis not present

## 2015-10-26 DIAGNOSIS — I2510 Atherosclerotic heart disease of native coronary artery without angina pectoris: Secondary | ICD-10-CM | POA: Diagnosis not present

## 2015-10-26 DIAGNOSIS — C799 Secondary malignant neoplasm of unspecified site: Secondary | ICD-10-CM

## 2015-10-26 DIAGNOSIS — F319 Bipolar disorder, unspecified: Secondary | ICD-10-CM | POA: Diagnosis not present

## 2015-10-26 DIAGNOSIS — C786 Secondary malignant neoplasm of retroperitoneum and peritoneum: Secondary | ICD-10-CM | POA: Diagnosis not present

## 2015-10-26 DIAGNOSIS — Z923 Personal history of irradiation: Secondary | ICD-10-CM | POA: Diagnosis not present

## 2015-10-26 DIAGNOSIS — F419 Anxiety disorder, unspecified: Secondary | ICD-10-CM | POA: Diagnosis not present

## 2015-10-26 DIAGNOSIS — C50911 Malignant neoplasm of unspecified site of right female breast: Secondary | ICD-10-CM | POA: Diagnosis not present

## 2015-10-26 DIAGNOSIS — C50919 Malignant neoplasm of unspecified site of unspecified female breast: Secondary | ICD-10-CM

## 2015-10-26 HISTORY — PX: CARDIAC CATHETERIZATION: SHX172

## 2015-10-26 HISTORY — DX: Non-ST elevation (NSTEMI) myocardial infarction: I21.4

## 2015-10-26 HISTORY — PX: OTHER SURGICAL HISTORY: SHX169

## 2015-10-26 LAB — COMPREHENSIVE METABOLIC PANEL
ALK PHOS: 83 U/L (ref 38–126)
ALT: 15 U/L (ref 14–54)
AST: 28 U/L (ref 15–41)
Albumin: 3.3 g/dL — ABNORMAL LOW (ref 3.5–5.0)
Anion gap: 5 (ref 5–15)
BILIRUBIN TOTAL: 0.4 mg/dL (ref 0.3–1.2)
BUN: 14 mg/dL (ref 6–20)
CO2: 31 mmol/L (ref 22–32)
CREATININE: 0.58 mg/dL (ref 0.44–1.00)
Calcium: 9.3 mg/dL (ref 8.9–10.3)
Chloride: 102 mmol/L (ref 101–111)
GFR calc Af Amer: >60 mL/min (ref >60)
GLUCOSE: 106 mg/dL — AB (ref 65–99)
Potassium: 4 mmol/L (ref 3.5–5.1)
Sodium: 138 mmol/L (ref 135–145)
TOTAL PROTEIN: 7.4 g/dL (ref 6.5–8.1)

## 2015-10-26 LAB — CBC WITH DIFFERENTIAL/PLATELET
Basophils Absolute: 0 10*3/uL (ref 0.0–0.1)
Basophils Relative: 0 %
EOS PCT: 0 %
Eosinophils Absolute: 0 10*3/uL (ref 0.0–0.7)
HEMATOCRIT: 31.2 % — AB (ref 36.0–46.0)
HEMOGLOBIN: 9.5 g/dL — AB (ref 12.0–15.0)
LYMPHS ABS: 1.6 10*3/uL (ref 0.7–4.0)
LYMPHS PCT: 12 %
MCH: 31.3 pg (ref 26.0–34.0)
MCHC: 30.4 g/dL (ref 30.0–36.0)
MCV: 102.6 fL — AB (ref 78.0–100.0)
MONOS PCT: 8 %
Monocytes Absolute: 1 10*3/uL (ref 0.1–1.0)
NEUTROS PCT: 80 %
Neutro Abs: 10.4 10*3/uL — ABNORMAL HIGH (ref 1.7–7.7)
Platelets: 74 10*3/uL — ABNORMAL LOW (ref 150–400)
RBC: 3.04 MIL/uL — AB (ref 3.87–5.11)
RDW: 16.2 % — ABNORMAL HIGH (ref 11.5–15.5)
WBC: 13 10*3/uL — AB (ref 4.0–10.5)

## 2015-10-26 LAB — LIPID PANEL
CHOL/HDL RATIO: 4.2 ratio
CHOLESTEROL: 161 mg/dL (ref 0–200)
HDL: 38 mg/dL — AB (ref >40)
LDL CALC: 91 mg/dL (ref 0–99)
TRIGLYCERIDES: 162 mg/dL — AB (ref <150)
VLDL: 32 mg/dL (ref 0–40)

## 2015-10-26 LAB — TROPONIN I
Troponin I: 1.61 ng/mL (ref <0.03)
Troponin I: 1.1 ng/mL (ref <0.03)

## 2015-10-26 LAB — VALPROIC ACID LEVEL: Valproic Acid Lvl: 35 ug/mL — ABNORMAL LOW (ref 50.0–100.0)

## 2015-10-26 LAB — PROTIME-INR
INR: 1.11
Prothrombin Time: 14.4 seconds (ref 11.4–15.2)

## 2015-10-26 LAB — POCT ACTIVATED CLOTTING TIME: ACTIVATED CLOTTING TIME: 224 s

## 2015-10-26 LAB — GLUCOSE, CAPILLARY: Glucose-Capillary: 86 mg/dL (ref 65–99)

## 2015-10-26 LAB — APTT: aPTT: 29 seconds (ref 24–36)

## 2015-10-26 LAB — TSH: TSH: 5.305 u[IU]/mL — ABNORMAL HIGH (ref 0.350–4.500)

## 2015-10-26 SURGERY — LEFT HEART CATH AND CORONARY ANGIOGRAPHY
Anesthesia: LOCAL

## 2015-10-26 MED ORDER — CARVEDILOL 3.125 MG PO TABS: 3.1250 mg | ORAL_TABLET | Freq: Two times a day (BID) | ORAL | Status: DC

## 2015-10-26 MED ORDER — FENTANYL CITRATE (PF) 100 MCG/2ML IJ SOLN
INTRAMUSCULAR | Status: AC
  Filled 2015-10-26: qty 2

## 2015-10-26 MED ORDER — VERAPAMIL HCL 2.5 MG/ML IV SOLN
INTRAVENOUS | Status: AC
  Filled 2015-10-26: qty 2

## 2015-10-26 MED ORDER — NITROGLYCERIN IN D5W 200-5 MCG/ML-% IV SOLN
0.0000 ug/min | INTRAVENOUS | Status: DC
  Administered 2015-10-26: 5 ug/min via INTRAVENOUS
  Filled 2015-10-26: qty 250

## 2015-10-26 MED ORDER — FENTANYL CITRATE (PF) 100 MCG/2ML IJ SOLN
INTRAMUSCULAR | Status: DC | PRN
  Administered 2015-10-26: 50 ug via INTRAVENOUS

## 2015-10-26 MED ORDER — SODIUM CHLORIDE 0.9% FLUSH: 3.0000 mL | INTRAVENOUS | Status: DC | PRN

## 2015-10-26 MED ORDER — NITROGLYCERIN IN D5W 200-5 MCG/ML-% IV SOLN
0.0000 ug/min | INTRAVENOUS | Status: DC
  Administered 2015-10-26: 5 ug/min via INTRAVENOUS

## 2015-10-26 MED ORDER — SODIUM CHLORIDE 0.9% FLUSH: 10.0000 mL | INTRAVENOUS | Status: DC | PRN

## 2015-10-26 MED ORDER — IOHEXOL 350 MG/ML SOLN
INTRAVENOUS | Status: DC | PRN
  Administered 2015-10-26: 160 mL via INTRA_ARTERIAL

## 2015-10-26 MED ORDER — HEPARIN (PORCINE) IN NACL 2-0.9 UNIT/ML-% IJ SOLN
INTRAMUSCULAR | Status: DC | PRN
  Administered 2015-10-26: 1000 mL

## 2015-10-26 MED ORDER — CLOPIDOGREL BISULFATE 300 MG PO TABS
ORAL_TABLET | ORAL | Status: AC
  Filled 2015-10-26: qty 1

## 2015-10-26 MED ORDER — SODIUM CHLORIDE 0.9 % IV SOLN: 250.0000 mL | INTRAVENOUS | Status: DC | PRN

## 2015-10-26 MED ORDER — HEPARIN (PORCINE) IN NACL 100-0.45 UNIT/ML-% IJ SOLN
900.0000 [IU]/h | INTRAMUSCULAR | Status: DC
  Administered 2015-10-26: 900 [IU]/h via INTRAVENOUS
  Filled 2015-10-26: qty 250

## 2015-10-26 MED ORDER — CLOPIDOGREL BISULFATE 300 MG PO TABS
ORAL_TABLET | ORAL | Status: DC | PRN
  Administered 2015-10-26: 600 mg via ORAL

## 2015-10-26 MED ORDER — NITROGLYCERIN 1 MG/10 ML FOR IR/CATH LAB
INTRA_ARTERIAL | Status: AC
  Filled 2015-10-26: qty 10

## 2015-10-26 MED ORDER — VERAPAMIL HCL 2.5 MG/ML IV SOLN
INTRAVENOUS | Status: DC | PRN
  Administered 2015-10-26: 10 mL via INTRA_ARTERIAL

## 2015-10-26 MED ORDER — ASPIRIN 81 MG PO CHEW: 81.0000 mg | CHEWABLE_TABLET | ORAL | Status: DC

## 2015-10-26 MED ORDER — MIDAZOLAM HCL 2 MG/2ML IJ SOLN
INTRAMUSCULAR | Status: DC | PRN
  Administered 2015-10-26: 1 mg via INTRAVENOUS

## 2015-10-26 MED ORDER — SODIUM CHLORIDE 0.9 % IV SOLN
INTRAVENOUS | Status: AC
  Administered 2015-10-26: 16:00:00 via INTRAVENOUS

## 2015-10-26 MED ORDER — ATORVASTATIN CALCIUM 40 MG PO TABS
40.0000 mg | ORAL_TABLET | Freq: Every day | ORAL | Status: DC
  Administered 2015-10-26: 40 mg via ORAL
  Filled 2015-10-26: qty 1

## 2015-10-26 MED ORDER — ASPIRIN 325 MG PO TABS
325.0000 mg | ORAL_TABLET | Freq: Once | ORAL | Status: AC
  Administered 2015-10-26: 325 mg via ORAL
  Filled 2015-10-26: qty 1

## 2015-10-26 MED ORDER — MIDAZOLAM HCL 2 MG/2ML IJ SOLN
INTRAMUSCULAR | Status: AC
  Filled 2015-10-26: qty 2

## 2015-10-26 MED ORDER — SODIUM CHLORIDE 0.9% FLUSH: 3.0000 mL | Freq: Two times a day (BID) | INTRAVENOUS | Status: DC

## 2015-10-26 MED ORDER — HEPARIN BOLUS VIA INFUSION
3000.0000 [IU] | Freq: Once | INTRAVENOUS | Status: AC
  Administered 2015-10-26: 3000 [IU] via INTRAVENOUS
  Filled 2015-10-26: qty 3000

## 2015-10-26 MED ORDER — FAMOTIDINE IN NACL 20-0.9 MG/50ML-% IV SOLN
INTRAVENOUS | Status: AC
  Filled 2015-10-26: qty 50

## 2015-10-26 MED ORDER — CARVEDILOL 3.125 MG PO TABS
3.1250 mg | ORAL_TABLET | Freq: Two times a day (BID) | ORAL | Status: DC
  Administered 2015-10-26 – 2015-10-27 (×2): 3.125 mg via ORAL
  Filled 2015-10-26 (×2): qty 1

## 2015-10-26 MED ORDER — LIDOCAINE HCL (PF) 1 % IJ SOLN
INTRAMUSCULAR | Status: AC
  Filled 2015-10-26: qty 30

## 2015-10-26 MED ORDER — SODIUM CHLORIDE 0.9 % IV SOLN
INTRAVENOUS | Status: DC
  Administered 2015-10-26: 12:00:00 via INTRAVENOUS

## 2015-10-26 MED ORDER — LISINOPRIL 5 MG PO TABS
2.5000 mg | ORAL_TABLET | Freq: Every day | ORAL | Status: DC
  Administered 2015-10-27: 2.5 mg via ORAL
  Filled 2015-10-26: qty 1

## 2015-10-26 MED ORDER — NITROGLYCERIN 1 MG/10 ML FOR IR/CATH LAB
INTRA_ARTERIAL | Status: DC | PRN
  Administered 2015-10-26 (×2): 200 ug via INTRACORONARY

## 2015-10-26 MED ORDER — HEPARIN SODIUM (PORCINE) 1000 UNIT/ML IJ SOLN
INTRAMUSCULAR | Status: AC
  Filled 2015-10-26: qty 1

## 2015-10-26 MED ORDER — HEPARIN SODIUM (PORCINE) 1000 UNIT/ML IJ SOLN
INTRAMUSCULAR | Status: DC | PRN
Start: 2015-10-26 — End: 2015-10-26
  Administered 2015-10-26: 2000 [IU] via INTRAVENOUS
  Administered 2015-10-26: 5000 [IU] via INTRAVENOUS

## 2015-10-26 MED ORDER — CLOPIDOGREL BISULFATE 75 MG PO TABS
75.0000 mg | ORAL_TABLET | Freq: Every day | ORAL | Status: DC
  Administered 2015-10-27: 11:00:00 75 mg via ORAL
  Filled 2015-10-26: qty 1

## 2015-10-26 MED ORDER — ASPIRIN 81 MG PO CHEW
81.0000 mg | CHEWABLE_TABLET | Freq: Every day | ORAL | Status: DC
  Administered 2015-10-27: 11:00:00 81 mg via ORAL
  Filled 2015-10-26: qty 1

## 2015-10-26 MED ORDER — LIDOCAINE HCL (PF) 1 % IJ SOLN
INTRAMUSCULAR | Status: DC | PRN
  Administered 2015-10-26: 2 mL via SUBCUTANEOUS

## 2015-10-26 MED ORDER — HEPARIN (PORCINE) IN NACL 2-0.9 UNIT/ML-% IJ SOLN
INTRAMUSCULAR | Status: AC
  Filled 2015-10-26: qty 1000

## 2015-10-26 SURGICAL SUPPLY — 16 items
BALLN EMERGE MR 2.0X12 (BALLOONS) ×2
BALLOON EMERGE MR 2.0X12 (BALLOONS) ×1 IMPLANT
CATH INFINITI 5 FR JL3.5 (CATHETERS) ×2 IMPLANT
CATH OPTITORQUE JACKY 4.0 5F (CATHETERS) ×2 IMPLANT
CATH VISTA GUIDE 6FR XBLAD3.5 (CATHETERS) ×2 IMPLANT
GLIDESHEATH SLEND SS 6F .021 (SHEATH) ×2 IMPLANT
KIT ENCORE 26 ADVANTAGE (KITS) ×2 IMPLANT
KIT HEART LEFT (KITS) ×2 IMPLANT
PACK CARDIAC CATHETERIZATION (CUSTOM PROCEDURE TRAY) ×2 IMPLANT
STENT XIENCE ALPINE RX 2.25X15 (Permanent Stent) ×2 IMPLANT
STOPCOCK MORSE 400PSI 3WAY (MISCELLANEOUS) ×2 IMPLANT
SYR MEDRAD MARK V 150ML (SYRINGE) ×2 IMPLANT
TRANSDUCER W/STOPCOCK (MISCELLANEOUS) ×2 IMPLANT
TUBING CIL FLEX 10 FLL-RA (TUBING) ×4 IMPLANT
WIRE EMERALD 3MM-J .035X260CM (WIRE) ×2 IMPLANT
WIRE RUNTHROUGH .014X180CM (WIRE) ×2 IMPLANT

## 2015-10-26 NOTE — Progress Notes (Signed)
Subjective: No chest or arm pain at present  Objective: Vitals:   10/26/15 0253 10/26/15 0256 10/26/15 0303 10/26/15 0440  BP: 133/82 128/87 126/80 106/63  Pulse:    80  Resp:    17  Temp:    98.2 F (36.8 C)  TempSrc:    Oral  SpO2:   95% 96%  Weight:    166 lb (75.3 kg)  Height:    5\' 2"  (1.575 m)   Weight change:   Intake/Output Summary (Last 24 hours) at 10/26/15 1016 Last data filed at 10/26/15 0500  Gross per 24 hour  Intake           266.08 ml  Output                0 ml  Net           266.08 ml    General: Alert, awake, oriented x3, in no acute distress Neck:  JVP is normal Heart: Regular rate and rhythm, without murmurs, rubs, gallops.  Lungs: Clear to auscultation.  No rales or wheezes. Exemities:  No edema.   Neuro: Grossly intact, nonfocal.  Tele  SR    Lab Results: Results for orders placed or performed during the hospital encounter of 10/25/15 (from the past 24 hour(s))  Basic metabolic panel     Status: Abnormal   Collection Time: 10/25/15  4:49 PM  Result Value Ref Range   Sodium 139 135 - 145 mmol/L   Potassium 4.3 3.5 - 5.1 mmol/L   Chloride 103 101 - 111 mmol/L   CO2 30 22 - 32 mmol/L   Glucose, Bld 116 (H) 65 - 99 mg/dL   BUN 14 6 - 20 mg/dL   Creatinine, Ser 0.59 0.44 - 1.00 mg/dL   Calcium 9.1 8.9 - 10.3 mg/dL   GFR calc non Af Amer >60 >60 mL/min   GFR calc Af Amer >60 >60 mL/min   Anion gap 6 5 - 15  CBC     Status: Abnormal   Collection Time: 10/25/15  4:49 PM  Result Value Ref Range   WBC 18.1 (H) 4.0 - 10.5 K/uL   RBC 3.19 (L) 3.87 - 5.11 MIL/uL   Hemoglobin 10.1 (L) 12.0 - 15.0 g/dL   HCT 32.9 (L) 36.0 - 46.0 %   MCV 103.1 (H) 78.0 - 100.0 fL   MCH 31.7 26.0 - 34.0 pg   MCHC 30.7 30.0 - 36.0 g/dL   RDW 16.1 (H) 11.5 - 15.5 %   Platelets 77 (L) 150 - 400 K/uL  D-dimer, quantitative (not at [facility])     Status: Abnormal   Collection Time: 10/25/15  4:49 PM  Result Value Ref Range   D-Dimer, Quant 1.58 (H) 0.00 - 0.50  ug/mL-FEU  I-stat troponin, ED     Status: Abnormal   Collection Time: 10/25/15  4:53 PM  Result Value Ref Range   Troponin i, poc 0.41 (HH) 0.00 - 0.08 ng/mL   Comment NOTIFIED PHYSICIAN    Comment 3          Troponin I     Status: Abnormal   Collection Time: 10/25/15 11:00 PM  Result Value Ref Range   Troponin I 1.61 (HH) <0.03 ng/mL  Valproic acid level     Status: Abnormal   Collection Time: 10/25/15 11:00 PM  Result Value Ref Range   Valproic Acid Lvl 35 (L) 50.0 - 100.0 ug/mL  Troponin I     Status: Abnormal  Collection Time: 10/26/15  3:40 AM  Result Value Ref Range   Troponin I 1.10 (HH) <0.03 ng/mL  CBC WITH DIFFERENTIAL     Status: Abnormal   Collection Time: 10/26/15  3:40 AM  Result Value Ref Range   WBC 13.0 (H) 4.0 - 10.5 K/uL   RBC 3.04 (L) 3.87 - 5.11 MIL/uL   Hemoglobin 9.5 (L) 12.0 - 15.0 g/dL   HCT 31.2 (L) 36.0 - 46.0 %   MCV 102.6 (H) 78.0 - 100.0 fL   MCH 31.3 26.0 - 34.0 pg   MCHC 30.4 30.0 - 36.0 g/dL   RDW 16.2 (H) 11.5 - 15.5 %   Platelets 74 (L) 150 - 400 K/uL   Neutrophils Relative % 80 %   Lymphocytes Relative 12 %   Monocytes Relative 8 %   Eosinophils Relative 0 %   Basophils Relative 0 %   Neutro Abs 10.4 (H) 1.7 - 7.7 K/uL   Lymphs Abs 1.6 0.7 - 4.0 K/uL   Monocytes Absolute 1.0 0.1 - 1.0 K/uL   Eosinophils Absolute 0.0 0.0 - 0.7 K/uL   Basophils Absolute 0.0 0.0 - 0.1 K/uL   Smear Review MORPHOLOGY UNREMARKABLE   Comprehensive metabolic panel     Status: Abnormal   Collection Time: 10/26/15  3:40 AM  Result Value Ref Range   Sodium 138 135 - 145 mmol/L   Potassium 4.0 3.5 - 5.1 mmol/L   Chloride 102 101 - 111 mmol/L   CO2 31 22 - 32 mmol/L   Glucose, Bld 106 (H) 65 - 99 mg/dL   BUN 14 6 - 20 mg/dL   Creatinine, Ser 0.58 0.44 - 1.00 mg/dL   Calcium 9.3 8.9 - 10.3 mg/dL   Total Protein 7.4 6.5 - 8.1 g/dL   Albumin 3.3 (L) 3.5 - 5.0 g/dL   AST 28 15 - 41 U/L   ALT 15 14 - 54 U/L   Alkaline Phosphatase 83 38 - 126 U/L   Total  Bilirubin 0.4 0.3 - 1.2 mg/dL   GFR calc non Af Amer >60 >60 mL/min   GFR calc Af Amer >60 >60 mL/min   Anion gap 5 5 - 15  TSH     Status: Abnormal   Collection Time: 10/26/15  3:40 AM  Result Value Ref Range   TSH 5.305 (H) 0.350 - 4.500 uIU/mL  Lipid panel     Status: Abnormal   Collection Time: 10/26/15  3:40 AM  Result Value Ref Range   Cholesterol 161 0 - 200 mg/dL   Triglycerides 162 (H) <150 mg/dL   HDL 38 (L) >40 mg/dL   Total CHOL/HDL Ratio 4.2 RATIO   VLDL 32 0 - 40 mg/dL   LDL Cholesterol 91 0 - 99 mg/dL  APTT     Status: None   Collection Time: 10/26/15  3:40 AM  Result Value Ref Range   aPTT 29 24 - 36 seconds  Protime-INR     Status: None   Collection Time: 10/26/15  3:40 AM  Result Value Ref Range   Prothrombin Time 14.4 11.4 - 15.2 seconds   INR 1.11     Studies/Results: Dg Chest 2 View  Result Date: 10/25/2015 CLINICAL DATA:  LEFT chest pain and LEFT breast pain for a few days. History of metastatic ovarian cancer. EXAM: CHEST  2 VIEW COMPARISON:  CT chest September 29, 2015 FINDINGS: Cardiomediastinal silhouette is normal. A few scattered sub cm pulmonary nodules suspected though, none were present on prior CT  chest. No pleural effusion or focal consolidation. No pneumothorax. Surgical clips project in RIGHT chest wall, status post RIGHT mastectomy. Single lumen RIGHT chest Port-A-Cath with distal tip projecting cavoatrial junction. No pneumothorax. Soft tissue planes and included osseous structures are nonsuspicious. IMPRESSION: A few suspected pulmonary nodules though, these were not present on prior CT scan, an could be artifact given interval apparent change. Recommend close attention on follow-up CT chest. Electronically Signed   By: Elon Alas M.D.   On: 10/25/2015 16:28   Ct Angio Chest Pe W And/or Wo Contrast  Result Date: 10/25/2015 CLINICAL DATA:  Chest and left arm discomfort intermittently for the past 3 months, worsening today. History of breast  cancer. Last chemotherapy was 4 days ago. EXAM: CT ANGIOGRAPHY CHEST WITH CONTRAST TECHNIQUE: Multidetector CT imaging of the chest was performed using the standard protocol during bolus administration of intravenous contrast. Multiplanar CT image reconstructions and MIPs were obtained to evaluate the vascular anatomy. CONTRAST:  100 cc Isovue 370 COMPARISON:  Chest CT from 09/29/2015 FINDINGS: Cardiovascular: No filling defect is identified in the pulmonary arterial tree to suggest pulmonary embolus. Coronary, aortic arch, and branch vessel atherosclerotic vascular disease. No acute aortic findings identified. Mediastinum/Nodes: No pathologic adenopathy identified. Port-A-Cath tip at the cavoatrial junction. Lungs/Pleura: No significant findings. Upper Abdomen: Cirrhosis. The area of the known omental/peritoneal metastatic disease is not included on today' s CT chest. Musculoskeletal: Right mastectomy. Chronic lucencies in the sternal manubrium, not previously hypermetabolic, probably hemangiomas. Review of the MIP images confirms the above findings. IMPRESSION: 1. No embolus or acute vascular findings identified in the chest. 2. Cirrhosis. Electronically Signed   By: Van Clines M.D.   On: 10/25/2015 19:06    Medications: Reviewed    @PROBHOSP @  1  NSTEMI  Pt familiar to me from clinic  I felt CP/arm pain/neck pain was more musculoskel Now with progressive symptoms  Called EMS yesterday  Init trop 0.6   Then 1.6  Now 1.1 Some discomfort earlier  No symptom free  On heparin and NTG   2. Onc  Pt is followed by Dr Alen Blew  Hx of peritoneal cancer and ascites (Rx cissplat, taxotere and avastin, then Doxil Rx complicated by hypersensitivity RXN [id number])   Now invasive breast CA (Gr 3 with positive sentinal LN Bx)  REceiving Carboplat and AUC.  Just received cycle 6 on 9/1-2  I have spoken to Dr Alen Blew on phone  He felt pt was OK to proceed with any intervention.  Got infusion last wk  9/1  Should nadir  in 7 to 10 days  Got neulastin to minimize  Plt have never dropped severely  I also spoke with brother (Dr Hans Eden, [facility] [phone]) to keep him updated     LOS: 0 days   Dorris Carnes 10/26/2015, 10:16 AM

## 2015-10-26 NOTE — Progress Notes (Signed)
TR BAND REMOVAL  LOCATION:  right radial  DEFLATED PER PROTOCOL:  Yes.    TIME BAND OFF / DRESSING APPLIED:   1730   SITE UPON ARRIVAL:   Level 2  SITE AFTER BAND REMOVAL:  Level 2  CIRCULATION SENSATION AND MOVEMENT:  Within Normal Limits  Yes.    COMMENTS:  5 CM hematoma proximal to site; marked; wrapped with [place].  Stable.

## 2015-10-26 NOTE — Consult Note (Signed)
Cardiology Consult    Patient ID: [name] MRN: [id number], DOB/AGE: 58/26/1957   Admit date: 10/25/2015 Date of Consult: 10/26/2015  Primary Physician: London Pepper, MD Primary Cardiologist: Harrington Challenger Requesting Provider: Olevia Bowens  Patient Profile     [name] is a 60 y.o. female with NSTEMI  Past Medical History   Past Medical History:  Diagnosis Date  . ADD (attention deficit disorder)   . Allergy    seasonal  . Anxiety   . Bipolar disorder ([place])   . Breast cancer ([place]) 1989   right breast  . Depression   . Diabetes mellitus    pt denies DM noe meds  . Hematochezia   . Hyperlipidemia   . Hypothyroidism   . Maintenance chemotherapy    Pt has chemo every 3 weeks (on Friday)  . Osteopenia 03/2009   t score -2.1 FRAX 4.6/0.4  . Premature menopause   . Sleep apnea    mild    Past Surgical History:  Procedure Laterality Date  . BREAST SURGERY  1989   RIGHT LUMPECTOMY, RADIATION AND CHEMO  . FOOT SURGERY  2013   BILATERAL   . HYSTEROSCOPY  2011   Polyp  . PELVIC LAPAROSCOPY/ Hysteroscopy  1996     Allergies  Allergies  Allergen Reactions  . Doxil [Doxorubicin Hcl Liposomal] Anaphylaxis    1st Doxil.   . Pollen Extract Other (See Comments)    Pollen and grass causes a lot sneezing    History of Present Illness    [name] is a 60 y.o. female with medical history significant of omental CA on maintanance chemo (since incurable) for 3 years (carboplatin), new diagnosis of breast CA in last weeks anxiety, mild sleep apnea not on CPAP came to [facility] emergency department with complaints of chest pain.  Per patient, she has been having on and off chest pain since May this year. She was evaluated by Dr. Dorris Carnes (cardiology), but not sure about the testing that was done. Yesterday in the afternoon, patient had a recurrence of the pain and decided to come to the emergency department. She is states that the pain is dull and aching, associated with  occasional diaphoresis, but denies dyspnea, palpitations, nausea, emesis, PND, orthopnea or pitting edema of the lower extremities. She states that it improves when she is lying. No LEE, PND, no orthopnea. No syncope. No palpitations.   Trop elevated to 0.4 and then 1.6. I was called after the second trop got in. I asked to start on Hep and nitro as pt was still in pain and transfer to [facility].   Inpatient Medications    . ALPRAZolam  0.5-1 mg Oral BID  . divalproex  500 mg Oral Daily  . divalproex  750 mg Oral QHS  . fluticasone  2 spray Each Nare QHS  . gabapentin  100 mg Oral TID  . lamoTRIgine  100 mg Oral BID  . levothyroxine  100 mcg Oral QAC breakfast  . pantoprazole  40 mg Oral Daily  . rOPINIRole  1.5 mg Oral QHS  . sodium chloride flush  3 mL Intravenous Q12H    Family History    Family History  Problem Relation Age of Onset  . Breast cancer Maternal Aunt 29  . Diabetes Maternal Grandmother   . Heart disease Maternal Grandmother   . Heart disease Maternal Grandfather   . Hypertension Paternal Grandfather   . Heart Problems Paternal Grandfather   . Breast cancer Maternal  Aunt     dx. early 71s; had negative GT approx 10 years ago  . Breast cancer Maternal Aunt     dx. 27s with recurrence  . Ovarian cancer Neg Hx   . Colon cancer Neg Hx   . Allergies Father   . Heart disease Mother   . Other Mother     hx of hysterectomy   . Liver cancer Cousin 74    +EtOH  . Cancer Cousin     paternal 1st cousin, once-removed dx. NOS cancer (maybe ovarian) in late 30s-40s  . Cancer Cousin     female paternal 2nd cousin d. of NOS cancer in her 20s-early 97s    Social History    Social History   Social History  . Marital status: Married    Spouse name: [name]  . Number of children: 0  . Years of education: 14   Occupational History  . unemployed [employer] [place]   Social History Main Topics  . Smoking status: Never Smoker  . Smokeless tobacco: Never Used  .  Alcohol use 0.0 oz/week     Comment: very rarely - maybe 1 glass wine every 3 mos  . Drug use: No  . Sexual activity: No   Other Topics Concern  . Not on file   Social History Narrative   Lives at home with husband.    Caffeine use:  Tea/soda occass     Review of Systems    General:  No chills, fever, night sweats or weight changes.  Cardiovascular:  No chest pain, dyspnea on exertion, edema, orthopnea, palpitations, paroxysmal nocturnal dyspnea. Dermatological: No rash, lesions/masses Respiratory: No cough, dyspnea Urologic: No hematuria, dysuria Abdominal:   No nausea, vomiting, diarrhea, bright red blood per rectum, melena, or hematemesis Neurologic:  No visual changes, wkns, changes in mental status. All other systems reviewed and are otherwise negative except as noted above.  Physical Exam    Blood pressure 106/63, pulse 80, temperature 98.2 F (36.8 C), temperature source Oral, resp. rate 17, height 5\' 2"  (1.575 m), weight 75.3 kg (166 lb), SpO2 96 %.  General: Pleasant, NAD Psych: Normal affect. Neuro: Alert and oriented X 3. Moves all extremities spontaneously. HEENT: Normal  Neck: Supple without bruits or JVD. Lungs:  Resp regular and unlabored, CTA. Heart: RRR no s3, s4, or murmurs. Abdomen: Soft, non-tender, non-distended, BS + x 4.  Extremities: No clubbing, cyanosis or edema. DP/PT/Radials 2+ and equal bilaterally.  Labs    Troponin Se [facility] Test)  Recent Labs  10/25/15 1653  TROPIPOC 0.41*    Recent Labs  10/25/15 2300 10/26/15 0340  TROPONINI 1.61* 1.10*   Lab Results  Component Value Date   WBC 13.0 (H) 10/26/2015   HGB 9.5 (L) 10/26/2015   HCT 31.2 (L) 10/26/2015   MCV 102.6 (H) 10/26/2015   PLT 74 (L) 10/26/2015    Recent Labs Lab 10/26/15 0340  NA 138  K 4.0  CL 102  CO2 31  BUN 14  CREATININE 0.58  CALCIUM 9.3  PROT 7.4  BILITOT 0.4  ALKPHOS 83  ALT 15  AST 28  GLUCOSE 106*   Lab Results  Component Value Date    CHOL 230 (H) 08/24/2009   HDL 59.60 08/24/2009   LDLCALC 101 (H) 07/07/2007   TRIG 165.0 (H) 08/24/2009   Lab Results  Component Value Date   DDIMER 1.58 (H) 10/25/2015     Radiology Studies    Dg Chest 2 View  Result Date: 10/25/2015 CLINICAL DATA:  LEFT chest pain and LEFT breast pain for a few days. History of metastatic ovarian cancer. EXAM: CHEST  2 VIEW COMPARISON:  CT chest September 29, 2015 FINDINGS: Cardiomediastinal silhouette is normal. A few scattered sub cm pulmonary nodules suspected though, none were present on prior CT chest. No pleural effusion or focal consolidation. No pneumothorax. Surgical clips project in RIGHT chest wall, status post RIGHT mastectomy. Single lumen RIGHT chest Port-A-Cath with distal tip projecting cavoatrial junction. No pneumothorax. Soft tissue planes and included osseous structures are nonsuspicious. IMPRESSION: A few suspected pulmonary nodules though, these were not present on prior CT scan, an could be artifact given interval apparent change. Recommend close attention on follow-up CT chest. Electronically Signed   By: Elon Alas M.D.   On: 10/25/2015 16:28   Ct Chest W Contrast  Result Date: 09/29/2015 CLINICAL DATA:  60 year old female with history of ovarian cancer with metastatic disease to the omentum originally diagnosed in 2014. Additional history of breast cancer status post right-sided lumpectomy. Undergoing ongoing chemotherapy. Known metastatic disease to the omentum. EXAM: CT CHEST, ABDOMEN, AND PELVIS WITH CONTRAST TECHNIQUE: Multidetector CT imaging of the chest, abdomen and pelvis was performed following the standard protocol during bolus administration of intravenous contrast. CONTRAST:  19mL ISOVUE-300 IOPAMIDOL (ISOVUE-300) INJECTION 61% COMPARISON:  CT of the chest, abdomen and pelvis 05/05/2015. FINDINGS: CT CHEST FINDINGS Cardiovascular: Heart size is normal. There is no significant pericardial fluid, thickening or pericardial  calcification. There is aortic atherosclerosis, as well as atherosclerosis of the great vessels of the mediastinum and the coronary arteries, including calcified atherosclerotic plaque in the left main coronary artery. Right internal jugular single-lumen porta cath with tip terminating in the distal superior vena cava. Mediastinum/Nodes: No pathologically enlarged mediastinal, hilar or internal mammary lymph nodes. Esophagus is unremarkable in appearance. No axillary lymphadenopathy. Lungs/Pleura: No suspicious appearing pulmonary nodules or masses. No acute consolidative airspace disease. No pleural effusions. Musculoskeletal: Status post right modified radical mastectomy and right axillary lymph node dissection. There are no aggressive appearing lytic or blastic lesions noted in the visualized portions of the skeleton. CT ABDOMEN PELVIS FINDINGS Hepatobiliary: Liver has a shrunken appearance and nodular contour, suggestive of early cirrhosis. No definite cystic or solid hepatic lesions are noted. No intra or extrahepatic biliary ductal dilatation. Gallbladder is normal in appearance. Pancreas: No pancreatic mass. No pancreatic ductal dilatation. No pancreatic or peripancreatic fluid or inflammatory changes. Spleen: Spleen is mildly enlarged measuring 13.0 x 5.2 x 9.8 cm (estimated splenic volume of 336 mL). Adrenals/Urinary Tract: The appearance of the kidneys and adrenal glands is normal bilaterally. No hydroureteronephrosis. Urinary bladder is nearly decompressed, but otherwise unremarkable in appearance. Stomach/Bowel: Normal appearance of the stomach. No pathologic dilatation of small bowel or colon. Appendix is not confidently identified may be surgically absent. Vascular/Lymphatic: Aortic atherosclerosis, without evidence of aneurysm or dissection in the abdominal or pelvic vasculature. Recannulized paraumbilical vein. Portal vein is dilated measuring up to 16 mm in diameter. No lymphadenopathy noted in the  abdomen or pelvis. Reproductive: Uterus and ovaries are unremarkable in appearance. Other: Bulky intraperitoneal metastatic disease is again noted. The largest of these metastatic lesions are associated with the omentum, and appear generally stable compared to the prior examination. The previously referenced lesion in the anterior aspect of the abdomen slightly to the left of midline currently measures 11.9 x 2.5 cm (image 81 of series 2). The largest lesion in the right subhepatic region currently measures 8.6 x 4.0  cm (image 74 of series 2). Several other smaller lesions are also noted, some of which appear smaller than the prior examination, while others appear larger. Overall burden of disease is very similar to the prior study. Trace volume of ascites, presumably malignant. No pneumoperitoneum. Musculoskeletal: There are no aggressive appearing lytic or blastic lesions noted in the visualized portions of the skeleton. IMPRESSION: 1. Overall, the widespread intraperitoneal metastatic disease appears essentially stable compared to the prior examination, as discussed above. 2. No new metastatic disease in the thorax or in the solid or hollow viscera of the abdomen and pelvis. 3. Stigmata of cirrhosis and portal hypertension redemonstrated, including mild splenomegaly. 4. Additional incidental findings, as above. Electronically Signed   By: Vinnie Langton M.D.   On: 09/29/2015 15:38   Ct Angio Chest Pe W And/or Wo Contrast  Result Date: 10/25/2015 CLINICAL DATA:  Chest and left arm discomfort intermittently for the past 3 months, worsening today. History of breast cancer. Last chemotherapy was 4 days ago. EXAM: CT ANGIOGRAPHY CHEST WITH CONTRAST TECHNIQUE: Multidetector CT imaging of the chest was performed using the standard protocol during bolus administration of intravenous contrast. Multiplanar CT image reconstructions and MIPs were obtained to evaluate the vascular anatomy. CONTRAST:  100 cc Isovue 370  COMPARISON:  Chest CT from 09/29/2015 FINDINGS: Cardiovascular: No filling defect is identified in the pulmonary arterial tree to suggest pulmonary embolus. Coronary, aortic arch, and branch vessel atherosclerotic vascular disease. No acute aortic findings identified. Mediastinum/Nodes: No pathologic adenopathy identified. Port-A-Cath tip at the cavoatrial junction. Lungs/Pleura: No significant findings. Upper Abdomen: Cirrhosis. The area of the known omental/peritoneal metastatic disease is not included on today' s CT chest. Musculoskeletal: Right mastectomy. Chronic lucencies in the sternal manubrium, not previously hypermetabolic, probably hemangiomas. Review of the MIP images confirms the above findings. IMPRESSION: 1. No embolus or acute vascular findings identified in the chest. 2. Cirrhosis. Electronically Signed   By: Van Clines M.D.   On: 10/25/2015 19:06   Ct Abdomen Pelvis W Contrast  Result Date: 09/29/2015 CLINICAL DATA:  60 year old female with history of ovarian cancer with metastatic disease to the omentum originally diagnosed in 2014. Additional history of breast cancer status post right-sided lumpectomy. Undergoing ongoing chemotherapy. Known metastatic disease to the omentum. EXAM: CT CHEST, ABDOMEN, AND PELVIS WITH CONTRAST TECHNIQUE: Multidetector CT imaging of the chest, abdomen and pelvis was performed following the standard protocol during bolus administration of intravenous contrast. CONTRAST:  141mL ISOVUE-300 IOPAMIDOL (ISOVUE-300) INJECTION 61% COMPARISON:  CT of the chest, abdomen and pelvis 05/05/2015. FINDINGS: CT CHEST FINDINGS Cardiovascular: Heart size is normal. There is no significant pericardial fluid, thickening or pericardial calcification. There is aortic atherosclerosis, as well as atherosclerosis of the great vessels of the mediastinum and the coronary arteries, including calcified atherosclerotic plaque in the left main coronary artery. Right internal jugular  single-lumen porta cath with tip terminating in the distal superior vena cava. Mediastinum/Nodes: No pathologically enlarged mediastinal, hilar or internal mammary lymph nodes. Esophagus is unremarkable in appearance. No axillary lymphadenopathy. Lungs/Pleura: No suspicious appearing pulmonary nodules or masses. No acute consolidative airspace disease. No pleural effusions. Musculoskeletal: Status post right modified radical mastectomy and right axillary lymph node dissection. There are no aggressive appearing lytic or blastic lesions noted in the visualized portions of the skeleton. CT ABDOMEN PELVIS FINDINGS Hepatobiliary: Liver has a shrunken appearance and nodular contour, suggestive of early cirrhosis. No definite cystic or solid hepatic lesions are noted. No intra or extrahepatic biliary ductal dilatation. Gallbladder is  normal in appearance. Pancreas: No pancreatic mass. No pancreatic ductal dilatation. No pancreatic or peripancreatic fluid or inflammatory changes. Spleen: Spleen is mildly enlarged measuring 13.0 x 5.2 x 9.8 cm (estimated splenic volume of 336 mL). Adrenals/Urinary Tract: The appearance of the kidneys and adrenal glands is normal bilaterally. No hydroureteronephrosis. Urinary bladder is nearly decompressed, but otherwise unremarkable in appearance. Stomach/Bowel: Normal appearance of the stomach. No pathologic dilatation of small bowel or colon. Appendix is not confidently identified may be surgically absent. Vascular/Lymphatic: Aortic atherosclerosis, without evidence of aneurysm or dissection in the abdominal or pelvic vasculature. Recannulized paraumbilical vein. Portal vein is dilated measuring up to 16 mm in diameter. No lymphadenopathy noted in the abdomen or pelvis. Reproductive: Uterus and ovaries are unremarkable in appearance. Other: Bulky intraperitoneal metastatic disease is again noted. The largest of these metastatic lesions are associated with the omentum, and appear generally  stable compared to the prior examination. The previously referenced lesion in the anterior aspect of the abdomen slightly to the left of midline currently measures 11.9 x 2.5 cm (image 81 of series 2). The largest lesion in the right subhepatic region currently measures 8.6 x 4.0 cm (image 74 of series 2). Several other smaller lesions are also noted, some of which appear smaller than the prior examination, while others appear larger. Overall burden of disease is very similar to the prior study. Trace volume of ascites, presumably malignant. No pneumoperitoneum. Musculoskeletal: There are no aggressive appearing lytic or blastic lesions noted in the visualized portions of the skeleton. IMPRESSION: 1. Overall, the widespread intraperitoneal metastatic disease appears essentially stable compared to the prior examination, as discussed above. 2. No new metastatic disease in the thorax or in the solid or hollow viscera of the abdomen and pelvis. 3. Stigmata of cirrhosis and portal hypertension redemonstrated, including mild splenomegaly. 4. Additional incidental findings, as above. Electronically Signed   By: Vinnie Langton M.D.   On: 09/29/2015 15:38    ECG & Cardiac Imaging    NSR with lateral T wave inversions  Assessment & Plan    39 y o woman with PMH of omental CA and new diagnosis of breast CA. She has not fully completed her breast CA evaluation and there is no definite treatment plan in place per the patient. Seems like the onc team is leaning towards trialing chemo with possible surgery in the future. Now with NSTEMI. Likely a type 1 event but could be vasospasm as well. Complicating issue will be her low PLT and potential need for future surgery. Trop is already down trending so presume MI happened 1 day ago.   Recommendations: - ASA, Hep drip and nitro drip (all of which already been started) - Need to discuss this am with onc team regarding timing of breast surgery. Might need to place BMS.     - NPO for possible cath in am.  Signed, Cristina Gong, MD 10/26/2015, 5:53 AM

## 2015-10-26 NOTE — Progress Notes (Signed)
ANTICOAGULATION CONSULT NOTE - Initial Consult  Pharmacy Consult for Heparin Indication: chest pain/ACS  Allergies  Allergen Reactions  . Doxil [Doxorubicin Hcl Liposomal] Anaphylaxis    1st Doxil.   . Pollen Extract Other (See Comments)    Pollen and grass causes a lot sneezing    Patient Measurements: Height: 5\' 2"  (157.5 cm) Weight: 167 lb (75.8 kg) IBW/kg (Calculated) : 50.1 Heparin Dosing Weight:   Vital Signs: Temp: 98.1 F (36.7 C) (09/06 0203) Temp Source: Oral (09/06 0203) BP: 135/95 (09/06 0203) Pulse Rate: 78 (09/06 0203)  Labs:  Recent Labs  10/25/15 1649 10/25/15 2300  HGB 10.1*  --   HCT 32.9*  --   PLT 77*  --   CREATININE 0.59  --   TROPONINI  --  1.61*    Estimated Creatinine Clearance: 73.1 mL/min (by C-G formula based on SCr of 0.8 mg/dL).   Medical History: Past Medical History:  Diagnosis Date  . ADD (attention deficit disorder)   . Allergy    seasonal  . Anxiety   . Bipolar disorder ([place])   . Breast cancer ([place]) 1989   right breast  . Depression   . Diabetes mellitus    pt denies DM noe meds  . Hematochezia   . Hyperlipidemia   . Hypothyroidism   . Maintenance chemotherapy    Pt has chemo every 3 weeks (on Friday)  . Osteopenia 03/2009   t score -2.1 FRAX 4.6/0.4  . Premature menopause   . Sleep apnea    mild    Medications:  Infusions:  . heparin    . nitroGLYCERIN      Assessment: Patient with + CE.  MD notes and aware of low PLT count.  Pharmacy will follow closely.  Baseline anticoags ordered.  Enoxaparin ordered but not charted.  Goal of Therapy:  Heparin level 0.3-0.7 units/ml Monitor platelets by anticoagulation protocol: Yes   Plan:  Heparin bolus 3000 units iv x1 Heparin drip at 900  units/hr Daily CBC Next heparin level at [place], [place] 10/26/2015,2:25 AM

## 2015-10-26 NOTE — Interval H&P Note (Signed)
History and Physical Interval Note:  10/26/2015 12:20 PM  [name]  has presented today for surgery, with the diagnosis of cp  The various methods of treatment have been discussed with the patient and family. After consideration of risks, benefits and other options for treatment, the patient has consented to  Procedure(s): Left Heart Cath and Coronary Angiography (N/A) as a surgical intervention .  The patient's history has been reviewed, patient examined, no change in status, stable for surgery.  I have reviewed the patient's chart and labs.  Questions were answered to the patient's satisfaction.    Cath Lab Visit (complete for each Cath Lab visit)  Clinical Evaluation Leading to the Procedure:   ACS: Yes.    Non-ACS:    Anginal Classification: CCS IV  Anti-ischemic medical therapy: No Therapy  Non-Invasive Test Results: No non-invasive testing performed  Prior CABG: No previous CABG   Summer Parthasarathy

## 2015-10-26 NOTE — Progress Notes (Signed)
Patient from [place] long by [employer] . Paged on call cardiologist to inform about patient's admission. Cardiology should round in the morning. Still NPO. Patient states she is chest pain free. Is on Nitroglycerin and Heparin drip. Patient's vitals signs WNL. Patient had no further questions. Considered high fall risk, bed alarm in place. Will continue to monitor.

## 2015-10-26 NOTE — Progress Notes (Signed)
Blood pressure taken in legs is 30 - 50 points higher than in right arm.  Cecilie Kicks, NP informed.

## 2015-10-26 NOTE — Progress Notes (Signed)
0042-Received call from lab for critical  troponin lab, Baltazar Najjar NP notified.  0130- EKG obtained per order.   0200-  Orders received and followed. Pt to be transported to [facility], room 3W20 by [facility]. [facility] called. Awaiting lab draw.   0315- Report given to Lorriane Shire, RN on 3W at [facility].   43- Report given to [facility].  0400- Pt currently asleep,  Resting comfortably. Will continue to monitor pt closely.

## 2015-10-26 NOTE — Progress Notes (Addendum)
CRITICAL VALUE ALERT  Critical value received:  Troponin 1.61  Date of notification:  10/26/2015  Time of notification:  0042  Critical value read back:Yes.    Nurse who received alert:  Janell Quiet RN  MD notified (1st page):  Tylene Fantasia, NP  Time of first page:  0043  MD notified (2nd page): Tylene Fantasia, NP  Time of second page: 0127  Responding MD:    Time MD responded:

## 2015-10-26 NOTE — H&P (View-Only) (Signed)
Subjective: No chest or arm pain at present  Objective: Vitals:   10/26/15 0253 10/26/15 0256 10/26/15 0303 10/26/15 0440  BP: 133/82 128/87 126/80 106/63  Pulse:    80  Resp:    17  Temp:    98.2 F (36.8 C)  TempSrc:    Oral  SpO2:   95% 96%  Weight:    166 lb (75.3 kg)  Height:    5\' 2"  (1.575 m)   Weight change:   Intake/Output Summary (Last 24 hours) at 10/26/15 1016 Last data filed at 10/26/15 0500  Gross per 24 hour  Intake           266.08 ml  Output                0 ml  Net           266.08 ml    General: Alert, awake, oriented x3, in no acute distress Neck:  JVP is normal Heart: Regular rate and rhythm, without murmurs, rubs, gallops.  Lungs: Clear to auscultation.  No rales or wheezes. Exemities:  No edema.   Neuro: Grossly intact, nonfocal.  Tele  SR    Lab Results: Results for orders placed or performed during the hospital encounter of 10/25/15 (from the past 24 hour(s))  Basic metabolic panel     Status: Abnormal   Collection Time: 10/25/15  4:49 PM  Result Value Ref Range   Sodium 139 135 - 145 mmol/L   Potassium 4.3 3.5 - 5.1 mmol/L   Chloride 103 101 - 111 mmol/L   CO2 30 22 - 32 mmol/L   Glucose, Bld 116 (H) 65 - 99 mg/dL   BUN 14 6 - 20 mg/dL   Creatinine, Ser 0.59 0.44 - 1.00 mg/dL   Calcium 9.1 8.9 - 10.3 mg/dL   GFR calc non Af Amer >60 >60 mL/min   GFR calc Af Amer >60 >60 mL/min   Anion gap 6 5 - 15  CBC     Status: Abnormal   Collection Time: 10/25/15  4:49 PM  Result Value Ref Range   WBC 18.1 (H) 4.0 - 10.5 K/uL   RBC 3.19 (L) 3.87 - 5.11 MIL/uL   Hemoglobin 10.1 (L) 12.0 - 15.0 g/dL   HCT 32.9 (L) 36.0 - 46.0 %   MCV 103.1 (H) 78.0 - 100.0 fL   MCH 31.7 26.0 - 34.0 pg   MCHC 30.7 30.0 - 36.0 g/dL   RDW 16.1 (H) 11.5 - 15.5 %   Platelets 77 (L) 150 - 400 K/uL  D-dimer, quantitative (not at [facility])     Status: Abnormal   Collection Time: 10/25/15  4:49 PM  Result Value Ref Range   D-Dimer, Quant 1.58 (H) 0.00 - 0.50  ug/mL-FEU  I-stat troponin, ED     Status: Abnormal   Collection Time: 10/25/15  4:53 PM  Result Value Ref Range   Troponin i, poc 0.41 (HH) 0.00 - 0.08 ng/mL   Comment NOTIFIED PHYSICIAN    Comment 3          Troponin I     Status: Abnormal   Collection Time: 10/25/15 11:00 PM  Result Value Ref Range   Troponin I 1.61 (HH) <0.03 ng/mL  Valproic acid level     Status: Abnormal   Collection Time: 10/25/15 11:00 PM  Result Value Ref Range   Valproic Acid Lvl 35 (L) 50.0 - 100.0 ug/mL  Troponin I     Status: Abnormal  Collection Time: 10/26/15  3:40 AM  Result Value Ref Range   Troponin I 1.10 (HH) <0.03 ng/mL  CBC WITH DIFFERENTIAL     Status: Abnormal   Collection Time: 10/26/15  3:40 AM  Result Value Ref Range   WBC 13.0 (H) 4.0 - 10.5 K/uL   RBC 3.04 (L) 3.87 - 5.11 MIL/uL   Hemoglobin 9.5 (L) 12.0 - 15.0 g/dL   HCT 31.2 (L) 36.0 - 46.0 %   MCV 102.6 (H) 78.0 - 100.0 fL   MCH 31.3 26.0 - 34.0 pg   MCHC 30.4 30.0 - 36.0 g/dL   RDW 16.2 (H) 11.5 - 15.5 %   Platelets 74 (L) 150 - 400 K/uL   Neutrophils Relative % 80 %   Lymphocytes Relative 12 %   Monocytes Relative 8 %   Eosinophils Relative 0 %   Basophils Relative 0 %   Neutro Abs 10.4 (H) 1.7 - 7.7 K/uL   Lymphs Abs 1.6 0.7 - 4.0 K/uL   Monocytes Absolute 1.0 0.1 - 1.0 K/uL   Eosinophils Absolute 0.0 0.0 - 0.7 K/uL   Basophils Absolute 0.0 0.0 - 0.1 K/uL   Smear Review MORPHOLOGY UNREMARKABLE   Comprehensive metabolic panel     Status: Abnormal   Collection Time: 10/26/15  3:40 AM  Result Value Ref Range   Sodium 138 135 - 145 mmol/L   Potassium 4.0 3.5 - 5.1 mmol/L   Chloride 102 101 - 111 mmol/L   CO2 31 22 - 32 mmol/L   Glucose, Bld 106 (H) 65 - 99 mg/dL   BUN 14 6 - 20 mg/dL   Creatinine, Ser 0.58 0.44 - 1.00 mg/dL   Calcium 9.3 8.9 - 10.3 mg/dL   Total Protein 7.4 6.5 - 8.1 g/dL   Albumin 3.3 (L) 3.5 - 5.0 g/dL   AST 28 15 - 41 U/L   ALT 15 14 - 54 U/L   Alkaline Phosphatase 83 38 - 126 U/L   Total  Bilirubin 0.4 0.3 - 1.2 mg/dL   GFR calc non Af Amer >60 >60 mL/min   GFR calc Af Amer >60 >60 mL/min   Anion gap 5 5 - 15  TSH     Status: Abnormal   Collection Time: 10/26/15  3:40 AM  Result Value Ref Range   TSH 5.305 (H) 0.350 - 4.500 uIU/mL  Lipid panel     Status: Abnormal   Collection Time: 10/26/15  3:40 AM  Result Value Ref Range   Cholesterol 161 0 - 200 mg/dL   Triglycerides 162 (H) <150 mg/dL   HDL 38 (L) >40 mg/dL   Total CHOL/HDL Ratio 4.2 RATIO   VLDL 32 0 - 40 mg/dL   LDL Cholesterol 91 0 - 99 mg/dL  APTT     Status: None   Collection Time: 10/26/15  3:40 AM  Result Value Ref Range   aPTT 29 24 - 36 seconds  Protime-INR     Status: None   Collection Time: 10/26/15  3:40 AM  Result Value Ref Range   Prothrombin Time 14.4 11.4 - 15.2 seconds   INR 1.11     Studies/Results: Dg Chest 2 View  Result Date: 10/25/2015 CLINICAL DATA:  LEFT chest pain and LEFT breast pain for a few days. History of metastatic ovarian cancer. EXAM: CHEST  2 VIEW COMPARISON:  CT chest September 29, 2015 FINDINGS: Cardiomediastinal silhouette is normal. A few scattered sub cm pulmonary nodules suspected though, none were present on prior CT  chest. No pleural effusion or focal consolidation. No pneumothorax. Surgical clips project in RIGHT chest wall, status post RIGHT mastectomy. Single lumen RIGHT chest Port-A-Cath with distal tip projecting cavoatrial junction. No pneumothorax. Soft tissue planes and included osseous structures are nonsuspicious. IMPRESSION: A few suspected pulmonary nodules though, these were not present on prior CT scan, an could be artifact given interval apparent change. Recommend close attention on follow-up CT chest. Electronically Signed   By: Elon Alas M.D.   On: 10/25/2015 16:28   Ct Angio Chest Pe W And/or Wo Contrast  Result Date: 10/25/2015 CLINICAL DATA:  Chest and left arm discomfort intermittently for the past 3 months, worsening today. History of breast  cancer. Last chemotherapy was 4 days ago. EXAM: CT ANGIOGRAPHY CHEST WITH CONTRAST TECHNIQUE: Multidetector CT imaging of the chest was performed using the standard protocol during bolus administration of intravenous contrast. Multiplanar CT image reconstructions and MIPs were obtained to evaluate the vascular anatomy. CONTRAST:  100 cc Isovue 370 COMPARISON:  Chest CT from 09/29/2015 FINDINGS: Cardiovascular: No filling defect is identified in the pulmonary arterial tree to suggest pulmonary embolus. Coronary, aortic arch, and branch vessel atherosclerotic vascular disease. No acute aortic findings identified. Mediastinum/Nodes: No pathologic adenopathy identified. Port-A-Cath tip at the cavoatrial junction. Lungs/Pleura: No significant findings. Upper Abdomen: Cirrhosis. The area of the known omental/peritoneal metastatic disease is not included on today' s CT chest. Musculoskeletal: Right mastectomy. Chronic lucencies in the sternal manubrium, not previously hypermetabolic, probably hemangiomas. Review of the MIP images confirms the above findings. IMPRESSION: 1. No embolus or acute vascular findings identified in the chest. 2. Cirrhosis. Electronically Signed   By: Van Clines M.D.   On: 10/25/2015 19:06    Medications: Reviewed    @PROBHOSP @  1  NSTEMI  Pt familiar to me from clinic  I felt CP/arm pain/neck pain was more musculoskel Now with progressive symptoms  Called EMS yesterday  Init trop 0.6   Then 1.6  Now 1.1 Some discomfort earlier  No symptom free  On heparin and NTG   2. Onc  Pt is followed by Dr Alen Blew  Hx of peritoneal cancer and ascites (Rx cissplat, taxotere and avastin, then Doxil Rx complicated by hypersensitivity RXN 2017)   Now invasive breast CA (Gr 3 with positive sentinal LN Bx)  REceiving Carboplat and AUC.  Just received cycle 6 on 9/1-2  I have spoken to Dr Alen Blew on phone  He felt pt was OK to proceed with any intervention.  Got infusion last wk  9/1  Should nadir  in 7 to 10 days  Got neulastin to minimize  Plt have never dropped severely  I also spoke with brother (Dr Hans Eden, [facility] [phone]) to keep him updated     LOS: 0 days   Dorris Carnes 10/26/2015, 10:16 AM

## 2015-10-26 NOTE — Progress Notes (Signed)
Pt's troponin 1.61. No active CP but pt did present with this for admission. + left arm pain 3/10. No SOB. VSS. Spoke cardio fellow on call. Recommends ASA now, Heparin drip, NTG drip and TF to cone for possible cardiac cath. Pt with hx of thrombocytopenia, last level 77000. PharmD and cardio agree that Heparin should be fine with close monitoring of levels. NPO for now except meds. Accepting MD, Dr. Alcario Drought, of [facility] is aware of transfer. Cardio will see in am and/or on arrival.  [facility], NP [facility]

## 2015-10-26 NOTE — Progress Notes (Signed)
PROGRESS NOTE  [name] [id number] DOB: [birth year] DOA: 10/25/2015 PCP: London Pepper, MD  HPI/Recap of past 24 hours:  Patient is seen after returned from cath, currently no chest pain  Assessment/Plan: Principal Problem:   Chest pain Active Problems:   Hypothyroidism   Hyperlipidemia   Obstructive sleep apnea   Anxiety   Bipolar disorder ([place])   Malignant neoplasm of female breast ([place])   Pancytopenia ([facility])   Elevated troponin   Leukocytosis   NSTEMI (non-ST elevated myocardial infarction) [facility])   Chest pain   Elevated troponin Cardiology consulted, cath on 9/6 with DEs placement, on asa/plavix/statin/coreg,management per cardiology  Active Problems:   Hypothyroidism Continue levothyroxine 100 g by mouth daily. TSH level 5.3    Hyperlipidemia Continue statin,  lifestyle modifications.  fasting lipids ldl 91, triglycerides 162.    Obstructive sleep apnea Not on CPAP. Supplemental oxygen at bedtime.    Anxiety Continue alprazolam. Continue lorazepam as needed.    Bipolar disorder ([place]) Stable at this time. Continue Lamictal 100 mg by mouth twice a day. Depakote 500 mg daily in the morning. Depakote 750 mg by mouth at bedtime.     Malignant neoplasm of female breast [facility]) Getting chemotherapy therapy , last dose of carboplatin on 9/1, defer management to oncology.    Pancytopenia ([place]) The patient is usually pancytopenic, but received Neulasta on 9/2. Monitor CBC periodically.    Leukocytosis The patient received Neulasta last week after chemotherapy. She is afebrile. Follow-up WBC in the morning.      DVT prophylaxis: SCDs.. Code Status: Full code. Family Communication: Her husband, Sonie Omerovic, was present in the ED room. Disposition Plan: pending, need cardiology clearance Consults called: cardiology    Procedures:  Left heart cath  With DES to distal lad on 9/6  Antibiotics:  none   Objective: BP 113/81    Pulse 80   Temp 98.2 F (36.8 C) (Oral)   Resp 17   Ht 5\' 2"  (1.575 m)   Wt 75.3 kg (166 lb)   SpO2 96%   BMI 30.36 kg/m   Intake/Output Summary (Last 24 hours) at 10/26/15 1139 Last data filed at 10/26/15 0500  Gross per 24 hour  Intake           266.08 ml  Output                0 ml  Net           266.08 ml   Filed Weights   10/25/15 1553 10/25/15 2112 10/26/15 0440  Weight: 74.8 kg (165 lb) 75.8 kg (167 lb) 75.3 kg (166 lb)    Exam:   General:  NAD  Cardiovascular: RRR  Respiratory: CTABL  Abdomen: Soft/ND/NT, positive BS  Musculoskeletal: No Edema  Neuro: aaox3  Data Reviewed: Basic Metabolic Panel:  Recent Labs Lab 10/21/15 1047 10/25/15 1649 10/26/15 0340  NA 141 139 138  K 4.4 4.3 4.0  CL  --  103 102  CO2 26 30 31   GLUCOSE 85 116* 106*  BUN 11.6 14 14   CREATININE 0.7 0.59 0.58  CALCIUM 9.3 9.1 9.3   Liver Function Tests:  Recent Labs Lab 10/21/15 1047 10/26/15 0340  AST 27 28  ALT 11 15  ALKPHOS 77 83  BILITOT <0.30 0.4  PROT 8.0 7.4  ALBUMIN 3.4* 3.3*   No results for input(s): LIPASE, AMYLASE in the last 168 hours. No results for input(s): AMMONIA in the last 168 hours. CBC:  Recent  Labs Lab 10/21/15 1047 10/25/15 1649 10/26/15 0340  WBC 2.4* 18.1* 13.0*  NEUTROABS 1.1*  --  10.4*  HGB 9.8* 10.1* 9.5*  HCT 31.5* 32.9* 31.2*  MCV 99.9 103.1* 102.6*  PLT 83* 77* 74*   Cardiac Enzymes:    Recent Labs Lab 10/25/15 2300 10/26/15 0340  TROPONINI 1.61* 1.10*   BNP (last 3 results) No results for input(s): BNP in the last 8760 hours.  ProBNP (last 3 results) No results for input(s): PROBNP in the last 8760 hours.  CBG: No results for input(s): GLUCAP in the last 168 hours.  No results found for this or any previous visit (from the past 240 hour(s)).   Studies: Dg Chest 2 View  Result Date: 10/25/2015 CLINICAL DATA:  LEFT chest pain and LEFT breast pain for a few days. History of metastatic ovarian cancer.  EXAM: CHEST  2 VIEW COMPARISON:  CT chest September 29, 2015 FINDINGS: Cardiomediastinal silhouette is normal. A few scattered sub cm pulmonary nodules suspected though, none were present on prior CT chest. No pleural effusion or focal consolidation. No pneumothorax. Surgical clips project in RIGHT chest wall, status post RIGHT mastectomy. Single lumen RIGHT chest Port-A-Cath with distal tip projecting cavoatrial junction. No pneumothorax. Soft tissue planes and included osseous structures are nonsuspicious. IMPRESSION: A few suspected pulmonary nodules though, these were not present on prior CT scan, an could be artifact given interval apparent change. Recommend close attention on follow-up CT chest. Electronically Signed   By: Elon Alas M.D.   On: 10/25/2015 16:28   Ct Angio Chest Pe W And/or Wo Contrast  Result Date: 10/25/2015 CLINICAL DATA:  Chest and left arm discomfort intermittently for the past 3 months, worsening today. History of breast cancer. Last chemotherapy was 4 days ago. EXAM: CT ANGIOGRAPHY CHEST WITH CONTRAST TECHNIQUE: Multidetector CT imaging of the chest was performed using the standard protocol during bolus administration of intravenous contrast. Multiplanar CT image reconstructions and MIPs were obtained to evaluate the vascular anatomy. CONTRAST:  100 cc Isovue 370 COMPARISON:  Chest CT from 09/29/2015 FINDINGS: Cardiovascular: No filling defect is identified in the pulmonary arterial tree to suggest pulmonary embolus. Coronary, aortic arch, and branch vessel atherosclerotic vascular disease. No acute aortic findings identified. Mediastinum/Nodes: No pathologic adenopathy identified. Port-A-Cath tip at the cavoatrial junction. Lungs/Pleura: No significant findings. Upper Abdomen: Cirrhosis. The area of the known omental/peritoneal metastatic disease is not included on today' s CT chest. Musculoskeletal: Right mastectomy. Chronic lucencies in the sternal manubrium, not previously  hypermetabolic, probably hemangiomas. Review of the MIP images confirms the above findings. IMPRESSION: 1. No embolus or acute vascular findings identified in the chest. 2. Cirrhosis. Electronically Signed   By: Van Clines M.D.   On: 10/25/2015 19:06    Scheduled Meds: . ALPRAZolam  0.5-1 mg Oral BID  . [START ON 10/27/2015] aspirin  81 mg Oral Pre-Cath  . divalproex  500 mg Oral Daily  . divalproex  750 mg Oral QHS  . fluticasone  2 spray Each Nare QHS  . gabapentin  100 mg Oral TID  . lamoTRIgine  100 mg Oral BID  . levothyroxine  100 mcg Oral QAC breakfast  . pantoprazole  40 mg Oral Daily  . rOPINIRole  1.5 mg Oral QHS  . sodium chloride flush  3 mL Intravenous Q12H  . sodium chloride flush  3 mL Intravenous Q12H    Continuous Infusions: . sodium chloride    . heparin 900 Units/hr (10/26/15 0500)  . nitroGLYCERIN  5 mcg/min (10/26/15 0500)     Time spent: 67mins  Mckinsley Koelzer MD, PhD  [facility] Pager [phone]. If 7PM-7AM, please contact night-coverage at [email or web address], password [facility] 10/26/2015, 11:39 AM  LOS: 0 days

## 2015-10-27 ENCOUNTER — Inpatient Hospital Stay (HOSPITAL_COMMUNITY): Payer: PPO

## 2015-10-27 ENCOUNTER — Encounter (HOSPITAL_COMMUNITY): Payer: Self-pay | Admitting: Cardiovascular Disease

## 2015-10-27 ENCOUNTER — Telehealth: Payer: Self-pay

## 2015-10-27 DIAGNOSIS — E785 Hyperlipidemia, unspecified: Secondary | ICD-10-CM | POA: Diagnosis not present

## 2015-10-27 DIAGNOSIS — I214 Non-ST elevation (NSTEMI) myocardial infarction: Secondary | ICD-10-CM | POA: Diagnosis not present

## 2015-10-27 DIAGNOSIS — I209 Angina pectoris, unspecified: Secondary | ICD-10-CM | POA: Diagnosis not present

## 2015-10-27 DIAGNOSIS — R079 Chest pain, unspecified: Secondary | ICD-10-CM

## 2015-10-27 LAB — CBC
HEMATOCRIT: 29.6 % — AB (ref 36.0–46.0)
HEMOGLOBIN: 8.5 g/dL — AB (ref 12.0–15.0)
MCH: 30.6 pg (ref 26.0–34.0)
MCHC: 28.7 g/dL — ABNORMAL LOW (ref 30.0–36.0)
MCV: 106.5 fL — AB (ref 78.0–100.0)
Platelets: 70 10*3/uL — ABNORMAL LOW (ref 150–400)
RBC: 2.78 MIL/uL — ABNORMAL LOW (ref 3.87–5.11)
RDW: 16.1 % — ABNORMAL HIGH (ref 11.5–15.5)
WBC: 5.9 10*3/uL (ref 4.0–10.5)

## 2015-10-27 LAB — ECHOCARDIOGRAM COMPLETE
Height: 62 in
WEIGHTICAEL: 2606.72 [oz_av]

## 2015-10-27 LAB — BASIC METABOLIC PANEL
ANION GAP: 5 (ref 5–15)
BUN: 12 mg/dL (ref 6–20)
CHLORIDE: 104 mmol/L (ref 101–111)
CO2: 28 mmol/L (ref 22–32)
Calcium: 8.8 mg/dL — ABNORMAL LOW (ref 8.9–10.3)
Creatinine, Ser: 0.62 mg/dL (ref 0.44–1.00)
GFR calc Af Amer: >60 mL/min (ref >60)
GFR calc non Af Amer: >60 mL/min (ref >60)
GLUCOSE: 94 mg/dL (ref 65–99)
POTASSIUM: 4.7 mmol/L (ref 3.5–5.1)
SODIUM: 137 mmol/L (ref 135–145)

## 2015-10-27 LAB — GLUCOSE, CAPILLARY
GLUCOSE-CAPILLARY: 110 mg/dL — AB (ref 65–99)
GLUCOSE-CAPILLARY: 85 mg/dL (ref 65–99)

## 2015-10-27 LAB — T4, FREE: FREE T4: 0.82 ng/dL (ref 0.61–1.12)

## 2015-10-27 LAB — MAGNESIUM: Magnesium: 1.5 mg/dL — ABNORMAL LOW (ref 1.7–2.4)

## 2015-10-27 MED ORDER — ANGIOPLASTY BOOK
Freq: Once | Status: AC
  Administered 2015-10-27: 01:00:00
  Filled 2015-10-27: qty 1

## 2015-10-27 MED ORDER — HEART ATTACK BOUNCING BOOK
Freq: Once | Status: AC
  Administered 2015-10-27: 01:00:00
  Filled 2015-10-27: qty 1

## 2015-10-27 MED ORDER — MAGNESIUM SULFATE 2 GM/50ML IV SOLN
2.0000 g | Freq: Once | INTRAVENOUS | Status: AC
  Administered 2015-10-27: 10:00:00 2 g via INTRAVENOUS
  Filled 2015-10-27: qty 50

## 2015-10-27 MED ORDER — HEPARIN SOD (PORK) LOCK FLUSH 100 UNIT/ML IV SOLN
500.0000 [IU] | INTRAVENOUS | Status: AC | PRN
  Administered 2015-10-27: 500 [IU]

## 2015-10-27 MED ORDER — CARVEDILOL 3.125 MG PO TABS: 1.5600 mg | ORAL_TABLET | Freq: Two times a day (BID) | ORAL | Status: DC

## 2015-10-27 MED ORDER — LISINOPRIL 2.5 MG PO TABS: 2.5000 mg | ORAL_TABLET | Freq: Every day | ORAL | 0 refills | Status: DC

## 2015-10-27 MED ORDER — CARVEDILOL 3.125 MG PO TABS: 1.5600 mg | ORAL_TABLET | Freq: Two times a day (BID) | ORAL | 0 refills | Status: DC

## 2015-10-27 MED ORDER — NITROGLYCERIN 0.4 MG SL SUBL: 0.4000 mg | SUBLINGUAL_TABLET | SUBLINGUAL | 0 refills | Status: DC | PRN

## 2015-10-27 MED ORDER — ATORVASTATIN CALCIUM 40 MG PO TABS: 40.0000 mg | ORAL_TABLET | Freq: Every day | ORAL | 0 refills | Status: DC

## 2015-10-27 MED ORDER — PANTOPRAZOLE SODIUM 40 MG PO TBEC: 40.0000 mg | DELAYED_RELEASE_TABLET | Freq: Every day | ORAL | 0 refills | Status: DC

## 2015-10-27 MED ORDER — CLOPIDOGREL BISULFATE 75 MG PO TABS: 75.0000 mg | ORAL_TABLET | Freq: Every day | ORAL | 0 refills | Status: DC

## 2015-10-27 MED ORDER — ASPIRIN 81 MG PO CHEW: 81.0000 mg | CHEWABLE_TABLET | Freq: Every day | ORAL | 0 refills | Status: DC

## 2015-10-27 NOTE — Discharge Summary (Signed)
Discharge Summary  [name] [id number] DOB: [birth year]  PCP: London Pepper, MD  Admit date: 10/25/2015 Discharge date: 10/27/2015  Time spent: <40mins  Recommendations for Outpatient Follow-up:  1. F/u with PMD within a week  for hospital discharge follow up, repeat cbc/bmp at follow up 2. F/u with oncology 3. F/u with cardiology  Discharge Diagnoses:  Active Hospital Problems   Diagnosis Date Noted  . Pain in the chest 10/25/2015  . NSTEMI (non-ST elevated myocardial infarction) ([place])   . Pancytopenia ([place]) 10/25/2015  . Elevated troponin 10/25/2015  . Leukocytosis 10/25/2015  . Malignant neoplasm of female breast ([place]) 09/19/2012  . Bipolar disorder ([place])   . Anxiety   . Obstructive sleep apnea 07/21/2007  . Hyperlipidemia 07/07/2007  . Hypothyroidism 10/01/2006    Resolved Hospital Problems   Diagnosis Date Noted Date Resolved  No resolved problems to display.    Discharge Condition: stable  Diet recommendation: heart healthy  Filed Weights   10/25/15 2112 10/26/15 0440 10/27/15 0530  Weight: 75.8 kg (167 lb) 75.3 kg (166 lb) 73.9 kg (162 lb 14.7 oz)    History of present illness:  Patient coming from: Home.  Chief Complaint: Chest pain.  HPI: [name] is a 60 y.o. female with medical history significant of ADD, seasonal allergies, anxiety, bipolar disorder, breast cancer on maintenance chemotherapy, depression, hyperlipidemia, hypothyroidism, mild sleep apnea not on CPAP, osteopenia who came to the emergency department with complaints of chest pain.  Per patient, she has been having on and off chest pain since May this year. She was evaluated by Dr. Dorris Carnes (cardiology), but not sure about the testing that was done. Today in the afternoon, patient had a recurrence of the pain and decided to come to the emergency department. She is states that the pain is dull and aching, associated with occasional diaphoresis, but denies dyspnea,  palpitations, nausea, emesis, PND, orthopnea or pitting edema of the lower extremities. She states that it improves when she is lying down.  ED Course: The patient received sublingual nitroglycerin and is currently chest pain-free. EKG showed borderline T-wave abnormalities and PVC. Troponin level was 0.41 pg/mL.  Hospital Course:  Principal Problem:   Pain in the chest Active Problems:   Hypothyroidism   Hyperlipidemia   Obstructive sleep apnea   Anxiety   Bipolar disorder (H[facility])   Malignant neoplasm of female breast (H[facility])   Pancytopenia (H[facility])   Elevated troponin   Leukocytosis   NSTEMI (non-ST elevated myocardial infarction) [facility])    Chest pain Elevated troponin Cardiology consulted, cath on 9/6 with DEs placement, on asa/plavix/statin/coreg,she is cleared to be discharge home by cardiology, she is to follow up with cardiology on 9/15.  omeprazole changed  to protonix due to possible interactive between omeprazole with plavix  Active Problems: Hypothyroidism Continue levothyroxine 100 g by mouth daily. TSH level 5.3  Hyperlipidemia Continue statin,  lifestyle modifications.  fasting lipids ldl 91, triglycerides 162.  Obstructive sleep apnea Not on CPAP. Supplemental oxygen at bedtime.  Anxiety Continue alprazolam. Continue lorazepam as needed.  Bipolar disorder ([place]) Stable at this time. Continue Lamictal 100 mg by mouth twice a day. Depakote 500 mg daily in the morning. Depakote 750 mg by mouth at bedtime.   Malignant neoplasm of female breast [facility]) Getting chemotherapy therapy , last dose of carboplatin on 9/1, defer management to oncology.  Pancytopenia ([place]) The patient is usually pancytopenic, but received Neulasta on 9/2. All three lines trending down  Slowing , likely  from chemo. Need close outpatient follow up.  Leukocytosis The patient received Neulasta last week after chemotherapy. She is afebrile. Wbc wnl on 9/7, need  to continue outpatient follow up and repeat cbc.     DVT prophylaxis:SCDs.. Code Status:Full code. Family Communication:Her husband,[name] Bomkamp in room. Disposition Plan:home with cardiology clearance Consults called:cardiology    Procedures:  Left heart cath  With DES to distal lad on 9/6  Antibiotics:  none   Discharge Exam: BP (!) 108/57 (BP Location: Right Arm)   Pulse (!) 45   Temp 97.9 F (36.6 C) (Oral)   Resp 14   Ht 5\' 2"  (1.575 m)   Wt 73.9 kg (162 lb 14.7 oz)   SpO2 97%   BMI 29.80 kg/m     General:  NAD  Cardiovascular: RRR  Respiratory: CTABL  Abdomen: Soft/ND/NT, positive BS  Musculoskeletal: No Edema  Neuro: aaox3   Discharge Instructions You were cared for by a hospitalist during your hospital stay. If you have any questions about your discharge medications or the care you received while you were in the hospital after you are discharged, you can call the unit and asked to speak with the hospitalist on call if the hospitalist that took care of you is not available. Once you are discharged, your primary care physician will handle any further medical issues. Please note that NO REFILLS for any discharge medications will be authorized once you are discharged, as it is imperative that you return to your primary care physician (or establish a relationship with a primary care physician if you do not have one) for your aftercare needs so that they can reassess your need for medications and monitor your lab values.  Discharge Instructions    AMB Referral to Cardiac Rehabilitation - Phase II    Complete by:  As directed   Diagnosis:   NSTEMI PTCA     Amb Referral to Cardiac Rehabilitation    Complete by:  As directed   Diagnosis:   NSTEMI Coronary Stents     Diet - low sodium heart healthy    Complete by:  As directed   Low fat   Increase activity slowly    Complete by:  As directed       Medication List    STOP taking these  medications   omeprazole 40 MG capsule Commonly known as:  PRILOSEC     TAKE these medications   ACETYL L-CARNITINE PO Take 2,000 mg by mouth at bedtime.   ALPRAZolam 0.5 MG tablet Commonly known as:  XANAX Take 1 tablet (0.5 mg total) by mouth at bedtime as needed for anxiety (1-2 tabs @@ hs and 1 tab day of chemotherapy treatments). What changed:  how much to take  when to take this   aspirin 81 MG chewable tablet Chew 1 tablet (81 mg total) by mouth daily.   atorvastatin 40 MG tablet Commonly known as:  LIPITOR Take 1 tablet (40 mg total) by mouth daily at 6 PM.   carvedilol 3.125 MG tablet Commonly known as:  COREG Take 0.5 tablets (1.56 mg total) by mouth 2 (two) times daily with a meal.   clopidogrel 75 MG tablet Commonly known as:  PLAVIX Take 1 tablet (75 mg total) by mouth daily with breakfast.   DEPAKOTE 250 MG DR tablet Generic drug:  divalproex Take 500-750 mg by mouth 2 (two) times daily. Take 500mg s in the morning and 750mg s at night   fluticasone 50 MCG/ACT nasal spray Commonly  known as:  FLONASE INHALE 2 SPRAYS INTO EACH NOSTRIL EVERY NIGHT   gabapentin 100 MG capsule Commonly known as:  NEURONTIN Take 1 capsule (100 mg total) by mouth 3 (three) times daily.   lamoTRIgine 100 MG tablet Commonly known as:  LAMICTAL Take 100 mg by mouth 2 (two) times daily.   levothyroxine 100 MCG tablet Commonly known as:  SYNTHROID, LEVOTHROID Take 100 mcg by mouth daily before breakfast.   lidocaine-prilocaine cream Commonly known as:  EMLA Apply topically as needed. Apply to port with every chemotherapy.   lisinopril 2.5 MG tablet Commonly known as:  PRINIVIL,ZESTRIL Take 1 tablet (2.5 mg total) by mouth daily.   loperamide 2 MG tablet Commonly known as:  IMODIUM A-D Take 2 mg by mouth as needed for diarrhea or loose stools.   LORazepam 1 MG tablet Commonly known as:  ATIVAN Take 1 tablet (1 mg total) by mouth every 6 (six) hours as needed for  anxiety.   nitroGLYCERIN 0.4 MG SL tablet Commonly known as:  NITROSTAT Place 1 tablet (0.4 mg total) under the tongue every 5 (five) minutes as needed for chest pain.   ondansetron 8 MG tablet Commonly known as:  ZOFRAN TAKE 1 TABLET BY MOUTH EVERY 8 HOURS AS NEEDED FOR NAUSEA AND VOMITING   pantoprazole 40 MG tablet Commonly known as:  PROTONIX Take 1 tablet (40 mg total) by mouth daily.   prochlorperazine 10 MG tablet Commonly known as:  COMPAZINE Take 1 tablet (10 mg total) by mouth every 6 (six) hours as needed for nausea or vomiting.   rOPINIRole 1 MG tablet Commonly known as:  REQUIP Take 1 mg by mouth at bedtime.   rOPINIRole 0.5 MG tablet Commonly known as:  REQUIP Take 0.5 mg by mouth at bedtime. Take 1 to 3 hours before bedtime      Allergies  Allergen Reactions  . Doxil [Doxorubicin Hcl Liposomal] Anaphylaxis    1st Doxil.   . Pollen Extract Other (See Comments)    Pollen and grass causes a lot sneezing   Follow-up Information    Richardson Dopp, PA-C .   Specialties:  Cardiology, Physician Assistant Why:  [facility] - 11/04/15 at 10:30am - Nicki Reaper is one of the PAs that works with Dr. Harrington Challenger. Contact information: [id number] [address] [phone]        London Pepper, MD Follow up in 1 week(s).   Specialty:  Family Medicine Why:  hospital discharge follow up Contact information: [address] [place] [address] [phone]        [facility], MD Follow up on 11/10/2015.   Specialty:  Oncology Contact information: [place]. [place] [address] [phone]            The results of significant diagnostics from this hospitalization (including imaging, microbiology, ancillary and laboratory) are listed below for reference.    Significant Diagnostic Studies: Dg Chest 2 View  Result Date: 10/25/2015 CLINICAL DATA:  LEFT chest pain and LEFT breast pain for a few days. History of  metastatic ovarian cancer. EXAM: CHEST  2 VIEW COMPARISON:  CT chest September 29, 2015 FINDINGS: Cardiomediastinal silhouette is normal. A few scattered sub cm pulmonary nodules suspected though, none were present on prior CT chest. No pleural effusion or focal consolidation. No pneumothorax. Surgical clips project in RIGHT chest wall, status post RIGHT mastectomy. Single lumen RIGHT chest Port-A-Cath with distal tip projecting cavoatrial junction. No pneumothorax. Soft tissue  planes and included osseous structures are nonsuspicious. IMPRESSION: A few suspected pulmonary nodules though, these were not present on prior CT scan, an could be artifact given interval apparent change. Recommend close attention on follow-up CT chest. Electronically Signed   By: Elon Alas M.D.   On: 10/25/2015 16:28   Ct Chest W Contrast  Result Date: 09/29/2015 CLINICAL DATA:  59 year old female with history of ovarian cancer with metastatic disease to the omentum originally diagnosed in 2014. Additional history of breast cancer status post right-sided lumpectomy. Undergoing ongoing chemotherapy. Known metastatic disease to the omentum. EXAM: CT CHEST, ABDOMEN, AND PELVIS WITH CONTRAST TECHNIQUE: Multidetector CT imaging of the chest, abdomen and pelvis was performed following the standard protocol during bolus administration of intravenous contrast. CONTRAST:  167mL ISOVUE-300 IOPAMIDOL (ISOVUE-300) INJECTION 61% COMPARISON:  CT of the chest, abdomen and pelvis 05/05/2015. FINDINGS: CT CHEST FINDINGS Cardiovascular: Heart size is normal. There is no significant pericardial fluid, thickening or pericardial calcification. There is aortic atherosclerosis, as well as atherosclerosis of the great vessels of the mediastinum and the coronary arteries, including calcified atherosclerotic plaque in the left main coronary artery. Right internal jugular single-lumen porta cath with tip terminating in the distal superior vena cava.  Mediastinum/Nodes: No pathologically enlarged mediastinal, hilar or internal mammary lymph nodes. Esophagus is unremarkable in appearance. No axillary lymphadenopathy. Lungs/Pleura: No suspicious appearing pulmonary nodules or masses. No acute consolidative airspace disease. No pleural effusions. Musculoskeletal: Status post right modified radical mastectomy and right axillary lymph node dissection. There are no aggressive appearing lytic or blastic lesions noted in the visualized portions of the skeleton. CT ABDOMEN PELVIS FINDINGS Hepatobiliary: Liver has a shrunken appearance and nodular contour, suggestive of early cirrhosis. No definite cystic or solid hepatic lesions are noted. No intra or extrahepatic biliary ductal dilatation. Gallbladder is normal in appearance. Pancreas: No pancreatic mass. No pancreatic ductal dilatation. No pancreatic or peripancreatic fluid or inflammatory changes. Spleen: Spleen is mildly enlarged measuring 13.0 x 5.2 x 9.8 cm (estimated splenic volume of 336 mL). Adrenals/Urinary Tract: The appearance of the kidneys and adrenal glands is normal bilaterally. No hydroureteronephrosis. Urinary bladder is nearly decompressed, but otherwise unremarkable in appearance. Stomach/Bowel: Normal appearance of the stomach. No pathologic dilatation of small bowel or colon. Appendix is not confidently identified may be surgically absent. Vascular/Lymphatic: Aortic atherosclerosis, without evidence of aneurysm or dissection in the abdominal or pelvic vasculature. Recannulized paraumbilical vein. Portal vein is dilated measuring up to 16 mm in diameter. No lymphadenopathy noted in the abdomen or pelvis. Reproductive: Uterus and ovaries are unremarkable in appearance. Other: Bulky intraperitoneal metastatic disease is again noted. The largest of these metastatic lesions are associated with the omentum, and appear generally stable compared to the prior examination. The previously referenced lesion in  the anterior aspect of the abdomen slightly to the left of midline currently measures 11.9 x 2.5 cm (image 81 of series 2). The largest lesion in the right subhepatic region currently measures 8.6 x 4.0 cm (image 74 of series 2). Several other smaller lesions are also noted, some of which appear smaller than the prior examination, while others appear larger. Overall burden of disease is very similar to the prior study. Trace volume of ascites, presumably malignant. No pneumoperitoneum. Musculoskeletal: There are no aggressive appearing lytic or blastic lesions noted in the visualized portions of the skeleton. IMPRESSION: 1. Overall, the widespread intraperitoneal metastatic disease appears essentially stable compared to the prior examination, as discussed above. 2. No new metastatic disease in the  thorax or in the solid or hollow viscera of the abdomen and pelvis. 3. Stigmata of cirrhosis and portal hypertension redemonstrated, including mild splenomegaly. 4. Additional incidental findings, as above. Electronically Signed   By: Vinnie Langton M.D.   On: 09/29/2015 15:38   Ct Angio Chest Pe W And/or Wo Contrast  Result Date: 10/25/2015 CLINICAL DATA:  Chest and left arm discomfort intermittently for the past 3 months, worsening today. History of breast cancer. Last chemotherapy was 4 days ago. EXAM: CT ANGIOGRAPHY CHEST WITH CONTRAST TECHNIQUE: Multidetector CT imaging of the chest was performed using the standard protocol during bolus administration of intravenous contrast. Multiplanar CT image reconstructions and MIPs were obtained to evaluate the vascular anatomy. CONTRAST:  100 cc Isovue 370 COMPARISON:  Chest CT from 09/29/2015 FINDINGS: Cardiovascular: No filling defect is identified in the pulmonary arterial tree to suggest pulmonary embolus. Coronary, aortic arch, and branch vessel atherosclerotic vascular disease. No acute aortic findings identified. Mediastinum/Nodes: No pathologic adenopathy  identified. Port-A-Cath tip at the cavoatrial junction. Lungs/Pleura: No significant findings. Upper Abdomen: Cirrhosis. The area of the known omental/peritoneal metastatic disease is not included on today' s CT chest. Musculoskeletal: Right mastectomy. Chronic lucencies in the sternal manubrium, not previously hypermetabolic, probably hemangiomas. Review of the MIP images confirms the above findings. IMPRESSION: 1. No embolus or acute vascular findings identified in the chest. 2. Cirrhosis. Electronically Signed   By: Van Clines M.D.   On: 10/25/2015 19:06   Ct Abdomen Pelvis W Contrast  Result Date: 09/29/2015 CLINICAL DATA:  60 year old female with history of ovarian cancer with metastatic disease to the omentum originally diagnosed in 2014. Additional history of breast cancer status post right-sided lumpectomy. Undergoing ongoing chemotherapy. Known metastatic disease to the omentum. EXAM: CT CHEST, ABDOMEN, AND PELVIS WITH CONTRAST TECHNIQUE: Multidetector CT imaging of the chest, abdomen and pelvis was performed following the standard protocol during bolus administration of intravenous contrast. CONTRAST:  136mL ISOVUE-300 IOPAMIDOL (ISOVUE-300) INJECTION 61% COMPARISON:  CT of the chest, abdomen and pelvis 05/05/2015. FINDINGS: CT CHEST FINDINGS Cardiovascular: Heart size is normal. There is no significant pericardial fluid, thickening or pericardial calcification. There is aortic atherosclerosis, as well as atherosclerosis of the great vessels of the mediastinum and the coronary arteries, including calcified atherosclerotic plaque in the left main coronary artery. Right internal jugular single-lumen porta cath with tip terminating in the distal superior vena cava. Mediastinum/Nodes: No pathologically enlarged mediastinal, hilar or internal mammary lymph nodes. Esophagus is unremarkable in appearance. No axillary lymphadenopathy. Lungs/Pleura: No suspicious appearing pulmonary nodules or masses.  No acute consolidative airspace disease. No pleural effusions. Musculoskeletal: Status post right modified radical mastectomy and right axillary lymph node dissection. There are no aggressive appearing lytic or blastic lesions noted in the visualized portions of the skeleton. CT ABDOMEN PELVIS FINDINGS Hepatobiliary: Liver has a shrunken appearance and nodular contour, suggestive of early cirrhosis. No definite cystic or solid hepatic lesions are noted. No intra or extrahepatic biliary ductal dilatation. Gallbladder is normal in appearance. Pancreas: No pancreatic mass. No pancreatic ductal dilatation. No pancreatic or peripancreatic fluid or inflammatory changes. Spleen: Spleen is mildly enlarged measuring 13.0 x 5.2 x 9.8 cm (estimated splenic volume of 336 mL). Adrenals/Urinary Tract: The appearance of the kidneys and adrenal glands is normal bilaterally. No hydroureteronephrosis. Urinary bladder is nearly decompressed, but otherwise unremarkable in appearance. Stomach/Bowel: Normal appearance of the stomach. No pathologic dilatation of small bowel or colon. Appendix is not confidently identified may be surgically absent. Vascular/Lymphatic: Aortic atherosclerosis, without  evidence of aneurysm or dissection in the abdominal or pelvic vasculature. Recannulized paraumbilical vein. Portal vein is dilated measuring up to 16 mm in diameter. No lymphadenopathy noted in the abdomen or pelvis. Reproductive: Uterus and ovaries are unremarkable in appearance. Other: Bulky intraperitoneal metastatic disease is again noted. The largest of these metastatic lesions are associated with the omentum, and appear generally stable compared to the prior examination. The previously referenced lesion in the anterior aspect of the abdomen slightly to the left of midline currently measures 11.9 x 2.5 cm (image 81 of series 2). The largest lesion in the right subhepatic region currently measures 8.6 x 4.0 cm (image 74 of series 2).  Several other smaller lesions are also noted, some of which appear smaller than the prior examination, while others appear larger. Overall burden of disease is very similar to the prior study. Trace volume of ascites, presumably malignant. No pneumoperitoneum. Musculoskeletal: There are no aggressive appearing lytic or blastic lesions noted in the visualized portions of the skeleton. IMPRESSION: 1. Overall, the widespread intraperitoneal metastatic disease appears essentially stable compared to the prior examination, as discussed above. 2. No new metastatic disease in the thorax or in the solid or hollow viscera of the abdomen and pelvis. 3. Stigmata of cirrhosis and portal hypertension redemonstrated, including mild splenomegaly. 4. Additional incidental findings, as above. Electronically Signed   By: Vinnie Langton M.D.   On: 09/29/2015 15:38    Microbiology: No results found for this or any previous visit (from the past 240 hour(s)).   Labs: Basic Metabolic Panel:  Recent Labs Lab 10/21/15 1047 10/25/15 1649 10/26/15 0340 10/27/15 0430  NA 141 139 138 137  K 4.4 4.3 4.0 4.7  CL  --  103 102 104  CO2 26 30 31 28   GLUCOSE 85 116* 106* 94  BUN 11.6 14 14 12   CREATININE 0.7 0.59 0.58 0.62  CALCIUM 9.3 9.1 9.3 8.8*  MG  --   --   --  1.5*   Liver Function Tests:  Recent Labs Lab 10/21/15 1047 10/26/15 0340  AST 27 28  ALT 11 15  ALKPHOS 77 83  BILITOT <0.30 0.4  PROT 8.0 7.4  ALBUMIN 3.4* 3.3*   No results for input(s): LIPASE, AMYLASE in the last 168 hours. No results for input(s): AMMONIA in the last 168 hours. CBC:  Recent Labs Lab 10/21/15 1047 10/25/15 1649 10/26/15 0340 10/27/15 0430  WBC 2.4* 18.1* 13.0* 5.9  NEUTROABS 1.1*  --  10.4*  --   HGB 9.8* 10.1* 9.5* 8.5*  HCT 31.5* 32.9* 31.2* 29.6*  MCV 99.9 103.1* 102.6* 106.5*  PLT 83* 77* 74* 70*   Cardiac Enzymes:  Recent Labs Lab 10/25/15 2300 10/26/15 0340  TROPONINI 1.61* 1.10*   BNP: BNP (last  3 results) No results for input(s): BNP in the last 8760 hours.  ProBNP (last 3 results) No results for input(s): PROBNP in the last 8760 hours.  CBG:  Recent Labs Lab 10/26/15 1404 10/27/15 0801 10/27/15 1234  GLUCAP 86 110* 85       Signed:  Gaston Dase MD, PhD  [facility] 10/27/2015, 2:11 PM

## 2015-10-27 NOTE — Progress Notes (Signed)
  Echocardiogram 2D Echocardiogram has been performed.  [name] 10/27/2015, 9:58 AM

## 2015-10-27 NOTE — Telephone Encounter (Signed)
-----   Message from Candis Schatz sent at 10/27/2015 11:29 AM EDT ----- Has an appt to see Scott on 9/15. TOC NSTEMI  Thanks Trisha

## 2015-10-27 NOTE — Progress Notes (Signed)
  Echocardiogram 2D Echocardiogram has been performed.  [name] 10/27/2015, 9:59 AM

## 2015-10-27 NOTE — Progress Notes (Signed)
TOC f/u scheduled [facility] - 11/04/15 at 10:30am - Richardson Dopp PA-C is one of the PAs that works with Dr. Harrington Challenger. Dayna Dunn PA-C

## 2015-10-27 NOTE — Care Management Note (Addendum)
Case Management Note  Patient Details  Name: DELVA LLERA MRN: [id number] Date of Birth: 1955/03/07  Subjective/Objective:    Patient is s/p coronary intervention, NSTEMI, lives with spouse, pta indep, has pcp, has medication coverage thru insuance. And has transport at dc.  Will be on plavix.  No needs.                Action/Plan:   Expected Discharge Date:                  Expected Discharge Plan:  Home/Self Care  In-House Referral:     Discharge planning Services  CM Consult  Post Acute Care Choice:    Choice offered to:     DME Arranged:    DME Agency:     HH Arranged:    HH Agency:     Status of Service:  Completed, signed off  If discussed at [employer] of Stay Meetings, dates discussed:    Additional Comments:  Zenon Mayo, RN 10/27/2015, 11:35 AM

## 2015-10-27 NOTE — Progress Notes (Signed)
CARDIAC REHAB PHASE I   PRE:  Rate/Rhythm: 90 SR  BP:  Supine: 111/66  Sitting:   Standing:    SaO2:   MODE:  Ambulation: 260 ft   POST:  Rate/Rhythm: 102 ST  BP:  Supine:   Sitting: 118/64  Standing:    SaO2:  0810-0930 Pt walked 260 ft on RA with slow pace. No CP. Pt stated she has not been doing much walking for ex. Education completed with pt and husband who voiced understanding. Stressed importance of plavix with stent. Reviewed NTG use, MI restrictions, risk factors, modified ex ed and diet. Gave pt CHF booklet and reviewed when to call MD since EF low on cath. Encouraged low sodium of 2000 mg restriction. Gave modified ex ed as pt taking chemo and needs to progress slowly. Discussed CRP 2 and will refer to [place]. To recliner after walk. To bathroom twice.   Graylon Good, RN BSN  10/27/2015 9:24 AM

## 2015-10-27 NOTE — Progress Notes (Signed)
Subjective: No CP  No SOB  Tired   Objective: Vitals:   10/26/15 2000 10/27/15 0530 10/27/15 0803 10/27/15 0811  BP: 132/72 (!) 92/49 104/61   Pulse: 96 82 83   Resp: 19 14 19    Temp: 98.5 F (36.9 C) 97.9 F (36.6 C) 97.9 F (36.6 C)   TempSrc: Oral Oral Oral   SpO2: 96% 95% 93% 95%  Weight:  162 lb 14.7 oz (73.9 kg)    Height:       Weight change: -2 lb 1.3 oz (-0.943 kg)  Intake/Output Summary (Last 24 hours) at 10/27/15 0921 Last data filed at 10/26/15 2100  Gross per 24 hour  Intake              726 ml  Output              650 ml  Net               76 ml    General: Alert, awake, oriented x3, in no acute distress Neck:  JVP is normal Heart: Regular rate and rhythm, without murmurs, rubs, gallops.  Lungs: Clear to auscultation.  No rales or wheezes. Exemities:  No edema.   Neuro: Grossly intact, nonfocal.  Tele  SR    Lab Results: Results for orders placed or performed during the hospital encounter of 10/25/15 (from the past 24 hour(s))  POCT Activated clotting time     Status: None   Collection Time: 10/26/15  1:00 PM  Result Value Ref Range   Activated Clotting Time 224 seconds  Glucose, capillary     Status: None   Collection Time: 10/26/15  2:04 PM  Result Value Ref Range   Glucose-Capillary 86 65 - 99 mg/dL  Basic metabolic panel     Status: Abnormal   Collection Time: 10/27/15  4:30 AM  Result Value Ref Range   Sodium 137 135 - 145 mmol/L   Potassium 4.7 3.5 - 5.1 mmol/L   Chloride 104 101 - 111 mmol/L   CO2 28 22 - 32 mmol/L   Glucose, Bld 94 65 - 99 mg/dL   BUN 12 6 - 20 mg/dL   Creatinine, Ser 0.62 0.44 - 1.00 mg/dL   Calcium 8.8 (L) 8.9 - 10.3 mg/dL   GFR calc non Af Amer >60 >60 mL/min   GFR calc Af Amer >60 >60 mL/min   Anion gap 5 5 - 15  CBC     Status: Abnormal   Collection Time: 10/27/15  4:30 AM  Result Value Ref Range   WBC 5.9 4.0 - 10.5 K/uL   RBC 2.78 (L) 3.87 - 5.11 MIL/uL   Hemoglobin 8.5 (L) 12.0 - 15.0 g/dL   HCT 29.6  (L) 36.0 - 46.0 %   MCV 106.5 (H) 78.0 - 100.0 fL   MCH 30.6 26.0 - 34.0 pg   MCHC 28.7 (L) 30.0 - 36.0 g/dL   RDW 16.1 (H) 11.5 - 15.5 %   Platelets 70 (L) 150 - 400 K/uL  Magnesium     Status: Abnormal   Collection Time: 10/27/15  4:30 AM  Result Value Ref Range   Magnesium 1.5 (L) 1.7 - 2.4 mg/dL  T4, free     Status: None   Collection Time: 10/27/15  4:30 AM  Result Value Ref Range   Free T4 0.82 0.61 - 1.12 ng/dL  Glucose, capillary     Status: Abnormal   Collection Time: 10/27/15  8:01 AM  Result Value  Ref Range   Glucose-Capillary 110 (H) 65 - 99 mg/dL    Studies/Results: No results found.  Medications: Reviewed     @PROBHOSP @  1  NSTEMI  Pt had cath yesterday that showed LVEF 35 to 45%  99^ distal LAD Underwent MCI/DEs to this area.  Recomm DAPT fot 6 months to 1 year  Coreg aand Lisinopril added at very low dose  I have reviewed echo today  Anterior wall not fully imaged but overall LVEF appears OK  Changes yesterday reflect stunning    2  Oncology  Pt will follow up at [facility]   Would be good to follow counts closely with chemo and now ASA/PLAvix     OK to d/c home  WIll make sure she has f/u in clinic     LOS: 1 day   Dorris Carnes 10/27/2015, 9:21 AM

## 2015-10-28 ENCOUNTER — Telehealth: Payer: Self-pay | Admitting: *Deleted

## 2015-10-28 MED FILL — Famotidine in NaCl 0.9% IV Soln 20 MG/50ML: INTRAVENOUS | Qty: 50 | Status: AC

## 2015-10-28 NOTE — Telephone Encounter (Signed)
Received call from pt stating that she has been in the hospital & d/c yest with a slight heart attack.  She was told to call to schedule labs to be done before she sees cardiology PA which is the 15th.  Informed that she should call her PCP for labs & may need prescription from cardiology for what they want.  Per d/c note, cbc & BMP requested.  Discussed with Dixie RN/Shadad who is in agreement.  Pt may need f/u here also.  Message routed to Dr Corliss Parish RN

## 2015-10-28 NOTE — Telephone Encounter (Signed)
Patient contacted regarding discharge from [facility] on October 27, 2015.  Patient understands to follow up with provider Richardson Dopp, PA-C on November 04, 2015 at 10:30AM at [facility]. Patient understands discharge instructions? yes Patient understands medications and regiment? yes Patient understands to bring all medications to this visit? yes

## 2015-11-02 ENCOUNTER — Encounter: Payer: Self-pay | Admitting: Physician Assistant

## 2015-11-02 ENCOUNTER — Telehealth: Payer: Self-pay | Admitting: Internal Medicine

## 2015-11-02 DIAGNOSIS — I2510 Atherosclerotic heart disease of native coronary artery without angina pectoris: Secondary | ICD-10-CM | POA: Diagnosis not present

## 2015-11-02 DIAGNOSIS — D649 Anemia, unspecified: Secondary | ICD-10-CM | POA: Diagnosis not present

## 2015-11-02 DIAGNOSIS — C50912 Malignant neoplasm of unspecified site of left female breast: Secondary | ICD-10-CM | POA: Diagnosis not present

## 2015-11-02 DIAGNOSIS — Z09 Encounter for follow-up examination after completed treatment for conditions other than malignant neoplasm: Secondary | ICD-10-CM | POA: Diagnosis not present

## 2015-11-02 DIAGNOSIS — C482 Malignant neoplasm of peritoneum, unspecified: Secondary | ICD-10-CM | POA: Diagnosis not present

## 2015-11-02 DIAGNOSIS — E039 Hypothyroidism, unspecified: Secondary | ICD-10-CM | POA: Diagnosis not present

## 2015-11-02 DIAGNOSIS — I252 Old myocardial infarction: Secondary | ICD-10-CM | POA: Diagnosis not present

## 2015-11-02 NOTE — Telephone Encounter (Signed)
Follow up    Pt verbalized that she is returning call for triage rn

## 2015-11-02 NOTE — Telephone Encounter (Signed)
[name] Is calling because she had a heart attack last Tuesday and a stent was placed , but she is still feeling some chest pains and is wanting to know is that normal . She did take a nitro. Please call   Thanks

## 2015-11-02 NOTE — Telephone Encounter (Signed)
Left pt a message to call back. 

## 2015-11-02 NOTE — Telephone Encounter (Signed)
Pt had a stent placement last week. Pt was D/C from the hospital last Thursday September 7 th. Pt said that she has been having chest pressure it  radiates to her left arm since D/C. Pt denies SOB. This happened sporadically it is  Mild pain not as intense as prior the stent placement. Pt took a sublingual  NTG on Monday. Which it helped the pain. Pt is seen her PCP at 3:30 PM today. Pt also has an appointment with Richardson Dopp PA this Friday 11/04/15, at 10:45 AM. Pt is aware to call the office,or go to the ER if symptoms increased and she experience  any other symptoms.

## 2015-11-03 DIAGNOSIS — I2510 Atherosclerotic heart disease of native coronary artery without angina pectoris: Secondary | ICD-10-CM | POA: Insufficient documentation

## 2015-11-03 NOTE — Progress Notes (Addendum)
Cardiology Office Note:    Date:  11/04/2015   ID:  [name], DOB 1955-08-11, MRN [id number]  PCP:  London Pepper, MD  Cardiologist:  Dr. Dorris Carnes   Electrophysiologist:  n/a  Referring MD: London Pepper, MD   Chief Complaint  Patient presents with  . Hospitalization Follow-up    s/p NSTEMI   History of Present Illness:    [name] is a 60 y.o. female with a hx of Peritoneal CA invasive breast CA, diabetes, HL, sleep apnea.  Admitted 9/5-9/7 with non-STEMI. LHC demonstrated single vessel CAD with 99% distal LAD stenosis treated with Xience DES. EF was 35-45% at cardiac catheterization. Follow-up echo demonstrated normal LV function with any of [id number] and mild diastolic dysfunction.  She returns for FU.    She is here with her husband. She notes episodic chest discomfort since she was in the hospital. This is the same pain that brought her to the hospital. She notes with activity. She does have pain at rest at times. She does note improvement in her symptoms if she sits up. She denies significant dyspnea. She denies orthopnea, PND or edema. She denies syncope. She has taken nitroglycerin with relief of her chest symptoms.  Prior CV studies that were reviewed today include:    Echo 10/27/15 Mild concentric LVH, EF 60-65%, normal wall motion, grade 1 diastolic dysfunction, mild LAE, trivial TR, PASP 10 mmHg  LHC 10/26/15 LM ok LAD dist 99% LCx mid 30% RCA irregs; ost RPDA 40%, RPLB2 30% EF 35-45% PCI: 2.25 x 15 mm Xience Alpine DES to distal LAD 1. Severe one-vessel coronary artery disease affecting the distal LAD. Relatively small vessel post stenosis. 2. Moderately reduced LV systolic function with an ejection fraction of 35-40% with severe hypokinesis of the mid distal anterior wall and apical area. 3. Moderately elevated left ventricular end-diastolic pressure. 4. Successful angioplasty and drug-eluting stent placement to the distal LAD. Recommendations: Dual  antiplatelet therapy for one year or at least 6 months. I started small dose carvedilol. Initiate treatment with an ACE inhibitor before hospital discharge.  Chest CTA 10/25/15 IMPRESSION: 1. No embolus or acute vascular findings identified in the chest. 2. Cirrhosis.  Venous Duplex 10/25/15 Summary: No evidence of deep vein thrombosis involving the left upper extremity.  Past Medical History:  Diagnosis Date  . ADD (attention deficit disorder)   . Allergy    seasonal  . Anxiety   . Bipolar disorder ([place])   . Breast cancer ([place]) 1989   right breast  . Depression   . Diabetes mellitus    pt denies DM noe meds  . Hematochezia   . Hyperlipidemia   . Hypothyroidism   . Maintenance chemotherapy    Pt has chemo every 3 weeks (on Friday)  . Osteopenia 03/2009   t score -2.1 FRAX 4.6/0.4  . Premature menopause   . Sleep apnea    mild    Past Surgical History:  Procedure Laterality Date  . BREAST SURGERY  1989   RIGHT LUMPECTOMY, RADIATION AND CHEMO  . CARDIAC CATHETERIZATION N/A 10/26/2015   Procedure: Left Heart Cath and Coronary Angiography;  Surgeon: Wellington Hampshire, MD;  Location: [facility];  Service: Cardiovascular;  Laterality: N/A;  . CARDIAC CATHETERIZATION N/A 10/26/2015   Procedure: Coronary Stent Intervention;  Surgeon: Wellington Hampshire, MD;  Location: [facility];  Service: Cardiovascular;  Laterality: N/A;  . FOOT SURGERY  2013   BILATERAL   . HYSTEROSCOPY  2011   [place]  . PELVIC LAPAROSCOPY/ Hysteroscopy  1996    Current Medications: Outpatient Medications Prior to Visit  Medication Sig Dispense Refill  . Acetylcarnitine HCl (ACETYL L-CARNITINE PO) Take 2,000 mg by mouth at bedtime.    Marland Kitchen aspirin 81 MG chewable tablet Chew 1 tablet (81 mg total) by mouth daily. 30 tablet 0  . atorvastatin (LIPITOR) 40 MG tablet Take 1 tablet (40 mg total) by mouth daily at 6 PM. 30 tablet 0  . carvedilol (COREG) 3.125 MG tablet Take 0.5 tablets (1.56 mg total) by  mouth 2 (two) times daily with a meal. 30 tablet 0  . clopidogrel (PLAVIX) 75 MG tablet Take 1 tablet (75 mg total) by mouth daily with breakfast. 30 tablet 0  . divalproex (DEPAKOTE) 250 MG DR tablet Take 500-750 mg by mouth 2 (two) times daily. Take 500mg s in the morning and 750mg s at night    . fluticasone (FLONASE) 50 MCG/ACT nasal spray INHALE 2 SPRAYS INTO EACH NOSTRIL EVERY NIGHT  11  . gabapentin (NEURONTIN) 100 MG capsule Take 1 capsule (100 mg total) by mouth 3 (three) times daily. 90 capsule 1  . lamoTRIgine (LAMICTAL) 100 MG tablet Take 100 mg by mouth 2 (two) times daily.    Marland Kitchen levothyroxine (SYNTHROID, LEVOTHROID) 100 MCG tablet Take 100 mcg by mouth daily before breakfast.     . lidocaine-prilocaine (EMLA) cream Apply topically as needed. Apply to port with every chemotherapy. 30 g 0  . lisinopril (PRINIVIL,ZESTRIL) 2.5 MG tablet Take 1 tablet (2.5 mg total) by mouth daily. 30 tablet 0  . loperamide (IMODIUM A-D) 2 MG tablet Take 2 mg by mouth as needed for diarrhea or loose stools.    Marland Kitchen LORazepam (ATIVAN) 1 MG tablet Take 1 tablet (1 mg total) by mouth every 6 (six) hours as needed for anxiety. 30 tablet 0  . nitroGLYCERIN (NITROSTAT) 0.4 MG SL tablet Place 1 tablet (0.4 mg total) under the tongue every 5 (five) minutes as needed for chest pain. 30 tablet 0  . ondansetron (ZOFRAN) 8 MG tablet TAKE 1 TABLET BY MOUTH EVERY 8 HOURS AS NEEDED FOR NAUSEA AND VOMITING 20 tablet 1  . pantoprazole (PROTONIX) 40 MG tablet Take 1 tablet (40 mg total) by mouth daily. 30 tablet 0  . prochlorperazine (COMPAZINE) 10 MG tablet Take 1 tablet (10 mg total) by mouth every 6 (six) hours as needed for nausea or vomiting. 30 tablet 1  . rOPINIRole (REQUIP) 0.5 MG tablet Take 0.5-1 mg by mouth at bedtime. Take 1 to 3 hours before bedtime     . rOPINIRole (REQUIP) 1 MG tablet Take 1 mg by mouth at bedtime.     . ALPRAZolam (XANAX) 0.5 MG tablet Take 1 tablet (0.5 mg total) by mouth at bedtime as needed for  anxiety (1-2 tabs @@ hs and 1 tab day of chemotherapy treatments). (Patient taking differently: Take 0.5-1 mg by mouth 2 (two) times daily. ) 30 tablet 0   Facility-Administered Medications Prior to Visit  Medication Dose Route Frequency Provider Last Rate Last Dose  . heparin lock flush 100 unit/mL  500 Units Intravenous Once Wyatt Portela, MD      . sodium chloride flush (NS) 0.9 % injection 10 mL  10 mL Intravenous PRN Wyatt Portela, MD          Allergies:   Doxil [doxorubicin hcl liposomal] and Pollen extract   Social History   Social History  . Marital status: Married  Spouse name: [name]  . Number of children: 0  . Years of education: 14   Occupational History  . unemployed [employer]   Social History Main Topics  . Smoking status: Never Smoker  . Smokeless tobacco: Never Used  . Alcohol use 0.0 oz/week     Comment: very rarely - maybe 1 glass wine every 3 mos  . Drug use: No  . Sexual activity: No   Other Topics Concern  . None   Social History Narrative   Lives at home with husband.    Caffeine use:  Tea/soda occass     Family History:  The patient's family history includes Allergies in her father; Breast cancer in her maternal aunt and maternal aunt; Breast cancer (age of onset: 26) in her maternal aunt; Cancer in her cousin and cousin; Diabetes in her maternal grandmother; Heart Problems in her paternal grandfather; Heart disease in her maternal grandfather, maternal grandmother, and mother; Hypertension in her paternal grandfather; Liver cancer (age of onset: 10) in her cousin; Other in her mother.   ROS:   Please see the history of present illness.    Review of Systems  Cardiovascular: Positive for chest pain.   All other systems reviewed and are negative.   EKGs/Labs/Other Test Reviewed:    EKG:  EKG is  ordered today.  The ekg ordered today demonstrates NSR, HR 73, normal axis, T-wave inversions 1, 2, aVL, V3-V6, QTc 436 ms, similar to  tracing dated 10/27/15  Recent Labs: 10/26/2015: ALT 15; TSH 5.305 10/27/2015: BUN 12; Creatinine, Ser 0.62; Hemoglobin 8.5; Magnesium 1.5; Platelets 70; Potassium 4.7; Sodium 137   Recent Lipid Panel    Component Value Date/Time   CHOL 161 10/26/2015 0340   TRIG 162 (H) 10/26/2015 0340   HDL 38 (L) 10/26/2015 0340   CHOLHDL 4.2 10/26/2015 0340   VLDL 32 10/26/2015 0340   LDLCALC 91 10/26/2015 0340   LDLDIRECT 151.3 08/24/2009 0803     Physical Exam:    VS:  BP 118/74   Pulse 73   Ht 5\' 2"  (1.575 m)   Wt 170 lb 6.4 oz (77.3 kg)   BMI 31.17 kg/m     Wt Readings from Last 3 Encounters:  11/04/15 170 lb 6.4 oz (77.3 kg)  10/27/15 162 lb 14.7 oz (73.9 kg)  10/21/15 165 lb (74.8 kg)     Physical Exam  Constitutional: She is oriented to person, place, and time. She appears well-developed and well-nourished. No distress.  HENT:  Head: Normocephalic and atraumatic.  Eyes: No scleral icterus.  Neck: Normal range of motion. No JVD present.  Cardiovascular: Normal rate, regular rhythm, S1 normal and S2 normal.  Exam reveals no friction rub.   No murmur heard. Pulmonary/Chest: Breath sounds normal. She has no wheezes. She has no rhonchi. She has no rales.  Abdominal: Soft. There is no tenderness.  Musculoskeletal: She exhibits no edema.  R wrist with ecchymosis but no hematoma  Neurological: She is alert and oriented to person, place, and time.  Skin: Skin is warm and dry.  Psychiatric: She has a normal mood and affect.    ASSESSMENT:    1. Coronary artery disease involving native coronary artery of native heart with other form of angina pectoris ([place])   2. NSTEMI (non-ST elevated myocardial infarction) ([place])   3. Hyperlipidemia   4. Primary peritoneal carcinomatosis ([place])   5. Thrombocytopenia ([place])    PLAN:    In order of problems listed above:  1. CAD - She presents for follow-up post-hospitalization with complaints of chest discomfort consistent with angina. Her  symptoms never resolved after PCI. Her ECG remains unchanged. Her symptoms have been severe at times and she's required nitroglycerin with relief. I reviewed her case today with Dr. Angelena Form (DOD). We recommend proceeding with re-look cardiac catheterization to better assess her symptoms. Question if her symptoms may be related to spasm as well. They do not sound gastrointestinal in nature.  Risks and benefits of cardiac catheterization have been discussed with the patient.  These include bleeding, infection, kidney damage, stroke, heart attack, death.  The patient understands these risks and is willing to proceed.   -  Arrange left heart catheterization  -  Add isosorbide 15 mg daily  -  Continue aspirin, statin, beta blocker, Plavix, ACE inhibitor.  -  She just had CBC, BMET done with PCP 2 days ago - will request report  2. S/p NSTEMI - As noted, she continues to have chest discomfort consistent with angina. Proceed with cardiac catheterization as noted. Continue aspirin, Plavix, statin, beta blocker, ACE inhibitor. EF was preserved by echocardiogram post catheterization. Eventually try to set her up with cardiac rehabilitation.  3. HL - continue statin.  4. CA - Continue follow-up with oncology. She undergoes another round of chemotherapy next Thursday.  5. Thrombocytopenia - This has been chronic and stable for quite some time. Will need to keep a close eye on this with the need to be on dual antiplatelet therapy for a minimum of 12 months.   Medication Adjustments/Labs and Tests Ordered: Current medicines are reviewed at length with the patient today.  Concerns regarding medicines are outlined above.  Medication changes, Labs and Tests ordered today are outlined in the Patient Instructions noted below. Patient Instructions  Medication Instructions:  START IMDUR 15 MG DAILY; THIS WILL BE 1/2 TABLET OF THE 30 MG TABLET  Labwork: NONE  Testing/Procedures: Your physician has requested that  you have an echocardiogram. Echocardiography is a painless test that uses sound waves to create images of your heart. It provides your doctor with information about the size and shape of your heart and how well your heart's chambers and valves are working. This procedure takes approximately one hour. There are no restrictions for this procedure.  Follow-Up: DR. Harrington Challenger 2 WEEKS POST APPT NEEDED Any Other Special Instructions Will Be Listed Below (If Applicable). If you need a refill on your cardiac medications before your next appointment, please call your pharmacy.   Signed, Richardson Dopp, PA-C  11/04/2015 2:28 PM    [facility] [address], [place], [place]  [address] Phone: [phone]; Fax: [phone]   I discussed this patient with Richardson Dopp, PA-C. I did not examine her today. I agree with his plan.   Lauree Chandler 11/04/2015 3:18 PM

## 2015-11-04 ENCOUNTER — Ambulatory Visit (INDEPENDENT_AMBULATORY_CARE_PROVIDER_SITE_OTHER): Payer: PPO | Admitting: Physician Assistant

## 2015-11-04 ENCOUNTER — Encounter: Payer: Self-pay | Admitting: Physician Assistant

## 2015-11-04 ENCOUNTER — Encounter: Payer: Self-pay | Admitting: *Deleted

## 2015-11-04 VITALS — BP 118/74 | HR 73 | Ht 62.0 in | Wt 170.4 lb

## 2015-11-04 DIAGNOSIS — I25118 Atherosclerotic heart disease of native coronary artery with other forms of angina pectoris: Secondary | ICD-10-CM

## 2015-11-04 DIAGNOSIS — C482 Malignant neoplasm of peritoneum, unspecified: Secondary | ICD-10-CM

## 2015-11-04 DIAGNOSIS — I214 Non-ST elevation (NSTEMI) myocardial infarction: Secondary | ICD-10-CM | POA: Diagnosis not present

## 2015-11-04 DIAGNOSIS — E785 Hyperlipidemia, unspecified: Secondary | ICD-10-CM

## 2015-11-04 DIAGNOSIS — D696 Thrombocytopenia, unspecified: Secondary | ICD-10-CM

## 2015-11-04 MED ORDER — ISOSORBIDE MONONITRATE ER 30 MG PO TB24: 30.0000 mg | ORAL_TABLET | Freq: Every day | ORAL | 3 refills | Status: DC

## 2015-11-04 NOTE — Progress Notes (Signed)
Pt having cath 11/08/15. Per Richardson Dopp, PAC I called and PCP Dr. Orland Mustard to obtain pt's recent BMET, CBC that were done with PCP. I have lmom for PCP med rec dept to fax over recent lab work to [place], [place]

## 2015-11-04 NOTE — Patient Instructions (Addendum)
Medication Instructions:  START IMDUR 15 MG DAILY; THIS WILL BE 1/2 TABLET OF THE 30 MG TABLET  Labwork: NONE  Testing/Procedures: Your physician has requested that you have an echocardiogram. Echocardiography is a painless test that uses sound waves to create images of your heart. It provides your doctor with information about the size and shape of your heart and how well your heart's chambers and valves are working. This procedure takes approximately one hour. There are no restrictions for this procedure.  Follow-Up: DR. Harrington Challenger 2 WEEKS POST APPT NEEDED Any Other Special Instructions Will Be Listed Below (If Applicable). If you need a refill on your cardiac medications before your next appointment, please call your pharmacy.

## 2015-11-07 ENCOUNTER — Telehealth: Payer: Self-pay | Admitting: Cardiology

## 2015-11-07 ENCOUNTER — Ambulatory Visit: Payer: PPO | Admitting: Gynecology

## 2015-11-07 ENCOUNTER — Encounter: Payer: Self-pay | Admitting: *Deleted

## 2015-11-07 NOTE — Telephone Encounter (Signed)
New message    Pt is having a procedure done tomorrow and is unsure of what she is to do in order to prep for the procedure. Please call.

## 2015-11-07 NOTE — Telephone Encounter (Signed)
Patient wanted clarification on instruction for left and right heart cath. Patient wanted to know if she was only to take aspirin and plavix  as instruction and not take any other morning medication as instruction.  RN reviewed letter that was given.RN informed patient to follow the written  Direction- take only aspirin and plavix Patient verbalized understanding.

## 2015-11-08 ENCOUNTER — Encounter (HOSPITAL_COMMUNITY)
Admission: RE | Disposition: A | Discharge status: Home / Self Care | Payer: Self-pay | Source: Ambulatory Visit | Attending: Cardiology

## 2015-11-08 ENCOUNTER — Telehealth: Payer: Self-pay | Admitting: *Deleted

## 2015-11-08 ENCOUNTER — Ambulatory Visit (HOSPITAL_COMMUNITY)
Admission: RE | Admit: 2015-11-08 | Discharge: 2015-11-08 | Disposition: A | Discharge status: Home / Self Care | Payer: PPO | Source: Ambulatory Visit | Attending: Cardiology | Admitting: Cardiology

## 2015-11-08 ENCOUNTER — Encounter (HOSPITAL_COMMUNITY): Payer: Self-pay | Admitting: Internal Medicine

## 2015-11-08 DIAGNOSIS — Z7902 Long term (current) use of antithrombotics/antiplatelets: Secondary | ICD-10-CM | POA: Diagnosis not present

## 2015-11-08 DIAGNOSIS — Z955 Presence of coronary angioplasty implant and graft: Secondary | ICD-10-CM | POA: Insufficient documentation

## 2015-11-08 DIAGNOSIS — Z9221 Personal history of antineoplastic chemotherapy: Secondary | ICD-10-CM | POA: Insufficient documentation

## 2015-11-08 DIAGNOSIS — I25118 Atherosclerotic heart disease of native coronary artery with other forms of angina pectoris: Secondary | ICD-10-CM | POA: Insufficient documentation

## 2015-11-08 DIAGNOSIS — M8580 Other specified disorders of bone density and structure, unspecified site: Secondary | ICD-10-CM | POA: Insufficient documentation

## 2015-11-08 DIAGNOSIS — Z8249 Family history of ischemic heart disease and other diseases of the circulatory system: Secondary | ICD-10-CM | POA: Insufficient documentation

## 2015-11-08 DIAGNOSIS — D696 Thrombocytopenia, unspecified: Secondary | ICD-10-CM | POA: Insufficient documentation

## 2015-11-08 DIAGNOSIS — Z803 Family history of malignant neoplasm of breast: Secondary | ICD-10-CM | POA: Insufficient documentation

## 2015-11-08 DIAGNOSIS — E785 Hyperlipidemia, unspecified: Secondary | ICD-10-CM | POA: Diagnosis not present

## 2015-11-08 DIAGNOSIS — Z7982 Long term (current) use of aspirin: Secondary | ICD-10-CM | POA: Diagnosis not present

## 2015-11-08 DIAGNOSIS — E119 Type 2 diabetes mellitus without complications: Secondary | ICD-10-CM | POA: Insufficient documentation

## 2015-11-08 DIAGNOSIS — G4730 Sleep apnea, unspecified: Secondary | ICD-10-CM | POA: Diagnosis not present

## 2015-11-08 DIAGNOSIS — F419 Anxiety disorder, unspecified: Secondary | ICD-10-CM | POA: Insufficient documentation

## 2015-11-08 DIAGNOSIS — E039 Hypothyroidism, unspecified: Secondary | ICD-10-CM | POA: Insufficient documentation

## 2015-11-08 DIAGNOSIS — I252 Old myocardial infarction: Secondary | ICD-10-CM | POA: Diagnosis not present

## 2015-11-08 DIAGNOSIS — K7460 Unspecified cirrhosis of liver: Secondary | ICD-10-CM | POA: Diagnosis not present

## 2015-11-08 DIAGNOSIS — I2510 Atherosclerotic heart disease of native coronary artery without angina pectoris: Secondary | ICD-10-CM

## 2015-11-08 DIAGNOSIS — I237 Postinfarction angina: Secondary | ICD-10-CM | POA: Diagnosis present

## 2015-11-08 DIAGNOSIS — F319 Bipolar disorder, unspecified: Secondary | ICD-10-CM | POA: Insufficient documentation

## 2015-11-08 DIAGNOSIS — C786 Secondary malignant neoplasm of retroperitoneum and peritoneum: Secondary | ICD-10-CM | POA: Diagnosis not present

## 2015-11-08 DIAGNOSIS — C50919 Malignant neoplasm of unspecified site of unspecified female breast: Secondary | ICD-10-CM | POA: Insufficient documentation

## 2015-11-08 HISTORY — PX: CARDIAC CATHETERIZATION: SHX172

## 2015-11-08 LAB — CARDIAC CATHETERIZATION: Cath EF Quantitative: 65 %

## 2015-11-08 LAB — GLUCOSE, CAPILLARY: GLUCOSE-CAPILLARY: 71 mg/dL (ref 65–99)

## 2015-11-08 SURGERY — LEFT HEART CATH AND CORONARY ANGIOGRAPHY

## 2015-11-08 MED ORDER — LIDOCAINE HCL (PF) 1 % IJ SOLN
INTRAMUSCULAR | Status: AC
  Filled 2015-11-08: qty 30

## 2015-11-08 MED ORDER — FENTANYL CITRATE (PF) 100 MCG/2ML IJ SOLN
INTRAMUSCULAR | Status: AC
  Filled 2015-11-08: qty 2

## 2015-11-08 MED ORDER — HEPARIN SODIUM (PORCINE) 1000 UNIT/ML IJ SOLN
INTRAMUSCULAR | Status: DC | PRN
  Administered 2015-11-08: 3500 [IU] via INTRAVENOUS

## 2015-11-08 MED ORDER — ACETAMINOPHEN 325 MG PO TABS
ORAL_TABLET | ORAL | Status: AC
  Filled 2015-11-08: qty 2

## 2015-11-08 MED ORDER — SODIUM CHLORIDE 0.9 % IV SOLN: 250.0000 mL | INTRAVENOUS | Status: DC | PRN

## 2015-11-08 MED ORDER — SODIUM CHLORIDE 0.9% FLUSH: 3.0000 mL | Freq: Two times a day (BID) | INTRAVENOUS | Status: DC

## 2015-11-08 MED ORDER — HEPARIN (PORCINE) IN NACL 2-0.9 UNIT/ML-% IJ SOLN
INTRAMUSCULAR | Status: AC
  Filled 2015-11-08: qty 1000

## 2015-11-08 MED ORDER — ASPIRIN 81 MG PO CHEW: 81.0000 mg | CHEWABLE_TABLET | ORAL | Status: DC

## 2015-11-08 MED ORDER — ASPIRIN 81 MG PO CHEW
CHEWABLE_TABLET | ORAL | Status: AC
  Filled 2015-11-08: qty 1

## 2015-11-08 MED ORDER — LIDOCAINE HCL (PF) 1 % IJ SOLN
INTRAMUSCULAR | Status: DC | PRN
  Administered 2015-11-08: 2 mL

## 2015-11-08 MED ORDER — HEPARIN SODIUM (PORCINE) 1000 UNIT/ML IJ SOLN
INTRAMUSCULAR | Status: AC
  Filled 2015-11-08: qty 1

## 2015-11-08 MED ORDER — MIDAZOLAM HCL 2 MG/2ML IJ SOLN
INTRAMUSCULAR | Status: AC
  Filled 2015-11-08: qty 2

## 2015-11-08 MED ORDER — VERAPAMIL HCL 2.5 MG/ML IV SOLN
INTRAVENOUS | Status: AC
  Filled 2015-11-08: qty 2

## 2015-11-08 MED ORDER — HEPARIN (PORCINE) IN NACL 2-0.9 UNIT/ML-% IJ SOLN
INTRAMUSCULAR | Status: DC | PRN
  Administered 2015-11-08: 1000 mL

## 2015-11-08 MED ORDER — ACETAMINOPHEN 325 MG PO TABS
650.0000 mg | ORAL_TABLET | ORAL | Status: DC | PRN
  Administered 2015-11-08: 650 mg via ORAL

## 2015-11-08 MED ORDER — SODIUM CHLORIDE 0.9 % IV SOLN
INTRAVENOUS | Status: DC
  Administered 2015-11-08: 08:00:00 via INTRAVENOUS

## 2015-11-08 MED ORDER — SODIUM CHLORIDE 0.9% FLUSH: 3.0000 mL | INTRAVENOUS | Status: DC | PRN

## 2015-11-08 MED ORDER — IOPAMIDOL (ISOVUE-370) INJECTION 76%
INTRAVENOUS | Status: AC
  Filled 2015-11-08: qty 100

## 2015-11-08 MED ORDER — FENTANYL CITRATE (PF) 100 MCG/2ML IJ SOLN
INTRAMUSCULAR | Status: DC | PRN
  Administered 2015-11-08: 25 ug
  Administered 2015-11-08: 25 ug via INTRAVENOUS

## 2015-11-08 MED ORDER — ONDANSETRON HCL 4 MG/2ML IJ SOLN: 4.0000 mg | Freq: Four times a day (QID) | INTRAMUSCULAR | Status: DC | PRN

## 2015-11-08 MED ORDER — SODIUM CHLORIDE 0.9 % IV SOLN: INTRAVENOUS | Status: AC

## 2015-11-08 MED ORDER — IOPAMIDOL (ISOVUE-370) INJECTION 76%
INTRAVENOUS | Status: DC | PRN
  Administered 2015-11-08: 100 mL via INTRAVENOUS

## 2015-11-08 MED ORDER — VERAPAMIL HCL 2.5 MG/ML IV SOLN
INTRAVENOUS | Status: DC | PRN
  Administered 2015-11-08: 10 mL via INTRA_ARTERIAL

## 2015-11-08 MED ORDER — MIDAZOLAM HCL 2 MG/2ML IJ SOLN
INTRAMUSCULAR | Status: DC | PRN
  Administered 2015-11-08: 1 mg
  Administered 2015-11-08: 2 mg via INTRAVENOUS

## 2015-11-08 SURGICAL SUPPLY — 12 items
CATH INFINITI 5 FR JL3.5 (CATHETERS) ×3 IMPLANT
CATH INFINITI 5FR ANG PIGTAIL (CATHETERS) ×3 IMPLANT
CATH INFINITI JR4 5F (CATHETERS) ×3 IMPLANT
DEVICE RAD COMP TR BAND LRG (VASCULAR PRODUCTS) ×3 IMPLANT
GLIDESHEATH SLEND SS 6F .021 (SHEATH) ×3 IMPLANT
KIT HEART LEFT (KITS) ×3 IMPLANT
PACK CARDIAC CATHETERIZATION (CUSTOM PROCEDURE TRAY) ×3 IMPLANT
SYR MEDRAD MARK V 150ML (SYRINGE) ×3 IMPLANT
TRANSDUCER W/STOPCOCK (MISCELLANEOUS) ×3 IMPLANT
TUBING CIL FLEX 10 FLL-RA (TUBING) ×3 IMPLANT
WIRE HI TORQ VERSACORE-J 145CM (WIRE) ×3 IMPLANT
WIRE SAFE-T 1.5MM-J .035X260CM (WIRE) ×3 IMPLANT

## 2015-11-08 NOTE — Discharge Instructions (Signed)
Radial Site Care °Refer to this sheet in the next few weeks. These instructions provide you with information about caring for yourself after your procedure. Your health care provider may also give you more specific instructions. Your treatment has been planned according to current medical practices, but problems sometimes occur. Call your health care provider if you have any problems or questions after your procedure. °WHAT TO EXPECT AFTER THE PROCEDURE °After your procedure, it is typical to have the following: °· Bruising at the radial site that usually fades within 1-2 weeks. °· Blood collecting in the tissue (hematoma) that may be painful to the touch. It should usually decrease in size and tenderness within 1-2 weeks. °HOME CARE INSTRUCTIONS °· Take medicines only as directed by your health care provider. °· You may shower 24-48 hours after the procedure or as directed by your health care provider. Remove the bandage (dressing) and gently wash the site with plain soap and water. Pat the area dry with a clean towel. Do not rub the site, because this may cause bleeding. °· Do not take baths, swim, or use a hot tub until your health care provider approves. °· Check your insertion site every day for redness, swelling, or drainage. °· Do not apply powder or lotion to the site. °· Do not flex or bend the affected arm for 24 hours or as directed by your health care provider. °· Do not push or pull heavy objects with the affected arm for 24 hours or as directed by your health care provider. °· Do not lift over 10 lb (4.5 kg) for 5 days after your procedure or as directed by your health care provider. °· Ask your health care provider when it is okay to: °¨ Return to work or school. °¨ Resume usual physical activities or sports. °¨ Resume sexual activity. °· Do not drive home if you are discharged the same day as the procedure. Have someone else drive you. °· You may drive 24 hours after the procedure unless otherwise  instructed by your health care provider. °· Do not operate machinery or power tools for 24 hours after the procedure. °· If your procedure was done as an outpatient procedure, which means that you went home the same day as your procedure, a responsible adult should be with you for the first 24 hours after you arrive home. °· Keep all follow-up visits as directed by your health care provider. This is important. °SEEK MEDICAL CARE IF: °· You have a fever. °· You have chills. °· You have increased bleeding from the radial site. Hold pressure on the site. °SEEK IMMEDIATE MEDICAL CARE IF: °· You have unusual pain at the radial site. °· You have redness, warmth, or swelling at the radial site. °· You have drainage (other than a small amount of blood on the dressing) from the radial site. °· The radial site is bleeding, and the bleeding does not stop after 30 minutes of holding steady pressure on the site. °· Your arm or hand becomes pale, cool, tingly, or numb. °  °This information is not intended to replace advice given to you by your health care provider. Make sure you discuss any questions you have with your health care provider. °  °Document Released: 03/10/2010 Document Revised: 02/26/2014 Document Reviewed: 08/24/2013 °Elsevier Interactive Patient Education ©2016 [employer] ° °

## 2015-11-08 NOTE — Interval H&P Note (Signed)
History and Physical Interval Note:  11/08/2015 8:06 AM  [name]  has presented today for surgery, with the diagnosis of cad - cp - relook for ?post-infarct angian.   The various methods of treatment have been discussed with the patient and family. After consideration of risks, benefits and other options for treatment, the patient has consented to  Procedure(s): Left Heart Cath and Coronary Angiography (N/A) with possible PCI as a surgical intervention .  The patient's history has been reviewed, patient examined, no change in status, stable for surgery.  I have reviewed the patient's chart and labs.  Questions were answered to the patient's satisfaction.     Cath Lab Visit (complete for each Cath Lab visit)  Clinical Evaluation Leading to the Procedure:   ACS: No.  Non-ACS:    Anginal Classification: CCS III  Anti-ischemic medical therapy: Minimal Therapy (1 class of medications)  Non-Invasive Test Results: No non-invasive testing performed  Prior CABG: No previous CABG  Julie Hew, MD       [name]

## 2015-11-08 NOTE — Interval H&P Note (Signed)
History and Physical Interval Note:  11/08/2015 11:02 AM  [name]  has presented today for surgery, with the diagnosis of cad - cp - relook  The various methods of treatment have been discussed with the patient and family. After consideration of risks, benefits and other options for treatment, the patient has consented to  Procedure(s): Left Heart Cath and Coronary Angiography (N/A) and possible anCath Lab Visit (complete for each Cath Lab visit)  Clinical Evaluation Leading to the Procedure:   ACS: No.  Non-ACS:    Anginal Classification: CCS IV  Anti-ischemic medical therapy: Maximal Therapy (2 or more classes of medications)  Non-Invasive Test Results: No non-invasive testing performed  Prior CABG: No previous CABG      gioplasty as a surgical intervention .  The patient's history has been reviewed, patient examined, no change in status, stable for surgery.  I have reviewed the patient's chart and labs.  Questions were answered to the patient's satisfaction.     [name], Quillian Quince

## 2015-11-08 NOTE — Progress Notes (Signed)
Patient removed from procedure table prior to case start and brought to holding area due to a STEMI.  Patient received sedation, and VS being monitored.  VS WNL and patient is resting comfortably.  No vascular site to assess at this time.

## 2015-11-08 NOTE — Telephone Encounter (Signed)
Follow up    Pt calling for medication verification, isosorbide momoniprate  The discharge instructions ask that pt take more than 30mg  that Scott prescribed

## 2015-11-08 NOTE — H&P (View-Only) (Signed)
Cardiology Office Note:    Date:  11/04/2015   ID:  [name], DOB Apr 11, 1955, MRN [id number]  PCP:  London Pepper, MD  Cardiologist:  Dr. Dorris Carnes   Electrophysiologist:  n/a  Referring MD: London Pepper, MD   Chief Complaint  Patient presents with  . Hospitalization Follow-up    s/p NSTEMI   History of Present Illness:    [name] is a 60 y.o. female with a hx of Peritoneal CA invasive breast CA, diabetes, HL, sleep apnea.  Admitted 9/5-9/7 with non-STEMI. LHC demonstrated single vessel CAD with 99% distal LAD stenosis treated with Xience DES. EF was 35-45% at cardiac catheterization. Follow-up echo demonstrated normal LV function with any of [id number] and mild diastolic dysfunction.  She returns for FU.    She is here with her husband. She notes episodic chest discomfort since she was in the hospital. This is the same pain that brought her to the hospital. She notes with activity. She does have pain at rest at times. She does note improvement in her symptoms if she sits up. She denies significant dyspnea. She denies orthopnea, PND or edema. She denies syncope. She has taken nitroglycerin with relief of her chest symptoms.  Prior CV studies that were reviewed today include:    Echo 10/27/15 Mild concentric LVH, EF 60-65%, normal wall motion, grade 1 diastolic dysfunction, mild LAE, trivial TR, PASP 10 mmHg  LHC 10/26/15 LM ok LAD dist 99% LCx mid 30% RCA irregs; ost RPDA 40%, RPLB2 30% EF 35-45% PCI: 2.25 x 15 mm Xience Alpine DES to distal LAD 1. Severe one-vessel coronary artery disease affecting the distal LAD. Relatively small vessel post stenosis. 2. Moderately reduced LV systolic function with an ejection fraction of 35-40% with severe hypokinesis of the mid distal anterior wall and apical area. 3. Moderately elevated left ventricular end-diastolic pressure. 4. Successful angioplasty and drug-eluting stent placement to the distal LAD. Recommendations: Dual  antiplatelet therapy for one year or at least 6 months. I started small dose carvedilol. Initiate treatment with an ACE inhibitor before hospital discharge.  Chest CTA 10/25/15 IMPRESSION: 1. No embolus or acute vascular findings identified in the chest. 2. Cirrhosis.  Venous Duplex 10/25/15 Summary: No evidence of deep vein thrombosis involving the left upper extremity.  Past Medical History:  Diagnosis Date  . ADD (attention deficit disorder)   . Allergy    seasonal  . Anxiety   . Bipolar disorder ([place])   . Breast cancer ([place]) 1989   right breast  . Depression   . Diabetes mellitus    pt denies DM noe meds  . Hematochezia   . Hyperlipidemia   . Hypothyroidism   . Maintenance chemotherapy    Pt has chemo every 3 weeks (on Friday)  . Osteopenia 03/2009   t score -2.1 FRAX 4.6/0.4  . Premature menopause   . Sleep apnea    mild    Past Surgical History:  Procedure Laterality Date  . BREAST SURGERY  1989   RIGHT LUMPECTOMY, RADIATION AND CHEMO  . CARDIAC CATHETERIZATION N/A 10/26/2015   Procedure: Left Heart Cath and Coronary Angiography;  Surgeon: Wellington Hampshire, MD;  Location: [facility];  Service: Cardiovascular;  Laterality: N/A;  . CARDIAC CATHETERIZATION N/A 10/26/2015   Procedure: Coronary Stent Intervention;  Surgeon: Wellington Hampshire, MD;  Location: [facility];  Service: Cardiovascular;  Laterality: N/A;  . FOOT SURGERY  2013   BILATERAL   . HYSTEROSCOPY  2011   [place]  . PELVIC LAPAROSCOPY/ Hysteroscopy  1996    Current Medications: Outpatient Medications Prior to Visit  Medication Sig Dispense Refill  . Acetylcarnitine HCl (ACETYL L-CARNITINE PO) Take 2,000 mg by mouth at bedtime.    Marland Kitchen aspirin 81 MG chewable tablet Chew 1 tablet (81 mg total) by mouth daily. 30 tablet 0  . atorvastatin (LIPITOR) 40 MG tablet Take 1 tablet (40 mg total) by mouth daily at 6 PM. 30 tablet 0  . carvedilol (COREG) 3.125 MG tablet Take 0.5 tablets (1.56 mg total) by  mouth 2 (two) times daily with a meal. 30 tablet 0  . clopidogrel (PLAVIX) 75 MG tablet Take 1 tablet (75 mg total) by mouth daily with breakfast. 30 tablet 0  . divalproex (DEPAKOTE) 250 MG DR tablet Take 500-750 mg by mouth 2 (two) times daily. Take 500mg s in the morning and 750mg s at night    . fluticasone (FLONASE) 50 MCG/ACT nasal spray INHALE 2 SPRAYS INTO EACH NOSTRIL EVERY NIGHT  11  . gabapentin (NEURONTIN) 100 MG capsule Take 1 capsule (100 mg total) by mouth 3 (three) times daily. 90 capsule 1  . lamoTRIgine (LAMICTAL) 100 MG tablet Take 100 mg by mouth 2 (two) times daily.    Marland Kitchen levothyroxine (SYNTHROID, LEVOTHROID) 100 MCG tablet Take 100 mcg by mouth daily before breakfast.     . lidocaine-prilocaine (EMLA) cream Apply topically as needed. Apply to port with every chemotherapy. 30 g 0  . lisinopril (PRINIVIL,ZESTRIL) 2.5 MG tablet Take 1 tablet (2.5 mg total) by mouth daily. 30 tablet 0  . loperamide (IMODIUM A-D) 2 MG tablet Take 2 mg by mouth as needed for diarrhea or loose stools.    Marland Kitchen LORazepam (ATIVAN) 1 MG tablet Take 1 tablet (1 mg total) by mouth every 6 (six) hours as needed for anxiety. 30 tablet 0  . nitroGLYCERIN (NITROSTAT) 0.4 MG SL tablet Place 1 tablet (0.4 mg total) under the tongue every 5 (five) minutes as needed for chest pain. 30 tablet 0  . ondansetron (ZOFRAN) 8 MG tablet TAKE 1 TABLET BY MOUTH EVERY 8 HOURS AS NEEDED FOR NAUSEA AND VOMITING 20 tablet 1  . pantoprazole (PROTONIX) 40 MG tablet Take 1 tablet (40 mg total) by mouth daily. 30 tablet 0  . prochlorperazine (COMPAZINE) 10 MG tablet Take 1 tablet (10 mg total) by mouth every 6 (six) hours as needed for nausea or vomiting. 30 tablet 1  . rOPINIRole (REQUIP) 0.5 MG tablet Take 0.5-1 mg by mouth at bedtime. Take 1 to 3 hours before bedtime     . rOPINIRole (REQUIP) 1 MG tablet Take 1 mg by mouth at bedtime.     . ALPRAZolam (XANAX) 0.5 MG tablet Take 1 tablet (0.5 mg total) by mouth at bedtime as needed for  anxiety (1-2 tabs @@ hs and 1 tab day of chemotherapy treatments). (Patient taking differently: Take 0.5-1 mg by mouth 2 (two) times daily. ) 30 tablet 0   Facility-Administered Medications Prior to Visit  Medication Dose Route Frequency Provider Last Rate Last Dose  . heparin lock flush 100 unit/mL  500 Units Intravenous Once Wyatt Portela, MD      . sodium chloride flush (NS) 0.9 % injection 10 mL  10 mL Intravenous PRN Wyatt Portela, MD          Allergies:   Doxil [doxorubicin hcl liposomal] and Pollen extract   Social History   Social History  . Marital status: Married  Spouse name: [name]  . Number of children: 0  . Years of education: 14   Occupational History  . unemployed [employer], [place]   Social History Main Topics  . Smoking status: Never Smoker  . Smokeless tobacco: Never Used  . Alcohol use 0.0 oz/week     Comment: very rarely - maybe 1 glass wine every 3 mos  . Drug use: No  . Sexual activity: No   Other Topics Concern  . None   Social History Narrative   Lives at home with husband.    Caffeine use:  Tea/soda occass     Family History:  The patient's family history includes Allergies in her father; Breast cancer in her maternal aunt and maternal aunt; Breast cancer (age of onset: 14) in her maternal aunt; Cancer in her cousin and cousin; Diabetes in her maternal grandmother; Heart Problems in her paternal grandfather; Heart disease in her maternal grandfather, maternal grandmother, and mother; Hypertension in her paternal grandfather; Liver cancer (age of onset: 32) in her cousin; Other in her mother.   ROS:   Please see the history of present illness.    Review of Systems  Cardiovascular: Positive for chest pain.   All other systems reviewed and are negative.   EKGs/Labs/Other Test Reviewed:    EKG:  EKG is  ordered today.  The ekg ordered today demonstrates NSR, HR 73, normal axis, T-wave inversions 1, 2, aVL, V3-V6, QTc 436 ms, similar to  tracing dated 10/27/15  Recent Labs: 10/26/2015: ALT 15; TSH 5.305 10/27/2015: BUN 12; Creatinine, Ser 0.62; Hemoglobin 8.5; Magnesium 1.5; Platelets 70; Potassium 4.7; Sodium 137   Recent Lipid Panel    Component Value Date/Time   CHOL 161 10/26/2015 0340   TRIG 162 (H) 10/26/2015 0340   HDL 38 (L) 10/26/2015 0340   CHOLHDL 4.2 10/26/2015 0340   VLDL 32 10/26/2015 0340   LDLCALC 91 10/26/2015 0340   LDLDIRECT 151.3 08/24/2009 0803     Physical Exam:    VS:  BP 118/74   Pulse 73   Ht 5\' 2"  (1.575 m)   Wt 170 lb 6.4 oz (77.3 kg)   BMI 31.17 kg/m     Wt Readings from Last 3 Encounters:  11/04/15 170 lb 6.4 oz (77.3 kg)  10/27/15 162 lb 14.7 oz (73.9 kg)  10/21/15 165 lb (74.8 kg)     Physical Exam  Constitutional: She is oriented to person, place, and time. She appears well-developed and well-nourished. No distress.  HENT:  Head: Normocephalic and atraumatic.  Eyes: No scleral icterus.  Neck: Normal range of motion. No JVD present.  Cardiovascular: Normal rate, regular rhythm, S1 normal and S2 normal.  Exam reveals no friction rub.   No murmur heard. Pulmonary/Chest: Breath sounds normal. She has no wheezes. She has no rhonchi. She has no rales.  Abdominal: Soft. There is no tenderness.  Musculoskeletal: She exhibits no edema.  R wrist with ecchymosis but no hematoma  Neurological: She is alert and oriented to person, place, and time.  Skin: Skin is warm and dry.  Psychiatric: She has a normal mood and affect.    ASSESSMENT:    1. Coronary artery disease involving native coronary artery of native heart with other form of angina pectoris ([place])   2. NSTEMI (non-ST elevated myocardial infarction) ([place])   3. Hyperlipidemia   4. Primary peritoneal carcinomatosis ([place])   5. Thrombocytopenia ([place])    PLAN:    In order of problems listed above:  1. CAD - She presents for follow-up post-hospitalization with complaints of chest discomfort consistent with angina. Her  symptoms never resolved after PCI. Her ECG remains unchanged. Her symptoms have been severe at times and she's required nitroglycerin with relief. I reviewed her case today with Dr. Angelena Form (DOD). We recommend proceeding with re-look cardiac catheterization to better assess her symptoms. Question if her symptoms may be related to spasm as well. They do not sound gastrointestinal in nature.  Risks and benefits of cardiac catheterization have been discussed with the patient.  These include bleeding, infection, kidney damage, stroke, heart attack, death.  The patient understands these risks and is willing to proceed.   -  Arrange left heart catheterization  -  Add isosorbide 15 mg daily  -  Continue aspirin, statin, beta blocker, Plavix, ACE inhibitor.  -  She just had CBC, BMET done with PCP 2 days ago - will request report  2. S/p NSTEMI - As noted, she continues to have chest discomfort consistent with angina. Proceed with cardiac catheterization as noted. Continue aspirin, Plavix, statin, beta blocker, ACE inhibitor. EF was preserved by echocardiogram post catheterization. Eventually try to set her up with cardiac rehabilitation.  3. HL - continue statin.  4. CA - Continue follow-up with oncology. She undergoes another round of chemotherapy next Thursday.  5. Thrombocytopenia - This has been chronic and stable for quite some time. Will need to keep a close eye on this with the need to be on dual antiplatelet therapy for a minimum of 12 months.   Medication Adjustments/Labs and Tests Ordered: Current medicines are reviewed at length with the patient today.  Concerns regarding medicines are outlined above.  Medication changes, Labs and Tests ordered today are outlined in the Patient Instructions noted below. Patient Instructions  Medication Instructions:  START IMDUR 15 MG DAILY; THIS WILL BE 1/2 TABLET OF THE 30 MG TABLET  Labwork: NONE  Testing/Procedures: Your physician has requested that  you have an echocardiogram. Echocardiography is a painless test that uses sound waves to create images of your heart. It provides your doctor with information about the size and shape of your heart and how well your heart's chambers and valves are working. This procedure takes approximately one hour. There are no restrictions for this procedure.  Follow-Up: DR. Harrington Challenger 2 WEEKS POST APPT NEEDED Any Other Special Instructions Will Be Listed Below (If Applicable). If you need a refill on your cardiac medications before your next appointment, please call your pharmacy.   Signed, Richardson Dopp, PA-C  11/04/2015 2:28 PM    [facility] [address], [place], [place]  [address] Phone: [phone]; Fax: [phone]   I discussed this patient with Richardson Dopp, PA-C. I did not examine her today. I agree with his plan.   Lauree Chandler 11/04/2015 3:18 PM

## 2015-11-08 NOTE — Telephone Encounter (Signed)
Tried to rtn call to pt from earlier today, though no answer. Pt was calling per her discharge instructions stating to take more Imdur that what Brynda Rim. PA had prescribed the other day. I reviewed d/c however I could not find where it stated an increase in the Imdur. I will review with Brynda Rim. PA for further advice.

## 2015-11-09 MED ORDER — ISOSORBIDE MONONITRATE ER 30 MG PO TB24: 15.0000 mg | ORAL_TABLET | Freq: Every day | ORAL | 11 refills | Status: DC

## 2015-11-09 NOTE — Telephone Encounter (Signed)
Pt notified of lab results by phone with verbal understanding. Pt had a question as to about the dose of her Imdur. Bottle states take 30 mg daily. Pt states when she saw Richardson Dopp, [place] 11/04/15 he told her to take 1/2 tablet daily. I confirmed with the pt yes she is only supposed to take 1/2 tablet daily of Imdur = 15 mg daily. I apologized for the error. I have sent in a new Rx to he pharmacy to have on file with the new sig to take 1/2 tablet daily = 15 mg daily. Pt said thank you for helping her today .

## 2015-11-10 ENCOUNTER — Ambulatory Visit (HOSPITAL_BASED_OUTPATIENT_CLINIC_OR_DEPARTMENT_OTHER): Payer: PPO

## 2015-11-10 ENCOUNTER — Other Ambulatory Visit: Payer: PPO

## 2015-11-10 ENCOUNTER — Ambulatory Visit (HOSPITAL_BASED_OUTPATIENT_CLINIC_OR_DEPARTMENT_OTHER): Payer: PPO | Admitting: Oncology

## 2015-11-10 ENCOUNTER — Ambulatory Visit: Payer: PPO | Admitting: Oncology

## 2015-11-10 ENCOUNTER — Ambulatory Visit: Payer: PPO

## 2015-11-10 ENCOUNTER — Other Ambulatory Visit (HOSPITAL_BASED_OUTPATIENT_CLINIC_OR_DEPARTMENT_OTHER): Payer: PPO

## 2015-11-10 ENCOUNTER — Telehealth: Payer: Self-pay | Admitting: Oncology

## 2015-11-10 VITALS — BP 108/64 | HR 71 | Temp 97.9°F | Resp 18 | Ht 62.0 in | Wt 166.4 lb

## 2015-11-10 DIAGNOSIS — C482 Malignant neoplasm of peritoneum, unspecified: Secondary | ICD-10-CM

## 2015-11-10 DIAGNOSIS — C50912 Malignant neoplasm of unspecified site of left female breast: Secondary | ICD-10-CM

## 2015-11-10 DIAGNOSIS — Z5111 Encounter for antineoplastic chemotherapy: Secondary | ICD-10-CM

## 2015-11-10 DIAGNOSIS — D696 Thrombocytopenia, unspecified: Secondary | ICD-10-CM

## 2015-11-10 DIAGNOSIS — R140 Abdominal distension (gaseous): Secondary | ICD-10-CM

## 2015-11-10 DIAGNOSIS — C50919 Malignant neoplasm of unspecified site of unspecified female breast: Secondary | ICD-10-CM

## 2015-11-10 DIAGNOSIS — Z95828 Presence of other vascular implants and grafts: Secondary | ICD-10-CM

## 2015-11-10 LAB — COMPREHENSIVE METABOLIC PANEL
ALBUMIN: 3.4 g/dL — AB (ref 3.5–5.0)
ALK PHOS: 89 U/L (ref 40–150)
ALT: <9 U/L (ref 0–55)
AST: 15 U/L (ref 5–34)
Anion Gap: 7 mEq/L (ref 3–11)
BUN: 17 mg/dL (ref 7.0–26.0)
CALCIUM: 9.5 mg/dL (ref 8.4–10.4)
CO2: 26 mEq/L (ref 22–29)
Chloride: 105 mEq/L (ref 98–109)
Creatinine: 0.7 mg/dL (ref 0.6–1.1)
Glucose: 105 mg/dl (ref 70–140)
POTASSIUM: 4.4 meq/L (ref 3.5–5.1)
Sodium: 139 mEq/L (ref 136–145)
Total Bilirubin: 0.3 mg/dL (ref 0.20–1.20)
Total Protein: 8.1 g/dL (ref 6.4–8.3)

## 2015-11-10 LAB — CBC WITH DIFFERENTIAL/PLATELET
BASO%: 0.3 % (ref 0.0–2.0)
BASOS ABS: 0 10*3/uL (ref 0.0–0.1)
EOS ABS: 0.1 10*3/uL (ref 0.0–0.5)
EOS%: 1.3 % (ref 0.0–7.0)
HEMATOCRIT: 28.6 % — AB (ref 34.8–46.6)
HEMOGLOBIN: 8.8 g/dL — AB (ref 11.6–15.9)
LYMPH#: 1.4 10*3/uL (ref 0.9–3.3)
LYMPH%: 36.8 % (ref 14.0–49.7)
MCH: 31.2 pg (ref 25.1–34.0)
MCHC: 30.8 g/dL — ABNORMAL LOW (ref 31.5–36.0)
MCV: 101.4 fL — AB (ref 79.5–101.0)
MONO#: 0.5 10*3/uL (ref 0.1–0.9)
MONO%: 11.6 % (ref 0.0–14.0)
NEUT#: 2 10*3/uL (ref 1.5–6.5)
NEUT%: 50 % (ref 38.4–76.8)
Platelets: 58 10*3/uL — ABNORMAL LOW (ref 145–400)
RBC: 2.82 10*6/uL — ABNORMAL LOW (ref 3.70–5.45)
RDW: 16.2 % — AB (ref 11.2–14.5)
WBC: 3.9 10*3/uL (ref 3.9–10.3)

## 2015-11-10 MED ORDER — SODIUM CHLORIDE 0.9 % IJ SOLN
10.0000 mL | INTRAMUSCULAR | Status: DC | PRN
  Administered 2015-11-10: 10 mL via INTRAVENOUS
  Filled 2015-11-10: qty 10

## 2015-11-10 MED ORDER — PALONOSETRON HCL INJECTION 0.25 MG/5ML: 0.2500 mg | Freq: Once | INTRAVENOUS | Status: DC

## 2015-11-10 MED ORDER — HEPARIN SOD (PORK) LOCK FLUSH 100 UNIT/ML IV SOLN
500.0000 [IU] | Freq: Once | INTRAVENOUS | Status: AC | PRN
  Administered 2015-11-10: 500 [IU]
  Filled 2015-11-10: qty 5

## 2015-11-10 MED ORDER — SODIUM CHLORIDE 0.9% FLUSH
10.0000 mL | INTRAVENOUS | Status: DC | PRN
  Administered 2015-11-10: 10 mL
  Filled 2015-11-10: qty 10

## 2015-11-10 MED ORDER — SODIUM CHLORIDE 0.9 % IV SOLN
570.0000 mg | Freq: Once | INTRAVENOUS | Status: AC
  Administered 2015-11-10: 570 mg via INTRAVENOUS
  Filled 2015-11-10: qty 57

## 2015-11-10 MED ORDER — SODIUM CHLORIDE 0.9 % IV SOLN
4.0000 mg | Freq: Once | INTRAVENOUS | Status: AC
  Administered 2015-11-10: 4 mg via INTRAVENOUS
  Filled 2015-11-10: qty 0.4

## 2015-11-10 MED ORDER — CARBOPLATIN CHEMO INTRADERMAL TEST DOSE 100MCG/0.02ML
100.0000 ug | Freq: Once | INTRADERMAL | Status: AC
  Administered 2015-11-10: 100 ug via INTRADERMAL
  Filled 2015-11-10: qty 0.01

## 2015-11-10 MED ORDER — SODIUM CHLORIDE 0.9 % IV SOLN
Freq: Once | INTRAVENOUS | Status: AC
  Administered 2015-11-10: 14:00:00 via INTRAVENOUS

## 2015-11-10 NOTE — Telephone Encounter (Signed)
Gave patient avs report and appointments for September thru November  °

## 2015-11-10 NOTE — Patient Instructions (Signed)
Carboplatin injection  What is this medicine?  CARBOPLATIN (KAR boe pla tin) is a chemotherapy drug. It targets fast dividing cells, like cancer cells, and causes these cells to die. This medicine is used to treat ovarian cancer and many other cancers.  This medicine may be used for other purposes; ask your health care provider or pharmacist if you have questions.  What should I tell my health care provider before I take this medicine?  They need to know if you have any of these conditions:  -blood disorders  -hearing problems  -kidney disease  -recent or ongoing radiation therapy  -an unusual or allergic reaction to carboplatin, cisplatin, other chemotherapy, other medicines, foods, dyes, or preservatives  -pregnant or trying to get pregnant  -breast-feeding  How should I use this medicine?  This drug is usually given as an infusion into a vein. It is administered in a hospital or clinic by a specially trained health care professional.  Talk to your pediatrician regarding the use of this medicine in children. Special care may be needed.  Overdosage: If you think you have taken too much of this medicine contact a poison control center or emergency room at once.  NOTE: This medicine is only for you. Do not share this medicine with others.  What if I miss a dose?  It is important not to miss a dose. Call your doctor or health care professional if you are unable to keep an appointment.  What may interact with this medicine?  -medicines for seizures  -medicines to increase blood counts like filgrastim, pegfilgrastim, sargramostim  -some antibiotics like amikacin, gentamicin, neomycin, streptomycin, tobramycin  -vaccines  Talk to your doctor or health care professional before taking any of these medicines:  -acetaminophen  -aspirin  -ibuprofen  -ketoprofen  -naproxen  This list may not describe all possible interactions. Give your health care provider a list of all the medicines, herbs, non-prescription drugs, or dietary  supplements you use. Also tell them if you smoke, drink alcohol, or use illegal drugs. Some items may interact with your medicine.  What should I watch for while using this medicine?  Your condition will be monitored carefully while you are receiving this medicine. You will need important blood work done while you are taking this medicine.  This drug may make you feel generally unwell. This is not uncommon, as chemotherapy can affect healthy cells as well as cancer cells. Report any side effects. Continue your course of treatment even though you feel ill unless your doctor tells you to stop.  In some cases, you may be given additional medicines to help with side effects. Follow all directions for their use.  Call your doctor or health care professional for advice if you get a fever, chills or sore throat, or other symptoms of a cold or flu. Do not treat yourself. This drug decreases your body's ability to fight infections. Try to avoid being around people who are sick.  This medicine may increase your risk to bruise or bleed. Call your doctor or health care professional if you notice any unusual bleeding.  Be careful brushing and flossing your teeth or using a toothpick because you may get an infection or bleed more easily. If you have any dental work done, tell your dentist you are receiving this medicine.  Avoid taking products that contain aspirin, acetaminophen, ibuprofen, naproxen, or ketoprofen unless instructed by your doctor. These medicines may hide a fever.  Do not become pregnant while taking this medicine.   Women should inform their doctor if they wish to become pregnant or think they might be pregnant. There is a potential for serious side effects to an unborn child. Talk to your health care professional or pharmacist for more information. Do not breast-feed an infant while taking this medicine.  What side effects may I notice from receiving this medicine?  Side effects that you should report to your  doctor or health care professional as soon as possible:  -allergic reactions like skin rash, itching or hives, swelling of the face, lips, or tongue  -signs of infection - fever or chills, cough, sore throat, pain or difficulty passing urine  -signs of decreased platelets or bleeding - bruising, pinpoint red spots on the skin, black, tarry stools, nosebleeds  -signs of decreased red blood cells - unusually weak or tired, fainting spells, lightheadedness  -breathing problems  -changes in hearing  -changes in vision  -chest pain  -high blood pressure  -low blood counts - This drug may decrease the number of white blood cells, red blood cells and platelets. You may be at increased risk for infections and bleeding.  -nausea and vomiting  -pain, swelling, redness or irritation at the injection site  -pain, tingling, numbness in the hands or feet  -problems with balance, talking, walking  -trouble passing urine or change in the amount of urine  Side effects that usually do not require medical attention (report to your doctor or health care professional if they continue or are bothersome):  -hair loss  -loss of appetite  -metallic taste in the mouth or changes in taste  This list may not describe all possible side effects. Call your doctor for medical advice about side effects. You may report side effects to FDA at 1-800-FDA-1088.  Where should I keep my medicine?  This drug is given in a hospital or clinic and will not be stored at home.  NOTE: This sheet is a summary. It may not cover all possible information. If you have questions about this medicine, talk to your doctor, pharmacist, or health care provider.      2016, Elsevier/Gold Standard. (2007-05-13 14:38:05)

## 2015-11-10 NOTE — Patient Instructions (Signed)

## 2015-11-10 NOTE — Progress Notes (Signed)
Ok to treat with platelet count 58 per Dr. Alen Blew

## 2015-11-10 NOTE — Progress Notes (Signed)
Hematology and Oncology Follow Up Visit  [name] [id number] September 24, 1955 60 y.o. 11/10/2015 1:38 PM    Principle Diagnosis: 60 year old woman diagnosed with:  1. 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 This was diagnosed in July of 2014. 2. 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 The tumor is ER positive PR negative, HER-2 negative.   Prior Therapy:  She is status post paracentesis performed on 09/17/2012 with the cytology confirmed the presence of adenocarcinoma and immunohistochemical stains suggest GYN etiology. She is also status post lumpectomy for breast cancer diagnosed 25 years ago followed by radiation and chemotherapy under the care of Dr. Sonny Dandy. She did not receive any hormonal therapy. Chemotherapy utilizing carboplatin and Taxotere started on 10/01/2012. Avastin was added with cycle 4. This was discontinued in June of 2015. She is S/P Avastin maintenance only between June 2015 till March 2017. Therapy discontinued due to progression of disease. She is status post Doxil salvage chemotherapy therapy discontinued because of hypersensitivity reaction in March 2017.  Current therapy:  Carboplatin and AUC of 5 every 3 weeks cycle 1 started on 07/08/2015. She is here for cycle 7 of therapy.  Interim History: Mrs. Leu presents today for a followup visit with her husband. Since her last visit, she developed substernal chest pain and was evaluated in the emergency department on 10/26/2015. Her evaluation including a CT scan of the chest which did not show any pulmonary embolism. Her troponin her older and for myocardial infarction and underwent catheterization under the care of Dr. Harrington Challenger. Likely she feels reasonably well without any complaints at this time. She does not report any chest pain or difficulty breathing. She denied any nausea or  vomiting. She is currently receiving medical therapy for her coronary disease.   She continues to have reasonable quality of life and performance status. She continues to be very active. She tolerated chemotherapy without any side effects associated with the last cycle. She denied any nausea or vomiting. She denied any peripheral neuropathy or bleeding.  She does not report any headaches, blurry vision, syncope or seizures. She denied any fever chills. She does report some chest discomfort but no dyspnea on exertion. Has not reported any shortness of breath or cough. She denied any pain with swallowing. She does not report any vomiting, constipation does report some occasional diarrhea. She denied any GYN bleeding. She reports no anxiety or depression. Remainder of the review of systems is unremarkable.  Medications: I have reviewed the patient's current medications.  Current Outpatient Prescriptions  Medication Sig Dispense Refill  . Acetylcarnitine HCl (ACETYL L-CARNITINE PO) Take 2,000 mg by mouth at bedtime.    . ALPRAZolam (XANAX) 0.5 MG tablet Take 0.5 mg by mouth 2 (two) times daily as needed for anxiety.    Marland Kitchen aspirin 81 MG chewable tablet Chew 1 tablet (81 mg total) by mouth daily. 30 tablet 0  . atorvastatin (LIPITOR) 40 MG tablet Take 1 tablet (40 mg total) by mouth daily at 6 PM. 30 tablet 0  . carvedilol (COREG) 3.125 MG tablet Take 0.5 tablets (1.56 mg total) by mouth 2 (two) times daily with a meal. 30 tablet 0  . clopidogrel (PLAVIX) 75 MG tablet Take 1 tablet (75 mg total) by mouth daily with breakfast. 30 tablet 0  . divalproex (DEPAKOTE) 250 MG DR tablet Take 500-750 mg by mouth 2 (two) times daily.  Take '500mg'$ s in the morning and '750mg'$ s at night    . fluticasone (FLONASE) 50 MCG/ACT nasal spray INHALE 2 SPRAYS INTO EACH NOSTRIL EVERY NIGHT  11  . gabapentin (NEURONTIN) 100 MG capsule Take 1 capsule (100 mg total) by mouth 3 (three) times daily. 90 capsule 1  . isosorbide  mononitrate (IMDUR) 30 MG 24 hr tablet Take 0.5 tablets (15 mg total) by mouth daily. 30 tablet 11  . lamoTRIgine (LAMICTAL) 100 MG tablet Take 100 mg by mouth 2 (two) times daily.    Marland Kitchen levothyroxine (SYNTHROID, LEVOTHROID) 100 MCG tablet Take 100 mcg by mouth daily before breakfast.     . lidocaine-prilocaine (EMLA) cream Apply topically as needed. Apply to port with every chemotherapy. 30 g 0  . lisinopril (PRINIVIL,ZESTRIL) 2.5 MG tablet Take 1 tablet (2.5 mg total) by mouth daily. 30 tablet 0  . loperamide (IMODIUM A-D) 2 MG tablet Take 2 mg by mouth as needed for diarrhea or loose stools.    Marland Kitchen LORazepam (ATIVAN) 1 MG tablet Take 1 tablet (1 mg total) by mouth every 6 (six) hours as needed for anxiety. 30 tablet 0  . nitroGLYCERIN (NITROSTAT) 0.4 MG SL tablet Place 1 tablet (0.4 mg total) under the tongue every 5 (five) minutes as needed for chest pain. 30 tablet 0  . ondansetron (ZOFRAN) 8 MG tablet TAKE 1 TABLET BY MOUTH EVERY 8 HOURS AS NEEDED FOR NAUSEA AND VOMITING 20 tablet 1  . pantoprazole (PROTONIX) 40 MG tablet Take 1 tablet (40 mg total) by mouth daily. 30 tablet 0  . prochlorperazine (COMPAZINE) 10 MG tablet Take 1 tablet (10 mg total) by mouth every 6 (six) hours as needed for nausea or vomiting. 30 tablet 1  . rOPINIRole (REQUIP) 0.5 MG tablet Take 0.5-1 mg by mouth at bedtime. Take 1 to 3 hours before bedtime      No current facility-administered medications for this visit.    Facility-Administered Medications Ordered in Other Visits  Medication Dose Route Frequency Provider Last Rate Last Dose  . heparin lock flush 100 unit/mL  500 Units Intravenous Once Wyatt Portela, MD      . sodium chloride flush (NS) 0.9 % injection 10 mL  10 mL Intravenous PRN Wyatt Portela, MD         Allergies:  Allergies  Allergen Reactions  . Doxil [Doxorubicin Hcl Liposomal] Anaphylaxis    1st Doxil.   . Pollen Extract Other (See Comments)    Pollen and grass causes a lot sneezing     Past Medical History, Surgical history, Social history, and Family History were reviewed and updated.   Physical Exam: Blood pressure 108/64, pulse 71, temperature 97.9 F (36.6 C), temperature source Oral, resp. rate 18, height '5\' 2"'$  (1.575 m), weight 166 lb 6.4 oz (75.5 kg), SpO2 100 %.  ECOG: 1 General appearance: Well-appearing woman without distress. Head: Normocephalic, without obvious abnormality. No oral ulcers or lesions. Neck: no adenopathy Lymph nodes: Cervical, supraclavicular, and axillary nodes normal. Heart:regular rate and rhythm, S1, S2 normal, no murmur, click, rub or gallop Lung:chest clear, no wheezing, rales, normal symmetric air entry Abdomin: Soft, nontender with good bowel sounds. No rebound or guarding. EXT:no erythema, induration, or nodules. No edema. Skin: No rashes or lesions. No ecchymosis noted.  Lab Results: Lab Results  Component Value Date   WBC 3.9 11/10/2015   HGB 8.8 (L) 11/10/2015   HCT 28.6 (L) 11/10/2015   MCV 101.4 (H) 11/10/2015   PLT 58 (L)  11/10/2015     Chemistry      Component Value Date/Time   NA 137 10/27/2015 0430   NA 141 10/21/2015 1047   K 4.7 10/27/2015 0430   K 4.4 10/21/2015 1047   CL 104 10/27/2015 0430   CO2 28 10/27/2015 0430   CO2 26 10/21/2015 1047   BUN 12 10/27/2015 0430   BUN 11.6 10/21/2015 1047   CREATININE 0.62 10/27/2015 0430   CREATININE 0.7 10/21/2015 1047      Component Value Date/Time   CALCIUM 8.8 (L) 10/27/2015 0430   CALCIUM 9.3 10/21/2015 1047   ALKPHOS 83 10/26/2015 0340   ALKPHOS 77 10/21/2015 1047   AST 28 10/26/2015 0340   AST 27 10/21/2015 1047   ALT 15 10/26/2015 0340   ALT 11 10/21/2015 1047   BILITOT 0.4 10/26/2015 0340   BILITOT <0.30 10/21/2015 1047       Results for CHRISTAIN, MCRANEY (MRN [id number]) as of 11/10/2015 13:16  Ref. Range 09/09/2015 09:24 09/29/2015 09:45 10/21/2015 10:47  Cancer Antigen (CA) 125 Latest Ref Range: 0.0 - 38.1 U/mL 120.7 (H) 82.3 (H) 60.5 (H)        Impression and Plan:  60 year old woman with the following issues:  1. Peritoneal carcinomatosis with omental involvement. The pathology confirmed the presence of adenocarcinoma of likely GYN etiology. Her CA 125 was elevated at 333.5 on 09/19/2012. She is status post systemic chemotherapy with an excellent response utilizing carboplatin and Taxotere and a Avastin . This therapy was held in June of 2015 and currently on Avastin maintenance.   CT scan obtained on 05/05/2015 as well as her tumor markers showed clear progression. Her CA-125 is up to 189 and her CT scan showed increase in her peritoneal disease.   She is currently receiving salvage therapy with carboplatin single agent at AUC of 5 with her tumor marker continue to respond at this time. Her CA-125 is down to 60 after it was a as high as 189 prior to therapy. Her last CT scan in August 2017 showed stable disease.  The risks and benefits of resuming chemotherapy was discussed today. Given her recent mild myocardial infarction she feels well and willing to proceed. Complications include worsening bleeding, worsening cardiac status were reviewed and she is willing to continue. I plan on repeating imaging studies in November 2017.    2. IV access: Port-A-Cath is utilized for chemotherapy without recent issues or complaints.  3. Thrombocytopenia: Her platelets count is stable without any bleeding. She does have baseline thrombocytopenia and maybe slightly exacerbated with carboplatin. Bleeding precautions were given to the patient.  4. Abdominal distention: No ascites noted on ultrasound unlikely related to peritoneal carcinomatosis. Clinically improving at this time.  5. Left breast mass: She status post biopsy with the pathology indicating T2 N1 disease with the tumor have ER positive, PR negative HER-2 negative. Her Ki-67 is 50%.  The plan is to treat with systemic chemotherapy and consider salvage mastectomy if she has a  good response to her ovarian cancer.  8. Neutropenia: Neulasta will be given after a cycles of therapy.  9. Genetic counseling: She has been evaluated for possible genetic disorder but none has been identified.  10. Myocardial infarction: She is receiving medical therapy and appears to be stable.  11. The followup: She will in 3 weeks for the next cycle of therapy.    Zola Button, MD 9/21/20171:38 PM

## 2015-11-11 ENCOUNTER — Ambulatory Visit: Payer: PPO

## 2015-11-11 ENCOUNTER — Other Ambulatory Visit: Payer: PPO

## 2015-11-11 ENCOUNTER — Ambulatory Visit: Payer: PPO | Admitting: Oncology

## 2015-11-11 LAB — CA 125: Cancer Antigen (CA) 125: 44.6 U/mL — ABNORMAL HIGH (ref 0.0–38.1)

## 2015-11-12 ENCOUNTER — Ambulatory Visit (HOSPITAL_BASED_OUTPATIENT_CLINIC_OR_DEPARTMENT_OTHER): Payer: PPO

## 2015-11-12 ENCOUNTER — Ambulatory Visit: Payer: PPO

## 2015-11-12 ENCOUNTER — Other Ambulatory Visit: Payer: Self-pay | Admitting: Student

## 2015-11-12 VITALS — BP 102/65 | HR 73 | Temp 98.3°F | Resp 16

## 2015-11-12 DIAGNOSIS — Z5189 Encounter for other specified aftercare: Secondary | ICD-10-CM

## 2015-11-12 DIAGNOSIS — C482 Malignant neoplasm of peritoneum, unspecified: Secondary | ICD-10-CM | POA: Diagnosis not present

## 2015-11-12 MED ORDER — CARVEDILOL 3.125 MG PO TABS: 1.5600 mg | ORAL_TABLET | Freq: Two times a day (BID) | ORAL | 3 refills | Status: DC

## 2015-11-12 MED ORDER — PEGFILGRASTIM INJECTION 6 MG/0.6ML ~~LOC~~
6.0000 mg | PREFILLED_SYRINGE | Freq: Once | SUBCUTANEOUS | Status: AC
  Administered 2015-11-12: 6 mg via SUBCUTANEOUS

## 2015-11-12 NOTE — Patient Instructions (Signed)
Pegfilgrastim injection What is this medicine? PEGFILGRASTIM (PEG fil gra stim) is a long-acting granulocyte colony-stimulating factor that stimulates the growth of neutrophils, a type of white blood cell important in the body's fight against infection. It is used to reduce the incidence of fever and infection in patients with certain types of cancer who are receiving chemotherapy that affects the bone marrow, and to increase survival after being exposed to high doses of radiation. This medicine may be used for other purposes; ask your health care provider or pharmacist if you have questions. What should I tell my health care provider before I take this medicine? They need to know if you have any of these conditions: -kidney disease -latex allergy -ongoing radiation therapy -sickle cell disease -skin reactions to acrylic adhesives (On-Body Injector only) -an unusual or allergic reaction to pegfilgrastim, filgrastim, other medicines, foods, dyes, or preservatives -pregnant or trying to get pregnant -breast-feeding How should I use this medicine? This medicine is for injection under the skin. If you get this medicine at home, you will be taught how to prepare and give the pre-filled syringe or how to use the On-body Injector. Refer to the patient Instructions for Use for detailed instructions. Use exactly as directed. Take your medicine at regular intervals. Do not take your medicine more often than directed. It is important that you put your used needles and syringes in a special sharps container. Do not put them in a trash can. If you do not have a sharps container, call your pharmacist or healthcare provider to get one. Talk to your pediatrician regarding the use of this medicine in children. While this drug may be prescribed for selected conditions, precautions do apply. Overdosage: If you think you have taken too much of this medicine contact a poison control center or emergency room at  once. NOTE: This medicine is only for you. Do not share this medicine with others. What if I miss a dose? It is important not to miss your dose. Call your doctor or health care professional if you miss your dose. If you miss a dose due to an On-body Injector failure or leakage, a new dose should be administered as soon as possible using a single prefilled syringe for manual use. What may interact with this medicine? Interactions have not been studied. Give your health care provider a list of all the medicines, herbs, non-prescription drugs, or dietary supplements you use. Also tell them if you smoke, drink alcohol, or use illegal drugs. Some items may interact with your medicine. This list may not describe all possible interactions. Give your health care provider a list of all the medicines, herbs, non-prescription drugs, or dietary supplements you use. Also tell them if you smoke, drink alcohol, or use illegal drugs. Some items may interact with your medicine. What should I watch for while using this medicine? You may need blood work done while you are taking this medicine. If you are going to need a MRI, CT scan, or other procedure, tell your doctor that you are using this medicine (On-Body Injector only). What side effects may I notice from receiving this medicine? Side effects that you should report to your doctor or health care professional as soon as possible: -allergic reactions like skin rash, itching or hives, swelling of the face, lips, or tongue -dizziness -fever -pain, redness, or irritation at site where injected -pinpoint red spots on the skin -red or dark-brown urine -shortness of breath or breathing problems -stomach or side pain, or pain   at the shoulder -swelling -tiredness -trouble passing urine or change in the amount of urine Side effects that usually do not require medical attention (report to your doctor or health care professional if they continue or are  bothersome): -bone pain -muscle pain This list may not describe all possible side effects. Call your doctor for medical advice about side effects. You may report side effects to FDA at 1-800-FDA-1088. Where should I keep my medicine? Keep out of the reach of children. Store pre-filled syringes in a refrigerator between 2 and 8 degrees C (36 and 46 degrees F). Do not freeze. Keep in carton to protect from light. Throw away this medicine if it is left out of the refrigerator for more than 48 hours. Throw away any unused medicine after the expiration date. NOTE: This sheet is a summary. It may not cover all possible information. If you have questions about this medicine, talk to your doctor, pharmacist, or health care provider.    2016, Elsevier/Gold Standard. (2014-02-25 14:30:14)  

## 2015-11-14 ENCOUNTER — Encounter: Payer: Self-pay | Admitting: Oncology

## 2015-11-14 ENCOUNTER — Ambulatory Visit: Payer: PPO

## 2015-11-15 ENCOUNTER — Telehealth: Payer: Self-pay | Admitting: Internal Medicine

## 2015-11-15 DIAGNOSIS — I214 Non-ST elevation (NSTEMI) myocardial infarction: Secondary | ICD-10-CM

## 2015-11-15 NOTE — Telephone Encounter (Signed)
F/u   [name] is also calling to see if its ok for her to have dental cleaning on 11/30/15 . Please call

## 2015-11-15 NOTE — Telephone Encounter (Signed)
New message       Pt signed up for cardiac rehab.  Orientation is scheduled for 12-08-15.  Will this be ok with Dr Harrington Challenger? If yes, please send order to cardiac rehab

## 2015-11-16 NOTE — Telephone Encounter (Signed)
Patient had DES placed on

## 2015-11-16 NOTE — Telephone Encounter (Signed)
OK to have routine dental cleaning done  Keep on ASA and Plavix  Let dentist know

## 2015-11-16 NOTE — Telephone Encounter (Signed)
Patient went to hospital with chest pain on 10/25/15, dx with NSTEMI, received DES.  Had re look cath for chest pain on 11/08/15.  Is on asa and Plavix.   Will send cardiac rehab orders, also called patient to inform that it will be ok for her to have her routine dental cleaning on 11/30/15 without stopping her asa and Plavix.  She is appreciative for the information provided.   Follow up with Dr. Harrington Challenger is 11/24/15

## 2015-11-17 ENCOUNTER — Other Ambulatory Visit: Payer: Self-pay

## 2015-11-17 ENCOUNTER — Ambulatory Visit (HOSPITAL_COMMUNITY): Payer: PPO | Attending: Cardiology

## 2015-11-17 ENCOUNTER — Encounter: Payer: Self-pay | Admitting: Physician Assistant

## 2015-11-17 DIAGNOSIS — I25118 Atherosclerotic heart disease of native coronary artery with other forms of angina pectoris: Secondary | ICD-10-CM

## 2015-11-17 DIAGNOSIS — I214 Non-ST elevation (NSTEMI) myocardial infarction: Secondary | ICD-10-CM

## 2015-11-17 NOTE — Telephone Encounter (Signed)
Left voicemail for patient informing her to let the dentist know that she had a stent placed in the artery around her heart.

## 2015-11-18 ENCOUNTER — Telehealth: Payer: Self-pay | Admitting: *Deleted

## 2015-11-18 NOTE — Telephone Encounter (Signed)
Pt is notified of echo results by phone with verbal understanding.

## 2015-11-24 ENCOUNTER — Ambulatory Visit (INDEPENDENT_AMBULATORY_CARE_PROVIDER_SITE_OTHER): Payer: PPO | Admitting: Internal Medicine

## 2015-11-24 ENCOUNTER — Encounter: Payer: Self-pay | Admitting: Internal Medicine

## 2015-11-24 DIAGNOSIS — I25118 Atherosclerotic heart disease of native coronary artery with other forms of angina pectoris: Secondary | ICD-10-CM

## 2015-11-24 MED ORDER — ISOSORBIDE MONONITRATE ER 30 MG PO TB24: 30.0000 mg | ORAL_TABLET | Freq: Every day | ORAL | 3 refills | Status: DC

## 2015-11-24 MED ORDER — NITROGLYCERIN 0.4 MG SL SUBL: 0.4000 mg | SUBLINGUAL_TABLET | SUBLINGUAL | 3 refills | Status: AC | PRN

## 2015-11-24 NOTE — Progress Notes (Signed)
Cardiology Office Note   Date:  11/24/2015   ID:  [name], DOB November 27, 1955, MRN [id number]  PCP:  London Pepper, MD  Cardiologist:   Dorris Carnes, MD    F?U of CAD     History of Present Illness: [name] is a 60 y.o. female with a history of  9/5-9/7 with non-STEMI. LHC demonstrated single vessel CAD with 99% distal LAD stenosis treated with Xience DES. EF was 35-45% at cardiac catheterization. Follow-up echo demonstrated normal LV function with any of [id number] and mild diastolic dysfunction.  She returns for FU.   Patient seen by Kathleen Argue since  Continued to have episoded discomfort  Like admit  Was at rest  NTG relieved  Repeat cath on 9.19 showed paent LAD stent and nonobstructive CAD elsewher  D/C home on 9/19    Still having intermitt arm discomfort  Takes NTG now  This weekend at [facility] in afternoon  Later that night got arm pain relieved by NTG   Seen by Dr Orland Mustard yesterday  Protonix discontinued    Continues with chemotherapy (suppressive Rx)       Outpatient Medications Prior to Visit  Medication Sig Dispense Refill  . Acetylcarnitine HCl (ACETYL L-CARNITINE PO) Take 2,000 mg by mouth at bedtime.    . ALPRAZolam (XANAX) 0.5 MG tablet Take 0.5 mg by mouth 2 (two) times daily as needed for anxiety.    Marland Kitchen aspirin 81 MG chewable tablet Chew 1 tablet (81 mg total) by mouth daily. 30 tablet 0  . atorvastatin (LIPITOR) 40 MG tablet Take 1 tablet (40 mg total) by mouth daily at 6 PM. 30 tablet 0  . carvedilol (COREG) 3.125 MG tablet Take 0.5 tablets (1.56 mg total) by mouth 2 (two) times daily with a meal. 30 tablet 3  . clopidogrel (PLAVIX) 75 MG tablet Take 1 tablet (75 mg total) by mouth daily with breakfast. 30 tablet 0  . divalproex (DEPAKOTE) 250 MG DR tablet Take 500-750 mg by mouth 2 (two) times daily. Take 500mg s in the morning and 750mg s at night    . fluticasone (FLONASE) 50 MCG/ACT nasal spray INHALE 2 SPRAYS INTO EACH NOSTRIL EVERY NIGHT  11  .  gabapentin (NEURONTIN) 100 MG capsule Take 1 capsule (100 mg total) by mouth 3 (three) times daily. (Patient taking differently: Take 100 mg by mouth 2 (two) times daily. ) 90 capsule 1  . isosorbide mononitrate (IMDUR) 30 MG 24 hr tablet Take 0.5 tablets (15 mg total) by mouth daily. 30 tablet 11  . lamoTRIgine (LAMICTAL) 100 MG tablet Take 100 mg by mouth 2 (two) times daily.    Marland Kitchen levothyroxine (SYNTHROID, LEVOTHROID) 100 MCG tablet Take 100 mcg by mouth daily before breakfast.     . lidocaine-prilocaine (EMLA) cream Apply topically as needed. Apply to port with every chemotherapy. 30 g 0  . lisinopril (PRINIVIL,ZESTRIL) 2.5 MG tablet Take 1 tablet (2.5 mg total) by mouth daily. 30 tablet 0  . loperamide (IMODIUM A-D) 2 MG tablet Take 2 mg by mouth as needed for diarrhea or loose stools.    Marland Kitchen LORazepam (ATIVAN) 1 MG tablet Take 1 tablet (1 mg total) by mouth every 6 (six) hours as needed for anxiety. 30 tablet 0  . nitroGLYCERIN (NITROSTAT) 0.4 MG SL tablet Place 1 tablet (0.4 mg total) under the tongue every 5 (five) minutes as needed for chest pain. 30 tablet 0  . ondansetron (ZOFRAN) 8 MG tablet TAKE 1 TABLET BY  MOUTH EVERY 8 HOURS AS NEEDED FOR NAUSEA AND VOMITING 20 tablet 1  . prochlorperazine (COMPAZINE) 10 MG tablet Take 1 tablet (10 mg total) by mouth every 6 (six) hours as needed for nausea or vomiting. 30 tablet 1  . rOPINIRole (REQUIP) 0.5 MG tablet Take 0.5-1 mg by mouth at bedtime. Take 1 to 3 hours before bedtime     . pantoprazole (PROTONIX) 40 MG tablet Take 1 tablet (40 mg total) by mouth daily. (Patient not taking: Reported on 11/24/2015) 30 tablet 0   Facility-Administered Medications Prior to Visit  Medication Dose Route Frequency Provider Last Rate Last Dose  . heparin lock flush 100 unit/mL  500 Units Intravenous Once Wyatt Portela, MD      . sodium chloride flush (NS) 0.9 % injection 10 mL  10 mL Intravenous PRN Wyatt Portela, MD         Allergies:   Doxil [doxorubicin  hcl liposomal] and Pollen extract   Past Medical History:  Diagnosis Date  . ADD (attention deficit disorder)   . Allergy    seasonal  . Anxiety   . Bipolar disorder ([place] [place])   . Breast cancer ([place]) 1989   right breast  . Depression   . Diabetes mellitus    pt denies DM noe meds  . Hematochezia   . History of echocardiogram    a. Limited Echo 9/17: EF 60-65%, no RWMA, Gr 1 DD, trivial AI, trivial MR, GLS -12% (likely underestimated), no pericardial effusion  . Hyperlipidemia   . Hypothyroidism   . Maintenance chemotherapy    Pt has chemo every 3 weeks (on Friday)  . Osteopenia 03/2009   t score -2.1 FRAX 4.6/0.4  . Premature menopause   . Sleep apnea    mild    Past Surgical History:  Procedure Laterality Date  . BREAST SURGERY  1989   RIGHT LUMPECTOMY, RADIATION AND CHEMO  . CARDIAC CATHETERIZATION N/A 10/26/2015   Procedure: Left Heart Cath and Coronary Angiography;  Surgeon: Wellington Hampshire, MD;  Location: [facility];  Service: Cardiovascular;  Laterality: N/A;  . CARDIAC CATHETERIZATION N/A 10/26/2015   Procedure: Coronary Stent Intervention;  Surgeon: Wellington Hampshire, MD;  Location: [place] [facility];  Service: Cardiovascular;  Laterality: N/A;  . CARDIAC CATHETERIZATION N/A 11/08/2015   Procedure: Left Heart Cath and Coronary Angiography;  Surgeon: Jolaine Artist, MD;  Location: [facility];  Service: Cardiovascular;  Laterality: N/A;  . FOOT SURGERY  2013   BILATERAL   . HYSTEROSCOPY  2011   [place]  . PELVIC LAPAROSCOPY/ Hysteroscopy  1996     Social History:  The patient  reports that she has never smoked. She has never used smokeless tobacco. She reports that she drinks alcohol. She reports that she does not use drugs.   Family History:  The patient's family history includes Allergies in her father; Breast cancer in her maternal aunt and maternal aunt; Breast cancer (age of onset: 48) in her maternal aunt; Cancer in her cousin and cousin; Diabetes  in her maternal grandmother; Heart Problems in her paternal grandfather; Heart disease in her maternal grandfather, maternal grandmother, and mother; Hypertension in her paternal grandfather; Liver cancer (age of onset: 58) in her cousin; Other in her mother.    ROS:  Please see the history of present illness. All other systems are reviewed and  Negative to the above problem except as noted.    PHYSICAL EXAM: VS:  BP 122/76  Pulse 63   Ht 5\' 2"  (1.575 m)   Wt 167 lb 3.2 oz (75.8 kg)   BMI 30.58 kg/m   GEN: Well nourished, well developed, in no acute distress  HEENT: normal  Neck: no JVD, carotid bruits, or masses Cardiac: RRR; no murmurs, rubs, or gallops,no edema  Respiratory:  clear to auscultation bilaterally, normal work of breathing GI: soft, nontender, nondistended, + BS  No hepatomegaly  MS: no deformity Moving all extremities   Skin: warm and dry, no rash Neuro:  Strength and sensation are intact Psych: euthymic mood, full affect   EKG:  EKG is  Not ordered today.   Lipid Panel    Component Value Date/Time   CHOL 161 10/26/2015 0340   TRIG 162 (H) 10/26/2015 0340   HDL 38 (L) 10/26/2015 0340   CHOLHDL 4.2 10/26/2015 0340   VLDL 32 10/26/2015 0340   LDLCALC 91 10/26/2015 0340   LDLDIRECT 151.3 08/24/2009 0803      Wt Readings from Last 3 Encounters:  11/24/15 167 lb 3.2 oz (75.8 kg)  11/10/15 166 lb 6.4 oz (75.5 kg)  11/08/15 170 lb (77.1 kg)      ASSESSMENT AND PLAN: 1  CAD  Pt with recurrent pain  It does not appear to be due to signif ischemia  Cath on 9/19 showed patent stent. Ehco with  LVEF normal  I would recomm increasing imdur to 30 mg   Will review echoes   2  Onc  COntinues on chemo Rx    Current medicines are reviewed at length with the patient today.  The patient does not have concerns regarding medicines.  Signed, Dorris Carnes, MD  11/24/2015 11:35 AM    [address], [place], [place]   [address] Phone: [phone]; Fax: [phone]

## 2015-11-24 NOTE — Patient Instructions (Signed)
Your physician has recommended you make the following change in your medication:  1.) INCREASE IMDUR (ISOSORBIDE) TO 30 MG ONCE A DAY (WHOLE PILL)  Your physician recommends that you schedule a follow-up appointment in: December, 2017 WITH DR. Harrington Challenger.

## 2015-11-28 DIAGNOSIS — F3131 Bipolar disorder, current episode depressed, mild: Secondary | ICD-10-CM | POA: Diagnosis not present

## 2015-12-01 ENCOUNTER — Ambulatory Visit (HOSPITAL_BASED_OUTPATIENT_CLINIC_OR_DEPARTMENT_OTHER): Payer: PPO

## 2015-12-01 ENCOUNTER — Telehealth: Payer: Self-pay | Admitting: Oncology

## 2015-12-01 ENCOUNTER — Other Ambulatory Visit (HOSPITAL_BASED_OUTPATIENT_CLINIC_OR_DEPARTMENT_OTHER): Payer: PPO

## 2015-12-01 ENCOUNTER — Ambulatory Visit (HOSPITAL_BASED_OUTPATIENT_CLINIC_OR_DEPARTMENT_OTHER): Payer: PPO | Admitting: Oncology

## 2015-12-01 ENCOUNTER — Ambulatory Visit: Payer: PPO

## 2015-12-01 VITALS — BP 109/68 | HR 68 | Temp 98.1°F | Resp 20 | Ht 62.0 in | Wt 162.5 lb

## 2015-12-01 DIAGNOSIS — C50912 Malignant neoplasm of unspecified site of left female breast: Secondary | ICD-10-CM

## 2015-12-01 DIAGNOSIS — Z5111 Encounter for antineoplastic chemotherapy: Secondary | ICD-10-CM | POA: Diagnosis not present

## 2015-12-01 DIAGNOSIS — C50919 Malignant neoplasm of unspecified site of unspecified female breast: Secondary | ICD-10-CM | POA: Diagnosis not present

## 2015-12-01 DIAGNOSIS — C482 Malignant neoplasm of peritoneum, unspecified: Secondary | ICD-10-CM

## 2015-12-01 DIAGNOSIS — Z95828 Presence of other vascular implants and grafts: Secondary | ICD-10-CM

## 2015-12-01 LAB — CBC WITH DIFFERENTIAL/PLATELET
BASO%: 0.3 % (ref 0.0–2.0)
BASOS ABS: 0 10*3/uL (ref 0.0–0.1)
EOS ABS: 0.1 10*3/uL (ref 0.0–0.5)
EOS%: 2 % (ref 0.0–7.0)
HEMATOCRIT: 29.3 % — AB (ref 34.8–46.6)
HEMOGLOBIN: 9.1 g/dL — AB (ref 11.6–15.9)
LYMPH%: 42.5 % (ref 14.0–49.7)
MCH: 31.8 pg (ref 25.1–34.0)
MCHC: 31.1 g/dL — AB (ref 31.5–36.0)
MCV: 102.4 fL — AB (ref 79.5–101.0)
MONO#: 0.4 10*3/uL (ref 0.1–0.9)
MONO%: 13.6 % (ref 0.0–14.0)
NEUT#: 1.2 10*3/uL — ABNORMAL LOW (ref 1.5–6.5)
NEUT%: 41.6 % (ref 38.4–76.8)
Platelets: 74 10*3/uL — ABNORMAL LOW (ref 145–400)
RBC: 2.86 10*6/uL — ABNORMAL LOW (ref 3.70–5.45)
RDW: 16.1 % — ABNORMAL HIGH (ref 11.2–14.5)
WBC: 2.9 10*3/uL — ABNORMAL LOW (ref 3.9–10.3)
lymph#: 1.3 10*3/uL (ref 0.9–3.3)

## 2015-12-01 LAB — COMPREHENSIVE METABOLIC PANEL
ALT: 8 U/L (ref 0–55)
AST: 13 U/L (ref 5–34)
Albumin: 3.4 g/dL — ABNORMAL LOW (ref 3.5–5.0)
Alkaline Phosphatase: 86 U/L (ref 40–150)
Anion Gap: 7 mEq/L (ref 3–11)
BUN: 13.4 mg/dL (ref 7.0–26.0)
CALCIUM: 9.2 mg/dL (ref 8.4–10.4)
CHLORIDE: 107 meq/L (ref 98–109)
CO2: 26 meq/L (ref 22–29)
CREATININE: 0.7 mg/dL (ref 0.6–1.1)
EGFR: >90 mL/min/{1.73_m2} (ref >90)
GLUCOSE: 85 mg/dL (ref 70–140)
Potassium: 4.6 mEq/L (ref 3.5–5.1)
SODIUM: 139 meq/L (ref 136–145)
Total Bilirubin: 0.28 mg/dL (ref 0.20–1.20)
Total Protein: 7.7 g/dL (ref 6.4–8.3)

## 2015-12-01 MED ORDER — PALONOSETRON HCL INJECTION 0.25 MG/5ML
INTRAVENOUS | Status: AC
  Filled 2015-12-01: qty 5

## 2015-12-01 MED ORDER — SODIUM CHLORIDE 0.9% FLUSH
10.0000 mL | INTRAVENOUS | Status: DC | PRN
  Administered 2015-12-01: 10 mL
  Filled 2015-12-01: qty 10

## 2015-12-01 MED ORDER — SODIUM CHLORIDE 0.9 % IJ SOLN
10.0000 mL | INTRAMUSCULAR | Status: AC | PRN
  Administered 2015-12-01: 10 mL
  Filled 2015-12-01: qty 10

## 2015-12-01 MED ORDER — SODIUM CHLORIDE 0.9 % IV SOLN
4.0000 mg | Freq: Once | INTRAVENOUS | Status: AC
  Administered 2015-12-01: 4 mg via INTRAVENOUS
  Filled 2015-12-01: qty 0.4

## 2015-12-01 MED ORDER — PALONOSETRON HCL INJECTION 0.25 MG/5ML
0.2500 mg | Freq: Once | INTRAVENOUS | Status: AC
  Administered 2015-12-01: 0.25 mg via INTRAVENOUS

## 2015-12-01 MED ORDER — SODIUM CHLORIDE 0.9 % IV SOLN
570.0000 mg | Freq: Once | INTRAVENOUS | Status: AC
  Administered 2015-12-01: 570 mg via INTRAVENOUS
  Filled 2015-12-01: qty 57

## 2015-12-01 MED ORDER — HEPARIN SOD (PORK) LOCK FLUSH 100 UNIT/ML IV SOLN
500.0000 [IU] | Freq: Once | INTRAVENOUS | Status: AC | PRN
  Administered 2015-12-01: 500 [IU]
  Filled 2015-12-01: qty 5

## 2015-12-01 MED ORDER — CARBOPLATIN CHEMO INTRADERMAL TEST DOSE 100MCG/0.02ML
100.0000 ug | Freq: Once | INTRADERMAL | Status: AC
Start: 2015-12-01 — End: 2015-12-01
  Administered 2015-12-01: 100 ug via INTRADERMAL
  Filled 2015-12-01: qty 0.02

## 2015-12-01 MED ORDER — SODIUM CHLORIDE 0.9 % IV SOLN
Freq: Once | INTRAVENOUS | Status: AC
  Administered 2015-12-01: 14:00:00 via INTRAVENOUS

## 2015-12-01 NOTE — Progress Notes (Signed)
Per Dr. Hazeline Junker office note, thrombocytopenia and neutropenia addressed/ok to treat.  Pt to get neulasta on 10/14.

## 2015-12-01 NOTE — Telephone Encounter (Signed)
GAVE PATIENT AVS REPORT AND APPOINTMENTS FOR October AND December

## 2015-12-01 NOTE — Patient Instructions (Signed)
[facility] Discharge Instructions for Patients Receiving Chemotherapy  Today you received the following chemotherapy agents carboplatin  To help prevent nausea and vomiting after your treatment, we encourage you to take your nausea medication as directed   If you develop nausea and vomiting that is not controlled by your nausea medication, call the clinic.   BELOW ARE SYMPTOMS THAT SHOULD BE REPORTED IMMEDIATELY:  *FEVER GREATER THAN 100.5 F  *CHILLS WITH OR WITHOUT FEVER  NAUSEA AND VOMITING THAT IS NOT CONTROLLED WITH YOUR NAUSEA MEDICATION  *UNUSUAL SHORTNESS OF BREATH  *UNUSUAL BRUISING OR BLEEDING  TENDERNESS IN MOUTH AND THROAT WITH OR WITHOUT PRESENCE OF ULCERS  *URINARY PROBLEMS  *BOWEL PROBLEMS  UNUSUAL RASH Items with * indicate a potential emergency and should be followed up as soon as possible.  Feel free to call the clinic you have any questions or concerns. The clinic phone number is [phone].  

## 2015-12-01 NOTE — Progress Notes (Signed)
Hematology and Oncology Follow Up Visit  [name] [id number] June 14, 1955 60 y.o. 12/01/2015 12:28 PM    Principle Diagnosis: 60 year old woman diagnosed with:  1. 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 This was diagnosed in July of 2014. 2. 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 The tumor is ER positive PR negative, HER-2 negative.   Prior Therapy:  She is status post paracentesis performed on 09/17/2012 with the cytology confirmed the presence of adenocarcinoma and immunohistochemical stains suggest GYN etiology. She is also status post lumpectomy for breast cancer diagnosed 25 years ago followed by radiation and chemotherapy under the care of Dr. Sonny Dandy. She did not receive any hormonal therapy. Chemotherapy utilizing carboplatin and Taxotere started on 10/01/2012. Avastin was added with cycle 4. This was discontinued in June of 2015. She is S/P Avastin maintenance only between June 2015 till March 2017. Therapy discontinued due to progression of disease. She is status post Doxil salvage chemotherapy therapy discontinued because of hypersensitivity reaction in March 2017.  Current therapy:  Carboplatin and AUC of 5 every 3 weeks cycle 1 started on 07/08/2015. She is here for cycle 8 of therapy.  Interim History: [name] presents today for a followup visit with her husband. Since her last visit, she reported slight increase in her nausea associated with carboplatin therapy. Her antiemetics were adjusted including Aloxi and dexamethasone with improvement in her symptoms. She does use Zofran on the day of her chemotherapy to prevent any further nausea.   Overall her performance status and activity level continues to improve. She remains active and will participate in cardiac rehabilitation starting next week. She intentionally lost some weight  and feeling well overall.  She continues to tolerate chemotherapy without any other complications. She denied any infusion-related issues including anaphylaxis or hives.  She does not report any headaches, blurry vision, syncope or seizures. She denied any fever chills. She does report some chest discomfort but no dyspnea on exertion. Has not reported any shortness of breath or cough. She denied any pain with swallowing. She does not report any vomiting, constipation does report some occasional diarrhea. She denied any GYN bleeding. She reports no anxiety or depression. Remainder of the review of systems is unremarkable.  Medications: I have reviewed the patient's current medications.  Current Outpatient Prescriptions  Medication Sig Dispense Refill  . Acetylcarnitine HCl (ACETYL L-CARNITINE PO) Take 2,000 mg by mouth at bedtime.    . ALPRAZolam (XANAX) 0.5 MG tablet Take 0.5 mg by mouth 2 (two) times daily as needed for anxiety.    Marland Kitchen aspirin 81 MG chewable tablet Chew 1 tablet (81 mg total) by mouth daily. 30 tablet 0  . atorvastatin (LIPITOR) 40 MG tablet Take 1 tablet (40 mg total) by mouth daily at 6 PM. 30 tablet 0  . carvedilol (COREG) 3.125 MG tablet Take 0.5 tablets (1.56 mg total) by mouth 2 (two) times daily with a meal. 30 tablet 3  . clopidogrel (PLAVIX) 75 MG tablet Take 1 tablet (75 mg total) by mouth daily with breakfast. 30 tablet 0  . divalproex (DEPAKOTE) 250 MG DR tablet Take 500-750 mg by mouth 2 (two) times daily. Take '500mg'$ s in the morning and '750mg'$ s at night    . fluticasone (FLONASE) 50 MCG/ACT nasal spray INHALE 2 SPRAYS INTO EACH NOSTRIL EVERY NIGHT  11  . gabapentin (NEURONTIN) 100 MG capsule Take  1 capsule (100 mg total) by mouth 3 (three) times daily. (Patient taking differently: Take 100 mg by mouth 2 (two) times daily. ) 90 capsule 1  . isosorbide mononitrate (IMDUR) 30 MG 24 hr tablet Take 1 tablet (30 mg total) by mouth daily. 90 tablet 3  . lamoTRIgine (LAMICTAL)  100 MG tablet Take 100 mg by mouth 2 (two) times daily.    Marland Kitchen levothyroxine (SYNTHROID, LEVOTHROID) 100 MCG tablet Take 100 mcg by mouth daily before breakfast.     . lidocaine-prilocaine (EMLA) cream Apply topically as needed. Apply to port with every chemotherapy. 30 g 0  . lisinopril (PRINIVIL,ZESTRIL) 2.5 MG tablet Take 1 tablet (2.5 mg total) by mouth daily. 30 tablet 0  . loperamide (IMODIUM A-D) 2 MG tablet Take 2 mg by mouth as needed for diarrhea or loose stools.    Marland Kitchen LORazepam (ATIVAN) 1 MG tablet Take 1 tablet (1 mg total) by mouth every 6 (six) hours as needed for anxiety. 30 tablet 0  . nitroGLYCERIN (NITROSTAT) 0.4 MG SL tablet Place 1 tablet (0.4 mg total) under the tongue every 5 (five) minutes as needed for chest pain. 25 tablet 3  . ondansetron (ZOFRAN) 8 MG tablet TAKE 1 TABLET BY MOUTH EVERY 8 HOURS AS NEEDED FOR NAUSEA AND VOMITING 20 tablet 1  . prochlorperazine (COMPAZINE) 10 MG tablet Take 1 tablet (10 mg total) by mouth every 6 (six) hours as needed for nausea or vomiting. 30 tablet 1  . rOPINIRole (REQUIP) 0.5 MG tablet Take 0.5-1 mg by mouth at bedtime. Take 1 to 3 hours before bedtime      No current facility-administered medications for this visit.    Facility-Administered Medications Ordered in Other Visits  Medication Dose Route Frequency Provider Last Rate Last Dose  . heparin lock flush 100 unit/mL  500 Units Intravenous Once Wyatt Portela, MD      . sodium chloride flush (NS) 0.9 % injection 10 mL  10 mL Intravenous PRN Wyatt Portela, MD         Allergies:  Allergies  Allergen Reactions  . Doxil [Doxorubicin Hcl Liposomal] Anaphylaxis    1st Doxil.   . Pollen Extract Other (See Comments)    Pollen and grass causes a lot sneezing    Past Medical History, Surgical history, Social history, and Family History were reviewed and updated.   Physical Exam: Blood pressure 109/68, pulse 68, temperature 98.1 F (36.7 C), temperature source Oral, resp. rate  20, height '5\' 2"'$  (1.575 m), weight 162 lb 8 oz (73.7 kg), SpO2 100 %.  ECOG: 1 General appearance: Well-appearing woman without distress. Head: Normocephalic, without obvious abnormality. No oral ulcers or lesions. Neck: no adenopathy Lymph nodes: Cervical, supraclavicular, and axillary nodes normal. Heart:regular rate and rhythm, S1, S2 normal, no murmur, click, rub or gallop Lung:chest clear, no wheezing, rales, normal symmetric air entry Abdomin: Soft, nontender with good bowel sounds. No rebound or guarding. EXT:no erythema, induration, or nodules. No edema. Skin: No rashes or lesions. No ecchymosis noted.  Lab Results: Lab Results  Component Value Date   WBC 2.9 (L) 12/01/2015   HGB 9.1 (L) 12/01/2015   HCT 29.3 (L) 12/01/2015   MCV 102.4 (H) 12/01/2015   PLT 74 (L) 12/01/2015     Chemistry      Component Value Date/Time   NA 139 11/10/2015 1245   K 4.4 11/10/2015 1245   CL 104 10/27/2015 0430   CO2 26 11/10/2015 1245   BUN 17.0  11/10/2015 1245   CREATININE 0.7 11/10/2015 1245      Component Value Date/Time   CALCIUM 9.5 11/10/2015 1245   ALKPHOS 89 11/10/2015 1245   AST 15 11/10/2015 1245   ALT <9 11/10/2015 1245   BILITOT 0.30 11/10/2015 1245         Results for [name], [name] (MRN [id number]) as of 12/01/2015 12:10  Ref. Range 07/29/2015 11:16 08/19/2015 09:03 09/09/2015 09:24 09/29/2015 09:45 10/21/2015 10:47 11/10/2015 12:45  Cancer Antigen (CA) 125 Latest Ref Range: 0.0 - 38.1 U/mL 189.8 (H) 177.7 (H) 120.7 (H) 82.3 (H) 60.5 (H) 44.6 (H)      Impression and Plan:  60 year old woman with the following issues:  1. Peritoneal carcinomatosis with omental involvement. The pathology confirmed the presence of adenocarcinoma of likely GYN etiology. Her CA 125 was elevated at 333.5 on 09/19/2012. She is status post systemic chemotherapy with an excellent response utilizing carboplatin and Taxotere and a Avastin . This therapy was held in June of 2015 and currently  on Avastin maintenance.   CT scan obtained on 05/05/2015 as well as her tumor markers showed clear progression. Her CA-125 is up to 189 and her CT scan showed increase in her peritoneal disease.   She is currently receiving salvage therapy with carboplatin single agent at AUC of 5 with her tumor marker continue to respond at this time. Her CA-125 is down to 44.6 after it was a as high as 189 prior to therapy. Her last CT scan in August 2017 showed stable disease.  She is agreeable to continue with systemic therapy at this time and ready to proceed. The plan is to repeat imaging studies in November 2017.    2. IV access: Port-A-Cath is utilized for chemotherapy without recent issues or complaints.  3. Thrombocytopenia: Her platelets count improved since the last visit without any bleeding. No intervention needed at this time.  4. Abdominal distention: No ascites noted on ultrasound unlikely related to peritoneal carcinomatosis. Clinically improving at this time.  5. Left breast mass: She status post biopsy with the pathology indicating T2 N1 disease with the tumor have ER positive, PR negative HER-2 negative. Her Ki-67 is 50%.  The plan is to treat with systemic chemotherapy and consider salvage mastectomy if she has a good response to her ovarian cancer. Repeat mammography will be done in the future.  8. Neutropenia: Neulasta will be given after a cycles of therapy.  9. Genetic counseling: She has been evaluated for possible genetic disorder but none has been identified.  10. Myocardial infarction: She is receiving medical therapy and appears to be stable. She will receiving cardiac rehabilitation in the near future.  11. The followup: She will in 3 weeks for the next cycle of therapy.    MHWKGS,UPJSR, MD 10/12/201712:28 PM

## 2015-12-02 ENCOUNTER — Telehealth: Payer: Self-pay | Admitting: Internal Medicine

## 2015-12-02 ENCOUNTER — Other Ambulatory Visit: Payer: Self-pay

## 2015-12-02 LAB — CA 125: Cancer Antigen (CA) 125: 41.4 U/mL — ABNORMAL HIGH (ref 0.0–38.1)

## 2015-12-02 NOTE — Patient Outreach (Signed)
[facility]) Care Management  12/02/2015  Chance [birth year] [id number]   Telephone Screen  Referral Date: 12/01/15 Referral Source: [facility] Referral Reason: " ongoing education and disease management, patient request blood pressure cuff"    Outreach attempt # 1 to patient. No answer at present. RN CM left HIPAA compliant voicemail message along with contact info.     Plan: RN CM will make outreach attempt to patient within a week if no return call from patient.    Enzo Montgomery, RN,BSN,CCM [place] [facility] Telephonic Care Management Coordinator Direct Phone: [phone] Toll Free: [phone] Fax: [phone]

## 2015-12-02 NOTE — Telephone Encounter (Signed)
Patient reports increased acid reflux. She states she would like refill on pantoprazole. According to office note 11/24/15, she denied taking pantoprazole. Please advise.

## 2015-12-02 NOTE — Telephone Encounter (Signed)
Follow Up:    Pt wants to know if Dr Harrington Challenger have decided whether she needs to continue taking Pantoprazole? Pt says she is still having heart burn.

## 2015-12-03 ENCOUNTER — Ambulatory Visit (HOSPITAL_BASED_OUTPATIENT_CLINIC_OR_DEPARTMENT_OTHER): Payer: PPO

## 2015-12-03 VITALS — BP 119/49 | HR 65 | Temp 98.4°F | Resp 17

## 2015-12-03 DIAGNOSIS — C482 Malignant neoplasm of peritoneum, unspecified: Secondary | ICD-10-CM

## 2015-12-03 MED ORDER — PEGFILGRASTIM INJECTION 6 MG/0.6ML ~~LOC~~
6.0000 mg | PREFILLED_SYRINGE | Freq: Once | SUBCUTANEOUS | Status: AC
  Administered 2015-12-03: 6 mg via SUBCUTANEOUS

## 2015-12-03 NOTE — Patient Instructions (Signed)
Neutropenia Neutropenia is a condition that occurs when the level of a certain type of white blood cell (neutrophil) in your body becomes lower than normal. Neutrophils are made in the bone marrow and fight infections. These cells protect against bacteria and viruses. The fewer neutrophils you have, and the longer your body remains without them, the greater your risk of getting a severe infection becomes. CAUSES  The cause of neutropenia may be hard to determine. However, it is usually due to 3 main problems:   Decreased production of neutrophils. This may be due to:  Certain medicines such as chemotherapy.  Genetic problems.  Cancer.  Radiation treatments.  Vitamin deficiency.  Some pesticides.  Increased destruction of neutrophils. This may be due to:  Overwhelming infections.  Hemolytic anemia. This is when the body destroys its own blood cells.  Chemotherapy.  Neutrophils moving to areas of the body where they cannot fight infections. This may be due to:  Dialysis procedures.  Conditions where the spleen becomes enlarged. Neutrophils are held in the spleen and are not available to the rest of the body.  Overwhelming infections. The neutrophils are held in the area of the infection and are not available to the rest of the body. SYMPTOMS  There are no specific symptoms of neutropenia. The lack of neutrophils can result in an infection, and an infection can cause various problems. DIAGNOSIS  Diagnosis is made by a blood test. A complete blood count is performed. The normal level of neutrophils in human blood differs with age and race. Infants have lower counts than older children and adults. African Americans have lower counts than Caucasians or Asians. The average adult level is 1500 cells/mm3 of blood. Neutrophil counts are interpreted as follows:  Greater than 1000 cells/mm3 gives normal protection against infection.  500 to 1000 cells/mm3 gives an increased risk for  infection.  200 to 500 cells/mm3 is a greater risk for severe infection.  Lower than 200 cells/mm3 is a marked risk of infection. This may require hospitalization and treatment with antibiotic medicines. TREATMENT  Treatment depends on the underlying cause, severity, and presence of infections or symptoms. It also depends on your health. Your caregiver will discuss the treatment plan with you. Mild cases are often easily treated and have a good outcome. Preventative measures may also be started to limit your risk of infections. Treatment can include:  Taking antibiotics.  Stopping medicines that are known to cause neutropenia.  Correcting nutritional deficiencies by eating green vegetables to supply folic acid and taking vitamin B supplements.  Stopping exposure to pesticides if your neutropenia is related to pesticide exposure.  Taking a blood growth factor called sargramostim, pegfilgrastim, or filgrastim if you are undergoing chemotherapy for cancer. This stimulates white blood cell production.  Removal of the spleen if you have Felty's syndrome and have repeated infections. HOME CARE INSTRUCTIONS   Follow your caregiver's instructions about when you need to have blood work done.  Wash your hands often. Make sure others who come in contact with you also wash their hands.  Wash raw fruits and vegetables before eating them. They can carry bacteria and fungi.  Avoid people with colds or spreadable (contagious) diseases (chickenpox, herpes zoster, influenza).  Avoid large crowds.  Avoid construction areas. The dust can release fungus into the air.  Be cautious around children in daycare or school environments.  Take care of your respiratory system by coughing and deep breathing.  Bathe daily.  Protect your skin from cuts and   burns.  Do not work in the garden or with flowers and plants.  Care for the mouth before and after meals by brushing with a soft toothbrush. If you have  mucositis, do not use mouthwash. Mouthwash contains alcohol and can dry out the mouth even more.  Clean the area between the genitals and the anus (perineal area) after urination and bowel movements. Women need to wipe from front to back.  Use a water soluble lubricant during sexual intercourse and practice good hygiene after. Do not have intercourse if you are severely neutropenic. Check with your caregiver for guidelines.  Exercise daily as tolerated.  Avoid people who were vaccinated with a live vaccine in the past 30 days. You should not receive live vaccines (polio, typhoid).  Do not provide direct care for pets. Avoid animal droppings. Do not clean litter boxes and bird cages.  Do not share food utensils.  Do not use tampons, enemas, or rectal suppositories unless directed by your caregiver.  Use an electric razor to remove hair.  Wash your hands after handling magazines, letters, and newspapers. SEEK IMMEDIATE MEDICAL CARE IF:   You have a fever.  You have chills or start to shake.  You feel nauseous or vomit.  You develop mouth sores.  You develop aches and pains.  You have redness and swelling around open wounds.  Your skin is warm to the touch.  You have pus coming from your wounds.  You develop swollen lymph nodes.  You feel weak or fatigued.  You develop red streaks on the skin. MAKE SURE YOU:  Understand these instructions.  Will watch your condition.  Will get help right away if you are not doing well or get worse.   This information is not intended to replace advice given to you by your health care provider. Make sure you discuss any questions you have with your health care provider.   Document Released: 07/28/2001 Document Revised: 04/30/2011 Document Reviewed: 08/18/2014 Elsevier Interactive Patient Education 2016 Elsevier Inc.  

## 2015-12-04 NOTE — Telephone Encounter (Signed)
OK to take protonix daily

## 2015-12-05 MED ORDER — PANTOPRAZOLE SODIUM 40 MG PO TBEC: 40.0000 mg | DELAYED_RELEASE_TABLET | Freq: Every day | ORAL | 11 refills | Status: DC

## 2015-12-05 NOTE — Telephone Encounter (Signed)
Left message that prescription for Protonix was sent to [facility] and she can pick up at her convenience.

## 2015-12-07 ENCOUNTER — Other Ambulatory Visit: Payer: Self-pay

## 2015-12-07 NOTE — Patient Outreach (Signed)
[facility]) Care Management  12/07/2015  Bib[place] 1955-07-18 [id number]   Telephone Screen  Referral Date: 12/01/15 Referral Source: [facility]-HTA Referral Reason: " ongoing education and disease management, patient request blood pressure cuff"    Outreach attempt #2 to patient. No answer at present. RN CM left HIPAA compliant voicemail message along with contact info.     Plan: RN CM will make outreach attempt to patient within a week.   Enzo Montgomery, RN,BSN,CCM [place] [facility] Telephonic Care Management Coordinator Direct Phone: [phone] Toll Free: [phone] Fax: [phone]

## 2015-12-07 NOTE — Patient Outreach (Signed)
[facility]) Care Management  12/07/2015  [place] 1955-04-03 [id number]   Telephone Screen  Referral Date: 12/01/15 Referral Source: [facility]-HTA Referral Reason: " ongoing education and disease management, patient request blood pressure cuff"   Voicemail message received from patient. Return call placed to patient. Patient reached and screening completed.  Social: Patient resides in her home along with spouse. She is independent with ADLs/IADLs. She drives herself to most MD appts except for chemo txs. She denies any recent falls in the home. No DME per patient report.  Conditions: Patient has h/o peritoneal carcinomatosis adenocarcinoma of GYN etiology(dx in July 2014), left breast cancer(may 2017) currently undergoing chemo txs q3 wks. She also suffers from HLD,CAD, EF 35-45%, ADD, bipolar and depression. Patient states that her BP is normal and denies any issues with BP. She reports that when its checked at MD office its in the 120s. She does not have a monitor but had already planned to purchase one later this week. She reports that she goes q3 wks for chemo txs. Patient states she had been tolerating txs well but now is starting to have some wgt loss, decreased appetite and nausea. She was recently started on med to help relieve symptoms. Patient states she has scale in the home and is monitoring weight daily. Denies any issues with edema and/or swelling. She reports that she is to being cardiac rehab on tomorrow(12/08/15) and feels like this will help a lot of her medical issues.  Medications: Patient states she is taking 20 meds. Denies any issues with affording meds at this time. She voices that she thinks some of her meds are interacting with chemo that she is taking. She reports that since she was recently started on cardiac med(pt unable to name med) that she has been having nausea and more trouble tolerating chemo.  Appointments: Patient states she is  switching PCPs. She will no longer be seeing Dr. Orland Mustard. She is changing to Dr. Betty [name] with [place] [facility] and has appt on 12/09/15. She sees oncologist(Dr. Josepha Pigg seen on 12/01/15 and cardiologist(Dr. Nevin Bloodgood Ross)-last seen on 11/24/15. She also reports that she sees therapist for bipolar and depression weekly.   Advance Directives: Patient states she has completed living will.  Consent: [facility] services reviewed and discussed. Patient declined [facility] RN services. She voices that she and her spouse are not comfortable with a stranger/nurse coming into their home. She reports that she will be able to manage and check her BP in the home independently. She states she doesn't feel like telephonic nurse is necessary. Patient kept reiterating how she is starting cardiac rehab and feels this will help with a lot of medical issues. Patient was agreeable to [place] referral for polypharmacy med review only.      Plan: RN CM will notify [facility] administrative assistant of case status. RN CM will send [facility] referral for polypharmacy med review and med issues/concerns as voiced by patient.  Enzo Montgomery, RN,BSN,CCM [place] [facility] Telephonic Care Management Coordinator Direct Phone: [phone] Toll Free: [phone] Fax: [phone]

## 2015-12-08 ENCOUNTER — Telehealth (HOSPITAL_COMMUNITY): Payer: Self-pay | Admitting: *Deleted

## 2015-12-08 ENCOUNTER — Inpatient Hospital Stay (HOSPITAL_COMMUNITY): Admission: RE | Admit: 2015-12-08 | Payer: PPO | Source: Ambulatory Visit

## 2015-12-08 ENCOUNTER — Encounter (HOSPITAL_COMMUNITY)
Admission: RE | Admit: 2015-12-08 | Discharge: 2015-12-08 | Disposition: A | Discharge status: Home / Self Care | Payer: PPO | Source: Ambulatory Visit | Attending: Internal Medicine | Admitting: Internal Medicine

## 2015-12-08 VITALS — BP 104/60 | HR 79 | Ht 61.0 in | Wt 164.7 lb

## 2015-12-08 DIAGNOSIS — Z79899 Other long term (current) drug therapy: Secondary | ICD-10-CM | POA: Insufficient documentation

## 2015-12-08 DIAGNOSIS — Z955 Presence of coronary angioplasty implant and graft: Secondary | ICD-10-CM | POA: Diagnosis not present

## 2015-12-08 DIAGNOSIS — I214 Non-ST elevation (NSTEMI) myocardial infarction: Secondary | ICD-10-CM

## 2015-12-08 DIAGNOSIS — Z7982 Long term (current) use of aspirin: Secondary | ICD-10-CM | POA: Insufficient documentation

## 2015-12-08 HISTORY — DX: Non-ST elevation (NSTEMI) myocardial infarction: I21.4

## 2015-12-08 HISTORY — DX: Atherosclerotic heart disease of native coronary artery without angina pectoris: I25.10

## 2015-12-08 NOTE — Telephone Encounter (Signed)
-----   Message from Wyatt Portela, MD sent at 12/08/2015  3:07 PM EDT ----- Regarding: RE: Ok for pt to participate in cardiac rehab No objection.  ----- Message ----- From: Rowe Pavy, RN Sent: 12/08/2015   2:18 PM To: Wyatt Portela, MD Subject: [facility] for pt to participate in cardiac rehab      Dr. Alen Blew  The above pt is eligible to participate in cardiac rehab s/p 10/2015 NSTEMI and stent placement. May pt participate in group exercise here at cardiac rehab?  If so, any restrictions?  Thank you so much Maurice Small RN

## 2015-12-08 NOTE — Progress Notes (Signed)
Cardiac Rehab Medication Review by a Pharmacist  Does the patient  feel that his/her medications are working for him/her?  yes  Has the patient been experiencing any side effects to the medications prescribed?  Yes, nausea  Does the patient measure his/her own blood pressure or blood glucose at home?  no   Does the patient have any problems obtaining medications due to transportation or finances?   Yes, financial  Understanding of regimen: good Understanding of indications: good Potential of compliance: good    Pharmacist comments: Pt presents today for initial cardiac rehab appointment. Pt reports ongoing nausea over the past few months, but is unsure whether this is related to chemo or new cardiac medicines. In addition, pt reports recent constipation with a little bit of bright red blood in stool this morning. Counseled pt that this is likely secondary to straining, constipation, and possible hemorrhoid, but advised to let MD know regardless and seek treatment if it gets worse or does not resolve. Pt also reports that she has had some difficulty affording all of her new medicines, but has been able to obtain all of them so far. Pt reports she has been working with her insurance company to get assistance for this. No other issues noted at this time.  Arrie Senate, PharmD PGY-1 Pharmacy Resident Pager: [phone] 12/08/2015

## 2015-12-09 ENCOUNTER — Ambulatory Visit (INDEPENDENT_AMBULATORY_CARE_PROVIDER_SITE_OTHER): Payer: PPO | Admitting: Family Medicine

## 2015-12-09 ENCOUNTER — Encounter (HOSPITAL_COMMUNITY): Payer: Self-pay

## 2015-12-09 ENCOUNTER — Encounter: Payer: Self-pay | Admitting: Family Medicine

## 2015-12-09 VITALS — BP 126/78 | HR 91 | Resp 12 | Ht 61.0 in | Wt 163.0 lb

## 2015-12-09 DIAGNOSIS — K219 Gastro-esophageal reflux disease without esophagitis: Secondary | ICD-10-CM

## 2015-12-09 DIAGNOSIS — Z23 Encounter for immunization: Secondary | ICD-10-CM

## 2015-12-09 DIAGNOSIS — E039 Hypothyroidism, unspecified: Secondary | ICD-10-CM | POA: Diagnosis not present

## 2015-12-09 DIAGNOSIS — I2510 Atherosclerotic heart disease of native coronary artery without angina pectoris: Secondary | ICD-10-CM

## 2015-12-09 NOTE — Progress Notes (Signed)
Cardiac Individual Treatment Plan  Patient Details  Name: [name] MRN: [id number] Date of Birth: 14-Sep-1955 Referring Provider:   Flowsheet Row CARDIAC REHAB PHASE II ORIENTATION from 12/08/2015 in [facility]  Referring Provider  Dorris Carnes MD      Initial Encounter Date:  [facility] PHASE II ORIENTATION from 12/08/2015 in [facility]  Date  12/08/15  Referring Provider  Dorris Carnes MD      Visit Diagnosis: 10/26/15 NSTEMI (non-ST elevated myocardial infarction) ([place])  10/26/15 Status post coronary artery stent placement  Patient's Home Medications on Admission:  Current Outpatient Prescriptions:  .  Acetylcarnitine HCl (ACETYL L-CARNITINE PO), Take 2,000 mg by mouth at bedtime., Disp: , Rfl:  .  ALPRAZolam (XANAX) 0.5 MG tablet, Take 0.5 mg by mouth 2 (two) times daily as needed for anxiety., Disp: , Rfl:  .  aspirin 81 MG chewable tablet, Chew 1 tablet (81 mg total) by mouth daily., Disp: 30 tablet, Rfl: 0 .  atorvastatin (LIPITOR) 40 MG tablet, Take 1 tablet (40 mg total) by mouth daily at 6 PM., Disp: 30 tablet, Rfl: 0 .  carvedilol (COREG) 3.125 MG tablet, Take 0.5 tablets (1.56 mg total) by mouth 2 (two) times daily with a meal., Disp: 30 tablet, Rfl: 3 .  clopidogrel (PLAVIX) 75 MG tablet, Take 1 tablet (75 mg total) by mouth daily with breakfast., Disp: 30 tablet, Rfl: 0 .  divalproex (DEPAKOTE) 250 MG DR tablet, Take by mouth. Take 500mg s in the morning and 750mg s at night, Disp: , Rfl:  .  fluticasone (FLONASE) 50 MCG/ACT nasal spray, INHALE 2 SPRAYS INTO EACH NOSTRIL EVERY NIGHT, Disp: , Rfl: 11 .  gabapentin (NEURONTIN) 100 MG capsule, Take 1 capsule (100 mg total) by mouth 3 (three) times daily. (Patient taking differently: Take 100 mg by mouth 2 (two) times daily. ), Disp: 90 capsule, Rfl: 1 .  isosorbide mononitrate (IMDUR) 30 MG 24 hr tablet, Take 1 tablet (30 mg total) by mouth  daily., Disp: 90 tablet, Rfl: 3 .  lamoTRIgine (LAMICTAL) 100 MG tablet, Take 100 mg by mouth 2 (two) times daily., Disp: , Rfl:  .  levothyroxine (SYNTHROID, LEVOTHROID) 100 MCG tablet, Take 100 mcg by mouth daily before breakfast. , Disp: , Rfl:  .  lidocaine-prilocaine (EMLA) cream, Apply topically as needed. Apply to port with every chemotherapy., Disp: 30 g, Rfl: 0 .  lisinopril (PRINIVIL,ZESTRIL) 2.5 MG tablet, Take 1 tablet (2.5 mg total) by mouth daily., Disp: 30 tablet, Rfl: 0 .  loperamide (IMODIUM A-D) 2 MG tablet, Take 2 mg by mouth as needed for diarrhea or loose stools., Disp: , Rfl:  .  loratadine (CLARITIN) 10 MG tablet, Take 10 mg by mouth daily as needed for allergies (with chemo)., Disp: , Rfl:  .  nitroGLYCERIN (NITROSTAT) 0.4 MG SL tablet, Place 1 tablet (0.4 mg total) under the tongue every 5 (five) minutes as needed for chest pain., Disp: 25 tablet, Rfl: 3 .  ondansetron (ZOFRAN) 8 MG tablet, TAKE 1 TABLET BY MOUTH EVERY 8 HOURS AS NEEDED FOR NAUSEA AND VOMITING, Disp: 20 tablet, Rfl: 1 .  pantoprazole (PROTONIX) 40 MG tablet, Take 1 tablet (40 mg total) by mouth daily., Disp: 30 tablet, Rfl: 11 .  rOPINIRole (REQUIP) 0.5 MG tablet, Take 1.5 mg by mouth at bedtime. , Disp: , Rfl:  .  LORazepam (ATIVAN) 1 MG tablet, Take 1 tablet (1 mg total) by mouth every 6 (  six) hours as needed for anxiety., Disp: 30 tablet, Rfl: 0 .  prochlorperazine (COMPAZINE) 10 MG tablet, Take 1 tablet (10 mg total) by mouth every 6 (six) hours as needed for nausea or vomiting., Disp: 30 tablet, Rfl: 1 No current facility-administered medications for this encounter.   Facility-Administered Medications Ordered in Other Encounters:  .  heparin lock flush 100 unit/mL, 500 Units, Intravenous, Once, Wyatt Portela, MD .  sodium chloride flush (NS) 0.9 % injection 10 mL, 10 mL, Intravenous, PRN, Wyatt Portela, MD  Past Medical History: Past Medical History:  Diagnosis Date  . ADD (attention deficit  disorder)   . Allergy    seasonal  . Anxiety   . Bipolar disorder ([place])   . Breast cancer ([place]) 1989   right breast  . Coronary artery disease   . Depression   . Diabetes mellitus    pt denies DM noe meds  . Hematochezia   . History of echocardiogram    a. Limited Echo 9/17: EF 60-65%, no RWMA, Gr 1 DD, trivial AI, trivial MR, GLS -12% (likely underestimated), no pericardial effusion  . Hyperlipidemia   . Hypothyroidism   . Maintenance chemotherapy    Pt has chemo every 3 weeks (on Friday)  . NSTEMI (non-ST elevated myocardial infarction) ([place]) 10/26/2015  . Osteopenia 03/2009   t score -2.1 FRAX 4.6/0.4  . Premature menopause   . Sleep apnea    mild    Tobacco Use: History  Smoking Status  . Never Smoker  Smokeless Tobacco  . Never Used    Labs: Recent Review Flowsheet Data    Labs for ITP Cardiac and Pulmonary Rehab Latest Ref Rng & Units 05/19/2009 08/24/2009 11/04/2009 02/08/2010 10/26/2015   Cholestrol 0 - 200 mg/dL 246(H) 230(H) - - 161   LDLCALC 0 - 99 mg/dL - - - - 91   LDLDIRECT mg/dL 176.9 151.3 - - -   HDL >40 mg/dL 63.30 59.60 - - 38(L)   Trlycerides <150 mg/dL 145.0 165.0(H) - - 162(H)   Hemoglobin A1c 4.6 - 6.5 % - 5.7 5.7(H) 5.3 -      Capillary Blood Glucose: Lab Results  Component Value Date   GLUCAP 71 11/08/2015   GLUCAP 85 10/27/2015   GLUCAP 110 (H) 10/27/2015   GLUCAP 86 10/26/2015   GLUCAP 83 09/22/2013     Exercise Target Goals: Date: 12/08/15  Exercise Program Goal: Individual exercise prescription set with THRR, safety & activity barriers. Participant demonstrates ability to understand and report RPE using BORG scale, to self-measure pulse accurately, and to acknowledge the importance of the exercise prescription.  Exercise Prescription Goal: Starting with aerobic activity 30 plus minutes a day, 3 days per week for initial exercise prescription. Provide home exercise prescription and guidelines that participant acknowledges  understanding prior to discharge.  Activity Barriers & Risk Stratification:     Activity Barriers & Cardiac Risk Stratification - 12/08/15 1502      Activity Barriers & Cardiac Risk Stratification   Cardiac Risk Stratification High      6 Minute Walk:     6 Minute Walk    Row Name 12/08/15 1142         6 Minute Walk   Phase Initial     Distance 1169 feet     Walk Time 6 minutes     MPH 2.21     METS 2.96     RPE 13     Perceived Dyspnea  0  VO2 Peak 10.34     Symptoms No     Resting HR 79 bpm     Resting BP 104/60     Max Ex. HR 98 bpm     Max Ex. BP 123/76     2 Minute Post BP 96/61        Initial Exercise Prescription:     Initial Exercise Prescription - 12/08/15 1100      Date of Initial Exercise RX and Referring Provider   Date 12/08/15   Referring Provider Dorris Carnes MD     Recumbant Bike   Level 1   Watts 22.7   Minutes 10   METs 2.9     NuStep   Level 1   Minutes 10   METs 1.7     Track   Laps 8   Minutes 10   METs 2.39     Prescription Details   Frequency (times per week) 3   Duration Progress to 45 minutes of aerobic exercise without signs/symptoms of physical distress     Intensity   THRR 40-80% of Max Heartrate 65-130   Ratings of Perceived Exertion 11-13   Perceived Dyspnea 0-4     Progression   Progression Continue to progress workloads to maintain intensity without signs/symptoms of physical distress.     Resistance Training   Training Prescription No      Perform Capillary Blood Glucose checks as needed.  Exercise Prescription Changes:   Exercise Comments:   Discharge Exercise Prescription (Final Exercise Prescription Changes):   Nutrition:  Target Goals: Understanding of nutrition guidelines, daily intake of sodium 1500mg , cholesterol 200mg , calories 30% from fat and 7% or less from saturated fats, daily to have 5 or more servings of fruits and vegetables.  Biometrics:     Pre Biometrics - 12/08/15  1209      Pre Biometrics   Waist Circumference 41.5 inches   Hip Circumference 42 inches   Waist to Hip Ratio 0.99 %   Triceps Skinfold 26 mm   % Body Fat 42.6 %   Grip Strength 21 kg   Flexibility 8.25 in   Single Leg Stand 1.3 seconds       Nutrition Therapy Plan and Nutrition Goals:   Nutrition Discharge: Nutrition Scores:   Nutrition Goals Re-Evaluation:   Psychosocial: Target Goals: Acknowledge presence or absence of depression, maximize coping skills, provide positive support system. Participant is able to verbalize types and ability to use techniques and skills needed for reducing stress and depression.  Initial Review & Psychosocial Screening:   Quality of Life Scores:   PHQ-9: Recent Review Flowsheet Data    Depression screen PHQ 2/9 12/07/2015   Decreased Interest 0   Down, Depressed, Hopeless 1   PHQ - 2 Score 1      Psychosocial Evaluation and Intervention:   Psychosocial Re-Evaluation:   Vocational Rehabilitation: Provide vocational rehab assistance to qualifying candidates.   Vocational Rehab Evaluation & Intervention:   Education: Education Goals: Education classes will be provided on a weekly basis, covering required topics. Participant will state understanding/return demonstration of topics presented.  Learning Barriers/Preferences:     Learning Barriers/Preferences - 12/08/15 0845      Learning Barriers/Preferences   Learning Barriers Sight;Hearing   Learning Preferences Individual Instruction      Education Topics: Count Your Pulse:  -Group instruction provided by verbal instruction, demonstration, patient participation and written materials to support subject.  Instructors address importance of being able to find your pulse  and how to count your pulse when at home without a heart monitor.  Patients get hands on experience counting their pulse with staff help and individually.   Heart Attack, Angina, and Risk Factor  Modification:  -Group instruction provided by verbal instruction, video, and written materials to support subject.  Instructors address signs and symptoms of angina and heart attacks.    Also discuss risk factors for heart disease and how to make changes to improve heart health risk factors.   Functional Fitness:  -Group instruction provided by verbal instruction, demonstration, patient participation, and written materials to support subject.  Instructors address safety measures for doing things around the house.  Discuss how to get up and down off the floor, how to pick things up properly, how to safely get out of a chair without assistance, and balance training.   Meditation and Mindfulness:  -Group instruction provided by verbal instruction, patient participation, and written materials to support subject.  Instructor addresses importance of mindfulness and meditation practice to help reduce stress and improve awareness.  Instructor also leads participants through a meditation exercise.    Stretching for Flexibility and Mobility:  -Group instruction provided by verbal instruction, patient participation, and written materials to support subject.  Instructors lead participants through series of stretches that are designed to increase flexibility thus improving mobility.  These stretches are additional exercise for major muscle groups that are typically performed during regular warm up and cool down.   Hands Only CPR Anytime:  -Group instruction provided by verbal instruction, video, patient participation and written materials to support subject.  Instructors co-teach with AHA video for hands only CPR.  Participants get hands on experience with mannequins.   Nutrition I class: Heart Healthy Eating:  -Group instruction provided by PowerPoint slides, verbal discussion, and written materials to support subject matter. The instructor gives an explanation and review of the Therapeutic Lifestyle  Changes diet recommendations, which includes a discussion on lipid goals, dietary fat, sodium, fiber, plant stanol/sterol esters, sugar, and the components of a well-balanced, healthy diet.   Nutrition II class: Lifestyle Skills:  -Group instruction provided by PowerPoint slides, verbal discussion, and written materials to support subject matter. The instructor gives an explanation and review of label reading, grocery shopping for heart health, heart healthy recipe modifications, and ways to make healthier choices when eating out.   Diabetes Question & Answer:  -Group instruction provided by PowerPoint slides, verbal discussion, and written materials to support subject matter. The instructor gives an explanation and review of diabetes co-morbidities, pre- and post-prandial blood glucose goals, pre-exercise blood glucose goals, signs, symptoms, and treatment of hypoglycemia and hyperglycemia, and foot care basics.   Diabetes Blitz:  -Group instruction provided by PowerPoint slides, verbal discussion, and written materials to support subject matter. The instructor gives an explanation and review of the physiology behind type 1 and type 2 diabetes, diabetes medications and rational behind using different medications, pre- and post-prandial blood glucose recommendations and Hemoglobin A1c goals, diabetes diet, and exercise including blood glucose guidelines for exercising safely.    Portion Distortion:  -Group instruction provided by PowerPoint slides, verbal discussion, written materials, and food models to support subject matter. The instructor gives an explanation of serving size versus portion size, changes in portions sizes over the last 20 years, and what consists of a serving from each food group.   Stress Management:  -Group instruction provided by verbal instruction, video, and written materials to support subject matter.  Instructors review role of  stress in heart disease and how to cope  with stress positively.     Exercising on Your Own:  -Group instruction provided by verbal instruction, power point, and written materials to support subject.  Instructors discuss benefits of exercise, components of exercise, frequency and intensity of exercise, and end points for exercise.  Also discuss use of nitroglycerin and activating EMS.  Review options of places to exercise outside of rehab.  Review guidelines for sex with heart disease.   Cardiac Drugs I:  -Group instruction provided by verbal instruction and written materials to support subject.  Instructor reviews cardiac drug classes: antiplatelets, anticoagulants, beta blockers, and statins.  Instructor discusses reasons, side effects, and lifestyle considerations for each drug class.   Cardiac Drugs II:  -Group instruction provided by verbal instruction and written materials to support subject.  Instructor reviews cardiac drug classes: angiotensin converting enzyme inhibitors (ACE-I), angiotensin II receptor blockers (ARBs), nitrates, and calcium channel blockers.  Instructor discusses reasons, side effects, and lifestyle considerations for each drug class.   Anatomy and Physiology of the Circulatory System:  -Group instruction provided by verbal instruction, video, and written materials to support subject.  Reviews functional anatomy of heart, how it relates to various diagnoses, and what role the heart plays in the overall system.   Knowledge Questionnaire Score:   Core Components/Risk Factors/Patient Goals at Admission:     Personal Goals and Risk Factors at Admission - 12/08/15 1536      Core Components/Risk Factors/Patient Goals on Admission   Intervention Provide an individualized exercise program and small group education classes.        Core Components/Risk Factors/Patient Goals Review:      Goals and Risk Factor Review    Row Name 12/08/15 1516             Core Components/Risk Factors/Patient Goals  Review   Personal Goals Review Increase Strength and Stamina;Other;Weight Management/Obesity       Review Pt wants to increase quality of breathing w/ singing and increase stamina       Expected Outcomes Improve overall functional capacity           Core Components/Risk Factors/Patient Goals at Discharge (Final Review):      Goals and Risk Factor Review - 12/08/15 1516      Core Components/Risk Factors/Patient Goals Review   Personal Goals Review Increase Strength and Stamina;Other;Weight Management/Obesity   Review Pt wants to increase quality of breathing w/ singing and increase stamina   Expected Outcomes Improve overall functional capacity       ITP Comments:     ITP Comments    Row Name 12/08/15 1501           ITP Comments Dr. Fransico Him, Medical Director          Comments: Patient attended orientation from 0800 to 1000 to review rules and guidelines for program. Completed 6 minute walk test, Intitial ITP, and exercise prescription.  VSS. Telemetry-Sinus rhythm. Received clearance from Dr Alen Blew [facility] oncologist to proceed with exercise at cardiac rehab..  Asymptomatic.Barnet Pall, RN,BSN 12/09/2015 5:19 PM

## 2015-12-09 NOTE — Patient Instructions (Addendum)
A few things to remember from today's visit:   Hypothyroidism, unspecified type  Essential hypertension  No changes today. In 3-4 months will check thyroid.  Please be sure medication list is accurate. If a new problem present, please set up appointment sooner than planned today.

## 2015-12-09 NOTE — Progress Notes (Signed)
HPI:   [name] is a 60 y.o. female, who is here today to establish care with me.  Former PCP: Dr Orland Mustard Last preventive routine visit: 2015. She follows with gynecologists regularly for preventive female care.  She also follows with oncologist, recently diagnosed with left breast cancer,invasive ductal carcinoma , grade 3 with lymphadenopathy 07/07/2015. She has history of omentum cancer (Peritoneal carcinomatosis with ascites, Bx showed adenocarcinoma  of gynecologic etiology), diagnosed about 3 years ago (08/2012) and has been on chemotherapy (q 3 weeks) since the diagnosis and according to pt, chemo she is receiving may also help controlled breast cancer.    Also Hx of HLD CAD, thrombocytopenia, RLS, bipolar disorder, and ADD. She follows with psychiatrist every 3 months.  CAD (10/2015) and CHF with EF 35-45% (heart cath), Echo LVEF 60-65% with grade I diastolic dysfunction.  Currently she is on Carvedilol 3.125 mg bid and Lisinopril 2.5 mg daily. Reporting no side effects from medication. She denies any frequent/severe headache, visual changes, chest pain, dyspnea, palpitation, orthopnea,PND,or edema.  DM is listed on her problem list, denies any Hx of diabetes but rather pre diabetes.  She is not exercising regularly and has not followed a healthful diet consistently, she just met with the pharmacist and planning on starting cardiac rehabilitation and will meet with nutritionist in the next few days for diet counseling.  She lives with husband.  History of hypothyroidism, currently she is on levothyroxine 100 g daily. TSH last checked a few weeks ago, slightly above normal range.  Lab Results  Component Value Date   TSH 5.305 (H) 10/26/2015   She has not noted changes in bowel habits, cold/heat intolerance, or tremor.  + Fatigue, stable and sine Dx with peritoneal carcinoma.  GERD: She is currently on Protonix, symptoms well controlled. + Nausea , mild,  usually with chemo treatment. 09/2007 EGD mild esophagitis with strictures and s/p dilation.  Denies dysphagia, abdominal pain, vomiting, changes in bowel habits, blood in stool or melena.   Review of Systems  Constitutional: Positive for fatigue. Negative for appetite change and fever.  HENT: Negative for mouth sores, nosebleeds and trouble swallowing.   Eyes: Negative for redness and visual disturbance.  Respiratory: Negative for cough, shortness of breath and wheezing.   Cardiovascular: Negative for chest pain, palpitations and leg swelling.  Gastrointestinal: Negative for abdominal pain, nausea and vomiting.       Negative for changes in bowel habits.  Endocrine: Negative for cold intolerance, heat intolerance, polydipsia, polyphagia and polyuria.  Genitourinary: Negative for decreased urine volume, difficulty urinating and hematuria.  Neurological: Negative for tremors, syncope, weakness and headaches.  Psychiatric/Behavioral: Negative for confusion. The patient is not nervous/anxious.       Current Outpatient Prescriptions on File Prior to Visit  Medication Sig Dispense Refill  . Acetylcarnitine HCl (ACETYL L-CARNITINE PO) Take 2,000 mg by mouth at bedtime.    . ALPRAZolam (XANAX) 0.5 MG tablet Take 0.5 mg by mouth 2 (two) times daily as needed for anxiety.    Marland Kitchen aspirin 81 MG chewable tablet Chew 1 tablet (81 mg total) by mouth daily. 30 tablet 0  . atorvastatin (LIPITOR) 40 MG tablet Take 1 tablet (40 mg total) by mouth daily at 6 PM. 30 tablet 0  . carvedilol (COREG) 3.125 MG tablet Take 0.5 tablets (1.56 mg total) by mouth 2 (two) times daily with a meal. 30 tablet 3  . clopidogrel (PLAVIX) 75 MG tablet Take 1 tablet (75  mg total) by mouth daily with breakfast. 30 tablet 0  . divalproex (DEPAKOTE) 250 MG DR tablet Take by mouth. Take '500mg'$ s in the morning and '750mg'$ s at night    . fluticasone (FLONASE) 50 MCG/ACT nasal spray INHALE 2 SPRAYS INTO EACH NOSTRIL EVERY NIGHT  11  .  gabapentin (NEURONTIN) 100 MG capsule Take 1 capsule (100 mg total) by mouth 3 (three) times daily. (Patient taking differently: Take 100 mg by mouth 2 (two) times daily. ) 90 capsule 1  . isosorbide mononitrate (IMDUR) 30 MG 24 hr tablet Take 1 tablet (30 mg total) by mouth daily. 90 tablet 3  . lamoTRIgine (LAMICTAL) 100 MG tablet Take 100 mg by mouth 2 (two) times daily.    Marland Kitchen levothyroxine (SYNTHROID, LEVOTHROID) 100 MCG tablet Take 100 mcg by mouth daily before breakfast.     . lidocaine-prilocaine (EMLA) cream Apply topically as needed. Apply to port with every chemotherapy. 30 g 0  . lisinopril (PRINIVIL,ZESTRIL) 2.5 MG tablet Take 1 tablet (2.5 mg total) by mouth daily. 30 tablet 0  . loperamide (IMODIUM A-D) 2 MG tablet Take 2 mg by mouth as needed for diarrhea or loose stools.    Marland Kitchen loratadine (CLARITIN) 10 MG tablet Take 10 mg by mouth daily as needed for allergies (with chemo).    . LORazepam (ATIVAN) 1 MG tablet Take 1 tablet (1 mg total) by mouth every 6 (six) hours as needed for anxiety. 30 tablet 0  . nitroGLYCERIN (NITROSTAT) 0.4 MG SL tablet Place 1 tablet (0.4 mg total) under the tongue every 5 (five) minutes as needed for chest pain. 25 tablet 3  . ondansetron (ZOFRAN) 8 MG tablet TAKE 1 TABLET BY MOUTH EVERY 8 HOURS AS NEEDED FOR NAUSEA AND VOMITING 20 tablet 1  . pantoprazole (PROTONIX) 40 MG tablet Take 1 tablet (40 mg total) by mouth daily. 30 tablet 11  . prochlorperazine (COMPAZINE) 10 MG tablet Take 1 tablet (10 mg total) by mouth every 6 (six) hours as needed for nausea or vomiting. 30 tablet 1  . rOPINIRole (REQUIP) 0.5 MG tablet Take 1.5 mg by mouth at bedtime.      Current Facility-Administered Medications on File Prior to Visit  Medication Dose Route Frequency Provider Last Rate Last Dose  . heparin lock flush 100 unit/mL  500 Units Intravenous Once Wyatt Portela, MD      . sodium chloride flush (NS) 0.9 % injection 10 mL  10 mL Intravenous PRN Wyatt Portela, MD           Past Medical History:  Diagnosis Date  . ADD (attention deficit disorder)   . Allergy    seasonal  . Anxiety   . Bipolar disorder ([place])   . Breast cancer ([place]) 1989   right breast  . Coronary artery disease   . Depression   . Diabetes mellitus    pt denies DM noe meds  . Hematochezia   . History of echocardiogram    a. Limited Echo 9/17: EF 60-65%, no RWMA, Gr 1 DD, trivial AI, trivial MR, GLS -12% (likely underestimated), no pericardial effusion  . Hyperlipidemia   . Hypothyroidism   . Maintenance chemotherapy    Pt has chemo every 3 weeks (on Friday)  . NSTEMI (non-ST elevated myocardial infarction) ([place]) 10/26/2015  . Osteopenia 03/2009   t score -2.1 FRAX 4.6/0.4  . Premature menopause   . Sleep apnea    mild   Allergies  Allergen Reactions  . Doxil [Doxorubicin  Hcl Liposomal] Anaphylaxis    1st Doxil.   . Pollen Extract Other (See Comments)    Pollen and grass causes a lot sneezing    Family History  Problem Relation Age of Onset  . Breast cancer Maternal Aunt 29  . Diabetes Maternal Grandmother   . Heart disease Maternal Grandmother   . Heart disease Maternal Grandfather   . Hypertension Paternal Grandfather   . Heart Problems Paternal Grandfather   . Breast cancer Maternal Aunt     dx. early 33s; had negative GT approx 10 years ago  . Breast cancer Maternal Aunt     dx. 64s with recurrence  . Allergies Father   . Heart disease Mother   . Other Mother     hx of hysterectomy   . Liver cancer Cousin 68    +EtOH  . Cancer Cousin     paternal 1st cousin, once-removed dx. NOS cancer (maybe ovarian) in late 30s-40s  . Cancer Cousin     female paternal 2nd cousin d. of NOS cancer in her 20s-early 21s  . Ovarian cancer Neg Hx   . Colon cancer Neg Hx     Social History   Social History  . Marital status: Married    Spouse name: [name]  . Number of children: 0  . Years of education: 14   Occupational History  . unemployed [employer],  [place]   Social History Main Topics  . Smoking status: Never Smoker  . Smokeless tobacco: Never Used  . Alcohol use 0.0 oz/week     Comment: very rarely - maybe 1 glass wine every 3 mos  . Drug use: No  . Sexual activity: No   Other Topics Concern  . None   Social History Narrative   Lives at home with husband.    Caffeine use:  Tea/soda occass    Vitals:   12/09/15 1353  BP: 126/78  Pulse: 91  Resp: 12    Body mass index is 30.8 kg/m.    Physical Exam  Nursing note and vitals reviewed. Constitutional: She is oriented to person, place, and time. She appears well-developed. No distress.  HENT:  Head: Atraumatic.  Mouth/Throat: Oropharynx is clear and moist and mucous membranes are normal.  Eyes: Conjunctivae and EOM are normal. Pupils are equal, round, and reactive to light.  Neck: No JVD present. No thyroid mass and no thyromegaly present.  Cardiovascular: Normal rate and regular rhythm.   Murmur: Soft SEM RUSB. Pulses:      Dorsalis pedis pulses are 2+ on the right side, and 2+ on the left side.  Respiratory: Effort normal and breath sounds normal. No respiratory distress.  GI: Soft. She exhibits no mass. There is no hepatomegaly. There is no tenderness.  Musculoskeletal: She exhibits no edema or tenderness.  Neurological: She is alert and oriented to person, place, and time. She has normal strength. Coordination and gait normal.  Skin: Skin is warm. No erythema.  Psychiatric: She has a normal mood and affect.  Well groomed, good eye contact.      ASSESSMENT AND PLAN:     Thomasina was seen today for establish care.  Diagnoses and all orders for this visit:   Gastroesophageal reflux disease, esophagitis presence not specified  Stable. No changes in current management. GERD precautions to continue.  Coronary artery disease involving native coronary artery of native heart without angina pectoris  Planning on starting cardiac rehab, no chest pain  reported. Continue Coreg,statin, Plavix,and Aspirin.  Hypothyroidism, unspecified type  Last TSH slightly above normal range, no changes for now, will re-check in 3-4 months.  Need for immunization against influenza -     Flu Vaccine QUAD 36+ mos IM             Tyvion Edmondson G. [name], MD  [facility] [place]. [place] office.

## 2015-12-09 NOTE — Progress Notes (Signed)
Pre visit review using our clinic review tool, if applicable. No additional management support is needed unless otherwise documented below in the visit note. 

## 2015-12-10 ENCOUNTER — Encounter: Payer: Self-pay | Admitting: Oncology

## 2015-12-12 ENCOUNTER — Ambulatory Visit: Payer: Self-pay

## 2015-12-12 ENCOUNTER — Ambulatory Visit (HOSPITAL_COMMUNITY): Payer: PPO

## 2015-12-12 ENCOUNTER — Telehealth: Payer: Self-pay | Admitting: Physician Assistant

## 2015-12-12 ENCOUNTER — Encounter (HOSPITAL_COMMUNITY)
Admission: RE | Admit: 2015-12-12 | Discharge: 2015-12-12 | Disposition: A | Discharge status: Home / Self Care | Payer: PPO | Source: Ambulatory Visit | Attending: Internal Medicine | Admitting: Internal Medicine

## 2015-12-12 ENCOUNTER — Telehealth: Payer: Self-pay | Admitting: Internal Medicine

## 2015-12-12 DIAGNOSIS — I214 Non-ST elevation (NSTEMI) myocardial infarction: Secondary | ICD-10-CM

## 2015-12-12 DIAGNOSIS — Z955 Presence of coronary angioplasty implant and graft: Secondary | ICD-10-CM

## 2015-12-12 NOTE — Telephone Encounter (Signed)
Will forward to Dr. Harrington Challenger for review and advisement.

## 2015-12-12 NOTE — Progress Notes (Signed)
Incomplete Session Note  Patient Details  Name: [name] MRN: [id number] Date of Birth: 1955-05-15 Referring Provider:   Flowsheet Row CARDIAC REHAB PHASE II ORIENTATION from 12/08/2015 in [place]  Referring Provider  Dorris Carnes MD      [name] did not complete her rehab session. Today was [name]'s first day of exercise at cardiac rehab. Entry blood pressure 103/72 heart rate 74. Oxygen saturation 100% on room air. [name] complained of feeling lightheaded after getting off the recumbent bike. Blood pressure 98/62. Exercise stopped. Patient given water. Recheck blood pressure 102/60 sitting. Standing blood pressure 94/60. Julie Leitz PA paged and notified. [name] instructed [name] to decrease her Imdur to 15 mg once a day over the phone. Patient states understanding. I reviewed [name]'s quality of life with her today. [name] is currently depressed and see's a therapist on a regular basis. [name] has an appointment with her therapist tomorrow. [name] gave me permission to forward her quality of life questionnaire to her therapist. Exit blood pressure 112/77. [name] plans to return to exercise on Wednesday.Barnet Pall, RN,BSN 12/12/2015 5:17 PM

## 2015-12-12 NOTE — Telephone Encounter (Signed)
Request for surgical clearance:  1. What type of surgery is being performed? Possible Root Canal   2. When is this surgery scheduled?Not scheduled  3. Are there any medications that need to be held prior to surgery and how long?Does she need to be pre-medicated and will it be alright to the Root Canal?   4. Name of physician performing surgery? Does not know which surgeon will do it at this time   5. What is your office phone and fax number? [phone] and fax is [phone]

## 2015-12-12 NOTE — Telephone Encounter (Signed)
    Patient is having dizziness at cardiac rehab and SBPs have been soft ~ 100. Has been having dizziness since imdur was increased from 15mg  --> 30mg  for arm pain. Has not had anymore arm pain .Will go back down to 15mg  daily.   Angelena Form PA-C  [facility]

## 2015-12-12 NOTE — Telephone Encounter (Signed)
Needs to stay on ASA and plavix, for at least 6 months, preferably 1 year before sopping

## 2015-12-14 ENCOUNTER — Encounter (HOSPITAL_COMMUNITY)
Admission: RE | Admit: 2015-12-14 | Discharge: 2015-12-14 | Disposition: A | Discharge status: Home / Self Care | Payer: PPO | Source: Ambulatory Visit | Attending: Internal Medicine | Admitting: Internal Medicine

## 2015-12-14 ENCOUNTER — Encounter: Payer: Self-pay | Admitting: *Deleted

## 2015-12-14 ENCOUNTER — Ambulatory Visit (HOSPITAL_COMMUNITY): Payer: PPO

## 2015-12-14 DIAGNOSIS — I214 Non-ST elevation (NSTEMI) myocardial infarction: Secondary | ICD-10-CM

## 2015-12-14 DIAGNOSIS — Z955 Presence of coronary angioplasty implant and graft: Secondary | ICD-10-CM

## 2015-12-14 NOTE — Telephone Encounter (Signed)
Follow up       1. What dental office are you calling from?  Dr Barbera Setters  2. What is your office phone and fax number?  Fax [phone]  3. What type of procedure is the patient having performed?  Routine cleaning, extractions, root canel, etc  4. What date is procedure scheduled? Not scheduled 5. What is your question (ex. Antibiotics prior to procedure, holding medication-we need to know how long dentist wants pt to hold med)? Need note stating pt does not need an antibiotic prior to dental treatment .  This must be on out letterhead.  It will be placed in the patient's file

## 2015-12-14 NOTE — Progress Notes (Signed)
Received call from [place] with Dr. Barbera Setters office (dentist) requesting if it's OK for patient to have dental work, cleaning and a root canal. Please fax a release form to [phone]. The office number is [phone]. Per Dr. Alen Blew, she is not to have any dental work done because she is actively on chemotherapy and has a low platelet count. I tried calling the office but it rang busy. Will continue to call.

## 2015-12-15 ENCOUNTER — Telehealth: Payer: Self-pay | Admitting: Internal Medicine

## 2015-12-15 ENCOUNTER — Encounter: Payer: Self-pay | Admitting: Oncology

## 2015-12-15 NOTE — Telephone Encounter (Signed)
Renee called from [facility] and is needing a clearance for this pt to have a root cannel --please give her a call @ [phone] --can fax clearance to [phone] or email [email or web address]

## 2015-12-15 NOTE — Telephone Encounter (Signed)
Spoke with Renea at [facility].  They do not have a clearance for to sent.   They are working this patient up for a root canal.  She does not have to stop asa and plavix for this procedure.    Renea would like 1.) would she need premedicated with antibiotic and                               2.) can she be cleared from a cardiac standpoint to get                                    the root canal   She is aware I am forwarding to Dr. Harrington Challenger to document recommendations.  Reviewed recent labs and also suggested clearance from oncology for this patient.

## 2015-12-15 NOTE — Telephone Encounter (Signed)
Called [facility]  They do not have patient scheduled to be seen at this time.  She has never been seen there but they do have a chart for her.  I advised that she had a cardiac stent placed last month (sept, 2017) and per Dr. Harrington Challenger she needs to stay on her asa and plavix for at least 6 months, preferably 1 year before interrupting it.    Left message on voicemail at Dr. Barbera Setters office to call back in regard to patient needed antibiotic treatment prior to any procedures.

## 2015-12-16 ENCOUNTER — Encounter (HOSPITAL_COMMUNITY)
Admission: RE | Admit: 2015-12-16 | Discharge: 2015-12-16 | Disposition: A | Discharge status: Home / Self Care | Payer: PPO | Source: Ambulatory Visit | Attending: Internal Medicine | Admitting: Internal Medicine

## 2015-12-16 ENCOUNTER — Ambulatory Visit (HOSPITAL_COMMUNITY): Payer: PPO

## 2015-12-16 DIAGNOSIS — Z955 Presence of coronary angioplasty implant and graft: Secondary | ICD-10-CM

## 2015-12-16 DIAGNOSIS — I214 Non-ST elevation (NSTEMI) myocardial infarction: Secondary | ICD-10-CM

## 2015-12-16 NOTE — Telephone Encounter (Signed)
Faxed Dr. Alan Ripper recommendations through [facility] to [facility] [phone]

## 2015-12-16 NOTE — Telephone Encounter (Signed)
SHe is OK to proceed with root canal as long as she can stay on ASA and Plavix She does not need antibiotic prophylaxis from cardiac standpoint

## 2015-12-19 ENCOUNTER — Encounter: Payer: Self-pay | Admitting: Gynecology

## 2015-12-19 ENCOUNTER — Ambulatory Visit (HOSPITAL_COMMUNITY): Payer: PPO

## 2015-12-19 ENCOUNTER — Ambulatory Visit (INDEPENDENT_AMBULATORY_CARE_PROVIDER_SITE_OTHER): Payer: PPO | Admitting: Gynecology

## 2015-12-19 ENCOUNTER — Encounter (HOSPITAL_COMMUNITY): Payer: PPO

## 2015-12-19 VITALS — BP 120/76 | Ht 61.0 in | Wt 163.0 lb

## 2015-12-19 DIAGNOSIS — C50912 Malignant neoplasm of unspecified site of left female breast: Secondary | ICD-10-CM | POA: Diagnosis not present

## 2015-12-19 DIAGNOSIS — N952 Postmenopausal atrophic vaginitis: Secondary | ICD-10-CM

## 2015-12-19 DIAGNOSIS — Z124 Encounter for screening for malignant neoplasm of cervix: Secondary | ICD-10-CM | POA: Diagnosis not present

## 2015-12-19 DIAGNOSIS — M8580 Other specified disorders of bone density and structure, unspecified site: Secondary | ICD-10-CM

## 2015-12-19 DIAGNOSIS — Z01419 Encounter for gynecological examination (general) (routine) without abnormal findings: Secondary | ICD-10-CM | POA: Diagnosis not present

## 2015-12-19 DIAGNOSIS — C50911 Malignant neoplasm of unspecified site of right female breast: Secondary | ICD-10-CM

## 2015-12-19 NOTE — Addendum Note (Signed)
Addended by: Nelva Nay on: 12/19/2015 04:51 PM   Modules accepted: Orders

## 2015-12-19 NOTE — Patient Instructions (Signed)
Ask your oncologist about whether they feel you should pursue Bone density studies.

## 2015-12-19 NOTE — Progress Notes (Signed)
    [name] [facility] 1955/04/13 [id number]        60 y.o.  G0P0  for annual exam.  History of right breast cancer diagnosed 1989.  Primary peritoneal carcinoma diagnosed 2014 currently undergoing salvage chemotherapy. Most recently diagnosed with left breast cancer. Reports undergoing genetic testing which was negative. Without GYN complaints.  Past medical history,surgical history, problem list, medications, allergies, family history and social history were all reviewed and documented as reviewed in the EPIC chart.  ROS:  Performed with pertinent positives and negatives included in the history, assessment and plan.   Additional significant findings :  None   Exam: Caryn Bee assistant Vitals:   12/19/15 1602  BP: 120/76  Weight: 163 lb (73.9 kg)  Height: 5\' 1"  (1.549 m)   Body mass index is 30.8 kg/m.  General appearance:  Normal affect, orientation and appearance. Skin: Grossly normal HEENT: Without gross lesions.  No cervical or supraclavicular adenopathy. Thyroid normal.  Lungs:  Clear without wheezing, rales or rhonchi Cardiac: RR, without RMG Abdominal:  Soft, nontender, without masses, guarding, rebound, organomegaly or hernia Breasts:  Examined lying and sitting. Left without masses, retractions, discharge or axillary adenopathy.  Right status post lumpectomy with radiation changes. No masses or axillary adenopathy Pelvic:   Ext, BUS, Vagina with atrophic changes  Cervix with atrophic changes. Pap smear done  Uterus axial to anteverted, normal size, shape and contour, midline and mobile nontender   Adnexa without masses or tenderness    Anus and perineum normal   Rectovaginal normal sphincter tone without palpated masses or tenderness.    Assessment/Plan:  60 y.o. G0P0 female for annual exam.  1. Postmenopausal. Without significant hot flushes, night sweats, vaginal dryness or any vaginal bleeding. 2. Pap smear 2012. Pap smear done today. No history of abnormal Pap  smears previously. 3. Breast cancer right diagnosed 1989, left diagnosed 06/2015. Actively being taken care of by oncology. 4. Primary peritoneal carcinoma 2014. Currently on salvage chemotherapy actively being taken care of by oncology. 5. Colonoscopy 2016. Repeat at their recommended interval. 6. Osteopenia. DEXA 2011 T score -2.1 FRAX 4%/0.4%. Discussed pros and cons of repeating now with possible therapy if worsening/osteoporosis. Given everything else is going on with her at this point recommended she follow up with her oncologist and ask their opinion as far as further DEXA studies and she agrees to do so. 7. Health maintenance. No routine lab work done as this is all done elsewhere. Follow up with me as needed for gynecologic issues.   Anastasio Auerbach MD, 4:39 PM 12/19/2015

## 2015-12-21 ENCOUNTER — Encounter (HOSPITAL_COMMUNITY)
Admission: RE | Admit: 2015-12-21 | Discharge: 2015-12-21 | Disposition: A | Discharge status: Home / Self Care | Payer: PPO | Source: Ambulatory Visit | Attending: Internal Medicine | Admitting: Internal Medicine

## 2015-12-21 ENCOUNTER — Telehealth: Payer: Self-pay

## 2015-12-21 ENCOUNTER — Ambulatory Visit (HOSPITAL_COMMUNITY): Payer: PPO

## 2015-12-21 VITALS — BP 118/80 | HR 85 | Ht 61.0 in | Wt 166.7 lb

## 2015-12-21 DIAGNOSIS — Z79899 Other long term (current) drug therapy: Secondary | ICD-10-CM | POA: Insufficient documentation

## 2015-12-21 DIAGNOSIS — Z7982 Long term (current) use of aspirin: Secondary | ICD-10-CM | POA: Diagnosis not present

## 2015-12-21 DIAGNOSIS — Z955 Presence of coronary angioplasty implant and graft: Secondary | ICD-10-CM | POA: Diagnosis not present

## 2015-12-21 DIAGNOSIS — I214 Non-ST elevation (NSTEMI) myocardial infarction: Secondary | ICD-10-CM | POA: Diagnosis not present

## 2015-12-21 NOTE — Telephone Encounter (Signed)
Received refill requests for: Ondansetron 8mg  Nitroglycerin 0.4mg  Lamotrigine 100mg   Okay to refill?

## 2015-12-22 ENCOUNTER — Telehealth: Payer: Self-pay | Admitting: Oncology

## 2015-12-22 ENCOUNTER — Telehealth: Payer: Self-pay | Admitting: *Deleted

## 2015-12-22 ENCOUNTER — Telehealth: Payer: Self-pay | Admitting: Internal Medicine

## 2015-12-22 ENCOUNTER — Ambulatory Visit (HOSPITAL_BASED_OUTPATIENT_CLINIC_OR_DEPARTMENT_OTHER): Payer: PPO | Admitting: Oncology

## 2015-12-22 ENCOUNTER — Ambulatory Visit: Payer: PPO

## 2015-12-22 ENCOUNTER — Other Ambulatory Visit (HOSPITAL_BASED_OUTPATIENT_CLINIC_OR_DEPARTMENT_OTHER): Payer: PPO

## 2015-12-22 ENCOUNTER — Ambulatory Visit (HOSPITAL_BASED_OUTPATIENT_CLINIC_OR_DEPARTMENT_OTHER): Payer: PPO

## 2015-12-22 VITALS — BP 110/43 | HR 62 | Temp 98.0°F | Resp 18 | Ht 61.0 in | Wt 164.9 lb

## 2015-12-22 DIAGNOSIS — C50919 Malignant neoplasm of unspecified site of unspecified female breast: Secondary | ICD-10-CM

## 2015-12-22 DIAGNOSIS — D696 Thrombocytopenia, unspecified: Secondary | ICD-10-CM | POA: Diagnosis not present

## 2015-12-22 DIAGNOSIS — C482 Malignant neoplasm of peritoneum, unspecified: Secondary | ICD-10-CM

## 2015-12-22 DIAGNOSIS — C50912 Malignant neoplasm of unspecified site of left female breast: Secondary | ICD-10-CM | POA: Diagnosis not present

## 2015-12-22 DIAGNOSIS — Z5111 Encounter for antineoplastic chemotherapy: Secondary | ICD-10-CM

## 2015-12-22 DIAGNOSIS — Z95828 Presence of other vascular implants and grafts: Secondary | ICD-10-CM

## 2015-12-22 LAB — COMPREHENSIVE METABOLIC PANEL
ALBUMIN: 3.3 g/dL — AB (ref 3.5–5.0)
ALK PHOS: 89 U/L (ref 40–150)
ALT: 9 U/L (ref 0–55)
AST: 16 U/L (ref 5–34)
Anion Gap: 6 mEq/L (ref 3–11)
BUN: 15.2 mg/dL (ref 7.0–26.0)
CALCIUM: 9.1 mg/dL (ref 8.4–10.4)
CO2: 26 mEq/L (ref 22–29)
CREATININE: 0.7 mg/dL (ref 0.6–1.1)
Chloride: 107 mEq/L (ref 98–109)
EGFR: >90 mL/min/{1.73_m2} (ref >90)
Glucose: 94 mg/dl (ref 70–140)
Potassium: 4.5 mEq/L (ref 3.5–5.1)
Sodium: 140 mEq/L (ref 136–145)
TOTAL PROTEIN: 7.4 g/dL (ref 6.4–8.3)
Total Bilirubin: 0.26 mg/dL (ref 0.20–1.20)

## 2015-12-22 LAB — PAP IG W/ RFLX HPV ASCU

## 2015-12-22 LAB — CBC WITH DIFFERENTIAL/PLATELET
BASO%: 0.3 % (ref 0.0–2.0)
Basophils Absolute: 0 10*3/uL (ref 0.0–0.1)
EOS%: 1.4 % (ref 0.0–7.0)
Eosinophils Absolute: 0 10*3/uL (ref 0.0–0.5)
HEMATOCRIT: 28.7 % — AB (ref 34.8–46.6)
HEMOGLOBIN: 9.1 g/dL — AB (ref 11.6–15.9)
LYMPH#: 1.2 10*3/uL (ref 0.9–3.3)
LYMPH%: 43.3 % (ref 14.0–49.7)
MCH: 32.2 pg (ref 25.1–34.0)
MCHC: 31.8 g/dL (ref 31.5–36.0)
MCV: 101.2 fL — ABNORMAL HIGH (ref 79.5–101.0)
MONO#: 0.3 10*3/uL (ref 0.1–0.9)
MONO%: 12.6 % (ref 0.0–14.0)
NEUT#: 1.2 10*3/uL — ABNORMAL LOW (ref 1.5–6.5)
NEUT%: 42.4 % (ref 38.4–76.8)
Platelets: 59 10*3/uL — ABNORMAL LOW (ref 145–400)
RBC: 2.83 10*6/uL — ABNORMAL LOW (ref 3.70–5.45)
RDW: 17.2 % — AB (ref 11.2–14.5)
WBC: 2.8 10*3/uL — ABNORMAL LOW (ref 3.9–10.3)

## 2015-12-22 MED ORDER — SODIUM CHLORIDE 0.9 % IJ SOLN
10.0000 mL | INTRAMUSCULAR | Status: DC | PRN
  Administered 2015-12-22: 10 mL via INTRAVENOUS
  Filled 2015-12-22: qty 10

## 2015-12-22 MED ORDER — HEPARIN SOD (PORK) LOCK FLUSH 100 UNIT/ML IV SOLN
500.0000 [IU] | Freq: Once | INTRAVENOUS | Status: AC | PRN
Start: 2015-12-22 — End: 2015-12-22
  Administered 2015-12-22: 500 [IU]
  Filled 2015-12-22: qty 5

## 2015-12-22 MED ORDER — PALONOSETRON HCL INJECTION 0.25 MG/5ML
INTRAVENOUS | Status: AC
  Filled 2015-12-22: qty 5

## 2015-12-22 MED ORDER — CARBOPLATIN CHEMO INTRADERMAL TEST DOSE 100MCG/0.02ML
100.0000 ug | Freq: Once | INTRADERMAL | Status: AC
  Administered 2015-12-22: 100 ug via INTRADERMAL
  Filled 2015-12-22: qty 0.02

## 2015-12-22 MED ORDER — PALONOSETRON HCL INJECTION 0.25 MG/5ML
0.2500 mg | Freq: Once | INTRAVENOUS | Status: AC
  Administered 2015-12-22: 0.25 mg via INTRAVENOUS

## 2015-12-22 MED ORDER — SODIUM CHLORIDE 0.9 % IV SOLN
Freq: Once | INTRAVENOUS | Status: AC
  Administered 2015-12-22: 10:00:00 via INTRAVENOUS

## 2015-12-22 MED ORDER — DEXAMETHASONE 4 MG PO TABS
ORAL_TABLET | ORAL | Status: AC
  Filled 2015-12-22: qty 1

## 2015-12-22 MED ORDER — SODIUM CHLORIDE 0.9% FLUSH
10.0000 mL | INTRAVENOUS | Status: DC | PRN
  Administered 2015-12-22: 10 mL
  Filled 2015-12-22: qty 10

## 2015-12-22 MED ORDER — SODIUM CHLORIDE 0.9 % IV SOLN
570.0000 mg | Freq: Once | INTRAVENOUS | Status: AC
  Administered 2015-12-22: 570 mg via INTRAVENOUS
  Filled 2015-12-22: qty 57

## 2015-12-22 MED ORDER — DEXAMETHASONE SODIUM PHOSPHATE 10 MG/ML IJ SOLN
4.0000 mg | Freq: Once | INTRAMUSCULAR | Status: AC
  Administered 2015-12-22: 4 mg via INTRAVENOUS

## 2015-12-22 NOTE — Progress Notes (Signed)
Hematology and Oncology Follow Up Visit  [name] [id number] 02-24-1955 60 y.o. 12/22/2015 9:10 AM    Principle Diagnosis: 60 year old woman diagnosed with:  1. 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 This was diagnosed in July of 2014. 2. 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 The tumor is ER positive PR negative, HER-2 negative.   Prior Therapy:  She is status post paracentesis performed on 09/17/2012 with the cytology confirmed the presence of adenocarcinoma and immunohistochemical stains suggest GYN etiology. She is also status post lumpectomy for breast cancer diagnosed 25 years ago followed by radiation and chemotherapy under the care of Dr. Sonny Dandy. She did not receive any hormonal therapy. Chemotherapy utilizing carboplatin and Taxotere started on 10/01/2012. Avastin was added with cycle 4. This was discontinued in June of 2015. She is S/P Avastin maintenance only between June 2015 till March 2017. Therapy discontinued due to progression of disease. She is status post Doxil salvage chemotherapy therapy discontinued because of hypersensitivity reaction in March 2017.  Current therapy:  Carboplatin and AUC of 5 every 3 weeks cycle 1 started on 07/08/2015. She is here for cycle 9 of therapy.  Interim History: [name] presents today for a followup visit with her husband. Since her last visit, she continues to do reasonably well without any decline in her health. She continues to use a reasonable quality of life which has not declined. He is able to attend activities of daily living.   She tolerated the last cycle of chemotherapy without major issues. She does report mild nausea that is manageable. No vomiting or diarrhea. She did report some constipation attributed to her cardiac medication. She continues to participate in cardiac  rehabilitation without any decline.  She denied any bleeding complications including epistaxis, hematochezia or melena. She denied any oral bleeding or dental irritation. She did have a tooth pain and recommended to have a root canal which is currently on hold because of chemotherapy.  She does not report any headaches, blurry vision, syncope or seizures. She denied any fever chills. She does report some chest discomfort but no dyspnea on exertion. Has not reported any shortness of breath or cough. She denied any pain with swallowing. She does not report any vomiting, constipation does report some occasional diarrhea. She denied any GYN bleeding. She reports no anxiety or depression. Remainder of the review of systems is unremarkable.  Medications: I have reviewed the patient's current medications.  Current Outpatient Prescriptions  Medication Sig Dispense Refill  . Acetylcarnitine HCl (ACETYL L-CARNITINE PO) Take 2,000 mg by mouth at bedtime.    . ALPRAZolam (XANAX) 0.5 MG tablet Take 0.5 mg by mouth 2 (two) times daily as needed for anxiety.    Marland Kitchen aspirin 81 MG chewable tablet Chew 1 tablet (81 mg total) by mouth daily. 30 tablet 0  . atorvastatin (LIPITOR) 40 MG tablet Take 1 tablet (40 mg total) by mouth daily at 6 PM. 30 tablet 0  . carvedilol (COREG) 3.125 MG tablet Take 0.5 tablets (1.56 mg total) by mouth 2 (two) times daily with a meal. 30 tablet 3  . clopidogrel (PLAVIX) 75 MG tablet Take 1 tablet (75 mg total) by mouth daily with breakfast. 30 tablet 0  . divalproex (DEPAKOTE) 250 MG DR tablet Take by mouth. Take 56ms in the morning and 7533m at night    . fluticasone (FLONASE) 50  MCG/ACT nasal spray INHALE 2 SPRAYS INTO EACH NOSTRIL EVERY NIGHT  11  . gabapentin (NEURONTIN) 100 MG capsule Take 1 capsule (100 mg total) by mouth 3 (three) times daily. (Patient taking differently: Take 100 mg by mouth 2 (two) times daily. ) 90 capsule 1  . isosorbide mononitrate (IMDUR) 30 MG 24 hr  tablet Take 1 tablet (30 mg total) by mouth daily. 90 tablet 3  . lamoTRIgine (LAMICTAL) 100 MG tablet Take 100 mg by mouth 2 (two) times daily.    Marland Kitchen levothyroxine (SYNTHROID, LEVOTHROID) 100 MCG tablet Take 100 mcg by mouth daily before breakfast.     . lidocaine-prilocaine (EMLA) cream Apply topically as needed. Apply to port with every chemotherapy. 30 g 0  . lisinopril (PRINIVIL,ZESTRIL) 2.5 MG tablet Take 1 tablet (2.5 mg total) by mouth daily. 30 tablet 0  . loperamide (IMODIUM A-D) 2 MG tablet Take 2 mg by mouth as needed for diarrhea or loose stools.    Marland Kitchen loratadine (CLARITIN) 10 MG tablet Take 10 mg by mouth daily as needed for allergies (with chemo).    . nitroGLYCERIN (NITROSTAT) 0.4 MG SL tablet Place 1 tablet (0.4 mg total) under the tongue every 5 (five) minutes as needed for chest pain. 25 tablet 3  . ondansetron (ZOFRAN) 8 MG tablet TAKE 1 TABLET BY MOUTH EVERY 8 HOURS AS NEEDED FOR NAUSEA AND VOMITING 20 tablet 1  . pantoprazole (PROTONIX) 40 MG tablet Take 1 tablet (40 mg total) by mouth daily. 30 tablet 11  . rOPINIRole (REQUIP) 0.5 MG tablet Take 1.5 mg by mouth at bedtime.      No current facility-administered medications for this visit.    Facility-Administered Medications Ordered in Other Visits  Medication Dose Route Frequency Provider Last Rate Last Dose  . heparin lock flush 100 unit/mL  500 Units Intravenous Once Wyatt Portela, MD      . sodium chloride flush (NS) 0.9 % injection 10 mL  10 mL Intravenous PRN Wyatt Portela, MD         Allergies:  Allergies  Allergen Reactions  . Doxil [Doxorubicin Hcl Liposomal] Anaphylaxis    1st Doxil.   . Pollen Extract Other (See Comments)    Pollen and grass causes a lot sneezing    Past Medical History, Surgical history, Social history, and Family History were reviewed and updated.   Physical Exam: Blood pressure (!) 110/43, pulse 62, temperature 98 F (36.7 C), temperature source Oral, resp. rate 18, height _0   (1.549 m), weight 164 lb 14.4 oz (74.8 kg), SpO2 100 %.  ECOG: 1 General appearance: Alert, awake woman without distress. Head: Normocephalic, without obvious abnormality. No oral ulcers or lesions. No bleeding noted. Neck: no adenopathy Lymph nodes: Cervical, supraclavicular, and axillary nodes normal. Heart:regular rate and rhythm, S1, S2 normal, no murmur, click, rub or gallop Lung:chest clear, no wheezing, rales, normal symmetric air entry Abdomin: Soft, nontender with good bowel sounds. No shifting dullness or ascites. EXT:no erythema, induration, or nodules. No edema. Skin: No rashes or lesions. No petechiae or ecchymosis.  Lab Results: Lab Results  Component Value Date   WBC 2.8 (L) 12/22/2015   HGB 9.1 (L) 12/22/2015   HCT 28.7 (L) 12/22/2015   MCV 101.2 (H) 12/22/2015   PLT 59 (L) 12/22/2015     Chemistry      Component Value Date/Time   NA 139 12/01/2015 1152   K 4.6 12/01/2015 1152   CL 104 10/27/2015 0430   CO2  26 12/01/2015 1152   BUN 13.4 12/01/2015 1152   CREATININE 0.7 12/01/2015 1152      Component Value Date/Time   CALCIUM 9.2 12/01/2015 1152   ALKPHOS 86 12/01/2015 1152   AST 13 12/01/2015 1152   ALT 8 12/01/2015 1152   BILITOT 0.28 12/01/2015 1152         Results for LIZET, KELSO (MRN [id number]) as of 12/22/2015 08:38  Ref. Range 10/21/2015 10:47 11/10/2015 12:45 12/01/2015 11:52  Cancer Antigen (CA) 125 Latest Ref Range: 0.0 - 38.1 U/mL 60.5 (H) 44.6 (H) 41.4 (H)       Impression and Plan:  60 year old woman with the following issues:  1. Peritoneal carcinomatosis with omental involvement. The pathology confirmed the presence of adenocarcinoma of likely GYN etiology. Her CA 125 was elevated at 333.5 on 09/19/2012. She is status post systemic chemotherapy with an excellent response utilizing carboplatin and Taxotere and a Avastin . This therapy was held in June of 2015 and currently on Avastin maintenance.   CT scan obtained on  05/05/2015 as well as her tumor markers showed clear progression. Her CA-125 is up to 189 and her CT scan showed increase in her peritoneal disease.   She is currently receiving salvage therapy with carboplatin single agent at AUC of 5 with her tumor marker continue to respond at this time. Her CA-125 is down to 41.4 after it was a as high as 189 prior to therapy. Her last CT scan in August 2017 showed stable disease.  The plan is to proceed with this cycle of chemotherapy without any dose reduction or delay. I anticipate repeating CT scan after her next cycle of therapy and anticipate a treatment break after December 14 depending on the results of the scan. She is agreeable to proceed with this current plan.    2. IV access: Port-A-Cath is utilized for chemotherapy without recent issues or complaints.  3. Thrombocytopenia: Her platelets  remains relatively stable. Her thrombocytopenia is related to liver disease and in part because of chemotherapy. Long as her platelets above 50,000, we'll proceed with chemotherapy.  4. Abdominal distention: No ascites noted on ultrasound unlikely related to peritoneal carcinomatosis. Clinically improving at this time.  5. Left breast mass: She status post biopsy with the pathology indicating T2 N1 disease with the tumor have ER positive, PR negative HER-2 negative. Her Ki-67 is 50%.  The plan is to treat with systemic chemotherapy and consider salvage mastectomy if she has a good response to her ovarian cancer. Repeat mammography will be done in January 2018.  8. Neutropenia: Neulasta will be given after a cycles of therapy.  9. Genetic counseling: She has been evaluated for possible genetic disorder but none has been identified.  10. Myocardial infarction: She is receiving medical therapy and appears to be stable. She will receiving cardiac rehabilitation and continues to participate.  11. The followup: She will in 3 weeks for the next cycle of  therapy.    [id number],BJYNW, MD 11/2/20179:10 AM

## 2015-12-22 NOTE — Patient Instructions (Signed)

## 2015-12-22 NOTE — Telephone Encounter (Signed)
Sent denials back to pharmacy and told them which MD's to send Rx requests too.

## 2015-12-22 NOTE — Telephone Encounter (Signed)
She follows with psychiatrists and oncologists, I think they are prescribing Lamotrigine and Ondansetron respectively. Nitroglycerin, is not she following with cardiologists? If she is not Nitro SL 0.4 mg to use as needed, max 3 doses in 15 min # 30/0 can be sent.  Thanks, BJ

## 2015-12-22 NOTE — Patient Instructions (Signed)
[facility] Discharge Instructions for Patients Receiving Chemotherapy  Today you received the following chemotherapy agents: Carboplatin   To help prevent nausea and vomiting after your treatment, we encourage you to take your nausea medication as directed.    If you develop nausea and vomiting that is not controlled by your nausea medication, call the clinic.   BELOW ARE SYMPTOMS THAT SHOULD BE REPORTED IMMEDIATELY:  *FEVER GREATER THAN 100.5 F  *CHILLS WITH OR WITHOUT FEVER  NAUSEA AND VOMITING THAT IS NOT CONTROLLED WITH YOUR NAUSEA MEDICATION  *UNUSUAL SHORTNESS OF BREATH  *UNUSUAL BRUISING OR BLEEDING  TENDERNESS IN MOUTH AND THROAT WITH OR WITHOUT PRESENCE OF ULCERS  *URINARY PROBLEMS  *BOWEL PROBLEMS  UNUSUAL RASH Items with * indicate a potential emergency and should be followed up as soon as possible.  Feel free to call the clinic you have any questions or concerns. The clinic phone number is [phone].  Please show the CHEMO ALERT CARD at check-in to the Emergency Department and triage nurse.   

## 2015-12-22 NOTE — Telephone Encounter (Signed)
New message   Please fax dental form over to [phone]  Attention Kim.  -Dr. Evelene Croon.

## 2015-12-22 NOTE — Telephone Encounter (Signed)
Appointments scheduled per 11/2 LOS. Patient given AVS report and calendars with future scheduled appointments. Patient given two bottles of contrast and instructions for scan.

## 2015-12-22 NOTE — Telephone Encounter (Signed)
Per LOS I have scheduled appts and notified the scheduler 

## 2015-12-22 NOTE — Telephone Encounter (Signed)
Faxed telephone encounter note from 12/15/15 to Dr. Evelene Croon office, attention Maudie Mercury (via [facility] fax) [phone].

## 2015-12-22 NOTE — Progress Notes (Signed)
Pt is okay to treat today with neut 1.2  per [facility], per Dr. Alen Blew.

## 2015-12-23 ENCOUNTER — Ambulatory Visit (HOSPITAL_BASED_OUTPATIENT_CLINIC_OR_DEPARTMENT_OTHER): Payer: PPO

## 2015-12-23 ENCOUNTER — Encounter (HOSPITAL_COMMUNITY): Payer: PPO

## 2015-12-23 ENCOUNTER — Telehealth (HOSPITAL_COMMUNITY): Payer: Self-pay | Admitting: Family Medicine

## 2015-12-23 ENCOUNTER — Ambulatory Visit (HOSPITAL_COMMUNITY): Payer: PPO

## 2015-12-23 VITALS — BP 111/67 | HR 67 | Temp 98.6°F | Resp 20

## 2015-12-23 DIAGNOSIS — Z5189 Encounter for other specified aftercare: Secondary | ICD-10-CM | POA: Diagnosis not present

## 2015-12-23 DIAGNOSIS — C482 Malignant neoplasm of peritoneum, unspecified: Secondary | ICD-10-CM | POA: Diagnosis not present

## 2015-12-23 LAB — CA 125: Cancer Antigen (CA) 125: 39.1 U/mL — ABNORMAL HIGH (ref 0.0–38.1)

## 2015-12-23 MED ORDER — PEGFILGRASTIM INJECTION 6 MG/0.6ML ~~LOC~~
6.0000 mg | PREFILLED_SYRINGE | Freq: Once | SUBCUTANEOUS | Status: AC
  Administered 2015-12-23: 6 mg via SUBCUTANEOUS
  Filled 2015-12-23: qty 0.6

## 2015-12-23 NOTE — Patient Instructions (Signed)
Pegfilgrastim injection What is this medicine? PEGFILGRASTIM (PEG fil gra stim) is a long-acting granulocyte colony-stimulating factor that stimulates the growth of neutrophils, a type of white blood cell important in the body's fight against infection. It is used to reduce the incidence of fever and infection in patients with certain types of cancer who are receiving chemotherapy that affects the bone marrow, and to increase survival after being exposed to high doses of radiation. This medicine may be used for other purposes; ask your health care provider or pharmacist if you have questions. What should I tell my health care provider before I take this medicine? They need to know if you have any of these conditions: -kidney disease -latex allergy -ongoing radiation therapy -sickle cell disease -skin reactions to acrylic adhesives (On-Body Injector only) -an unusual or allergic reaction to pegfilgrastim, filgrastim, other medicines, foods, dyes, or preservatives -pregnant or trying to get pregnant -breast-feeding How should I use this medicine? This medicine is for injection under the skin. If you get this medicine at home, you will be taught how to prepare and give the pre-filled syringe or how to use the On-body Injector. Refer to the patient Instructions for Use for detailed instructions. Use exactly as directed. Take your medicine at regular intervals. Do not take your medicine more often than directed. It is important that you put your used needles and syringes in a special sharps container. Do not put them in a trash can. If you do not have a sharps container, call your pharmacist or healthcare provider to get one. Talk to your pediatrician regarding the use of this medicine in children. While this drug may be prescribed for selected conditions, precautions do apply. Overdosage: If you think you have taken too much of this medicine contact a poison control center or emergency room at  once. NOTE: This medicine is only for you. Do not share this medicine with others. What if I miss a dose? It is important not to miss your dose. Call your doctor or health care professional if you miss your dose. If you miss a dose due to an On-body Injector failure or leakage, a new dose should be administered as soon as possible using a single prefilled syringe for manual use. What may interact with this medicine? Interactions have not been studied. Give your health care provider a list of all the medicines, herbs, non-prescription drugs, or dietary supplements you use. Also tell them if you smoke, drink alcohol, or use illegal drugs. Some items may interact with your medicine. This list may not describe all possible interactions. Give your health care provider a list of all the medicines, herbs, non-prescription drugs, or dietary supplements you use. Also tell them if you smoke, drink alcohol, or use illegal drugs. Some items may interact with your medicine. What should I watch for while using this medicine? You may need blood work done while you are taking this medicine. If you are going to need a MRI, CT scan, or other procedure, tell your doctor that you are using this medicine (On-Body Injector only). What side effects may I notice from receiving this medicine? Side effects that you should report to your doctor or health care professional as soon as possible: -allergic reactions like skin rash, itching or hives, swelling of the face, lips, or tongue -dizziness -fever -pain, redness, or irritation at site where injected -pinpoint red spots on the skin -red or dark-brown urine -shortness of breath or breathing problems -stomach or side pain, or pain   at the shoulder -swelling -tiredness -trouble passing urine or change in the amount of urine Side effects that usually do not require medical attention (report to your doctor or health care professional if they continue or are  bothersome): -bone pain -muscle pain This list may not describe all possible side effects. Call your doctor for medical advice about side effects. You may report side effects to FDA at 1-800-FDA-1088. Where should I keep my medicine? Keep out of the reach of children. Store pre-filled syringes in a refrigerator between 2 and 8 degrees C (36 and 46 degrees F). Do not freeze. Keep in carton to protect from light. Throw away this medicine if it is left out of the refrigerator for more than 48 hours. Throw away any unused medicine after the expiration date. NOTE: This sheet is a summary. It may not cover all possible information. If you have questions about this medicine, talk to your doctor, pharmacist, or health care provider.    2016, Elsevier/Gold Standard. (2014-02-25 14:30:14)  

## 2015-12-26 ENCOUNTER — Encounter (HOSPITAL_COMMUNITY): Payer: PPO

## 2015-12-26 ENCOUNTER — Ambulatory Visit (HOSPITAL_COMMUNITY): Payer: PPO

## 2015-12-26 ENCOUNTER — Encounter: Payer: Self-pay | Admitting: Oncology

## 2015-12-27 ENCOUNTER — Telehealth: Payer: Self-pay | Admitting: *Deleted

## 2015-12-27 NOTE — Telephone Encounter (Signed)
Spoke with [name].she needs her teeth cleaned. Just had chemo on 12/21/15. Suggested she wait until just before her next treatment to ensure her counts have improved. Dr Evelene Croon may fax a note to [phone] to request clearance for teeth cleaning.

## 2015-12-28 ENCOUNTER — Encounter (HOSPITAL_COMMUNITY): Payer: PPO

## 2015-12-28 ENCOUNTER — Ambulatory Visit (HOSPITAL_COMMUNITY): Payer: PPO

## 2015-12-29 ENCOUNTER — Encounter (HOSPITAL_COMMUNITY): Payer: Self-pay | Admitting: *Deleted

## 2015-12-29 ENCOUNTER — Telehealth (HOSPITAL_COMMUNITY): Payer: Self-pay | Admitting: *Deleted

## 2015-12-29 DIAGNOSIS — Z955 Presence of coronary angioplasty implant and graft: Secondary | ICD-10-CM

## 2015-12-29 DIAGNOSIS — I214 Non-ST elevation (NSTEMI) myocardial infarction: Secondary | ICD-10-CM

## 2015-12-29 NOTE — Progress Notes (Signed)
Cardiac Individual Treatment Plan  Patient Details  Name: [name] MRN: [id number] Date of Birth: Dec 25, 1955 Referring Provider:   Flowsheet Row CARDIAC REHAB PHASE II ORIENTATION from 12/08/2015 in [facility]  Referring Provider  Dorris Carnes MD      Initial Encounter Date:  [place] PHASE II ORIENTATION from 12/08/2015 in [facility]  Date  12/08/15  Referring Provider  Dorris Carnes MD      Visit Diagnosis: 10/26/15 NSTEMI (non-ST elevated myocardial infarction) ([place])  10/26/15 Status post coronary artery stent placement  Patient's Home Medications on Admission:  Current Outpatient Prescriptions:  .  Acetylcarnitine HCl (ACETYL L-CARNITINE PO), Take 2,000 mg by mouth at bedtime., Disp: , Rfl:  .  ALPRAZolam (XANAX) 0.5 MG tablet, Take 0.5 mg by mouth 2 (two) times daily as needed for anxiety., Disp: , Rfl:  .  aspirin 81 MG chewable tablet, Chew 1 tablet (81 mg total) by mouth daily., Disp: 30 tablet, Rfl: 0 .  atorvastatin (LIPITOR) 40 MG tablet, Take 1 tablet (40 mg total) by mouth daily at 6 PM., Disp: 30 tablet, Rfl: 0 .  carvedilol (COREG) 3.125 MG tablet, Take 0.5 tablets (1.56 mg total) by mouth 2 (two) times daily with a meal., Disp: 30 tablet, Rfl: 3 .  clopidogrel (PLAVIX) 75 MG tablet, Take 1 tablet (75 mg total) by mouth daily with breakfast., Disp: 30 tablet, Rfl: 0 .  divalproex (DEPAKOTE) 250 MG DR tablet, Take by mouth. Take 500mg s in the morning and 750mg s at night, Disp: , Rfl:  .  fluticasone (FLONASE) 50 MCG/ACT nasal spray, INHALE 2 SPRAYS INTO EACH NOSTRIL EVERY NIGHT, Disp: , Rfl: 11 .  gabapentin (NEURONTIN) 100 MG capsule, Take 1 capsule (100 mg total) by mouth 3 (three) times daily. (Patient taking differently: Take 100 mg by mouth 2 (two) times daily. ), Disp: 90 capsule, Rfl: 1 .  isosorbide mononitrate (IMDUR) 30 MG 24 hr tablet, Take 1 tablet (30 mg total) by mouth  daily., Disp: 90 tablet, Rfl: 3 .  lamoTRIgine (LAMICTAL) 100 MG tablet, Take 100 mg by mouth 2 (two) times daily., Disp: , Rfl:  .  levothyroxine (SYNTHROID, LEVOTHROID) 100 MCG tablet, Take 100 mcg by mouth daily before breakfast. , Disp: , Rfl:  .  lidocaine-prilocaine (EMLA) cream, Apply topically as needed. Apply to port with every chemotherapy., Disp: 30 g, Rfl: 0 .  lisinopril (PRINIVIL,ZESTRIL) 2.5 MG tablet, Take 1 tablet (2.5 mg total) by mouth daily., Disp: 30 tablet, Rfl: 0 .  loperamide (IMODIUM A-D) 2 MG tablet, Take 2 mg by mouth as needed for diarrhea or loose stools., Disp: , Rfl:  .  loratadine (CLARITIN) 10 MG tablet, Take 10 mg by mouth daily as needed for allergies (with chemo)., Disp: , Rfl:  .  nitroGLYCERIN (NITROSTAT) 0.4 MG SL tablet, Place 1 tablet (0.4 mg total) under the tongue every 5 (five) minutes as needed for chest pain., Disp: 25 tablet, Rfl: 3 .  ondansetron (ZOFRAN) 8 MG tablet, TAKE 1 TABLET BY MOUTH EVERY 8 HOURS AS NEEDED FOR NAUSEA AND VOMITING, Disp: 20 tablet, Rfl: 1 .  pantoprazole (PROTONIX) 40 MG tablet, Take 1 tablet (40 mg total) by mouth daily., Disp: 30 tablet, Rfl: 11 .  rOPINIRole (REQUIP) 0.5 MG tablet, Take 1.5 mg by mouth at bedtime. , Disp: , Rfl:  No current facility-administered medications for this visit.   Facility-Administered Medications Ordered in Other Visits:  .  heparin lock flush 100 unit/mL, 500 Units, Intravenous, Once, Wyatt Portela, MD .  sodium chloride flush (NS) 0.9 % injection 10 mL, 10 mL, Intravenous, PRN, Wyatt Portela, MD  Past Medical History: Past Medical History:  Diagnosis Date  . ADD (attention deficit disorder)   . Allergy    seasonal  . Anxiety   . Bipolar disorder ([place])   . Breast cancer ([place]) 1989   right breast  . 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 Coronary artery disease   . Depression   . Diabetes mellitus    pt denies DM noe meds  . Hematochezia   . History of echocardiogram    a. Limited Echo  9/17: EF 60-65%, no RWMA, Gr 1 DD, trivial AI, trivial MR, GLS -12% (likely underestimated), no pericardial effusion  . Hyperlipidemia   . Hypothyroidism   . Maintenance chemotherapy    Pt has chemo every 3 weeks (on Friday)  . NSTEMI (non-ST elevated myocardial infarction) ([place]) 10/26/2015  . Osteopenia 03/2009   t score -2.1 FRAX 4.6/0.4  . Premature menopause   . Sleep apnea    mild    Tobacco Use: History  Smoking Status  . Never Smoker  Smokeless Tobacco  . Never Used    Labs: Recent Review Flowsheet Data    Labs for ITP Cardiac and Pulmonary Rehab Latest Ref Rng & Units 05/19/2009 08/24/2009 11/04/2009 02/08/2010 10/26/2015   Cholestrol 0 - 200 mg/dL 246(H) 230(H) - - 161   LDLCALC 0 - 99 mg/dL - - - - 91   LDLDIRECT mg/dL 176.9 151.3 - - -   HDL >40 mg/dL 63.30 59.60 - - 38(L)   Trlycerides <150 mg/dL 145.0 165.0(H) - - 162(H)   Hemoglobin A1c 4.6 - 6.5 % - 5.7 5.7(H) 5.3 -      Capillary Blood Glucose: Lab Results  Component Value Date   GLUCAP 71 11/08/2015   GLUCAP 85 10/27/2015   GLUCAP 110 (H) 10/27/2015   GLUCAP 86 10/26/2015   GLUCAP 83 09/22/2013     Exercise Target Goals:    Exercise Program Goal: Individual exercise prescription set with THRR, safety & activity barriers. Participant demonstrates ability to understand and report RPE using BORG scale, to self-measure pulse accurately, and to acknowledge the importance of the exercise prescription.  Exercise Prescription Goal: Starting with aerobic activity 30 plus minutes a day, 3 days per week for initial exercise prescription. Provide home exercise prescription and guidelines that participant acknowledges understanding prior to discharge.  Activity Barriers & Risk Stratification:     Activity Barriers & Cardiac Risk Stratification - 12/08/15 1502      Activity Barriers & Cardiac Risk Stratification   Cardiac Risk Stratification High      6 Minute Walk:     6 Minute Walk    Row Name  12/08/15 1142         6 Minute Walk   Phase Initial     Distance 1169 feet     Walk Time 6 minutes     MPH 2.21     METS 2.96     RPE 13     Perceived Dyspnea  0     VO2 Peak 10.34     Symptoms No     Resting HR 79 bpm     Resting BP 104/60     Max Ex. HR 98 bpm     Max Ex. BP 123/76     2 Minute Post BP  96/61        Initial Exercise Prescription:     Initial Exercise Prescription - 12/08/15 1100      Date of Initial Exercise RX and Referring Provider   Date 12/08/15   Referring Provider Dorris Carnes MD     Recumbant Bike   Level 1   Watts 22.7   Minutes 10   METs 2.9     NuStep   Level 1   Minutes 10   METs 1.7     Track   Laps 8   Minutes 10   METs 2.39     Prescription Details   Frequency (times per week) 3   Duration Progress to 45 minutes of aerobic exercise without signs/symptoms of physical distress     Intensity   THRR 40-80% of Max Heartrate 65-130   Ratings of Perceived Exertion 11-13   Perceived Dyspnea 0-4     Progression   Progression Continue to progress workloads to maintain intensity without signs/symptoms of physical distress.     Resistance Training   Training Prescription No      Perform Capillary Blood Glucose checks as needed.  Exercise Prescription Changes:     Exercise Prescription Changes    Row Name 12/28/15 1700             Progression   Average METs 2.2         Resistance Training   Training Prescription No         Recumbant Bike   Level 1       Watts 22.7       Minutes 10       METs 2.9         NuStep   Level 1       Minutes 10       METs 1.7         Track   Laps 8       Minutes 10       METs 2.39          Exercise Comments:     Exercise Comments    Row Name 12/28/15 1735           Exercise Comments Pt has been absent recently from [facility] due to chemo and personal concerns.  Will follow up and wait to hear back from pt.           Discharge Exercise Prescription (Final Exercise  Prescription Changes):     Exercise Prescription Changes - 12/28/15 1700      Progression   Average METs 2.2     Resistance Training   Training Prescription No     Recumbant Bike   Level 1   Watts 22.7   Minutes 10   METs 2.9     NuStep   Level 1   Minutes 10   METs 1.7     Track   Laps 8   Minutes 10   METs 2.39      Nutrition:  Target Goals: Understanding of nutrition guidelines, daily intake of sodium 1500mg , cholesterol 200mg , calories 30% from fat and 7% or less from saturated fats, daily to have 5 or more servings of fruits and vegetables.  Biometrics:     Pre Biometrics - 12/08/15 1209      Pre Biometrics   Waist Circumference 41.5 inches   Hip Circumference 42 inches   Waist to Hip Ratio 0.99 %   Triceps Skinfold 26 mm   % Body  Fat 42.6 %   Grip Strength 21 kg   Flexibility 8.25 in   Single Leg Stand 1.3 seconds       Nutrition Therapy Plan and Nutrition Goals:     Nutrition Therapy & Goals - 12/12/15 1009      Nutrition Therapy   Diet Carb Modified, Therapeutic Lifestyle Changes     Personal Nutrition Goals   Personal Goal #1 1-2 lb wt loss/week to a wt loss goal of 6-24 lb at graduation from [place], educate and counsel regarding individualized specific dietary modifications aiming towards targeted core components such as weight, hypertension, lipid management, diabetes, heart failure and other comorbidities.   Expected Outcomes Short Term Goal: Understand basic principles of dietary content, such as calories, fat, sodium, cholesterol and nutrients.;Long Term Goal: Adherence to prescribed nutrition plan.      Nutrition Discharge: Nutrition Scores:   Nutrition Goals Re-Evaluation:   Psychosocial: Target Goals: Acknowledge presence or absence of depression, maximize coping skills, provide positive support system. Participant is able to verbalize types and ability to use techniques  and skills needed for reducing stress and depression.  Initial Review & Psychosocial Screening:     Initial Psych Review & Screening - 12/12/15 [place]? Yes     Barriers   Psychosocial barriers to participate in program The patient should benefit from training in stress management and relaxation.     Screening Interventions   Interventions Encouraged to exercise      Quality of Life Scores:     Quality of Life - 12/13/15 1208      Quality of Life Scores   Health/Function Pre --  Jihan is dissatisfied with her health due to her CAD and Cancer   Socioeconomic Pre --  Worried about finances due to recent hospitalization and medical bills.   Psych/Spiritual Pre --  Un happy does not have a lot of faith in spirituality due to current state of health   GLOBAL Pre --  Sanya has been depressed. Denies feeling suicidal . Lakya sees a therapist  at [facility]. See's Trinna Balloon      PHQ-9: Recent Review Flowsheet Data    Depression screen [facility] 2/9 12/12/2015 12/07/2015   Decreased Interest 1 0   Down, Depressed, Hopeless 1 1   PHQ - 2 Score 2 1   Altered sleeping 0 -   Tired, decreased energy 1 -   Change in appetite 0 -   Feeling bad or failure about yourself  0 -   Trouble concentrating 0 -   Moving slowly or fidgety/restless 1 -   Suicidal thoughts 0 -   PHQ-9 Score 4 -   Difficult doing work/chores Somewhat difficult -      Psychosocial Evaluation and Intervention:   Psychosocial Re-Evaluation:   Vocational Rehabilitation: Provide vocational rehab assistance to qualifying candidates.   Vocational Rehab Evaluation & Intervention:     Vocational Rehab - 12/12/15 1717      Initial Vocational Rehab Evaluation & Intervention   Assessment shows need for Vocational Rehabilitation No  Alaira is on disability and is not currently working.      Education: Education Goals: Education classes will be provided  on a weekly basis, covering required topics. Participant will state understanding/return demonstration of topics presented.  Learning Barriers/Preferences:     Learning Barriers/Preferences - 12/08/15 0845  Learning Barriers/Preferences   Learning Barriers Sight;Hearing   Learning Preferences Individual Instruction      Education Topics: Count Your Pulse:  -Group instruction provided by verbal instruction, demonstration, patient participation and written materials to support subject.  Instructors address importance of being able to find your pulse and how to count your pulse when at home without a heart monitor.  Patients get hands on experience counting their pulse with staff help and individually.   Heart Attack, Angina, and Risk Factor Modification:  -Group instruction provided by verbal instruction, video, and written materials to support subject.  Instructors address signs and symptoms of angina and heart attacks.    Also discuss risk factors for heart disease and how to make changes to improve heart health risk factors.   Functional Fitness:  -Group instruction provided by verbal instruction, demonstration, patient participation, and written materials to support subject.  Instructors address safety measures for doing things around the house.  Discuss how to get up and down off the floor, how to pick things up properly, how to safely get out of a chair without assistance, and balance training.   Meditation and Mindfulness:  -Group instruction provided by verbal instruction, patient participation, and written materials to support subject.  Instructor addresses importance of mindfulness and meditation practice to help reduce stress and improve awareness.  Instructor also leads participants through a meditation exercise.  Flowsheet Row CARDIAC REHAB PHASE II EXERCISE from 12/21/2015 in [facility]  Date  12/14/15  Educator  [facility]  Instruction  Review Code  2- meets goals/outcomes      Stretching for Flexibility and Mobility:  -Group instruction provided by verbal instruction, patient participation, and written materials to support subject.  Instructors lead participants through series of stretches that are designed to increase flexibility thus improving mobility.  These stretches are additional exercise for major muscle groups that are typically performed during regular warm up and cool down. Flowsheet Row CARDIAC REHAB PHASE II EXERCISE from 12/21/2015 in [facility]  Date  12/16/15  Instruction Review Code  2- meets goals/outcomes      Hands Only CPR Anytime:  -Group instruction provided by verbal instruction, video, patient participation and written materials to support subject.  Instructors co-teach with AHA video for hands only CPR.  Participants get hands on experience with mannequins.   Nutrition I class: Heart Healthy Eating:  -Group instruction provided by PowerPoint slides, verbal discussion, and written materials to support subject matter. The instructor gives an explanation and review of the Therapeutic Lifestyle Changes diet recommendations, which includes a discussion on lipid goals, dietary fat, sodium, fiber, plant stanol/sterol esters, sugar, and the components of a well-balanced, healthy diet.   Nutrition II class: Lifestyle Skills:  -Group instruction provided by PowerPoint slides, verbal discussion, and written materials to support subject matter. The instructor gives an explanation and review of label reading, grocery shopping for heart health, heart healthy recipe modifications, and ways to make healthier choices when eating out.   Diabetes Question & Answer:  -Group instruction provided by PowerPoint slides, verbal discussion, and written materials to support subject matter. The instructor gives an explanation and review of diabetes co-morbidities, pre- and post-prandial blood  glucose goals, pre-exercise blood glucose goals, signs, symptoms, and treatment of hypoglycemia and hyperglycemia, and foot care basics.   Diabetes Blitz:  -Group instruction provided by PowerPoint slides, verbal discussion, and written materials to support subject matter. The instructor gives an explanation and  review of the physiology behind type 1 and type 2 diabetes, diabetes medications and rational behind using different medications, pre- and post-prandial blood glucose recommendations and Hemoglobin A1c goals, diabetes diet, and exercise including blood glucose guidelines for exercising safely.    Portion Distortion:  -Group instruction provided by PowerPoint slides, verbal discussion, written materials, and food models to support subject matter. The instructor gives an explanation of serving size versus portion size, changes in portions sizes over the last 20 years, and what consists of a serving from each food group.   Stress Management:  -Group instruction provided by verbal instruction, video, and written materials to support subject matter.  Instructors review role of stress in heart disease and how to cope with stress positively.     Exercising on Your Own:  -Group instruction provided by verbal instruction, power point, and written materials to support subject.  Instructors discuss benefits of exercise, components of exercise, frequency and intensity of exercise, and end points for exercise.  Also discuss use of nitroglycerin and activating EMS.  Review options of places to exercise outside of rehab.  Review guidelines for sex with heart disease.   Cardiac Drugs I:  -Group instruction provided by verbal instruction and written materials to support subject.  Instructor reviews cardiac drug classes: antiplatelets, anticoagulants, beta blockers, and statins.  Instructor discusses reasons, side effects, and lifestyle considerations for each drug class.   Cardiac Drugs II:  -Group  instruction provided by verbal instruction and written materials to support subject.  Instructor reviews cardiac drug classes: angiotensin converting enzyme inhibitors (ACE-I), angiotensin II receptor blockers (ARBs), nitrates, and calcium channel blockers.  Instructor discusses reasons, side effects, and lifestyle considerations for each drug class.   Anatomy and Physiology of the Circulatory System:  -Group instruction provided by verbal instruction, video, and written materials to support subject.  Reviews functional anatomy of heart, how it relates to various diagnoses, and what role the heart plays in the overall system. Flowsheet Row CARDIAC REHAB PHASE II EXERCISE from 12/21/2015 in [facility]  Date  12/21/15  Instruction Review Code  2- meets goals/outcomes      Knowledge Questionnaire Score:     Knowledge Questionnaire Score - 12/12/15 1706      Knowledge Questionnaire Score   Pre Score 22/24      Core Components/Risk Factors/Patient Goals at Admission:     Personal Goals and Risk Factors at Admission - 12/08/15 1536      Core Components/Risk Factors/Patient Goals on Admission   Intervention Provide an individualized exercise program and small group education classes.        Core Components/Risk Factors/Patient Goals Review:      Goals and Risk Factor Review    Row Name 12/08/15 1516             Core Components/Risk Factors/Patient Goals Review   Personal Goals Review Increase Strength and Stamina;Other;Weight Management/Obesity       Review Pt wants to increase quality of breathing w/ singing and increase stamina       Expected Outcomes Improve overall functional capacity           Core Components/Risk Factors/Patient Goals at Discharge (Final Review):      Goals and Risk Factor Review - 12/08/15 1516      Core Components/Risk Factors/Patient Goals Review   Personal Goals Review Increase Strength and Stamina;Other;Weight  Management/Obesity   Review Pt wants to increase quality of breathing w/ singing and increase stamina  Expected Outcomes Improve overall functional capacity       ITP Comments:     ITP Comments    Row Name 12/08/15 1501           ITP Comments Dr. Fransico Him, Medical Director          Comments: Butch Penny completed  4 sessions. Recommend continued exercise and life style modification education including  stress management and relaxation techniques to decrease cardiac risk profile. Bianco's is currently holding her exercise. Eren says she is going to take a break for a few weeks and will let us know when she is ready to return.Barnet Pall, RN,BSN 12/29/2015 1:36 PM

## 2015-12-30 ENCOUNTER — Encounter: Payer: Self-pay | Admitting: Oncology

## 2015-12-30 ENCOUNTER — Ambulatory Visit (HOSPITAL_COMMUNITY): Payer: PPO

## 2015-12-30 ENCOUNTER — Encounter (HOSPITAL_COMMUNITY): Payer: PPO

## 2016-01-02 ENCOUNTER — Ambulatory Visit (HOSPITAL_COMMUNITY): Payer: PPO

## 2016-01-02 ENCOUNTER — Encounter: Payer: Self-pay | Admitting: Oncology

## 2016-01-02 ENCOUNTER — Other Ambulatory Visit: Payer: Self-pay | Admitting: *Deleted

## 2016-01-02 ENCOUNTER — Encounter (HOSPITAL_COMMUNITY): Payer: PPO

## 2016-01-02 MED ORDER — GABAPENTIN 100 MG PO CAPS: 100.0000 mg | ORAL_CAPSULE | Freq: Three times a day (TID) | ORAL | 2 refills | Status: DC

## 2016-01-02 MED ORDER — ONDANSETRON HCL 8 MG PO TABS: ORAL_TABLET | ORAL | 2 refills | Status: DC

## 2016-01-03 ENCOUNTER — Ambulatory Visit (INDEPENDENT_AMBULATORY_CARE_PROVIDER_SITE_OTHER): Payer: PPO | Admitting: Family Medicine

## 2016-01-03 ENCOUNTER — Telehealth: Payer: Self-pay | Admitting: Family Medicine

## 2016-01-03 ENCOUNTER — Encounter: Payer: Self-pay | Admitting: Family Medicine

## 2016-01-03 VITALS — BP 120/68 | HR 89 | Temp 98.1°F | Resp 12 | Ht 61.0 in | Wt 161.5 lb

## 2016-01-03 DIAGNOSIS — L03313 Cellulitis of chest wall: Secondary | ICD-10-CM

## 2016-01-03 MED ORDER — CEFTRIAXONE SODIUM 1 G IJ SOLR
1.0000 g | Freq: Once | INTRAMUSCULAR | Status: AC
  Administered 2016-01-03: 1 g via INTRAMUSCULAR

## 2016-01-03 MED ORDER — HYDROCODONE-ACETAMINOPHEN 5-325 MG PO TABS: 1.0000 | ORAL_TABLET | Freq: Three times a day (TID) | ORAL | 0 refills | Status: DC | PRN

## 2016-01-03 MED ORDER — SULFAMETHOXAZOLE-TRIMETHOPRIM 800-160 MG PO TABS: 1.0000 | ORAL_TABLET | Freq: Two times a day (BID) | ORAL | 0 refills | Status: DC

## 2016-01-03 NOTE — Telephone Encounter (Signed)
Patient is seeing [place] this afternoon.

## 2016-01-03 NOTE — Patient Instructions (Addendum)
A few things to remember from today's visit:   Cellulitis of chest wall - Plan: sulfamethoxazole-trimethoprim (BACTRIM DS,SEPTRA DS) 800-160 MG tablet, cefTRIAXone (ROCEPHIN) injection 1 g, HYDROcodone-acetaminophen (NORCO/VICODIN) 5-325 MG tablet  Warm compressions. If sudden worsening symptoms please seek immediate medical attention.   Please be sure medication list is accurate. If a new problem present, please set up appointment sooner than planned today.

## 2016-01-03 NOTE — Progress Notes (Signed)
Pre visit review using our clinic review tool, if applicable. No additional management support is needed unless otherwise documented below in the visit note. 

## 2016-01-03 NOTE — Telephone Encounter (Signed)
Patient Name: [name]  DOB: 1955-07-05    Initial Comment caller states she has a scar that is sore and thinks there may be infection under it   Nurse Assessment  Nurse: Julien Girt, RN, Almyra Free Date/Time Eilene Ghazi Time): 01/03/2016 9:37:42 AM  Confirm and document reason for call. If symptomatic, describe symptoms. You must click the next button to save text entered. ---Caller states she has a scar on her chest, (lumpectomy) that goes in to her breast, that has been sore and looks like a boil. It is the size of a dime and very painful. No fever or drainage at this time.  Has the patient traveled out of the country within the last 30 days? ---Not Applicable  Does the patient have any new or worsening symptoms? ---Yes  Will a triage be completed? ---Yes  Related visit to physician within the last 2 weeks? ---No  Does the PT have any chronic conditions? (i.e. diabetes, asthma, etc.) ---Yes  List chronic conditions. ---Breast/Omentum [place], [place] 10/25/2015  Is this a behavioral health or substance abuse call? ---No     Guidelines    Guideline Title Affirmed Question Affirmed Notes  Sores [1] Looks infected (spreading redness, pus) AND [2] diabetes mellitus or weak immune system (e.g., HIV positive, cancer chemotherapy, chronic steroid treatment, splenectomy)    Final Disposition User   See Physician within 4 Hours (or PCP triage) Julien Girt, RN, Almyra Free    Referrals  REFERRED TO PCP OFFICE   Disagree/Comply: Leta Baptist

## 2016-01-03 NOTE — Progress Notes (Addendum)
HPI:  ACUTE VISIT:  Chief Complaint  Patient presents with  . infected sore    [name] is a 60 y.o. female, who is here today complaining of tenderness and erythema on mid sternal area, on scar from prior mastectomy. She noted lesion 01/02/16, pain is getting worse.  Pain is described as soreness, "really bad", constant, exacerbated by palpation,lying down, and movement. Alleviated by sitting.  She has not noted fever or chills, she has some muscle aching. She denies any trauma. She wonders if problem is related with long neckless she has been waiting for the past 2-3 weeks, charm is bottle shape to carry nitroglycerine that lies at same level of skin lesion.  No cough, wheezing, or dyspnea.  Hx of peritoneal carcinomatosis and breast cancer, currently under ongoing chemotherapy.    Review of Systems  Constitutional: Positive for fatigue. Negative for appetite change, chills and fever.  HENT: Negative for mouth sores, sore throat and trouble swallowing.   Respiratory: Negative for cough, shortness of breath and wheezing.   Cardiovascular: Positive for chest pain. Negative for palpitations.  Musculoskeletal: Positive for myalgias.  Skin: Positive for rash. Negative for wound.  Psychiatric/Behavioral: Negative for confusion. The patient is not nervous/anxious.       Current Outpatient Prescriptions on File Prior to Visit  Medication Sig Dispense Refill  . Acetylcarnitine HCl (ACETYL L-CARNITINE PO) Take 2,000 mg by mouth at bedtime.    . ALPRAZolam (XANAX) 0.5 MG tablet Take 0.5 mg by mouth 2 (two) times daily as needed for anxiety.    Marland Kitchen aspirin 81 MG chewable tablet Chew 1 tablet (81 mg total) by mouth daily. 30 tablet 0  . atorvastatin (LIPITOR) 40 MG tablet Take 1 tablet (40 mg total) by mouth daily at 6 PM. 30 tablet 0  . carvedilol (COREG) 3.125 MG tablet Take 0.5 tablets (1.56 mg total) by mouth 2 (two) times daily with a meal. 30 tablet 3  .  clopidogrel (PLAVIX) 75 MG tablet Take 1 tablet (75 mg total) by mouth daily with breakfast. 30 tablet 0  . divalproex (DEPAKOTE) 250 MG DR tablet Take by mouth. Take 500mg s in the morning and 750mg s at night    . fluticasone (FLONASE) 50 MCG/ACT nasal spray INHALE 2 SPRAYS INTO EACH NOSTRIL EVERY NIGHT  11  . gabapentin (NEURONTIN) 100 MG capsule Take 1 capsule (100 mg total) by mouth 3 (three) times daily. 90 capsule 2  . isosorbide mononitrate (IMDUR) 30 MG 24 hr tablet Take 1 tablet (30 mg total) by mouth daily. 90 tablet 3  . lamoTRIgine (LAMICTAL) 100 MG tablet Take 100 mg by mouth 2 (two) times daily.    Marland Kitchen levothyroxine (SYNTHROID, LEVOTHROID) 100 MCG tablet Take 100 mcg by mouth daily before breakfast.     . lidocaine-prilocaine (EMLA) cream Apply topically as needed. Apply to port with every chemotherapy. 30 g 0  . lisinopril (PRINIVIL,ZESTRIL) 2.5 MG tablet Take 1 tablet (2.5 mg total) by mouth daily. 30 tablet 0  . loperamide (IMODIUM A-D) 2 MG tablet Take 2 mg by mouth as needed for diarrhea or loose stools.    Marland Kitchen loratadine (CLARITIN) 10 MG tablet Take 10 mg by mouth daily as needed for allergies (with chemo).    . nitroGLYCERIN (NITROSTAT) 0.4 MG SL tablet Place 1 tablet (0.4 mg total) under the tongue every 5 (five) minutes as needed for chest pain. 25 tablet 3  . ondansetron (ZOFRAN) 8 MG tablet TAKE 1 TABLET BY  MOUTH EVERY 8 HOURS AS NEEDED FOR NAUSEA AND VOMITING 30 tablet 2  . pantoprazole (PROTONIX) 40 MG tablet Take 1 tablet (40 mg total) by mouth daily. 30 tablet 11  . rOPINIRole (REQUIP) 0.5 MG tablet Take 1.5 mg by mouth at bedtime.      Current Facility-Administered Medications on File Prior to Visit  Medication Dose Route Frequency Provider Last Rate Last Dose  . heparin lock flush 100 unit/mL  500 Units Intravenous Once Wyatt Portela, MD      . sodium chloride flush (NS) 0.9 % injection 10 mL  10 mL Intravenous PRN Wyatt Portela, MD         Past Medical History:    Diagnosis Date  . ADD (attention deficit disorder)   . Allergy    seasonal  . Anxiety   . Bipolar disorder ([place])   . Breast cancer ([place]) 1989   right breast  . Cancer ([place])    Omentum  . Coronary artery disease   . Depression   . Diabetes mellitus    pt denies DM noe meds  . Hematochezia   . History of echocardiogram    a. Limited Echo 9/17: EF 60-65%, no RWMA, Gr 1 DD, trivial AI, trivial MR, GLS -12% (likely underestimated), no pericardial effusion  . Hyperlipidemia   . Hypothyroidism   . Maintenance chemotherapy    Pt has chemo every 3 weeks (on Friday)  . NSTEMI (non-ST elevated myocardial infarction) ([place]) 10/26/2015  . Osteopenia 03/2009   t score -2.1 FRAX 4.6/0.4  . Premature menopause   . Sleep apnea    mild   Allergies  Allergen Reactions  . Doxil [Doxorubicin Hcl Liposomal] Anaphylaxis    1st Doxil.   . Pollen Extract Other (See Comments)    Pollen and grass causes a lot sneezing    Social History   Social History  . Marital status: Married    Spouse name: [name]  . Number of children: 0  . Years of education: 14   Occupational History  . unemployed [employer], [place]   Social History Main Topics  . Smoking status: Never Smoker  . Smokeless tobacco: Never Used  . Alcohol use 0.0 oz/week     Comment: very rarely - maybe 1 glass wine every 3 mos  . Drug use: No  . Sexual activity: Not Currently    Birth control/ protection: Post-menopausal     Comment: 1st intercourse 60 yo-5 partners   Other Topics Concern  . None   Social History Narrative   Lives at home with husband.    Caffeine use:  Tea/soda occass    Vitals:   01/03/16 1531  BP: 120/68  Pulse: 89  Resp: 12  Temp: 98.1 F (36.7 C)   Body mass index is 30.52 kg/m.    Physical Exam  Nursing note and vitals reviewed. Constitutional: She is oriented to person, place, and time. She appears well-developed. No distress.  HENT:  Head: Atraumatic.  Mouth/Throat:  Oropharynx is clear and moist and mucous membranes are normal.  Eyes: Conjunctivae are normal.  Respiratory: Effort normal and breath sounds normal. No respiratory distress.  Neurological: She is alert and oriented to person, place, and time. She has normal strength. Coordination normal.  Skin: Skin is warm. No ecchymosis noted. There is erythema.     Indurated, tender, erythematous area on mid sternal area, more towards left. Lesion is on thin skin and hypertrophic scar. No fluctuant area. 5-6 cm,  and local heat.  Psychiatric: She has a normal mood and affect. Her speech is normal.  Well groomed, good eye contact.      ASSESSMENT AND PLAN:     [name] was seen today for infected sore.  Diagnoses and all orders for this visit:  Cellulitis of chest wall -     sulfamethoxazole-trimethoprim (BACTRIM DS,SEPTRA DS) 800-160 MG tablet; Take 1 tablet by mouth 2 (two) times daily. -     cefTRIAXone (ROCEPHIN) injection 1 g; Inject 1 g into the muscle once. -     HYDROcodone-acetaminophen (NORCO/VICODIN) 5-325 MG tablet; Take 1 tablet by mouth every 8 (eight) hours as needed for moderate pain.   Here in the office after verbal consent she received Rocephin 1 g IM. She will start Bactrim DS every 12 hours. Local heat might help. Hydrocodone/Acetaminophen 5 mg/325 mg qid prn for pain. Some side effects of both medications discussed. She follows with surgeon, according to patient, she tried to set up appt but she cannot be seen until January 2018. If abscess develops and she needs I&D, she is going to need to follow with surgery. Clearly instructed about warning signs.    Return in about 1 week (around 01/10/2016) for chest wall cellulitis.    -[name] advised to return or notify a doctor immediately if symptoms worsen or new concerns arise, she voices understanding.       Julie Presas G. [name], MD  [facility]. [place] office.

## 2016-01-04 ENCOUNTER — Other Ambulatory Visit: Payer: Self-pay | Admitting: Pharmacist

## 2016-01-04 ENCOUNTER — Telehealth: Payer: Self-pay | Admitting: Family Medicine

## 2016-01-04 ENCOUNTER — Emergency Department (HOSPITAL_COMMUNITY): Payer: PPO

## 2016-01-04 ENCOUNTER — Encounter (HOSPITAL_COMMUNITY): Payer: Self-pay

## 2016-01-04 ENCOUNTER — Inpatient Hospital Stay (HOSPITAL_COMMUNITY)
Admission: EM | Admit: 2016-01-04 | Discharge: 2016-01-11 | DRG: 872 | Disposition: A | Discharge status: Home Health | Payer: PPO | Attending: Internal Medicine | Admitting: Internal Medicine

## 2016-01-04 ENCOUNTER — Ambulatory Visit (HOSPITAL_COMMUNITY): Payer: PPO

## 2016-01-04 ENCOUNTER — Encounter (HOSPITAL_COMMUNITY): Payer: PPO

## 2016-01-04 DIAGNOSIS — I2510 Atherosclerotic heart disease of native coronary artery without angina pectoris: Secondary | ICD-10-CM | POA: Diagnosis not present

## 2016-01-04 DIAGNOSIS — I959 Hypotension, unspecified: Secondary | ICD-10-CM | POA: Diagnosis not present

## 2016-01-04 DIAGNOSIS — N611 Abscess of the breast and nipple: Secondary | ICD-10-CM | POA: Diagnosis not present

## 2016-01-04 DIAGNOSIS — E039 Hypothyroidism, unspecified: Secondary | ICD-10-CM | POA: Diagnosis present

## 2016-01-04 DIAGNOSIS — Z853 Personal history of malignant neoplasm of breast: Secondary | ICD-10-CM | POA: Diagnosis not present

## 2016-01-04 DIAGNOSIS — K219 Gastro-esophageal reflux disease without esophagitis: Secondary | ICD-10-CM | POA: Diagnosis present

## 2016-01-04 DIAGNOSIS — Z170 Estrogen receptor positive status [ER+]: Secondary | ICD-10-CM

## 2016-01-04 DIAGNOSIS — I252 Old myocardial infarction: Secondary | ICD-10-CM | POA: Diagnosis not present

## 2016-01-04 DIAGNOSIS — C482 Malignant neoplasm of peritoneum, unspecified: Secondary | ICD-10-CM | POA: Diagnosis not present

## 2016-01-04 DIAGNOSIS — Z7902 Long term (current) use of antithrombotics/antiplatelets: Secondary | ICD-10-CM

## 2016-01-04 DIAGNOSIS — A419 Sepsis, unspecified organism: Secondary | ICD-10-CM | POA: Diagnosis not present

## 2016-01-04 DIAGNOSIS — D539 Nutritional anemia, unspecified: Secondary | ICD-10-CM | POA: Diagnosis present

## 2016-01-04 DIAGNOSIS — F988 Other specified behavioral and emotional disorders with onset usually occurring in childhood and adolescence: Secondary | ICD-10-CM | POA: Diagnosis present

## 2016-01-04 DIAGNOSIS — T451X5A Adverse effect of antineoplastic and immunosuppressive drugs, initial encounter: Secondary | ICD-10-CM | POA: Diagnosis not present

## 2016-01-04 DIAGNOSIS — C569 Malignant neoplasm of unspecified ovary: Secondary | ICD-10-CM | POA: Diagnosis not present

## 2016-01-04 DIAGNOSIS — R509 Fever, unspecified: Secondary | ICD-10-CM | POA: Diagnosis not present

## 2016-01-04 DIAGNOSIS — Z833 Family history of diabetes mellitus: Secondary | ICD-10-CM

## 2016-01-04 DIAGNOSIS — Z8249 Family history of ischemic heart disease and other diseases of the circulatory system: Secondary | ICD-10-CM

## 2016-01-04 DIAGNOSIS — C786 Secondary malignant neoplasm of retroperitoneum and peritoneum: Secondary | ICD-10-CM | POA: Diagnosis not present

## 2016-01-04 DIAGNOSIS — Z955 Presence of coronary angioplasty implant and graft: Secondary | ICD-10-CM

## 2016-01-04 DIAGNOSIS — Z7951 Long term (current) use of inhaled steroids: Secondary | ICD-10-CM

## 2016-01-04 DIAGNOSIS — G2581 Restless legs syndrome: Secondary | ICD-10-CM | POA: Diagnosis present

## 2016-01-04 DIAGNOSIS — C50412 Malignant neoplasm of upper-outer quadrant of left female breast: Secondary | ICD-10-CM | POA: Diagnosis not present

## 2016-01-04 DIAGNOSIS — L0291 Cutaneous abscess, unspecified: Secondary | ICD-10-CM

## 2016-01-04 DIAGNOSIS — L03313 Cellulitis of chest wall: Secondary | ICD-10-CM

## 2016-01-04 DIAGNOSIS — E119 Type 2 diabetes mellitus without complications: Secondary | ICD-10-CM | POA: Diagnosis not present

## 2016-01-04 DIAGNOSIS — D6481 Anemia due to antineoplastic chemotherapy: Secondary | ICD-10-CM | POA: Diagnosis present

## 2016-01-04 DIAGNOSIS — D638 Anemia in other chronic diseases classified elsewhere: Secondary | ICD-10-CM | POA: Diagnosis present

## 2016-01-04 DIAGNOSIS — F329 Major depressive disorder, single episode, unspecified: Secondary | ICD-10-CM | POA: Diagnosis not present

## 2016-01-04 DIAGNOSIS — N61 Mastitis without abscess: Secondary | ICD-10-CM | POA: Diagnosis not present

## 2016-01-04 DIAGNOSIS — D696 Thrombocytopenia, unspecified: Secondary | ICD-10-CM | POA: Diagnosis not present

## 2016-01-04 DIAGNOSIS — Z800 Family history of malignant neoplasm of digestive organs: Secondary | ICD-10-CM

## 2016-01-04 DIAGNOSIS — Z79899 Other long term (current) drug therapy: Secondary | ICD-10-CM

## 2016-01-04 DIAGNOSIS — C50912 Malignant neoplasm of unspecified site of left female breast: Secondary | ICD-10-CM | POA: Diagnosis not present

## 2016-01-04 DIAGNOSIS — Z923 Personal history of irradiation: Secondary | ICD-10-CM

## 2016-01-04 DIAGNOSIS — E785 Hyperlipidemia, unspecified: Secondary | ICD-10-CM | POA: Diagnosis present

## 2016-01-04 DIAGNOSIS — Z881 Allergy status to other antibiotic agents status: Secondary | ICD-10-CM

## 2016-01-04 DIAGNOSIS — Z803 Family history of malignant neoplasm of breast: Secondary | ICD-10-CM

## 2016-01-04 DIAGNOSIS — C50919 Malignant neoplasm of unspecified site of unspecified female breast: Secondary | ICD-10-CM | POA: Diagnosis present

## 2016-01-04 DIAGNOSIS — R918 Other nonspecific abnormal finding of lung field: Secondary | ICD-10-CM | POA: Diagnosis not present

## 2016-01-04 DIAGNOSIS — Z7982 Long term (current) use of aspirin: Secondary | ICD-10-CM

## 2016-01-04 LAB — URINE MICROSCOPIC-ADD ON
BACTERIA UA: NONE SEEN
RBC / HPF: NONE SEEN RBC/hpf (ref 0–5)
SQUAMOUS EPITHELIAL / LPF: NONE SEEN

## 2016-01-04 LAB — URINALYSIS, ROUTINE W REFLEX MICROSCOPIC
Glucose, UA: NEGATIVE mg/dL
Hgb urine dipstick: NEGATIVE
Ketones, ur: NEGATIVE mg/dL
NITRITE: NEGATIVE
Protein, ur: NEGATIVE mg/dL
SPECIFIC GRAVITY, URINE: 1.034 — AB (ref 1.005–1.030)
pH: 6.5 (ref 5.0–8.0)

## 2016-01-04 LAB — COMPREHENSIVE METABOLIC PANEL
ALK PHOS: 98 U/L (ref 38–126)
ALT: 11 U/L — ABNORMAL LOW (ref 14–54)
ANION GAP: 7 (ref 5–15)
AST: 15 U/L (ref 15–41)
Albumin: 4.4 g/dL (ref 3.5–5.0)
BUN: 12 mg/dL (ref 6–20)
CALCIUM: 9.6 mg/dL (ref 8.9–10.3)
CO2: 28 mmol/L (ref 22–32)
Chloride: 102 mmol/L (ref 101–111)
Creatinine, Ser: 0.74 mg/dL (ref 0.44–1.00)
GFR calc non Af Amer: >60 mL/min (ref >60)
Glucose, Bld: 93 mg/dL (ref 65–99)
POTASSIUM: 4.8 mmol/L (ref 3.5–5.1)
SODIUM: 137 mmol/L (ref 135–145)
Total Bilirubin: 0.7 mg/dL (ref 0.3–1.2)
Total Protein: 8.5 g/dL — ABNORMAL HIGH (ref 6.5–8.1)

## 2016-01-04 LAB — CBC WITH DIFFERENTIAL/PLATELET
BASOS PCT: 0 %
Basophils Absolute: 0 10*3/uL (ref 0.0–0.1)
EOS ABS: 0 10*3/uL (ref 0.0–0.7)
Eosinophils Relative: 0 %
HCT: 31.7 % — ABNORMAL LOW (ref 36.0–46.0)
HEMOGLOBIN: 9.7 g/dL — AB (ref 12.0–15.0)
LYMPHS ABS: 1.8 10*3/uL (ref 0.7–4.0)
Lymphocytes Relative: 16 %
MCH: 32 pg (ref 26.0–34.0)
MCHC: 30.6 g/dL (ref 30.0–36.0)
MCV: 104.6 fL — ABNORMAL HIGH (ref 78.0–100.0)
Monocytes Absolute: 1.1 10*3/uL — ABNORMAL HIGH (ref 0.1–1.0)
Monocytes Relative: 9 %
NEUTROS PCT: 75 %
Neutro Abs: 8.6 10*3/uL — ABNORMAL HIGH (ref 1.7–7.7)
Platelets: 83 10*3/uL — ABNORMAL LOW (ref 150–400)
RBC: 3.03 MIL/uL — AB (ref 3.87–5.11)
RDW: 16.5 % — ABNORMAL HIGH (ref 11.5–15.5)
WBC: 11.5 10*3/uL — AB (ref 4.0–10.5)

## 2016-01-04 LAB — I-STAT CG4 LACTIC ACID, ED: Lactic Acid, Venous: 1.36 mmol/L (ref 0.5–1.9)

## 2016-01-04 MED ORDER — FLUTICASONE PROPIONATE 50 MCG/ACT NA SUSP
2.0000 | Freq: Every day | NASAL | Status: DC
Start: 2016-01-04 — End: 2016-01-11
  Administered 2016-01-04 – 2016-01-10 (×7): 2 via NASAL
  Filled 2016-01-04: qty 16

## 2016-01-04 MED ORDER — PANTOPRAZOLE SODIUM 40 MG PO TBEC
40.0000 mg | DELAYED_RELEASE_TABLET | Freq: Every day | ORAL | Status: DC
  Administered 2016-01-05 – 2016-01-11 (×7): 40 mg via ORAL
  Filled 2016-01-04 (×7): qty 1

## 2016-01-04 MED ORDER — LAMOTRIGINE 100 MG PO TABS
100.0000 mg | ORAL_TABLET | Freq: Two times a day (BID) | ORAL | Status: DC
  Administered 2016-01-04 – 2016-01-11 (×14): 100 mg via ORAL
  Filled 2016-01-04 (×13): qty 1

## 2016-01-04 MED ORDER — GABAPENTIN 100 MG PO CAPS
200.0000 mg | ORAL_CAPSULE | Freq: Two times a day (BID) | ORAL | Status: DC
  Administered 2016-01-04 – 2016-01-11 (×14): 200 mg via ORAL
  Filled 2016-01-04 (×14): qty 2

## 2016-01-04 MED ORDER — SODIUM CHLORIDE 0.9 % IV SOLN
INTRAVENOUS | Status: DC
  Administered 2016-01-04 – 2016-01-05 (×3): via INTRAVENOUS

## 2016-01-04 MED ORDER — ASPIRIN 81 MG PO CHEW
81.0000 mg | CHEWABLE_TABLET | Freq: Every day | ORAL | Status: DC
  Administered 2016-01-05 – 2016-01-11 (×7): 81 mg via ORAL
  Filled 2016-01-04 (×7): qty 1

## 2016-01-04 MED ORDER — CLOPIDOGREL BISULFATE 75 MG PO TABS
75.0000 mg | ORAL_TABLET | Freq: Every day | ORAL | Status: DC
  Administered 2016-01-05 – 2016-01-11 (×7): 75 mg via ORAL
  Filled 2016-01-04 (×7): qty 1

## 2016-01-04 MED ORDER — ALPRAZOLAM 0.5 MG PO TABS
0.5000 mg | ORAL_TABLET | Freq: Two times a day (BID) | ORAL | Status: DC | PRN
  Administered 2016-01-08 – 2016-01-10 (×4): 0.5 mg via ORAL
  Filled 2016-01-04 (×5): qty 1

## 2016-01-04 MED ORDER — VANCOMYCIN HCL IN DEXTROSE 750-5 MG/150ML-% IV SOLN
750.0000 mg | Freq: Two times a day (BID) | INTRAVENOUS | Status: AC
Start: 2016-01-05 — End: 2016-01-10
  Administered 2016-01-05 – 2016-01-10 (×12): 750 mg via INTRAVENOUS
  Filled 2016-01-04 (×12): qty 150

## 2016-01-04 MED ORDER — ACETYL L-CARNITINE 500 MG PO CAPS: 2000.0000 mg | ORAL_CAPSULE | Freq: Every day | ORAL | Status: DC

## 2016-01-04 MED ORDER — SODIUM CHLORIDE 0.9 % IV SOLN
1250.0000 mg | Freq: Once | INTRAVENOUS | Status: AC
  Administered 2016-01-04: 1250 mg via INTRAVENOUS
  Filled 2016-01-04: qty 1250

## 2016-01-04 MED ORDER — ATORVASTATIN CALCIUM 40 MG PO TABS
40.0000 mg | ORAL_TABLET | Freq: Every day | ORAL | Status: DC
  Administered 2016-01-04 – 2016-01-10 (×7): 40 mg via ORAL
  Filled 2016-01-04 (×7): qty 1

## 2016-01-04 MED ORDER — SULFAMETHOXAZOLE-TRIMETHOPRIM 800-160 MG PO TABS: 1.0000 | ORAL_TABLET | Freq: Two times a day (BID) | ORAL | 0 refills | Status: DC

## 2016-01-04 MED ORDER — SODIUM CHLORIDE 0.9 % IV BOLUS (SEPSIS)
1000.0000 mL | Freq: Once | INTRAVENOUS | Status: AC
  Administered 2016-01-04: 1000 mL via INTRAVENOUS

## 2016-01-04 MED ORDER — ONDANSETRON HCL 4 MG/2ML IJ SOLN
4.0000 mg | Freq: Four times a day (QID) | INTRAMUSCULAR | Status: DC | PRN
  Administered 2016-01-05 – 2016-01-10 (×9): 4 mg via INTRAVENOUS
  Filled 2016-01-04 (×9): qty 2

## 2016-01-04 MED ORDER — LEVOTHYROXINE SODIUM 100 MCG PO TABS
100.0000 ug | ORAL_TABLET | Freq: Every day | ORAL | Status: DC
  Administered 2016-01-05 – 2016-01-11 (×7): 100 ug via ORAL
  Filled 2016-01-04 (×7): qty 1

## 2016-01-04 MED ORDER — DIVALPROEX SODIUM 250 MG PO DR TAB
750.0000 mg | DELAYED_RELEASE_TABLET | Freq: Every day | ORAL | Status: DC
  Administered 2016-01-04 – 2016-01-10 (×7): 750 mg via ORAL
  Filled 2016-01-04 (×7): qty 1

## 2016-01-04 MED ORDER — ROPINIROLE HCL 1 MG PO TABS
1.5000 mg | ORAL_TABLET | Freq: Every day | ORAL | Status: DC
  Administered 2016-01-04 – 2016-01-10 (×7): 1.5 mg via ORAL
  Filled 2016-01-04 (×7): qty 1

## 2016-01-04 MED ORDER — DIVALPROEX SODIUM 500 MG PO DR TAB
500.0000 mg | DELAYED_RELEASE_TABLET | Freq: Every day | ORAL | Status: DC
  Administered 2016-01-05 – 2016-01-11 (×7): 500 mg via ORAL
  Filled 2016-01-04: qty 2
  Filled 2016-01-04: qty 1
  Filled 2016-01-04: qty 2
  Filled 2016-01-04: qty 1
  Filled 2016-01-04 (×3): qty 2
  Filled 2016-01-04 (×3): qty 1
  Filled 2016-01-04 (×2): qty 2
  Filled 2016-01-04 (×2): qty 1

## 2016-01-04 MED ORDER — LIDOCAINE HCL (PF) 1 % IJ SOLN
5.0000 mL | Freq: Once | INTRAMUSCULAR | Status: AC
  Administered 2016-01-04: 5 mL
  Filled 2016-01-04: qty 30

## 2016-01-04 MED ORDER — HYDROCODONE-ACETAMINOPHEN 5-325 MG PO TABS
1.0000 | ORAL_TABLET | Freq: Three times a day (TID) | ORAL | Status: DC | PRN
  Administered 2016-01-06 – 2016-01-10 (×3): 1 via ORAL
  Filled 2016-01-04 (×4): qty 1

## 2016-01-04 MED ORDER — ENOXAPARIN SODIUM 40 MG/0.4ML ~~LOC~~ SOLN
40.0000 mg | SUBCUTANEOUS | Status: DC
  Administered 2016-01-04 – 2016-01-05 (×2): 40 mg via SUBCUTANEOUS
  Filled 2016-01-04 (×2): qty 0.4

## 2016-01-04 NOTE — ED Provider Notes (Signed)
[facility] DEPT Provider Note   CSN: [id number] Arrival date & time: 01/04/16  1625     History   Chief Complaint Chief Complaint  Patient presents with  . Fever  . Cancer    HPI LANITRA DALLEY is a 60 y.o. female.  HPI Patient is on chronic chemotherapy for previous breast cancer. She began to feel bad around 3 days ago. Around 2 days ago began to have a redness and swelling on her chest. Apparent infection at the site of her previous cancer surgery. Seen at her primary care doctor yesterday and started on Bactrim with a Rocephin shot. Has worsened today. Has had fevers now. Fever up to 100.6 now. States she feels little bad all over. No chest pain besides at the site. No trouble breathing. No dysuria. No nausea or vomiting.   Past Medical History:  Diagnosis Date  . ADD (attention deficit disorder)   . Allergy    seasonal  . Anxiety   . Bipolar disorder ([place])   . Breast cancer ([place]) 1989   right breast  . Cancer ([place])    Omentum  . Coronary artery disease   . Depression   . Diabetes mellitus    pt denies DM noe meds  . Hematochezia   . History of echocardiogram    a. Limited Echo 9/17: EF 60-65%, no RWMA, Gr 1 DD, trivial AI, trivial MR, GLS -12% (likely underestimated), no pericardial effusion  . Hyperlipidemia   . Hypothyroidism   . Maintenance chemotherapy    Pt has chemo every 3 weeks (on Friday)  . NSTEMI (non-ST elevated myocardial infarction) ([place]) 10/26/2015  . Osteopenia 03/2009   t score -2.1 FRAX 4.6/0.4  . Premature menopause   . Sleep apnea    mild    Patient Active Problem List   Diagnosis Date Noted  . GERD (gastroesophageal reflux disease) 12/09/2015  . Presence of drug coated stent in LAD coronary artery 11/08/2015  . 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 Thrombocytopenia ([place]) 11/04/2015  . Coronary artery disease involving native heart without angina pectoris 11/03/2015  . NSTEMI (non-ST elevated myocardial infarction) ([place])    . Pain in the chest 10/25/2015  . Pancytopenia ([place]) 10/25/2015  . Elevated troponin 10/25/2015  . Leukocytosis 10/25/2015  . Chest wall pain 07/15/2015  . Genetic testing 07/04/2015  . Port catheter in place 06/16/2015  . Dizziness 03/29/2015  . Primary peritoneal carcinomatosis ([place]) 08/13/2013  . Malignant neoplasm of female breast ([place]) 09/19/2012  . Lithium use 09/17/2011  . Abnormal coordination 09/17/2011  . Premature menopause   . Depression   . Anxiety   . Bipolar disorder ([place])   . ADD (attention deficit disorder)   . Diabetes mellitus   . NONSPECIFIC ABNORMAL ELECTROCARDIOGRAM 11/04/2009  . ALLERGIC RHINITIS 09/01/2009  . DIAB W/O COMP TYPE II/UNS NOT STATED UNCNTRL 07/28/2008  . NONSPEC ELEVATION OF LEVELS OF TRANSAMINASE/LDH 07/28/2008  . MOTOR RESTLESSNESS 02/19/2008  . Internal hemorrhoids without mention of complication [id number]  . ESOPHAGEAL STRICTURE 09/15/2007  . Obstructive sleep apnea 07/21/2007  . Hyperlipidemia 07/07/2007  . Osteopenia 02/20/2007  . Hypothyroidism 10/01/2006  . BREAST CANCER, HX OF 08/12/2006    Past Surgical History:  Procedure Laterality Date  . BREAST SURGERY  1989   RIGHT LUMPECTOMY, RADIATION AND CHEMO  . CARDIAC CATHETERIZATION N/A 10/26/2015   Procedure: Left Heart Cath and Coronary Angiography;  Surgeon: Wellington Hampshire, MD;  Location: [facility];  Service: Cardiovascular;  Laterality: N/A;  .  CARDIAC CATHETERIZATION N/A 10/26/2015   Procedure: Coronary Stent Intervention;  Surgeon: Wellington Hampshire, MD;  Location: [facility];  Service: Cardiovascular;  Laterality: N/A;  . CARDIAC CATHETERIZATION N/A 11/08/2015   Procedure: Left Heart Cath and Coronary Angiography;  Surgeon: Jolaine Artist, MD;  Location: [facility];  Service: Cardiovascular;  Laterality: N/A;  . drug eluting stent  10/26/2015  . FOOT SURGERY  2013   BILATERAL   . HYSTEROSCOPY  2011   [place]  . PELVIC LAPAROSCOPY/ Hysteroscopy   1996    OB History    Gravida Para Term Preterm AB Living   0             SAB TAB Ectopic Multiple Live Births                   Home Medications    Prior to Admission medications   Medication Sig Start Date End Date Taking? Authorizing Provider  Acetylcarnitine HCl (ACETYL L-CARNITINE PO) Take 2,000 mg by mouth at bedtime.   Yes Historical Provider, MD  ALPRAZolam Duanne Moron) 0.5 MG tablet Take 0.5 mg by mouth 2 (two) times daily as needed for anxiety.   Yes Historical Provider, MD  aspirin 81 MG chewable tablet Chew 1 tablet (81 mg total) by mouth daily. 10/27/15  Yes Florencia Reasons, MD  atorvastatin (LIPITOR) 40 MG tablet Take 1 tablet (40 mg total) by mouth daily at 6 PM. 10/27/15  Yes Florencia Reasons, MD  carvedilol (COREG) 3.125 MG tablet Take 0.5 tablets (1.56 mg total) by mouth 2 (two) times daily with a meal. 11/12/15  Yes Erma Heritage, PA  clopidogrel (PLAVIX) 75 MG tablet Take 1 tablet (75 mg total) by mouth daily with breakfast. 10/27/15  Yes Florencia Reasons, MD  divalproex (DEPAKOTE) 250 MG DR tablet Take by mouth. Take 500mg s in the morning and 750mg s at night   Yes Historical Provider, MD  fluticasone (FLONASE) 50 MCG/ACT nasal spray INHALE 2 SPRAYS INTO EACH NOSTRIL EVERY NIGHT 08/25/15  Yes Historical Provider, MD  gabapentin (NEURONTIN) 100 MG capsule Take 1 capsule (100 mg total) by mouth 3 (three) times daily. Patient taking differently: Take 200 mg by mouth 2 (two) times daily.  01/02/16  Yes Wyatt Portela, MD  isosorbide mononitrate (IMDUR) 30 MG 24 hr tablet Take 1 tablet (30 mg total) by mouth daily. Patient taking differently: Take 15 mg by mouth daily.  11/24/15 02/22/16 Yes Fay Records, MD  lamoTRIgine (LAMICTAL) 100 MG tablet Take 100 mg by mouth 2 (two) times daily.   Yes Historical Provider, MD  levothyroxine (SYNTHROID, LEVOTHROID) 100 MCG tablet Take 100 mcg by mouth daily before breakfast.    Yes Historical Provider, MD  lidocaine-prilocaine (EMLA) cream Apply topically as needed.  Apply to port with every chemotherapy. 08/19/15  Yes Owens Shark, NP  lisinopril (PRINIVIL,ZESTRIL) 2.5 MG tablet Take 1 tablet (2.5 mg total) by mouth daily. 10/27/15  Yes Florencia Reasons, MD  loperamide (IMODIUM A-D) 2 MG tablet Take 2 mg by mouth as needed for diarrhea or loose stools.   Yes Historical Provider, MD  loratadine (CLARITIN) 10 MG tablet Take 10 mg by mouth daily as needed for allergies (with chemo).   Yes Historical Provider, MD  nitroGLYCERIN (NITROSTAT) 0.4 MG SL tablet Place 1 tablet (0.4 mg total) under the tongue every 5 (five) minutes as needed for chest pain. 11/24/15  Yes Fay Records, MD  ondansetron (ZOFRAN) 8 MG tablet TAKE  1 TABLET BY MOUTH EVERY 8 HOURS AS NEEDED FOR NAUSEA AND VOMITING 01/02/16  Yes Wyatt Portela, MD  pantoprazole (PROTONIX) 40 MG tablet Take 1 tablet (40 mg total) by mouth daily. 12/05/15  Yes Fay Records, MD  rOPINIRole (REQUIP) 0.5 MG tablet Take 1.5 mg by mouth at bedtime.    Yes Historical Provider, MD  sulfamethoxazole-trimethoprim (BACTRIM DS,SEPTRA DS) 800-160 MG tablet Take 1 tablet by mouth 2 (two) times daily. 01/04/16 01/11/16 Yes [name] [name], MD  HYDROcodone-acetaminophen (NORCO/VICODIN) 5-325 MG tablet Take 1 tablet by mouth every 8 (eight) hours as needed for moderate pain. 01/03/16 01/08/16  [name] [name], MD    Family History Family History  Problem Relation Age of Onset  . Breast cancer Maternal Aunt 29  . Diabetes Maternal Grandmother   . Heart disease Maternal Grandmother   . Heart disease Maternal Grandfather   . Hypertension Paternal Grandfather   . Heart Problems Paternal Grandfather   . Breast cancer Maternal Aunt     dx. early 44s; had negative GT approx 10 years ago  . Breast cancer Maternal Aunt     dx. 29s with recurrence  . Allergies Father   . Heart disease Mother   . Other Mother     hx of hysterectomy   . Liver cancer Cousin 75    +EtOH  . Cancer Cousin     paternal 1st cousin, once-removed dx. NOS cancer  (maybe ovarian) in late 30s-40s  . Cancer Cousin     female paternal 2nd cousin d. of NOS cancer in her 20s-early 67s  . Ovarian cancer Neg Hx   . Colon cancer Neg Hx     Social History Social History  Substance Use Topics  . Smoking status: Never Smoker  . Smokeless tobacco: Never Used  . Alcohol use 0.0 oz/week     Comment: very rarely - maybe 1 glass wine every 3 mos     Allergies   Doxil [doxorubicin hcl liposomal] and Pollen extract   Review of Systems Review of Systems  Constitutional: Positive for appetite change and fever.  HENT: Negative for congestion.   Eyes: Negative for visual disturbance.  Respiratory: Negative for shortness of breath.   Cardiovascular: Negative for chest pain.  Gastrointestinal: Negative for abdominal pain.  Genitourinary: Negative for dysuria and flank pain.  Skin: Positive for wound.  Neurological: Negative for seizures and weakness.  Hematological: Negative for adenopathy.  Psychiatric/Behavioral: Negative for confusion.     Physical Exam Updated Vital Signs BP 118/78 (BP Location: Left Arm)   Pulse 108   Temp 100.6 F (38.1 C) (Oral)   Resp 20   Ht 5\' 1"  (1.549 m)   Wt 161 lb (73 kg)   SpO2 95%   BMI 30.42 kg/m   Physical Exam  Constitutional: She appears well-developed and well-nourished.  Eyes: Pupils are equal, round, and reactive to light.  Neck: Neck supple.  Cardiovascular:  Mild tachycardia.  Pulmonary/Chest: She exhibits tenderness.  Tender indurated area over lower mid chest. There is some swelling and a central approximately 1.5 cm raised area with some purulence. Incision and drainage attempted but no real drainage. The erythema is at least 10 cm across. With some further streaking laterally to the left side. At site of scar from previous breast surgery. Has Port-A-Cath in right upper chest wall that the erythema is going towards but does not cover yet.  Neurological: She is alert.  Skin: Skin is warm. Capillary  refill  takes less than 2 seconds.  Psychiatric: She has a normal mood and affect.     ED Treatments / Results  Labs (all labs ordered are listed, but only abnormal results are displayed) Labs Reviewed  COMPREHENSIVE METABOLIC PANEL - Abnormal; Notable for the following:       Result Value   Total Protein 8.5 (*)    ALT 11 (*)    All other components within normal limits  CBC WITH DIFFERENTIAL/PLATELET - Abnormal; Notable for the following:    WBC 11.5 (*)    RBC 3.03 (*)    Hemoglobin 9.7 (*)    HCT 31.7 (*)    MCV 104.6 (*)    RDW 16.5 (*)    Platelets 83 (*)    Neutro Abs 8.6 (*)    Monocytes Absolute 1.1 (*)    All other components within normal limits  URINALYSIS, ROUTINE W REFLEX MICROSCOPIC (NOT AT [facility]) - Abnormal; Notable for the following:    Color, Urine AMBER (*)    Specific Gravity, Urine 1.034 (*)    Bilirubin Urine SMALL (*)    Leukocytes, UA SMALL (*)    All other components within normal limits  CULTURE, BLOOD (ROUTINE X 2)  CULTURE, BLOOD (ROUTINE X 2)  URINE CULTURE  URINE MICROSCOPIC-ADD ON  I-STAT CG4 LACTIC ACID, ED    EKG  EKG Interpretation None       Radiology Dg Chest 2 View  Result Date: 01/04/2016 CLINICAL DATA:  Fever x2 days, cellulitis of right chest EXAM: CHEST  2 VIEW COMPARISON:  CT chest dated 10/25/2015 FINDINGS: Mild patchy right lower lobe opacity, atelectasis versus pneumonia. Left lung is clear. No pleural effusion or pneumothorax. The heart is normal in size. Right chest power port terminates at the cavoatrial junction. IMPRESSION: Mild patchy right lower lobe opacity, atelectasis versus pneumonia. Electronically Signed   By: Julian Hy M.D.   On: 01/04/2016 18:23    Procedures Procedures (including critical care time)  Medications Ordered in ED Medications  vancomycin (VANCOCIN) 1,250 mg in sodium chloride 0.9 % 250 mL IVPB (1,250 mg Intravenous New Bag/Given 01/04/16 1811)    Followed by  vancomycin (VANCOCIN)  IVPB 750 mg/150 ml premix (not administered)  Acetyl L-Carnitine CAPS 2,000 mg (not administered)  ALPRAZolam (XANAX) tablet 0.5 mg (not administered)  aspirin chewable tablet 81 mg (not administered)  atorvastatin (LIPITOR) tablet 40 mg (not administered)  clopidogrel (PLAVIX) tablet 75 mg (not administered)  fluticasone (FLONASE) 50 MCG/ACT nasal spray 2 spray (not administered)  gabapentin (NEURONTIN) capsule 200 mg (not administered)  HYDROcodone-acetaminophen (NORCO/VICODIN) 5-325 MG per tablet 1 tablet (not administered)  rOPINIRole (REQUIP) tablet 1.5 mg (not administered)  pantoprazole (PROTONIX) EC tablet 40 mg (not administered)  levothyroxine (SYNTHROID, LEVOTHROID) tablet 100 mcg (not administered)  lamoTRIgine (LAMICTAL) tablet 100 mg (not administered)  lidocaine (PF) (XYLOCAINE) 1 % injection 5 mL (5 mLs Infiltration Given 01/04/16 1742)  sodium chloride 0.9 % bolus 1,000 mL (0 mLs Intravenous Stopped 01/04/16 1917)     Initial Impression / Assessment and Plan / ED Course  I have reviewed the triage vital signs and the nursing notes.  Pertinent labs & imaging results that were available during my care of the patient were reviewed by me and considered in my medical decision making (see chart for details).  Clinical Course     Patient with cellulitis of chest. Attempted incision and drainage with no real drainage of it. Has had Bactrim and Rocephin yesterday through PCP.  Now is having fevers increasing redness. Will admit to internal medicine.  INCISION AND DRAINAGE Performed by: Mackie Pai. Consent: Verbal consent obtained. Risks and benefits: risks, benefits and alternatives were discussed Type: abscess  Body area: chest  Anesthesia: local infiltration  Incision was made with a scalpel.  Local anesthetic: lidocaine 1%   Anesthetic total: 2 ml Drainage: purulent  Drainage amount minimal  Patient tolerance: Patient tolerated the procedure well with  no immediate complications. Minimal drainage on incision. No packing or further probing done.     Final Clinical Impressions(s) / ED Diagnoses   Final diagnoses:  Cellulitis of chest wall    New Prescriptions New Prescriptions   No medications on file     Davonna Belling, MD 01/04/16 [phone]

## 2016-01-04 NOTE — Telephone Encounter (Signed)
Pt has checked in to [facility] ED. Nothing further needed at this time.

## 2016-01-04 NOTE — ED Triage Notes (Signed)
She reports fever for a couple of days. Saw pcp yesterday who dx cellulitis of right chest (s/p mastectomy site); gave "antibiotic shot" and told her to go to hospital if it got worse.

## 2016-01-04 NOTE — Progress Notes (Signed)
Pharmacy Antibiotic Note  [name] is a 60 y.o. female admitted on 01/04/2016 with cellulitis at R mastectomy site. Given Rocephin and Bactrim x 1 yesterday, but cellulitis worse today. Pharmacy has been consulted for vancomycin dosing.  Plan:  Vancomycin 1250 mg IV now, then 750 mg IV q12 hr; goal trough 10-15 mcg/mL  Measure vancomycin trough levels at steady state as indicated   Height: 5\' 1"  (154.9 cm) Weight: 161 lb (73 kg) IBW/kg (Calculated) : 47.8  Temp (24hrs), Avg:100.6 F (38.1 C), Min:100.6 F (38.1 C), Max:100.6 F (38.1 C)   Recent Labs Lab 01/04/16 1720 01/04/16 1750  WBC 11.5*  --   CREATININE 0.74  --   LATICACIDVEN  --  1.36    Estimated Creatinine Clearance: 70.1 mL/min (by C-G formula based on SCr of 0.74 mg/dL).    Allergies  Allergen Reactions  . Doxil [Doxorubicin Hcl Liposomal] Anaphylaxis    1st Doxil.   . Pollen Extract Other (See Comments)    Pollen and grass causes a lot sneezing    Antimicrobials this admission: Vancomycin 11/15 >>   Dose adjustments this admission: ---  Microbiology results: 11/15 BCx: sent 11/15 UCx: sent   Thank you for allowing pharmacy to be a part of this patient's care.  Reuel Boom, PharmD, BCPS Pager: [phone] 01/04/2016, 8:15 PM

## 2016-01-04 NOTE — Patient Outreach (Signed)
[name] is a 60 y.o. female referred to pharmacy for medication management for medication review and related to "questions/concerns regarding her chemo[therapy] treatments possibly interfering with some her meds and making her feel sicker." Called and spoke with patient. HIPAA identifiers verified and verbal consent received.  [name] reports that she has no questions regarding her medications or interactions at this time. Patient reports that she is not feeling well now and asks that I call her back. Let patient know that I will give her another call on Friday.  PLAN:  Will outreach to patient again on 01/06/16.  Harlow Asa, PharmD Clinical Pharmacist [facility] [phone]

## 2016-01-04 NOTE — ED Notes (Signed)
MD provided with 1/4 in packing.

## 2016-01-04 NOTE — Telephone Encounter (Signed)
See below

## 2016-01-04 NOTE — Telephone Encounter (Signed)
° ° °  [facility] yesterday and did not received the following RX   Will you please resend   sulfamethoxazole-trimethoprim (BACTRIM DS,SEPTRA DS) 800-160 MG tablet    [facility] [address]

## 2016-01-04 NOTE — Telephone Encounter (Signed)
Patient Name: NEZIAH SCHEIBEL DOB: 12-20-1955 Initial Comment Caller states she has cellulitis and wondering if it is contagious. Nurse Assessment Nurse: Ronnald Ramp, RN, Miranda Date/Time (Eastern Time): 01/04/2016 10:39:14 AM Confirm and document reason for call. If symptomatic, describe symptoms. You must click the next button to save text entered. ---Caller states she was diagnosed with cellulitis yesterday and has questions about if it is contagious or not. She has not started antibiotics yet. The area has started draining. Has the patient traveled out of the country within the last 30 days? ---Not Applicable Does the patient have any new or worsening symptoms? ---Yes Will a triage be completed? ---Yes Related visit to physician within the last 2 weeks? ---Yes Does the PT have any chronic conditions? (i.e. diabetes, asthma, etc.) ---Unknown Is this a behavioral health or substance abuse call? ---No Guidelines Guideline Title Affirmed Question Affirmed Notes Cellulitis on Antibiotic Follow-up Call [1] Taking antibiotic < 72 hours (3 days) AND [2] cellulitis symptoms are SAME (not getting better) AND [3] no fever (all triage questions negative) Final Disposition User Home Care Ronnald Ramp, RN, Miranda Comments Office said to triage. She did notice a pale pink line running from the area, but she is not sure if that is new or part of the scar. Told caller to monitor this closely and if it becomes bright red or get longer to call back. Disagree/Comply: Comply

## 2016-01-04 NOTE — Telephone Encounter (Signed)
Rx resent.

## 2016-01-04 NOTE — ED Notes (Signed)
Bed: WA07 Expected date:  Expected time:  Means of arrival:  Comments: For Weekly

## 2016-01-04 NOTE — Telephone Encounter (Signed)
[facility]  Patient Name: [name]  DOB: [birth year]    Initial Comment Caller states she is suppose to call if she has a Fever and she does of 100.4. She is having lines have streched further from the site.   Nurse Assessment  Nurse: Harlow Mares, RN, Suanne Marker Date/Time Eilene Ghazi Time): 01/04/2016 3:43:30 PM  Confirm and document reason for call. If symptomatic, describe symptoms. You must click the next button to save text entered. ---Caller states she is suppose to call if she has a Fever and she does of 100.4. She is having lines have stretched further from the site. Lumpectomy (20 yrs ago) and the lines are left of the nipple, redness but not inflammed. She currently has cellulitis of the chest. Began abx yesterday and now has a fever, 100.4  Has the patient traveled out of the country within the last 30 days? ---Not Applicable  Does the patient have any new or worsening symptoms? ---Yes  Will a triage be completed? ---Yes  Related visit to physician within the last 2 weeks? ---Yes  Does the PT have any chronic conditions? (i.e. diabetes, asthma, etc.) ---Yes  List chronic conditions. ---breast cancer (chemo q 3 weeks); hx MI; HTN  Is this a behavioral health or substance abuse call? ---No     Guidelines    Guideline Title Affirmed Question Affirmed Notes  Cellulitis on Antibiotic Follow-up Call Black (necrotic) or blisters develop in cellulitis    Final Disposition User   Go to ED Now (or PCP triage) Harlow Mares, RN, [place]    Referrals  [facility] - ED   Disagree/Comply: Comply

## 2016-01-04 NOTE — H&P (Signed)
History and Physical    [name] [id number] DOB: May 25, 1955 DOA: 01/04/2016   PCP: Betty [name], MD Chief Complaint:  Chief Complaint  Patient presents with  . Fever  . Cancer    HPI: [name] is a 60 y.o. 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  Patient presents to the ED with c/o not feeling well onset 3 days ago.  2 days ago she began to have redness, swelling of R chest at site of prior lumpectomy (in 1989).  Was seen at PCPs office, given injection of rocephin and put on bactrim.  Today symptoms worsened with onset of fevers at home (100.6 in ED).  ED Course: EDP attempted I+D of fluctuant area that had some purulent drainage, but didn't get much out he says (see his note).  Review of Systems: As per HPI otherwise 10 point review of systems negative.    Past Medical History:  Diagnosis Date  . ADD (attention deficit disorder)   . Allergy    seasonal  . Anxiety   . Bipolar disorder ([place])   . Breast cancer ([place]) 1989   right breast  . Cancer ([place])    Omentum  . Coronary artery disease   . Depression   . Diabetes mellitus    pt denies DM noe meds  . Hematochezia   . History of echocardiogram    a. Limited Echo 9/17: EF 60-65%, no RWMA, Gr 1 DD, trivial AI, trivial MR, GLS -12% (likely underestimated), no pericardial effusion  . Hyperlipidemia   . Hypothyroidism   . Maintenance chemotherapy    Pt has chemo every 3 weeks (on Friday)  . NSTEMI (non-ST elevated myocardial infarction) ([place]) 10/26/2015  . Osteopenia 03/2009   t score -2.1 FRAX 4.6/0.4  . Premature menopause   . Sleep apnea    mild    Past Surgical History:    Procedure Laterality Date  . BREAST SURGERY  1989   RIGHT LUMPECTOMY, RADIATION AND CHEMO  . CARDIAC CATHETERIZATION N/A 10/26/2015   Procedure: Left Heart Cath and Coronary Angiography;  Surgeon: Wellington Hampshire, MD;  Location: [facility];  Service: Cardiovascular;  Laterality: N/A;  . CARDIAC CATHETERIZATION N/A 10/26/2015   Procedure: Coronary Stent Intervention;  Surgeon: Wellington Hampshire, MD;  Location: [facility];  Service: Cardiovascular;  Laterality: N/A;  . CARDIAC CATHETERIZATION N/A 11/08/2015   Procedure: Left Heart Cath and Coronary Angiography;  Surgeon: Jolaine Artist, MD;  Location: [facility];  Service: Cardiovascular;  Laterality: N/A;  . drug eluting stent  10/26/2015  . FOOT SURGERY  2013   BILATERAL   . HYSTEROSCOPY  2011   Polyp  . PELVIC LAPAROSCOPY/ Hysteroscopy  1996     reports that she has never smoked. She has never used smokeless tobacco. She reports that she drinks alcohol. She reports that she does not use drugs.  Allergies  Allergen Reactions  . Doxil [Doxorubicin Hcl Liposomal] Anaphylaxis    1st Doxil.   . Pollen Extract Other (See Comments)    Pollen and grass causes a lot sneezing    Family History  Problem Relation Age of Onset  . Breast cancer Maternal  Aunt 29  . Diabetes Maternal Grandmother   . Heart disease Maternal Grandmother   . Heart disease Maternal Grandfather   . Hypertension Paternal Grandfather   . Heart Problems Paternal Grandfather   . Breast cancer Maternal Aunt     dx. early 31s; had negative GT approx 10 years ago  . Breast cancer Maternal Aunt     dx. 39s with recurrence  . Allergies Father   . Heart disease Mother   . Other Mother     hx of hysterectomy   . Liver cancer Cousin 63    +EtOH  . Cancer Cousin     paternal 1st cousin, once-removed dx. NOS cancer (maybe ovarian) in late 30s-40s  . Cancer Cousin     female paternal 2nd cousin d. of NOS cancer in her 20s-early 13s  . Ovarian  cancer Neg Hx   . Colon cancer Neg Hx       Prior to Admission medications   Medication Sig Start Date End Date Taking? Authorizing Provider  Acetylcarnitine HCl (ACETYL L-CARNITINE PO) Take 2,000 mg by mouth at bedtime.   Yes Historical Provider, MD  ALPRAZolam Duanne Moron) 0.5 MG tablet Take 0.5 mg by mouth 2 (two) times daily as needed for anxiety.   Yes Historical Provider, MD  aspirin 81 MG chewable tablet Chew 1 tablet (81 mg total) by mouth daily. 10/27/15  Yes Florencia Reasons, MD  atorvastatin (LIPITOR) 40 MG tablet Take 1 tablet (40 mg total) by mouth daily at 6 PM. 10/27/15  Yes Florencia Reasons, MD  carvedilol (COREG) 3.125 MG tablet Take 0.5 tablets (1.56 mg total) by mouth 2 (two) times daily with a meal. 11/12/15  Yes Erma Heritage, PA  clopidogrel (PLAVIX) 75 MG tablet Take 1 tablet (75 mg total) by mouth daily with breakfast. 10/27/15  Yes Florencia Reasons, MD  divalproex (DEPAKOTE) 250 MG DR tablet Take by mouth. Take 532ms in the morning and 7591m at night   Yes Historical Provider, MD  fluticasone (FLONASE) 50 MCG/ACT nasal spray INHALE 2 SPRAYS INTO EACH NOSTRIL EVERY NIGHT 08/25/15  Yes Historical Provider, MD  gabapentin (NEURONTIN) 100 MG capsule Take 1 capsule (100 mg total) by mouth 3 (three) times daily. Patient taking differently: Take 200 mg by mouth 2 (two) times daily.  01/02/16  Yes FiWyatt PortelaMD  isosorbide mononitrate (IMDUR) 30 MG 24 hr tablet Take 1 tablet (30 mg total) by mouth daily. Patient taking differently: Take 15 mg by mouth daily.  11/24/15 02/22/16 Yes PaFay RecordsMD  lamoTRIgine (LAMICTAL) 100 MG tablet Take 100 mg by mouth 2 (two) times daily.   Yes Historical Provider, MD  levothyroxine (SYNTHROID, LEVOTHROID) 100 MCG tablet Take 100 mcg by mouth daily before breakfast.    Yes Historical Provider, MD  lidocaine-prilocaine (EMLA) cream Apply topically as needed. Apply to port with every chemotherapy. 08/19/15  Yes LiOwens SharkNP  lisinopril (PRINIVIL,ZESTRIL) 2.5 MG tablet  Take 1 tablet (2.5 mg total) by mouth daily. 10/27/15  Yes FaFlorencia ReasonsMD  loperamide (IMODIUM A-D) 2 MG tablet Take 2 mg by mouth as needed for diarrhea or loose stools.   Yes Historical Provider, MD  loratadine (CLARITIN) 10 MG tablet Take 10 mg by mouth daily as needed for allergies (with chemo).   Yes Historical Provider, MD  nitroGLYCERIN (NITROSTAT) 0.4 MG SL tablet Place 1 tablet (0.4 mg total) under the tongue every 5 (five) minutes as needed for chest pain. 11/24/15  Yes PaNevin Bloodgood  Alcus Dad, MD  ondansetron (ZOFRAN) 8 MG tablet TAKE 1 TABLET BY MOUTH EVERY 8 HOURS AS NEEDED FOR NAUSEA AND VOMITING 01/02/16  Yes Wyatt Portela, MD  pantoprazole (PROTONIX) 40 MG tablet Take 1 tablet (40 mg total) by mouth daily. 12/05/15  Yes Fay Records, MD  rOPINIRole (REQUIP) 0.5 MG tablet Take 1.5 mg by mouth at bedtime.    Yes Historical Provider, MD  sulfamethoxazole-trimethoprim (BACTRIM DS,SEPTRA DS) 800-160 MG tablet Take 1 tablet by mouth 2 (two) times daily. 01/04/16 01/11/16 Yes [name] [name], MD  HYDROcodone-acetaminophen (NORCO/VICODIN) 5-325 MG tablet Take 1 tablet by mouth every 8 (eight) hours as needed for moderate pain. 01/03/16 01/08/16  [name] [name], MD    Physical Exam: Vitals:   01/04/16 1652 01/04/16 1849  BP: 118/78   Pulse: 108   Resp: 20   Temp: 100.6 F (38.1 C)   TempSrc: Oral   SpO2: 95%   Weight:  73 kg (161 lb)  Height:  5' 1" (1.549 m)      Constitutional: NAD, calm, comfortable Eyes: PERRL, lids and conjunctivae normal ENMT: Mucous membranes are moist. Posterior pharynx clear of any exudate or lesions.Normal dentition.  Neck: normal, supple, no masses, no thyromegaly Respiratory: clear to auscultation bilaterally, no wheezing, no crackles. Normal respiratory effort. No accessory muscle use.  Cardiovascular: Regular rate and rhythm, no murmurs / rubs / gallops. No extremity edema. 2+ pedal pulses. No carotid bruits.  Abdomen: no tenderness, no masses palpated. No  hepatosplenomegaly. Bowel sounds positive.  Musculoskeletal: no clubbing / cyanosis. No joint deformity upper and lower extremities. Good ROM, no contractures. Normal muscle tone.  Skin: Tender indurated area over lower mid chest. There is some swelling and a central approximately 1.5 cm raised area with some purulence.  The erythema is at least 10 cm across. With some further streaking laterally to the left side. At site of scar from previous breast surgery. Has Port-A-Cath in right upper chest wall that the erythema is going towards but does not cover yet. Neurologic: CN 2-12 grossly intact. Sensation intact, DTR normal. Strength 5/5 in all 4.  Psychiatric: Normal judgment and insight. Alert and oriented x 3. Normal mood.    Labs on Admission: I have personally reviewed following labs and imaging studies  CBC:  Recent Labs Lab 01/04/16 1720  WBC 11.5*  NEUTROABS 8.6*  HGB 9.7*  HCT 31.7*  MCV 104.6*  PLT 83*   Basic Metabolic Panel:  Recent Labs Lab 01/04/16 1720  NA 137  K 4.8  CL 102  CO2 28  GLUCOSE 93  BUN 12  CREATININE 0.74  CALCIUM 9.6   GFR: Estimated Creatinine Clearance: 70.1 mL/min (by C-G formula based on SCr of 0.74 mg/dL). Liver Function Tests:  Recent Labs Lab 01/04/16 1720  AST 15  ALT 11*  ALKPHOS 98  BILITOT 0.7  PROT 8.5*  ALBUMIN 4.4   No results for input(s): LIPASE, AMYLASE in the last 168 hours. No results for input(s): AMMONIA in the last 168 hours. Coagulation Profile: No results for input(s): INR, PROTIME in the last 168 hours. Cardiac Enzymes: No results for input(s): CKTOTAL, CKMB, CKMBINDEX, TROPONINI in the last 168 hours. BNP (last 3 results) No results for input(s): PROBNP in the last 8760 hours. HbA1C: No results for input(s): HGBA1C in the last 72 hours. CBG: No results for input(s): GLUCAP in the last 168 hours. Lipid Profile: No results for input(s): CHOL, HDL, LDLCALC, TRIG, CHOLHDL, LDLDIRECT in the last  72  hours. Thyroid Function Tests: No results for input(s): TSH, T4TOTAL, FREET4, T3FREE, THYROIDAB in the last 72 hours. Anemia Panel: No results for input(s): VITAMINB12, FOLATE, FERRITIN, TIBC, IRON, RETICCTPCT in the last 72 hours. Urine analysis:    Component Value Date/Time   COLORURINE AMBER (A) 01/04/2016 1720   APPEARANCEUR CLEAR 01/04/2016 1720   LABSPEC 1.034 (H) 01/04/2016 1720   LABSPEC 1.010 07/15/2015 0944   PHURINE 6.5 01/04/2016 1720   GLUCOSEU NEGATIVE 01/04/2016 1720   GLUCOSEU Negative 07/15/2015 0944   HGBUR NEGATIVE 01/04/2016 1720   BILIRUBINUR SMALL (A) 01/04/2016 1720   BILIRUBINUR Negative 07/15/2015 0944   KETONESUR NEGATIVE 01/04/2016 1720   PROTEINUR NEGATIVE 01/04/2016 1720   UROBILINOGEN 0.2 07/15/2015 0944   NITRITE NEGATIVE 01/04/2016 1720   LEUKOCYTESUR SMALL (A) 01/04/2016 1720   LEUKOCYTESUR Negative 07/15/2015 0944   Sepsis Labs: _0 (procalcitonin:4,lacticidven:4) )No results found for this or any previous visit (from the past 240 hour(s)).   Radiological Exams on Admission: Dg Chest 2 View  Result Date: 01/04/2016 CLINICAL DATA:  Fever x2 days, cellulitis of right chest EXAM: CHEST  2 VIEW COMPARISON:  CT chest dated 10/25/2015 FINDINGS: Mild patchy right lower lobe opacity, atelectasis versus pneumonia. Left lung is clear. No pleural effusion or pneumothorax. The heart is normal in size. Right chest power port terminates at the cavoatrial junction. IMPRESSION: Mild patchy right lower lobe opacity, atelectasis versus pneumonia. Electronically Signed   By: Julian Hy M.D.   On: 01/04/2016 18:23    EKG: Independently reviewed.  Assessment/Plan Principal Problem:   Cellulitis of right breast Active Problems:   BREAST CANCER, HX OF   Primary peritoneal carcinomatosis ([place])   Coronary artery disease involving native heart without angina pectoris    1. Cellulitis of right breast with mild sepsis - 1. IVF, will hydrate gently  for now, 1L bolus for now, if BP drops then convert to sepsis protocol but currently 324 systolic 2. Will also hold home BP meds due to borderline low BPs 3. Tele monitor for tachycardia, currently NSR at 93 4. Vanc per pharm 5. May need to see if Dr. Alen Blew wants any sort of biopsy on the R breast site (infection vs mass) 2. 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 Next chemo due after Thanksgiving 2. Put consult into epic as per routine 3. CAD s/p DES in Sept - 1. Continue ASA / plavix   DVT prophylaxis: Lovenox Code Status: Full Family Communication: Husband at bedside Consults called: Put consult to oncology into Epic as per routine when admitting a chemo patient. Admission status: Admit to inpatient   Etta Quill DO [facility] Pager [phone] from 7PM-7AM  If 7AM-7PM, please contact the day physician for the patient [email or web address] Password [email or web address]  01/04/2016, 8:06 PM

## 2016-01-04 NOTE — Progress Notes (Signed)
PHARMACIST - PHYSICIAN ORDER COMMUNICATION  CONCERNING: P&T Medication Policy on Herbal Medications  DESCRIPTION:  This patient's order for:  Acetyl L - carnitine  has been noted.  This product(s) is classified as an "herbal" or natural product. Due to a lack of definitive safety studies or FDA approval, nonstandard manufacturing practices, plus the potential risk of unknown drug-drug interactions while on inpatient medications, the Pharmacy and Therapeutics Committee does not permit the use of "herbal" or natural products of this type within [facility].   ACTION TAKEN: The pharmacy department is unable to verify this order at this time and your patient has been informed of this safety policy. Please reevaluate patient's clinical condition at discharge and address if the herbal or natural product(s) should be resumed at that time.  Peggyann Juba, PharmD, BCPS 01/04/2016 9:24 PM

## 2016-01-04 NOTE — ED Notes (Signed)
Urine sent to lab 

## 2016-01-05 ENCOUNTER — Telehealth (HOSPITAL_COMMUNITY): Payer: Self-pay | Admitting: *Deleted

## 2016-01-05 ENCOUNTER — Encounter (HOSPITAL_COMMUNITY): Payer: Self-pay

## 2016-01-05 ENCOUNTER — Encounter (HOSPITAL_COMMUNITY): Payer: Self-pay | Admitting: *Deleted

## 2016-01-05 DIAGNOSIS — C482 Malignant neoplasm of peritoneum, unspecified: Secondary | ICD-10-CM

## 2016-01-05 DIAGNOSIS — N61 Mastitis without abscess: Secondary | ICD-10-CM | POA: Diagnosis not present

## 2016-01-05 DIAGNOSIS — L03313 Cellulitis of chest wall: Secondary | ICD-10-CM | POA: Diagnosis not present

## 2016-01-05 DIAGNOSIS — D696 Thrombocytopenia, unspecified: Secondary | ICD-10-CM | POA: Diagnosis not present

## 2016-01-05 DIAGNOSIS — C50912 Malignant neoplasm of unspecified site of left female breast: Secondary | ICD-10-CM

## 2016-01-05 LAB — CBC
HCT: 26.2 % — ABNORMAL LOW (ref 36.0–46.0)
Hemoglobin: 8 g/dL — ABNORMAL LOW (ref 12.0–15.0)
MCH: 31.3 pg (ref 26.0–34.0)
MCHC: 30.5 g/dL (ref 30.0–36.0)
MCV: 102.3 fL — AB (ref 78.0–100.0)
PLATELETS: 58 10*3/uL — AB (ref 150–400)
RBC: 2.56 MIL/uL — AB (ref 3.87–5.11)
RDW: 16.2 % — ABNORMAL HIGH (ref 11.5–15.5)
WBC: 8.4 10*3/uL (ref 4.0–10.5)

## 2016-01-05 LAB — URINE CULTURE: CULTURE: NO GROWTH

## 2016-01-05 LAB — BASIC METABOLIC PANEL
ANION GAP: 6 (ref 5–15)
BUN: 10 mg/dL (ref 6–20)
CO2: 26 mmol/L (ref 22–32)
Calcium: 8.9 mg/dL (ref 8.9–10.3)
Chloride: 107 mmol/L (ref 101–111)
Creatinine, Ser: 0.66 mg/dL (ref 0.44–1.00)
GFR calc Af Amer: >60 mL/min (ref >60)
GLUCOSE: 93 mg/dL (ref 65–99)
POTASSIUM: 4.8 mmol/L (ref 3.5–5.1)
Sodium: 139 mmol/L (ref 135–145)

## 2016-01-05 MED ORDER — ACETAMINOPHEN 325 MG PO TABS
650.0000 mg | ORAL_TABLET | ORAL | Status: DC | PRN
  Administered 2016-01-05 – 2016-01-08 (×6): 650 mg via ORAL
  Filled 2016-01-05 (×6): qty 2

## 2016-01-05 NOTE — Consult Note (Signed)
   [facility] CM Inpatient Consult   01/05/2016  [place] 09/12/1955 [id number]    Patient screened for [facility] services. Went to bedside to offer and explain [facility] Care Management program with Mrs. Klingel. She pleasantly declines stating "I had that in the past and it was just one more thing to do, it was too many calls and they were too pushy". After further discussion, Mrs. Weigert states it was [facility]. Nonetheless, Mrs. Papworth denies needing any follow up at this time. Provided [facility] Care Management brochure with contact information and 24-hr nurse line magnet. Thanked Mrs. [facility] for honesty. Made her aware that she can contact [facility] in the future should she change her mind. Will make inpatient RNCM aware that patient declined [facility] services.   Marthenia Rolling, MSN-Ed, RN,BSN [facility] Liaison [phone]

## 2016-01-05 NOTE — Progress Notes (Signed)
Discharge Summary  Patient Details  Name: [name] MRN: [id number] Date of Birth: 22-Aug-1955 Referring Provider:   Flowsheet Row CARDIAC REHAB PHASE II ORIENTATION from 12/08/2015 in [facility]  Referring Provider  Dorris Carnes MD       Number of Visits: 4  Reason for Discharge:  Early Exit:  [name] is currently in the hospital at [facility] will discharge and bring her back to the program when she is ready.  Smoking History:  History  Smoking Status  . Never Smoker  Smokeless Tobacco  . Never Used    Diagnosis:  No diagnosis found.  ADL UCSD:   Initial Exercise Prescription:     Initial Exercise Prescription - 12/08/15 1100      Date of Initial Exercise RX and Referring Provider   Date 12/08/15   Referring Provider Dorris Carnes MD     Recumbant Bike   Level 1   Watts 22.7   Minutes 10   METs 2.9     NuStep   Level 1   Minutes 10   METs 1.7     Track   Laps 8   Minutes 10   METs 2.39     Prescription Details   Frequency (times per week) 3   Duration Progress to 45 minutes of aerobic exercise without signs/symptoms of physical distress     Intensity   THRR 40-80% of Max Heartrate 65-130   Ratings of Perceived Exertion 11-13   Perceived Dyspnea 0-4     Progression   Progression Continue to progress workloads to maintain intensity without signs/symptoms of physical distress.     Resistance Training   Training Prescription No      Discharge Exercise Prescription (Final Exercise Prescription Changes):     Exercise Prescription Changes - 01/06/16 0800      Response to Exercise   Blood Pressure (Admit) 118/80   Blood Pressure (Exercise) 114/60   Blood Pressure (Exit) 104/68   Heart Rate (Admit) 85 bpm   Heart Rate (Exercise) 90 bpm   Heart Rate (Exit) 81 bpm   Rating of Perceived Exertion (Exercise) 12   Symptoms none   Duration Progress to 30 minutes of continuous aerobic without signs/symptoms of  physical distress   Intensity THRR unchanged     Progression   Average METs 2.2     Resistance Training   Training Prescription No     Interval Training   Interval Training No     Recumbant Bike   Level 1   Watts --   Minutes 10   METs --     NuStep   Level 1   Minutes 10   METs 1.9     Track   Laps 8   Minutes 10   METs 2.39      Functional Capacity:     6 Minute Walk    Row Name 12/08/15 1142         6 Minute Walk   Phase Initial     Distance 1169 feet     Walk Time 6 minutes     MPH 2.21     METS 2.96     RPE 13     Perceived Dyspnea  0     VO2 Peak 10.34     Symptoms No     Resting HR 79 bpm     Resting BP 104/60     Max Ex. HR 98 bpm  Max Ex. BP 123/76     2 Minute Post BP 96/61        Psychological, QOL, Others - Outcomes: PHQ 2/9: Depression screen [facility] 2/9 12/12/2015 12/07/2015  Decreased Interest 1 0  Down, Depressed, Hopeless 1 1  PHQ - 2 Score 2 1  Altered sleeping 0 -  Tired, decreased energy 1 -  Change in appetite 0 -  Feeling bad or failure about yourself  0 -  Trouble concentrating 0 -  Moving slowly or fidgety/restless 1 -  Suicidal thoughts 0 -  PHQ-9 Score 4 -  Difficult doing work/chores Somewhat difficult -  Some recent data might be hidden    Quality of Life:     Quality of Life - 12/13/15 1208      Quality of Life Scores   Health/Function Pre --  [name] is dissatisfied with her health due to her CAD and Cancer   Socioeconomic Pre --  Worried about finances due to recent hospitalization and medical bills.   Psych/Spiritual Pre --  Un happy does not have a lot of faith in spirituality due to current state of health   GLOBAL Pre --  [name] has been depressed. Denies feeling suicidal . [name] sees a therapist  at [facility]. See's [name]      Personal Goals: Goals established at orientation with interventions provided to work toward goal.     Personal Goals and Risk Factors at  Admission - 12/08/15 1536      Core Components/Risk Factors/Patient Goals on Admission   Intervention Provide an individualized exercise program and small group education classes.         Personal Goals Discharge:     Goals and Risk Factor Review    Row Name 12/08/15 1516 01/06/16 0858           Core Components/Risk Factors/Patient Goals Review   Personal Goals Review Increase Strength and Stamina;Other;Weight Management/Obesity Other      Review Pt wants to increase quality of breathing w/ singing and increase stamina Participant discharged for medical reaons.      Expected Outcomes Improve overall functional capacity  Participant may consider re-enrollinging in cardiac rehab once medical issues are resolved.         Nutrition & Weight - Outcomes:     Pre Biometrics - 12/08/15 1209      Pre Biometrics   Waist Circumference 41.5 inches   Hip Circumference 42 inches   Waist to Hip Ratio 0.99 %   Triceps Skinfold 26 mm   % Body Fat 42.6 %   Grip Strength 21 kg   Flexibility 8.25 in   Single Leg Stand 1.3 seconds         Post Biometrics - 12/21/15 1501       Post  Biometrics   Height 5\' 1"  (1.549 m)   Weight 166 lb 10.7 oz (75.6 kg)   BMI (Calculated) 31.6      Nutrition:     Nutrition Therapy & Goals - 12/12/15 1009      Nutrition Therapy   Diet Carb Modified, Therapeutic Lifestyle Changes     Personal Nutrition Goals   Personal Goal #1 1-2 lb wt loss/week to a wt loss goal of 6-24 lb at graduation from [place], educate and counsel regarding individualized specific dietary modifications aiming towards targeted core components such as weight, hypertension, lipid management, diabetes, heart failure and other  comorbidities.   Expected Outcomes Short Term Goal: Understand basic principles of dietary content, such as calories, fat, sodium, cholesterol and nutrients.;Long Term Goal: Adherence to prescribed  nutrition plan.      Nutrition Discharge:     Nutrition Assessments - 01/09/16 1401      MEDFICTS Scores   Pre Score 79      Education Questionnaire Score:     Knowledge Questionnaire Score - 12/12/15 1706      Knowledge Questionnaire Score   Pre Score 22/24      Irish's last day of exercise at cardiac rehab was on 12/21/2015. [name] is currently in the hospital at [facility]. I talked with [name] on the phone today. [name] said she would like to complete the program at the first of next year if she is able. Will discharge from cardiac rehab at this time.Harrell Gave RN BSN 01/05/2016

## 2016-01-05 NOTE — Progress Notes (Signed)
IP PROGRESS NOTE  Subjective:   60 year old woman known to me with history of breast cancer as well as peritoneal carcinomatosis. She was hospitalized on 01/04/2016 for cellulitis of the chest wall. She noted there is some erythema around her previous lumpectomy site and developed low-grade fever. Despite oral antibiotics he continued to have progression of her infection.  She was hospitalized overnight and started on intravenous antibiotics. Clinically she feels well and has no complaints at this time. She still have tenderness around that area but no other symptoms.  Objective:  Vital signs in last 24 hours: Temp:  [98.9 F (37.2 C)-100.6 F (38.1 C)] 98.9 F (37.2 C) (11/16 0547) Pulse Rate:  [80-108] 86 (11/16 0427) Resp:  [18-20] 18 (11/16 0427) BP: (99-118)/(56-78) 99/56 (11/16 0427) SpO2:  [92 %-97 %] 92 % (11/16 0427) Weight:  [160 lb 7.9 oz (72.8 kg)-161 lb (73 kg)] 160 lb 7.9 oz (72.8 kg) (11/15 2130) Weight change:  Last BM Date:  (unknown by patietn)  Intake/Output from previous day: 11/15 0701 - 11/16 0700 In: 2000.4 [P.O.:240; I.V.:510.4; IV Piggyback:1250] Out: -  Alert, awake woman without distress. Mouth: mucous membranes moist, pharynx normal without lesions Resp: clear to auscultation bilaterally  Chest wall examination: Revealed an open area of induration and drainage on the right side of her sternum. No masses palpated. Cardio: regular rate and rhythm, S1, S2 normal, no murmur, click, rub or gallop GI: soft, non-tender; bowel sounds normal; no masses,  no organomegaly Extremities: extremities normal, atraumatic, no cyanosis or edema    Lab Results:  Recent Labs  01/04/16 1720 01/05/16 0340  WBC 11.5* 8.4  HGB 9.7* 8.0*  HCT 31.7* 26.2*  PLT 83* 58*    BMET  Recent Labs  01/04/16 1720 01/05/16 0340  NA 137 139  K 4.8 4.8  CL 102 107  CO2 28 26  GLUCOSE 93 93  BUN 12 10  CREATININE 0.74 0.66  CALCIUM 9.6 8.9    Studies/Results: Dg  Chest 2 View  Result Date: 01/04/2016 CLINICAL DATA:  Fever x2 days, cellulitis of right chest EXAM: CHEST  2 VIEW COMPARISON:  CT chest dated 10/25/2015 FINDINGS: Mild patchy right lower lobe opacity, atelectasis versus pneumonia. Left lung is clear. No pleural effusion or pneumothorax. The heart is normal in size. Right chest power port terminates at the cavoatrial junction. IMPRESSION: Mild patchy right lower lobe opacity, atelectasis versus pneumonia. Electronically Signed   By: Julian Hy M.D.   On: 01/04/2016 18:23    Medications: I have reviewed the patient's current medications.  Assessment/Plan:  60 year old woman with the following issues:  1. Chest wall cellulitis: currently receiving intravenous antibiotics without any signs symptoms of sepsis. Radiology of that infection is unclear she could have local recurrence from her previous breast cancer into the chest wall but I think that is less likely. Her lumpectomy was over 10 years ago and I will be extremely unlikely to have recurrence in that area and has not been detected on mammograms as well as imaging studies.  She had a CT scan of the chest in September 2017 which showed no evidence of any chest wall abnormalities.  I recommend continuing with antibiotic management as you're doing. If she is not healing appropriately, surgical consultation might be needed for possible debridement if felt necessary. I do not think a biopsy at this point is needed.  2. History of peritoneal carcinomatosis: She is currently receiving carboplatin IV chemotherapy as scheduled for her next treatment next  week. This will be held if her infection is not fully healed at the time.  Please call with any questions regarding this pleasant woman.    LOS: 1 day   Laneya Gasaway 01/05/2016, 1:10 PM

## 2016-01-05 NOTE — Progress Notes (Signed)
PROGRESS NOTE  [name]  [id number] DOB: [birth year] DOA: 01/04/2016 PCP: Betty [name], MD  Outpatient Specialists: Dr. Alen Blew, oncology  Brief Narrative: [name] is a 60 y.o. female with a history of ovarian CA with peritoneal carcinomatosis Dx 2014 on chemotherapy, remote right breast CA s/p lumpectomy, chemo, radiation 1989 and recent Dx left breast CA. Also has CAD s/p DES in Sept 2017. She presented with general malaise, worsening redness, swelling, tenderness on right chest near site of lumpectomy in 1989. These symptoms failed to improve with CTX x1 and bactrim prescribed by PCP. On arrival temperature was 100.55F and erythematous right breast was noted. Attempted I&D was not productive of much purulence. IV antibiotics were started and she was admitted for abscess and cellulitis.  Assessment & Plan: Principal Problem:   Cellulitis of right breast Active Problems:   BREAST CANCER, HX OF   Malignant neoplasm of female breast ([place])   Primary peritoneal carcinomatosis ([place])   Coronary artery disease involving native heart without angina pectoris   Sepsis ([place])  Cellulitis and abscess of right breast: With mild sepsis now resolved. Per Dr. Alen Blew, local recurrence of breast cancer >10 years following lumpectomy with negative mammograms. No biopsy recommended at this time. - Continue vancomycin - Abscess draining, no indication for further I&D at this time, though loculations appear to be present so this may yet be required. Will consider debridement by surgery if worsening.  - IVF - Hold home anti-hypertensives - Has remained in normal sinus rhythm, D/C telemetry  Peritoneal carcinomatosis and GYN malignancy:  - Undergoin chemo for ovarian and breast CA.  - Next chemo after Thanksgiving, will be held if infection not completely resolved.   CAD s/p DES in Sept 2017: No angina, low suspicion for ACS. - Continue ASA/plavix  DVT prophylaxis: Lovenox Code Status:  Full Family Communication: Husband at bedside Disposition Plan: Continued IV abx, discharge once improving on po abx.  Consultants:   Dr. Alen Blew, oncology  Procedures:   I&D 11/15  Antimicrobials:  Vancomycin 11/15 >>   Subjective: [name] feels nearly the same as admission. Still very tender redness on medial right breast with active drainage. Afebrile since admission.  Objective: Vitals:   01/04/16 2130 01/05/16 0427 01/05/16 0547 01/05/16 1400  BP: 109/64 (!) 99/56  102/60  Pulse: 80 86  82  Resp:  18  18  Temp: 99.1 F (37.3 C) 100.1 F (37.8 C) 98.9 F (37.2 C) 98 F (36.7 C)  TempSrc: Oral Oral Oral Oral  SpO2: 97% 92%  94%  Weight: 72.8 kg (160 lb 7.9 oz)     Height: 5\' 1"  (1.549 m)       Intake/Output Summary (Last 24 hours) at 01/05/16 2047 Last data filed at 01/05/16 0200  Gross per 24 hour  Intake          1000.42 ml  Output                0 ml  Net          1000.42 ml   Filed Weights   01/04/16 1849 01/04/16 2130  Weight: 73 kg (161 lb) 72.8 kg (160 lb 7.9 oz)    Examination: General exam: 60 y.o. female in no distress Respiratory system: Non-labored breathing room air. Clear to auscultation bilaterally.  Cardiovascular system: Regular rate and rhythm. No murmur, rub, or gallop. No JVD, and no pedal edema. Gastrointestinal system: Abdomen soft, non-tender, non-distended, with normoactive bowel sounds. No organomegaly or masses  felt. Central nervous system: Alert and oriented. No focal neurological deficits. Extremities: Warm, no deformities Skin: Irregular erythema spreading from central raised draining abscess at 3 o'clock of right breast near sternum to right breast not involving nipple and partially across midline to the left. Purulence expressed with compression of abscess. No adenopathy noted. Psychiatry: Judgement and insight appear normal. Mood & affect appropriate.   Data Reviewed: I have personally reviewed following labs and imaging  studies  CBC:  Recent Labs Lab 01/04/16 1720 01/05/16 0340  WBC 11.5* 8.4  NEUTROABS 8.6*  --   HGB 9.7* 8.0*  HCT 31.7* 26.2*  MCV 104.6* 102.3*  PLT 83* 58*   Basic Metabolic Panel:  Recent Labs Lab 01/04/16 1720 01/05/16 0340  NA 137 139  K 4.8 4.8  CL 102 107  CO2 28 26  GLUCOSE 93 93  BUN 12 10  CREATININE 0.74 0.66  CALCIUM 9.6 8.9   GFR: Estimated Creatinine Clearance: 69.9 mL/min (by C-G formula based on SCr of 0.66 mg/dL). Liver Function Tests:  Recent Labs Lab 01/04/16 1720  AST 15  ALT 11*  ALKPHOS 98  BILITOT 0.7  PROT 8.5*  ALBUMIN 4.4   No results for input(s): LIPASE, AMYLASE in the last 168 hours. No results for input(s): AMMONIA in the last 168 hours. Coagulation Profile: No results for input(s): INR, PROTIME in the last 168 hours. Cardiac Enzymes: No results for input(s): CKTOTAL, CKMB, CKMBINDEX, TROPONINI in the last 168 hours. BNP (last 3 results) No results for input(s): PROBNP in the last 8760 hours. HbA1C: No results for input(s): HGBA1C in the last 72 hours. CBG: No results for input(s): GLUCAP in the last 168 hours. Lipid Profile: No results for input(s): CHOL, HDL, LDLCALC, TRIG, CHOLHDL, LDLDIRECT in the last 72 hours. Thyroid Function Tests: No results for input(s): TSH, T4TOTAL, FREET4, T3FREE, THYROIDAB in the last 72 hours. Anemia Panel: No results for input(s): VITAMINB12, FOLATE, FERRITIN, TIBC, IRON, RETICCTPCT in the last 72 hours. Urine analysis:    Component Value Date/Time   COLORURINE AMBER (A) 01/04/2016 1720   APPEARANCEUR CLEAR 01/04/2016 1720   LABSPEC 1.034 (H) 01/04/2016 1720   LABSPEC 1.010 07/15/2015 0944   PHURINE 6.5 01/04/2016 1720   GLUCOSEU NEGATIVE 01/04/2016 1720   GLUCOSEU Negative 07/15/2015 0944   HGBUR NEGATIVE 01/04/2016 1720   BILIRUBINUR SMALL (A) 01/04/2016 1720   BILIRUBINUR Negative 07/15/2015 0944   KETONESUR NEGATIVE 01/04/2016 1720   PROTEINUR NEGATIVE 01/04/2016 1720    UROBILINOGEN 0.2 07/15/2015 0944   NITRITE NEGATIVE 01/04/2016 1720   LEUKOCYTESUR SMALL (A) 01/04/2016 1720   LEUKOCYTESUR Negative 07/15/2015 0944   Sepsis Labs: @LABRCNTIP (procalcitonin:4,lacticidven:4)  ) Recent Results (from the past 240 hour(s))  Urine culture     Status: None   Collection Time: 01/04/16  5:20 PM  Result Value Ref Range Status   Specimen Description URINE, CLEAN CATCH  Final   Special Requests NONE  Final   Culture NO GROWTH Performed at [facility]   Final   Report Status 01/05/2016 FINAL  Final  Culture, blood (Routine x 2)     Status: None (Preliminary result)   Collection Time: 01/04/16  5:29 PM  Result Value Ref Range Status   Specimen Description LEFT ANTECUBITAL  Final   Special Requests BOTTLES DRAWN AEROBIC AND ANAEROBIC 5CC  Final   Culture   Final    NO GROWTH < 24 HOURS Performed at [facility]    Report Status PENDING  Incomplete  Culture, blood (Routine x 2)     Status: None (Preliminary result)   Collection Time: 01/04/16  5:44 PM  Result Value Ref Range Status   Specimen Description BLOOD LEFT FOREARM  Final   Special Requests BOTTLES DRAWN AEROBIC AND ANAEROBIC 5CC  Final   Culture   Final    NO GROWTH < 24 HOURS Performed at [facility]    Report Status PENDING  Incomplete     Radiology Studies: Dg Chest 2 View  Result Date: 01/04/2016 CLINICAL DATA:  Fever x2 days, cellulitis of right chest EXAM: CHEST  2 VIEW COMPARISON:  CT chest dated 10/25/2015 FINDINGS: Mild patchy right lower lobe opacity, atelectasis versus pneumonia. Left lung is clear. No pleural effusion or pneumothorax. The heart is normal in size. Right chest power port terminates at the cavoatrial junction. IMPRESSION: Mild patchy right lower lobe opacity, atelectasis versus pneumonia. Electronically Signed   By: Julian Hy M.D.   On: 01/04/2016 18:23    Scheduled Meds: . aspirin  81 mg Oral Daily  . atorvastatin  40 mg Oral q1800   . clopidogrel  75 mg Oral Q breakfast  . divalproex  500 mg Oral Daily  . divalproex  750 mg Oral QHS  . enoxaparin (LOVENOX) injection  40 mg Subcutaneous Q24H  . fluticasone  2 spray Each Nare QHS  . gabapentin  200 mg Oral BID  . lamoTRIgine  100 mg Oral BID  . levothyroxine  100 mcg Oral QAC breakfast  . pantoprazole  40 mg Oral Daily  . rOPINIRole  1.5 mg Oral QHS  . vancomycin  750 mg Intravenous Q12H   Continuous Infusions: . sodium chloride 125 mL/hr at 01/05/16 1745     LOS: 1 day   Time spent: 25 minutes.  Vance Gather, MD [facility] Pager [phone]  If 7PM-7AM, please contact night-coverage [email or web address] Password [facility] 01/05/2016, 8:47 PM

## 2016-01-06 ENCOUNTER — Encounter (HOSPITAL_COMMUNITY): Payer: PPO

## 2016-01-06 ENCOUNTER — Other Ambulatory Visit: Payer: Self-pay | Admitting: Pharmacist

## 2016-01-06 ENCOUNTER — Ambulatory Visit (HOSPITAL_COMMUNITY): Payer: PPO

## 2016-01-06 ENCOUNTER — Ambulatory Visit: Payer: Self-pay | Admitting: Pharmacist

## 2016-01-06 DIAGNOSIS — N61 Mastitis without abscess: Secondary | ICD-10-CM | POA: Diagnosis not present

## 2016-01-06 LAB — BASIC METABOLIC PANEL
ANION GAP: 5 (ref 5–15)
BUN: 9 mg/dL (ref 6–20)
CO2: 25 mmol/L (ref 22–32)
CREATININE: 0.61 mg/dL (ref 0.44–1.00)
Calcium: 9 mg/dL (ref 8.9–10.3)
Chloride: 108 mmol/L (ref 101–111)
GFR calc Af Amer: >60 mL/min (ref >60)
GFR calc non Af Amer: >60 mL/min (ref >60)
GLUCOSE: 94 mg/dL (ref 65–99)
POTASSIUM: 4.8 mmol/L (ref 3.5–5.1)
Sodium: 138 mmol/L (ref 135–145)

## 2016-01-06 LAB — CBC
HCT: 25.3 % — ABNORMAL LOW (ref 36.0–46.0)
Hemoglobin: 7.9 g/dL — ABNORMAL LOW (ref 12.0–15.0)
MCH: 32.5 pg (ref 26.0–34.0)
MCHC: 31.2 g/dL (ref 30.0–36.0)
MCV: 104.1 fL — ABNORMAL HIGH (ref 78.0–100.0)
PLATELETS: 54 10*3/uL — AB (ref 150–400)
RBC: 2.43 MIL/uL — AB (ref 3.87–5.11)
RDW: 16.2 % — AB (ref 11.5–15.5)
WBC: 7.4 10*3/uL (ref 4.0–10.5)

## 2016-01-06 LAB — VANCOMYCIN, TROUGH: Vancomycin Tr: 12 ug/mL — ABNORMAL LOW (ref 15–20)

## 2016-01-06 MED ORDER — MUPIROCIN 2 % EX OINT
TOPICAL_OINTMENT | Freq: Two times a day (BID) | CUTANEOUS | Status: DC
  Administered 2016-01-06 – 2016-01-11 (×11): via TOPICAL
  Filled 2016-01-06 (×2): qty 22

## 2016-01-06 MED ORDER — POLYETHYLENE GLYCOL 3350 17 G PO PACK
17.0000 g | PACK | Freq: Two times a day (BID) | ORAL | Status: DC
  Administered 2016-01-06 – 2016-01-11 (×10): 17 g via ORAL
  Filled 2016-01-06 (×10): qty 1

## 2016-01-06 NOTE — Progress Notes (Signed)
PROGRESS NOTE  [name]  [id number] DOB: 07/01/1955 DOA: 01/04/2016 PCP: Betty [name], MD  Outpatient Specialists: Dr. Alen Blew, oncology  Brief Narrative: [name] is a 60 y.o. female with a history of ovarian CA with peritoneal carcinomatosis Dx 2014 on chemotherapy, remote right breast CA s/p lumpectomy, chemo, radiation 1989 and recent Dx left breast CA. Also has CAD s/p DES in Sept 2017. She presented with general malaise, worsening redness, swelling, tenderness on right chest near site of lumpectomy in 1989. These symptoms failed to improve with CTX x1 and bactrim prescribed by PCP. On arrival temperature was 100.50F and erythematous right breast was noted. Attempted I&D was not productive of much purulence. IV antibiotics were started and she was admitted for abscess and cellulitis.Wound has continued to drain, erythema improving.  Assessment & Plan: Principal Problem:   Cellulitis of right breast Active Problems:   BREAST CANCER, HX OF   Malignant neoplasm of female breast ([place])   Primary peritoneal carcinomatosis ([place])   Coronary artery disease involving native heart without angina pectoris   Sepsis ([place])  Cellulitis and abscess of right breast: With mild sepsis now resolved. Per Dr. Alen Blew, local recurrence of breast cancer >10 years following lumpectomy with negative mammograms. No biopsy recommended at this time. - Continue vancomycin - Abscess draining, no indication for further I&D at this time, though loculations appear to be present so this may yet be required. Will consider debridement by surgery if worsening.  - Hold home anti-hypertensives due to relative hypotension - Has remained in normal sinus rhythm, D/C telemetry  Peritoneal carcinomatosis and GYN malignancy:  - Undergoing chemo for ovarian and breast CA.  - Next chemo after Thanksgiving, will be held if infection not completely resolved.   CAD s/p DES in Sept 2017: No angina, low suspicion for  ACS. - Continue ASA/plavix  DVT prophylaxis: Lovenox Code Status: Full Family Communication: Husband at bedside Disposition Plan: Continued IV abx, discharge once improving on po abx.  Consultants:   Dr. Alen Blew, oncology  Procedures:   I&D 11/15  Antimicrobials:  Vancomycin 11/15 >>   Subjective: Pain and redness improved, still draining. 100.31F overnight. No chest pain, dyspnea.   Objective: Vitals:   01/05/16 1400 01/05/16 2100 01/06/16 0530 01/06/16 1355  BP: 102/60 132/76 133/75 (!) 104/57  Pulse: 82 92 84 85  Resp: 18 18 18 16   Temp: 98 F (36.7 C) 100.3 F (37.9 C) 99.5 F (37.5 C) 100.3 F (37.9 C)  TempSrc: Oral Oral Oral Oral  SpO2: 94% 100% 100% 97%  Weight:      Height:        Intake/Output Summary (Last 24 hours) at 01/06/16 1544 Last data filed at 01/06/16 1500  Gross per 24 hour  Intake              660 ml  Output                0 ml  Net              660 ml   Filed Weights   01/04/16 1849 01/04/16 2130  Weight: 73 kg (161 lb) 72.8 kg (160 lb 7.9 oz)    Examination: General exam: 60 y.o. female in no distress Respiratory system: Non-labored breathing room air. Clear to auscultation bilaterally.  Cardiovascular system: Regular rate and rhythm. No murmur, rub, or gallop. No JVD, and no pedal edema. Gastrointestinal system: Abdomen soft, non-tender, non-distended, with normoactive bowel sounds. No organomegaly or masses  felt. Central nervous system: Alert and oriented. No focal neurological deficits. Extremities: Warm, no deformities Skin: Irregular erythema spreading from central raised draining abscess at 3 o'clock of right breast near sternum to right breast not involving nipple and partially across midline to the left. Purulence expressed with compression of abscess. No adenopathy noted. Psychiatry: Judgement and insight appear normal. Mood & affect appropriate.   Data Reviewed: I have personally reviewed following labs and imaging  studies  CBC:  Recent Labs Lab 01/04/16 1720 01/05/16 0340 01/06/16 0811  WBC 11.5* 8.4 7.4  NEUTROABS 8.6*  --   --   HGB 9.7* 8.0* 7.9*  HCT 31.7* 26.2* 25.3*  MCV 104.6* 102.3* 104.1*  PLT 83* 58* 54*   Basic Metabolic Panel:  Recent Labs Lab 01/04/16 1720 01/05/16 0340 01/06/16 0811  NA 137 139 138  K 4.8 4.8 4.8  CL 102 107 108  CO2 28 26 25   GLUCOSE 93 93 94  BUN 12 10 9   CREATININE 0.74 0.66 0.61  CALCIUM 9.6 8.9 9.0   GFR: Estimated Creatinine Clearance: 69.9 mL/min (by C-G formula based on SCr of 0.61 mg/dL). Liver Function Tests:  Recent Labs Lab 01/04/16 1720  AST 15  ALT 11*  ALKPHOS 98  BILITOT 0.7  PROT 8.5*  ALBUMIN 4.4   No results for input(s): LIPASE, AMYLASE in the last 168 hours. No results for input(s): AMMONIA in the last 168 hours. Coagulation Profile: No results for input(s): INR, PROTIME in the last 168 hours. Cardiac Enzymes: No results for input(s): CKTOTAL, CKMB, CKMBINDEX, TROPONINI in the last 168 hours. BNP (last 3 results) No results for input(s): PROBNP in the last 8760 hours. HbA1C: No results for input(s): HGBA1C in the last 72 hours. CBG: No results for input(s): GLUCAP in the last 168 hours. Lipid Profile: No results for input(s): CHOL, HDL, LDLCALC, TRIG, CHOLHDL, LDLDIRECT in the last 72 hours. Thyroid Function Tests: No results for input(s): TSH, T4TOTAL, FREET4, T3FREE, THYROIDAB in the last 72 hours. Anemia Panel: No results for input(s): VITAMINB12, FOLATE, FERRITIN, TIBC, IRON, RETICCTPCT in the last 72 hours. Urine analysis:    Component Value Date/Time   COLORURINE AMBER (A) 01/04/2016 1720   APPEARANCEUR CLEAR 01/04/2016 1720   LABSPEC 1.034 (H) 01/04/2016 1720   LABSPEC 1.010 07/15/2015 0944   PHURINE 6.5 01/04/2016 1720   GLUCOSEU NEGATIVE 01/04/2016 1720   GLUCOSEU Negative 07/15/2015 0944   HGBUR NEGATIVE 01/04/2016 1720   BILIRUBINUR SMALL (A) 01/04/2016 1720   BILIRUBINUR Negative  07/15/2015 0944   KETONESUR NEGATIVE 01/04/2016 1720   PROTEINUR NEGATIVE 01/04/2016 1720   UROBILINOGEN 0.2 07/15/2015 0944   NITRITE NEGATIVE 01/04/2016 1720   LEUKOCYTESUR SMALL (A) 01/04/2016 1720   LEUKOCYTESUR Negative 07/15/2015 0944   Sepsis Labs: @LABRCNTIP (procalcitonin:4,lacticidven:4)  ) Recent Results (from the past 240 hour(s))  Urine culture     Status: None   Collection Time: 01/04/16  5:20 PM  Result Value Ref Range Status   Specimen Description URINE, CLEAN CATCH  Final   Special Requests NONE  Final   Culture NO GROWTH Performed at [facility]   Final   Report Status 01/05/2016 FINAL  Final  Culture, blood (Routine x 2)     Status: None (Preliminary result)   Collection Time: 01/04/16  5:29 PM  Result Value Ref Range Status   Specimen Description LEFT ANTECUBITAL  Final   Special Requests BOTTLES DRAWN AEROBIC AND ANAEROBIC 5CC  Final   Culture   Final  NO GROWTH 2 DAYS Performed at [facility]    Report Status PENDING  Incomplete  Culture, blood (Routine x 2)     Status: None (Preliminary result)   Collection Time: 01/04/16  5:44 PM  Result Value Ref Range Status   Specimen Description BLOOD LEFT FOREARM  Final   Special Requests BOTTLES DRAWN AEROBIC AND ANAEROBIC 5CC  Final   Culture   Final    NO GROWTH 2 DAYS Performed at [facility]    Report Status PENDING  Incomplete     Radiology Studies: Dg Chest 2 View  Result Date: 01/04/2016 CLINICAL DATA:  Fever x2 days, cellulitis of right chest EXAM: CHEST  2 VIEW COMPARISON:  CT chest dated 10/25/2015 FINDINGS: Mild patchy right lower lobe opacity, atelectasis versus pneumonia. Left lung is clear. No pleural effusion or pneumothorax. The heart is normal in size. Right chest power port terminates at the cavoatrial junction. IMPRESSION: Mild patchy right lower lobe opacity, atelectasis versus pneumonia. Electronically Signed   By: Julian Hy M.D.   On: 01/04/2016 18:23     Scheduled Meds: . aspirin  81 mg Oral Daily  . atorvastatin  40 mg Oral q1800  . clopidogrel  75 mg Oral Q breakfast  . divalproex  500 mg Oral Daily  . divalproex  750 mg Oral QHS  . fluticasone  2 spray Each Nare QHS  . gabapentin  200 mg Oral BID  . lamoTRIgine  100 mg Oral BID  . levothyroxine  100 mcg Oral QAC breakfast  . mupirocin ointment   Topical BID  . pantoprazole  40 mg Oral Daily  . rOPINIRole  1.5 mg Oral QHS  . vancomycin  750 mg Intravenous Q12H   Continuous Infusions:    LOS: 2 days   Time spent: 25 minutes.  Vance Gather, MD [facility] Pager [phone]  If 7PM-7AM, please contact night-coverage [email or web address] Password [facility] 01/06/2016, 3:44 PM

## 2016-01-06 NOTE — Progress Notes (Signed)
Pt had epitaxis at 0000 that lasted about 45 min.

## 2016-01-06 NOTE — Progress Notes (Signed)
Pharmacy - Brief Note: vancomycin level follow-up  [name] is a 60 y.o. female admitted on 01/04/2016 with cellulitis at R mastectomy site. She has a past medical history significant for GYN cancer, active left breast cancer and right breast cancer in remission. Last chemotherapy on 12/22/15 and peg-filgrastim administered on 11/3 due to hx of neutropenia.    Antimicrobials this admission: Vancomycin 11/15 >>   Dose adjustments this admission: 11/17: 1700 VT = 50mcg/mL on Vanc 750mg  BID (before 4th dose of this regimen, prior to 5th dose overall)  Microbiology results: 11/15 BCx: NGTD 11/15 UCx: NGTD  Plan:  Vancomycin trough within goal range (trough 10-15 mcg/mL), continue vancomycin 750mg  IV q12h  Continue to monitor renal function, cultures, and for clinical improvement  Doreene Eland, PharmD, BCPS.   Pager: [phone] 01/06/2016 5:43 PM

## 2016-01-06 NOTE — Progress Notes (Signed)
Pharmacy Antibiotic Note  [name] is a 59 y.o. female admitted on 01/04/2016 with cellulitis at R mastectomy site. She has a past medical history significant for GYN cancer, active left breast cancer and right breast cancer in remission. Last chemotherapy on 12/22/15 and peg-filgrastim administered on 11/3 due to hx of neutropenia.   Symptoms of swelling and redness started on Sunday, and worsened w/ fevers. At her PCP, she was given Rocephin IM and Bactrim x 1 dose on 11/15, but cellulitis worse upon presentation to ED. In ED, I&D attempted w/ little purulent drainage. Vancomycin was started for cellulitis.   Today, 01/06/16  - Day 2 abx - Third dose of Vancomycin today at 0600. - Low grade fever (100.3) yesterday and 99.5 this AM. Trending down. - WBC trending down to 7.4. - SCr stable - No further I&D planned at this time.   Plan: - Continue Vancomycin 750 mg IV q12h.  - Vanc trough today at 1700 before next dose. Goal trough 10-15 mcg/mL  - Monitor renal fxn, CBC and clinical course  Height: 5\' 1"  (154.9 cm) Weight: 160 lb 7.9 oz (72.8 kg) IBW/kg (Calculated) : 47.8  Temp (24hrs), Avg:99.3 F (37.4 C), Min:98 F (36.7 C), Max:100.3 F (37.9 C)   Recent Labs Lab 01/04/16 1720 01/04/16 1750 01/05/16 0340 01/06/16 0811  WBC 11.5*  --  8.4 7.4  CREATININE 0.74  --  0.66 0.61  LATICACIDVEN  --  1.36  --   --     Estimated Creatinine Clearance: 69.9 mL/min (by C-G formula based on SCr of 0.61 mg/dL).    Allergies  Allergen Reactions  . Doxil [Doxorubicin Hcl Liposomal] Anaphylaxis    1st Doxil.   . Pollen Extract Other (See Comments)    Pollen and grass causes a lot sneezing    Antimicrobials this admission: Vancomycin 11/15 >>   Dose adjustments this admission: 11/17: 1700 VT _______ on Vanc 750mg  BID (before 4th dose)  Microbiology results: 11/15 BCx: NGTD 11/15 UCx: NGTD  Thank you for allowing pharmacy to be a part of this patient's care.  Sallyanne Havers Student-PharmD 01/06/2016, 10:20 AM

## 2016-01-06 NOTE — Patient Outreach (Signed)
Note patient currently inpatient at [facility], admitted 01/04/16. Patient seen by [facility] Liaison [place] on 01/05/16. Per note from [place], patient declined [facility] services. Place a coordination of care call to [place] to confirm. Will close pharmacy episode at this time.    Harlow Asa, PharmD Clinical Pharmacist [place] [facility] [phone]

## 2016-01-07 DIAGNOSIS — N61 Mastitis without abscess: Secondary | ICD-10-CM | POA: Diagnosis not present

## 2016-01-07 LAB — CBC
HEMATOCRIT: 25.4 % — AB (ref 36.0–46.0)
HEMOGLOBIN: 7.9 g/dL — AB (ref 12.0–15.0)
MCH: 32.1 pg (ref 26.0–34.0)
MCHC: 31.1 g/dL (ref 30.0–36.0)
MCV: 103.3 fL — AB (ref 78.0–100.0)
Platelets: 53 10*3/uL — ABNORMAL LOW (ref 150–400)
RBC: 2.46 MIL/uL — AB (ref 3.87–5.11)
RDW: 15.9 % — ABNORMAL HIGH (ref 11.5–15.5)
WBC: 7.2 10*3/uL (ref 4.0–10.5)

## 2016-01-07 LAB — BASIC METABOLIC PANEL
Anion gap: 7 (ref 5–15)
BUN: 12 mg/dL (ref 6–20)
CHLORIDE: 102 mmol/L (ref 101–111)
CO2: 27 mmol/L (ref 22–32)
CREATININE: 0.62 mg/dL (ref 0.44–1.00)
Calcium: 9.1 mg/dL (ref 8.9–10.3)
GFR calc Af Amer: >60 mL/min (ref >60)
GFR calc non Af Amer: >60 mL/min (ref >60)
GLUCOSE: 117 mg/dL — AB (ref 65–99)
POTASSIUM: 4 mmol/L (ref 3.5–5.1)
SODIUM: 136 mmol/L (ref 135–145)

## 2016-01-07 NOTE — Consult Note (Signed)
Reason for Consult: Right chest wall/breast infection Referring Physician: Bonner Puna, MD  [name] is an 60 y.o. female.  HPI:  Pt is a 60 yo F admitted with right breast cellulitis with mild sepsis.  I am consulted by Dr. Bonner Puna for this infection to evaluate need for debridement or incision and drainage.    The patient has a 5 day history of right breast pain and redness.  She had right medial breast cancer 28 years ago that was treated with lumpectomy/radiation.  This site has been fine with normal scar thickening until this time and no changes in years.  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  She had routine mammograms in may and was found to have a new small LEFT breast cancer.  This was felt to be treated by the chemo for her peritoneal carcinomatosis and surgery was not done at that time.  She has not had issues with that side.  Of note, she had angina and received drug eluting stent 2 months ago.  She is on Plavix and Aspirin for this.  CTA during that time to evaluate for PE as part of workup was negative for fluid collection/infection at that time.    Port is on that side and she received chemo via port 2-3 weeks ago.  In the last few days, the redness of the breast has become a firm tender area that is draining and raised.  It is painful, but has gotten less so since she was admitted and placed on IV antibiotics.    Also since her cath she has been wearing a pendant with her nitro pills that has a butterfly charm on it.  The infection is in the region where the charm lays.  She sleeps with it as well and sometimes on her side.    Past Medical History:  Diagnosis Date  . ADD (attention deficit disorder)   . Allergy    seasonal  . Anxiety   . Bipolar disorder ([place])   . Breast cancer ([place]) 1989   right breast  . Cancer ([place])    Omentum  . Coronary artery disease   . Depression   . Diabetes mellitus    pt denies DM noe meds  .  Hematochezia   . History of echocardiogram    a. Limited Echo 9/17: EF 60-65%, no RWMA, Gr 1 DD, trivial AI, trivial MR, GLS -12% (likely underestimated), no pericardial effusion  . Hyperlipidemia   . Hypothyroidism   . Maintenance chemotherapy    Pt has chemo every 3 weeks (on Friday)  . NSTEMI (non-ST elevated myocardial infarction) ([place]) 10/26/2015  . Osteopenia 03/2009   t score -2.1 FRAX 4.6/0.4  . Premature menopause   . Sleep apnea    mild    Past Surgical History:  Procedure Laterality Date  . BREAST SURGERY  1989   RIGHT LUMPECTOMY, RADIATION AND CHEMO  . CARDIAC CATHETERIZATION N/A 10/26/2015   Procedure: Left Heart Cath and Coronary Angiography;  Surgeon: Wellington Hampshire, MD;  Location: [facility];  Service: Cardiovascular;  Laterality: N/A;  . CARDIAC CATHETERIZATION N/A 10/26/2015   Procedure: Coronary Stent Intervention;  Surgeon: Wellington Hampshire, MD;  Location: [facility];  Service: Cardiovascular;  Laterality: N/A;  . CARDIAC CATHETERIZATION N/A 11/08/2015   Procedure: Left Heart Cath and Coronary Angiography;  Surgeon: Jolaine Artist, MD;  Location: [facility];  Service: Cardiovascular;  Laterality: N/A;  .  drug eluting stent  10/26/2015  . FOOT SURGERY  2013   BILATERAL   . HYSTEROSCOPY  2011   [place]  . PELVIC LAPAROSCOPY/ Hysteroscopy  1996    Family History  Problem Relation Age of Onset  . Breast cancer Maternal Aunt 29  . Diabetes Maternal Grandmother   . Heart disease Maternal Grandmother   . Heart disease Maternal Grandfather   . Hypertension Paternal Grandfather   . Heart Problems Paternal Grandfather   . Breast cancer Maternal Aunt     dx. early 34s; had negative GT approx 10 years ago  . Breast cancer Maternal Aunt     dx. 51s with recurrence  . Allergies Father   . Heart disease Mother   . Other Mother     hx of hysterectomy   . Liver cancer Cousin 80    +EtOH  . Cancer Cousin     paternal 1st cousin,  once-removed dx. NOS cancer (maybe ovarian) in late 30s-40s  . Cancer Cousin     female paternal 2nd cousin d. of NOS cancer in her 20s-early 43s  . Ovarian cancer Neg Hx   . Colon cancer Neg Hx     Social History:  reports that she has never smoked. She has never used smokeless tobacco. She reports that she drinks alcohol. She reports that she does not use drugs.  Allergies:  Allergies  Allergen Reactions  . Doxil [Doxorubicin Hcl Liposomal] Anaphylaxis    1st Doxil.   . Pollen Extract Other (See Comments)    Pollen and grass causes a lot sneezing    Medications:  Prior to Admission:  Prescriptions Prior to Admission  Medication Sig Dispense Refill Last Dose  . Acetylcarnitine HCl (ACETYL L-CARNITINE PO) Take 2,000 mg by mouth at bedtime.   01/03/2016 at Unknown time  . ALPRAZolam (XANAX) 0.5 MG tablet Take 0.5 mg by mouth 2 (two) times daily as needed for anxiety.   01/03/2016 at Unknown time  . aspirin 81 MG chewable tablet Chew 1 tablet (81 mg total) by mouth daily. 30 tablet 0 01/04/2016 at Unknown time  . atorvastatin (LIPITOR) 40 MG tablet Take 1 tablet (40 mg total) by mouth daily at 6 PM. 30 tablet 0 01/03/2016 at Unknown time  . carvedilol (COREG) 3.125 MG tablet Take 0.5 tablets (1.56 mg total) by mouth 2 (two) times daily with a meal. 30 tablet 3 01/04/2016 at 1100  . clopidogrel (PLAVIX) 75 MG tablet Take 1 tablet (75 mg total) by mouth daily with breakfast. 30 tablet 0 01/04/2016 at Unknown time  . divalproex (DEPAKOTE) 250 MG DR tablet Take by mouth. Take 531ms in the morning and 7521m at night   01/04/2016 at Unknown time  . fluticasone (FLONASE) 50 MCG/ACT nasal spray INHALE 2 SPRAYS INTO EACH NOSTRIL EVERY NIGHT  11 01/03/2016 at Unknown time  . gabapentin (NEURONTIN) 100 MG capsule Take 1 capsule (100 mg total) by mouth 3 (three) times daily. (Patient taking differently: Take 200 mg by mouth 2 (two) times daily. ) 90 capsule 2 01/04/2016 at 1100  . isosorbide  mononitrate (IMDUR) 30 MG 24 hr tablet Take 1 tablet (30 mg total) by mouth daily. (Patient taking differently: Take 15 mg by mouth daily. ) 90 tablet 3 01/04/2016 at Unknown time  . lamoTRIgine (LAMICTAL) 100 MG tablet Take 100 mg by mouth 2 (two) times daily.   01/04/2016 at 1100  . levothyroxine (SYNTHROID, LEVOTHROID) 100 MCG tablet Take 100 mcg by mouth daily before breakfast.  01/04/2016 at Unknown time  . lidocaine-prilocaine (EMLA) cream Apply topically as needed. Apply to port with every chemotherapy. 30 g 0 12/22/2015  . lisinopril (PRINIVIL,ZESTRIL) 2.5 MG tablet Take 1 tablet (2.5 mg total) by mouth daily. 30 tablet 0 01/04/2016 at Unknown time  . loperamide (IMODIUM A-D) 2 MG tablet Take 2 mg by mouth as needed for diarrhea or loose stools.   Unknown  . loratadine (CLARITIN) 10 MG tablet Take 10 mg by mouth daily as needed for allergies (with chemo).   Past Month at Unknown time  . nitroGLYCERIN (NITROSTAT) 0.4 MG SL tablet Place 1 tablet (0.4 mg total) under the tongue every 5 (five) minutes as needed for chest pain. 25 tablet 3 Past Month at Unknown time  . ondansetron (ZOFRAN) 8 MG tablet TAKE 1 TABLET BY MOUTH EVERY 8 HOURS AS NEEDED FOR NAUSEA AND VOMITING 30 tablet 2 01/04/2016 at 0100  . pantoprazole (PROTONIX) 40 MG tablet Take 1 tablet (40 mg total) by mouth daily. 30 tablet 11 01/04/2016 at Unknown time  . rOPINIRole (REQUIP) 0.5 MG tablet Take 1.5 mg by mouth at bedtime.    01/03/2016 at Unknown time  . sulfamethoxazole-trimethoprim (BACTRIM DS,SEPTRA DS) 800-160 MG tablet Take 1 tablet by mouth 2 (two) times daily. 14 tablet 0 01/04/2016 at Am  . HYDROcodone-acetaminophen (NORCO/VICODIN) 5-325 MG tablet Take 1 tablet by mouth every 8 (eight) hours as needed for moderate pain. 15 tablet 0 Not started yet    Results for orders placed or performed during the hospital encounter of 01/04/16 (from the past 48 hour(s))  CBC     Status: Abnormal   Collection Time: 01/06/16  8:11 AM   Result Value Ref Range   WBC 7.4 4.0 - 10.5 K/uL   RBC 2.43 (L) 3.87 - 5.11 MIL/uL   Hemoglobin 7.9 (L) 12.0 - 15.0 g/dL   HCT 25.3 (L) 36.0 - 46.0 %   MCV 104.1 (H) 78.0 - 100.0 fL   MCH 32.5 26.0 - 34.0 pg   MCHC 31.2 30.0 - 36.0 g/dL   RDW 16.2 (H) 11.5 - 15.5 %   Platelets 54 (L) 150 - 400 K/uL    Comment: CONSISTENT WITH PREVIOUS RESULT  Basic metabolic panel     Status: None   Collection Time: 01/06/16  8:11 AM  Result Value Ref Range   Sodium 138 135 - 145 mmol/L   Potassium 4.8 3.5 - 5.1 mmol/L   Chloride 108 101 - 111 mmol/L   CO2 25 22 - 32 mmol/L   Glucose, Bld 94 65 - 99 mg/dL   BUN 9 6 - 20 mg/dL   Creatinine, Ser 0.61 0.44 - 1.00 mg/dL   Calcium 9.0 8.9 - 10.3 mg/dL   GFR calc non Af Amer >60 >60 mL/min   GFR calc Af Amer >60 >60 mL/min    Comment: (NOTE) The eGFR has been calculated using the CKD EPI equation. This calculation has not been validated in all clinical situations. eGFR's persistently <60 mL/min signify possible Chronic Kidney Disease.    Anion gap 5 5 - 15  Vancomycin, trough     Status: Abnormal   Collection Time: 01/06/16  4:49 PM  Result Value Ref Range   Vancomycin Tr 12 (L) 15 - 20 ug/mL  CBC     Status: Abnormal   Collection Time: 01/07/16  6:51 AM  Result Value Ref Range   WBC 7.2 4.0 - 10.5 K/uL   RBC 2.46 (L) 3.87 - 5.11 MIL/uL  Hemoglobin 7.9 (L) 12.0 - 15.0 g/dL   HCT 25.4 (L) 36.0 - 46.0 %   MCV 103.3 (H) 78.0 - 100.0 fL   MCH 32.1 26.0 - 34.0 pg   MCHC 31.1 30.0 - 36.0 g/dL   RDW 15.9 (H) 11.5 - 15.5 %   Platelets 53 (L) 150 - 400 K/uL    Comment: CONSISTENT WITH PREVIOUS RESULT  Basic metabolic panel     Status: Abnormal   Collection Time: 01/07/16  6:51 AM  Result Value Ref Range   Sodium 136 135 - 145 mmol/L   Potassium 4.0 3.5 - 5.1 mmol/L   Chloride 102 101 - 111 mmol/L   CO2 27 22 - 32 mmol/L   Glucose, Bld 117 (H) 65 - 99 mg/dL   BUN 12 6 - 20 mg/dL   Creatinine, Ser 0.62 0.44 - 1.00 mg/dL   Calcium 9.1 8.9 -  10.3 mg/dL   GFR calc non Af Amer >60 >60 mL/min   GFR calc Af Amer >60 >60 mL/min    Comment: (NOTE) The eGFR has been calculated using the CKD EPI equation. This calculation has not been validated in all clinical situations. eGFR's persistently <60 mL/min signify possible Chronic Kidney Disease.    Anion gap 7 5 - 15    No results found.  Review of Systems  Constitutional: Positive for fever. Negative for chills, malaise/fatigue and weight loss.  HENT: Negative.   Eyes: Negative.   Respiratory: Negative.   Cardiovascular: Negative.   Gastrointestinal: Negative.   Genitourinary: Negative.   Musculoskeletal: Negative.   Skin:       Red lesion on breast.  Neurological:       Neuropathy from chemo  Psychiatric/Behavioral: Negative.    Blood pressure 123/73, pulse 92, temperature 98.9 F (37.2 C), temperature source Oral, resp. rate 18, height _0  (1.549 m), weight 72.8 kg (160 lb 7.9 oz), SpO2 97 %. Physical Exam  Constitutional: She is oriented to person, place, and time. She appears well-developed and well-nourished. No distress.  HENT:  Head: Normocephalic.  Mouth/Throat: Oropharynx is clear and moist.  Eyes: Conjunctivae are normal. Pupils are equal, round, and reactive to light. No scleral icterus.  Cardiovascular: Normal rate.   Respiratory: Effort normal. No respiratory distress.    Raised area around 3 cm with violaceous edges and some yellowish drainage. Appears to be necrotic skin/eschar vs infected tumor.  It appears that there are multiple small abscesses appear on surface.  Surrounding rim of erythema around 1-2 cm.  No fluctuance palpated.  Tender.    GI: Soft. She exhibits no distension. There is no tenderness.  Musculoskeletal: Normal range of motion.  Neurological: She is alert and oriented to person, place, and time. Coordination normal.  Skin: Skin is warm and dry. No rash noted. She is not diaphoretic. No erythema. No pallor.  See chest wall  description  Psychiatric: She has a normal mood and affect. Her behavior is normal. Judgment and thought content normal.    Assessment/Plan:  Right breast cellulitis and superficial wound. Immunosuppression due to chemotherapy CAD, s/p drug eluting stent placement 2 months ago Anti platelet therapy H/o right breast cancer Left breast cancer Peritoneal carcinomatosis  Appears to likely be MRSA infection with microabscesses in the skin.  Will plan ultrasound in AM to evaluate for deeper collection undrained fluid.    Continue mupirocin ointment and add warm compresses.  This is a difficult situation given her anti platelet agents and recent drug eluting stent  placement.    I do not think she has a deep abscess, but I think this necrotic area would get better faster with debridement, especially considering her immunosuppression.    This is unlikely to be tumor since it was fine 5 days ago.    I do think she likely had abrasion/superficial trauma to skin from nitro pill carrier/charm and developed infection.    She feels improved.  Will call cardiology tomorrow as well to weigh in on situation.    Risks of not debriding include potential worsening infection that could infect port or seed stent and delaying chemo.    Risks of debriding include significant bleeding as it is unlikely cardiology will be willing to hold plavix and potential cardiac event during anesthesia.    Will see what ultrasound shows (have discussed with Dr. Melanee Spry of radiology) and get input from Dr. Alen Blew and cardiology (Dr. Harrington Challenger is primary cards).  Will also see how infection changes over next 24-48 hours.  If drainage continues and cellulitis improves, surgery may not be necessary.    I think if unchanged, she will heal faster with surgery.    Teyton Pattillo 01/07/2016, 7:06 PM

## 2016-01-07 NOTE — Progress Notes (Signed)
PROGRESS NOTE  [name]  [id number] DOB: Mar 17, 1955 DOA: 01/04/2016 PCP: Betty [name], MD  Outpatient Specialists: Dr. Alen Blew, oncology  Brief Narrative: [name] is a 60 y.o. female with a history of ovarian CA with peritoneal carcinomatosis Dx 2014 on chemotherapy, remote right breast CA s/p lumpectomy, chemo, radiation 1989 and recent Dx left breast CA. Also has CAD s/p DES in Sept 2017. She presented with general malaise, worsening redness, swelling, tenderness on right chest near site of lumpectomy in 1989. These symptoms failed to improve with CTX x1 and bactrim prescribed by PCP. On arrival temperature was 100.76F and erythematous right breast was noted. Attempted I&D was not productive of much purulence. IV antibiotics were started and she was admitted for abscess and cellulitis.Wound has continued to drain, erythema improving.  Assessment & Plan: Principal Problem:   Cellulitis of right breast Active Problems:   BREAST CANCER, HX OF   Malignant neoplasm of female breast ([place])   Primary peritoneal carcinomatosis ([place])   Coronary artery disease involving native heart without angina pectoris   Sepsis ([place])  Cellulitis and abscess of right breast: With mild sepsis now resolved but continuing with low grade temperature. Per Dr. Alen Blew, local recurrence of breast cancer >10 years following lumpectomy with negative mammograms is exceedingly unlikely.  - Continue vancomycin, bactroban applied BID - Abscess/wound is irregular: consult surgery for consideration of debridement. Could get biopsy at that time as well.  - Hold home anti-hypertensives due to relative hypotension  Macrocytic anemia: Chronic due to chemotherapy. No signs of bleeding. - Monitor CBC, if oxygen requirement continues and hgb drops, will transfuse for symptomatic anemia.   Peritoneal carcinomatosis and GYN malignancy:  - Undergoing chemo for ovarian and breast CA.  - Next chemo after Thanksgiving,  will be held if infection not completely resolved.   CAD s/p DES in Sept 2017: No angina, low suspicion for ACS.  - Continue ASA, plavix, and statin - Will check HbA1c due to h/o T2DM  Depression: Chronic, stable - Continue lamictal and depakote. No indication to check level.  Hypothyroidism: Last TSH 5.035 in Sept 2017.  - Continue synthroid - Recheck TSH recommended outside scope of current illness.  GERD: Chronic, stable - Continue protonix  Restless legs: Chronic, stable - Continue ropinirole  DVT prophylaxis: Lovenox Code Status: Full Family Communication: Husband at bedside Disposition Plan: Continued IV abx and evaluation for debridement  Consultants:   Dr. Alen Blew, oncology  General surgery  Procedures:   I&D 11/15  Antimicrobials:  Vancomycin 11/15 >>   Subjective: Pain and redness improved, still draining. Low grade fever again overnight. No chest pain, dyspnea or other complaints.   Objective: Vitals:   01/06/16 1355 01/06/16 1935 01/06/16 2141 01/07/16 0527  BP: (!) 104/57 116/65  (!) 104/49  Pulse: 85 96  77  Resp: 16 20  18   Temp: 100.3 F (37.9 C) (!) 100.9 F (38.3 C) 99.8 F (37.7 C) 99.8 F (37.7 C)  TempSrc: Oral Oral Oral Oral  SpO2: 97% 91% 95% 97%  Weight:      Height:        Intake/Output Summary (Last 24 hours) at 01/07/16 1214 Last data filed at 01/07/16 0900  Gross per 24 hour  Intake             1330 ml  Output                0 ml  Net  1330 ml   Filed Weights   01/04/16 1849 01/04/16 2130  Weight: 73 kg (161 lb) 72.8 kg (160 lb 7.9 oz)    Examination: General exam: 60 y.o. female in no distress Respiratory system: Non-labored breathing room air. Clear to auscultation bilaterally.  Cardiovascular system: Regular rate and rhythm. No murmur, rub, or gallop. No JVD, and no pedal edema. Gastrointestinal system: Abdomen soft, non-tender, non-distended, with normoactive bowel sounds. No organomegaly or masses  felt. Central nervous system: Alert and oriented. No focal neurological deficits. Extremities: Warm, no deformities Skin: Irregular erythema spreading from central raised draining abscess at 3 o'clock of right breast near sternum extending to right breast not involving nipple and partially across midline to the left. Multiple loculations evident. No adenopathy noted.  Psychiatry: Judgement and insight appear normal. Mood & affect appropriate.   Data Reviewed: I have personally reviewed following labs and imaging studies  CBC:  Recent Labs Lab 01/04/16 1720 01/05/16 0340 01/06/16 0811 01/07/16 0651  WBC 11.5* 8.4 7.4 7.2  NEUTROABS 8.6*  --   --   --   HGB 9.7* 8.0* 7.9* 7.9*  HCT 31.7* 26.2* 25.3* 25.4*  MCV 104.6* 102.3* 104.1* 103.3*  PLT 83* 58* 54* 53*   Basic Metabolic Panel:  Recent Labs Lab 01/04/16 1720 01/05/16 0340 01/06/16 0811 01/07/16 0651  NA 137 139 138 136  K 4.8 4.8 4.8 4.0  CL 102 107 108 102  CO2 28 26 25 27   GLUCOSE 93 93 94 117*  BUN 12 10 9 12   CREATININE 0.74 0.66 0.61 0.62  CALCIUM 9.6 8.9 9.0 9.1   GFR: Estimated Creatinine Clearance: 69.9 mL/min (by C-G formula based on SCr of 0.62 mg/dL). Liver Function Tests:  Recent Labs Lab 01/04/16 1720  AST 15  ALT 11*  ALKPHOS 98  BILITOT 0.7  PROT 8.5*  ALBUMIN 4.4   No results for input(s): LIPASE, AMYLASE in the last 168 hours. No results for input(s): AMMONIA in the last 168 hours. Coagulation Profile: No results for input(s): INR, PROTIME in the last 168 hours. Cardiac Enzymes: No results for input(s): CKTOTAL, CKMB, CKMBINDEX, TROPONINI in the last 168 hours. BNP (last 3 results) No results for input(s): PROBNP in the last 8760 hours. HbA1C: No results for input(s): HGBA1C in the last 72 hours. CBG: No results for input(s): GLUCAP in the last 168 hours. Lipid Profile: No results for input(s): CHOL, HDL, LDLCALC, TRIG, CHOLHDL, LDLDIRECT in the last 72 hours. Thyroid Function  Tests: No results for input(s): TSH, T4TOTAL, FREET4, T3FREE, THYROIDAB in the last 72 hours. Anemia Panel: No results for input(s): VITAMINB12, FOLATE, FERRITIN, TIBC, IRON, RETICCTPCT in the last 72 hours. Urine analysis:    Component Value Date/Time   COLORURINE AMBER (A) 01/04/2016 1720   APPEARANCEUR CLEAR 01/04/2016 1720   LABSPEC 1.034 (H) 01/04/2016 1720   LABSPEC 1.010 07/15/2015 0944   PHURINE 6.5 01/04/2016 1720   GLUCOSEU NEGATIVE 01/04/2016 1720   GLUCOSEU Negative 07/15/2015 0944   HGBUR NEGATIVE 01/04/2016 1720   BILIRUBINUR SMALL (A) 01/04/2016 1720   BILIRUBINUR Negative 07/15/2015 0944   KETONESUR NEGATIVE 01/04/2016 1720   PROTEINUR NEGATIVE 01/04/2016 1720   UROBILINOGEN 0.2 07/15/2015 0944   NITRITE NEGATIVE 01/04/2016 1720   LEUKOCYTESUR SMALL (A) 01/04/2016 1720   LEUKOCYTESUR Negative 07/15/2015 0944   Sepsis Labs: @LABRCNTIP (procalcitonin:4,lacticidven:4)  ) Recent Results (from the past 240 hour(s))  Urine culture     Status: None   Collection Time: 01/04/16  5:20 PM  Result  Value Ref Range Status   Specimen Description URINE, CLEAN CATCH  Final   Special Requests NONE  Final   Culture NO GROWTH Performed at [facility]   Final   Report Status 01/05/2016 FINAL  Final  Culture, blood (Routine x 2)     Status: None (Preliminary result)   Collection Time: 01/04/16  5:29 PM  Result Value Ref Range Status   Specimen Description LEFT ANTECUBITAL  Final   Special Requests BOTTLES DRAWN AEROBIC AND ANAEROBIC 5CC  Final   Culture   Final    NO GROWTH 3 DAYS Performed at [facility]    Report Status PENDING  Incomplete  Culture, blood (Routine x 2)     Status: None (Preliminary result)   Collection Time: 01/04/16  5:44 PM  Result Value Ref Range Status   Specimen Description BLOOD LEFT FOREARM  Final   Special Requests BOTTLES DRAWN AEROBIC AND ANAEROBIC 5CC  Final   Culture   Final    NO GROWTH 3 DAYS Performed at [facility]    Report Status PENDING  Incomplete     Radiology Studies: No results found.  Scheduled Meds: . aspirin  81 mg Oral Daily  . atorvastatin  40 mg Oral q1800  . clopidogrel  75 mg Oral Q breakfast  . divalproex  500 mg Oral Daily  . divalproex  750 mg Oral QHS  . fluticasone  2 spray Each Nare QHS  . gabapentin  200 mg Oral BID  . lamoTRIgine  100 mg Oral BID  . levothyroxine  100 mcg Oral QAC breakfast  . mupirocin ointment   Topical BID  . pantoprazole  40 mg Oral Daily  . polyethylene glycol  17 g Oral BID  . rOPINIRole  1.5 mg Oral QHS  . vancomycin  750 mg Intravenous Q12H   Continuous Infusions:    LOS: 3 days   Time spent: 25 minutes.  Julie Gather, MD [facility] Pager [phone]  If 7PM-7AM, please contact night-coverage [email or web address] Password [email or web address] 01/07/2016, 12:14 PM

## 2016-01-08 ENCOUNTER — Inpatient Hospital Stay (HOSPITAL_COMMUNITY): Payer: PPO

## 2016-01-08 DIAGNOSIS — I2510 Atherosclerotic heart disease of native coronary artery without angina pectoris: Secondary | ICD-10-CM

## 2016-01-08 DIAGNOSIS — A419 Sepsis, unspecified organism: Principal | ICD-10-CM

## 2016-01-08 DIAGNOSIS — Z853 Personal history of malignant neoplasm of breast: Secondary | ICD-10-CM

## 2016-01-08 DIAGNOSIS — N6310 Unspecified lump in the right breast, unspecified quadrant: Secondary | ICD-10-CM | POA: Diagnosis not present

## 2016-01-08 DIAGNOSIS — N61 Mastitis without abscess: Secondary | ICD-10-CM

## 2016-01-08 DIAGNOSIS — Z01810 Encounter for preprocedural cardiovascular examination: Secondary | ICD-10-CM

## 2016-01-08 LAB — BASIC METABOLIC PANEL
ANION GAP: 8 (ref 5–15)
BUN: 15 mg/dL (ref 6–20)
CALCIUM: 9.1 mg/dL (ref 8.9–10.3)
CO2: 27 mmol/L (ref 22–32)
Chloride: 104 mmol/L (ref 101–111)
Creatinine, Ser: 0.81 mg/dL (ref 0.44–1.00)
GFR calc Af Amer: >60 mL/min (ref >60)
GLUCOSE: 96 mg/dL (ref 65–99)
POTASSIUM: 4.4 mmol/L (ref 3.5–5.1)
Sodium: 139 mmol/L (ref 135–145)

## 2016-01-08 LAB — CBC
HEMATOCRIT: 26.2 % — AB (ref 36.0–46.0)
HEMOGLOBIN: 8.2 g/dL — AB (ref 12.0–15.0)
MCH: 32.2 pg (ref 26.0–34.0)
MCHC: 31.3 g/dL (ref 30.0–36.0)
MCV: 102.7 fL — ABNORMAL HIGH (ref 78.0–100.0)
Platelets: 60 10*3/uL — ABNORMAL LOW (ref 150–400)
RBC: 2.55 MIL/uL — ABNORMAL LOW (ref 3.87–5.11)
RDW: 15.7 % — AB (ref 11.5–15.5)
WBC: 5.4 10*3/uL (ref 4.0–10.5)

## 2016-01-08 NOTE — Progress Notes (Signed)
  Subjective: Pt thinks redness may be worse and have streaking.  No increase in pain.    Objective: Vital signs in last 24 hours: Temp:  [98.2 F (36.8 C)-98.9 F (37.2 C)] 98.2 F (36.8 C) (11/19 [id number]) Pulse Rate:  [76-92] 76 (11/19 0613) Resp:  [18] 18 (11/19 0613) BP: (96-123)/(67-73) 106/68 (11/19 [id number]) SpO2:  [95 %-99 %] 99 % (11/19 0613) Last BM Date: 01/07/16  Intake/Output from previous day: 11/18 0701 - 11/19 0700 In: 300 [P.O.:300] Out: -  Intake/Output this shift: No intake/output data recorded.  General appearance: alert, cooperative and no distress Resp: breathing comfortably Breasts: minimal change since last night.  Continues to have necrotic superficial wound and around a cm of surrounding warmth and erythema.  No fluctuance  Lab Results:   Recent Labs  01/07/16 0651 01/08/16 0524  WBC 7.2 5.4  HGB 7.9* 8.2*  HCT 25.4* 26.2*  PLT 53* 60*   BMET  Recent Labs  01/07/16 0651 01/08/16 0524  NA 136 139  K 4.0 4.4  CL 102 104  CO2 27 27  GLUCOSE 117* 96  BUN 12 15  CREATININE 0.62 0.81  CALCIUM 9.1 9.1   PT/INR No results for input(s): LABPROT, INR in the last 72 hours. ABG No results for input(s): PHART, HCO3 in the last 72 hours.  Invalid input(s): PCO2, PO2  Studies/Results: No results found.  Anti-infectives: Anti-infectives    Start     Dose/Rate Route Frequency Ordered Stop   01/05/16 0600  vancomycin (VANCOCIN) IVPB 750 mg/150 ml premix     750 mg 150 mL/hr over 60 Minutes Intravenous Every 12 hours 01/04/16 1755     01/04/16 1830  vancomycin (VANCOCIN) 1,250 mg in sodium chloride 0.9 % 250 mL IVPB     1,250 mg 166.7 mL/hr over 90 Minutes Intravenous  Once 01/04/16 1755 01/04/16 2100      Assessment/Plan: s/p * No surgery found *  Right breast wound/cellulitis  IV antibiotics  Ultrasound this AM D/w cardiology this AM. Looking like probably will need debridement, but difficult situation given thrombocytopenia, non  functioning platelets from plavix/aspirin, and recent stent placement.   Risky no matter whether continued non op therapy or operative therapy employed.     LOS: 4 days    [facility] 01/08/2016

## 2016-01-08 NOTE — Progress Notes (Signed)
PROGRESS NOTE  [name]  [id number] DOB: 1955-08-24 DOA: 01/04/2016 PCP: Betty [name], MD  Outpatient Specialists: Dr. Alen Blew, oncology  Brief Narrative: [name] is a 60 y.o. female with a history of ovarian CA with peritoneal carcinomatosis Dx 2014 on chemotherapy, remote right breast CA s/p lumpectomy, chemo, radiation 1989 and recent Dx left breast CA. Also has CAD s/p DES in Sept 2017. She presented with general malaise, worsening redness, swelling, tenderness on right chest near site of lumpectomy in 1989. These symptoms failed to improve with CTX x1 and bactrim prescribed by PCP. On arrival temperature was 100.48F and erythematous right breast was noted. Attempted I&D was not productive of much purulence. IV antibiotics were started and she was admitted for abscess and cellulitis.Wound has continued to drain, erythema improving very slowly. Surgery was consulted for evaluation for debridement. Ultrasound showed solid 4.2 x 1.3 x 4.5 cm heterogeneous area that is indeterminate.  Assessment & Plan: Principal Problem:   Cellulitis of right breast Active Problems:   Hyperlipidemia   BREAST CANCER, HX OF   Malignant neoplasm of female breast ([place])   Primary peritoneal carcinomatosis ([place])   Coronary artery disease involving native heart without angina pectoris   GERD (gastroesophageal reflux disease)   Sepsis ([place])  Cellulitis and abscess of right breast: With mild sepsis now resolved but continuing with low grade temperature. Per Dr. Alen Blew, local recurrence of breast cancer >10 years following lumpectomy with negative mammograms is exceedingly unlikely.  - Continue vancomycin, bactroban applied BID - U/S today: indeterminate solid area (DDx includes infectious, inflammatory, postsurgical, malignancy).  - Surgery consulted for debridement. Difficult with dual antiplatelet therapy and thrombocytopenia.   Macrocytic anemia: Chronic due to chemotherapy. No signs of  bleeding. - Monitor CBC, if oxygen requirement continues and hgb drops, will transfuse for symptomatic anemia.   Peritoneal carcinomatosis and GYN malignancy:  - Undergoing chemo for ovarian and breast CA.  - Next chemo after Thanksgiving, will be held if infection not completely resolved.   CAD s/p DES in Sept 2017: No angina, low suspicion for ACS.  - Continue ASA, plavix, statin. - Holding ACE inhibitor and imdur due to hypotension - Appreciate cardiology input - Will check HbA1c due to h/o T2DM  Depression: Chronic, stable - Continue lamictal and depakote. No indication to check level.  Hypothyroidism: Last TSH 5.035 in Sept 2017.  - Continue synthroid - Recheck TSH recommended outside scope of current illness.  GERD: Chronic, stable - Continue protonix  Restless legs: Chronic, stable - Continue ropinirole  DVT prophylaxis: SCDs Code Status: Full Family Communication: Husband at bedside Disposition Plan: Continued IV abx +/- debridement.  Consultants:   Dr. Alen Blew, oncology  General surgery  Procedures:   I&D 11/15  Antimicrobials:  Vancomycin 11/15 >>   Subjective: Pain and redness improved, still draining, unroofed by ultrasound this AM. No chest pain, dyspnea or other complaints.   Objective: Vitals:   01/07/16 0527 01/07/16 1328 01/07/16 2145 01/08/16 0613  BP: (!) 104/49 123/73 96/67 106/68  Pulse: 77 92 86 76  Resp: 18 18 18 18   Temp: 99.8 F (37.7 C) 98.9 F (37.2 C) 98.8 F (37.1 C) 98.2 F (36.8 C)  TempSrc: Oral Oral Oral Oral  SpO2: 97% 97% 95% 99%  Weight:      Height:        Intake/Output Summary (Last 24 hours) at 01/08/16 1212 Last data filed at 01/07/16 1300  Gross per 24 hour  Intake  60 ml  Output                0 ml  Net               60 ml   Filed Weights   01/04/16 1849 01/04/16 2130  Weight: 73 kg (161 lb) 72.8 kg (160 lb 7.9 oz)    Examination: General exam: 60 y.o. female in no distress Respiratory  system: Non-labored breathing room air. Clear to auscultation bilaterally.  Cardiovascular system: Regular rate and rhythm. No murmur, rub, or gallop. No JVD, and no pedal edema. Gastrointestinal system: Abdomen soft, non-tender, non-distended, with normoactive bowel sounds. No organomegaly or masses felt. Central nervous system: Alert and oriented. No focal neurological deficits. Extremities: Warm, no deformities Skin: Irregular erythema spreading from central raised draining abscess at 3 o'clock of right breast near sternum extending to right breast not involving nipple and partially across midline to the left. No adenopathy noted.  Psychiatry: Judgement and insight appear normal. Mood & affect appropriate.   Data Reviewed: I have personally reviewed following labs and imaging studies  CBC:  Recent Labs Lab 01/04/16 1720 01/05/16 0340 01/06/16 0811 01/07/16 0651 01/08/16 0524  WBC 11.5* 8.4 7.4 7.2 5.4  NEUTROABS 8.6*  --   --   --   --   HGB 9.7* 8.0* 7.9* 7.9* 8.2*  HCT 31.7* 26.2* 25.3* 25.4* 26.2*  MCV 104.6* 102.3* 104.1* 103.3* 102.7*  PLT 83* 58* 54* 53* 60*   Basic Metabolic Panel:  Recent Labs Lab 01/04/16 1720 01/05/16 0340 01/06/16 0811 01/07/16 0651 01/08/16 0524  NA 137 139 138 136 139  K 4.8 4.8 4.8 4.0 4.4  CL 102 107 108 102 104  CO2 28 26 25 27 27   GLUCOSE 93 93 94 117* 96  BUN 12 10 9 12 15   CREATININE 0.74 0.66 0.61 0.62 0.81  CALCIUM 9.6 8.9 9.0 9.1 9.1   GFR: Estimated Creatinine Clearance: 69.1 mL/min (by C-G formula based on SCr of 0.81 mg/dL). Liver Function Tests:  Recent Labs Lab 01/04/16 1720  AST 15  ALT 11*  ALKPHOS 98  BILITOT 0.7  PROT 8.5*  ALBUMIN 4.4   No results for input(s): LIPASE, AMYLASE in the last 168 hours. No results for input(s): AMMONIA in the last 168 hours. Coagulation Profile: No results for input(s): INR, PROTIME in the last 168 hours. Cardiac Enzymes: No results for input(s): CKTOTAL, CKMB, CKMBINDEX,  TROPONINI in the last 168 hours. BNP (last 3 results) No results for input(s): PROBNP in the last 8760 hours. HbA1C: No results for input(s): HGBA1C in the last 72 hours. CBG: No results for input(s): GLUCAP in the last 168 hours. Lipid Profile: No results for input(s): CHOL, HDL, LDLCALC, TRIG, CHOLHDL, LDLDIRECT in the last 72 hours. Thyroid Function Tests: No results for input(s): TSH, T4TOTAL, FREET4, T3FREE, THYROIDAB in the last 72 hours. Anemia Panel: No results for input(s): VITAMINB12, FOLATE, FERRITIN, TIBC, IRON, RETICCTPCT in the last 72 hours. Urine analysis:    Component Value Date/Time   COLORURINE AMBER (A) 01/04/2016 1720   APPEARANCEUR CLEAR 01/04/2016 1720   LABSPEC 1.034 (H) 01/04/2016 1720   LABSPEC 1.010 07/15/2015 0944   PHURINE 6.5 01/04/2016 1720   GLUCOSEU NEGATIVE 01/04/2016 1720   GLUCOSEU Negative 07/15/2015 0944   HGBUR NEGATIVE 01/04/2016 1720   BILIRUBINUR SMALL (A) 01/04/2016 1720   BILIRUBINUR Negative 07/15/2015 0944   KETONESUR NEGATIVE 01/04/2016 1720   PROTEINUR NEGATIVE 01/04/2016 1720   UROBILINOGEN 0.2  07/15/2015 0944   NITRITE NEGATIVE 01/04/2016 1720   LEUKOCYTESUR SMALL (A) 01/04/2016 1720   LEUKOCYTESUR Negative 07/15/2015 0944   Sepsis Labs: @LABRCNTIP (procalcitonin:4,lacticidven:4)  ) Recent Results (from the past 240 hour(s))  Urine culture     Status: None   Collection Time: 01/04/16  5:20 PM  Result Value Ref Range Status   Specimen Description URINE, CLEAN CATCH  Final   Special Requests NONE  Final   Culture NO GROWTH Performed at [facility]   Final   Report Status 01/05/2016 FINAL  Final  Culture, blood (Routine x 2)     Status: None (Preliminary result)   Collection Time: 01/04/16  5:29 PM  Result Value Ref Range Status   Specimen Description LEFT ANTECUBITAL  Final   Special Requests BOTTLES DRAWN AEROBIC AND ANAEROBIC 5CC  Final   Culture   Final    NO GROWTH 3 DAYS Performed at [facility]    Report Status PENDING  Incomplete  Culture, blood (Routine x 2)     Status: None (Preliminary result)   Collection Time: 01/04/16  5:44 PM  Result Value Ref Range Status   Specimen Description BLOOD LEFT FOREARM  Final   Special Requests BOTTLES DRAWN AEROBIC AND ANAEROBIC 5CC  Final   Culture   Final    NO GROWTH 3 DAYS Performed at [facility]    Report Status PENDING  Incomplete     Radiology Studies: No results found.  Scheduled Meds: . aspirin  81 mg Oral Daily  . atorvastatin  40 mg Oral q1800  . clopidogrel  75 mg Oral Q breakfast  . divalproex  500 mg Oral Daily  . divalproex  750 mg Oral QHS  . fluticasone  2 spray Each Nare QHS  . gabapentin  200 mg Oral BID  . lamoTRIgine  100 mg Oral BID  . levothyroxine  100 mcg Oral QAC breakfast  . mupirocin ointment   Topical BID  . pantoprazole  40 mg Oral Daily  . polyethylene glycol  17 g Oral BID  . rOPINIRole  1.5 mg Oral QHS  . vancomycin  750 mg Intravenous Q12H   Continuous Infusions:    LOS: 4 days   Time spent: 25 minutes.  Vance Gather, MD [facility] Pager [phone]  If 7PM-7AM, please contact night-coverage [email or web address] Password [email or web address] 01/08/2016, 12:12 PM

## 2016-01-08 NOTE — Consult Note (Signed)
CONSULTATION NOTE  Reason for Consult: Preoperative cardiac risk assessment   Requesting Physician: Dr. Bonner Puna / Dr. Barry Dienes  Cardiologist: Dr. Harrington Challenger  HPI: This is a 60 y.o. female with a past medical history significant for coronary artery disease with non-ST elevation MI, admitted between 9/5 and 10/27/2015. Left heart cath demonstrated single-vessel coronary disease with a 99% distal LAD stenosis treated with a Xience drug-eluting stent. LVEF at the time was 35-40% however follow-up echo showed improvement in LV function to 60-65% with mild diastolic dysfunction. After discharge she continued to have chest discomfort and was seen by Richardson Dopp, PA-C, who recommended a repeat cardiac catheterization. That was performed on 11/08/2015, which revealed a patent LAD stent and nonobstructive coronary disease elsewhere. She was recently seen by Dr. Harrington Challenger in the office in October and found to have intermittent arm discomfort. This was seemingly relieved by nitroglycerin. Her Imdur was increased to 30 mg daily based on ongoing chest pain, however she had dizziness and low BP at cardiac rehab and the dose was decreased back down to 15 mg daily. She also has a history of right breast cancer of the right breast dating back to 1989 - she continues on preventive chemotherapy for this. Dr. Harrington Challenger commented on 12/12/2015 that she would require "staying on aspirin and Plavix for at least 6 months, preferably one year before stopping", in response to a request for clearance for root canal. She now presents with fever and was found recently to have left breast CA in 06/2015 s/p lumpectomy, chemo and radiation. She developed redness and swelling at her prior right lumpectomy site and was placed on antibiotics, but fevers continued and she was admitted. See by general surgery and though to have right breast cellulitis and probable MRSA infection of the skin. Cardiology is asked to comment regarding management of antiplatelet  agents and risk for surgery.  PMHx:  Past Medical History:  Diagnosis Date  . ADD (attention deficit disorder)   . Allergy    seasonal  . Anxiety   . Bipolar disorder ([place])   . Breast cancer ([place]) 1989   right breast  . Cancer ([place])    Omentum  . Coronary artery disease   . Depression   . Diabetes mellitus    pt denies DM noe meds  . Hematochezia   . History of echocardiogram    a. Limited Echo 9/17: EF 60-65%, no RWMA, Gr 1 DD, trivial AI, trivial MR, GLS -12% (likely underestimated), no pericardial effusion  . Hyperlipidemia   . Hypothyroidism   . Maintenance chemotherapy    Pt has chemo every 3 weeks (on Friday)  . NSTEMI (non-ST elevated myocardial infarction) ([place]) 10/26/2015  . Osteopenia 03/2009   t score -2.1 FRAX 4.6/0.4  . Premature menopause   . Sleep apnea    mild   Past Surgical History:  Procedure Laterality Date  . BREAST SURGERY  1989   RIGHT LUMPECTOMY, RADIATION AND CHEMO  . CARDIAC CATHETERIZATION N/A 10/26/2015   Procedure: Left Heart Cath and Coronary Angiography;  Surgeon: Wellington Hampshire, MD;  Location: [facility];  Service: Cardiovascular;  Laterality: N/A;  . CARDIAC CATHETERIZATION N/A 10/26/2015   Procedure: Coronary Stent Intervention;  Surgeon: Wellington Hampshire, MD;  Location: [facility];  Service: Cardiovascular;  Laterality: N/A;  . CARDIAC CATHETERIZATION N/A 11/08/2015   Procedure: Left Heart Cath and Coronary Angiography;  Surgeon: Jolaine Artist, MD;  Location: [facility];  Service:  Cardiovascular;  Laterality: N/A;  . drug eluting stent  10/26/2015  . FOOT SURGERY  2013   BILATERAL   . HYSTEROSCOPY  2011   [place]  . PELVIC LAPAROSCOPY/ Hysteroscopy  1996    FAMHx: Family History  Problem Relation Age of Onset  . Breast cancer Maternal Aunt 29  . Diabetes Maternal Grandmother   . Heart disease Maternal Grandmother   . Heart disease Maternal Grandfather   . Hypertension Paternal Grandfather   . Heart  Problems Paternal Grandfather   . Breast cancer Maternal Aunt     dx. early 41s; had negative GT approx 10 years ago  . Breast cancer Maternal Aunt     dx. 32s with recurrence  . Allergies Father   . Heart disease Mother   . Other Mother     hx of hysterectomy   . Liver cancer Cousin 9    +EtOH  . Cancer Cousin     paternal 1st cousin, once-removed dx. NOS cancer (maybe ovarian) in late 30s-40s  . Cancer Cousin     female paternal 2nd cousin d. of NOS cancer in her 20s-early 55s  . Ovarian cancer Neg Hx   . Colon cancer Neg Hx     SOCHx:  reports that she has never smoked. She has never used smokeless tobacco. She reports that she drinks alcohol. She reports that she does not use drugs.  ALLERGIES: Allergies  Allergen Reactions  . Doxil [Doxorubicin Hcl Liposomal] Anaphylaxis    1st Doxil.   . Pollen Extract Other (See Comments)    Pollen and grass causes a lot sneezing    ROS: Pertinent items noted in HPI and remainder of comprehensive ROS otherwise negative.  HOME MEDICATIONS: Current Facility-Administered Medications on File Prior to Encounter  Medication Dose Route Frequency Provider Last Rate Last Dose  . heparin lock flush 100 unit/mL  500 Units Intravenous Once Wyatt Portela, MD      . sodium chloride flush (NS) 0.9 % injection 10 mL  10 mL Intravenous PRN Wyatt Portela, MD       Current Outpatient Prescriptions on File Prior to Encounter  Medication Sig Dispense Refill  . Acetylcarnitine HCl (ACETYL L-CARNITINE PO) Take 2,000 mg by mouth at bedtime.    . ALPRAZolam (XANAX) 0.5 MG tablet Take 0.5 mg by mouth 2 (two) times daily as needed for anxiety.    Marland Kitchen aspirin 81 MG chewable tablet Chew 1 tablet (81 mg total) by mouth daily. 30 tablet 0  . atorvastatin (LIPITOR) 40 MG tablet Take 1 tablet (40 mg total) by mouth daily at 6 PM. 30 tablet 0  . carvedilol (COREG) 3.125 MG tablet Take 0.5 tablets (1.56 mg total) by mouth 2 (two) times daily with a meal. 30 tablet  3  . clopidogrel (PLAVIX) 75 MG tablet Take 1 tablet (75 mg total) by mouth daily with breakfast. 30 tablet 0  . divalproex (DEPAKOTE) 250 MG DR tablet Take by mouth. Take 565ms in the morning and 753m at night    . fluticasone (FLONASE) 50 MCG/ACT nasal spray INHALE 2 SPRAYS INTO EACH NOSTRIL EVERY NIGHT  11  . gabapentin (NEURONTIN) 100 MG capsule Take 1 capsule (100 mg total) by mouth 3 (three) times daily. (Patient taking differently: Take 200 mg by mouth 2 (two) times daily. ) 90 capsule 2  . isosorbide mononitrate (IMDUR) 30 MG 24 hr tablet Take 1 tablet (30 mg total) by mouth daily. (Patient taking differently: Take 15 mg by  mouth daily. ) 90 tablet 3  . lamoTRIgine (LAMICTAL) 100 MG tablet Take 100 mg by mouth 2 (two) times daily.    Marland Kitchen levothyroxine (SYNTHROID, LEVOTHROID) 100 MCG tablet Take 100 mcg by mouth daily before breakfast.     . lidocaine-prilocaine (EMLA) cream Apply topically as needed. Apply to port with every chemotherapy. 30 g 0  . lisinopril (PRINIVIL,ZESTRIL) 2.5 MG tablet Take 1 tablet (2.5 mg total) by mouth daily. 30 tablet 0  . loperamide (IMODIUM A-D) 2 MG tablet Take 2 mg by mouth as needed for diarrhea or loose stools.    Marland Kitchen loratadine (CLARITIN) 10 MG tablet Take 10 mg by mouth daily as needed for allergies (with chemo).    . nitroGLYCERIN (NITROSTAT) 0.4 MG SL tablet Place 1 tablet (0.4 mg total) under the tongue every 5 (five) minutes as needed for chest pain. 25 tablet 3  . ondansetron (ZOFRAN) 8 MG tablet TAKE 1 TABLET BY MOUTH EVERY 8 HOURS AS NEEDED FOR NAUSEA AND VOMITING 30 tablet 2  . pantoprazole (PROTONIX) 40 MG tablet Take 1 tablet (40 mg total) by mouth daily. 30 tablet 11  . rOPINIRole (REQUIP) 0.5 MG tablet Take 1.5 mg by mouth at bedtime.     . sulfamethoxazole-trimethoprim (BACTRIM DS,SEPTRA DS) 800-160 MG tablet Take 1 tablet by mouth 2 (two) times daily. 14 tablet 0  . HYDROcodone-acetaminophen (NORCO/VICODIN) 5-325 MG tablet Take 1 tablet by  mouth every 8 (eight) hours as needed for moderate pain. 15 tablet 0    HOSPITAL MEDICATIONS: Prior to Admission:  Prescriptions Prior to Admission  Medication Sig Dispense Refill Last Dose  . Acetylcarnitine HCl (ACETYL L-CARNITINE PO) Take 2,000 mg by mouth at bedtime.   01/03/2016 at Unknown time  . ALPRAZolam (XANAX) 0.5 MG tablet Take 0.5 mg by mouth 2 (two) times daily as needed for anxiety.   01/03/2016 at Unknown time  . aspirin 81 MG chewable tablet Chew 1 tablet (81 mg total) by mouth daily. 30 tablet 0 01/04/2016 at Unknown time  . atorvastatin (LIPITOR) 40 MG tablet Take 1 tablet (40 mg total) by mouth daily at 6 PM. 30 tablet 0 01/03/2016 at Unknown time  . carvedilol (COREG) 3.125 MG tablet Take 0.5 tablets (1.56 mg total) by mouth 2 (two) times daily with a meal. 30 tablet 3 01/04/2016 at 1100  . clopidogrel (PLAVIX) 75 MG tablet Take 1 tablet (75 mg total) by mouth daily with breakfast. 30 tablet 0 01/04/2016 at Unknown time  . divalproex (DEPAKOTE) 250 MG DR tablet Take by mouth. Take 526ms in the morning and 7574m at night   01/04/2016 at Unknown time  . fluticasone (FLONASE) 50 MCG/ACT nasal spray INHALE 2 SPRAYS INTO EACH NOSTRIL EVERY NIGHT  11 01/03/2016 at Unknown time  . gabapentin (NEURONTIN) 100 MG capsule Take 1 capsule (100 mg total) by mouth 3 (three) times daily. (Patient taking differently: Take 200 mg by mouth 2 (two) times daily. ) 90 capsule 2 01/04/2016 at 1100  . isosorbide mononitrate (IMDUR) 30 MG 24 hr tablet Take 1 tablet (30 mg total) by mouth daily. (Patient taking differently: Take 15 mg by mouth daily. ) 90 tablet 3 01/04/2016 at Unknown time  . lamoTRIgine (LAMICTAL) 100 MG tablet Take 100 mg by mouth 2 (two) times daily.   01/04/2016 at 1100  . levothyroxine (SYNTHROID, LEVOTHROID) 100 MCG tablet Take 100 mcg by mouth daily before breakfast.    01/04/2016 at Unknown time  . lidocaine-prilocaine (EMLA) cream Apply topically as  needed. Apply to port  with every chemotherapy. 30 g 0 12/22/2015  . lisinopril (PRINIVIL,ZESTRIL) 2.5 MG tablet Take 1 tablet (2.5 mg total) by mouth daily. 30 tablet 0 01/04/2016 at Unknown time  . loperamide (IMODIUM A-D) 2 MG tablet Take 2 mg by mouth as needed for diarrhea or loose stools.   Unknown  . loratadine (CLARITIN) 10 MG tablet Take 10 mg by mouth daily as needed for allergies (with chemo).   Past Month at Unknown time  . nitroGLYCERIN (NITROSTAT) 0.4 MG SL tablet Place 1 tablet (0.4 mg total) under the tongue every 5 (five) minutes as needed for chest pain. 25 tablet 3 Past Month at Unknown time  . ondansetron (ZOFRAN) 8 MG tablet TAKE 1 TABLET BY MOUTH EVERY 8 HOURS AS NEEDED FOR NAUSEA AND VOMITING 30 tablet 2 01/04/2016 at 0100  . pantoprazole (PROTONIX) 40 MG tablet Take 1 tablet (40 mg total) by mouth daily. 30 tablet 11 01/04/2016 at Unknown time  . rOPINIRole (REQUIP) 0.5 MG tablet Take 1.5 mg by mouth at bedtime.    01/03/2016 at Unknown time  . sulfamethoxazole-trimethoprim (BACTRIM DS,SEPTRA DS) 800-160 MG tablet Take 1 tablet by mouth 2 (two) times daily. 14 tablet 0 01/04/2016 at Am  . HYDROcodone-acetaminophen (NORCO/VICODIN) 5-325 MG tablet Take 1 tablet by mouth every 8 (eight) hours as needed for moderate pain. 15 tablet 0 Not started yet    VITALS: Blood pressure 106/68, pulse 76, temperature 98.2 F (36.8 C), temperature source Oral, resp. rate 18, height _0  (1.549 m), weight 160 lb 7.9 oz (72.8 kg), SpO2 99 %.  PHYSICAL EXAM: General appearance: alert and no distress Neck: no carotid bruit and no JVD Lungs: clear to auscultation bilaterally Heart: regular rate and rhythm Abdomen: soft, non-tender; bowel sounds normal; no masses,  no organomegaly Extremities: extremities normal, atraumatic, no cyanosis or edema Pulses: 2+ and symmetric Skin: Right inner upper quadrant breast cellulitis Neurologic: Mental status: Alert, oriented, thought content appropriate Psych:  Pleasant  LABS: Results for orders placed or performed during the hospital encounter of 01/04/16 (from the past 48 hour(s))  Vancomycin, trough     Status: Abnormal   Collection Time: 01/06/16  4:49 PM  Result Value Ref Range   Vancomycin Tr 12 (L) 15 - 20 ug/mL  CBC     Status: Abnormal   Collection Time: 01/07/16  6:51 AM  Result Value Ref Range   WBC 7.2 4.0 - 10.5 K/uL   RBC 2.46 (L) 3.87 - 5.11 MIL/uL   Hemoglobin 7.9 (L) 12.0 - 15.0 g/dL   HCT 25.4 (L) 36.0 - 46.0 %   MCV 103.3 (H) 78.0 - 100.0 fL   MCH 32.1 26.0 - 34.0 pg   MCHC 31.1 30.0 - 36.0 g/dL   RDW 15.9 (H) 11.5 - 15.5 %   Platelets 53 (L) 150 - 400 K/uL    Comment: CONSISTENT WITH PREVIOUS RESULT  Basic metabolic panel     Status: Abnormal   Collection Time: 01/07/16  6:51 AM  Result Value Ref Range   Sodium 136 135 - 145 mmol/L   Potassium 4.0 3.5 - 5.1 mmol/L   Chloride 102 101 - 111 mmol/L   CO2 27 22 - 32 mmol/L   Glucose, Bld 117 (H) 65 - 99 mg/dL   BUN 12 6 - 20 mg/dL   Creatinine, Ser 0.62 0.44 - 1.00 mg/dL   Calcium 9.1 8.9 - 10.3 mg/dL   GFR calc non Af Amer >60 >60 mL/min  GFR calc Af Amer >60 >60 mL/min    Comment: (NOTE) The eGFR has been calculated using the CKD EPI equation. This calculation has not been validated in all clinical situations. eGFR's persistently <60 mL/min signify possible Chronic Kidney Disease.    Anion gap 7 5 - 15  Basic metabolic panel     Status: None   Collection Time: 01/08/16  5:24 AM  Result Value Ref Range   Sodium 139 135 - 145 mmol/L   Potassium 4.4 3.5 - 5.1 mmol/L   Chloride 104 101 - 111 mmol/L   CO2 27 22 - 32 mmol/L   Glucose, Bld 96 65 - 99 mg/dL   BUN 15 6 - 20 mg/dL   Creatinine, Ser 0.81 0.44 - 1.00 mg/dL   Calcium 9.1 8.9 - 10.3 mg/dL   GFR calc non Af Amer >60 >60 mL/min   GFR calc Af Amer >60 >60 mL/min    Comment: (NOTE) The eGFR has been calculated using the CKD EPI equation. This calculation has not been validated in all clinical  situations. eGFR's persistently <60 mL/min signify possible Chronic Kidney Disease.    Anion gap 8 5 - 15  CBC     Status: Abnormal   Collection Time: 01/08/16  5:24 AM  Result Value Ref Range   WBC 5.4 4.0 - 10.5 K/uL   RBC 2.55 (L) 3.87 - 5.11 MIL/uL   Hemoglobin 8.2 (L) 12.0 - 15.0 g/dL   HCT 26.2 (L) 36.0 - 46.0 %   MCV 102.7 (H) 78.0 - 100.0 fL   MCH 32.2 26.0 - 34.0 pg   MCHC 31.3 30.0 - 36.0 g/dL   RDW 15.7 (H) 11.5 - 15.5 %   Platelets 60 (L) 150 - 400 K/uL    Comment: CONSISTENT WITH PREVIOUS RESULT    IMAGING: US Breast Ltd Uni Right Inc Axilla  Result Date: 01/08/2016 CLINICAL DATA:  60 year old female with right breast pain, redness and swelling at right lumpectomy site, which was performed 28 years ago. History of left mastectomy for left breast cancer in 2017. EXAM: ULTRASOUND OF THE RIGHT BREAST COMPARISON:  Previous exam(s). FINDINGS: Targeted ultrasound is performed, showing a 4.2 x 1.3 x 4.5 solid heterogeneous hypoechoic and hyperechoic area at the 3 o'clock position of the right breast 9 cm from nipple at the site of previous lumpectomy. No focal fluid collections/abscess identified. IMPRESSION: No evidence of right breast abscess. Solid 4.2 x 1.3 x 4.5 cm heterogeneous area within the inner left breast, at the lumpectomy site. This is indeterminate and may represent postsurgical changes, infectious/ inflammatory changes or malignancy. RECOMMENDATION: Clinical followup. Consider tissue sampling and/or mammograms for further evaluation as clinically indicated. Electronically Signed   By: Margarette Canada M.D.   On: 01/08/2016 12:20    HOSPITAL DIAGNOSES: Principal Problem:   Cellulitis of right breast Active Problems:   Hyperlipidemia   BREAST CANCER, HX OF   Malignant neoplasm of female breast ([place])   Primary peritoneal carcinomatosis ([place])   Coronary artery disease involving native heart without angina pectoris   GERD (gastroesophageal reflux disease)   Sepsis  ([place])   IMPRESSION: 1. Intermediate risk for local wound debridement 2. Recent drug-eluting stent (10/26/15) on aspirin and Plavix  RECOMMENDATION: 1.  Mrs. Felber has what appears to be cellulitis of the right breast. Breast ultrasound today showed a solid heterogeneous area in the inner left breast which was not clearly an abscess. This is complicated by recent drug-eluting stent placement in September 2017. She  CANNOT discontinue aspirin or Plavix for surgical debridement without a prohibitively high risk of in-stent thrombosis. She reports some improvement already with medical therapy and superficial debridement. I would pursue this strategy unless surgery is absolutely necessary as she is likely going to be at increased bleeding risk on dual antiplatelet therapy.  Cardiology will be available as needed. Please call with questions.  Time Spent Directly with Patient: 35 minutes  Pixie Casino, MD, [facility] Attending Cardiologist [place] 01/08/2016, 2:31 PM

## 2016-01-09 ENCOUNTER — Encounter: Payer: Self-pay | Admitting: Oncology

## 2016-01-09 ENCOUNTER — Ambulatory Visit: Payer: PPO | Admitting: Family Medicine

## 2016-01-09 ENCOUNTER — Encounter (HOSPITAL_COMMUNITY): Payer: PPO

## 2016-01-09 ENCOUNTER — Ambulatory Visit (HOSPITAL_COMMUNITY): Payer: PPO

## 2016-01-09 DIAGNOSIS — C50912 Malignant neoplasm of unspecified site of left female breast: Secondary | ICD-10-CM | POA: Diagnosis not present

## 2016-01-09 DIAGNOSIS — D696 Thrombocytopenia, unspecified: Secondary | ICD-10-CM

## 2016-01-09 DIAGNOSIS — C482 Malignant neoplasm of peritoneum, unspecified: Secondary | ICD-10-CM | POA: Diagnosis not present

## 2016-01-09 DIAGNOSIS — L03313 Cellulitis of chest wall: Secondary | ICD-10-CM | POA: Diagnosis not present

## 2016-01-09 DIAGNOSIS — N61 Mastitis without abscess: Secondary | ICD-10-CM | POA: Diagnosis not present

## 2016-01-09 LAB — BASIC METABOLIC PANEL
ANION GAP: 7 (ref 5–15)
BUN: 13 mg/dL (ref 6–20)
CO2: 27 mmol/L (ref 22–32)
Calcium: 9.2 mg/dL (ref 8.9–10.3)
Chloride: 104 mmol/L (ref 101–111)
Creatinine, Ser: 0.64 mg/dL (ref 0.44–1.00)
Glucose, Bld: 97 mg/dL (ref 65–99)
POTASSIUM: 4.5 mmol/L (ref 3.5–5.1)
SODIUM: 138 mmol/L (ref 135–145)

## 2016-01-09 LAB — CULTURE, BLOOD (ROUTINE X 2)
CULTURE: NO GROWTH
Culture: NO GROWTH

## 2016-01-09 LAB — CBC
HEMATOCRIT: 27.5 % — AB (ref 36.0–46.0)
HEMOGLOBIN: 8.4 g/dL — AB (ref 12.0–15.0)
MCH: 31.6 pg (ref 26.0–34.0)
MCHC: 30.5 g/dL (ref 30.0–36.0)
MCV: 103.4 fL — ABNORMAL HIGH (ref 78.0–100.0)
Platelets: 64 10*3/uL — ABNORMAL LOW (ref 150–400)
RBC: 2.66 MIL/uL — ABNORMAL LOW (ref 3.87–5.11)
RDW: 15.5 % (ref 11.5–15.5)
WBC: 5.1 10*3/uL (ref 4.0–10.5)

## 2016-01-09 LAB — HEMOGLOBIN A1C
HEMOGLOBIN A1C: 4.7 % — AB (ref 4.8–5.6)
MEAN PLASMA GLUCOSE: 88 mg/dL

## 2016-01-09 MED ORDER — SILVER NITRATE-POT NITRATE 75-25 % EX MISC
1.0000 | Freq: Once | CUTANEOUS | Status: DC
  Filled 2016-01-09: qty 1

## 2016-01-09 MED ORDER — LIDOCAINE HCL (PF) 2 % IJ SOLN
0.0000 mL | Freq: Once | INTRAMUSCULAR | Status: DC | PRN
  Filled 2016-01-09 (×2): qty 20

## 2016-01-09 MED ORDER — LIDOCAINE-EPINEPHRINE 1 %-1:100000 IJ SOLN
10.0000 mL | Freq: Once | INTRAMUSCULAR | Status: DC
Start: 2016-01-09 — End: 2016-01-11
  Filled 2016-01-09: qty 10

## 2016-01-09 MED ORDER — SILVER NITRATE-POT NITRATE 75-25 % EX MISC
10.0000 | Freq: Once | CUTANEOUS | Status: DC
  Filled 2016-01-09: qty 10

## 2016-01-09 NOTE — Progress Notes (Signed)
PROGRESS NOTE  [name]  [id number] DOB: Jul 21, 1955 DOA: 01/04/2016 PCP: Betty [name], MD  Outpatient Specialists: Dr. Alen Blew, oncology  Brief Narrative: [name] is a 60 y.o. female with a history of ovarian CA with peritoneal carcinomatosis Dx 2014 on chemotherapy, remote right breast CA s/p lumpectomy, chemo, radiation 1989 and recent Dx left breast CA. Also has CAD s/p DES in Sept 2017. She presented with general malaise, worsening redness, swelling, tenderness on right chest near site of lumpectomy in 1989. These symptoms failed to improve with CTX x1 and bactrim prescribed by PCP. On arrival temperature was 100.53F and erythematous right breast was noted. Attempted I&D was not productive of much purulence. IV antibiotics were started and she was admitted for abscess and cellulitis.Wound has continued to drain, erythema improving very slowly. Surgery was consulted for evaluation for debridement. Ultrasound showed solid 4.2 x 1.3 x 4.5 cm heterogeneous area that is indeterminate (postsurgical, inflammatory, malignancy, infectious). Erythema continues to improve.   Assessment & Plan: Principal Problem:   Cellulitis of right breast Active Problems:   Hyperlipidemia   BREAST CANCER, HX OF   Malignant neoplasm of female breast ([place])   Primary peritoneal carcinomatosis ([place])   Coronary artery disease involving native heart without angina pectoris   GERD (gastroesophageal reflux disease)   Sepsis ([place])  Cellulitis and abscess of right breast: With mild sepsis now resolved but continuing with low grade temperature. Per Dr. Alen Blew, local recurrence of breast cancer >10 years following lumpectomy with negative mammograms is exceedingly unlikely.  - Continue vancomycin, bactroban applied BID - U/S 11/19 showed indeterminate solid area (DDx includes infectious, inflammatory, postsurgical, malignancy).  - Surgery considering debridement. Cardiology recommending no interruption in  DAPT. Onc ok with prophylactic platelet transfusion if debridement is considered.   Macrocytic anemia: Chronic due to chemotherapy. No signs of bleeding. - Monitor CBC  Peritoneal carcinomatosis and GYN malignancy:  - Undergoing chemo for ovarian and breast CA.  - Next chemo after Thanksgiving, will be held if infection not completely resolved. -  - Dr. Hazeline Junker involvement is appreciated  CAD s/p DES in Sept 2017: No angina, low suspicion for ACS.  - Continue ASA, plavix, statin. - Holding ACE inhibitor and imdur due to hypotension - Appreciate cardiology input: continue DAPT  Depression: Chronic, stable - Continue lamictal and depakote. No indication to check level.  Hypothyroidism: Last TSH 5.035 in Sept 2017.  - Continue synthroid - Recheck TSH recommended outside scope of current illness.  GERD: Chronic, stable - Continue protonix  Restless legs: Chronic, stable - Continue ropinirole  DVT prophylaxis: SCDs Code Status: Full Family Communication: Husband at bedside. Contacted brother, who is a Garment/textile technologist, at family's request. Left HIPAA-compliant voice message.  Disposition Plan: Continued IV abx +/- debridement.  Consultants:   Dr. Alen Blew, oncology  General surgery  Dr. Debara Pickett, cardiology  Procedures:   I&D 11/15  Antimicrobials:  Vancomycin 11/15 >>   Subjective: Pain and redness improved, still draining, unroofed. No chest pain, dyspnea or other complaints.   Objective: Vitals:   01/08/16 0613 01/08/16 1500 01/08/16 2124 01/09/16 0615  BP: 106/68 (!) 109/59 110/64 (!) 128/56  Pulse: 76 79 81 69  Resp: 18 19 18 18   Temp: 98.2 F (36.8 C) 98.1 F (36.7 C) 98.8 F (37.1 C) 97.7 F (36.5 C)  TempSrc: Oral Oral Oral Oral  SpO2: 99% 94% 96% 97%  Weight:      Height:        Intake/Output Summary (Last 24  hours) at 01/09/16 1458 Last data filed at 01/09/16 0827  Gross per 24 hour  Intake             1100 ml  Output                0 ml  Net              1100 ml   Filed Weights   01/04/16 1849 01/04/16 2130  Weight: 73 kg (161 lb) 72.8 kg (160 lb 7.9 oz)    Examination: General exam: 60 y.o. female in no distress Respiratory system: Non-labored breathing room air. Clear to auscultation bilaterally.  Cardiovascular system: Regular rate and rhythm. No murmur, rub, or gallop. No JVD, and no pedal edema. Gastrointestinal system: Abdomen soft, non-tender, non-distended, with normoactive bowel sounds. No organomegaly or masses felt. Central nervous system: Alert and oriented. No focal neurological deficits. Extremities: Warm, no deformities Skin: Improved recession of erythema spreading from central raised solid tissue ~3cm diameter on right breast at 3 0'clock. No adenopathy noted.  Psychiatry: Judgement and insight appear normal. Mood & affect appropriate.   Data Reviewed: I have personally reviewed following labs and imaging studies  CBC:  Recent Labs Lab 01/04/16 1720 01/05/16 0340 01/06/16 0811 01/07/16 0651 01/08/16 0524 01/09/16 0441  WBC 11.5* 8.4 7.4 7.2 5.4 5.1  NEUTROABS 8.6*  --   --   --   --   --   HGB 9.7* 8.0* 7.9* 7.9* 8.2* 8.4*  HCT 31.7* 26.2* 25.3* 25.4* 26.2* 27.5*  MCV 104.6* 102.3* 104.1* 103.3* 102.7* 103.4*  PLT 83* 58* 54* 53* 60* 64*   Basic Metabolic Panel:  Recent Labs Lab 01/05/16 0340 01/06/16 0811 01/07/16 0651 01/08/16 0524 01/09/16 0441  NA 139 138 136 139 138  K 4.8 4.8 4.0 4.4 4.5  CL 107 108 102 104 104  CO2 26 25 27 27 27   GLUCOSE 93 94 117* 96 97  BUN 10 9 12 15 13   CREATININE 0.66 0.61 0.62 0.81 0.64  CALCIUM 8.9 9.0 9.1 9.1 9.2   GFR: Estimated Creatinine Clearance: 69.9 mL/min (by C-G formula based on SCr of 0.64 mg/dL). Liver Function Tests:  Recent Labs Lab 01/04/16 1720  AST 15  ALT 11*  ALKPHOS 98  BILITOT 0.7  PROT 8.5*  ALBUMIN 4.4   No results for input(s): LIPASE, AMYLASE in the last 168 hours. No results for input(s): AMMONIA in the last 168  hours. Coagulation Profile: No results for input(s): INR, PROTIME in the last 168 hours. Cardiac Enzymes: No results for input(s): CKTOTAL, CKMB, CKMBINDEX, TROPONINI in the last 168 hours. BNP (last 3 results) No results for input(s): PROBNP in the last 8760 hours. HbA1C:  Recent Labs  01/08/16 0524  HGBA1C 4.7*   CBG: No results for input(s): GLUCAP in the last 168 hours. Lipid Profile: No results for input(s): CHOL, HDL, LDLCALC, TRIG, CHOLHDL, LDLDIRECT in the last 72 hours. Thyroid Function Tests: No results for input(s): TSH, T4TOTAL, FREET4, T3FREE, THYROIDAB in the last 72 hours. Anemia Panel: No results for input(s): VITAMINB12, FOLATE, FERRITIN, TIBC, IRON, RETICCTPCT in the last 72 hours. Urine analysis:    Component Value Date/Time   COLORURINE AMBER (A) 01/04/2016 1720   APPEARANCEUR CLEAR 01/04/2016 1720   LABSPEC 1.034 (H) 01/04/2016 1720   LABSPEC 1.010 07/15/2015 0944   PHURINE 6.5 01/04/2016 1720   GLUCOSEU NEGATIVE 01/04/2016 1720   GLUCOSEU Negative 07/15/2015 0944   HGBUR NEGATIVE 01/04/2016 1720   BILIRUBINUR  SMALL (A) 01/04/2016 1720   BILIRUBINUR Negative 07/15/2015 0944   KETONESUR NEGATIVE 01/04/2016 1720   PROTEINUR NEGATIVE 01/04/2016 1720   UROBILINOGEN 0.2 07/15/2015 0944   NITRITE NEGATIVE 01/04/2016 1720   LEUKOCYTESUR SMALL (A) 01/04/2016 1720   LEUKOCYTESUR Negative 07/15/2015 0944   Sepsis Labs: @LABRCNTIP (procalcitonin:4,lacticidven:4)  ) Recent Results (from the past 240 hour(s))  Urine culture     Status: None   Collection Time: 01/04/16  5:20 PM  Result Value Ref Range Status   Specimen Description URINE, CLEAN CATCH  Final   Special Requests NONE  Final   Culture NO GROWTH Performed at [facility]   Final   Report Status 01/05/2016 FINAL  Final  Culture, blood (Routine x 2)     Status: None   Collection Time: 01/04/16  5:29 PM  Result Value Ref Range Status   Specimen Description LEFT ANTECUBITAL  Final    Special Requests BOTTLES DRAWN AEROBIC AND ANAEROBIC 5CC  Final   Culture   Final    NO GROWTH 5 DAYS Performed at [facility]    Report Status 01/09/2016 FINAL  Final  Culture, blood (Routine x 2)     Status: None   Collection Time: 01/04/16  5:44 PM  Result Value Ref Range Status   Specimen Description BLOOD LEFT FOREARM  Final   Special Requests BOTTLES DRAWN AEROBIC AND ANAEROBIC 5CC  Final   Culture   Final    NO GROWTH 5 DAYS Performed at [facility]    Report Status 01/09/2016 FINAL  Final     Radiology Studies: US Breast Ltd Uni Right Inc Axilla  Result Date: 01/08/2016 CLINICAL DATA:  60 year old female with right breast pain, redness and swelling at right lumpectomy site, which was performed 28 years ago. History of left mastectomy for left breast cancer in 2017. EXAM: ULTRASOUND OF THE RIGHT BREAST COMPARISON:  Previous exam(s). FINDINGS: Targeted ultrasound is performed, showing a 4.2 x 1.3 x 4.5 solid heterogeneous hypoechoic and hyperechoic area at the 3 o'clock position of the right breast 9 cm from nipple at the site of previous lumpectomy. No focal fluid collections/abscess identified. IMPRESSION: No evidence of right breast abscess. Solid 4.2 x 1.3 x 4.5 cm heterogeneous area within the inner left breast, at the lumpectomy site. This is indeterminate and may represent postsurgical changes, infectious/ inflammatory changes or malignancy. RECOMMENDATION: Clinical followup. Consider tissue sampling and/or mammograms for further evaluation as clinically indicated. Electronically Signed   By: Margarette Canada M.D.   On: 01/08/2016 12:20    Scheduled Meds: . aspirin  81 mg Oral Daily  . atorvastatin  40 mg Oral q1800  . clopidogrel  75 mg Oral Q breakfast  . divalproex  500 mg Oral Daily  . divalproex  750 mg Oral QHS  . fluticasone  2 spray Each Nare QHS  . gabapentin  200 mg Oral BID  . lamoTRIgine  100 mg Oral BID  . levothyroxine  100 mcg Oral QAC  breakfast  . lidocaine-EPINEPHrine  10 mL Intradermal Once  . mupirocin ointment   Topical BID  . pantoprazole  40 mg Oral Daily  . polyethylene glycol  17 g Oral BID  . rOPINIRole  1.5 mg Oral QHS  . silver nitrate applicators  10 Stick Topical Once  . vancomycin  750 mg Intravenous Q12H   Continuous Infusions:    LOS: 5 days   Time spent: 25 minutes.  Vance Gather, MD [facility] Pager [phone]  If 7PM-7AM, please contact night-coverage [email or web address] Password [email or web address] 01/09/2016, 2:58 PM

## 2016-01-09 NOTE — Progress Notes (Signed)
[facility] Progress Note     Subjective: Denies fever in over 24 hours. Appetite improving. Reports having a "bad feeling" in her right breast on Sunday 11/12 and had a family member examine her breast on Monday 11/13, revealing a boil-like mass. Reports feeling like the wound and redness over her right breast are improving on antibiotics.   Objective: Vital signs in last 24 hours: Temp:  [97.7 F (36.5 C)-98.8 F (37.1 C)] 97.7 F (36.5 C) (11/20 0615) Pulse Rate:  [69-81] 69 (11/20 0615) Resp:  [18-19] 18 (11/20 0615) BP: (109-128)/(56-64) 128/56 (11/20 0615) SpO2:  [94 %-97 %] 97 % (11/20 0615) Last BM Date: 01/08/16  Intake/Output from previous day: 11/19 0701 - 11/20 0700 In: 900 [P.O.:600; IV Piggyback:300] Out: -  Intake/Output this shift: Total I/O In: 320 [P.O.:320] Out: -   PE: Gen:  Alert, NAD, pleasant and cooperative Pulm: unlabored, CTA, no W/R/R Breast: superficial necrosis/skin sloughing of right breast ~3-4 cm with firm underlying tissue, mild surrounding erythema, no fluctuance    Abd: Soft, NT/ND, +BS  Lab Results:   Recent Labs  01/08/16 0524 01/09/16 0441  WBC 5.4 5.1  HGB 8.2* 8.4*  HCT 26.2* 27.5*  PLT 60* 64*   BMET  Recent Labs  01/08/16 0524 01/09/16 0441  NA 139 138  K 4.4 4.5  CL 104 104  CO2 27 27  GLUCOSE 96 97  BUN 15 13  CREATININE 0.81 0.64  CALCIUM 9.1 9.2   CMP     Component Value Date/Time   NA 138 01/09/2016 0441   NA 140 12/22/2015 0822   K 4.5 01/09/2016 0441   K 4.5 12/22/2015 0822   CL 104 01/09/2016 0441   CO2 27 01/09/2016 0441   CO2 26 12/22/2015 0822   GLUCOSE 97 01/09/2016 0441   GLUCOSE 94 12/22/2015 0822   BUN 13 01/09/2016 0441   BUN 15.2 12/22/2015 0822   CREATININE 0.64 01/09/2016 0441   CREATININE 0.7 12/22/2015 0822   CALCIUM 9.2 01/09/2016 0441   CALCIUM 9.1 12/22/2015 0822   PROT 8.5 (H) 01/04/2016 1720   PROT 7.4 12/22/2015 0822   ALBUMIN 4.4 01/04/2016 1720   ALBUMIN 3.3 (L) 12/22/2015 0822   AST 15 01/04/2016 1720   AST 16 12/22/2015 0822   ALT 11 (L) 01/04/2016 1720   ALT 9 12/22/2015 0822   ALKPHOS 98 01/04/2016 1720   ALKPHOS 89 12/22/2015 0822   BILITOT 0.7 01/04/2016 1720   BILITOT 0.26 12/22/2015 0822   GFRNONAA >60 01/09/2016 0441   GFRAA >60 01/09/2016 0441   Lipase     Component Value Date/Time   LIPASE 28.0 07/03/2012 1359   Studies/Results: US Breast Ltd Uni Right Inc Axilla  Result Date: 01/08/2016 CLINICAL DATA:  60 year old female with right breast pain, redness and swelling at right lumpectomy site, which was performed 28 years ago. History of left mastectomy for left breast cancer in 2017. EXAM: ULTRASOUND OF THE RIGHT BREAST COMPARISON:  Previous exam(s). FINDINGS: Targeted ultrasound is performed, showing a 4.2 x 1.3 x 4.5 solid heterogeneous hypoechoic and hyperechoic area at the 3 o'clock position of the right breast 9 cm from nipple at the site of previous lumpectomy. No focal fluid collections/abscess identified. IMPRESSION: No evidence of right breast abscess. Solid 4.2 x 1.3 x 4.5 cm heterogeneous area within the inner left breast, at the lumpectomy site. This is indeterminate and may represent postsurgical changes, infectious/ inflammatory changes or malignancy. RECOMMENDATION: Clinical followup. Consider tissue sampling and/or  mammograms for further evaluation as clinically indicated. Electronically Signed   By: Margarette Canada M.D.   On: 01/08/2016 12:20    Anti-infectives: Anti-infectives    Start     Dose/Rate Route Frequency Ordered Stop   01/05/16 0600  vancomycin (VANCOCIN) IVPB 750 mg/150 ml premix     750 mg 150 mL/hr over 60 Minutes Intravenous Every 12 hours 01/04/16 1755     01/04/16 1830  vancomycin (VANCOCIN) 1,250 mg in sodium chloride 0.9 % 250 mL IVPB     1,250 mg 166.7 mL/hr over 90 Minutes Intravenous  Once 01/04/16 1755 01/04/16 2100     Assessment/Plan Right breast wound/cellulitis  PMH right  lumpectomy 1989  Right breast U/S 11/19: solid heterogeneous area "within inner left breast" inflammatory vs malignant changes.  Continue IV antibiotics  Appreciate cardiology consult- continue ASA/Plavix   ID: vancomycin 11/15 >>  VTE: SCD's, last dose lovenox 11/16  Thrombocytopenia  PMH peritoneal carcinomatosis - currently undergoing chemotherapy (carboplatin) Hypothyroidism  Dispo: continue IV antibiotics. MD to review ultrasound and most recent mammogram with radiology. Unlikely that patient will need OR debridement but biopsy of the affected breast tissue may be indicated.      LOS: 5 days    [facility] 01/09/2016, 10:18 AM Pager: [phone] Consults: [phone] Mon-Fri 7:00 am-4:30 pm Sat-Sun 7:00 am-11:30 am

## 2016-01-09 NOTE — Progress Notes (Signed)
IP PROGRESS NOTE  Subjective:   Events in the last few days noted. Patient clinically well without any recent complaints. She denied any fevers, chills or sweats. No chest pain or drainage noted on her chest wall lesion.   Objective:  Vital signs in last 24 hours: Temp:  [97.7 F (36.5 C)-98.8 F (37.1 C)] 97.7 F (36.5 C) (11/20 0615) Pulse Rate:  [69-81] 69 (11/20 0615) Resp:  [18-19] 18 (11/20 0615) BP: (109-128)/(56-64) 128/56 (11/20 0615) SpO2:  [94 %-97 %] 97 % (11/20 0615) Weight change:  Last BM Date: 01/08/16  Intake/Output from previous day: 11/19 0701 - 11/20 0700 In: 900 [P.O.:600; IV Piggyback:300] Out: -  Alert, awake woman without distress. Mouth: mucous membranes moist, pharynx normal without lesions Resp: clear to auscultation bilaterally  Chest wall examination: Open area of induration and drainage on the right side of her sternum. No masses palpated. Cardio: regular rate and rhythm, S1, S2 normal, no murmur, click, rub or gallop GI: soft, non-tender; bowel sounds normal; no masses,  no organomegaly Extremities: extremities normal, atraumatic, no cyanosis or edema    Lab Results:  Recent Labs  01/08/16 0524 01/09/16 0441  WBC 5.4 5.1  HGB 8.2* 8.4*  HCT 26.2* 27.5*  PLT 60* 64*    BMET  Recent Labs  01/08/16 0524 01/09/16 0441  NA 139 138  K 4.4 4.5  CL 104 104  CO2 27 27  GLUCOSE 96 97  BUN 15 13  CREATININE 0.81 0.64  CALCIUM 9.1 9.2    Studies/Results: US Breast Ltd Uni Right Inc Axilla  Result Date: 01/08/2016 CLINICAL DATA:  60 year old female with right breast pain, redness and swelling at right lumpectomy site, which was performed 28 years ago. History of left mastectomy for left breast cancer in 2017. EXAM: ULTRASOUND OF THE RIGHT BREAST COMPARISON:  Previous exam(s). FINDINGS: Targeted ultrasound is performed, showing a 4.2 x 1.3 x 4.5 solid heterogeneous hypoechoic and hyperechoic area at the 3 o'clock position of the  right breast 9 cm from nipple at the site of previous lumpectomy. No focal fluid collections/abscess identified. IMPRESSION: No evidence of right breast abscess. Solid 4.2 x 1.3 x 4.5 cm heterogeneous area within the inner left breast, at the lumpectomy site. This is indeterminate and may represent postsurgical changes, infectious/ inflammatory changes or malignancy. RECOMMENDATION: Clinical followup. Consider tissue sampling and/or mammograms for further evaluation as clinically indicated. Electronically Signed   By: Margarette Canada M.D.   On: 01/08/2016 12:20    Medications: I have reviewed the patient's current medications.  Assessment/Plan:  60 year old woman with the following issues:  1. Chest wall cellulitis: currently receiving intravenous antibiotics without any signs symptoms of sepsis. She is under evaluation for possible irrigation and debridement. Ultrasound of the left breast showed a 4.2 x 1.3 x 4.5 cm heterogeneous area within the inner left breast. This represents postsurgical changes or inflammation. Although malignancy so a possibility.  I agree with conservative management although if surgery is needed I feel he can still be done reasonably safe. She does have increased risk of bleeding because of her thrombocytopenia and antiplatelet agents. Platelet transfusion can be given prophylactically to allow for safe irrigation and debridement if needed to.  2. History of peritoneal carcinomatosis: She is currently receiving carboplatin IV chemotherapy as scheduled for her next treatment next week. This will be held if her infection is not fully healed at the time.  3. Thrombocytopenia: This has been chronic in nature related to her previous treatment  and possible cirrhosis of the liver.    LOS: 5 days   [name] 01/09/2016, 8:05 AM

## 2016-01-09 NOTE — Progress Notes (Signed)
Pharmacy Antibiotic Note  [name] is a 60 y.o. female admitted on 01/04/2016 with cellulitis at R mastectomy site. She has a past medical history significant for GYN cancer, active left breast cancer and right breast cancer in remission. Last chemotherapy on 12/22/15 and peg-filgrastim administered on 11/3 due to hx of neutropenia. Next chemo planned for after Thanksgiving, but will be held if her infection does not improve.   Symptoms of swelling and redness started on Sunday, 11/12, and worsened w/ fevers. At her PCP, she was given Rocephin IM and Bactrim x 1 dose on 11/15, but cellulitis worse upon presentation to ED. In ED, I&D attempted w/ little purulent drainage. Vancomycin was started for cellulitis.   Today, 01/09/16  - Day 5 abx - Vanc trough on 11/17 therapeutic at 12 while on 750 mg IV q12h.  - Has been afebrile since 11/17 - WBC trending down to 5.1 - SCr stable  - Surgery note stated there is some yellowish drainage and it appears to be necrotic skin/eschar with multiple small abscesses. Likely to be MRSA infection. Ultrasound on 11/18 did not find an abscess. No surgical intervention planned at this time.  Plan: - Continue Vancomycin 750 mg IV q12h.  - Monitor renal fxn, CBC and clinical course - If to continue with Vancomycin, will plan to check level on 11/24 to rule out accumulation.  ---------------------------------------------------------------------- Height: 5\' 1"  (154.9 cm) Weight: 160 lb 7.9 oz (72.8 kg) IBW/kg (Calculated) : 47.8  Temp (24hrs), Avg:98.2 F (36.8 C), Min:97.7 F (36.5 C), Max:98.8 F (37.1 C)   Recent Labs Lab 01/04/16 1750 01/05/16 0340 01/06/16 0811 01/06/16 1649 01/07/16 0651 01/08/16 0524 01/09/16 0441  WBC  --  8.4 7.4  --  7.2 5.4 5.1  CREATININE  --  0.66 0.61  --  0.62 0.81 0.64  LATICACIDVEN 1.36  --   --   --   --   --   --   VANCOTROUGH  --   --   --  12*  --   --   --     Estimated Creatinine Clearance: 69.9 mL/min  (by C-G formula based on SCr of 0.64 mg/dL).    Allergies  Allergen Reactions  . Doxil [Doxorubicin Hcl Liposomal] Anaphylaxis    1st Doxil.   . Pollen Extract Other (See Comments)    Pollen and grass causes a lot sneezing   Antimicrobials this admission: Vancomycin 11/15>>  Dose adjustments this admission: 11/17: 1700 VT = 42mcg/mL on Vanc 750mg  BID (before 4th dose of this regimen, prior to 5th dose overall)  Microbiology results: 11/15BCx: NGTD 11/15UCx: NGTD  Thank you for allowing pharmacy to be a part of this patient's care.  Sallyanne Havers Student-PharmD 01/09/2016, 7:40 AM

## 2016-01-09 NOTE — Addendum Note (Signed)
Encounter addended by: Jewel Baize, RD on: 01/09/2016  2:03 PM<BR>    Actions taken: Visit Navigator Flowsheet section accepted

## 2016-01-10 ENCOUNTER — Ambulatory Visit: Payer: PPO | Admitting: Family Medicine

## 2016-01-10 ENCOUNTER — Telehealth: Payer: Self-pay | Admitting: *Deleted

## 2016-01-10 ENCOUNTER — Ambulatory Visit (HOSPITAL_COMMUNITY): Payer: PPO

## 2016-01-10 DIAGNOSIS — N61 Mastitis without abscess: Secondary | ICD-10-CM | POA: Diagnosis not present

## 2016-01-10 MED ORDER — DOXYCYCLINE HYCLATE 100 MG PO TABS
100.0000 mg | ORAL_TABLET | Freq: Two times a day (BID) | ORAL | Status: DC
  Administered 2016-01-11: 100 mg via ORAL
  Filled 2016-01-10: qty 1

## 2016-01-10 MED ORDER — MORPHINE SULFATE (PF) 2 MG/ML IV SOLN
INTRAVENOUS | Status: AC
  Filled 2016-01-10: qty 1

## 2016-01-10 MED ORDER — MORPHINE SULFATE (PF) 2 MG/ML IV SOLN
2.0000 mg | Freq: Once | INTRAVENOUS | Status: AC
  Administered 2016-01-10: 2 mg via INTRAVENOUS

## 2016-01-10 NOTE — Progress Notes (Signed)
PROGRESS NOTE  [name]  [id number] DOB: [birth year] DOA: 01/04/2016 PCP: Betty [name], MD  Outpatient Specialists: Dr. Alen Blew, oncology  Brief Narrative: [name] is a 60 y.o. female with a history of ovarian CA with peritoneal carcinomatosis Dx 2014 on chemotherapy, remote right breast CA s/p lumpectomy, chemo, radiation 1989 and recent Dx left breast CA. Also has CAD s/p DES in Sept 2017. She presented with general malaise, worsening redness, swelling, tenderness on right chest near site of lumpectomy in 1989. These symptoms failed to improve with CTX x1 and bactrim prescribed by PCP. On arrival temperature was 100.31F and erythematous right breast was noted. Attempted I&D was not productive of much purulence. IV antibiotics were started and she was admitted for abscess and cellulitis.Wound has continued to drain, erythema improving very slowly. Surgery was consulted for evaluation for debridement. Ultrasound showed solid 4.2 x 1.3 x 4.5 cm heterogeneous area that is indeterminate (postsurgical, inflammatory, malignancy, infectious). Punch biopsy performed 01/10/2016. Erythema continues to improve.   Assessment & Plan: Principal Problem:   Cellulitis of right breast Active Problems:   Hyperlipidemia   BREAST CANCER, HX OF   Malignant neoplasm of female breast ([place])   Primary peritoneal carcinomatosis ([place])   Coronary artery disease involving native heart without angina pectoris   GERD (gastroesophageal reflux disease)   Sepsis ([facility])  Cellulitis and abscess/mass of right breast: With mild sepsis now resolved. Negative mammo this summer. Per Dr. Alen Blew, local recurrence of breast cancer unlikely. - Continue bactroban applied BID and transition vancomycin > doxycycline po.  - U/S 11/19 showed indeterminate solid area (DDx includes infectious, inflammatory, postsurgical, malignancy).  - Follow up punch biopsy obtained 11/21.  - Surgery following.  Macrocytic anemia:  Chronic due to chemotherapy. No signs of bleeding. - Monitor CBC  Peritoneal carcinomatosis and GYN malignancy:  - Undergoing chemo for ovarian and breast CA.  - Next chemo after Thanksgiving, will be held if infection not completely resolved.  - Dr. Hazeline Junker involvement is appreciated  CAD s/p DES in Sept 2017: No angina, low suspicion for ACS.  - Continue ASA, plavix, statin. - Holding ACE inhibitor and imdur due to hypotension - Appreciate cardiology input: continue DAPT  Depression: Chronic, stable - Continue lamictal and depakote. No indication to check level.  Hypothyroidism: Last TSH 5.035 in Sept 2017.  - Continue synthroid - Recheck TSH recommended outside scope of current illness.  GERD: Chronic, stable - Continue protonix  Restless legs: Chronic, stable - Continue ropinirole  DVT prophylaxis: SCDs Code Status: Full Family Communication: Husband at bedside. Contacted brother, who is a Garment/textile technologist, at family's request. Left HIPAA-compliant voice message.  Disposition Plan: Biopsy today, transition to po abx and discharge if stable in next 24-48 hrs.  Consultants:   Dr. Alen Blew, oncology  General surgery, Dr. Redmond Pulling  Dr. Debara Pickett, cardiology  Procedures:   I&D 11/15  Punch biopsy 11/21  Antimicrobials:  Vancomycin 11/15 >> 11/21  Doxycycline 11/22 >>   Subjective: Pain and redness improved, still draining, unroofed. No chest pain, dyspnea or other complaints. Friends coming to visit today.   Objective: Vitals:   01/09/16 0615 01/09/16 2122 01/09/16 2148 01/10/16 0515  BP: (!) 128/56 133/67  (!) 100/54  Pulse: 69 70 82 73  Resp: 18 16  18   Temp: 97.7 F (36.5 C) 98.4 F (36.9 C)  97.6 F (36.4 C)  TempSrc: Oral Oral  Axillary  SpO2: 97%  100% 96%  Weight:      Height:  Intake/Output Summary (Last 24 hours) at 01/10/16 1357 Last data filed at 01/10/16 0519  Gross per 24 hour  Intake              300 ml  Output                0 ml  Net               300 ml   Filed Weights   01/04/16 1849 01/04/16 2130  Weight: 73 kg (161 lb) 72.8 kg (160 lb 7.9 oz)    Examination: General exam: 60 y.o. female in no distress Respiratory system: Non-labored breathing room air. Clear to auscultation bilaterally.  Cardiovascular system: Regular rate and rhythm. No murmur, rub, or gallop. No JVD, and no pedal edema. Gastrointestinal system: Abdomen soft, non-tender, non-distended, with normoactive bowel sounds. No organomegaly or masses felt. Central nervous system: Alert and oriented. No focal neurological deficits. Extremities: Warm, no deformities Skin: Improved recession of erythema spreading from central raised solid tissue ~3cm diameter on right breast at 3 o'clock. No adenopathy noted.  Psychiatry: Judgement and insight appear normal. Mood & affect appropriate.   Data Reviewed: I have personally reviewed following labs and imaging studies  CBC:  Recent Labs Lab 01/04/16 1720 01/05/16 0340 01/06/16 0811 01/07/16 0651 01/08/16 0524 01/09/16 0441  WBC 11.5* 8.4 7.4 7.2 5.4 5.1  NEUTROABS 8.6*  --   --   --   --   --   HGB 9.7* 8.0* 7.9* 7.9* 8.2* 8.4*  HCT 31.7* 26.2* 25.3* 25.4* 26.2* 27.5*  MCV 104.6* 102.3* 104.1* 103.3* 102.7* 103.4*  PLT 83* 58* 54* 53* 60* 64*   Basic Metabolic Panel:  Recent Labs Lab 01/05/16 0340 01/06/16 0811 01/07/16 0651 01/08/16 0524 01/09/16 0441  NA 139 138 136 139 138  K 4.8 4.8 4.0 4.4 4.5  CL 107 108 102 104 104  CO2 26 25 27 27 27   GLUCOSE 93 94 117* 96 97  BUN 10 9 12 15 13   CREATININE 0.66 0.61 0.62 0.81 0.64  CALCIUM 8.9 9.0 9.1 9.1 9.2   GFR: Estimated Creatinine Clearance: 69.9 mL/min (by C-G formula based on SCr of 0.64 mg/dL). Liver Function Tests:  Recent Labs Lab 01/04/16 1720  AST 15  ALT 11*  ALKPHOS 98  BILITOT 0.7  PROT 8.5*  ALBUMIN 4.4   No results for input(s): LIPASE, AMYLASE in the last 168 hours. No results for input(s): AMMONIA in the last 168  hours. Coagulation Profile: No results for input(s): INR, PROTIME in the last 168 hours. Cardiac Enzymes: No results for input(s): CKTOTAL, CKMB, CKMBINDEX, TROPONINI in the last 168 hours. BNP (last 3 results) No results for input(s): PROBNP in the last 8760 hours. HbA1C:  Recent Labs  01/08/16 0524  HGBA1C 4.7*   CBG: No results for input(s): GLUCAP in the last 168 hours. Lipid Profile: No results for input(s): CHOL, HDL, LDLCALC, TRIG, CHOLHDL, LDLDIRECT in the last 72 hours. Thyroid Function Tests: No results for input(s): TSH, T4TOTAL, FREET4, T3FREE, THYROIDAB in the last 72 hours. Anemia Panel: No results for input(s): VITAMINB12, FOLATE, FERRITIN, TIBC, IRON, RETICCTPCT in the last 72 hours. Urine analysis:    Component Value Date/Time   COLORURINE AMBER (A) 01/04/2016 1720   APPEARANCEUR CLEAR 01/04/2016 1720   LABSPEC 1.034 (H) 01/04/2016 1720   LABSPEC 1.010 07/15/2015 0944   PHURINE 6.5 01/04/2016 1720   GLUCOSEU NEGATIVE 01/04/2016 1720   GLUCOSEU Negative 07/15/2015 0944   HGBUR  NEGATIVE 01/04/2016 1720   BILIRUBINUR SMALL (A) 01/04/2016 1720   BILIRUBINUR Negative 07/15/2015 0944   KETONESUR NEGATIVE 01/04/2016 1720   PROTEINUR NEGATIVE 01/04/2016 1720   UROBILINOGEN 0.2 07/15/2015 0944   NITRITE NEGATIVE 01/04/2016 1720   LEUKOCYTESUR SMALL (A) 01/04/2016 1720   LEUKOCYTESUR Negative 07/15/2015 0944   Sepsis Labs: @LABRCNTIP (procalcitonin:4,lacticidven:4)  ) Recent Results (from the past 240 hour(s))  Urine culture     Status: None   Collection Time: 01/04/16  5:20 PM  Result Value Ref Range Status   Specimen Description URINE, CLEAN CATCH  Final   Special Requests NONE  Final   Culture NO GROWTH Performed at [facility]   Final   Report Status 01/05/2016 FINAL  Final  Culture, blood (Routine x 2)     Status: None   Collection Time: 01/04/16  5:29 PM  Result Value Ref Range Status   Specimen Description LEFT ANTECUBITAL  Final    Special Requests BOTTLES DRAWN AEROBIC AND ANAEROBIC 5CC  Final   Culture   Final    NO GROWTH 5 DAYS Performed at [facility]    Report Status 01/09/2016 FINAL  Final  Culture, blood (Routine x 2)     Status: None   Collection Time: 01/04/16  5:44 PM  Result Value Ref Range Status   Specimen Description BLOOD LEFT FOREARM  Final   Special Requests BOTTLES DRAWN AEROBIC AND ANAEROBIC 5CC  Final   Culture   Final    NO GROWTH 5 DAYS Performed at [facility]    Report Status 01/09/2016 FINAL  Final     Radiology Studies: No results found.  Scheduled Meds: . aspirin  81 mg Oral Daily  . atorvastatin  40 mg Oral q1800  . clopidogrel  75 mg Oral Q breakfast  . divalproex  500 mg Oral Daily  . divalproex  750 mg Oral QHS  . [START ON 01/11/2016] doxycycline  100 mg Oral Q12H  . fluticasone  2 spray Each Nare QHS  . gabapentin  200 mg Oral BID  . lamoTRIgine  100 mg Oral BID  . levothyroxine  100 mcg Oral QAC breakfast  . lidocaine-EPINEPHrine  10 mL Intradermal Once  . mupirocin ointment   Topical BID  . pantoprazole  40 mg Oral Daily  . polyethylene glycol  17 g Oral BID  . rOPINIRole  1.5 mg Oral QHS  . silver nitrate applicators  10 Stick Topical Once  . vancomycin  750 mg Intravenous Q12H   Continuous Infusions:    LOS: 6 days   Time spent: 25 minutes.  Vance Gather, MD [facility] Pager [phone]  If 7PM-7AM, please contact night-coverage [email or web address] Password [email or web address] 01/10/2016, 1:57 PM

## 2016-01-10 NOTE — Telephone Encounter (Signed)
-----   Message from Wyatt Portela, MD sent at 01/09/2016 10:33 AM EST ----- We need to cancel her scans for 11/21. She is hospitalized now and will reschedule these at a later date.   Thanks,   FS

## 2016-01-10 NOTE — Procedures (Signed)
   Procedure Note Punch biopsy of the right breast in a patient with active breast cancer of the left breast and primary peritoneal carcinomatosis.  The area was prepped and draped in the usual, sterile fashion.  Anesthesia was obtained with 1% lidocaine with epinephrine.  A full thickness punch biopsy was obtained with a 49mm punch. Minimal bleeding, controlled with pressure and 1 stick of silver nitrate. Wound left open. Covered with a dry gauze dressing. Cover with dry dressing and change as needed for contamination/bleeding. Wound care discussed.  Specimen sent for dermatopathology.   Obie Dredge, PA-C [facility] Pager: [phone] Consults: [phone] Mon-Fri 7:00 am-4:30 pm Sat-Sun 7:00 am-11:30 am

## 2016-01-10 NOTE — Telephone Encounter (Signed)
Per dr Alen Blew, [facility] radiology, CT dept and cancelled CT for today. Patient is currently an inpt. Will re-schedule at a later date

## 2016-01-11 ENCOUNTER — Encounter (HOSPITAL_COMMUNITY): Payer: PPO

## 2016-01-11 ENCOUNTER — Ambulatory Visit (HOSPITAL_COMMUNITY): Payer: PPO

## 2016-01-11 DIAGNOSIS — D696 Thrombocytopenia, unspecified: Secondary | ICD-10-CM | POA: Diagnosis not present

## 2016-01-11 DIAGNOSIS — C50912 Malignant neoplasm of unspecified site of left female breast: Secondary | ICD-10-CM | POA: Diagnosis not present

## 2016-01-11 DIAGNOSIS — K219 Gastro-esophageal reflux disease without esophagitis: Secondary | ICD-10-CM

## 2016-01-11 DIAGNOSIS — I2510 Atherosclerotic heart disease of native coronary artery without angina pectoris: Secondary | ICD-10-CM | POA: Diagnosis not present

## 2016-01-11 DIAGNOSIS — N61 Mastitis without abscess: Secondary | ICD-10-CM | POA: Diagnosis not present

## 2016-01-11 DIAGNOSIS — C482 Malignant neoplasm of peritoneum, unspecified: Secondary | ICD-10-CM | POA: Diagnosis not present

## 2016-01-11 DIAGNOSIS — A419 Sepsis, unspecified organism: Secondary | ICD-10-CM | POA: Diagnosis not present

## 2016-01-11 DIAGNOSIS — L03313 Cellulitis of chest wall: Secondary | ICD-10-CM | POA: Diagnosis not present

## 2016-01-11 MED ORDER — DOXYCYCLINE HYCLATE 100 MG PO TABS: 100.0000 mg | ORAL_TABLET | Freq: Two times a day (BID) | ORAL | 0 refills | Status: DC

## 2016-01-11 MED ORDER — MUPIROCIN 2 % EX OINT: TOPICAL_OINTMENT | Freq: Two times a day (BID) | CUTANEOUS | 0 refills | Status: DC

## 2016-01-11 MED ORDER — ACETAMINOPHEN 325 MG PO TABS: 650.0000 mg | ORAL_TABLET | ORAL | 0 refills | Status: AC | PRN

## 2016-01-11 NOTE — Discharge Instructions (Addendum)
Cellulitis, Adult Introduction Cellulitis is a skin infection. The infected area is usually red and sore. This condition occurs most often in the arms and lower legs. It is very important to get treated for this condition. Follow these instructions at home:  Take over-the-counter and prescription medicines only as told by your doctor.  If you were prescribed an antibiotic medicine, take it as told by your doctor. Do not stop taking the antibiotic even if you start to feel better.  Drink enough fluid to keep your pee (urine) clear or pale yellow.  Do not touch or rub the infected area.  Raise (elevate) the infected area above the level of your heart while you are sitting or lying down.  Place warm or cold wet cloths (warm or cold compresses) on the infected area. Do this as told by your doctor.  Keep all follow-up visits as told by your doctor. This is important. These visits let your doctor make sure your infection is not getting worse. Contact a doctor if:  You have a fever.  Your symptoms do not get better after 1-2 days of treatment.  Your bone or joint under the infected area starts to hurt after the skin has healed.  Your infection comes back. This can happen in the same area or another area.  You have a swollen bump in the infected area.  You have new symptoms.  You feel ill and also have muscle aches and pains. Get help right away if:  Your symptoms get worse.  You feel very sleepy.  You throw up (vomit) or have watery poop (diarrhea) for a long time.  There are red streaks coming from the infected area.  Your red area gets larger.  Your red area turns darker. This information is not intended to replace advice given to you by your health care provider. Make sure you discuss any questions you have with your health care provider. Document Released: 07/25/2007 Document Revised: 07/14/2015 Document Reviewed: 12/15/2014  2017 Elsevier Doxycycline delayed-release  tablets What is this medicine? DOXYCYCLINE (dox i SYE kleen) is a tetracycline antibiotic. It kills certain bacteria or stops their growth. It is used to treat many kinds of infections, like dental, skin, respiratory, and urinary tract infections. It also treats acne, Lyme disease, malaria, and certain sexually transmitted infections. This medicine may be used for other purposes; ask your health care provider or pharmacist if you have questions. COMMON BRAND NAME(S): Doryx What should I tell my health care provider before I take this medicine? They need to know if you have any of these conditions: -bowel disease like colitis -liver disease -long exposure to sunlight like working outdoors -an unusual or allergic reaction to doxycycline, tetracycline antibiotics, other medicines, foods, dyes, or preservatives -pregnant or trying to get pregnant -breast-feeding How should I use this medicine? Take this medicine by mouth with a full glass of water. Follow the directions on the prescription label. Do not crush or chew. It is best to take this medicine without food, but if it upsets your stomach take it with food. Take your medicine at regular intervals. Do not take your medicine more often than directed. Take all of your medicine as directed even if you think your are better. Do not skip doses or stop your medicine early. Talk to your pediatrician regarding the use of this medicine in children. While this drug may be prescribed for selected conditions, precautions do apply. Overdosage: If you think you have taken too much of this medicine  contact a poison control center or emergency room at once. NOTE: This medicine is only for you. Do not share this medicine with others. What if I miss a dose? If you miss a dose, take it as soon as you can. If it is almost time for your next dose, take only that dose. Do not take double or extra doses. What may interact with this  medicine? -antacids -barbiturates -birth control pills -bismuth subsalicylate -carbamazepine -methoxyflurane -other antibiotics -phenytoin -vitamins that contain iron -warfarin This list may not describe all possible interactions. Give your health care provider a list of all the medicines, herbs, non-prescription drugs, or dietary supplements you use. Also tell them if you smoke, drink alcohol, or use illegal drugs. Some items may interact with your medicine. What should I watch for while using this medicine? Tell your doctor or health care professional if your symptoms do not improve. Do not treat diarrhea with over the counter products. Contact your doctor if you have diarrhea that lasts more than 2 days or if it is severe and watery. Do not take this medicine just before going to bed. It may not dissolve properly when you lay down and can cause pain in your throat. Drink plenty of fluids while taking this medicine to also help reduce irritation in your throat. This medicine can make you more sensitive to the sun. Keep out of the sun. If you cannot avoid being in the sun, wear protective clothing and use sunscreen. Do not use sun lamps or tanning beds/booths. Birth control pills may not work properly while you are taking this medicine. Talk to your doctor about using an extra method of birth control. If you are being treated for a sexually transmitted infection, avoid sexual contact until you have finished your treatment. Your sexual partner may also need treatment. Avoid antacids, aluminum, calcium, magnesium, and iron products for 4 hours before and 2 hours after taking a dose of this medicine. If you are using this medicine to prevent malaria, you should still protect yourself from contact with mosquitos. Stay in screened-in areas, use mosquito nets, keep your body covered, and use an insect repellent. What side effects may I notice from receiving this medicine? Side effects that you  should report to your doctor or health care professional as soon as possible: -allergic reactions like skin rash, itching or hives, swelling of the face, lips, or tongue -difficulty breathing -fever -itching in the rectal or genital area -pain on swallowing -redness, blistering, peeling or loosening of the skin, including inside the mouth -severe stomach pain or cramps -unusual bleeding or bruising -unusually weak or tired -yellowing of the eyes or skin Side effects that usually do not require medical attention (report to your doctor or health care professional if they continue or are bothersome): -diarrhea -loss of appetite -nausea, vomiting This list may not describe all possible side effects. Call your doctor for medical advice about side effects. You may report side effects to FDA at 1-800-FDA-1088. Where should I keep my medicine? Keep out of the reach of children. Store at room temperature between 15 and 30 degrees C (59 and 86 degrees F). Protect from light. Keep container tightly closed. Throw away any unused medicine after the expiration date. Taking this medicine after the expiration date can make you seriously ill. NOTE: This sheet is a summary. It may not cover all possible information. If you have questions about this medicine, talk to your doctor, pharmacist, or health care provider.  2017 Elsevier/Gold  Standard (2015-03-10 10:40:13)

## 2016-01-11 NOTE — Discharge Summary (Signed)
Physician Discharge Summary  [name] [id number] DOB: 11-04-1955 DOA: 01/04/2016  PCP: Betty [name], MD  Admit date: 01/04/2016 Discharge date: 01/11/2016  Recommendations for Outpatient Follow-up:  Continue doxycycline for infection until you are seen by surgery in outpatient setting. Also, follow up with oncology per scheduled appointment.  Follow up punch biopsy obtained 11/21.  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 Also has CAD s/p DES in Sept 2017. She presented with general malaise, worsening redness, swelling, tenderness on right chest near site of lumpectomy in 1989. These symptoms failed to improve with CTX x1 and bactrim prescribed by PCP. On arrival temperature was 100.37F and erythematous right breast was noted. Attempted I&D was not productive of much purulence. IV antibiotics were started and she was admitted for abscess and cellulitis.Wound has continued to drain, erythema improving very slowly. Surgery was consulted for evaluation for debridement. Ultrasound showed solid 4.2 x 1.3 x 4.5 cm heterogeneous area that is indeterminate (postsurgical, inflammatory, malignancy, infectious). Punch biopsy performed 01/10/2016. Erythema continues to improve. "  Hospital Course:   Cellulitis and abscess/mass of right breast / Sepsis - Sepsis now  resolved. Negative mammo this summer. Per Dr. Alen Blew, local recurrence of breast cancer unlikely. - Continue bactroban applied BID and transitioned vancomycin > doxycycline po. Will need long term abx due to severity of the wound and infection   - U/S 11/19 showed indeterminate solid area   - Follow up punch biopsy obtained 11/21.  - Stable for discharge form surgical standpoint, plastic to see the pt on outpt basis, surgery to set up the appt per their note   Macrocytic anemia / Anemia of chronic disease  - Chronic due to chemotherapy - No signs of bleeding.  Peritoneal carcinomatosis and GYN malignancy - Undergoing chemo for ovarian and breast CA.  - Next chemo after Thanksgiving, will be held if infection not completely resolved.  - Dr. Alen Blew has see the pt in consultation   CAD s/p DES in Sept 2017 - No CP - Continue ASA, plavix, statin.  Depression - Chronic, stable - Continue lamictal and depakote. No indication to check level.  Hypothyroidism - Last TSH 5.035 in Sept 2017.  - Continue synthroid  GERD - Chronic, stable - Continue protonix  Restless legs - Chronic, stable - Continue ropinirole  DVT prophylaxis: SCD's bilaterally  Code Status: Full Family Communication: Husband at bedside this am   Consultants:   Dr. Alen Blew, oncology  General surgery, Dr. Redmond Pulling  Dr. Debara Pickett, cardiology  Procedures:   I&D 11/15  Punch biopsy 11/21  Antimicrobials:  Vancomycin 11/15 >> 11/21  Doxycycline 11/22 >> for 1 month on discharge    Signed:  Leisa Lenz, MD  [facility] 01/11/2016, 3:29 PM  Pager #: [phone]  Time spent in minutes: less than 30 minutes    Discharge Exam: Vitals:   01/10/16 2008 01/11/16 0503  BP: 111/65 Marland Kitchen)  118/49  Pulse: 77 78  Resp: 17 15  Temp: 98.1 F (36.7 C) 98.2 F (36.8 C)   Vitals:   01/10/16 0515 01/10/16 1500 01/10/16 2008 01/11/16 0503  BP: (!) 100/54 (!) 115/52 111/65 (!) 118/49   Pulse: 73 76 77 78  Resp: 18 16 17 15   Temp: 97.6 F (36.4 C) 97.8 F (36.6 C) 98.1 F (36.7 C) 98.2 F (36.8 C)  TempSrc: Axillary Oral Oral Oral  SpO2: 96% 97% 99% 96%  Weight:      Height:        General: Pt is alert, follows commands appropriately, not in acute distress Cardiovascular: Regular rate and rhythm, S1/S2 +, no murmurs Respiratory: Clear to auscultation bilaterally, no wheezing, no crackles, no rhonchi Abdominal: Soft, non tender, non distended, bowel sounds +, no guarding Extremities: no edema, no cyanosis, pulses palpable bilaterally DP and PT Neuro: Grossly nonfocal  Discharge Instructions  Discharge Instructions    Call MD for:  persistant nausea and vomiting    Complete by:  As directed    Call MD for:  redness, tenderness, or signs of infection (pain, swelling, redness, odor or green/yellow discharge around incision site)    Complete by:  As directed    Call MD for:  severe uncontrolled pain    Complete by:  As directed    Diet - low sodium heart healthy    Complete by:  As directed    Discharge instructions    Complete by:  As directed    Continue doxycycline for infection until you are seen by surgery in outpatient setting. Also, follow up with oncology per scheduled appointment.   Increase activity slowly    Complete by:  As directed        Medication List    STOP taking these medications   HYDROcodone-acetaminophen 5-325 MG tablet Commonly known as:  NORCO/VICODIN   sulfamethoxazole-trimethoprim 800-160 MG tablet Commonly known as:  BACTRIM DS,SEPTRA DS     TAKE these medications   acetaminophen 325 MG tablet Commonly known as:  TYLENOL Take 2 tablets (650 mg total) by mouth every 4 (four) hours as needed for mild pain, fever or headache.   ACETYL L-CARNITINE PO Take 2,000 mg by mouth at bedtime.   ALPRAZolam 0.5 MG tablet Commonly known as:  XANAX Take 0.5 mg by mouth 2 (two) times daily as needed for anxiety.   aspirin 81 MG  chewable tablet Chew 1 tablet (81 mg total) by mouth daily.   atorvastatin 40 MG tablet Commonly known as:  LIPITOR Take 1 tablet (40 mg total) by mouth daily at 6 PM.   carvedilol 3.125 MG tablet Commonly known as:  COREG Take 0.5 tablets (1.56 mg total) by mouth 2 (two) times daily with a meal.   clopidogrel 75 MG tablet Commonly known as:  PLAVIX Take 1 tablet (75 mg total) by mouth daily with breakfast.   DEPAKOTE 250 MG DR tablet Generic drug:  divalproex Take by mouth. Take 500mg s in the morning and 750mg s at night   doxycycline 100 MG tablet Commonly known as:  VIBRA-TABS Take 1 tablet (100 mg total) by mouth every 12 (twelve) hours.   fluticasone 50 MCG/ACT nasal spray Commonly known as:  FLONASE INHALE 2 SPRAYS INTO EACH NOSTRIL EVERY NIGHT   gabapentin 100 MG capsule Commonly known as:  NEURONTIN Take 1 capsule (100 mg total) by mouth 3 (three) times daily. What changed:  how much to take  when to take this  isosorbide mononitrate 30 MG 24 hr tablet Commonly known as:  IMDUR Take 1 tablet (30 mg total) by mouth daily. What changed:  how much to take   lamoTRIgine 100 MG tablet Commonly known as:  LAMICTAL Take 100 mg by mouth 2 (two) times daily.   levothyroxine 100 MCG tablet Commonly known as:  SYNTHROID, LEVOTHROID Take 100 mcg by mouth daily before breakfast.   lidocaine-prilocaine cream Commonly known as:  EMLA Apply topically as needed. Apply to port with every chemotherapy.   lisinopril 2.5 MG tablet Commonly known as:  PRINIVIL,ZESTRIL Take 1 tablet (2.5 mg total) by mouth daily.   loperamide 2 MG tablet Commonly known as:  IMODIUM A-D Take 2 mg by mouth as needed for diarrhea or loose stools.   loratadine 10 MG tablet Commonly known as:  CLARITIN Take 10 mg by mouth daily as needed for allergies (with chemo).   mupirocin ointment 2 % Commonly known as:  BACTROBAN Apply topically 2 (two) times daily.   nitroGLYCERIN 0.4 MG SL  tablet Commonly known as:  NITROSTAT Place 1 tablet (0.4 mg total) under the tongue every 5 (five) minutes as needed for chest pain.   ondansetron 8 MG tablet Commonly known as:  ZOFRAN TAKE 1 TABLET BY MOUTH EVERY 8 HOURS AS NEEDED FOR NAUSEA AND VOMITING   pantoprazole 40 MG tablet Commonly known as:  PROTONIX Take 1 tablet (40 mg total) by mouth daily.   rOPINIRole 0.5 MG tablet Commonly known as:  REQUIP Take 1.5 mg by mouth at bedtime.       Follow-up Information    Betty [name], MD. Schedule an appointment as soon as possible for a visit in 2 week(s).   Specialty:  Family Medicine Contact information: [place] [address] [phone]            The results of significant diagnostics from this hospitalization (including imaging, microbiology, ancillary and laboratory) are listed below for reference.    Significant Diagnostic Studies: Dg Chest 2 View  Result Date: 01/04/2016 CLINICAL DATA:  Fever x2 days, cellulitis of right chest EXAM: CHEST  2 VIEW COMPARISON:  CT chest dated 10/25/2015 FINDINGS: Mild patchy right lower lobe opacity, atelectasis versus pneumonia. Left lung is clear. No pleural effusion or pneumothorax. The heart is normal in size. Right chest power port terminates at the cavoatrial junction. IMPRESSION: Mild patchy right lower lobe opacity, atelectasis versus pneumonia. Electronically Signed   By: Julian Hy M.D.   On: 01/04/2016 18:23   US Breast Ltd Uni Right Inc Axilla  Result Date: 01/08/2016 CLINICAL DATA:  60 year old female with right breast pain, redness and swelling at right lumpectomy site, which was performed 28 years ago. History of left mastectomy for left breast cancer in 2017. EXAM: ULTRASOUND OF THE RIGHT BREAST COMPARISON:  Previous exam(s). FINDINGS: Targeted ultrasound is performed, showing a 4.2 x 1.3 x 4.5 solid heterogeneous hypoechoic and hyperechoic area at the 3 o'clock position of the right  breast 9 cm from nipple at the site of previous lumpectomy. No focal fluid collections/abscess identified. IMPRESSION: No evidence of right breast abscess. Solid 4.2 x 1.3 x 4.5 cm heterogeneous area within the inner left breast, at the lumpectomy site. This is indeterminate and may represent postsurgical changes, infectious/ inflammatory changes or malignancy. RECOMMENDATION: Clinical followup. Consider tissue sampling and/or mammograms for further evaluation as clinically indicated. Electronically Signed   By: Margarette Canada M.D.   On: 01/08/2016 12:20    Microbiology: Recent  Results (from the past 240 hour(s))  Urine culture     Status: None   Collection Time: 01/04/16  5:20 PM  Result Value Ref Range Status   Specimen Description URINE, CLEAN CATCH  Final   Special Requests NONE  Final   Culture NO GROWTH Performed at [facility] : [facility]   Final   Report Status 01/05/2016 FINAL  Final  Culture, blood (Routine x 2)     Status: None   Collection Time: 01/04/16  5:29 PM  Result Value Ref Range Status   Specimen Description LEFT ANTECUBITAL  Final   Special Requests BOTTLES DRAWN AEROBIC AND ANAEROBIC 5CC  Final   Culture   Final    NO GROWTH 5 DAYS Performed at [facility]    Report Status 01/09/2016 FINAL  Final  Culture, blood (Routine x 2)     Status: None   Collection Time: 01/04/16  5:44 PM  Result Value Ref Range Status   Specimen Description BLOOD LEFT FOREARM  Final   Special Requests BOTTLES DRAWN AEROBIC AND ANAEROBIC 5CC  Final   Culture   Final    NO GROWTH 5 DAYS Performed at [facility]    Report Status 01/09/2016 FINAL  Final     Labs: Basic Metabolic Panel:  Recent Labs Lab 01/05/16 0340 01/06/16 0811 01/07/16 0651 01/08/16 0524 01/09/16 0441  NA 139 138 136 139 138  K 4.8 4.8 4.0 4.4 4.5  CL 107 108 102 104 104  CO2 26 25 27 27 27   GLUCOSE 93 94 117* 96 97  BUN 10 9 12 15 13   CREATININE 0.66 0.61 0.62 0.81 0.64  CALCIUM 8.9 9.0 9.1  9.1 9.2   Liver Function Tests:  Recent Labs Lab 01/04/16 1720  AST 15  ALT 11*  ALKPHOS 98  BILITOT 0.7  PROT 8.5*  ALBUMIN 4.4   No results for input(s): LIPASE, AMYLASE in the last 168 hours. No results for input(s): AMMONIA in the last 168 hours. CBC:  Recent Labs Lab 01/04/16 1720 01/05/16 0340 01/06/16 0811 01/07/16 0651 01/08/16 0524 01/09/16 0441  WBC 11.5* 8.4 7.4 7.2 5.4 5.1  NEUTROABS 8.6*  --   --   --   --   --   HGB 9.7* 8.0* 7.9* 7.9* 8.2* 8.4*  HCT 31.7* 26.2* 25.3* 25.4* 26.2* 27.5*  MCV 104.6* 102.3* 104.1* 103.3* 102.7* 103.4*  PLT 83* 58* 54* 53* 60* 64*   Cardiac Enzymes: No results for input(s): CKTOTAL, CKMB, CKMBINDEX, TROPONINI in the last 168 hours. BNP: BNP (last 3 results) No results for input(s): BNP in the last 8760 hours.  ProBNP (last 3 results) No results for input(s): PROBNP in the last 8760 hours.  CBG: No results for input(s): GLUCAP in the last 168 hours.

## 2016-01-11 NOTE — Progress Notes (Signed)
[facility] Progress Note     Subjective: No complaints. Denies fever or chills. Denies excess bleeding from biopsy site and denies complications with her wound. Tolerating PO and reports increased appetite.  Objective: Vital signs in last 24 hours: Temp:  [97.8 F (36.6 C)-98.2 F (36.8 C)] 98.2 F (36.8 C) (11/22 0503) Pulse Rate:  [76-78] 78 (11/22 0503) Resp:  [15-17] 15 (11/22 0503) BP: (111-118)/(49-65) 118/49 (11/22 0503) SpO2:  [96 %-99 %] 96 % (11/22 0503) Last BM Date: 01/11/16  Intake/Output from previous day: 11/21 0701 - 11/22 0700 In: 430 [P.O.:280; IV Piggyback:150] Out: -  Intake/Output this shift: Total I/O In: 240 [P.O.:240] Out: -   PE: Gen:  Alert, NAD, pleasant Pulm:  CTA, no W/R/R Right breast - skin breakdown/ulceration of right breast stable with no active bleeding or drainage. Surrounding cellulitis has almost completely resolved.   Lab Results:   Recent Labs  01/09/16 0441  WBC 5.1  HGB 8.4*  HCT 27.5*  PLT 64*   BMET  Recent Labs  01/09/16 0441  NA 138  K 4.5  CL 104  CO2 27  GLUCOSE 97  BUN 13  CREATININE 0.64  CALCIUM 9.2   PT/INR No results for input(s): LABPROT, INR in the last 72 hours. CMP     Component Value Date/Time   NA 138 01/09/2016 0441   NA 140 12/22/2015 0822   K 4.5 01/09/2016 0441   K 4.5 12/22/2015 0822   CL 104 01/09/2016 0441   CO2 27 01/09/2016 0441   CO2 26 12/22/2015 0822   GLUCOSE 97 01/09/2016 0441   GLUCOSE 94 12/22/2015 0822   BUN 13 01/09/2016 0441   BUN 15.2 12/22/2015 0822   CREATININE 0.64 01/09/2016 0441   CREATININE 0.7 12/22/2015 0822   CALCIUM 9.2 01/09/2016 0441   CALCIUM 9.1 12/22/2015 0822   PROT 8.5 (H) 01/04/2016 1720   PROT 7.4 12/22/2015 0822   ALBUMIN 4.4 01/04/2016 1720   ALBUMIN 3.3 (L) 12/22/2015 0822   AST 15 01/04/2016 1720   AST 16 12/22/2015 0822   ALT 11 (L) 01/04/2016 1720   ALT 9 12/22/2015 0822   ALKPHOS 98 01/04/2016 1720   ALKPHOS 89  12/22/2015 0822   BILITOT 0.7 01/04/2016 1720   BILITOT 0.26 12/22/2015 0822   GFRNONAA >60 01/09/2016 0441   GFRAA >60 01/09/2016 0441   Lipase     Component Value Date/Time   LIPASE 28.0 07/03/2012 1359       Studies/Results: No results found.  Anti-infectives: Anti-infectives    Start     Dose/Rate Route Frequency Ordered Stop   01/11/16 1000  doxycycline (VIBRA-TABS) tablet 100 mg     100 mg Oral Every 12 hours 01/10/16 1020     01/05/16 0600  vancomycin (VANCOCIN) IVPB 750 mg/150 ml premix     750 mg 150 mL/hr over 60 Minutes Intravenous Every 12 hours 01/04/16 1755 01/10/16 1856   01/04/16 1830  vancomycin (VANCOCIN) 1,250 mg in sodium chloride 0.9 % 250 mL IVPB     1,250 mg 166.7 mL/hr over 90 Minutes Intravenous  Once 01/04/16 1755 01/04/16 2100     Assessment/Plan Right breast wound/cellulitis   PMH right lumpectomy 1989              Right breast U/S 11/19: solid heterogeneous area within right breast - inflammatory vs malignant changes.             01/10/16 48mm punch biopsy performed - path pending  Plastic Surgery has been consulted regarding wound care recommendations             ID: vancomycin 11/15 >>             VTE: SCD's, last dose lovenox 11/16  Active cancer of left breast - [place], MD; non-operative management  Thrombocytopenia  PMH peritoneal carcinomatosis - currently undergoing chemotherapy (carboplatin) Hypothyroidism  Plan: operative debridement of the wound would result in a large skin defect and probably a chronic wound.  At this time I surgical intervention is not recommended. From a surgical standpoint the patient is stable for discharge with oral antibiotics and local wound care. Dr. Redmond Pulling is in communication with plastic surgery and the patient will need outpatient follow-up either with Plastic surgery or Dr. Excell Seltzer at discharge.    LOS: 7 days    Jill Alexanders , [facility] 01/11/2016, 2:12 PM Pager:  [phone] Consults: [phone] Mon-Fri 7:00 am-4:30 pm Sat-Sun 7:00 am-11:30 am

## 2016-01-11 NOTE — Progress Notes (Signed)
Completed D/C teaching with patient. Demonstrated dressing change. Consulted with CM and Home Health is set up. Gave prescriptions. Patient will be D/C home with family in stable condition.

## 2016-01-11 NOTE — Progress Notes (Signed)
Pt discharging with HHRN from [place]. Referral given to in house rep.

## 2016-01-11 NOTE — Progress Notes (Signed)
IP PROGRESS NOTE  Subjective:   [name] feels much better and clinically improved. She is able to ambulate short distances. Appetite is improving.   Objective:  Vital signs in last 24 hours: Temp:  [97.8 F (36.6 C)-98.2 F (36.8 C)] 98.2 F (36.8 C) (11/22 0503) Pulse Rate:  [76-78] 78 (11/22 0503) Resp:  [15-17] 15 (11/22 0503) BP: (111-118)/(49-65) 118/49 (11/22 0503) SpO2:  [96 %-99 %] 96 % (11/22 0503) Weight change:  Last BM Date: 01/10/16  Intake/Output from previous day: 11/21 0701 - 11/22 0700 In: 430 [P.O.:280; IV Piggyback:150] Out: -  Alert, awake woman without distress. Mouth: mucous membranes moist, pharynx normal without lesions Resp: clear to auscultation bilaterally  Chest wall examination: Open area of induration and drainage on the right side of her sternum.  Cardio: regular rate and rhythm, S1, S2 normal, no murmur, click, rub or gallop GI: soft, non-tender; bowel sounds normal; no masses,  no organomegaly Extremities: extremities normal, atraumatic, no cyanosis or edema    Lab Results:  Recent Labs  01/09/16 0441  WBC 5.1  HGB 8.4*  HCT 27.5*  PLT 64*    BMET  Recent Labs  01/09/16 0441  NA 138  K 4.5  CL 104  CO2 27  GLUCOSE 97  BUN 13  CREATININE 0.64  CALCIUM 9.2    Studies/Results: No results found.  Medications: I have reviewed the patient's current medications.  Assessment/Plan:  60 year old woman with the following issues:  1. Chest wall cellulitis: currently receiving intravenous antibiotics without any signs symptoms of sepsis. She is under evaluation for possible irrigation and debridement. Ultrasound of the left breast showed a 4.2 x 1.3 x 4.5 cm heterogeneous area within the inner left breast. This represents postsurgical changes or inflammation. Although malignancy so a possibility.  Biopsy obtained on 01/10/2016 and results are currently pending. She appears to be improving clinically with the current  therapy. If surgical therapy is required, she should be able to have that without major contraindication from an oncology standpoint.  2. History of peritoneal carcinomatosis: She is currently receiving carboplatin IV chemotherapy.  I will be on hold given her recent infection.  3. Thrombocytopenia: This has been chronic in nature related to her previous treatment and possible cirrhosis of the liver. No active bleeding noted. Platelet transfusion can be given in anticipation for surgery if needed to.    LOS: 7 days   Dianelys Scinto 01/11/2016, 7:50 AM

## 2016-01-12 DIAGNOSIS — I2510 Atherosclerotic heart disease of native coronary artery without angina pectoris: Secondary | ICD-10-CM | POA: Diagnosis not present

## 2016-01-12 DIAGNOSIS — C50912 Malignant neoplasm of unspecified site of left female breast: Secondary | ICD-10-CM | POA: Diagnosis not present

## 2016-01-12 DIAGNOSIS — T814XXA Infection following a procedure, initial encounter: Secondary | ICD-10-CM | POA: Diagnosis not present

## 2016-01-12 DIAGNOSIS — C786 Secondary malignant neoplasm of retroperitoneum and peritoneum: Secondary | ICD-10-CM | POA: Diagnosis not present

## 2016-01-12 DIAGNOSIS — F329 Major depressive disorder, single episode, unspecified: Secondary | ICD-10-CM | POA: Diagnosis not present

## 2016-01-12 DIAGNOSIS — Z853 Personal history of malignant neoplasm of breast: Secondary | ICD-10-CM | POA: Diagnosis not present

## 2016-01-12 DIAGNOSIS — N611 Abscess of the breast and nipple: Secondary | ICD-10-CM | POA: Diagnosis not present

## 2016-01-12 DIAGNOSIS — Z792 Long term (current) use of antibiotics: Secondary | ICD-10-CM | POA: Diagnosis not present

## 2016-01-12 DIAGNOSIS — D6481 Anemia due to antineoplastic chemotherapy: Secondary | ICD-10-CM | POA: Diagnosis not present

## 2016-01-13 ENCOUNTER — Ambulatory Visit: Payer: PPO

## 2016-01-13 ENCOUNTER — Other Ambulatory Visit: Payer: PPO

## 2016-01-13 ENCOUNTER — Ambulatory Visit: Payer: PPO | Admitting: Oncology

## 2016-01-14 ENCOUNTER — Ambulatory Visit: Payer: PPO

## 2016-01-15 ENCOUNTER — Encounter (HOSPITAL_COMMUNITY): Payer: Self-pay | Admitting: Emergency Medicine

## 2016-01-15 ENCOUNTER — Ambulatory Visit (HOSPITAL_COMMUNITY)
Admission: EM | Admit: 2016-01-15 | Discharge: 2016-01-15 | Disposition: A | Discharge status: Home / Self Care | Payer: PPO | Attending: Family Medicine | Admitting: Family Medicine

## 2016-01-15 DIAGNOSIS — W57XXXA Bitten or stung by nonvenomous insect and other nonvenomous arthropods, initial encounter: Secondary | ICD-10-CM | POA: Diagnosis not present

## 2016-01-15 DIAGNOSIS — L03313 Cellulitis of chest wall: Secondary | ICD-10-CM | POA: Diagnosis not present

## 2016-01-15 NOTE — ED Triage Notes (Signed)
PT reports small tick was removed from right ankle at noon. PT believes head may still be imbedded. PT currently being treated for cancer and was discharged from [facility] on Wednesday for cellulitis on her chest. PT currently taking doxycycline

## 2016-01-15 NOTE — Discharge Instructions (Signed)
Continue gently washing the wounds with soap and water.

## 2016-01-15 NOTE — ED Provider Notes (Signed)
[facility]    CSN: [id number] Arrival date & time: 01/15/16  1313     History   Chief Complaint Chief Complaint  Patient presents with  . Tick Removal    HPI [name] is a 60 y.o. female.   This is a 60 year old woman with breast cancer comes in requesting evaluation for tick bite in her right ankle. Her husband had removed the tick earlier today but felt there were still pieces of the tick embedded in the skin.  Patient's also recovering from a nasty bout of cellulitis on her chest where her lumpectomy site got infected. She was recently in the hospital.      Past Medical History:  Diagnosis Date  . ADD (attention deficit disorder)   . Allergy    seasonal  . Anxiety   . Bipolar disorder ([place])   . Breast cancer ([place]) 1989   right breast  . Cancer ([place])    Omentum  . Coronary artery disease   . Depression   . Diabetes mellitus    pt denies DM noe meds  . Hematochezia   . History of echocardiogram    a. Limited Echo 9/17: EF 60-65%, no RWMA, Gr 1 DD, trivial AI, trivial MR, GLS -12% (likely underestimated), no pericardial effusion  . Hyperlipidemia   . Hypothyroidism   . Maintenance chemotherapy    Pt has chemo every 3 weeks (on Friday)  . NSTEMI (non-ST elevated myocardial infarction) ([place]) 10/26/2015  . Osteopenia 03/2009   t score -2.1 FRAX 4.6/0.4  . Premature menopause   . Sleep apnea    mild    Patient Active Problem List   Diagnosis Date Noted  . Cellulitis of right breast 01/04/2016  . Sepsis ([place]) 01/04/2016  . GERD (gastroesophageal reflux disease) 12/09/2015  . Presence of drug coated stent in LAD coronary artery 11/08/2015  . Post-infarction angina ([place]) 11/08/2015  . Thrombocytopenia ([place]) 11/04/2015  . Coronary artery disease involving native heart without angina pectoris 11/03/2015  . NSTEMI (non-ST elevated myocardial infarction) ([place])   . Pain in the chest 10/25/2015  . Pancytopenia ([place]) 10/25/2015  . Elevated  troponin 10/25/2015  . Leukocytosis 10/25/2015  . Chest wall pain 07/15/2015  . Genetic testing 07/04/2015  . Port catheter in place 06/16/2015  . Dizziness 03/29/2015  . 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 Malignant neoplasm of female breast ([place]) 09/19/2012  . Lithium use 09/17/2011  . Abnormal coordination 09/17/2011  . Premature menopause   . Depression   . Anxiety   . Bipolar disorder ([place])   . ADD (attention deficit disorder)   . Diabetes mellitus   . NONSPECIFIC ABNORMAL ELECTROCARDIOGRAM 11/04/2009  . ALLERGIC RHINITIS 09/01/2009  . DIAB W/O COMP TYPE II/UNS NOT STATED UNCNTRL 07/28/2008  . NONSPEC ELEVATION OF LEVELS OF TRANSAMINASE/LDH 07/28/2008  . MOTOR RESTLESSNESS 02/19/2008  . Internal hemorrhoids without mention of complication [id number]  . ESOPHAGEAL STRICTURE 09/15/2007  . Obstructive sleep apnea 07/21/2007  . Hyperlipidemia 07/07/2007  . Osteopenia 02/20/2007  . Hypothyroidism 10/01/2006  . BREAST CANCER, HX OF 08/12/2006    Past Surgical History:  Procedure Laterality Date  . BREAST SURGERY  1989   RIGHT LUMPECTOMY, RADIATION AND CHEMO  . CARDIAC CATHETERIZATION N/A 10/26/2015   Procedure: Left Heart Cath and Coronary Angiography;  Surgeon: Wellington Hampshire, MD;  Location: [facility];  Service: Cardiovascular;  Laterality: N/A;  . CARDIAC CATHETERIZATION N/A 10/26/2015   Procedure: Coronary Stent Intervention;  Surgeon: Rogue Jury  Ferne Reus, MD;  Location: [facility];  Service: Cardiovascular;  Laterality: N/A;  . CARDIAC CATHETERIZATION N/A 11/08/2015   Procedure: Left Heart Cath and Coronary Angiography;  Surgeon: Jolaine Artist, MD;  Location: [facility];  Service: Cardiovascular;  Laterality: N/A;  . drug eluting stent  10/26/2015  . FOOT SURGERY  2013   BILATERAL   . HYSTEROSCOPY  2011   [place]  . PELVIC LAPAROSCOPY/ Hysteroscopy  1996    OB History    Gravida Para Term Preterm AB Living   0              SAB TAB Ectopic Multiple Live Births                   Home Medications    Prior to Admission medications   Medication Sig Start Date End Date Taking? Authorizing Provider  doxycycline (VIBRA-TABS) 100 MG tablet Take 1 tablet (100 mg total) by mouth every 12 (twelve) hours. 01/11/16  Yes Robbie Lis, MD  acetaminophen (TYLENOL) 325 MG tablet Take 2 tablets (650 mg total) by mouth every 4 (four) hours as needed for mild pain, fever or headache. 01/11/16   Robbie Lis, MD  Acetylcarnitine HCl (ACETYL L-CARNITINE PO) Take 2,000 mg by mouth at bedtime.    Historical Provider, MD  ALPRAZolam Duanne Moron) 0.5 MG tablet Take 0.5 mg by mouth 2 (two) times daily as needed for anxiety.    Historical Provider, MD  aspirin 81 MG chewable tablet Chew 1 tablet (81 mg total) by mouth daily. 10/27/15   Florencia Reasons, MD  atorvastatin (LIPITOR) 40 MG tablet Take 1 tablet (40 mg total) by mouth daily at 6 PM. 10/27/15   Florencia Reasons, MD  carvedilol (COREG) 3.125 MG tablet Take 0.5 tablets (1.56 mg total) by mouth 2 (two) times daily with a meal. 11/12/15   Erma Heritage, PA  clopidogrel (PLAVIX) 75 MG tablet Take 1 tablet (75 mg total) by mouth daily with breakfast. 10/27/15   Florencia Reasons, MD  divalproex (DEPAKOTE) 250 MG DR tablet Take by mouth. Take 500mg s in the morning and 750mg s at night    Historical Provider, MD  fluticasone (FLONASE) 50 MCG/ACT nasal spray INHALE 2 SPRAYS INTO EACH NOSTRIL EVERY NIGHT 08/25/15   Historical Provider, MD  gabapentin (NEURONTIN) 100 MG capsule Take 1 capsule (100 mg total) by mouth 3 (three) times daily. Patient taking differently: Take 200 mg by mouth 2 (two) times daily.  01/02/16   Wyatt Portela, MD  isosorbide mononitrate (IMDUR) 30 MG 24 hr tablet Take 1 tablet (30 mg total) by mouth daily. Patient taking differently: Take 15 mg by mouth daily.  11/24/15 02/22/16  Fay Records, MD  lamoTRIgine (LAMICTAL) 100 MG tablet Take 100 mg by mouth 2 (two) times daily.    Historical Provider, MD    levothyroxine (SYNTHROID, LEVOTHROID) 100 MCG tablet Take 100 mcg by mouth daily before breakfast.     Historical Provider, MD  lidocaine-prilocaine (EMLA) cream Apply topically as needed. Apply to port with every chemotherapy. 08/19/15   Owens Shark, NP  lisinopril (PRINIVIL,ZESTRIL) 2.5 MG tablet Take 1 tablet (2.5 mg total) by mouth daily. 10/27/15   Florencia Reasons, MD  loperamide (IMODIUM A-D) 2 MG tablet Take 2 mg by mouth as needed for diarrhea or loose stools.    Historical Provider, MD  loratadine (CLARITIN) 10 MG tablet Take 10 mg by mouth daily as needed for allergies (with  chemo).    Historical Provider, MD  mupirocin ointment (BACTROBAN) 2 % Apply topically 2 (two) times daily. 01/11/16   Robbie Lis, MD  nitroGLYCERIN (NITROSTAT) 0.4 MG SL tablet Place 1 tablet (0.4 mg total) under the tongue every 5 (five) minutes as needed for chest pain. 11/24/15   Fay Records, MD  ondansetron (ZOFRAN) 8 MG tablet TAKE 1 TABLET BY MOUTH EVERY 8 HOURS AS NEEDED FOR NAUSEA AND VOMITING 01/02/16   Wyatt Portela, MD  pantoprazole (PROTONIX) 40 MG tablet Take 1 tablet (40 mg total) by mouth daily. 12/05/15   Fay Records, MD  rOPINIRole (REQUIP) 0.5 MG tablet Take 1.5 mg by mouth at bedtime.     Historical Provider, MD    Family History Family History  Problem Relation Age of Onset  . Breast cancer Maternal Aunt 29  . Diabetes Maternal Grandmother   . Heart disease Maternal Grandmother   . Heart disease Maternal Grandfather   . Hypertension Paternal Grandfather   . Heart Problems Paternal Grandfather   . Breast cancer Maternal Aunt     dx. early 71s; had negative GT approx 10 years ago  . Breast cancer Maternal Aunt     dx. 51s with recurrence  . Allergies Father   . Heart disease Mother   . Other Mother     hx of hysterectomy   . Liver cancer Cousin 78    +EtOH  . Cancer Cousin     paternal 1st cousin, once-removed dx. NOS cancer (maybe ovarian) in late 30s-40s  . Cancer Cousin     female  paternal 2nd cousin d. of NOS cancer in her 20s-early 87s  . Ovarian cancer Neg Hx   . Colon cancer Neg Hx     Social History Social History  Substance Use Topics  . Smoking status: Never Smoker  . Smokeless tobacco: Never Used  . Alcohol use 0.0 oz/week     Comment: very rarely - maybe 1 glass wine every 3 mos     Allergies   Doxil [doxorubicin hcl liposomal] and Pollen extract   Review of Systems Review of Systems  Constitutional: Negative.   HENT: Negative.   Respiratory: Negative.   Cardiovascular: Negative.   Skin: Positive for rash.     Physical Exam Triage Vital Signs ED Triage Vitals  Enc Vitals Group     BP 01/15/16 1507 107/59     Pulse Rate 01/15/16 1507 75     Resp 01/15/16 1507 16     Temp 01/15/16 1507 99 F (37.2 C)     Temp Source 01/15/16 1507 Temporal     SpO2 01/15/16 1507 100 %     Weight 01/15/16 1508 163 lb (73.9 kg)     Height 01/15/16 1508 5\' 1"  (1.549 m)     Head Circumference --      Peak Flow --      Pain Score 01/15/16 1510 3     Pain Loc --      Pain Edu? --      Excl. in [place]? --    No data found.   Updated Vital Signs BP 107/59   Pulse 75   Temp 99 F (37.2 C) (Temporal)   Resp 16   Ht 5\' 1"  (1.549 m)   Wt 163 lb (73.9 kg)   SpO2 100%   BMI 30.80 kg/m       Physical Exam  Constitutional: She appears well-developed and well-nourished.  HENT:  Head: Normocephalic.  Right Ear: External ear normal.  Left Ear: External ear normal.  Mouth/Throat: Oropharynx is clear and moist.  Skin:  Patient has a 4 cm area of induration and marked erythema over her lower sternum.  The area of the tick bite on the lateral proximal ankle joint line was debrided of any foreign material.  Nursing note and vitals reviewed.    UC Treatments / Results  Labs (all labs ordered are listed, but only abnormal results are displayed) Labs Reviewed - No data to display  EKG  EKG Interpretation None       Radiology No results  found.  Procedures Procedures (including critical care time)  Medications Ordered in UC Medications - No data to display   Initial Impression / Assessment and Plan / UC Course  I have reviewed the triage vital signs and the nursing notes.  Pertinent labs & imaging results that were available during my care of the patient were reviewed by me and considered in my medical decision making (see chart for details).  Clinical Course      Final Clinical Impressions(s) / UC Diagnoses   Final diagnoses:  Tick bite, initial encounter  Cellulitis of chest wall    New Prescriptions Current Discharge Medication List    Continue the doxycycline.   Robyn Haber, MD 01/15/16 1538

## 2016-01-16 ENCOUNTER — Ambulatory Visit (HOSPITAL_COMMUNITY): Payer: PPO

## 2016-01-16 ENCOUNTER — Encounter (HOSPITAL_COMMUNITY): Payer: PPO

## 2016-01-18 ENCOUNTER — Ambulatory Visit (HOSPITAL_COMMUNITY): Payer: PPO

## 2016-01-18 ENCOUNTER — Encounter (HOSPITAL_COMMUNITY): Payer: PPO

## 2016-01-18 DIAGNOSIS — Z853 Personal history of malignant neoplasm of breast: Secondary | ICD-10-CM | POA: Diagnosis not present

## 2016-01-18 DIAGNOSIS — I2510 Atherosclerotic heart disease of native coronary artery without angina pectoris: Secondary | ICD-10-CM | POA: Diagnosis not present

## 2016-01-18 DIAGNOSIS — Z792 Long term (current) use of antibiotics: Secondary | ICD-10-CM | POA: Diagnosis not present

## 2016-01-18 DIAGNOSIS — C786 Secondary malignant neoplasm of retroperitoneum and peritoneum: Secondary | ICD-10-CM | POA: Diagnosis not present

## 2016-01-18 DIAGNOSIS — F329 Major depressive disorder, single episode, unspecified: Secondary | ICD-10-CM | POA: Diagnosis not present

## 2016-01-18 DIAGNOSIS — C50912 Malignant neoplasm of unspecified site of left female breast: Secondary | ICD-10-CM | POA: Diagnosis not present

## 2016-01-18 DIAGNOSIS — T814XXA Infection following a procedure, initial encounter: Secondary | ICD-10-CM | POA: Diagnosis not present

## 2016-01-18 DIAGNOSIS — D6481 Anemia due to antineoplastic chemotherapy: Secondary | ICD-10-CM | POA: Diagnosis not present

## 2016-01-18 DIAGNOSIS — N611 Abscess of the breast and nipple: Secondary | ICD-10-CM | POA: Diagnosis not present

## 2016-01-19 ENCOUNTER — Encounter: Payer: Self-pay | Admitting: Family Medicine

## 2016-01-19 ENCOUNTER — Ambulatory Visit (INDEPENDENT_AMBULATORY_CARE_PROVIDER_SITE_OTHER): Payer: PPO | Admitting: Family Medicine

## 2016-01-19 VITALS — BP 132/80 | HR 76 | Resp 12 | Ht 61.0 in | Wt 161.2 lb

## 2016-01-19 DIAGNOSIS — L03313 Cellulitis of chest wall: Secondary | ICD-10-CM | POA: Diagnosis not present

## 2016-01-19 DIAGNOSIS — N760 Acute vaginitis: Secondary | ICD-10-CM | POA: Diagnosis not present

## 2016-01-19 DIAGNOSIS — L0390 Cellulitis, unspecified: Secondary | ICD-10-CM | POA: Diagnosis not present

## 2016-01-19 DIAGNOSIS — L298 Other pruritus: Secondary | ICD-10-CM | POA: Diagnosis not present

## 2016-01-19 DIAGNOSIS — R739 Hyperglycemia, unspecified: Secondary | ICD-10-CM

## 2016-01-19 LAB — POCT GLYCOSYLATED HEMOGLOBIN (HGB A1C): HEMOGLOBIN A1C: 4.9

## 2016-01-19 MED ORDER — CLOTRIMAZOLE-BETAMETHASONE 1-0.05 % EX CREA: 1.0000 "application " | TOPICAL_CREAM | Freq: Two times a day (BID) | CUTANEOUS | 1 refills | Status: DC

## 2016-01-19 MED ORDER — FLUCONAZOLE 150 MG PO TABS
ORAL_TABLET | ORAL | 0 refills | Status: AC
Start: 2016-01-19 — End: 2016-01-26

## 2016-01-19 MED ORDER — FLUCONAZOLE 150 MG PO TABS: ORAL_TABLET | ORAL | 0 refills | Status: DC

## 2016-01-19 MED ORDER — MICONAZOLE NITRATE 200 MG VA SUPP: 200.0000 mg | Freq: Every day | VAGINAL | 0 refills | Status: AC

## 2016-01-19 MED ORDER — MICONAZOLE NITRATE 200 MG VA SUPP: 200.0000 mg | Freq: Every day | VAGINAL | 0 refills | Status: DC

## 2016-01-19 NOTE — Progress Notes (Signed)
Pre visit review using our clinic review tool, if applicable. No additional management support is needed unless otherwise documented below in the visit note. 

## 2016-01-19 NOTE — Progress Notes (Signed)
HPI:   [name] is a 60 y.o. female, who is here today with her husband  to follow on recent hospitalization, I saw her on 01/03/2016 because cellulitis of the chest wall, she presented to the ER because of fever and worsening erythema + fever, she was admitted from 01/04/2016 to 01/11/2016. Attempted I&D but no much purulent drainage.  She was supposed to see surgeon but has not been able to arrange appt. During hospitalization she had diagnostic mammogram right breast and Bx, negative for malignancy.  Discharged Dx chest wall cellulitis.   She is currently on Doxycycline 100 mg bid, has about 6 days left.  In general she feels better, denies any fever or chills.  Mammogram 01/12/16: No evidence of right breast abscess.Solid 4.2 x 1.3 x 4.5 cm heterogeneous area within the inner left breast, at the lumpectomy site. This is indeterminate and may represent postsurgical changes, infectious/ inflammatory changes or malignancy.  Lab Results  Component Value Date   WBC 5.1 01/09/2016   HGB 8.4 (L) 01/09/2016   HCT 27.5 (L) 01/09/2016   MCV 103.4 (H) 01/09/2016   PLT 64 (L) 01/09/2016    She also went to the ER 01/15/16 because tick bite, small, removed from right by husband. She was recommended continuing Doxycycline.   She is having [facility] service: Nurse 2 times per week, PT 2 times per week, CNA helping with her bath.  + Fatigue but progressively getting closer to her baseline. She does not need assistance with walking. Husband is helping with wound care, states that chest wall wound is greatly better.  C/O vaginal itching for the past 3-4 days, no discharge or bleeding. She has not tried OTC treatment. She denies urinary symptoms.  She also mentions a pruritic rash on left inner upper thigh, noted after hospital discharge but states that she has had it intermittently for years. She is not sure about trigger factors, usually resolves spontaneously.    She is  doing wound care herself with husband's help.  -Also she is concerned about DM 2 on her PMHx, she denies Hx of DM, she had home visit from insurance and provided education about diabetes. She tells me that years ago she had an elevated BS. She has had normal glucose whatever it has been checked, does not recall last HgA1C.    Review of Systems  Constitutional: Positive for fatigue. Negative for appetite change, chills and fever.  HENT: Negative for mouth sores, sore throat and trouble swallowing.   Respiratory: Negative for cough, shortness of breath and wheezing.   Gastrointestinal: Negative for abdominal pain, diarrhea, nausea and vomiting.  Genitourinary: Negative for decreased urine volume, dysuria, hematuria, vaginal bleeding and vaginal discharge.  Musculoskeletal: Negative for gait problem and myalgias.  Skin: Positive for rash and wound.  Neurological: Negative for syncope, weakness and headaches.  Psychiatric/Behavioral: Negative for confusion. The patient is not nervous/anxious.       Current Outpatient Prescriptions on File Prior to Visit  Medication Sig Dispense Refill  . acetaminophen (TYLENOL) 325 MG tablet Take 2 tablets (650 mg total) by mouth every 4 (four) hours as needed for mild pain, fever or headache. 30 tablet 0  . Acetylcarnitine HCl (ACETYL L-CARNITINE PO) Take 2,000 mg by mouth at bedtime.    . ALPRAZolam (XANAX) 0.5 MG tablet Take 0.5 mg by mouth 2 (two) times daily as needed for anxiety.    Marland Kitchen aspirin 81 MG chewable tablet Chew 1 tablet (81 mg  total) by mouth daily. 30 tablet 0  . atorvastatin (LIPITOR) 40 MG tablet Take 1 tablet (40 mg total) by mouth daily at 6 PM. 30 tablet 0  . carvedilol (COREG) 3.125 MG tablet Take 0.5 tablets (1.56 mg total) by mouth 2 (two) times daily with a meal. 30 tablet 3  . clopidogrel (PLAVIX) 75 MG tablet Take 1 tablet (75 mg total) by mouth daily with breakfast. 30 tablet 0  . divalproex (DEPAKOTE) 250 MG DR tablet Take by  mouth. Take 500mg s in the morning and 750mg s at night    . doxycycline (VIBRA-TABS) 100 MG tablet Take 1 tablet (100 mg total) by mouth every 12 (twelve) hours. 60 tablet 0  . fluticasone (FLONASE) 50 MCG/ACT nasal spray INHALE 2 SPRAYS INTO EACH NOSTRIL EVERY NIGHT  11  . gabapentin (NEURONTIN) 100 MG capsule Take 1 capsule (100 mg total) by mouth 3 (three) times daily. (Patient taking differently: Take 200 mg by mouth 2 (two) times daily. ) 90 capsule 2  . isosorbide mononitrate (IMDUR) 30 MG 24 hr tablet Take 1 tablet (30 mg total) by mouth daily. (Patient taking differently: Take 15 mg by mouth daily. ) 90 tablet 3  . lamoTRIgine (LAMICTAL) 100 MG tablet Take 100 mg by mouth 2 (two) times daily.    Marland Kitchen levothyroxine (SYNTHROID, LEVOTHROID) 100 MCG tablet Take 100 mcg by mouth daily before breakfast.     . lidocaine-prilocaine (EMLA) cream Apply topically as needed. Apply to port with every chemotherapy. 30 g 0  . lisinopril (PRINIVIL,ZESTRIL) 2.5 MG tablet Take 1 tablet (2.5 mg total) by mouth daily. 30 tablet 0  . loperamide (IMODIUM A-D) 2 MG tablet Take 2 mg by mouth as needed for diarrhea or loose stools.    Marland Kitchen loratadine (CLARITIN) 10 MG tablet Take 10 mg by mouth daily as needed for allergies (with chemo).    . mupirocin ointment (BACTROBAN) 2 % Apply topically 2 (two) times daily. 22 g 0  . nitroGLYCERIN (NITROSTAT) 0.4 MG SL tablet Place 1 tablet (0.4 mg total) under the tongue every 5 (five) minutes as needed for chest pain. 25 tablet 3  . ondansetron (ZOFRAN) 8 MG tablet TAKE 1 TABLET BY MOUTH EVERY 8 HOURS AS NEEDED FOR NAUSEA AND VOMITING 30 tablet 2  . pantoprazole (PROTONIX) 40 MG tablet Take 1 tablet (40 mg total) by mouth daily. 30 tablet 11  . rOPINIRole (REQUIP) 0.5 MG tablet Take 1.5 mg by mouth at bedtime.      Current Facility-Administered Medications on File Prior to Visit  Medication Dose Route Frequency Provider Last Rate Last Dose  . heparin lock flush 100 unit/mL  500  Units Intravenous Once Wyatt Portela, MD      . sodium chloride flush (NS) 0.9 % injection 10 mL  10 mL Intravenous PRN Wyatt Portela, MD         Past Medical History:  Diagnosis Date  . ADD (attention deficit disorder)   . Allergy    seasonal  . Anxiety   . Bipolar disorder ([place])   . Breast cancer ([place]) 1989   right breast  . Cancer ([place])    Omentum  . Coronary artery disease   . Depression   . Hematochezia   . History of echocardiogram    a. Limited Echo 9/17: EF 60-65%, no RWMA, Gr 1 DD, trivial AI, trivial MR, GLS -12% (likely underestimated), no pericardial effusion  . Hyperlipidemia   . Hypothyroidism   . Maintenance chemotherapy  Pt has chemo every 3 weeks (on Friday)  . NSTEMI (non-ST elevated myocardial infarction) ([place]) 10/26/2015  . Osteopenia 03/2009   t score -2.1 FRAX 4.6/0.4  . Premature menopause   . Sleep apnea    mild   Allergies  Allergen Reactions  . Doxil [Doxorubicin Hcl Liposomal] Anaphylaxis    1st Doxil.   . Pollen Extract Other (See Comments)    Pollen and grass causes a lot sneezing    Social History   Social History  . Marital status: Married    Spouse name: [name]  . Number of children: 0  . Years of education: 14   Occupational History  . unemployed [employer], [place]   Social History Main Topics  . Smoking status: Never Smoker  . Smokeless tobacco: Never Used  . Alcohol use 0.0 oz/week     Comment: very rarely - maybe 1 glass wine every 3 mos  . Drug use: No  . Sexual activity: Not Currently    Birth control/ protection: Post-menopausal     Comment: 1st intercourse 64 yo-5 partners   Other Topics Concern  . None   Social History Narrative   Lives at home with husband.    Caffeine use:  Tea/soda occass    Vitals:   01/19/16 1419  BP: 132/80  Pulse: 76  Resp: 12   Body mass index is 30.47 kg/m.    Physical Exam  Nursing note and vitals reviewed. Constitutional: She is oriented to person, place,  and time. She appears well-developed. No distress.  HENT:  Head: Atraumatic.  Mouth/Throat: Oropharynx is clear and moist and mucous membranes are normal.  Eyes: Conjunctivae are normal.  Respiratory: Effort normal and breath sounds normal. No respiratory distress.  GI: Soft. There is no tenderness.  Musculoskeletal: She exhibits no edema.  Neurological: She is alert and oriented to person, place, and time. She has normal strength. Coordination normal.  Skin: Skin is warm. No ecchymosis noted. There is erythema.     Chest wall: 5 cm, mild local heat,no tenderness,black crusts areas, erythematous raised lesions with thin skin. No active drainage.  Inner upper left thigh erythematous rounded lesions,clear center, peripheral scaly,no tender lesion, 7-8 cm  Psychiatric: She has a normal mood and affect. Her speech is normal.  Well groomed, good eye contact.      ASSESSMENT AND PLAN:     Gretchyn was seen today for hospitalization follow-up.  Diagnoses and all orders for this visit:   Cellulitis of chest wall  Slowly getting better. Complete abx treatment. Instructed about warning signs. Follow with surgeon as instructed.  -     AMB referral to wound care center  Wound cellulitis  Wound clinic evaluation recommended, high risk for complications, poor quality skin due to radiation and surgery. Continue wound care at home for now.  -     AMB referral to wound care center  Pruritic erythematous rash  Lesion on inner thigh seems to be chronic,intermittent. ? Intertrigo. Monitor for signs of infection. Topical treatment recommended as needed.  -     clotrimazole-betamethasone (LOTRISONE) cream; Apply 1 application topically 2 (two) times daily.  Vaginitis and vulvovaginitis  Empiric treatment for candida infection recommended. Some side effects of medications discussed. If symptoms do not resolve, pelvic examination will be necessary. F/U as needed.   -      fluconazole (DIFLUCAN) 150 MG tablet; 1 tab today and can repeat in 5 days. -     miconazole (MICONAZOLE 3)  200 MG vaginal suppository; Place 1 suppository (200 mg total) vaginally at bedtime.  Hyperglycemia  A1C 4.9. DM 2 deleted from PMHx.      -Ms. [name] was advised to return sooner than planned today if new concerns arise.       [name]. [name], MD  [facility]. [place] office.

## 2016-01-19 NOTE — Patient Instructions (Addendum)
A few things to remember from today's visit:   Cellulitis of chest wall - Plan: AMB referral to wound care center  Wound cellulitis - Plan: AMB referral to wound care center  Pruritic erythematous rash - Plan: clotrimazole-betamethasone (LOTRISONE) cream  Vaginitis and vulvovaginitis - Plan: fluconazole (DIFLUCAN) 150 MG tablet, miconazole (MICONAZOLE 3) 200 MG vaginal suppository  Hyperglycemia  Follow with your surgeon.  Wound clinic evaluation will be arranged.  Daily Probiotic: Align 1 cap daily.  Vaginal itching most likely related to antibiotic use, if not better you may need to follow.   A1C today 4.9      Please be sure medication list is accurate. If a new problem present, please set up appointment sooner than planned today.

## 2016-01-20 ENCOUNTER — Ambulatory Visit (HOSPITAL_COMMUNITY): Payer: PPO

## 2016-01-20 ENCOUNTER — Encounter (HOSPITAL_COMMUNITY): Payer: PPO

## 2016-01-20 ENCOUNTER — Encounter: Payer: Self-pay | Admitting: Oncology

## 2016-01-20 DIAGNOSIS — D6481 Anemia due to antineoplastic chemotherapy: Secondary | ICD-10-CM | POA: Diagnosis not present

## 2016-01-20 DIAGNOSIS — N611 Abscess of the breast and nipple: Secondary | ICD-10-CM | POA: Diagnosis not present

## 2016-01-20 DIAGNOSIS — F329 Major depressive disorder, single episode, unspecified: Secondary | ICD-10-CM | POA: Diagnosis not present

## 2016-01-20 DIAGNOSIS — C50912 Malignant neoplasm of unspecified site of left female breast: Secondary | ICD-10-CM | POA: Diagnosis not present

## 2016-01-20 DIAGNOSIS — C786 Secondary malignant neoplasm of retroperitoneum and peritoneum: Secondary | ICD-10-CM | POA: Diagnosis not present

## 2016-01-20 DIAGNOSIS — Z853 Personal history of malignant neoplasm of breast: Secondary | ICD-10-CM | POA: Diagnosis not present

## 2016-01-20 DIAGNOSIS — T814XXA Infection following a procedure, initial encounter: Secondary | ICD-10-CM | POA: Diagnosis not present

## 2016-01-20 DIAGNOSIS — Z792 Long term (current) use of antibiotics: Secondary | ICD-10-CM | POA: Diagnosis not present

## 2016-01-20 DIAGNOSIS — I2510 Atherosclerotic heart disease of native coronary artery without angina pectoris: Secondary | ICD-10-CM | POA: Diagnosis not present

## 2016-01-23 ENCOUNTER — Encounter (HOSPITAL_COMMUNITY): Payer: PPO

## 2016-01-23 ENCOUNTER — Telehealth: Payer: Self-pay | Admitting: Family Medicine

## 2016-01-23 ENCOUNTER — Ambulatory Visit (HOSPITAL_COMMUNITY): Payer: PPO

## 2016-01-23 MED ORDER — MUPIROCIN 2 % EX OINT: TOPICAL_OINTMENT | Freq: Two times a day (BID) | CUTANEOUS | 0 refills | Status: DC

## 2016-01-23 NOTE — Telephone Encounter (Signed)
° ° ° °  Pt said the hospital rx her the following med and now she need a refill .   mupirocin ointment (BACTROBAN) 2 %   [facility] [address]

## 2016-01-23 NOTE — Telephone Encounter (Signed)
It is Ok to refill topical abx, 45 g/0 Thanks, BJ

## 2016-01-23 NOTE — Telephone Encounter (Signed)
Rx sent 

## 2016-01-23 NOTE — Telephone Encounter (Signed)
Okay to refill? 

## 2016-01-24 DIAGNOSIS — I2510 Atherosclerotic heart disease of native coronary artery without angina pectoris: Secondary | ICD-10-CM | POA: Diagnosis not present

## 2016-01-24 DIAGNOSIS — D6481 Anemia due to antineoplastic chemotherapy: Secondary | ICD-10-CM | POA: Diagnosis not present

## 2016-01-24 DIAGNOSIS — Z853 Personal history of malignant neoplasm of breast: Secondary | ICD-10-CM | POA: Diagnosis not present

## 2016-01-24 DIAGNOSIS — F329 Major depressive disorder, single episode, unspecified: Secondary | ICD-10-CM | POA: Diagnosis not present

## 2016-01-24 DIAGNOSIS — T814XXA Infection following a procedure, initial encounter: Secondary | ICD-10-CM | POA: Diagnosis not present

## 2016-01-24 DIAGNOSIS — C786 Secondary malignant neoplasm of retroperitoneum and peritoneum: Secondary | ICD-10-CM | POA: Diagnosis not present

## 2016-01-24 DIAGNOSIS — N611 Abscess of the breast and nipple: Secondary | ICD-10-CM | POA: Diagnosis not present

## 2016-01-24 DIAGNOSIS — Z792 Long term (current) use of antibiotics: Secondary | ICD-10-CM | POA: Diagnosis not present

## 2016-01-24 DIAGNOSIS — C50912 Malignant neoplasm of unspecified site of left female breast: Secondary | ICD-10-CM | POA: Diagnosis not present

## 2016-01-25 ENCOUNTER — Encounter (HOSPITAL_COMMUNITY): Payer: PPO

## 2016-01-25 ENCOUNTER — Ambulatory Visit (HOSPITAL_COMMUNITY): Payer: PPO

## 2016-01-25 DIAGNOSIS — I2510 Atherosclerotic heart disease of native coronary artery without angina pectoris: Secondary | ICD-10-CM | POA: Diagnosis not present

## 2016-01-25 DIAGNOSIS — N611 Abscess of the breast and nipple: Secondary | ICD-10-CM | POA: Diagnosis not present

## 2016-01-25 DIAGNOSIS — Z792 Long term (current) use of antibiotics: Secondary | ICD-10-CM | POA: Diagnosis not present

## 2016-01-25 DIAGNOSIS — T814XXA Infection following a procedure, initial encounter: Secondary | ICD-10-CM | POA: Diagnosis not present

## 2016-01-25 DIAGNOSIS — Z853 Personal history of malignant neoplasm of breast: Secondary | ICD-10-CM | POA: Diagnosis not present

## 2016-01-25 DIAGNOSIS — F329 Major depressive disorder, single episode, unspecified: Secondary | ICD-10-CM | POA: Diagnosis not present

## 2016-01-25 DIAGNOSIS — D6481 Anemia due to antineoplastic chemotherapy: Secondary | ICD-10-CM | POA: Diagnosis not present

## 2016-01-25 DIAGNOSIS — C786 Secondary malignant neoplasm of retroperitoneum and peritoneum: Secondary | ICD-10-CM | POA: Diagnosis not present

## 2016-01-25 DIAGNOSIS — C50912 Malignant neoplasm of unspecified site of left female breast: Secondary | ICD-10-CM | POA: Diagnosis not present

## 2016-01-26 ENCOUNTER — Telehealth: Payer: Self-pay | Admitting: Family Medicine

## 2016-01-26 DIAGNOSIS — F329 Major depressive disorder, single episode, unspecified: Secondary | ICD-10-CM | POA: Diagnosis not present

## 2016-01-26 DIAGNOSIS — I2510 Atherosclerotic heart disease of native coronary artery without angina pectoris: Secondary | ICD-10-CM | POA: Diagnosis not present

## 2016-01-26 DIAGNOSIS — D6481 Anemia due to antineoplastic chemotherapy: Secondary | ICD-10-CM | POA: Diagnosis not present

## 2016-01-26 DIAGNOSIS — Z792 Long term (current) use of antibiotics: Secondary | ICD-10-CM | POA: Diagnosis not present

## 2016-01-26 DIAGNOSIS — C786 Secondary malignant neoplasm of retroperitoneum and peritoneum: Secondary | ICD-10-CM | POA: Diagnosis not present

## 2016-01-26 DIAGNOSIS — N611 Abscess of the breast and nipple: Secondary | ICD-10-CM | POA: Diagnosis not present

## 2016-01-26 DIAGNOSIS — Z853 Personal history of malignant neoplasm of breast: Secondary | ICD-10-CM | POA: Diagnosis not present

## 2016-01-26 DIAGNOSIS — C50912 Malignant neoplasm of unspecified site of left female breast: Secondary | ICD-10-CM | POA: Diagnosis not present

## 2016-01-26 DIAGNOSIS — T814XXA Infection following a procedure, initial encounter: Secondary | ICD-10-CM | POA: Diagnosis not present

## 2016-01-26 NOTE — Telephone Encounter (Signed)
See below

## 2016-01-26 NOTE — Telephone Encounter (Signed)
Pt's [facility] nurse called and stated that the pt has had vomitting after doxycycline (VIBRA-TABS) 100 MG tabletdoxycycline. No fever and wound looks 100% better.  Can pt discontinue?  Also can you authorize one more visit for the nurse to have one more visit next week.  Nurse feels more comfortable if she could have one more visit. Feliz Beam is the nurse and can be reached at [phone].

## 2016-01-26 NOTE — Telephone Encounter (Signed)
Doxycycline can be discontinued. It is Ok to have one more nurse visit as requested. Thanks, BJ

## 2016-01-27 ENCOUNTER — Encounter (HOSPITAL_COMMUNITY): Payer: PPO

## 2016-01-27 ENCOUNTER — Ambulatory Visit (HOSPITAL_COMMUNITY): Payer: PPO

## 2016-01-30 ENCOUNTER — Encounter (HOSPITAL_COMMUNITY): Payer: PPO

## 2016-01-30 ENCOUNTER — Ambulatory Visit (HOSPITAL_COMMUNITY): Payer: PPO

## 2016-01-30 DIAGNOSIS — N611 Abscess of the breast and nipple: Secondary | ICD-10-CM | POA: Diagnosis not present

## 2016-01-30 DIAGNOSIS — Z792 Long term (current) use of antibiotics: Secondary | ICD-10-CM | POA: Diagnosis not present

## 2016-01-30 DIAGNOSIS — T814XXA Infection following a procedure, initial encounter: Secondary | ICD-10-CM | POA: Diagnosis not present

## 2016-01-30 DIAGNOSIS — D6481 Anemia due to antineoplastic chemotherapy: Secondary | ICD-10-CM | POA: Diagnosis not present

## 2016-01-30 DIAGNOSIS — C50912 Malignant neoplasm of unspecified site of left female breast: Secondary | ICD-10-CM | POA: Diagnosis not present

## 2016-01-30 DIAGNOSIS — F329 Major depressive disorder, single episode, unspecified: Secondary | ICD-10-CM | POA: Diagnosis not present

## 2016-01-30 DIAGNOSIS — Z853 Personal history of malignant neoplasm of breast: Secondary | ICD-10-CM | POA: Diagnosis not present

## 2016-01-30 DIAGNOSIS — C786 Secondary malignant neoplasm of retroperitoneum and peritoneum: Secondary | ICD-10-CM | POA: Diagnosis not present

## 2016-01-30 DIAGNOSIS — I2510 Atherosclerotic heart disease of native coronary artery without angina pectoris: Secondary | ICD-10-CM | POA: Diagnosis not present

## 2016-01-30 NOTE — Telephone Encounter (Signed)
Orders given to [facility].

## 2016-01-31 ENCOUNTER — Telehealth: Payer: Self-pay | Admitting: Family Medicine

## 2016-01-31 NOTE — Telephone Encounter (Signed)
Ok to continue PT as requested. Thanks, BJ

## 2016-01-31 NOTE — Telephone Encounter (Signed)
Left voicemail with verbal orders & advised to call back with any questions.

## 2016-01-31 NOTE — Telephone Encounter (Signed)
[name] needs verbal PT order for twice a wk for next week  . Pt will be working on balancing and strengthening

## 2016-01-31 NOTE — Telephone Encounter (Signed)
Verbal okay? 

## 2016-02-01 ENCOUNTER — Encounter: Payer: Self-pay | Admitting: Family Medicine

## 2016-02-01 ENCOUNTER — Ambulatory Visit (INDEPENDENT_AMBULATORY_CARE_PROVIDER_SITE_OTHER): Payer: PPO | Admitting: Family Medicine

## 2016-02-01 ENCOUNTER — Encounter (HOSPITAL_COMMUNITY): Payer: PPO

## 2016-02-01 ENCOUNTER — Ambulatory Visit (HOSPITAL_COMMUNITY): Payer: PPO

## 2016-02-01 VITALS — BP 122/78 | HR 76 | Resp 12 | Ht 61.0 in | Wt 157.5 lb

## 2016-02-01 DIAGNOSIS — B354 Tinea corporis: Secondary | ICD-10-CM | POA: Diagnosis not present

## 2016-02-01 DIAGNOSIS — C786 Secondary malignant neoplasm of retroperitoneum and peritoneum: Secondary | ICD-10-CM | POA: Diagnosis not present

## 2016-02-01 DIAGNOSIS — Z792 Long term (current) use of antibiotics: Secondary | ICD-10-CM | POA: Diagnosis not present

## 2016-02-01 DIAGNOSIS — F329 Major depressive disorder, single episode, unspecified: Secondary | ICD-10-CM | POA: Diagnosis not present

## 2016-02-01 DIAGNOSIS — T814XXA Infection following a procedure, initial encounter: Secondary | ICD-10-CM | POA: Diagnosis not present

## 2016-02-01 DIAGNOSIS — N760 Acute vaginitis: Secondary | ICD-10-CM

## 2016-02-01 DIAGNOSIS — Z853 Personal history of malignant neoplasm of breast: Secondary | ICD-10-CM | POA: Diagnosis not present

## 2016-02-01 DIAGNOSIS — N611 Abscess of the breast and nipple: Secondary | ICD-10-CM | POA: Diagnosis not present

## 2016-02-01 DIAGNOSIS — D6481 Anemia due to antineoplastic chemotherapy: Secondary | ICD-10-CM | POA: Diagnosis not present

## 2016-02-01 DIAGNOSIS — C50912 Malignant neoplasm of unspecified site of left female breast: Secondary | ICD-10-CM | POA: Diagnosis not present

## 2016-02-01 DIAGNOSIS — I2510 Atherosclerotic heart disease of native coronary artery without angina pectoris: Secondary | ICD-10-CM | POA: Diagnosis not present

## 2016-02-01 MED ORDER — FLUCONAZOLE 150 MG PO TABS
ORAL_TABLET | ORAL | 0 refills | Status: DC
Start: 2016-02-01 — End: 2016-02-09

## 2016-02-01 MED ORDER — NYSTATIN 100000 UNIT/GM EX CREA: 1.0000 "application " | TOPICAL_CREAM | Freq: Two times a day (BID) | CUTANEOUS | 0 refills | Status: AC

## 2016-02-01 MED ORDER — NYSTATIN-TRIAMCINOLONE 100000-0.1 UNIT/GM-% EX OINT: 1.0000 "application " | TOPICAL_OINTMENT | Freq: Two times a day (BID) | CUTANEOUS | 0 refills | Status: AC

## 2016-02-01 NOTE — Patient Instructions (Signed)
  Ms.Lileigh SAUL CONRADO I have seen you today for an acute visit.  1. Vaginitis and vulvovaginitis  - fluconazole (DIFLUCAN) 150 MG tablet; 1 tab every 7 days x 3 total  Dispense: 3 tablet; Refill: 0 - nystatin-triamcinolone ointment (MYCOLOG); Apply 1 application topically 2 (two) times daily.  Dispense: 45 g; Refill: 0  2. Tinea corporis  - fluconazole (DIFLUCAN) 150 MG tablet; 1 tab every 7 days x 3 total  Dispense: 3 tablet; Refill: 0    In general please monitor for signs of worsening symptoms and seek immediate medical attention if any concerning/warning symptom.

## 2016-02-01 NOTE — Progress Notes (Signed)
Pre visit review using our clinic review tool, if applicable. No additional management support is needed unless otherwise documented below in the visit note. 

## 2016-02-01 NOTE — Progress Notes (Signed)
HPI:  [name]    genital Padilla    [name] is a 60 y.o. female, who is here today complaining of Padilla on genital area she noted yesterday, she felt burning like sensation. She has had skin lesions on upper thighs,evaluated last OV (01/19/16), improved with topical Lotrisone and oral Diflucan. She has other lesions scattered on upper extremities and buttocks.  She has not noted oral lesions, odynophagia, or dysphagia.  She denies any new medication, detergent, soap, or body product. No known insect bite or outdoor exposures to plants since her last OV; tick bite 01/15/16.  She just completed oral Doxycycline, she was treated with different Abx for chest wall cellulitis Dx 01/03/16.   OTC medication for this problem: None.    She denies vaginal discharge or urinary symptoms. "Tiny" of blood on tissue when wiping, which she believes is from "raw" skin on affected area.  She has not noted chills, fever, or worsening fatigue.    Review of Systems  Constitutional: Positive for fatigue. Negative for appetite change, chills and fever.  HENT: Negative for congestion and mouth sores.   Gastrointestinal: Negative for abdominal pain, diarrhea, nausea and vomiting.  Genitourinary: Negative for decreased urine volume, difficulty urinating, dysuria, hematuria and vaginal discharge.  Musculoskeletal: Negative for myalgias.  Skin: Positive for Padilla.  Neurological: Negative for weakness and numbness.  Hematological: Negative for adenopathy.      Current Outpatient Prescriptions on File Prior to Visit  Medication Sig Dispense Refill  . acetaminophen (TYLENOL) 325 MG tablet Take 2 tablets (650 mg total) by mouth every 4 (four) hours as needed for mild pain, fever or headache. 30 tablet 0  . Acetylcarnitine HCl (ACETYL L-CARNITINE PO) Take 2,000 mg by mouth at bedtime.    . ALPRAZolam (XANAX) 0.5 MG tablet Take 0.5 mg by  mouth 2 (two) times daily as needed for anxiety.    Marland Kitchen aspirin 81 MG chewable tablet Chew 1 tablet (81 mg total) by mouth daily. 30 tablet 0  . atorvastatin (LIPITOR) 40 MG tablet Take 1 tablet (40 mg total) by mouth daily at 6 PM. 30 tablet 0  . carvedilol (COREG) 3.125 MG tablet Take 0.5 tablets (1.56 mg total) by mouth 2 (two) times daily with a meal. 30 tablet 3  . clopidogrel (PLAVIX) 75 MG tablet Take 1 tablet (75 mg total) by mouth daily with breakfast. 30 tablet 0  . clotrimazole-betamethasone (LOTRISONE) cream Apply 1 application topically 2 (two) times daily. 30 g 1  . divalproex (DEPAKOTE) 250 MG DR tablet Take by mouth. Take 500mg s in the morning and 750mg s at night    . fluticasone (FLONASE) 50 MCG/ACT nasal spray INHALE 2 SPRAYS INTO EACH NOSTRIL EVERY NIGHT  11  . gabapentin (NEURONTIN) 100 MG capsule Take 1 capsule (100 mg total) by mouth 3 (three) times daily. (Patient taking differently: Take 200 mg by mouth 2 (two) times daily. ) 90 capsule 2  . isosorbide mononitrate (IMDUR) 30 MG 24 hr tablet Take 1 tablet (30 mg total) by mouth daily. (Patient taking differently: Take 15 mg by mouth daily. ) 90 tablet 3  . lamoTRIgine (LAMICTAL) 100 MG tablet Take 100 mg by mouth 2 (two) times daily.    Marland Kitchen levothyroxine (SYNTHROID, LEVOTHROID) 100 MCG tablet Take 100 mcg by mouth daily before breakfast.     . lidocaine-prilocaine (EMLA) cream Apply topically as needed. Apply to port with every  chemotherapy. 30 g 0  . lisinopril (PRINIVIL,ZESTRIL) 2.5 MG tablet Take 1 tablet (2.5 mg total) by mouth daily. 30 tablet 0  . loperamide (IMODIUM A-D) 2 MG tablet Take 2 mg by mouth as needed for diarrhea or loose stools.    Marland Kitchen loratadine (CLARITIN) 10 MG tablet Take 10 mg by mouth daily as needed for allergies (with chemo).    . mupirocin ointment (BACTROBAN) 2 % Apply topically 2 (two) times daily. 45 g 0  . nitroGLYCERIN (NITROSTAT) 0.4 MG SL tablet Place 1 tablet (0.4 mg total) under the tongue every 5  (five) minutes as needed for chest pain. 25 tablet 3  . ondansetron (ZOFRAN) 8 MG tablet TAKE 1 TABLET BY MOUTH EVERY 8 HOURS AS NEEDED FOR NAUSEA AND VOMITING 30 tablet 2  . pantoprazole (PROTONIX) 40 MG tablet Take 1 tablet (40 mg total) by mouth daily. 30 tablet 11  . rOPINIRole (REQUIP) 0.5 MG tablet Take 1.5 mg by mouth at bedtime.      Current Facility-Administered Medications on File Prior to Visit  Medication Dose Route Frequency Provider Last Rate Last Dose  . heparin lock flush 100 unit/mL  500 Units Intravenous Once Wyatt Portela, MD      . sodium chloride flush (NS) 0.9 % injection 10 mL  10 mL Intravenous PRN Wyatt Portela, MD         Past Medical History:  Diagnosis Date  . ADD (attention deficit disorder)   . Allergy    seasonal  . Anxiety   . Bipolar disorder ([place])   . Breast cancer ([place]) 1989   right breast  . Cancer ([place])    Omentum  . Coronary artery disease   . Depression   . Hematochezia   . History of echocardiogram    a. Limited Echo 9/17: EF 60-65%, no RWMA, Gr 1 DD, trivial AI, trivial MR, GLS -12% (likely underestimated), no pericardial effusion  . Hyperlipidemia   . Hypothyroidism   . Maintenance chemotherapy    Pt has chemo every 3 weeks (on Friday)  . NSTEMI (non-ST elevated myocardial infarction) ([place]) 10/26/2015  . Osteopenia 03/2009   t score -2.1 FRAX 4.6/0.4  . Premature menopause   . Sleep apnea    mild   Allergies  Allergen Reactions  . Doxil [Doxorubicin Hcl Liposomal] Anaphylaxis    1st Doxil.   . Pollen Extract Other (See Comments)    Pollen and grass causes a lot sneezing    Social History   Social History  . Marital status: Married    Spouse name: [name]  . Number of children: 0  . Years of education: 14   Occupational History  . unemployed [employer], [place]   Social History Main Topics  . Smoking status: Never Smoker  . Smokeless tobacco: Never Used  . Alcohol use 0.0 oz/week     Comment: very rarely -  maybe 1 glass wine every 3 mos  . Drug use: No  . Sexual activity: Not Currently    Birth control/ protection: Post-menopausal     Comment: 1st intercourse 89 yo-5 partners   Other Topics Concern  . None   Social History Narrative   Lives at home with husband.    Caffeine use:  Tea/soda occass    Vitals:   02/01/16 0817  BP: 122/78  Pulse: 76  Resp: 12   Body mass index is 29.76 kg/m.   Physical Exam  Nursing note and vitals reviewed. Constitutional: She  is oriented to person, place, and time. She appears well-developed. No distress.  HENT:  Head: Atraumatic.  Mouth/Throat: Mucous membranes are normal.  Eyes: Conjunctivae are normal.  Respiratory: Effort normal. No respiratory distress.  GI: Soft. There is no tenderness.  Genitourinary:    There is Padilla on the right labia. There is Padilla on the left labia. No tenderness in the vagina. No vaginal discharge found.  Musculoskeletal: She exhibits no edema.  Lymphadenopathy:       Right: No inguinal adenopathy present.       Left: No inguinal adenopathy present.  Neurological: She is alert and oriented to person, place, and time.  Skin: Skin is warm. Padilla noted. No abrasion noted.     Erythematous macular Padilla with clear center, perineal and vulvar, it does not involve inguinal folds, no indurated. Similar lesion on left gluteus, 7 cm, lateral aspect left hip 3 cm. No tender, no local heat.  Psychiatric: She has a normal mood and affect.  Appropriately groomed, good eye contact.      ASSESSMENT AND PLAN:     [name] was seen today for Padilla.  Diagnoses and all orders for this visit:  Vaginitis and vulvovaginitis -     fluconazole (DIFLUCAN) 150 MG tablet; 1 tab every 7 days x 3 total -     nystatin-triamcinolone ointment (MYCOLOG); Apply 1 application topically 2 (two) times daily. -     nystatin cream (MYCOSTATIN); Apply 1 application topically 2 (two) times daily.  Tinea corporis -     fluconazole  (DIFLUCAN) 150 MG tablet; 1 tab every 7 days x 3 total -     nystatin cream (MYCOSTATIN); Apply 1 application topically 2 (two) times daily.   Nystatin-Triamcinolone on perineal affected are for up to 14 days, then continue Nystatin bid until Padilla resolved + one more week. Diflucan 150 mg q 7 days x 3. Some side effects of medications discussed. OTC Desitin at night. General instructions for prevention of future fungal infections but given her Hx of cancer on chemo + recent abx use she is at high risk of recurrence. F/U as needed.     Return if symptoms worsen or fail to improve.     -[name] advised to return or notify a doctor immediately if symptoms worsen or persist or new concerns arise.       Yasseen Salls G. [name], MD  [facility]. [place] office.

## 2016-02-02 ENCOUNTER — Ambulatory Visit (HOSPITAL_BASED_OUTPATIENT_CLINIC_OR_DEPARTMENT_OTHER): Payer: PPO | Admitting: Oncology

## 2016-02-02 ENCOUNTER — Other Ambulatory Visit: Payer: Self-pay | Admitting: Student

## 2016-02-02 ENCOUNTER — Other Ambulatory Visit (HOSPITAL_BASED_OUTPATIENT_CLINIC_OR_DEPARTMENT_OTHER): Payer: PPO

## 2016-02-02 ENCOUNTER — Telehealth: Payer: Self-pay | Admitting: Oncology

## 2016-02-02 ENCOUNTER — Ambulatory Visit (HOSPITAL_BASED_OUTPATIENT_CLINIC_OR_DEPARTMENT_OTHER): Payer: PPO

## 2016-02-02 ENCOUNTER — Telehealth: Payer: Self-pay | Admitting: *Deleted

## 2016-02-02 ENCOUNTER — Ambulatory Visit: Payer: PPO

## 2016-02-02 VITALS — BP 97/48 | HR 60 | Temp 98.0°F | Resp 18 | Ht 61.0 in | Wt 158.1 lb

## 2016-02-02 DIAGNOSIS — C482 Malignant neoplasm of peritoneum, unspecified: Secondary | ICD-10-CM

## 2016-02-02 DIAGNOSIS — D696 Thrombocytopenia, unspecified: Secondary | ICD-10-CM | POA: Diagnosis not present

## 2016-02-02 DIAGNOSIS — Z95828 Presence of other vascular implants and grafts: Secondary | ICD-10-CM

## 2016-02-02 DIAGNOSIS — C50912 Malignant neoplasm of unspecified site of left female breast: Secondary | ICD-10-CM | POA: Diagnosis not present

## 2016-02-02 DIAGNOSIS — C50919 Malignant neoplasm of unspecified site of unspecified female breast: Secondary | ICD-10-CM

## 2016-02-02 DIAGNOSIS — Z5111 Encounter for antineoplastic chemotherapy: Secondary | ICD-10-CM | POA: Diagnosis not present

## 2016-02-02 LAB — COMPREHENSIVE METABOLIC PANEL
ALBUMIN: 3.1 g/dL — AB (ref 3.5–5.0)
ALT: 12 U/L (ref 0–55)
ANION GAP: 7 meq/L (ref 3–11)
AST: 16 U/L (ref 5–34)
Alkaline Phosphatase: 80 U/L (ref 40–150)
BILIRUBIN TOTAL: 0.3 mg/dL (ref 0.20–1.20)
BUN: 15.6 mg/dL (ref 7.0–26.0)
CALCIUM: 9.2 mg/dL (ref 8.4–10.4)
CO2: 26 mEq/L (ref 22–29)
CREATININE: 0.7 mg/dL (ref 0.6–1.1)
Chloride: 106 mEq/L (ref 98–109)
EGFR: >90 mL/min/{1.73_m2} (ref >90)
Glucose: 95 mg/dl (ref 70–140)
Potassium: 4.7 mEq/L (ref 3.5–5.1)
Sodium: 140 mEq/L (ref 136–145)
TOTAL PROTEIN: 7.3 g/dL (ref 6.4–8.3)

## 2016-02-02 LAB — CBC WITH DIFFERENTIAL/PLATELET
BASO%: 0.5 % (ref 0.0–2.0)
Basophils Absolute: 0 10*3/uL (ref 0.0–0.1)
EOS%: 0.9 % (ref 0.0–7.0)
Eosinophils Absolute: 0 10*3/uL (ref 0.0–0.5)
HEMATOCRIT: 32.9 % — AB (ref 34.8–46.6)
HEMOGLOBIN: 10 g/dL — AB (ref 11.6–15.9)
LYMPH#: 1.1 10*3/uL (ref 0.9–3.3)
LYMPH%: 38.6 % (ref 14.0–49.7)
MCH: 29.6 pg (ref 25.1–34.0)
MCHC: 30.2 g/dL — ABNORMAL LOW (ref 31.5–36.0)
MCV: 97.8 fL (ref 79.5–101.0)
MONO#: 0.4 10*3/uL (ref 0.1–0.9)
MONO%: 15.7 % — ABNORMAL HIGH (ref 0.0–14.0)
NEUT%: 44.3 % (ref 38.4–76.8)
NEUTROS ABS: 1.2 10*3/uL — AB (ref 1.5–6.5)
PLATELETS: 79 10*3/uL — AB (ref 145–400)
RBC: 3.37 10*6/uL — ABNORMAL LOW (ref 3.70–5.45)
RDW: 15.6 % — AB (ref 11.2–14.5)
WBC: 2.8 10*3/uL — AB (ref 3.9–10.3)

## 2016-02-02 MED ORDER — SODIUM CHLORIDE 0.9% FLUSH
10.0000 mL | INTRAVENOUS | Status: DC | PRN
  Filled 2016-02-02: qty 10

## 2016-02-02 MED ORDER — SODIUM CHLORIDE 0.9 % IJ SOLN
10.0000 mL | INTRAMUSCULAR | Status: DC | PRN
  Administered 2016-02-02: 10 mL via INTRAVENOUS
  Filled 2016-02-02: qty 10

## 2016-02-02 MED ORDER — PALONOSETRON HCL INJECTION 0.25 MG/5ML
INTRAVENOUS | Status: AC
  Filled 2016-02-02: qty 5

## 2016-02-02 MED ORDER — HEPARIN SOD (PORK) LOCK FLUSH 100 UNIT/ML IV SOLN
500.0000 [IU] | Freq: Once | INTRAVENOUS | Status: DC | PRN
  Filled 2016-02-02: qty 5

## 2016-02-02 MED ORDER — PALONOSETRON HCL INJECTION 0.25 MG/5ML
0.2500 mg | Freq: Once | INTRAVENOUS | Status: AC
  Administered 2016-02-02: 0.25 mg via INTRAVENOUS

## 2016-02-02 MED ORDER — DEXAMETHASONE SODIUM PHOSPHATE 10 MG/ML IJ SOLN
4.0000 mg | Freq: Once | INTRAMUSCULAR | Status: AC
  Administered 2016-02-02: 4 mg via INTRAVENOUS

## 2016-02-02 MED ORDER — DEXAMETHASONE SODIUM PHOSPHATE 10 MG/ML IJ SOLN
INTRAMUSCULAR | Status: AC
  Filled 2016-02-02: qty 1

## 2016-02-02 MED ORDER — CARBOPLATIN CHEMO INTRADERMAL TEST DOSE 100MCG/0.02ML
100.0000 ug | Freq: Once | INTRADERMAL | Status: AC
  Administered 2016-02-02: 100 ug via INTRADERMAL
  Filled 2016-02-02: qty 0.02

## 2016-02-02 MED ORDER — SODIUM CHLORIDE 0.9 % IV SOLN
570.0000 mg | Freq: Once | INTRAVENOUS | Status: AC
  Administered 2016-02-02: 570 mg via INTRAVENOUS
  Filled 2016-02-02: qty 57

## 2016-02-02 MED ORDER — SODIUM CHLORIDE 0.9 % IV SOLN
Freq: Once | INTRAVENOUS | Status: AC
  Administered 2016-02-02: 12:00:00 via INTRAVENOUS

## 2016-02-02 NOTE — Patient Instructions (Signed)
[facility] Discharge Instructions for Patients Receiving Chemotherapy  Today you received the following chemotherapy agents :  Carboplatin.  To help prevent nausea and vomiting after your treatment, we encourage you to take your nausea medication as prescribed.   If you develop nausea and vomiting that is not controlled by your nausea medication, call the clinic.   BELOW ARE SYMPTOMS THAT SHOULD BE REPORTED IMMEDIATELY:  *FEVER GREATER THAN 100.5 F  *CHILLS WITH OR WITHOUT FEVER  NAUSEA AND VOMITING THAT IS NOT CONTROLLED WITH YOUR NAUSEA MEDICATION  *UNUSUAL SHORTNESS OF BREATH  *UNUSUAL BRUISING OR BLEEDING  TENDERNESS IN MOUTH AND THROAT WITH OR WITHOUT PRESENCE OF ULCERS  *URINARY PROBLEMS  *BOWEL PROBLEMS  UNUSUAL RASH Items with * indicate a potential emergency and should be followed up as soon as possible.  Feel free to call the clinic you have any questions or concerns. The clinic phone number is [phone].  Please show the CHEMO ALERT CARD at check-in to the Emergency Department and triage nurse.   

## 2016-02-02 NOTE — Telephone Encounter (Signed)
Per LOS I have scheduled appts and notified the scheduler 

## 2016-02-02 NOTE — Telephone Encounter (Signed)
Message sent to chemo scheduler to be added per 02/02/16 los. Appointments scheduled per 02/02/16 los. Patient refused a copy of the AVS report and appointment schedule, per 02/02/16 los, will review on MyChart.

## 2016-02-02 NOTE — Progress Notes (Signed)
Per Dr. Alen Blew, patient okay to treat with platelets of 79 and ANC of 1.2.   Wylene Simmer, BSN, RN 02/02/2016 11:17 AM

## 2016-02-02 NOTE — Progress Notes (Signed)
Hematology and Oncology Follow Up Visit  [name] [id number] 1955/04/13 60 y.o. 02/02/2016 10:49 AM    Principle Diagnosis: 60 year old woman diagnosed with:  1. 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 This was diagnosed in July of 2014. 2. 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 The tumor is ER positive PR negative, HER-2 negative.   Prior Therapy:  She is status post paracentesis performed on 09/17/2012 with the cytology confirmed the presence of adenocarcinoma and immunohistochemical stains suggest GYN etiology. She is also status post lumpectomy for breast cancer diagnosed 25 years ago followed by radiation and chemotherapy under the care of Dr. Sonny Padilla. She did not receive any hormonal therapy. Chemotherapy utilizing carboplatin and Taxotere started on 10/01/2012. Avastin was added with cycle 4. This was discontinued in June of 2015. She is S/P Avastin maintenance only between June 2015 till March 2017. Therapy discontinued due to progression of disease. She is status post Doxil salvage chemotherapy therapy discontinued because of hypersensitivity reaction in March 2017.  Current therapy:  Carboplatin and AUC of 5 every 3 weeks cycle 1 started on 07/08/2015. She is here for cycle 10 of therapy.  Interim History: [name] presents today for a followup visit with her husband. Since her last visit, she was hospitalized for chest wall cellulitis after developing a fever and a lesion around her sternum. She was discharged on 01/11/2016 on oral antibiotics and have been following and the wound care center. She finished doxycycline and currently not taking any antibiotics. She denied any fevers, chills or recurrent infections. Her chest wall cellulitis appears to have resolved at this time.  She tolerated the last cycle of chemotherapy without major  issues. Her last cycle was 6 weeks ago and chemotherapy 3 weeks ago was withheld because of recent infection. She is ready to proceed at this time. Her appetite is improving and has not reported any dizziness or syncope. She denied any abdominal fullness or illicit satiety.  She does not report any headaches, blurry vision, syncope or seizures. She denied any fever chills. She does report some chest discomfort but no dyspnea on exertion. Has not reported any shortness of breath or cough. She denied any pain with swallowing. She does not report any vomiting, constipation does report some occasional diarrhea. She denied any GYN bleeding. She reports no anxiety or depression. Remainder of the review of systems is unremarkable.  Medications: I have reviewed the patient's current medications.  Current Outpatient Prescriptions  Medication Sig Dispense Refill  . acetaminophen (TYLENOL) 325 MG tablet Take 2 tablets (650 mg total) by mouth every 4 (four) hours as needed for mild pain, fever or headache. 30 tablet 0  . Acetylcarnitine HCl (ACETYL L-CARNITINE PO) Take 2,000 mg by mouth at bedtime.    . ALPRAZolam (XANAX) 0.5 MG tablet Take 0.5 mg by mouth 2 (two) times daily as needed for anxiety.    Marland Kitchen aspirin 81 MG chewable tablet Chew 1 tablet (81 mg total) by mouth daily. 30 tablet 0  . atorvastatin (LIPITOR) 40 MG tablet Take 1 tablet (40 mg total) by mouth daily at 6 PM. 30 tablet 0  . carvedilol (COREG) 3.125 MG tablet Take 0.5 tablets (1.56 mg total) by mouth 2 (two) times daily with a meal. 30 tablet 3  . clopidogrel (PLAVIX) 75 MG tablet Take 1 tablet (75 mg total) by mouth daily with  breakfast. 30 tablet 0  . clotrimazole-betamethasone (LOTRISONE) cream Apply 1 application topically 2 (two) times daily. 30 g 1  . divalproex (DEPAKOTE) 250 MG DR tablet Take by mouth. Take '500mg'$ s in the morning and '750mg'$ s at night    . fluconazole (DIFLUCAN) 150 MG tablet 1 tab every 7 days x 3 total 3 tablet 0  .  fluticasone (FLONASE) 50 MCG/ACT nasal spray INHALE 2 SPRAYS INTO EACH NOSTRIL EVERY NIGHT  11  . gabapentin (NEURONTIN) 100 MG capsule Take 1 capsule (100 mg total) by mouth 3 (three) times daily. (Patient taking differently: Take 200 mg by mouth 2 (two) times daily. ) 90 capsule 2  . isosorbide mononitrate (IMDUR) 30 MG 24 hr tablet Take 1 tablet (30 mg total) by mouth daily. (Patient taking differently: Take 15 mg by mouth daily. ) 90 tablet 3  . lamoTRIgine (LAMICTAL) 100 MG tablet Take 100 mg by mouth 2 (two) times daily.    Marland Kitchen levothyroxine (SYNTHROID, LEVOTHROID) 100 MCG tablet Take 100 mcg by mouth daily before breakfast.     . lidocaine-prilocaine (EMLA) cream Apply topically as needed. Apply to port with every chemotherapy. 30 g 0  . lisinopril (PRINIVIL,ZESTRIL) 2.5 MG tablet Take 1 tablet (2.5 mg total) by mouth daily. 30 tablet 0  . loperamide (IMODIUM A-D) 2 MG tablet Take 2 mg by mouth as needed for diarrhea or loose stools.    Marland Kitchen loratadine (CLARITIN) 10 MG tablet Take 10 mg by mouth daily as needed for allergies (with chemo).    . nitroGLYCERIN (NITROSTAT) 0.4 MG SL tablet Place 1 tablet (0.4 mg total) under the tongue every 5 (five) minutes as needed for chest pain. 25 tablet 3  . nystatin cream (MYCOSTATIN) Apply 1 application topically 2 (two) times daily. 45 g 0  . nystatin-triamcinolone ointment (MYCOLOG) Apply 1 application topically 2 (two) times daily. 45 g 0  . ondansetron (ZOFRAN) 8 MG tablet TAKE 1 TABLET BY MOUTH EVERY 8 HOURS AS NEEDED FOR NAUSEA AND VOMITING 30 tablet 2  . pantoprazole (PROTONIX) 40 MG tablet Take 1 tablet (40 mg total) by mouth daily. 30 tablet 11  . rOPINIRole (REQUIP) 0.5 MG tablet Take 1.5 mg by mouth at bedtime.     . mupirocin ointment (BACTROBAN) 2 % Apply topically 2 (two) times daily. 45 g 0   No current facility-administered medications for this visit.    Facility-Administered Medications Ordered in Other Visits  Medication Dose Route  Frequency Provider Last Rate Last Dose  . heparin lock flush 100 unit/mL  500 Units Intravenous Once Wyatt Portela, MD      . sodium chloride flush (NS) 0.9 % injection 10 mL  10 mL Intravenous PRN Wyatt Portela, MD         Allergies:  Allergies  Allergen Reactions  . Doxil [Doxorubicin Hcl Liposomal] Anaphylaxis    1st Doxil.   . Pollen Extract Other (See Comments)    Pollen and grass causes a lot sneezing    Past Medical History, Surgical history, Social history, and Family History were reviewed and updated.   Physical Exam: Blood pressure (!) 97/48, pulse 60, temperature 98 F (36.7 C), temperature source Oral, resp. rate 18, height '5\' 1"'$  (1.549 m), weight 158 lb 1.6 oz (71.7 kg), SpO2 100 %.  ECOG: 1 General appearance: Well-appearing woman appeared comfortable. Head: Normocephalic, without obvious abnormality. No oral thrush noted. Neck: no adenopathy Lymph nodes: Cervical, supraclavicular, and axillary nodes normal. Heart:regular rate and rhythm, S1,  S2 normal, no murmur, click, rub or gallop Lung:chest clear, no wheezing, rales, normal symmetric air entry Chest wall examination: Showed a well healed ulcer without any erythema or induration over her sternum inferior to the Port-A-Cath. Abdomin: Soft, nontender with good bowel sounds. No rebound or guarding. EXT:no erythema, induration, or nodules. No edema. Skin: No rashes or lesions. No ecchymosis.  Lab Results: Lab Results  Component Value Date   WBC 2.8 (L) 02/02/2016   HGB 10.0 (L) 02/02/2016   HCT 32.9 (L) 02/02/2016   MCV 97.8 02/02/2016   PLT 79 (L) 02/02/2016     Chemistry      Component Value Date/Time   NA 138 01/09/2016 0441   NA 140 12/22/2015 0822   K 4.5 01/09/2016 0441   K 4.5 12/22/2015 0822   CL 104 01/09/2016 0441   CO2 27 01/09/2016 0441   CO2 26 12/22/2015 0822   BUN 13 01/09/2016 0441   BUN 15.2 12/22/2015 0822   CREATININE 0.64 01/09/2016 0441   CREATININE 0.7 12/22/2015 0822       Component Value Date/Time   CALCIUM 9.2 01/09/2016 0441   CALCIUM 9.1 12/22/2015 0822   ALKPHOS 98 01/04/2016 1720   ALKPHOS 89 12/22/2015 0822   AST 15 01/04/2016 1720   AST 16 12/22/2015 0822   ALT 11 (L) 01/04/2016 1720   ALT 9 12/22/2015 0822   BILITOT 0.7 01/04/2016 1720   BILITOT 0.26 12/22/2015 0822        Results for RHEALYN, CULLEN (MRN [id number]) as of 02/02/2016 10:54  Ref. Range 12/01/2015 11:52 12/22/2015 08:22  Cancer Antigen (CA) 125 Latest Ref Range: 0.0 - 38.1 U/mL 41.4 (H) 39.1 (H)        Impression and Plan:  60 year old woman with the following issues:  1. Peritoneal carcinomatosis with omental involvement. The pathology confirmed the presence of adenocarcinoma of likely GYN etiology. Her CA 125 was elevated at 333.5 on 09/19/2012. She is status post systemic chemotherapy with an excellent response utilizing carboplatin and Taxotere and a Avastin . This therapy was held in June of 2015 and currently on Avastin maintenance.   CT scan obtained on 05/05/2015 as well as her tumor markers showed clear progression. Her CA-125 is up to 189 and her CT scan showed increase in her peritoneal disease.   She is currently receiving salvage therapy with carboplatin single agent at AUC of 5 with her tumor marker continue to respond at this time.   Her CA-125 continues to decline and response to therapy with CT scan in August 2017 showed stable disease. The plan is to continue with the same dose and schedule of chemotherapy and repeat imaging studies in January 2018.    2. IV access: Port-A-Cath is used for chemotherapy without complications.  3. Thrombocytopenia: Her platelets improved after withholding chemotherapy 3 weeks ago. No active bleeding noted.  4. Abdominal distention: No ascites noted on ultrasound unlikely related to peritoneal carcinomatosis. Clinically improving at this time.  5. Left breast mass: She status post biopsy with the pathology  indicating T2 N1 disease with the tumor have ER positive, PR negative HER-2 negative. Her Ki-67 is 50%.  The plan is to treat with systemic chemotherapy and consider salvage mastectomy if she has a good response to her ovarian cancer. Repeat mammography will be done in January 2018.  8. Neutropenia: Neulasta will be given after a cycles of therapy.  9. Chest wall cellulitis: Appears to have improved dramatically at this time. She is  no longer on antibiotics.  10. Myocardial infarction: She is receiving medical therapy and appears to be stable. She will receiving cardiac rehabilitation and continues to participate.  11. Hypertension: She is asymptomatic at this time and on very low-dose ACE inhibitor. She has follow-up with cardiology next week and any adjustments in her medication will be per Dr. Harrington Challenger.  12. The followup: She will in 3 weeks for the next cycle of therapy.    [facility], MD 12/14/201710:49 AM

## 2016-02-03 ENCOUNTER — Ambulatory Visit (HOSPITAL_BASED_OUTPATIENT_CLINIC_OR_DEPARTMENT_OTHER): Payer: PPO

## 2016-02-03 ENCOUNTER — Ambulatory Visit (HOSPITAL_COMMUNITY): Payer: PPO

## 2016-02-03 ENCOUNTER — Encounter (HOSPITAL_COMMUNITY): Payer: PPO

## 2016-02-03 VITALS — BP 130/60 | HR 76 | Temp 97.9°F | Resp 20

## 2016-02-03 DIAGNOSIS — Z792 Long term (current) use of antibiotics: Secondary | ICD-10-CM | POA: Diagnosis not present

## 2016-02-03 DIAGNOSIS — C786 Secondary malignant neoplasm of retroperitoneum and peritoneum: Secondary | ICD-10-CM | POA: Diagnosis not present

## 2016-02-03 DIAGNOSIS — Z853 Personal history of malignant neoplasm of breast: Secondary | ICD-10-CM | POA: Diagnosis not present

## 2016-02-03 DIAGNOSIS — N611 Abscess of the breast and nipple: Secondary | ICD-10-CM | POA: Diagnosis not present

## 2016-02-03 DIAGNOSIS — D6481 Anemia due to antineoplastic chemotherapy: Secondary | ICD-10-CM | POA: Diagnosis not present

## 2016-02-03 DIAGNOSIS — Z5189 Encounter for other specified aftercare: Secondary | ICD-10-CM

## 2016-02-03 DIAGNOSIS — I2510 Atherosclerotic heart disease of native coronary artery without angina pectoris: Secondary | ICD-10-CM | POA: Diagnosis not present

## 2016-02-03 DIAGNOSIS — C50912 Malignant neoplasm of unspecified site of left female breast: Secondary | ICD-10-CM | POA: Diagnosis not present

## 2016-02-03 DIAGNOSIS — C482 Malignant neoplasm of peritoneum, unspecified: Secondary | ICD-10-CM

## 2016-02-03 DIAGNOSIS — T814XXA Infection following a procedure, initial encounter: Secondary | ICD-10-CM | POA: Diagnosis not present

## 2016-02-03 DIAGNOSIS — F329 Major depressive disorder, single episode, unspecified: Secondary | ICD-10-CM | POA: Diagnosis not present

## 2016-02-03 LAB — CA 125: CANCER ANTIGEN (CA) 125: 32.4 U/mL (ref 0.0–38.1)

## 2016-02-03 MED ORDER — PEGFILGRASTIM INJECTION 6 MG/0.6ML ~~LOC~~
6.0000 mg | PREFILLED_SYRINGE | Freq: Once | SUBCUTANEOUS | Status: AC
  Administered 2016-02-03: 6 mg via SUBCUTANEOUS
  Filled 2016-02-03: qty 0.6

## 2016-02-03 NOTE — Patient Instructions (Signed)
Pegfilgrastim injection What is this medicine? PEGFILGRASTIM (PEG fil gra stim) is a long-acting granulocyte colony-stimulating factor that stimulates the growth of neutrophils, a type of white blood cell important in the body's fight against infection. It is used to reduce the incidence of fever and infection in patients with certain types of cancer who are receiving chemotherapy that affects the bone marrow, and to increase survival after being exposed to high doses of radiation. This medicine may be used for other purposes; ask your health care provider or pharmacist if you have questions. What should I tell my health care provider before I take this medicine? They need to know if you have any of these conditions: -kidney disease -latex allergy -ongoing radiation therapy -sickle cell disease -skin reactions to acrylic adhesives (On-Body Injector only) -an unusual or allergic reaction to pegfilgrastim, filgrastim, other medicines, foods, dyes, or preservatives -pregnant or trying to get pregnant -breast-feeding How should I use this medicine? This medicine is for injection under the skin. If you get this medicine at home, you will be taught how to prepare and give the pre-filled syringe or how to use the On-body Injector. Refer to the patient Instructions for Use for detailed instructions. Use exactly as directed. Take your medicine at regular intervals. Do not take your medicine more often than directed. It is important that you put your used needles and syringes in a special sharps container. Do not put them in a trash can. If you do not have a sharps container, call your pharmacist or healthcare provider to get one. Talk to your pediatrician regarding the use of this medicine in children. While this drug may be prescribed for selected conditions, precautions do apply. Overdosage: If you think you have taken too much of this medicine contact a poison control center or emergency room at  once. NOTE: This medicine is only for you. Do not share this medicine with others. What if I miss a dose? It is important not to miss your dose. Call your doctor or health care professional if you miss your dose. If you miss a dose due to an On-body Injector failure or leakage, a new dose should be administered as soon as possible using a single prefilled syringe for manual use. What may interact with this medicine? Interactions have not been studied. Give your health care provider a list of all the medicines, herbs, non-prescription drugs, or dietary supplements you use. Also tell them if you smoke, drink alcohol, or use illegal drugs. Some items may interact with your medicine. This list may not describe all possible interactions. Give your health care provider a list of all the medicines, herbs, non-prescription drugs, or dietary supplements you use. Also tell them if you smoke, drink alcohol, or use illegal drugs. Some items may interact with your medicine. What should I watch for while using this medicine? You may need blood work done while you are taking this medicine. If you are going to need a MRI, CT scan, or other procedure, tell your doctor that you are using this medicine (On-Body Injector only). What side effects may I notice from receiving this medicine? Side effects that you should report to your doctor or health care professional as soon as possible: -allergic reactions like skin rash, itching or hives, swelling of the face, lips, or tongue -dizziness -fever -pain, redness, or irritation at site where injected -pinpoint red spots on the skin -red or dark-brown urine -shortness of breath or breathing problems -stomach or side pain, or pain   at the shoulder -swelling -tiredness -trouble passing urine or change in the amount of urine Side effects that usually do not require medical attention (report to your doctor or health care professional if they continue or are  bothersome): -bone pain -muscle pain This list may not describe all possible side effects. Call your doctor for medical advice about side effects. You may report side effects to FDA at 1-800-FDA-1088. Where should I keep my medicine? Keep out of the reach of children. Store pre-filled syringes in a refrigerator between 2 and 8 degrees C (36 and 46 degrees F). Do not freeze. Keep in carton to protect from light. Throw away this medicine if it is left out of the refrigerator for more than 48 hours. Throw away any unused medicine after the expiration date. NOTE: This sheet is a summary. It may not cover all possible information. If you have questions about this medicine, talk to your doctor, pharmacist, or health care provider.    2016, Elsevier/Gold Standard. (2014-02-25 14:30:14)  

## 2016-02-06 ENCOUNTER — Ambulatory Visit (INDEPENDENT_AMBULATORY_CARE_PROVIDER_SITE_OTHER): Payer: PPO | Admitting: Internal Medicine

## 2016-02-06 ENCOUNTER — Encounter (HOSPITAL_COMMUNITY): Payer: PPO

## 2016-02-06 ENCOUNTER — Encounter (HOSPITAL_BASED_OUTPATIENT_CLINIC_OR_DEPARTMENT_OTHER): Payer: PPO | Attending: Internal Medicine

## 2016-02-06 ENCOUNTER — Encounter: Payer: Self-pay | Admitting: Internal Medicine

## 2016-02-06 ENCOUNTER — Ambulatory Visit (HOSPITAL_COMMUNITY): Payer: PPO

## 2016-02-06 VITALS — BP 108/60 | HR 81 | Ht 61.0 in | Wt 161.0 lb

## 2016-02-06 DIAGNOSIS — I25810 Atherosclerosis of coronary artery bypass graft(s) without angina pectoris: Secondary | ICD-10-CM | POA: Diagnosis not present

## 2016-02-06 DIAGNOSIS — L98498 Non-pressure chronic ulcer of skin of other sites with other specified severity: Secondary | ICD-10-CM | POA: Diagnosis not present

## 2016-02-06 DIAGNOSIS — Z923 Personal history of irradiation: Secondary | ICD-10-CM | POA: Insufficient documentation

## 2016-02-06 DIAGNOSIS — Z853 Personal history of malignant neoplasm of breast: Secondary | ICD-10-CM | POA: Insufficient documentation

## 2016-02-06 DIAGNOSIS — I252 Old myocardial infarction: Secondary | ICD-10-CM | POA: Insufficient documentation

## 2016-02-06 DIAGNOSIS — L03313 Cellulitis of chest wall: Secondary | ICD-10-CM | POA: Diagnosis not present

## 2016-02-06 DIAGNOSIS — C786 Secondary malignant neoplasm of retroperitoneum and peritoneum: Secondary | ICD-10-CM | POA: Insufficient documentation

## 2016-02-06 DIAGNOSIS — E785 Hyperlipidemia, unspecified: Secondary | ICD-10-CM

## 2016-02-06 DIAGNOSIS — Z9221 Personal history of antineoplastic chemotherapy: Secondary | ICD-10-CM | POA: Insufficient documentation

## 2016-02-06 DIAGNOSIS — I2510 Atherosclerotic heart disease of native coronary artery without angina pectoris: Secondary | ICD-10-CM | POA: Insufficient documentation

## 2016-02-06 DIAGNOSIS — L539 Erythematous condition, unspecified: Secondary | ICD-10-CM | POA: Insufficient documentation

## 2016-02-06 DIAGNOSIS — G4730 Sleep apnea, unspecified: Secondary | ICD-10-CM | POA: Insufficient documentation

## 2016-02-06 NOTE — Patient Instructions (Signed)
Your physician has recommended you make the following change in your medication:  1.) stop lisinopril  Your physician wants you to follow-up in: April, 2018 with Dr. Harrington Challenger.  You will receive a reminder letter in the mail two months in advance. If you don't receive a letter, please call our office to schedule the follow-up appointment.

## 2016-02-06 NOTE — Progress Notes (Signed)
Cardiology Office Note   Date:  02/06/2016   ID:  [name], DOB 04-02-1955, MRN [id number]  PCP:  Betty [name], MD  Cardiologist:   Dorris Carnes, MD    F/U of CAD     History of Present Illness: [name] is a 60 y.o. female with a history of 9/5-9/7 with non-STEMI. LHC demonstrated single vessel CAD with 99% distal LAD stenosis treated with Xience DES. EF was 35-45% at cardiac catheterization. Follow-up echo demonstrated normal LV function with any of [id number] and mild diastolic dysfunction. She returns for FU.  Patient seen by Kathleen Argue since  Continued to have episoded discomfort  Like admit  Was at rest  NTG relieved  Repeat cath on 9.19 showed paent LAD stent and nonobstructive CAD elsewher  D/C home on 9/19    Plan for DAPT 6 mn to a year    When I saw her in October I inreased imdur to 30  Due to chest pain  SInce seen she has done better  Infrequent CP  Cut back on imdur  Does have some dizziness  Went to cardiac rehab some  Had to stop wit hcellulitis    Current Meds  Medication Sig  . acetaminophen (TYLENOL) 325 MG tablet Take 2 tablets (650 mg total) by mouth every 4 (four) hours as needed for mild pain, fever or headache.  . Acetylcarnitine HCl (ACETYL L-CARNITINE PO) Take 2,000 mg by mouth at bedtime.  . ALPRAZolam (XANAX) 0.5 MG tablet Take 0.5 mg by mouth 2 (two) times daily as needed for anxiety.  Marland Kitchen aspirin 81 MG chewable tablet Chew 1 tablet (81 mg total) by mouth daily.  Marland Kitchen atorvastatin (LIPITOR) 40 MG tablet Take 1 tablet (40 mg total) by mouth daily at 6 PM.  . carvedilol (COREG) 3.125 MG tablet Take 1/2 tablet by mouth twice daily with meals.  . clopidogrel (PLAVIX) 75 MG tablet Take 1 tablet (75 mg total) by mouth daily with breakfast.  . clotrimazole-betamethasone (LOTRISONE) cream Apply 1 application topically 2 (two) times daily.  . divalproex (DEPAKOTE) 250 MG DR tablet Take by mouth. Take 500mg s in the morning and 750mg s at night  .  fluconazole (DIFLUCAN) 150 MG tablet 1 tab every 7 days x 3 total  . fluticasone (FLONASE) 50 MCG/ACT nasal spray INHALE 2 SPRAYS INTO EACH NOSTRIL EVERY NIGHT  . gabapentin (NEURONTIN) 100 MG capsule Take 100 mg by mouth 2 (two) times daily.  . isosorbide mononitrate (IMDUR) 15 mg TB24 24 hr tablet Take 15 mg by mouth daily.  Marland Kitchen lamoTRIgine (LAMICTAL) 100 MG tablet Take 100 mg by mouth 2 (two) times daily.  Marland Kitchen levothyroxine (SYNTHROID, LEVOTHROID) 100 MCG tablet Take 100 mcg by mouth daily before breakfast.   . lidocaine-prilocaine (EMLA) cream Apply topically as needed. Apply to port with every chemotherapy.  Marland Kitchen lisinopril (PRINIVIL,ZESTRIL) 2.5 MG tablet Take 1 tablet (2.5 mg total) by mouth daily.  Marland Kitchen loperamide (IMODIUM A-D) 2 MG tablet Take 2 mg by mouth as needed for diarrhea or loose stools.  Marland Kitchen loratadine (CLARITIN) 10 MG tablet Take 10 mg by mouth daily as needed for allergies (with chemo).  . nitroGLYCERIN (NITROSTAT) 0.4 MG SL tablet Place 1 tablet (0.4 mg total) under the tongue every 5 (five) minutes as needed for chest pain.  Marland Kitchen nystatin cream (MYCOSTATIN) Apply 1 application topically 2 (two) times daily.  Marland Kitchen nystatin-triamcinolone ointment (MYCOLOG) Apply 1 application topically 2 (two) times daily.  . ondansetron (ZOFRAN) 8 MG  tablet TAKE 1 TABLET BY MOUTH EVERY 8 HOURS AS NEEDED FOR NAUSEA AND VOMITING  . pantoprazole (PROTONIX) 40 MG tablet Take 1 tablet (40 mg total) by mouth daily.  Marland Kitchen rOPINIRole (REQUIP) 0.5 MG tablet Take 1.5 mg by mouth at bedtime.   . [DISCONTINUED] isosorbide mononitrate (IMDUR) 30 MG 24 hr tablet Take 1 tablet (30 mg total) by mouth daily. (Patient taking differently: Take 15 mg by mouth daily. )     Allergies:   Doxil [doxorubicin hcl liposomal] and Pollen extract   Past Medical History:  Diagnosis Date  . ADD (attention deficit disorder)   . Allergy    seasonal  . Anxiety   . Bipolar disorder ([place])   . Breast cancer ([place]) 1989   right breast  .  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 Coronary artery disease   . Depression   . Hematochezia   . History of echocardiogram    a. Limited Echo 9/17: EF 60-65%, no RWMA, Gr 1 DD, trivial AI, trivial MR, GLS -12% (likely underestimated), no pericardial effusion  . Hyperlipidemia   . Hypothyroidism   . Maintenance chemotherapy    Pt has chemo every 3 weeks (on Friday)  . NSTEMI (non-ST elevated myocardial infarction) ([place]) 10/26/2015  . Osteopenia 03/2009   t score -2.1 FRAX 4.6/0.4  . Premature menopause   . Sleep apnea    mild    Past Surgical History:  Procedure Laterality Date  . BREAST SURGERY  1989   RIGHT LUMPECTOMY, RADIATION AND CHEMO  . CARDIAC CATHETERIZATION N/A 10/26/2015   Procedure: Left Heart Cath and Coronary Angiography;  Surgeon: Wellington Hampshire, MD;  Location: [facility];  Service: Cardiovascular;  Laterality: N/A;  . CARDIAC CATHETERIZATION N/A 10/26/2015   Procedure: Coronary Stent Intervention;  Surgeon: Wellington Hampshire, MD;  Location: [facility];  Service: Cardiovascular;  Laterality: N/A;  . CARDIAC CATHETERIZATION N/A 11/08/2015   Procedure: Left Heart Cath and Coronary Angiography;  Surgeon: Jolaine Artist, MD;  Location: [facility];  Service: Cardiovascular;  Laterality: N/A;  . drug eluting stent  10/26/2015  . FOOT SURGERY  2013   BILATERAL   . HYSTEROSCOPY  2011   [place]  . PELVIC LAPAROSCOPY/ Hysteroscopy  1996     Social History:  The patient  reports that she has never smoked. She has never used smokeless tobacco. She reports that she drinks alcohol. She reports that she does not use drugs.   Family History:  The patient's family history includes Allergies in her father; Breast cancer in her maternal aunt and maternal aunt; Breast cancer (age of onset: 35) in her maternal aunt; Cancer in her cousin and cousin; Diabetes in her maternal grandmother; Heart Problems in her paternal grandfather; Heart disease in her maternal grandfather,  maternal grandmother, and mother; Hypertension in her paternal grandfather; Liver cancer (age of onset: 25) in her cousin; Other in her mother.    ROS:  Please see the history of present illness. All other systems are reviewed and  Negative to the above problem except as noted.    PHYSICAL EXAM: VS:  BP 108/60   Pulse 81   Ht 5\' 1"  (1.549 m)   Wt 161 lb (73 kg)   SpO2 96%   BMI 30.42 kg/m   GEN: Well nourished, well developed, in no acute distress  HEENT: normal  Neck: no JVD, carotid bruits, or masses Cardiac: RRR; no murmurs, rubs, or gallops,no edema  Respiratory:  clear to auscultation bilaterally, normal work of breathing GI: soft, nontender, nondistended, + BS  No hepatomegaly  MS: no deformity Moving all extremities   Skin: warm and dry, no rash Neuro:  Strength and sensation are intact Psych: euthymic mood, full affect   EKG:  EKG is not ordered today.   Lipid Panel    Component Value Date/Time   CHOL 161 10/26/2015 0340   TRIG 162 (H) 10/26/2015 0340   HDL 38 (L) 10/26/2015 0340   CHOLHDL 4.2 10/26/2015 0340   VLDL 32 10/26/2015 0340   LDLCALC 91 10/26/2015 0340   LDLDIRECT 151.3 08/24/2009 0803      Wt Readings from Last 3 Encounters:  02/06/16 161 lb (73 kg)  02/02/16 158 lb 1.6 oz (71.7 kg)  02/01/16 157 lb 8 oz (71.4 kg)      ASSESSMENT AND PLAN:  1  CAD  Pt doing better  Rare CP   BP is marginal and she notes some dizziness  I would recomm hlding lisinopril  Follow BP and symptosm   Keep on ASA and plavix  F/U in April  2  HL  Keep on lipitor  3  Onc  Continues on chemotherapy    F/U in clinic in April     Current medicines are reviewed at length with the patient today.  The patient does not have concerns regarding medicines.  Signed, Dorris Carnes, MD  02/06/2016 4:17 PM    [facility] [address], [place], [place]  [address] Phone: [phone]; Fax: [phone]

## 2016-02-07 ENCOUNTER — Telehealth: Payer: Self-pay | Admitting: *Deleted

## 2016-02-07 DIAGNOSIS — T814XXA Infection following a procedure, initial encounter: Secondary | ICD-10-CM | POA: Diagnosis not present

## 2016-02-07 DIAGNOSIS — F329 Major depressive disorder, single episode, unspecified: Secondary | ICD-10-CM | POA: Diagnosis not present

## 2016-02-07 DIAGNOSIS — C786 Secondary malignant neoplasm of retroperitoneum and peritoneum: Secondary | ICD-10-CM | POA: Diagnosis not present

## 2016-02-07 DIAGNOSIS — N611 Abscess of the breast and nipple: Secondary | ICD-10-CM | POA: Diagnosis not present

## 2016-02-07 DIAGNOSIS — C50912 Malignant neoplasm of unspecified site of left female breast: Secondary | ICD-10-CM | POA: Diagnosis not present

## 2016-02-07 DIAGNOSIS — Z792 Long term (current) use of antibiotics: Secondary | ICD-10-CM | POA: Diagnosis not present

## 2016-02-07 DIAGNOSIS — D6481 Anemia due to antineoplastic chemotherapy: Secondary | ICD-10-CM | POA: Diagnosis not present

## 2016-02-07 DIAGNOSIS — I2510 Atherosclerotic heart disease of native coronary artery without angina pectoris: Secondary | ICD-10-CM | POA: Diagnosis not present

## 2016-02-07 DIAGNOSIS — Z853 Personal history of malignant neoplasm of breast: Secondary | ICD-10-CM | POA: Diagnosis not present

## 2016-02-08 ENCOUNTER — Telehealth: Payer: Self-pay | Admitting: Family Medicine

## 2016-02-08 ENCOUNTER — Encounter (HOSPITAL_COMMUNITY): Payer: PPO

## 2016-02-08 ENCOUNTER — Other Ambulatory Visit: Payer: Self-pay | Admitting: *Deleted

## 2016-02-08 ENCOUNTER — Ambulatory Visit (HOSPITAL_COMMUNITY): Payer: PPO

## 2016-02-08 DIAGNOSIS — Z853 Personal history of malignant neoplasm of breast: Secondary | ICD-10-CM | POA: Diagnosis not present

## 2016-02-08 DIAGNOSIS — F329 Major depressive disorder, single episode, unspecified: Secondary | ICD-10-CM | POA: Diagnosis not present

## 2016-02-08 DIAGNOSIS — T814XXA Infection following a procedure, initial encounter: Secondary | ICD-10-CM | POA: Diagnosis not present

## 2016-02-08 DIAGNOSIS — D6481 Anemia due to antineoplastic chemotherapy: Secondary | ICD-10-CM | POA: Diagnosis not present

## 2016-02-08 DIAGNOSIS — Z792 Long term (current) use of antibiotics: Secondary | ICD-10-CM | POA: Diagnosis not present

## 2016-02-08 DIAGNOSIS — I2510 Atherosclerotic heart disease of native coronary artery without angina pectoris: Secondary | ICD-10-CM | POA: Diagnosis not present

## 2016-02-08 DIAGNOSIS — C50912 Malignant neoplasm of unspecified site of left female breast: Secondary | ICD-10-CM | POA: Diagnosis not present

## 2016-02-08 DIAGNOSIS — N611 Abscess of the breast and nipple: Secondary | ICD-10-CM | POA: Diagnosis not present

## 2016-02-08 DIAGNOSIS — C786 Secondary malignant neoplasm of retroperitoneum and peritoneum: Secondary | ICD-10-CM | POA: Diagnosis not present

## 2016-02-08 MED ORDER — GABAPENTIN 100 MG PO CAPS: 100.0000 mg | ORAL_CAPSULE | Freq: Two times a day (BID) | ORAL | 0 refills | Status: DC

## 2016-02-08 MED ORDER — ISOSORBIDE MONONITRATE ER 30 MG PO TB24: 15.0000 mg | ORAL_TABLET | Freq: Every day | ORAL | 11 refills | Status: DC

## 2016-02-08 NOTE — Telephone Encounter (Signed)
Spoke with [employer]. This prescription was sent in originally by [employer]  Pt takes 15 mg daily  Verified it is ok to split tablet to take 1/2.  Prescription reordered to reflect correct directions, since it does not come in 15 mg tablet.

## 2016-02-08 NOTE — Telephone Encounter (Signed)
Noted  

## 2016-02-08 NOTE — Telephone Encounter (Signed)
Patient Name: [name]  DOB: May 18, 1955    Initial Comment Caller stopped med, per doctor, that may have been causing contact dermatitis. Using hydrocortisone cream instead, but now wound is sore.    Nurse Assessment  Nurse: Thad Ranger RN, Langley Gauss Date/Time (Eastern Time): 02/08/2016 3:31:44 PM  Confirm and document reason for call. If symptomatic, describe symptoms. ---Caller stopped use of Bactroban and use of dressings on a chest wound per orders of a wound care MD she saw on Monday. States the wound is more red and painful. No drainage or fever. Wound r/t Cellulitis.  Does the patient have any new or worsening symptoms? ---Yes  Will a triage be completed? ---Yes  Related visit to physician within the last 2 weeks? ---Yes  Does the PT have any chronic conditions? (i.e. diabetes, asthma, etc.) ---Yes  List chronic conditions. 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  Is this a behavioral health or substance abuse call? ---No     Guidelines    Guideline Title Affirmed Question Affirmed Notes  Wound Infection [1] Red area or streak AND [2] no fever    Final Disposition User   See Physician within 24 Hours Carmon, RN, Langley Gauss    Comments  Appt made with Dr Betty [name] on 02/09/16 at 1515. Pt aware/agreeable.  Advised to continue wound care a/o by wound care MD and keep the site clean/dry. States she is cleansing the site with Dial soap a/o by MD.   Referrals  REFERRED TO PCP OFFICE   Disagree/Comply: Comply

## 2016-02-09 ENCOUNTER — Encounter: Payer: Self-pay | Admitting: Family Medicine

## 2016-02-09 ENCOUNTER — Encounter: Payer: Self-pay | Admitting: Oncology

## 2016-02-09 ENCOUNTER — Ambulatory Visit (INDEPENDENT_AMBULATORY_CARE_PROVIDER_SITE_OTHER): Payer: PPO | Admitting: Family Medicine

## 2016-02-09 VITALS — BP 130/80 | HR 91 | Resp 12 | Ht 61.0 in | Wt 160.5 lb

## 2016-02-09 DIAGNOSIS — R112 Nausea with vomiting, unspecified: Secondary | ICD-10-CM

## 2016-02-09 DIAGNOSIS — Z792 Long term (current) use of antibiotics: Secondary | ICD-10-CM | POA: Diagnosis not present

## 2016-02-09 DIAGNOSIS — T814XXA Infection following a procedure, initial encounter: Secondary | ICD-10-CM | POA: Diagnosis not present

## 2016-02-09 DIAGNOSIS — C50912 Malignant neoplasm of unspecified site of left female breast: Secondary | ICD-10-CM | POA: Diagnosis not present

## 2016-02-09 DIAGNOSIS — D6481 Anemia due to antineoplastic chemotherapy: Secondary | ICD-10-CM | POA: Diagnosis not present

## 2016-02-09 DIAGNOSIS — N611 Abscess of the breast and nipple: Secondary | ICD-10-CM | POA: Diagnosis not present

## 2016-02-09 DIAGNOSIS — Z853 Personal history of malignant neoplasm of breast: Secondary | ICD-10-CM | POA: Diagnosis not present

## 2016-02-09 DIAGNOSIS — C786 Secondary malignant neoplasm of retroperitoneum and peritoneum: Secondary | ICD-10-CM | POA: Diagnosis not present

## 2016-02-09 DIAGNOSIS — I2510 Atherosclerotic heart disease of native coronary artery without angina pectoris: Secondary | ICD-10-CM

## 2016-02-09 DIAGNOSIS — R234 Changes in skin texture: Secondary | ICD-10-CM

## 2016-02-09 DIAGNOSIS — R42 Dizziness and giddiness: Secondary | ICD-10-CM

## 2016-02-09 DIAGNOSIS — F329 Major depressive disorder, single episode, unspecified: Secondary | ICD-10-CM | POA: Diagnosis not present

## 2016-02-09 NOTE — Patient Instructions (Addendum)
A few things to remember from today's visit:   Dizziness  Coronary artery disease involving native coronary artery of native heart without angina pectoris  Surgery. Monitor wound on chest. Monitoring blood pressure. FALL PRECAUTIONS   Please be sure medication list is accurate. If a new problem present, please set up appointment sooner than planned today.

## 2016-02-09 NOTE — Progress Notes (Addendum)
HPI:  ACUTE VISIT:  Chief Complaint  Patient presents with  . Cellulitis    [name] is a 60 y.o. female, who is here today complaining of possible recurrence chest wall cellulitis. She was recently (01/04/16) admitted to hospital because cellulitis right inner breast, she was treated on oral Doxycycline, which she completed. She states that lesion looked much better until last night.  She was following with wound clinic and according to pt, prescribed topical Hydrocortisone to help with pruritus of lesion that seemed to be healing well. She states that after using cream 2-3 times she noted more erythema on area, no drainage or worsening tenderness. She has not noted fever or chills.   Dx mammogram during hospitalization showed no abscess, post surgical changes after left lumpectomy. Pathology from Bx right breast revealed mixed inflammatory changes with no definitive malignance.  She was supposed to follow with surgeon but she cancelled appt yesterday because she did not feel like it was needed.   She started Hydroco cream 2 days ago and since then she noted lesion getting "sore", no bad pain, intermittently, redder. She was recommended to continue following with wound clinic as needed.  No fever no chill, no myalgias, no drainage.  -She is also concerned about nausea and vomiting. According to pt, she has had these symptoms (+dizziness) since chemotherapy was started but worse since medications were added by cardiologists after her MI: Aspirin, Nitro,Imdur, and Coreg.  She has had 2 episodes of vomiting total since symptoms have worsened. She has not noted abdominal pain or changes in bowel habits.   She is reporting elevated BP at home, 130's/90's,she also tells me that physiotherapists checked BP lying down and standing up and told her she had orthostatic hypotension. She saw her cardiologists 02/06/16 and according to pt, after she mentioned she was having  dizziness, Lisinopril was discontinued. She states that she has not been taking her medications as instructed. She is supposed to take Imdur 30 mg 1/2 tab and took one tab instead today. She is also on Coreg 3.125 mg 1/2 tab bid.  She states that last night dizziness was worse, she felt like she was going to pass out. She denies associated chest pain, dyspnea, or diaphoresis. She tells me that she had chest pain, mild a few days ago while she was in bed, took Nitro and eventually went away. No associated symptoms. Dizziness usually exacerbated by standing up.  She is supposed to follow with cardiologists in 05/2016.    Review of Systems  Constitutional: Positive for fatigue. Negative for chills and fever.  HENT: Negative for mouth sores, sore throat and trouble swallowing.   Respiratory: Negative for cough, shortness of breath and wheezing.   Cardiovascular: Negative for palpitations and leg swelling.  Gastrointestinal: Positive for nausea and vomiting. Negative for abdominal pain.       No changes in bowel habits.  Genitourinary: Negative for decreased urine volume, difficulty urinating and hematuria.  Skin: Positive for color change. Negative for rash.  Neurological: Positive for dizziness. Negative for syncope, weakness and headaches.  Psychiatric/Behavioral: Negative for confusion. The patient is nervous/anxious.       Current Outpatient Prescriptions on File Prior to Visit  Medication Sig Dispense Refill  . acetaminophen (TYLENOL) 325 MG tablet Take 2 tablets (650 mg total) by mouth every 4 (four) hours as needed for mild pain, fever or headache. 30 tablet 0  . Acetylcarnitine HCl (ACETYL L-CARNITINE PO) Take 2,000 mg by  mouth at bedtime.    . ALPRAZolam (XANAX) 0.5 MG tablet Take 0.5 mg by mouth 2 (two) times daily as needed for anxiety.    Marland Kitchen aspirin 81 MG chewable tablet Chew 1 tablet (81 mg total) by mouth daily. 30 tablet 0  . atorvastatin (LIPITOR) 40 MG tablet Take 1 tablet  (40 mg total) by mouth daily at 6 PM. 30 tablet 0  . carvedilol (COREG) 3.125 MG tablet Take 1/2 tablet by mouth twice daily with meals. 30 tablet 9  . clopidogrel (PLAVIX) 75 MG tablet Take 1 tablet (75 mg total) by mouth daily with breakfast. 30 tablet 0  . clotrimazole-betamethasone (LOTRISONE) cream Apply 1 application topically 2 (two) times daily. 30 g 1  . divalproex (DEPAKOTE) 250 MG DR tablet Take by mouth. Take 500mg s in the morning and 750mg s at night    . fluticasone (FLONASE) 50 MCG/ACT nasal spray INHALE 2 SPRAYS INTO EACH NOSTRIL EVERY NIGHT  11  . gabapentin (NEURONTIN) 100 MG capsule Take 1 capsule (100 mg total) by mouth 2 (two) times daily. 180 capsule 0  . lamoTRIgine (LAMICTAL) 100 MG tablet Take 100 mg by mouth 2 (two) times daily.    Marland Kitchen levothyroxine (SYNTHROID, LEVOTHROID) 100 MCG tablet Take 100 mcg by mouth daily before breakfast.     . lidocaine-prilocaine (EMLA) cream Apply topically as needed. Apply to port with every chemotherapy. 30 g 0  . loperamide (IMODIUM A-D) 2 MG tablet Take 2 mg by mouth as needed for diarrhea or loose stools.    Marland Kitchen loratadine (CLARITIN) 10 MG tablet Take 10 mg by mouth daily as needed for allergies (with chemo).    . nitroGLYCERIN (NITROSTAT) 0.4 MG SL tablet Place 1 tablet (0.4 mg total) under the tongue every 5 (five) minutes as needed for chest pain. 25 tablet 3  . nystatin cream (MYCOSTATIN) Apply 1 application topically 2 (two) times daily. 45 g 0  . nystatin-triamcinolone ointment (MYCOLOG) Apply 1 application topically 2 (two) times daily. 45 g 0  . ondansetron (ZOFRAN) 8 MG tablet TAKE 1 TABLET BY MOUTH EVERY 8 HOURS AS NEEDED FOR NAUSEA AND VOMITING 30 tablet 2  . pantoprazole (PROTONIX) 40 MG tablet Take 1 tablet (40 mg total) by mouth daily. 30 tablet 11  . rOPINIRole (REQUIP) 0.5 MG tablet Take 1.5 mg by mouth at bedtime.      Current Facility-Administered Medications on File Prior to Visit  Medication Dose Route Frequency Provider  Last Rate Last Dose  . heparin lock flush 100 unit/mL  500 Units Intravenous Once Wyatt Portela, MD      . sodium chloride flush (NS) 0.9 % injection 10 mL  10 mL Intravenous PRN Wyatt Portela, MD         Past Medical History:  Diagnosis Date  . ADD (attention deficit disorder)   . Allergy    seasonal  . Anxiety   . Bipolar disorder ([place])   . Breast cancer ([place]) 1989   right breast  . 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 Coronary artery disease   . Depression   . Hematochezia   . History of echocardiogram    a. Limited Echo 9/17: EF 60-65%, no RWMA, Gr 1 DD, trivial AI, trivial MR, GLS -12% (likely underestimated), no pericardial effusion  . Hyperlipidemia   . Hypothyroidism   . Maintenance chemotherapy    Pt has chemo every 3 weeks (on Friday)  . NSTEMI (non-ST elevated myocardial infarction) ([place]) 10/26/2015  .  Osteopenia 03/2009   t score -2.1 FRAX 4.6/0.4  . Premature menopause   . Sleep apnea    mild   Allergies  Allergen Reactions  . Doxil [Doxorubicin Hcl Liposomal] Anaphylaxis    1st Doxil.   . Pollen Extract Other (See Comments)    Pollen and grass causes a lot sneezing    Social History   Social History  . Marital status: Married    Spouse name: [name]  . Number of children: 0  . Years of education: 14   Occupational History  . unemployed [employer], [place]   Social History Main Topics  . Smoking status: Never Smoker  . Smokeless tobacco: Never Used  . Alcohol use 0.0 oz/week     Comment: very rarely - maybe 1 glass wine every 3 mos  . Drug use: No  . Sexual activity: Not Currently    Birth control/ protection: Post-menopausal     Comment: 1st intercourse 26 yo-5 partners   Other Topics Concern  . None   Social History Narrative   Lives at home with husband.    Caffeine use:  Tea/soda occass    Vitals:   02/09/16 1518  BP: 130/80  Pulse: 91  Resp: 12    O2 sat at RA 98%  Body mass index is 30.33 kg/m.   Physical Exam    Nursing note and vitals reviewed. Constitutional: She is oriented to person, place, and time. She appears well-developed. She does not appear ill. No distress.  HENT:  Head: Atraumatic.  Mouth/Throat: Oropharynx is clear and moist and mucous membranes are normal.  Eyes: Conjunctivae are normal.  Cardiovascular: Normal rate.  An irregular rhythm present.  Occasional extrasystoles (X 2 in a minute) are present.  No murmur heard. Respiratory: Effort normal and breath sounds normal. No respiratory distress.  GI: Soft. Bowel sounds are normal. She exhibits no distension and no mass. There is no hepatomegaly. There is no tenderness.  Musculoskeletal: She exhibits no edema.  Lymphadenopathy:    She has no cervical adenopathy.  Neurological: She is alert and oriented to person, place, and time. She has normal strength.  Skin: Skin is warm. There is erythema.     Left upper chest inner upper aspect of right breast with thin skin, scarring changed, and pigmentation post inflammatory changes. No local heat, minimal erythema, no tenderness.  Psychiatric: Her mood appears anxious.  Well groomed, good eye contact.      ASSESSMENT AND PLAN:     [name] was seen today for cellulitis.  Diagnoses and all orders for this visit:   Nausea and vomiting in adult  Seems to be chronic. We discussed possible causes: chronic medical conditions (peritoneal carcinomatosis,GERD) and medications among some. Continue Zofran 8 mg tid as needed. Adequate hydration and small, portions of bland food as tolerated.  Dizziness  Also seems to be going on for a few months. Fall precautions discussed. BP 100/60 when re-checked today. Adequate hydration. Recommended stopping Imdur 15 mg. Continue monitoring BP at home. F/U in 1-2 weeks.   Coronary artery disease involving native coronary artery of native heart without angina pectoris  Continue Coreg 3.125 mg 1/2 tab bid but is BP still low and she is  still having dizziness we need to wean off medication. We discussed benefits of treatment as well as side effects of medications she is currently taking. She is not symptomatic at this time but reporting chest discomfort a few days ago at rest. BB and CCB  could be causing or aggravating dizziness.  We discussed finding on examination, mildly irregular, I did not recommend doing EKG today,I do not think it will change my  treatment today. She voices understanding and agrees with plan. Continue following with Dr Harrington Challenger, cardiologists. Instructed about warning signs.   Breast skin changes No clear signs of cellulitis, skin changes could be related to scarring and post inflammatory changes. Actually lesions looks greatly improved since her last OV. She was instructed to re-schedule appt with surgery to discuss possible options. Skin of inner right breast is thin, scarring changes extend to right side of sternum, if surgery is performed healing may take time and she may need skin graft. Recommended topical Mupirocin, she still has Rx, bid and continue monitoring.     Return in about 10 days (around 02/19/2016) for HTN,dizziness.   > 40 min face to face OV, > 50% of the time was dedicated to discussion of some of her chronic medical problems in detail, side effects of medications, benefit Vs risk of some of her medications as well as prognosis.    -[name] was advised to return or notify a doctor immediately if symptoms worsen or new concerns arise.       Anjoli Diemer G. [name], MD  [facility]. [place] office.

## 2016-02-09 NOTE — Progress Notes (Signed)
Pre visit review using our clinic review tool, if applicable. No additional management support is needed unless otherwise documented below in the visit note. 

## 2016-02-10 ENCOUNTER — Telehealth: Payer: Self-pay | Admitting: Family Medicine

## 2016-02-10 ENCOUNTER — Encounter (HOSPITAL_COMMUNITY): Payer: PPO

## 2016-02-10 ENCOUNTER — Ambulatory Visit (HOSPITAL_COMMUNITY): Payer: PPO

## 2016-02-10 DIAGNOSIS — S21101S Unspecified open wound of right front wall of thorax without penetration into thoracic cavity, sequela: Secondary | ICD-10-CM | POA: Diagnosis not present

## 2016-02-10 NOTE — Telephone Encounter (Signed)
Please Advise [name] this is a call from the patient you just spoke to is there anything that I can do?

## 2016-02-10 NOTE — Telephone Encounter (Signed)
Patient Name: [name]  DOB: 08-Sep-1955    Initial Comment Caller says Dr. [name] told her yesterday to discontinue the Isosorbide monotrate, but that is not written on the instructions. Also wants to know if she should take one more dose of another med    Nurse Assessment  Nurse: Mallie Mussel, RN, Alveta Heimlich Date/Time Eilene Ghazi Time): 02/10/2016 10:04:04 AM  Confirm and document reason for call. If symptomatic, describe symptoms. ---Caller states that she was told to d/c Isosorbide Mononitrate and she was expecting a medication called in for UTI. The instructions she received did not have to d/c the Isosorbide. Pharmacy does not have RX for UTI med. I called the office backline and did a warm transfer to [place] for further assistance.  Does the patient have any new or worsening symptoms? ---No     Guidelines    Guideline Title Affirmed Question Affirmed Notes       Final Disposition User   Clinical Call Mallie Mussel, RN, Alveta Heimlich

## 2016-02-10 NOTE — Telephone Encounter (Signed)
Called and spoke with patient. Relayed message below and patient verbalized understanding.

## 2016-02-10 NOTE — Telephone Encounter (Signed)
See other phone note

## 2016-02-10 NOTE — Telephone Encounter (Signed)
Called and spoke with patient. Patient said that she was verbally told by Dr. [name] to discontinue the isosorbide. Dr. [name] is out of office, so I spoke with Tommi Rumps about the situation. He said for patient to monitor her blood pressure and follow the recommendations given by Dr. [name] and that we will see her at her follow up appointment. Patient was also advised to call back if her blood pressure started to go up higher than usual or if she started feeling bad. Patient verbalized understanding.

## 2016-02-10 NOTE — Telephone Encounter (Signed)
Imdur was discontinued yesterday because dizziness, orthostatic hypotension.  Thanks, BJ

## 2016-02-10 NOTE — Telephone Encounter (Signed)
Pt was seen yesterday and has question concerning bp med. Pt was transfer to [facility]. Pt is aware md out of office

## 2016-02-15 ENCOUNTER — Encounter (HOSPITAL_COMMUNITY): Payer: PPO

## 2016-02-15 ENCOUNTER — Ambulatory Visit (HOSPITAL_COMMUNITY): Payer: PPO

## 2016-02-17 ENCOUNTER — Encounter (HOSPITAL_COMMUNITY): Payer: Self-pay

## 2016-02-17 ENCOUNTER — Ambulatory Visit (HOSPITAL_COMMUNITY): Payer: PPO

## 2016-02-17 ENCOUNTER — Encounter (HOSPITAL_COMMUNITY): Payer: PPO

## 2016-02-17 ENCOUNTER — Ambulatory Visit (HOSPITAL_COMMUNITY)
Admission: RE | Admit: 2016-02-17 | Discharge: 2016-02-17 | Disposition: A | Discharge status: Home / Self Care | Payer: PPO | Source: Ambulatory Visit | Attending: Oncology | Admitting: Oncology

## 2016-02-17 DIAGNOSIS — I700 Atherosclerosis of aorta: Secondary | ICD-10-CM | POA: Diagnosis not present

## 2016-02-17 DIAGNOSIS — C482 Malignant neoplasm of peritoneum, unspecified: Secondary | ICD-10-CM | POA: Diagnosis not present

## 2016-02-17 DIAGNOSIS — R188 Other ascites: Secondary | ICD-10-CM | POA: Diagnosis not present

## 2016-02-17 MED ORDER — IOPAMIDOL (ISOVUE-300) INJECTION 61%
100.0000 mL | Freq: Once | INTRAVENOUS | Status: AC | PRN
  Administered 2016-02-17: 100 mL via INTRAVENOUS

## 2016-02-17 MED ORDER — HEPARIN SOD (PORK) LOCK FLUSH 100 UNIT/ML IV SOLN
INTRAVENOUS | Status: AC
  Filled 2016-02-17: qty 5

## 2016-02-17 MED ORDER — IOPAMIDOL (ISOVUE-370) INJECTION 76%
INTRAVENOUS | Status: AC
  Filled 2016-02-17: qty 100

## 2016-02-22 ENCOUNTER — Encounter (HOSPITAL_COMMUNITY): Payer: PPO

## 2016-02-22 ENCOUNTER — Ambulatory Visit (HOSPITAL_COMMUNITY): Payer: PPO

## 2016-02-22 DIAGNOSIS — F3131 Bipolar disorder, current episode depressed, mild: Secondary | ICD-10-CM | POA: Diagnosis not present

## 2016-02-23 ENCOUNTER — Ambulatory Visit (HOSPITAL_BASED_OUTPATIENT_CLINIC_OR_DEPARTMENT_OTHER): Payer: PPO | Admitting: Oncology

## 2016-02-23 ENCOUNTER — Telehealth: Payer: Self-pay | Admitting: Oncology

## 2016-02-23 ENCOUNTER — Ambulatory Visit (HOSPITAL_BASED_OUTPATIENT_CLINIC_OR_DEPARTMENT_OTHER): Payer: PPO

## 2016-02-23 ENCOUNTER — Other Ambulatory Visit (HOSPITAL_BASED_OUTPATIENT_CLINIC_OR_DEPARTMENT_OTHER): Payer: PPO

## 2016-02-23 ENCOUNTER — Telehealth: Payer: Self-pay | Admitting: *Deleted

## 2016-02-23 ENCOUNTER — Ambulatory Visit: Payer: PPO

## 2016-02-23 VITALS — BP 124/64 | HR 72 | Temp 98.0°F | Resp 18 | Ht 61.0 in | Wt 160.3 lb

## 2016-02-23 DIAGNOSIS — Z5111 Encounter for antineoplastic chemotherapy: Secondary | ICD-10-CM

## 2016-02-23 DIAGNOSIS — C482 Malignant neoplasm of peritoneum, unspecified: Secondary | ICD-10-CM

## 2016-02-23 DIAGNOSIS — C50919 Malignant neoplasm of unspecified site of unspecified female breast: Secondary | ICD-10-CM

## 2016-02-23 DIAGNOSIS — C50412 Malignant neoplasm of upper-outer quadrant of left female breast: Secondary | ICD-10-CM

## 2016-02-23 DIAGNOSIS — Z170 Estrogen receptor positive status [ER+]: Secondary | ICD-10-CM | POA: Diagnosis not present

## 2016-02-23 DIAGNOSIS — Z95828 Presence of other vascular implants and grafts: Secondary | ICD-10-CM

## 2016-02-23 LAB — COMPREHENSIVE METABOLIC PANEL
ALT: 8 U/L (ref 0–55)
AST: 13 U/L (ref 5–34)
Albumin: 3.4 g/dL — ABNORMAL LOW (ref 3.5–5.0)
Alkaline Phosphatase: 92 U/L (ref 40–150)
Anion Gap: 8 mEq/L (ref 3–11)
BUN: 9.8 mg/dL (ref 7.0–26.0)
CHLORIDE: 109 meq/L (ref 98–109)
CO2: 26 meq/L (ref 22–29)
CREATININE: 0.7 mg/dL (ref 0.6–1.1)
Calcium: 9.3 mg/dL (ref 8.4–10.4)
EGFR: >90 mL/min/{1.73_m2} (ref >90)
GLUCOSE: 93 mg/dL (ref 70–140)
POTASSIUM: 4.4 meq/L (ref 3.5–5.1)
SODIUM: 142 meq/L (ref 136–145)
Total Bilirubin: 0.27 mg/dL (ref 0.20–1.20)
Total Protein: 7 g/dL (ref 6.4–8.3)

## 2016-02-23 LAB — CBC WITH DIFFERENTIAL/PLATELET
BASO%: 0.4 % (ref 0.0–2.0)
BASOS ABS: 0 10*3/uL (ref 0.0–0.1)
EOS%: 2.4 % (ref 0.0–7.0)
Eosinophils Absolute: 0.1 10*3/uL (ref 0.0–0.5)
HEMATOCRIT: 31.4 % — AB (ref 34.8–46.6)
HGB: 9.6 g/dL — ABNORMAL LOW (ref 11.6–15.9)
LYMPH#: 1.1 10*3/uL (ref 0.9–3.3)
LYMPH%: 41.3 % (ref 14.0–49.7)
MCH: 29.9 pg (ref 25.1–34.0)
MCHC: 30.6 g/dL — AB (ref 31.5–36.0)
MCV: 97.8 fL (ref 79.5–101.0)
MONO#: 0.4 10*3/uL (ref 0.1–0.9)
MONO%: 15.7 % — ABNORMAL HIGH (ref 0.0–14.0)
NEUT#: 1 10*3/uL — ABNORMAL LOW (ref 1.5–6.5)
NEUT%: 40.2 % (ref 38.4–76.8)
Platelets: 61 10*3/uL — ABNORMAL LOW (ref 145–400)
RBC: 3.21 10*6/uL — AB (ref 3.70–5.45)
RDW: 16.1 % — ABNORMAL HIGH (ref 11.2–14.5)
WBC: 2.5 10*3/uL — ABNORMAL LOW (ref 3.9–10.3)

## 2016-02-23 MED ORDER — PROCHLORPERAZINE MALEATE 10 MG PO TABS: 10.0000 mg | ORAL_TABLET | Freq: Four times a day (QID) | ORAL | 0 refills | Status: DC | PRN

## 2016-02-23 MED ORDER — SODIUM CHLORIDE 0.9 % IV SOLN
Freq: Once | INTRAVENOUS | Status: AC
  Administered 2016-02-23: 11:00:00 via INTRAVENOUS

## 2016-02-23 MED ORDER — SODIUM CHLORIDE 0.9 % IJ SOLN
10.0000 mL | INTRAMUSCULAR | Status: DC | PRN
  Administered 2016-02-23: 10 mL via INTRAVENOUS
  Filled 2016-02-23: qty 10

## 2016-02-23 MED ORDER — DEXAMETHASONE SODIUM PHOSPHATE 10 MG/ML IJ SOLN
INTRAMUSCULAR | Status: AC
  Filled 2016-02-23: qty 1

## 2016-02-23 MED ORDER — PALONOSETRON HCL INJECTION 0.25 MG/5ML
0.2500 mg | Freq: Once | INTRAVENOUS | Status: AC
  Administered 2016-02-23: 0.25 mg via INTRAVENOUS

## 2016-02-23 MED ORDER — HEPARIN SOD (PORK) LOCK FLUSH 100 UNIT/ML IV SOLN
500.0000 [IU] | Freq: Once | INTRAVENOUS | Status: AC | PRN
  Administered 2016-02-23: 500 [IU]
  Filled 2016-02-23: qty 5

## 2016-02-23 MED ORDER — CARBOPLATIN CHEMO INTRADERMAL TEST DOSE 100MCG/0.02ML
100.0000 ug | Freq: Once | INTRADERMAL | Status: AC
  Administered 2016-02-23: 100 ug via INTRADERMAL
  Filled 2016-02-23: qty 0.01

## 2016-02-23 MED ORDER — SODIUM CHLORIDE 0.9% FLUSH
10.0000 mL | INTRAVENOUS | Status: DC | PRN
  Administered 2016-02-23: 10 mL
  Filled 2016-02-23: qty 10

## 2016-02-23 MED ORDER — SODIUM CHLORIDE 0.9 % IV SOLN
570.0000 mg | Freq: Once | INTRAVENOUS | Status: AC
  Administered 2016-02-23: 570 mg via INTRAVENOUS
  Filled 2016-02-23: qty 57

## 2016-02-23 MED ORDER — PALONOSETRON HCL INJECTION 0.25 MG/5ML
INTRAVENOUS | Status: AC
  Filled 2016-02-23: qty 5

## 2016-02-23 MED ORDER — DEXAMETHASONE SODIUM PHOSPHATE 10 MG/ML IJ SOLN
4.0000 mg | Freq: Once | INTRAMUSCULAR | Status: AC
  Administered 2016-02-23: 4 mg via INTRAVENOUS

## 2016-02-23 NOTE — Progress Notes (Signed)
Hematology and Oncology Follow Up Visit  [name] [id number] 04-13-1955 61 y.o. 02/23/2016 10:27 AM    Principle Diagnosis: 61 year old woman diagnosed with:  1. 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 This was diagnosed in July of 2014. 2. 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 The tumor is ER positive PR negative, HER-2 negative.   Prior Therapy:  She is status post paracentesis performed on 09/17/2012 with the cytology confirmed the presence of adenocarcinoma and immunohistochemical stains suggest GYN etiology. She is also status post lumpectomy for breast cancer diagnosed 25 years ago followed by radiation and chemotherapy under the care of Dr. Sonny Dandy. She did not receive any hormonal therapy. Chemotherapy utilizing carboplatin and Taxotere started on 10/01/2012. Avastin was added with cycle 4. This was discontinued in June of 2015. She is S/P Avastin maintenance only between June 2015 till March 2017. Therapy discontinued due to progression of disease. She is status post Doxil salvage chemotherapy therapy discontinued because of hypersensitivity reaction in March 2017.  Current therapy:  Carboplatin and AUC of 5 every 3 weeks cycle 1 started on 07/08/2015. She is here for cycle 11 of therapy.  Interim History: Mrs. Radke presents today for a followup visit with her husband. Since her last visit, she continues improvement in her overall health. Her chest wall cellulitis appears to have resolved at this time. She is no longer reporting any erythema, drainage or pain. She denied any recent hospitalization or illnesses. She continues to injury reasonable quality of life without any decline in her appetite or performance status. She denied any abdominal distention or early satiety.  She tolerated the last cycle of chemotherapy without major issues.  She did report some mild nausea associated with chemotherapy treatment. She does use Zofran every 8 hours but at times she requires another breakthrough nausea medication.  She does not report any headaches, blurry vision, syncope or seizures. She denied any fever chills. She does report some chest discomfort but no dyspnea on exertion. Has not reported any shortness of breath or cough. She denied any pain with swallowing. She does not report any vomiting, constipation does report some occasional diarrhea. She denied any GYN bleeding. She reports no anxiety or depression. Remainder of the review of systems is unremarkable.  Medications: I have reviewed the patient's current medications.  Current Outpatient Prescriptions  Medication Sig Dispense Refill  . acetaminophen (TYLENOL) 325 MG tablet Take 2 tablets (650 mg total) by mouth every 4 (four) hours as needed for mild pain, fever or headache. 30 tablet 0  . Acetylcarnitine HCl (ACETYL L-CARNITINE PO) Take 2,000 mg by mouth at bedtime.    . ALPRAZolam (XANAX) 0.5 MG tablet Take 0.5 mg by mouth 2 (two) times daily as needed for anxiety.    Marland Kitchen aspirin 81 MG chewable tablet Chew 1 tablet (81 mg total) by mouth daily. 30 tablet 0  . atorvastatin (LIPITOR) 40 MG tablet Take 1 tablet (40 mg total) by mouth daily at 6 PM. 30 tablet 0  . carvedilol (COREG) 3.125 MG tablet Take 1/2 tablet by mouth twice daily with meals. 30 tablet 9  . clopidogrel (PLAVIX) 75 MG tablet Take 1 tablet (75 mg total) by mouth daily with breakfast. 30 tablet 0  . clotrimazole-betamethasone (LOTRISONE) cream Apply 1 application topically 2 (two) times daily. 30 g 1  . divalproex (DEPAKOTE) 250 MG DR tablet  Take by mouth. Take 529ms in the morning and 75100m at night    . fluticasone (FLONASE) 50 MCG/ACT nasal spray INHALE 2 SPRAYS INTO EACH NOSTRIL EVERY NIGHT  11  . gabapentin (NEURONTIN) 100 MG capsule Take 1 capsule (100 mg total) by mouth 2 (two) times daily. 180 capsule 0  .  lamoTRIgine (LAMICTAL) 100 MG tablet Take 100 mg by mouth 2 (two) times daily.    . Marland Kitchenevothyroxine (SYNTHROID, LEVOTHROID) 100 MCG tablet Take 100 mcg by mouth daily before breakfast.     . lidocaine-prilocaine (EMLA) cream Apply topically as needed. Apply to port with every chemotherapy. 30 g 0  . loperamide (IMODIUM A-D) 2 MG tablet Take 2 mg by mouth as needed for diarrhea or loose stools.    . Marland Kitchenoratadine (CLARITIN) 10 MG tablet Take 10 mg by mouth daily as needed for allergies (with chemo).    . nitroGLYCERIN (NITROSTAT) 0.4 MG SL tablet Place 1 tablet (0.4 mg total) under the tongue every 5 (five) minutes as needed for chest pain. 25 tablet 3  . nystatin cream (MYCOSTATIN) Apply 1 application topically 2 (two) times daily. 45 g 0  . ondansetron (ZOFRAN) 8 MG tablet TAKE 1 TABLET BY MOUTH EVERY 8 HOURS AS NEEDED FOR NAUSEA AND VOMITING 30 tablet 2  . pantoprazole (PROTONIX) 40 MG tablet Take 1 tablet (40 mg total) by mouth daily. 30 tablet 11  . rOPINIRole (REQUIP) 0.5 MG tablet Take 1.5 mg by mouth at bedtime.      No current facility-administered medications for this visit.    Facility-Administered Medications Ordered in Other Visits  Medication Dose Route Frequency Provider Last Rate Last Dose  . heparin lock flush 100 unit/mL  500 Units Intravenous Once FiWyatt PortelaMD      . sodium chloride flush (NS) 0.9 % injection 10 mL  10 mL Intravenous PRN FiWyatt PortelaMD         Allergies:  Allergies  Allergen Reactions  . Doxil [Doxorubicin Hcl Liposomal] Anaphylaxis    1st Doxil.   . Pollen Extract Other (See Comments)    Pollen and grass causes a lot sneezing    Past Medical History, Surgical history, Social history, and Family History were reviewed and updated.   Physical Exam: Blood pressure 124/64, pulse 72, temperature 98 F (36.7 C), temperature source Oral, resp. rate 18, height 5' 1" (1.549 m), weight 160 lb 4.8 oz (72.7 kg), SpO2 100 %.  ECOG: 1 General appearance:  Alert, awake woman without distress.. Head: Normocephalic, without obvious abnormality. No oral thrush noted. Neck: no adenopathy Lymph nodes: Cervical, supraclavicular, and axillary nodes normal. Heart:regular rate and rhythm, S1, S2 normal, no murmur, click, rub or gallop Lung:chest clear, no wheezing, rales, normal symmetric air entry Chest wall examination: Showed a well healed ulcer inferior to the Port-A-Cath. No chest wall pain or tenderness. Abdomin: Soft, nontender with good bowel sounds. No rebound or guarding. No splenomegaly. EXT:no erythema, induration, or nodules. No edema. Skin: No rashes or lesions. No ecchymosis.  Lab Results: Lab Results  Component Value Date   WBC 2.5 (L) 02/23/2016   HGB 9.6 (L) 02/23/2016   HCT 31.4 (L) 02/23/2016   MCV 97.8 02/23/2016   PLT 61 (L) 02/23/2016     Chemistry      Component Value Date/Time   NA 140 02/02/2016 0958   K 4.7 02/02/2016 0958   CL 104 01/09/2016 0441   CO2 26 02/02/2016 0958   BUN 15.6 02/02/2016  5681   CREATININE 0.7 02/02/2016 0958      Component Value Date/Time   CALCIUM 9.2 02/02/2016 0958   ALKPHOS 80 02/02/2016 0958   AST 16 02/02/2016 0958   ALT 12 02/02/2016 0958   BILITOT 0.30 02/02/2016 0958     Results for CYTHIA, BACHTEL (MRN [id number]) as of 02/23/2016 10:09  Ref. Range 12/01/2015 11:52 12/22/2015 08:22 02/02/2016 09:58  Cancer Antigen (CA) 125 Latest Ref Range: 0.0 - 38.1 U/mL 41.4 (H) 39.1 (H) 32.4     EXAM: CT ABDOMEN AND PELVIS WITH CONTRAST  TECHNIQUE: Multidetector CT imaging of the abdomen and pelvis was performed using the standard protocol following bolus administration of intravenous contrast.  CONTRAST:  173m ISOVUE-300 IOPAMIDOL (ISOVUE-300) INJECTION 61%  COMPARISON:  09/29/2015  FINDINGS: Lower chest:  Unremarkable.  Hepatobiliary: Persistent nodular hepatic contour without focal intraparenchymal mass lesion. There is no evidence for gallstones, gallbladder wall  thickening, or pericholecystic fluid. No intrahepatic or extrahepatic biliary dilation.  Pancreas: No focal mass lesion. No dilatation of the main duct. No intraparenchymal cyst. No peripancreatic edema.  Spleen: No splenomegaly. No focal mass lesion.  Adrenals/Urinary Tract: Stable 4 mm low-density lesion lower pole right kidney, too small to characterize. Left kidney unremarkable. No evidence for hydroureter. The urinary bladder appears normal for the degree of distention.  Stomach/Bowel: Stomach is nondistended. No gastric wall thickening. No evidence of outlet obstruction. Duodenum is normally positioned as is the ligament of Treitz. No small bowel wall thickening. No small bowel dilatation. The terminal ileum is normal. The appendix is not visualized, but there is no edema or inflammation in the region of the cecum.No gross colonic mass. No colonic wall thickening. No substantial diverticular change.  Vascular/Lymphatic: There is abdominal aortic atherosclerosis without aneurysm. There is no gastrohepatic or hepatoduodenal ligament lymphadenopathy. No intraperitoneal or retroperitoneal lymphadenopathy. Recannulized paraumbilical vein again noted.  Reproductive: The uterus has normal CT imaging appearance. There is no adnexal mass.  Other: Omental and peritoneal disease persists without a clear trend towards worsening or decreasing bulk. Omental position is slightly different between the 2 studies making reproducible measurement difficult, but comparing the infra hepatic right omental disease with cross section on the coronal reconstructions, the thickness on today's study (image 42 series 5) is about 1.6 cm which compares to 1.9 cm on the prior study. Measuring the left omental infra splenic disease is difficult as it has a more elongated configuration on today's study compared a more globular configuration on the prior exam. Comparing the anterior midline omental  disease on sagittal reconstructions, the maximum thickness obtained on today's study (image 30 series 6) is 1.7 cm which compares to 2.2 cm when I measure at the same level and in the same location on the prior exam. Direct comparative assessment of the axial images provided in the previous report to axial imaging today shows the midline omental cake measured previously at 11.9 x 2.5 cm to measure 11.8 x 1.9 cm today.  Trace ascites was seen on the prior study and no discernible ascites is evident on today's exam.  Musculoskeletal: Bone windows reveal no worrisome lytic or sclerotic osseous lesions.  IMPRESSION: 1. No substantial interval change in exam. The omental disease measured on the prior study is stable to perhaps minimally decreased in the interval although differential omental positioning between the 2 studies hinders assessment. There are no findings suggestive of overt progression on today's exam. 2. Persistent nodularity of the liver contour suggesting cirrhosis and recanalization of the paraumbilical  vein would be compatible with portal venous hypertension. 3.  Abdominal Aortic Atherosclerois (ICD10-170.0)      Impression and Plan:  61 year old woman with the following issues:  1. Peritoneal carcinomatosis with omental involvement. The pathology confirmed the presence of adenocarcinoma of likely GYN etiology. Her CA 125 was elevated at 333.5 on 09/19/2012. She is status post systemic chemotherapy with an excellent response utilizing carboplatin and Taxotere and a Avastin . This therapy was held in June of 2015 and currently on Avastin maintenance.   CT scan obtained on 05/05/2015 as well as her tumor markers showed clear progression. Her CA-125 is up to 189 and her CT scan showed increase in her peritoneal disease.   She is currently receiving salvage therapy with carboplatin single agent at AUC of 5 with her tumor marker continue to respond at this time.    CT scan obtained on 02/17/2016 was reviewed today and showed a positive response to therapy. Her omental metastasis is decreasing slightly compared to previous examination.  The plan is to continue with the same dose and schedule given her excellent tolerance and positive response to therapy.    2. IV access: Port-A-Cath is used for chemotherapy without complications.  3. Thrombocytopenia: Her platelets remained relatively stable. Thrombocytopenia is related to treatment as well as possible early cirrhosis of the liver.  4. Abdominal distention: No ascites noted on ultrasound unlikely related to peritoneal carcinomatosis. Clinically improving at this time.  5. Left breast mass: She status post biopsy with the pathology indicating T2 N1 disease with the tumor have ER positive, PR negative HER-2 negative. Her Ki-67 is 50%.  The plan is to repeat mammogram in the near future for a surveillance purposes and assess her tumor size. If she has an enlarging tumor, partial mastectomy would be a consideration given her excellent response to treatment of her ovarian cancer. At her tumor continues to be very small it is reasonable to continue with treating her ovarian cancer and monitor her early breast cancer.  8. Neutropenia: Neulasta will be given after a cycles of therapy.  9. Chest wall cellulitis: Appears to have improved dramatically at this time. She is no longer on antibiotics.  10. Nausea: Prescription for Compazine will be made available to patient in addition to Zofran.  11. Hypertension: Blood pressure is adequately controlled at this time.  12. The followup: She will in 3 weeks for the next cycle of therapy.    Zola Button, MD 1/4/201810:27 AM

## 2016-02-23 NOTE — Telephone Encounter (Signed)
Per 1/4 LOS I have scheduled appt and gave calendar

## 2016-02-23 NOTE — Progress Notes (Signed)
Per Dr Alen Blew, Faythe Ghee to treat today with Neut # 1.0 and platelets 61

## 2016-02-23 NOTE — Progress Notes (Signed)
Per dr shadad, okay to treat today despite counts. 

## 2016-02-23 NOTE — Patient Instructions (Signed)

## 2016-02-23 NOTE — Telephone Encounter (Signed)
Appointments scheduled per 1/4 LOS. Patient given AVS report and calendars with future scheduled appointments. °

## 2016-02-24 ENCOUNTER — Encounter: Payer: Self-pay | Admitting: Family Medicine

## 2016-02-24 ENCOUNTER — Ambulatory Visit (INDEPENDENT_AMBULATORY_CARE_PROVIDER_SITE_OTHER): Payer: PPO | Admitting: Family Medicine

## 2016-02-24 ENCOUNTER — Ambulatory Visit (HOSPITAL_BASED_OUTPATIENT_CLINIC_OR_DEPARTMENT_OTHER): Payer: PPO

## 2016-02-24 ENCOUNTER — Encounter (HOSPITAL_COMMUNITY): Payer: PPO

## 2016-02-24 ENCOUNTER — Ambulatory Visit (HOSPITAL_COMMUNITY): Payer: PPO

## 2016-02-24 VITALS — BP 134/61 | HR 85 | Temp 97.9°F | Resp 18

## 2016-02-24 VITALS — BP 126/80 | HR 77 | Resp 12 | Ht 61.0 in | Wt 162.5 lb

## 2016-02-24 DIAGNOSIS — F419 Anxiety disorder, unspecified: Secondary | ICD-10-CM | POA: Diagnosis not present

## 2016-02-24 DIAGNOSIS — R112 Nausea with vomiting, unspecified: Secondary | ICD-10-CM | POA: Diagnosis not present

## 2016-02-24 DIAGNOSIS — Z5189 Encounter for other specified aftercare: Secondary | ICD-10-CM | POA: Diagnosis not present

## 2016-02-24 DIAGNOSIS — C482 Malignant neoplasm of peritoneum, unspecified: Secondary | ICD-10-CM | POA: Diagnosis not present

## 2016-02-24 DIAGNOSIS — R21 Rash and other nonspecific skin eruption: Secondary | ICD-10-CM | POA: Diagnosis not present

## 2016-02-24 DIAGNOSIS — I2510 Atherosclerotic heart disease of native coronary artery without angina pectoris: Secondary | ICD-10-CM

## 2016-02-24 LAB — CA 125: CANCER ANTIGEN (CA) 125: 31.7 U/mL (ref 0.0–38.1)

## 2016-02-24 MED ORDER — PEGFILGRASTIM INJECTION 6 MG/0.6ML ~~LOC~~
6.0000 mg | PREFILLED_SYRINGE | Freq: Once | SUBCUTANEOUS | Status: AC
  Administered 2016-02-24: 6 mg via SUBCUTANEOUS
  Filled 2016-02-24: qty 0.6

## 2016-02-24 NOTE — Progress Notes (Signed)
HPI:   [name] is a 61 y.o. female, who is here today to follow on recent OV, 02/09/16, when she had several concerns.  Hx of CAD, last office visit she was c/o chest pain and dizziness. + Orthostatic, so Imdur 30 mg 1/2 tab daily was discontinued. She is still on Coreg 3.125 mg 1/2 tab bid.  Dizziness "a whole whole better" one episode since her last OV, milder, lasted a few seconds. 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  Chest pain has resolved. She denies dyspnea or diaphoresis.  Nausea and vomiting: Still nauseated but improved. According to pt,  after chemo yesterday she saw oncologists and received another Rx to take for nausea as needed. She has not had vomiting since her last OV. Hx of GERD, she has not noted heartburn. She is on Protonix 40 mg daily.   Denies abdominal pain, changes in bowel habits, blood in stool or melena.   Chest wall lesion, s/p cellulitis and right breast Bx: Reporting greatly improvement , still pruritic. She saw surgeon a couple days after her last OV and according to pt, "every thing was good." Next f/u appt on 03/23/16.    BP at home checked a few times and has been 112-120's/?    Since her last visit she also saw her psychiatrists, 02/22/16,reporting that her Xanax 0.5 mg was increased: Now taking 1/2 am noon and continue 1 tab night.Rest of medications unchanged.   Recurrent pruritic skin rash, Lotrisone has helped, reporting that rash has been intermittently. No tenderness.   No new concerns today.   Review of Systems  Constitutional: Negative for chills, diaphoresis and fever.  HENT: Negative for mouth sores, nosebleeds, sore throat and trouble swallowing.   Respiratory: Negative for cough, shortness of breath and wheezing.   Cardiovascular: Negative for chest pain and palpitations.  Gastrointestinal: Positive for nausea. Negative for abdominal pain and vomiting.       No changes in bowel habits.    Genitourinary: Negative for decreased urine volume and hematuria.  Skin: Negative for wound.  Neurological: Negative for dizziness, syncope, weakness and headaches.  Psychiatric/Behavioral: Negative for confusion. The patient is nervous/anxious.       Current Outpatient Prescriptions on File Prior to Visit  Medication Sig Dispense Refill  . acetaminophen (TYLENOL) 325 MG tablet Take 2 tablets (650 mg total) by mouth every 4 (four) hours as needed for mild pain, fever or headache. 30 tablet 0  . Acetylcarnitine HCl (ACETYL L-CARNITINE PO) Take 2,000 mg by mouth at bedtime.    . ALPRAZolam (XANAX) 0.5 MG tablet Take 0.5 mg by mouth 2 (two) times daily as needed for anxiety.    Marland Kitchen aspirin 81 MG chewable tablet Chew 1 tablet (81 mg total) by mouth daily. 30 tablet 0  . atorvastatin (LIPITOR) 40 MG tablet Take 1 tablet (40 mg total) by mouth daily at 6 PM. 30 tablet 0  . carvedilol (COREG) 3.125 MG tablet Take 1/2 tablet by mouth twice daily with meals. 30 tablet 9  . clopidogrel (PLAVIX) 75 MG tablet Take 1 tablet (75 mg total) by mouth daily with breakfast. 30 tablet 0  . clotrimazole-betamethasone (LOTRISONE) cream Apply 1 application topically 2 (two) times daily. 30 g 1  . divalproex (DEPAKOTE) 250 MG DR tablet Take by mouth. Take 500mg s in the morning and 750mg s at night    . fluticasone (FLONASE) 50 MCG/ACT nasal spray INHALE 2 SPRAYS INTO EACH NOSTRIL EVERY NIGHT  11  . gabapentin (NEURONTIN) 100 MG capsule Take 1 capsule (100 mg total) by mouth 2 (two) times daily. 180 capsule 0  . lamoTRIgine (LAMICTAL) 100 MG tablet Take 100 mg by mouth 2 (two) times daily.    Marland Kitchen levothyroxine (SYNTHROID, LEVOTHROID) 100 MCG tablet Take 100 mcg by mouth daily before breakfast.     . lidocaine-prilocaine (EMLA) cream Apply topically as needed. Apply to port with every chemotherapy. 30 g 0  . loperamide (IMODIUM A-D) 2 MG tablet Take 2 mg by mouth as needed for diarrhea or loose stools.    Marland Kitchen loratadine  (CLARITIN) 10 MG tablet Take 10 mg by mouth daily as needed for allergies (with chemo).    . nitroGLYCERIN (NITROSTAT) 0.4 MG SL tablet Place 1 tablet (0.4 mg total) under the tongue every 5 (five) minutes as needed for chest pain. 25 tablet 3  . nystatin cream (MYCOSTATIN) Apply 1 application topically 2 (two) times daily. 45 g 0  . ondansetron (ZOFRAN) 8 MG tablet TAKE 1 TABLET BY MOUTH EVERY 8 HOURS AS NEEDED FOR NAUSEA AND VOMITING 30 tablet 2  . pantoprazole (PROTONIX) 40 MG tablet Take 1 tablet (40 mg total) by mouth daily. 30 tablet 11  . prochlorperazine (COMPAZINE) 10 MG tablet Take 1 tablet (10 mg total) by mouth every 6 (six) hours as needed for nausea or vomiting. 30 tablet 0  . rOPINIRole (REQUIP) 0.5 MG tablet Take 1.5 mg by mouth at bedtime.      Current Facility-Administered Medications on File Prior to Visit  Medication Dose Route Frequency Provider Last Rate Last Dose  . heparin lock flush 100 unit/mL  500 Units Intravenous Once Wyatt Portela, MD      . sodium chloride flush (NS) 0.9 % injection 10 mL  10 mL Intravenous PRN Wyatt Portela, MD         Past Medical History:  Diagnosis Date  . ADD (attention deficit disorder)   . Allergy    seasonal  . Anxiety   . Bipolar disorder ([place])   . Breast cancer ([place]) 1989   right breast  . Cancer ([place])    Omentum  . Coronary artery disease   . Depression   . Hematochezia   . History of echocardiogram    a. Limited Echo 9/17: EF 60-65%, no RWMA, Gr 1 DD, trivial AI, trivial MR, GLS -12% (likely underestimated), no pericardial effusion  . Hyperlipidemia   . Hypothyroidism   . Maintenance chemotherapy    Pt has chemo every 3 weeks (on Friday)  . NSTEMI (non-ST elevated myocardial infarction) ([place]) 10/26/2015  . Osteopenia 03/2009   t score -2.1 FRAX 4.6/0.4  . Premature menopause   . Sleep apnea    mild   Allergies  Allergen Reactions  . Doxil [Doxorubicin Hcl Liposomal] Anaphylaxis    1st Doxil.   . Pollen Extract  Other (See Comments)    Pollen and grass causes a lot sneezing    Social History   Social History  . Marital status: Married    Spouse name: [name]  . Number of children: 0  . Years of education: 14   Occupational History  . unemployed [employer]   Social History Main Topics  . Smoking status: Never Smoker  . Smokeless tobacco: Never Used  . Alcohol use 0.0 oz/week     Comment: very rarely - maybe 1 glass wine every 3 mos  . Drug use: No  . Sexual activity: Not  Currently    Birth control/ protection: Post-menopausal     Comment: 1st intercourse 36 yo-5 partners   Other Topics Concern  . None   Social History Narrative   Lives at home with husband.    Caffeine use:  Tea/soda occass    Vitals:   02/24/16 0854  BP: 126/80  Pulse: 77  Resp: 12   Body mass index is 30.7 kg/m.    Physical Exam  Nursing note and vitals reviewed. Constitutional: She is oriented to person, place, and time. She appears well-developed. No distress.  HENT:  Head: Atraumatic.  Mouth/Throat: Oropharynx is clear and moist and mucous membranes are normal.  Eyes: Conjunctivae and EOM are normal.  Cardiovascular: Normal rate and regular rhythm.   No murmur heard. Respiratory: Effort normal and breath sounds normal. No respiratory distress.  GI: Soft. She exhibits no mass. There is no hepatomegaly. There is no tenderness.  Musculoskeletal: She exhibits edema (1+ pitting LE edema LLE and trace RLE). She exhibits no tenderness.  Neurological: She is alert and oriented to person, place, and time. She has normal strength. Coordination normal.  Skin: Skin is warm. No abrasion and no ecchymosis noted. No erythema.  Scarring changes on left para sternal area, no wound or erythema appreciated.+ Post inflammatory changes and poor skin quality.    Psychiatric: She has a normal mood and affect.  Well groomed, good eye contact.      ASSESSMENT AND PLAN:     Anabelen was seen today  for follow-up.  Diagnoses and all orders for this visit:    Nausea and vomiting in adult  Vomiting resolved. Nausea otherwise stable. We again review possible etiologies, she will continue current management. Follow-up as needed.  Coronary artery disease involving native coronary artery of native heart without angina pectoris   Asymptomatic. She will continue Coreg 3.125 mg 1/2 tab bid. Continue following with cardiologists, next appt 05/2016.  Anxiety  Treatment recently adjusted. Continue following with psychiatrist.  Rash and nonspecific skin eruption  Recurrent, I still feels like it is fungal in etiology (tinea corporis and intertrigo). Recommended continuing Lotrisone and Nystatin cream intermittently and as needed.  OTC Selsun Blue might also help, recommended using it daily during shower, rinse after 5 minutes. Follow-up as needed.      -Ms. ALEANE PERINI was advised to return sooner than planned today if new concerns arise.       Kaiyon Hynes G. [name], MD  [facility]. [place] office.

## 2016-02-24 NOTE — Progress Notes (Signed)
Pre visit review using our clinic review tool, if applicable. No additional management support is needed unless otherwise documented below in the visit note. 

## 2016-02-24 NOTE — Patient Instructions (Addendum)
A few things to remember from today's visit:   Nausea and vomiting in adult  Coronary artery disease involving native coronary artery of native heart without angina pectoris  No changes today. Keep appointment with surgeon and continue following with cardiologists and oncologists. Selsun Blue may help with recurrent fungal rash, continue current medication (topical)  Please be sure medication list is accurate. If a new problem present, please set up appointment sooner than planned today.

## 2016-02-24 NOTE — Patient Instructions (Signed)
Pegfilgrastim injection What is this medicine? PEGFILGRASTIM (PEG fil gra stim) is a long-acting granulocyte colony-stimulating factor that stimulates the growth of neutrophils, a type of white blood cell important in the body's fight against infection. It is used to reduce the incidence of fever and infection in patients with certain types of cancer who are receiving chemotherapy that affects the bone marrow, and to increase survival after being exposed to high doses of radiation. This medicine may be used for other purposes; ask your health care provider or pharmacist if you have questions. What should I tell my health care provider before I take this medicine? They need to know if you have any of these conditions: -kidney disease -latex allergy -ongoing radiation therapy -sickle cell disease -skin reactions to acrylic adhesives (On-Body Injector only) -an unusual or allergic reaction to pegfilgrastim, filgrastim, other medicines, foods, dyes, or preservatives -pregnant or trying to get pregnant -breast-feeding How should I use this medicine? This medicine is for injection under the skin. If you get this medicine at home, you will be taught how to prepare and give the pre-filled syringe or how to use the On-body Injector. Refer to the patient Instructions for Use for detailed instructions. Use exactly as directed. Take your medicine at regular intervals. Do not take your medicine more often than directed. It is important that you put your used needles and syringes in a special sharps container. Do not put them in a trash can. If you do not have a sharps container, call your pharmacist or healthcare provider to get one. Talk to your pediatrician regarding the use of this medicine in children. While this drug may be prescribed for selected conditions, precautions do apply. Overdosage: If you think you have taken too much of this medicine contact a poison control center or emergency room at  once. NOTE: This medicine is only for you. Do not share this medicine with others. What if I miss a dose? It is important not to miss your dose. Call your doctor or health care professional if you miss your dose. If you miss a dose due to an On-body Injector failure or leakage, a new dose should be administered as soon as possible using a single prefilled syringe for manual use. What may interact with this medicine? Interactions have not been studied. Give your health care provider a list of all the medicines, herbs, non-prescription drugs, or dietary supplements you use. Also tell them if you smoke, drink alcohol, or use illegal drugs. Some items may interact with your medicine. This list may not describe all possible interactions. Give your health care provider a list of all the medicines, herbs, non-prescription drugs, or dietary supplements you use. Also tell them if you smoke, drink alcohol, or use illegal drugs. Some items may interact with your medicine. What should I watch for while using this medicine? You may need blood work done while you are taking this medicine. If you are going to need a MRI, CT scan, or other procedure, tell your doctor that you are using this medicine (On-Body Injector only). What side effects may I notice from receiving this medicine? Side effects that you should report to your doctor or health care professional as soon as possible: -allergic reactions like skin rash, itching or hives, swelling of the face, lips, or tongue -dizziness -fever -pain, redness, or irritation at site where injected -pinpoint red spots on the skin -red or dark-brown urine -shortness of breath or breathing problems -stomach or side pain, or pain   at the shoulder -swelling -tiredness -trouble passing urine or change in the amount of urine Side effects that usually do not require medical attention (report to your doctor or health care professional if they continue or are  bothersome): -bone pain -muscle pain This list may not describe all possible side effects. Call your doctor for medical advice about side effects. You may report side effects to FDA at 1-800-FDA-1088. Where should I keep my medicine? Keep out of the reach of children. Store pre-filled syringes in a refrigerator between 2 and 8 degrees C (36 and 46 degrees F). Do not freeze. Keep in carton to protect from light. Throw away this medicine if it is left out of the refrigerator for more than 48 hours. Throw away any unused medicine after the expiration date. NOTE: This sheet is a summary. It may not cover all possible information. If you have questions about this medicine, talk to your doctor, pharmacist, or health care provider.    2016, Elsevier/Gold Standard. (2014-02-25 14:30:14)  

## 2016-02-27 ENCOUNTER — Ambulatory Visit (HOSPITAL_COMMUNITY): Payer: PPO

## 2016-02-27 ENCOUNTER — Encounter (HOSPITAL_COMMUNITY): Payer: PPO

## 2016-02-29 ENCOUNTER — Encounter (HOSPITAL_COMMUNITY): Payer: PPO

## 2016-02-29 ENCOUNTER — Ambulatory Visit (HOSPITAL_COMMUNITY): Payer: PPO

## 2016-03-02 ENCOUNTER — Encounter (HOSPITAL_COMMUNITY): Payer: PPO

## 2016-03-02 ENCOUNTER — Ambulatory Visit (HOSPITAL_COMMUNITY): Payer: PPO

## 2016-03-05 ENCOUNTER — Encounter (HOSPITAL_COMMUNITY): Payer: PPO

## 2016-03-05 ENCOUNTER — Ambulatory Visit (HOSPITAL_COMMUNITY): Payer: PPO

## 2016-03-07 ENCOUNTER — Encounter (HOSPITAL_COMMUNITY): Payer: PPO

## 2016-03-07 ENCOUNTER — Ambulatory Visit (HOSPITAL_COMMUNITY): Payer: PPO

## 2016-03-09 ENCOUNTER — Ambulatory Visit (HOSPITAL_COMMUNITY): Payer: PPO

## 2016-03-09 ENCOUNTER — Encounter (HOSPITAL_COMMUNITY): Payer: PPO

## 2016-03-12 ENCOUNTER — Encounter (HOSPITAL_COMMUNITY): Payer: PPO

## 2016-03-12 ENCOUNTER — Ambulatory Visit (HOSPITAL_COMMUNITY): Payer: PPO

## 2016-03-14 ENCOUNTER — Ambulatory Visit (HOSPITAL_COMMUNITY): Payer: PPO

## 2016-03-14 ENCOUNTER — Other Ambulatory Visit: Payer: Self-pay | Admitting: Oncology

## 2016-03-14 ENCOUNTER — Encounter (HOSPITAL_COMMUNITY): Payer: PPO

## 2016-03-14 DIAGNOSIS — N6320 Unspecified lump in the left breast, unspecified quadrant: Secondary | ICD-10-CM

## 2016-03-15 ENCOUNTER — Telehealth: Payer: Self-pay | Admitting: Oncology

## 2016-03-15 ENCOUNTER — Ambulatory Visit (HOSPITAL_BASED_OUTPATIENT_CLINIC_OR_DEPARTMENT_OTHER): Payer: PPO | Admitting: Oncology

## 2016-03-15 ENCOUNTER — Ambulatory Visit: Payer: PPO

## 2016-03-15 ENCOUNTER — Ambulatory Visit (HOSPITAL_BASED_OUTPATIENT_CLINIC_OR_DEPARTMENT_OTHER): Payer: PPO

## 2016-03-15 ENCOUNTER — Telehealth: Payer: Self-pay | Admitting: *Deleted

## 2016-03-15 ENCOUNTER — Other Ambulatory Visit (HOSPITAL_BASED_OUTPATIENT_CLINIC_OR_DEPARTMENT_OTHER): Payer: PPO

## 2016-03-15 VITALS — BP 137/49 | HR 69 | Temp 98.2°F | Resp 18 | Ht 61.0 in | Wt 161.0 lb

## 2016-03-15 DIAGNOSIS — D696 Thrombocytopenia, unspecified: Secondary | ICD-10-CM | POA: Diagnosis not present

## 2016-03-15 DIAGNOSIS — C482 Malignant neoplasm of peritoneum, unspecified: Secondary | ICD-10-CM

## 2016-03-15 DIAGNOSIS — C50412 Malignant neoplasm of upper-outer quadrant of left female breast: Secondary | ICD-10-CM

## 2016-03-15 DIAGNOSIS — Z170 Estrogen receptor positive status [ER+]: Secondary | ICD-10-CM

## 2016-03-15 DIAGNOSIS — Z95828 Presence of other vascular implants and grafts: Secondary | ICD-10-CM

## 2016-03-15 DIAGNOSIS — Z5111 Encounter for antineoplastic chemotherapy: Secondary | ICD-10-CM | POA: Diagnosis not present

## 2016-03-15 DIAGNOSIS — C50919 Malignant neoplasm of unspecified site of unspecified female breast: Secondary | ICD-10-CM

## 2016-03-15 LAB — CBC WITH DIFFERENTIAL/PLATELET
BASO%: 0 % (ref 0.0–2.0)
BASOS ABS: 0 10*3/uL (ref 0.0–0.1)
EOS%: 1 % (ref 0.0–7.0)
Eosinophils Absolute: 0 10*3/uL (ref 0.0–0.5)
HEMATOCRIT: 31.3 % — AB (ref 34.8–46.6)
HGB: 9.7 g/dL — ABNORMAL LOW (ref 11.6–15.9)
LYMPH#: 1.3 10*3/uL (ref 0.9–3.3)
LYMPH%: 41.7 % (ref 14.0–49.7)
MCH: 30 pg (ref 25.1–34.0)
MCHC: 31 g/dL — AB (ref 31.5–36.0)
MCV: 96.9 fL (ref 79.5–101.0)
MONO#: 0.4 10*3/uL (ref 0.1–0.9)
MONO%: 11.9 % (ref 0.0–14.0)
NEUT#: 1.4 10*3/uL — ABNORMAL LOW (ref 1.5–6.5)
NEUT%: 45.4 % (ref 38.4–76.8)
PLATELETS: 63 10*3/uL — AB (ref 145–400)
RBC: 3.23 10*6/uL — AB (ref 3.70–5.45)
RDW: 17.6 % — ABNORMAL HIGH (ref 11.2–14.5)
WBC: 3 10*3/uL — ABNORMAL LOW (ref 3.9–10.3)

## 2016-03-15 MED ORDER — HEPARIN SOD (PORK) LOCK FLUSH 100 UNIT/ML IV SOLN
500.0000 [IU] | Freq: Once | INTRAVENOUS | Status: AC | PRN
  Administered 2016-03-15: 500 [IU]
  Filled 2016-03-15: qty 5

## 2016-03-15 MED ORDER — CARBOPLATIN CHEMO INTRADERMAL TEST DOSE 100MCG/0.02ML
100.0000 ug | Freq: Once | INTRADERMAL | Status: AC
Start: 2016-03-15 — End: 2016-03-15
  Administered 2016-03-15: 100 ug via INTRADERMAL
  Filled 2016-03-15: qty 0.01

## 2016-03-15 MED ORDER — SODIUM CHLORIDE 0.9% FLUSH
10.0000 mL | INTRAVENOUS | Status: DC | PRN
  Administered 2016-03-15: 10 mL
  Filled 2016-03-15: qty 10

## 2016-03-15 MED ORDER — SODIUM CHLORIDE 0.9 % IJ SOLN
10.0000 mL | INTRAMUSCULAR | Status: DC | PRN
  Administered 2016-03-15: 10 mL via INTRAVENOUS
  Filled 2016-03-15: qty 10

## 2016-03-15 MED ORDER — PALONOSETRON HCL INJECTION 0.25 MG/5ML
INTRAVENOUS | Status: AC
  Filled 2016-03-15: qty 5

## 2016-03-15 MED ORDER — DEXAMETHASONE SODIUM PHOSPHATE 10 MG/ML IJ SOLN
INTRAMUSCULAR | Status: AC
  Filled 2016-03-15: qty 1

## 2016-03-15 MED ORDER — SODIUM CHLORIDE 0.9 % IV SOLN
Freq: Once | INTRAVENOUS | Status: AC
  Administered 2016-03-15: 12:00:00 via INTRAVENOUS

## 2016-03-15 MED ORDER — DEXAMETHASONE SODIUM PHOSPHATE 10 MG/ML IJ SOLN
4.0000 mg | Freq: Once | INTRAMUSCULAR | Status: AC
  Administered 2016-03-15: 4 mg via INTRAVENOUS

## 2016-03-15 MED ORDER — SODIUM CHLORIDE 0.9 % IV SOLN
570.0000 mg | Freq: Once | INTRAVENOUS | Status: AC
  Administered 2016-03-15: 570 mg via INTRAVENOUS
  Filled 2016-03-15: qty 57

## 2016-03-15 MED ORDER — PALONOSETRON HCL INJECTION 0.25 MG/5ML
0.2500 mg | Freq: Once | INTRAVENOUS | Status: AC
  Administered 2016-03-15: 0.25 mg via INTRAVENOUS

## 2016-03-15 NOTE — Progress Notes (Signed)
Hematology and Oncology Follow Up Visit  [name] [id number] January 18, 1956 61 y.o. 03/15/2016 10:13 AM    Principle Diagnosis: 61 year old woman diagnosed with:  1. 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 This was diagnosed in July of 2014. 2. 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 The tumor is ER positive PR negative, HER-2 negative.   Prior Therapy:  She is status post paracentesis performed on 09/17/2012 with the cytology confirmed the presence of adenocarcinoma and immunohistochemical stains suggest GYN etiology. She is also status post lumpectomy for breast cancer diagnosed 25 years ago followed by radiation and chemotherapy under the care of Dr. Sonny Dandy. She did not receive any hormonal therapy. Chemotherapy utilizing carboplatin and Taxotere started on 10/01/2012. Avastin was added with cycle 4. This was discontinued in June of 2015. She is S/P Avastin maintenance only between June 2015 till March 2017. Therapy discontinued due to progression of disease. She is status post Doxil salvage chemotherapy therapy discontinued because of hypersensitivity reaction in March 2017.  Current therapy:  Carboplatin and AUC of 5 every 3 weeks cycle 1 started on 07/08/2015. She is here for cycle 12 of therapy.  Interim History: [name] presents today for a followup visit with her husband. Since her last visit, she reports feeling very well without any complaints. She is no longer reporting any erythema, drainage or pain related to her chest wall cellulitis. She tolerated the last cycle of chemotherapy without major issues. Nausea was better controlled this time without any vomiting. She denied any infusion related complications. She denied any abdominal pain or satiety.  She continues to injury reasonable quality of life without any decline in her appetite  or performance status. She continues to be active in church activities including singing the choir.  She does not report any headaches, blurry vision, syncope or seizures. She denied any fever chills. She does report some chest discomfort but no dyspnea on exertion. Has not reported any shortness of breath or cough. She denied any pain with swallowing. She does not report any vomiting, constipation does report some occasional diarrhea. She denied any GYN bleeding. She reports no anxiety or depression. Remainder of the review of systems is unremarkable.  Medications: I have reviewed the patient's current medications.  Current Outpatient Prescriptions  Medication Sig Dispense Refill  . acetaminophen (TYLENOL) 325 MG tablet Take 2 tablets (650 mg total) by mouth every 4 (four) hours as needed for mild pain, fever or headache. 30 tablet 0  . Acetylcarnitine HCl (ACETYL L-CARNITINE PO) Take 2,000 mg by mouth at bedtime.    . ALPRAZolam (XANAX) 0.5 MG tablet Take 0.5 mg by mouth 2 (two) times daily as needed for anxiety.    Marland Kitchen aspirin 81 MG chewable tablet Chew 1 tablet (81 mg total) by mouth daily. 30 tablet 0  . atorvastatin (LIPITOR) 40 MG tablet Take 1 tablet (40 mg total) by mouth daily at 6 PM. 30 tablet 0  . carvedilol (COREG) 3.125 MG tablet Take 1/2 tablet by mouth twice daily with meals. 30 tablet 9  . clopidogrel (PLAVIX) 75 MG tablet Take 1 tablet (75 mg total) by mouth daily with breakfast. 30 tablet 0  . clotrimazole-betamethasone (LOTRISONE) cream Apply 1 application topically 2 (two) times daily. 30 g 1  . divalproex (DEPAKOTE) 250 MG DR tablet Take by mouth. Take '500mg'$ s in the morning and '750mg'$ s at  night    . fluticasone (FLONASE) 50 MCG/ACT nasal spray INHALE 2 SPRAYS INTO EACH NOSTRIL EVERY NIGHT  11  . gabapentin (NEURONTIN) 100 MG capsule Take 1 capsule (100 mg total) by mouth 2 (two) times daily. 180 capsule 0  . lamoTRIgine (LAMICTAL) 100 MG tablet Take 100 mg by mouth 2 (two) times  daily.    Marland Kitchen levothyroxine (SYNTHROID, LEVOTHROID) 100 MCG tablet Take 100 mcg by mouth daily before breakfast.     . lidocaine-prilocaine (EMLA) cream Apply topically as needed. Apply to port with every chemotherapy. 30 g 0  . loperamide (IMODIUM A-D) 2 MG tablet Take 2 mg by mouth as needed for diarrhea or loose stools.    Marland Kitchen loratadine (CLARITIN) 10 MG tablet Take 10 mg by mouth daily as needed for allergies (with chemo).    . ondansetron (ZOFRAN) 8 MG tablet TAKE 1 TABLET BY MOUTH EVERY 8 HOURS AS NEEDED FOR NAUSEA AND VOMITING 30 tablet 2  . pantoprazole (PROTONIX) 40 MG tablet Take 1 tablet (40 mg total) by mouth daily. 30 tablet 11  . prochlorperazine (COMPAZINE) 10 MG tablet Take 1 tablet (10 mg total) by mouth every 6 (six) hours as needed for nausea or vomiting. 30 tablet 0  . rOPINIRole (REQUIP) 0.5 MG tablet Take 1.5 mg by mouth at bedtime.     . nitroGLYCERIN (NITROSTAT) 0.4 MG SL tablet Place 1 tablet (0.4 mg total) under the tongue every 5 (five) minutes as needed for chest pain. (Patient not taking: Reported on 03/15/2016) 25 tablet 3   No current facility-administered medications for this visit.    Facility-Administered Medications Ordered in Other Visits  Medication Dose Route Frequency Provider Last Rate Last Dose  . heparin lock flush 100 unit/mL  500 Units Intravenous Once Wyatt Portela, MD      . sodium chloride flush (NS) 0.9 % injection 10 mL  10 mL Intravenous PRN Wyatt Portela, MD         Allergies:  Allergies  Allergen Reactions  . Doxil [Doxorubicin Hcl Liposomal] Anaphylaxis    1st Doxil.   . Pollen Extract Other (See Comments)    Pollen and grass causes a lot sneezing    Past Medical History, Surgical history, Social history, and Family History were reviewed and updated.   Physical Exam: Blood pressure (!) 137/49, pulse 69, temperature 98.2 F (36.8 C), temperature source Oral, resp. rate 18, height '5\' 1"'$  (1.549 m), weight 161 lb (73 kg), SpO2 100  %.  ECOG: 1 General appearance: Well appearing woman appeared comfortable. Head: Normocephalic, without obvious abnormality. No oral ulcers or lesions. Neck: no adenopathy Lymph nodes: Cervical, supraclavicular, and axillary nodes normal. Heart:regular rate and rhythm, S1, S2 normal, no murmur, click, rub or gallop Lung:chest clear, no wheezing, rales, normal symmetric air entry Chest wall examination: Well-healed scar in the anterior mid chest wall. Port-A-Cath site appeared not involved. Abdomin: Soft, nontender with good bowel sounds. No shifting dullness or ascites. EXT:no erythema, induration, or nodules. No edema. Skin: No rashes or lesions. No ecchymosis.  Lab Results: Lab Results  Component Value Date   WBC 3.0 (L) 03/15/2016   HGB 9.7 (L) 03/15/2016   HCT 31.3 (L) 03/15/2016   MCV 96.9 03/15/2016   PLT 63 (L) 03/15/2016     Chemistry      Component Value Date/Time   NA 142 02/23/2016 0917   K 4.4 02/23/2016 0917   CL 104 01/09/2016 0441   CO2 26 02/23/2016 0917  BUN 9.8 02/23/2016 0917   CREATININE 0.7 02/23/2016 0917      Component Value Date/Time   CALCIUM 9.3 02/23/2016 0917   ALKPHOS 92 02/23/2016 0917   AST 13 02/23/2016 0917   ALT 8 02/23/2016 0917   BILITOT 0.27 02/23/2016 0917       Results for IVADELL, GAUL (MRN [id number]) as of 03/15/2016 10:04  Ref. Range 12/22/2015 08:22 02/02/2016 09:58 02/23/2016 09:17  Cancer Antigen (CA) 125 Latest Ref Range: 0.0 - 38.1 U/mL 39.1 (H) 32.4 31.7      Impression and Plan:  61 year old woman with the following issues:  1. Peritoneal carcinomatosis with omental involvement. The pathology confirmed the presence of adenocarcinoma of likely GYN etiology. Her CA 125 was elevated at 333.5 on 09/19/2012. She is status post systemic chemotherapy with an excellent response utilizing carboplatin and Taxotere and a Avastin . This therapy was held in June of 2015 and currently on Avastin maintenance.   CT scan  obtained on 05/05/2015 as well as her tumor markers showed clear progression. Her CA-125 is up to 189 and her CT scan showed increase in her peritoneal disease.   She is currently receiving salvage therapy with carboplatin single agent at AUC of 5 With excellent response.  CT scan obtained on 02/17/2016 continues to show positive response to therapy. Her omental metastasis is decreasing slightly compared to previous examination.  Risks and benefits of continuing this medication was reviewed today and she is agreeable to proceed. We'll repeat imaging studies in 3 months.  2. IV access: Port-A-Cath is used for chemotherapy without complications.  3. Thrombocytopenia: Her platelets remained relatively stable. Thrombocytopenia is related to treatment as well as possible early cirrhosis of the liver. No bleeding complications noted since last visit.  4. Abdominal distention: No ascites noted on ultrasound unlikely related to peritoneal carcinomatosis. No changes noted since her last visit.  5. Left breast mass: She status post biopsy with the pathology indicating T2 N1 disease with the tumor have ER positive, PR negative HER-2 negative. Her Ki-67 is 50%.  The plan is to repeat mammogram in the near future for a surveillance purposes and assess her tumor size. I would recommend primary surgical therapy for her breast cancer only if her tumor is growing dramatically. Her outcome and overall survival is dictated by her ovarian and omental metastasis rather than early breast cancer.  8. Neutropenia: Neulasta will be given after a cycles of therapy.  9. Chest wall cellulitis: This is resolved at this time.  10. Nausea: Prescription for Compazine will be made available to patient in addition to Zofran. Nausea we'll controlled.  11. Hypertension: Blood pressure is adequately controlled at this time.  12. The followup: She will in 3 weeks for the next cycle of therapy.    [facility],  MD 1/25/201810:13 AM

## 2016-03-15 NOTE — Telephone Encounter (Signed)
Message sent to infusion scheduler to be added per 03/15/16 los. Appointments scheduled per 03/15/16 los. Patient refused a copy of the appointment schedule and AVS report, per 03/15/16 los.

## 2016-03-15 NOTE — Progress Notes (Signed)
Okay to treat today despite Platelets = 63 and Neut = 1.4, per Dr. Alen Blew.

## 2016-03-15 NOTE — Patient Instructions (Addendum)
[facility] Discharge Instructions for Patients Receiving Chemotherapy  Today you received the following chemotherapy agents :  Carboplatin.  To help prevent nausea and vomiting after your treatment, we encourage you to take your nausea medication as prescribed.   If you develop nausea and vomiting that is not controlled by your nausea medication, call the clinic.   BELOW ARE SYMPTOMS THAT SHOULD BE REPORTED IMMEDIATELY:  *FEVER GREATER THAN 100.5 F  *CHILLS WITH OR WITHOUT FEVER  NAUSEA AND VOMITING THAT IS NOT CONTROLLED WITH YOUR NAUSEA MEDICATION  *UNUSUAL SHORTNESS OF BREATH  *UNUSUAL BRUISING OR BLEEDING  TENDERNESS IN MOUTH AND THROAT WITH OR WITHOUT PRESENCE OF ULCERS  *URINARY PROBLEMS  *BOWEL PROBLEMS  UNUSUAL RASH Items with * indicate a potential emergency and should be followed up as soon as possible.  Feel free to call the clinic you have any questions or concerns. The clinic phone number is [phone].  Please show the CHEMO ALERT CARD at check-in to the Emergency Department and triage nurse.   

## 2016-03-15 NOTE — Telephone Encounter (Signed)
Per 1/25 LOS ans staff message I have scheduled appt and gave calendar

## 2016-03-16 ENCOUNTER — Ambulatory Visit (HOSPITAL_COMMUNITY): Payer: PPO

## 2016-03-16 ENCOUNTER — Ambulatory Visit: Payer: PPO

## 2016-03-16 ENCOUNTER — Encounter (HOSPITAL_COMMUNITY): Payer: PPO

## 2016-03-16 ENCOUNTER — Ambulatory Visit (HOSPITAL_BASED_OUTPATIENT_CLINIC_OR_DEPARTMENT_OTHER): Payer: PPO

## 2016-03-16 VITALS — BP 131/69 | HR 73 | Temp 98.7°F | Resp 18

## 2016-03-16 DIAGNOSIS — C482 Malignant neoplasm of peritoneum, unspecified: Secondary | ICD-10-CM | POA: Diagnosis not present

## 2016-03-16 DIAGNOSIS — Z5189 Encounter for other specified aftercare: Secondary | ICD-10-CM | POA: Diagnosis not present

## 2016-03-16 LAB — CA 125: CANCER ANTIGEN (CA) 125: 29.1 U/mL (ref 0.0–38.1)

## 2016-03-16 MED ORDER — PEGFILGRASTIM INJECTION 6 MG/0.6ML ~~LOC~~
6.0000 mg | PREFILLED_SYRINGE | Freq: Once | SUBCUTANEOUS | Status: AC
  Administered 2016-03-16: 6 mg via SUBCUTANEOUS
  Filled 2016-03-16: qty 0.6

## 2016-03-16 NOTE — Patient Instructions (Signed)
Pegfilgrastim injection What is this medicine? PEGFILGRASTIM (PEG fil gra stim) is a long-acting granulocyte colony-stimulating factor that stimulates the growth of neutrophils, a type of white blood cell important in the body's fight against infection. It is used to reduce the incidence of fever and infection in patients with certain types of cancer who are receiving chemotherapy that affects the bone marrow, and to increase survival after being exposed to high doses of radiation. This medicine may be used for other purposes; ask your health care provider or pharmacist if you have questions. COMMON BRAND NAME(S): Neulasta What should I tell my health care provider before I take this medicine? They need to know if you have any of these conditions: -kidney disease -latex allergy -ongoing radiation therapy -sickle cell disease -skin reactions to acrylic adhesives (On-Body Injector only) -an unusual or allergic reaction to pegfilgrastim, filgrastim, other medicines, foods, dyes, or preservatives -pregnant or trying to get pregnant -breast-feeding How should I use this medicine? This medicine is for injection under the skin. If you get this medicine at home, you will be taught how to prepare and give the pre-filled syringe or how to use the On-body Injector. Refer to the patient Instructions for Use for detailed instructions. Use exactly as directed. Take your medicine at regular intervals. Do not take your medicine more often than directed. It is important that you put your used needles and syringes in a special sharps container. Do not put them in a trash can. If you do not have a sharps container, call your pharmacist or healthcare provider to get one. Talk to your pediatrician regarding the use of this medicine in children. While this drug may be prescribed for selected conditions, precautions do apply. Overdosage: If you think you have taken too much of this medicine contact a poison control  center or emergency room at once. NOTE: This medicine is only for you. Do not share this medicine with others. What if I miss a dose? It is important not to miss your dose. Call your doctor or health care professional if you miss your dose. If you miss a dose due to an On-body Injector failure or leakage, a new dose should be administered as soon as possible using a single prefilled syringe for manual use. What may interact with this medicine? Interactions have not been studied. Give your health care provider a list of all the medicines, herbs, non-prescription drugs, or dietary supplements you use. Also tell them if you smoke, drink alcohol, or use illegal drugs. Some items may interact with your medicine. This list may not describe all possible interactions. Give your health care provider a list of all the medicines, herbs, non-prescription drugs, or dietary supplements you use. Also tell them if you smoke, drink alcohol, or use illegal drugs. Some items may interact with your medicine. What should I watch for while using this medicine? You may need blood work done while you are taking this medicine. If you are going to need a MRI, CT scan, or other procedure, tell your doctor that you are using this medicine (On-Body Injector only). What side effects may I notice from receiving this medicine? Side effects that you should report to your doctor or health care professional as soon as possible: -allergic reactions like skin rash, itching or hives, swelling of the face, lips, or tongue -dizziness -fever -pain, redness, or irritation at site where injected -pinpoint red spots on the skin -red or dark-brown urine -shortness of breath or breathing problems -stomach or   side pain, or pain at the shoulder -swelling -tiredness -trouble passing urine or change in the amount of urine Side effects that usually do not require medical attention (report to your doctor or health care professional if they  continue or are bothersome): -bone pain -muscle pain This list may not describe all possible side effects. Call your doctor for medical advice about side effects. You may report side effects to FDA at 1-800-FDA-1088. Where should I keep my medicine? Keep out of the reach of children. Store pre-filled syringes in a refrigerator between 2 and 8 degrees C (36 and 46 degrees F). Do not freeze. Keep in carton to protect from light. Throw away this medicine if it is left out of the refrigerator for more than 48 hours. Throw away any unused medicine after the expiration date. NOTE: This sheet is a summary. It may not cover all possible information. If you have questions about this medicine, talk to your doctor, pharmacist, or health care provider.  2017 [employer] Standard (2014-02-25 14:30:14)  

## 2016-03-19 ENCOUNTER — Ambulatory Visit (HOSPITAL_COMMUNITY): Payer: PPO

## 2016-03-19 ENCOUNTER — Encounter (HOSPITAL_COMMUNITY): Payer: PPO

## 2016-03-20 ENCOUNTER — Ambulatory Visit
Admission: RE | Admit: 2016-03-20 | Discharge: 2016-03-20 | Disposition: A | Discharge status: Home / Self Care | Payer: PPO | Source: Ambulatory Visit | Attending: Oncology | Admitting: Oncology

## 2016-03-20 DIAGNOSIS — N6320 Unspecified lump in the left breast, unspecified quadrant: Secondary | ICD-10-CM

## 2016-03-20 DIAGNOSIS — R928 Other abnormal and inconclusive findings on diagnostic imaging of breast: Secondary | ICD-10-CM | POA: Diagnosis not present

## 2016-03-20 DIAGNOSIS — C482 Malignant neoplasm of peritoneum, unspecified: Secondary | ICD-10-CM

## 2016-03-20 DIAGNOSIS — Z170 Estrogen receptor positive status [ER+]: Principal | ICD-10-CM

## 2016-03-20 DIAGNOSIS — C50412 Malignant neoplasm of upper-outer quadrant of left female breast: Secondary | ICD-10-CM

## 2016-03-21 ENCOUNTER — Ambulatory Visit (HOSPITAL_COMMUNITY): Payer: PPO

## 2016-03-21 ENCOUNTER — Encounter (HOSPITAL_COMMUNITY): Payer: PPO

## 2016-03-23 ENCOUNTER — Encounter (HOSPITAL_COMMUNITY): Payer: PPO

## 2016-03-23 ENCOUNTER — Ambulatory Visit (HOSPITAL_COMMUNITY): Payer: PPO

## 2016-03-23 DIAGNOSIS — C50912 Malignant neoplasm of unspecified site of left female breast: Secondary | ICD-10-CM | POA: Diagnosis not present

## 2016-03-26 ENCOUNTER — Encounter (HOSPITAL_COMMUNITY): Payer: PPO

## 2016-03-26 ENCOUNTER — Ambulatory Visit (HOSPITAL_COMMUNITY): Payer: PPO

## 2016-03-26 ENCOUNTER — Encounter: Payer: Self-pay | Admitting: *Deleted

## 2016-03-27 ENCOUNTER — Encounter: Payer: Self-pay | Admitting: *Deleted

## 2016-03-28 ENCOUNTER — Ambulatory Visit (HOSPITAL_COMMUNITY): Payer: PPO

## 2016-03-28 ENCOUNTER — Encounter (HOSPITAL_COMMUNITY): Payer: PPO

## 2016-03-28 DIAGNOSIS — F422 Mixed obsessional thoughts and acts: Secondary | ICD-10-CM | POA: Diagnosis not present

## 2016-03-30 ENCOUNTER — Ambulatory Visit (HOSPITAL_COMMUNITY): Payer: PPO

## 2016-03-30 ENCOUNTER — Encounter (HOSPITAL_COMMUNITY): Payer: PPO

## 2016-04-02 ENCOUNTER — Encounter (HOSPITAL_COMMUNITY): Payer: PPO

## 2016-04-02 ENCOUNTER — Ambulatory Visit (HOSPITAL_COMMUNITY): Payer: PPO

## 2016-04-04 ENCOUNTER — Encounter (HOSPITAL_COMMUNITY): Payer: PPO

## 2016-04-04 ENCOUNTER — Ambulatory Visit (HOSPITAL_COMMUNITY): Payer: PPO

## 2016-04-05 ENCOUNTER — Ambulatory Visit (HOSPITAL_BASED_OUTPATIENT_CLINIC_OR_DEPARTMENT_OTHER): Payer: PPO | Admitting: Oncology

## 2016-04-05 ENCOUNTER — Telehealth: Payer: Self-pay | Admitting: Oncology

## 2016-04-05 ENCOUNTER — Ambulatory Visit: Payer: PPO

## 2016-04-05 ENCOUNTER — Ambulatory Visit (HOSPITAL_BASED_OUTPATIENT_CLINIC_OR_DEPARTMENT_OTHER): Payer: PPO

## 2016-04-05 ENCOUNTER — Encounter: Payer: Self-pay | Admitting: *Deleted

## 2016-04-05 ENCOUNTER — Other Ambulatory Visit (HOSPITAL_BASED_OUTPATIENT_CLINIC_OR_DEPARTMENT_OTHER): Payer: PPO

## 2016-04-05 VITALS — BP 130/75 | HR 61 | Temp 98.2°F | Resp 18 | Wt 161.4 lb

## 2016-04-05 DIAGNOSIS — D696 Thrombocytopenia, unspecified: Secondary | ICD-10-CM

## 2016-04-05 DIAGNOSIS — C50412 Malignant neoplasm of upper-outer quadrant of left female breast: Secondary | ICD-10-CM

## 2016-04-05 DIAGNOSIS — Z853 Personal history of malignant neoplasm of breast: Secondary | ICD-10-CM | POA: Diagnosis not present

## 2016-04-05 DIAGNOSIS — Z95828 Presence of other vascular implants and grafts: Secondary | ICD-10-CM

## 2016-04-05 DIAGNOSIS — C482 Malignant neoplasm of peritoneum, unspecified: Secondary | ICD-10-CM

## 2016-04-05 DIAGNOSIS — Z170 Estrogen receptor positive status [ER+]: Principal | ICD-10-CM

## 2016-04-05 DIAGNOSIS — Z5111 Encounter for antineoplastic chemotherapy: Secondary | ICD-10-CM

## 2016-04-05 DIAGNOSIS — C50919 Malignant neoplasm of unspecified site of unspecified female breast: Secondary | ICD-10-CM

## 2016-04-05 LAB — COMPREHENSIVE METABOLIC PANEL
ALT: 8 U/L (ref 0–55)
ANION GAP: 9 meq/L (ref 3–11)
AST: 13 U/L (ref 5–34)
Albumin: 3.5 g/dL (ref 3.5–5.0)
Alkaline Phosphatase: 91 U/L (ref 40–150)
BUN: 10.4 mg/dL (ref 7.0–26.0)
CHLORIDE: 106 meq/L (ref 98–109)
CO2: 26 mEq/L (ref 22–29)
CREATININE: 0.6 mg/dL (ref 0.6–1.1)
Calcium: 9 mg/dL (ref 8.4–10.4)
EGFR: >90 mL/min/{1.73_m2} (ref >90)
Glucose: 90 mg/dl (ref 70–140)
POTASSIUM: 4.4 meq/L (ref 3.5–5.1)
Sodium: 141 mEq/L (ref 136–145)
Total Bilirubin: 0.38 mg/dL (ref 0.20–1.20)
Total Protein: 7 g/dL (ref 6.4–8.3)

## 2016-04-05 LAB — CBC WITH DIFFERENTIAL/PLATELET
BASO%: 0.4 % (ref 0.0–2.0)
BASOS ABS: 0 10*3/uL (ref 0.0–0.1)
EOS%: 1.6 % (ref 0.0–7.0)
Eosinophils Absolute: 0 10*3/uL (ref 0.0–0.5)
HCT: 29.4 % — ABNORMAL LOW (ref 34.8–46.6)
HGB: 9.4 g/dL — ABNORMAL LOW (ref 11.6–15.9)
LYMPH%: 38.5 % (ref 14.0–49.7)
MCH: 30.7 pg (ref 25.1–34.0)
MCHC: 32 g/dL (ref 31.5–36.0)
MCV: 96.1 fL (ref 79.5–101.0)
MONO#: 0.4 10*3/uL (ref 0.1–0.9)
MONO%: 13.5 % (ref 0.0–14.0)
NEUT#: 1.4 10*3/uL — ABNORMAL LOW (ref 1.5–6.5)
NEUT%: 46 % (ref 38.4–76.8)
Platelets: 75 10*3/uL — ABNORMAL LOW (ref 145–400)
RBC: 3.06 10*6/uL — AB (ref 3.70–5.45)
RDW: 19.6 % — ABNORMAL HIGH (ref 11.2–14.5)
WBC: 3 10*3/uL — ABNORMAL LOW (ref 3.9–10.3)
lymph#: 1.1 10*3/uL (ref 0.9–3.3)

## 2016-04-05 MED ORDER — PALONOSETRON HCL INJECTION 0.25 MG/5ML
INTRAVENOUS | Status: AC
  Filled 2016-04-05: qty 5

## 2016-04-05 MED ORDER — DEXAMETHASONE SODIUM PHOSPHATE 10 MG/ML IJ SOLN
INTRAMUSCULAR | Status: AC
Start: 2016-04-05 — End: 2016-04-05
  Filled 2016-04-05: qty 1

## 2016-04-05 MED ORDER — CARBOPLATIN CHEMO INTRADERMAL TEST DOSE 100MCG/0.02ML
100.0000 ug | Freq: Once | INTRADERMAL | Status: AC
  Administered 2016-04-05: 100 ug via INTRADERMAL
  Filled 2016-04-05: qty 0.02

## 2016-04-05 MED ORDER — SODIUM CHLORIDE 0.9 % IV SOLN
570.0000 mg | Freq: Once | INTRAVENOUS | Status: AC
  Administered 2016-04-05: 570 mg via INTRAVENOUS
  Filled 2016-04-05: qty 57

## 2016-04-05 MED ORDER — SODIUM CHLORIDE 0.9% FLUSH
10.0000 mL | INTRAVENOUS | Status: DC | PRN
  Administered 2016-04-05: 10 mL
  Filled 2016-04-05: qty 10

## 2016-04-05 MED ORDER — SODIUM CHLORIDE 0.9 % IV SOLN
Freq: Once | INTRAVENOUS | Status: AC
  Administered 2016-04-05: 13:00:00 via INTRAVENOUS

## 2016-04-05 MED ORDER — PALONOSETRON HCL INJECTION 0.25 MG/5ML
0.2500 mg | Freq: Once | INTRAVENOUS | Status: AC
  Administered 2016-04-05: 0.25 mg via INTRAVENOUS

## 2016-04-05 MED ORDER — HEPARIN SOD (PORK) LOCK FLUSH 100 UNIT/ML IV SOLN
500.0000 [IU] | Freq: Once | INTRAVENOUS | Status: AC | PRN
  Administered 2016-04-05: 500 [IU]
  Filled 2016-04-05: qty 5

## 2016-04-05 MED ORDER — DEXAMETHASONE SODIUM PHOSPHATE 10 MG/ML IJ SOLN
4.0000 mg | Freq: Once | INTRAMUSCULAR | Status: AC
  Administered 2016-04-05: 4 mg via INTRAVENOUS

## 2016-04-05 MED ORDER — SODIUM CHLORIDE 0.9 % IJ SOLN
10.0000 mL | INTRAMUSCULAR | Status: DC | PRN
  Administered 2016-04-05: 10 mL via INTRAVENOUS
  Filled 2016-04-05: qty 10

## 2016-04-05 NOTE — Progress Notes (Signed)
Hematology and Oncology Follow Up Visit  [name] [id number] 1955/03/15 61 y.o. 04/05/2016 11:56 AM    Principle Diagnosis: 61 year old woman diagnosed with:  1. 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 This was diagnosed in July of 2014. 2. 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 The tumor is ER positive PR negative, HER-2 negative.   Prior Therapy:  She is status post paracentesis performed on 09/17/2012 with the cytology confirmed the presence of adenocarcinoma and immunohistochemical stains suggest GYN etiology. She is also status post lumpectomy for breast cancer diagnosed 25 years ago followed by radiation and chemotherapy under the care of Dr. Sonny Dandy. She did not receive any hormonal therapy. Chemotherapy utilizing carboplatin and Taxotere started on 10/01/2012. Avastin was added with cycle 4. This was discontinued in June of 2015. She is S/P Avastin maintenance only between June 2015 till March 2017. Therapy discontinued due to progression of disease. She is status post Doxil salvage chemotherapy therapy discontinued because of hypersensitivity reaction in March 2017.  Current therapy:  Carboplatin and AUC of 5 every 3 weeks cycle 1 started on 07/08/2015. She is here for cycle 13 of therapy.  Interim History: [name] presents today for a followup visit with her husband. Since her last visit, she received her last cycle of chemotherapy without any major complications. She did report some mild fatigue and tiredness associated with Neulasta. Nausea was better controlled this time without any vomiting. She denied any infusion related complications. She denied any abdominal pain or satiety. She is able to eat well and have gained weight. She remains active and attends to activities of daily living without any decline.   She does not report any  headaches, blurry vision, syncope or seizures. She denied any fever chills. She does report some chest discomfort but no dyspnea on exertion. Has not reported any shortness of breath or cough. She denied any pain with swallowing. She does not report any vomiting, constipation does report some occasional diarrhea. She denied any GYN bleeding. She reports no anxiety or depression. Remainder of the review of systems is unremarkable.  Medications: I have reviewed the patient's current medications.  Current Outpatient Prescriptions  Medication Sig Dispense Refill  . acetaminophen (TYLENOL) 325 MG tablet Take 2 tablets (650 mg total) by mouth every 4 (four) hours as needed for mild pain, fever or headache. 30 tablet 0  . Acetylcarnitine HCl (ACETYL L-CARNITINE PO) Take 2,000 mg by mouth at bedtime.    . ALPRAZolam (XANAX) 0.5 MG tablet Take 0.5 mg by mouth 2 (two) times daily as needed for anxiety.    Marland Kitchen aspirin 81 MG chewable tablet Chew 1 tablet (81 mg total) by mouth daily. 30 tablet 0  . atorvastatin (LIPITOR) 40 MG tablet Take 1 tablet (40 mg total) by mouth daily at 6 PM. 30 tablet 0  . carvedilol (COREG) 3.125 MG tablet Take 1/2 tablet by mouth twice daily with meals. 30 tablet 9  . clopidogrel (PLAVIX) 75 MG tablet Take 1 tablet (75 mg total) by mouth daily with breakfast. 30 tablet 0  . clotrimazole-betamethasone (LOTRISONE) cream Apply 1 application topically 2 (two) times daily. 30 g 1  . divalproex (DEPAKOTE) 250 MG DR tablet Take by mouth. Take 516ms in the morning and 7579m at night    . fluticasone (FLONASE) 50 MCG/ACT nasal spray INHALE 2 SPRAYS INTO EACH NOSTRIL EVERY  NIGHT  11  . gabapentin (NEURONTIN) 100 MG capsule Take 1 capsule (100 mg total) by mouth 2 (two) times daily. 180 capsule 0  . lamoTRIgine (LAMICTAL) 100 MG tablet Take 100 mg by mouth 2 (two) times daily.    Marland Kitchen levothyroxine (SYNTHROID, LEVOTHROID) 100 MCG tablet Take 100 mcg by mouth daily before breakfast.     .  lidocaine-prilocaine (EMLA) cream Apply topically as needed. Apply to port with every chemotherapy. 30 g 0  . loperamide (IMODIUM A-D) 2 MG tablet Take 2 mg by mouth as needed for diarrhea or loose stools.    Marland Kitchen loratadine (CLARITIN) 10 MG tablet Take 10 mg by mouth daily as needed for allergies (with chemo).    . nitroGLYCERIN (NITROSTAT) 0.4 MG SL tablet Place 1 tablet (0.4 mg total) under the tongue every 5 (five) minutes as needed for chest pain. 25 tablet 3  . ondansetron (ZOFRAN) 8 MG tablet TAKE 1 TABLET BY MOUTH EVERY 8 HOURS AS NEEDED FOR NAUSEA AND VOMITING 30 tablet 2  . pantoprazole (PROTONIX) 40 MG tablet Take 1 tablet (40 mg total) by mouth daily. 30 tablet 11  . prochlorperazine (COMPAZINE) 10 MG tablet Take 1 tablet (10 mg total) by mouth every 6 (six) hours as needed for nausea or vomiting. 30 tablet 0  . rOPINIRole (REQUIP) 0.5 MG tablet Take 1.5 mg by mouth at bedtime.     . sertraline (ZOLOFT) 50 MG tablet      No current facility-administered medications for this visit.    Facility-Administered Medications Ordered in Other Visits  Medication Dose Route Frequency Provider Last Rate Last Dose  . heparin lock flush 100 unit/mL  500 Units Intravenous Once Wyatt Portela, MD      . sodium chloride flush (NS) 0.9 % injection 10 mL  10 mL Intravenous PRN Wyatt Portela, MD         Allergies:  Allergies  Allergen Reactions  . Doxil [Doxorubicin Hcl Liposomal] Anaphylaxis    1st Doxil.   . Pollen Extract Other (See Comments)    Pollen and grass causes a lot sneezing    Past Medical History, Surgical history, Social history, and Family History were reviewed and updated.   Physical Exam: Blood pressure 130/75, pulse 61, temperature 98.2 F (36.8 C), temperature source Oral, resp. rate 18, weight 161 lb 6.4 oz (73.2 kg), SpO2 100 %.  ECOG: 1 General appearance: Alert, awake woman without distress. Head: Normocephalic, without obvious abnormality. No oral thrush  noted. Neck: no adenopathy Lymph nodes: Cervical, supraclavicular, and axillary nodes normal. Heart:regular rate and rhythm, S1, S2 normal, no murmur, click, rub or gallop Lung:chest clear, no wheezing, rales, normal symmetric air entry Chest wall examination: Well-healed scar in the anterior mid chest wall. Port-A-Cath site appeared normal. Abdomin: Soft, nontender with good bowel sounds. No shifting dullness or ascites. EXT:no erythema, induration, or nodules. No edema. Skin: No rashes or lesions. No ecchymosis.  Lab Results: Lab Results  Component Value Date   WBC 3.0 (L) 04/05/2016   HGB 9.4 (L) 04/05/2016   HCT 29.4 (L) 04/05/2016   MCV 96.1 04/05/2016   PLT 75 (L) 04/05/2016     Chemistry      Component Value Date/Time   NA 142 02/23/2016 0917   K 4.4 02/23/2016 0917   CL 104 01/09/2016 0441   CO2 26 02/23/2016 0917   BUN 9.8 02/23/2016 0917   CREATININE 0.7 02/23/2016 0917      Component Value Date/Time  CALCIUM 9.3 02/23/2016 0917   ALKPHOS 92 02/23/2016 0917   AST 13 02/23/2016 0917   ALT 8 02/23/2016 0917   BILITOT 0.27 02/23/2016 0917       IMPRESSION: Biopsy-proven left breast cancer and left axillary lymph node metastases. No recurrent or enlarging mass is identified within the upper left breast at the site of the ribbon shaped biopsy clip. No new abnormality is identified in the left breast.  Decreased number of the enlarged lymph nodes in the left axilla suggests a positive response to patient's interval chemotherapy.    Impression and Plan:  61 year old woman with the following issues:  1. Peritoneal carcinomatosis with omental involvement. The pathology confirmed the presence of adenocarcinoma of likely GYN etiology. Her CA 125 was elevated at 333.5 on 09/19/2012. She is status post systemic chemotherapy with an excellent response utilizing carboplatin and Taxotere and a Avastin . This therapy was held in June of 2015 and currently on  Avastin maintenance.   CT scan obtained on 05/05/2015 as well as her tumor markers showed clear progression. Her CA-125 is up to 189 and her CT scan showed increase in her peritoneal disease.   She is currently receiving salvage therapy with carboplatin single agent at AUC of 5 With excellent response.  CT scan obtained on 02/17/2016 continues to show positive response to therapy. Her omental metastasis is decreasing slightly compared to previous examination.  The plan is to continue the same dose and schedule of this treatment and repeat imaging studies in March 2018.  2. IV access: Port-A-Cath is used for chemotherapy without complications.  3. Thrombocytopenia: Her platelets improved since the last visit without any active bleeding.  4. Abdominal distention: No ascites noted on ultrasound unlikely related to peritoneal carcinomatosis. No changes noted since her last visit.  5. Left breast mass: She status post biopsy with the pathology indicating T2 N1 disease with the tumor have ER positive, PR negative HER-2 negative. Her Ki-67 is 50%.  Mammogram obtained on 03/20/2016 was reviewed and showed excellent response to systemic chemotherapy. Given her primary ovarian malignancy, para surgical therapy for her breast cancer is not indicated at this time. This will be a possibility in the future if she develops advanced disease.  8. Neutropenia: Neulasta will be given after a cycles of therapy.  9. Chest wall cellulitis: This is resolved at this time.  10. Nausea: Prescription for Compazine will be made available to patient in addition to Zofran.   11. Hypertension: Blood pressure is adequately controlled at this time.  12. The followup: She will in 3 weeks for the next cycle of therapy.    [facility], MD 2/15/201811:56 AM

## 2016-04-05 NOTE — Telephone Encounter (Signed)
Appointments complete per 2/15 los. Patient declined print out - uses my chart.

## 2016-04-05 NOTE — Patient Instructions (Signed)
[place] Discharge Instructions for Patients Receiving Chemotherapy  Today you received the following chemotherapy agents Carboplatin   To help prevent nausea and vomiting after your treatment, we encourage you to take your nausea medication as   If you develop nausea and vomiting that is not controlled by your nausea medication, call the clinic.   BELOW ARE SYMPTOMS THAT SHOULD BE REPORTED IMMEDIATELY:  *FEVER GREATER THAN 100.5 F  *CHILLS WITH OR WITHOUT FEVER  NAUSEA AND VOMITING THAT IS NOT CONTROLLED WITH YOUR NAUSEA MEDICATION  *UNUSUAL SHORTNESS OF BREATH  *UNUSUAL BRUISING OR BLEEDING  TENDERNESS IN MOUTH AND THROAT WITH OR WITHOUT PRESENCE OF ULCERS  *URINARY PROBLEMS  *BOWEL PROBLEMS  UNUSUAL RASH Items with * indicate a potential emergency and should be followed up as soon as possible.  Feel free to call the clinic you have any questions or concerns. The clinic phone number is [phone].  Please show the [facility] at check-in to the Emergency Department and triage nurse.

## 2016-04-05 NOTE — Progress Notes (Signed)
[place]reat with platelet count and ANC today per [facility]

## 2016-04-06 ENCOUNTER — Encounter (HOSPITAL_COMMUNITY): Payer: PPO

## 2016-04-06 ENCOUNTER — Ambulatory Visit (HOSPITAL_COMMUNITY): Payer: PPO

## 2016-04-06 LAB — CA 125: CANCER ANTIGEN (CA) 125: 25 U/mL (ref 0.0–38.1)

## 2016-04-07 ENCOUNTER — Ambulatory Visit (HOSPITAL_BASED_OUTPATIENT_CLINIC_OR_DEPARTMENT_OTHER): Payer: PPO

## 2016-04-07 VITALS — BP 125/70 | HR 68 | Temp 98.4°F | Resp 18

## 2016-04-07 DIAGNOSIS — D701 Agranulocytosis secondary to cancer chemotherapy: Secondary | ICD-10-CM

## 2016-04-07 DIAGNOSIS — C482 Malignant neoplasm of peritoneum, unspecified: Secondary | ICD-10-CM | POA: Diagnosis not present

## 2016-04-07 MED ORDER — PEGFILGRASTIM INJECTION 6 MG/0.6ML ~~LOC~~
6.0000 mg | PREFILLED_SYRINGE | Freq: Once | SUBCUTANEOUS | Status: AC
Start: 2016-04-07 — End: 2016-04-07
  Administered 2016-04-07: 6 mg via SUBCUTANEOUS

## 2016-04-07 NOTE — Patient Instructions (Signed)
Pegfilgrastim injection What is this medicine? PEGFILGRASTIM (PEG fil gra stim) is a long-acting granulocyte colony-stimulating factor that stimulates the growth of neutrophils, a type of white blood cell important in the body's fight against infection. It is used to reduce the incidence of fever and infection in patients with certain types of cancer who are receiving chemotherapy that affects the bone marrow, and to increase survival after being exposed to high doses of radiation. This medicine may be used for other purposes; ask your health care provider or pharmacist if you have questions. COMMON BRAND NAME(S): Neulasta What should I tell my health care provider before I take this medicine? They need to know if you have any of these conditions: -kidney disease -latex allergy -ongoing radiation therapy -sickle cell disease -skin reactions to acrylic adhesives (On-Body Injector only) -an unusual or allergic reaction to pegfilgrastim, filgrastim, other medicines, foods, dyes, or preservatives -pregnant or trying to get pregnant -breast-feeding How should I use this medicine? This medicine is for injection under the skin. If you get this medicine at home, you will be taught how to prepare and give the pre-filled syringe or how to use the On-body Injector. Refer to the patient Instructions for Use for detailed instructions. Use exactly as directed. Take your medicine at regular intervals. Do not take your medicine more often than directed. It is important that you put your used needles and syringes in a special sharps container. Do not put them in a trash can. If you do not have a sharps container, call your pharmacist or healthcare provider to get one. Talk to your pediatrician regarding the use of this medicine in children. While this drug may be prescribed for selected conditions, precautions do apply. Overdosage: If you think you have taken too much of this medicine contact a poison control  center or emergency room at once. NOTE: This medicine is only for you. Do not share this medicine with others. What if I miss a dose? It is important not to miss your dose. Call your doctor or health care professional if you miss your dose. If you miss a dose due to an On-body Injector failure or leakage, a new dose should be administered as soon as possible using a single prefilled syringe for manual use. What may interact with this medicine? Interactions have not been studied. Give your health care provider a list of all the medicines, herbs, non-prescription drugs, or dietary supplements you use. Also tell them if you smoke, drink alcohol, or use illegal drugs. Some items may interact with your medicine. This list may not describe all possible interactions. Give your health care provider a list of all the medicines, herbs, non-prescription drugs, or dietary supplements you use. Also tell them if you smoke, drink alcohol, or use illegal drugs. Some items may interact with your medicine. What should I watch for while using this medicine? You may need blood work done while you are taking this medicine. If you are going to need a MRI, CT scan, or other procedure, tell your doctor that you are using this medicine (On-Body Injector only). What side effects may I notice from receiving this medicine? Side effects that you should report to your doctor or health care professional as soon as possible: -allergic reactions like skin rash, itching or hives, swelling of the face, lips, or tongue -dizziness -fever -pain, redness, or irritation at site where injected -pinpoint red spots on the skin -red or dark-brown urine -shortness of breath or breathing problems -stomach or   side pain, or pain at the shoulder -swelling -tiredness -trouble passing urine or change in the amount of urine Side effects that usually do not require medical attention (report to your doctor or health care professional if they  continue or are bothersome): -bone pain -muscle pain This list may not describe all possible side effects. Call your doctor for medical advice about side effects. You may report side effects to FDA at 1-800-FDA-1088. Where should I keep my medicine? Keep out of the reach of children. Store pre-filled syringes in a refrigerator between 2 and 8 degrees C (36 and 46 degrees F). Do not freeze. Keep in carton to protect from light. Throw away this medicine if it is left out of the refrigerator for more than 48 hours. Throw away any unused medicine after the expiration date. NOTE: This sheet is a summary. It may not cover all possible information. If you have questions about this medicine, talk to your doctor, pharmacist, or health care provider.  2017 [employer] Standard (2014-02-25 14:30:14)  

## 2016-04-09 ENCOUNTER — Encounter (HOSPITAL_COMMUNITY): Payer: PPO

## 2016-04-09 ENCOUNTER — Ambulatory Visit (HOSPITAL_COMMUNITY): Payer: PPO

## 2016-04-09 ENCOUNTER — Ambulatory Visit: Payer: PPO | Admitting: Family Medicine

## 2016-04-10 ENCOUNTER — Telehealth: Payer: Self-pay | Admitting: Family Medicine

## 2016-04-10 MED ORDER — ROPINIROLE HCL 0.5 MG PO TABS: 1.5000 mg | ORAL_TABLET | Freq: Every day | ORAL | 1 refills | Status: DC

## 2016-04-10 NOTE — Telephone Encounter (Signed)
Pt need new Rx for Ropinirole   Pharm:  [place]

## 2016-04-10 NOTE — Telephone Encounter (Signed)
Rx sent 

## 2016-04-16 ENCOUNTER — Other Ambulatory Visit (INDEPENDENT_AMBULATORY_CARE_PROVIDER_SITE_OTHER): Payer: PPO

## 2016-04-16 DIAGNOSIS — Z0000 Encounter for general adult medical examination without abnormal findings: Secondary | ICD-10-CM | POA: Diagnosis not present

## 2016-04-16 LAB — LIPID PANEL
CHOL/HDL RATIO: 3
Cholesterol: 120 mg/dL (ref 0–200)
HDL: 34.9 mg/dL — ABNORMAL LOW (ref >39.00)
LDL Cholesterol: 54 mg/dL (ref 0–99)
NONHDL: 85.06
Triglycerides: 153 mg/dL — ABNORMAL HIGH (ref 0.0–149.0)
VLDL: 30.6 mg/dL (ref 0.0–40.0)

## 2016-04-16 LAB — POC URINALSYSI DIPSTICK (AUTOMATED)
BILIRUBIN UA: NEGATIVE
Glucose, UA: NEGATIVE
Leukocytes, UA: NEGATIVE
NITRITE UA: NEGATIVE
PH UA: 5.5
Spec Grav, UA: >=1.03
Urobilinogen, UA: 0.2

## 2016-04-16 LAB — HEPATIC FUNCTION PANEL
ALBUMIN: 3.9 g/dL (ref 3.5–5.2)
ALT: 6 U/L (ref 0–35)
AST: 13 U/L (ref 0–37)
Alkaline Phosphatase: 112 U/L (ref 39–117)
BILIRUBIN TOTAL: 0.3 mg/dL (ref 0.2–1.2)
Bilirubin, Direct: 0.1 mg/dL (ref 0.0–0.3)
Total Protein: 6.8 g/dL (ref 6.0–8.3)

## 2016-04-16 LAB — CBC WITH DIFFERENTIAL/PLATELET
BASOS PCT: 0.3 % (ref 0.0–3.0)
Basophils Absolute: 0 10*3/uL (ref 0.0–0.1)
EOS PCT: 0.5 % (ref 0.0–5.0)
Eosinophils Absolute: 0 10*3/uL (ref 0.0–0.7)
HCT: 29.7 % — ABNORMAL LOW (ref 36.0–46.0)
Hemoglobin: 9.7 g/dL — ABNORMAL LOW (ref 12.0–15.0)
LYMPHS ABS: 1.6 10*3/uL (ref 0.7–4.0)
Lymphocytes Relative: 24 % (ref 12.0–46.0)
MCHC: 32.6 g/dL (ref 30.0–36.0)
MCV: 98.2 fl (ref 78.0–100.0)
MONO ABS: 0.6 10*3/uL (ref 0.1–1.0)
Monocytes Relative: 8.3 % (ref 3.0–12.0)
NEUTROS PCT: 66.9 % (ref 43.0–77.0)
Neutro Abs: 4.6 10*3/uL (ref 1.4–7.7)
Platelets: 82 10*3/uL — ABNORMAL LOW (ref 150.0–400.0)
RBC: 3.03 Mil/uL — AB (ref 3.87–5.11)
RDW: 20.4 % — ABNORMAL HIGH (ref 11.5–15.5)
WBC: 6.8 10*3/uL (ref 4.0–10.5)

## 2016-04-16 LAB — BASIC METABOLIC PANEL
BUN: 8 mg/dL (ref 6–23)
CHLORIDE: 104 meq/L (ref 96–112)
CO2: 30 meq/L (ref 19–32)
Calcium: 9 mg/dL (ref 8.4–10.5)
Creatinine, Ser: 0.58 mg/dL (ref 0.40–1.20)
GFR: 113.28 mL/min (ref >60.00)
GLUCOSE: 92 mg/dL (ref 70–99)
POTASSIUM: 4.1 meq/L (ref 3.5–5.1)
SODIUM: 142 meq/L (ref 135–145)

## 2016-04-16 LAB — TSH: TSH: 1.93 u[IU]/mL (ref 0.35–4.50)

## 2016-04-22 NOTE — Progress Notes (Signed)
HPI:   [name] is a 61 y.o. female, who is here today for her routine physical. She is also following on some of her chronic medical problems. She had labs done recently.   I last saw her on 02/24/16. Since her last OV she has seen oncologists, Dr Alen Blew (04/05/16). Hx of peritoneal carcinomatosis an ascitics thought to be from gyn adenocarcinoma. Invasive ductal carcinoma left breast, grade 3 with positive sentinel lymph node Bx (06/2015). She is currently on chemotherapy, has a Port-A-cath. + Pancytopenia. Lab Results  Component Value Date   WBC 2.3 (L) 04/26/2016   HGB 9.3 (L) 04/26/2016   HCT 30.4 (L) 04/26/2016   MCV 100.3 04/26/2016   PLT 59 (L) 04/26/2016    She follows with oncologists every 3 weeks. Breast surgeon recently seen and 6 months f/u recommended.   She also follows with cardiologists, Dr Harrington Challenger. Hx of CAD and mild diastolic dysfunction. Also Hx of HTN and HLD. -U/A showed + Protein, she denies gross hematuria,foam in urine,dysuria,or decreased urine output.  She follows with gyn periodically, 12/19/15.  Hx of depression and anxiety, she follows with psychiatrists, recently monthly because medications have been adjusted. Sertraline was added since her last OV. She denies suicidal thoughts.   Regular exercise 3 or more time per week: No Following a healthy diet: Yes She lives with her husband.   Mammogram: 03/20/16 Bir[facility] 6. Colonoscopy: 11/2014,internal hemorrhoids. HCV screening,she denies risk factors.  She has some concerns today: A week of fullness sensation left ear and mild dizziness. No associated earache,drainage,or recent URI. + Hx of allergic rhinitis. No Q tip use and no Hx of ear trauma.  -She also wants to be sure she will get refills when requested for some of her medications.  According to pt,she has requested refills for Requip 1 mg and was denied.She has on med list Requip 0.5 mg to take 3 tabs daily at bedtime, this  is the mg she is supposed to be taking but she was thinking she needed 1 mg tab and 0.5 mg tab.She takes medications for RLS, which has been well controlled.   GERD Has not had heartburn since she has been on Protonix 40 mg daily. Takes medication daily.  Plavix.75 mg, she states that it was prescribed by her former PCP. She states that she is not sure what was the indication. Hx of CAD and follows with cardiology regularly.  OSA:  She is also concerned about sleeps apnea. She states that her husband has noted louder snoring and stopping breathing a few times at night. + Fatigue,which she has also attributed to chemo. She reports having prior Dx of OSA about 3 years ago,she did not tolerate CPAP and denies not to wear it. She would like to have sleep study repeated.   Hyperlipidemia:  Currently on Atorvastatin 40 mg daily. Following a low fat diet: Yes.  She has not noted side effects with medication.  Lab Results  Component Value Date   CHOL 120 04/16/2016   HDL 34.90 (L) 04/16/2016   LDLCALC 54 04/16/2016   LDLDIRECT 151.3 08/24/2009   TRIG 153.0 (H) 04/16/2016   CHOLHDL 3 04/16/2016    Hypothyroidism: Currently she is on Levothyroxine 100 mcg daily. Tolerating medication well, no side effects reported. She has not noted dysphagia, palpitations, abdominal pain, changes in bowel habits, tremor, cold/heat intolerance. + Fatigue.  Lab Results  Component Value Date   TSH 1.93 04/16/2016     Review  of Systems  Constitutional: Positive for fatigue. Negative for appetite change, chills, diaphoresis and fever.  HENT: Negative for dental problem, ear discharge, ear pain, mouth sores, nosebleeds, sore throat, trouble swallowing and voice change.   Eyes: Negative for pain and visual disturbance.  Respiratory: Negative for cough, shortness of breath and wheezing.   Cardiovascular: Negative for chest pain and palpitations.  Gastrointestinal: Positive for nausea  (intermitently,usually after chemo treatments.). Negative for abdominal pain and vomiting.       No changes in bowel habits.  Endocrine: Negative for polydipsia, polyphagia and polyuria.  Genitourinary: Negative for decreased urine volume and hematuria.  Musculoskeletal: Negative for gait problem and myalgias.  Skin: Positive for rash (intermittent for years.). Negative for wound.  Allergic/Immunologic: Positive for environmental allergies.  Neurological: Positive for dizziness. Negative for syncope, weakness and headaches.  Psychiatric/Behavioral: Negative for confusion, hallucinations and suicidal ideas. The patient is nervous/anxious.       Current Outpatient Prescriptions on File Prior to Visit  Medication Sig Dispense Refill  . acetaminophen (TYLENOL) 325 MG tablet Take 2 tablets (650 mg total) by mouth every 4 (four) hours as needed for mild pain, fever or headache. 30 tablet 0  . Acetylcarnitine HCl (ACETYL L-CARNITINE PO) Take 2,000 mg by mouth at bedtime.    . ALPRAZolam (XANAX) 0.5 MG tablet Take 0.5 mg by mouth 2 (two) times daily as needed for anxiety.    Marland Kitchen aspirin 81 MG chewable tablet Chew 1 tablet (81 mg total) by mouth daily. 30 tablet 0  . carvedilol (COREG) 3.125 MG tablet Take 1/2 tablet by mouth twice daily with meals. 30 tablet 9  . clopidogrel (PLAVIX) 75 MG tablet Take 1 tablet (75 mg total) by mouth daily with breakfast. 30 tablet 0  . clotrimazole-betamethasone (LOTRISONE) cream Apply 1 application topically 2 (two) times daily. 30 g 1  . divalproex (DEPAKOTE) 250 MG DR tablet Take by mouth. Take 500mg s in the morning and 750mg s at night    . fluticasone (FLONASE) 50 MCG/ACT nasal spray INHALE 2 SPRAYS INTO EACH NOSTRIL EVERY NIGHT  11  . gabapentin (NEURONTIN) 100 MG capsule Take 1 capsule (100 mg total) by mouth 2 (two) times daily. 180 capsule 0  . lamoTRIgine (LAMICTAL) 100 MG tablet Take 100 mg by mouth 2 (two) times daily.    Marland Kitchen lidocaine-prilocaine (EMLA)  cream Apply topically as needed. Apply to port with every chemotherapy. 30 g 0  . loperamide (IMODIUM A-D) 2 MG tablet Take 2 mg by mouth as needed for diarrhea or loose stools.    Marland Kitchen loratadine (CLARITIN) 10 MG tablet Take 10 mg by mouth daily as needed for allergies (with chemo).    . nitroGLYCERIN (NITROSTAT) 0.4 MG SL tablet Place 1 tablet (0.4 mg total) under the tongue every 5 (five) minutes as needed for chest pain. (Patient not taking: Reported on 04/26/2016) 25 tablet 3  . ondansetron (ZOFRAN) 8 MG tablet TAKE 1 TABLET BY MOUTH EVERY 8 HOURS AS NEEDED FOR NAUSEA AND VOMITING 30 tablet 2  . pantoprazole (PROTONIX) 40 MG tablet Take 1 tablet (40 mg total) by mouth daily. 30 tablet 11  . prochlorperazine (COMPAZINE) 10 MG tablet Take 1 tablet (10 mg total) by mouth every 6 (six) hours as needed for nausea or vomiting. 30 tablet 0  . rOPINIRole (REQUIP) 0.5 MG tablet Take 3 tablets (1.5 mg total) by mouth at bedtime. 135 tablet 1  . sertraline (ZOLOFT) 50 MG tablet      Current Facility-Administered  Medications on File Prior to Visit  Medication Dose Route Frequency Provider Last Rate Last Dose  . heparin lock flush 100 unit/mL  500 Units Intravenous Once Wyatt Portela, MD      . sodium chloride flush (NS) 0.9 % injection 10 mL  10 mL Intravenous PRN Wyatt Portela, MD         Past Medical History:  Diagnosis Date  . ADD (attention deficit disorder)   . Allergy    seasonal  . Anxiety   . Bipolar disorder ([place])   . Breast cancer ([place]) 1989   right breast  . Cancer ([place])    Omentum  . Coronary artery disease   . Depression   . Hematochezia   . History of echocardiogram    a. Limited Echo 9/17: EF 60-65%, no RWMA, Gr 1 DD, trivial AI, trivial MR, GLS -12% (likely underestimated), no pericardial effusion  . Hyperlipidemia   . Hypothyroidism   . Maintenance chemotherapy    Pt has chemo every 3 weeks (on Friday)  . NSTEMI (non-ST elevated myocardial infarction) ([place]) 10/26/2015  .  Osteopenia 03/2009   t score -2.1 FRAX 4.6/0.4  . Premature menopause   . Sleep apnea    mild    Allergies  Allergen Reactions  . Doxil [Doxorubicin Hcl Liposomal] Anaphylaxis    1st Doxil.   . Pollen Extract Other (See Comments)    Pollen and grass causes a lot sneezing    Family History  Problem Relation Age of Onset  . Breast cancer Maternal Aunt 29  . Diabetes Maternal Grandmother   . Heart disease Maternal Grandmother   . Heart disease Maternal Grandfather   . Hypertension Paternal Grandfather   . Heart Problems Paternal Grandfather   . Breast cancer Maternal Aunt     dx. early 64s; had negative GT approx 10 years ago  . Breast cancer Maternal Aunt     dx. 78s with recurrence  . Allergies Father   . Heart disease Mother   . Other Mother     hx of hysterectomy   . Liver cancer Cousin 19    +EtOH  . Cancer Cousin     paternal 1st cousin, once-removed dx. NOS cancer (maybe ovarian) in late 30s-40s  . Cancer Cousin     female paternal 2nd cousin d. of NOS cancer in her 20s-early 67s  . Ovarian cancer Neg Hx   . Colon cancer Neg Hx     Social History   Social History  . Marital status: Married    Spouse name: [name]  . Number of children: 0  . Years of education: 14   Occupational History  . unemployed [employer], [place]   Social History Main Topics  . Smoking status: Never Smoker  . Smokeless tobacco: Never Used  . Alcohol use 0.0 oz/week     Comment: very rarely - maybe 1 glass wine every 3 mos  . Drug use: No  . Sexual activity: Not Currently    Birth control/ protection: Post-menopausal     Comment: 1st intercourse 46 yo-5 partners   Other Topics Concern  . None   Social History Narrative   Lives at home with husband.    Caffeine use:  Tea/soda occass     Vitals:   04/23/16 1344  BP: 120/78  Pulse: 81  Resp: 12   Body mass index is 29.92 kg/m.  O2 sat at RA 98%  Wt Readings from Last 3 Encounters:  04/05/16 161 lb 6.4 oz  (73.2 kg)  03/15/16 161 lb (73 kg)  02/24/16 162 lb 8 oz (73.7 kg)    Physical Exam  Nursing note and vitals reviewed. Constitutional: She is oriented to person, place, and time. She appears well-developed. No distress.  HENT:  Head: Atraumatic.  Right Ear: Hearing, tympanic membrane, external ear and ear canal normal.  Left Ear: Hearing, external ear and ear canal normal. Tympanic membrane is not erythematous and not bulging. A middle ear effusion (mild) is present.  Mouth/Throat: Oropharynx is clear and moist and mucous membranes are normal.  Eyes: Conjunctivae and EOM are normal. Pupils are equal, round, and reactive to light.  Neck: No tracheal deviation present.  Cardiovascular: Normal rate and regular rhythm.   No murmur heard. Pulses:      Dorsalis pedis pulses are 2+ on the right side, and 2+ on the left side.  Respiratory: Effort normal and breath sounds normal. No respiratory distress.  GI: Soft. She exhibits no mass. There is no hepatomegaly. There is no tenderness.  Musculoskeletal: She exhibits no edema or tenderness.  Lymphadenopathy:    She has no cervical adenopathy.  Neurological: She is alert and oriented to person, place, and time. She has normal strength. No cranial nerve deficit. Coordination and gait normal.  Reflex Scores:      Bicep reflexes are 2+ on the right side and 2+ on the left side.      Patellar reflexes are 2+ on the right side and 2+ on the left side. Skin: Skin is warm. No erythema.  Psychiatric: Her mood appears anxious. Cognition and memory are normal.  Well groomed, good eye contact.      ASSESSMENT AND PLAN:   Sadhana was seen today for annual exam.  Diagnoses and all orders for this visit:  Routine general medical examination at a health care facility   We discussed the importance of regular physical activity and healthy diet for prevention of chronic illness and/or complications. Preventive guidelines reviewed. Vaccination  updated. Aspirin for secondary prevention discussed,some side effects discussed. Ca++ and vit D supplementation recommended. Next CPE in 1 year.   Obstructive sleep apnea -     Ambulatory referral to Pulmonology  Encounter for screening for HIV -     HIV antibody  Pancytopenia ([place])  Currently on chemo, stable overall. She will continue following with oncologists.  -     Pneumococcal Polysaccharide 23  Encounter for HCV screening test for low risk patient -     Hepatitis C antibody screen  RLS (restless legs syndrome)  Well controlled. She was instructed to continue Reqip 0.5 mg 3 tabs at bedtime,refills was recently sent (3 month supply). No changes in current management. F/U in 12 months.  Need for Tdap vaccination -     Tdap vaccine greater than or equal to 7yo IM  Proteinuria, unspecified type  Further recommendations will be given according to labs  results.  -     Microalbumin / creatinine urine ratio  Hypothyroidism, unspecified type  Well controlled. No changes in current management. F/U in 12 months.  -     levothyroxine (SYNTHROID, LEVOTHROID) 100 MCG tablet; Take 1 tablet (100 mcg total) by mouth daily before breakfast.  Hyperlipidemia, unspecified hyperlipidemia type  Stable overall,elevated TG and low HDL. Fish oil may help with TG. Low fat diet to continue.  No changes in current management. F/U in 12 months.  -     atorvastatin (LIPITOR) 40 MG tablet;  Take 1 tablet (40 mg total) by mouth daily at 6 PM.    Return in 6 months (on 10/24/2016) for htn.     Estill Llerena G. [name], MD  [facility]. [place] office.

## 2016-04-23 ENCOUNTER — Ambulatory Visit (INDEPENDENT_AMBULATORY_CARE_PROVIDER_SITE_OTHER): Payer: PPO | Admitting: Family Medicine

## 2016-04-23 ENCOUNTER — Encounter: Payer: Self-pay | Admitting: Family Medicine

## 2016-04-23 VITALS — BP 120/78 | HR 81 | Resp 12 | Ht 61.0 in | Wt 158.4 lb

## 2016-04-23 DIAGNOSIS — G4733 Obstructive sleep apnea (adult) (pediatric): Secondary | ICD-10-CM

## 2016-04-23 DIAGNOSIS — E039 Hypothyroidism, unspecified: Secondary | ICD-10-CM

## 2016-04-23 DIAGNOSIS — Z23 Encounter for immunization: Secondary | ICD-10-CM | POA: Diagnosis not present

## 2016-04-23 DIAGNOSIS — Z1159 Encounter for screening for other viral diseases: Secondary | ICD-10-CM | POA: Diagnosis not present

## 2016-04-23 DIAGNOSIS — E785 Hyperlipidemia, unspecified: Secondary | ICD-10-CM

## 2016-04-23 DIAGNOSIS — Z0000 Encounter for general adult medical examination without abnormal findings: Secondary | ICD-10-CM | POA: Diagnosis not present

## 2016-04-23 DIAGNOSIS — D61818 Other pancytopenia: Secondary | ICD-10-CM | POA: Diagnosis not present

## 2016-04-23 DIAGNOSIS — G2581 Restless legs syndrome: Secondary | ICD-10-CM | POA: Diagnosis not present

## 2016-04-23 DIAGNOSIS — R809 Proteinuria, unspecified: Secondary | ICD-10-CM

## 2016-04-23 DIAGNOSIS — Z114 Encounter for screening for human immunodeficiency virus [HIV]: Secondary | ICD-10-CM | POA: Diagnosis not present

## 2016-04-23 LAB — MICROALBUMIN / CREATININE URINE RATIO
CREATININE, U: 176 mg/dL
MICROALB UR: 1.4 mg/dL (ref 0.0–1.9)
Microalb Creat Ratio: 0.8 mg/g (ref 0.0–30.0)

## 2016-04-23 MED ORDER — ATORVASTATIN CALCIUM 40 MG PO TABS: 40.0000 mg | ORAL_TABLET | Freq: Every day | ORAL | 3 refills | Status: DC

## 2016-04-23 MED ORDER — LEVOTHYROXINE SODIUM 100 MCG PO TABS: 100.0000 ug | ORAL_TABLET | Freq: Every day | ORAL | 3 refills | Status: DC

## 2016-04-23 NOTE — Progress Notes (Signed)
Pre visit review using our clinic review tool, if applicable. No additional management support is needed unless otherwise documented below in the visit note. 

## 2016-04-23 NOTE — Patient Instructions (Addendum)
A few things to remember from today's visit:   Pancytopenia ([place]) - Plan: Pneumococcal Polysaccharide 23  Obstructive sleep apnea - Plan: Ambulatory referral to Pulmonology  Encounter for screening for HIV - Plan: HIV antibody  Encounter for HCV screening test for low risk patient - Plan: Hepatitis C antibody screen  Routine general medical examination at a health care facility  RLS (restless legs syndrome)    At least 150 minutes of moderate exercise per week, daily brisk walking for 15-30 min is a good exercise option. Healthy diet low in saturated (animal) fats and sweets and consisting of fresh fruits and vegetables, lean meats such as fish and white chicken and whole grains.   - Vaccines:  Tdap vaccine every 10 years.  Shingles vaccine recommended at age 32, could be given after 59 years of age but not sure about insurance coverage.  Pneumonia vaccines:  Prevnar 13 at 65 and Pneumovax at 69.  Screening recommendations for low/normal risk women:  Screening for diabetes at age 76-45 and every 3 years.  Cervical cancer prevention:  -HPV vaccination between 58-74 years old. -Pap smear starts at 59 years of age and continues periodically until 59 years old in low risk women. Pap smear every 3 years between 85 and 34 years old. Pap smear every 3 years between women 81 and older if pap smear negative and HPV screening negative.   -Breast cancer: Mammogram: There is disagreement between experts about when to start screening in low risk asymptomatic female but recent recommendations are to start screening at 45 and not later than 58 years old , every 1-2 years and after 59 yo q 2 years. Screening is recommended until 59 years old but some women can continue screening depending of healthy issues.   Colon cancer screening: starts at 59 years old until 59 years old.  Cholesterol disorder screening at age 77 and every 3 years.     Please be sure medication list is  accurate. If a new problem present, please set up appointment sooner than planned today.

## 2016-04-24 ENCOUNTER — Encounter: Payer: Self-pay | Admitting: Family Medicine

## 2016-04-24 LAB — HIV ANTIBODY (ROUTINE TESTING W REFLEX): HIV 1&2 Ab, 4th Generation: NONREACTIVE

## 2016-04-24 LAB — HEPATITIS C ANTIBODY: HCV AB: NEGATIVE

## 2016-04-26 ENCOUNTER — Ambulatory Visit: Payer: PPO

## 2016-04-26 ENCOUNTER — Other Ambulatory Visit (HOSPITAL_BASED_OUTPATIENT_CLINIC_OR_DEPARTMENT_OTHER): Payer: PPO

## 2016-04-26 ENCOUNTER — Ambulatory Visit (HOSPITAL_BASED_OUTPATIENT_CLINIC_OR_DEPARTMENT_OTHER): Payer: PPO | Admitting: Oncology

## 2016-04-26 ENCOUNTER — Ambulatory Visit (HOSPITAL_BASED_OUTPATIENT_CLINIC_OR_DEPARTMENT_OTHER): Payer: PPO

## 2016-04-26 VITALS — BP 121/54 | HR 56 | Temp 97.9°F | Resp 16 | Ht 61.0 in | Wt 161.4 lb

## 2016-04-26 DIAGNOSIS — C50412 Malignant neoplasm of upper-outer quadrant of left female breast: Secondary | ICD-10-CM

## 2016-04-26 DIAGNOSIS — Z5111 Encounter for antineoplastic chemotherapy: Secondary | ICD-10-CM

## 2016-04-26 DIAGNOSIS — Z95828 Presence of other vascular implants and grafts: Secondary | ICD-10-CM

## 2016-04-26 DIAGNOSIS — Z170 Estrogen receptor positive status [ER+]: Principal | ICD-10-CM

## 2016-04-26 DIAGNOSIS — C482 Malignant neoplasm of peritoneum, unspecified: Secondary | ICD-10-CM

## 2016-04-26 DIAGNOSIS — C50919 Malignant neoplasm of unspecified site of unspecified female breast: Secondary | ICD-10-CM

## 2016-04-26 LAB — COMPREHENSIVE METABOLIC PANEL
ALBUMIN: 3.4 g/dL — AB (ref 3.5–5.0)
ALK PHOS: 93 U/L (ref 40–150)
ALT: 7 U/L (ref 0–55)
ANION GAP: 7 meq/L (ref 3–11)
AST: 12 U/L (ref 5–34)
BUN: 14.1 mg/dL (ref 7.0–26.0)
CALCIUM: 8.9 mg/dL (ref 8.4–10.4)
CHLORIDE: 107 meq/L (ref 98–109)
CO2: 28 mEq/L (ref 22–29)
Creatinine: 0.6 mg/dL (ref 0.6–1.1)
Glucose: 95 mg/dl (ref 70–140)
POTASSIUM: 4 meq/L (ref 3.5–5.1)
Sodium: 143 mEq/L (ref 136–145)
Total Bilirubin: 0.26 mg/dL (ref 0.20–1.20)
Total Protein: 6.7 g/dL (ref 6.4–8.3)

## 2016-04-26 LAB — CBC WITH DIFFERENTIAL/PLATELET
BASO%: 0 % (ref 0.0–2.0)
BASOS ABS: 0 10*3/uL (ref 0.0–0.1)
EOS ABS: 0 10*3/uL (ref 0.0–0.5)
EOS%: 1.3 % (ref 0.0–7.0)
HEMATOCRIT: 30.4 % — AB (ref 34.8–46.6)
HEMOGLOBIN: 9.3 g/dL — AB (ref 11.6–15.9)
LYMPH#: 1 10*3/uL (ref 0.9–3.3)
LYMPH%: 46.2 % (ref 14.0–49.7)
MCH: 30.7 pg (ref 25.1–34.0)
MCHC: 30.6 g/dL — AB (ref 31.5–36.0)
MCV: 100.3 fL (ref 79.5–101.0)
MONO#: 0.4 10*3/uL (ref 0.1–0.9)
MONO%: 17.8 % — ABNORMAL HIGH (ref 0.0–14.0)
NEUT#: 0.8 10*3/uL — ABNORMAL LOW (ref 1.5–6.5)
NEUT%: 34.7 % — AB (ref 38.4–76.8)
Platelets: 59 10*3/uL — ABNORMAL LOW (ref 145–400)
RBC: 3.03 10*6/uL — ABNORMAL LOW (ref 3.70–5.45)
RDW: 18 % — AB (ref 11.2–14.5)
WBC: 2.3 10*3/uL — ABNORMAL LOW (ref 3.9–10.3)

## 2016-04-26 MED ORDER — DEXAMETHASONE SODIUM PHOSPHATE 10 MG/ML IJ SOLN
4.0000 mg | Freq: Once | INTRAMUSCULAR | Status: AC
  Administered 2016-04-26: 4 mg via INTRAVENOUS

## 2016-04-26 MED ORDER — DEXAMETHASONE SODIUM PHOSPHATE 10 MG/ML IJ SOLN
INTRAMUSCULAR | Status: AC
  Filled 2016-04-26: qty 1

## 2016-04-26 MED ORDER — CARBOPLATIN CHEMO INTRADERMAL TEST DOSE 100MCG/0.02ML
100.0000 ug | Freq: Once | INTRADERMAL | Status: AC
  Administered 2016-04-26: 100 ug via INTRADERMAL
  Filled 2016-04-26: qty 0.02

## 2016-04-26 MED ORDER — PALONOSETRON HCL INJECTION 0.25 MG/5ML
0.2500 mg | Freq: Once | INTRAVENOUS | Status: AC
  Administered 2016-04-26: 0.25 mg via INTRAVENOUS

## 2016-04-26 MED ORDER — SODIUM CHLORIDE 0.9% FLUSH
10.0000 mL | INTRAVENOUS | Status: DC | PRN
  Administered 2016-04-26: 10 mL
  Filled 2016-04-26: qty 10

## 2016-04-26 MED ORDER — PALONOSETRON HCL INJECTION 0.25 MG/5ML
INTRAVENOUS | Status: AC
  Filled 2016-04-26: qty 5

## 2016-04-26 MED ORDER — HEPARIN SOD (PORK) LOCK FLUSH 100 UNIT/ML IV SOLN
500.0000 [IU] | Freq: Once | INTRAVENOUS | Status: AC | PRN
  Administered 2016-04-26: 500 [IU]
  Filled 2016-04-26: qty 5

## 2016-04-26 MED ORDER — SODIUM CHLORIDE 0.9 % IV SOLN
Freq: Once | INTRAVENOUS | Status: AC
  Administered 2016-04-26: 10:00:00 via INTRAVENOUS

## 2016-04-26 MED ORDER — SODIUM CHLORIDE 0.9 % IV SOLN
570.0000 mg | Freq: Once | INTRAVENOUS | Status: AC
  Administered 2016-04-26: 570 mg via INTRAVENOUS
  Filled 2016-04-26: qty 57

## 2016-04-26 MED ORDER — SODIUM CHLORIDE 0.9 % IJ SOLN
10.0000 mL | INTRAMUSCULAR | Status: DC | PRN
  Administered 2016-04-26: 10 mL via INTRAVENOUS
  Filled 2016-04-26: qty 10

## 2016-04-26 NOTE — Progress Notes (Signed)
Hematology and Oncology Follow Up Visit  [name] [id number] 13-Sep-1955 61 y.o. 04/26/2016 9:28 AM    Principle Diagnosis: 61 year old woman diagnosed with:  1. 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 This was diagnosed in July of 2014. 2. 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 The tumor is ER positive PR negative, HER-2 negative.   Prior Therapy:  She is status post paracentesis performed on 09/17/2012 with the cytology confirmed the presence of adenocarcinoma and immunohistochemical stains suggest GYN etiology. She is also status post lumpectomy for breast cancer diagnosed 25 years ago followed by radiation and chemotherapy under the care of Dr. Sonny Dandy. She did not receive any hormonal therapy. Chemotherapy utilizing carboplatin and Taxotere started on 10/01/2012. Avastin was added with cycle 4. This was discontinued in June of 2015. She is S/P Avastin maintenance only between June 2015 till March 2017. Therapy discontinued due to progression of disease. She is status post Doxil salvage chemotherapy therapy discontinued because of hypersensitivity reaction in March 2017.  Current therapy:  Carboplatin and AUC of 5 every 3 weeks cycle 1 started on 07/08/2015. She is here for cycle 14 of therapy.  Interim History: [name] presents today for a followup visit with her husband. Since her last visit, she reports no major changes in her health. She remains reasonably active and attends activities of daily living with a reasonable quality of life. She did receive the last cycle of chemotherapy without any major complications. She did report some mild fatigue and tiredness associated with Neulasta. She denied any abdominal pain or satiety. She denied any epistaxis, hematochezia or melena. She denied any fevers or recurrent infections.   She does not  report any headaches, blurry vision, syncope or seizures. She denied any fever chills. She does report some chest discomfort but no dyspnea on exertion. Has not reported any shortness of breath or cough. She denied any pain with swallowing. She does not report any vomiting, constipation does report some occasional diarrhea. She denied any GYN bleeding. She reports no anxiety or depression. Remainder of the review of systems is unremarkable.  Medications: I have reviewed the patient's current medications.  Current Outpatient Prescriptions  Medication Sig Dispense Refill  . acetaminophen (TYLENOL) 325 MG tablet Take 2 tablets (650 mg total) by mouth every 4 (four) hours as needed for mild pain, fever or headache. 30 tablet 0  . Acetylcarnitine HCl (ACETYL L-CARNITINE PO) Take 2,000 mg by mouth at bedtime.    . ALPRAZolam (XANAX) 0.5 MG tablet Take 0.5 mg by mouth 2 (two) times daily as needed for anxiety.    Marland Kitchen aspirin 81 MG chewable tablet Chew 1 tablet (81 mg total) by mouth daily. 30 tablet 0  . atorvastatin (LIPITOR) 40 MG tablet Take 1 tablet (40 mg total) by mouth daily at 6 PM. 90 tablet 3  . carvedilol (COREG) 3.125 MG tablet Take 1/2 tablet by mouth twice daily with meals. 30 tablet 9  . clopidogrel (PLAVIX) 75 MG tablet Take 1 tablet (75 mg total) by mouth daily with breakfast. 30 tablet 0  . clotrimazole-betamethasone (LOTRISONE) cream Apply 1 application topically 2 (two) times daily. 30 g 1  . divalproex (DEPAKOTE) 250 MG DR tablet Take by mouth. Take 566ms in the morning and 7558m at night    . fluticasone (FLONASE) 50 MCG/ACT nasal spray INHALE 2 SPRAYS INTO EACH NOSTRIL  EVERY NIGHT  11  . gabapentin (NEURONTIN) 100 MG capsule Take 1 capsule (100 mg total) by mouth 2 (two) times daily. 180 capsule 0  . lamoTRIgine (LAMICTAL) 100 MG tablet Take 100 mg by mouth 2 (two) times daily.    Marland Kitchen levothyroxine (SYNTHROID, LEVOTHROID) 100 MCG tablet Take 1 tablet (100 mcg total) by mouth daily  before breakfast. 90 tablet 3  . lidocaine-prilocaine (EMLA) cream Apply topically as needed. Apply to port with every chemotherapy. 30 g 0  . loperamide (IMODIUM A-D) 2 MG tablet Take 2 mg by mouth as needed for diarrhea or loose stools.    Marland Kitchen loratadine (CLARITIN) 10 MG tablet Take 10 mg by mouth daily as needed for allergies (with chemo).    . ondansetron (ZOFRAN) 8 MG tablet TAKE 1 TABLET BY MOUTH EVERY 8 HOURS AS NEEDED FOR NAUSEA AND VOMITING 30 tablet 2  . pantoprazole (PROTONIX) 40 MG tablet Take 1 tablet (40 mg total) by mouth daily. 30 tablet 11  . prochlorperazine (COMPAZINE) 10 MG tablet Take 1 tablet (10 mg total) by mouth every 6 (six) hours as needed for nausea or vomiting. 30 tablet 0  . rOPINIRole (REQUIP) 0.5 MG tablet Take 3 tablets (1.5 mg total) by mouth at bedtime. 135 tablet 1  . sertraline (ZOLOFT) 50 MG tablet     . nitroGLYCERIN (NITROSTAT) 0.4 MG SL tablet Place 1 tablet (0.4 mg total) under the tongue every 5 (five) minutes as needed for chest pain. (Patient not taking: Reported on 04/26/2016) 25 tablet 3   No current facility-administered medications for this visit.    Facility-Administered Medications Ordered in Other Visits  Medication Dose Route Frequency Provider Last Rate Last Dose  . heparin lock flush 100 unit/mL  500 Units Intravenous Once Wyatt Portela, MD      . sodium chloride flush (NS) 0.9 % injection 10 mL  10 mL Intravenous PRN Wyatt Portela, MD         Allergies:  Allergies  Allergen Reactions  . Doxil [Doxorubicin Hcl Liposomal] Anaphylaxis    1st Doxil.   . Pollen Extract Other (See Comments)    Pollen and grass causes a lot sneezing    Past Medical History, Surgical history, Social history, and Family History were reviewed and updated.   Physical Exam: Blood pressure (!) 121/54, pulse (!) 56, temperature 97.9 F (36.6 C), temperature source Oral, resp. rate 16, height _0  (1.549 m), weight 161 lb 6.4 oz (73.2 kg), SpO2 100 %.  ECOG:  1 General appearance: Well appearing woman appeared without distress. Head: Normocephalic, without obvious abnormality. No oral ulcers or lesions. Neck: no adenopathy Lymph nodes: Cervical, supraclavicular, and axillary nodes normal. Heart:regular rate and rhythm, S1, S2 normal, no murmur, click, rub or gallop Lung:chest clear, no wheezing, rales, normal symmetric air entry Chest wall examination: Well-healed scar in the anterior mid chest wall. Port-A-Cath site without any erythema or induration. Abdomin: Soft, nontender with good bowel sounds. No shifting dullness or ascites. EXT:no erythema, induration, or nodules. No edema. Skin: No rashes or lesions. No ecchymosis.  Lab Results: Lab Results  Component Value Date   WBC 2.3 (L) 04/26/2016   HGB 9.3 (L) 04/26/2016   HCT 30.4 (L) 04/26/2016   MCV 100.3 04/26/2016   PLT 59 (L) 04/26/2016     Chemistry      Component Value Date/Time   NA 142 04/16/2016 0806   NA 141 04/05/2016 1101   K 4.1 04/16/2016 0806  K 4.4 04/05/2016 1101   CL 104 04/16/2016 0806   CO2 30 04/16/2016 0806   CO2 26 04/05/2016 1101   BUN 8 04/16/2016 0806   BUN 10.4 04/05/2016 1101   CREATININE 0.58 04/16/2016 0806   CREATININE 0.6 04/05/2016 1101      Component Value Date/Time   CALCIUM 9.0 04/16/2016 0806   CALCIUM 9.0 04/05/2016 1101   ALKPHOS 112 04/16/2016 0806   ALKPHOS 91 04/05/2016 1101   AST 13 04/16/2016 0806   AST 13 04/05/2016 1101   ALT 6 04/16/2016 0806   ALT 8 04/05/2016 1101   BILITOT 0.3 04/16/2016 0806   BILITOT 0.38 04/05/2016 1101        Impression and Plan:  61 year old woman with the following issues:  1. Peritoneal carcinomatosis with omental involvement. The pathology confirmed the presence of adenocarcinoma of likely GYN etiology. Her CA 125 was elevated at 333.5 on 09/19/2012. She is status post systemic chemotherapy with an excellent response utilizing carboplatin and Taxotere and a Avastin . This therapy was  held in June of 2015 and currently on Avastin maintenance.   CT scan obtained on 05/05/2015 as well as her tumor markers showed clear progression. Her CA-125 is up to 189 and her CT scan showed increase in her peritoneal disease.   She is currently receiving salvage therapy with carboplatin single agent at AUC of 5 With excellent response.  CT scan obtained on 02/17/2016 continues to show positive response to therapy. Her omental metastasis is decreasing slightly compared to previous examination.  Her ANC is slightly low today but she receives Neulasta. Risks and benefits of resuming chemotherapy today was reviewed and she is agreeable to continue. The plan is to continue the same dose and schedule of this treatment and repeat imaging studies in April 2018.  2. IV access: Port-A-Cath is used for chemotherapy without complications.  3. Thrombocytopenia: Her platelets remain stable without any active bleeding. No need for transfusions.  4. Abdominal distention: No ascites noted on ultrasound unlikely related to peritoneal carcinomatosis. No changes noted since her last visit.  5. Left breast mass: She status post biopsy with the pathology indicating T2 N1 disease with the tumor have ER positive, PR negative HER-2 negative. Her Ki-67 is 50%.  Mammogram obtained on 03/20/2016 was reviewed and showed excellent response to systemic chemotherapy. No primary surgical therapy is indicated at this time. The plan is to continue to monitor this malignancy in lieu of her ovarian malignancy.  8. Neutropenia: Neulasta will be given after a cycles of therapy. She is to report any signs or symptoms of infection including fevers or chills or sweats.  9. Chest wall cellulitis: This is resolved at this time. No evidence of recurrent infections.  10. Nausea: Prescription for Compazine will be made available to patient in addition to Zofran.   11. Hypertension: Blood pressure is adequately controlled at this  time.  12. The followup: She will in 3 weeks for the next cycle of therapy.    [facility], MD 3/8/20189:28 AM

## 2016-04-26 NOTE — Progress Notes (Signed)
Dr. Alen Blew okay to tx pt with carboplatin today despite ANC 0.8 and Plt 59. Pt is currently asymptomatic, but plan is to take a break in the near future. Neutropenic precautions and bleeding precautions discussed with pt.

## 2016-04-26 NOTE — Patient Instructions (Addendum)
[facility] Discharge Instructions for Patients Receiving Chemotherapy  Today you received the following chemotherapy agents Carboplatin   To help prevent nausea and vomiting after your treatment, we encourage you to take your nausea medication as   If you develop nausea and vomiting that is not controlled by your nausea medication, call the clinic.   BELOW ARE SYMPTOMS THAT SHOULD BE REPORTED IMMEDIATELY:  *FEVER GREATER THAN 100.5 F  *CHILLS WITH OR WITHOUT FEVER  NAUSEA AND VOMITING THAT IS NOT CONTROLLED WITH YOUR NAUSEA MEDICATION  *UNUSUAL SHORTNESS OF BREATH  *UNUSUAL BRUISING OR BLEEDING  TENDERNESS IN MOUTH AND THROAT WITH OR WITHOUT PRESENCE OF ULCERS  *URINARY PROBLEMS  *BOWEL PROBLEMS  UNUSUAL RASH Items with * indicate a potential emergency and should be followed up as soon as possible.  Feel free to call the clinic you have any questions or concerns. The clinic phone number is [phone].  Please show the [facility] at check-in to the Emergency Department and triage nurse.   Neutropenia Neutropenia is a condition that occurs when you have a lower-than-normal level of a type of white blood cell (neutrophil) in your body. Neutrophils are made in the spongy center of large bones (bone marrow) and they fight infections. Neutrophils are your body's main defense against bacterial and fungal infections. The fewer neutrophils you have and the longer your body remains without them, the greater your risk of getting a severe infection. What are the causes? This condition can occur if your body uses up or destroys neutrophils faster than your bone marrow can make them. This problem may happen because of:  Bacterial or fungal infection.  Allergic disorders.  Reactions to some medicines.  Autoimmune disease.  An enlarged spleen. This condition can also occur if your bone marrow does not produce enough neutrophils. This problem may be caused  by:  Cancer.  Cancer treatments, such as radiation or chemotherapy.  Viral infections.  Medicines, such as phenytoin.  Vitamin B12 deficiency.  Diseases of the bone marrow.  Environmental toxins, such as insecticides. What are the signs or symptoms? This condition does not usually cause symptoms. If symptoms are present, they are usually caused by an underlying infection. Symptoms of an infection may include:  Fever.  Chills.  Swollen glands.  Oral or anal ulcers.  Cough and shortness of breath.  Rash.  Skin infection.  Fatigue. How is this diagnosed? Your health care provider may suspect neutropenia if you have:  A condition that may cause neutropenia.  Symptoms of infection, especially fever.  Frequent and unusual infections. You will have a medical history and physical exam. Tests will also be done, such as:  A complete blood count (CBC).  A procedure to collect a sample of bone marrow for examination (bone marrow biopsy).  A chest X-ray.  A urine culture.  A blood culture. How is this treated? Treatment depends on the underlying cause and severity of your condition. Mild neutropenia may not require treatment. Treatment may include medicines, such as:  Antibiotic medicine given through an IV tube.  Antiviral medicines.  Antifungal medicines.  A medicine to increase neutrophil production (colony-stimulating factor). You may get this drug through an IV tube or by injection.  Steroids given through an IV tube. If an underlying condition is causing neutropenia, you may need treatment for that condition. If medicines you are taking are causing neutropenia, your health care provider may have you stop taking those medicines. Follow these instructions at home: Medicines  Take over-the-counter and prescription medicines only as told by your health care provider.  Get a seasonal flu shot (influenza vaccine). Lifestyle   Do not eat unpasteurized  foods.Do not eat unwashed raw fruits or vegetables.  Avoid exposure to groups of people or children.  Avoid being around people who are sick.  Avoid being around dirt or dust, such as in construction areas or gardens.  Do not provide direct care for pets. Avoid animal droppings. Do not clean litter boxes and bird cages. Hygiene    Bathe daily.  Clean the area between the genitals and the anus (perineal area) after you urinate or have a bowel movement. If you are female, wipe from front to back.  Brush your teeth with a soft toothbrush before and after meals.  Do not use a razor that has a blade. Use an electric razor to remove hair.  Wash your hands often. Make sure others who come in contact with you also wash their hands. If soap and water are not available, use hand sanitizer. General instructions   Do not have sex unless your health care provider has approved.  Take actions to avoid cuts and burns. For example:  Be cautious when you use knives. Always cut away from yourself.  Keep knives in protective sheaths or guards when not in use.  Use oven mitts when you cook with a hot stove, oven, or grill.  Stand a safe distance away from open fires.  Avoid people who received a vaccine in the past 30 days if that vaccine contained a live version of the germ (live vaccine). You should not get a live vaccine. Common live vaccines are varicella, measles, mumps, and rubella.  Do not share food utensils.  Do not use tampons, enemas, or rectal suppositories unless your health care provider has approved.  Keep all appointments as told by your health care provider. This is important. Contact a health care provider if:  You have a fever.  You have chills or you start to shake.  You have:  A sore throat.  A warm, red, or tender area on your skin.  A cough.  Frequent or painful urination.  Vaginal discharge or itching.  You develop:  Sores in your mouth or  anus.  Swollen lymph nodes.  Red streaks on the skin.  A rash.  You feel:  Nauseous or you vomit.  Very fatigued.  Short of breath. This information is not intended to replace advice given to you by your health care provider. Make sure you discuss any questions you have with your health care provider. Document Released: 07/28/2001 Document Revised: 07/14/2015 Document Reviewed: 08/18/2014 Elsevier Interactive Patient Education  2017 [employer].

## 2016-04-27 ENCOUNTER — Ambulatory Visit (HOSPITAL_BASED_OUTPATIENT_CLINIC_OR_DEPARTMENT_OTHER): Payer: PPO

## 2016-04-27 ENCOUNTER — Telehealth: Payer: Self-pay | Admitting: Oncology

## 2016-04-27 VITALS — BP 120/72 | HR 68 | Temp 98.0°F | Resp 18

## 2016-04-27 DIAGNOSIS — Z5189 Encounter for other specified aftercare: Secondary | ICD-10-CM | POA: Diagnosis not present

## 2016-04-27 DIAGNOSIS — C482 Malignant neoplasm of peritoneum, unspecified: Secondary | ICD-10-CM | POA: Diagnosis not present

## 2016-04-27 LAB — CA 125: CANCER ANTIGEN (CA) 125: 23.6 U/mL (ref 0.0–38.1)

## 2016-04-27 MED ORDER — PEGFILGRASTIM INJECTION 6 MG/0.6ML ~~LOC~~
6.0000 mg | PREFILLED_SYRINGE | Freq: Once | SUBCUTANEOUS | Status: AC
  Administered 2016-04-27: 6 mg via SUBCUTANEOUS
  Filled 2016-04-27: qty 0.6

## 2016-04-27 NOTE — Telephone Encounter (Signed)
Labs,flush, Chemo and Injection was scheduled per 04/26/16 los. Patient was given a copy of the AVS report and appointment schedule, per 04/26/16 los.

## 2016-04-29 ENCOUNTER — Encounter: Payer: Self-pay | Admitting: Family Medicine

## 2016-05-02 ENCOUNTER — Encounter: Payer: Self-pay | Admitting: Pulmonary Disease

## 2016-05-04 ENCOUNTER — Other Ambulatory Visit: Payer: Self-pay | Admitting: Oncology

## 2016-05-07 ENCOUNTER — Encounter: Payer: Self-pay | Admitting: Oncology

## 2016-05-16 ENCOUNTER — Ambulatory Visit (HOSPITAL_BASED_OUTPATIENT_CLINIC_OR_DEPARTMENT_OTHER): Payer: PPO

## 2016-05-16 ENCOUNTER — Ambulatory Visit: Payer: PPO

## 2016-05-16 ENCOUNTER — Telehealth: Payer: Self-pay | Admitting: Oncology

## 2016-05-16 ENCOUNTER — Ambulatory Visit (HOSPITAL_BASED_OUTPATIENT_CLINIC_OR_DEPARTMENT_OTHER): Payer: PPO | Admitting: Oncology

## 2016-05-16 ENCOUNTER — Other Ambulatory Visit (HOSPITAL_BASED_OUTPATIENT_CLINIC_OR_DEPARTMENT_OTHER): Payer: PPO

## 2016-05-16 VITALS — BP 113/58 | HR 58 | Temp 97.0°F | Resp 18 | Ht 61.0 in | Wt 159.1 lb

## 2016-05-16 DIAGNOSIS — C482 Malignant neoplasm of peritoneum, unspecified: Secondary | ICD-10-CM

## 2016-05-16 DIAGNOSIS — C50912 Malignant neoplasm of unspecified site of left female breast: Secondary | ICD-10-CM | POA: Diagnosis not present

## 2016-05-16 DIAGNOSIS — Z170 Estrogen receptor positive status [ER+]: Principal | ICD-10-CM

## 2016-05-16 DIAGNOSIS — Z5111 Encounter for antineoplastic chemotherapy: Secondary | ICD-10-CM

## 2016-05-16 DIAGNOSIS — D696 Thrombocytopenia, unspecified: Secondary | ICD-10-CM

## 2016-05-16 DIAGNOSIS — C50412 Malignant neoplasm of upper-outer quadrant of left female breast: Secondary | ICD-10-CM | POA: Diagnosis not present

## 2016-05-16 LAB — COMPREHENSIVE METABOLIC PANEL
ALBUMIN: 3.6 g/dL (ref 3.5–5.0)
ALK PHOS: 96 U/L (ref 40–150)
ALT: 9 U/L (ref 0–55)
AST: 13 U/L (ref 5–34)
Anion Gap: 7 mEq/L (ref 3–11)
BUN: 14.5 mg/dL (ref 7.0–26.0)
CO2: 28 mEq/L (ref 22–29)
Calcium: 9 mg/dL (ref 8.4–10.4)
Chloride: 106 mEq/L (ref 98–109)
Creatinine: 0.7 mg/dL (ref 0.6–1.1)
EGFR: >90 mL/min/{1.73_m2} (ref >90)
GLUCOSE: 89 mg/dL (ref 70–140)
POTASSIUM: 4.3 meq/L (ref 3.5–5.1)
SODIUM: 141 meq/L (ref 136–145)
TOTAL PROTEIN: 7.1 g/dL (ref 6.4–8.3)
Total Bilirubin: 0.34 mg/dL (ref 0.20–1.20)

## 2016-05-16 LAB — CBC WITH DIFFERENTIAL/PLATELET
BASO%: 0.5 % (ref 0.0–2.0)
Basophils Absolute: 0 10*3/uL (ref 0.0–0.1)
EOS ABS: 0 10*3/uL (ref 0.0–0.5)
EOS%: 0.9 % (ref 0.0–7.0)
HCT: 31.3 % — ABNORMAL LOW (ref 34.8–46.6)
HEMOGLOBIN: 10 g/dL — AB (ref 11.6–15.9)
LYMPH#: 1.2 10*3/uL (ref 0.9–3.3)
LYMPH%: 43.3 % (ref 14.0–49.7)
MCH: 31.6 pg (ref 25.1–34.0)
MCHC: 31.9 g/dL (ref 31.5–36.0)
MCV: 98.9 fL (ref 79.5–101.0)
MONO#: 0.3 10*3/uL (ref 0.1–0.9)
MONO%: 12.3 % (ref 0.0–14.0)
NEUT%: 43 % (ref 38.4–76.8)
NEUTROS ABS: 1.2 10*3/uL — AB (ref 1.5–6.5)
Platelets: 75 10*3/uL — ABNORMAL LOW (ref 145–400)
RBC: 3.16 10*6/uL — ABNORMAL LOW (ref 3.70–5.45)
RDW: 17.8 % — AB (ref 11.2–14.5)
WBC: 2.8 10*3/uL — ABNORMAL LOW (ref 3.9–10.3)

## 2016-05-16 MED ORDER — PALONOSETRON HCL INJECTION 0.25 MG/5ML
INTRAVENOUS | Status: AC
Start: 2016-05-16 — End: 2016-05-16
  Filled 2016-05-16: qty 5

## 2016-05-16 MED ORDER — HEPARIN SOD (PORK) LOCK FLUSH 100 UNIT/ML IV SOLN
500.0000 [IU] | Freq: Once | INTRAVENOUS | Status: AC | PRN
  Administered 2016-05-16: 500 [IU]
  Filled 2016-05-16: qty 5

## 2016-05-16 MED ORDER — SODIUM CHLORIDE 0.9 % IV SOLN
Freq: Once | INTRAVENOUS | Status: AC
  Administered 2016-05-16: 12:00:00 via INTRAVENOUS

## 2016-05-16 MED ORDER — SODIUM CHLORIDE 0.9% FLUSH
10.0000 mL | INTRAVENOUS | Status: DC | PRN
  Administered 2016-05-16: 10 mL
  Filled 2016-05-16: qty 10

## 2016-05-16 MED ORDER — CARBOPLATIN CHEMO INTRADERMAL TEST DOSE 100MCG/0.02ML
100.0000 ug | Freq: Once | INTRADERMAL | Status: AC
  Administered 2016-05-16: 100 ug via INTRADERMAL
  Filled 2016-05-16: qty 0.02

## 2016-05-16 MED ORDER — SODIUM CHLORIDE 0.9% FLUSH
10.0000 mL | INTRAVENOUS | Status: DC | PRN
  Administered 2016-05-16: 10 mL via INTRAVENOUS
  Filled 2016-05-16: qty 10

## 2016-05-16 MED ORDER — DEXAMETHASONE SODIUM PHOSPHATE 10 MG/ML IJ SOLN
4.0000 mg | Freq: Once | INTRAMUSCULAR | Status: AC
  Administered 2016-05-16: 4 mg via INTRAVENOUS

## 2016-05-16 MED ORDER — HEPARIN SOD (PORK) LOCK FLUSH 100 UNIT/ML IV SOLN
500.0000 [IU] | Freq: Once | INTRAVENOUS | Status: DC
  Filled 2016-05-16: qty 5

## 2016-05-16 MED ORDER — PALONOSETRON HCL INJECTION 0.25 MG/5ML
0.2500 mg | Freq: Once | INTRAVENOUS | Status: AC
Start: 2016-05-16 — End: 2016-05-16
  Administered 2016-05-16: 0.25 mg via INTRAVENOUS

## 2016-05-16 MED ORDER — CARBOPLATIN CHEMO INJECTION 600 MG/60ML
570.0000 mg | Freq: Once | INTRAVENOUS | Status: AC
  Administered 2016-05-16: 570 mg via INTRAVENOUS
  Filled 2016-05-16: qty 57

## 2016-05-16 MED ORDER — DEXAMETHASONE SODIUM PHOSPHATE 10 MG/ML IJ SOLN
INTRAMUSCULAR | Status: AC
  Filled 2016-05-16: qty 1

## 2016-05-16 NOTE — Telephone Encounter (Signed)
Added appointments for 5/9 and 5/10 per 3/28 los. Per patient request injection scheduled for 5/10 late PM vs 5/11. Other appointments remain the same. Patient declined print out due to she uses my chart.

## 2016-05-16 NOTE — Progress Notes (Signed)
Ok to treat with ANC and platelet count today per MD [facility]

## 2016-05-16 NOTE — Patient Instructions (Signed)
[facility] Discharge Instructions for Patients Receiving Chemotherapy  Today you received the following chemotherapy agents: Carboplatin   To help prevent nausea and vomiting after your treatment, we encourage you to take your nausea medication as directed.    If you develop nausea and vomiting that is not controlled by your nausea medication, call the clinic.   BELOW ARE SYMPTOMS THAT SHOULD BE REPORTED IMMEDIATELY:  *FEVER GREATER THAN 100.5 F  *CHILLS WITH OR WITHOUT FEVER  NAUSEA AND VOMITING THAT IS NOT CONTROLLED WITH YOUR NAUSEA MEDICATION  *UNUSUAL SHORTNESS OF BREATH  *UNUSUAL BRUISING OR BLEEDING  TENDERNESS IN MOUTH AND THROAT WITH OR WITHOUT PRESENCE OF ULCERS  *URINARY PROBLEMS  *BOWEL PROBLEMS  UNUSUAL RASH Items with * indicate a potential emergency and should be followed up as soon as possible.  Feel free to call the clinic you have any questions or concerns. The clinic phone number is [phone].  Please show the CHEMO ALERT CARD at check-in to the Emergency Department and triage nurse.   

## 2016-05-16 NOTE — Progress Notes (Signed)
Hematology and Oncology Follow Up Visit  [name] [id number] [birth year] 61 y.o. 05/16/2016 10:12 AM    Principle Diagnosis: 61 year old woman diagnosed with:  1. 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 This was diagnosed in July of 2014. 2. 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 The tumor is ER positive PR negative, HER-2 negative.   Prior Therapy:  She is status post paracentesis performed on 09/17/2012 with the cytology confirmed the presence of adenocarcinoma and immunohistochemical stains suggest GYN etiology. She is also status post lumpectomy for breast cancer diagnosed 25 years ago followed by radiation and chemotherapy under the care of Dr. Sonny Dandy. She did not receive any hormonal therapy. Chemotherapy utilizing carboplatin and Taxotere started on 10/01/2012. Avastin was added with cycle 4. This was discontinued in June of 2015. She is S/P Avastin maintenance only between June 2015 till March 2017. Therapy discontinued due to progression of disease. She is status post Doxil salvage chemotherapy therapy discontinued because of hypersensitivity reaction in March 2017.  Current therapy:  Carboplatin and AUC of 5 every 3 weeks cycle 1 started on 07/08/2015. She is here for cycle 15 of therapy.  Interim History: [name] presents today for a followup visit with her husband. Since her last visit, she reports reasonably well without any recent complaints.  She did receive the last cycle of chemotherapy without any major complications. She did report some mild fatigue and tiredness associated with Neulasta. She denied any abdominal pain or satiety. She denied any epistaxis, hematochezia or melena. She denied any fevers or recurrent infections. Her nausea have subsided at this time and seems to be manageable with her current antiemetic  regimen.  She denied any breast masses or lesions. She denied any nipple discharge or lymphadenopathy. Her quality of life remained reasonably maintained on this chemotherapy. She was started on Wellbutrin for her depression and bipolar disorder but she could not tolerate it and that was discontinued. She continued to follow with psychiatry regarding this issue.  She does not report any headaches, blurry vision, syncope or seizures. She denied any fever chills. She does report some chest discomfort but no dyspnea on exertion. Has not reported any shortness of breath or cough. She denied any pain with swallowing. She does not report any vomiting, constipation does report some occasional diarrhea. She denied any GYN bleeding. She reports no anxiety or depression. Remainder of the review of systems is unremarkable.  Medications: I have reviewed the patient's current medications.  Current Outpatient Prescriptions  Medication Sig Dispense Refill  . acetaminophen (TYLENOL) 325 MG tablet Take 2 tablets (650 mg total) by mouth every 4 (four) hours as needed for mild pain, fever or headache. 30 tablet 0  . Acetylcarnitine HCl (ACETYL L-CARNITINE PO) Take 2,000 mg by mouth at bedtime.    . ALPRAZolam (XANAX) 0.5 MG tablet Take 0.5 mg by mouth 2 (two) times daily as needed for anxiety.    Marland Kitchen aspirin 81 MG chewable tablet Chew 1 tablet (81 mg total) by mouth daily. 30 tablet 0  . atorvastatin (LIPITOR) 40 MG tablet Take 1 tablet (40 mg total) by mouth daily at 6 PM. 90 tablet 3  . carvedilol (COREG) 3.125 MG tablet Take 1/2 tablet by mouth twice daily with meals. 30 tablet 9  . clopidogrel (PLAVIX) 75 MG tablet Take 1 tablet (75 mg total) by mouth daily with  breakfast. 30 tablet 0  . clotrimazole-betamethasone (LOTRISONE) cream Apply 1 application topically 2 (two) times daily. 30 g 1  . divalproex (DEPAKOTE) 250 MG DR tablet Take by mouth. Take 579ms in the morning and 7555m at night    . fluticasone  (FLONASE) 50 MCG/ACT nasal spray INHALE 2 SPRAYS INTO EACH NOSTRIL EVERY NIGHT  11  . gabapentin (NEURONTIN) 100 MG capsule Take 1 capsule by mouth twice daily. 180 capsule 3  . lamoTRIgine (LAMICTAL) 100 MG tablet Take 100 mg by mouth 2 (two) times daily.    . Marland Kitchenevothyroxine (SYNTHROID, LEVOTHROID) 100 MCG tablet Take 1 tablet (100 mcg total) by mouth daily before breakfast. 90 tablet 3  . lidocaine-prilocaine (EMLA) cream Apply topically as needed. Apply to port with every chemotherapy. 30 g 0  . loperamide (IMODIUM A-D) 2 MG tablet Take 2 mg by mouth as needed for diarrhea or loose stools.    . Marland Kitchenoratadine (CLARITIN) 10 MG tablet Take 10 mg by mouth daily as needed for allergies (with chemo).    . nitroGLYCERIN (NITROSTAT) 0.4 MG SL tablet Place 1 tablet (0.4 mg total) under the tongue every 5 (five) minutes as needed for chest pain. 25 tablet 3  . ondansetron (ZOFRAN) 8 MG tablet TAKE 1 TABLET BY MOUTH EVERY 8 HOURS AS NEEDED FOR NAUSEA AND VOMITING 30 tablet 2  . pantoprazole (PROTONIX) 40 MG tablet Take 1 tablet (40 mg total) by mouth daily. 30 tablet 11  . prochlorperazine (COMPAZINE) 10 MG tablet Take 1 tablet (10 mg total) by mouth every 6 (six) hours as needed for nausea or vomiting. 30 tablet 0  . rOPINIRole (REQUIP) 0.5 MG tablet Take 3 tablets (1.5 mg total) by mouth at bedtime. 135 tablet 1  . sertraline (ZOLOFT) 50 MG tablet      No current facility-administered medications for this visit.    Facility-Administered Medications Ordered in Other Visits  Medication Dose Route Frequency Provider Last Rate Last Dose  . heparin lock flush 100 unit/mL  500 Units Intravenous Once FiWyatt PortelaMD      . sodium chloride flush (NS) 0.9 % injection 10 mL  10 mL Intravenous PRN FiWyatt PortelaMD         Allergies:  Allergies  Allergen Reactions  . Doxil [Doxorubicin Hcl Liposomal] Anaphylaxis    1st Doxil.   . Pollen Extract Other (See Comments)    Pollen and grass causes a lot  sneezing    Past Medical History, Surgical history, Social history, and Family History were reviewed and updated.   Physical Exam: Blood pressure (!) 113/58, pulse (!) 58, temperature 97 F (36.1 C), temperature source Oral, resp. rate 18, height _0  (1.549 m), weight 159 lb 1.6 oz (72.2 kg), SpO2 100 %.  ECOG: 1 General appearance: Alert, awake woman with appropriate mood. Head: Normocephalic, without obvious abnormality. No oral thrush noted. Neck: no adenopathy Lymph nodes: Cervical, supraclavicular, and axillary nodes normal. Heart:regular rate and rhythm, S1, S2 normal, no murmur, click, rub or gallop Lung:chest clear, no wheezing, rales, normal symmetric air entry Chest wall examination:  Port-A-Cath site without any erythema or induration. Abdomin: Soft, nontender with good bowel sounds. No shifting dullness or ascites. EXT:no erythema, induration, or nodules. No edema. Skin: No rashes or lesions. No ecchymosis.  Lab Results: Lab Results  Component Value Date   WBC 2.8 (L) 05/16/2016   HGB 10.0 (L) 05/16/2016   HCT 31.3 (L) 05/16/2016   MCV 98.9 05/16/2016  PLT 75 (L) 05/16/2016     Chemistry      Component Value Date/Time   NA 143 04/26/2016 0853   K 4.0 04/26/2016 0853   CL 104 04/16/2016 0806   CO2 28 04/26/2016 0853   BUN 14.1 04/26/2016 0853   CREATININE 0.6 04/26/2016 0853      Component Value Date/Time   CALCIUM 8.9 04/26/2016 0853   ALKPHOS 93 04/26/2016 0853   AST 12 04/26/2016 0853   ALT 7 04/26/2016 0853   BILITOT 0.26 04/26/2016 0853     Results for LAURELAI, LEPP (MRN [id number]) as of 05/16/2016 10:14  Ref. Range 03/15/2016 09:30 04/05/2016 11:01 04/26/2016 08:53  Cancer Antigen (CA) 125 Latest Ref Range: 0.0 - 38.1 U/mL 29.1 25.0 23.6     Impression and Plan:  61 year old woman with the following issues:  1. Peritoneal carcinomatosis with omental involvement. The pathology confirmed the presence of adenocarcinoma of likely GYN  etiology. Her CA 125 was elevated at 333.5 on 09/19/2012. She is status post systemic chemotherapy with an excellent response utilizing carboplatin and Taxotere and a Avastin . This therapy was held in June of 2015 and currently on Avastin maintenance.   CT scan obtained on 05/05/2015 as well as her tumor markers showed clear progression. Her CA-125 is up to 189 and her CT scan showed increase in her peritoneal disease.   She is currently receiving salvage therapy with carboplatin single agent at AUC of 5 With excellent response.  CT scan obtained on 02/17/2016 continues to show positive response to therapy. Her omental metastasis is decreasing slightly compared to previous examination.  The plan is to continue with the same treatment regimen at this time given her excellent response to therapy and good tolerance. I plan on repeating imaging studies after the next cycle of chemotherapy and possibly proceed with the treatment break after that. She is wanted to proceed with chemotherapy today.  2. IV access: Port-A-Cath is used for chemotherapy without complications.  3. Thrombocytopenia: Her platelets improved since the last visit. No bleeding noted.  4. Abdominal distention: No ascites noted on ultrasound unlikely related to peritoneal carcinomatosis. No changes noted since her last visit.  5. Left breast mass: She status post biopsy with the pathology indicating T2 N1 disease with the tumor have ER positive, PR negative HER-2 negative. Her Ki-67 is 50%.  Mammogram obtained on 03/20/2016 showed excellent response to systemic chemotherapy. No primary surgical therapy is indicated at this time. The plan is to continue to monitor this malignancy in lieu of her ovarian malignancy.  8. Neutropenia: Neulasta will be given after a cycles of therapy. Her absolute neutrophil count is adequate today to proceed.  9. Chest wall cellulitis: This is resolved at this time. No evidence of recurrent  infections.  10. Nausea: Prescription for Compazine will be made available to patient in addition to Zofran.  Issues have resolved at this time.  11. Hypertension: Blood pressure is adequately controlled at this time.  12. The followup: She will in 3 weeks for the next cycle of therapy.    [facility], MD 3/28/201810:12 AM

## 2016-05-17 ENCOUNTER — Ambulatory Visit (HOSPITAL_BASED_OUTPATIENT_CLINIC_OR_DEPARTMENT_OTHER): Payer: PPO

## 2016-05-17 VITALS — BP 129/52 | HR 61 | Temp 98.1°F | Resp 18

## 2016-05-17 DIAGNOSIS — C482 Malignant neoplasm of peritoneum, unspecified: Secondary | ICD-10-CM

## 2016-05-17 DIAGNOSIS — Z5189 Encounter for other specified aftercare: Secondary | ICD-10-CM

## 2016-05-17 LAB — CA 125: Cancer Antigen (CA) 125: 24.7 U/mL (ref 0.0–38.1)

## 2016-05-17 MED ORDER — PEGFILGRASTIM INJECTION 6 MG/0.6ML ~~LOC~~
6.0000 mg | PREFILLED_SYRINGE | Freq: Once | SUBCUTANEOUS | Status: AC
  Administered 2016-05-17: 6 mg via SUBCUTANEOUS
  Filled 2016-05-17: qty 0.6

## 2016-05-17 NOTE — Patient Instructions (Signed)
Pegfilgrastim injection What is this medicine? PEGFILGRASTIM (PEG fil gra stim) is a long-acting granulocyte colony-stimulating factor that stimulates the growth of neutrophils, a type of white blood cell important in the body's fight against infection. It is used to reduce the incidence of fever and infection in patients with certain types of cancer who are receiving chemotherapy that affects the bone marrow, and to increase survival after being exposed to high doses of radiation. This medicine may be used for other purposes; ask your health care provider or pharmacist if you have questions. What should I tell my health care provider before I take this medicine? They need to know if you have any of these conditions: -kidney disease -latex allergy -ongoing radiation therapy -sickle cell disease -skin reactions to acrylic adhesives (On-Body Injector only) -an unusual or allergic reaction to pegfilgrastim, filgrastim, other medicines, foods, dyes, or preservatives -pregnant or trying to get pregnant -breast-feeding How should I use this medicine? This medicine is for injection under the skin. If you get this medicine at home, you will be taught how to prepare and give the pre-filled syringe or how to use the On-body Injector. Refer to the patient Instructions for Use for detailed instructions. Use exactly as directed. Take your medicine at regular intervals. Do not take your medicine more often than directed. It is important that you put your used needles and syringes in a special sharps container. Do not put them in a trash can. If you do not have a sharps container, call your pharmacist or healthcare provider to get one. Talk to your pediatrician regarding the use of this medicine in children. While this drug may be prescribed for selected conditions, precautions do apply. Overdosage: If you think you have taken too much of this medicine contact a poison control center or emergency room at  once. NOTE: This medicine is only for you. Do not share this medicine with others. What if I miss a dose? It is important not to miss your dose. Call your doctor or health care professional if you miss your dose. If you miss a dose due to an On-body Injector failure or leakage, a new dose should be administered as soon as possible using a single prefilled syringe for manual use. What may interact with this medicine? Interactions have not been studied. Give your health care provider a list of all the medicines, herbs, non-prescription drugs, or dietary supplements you use. Also tell them if you smoke, drink alcohol, or use illegal drugs. Some items may interact with your medicine. This list may not describe all possible interactions. Give your health care provider a list of all the medicines, herbs, non-prescription drugs, or dietary supplements you use. Also tell them if you smoke, drink alcohol, or use illegal drugs. Some items may interact with your medicine. What should I watch for while using this medicine? You may need blood work done while you are taking this medicine. If you are going to need a MRI, CT scan, or other procedure, tell your doctor that you are using this medicine (On-Body Injector only). What side effects may I notice from receiving this medicine? Side effects that you should report to your doctor or health care professional as soon as possible: -allergic reactions like skin rash, itching or hives, swelling of the face, lips, or tongue -dizziness -fever -pain, redness, or irritation at site where injected -pinpoint red spots on the skin -red or dark-brown urine -shortness of breath or breathing problems -stomach or side pain, or pain   at the shoulder -swelling -tiredness -trouble passing urine or change in the amount of urine Side effects that usually do not require medical attention (report to your doctor or health care professional if they continue or are  bothersome): -bone pain -muscle pain This list may not describe all possible side effects. Call your doctor for medical advice about side effects. You may report side effects to FDA at 1-800-FDA-1088. Where should I keep my medicine? Keep out of the reach of children. Store pre-filled syringes in a refrigerator between 2 and 8 degrees C (36 and 46 degrees F). Do not freeze. Keep in carton to protect from light. Throw away this medicine if it is left out of the refrigerator for more than 48 hours. Throw away any unused medicine after the expiration date. NOTE: This sheet is a summary. It may not cover all possible information. If you have questions about this medicine, talk to your doctor, pharmacist, or health care provider.    2016, Elsevier/Gold Standard. (2014-02-25 14:30:14)  

## 2016-05-21 DIAGNOSIS — H524 Presbyopia: Secondary | ICD-10-CM | POA: Diagnosis not present

## 2016-05-21 DIAGNOSIS — H35363 Drusen (degenerative) of macula, bilateral: Secondary | ICD-10-CM | POA: Diagnosis not present

## 2016-05-22 ENCOUNTER — Encounter: Payer: Self-pay | Admitting: Oncology

## 2016-05-23 ENCOUNTER — Encounter (HOSPITAL_COMMUNITY): Payer: Self-pay

## 2016-05-24 ENCOUNTER — Encounter: Payer: Self-pay | Admitting: Internal Medicine

## 2016-05-28 ENCOUNTER — Encounter: Payer: Self-pay | Admitting: Oncology

## 2016-05-29 ENCOUNTER — Encounter: Payer: Self-pay | Admitting: *Deleted

## 2016-06-04 ENCOUNTER — Telehealth: Payer: Self-pay | Admitting: Oncology

## 2016-06-04 NOTE — Telephone Encounter (Signed)
Called patient to confirm new injection appointment time. Left message on patient's voicemail regarding updated Injection appointment time.

## 2016-06-04 NOTE — Progress Notes (Signed)
Cardiology Office Note   Date:  06/11/2016   ID:  [name] [birth year], MRN [id number]  PCP:  Betty [name], MD  Cardiologist:   Dorris Carnes, MD    F/U of CAD     History of Present Illness: [name] is a 61 y.o. female with a history of CAD  9/5-9/7 with non-STEMI. LHC demonstrated single vessel CAD with 99% distal LAD stenosis treated with Xience DES. EF was 35-45% at cardiac catheterization. Follow-up echo demonstrated normal LV function with any of 94-85% and mild diastolic dysfunction. She returns for FU.  Patient seen by Kathleen Argue since  Continued to have episoded discomfort  Like admit  Was at rest  NTG relieved  Repeat cath on 9.19 showed paent LAD stent and nonobstructive CAD elsewher  D/C home on 9/19    Plan for DAPT 6 mn to a year    When I saw her in October I inreased imdur to 30  Due to chest pain I saw her earlier this winter  REcomm holding lisinopril due to marginal BP    Since seen she denies dizziness Used NTG once  Breathing is OK   Continues to follow at cancer center    Current Meds  Medication Sig  . acetaminophen (TYLENOL) 325 MG tablet Take 2 tablets (650 mg total) by mouth every 4 (four) hours as needed for mild pain, fever or headache.  . Acetylcarnitine HCl (ACETYL L-CARNITINE PO) Take 2,000 mg by mouth at bedtime.  . ALPRAZolam (XANAX) 0.5 MG tablet Take 0.5 mg by mouth 2 (two) times daily as needed for anxiety.  Marland Kitchen aspirin 81 MG chewable tablet Chew 1 tablet (81 mg total) by mouth daily.  Marland Kitchen atorvastatin (LIPITOR) 40 MG tablet Take 1 tablet (40 mg total) by mouth daily at 6 PM.  . carvedilol (COREG) 3.125 MG tablet Take 1/2 tablet by mouth twice daily with meals.  . clopidogrel (PLAVIX) 75 MG tablet Take 1 tablet (75 mg total) by mouth daily with breakfast.  . clotrimazole-betamethasone (LOTRISONE) cream Apply 1 application topically 2 (two) times daily.  . divalproex (DEPAKOTE) 250 MG DR tablet Take by mouth. Take 500mg s in the  morning and 750mg s at night  . fluticasone (FLONASE) 50 MCG/ACT nasal spray INHALE 2 SPRAYS INTO EACH NOSTRIL EVERY NIGHT  . gabapentin (NEURONTIN) 100 MG capsule Take 1 capsule by mouth twice daily.  Marland Kitchen lamoTRIgine (LAMICTAL) 100 MG tablet Take 100 mg by mouth 2 (two) times daily.  Marland Kitchen levothyroxine (SYNTHROID, LEVOTHROID) 100 MCG tablet Take 1 tablet (100 mcg total) by mouth daily before breakfast.  . lidocaine-prilocaine (EMLA) cream Apply topically as needed. Apply to port with every chemotherapy.  . loperamide (IMODIUM A-D) 2 MG tablet Take 2 mg by mouth as needed for diarrhea or loose stools.  Marland Kitchen loratadine (CLARITIN) 10 MG tablet Take 10 mg by mouth daily as needed for allergies (with chemo).  . nitroGLYCERIN (NITROSTAT) 0.4 MG SL tablet Place 1 tablet (0.4 mg total) under the tongue every 5 (five) minutes as needed for chest pain.  Marland Kitchen ondansetron (ZOFRAN) 8 MG tablet TAKE 1 TABLET BY MOUTH EVERY 8 HOURS AS NEEDED FOR NAUSEA AND VOMITING  . pantoprazole (PROTONIX) 40 MG tablet Take 1 tablet (40 mg total) by mouth daily.  . prochlorperazine (COMPAZINE) 10 MG tablet Take 1 tablet (10 mg total) by mouth every 6 (six) hours as needed for nausea or vomiting.  Marland Kitchen rOPINIRole (REQUIP) 0.5 MG tablet Take 3 tablets (1.5  mg total) by mouth at bedtime.  . sertraline (ZOLOFT) 50 MG tablet      Allergies:   Doxil [doxorubicin hcl liposomal] and Pollen extract   Past Medical History:  Diagnosis Date  . ADD (attention deficit disorder)   . Allergy    seasonal  . Anxiety   . Bipolar disorder ([place])   . Breast cancer ([place]) 1989   right breast  . Cancer ([place])    Omentum  . Coronary artery disease   . Depression   . Hematochezia   . History of echocardiogram    a. Limited Echo 9/17: EF 60-65%, no RWMA, Gr 1 DD, trivial AI, trivial MR, GLS -12% (likely underestimated), no pericardial effusion  . Hyperlipidemia   . Hypothyroidism   . Maintenance chemotherapy    Pt has chemo every 3 weeks (on Friday)    . NSTEMI (non-ST elevated myocardial infarction) ([place]) 10/26/2015  . Osteopenia 03/2009   t score -2.1 FRAX 4.6/0.4  . Premature menopause   . Sleep apnea    mild    Past Surgical History:  Procedure Laterality Date  . BREAST SURGERY  1989   RIGHT LUMPECTOMY, RADIATION AND CHEMO  . CARDIAC CATHETERIZATION N/A 10/26/2015   Procedure: Left Heart Cath and Coronary Angiography;  Surgeon: Wellington Hampshire, MD;  Location: [facility];  Service: Cardiovascular;  Laterality: N/A;  . CARDIAC CATHETERIZATION N/A 10/26/2015   Procedure: Coronary Stent Intervention;  Surgeon: Wellington Hampshire, MD;  Location: [facility];  Service: Cardiovascular;  Laterality: N/A;  . CARDIAC CATHETERIZATION N/A 11/08/2015   Procedure: Left Heart Cath and Coronary Angiography;  Surgeon: Jolaine Artist, MD;  Location: [facility];  Service: Cardiovascular;  Laterality: N/A;  . drug eluting stent  10/26/2015  . FOOT SURGERY  2013   BILATERAL   . HYSTEROSCOPY  2011   [place]  . PELVIC LAPAROSCOPY/ Hysteroscopy  1996     Social History:  The patient  reports that she has never smoked. She has never used smokeless tobacco. She reports that she drinks alcohol. She reports that she does not use drugs.   Family History:  The patient's family history includes Allergies in her father; Breast cancer in her maternal aunt and maternal aunt; Breast cancer (age of onset: 52) in her maternal aunt; Cancer in her cousin and cousin; Diabetes in her maternal grandmother; Heart Problems in her paternal grandfather; Heart disease in her maternal grandfather, maternal grandmother, and mother; Hypertension in her paternal grandfather; Liver cancer (age of onset: 2) in her cousin; Other in her mother.    ROS:  Please see the history of present illness. All other systems are reviewed and  Negative to the above problem except as noted.    PHYSICAL EXAM: VS:  BP 118/68   Pulse 81   Ht 5\' 1"  (1.549 m)   Wt 157 lb  (71.2 kg)   SpO2 94%   BMI 29.66 kg/m   GEN: Well nourished, well developed, in no acute distress  HEENT: normal  Neck: no JVD, carotid bruits, or masses Cardiac: RRR; no murmurs, rubs, or gallops,no edema  Respiratory:  clear to auscultation bilaterally, normal work of breathing GI: soft, nontender, nondistended, + BS  No hepatomegaly  MS: no deformity Moving all extremities   Skin: warm and dry, no rash Neuro:  Strength and sensation are intact Psych: euthymic mood, full affect   EKG:  EKG is  Not ordered today.   Lipid Panel  Component Value Date/Time   CHOL 120 04/16/2016 0806   TRIG 153.0 (H) 04/16/2016 0806   HDL 34.90 (L) 04/16/2016 0806   CHOLHDL 3 04/16/2016 0806   VLDL 30.6 04/16/2016 0806   LDLCALC 54 04/16/2016 0806   LDLDIRECT 151.3 08/24/2009 0803      Wt Readings from Last 3 Encounters:  06/11/16 157 lb (71.2 kg)  06/06/16 158 lb 11.2 oz (72 kg)  05/16/16 159 lb 1.6 oz (72.2 kg)      ASSESSMENT AND PLAN:  1  CAD  PT denies CP  Will review re length of DAPT Rx    2  HL  Continue statin    F/U in clinic in fall     Current medicines are reviewed at length with the patient today.  The patient does not have concerns regarding medicines.  Signed, Dorris Carnes, MD  06/11/2016 2:26 PM    [address], [place], [place]  [address] Phone: [phone]; Fax: [phone]

## 2016-06-06 ENCOUNTER — Ambulatory Visit (HOSPITAL_BASED_OUTPATIENT_CLINIC_OR_DEPARTMENT_OTHER): Payer: PPO

## 2016-06-06 ENCOUNTER — Ambulatory Visit: Payer: PPO

## 2016-06-06 ENCOUNTER — Other Ambulatory Visit (HOSPITAL_BASED_OUTPATIENT_CLINIC_OR_DEPARTMENT_OTHER): Payer: PPO

## 2016-06-06 ENCOUNTER — Encounter: Payer: Self-pay | Admitting: *Deleted

## 2016-06-06 ENCOUNTER — Ambulatory Visit (HOSPITAL_BASED_OUTPATIENT_CLINIC_OR_DEPARTMENT_OTHER): Payer: PPO | Admitting: Oncology

## 2016-06-06 ENCOUNTER — Telehealth: Payer: Self-pay | Admitting: Oncology

## 2016-06-06 VITALS — BP 115/74 | HR 60 | Temp 98.0°F | Resp 18 | Ht 61.0 in | Wt 158.7 lb

## 2016-06-06 DIAGNOSIS — C50412 Malignant neoplasm of upper-outer quadrant of left female breast: Secondary | ICD-10-CM

## 2016-06-06 DIAGNOSIS — R110 Nausea: Secondary | ICD-10-CM | POA: Diagnosis not present

## 2016-06-06 DIAGNOSIS — Z170 Estrogen receptor positive status [ER+]: Principal | ICD-10-CM

## 2016-06-06 DIAGNOSIS — C482 Malignant neoplasm of peritoneum, unspecified: Secondary | ICD-10-CM

## 2016-06-06 DIAGNOSIS — Z5111 Encounter for antineoplastic chemotherapy: Secondary | ICD-10-CM | POA: Diagnosis not present

## 2016-06-06 DIAGNOSIS — D696 Thrombocytopenia, unspecified: Secondary | ICD-10-CM | POA: Diagnosis not present

## 2016-06-06 LAB — CBC WITH DIFFERENTIAL/PLATELET
BASO%: 0 % (ref 0.0–2.0)
Basophils Absolute: 0 10*3/uL (ref 0.0–0.1)
EOS%: 0.8 % (ref 0.0–7.0)
Eosinophils Absolute: 0 10*3/uL (ref 0.0–0.5)
HEMATOCRIT: 32.8 % — AB (ref 34.8–46.6)
HEMOGLOBIN: 10 g/dL — AB (ref 11.6–15.9)
LYMPH#: 1.3 10*3/uL (ref 0.9–3.3)
LYMPH%: 34.8 % (ref 14.0–49.7)
MCH: 31 pg (ref 25.1–34.0)
MCHC: 30.5 g/dL — ABNORMAL LOW (ref 31.5–36.0)
MCV: 101.5 fL — ABNORMAL HIGH (ref 79.5–101.0)
MONO#: 0.4 10*3/uL (ref 0.1–0.9)
MONO%: 12.2 % (ref 0.0–14.0)
NEUT%: 52.2 % (ref 38.4–76.8)
NEUTROS ABS: 1.9 10*3/uL (ref 1.5–6.5)
Platelets: 58 10*3/uL — ABNORMAL LOW (ref 145–400)
RBC: 3.23 10*6/uL — ABNORMAL LOW (ref 3.70–5.45)
RDW: 15.6 % — AB (ref 11.2–14.5)
WBC: 3.6 10*3/uL — ABNORMAL LOW (ref 3.9–10.3)

## 2016-06-06 LAB — COMPREHENSIVE METABOLIC PANEL
ALT: 6 U/L (ref 0–55)
ANION GAP: 9 meq/L (ref 3–11)
AST: 13 U/L (ref 5–34)
Albumin: 3.7 g/dL (ref 3.5–5.0)
Alkaline Phosphatase: 101 U/L (ref 40–150)
BILIRUBIN TOTAL: 0.36 mg/dL (ref 0.20–1.20)
BUN: 16.1 mg/dL (ref 7.0–26.0)
CHLORIDE: 106 meq/L (ref 98–109)
CO2: 26 meq/L (ref 22–29)
Calcium: 9.2 mg/dL (ref 8.4–10.4)
Creatinine: 0.7 mg/dL (ref 0.6–1.1)
Glucose: 102 mg/dl (ref 70–140)
Potassium: 4.2 mEq/L (ref 3.5–5.1)
SODIUM: 141 meq/L (ref 136–145)
TOTAL PROTEIN: 7.2 g/dL (ref 6.4–8.3)

## 2016-06-06 MED ORDER — CARBOPLATIN CHEMO INTRADERMAL TEST DOSE 100MCG/0.02ML
100.0000 ug | Freq: Once | INTRADERMAL | Status: AC
  Administered 2016-06-06: 100 ug via INTRADERMAL
  Filled 2016-06-06: qty 0.02

## 2016-06-06 MED ORDER — PALONOSETRON HCL INJECTION 0.25 MG/5ML
0.2500 mg | Freq: Once | INTRAVENOUS | Status: AC
  Administered 2016-06-06: 0.25 mg via INTRAVENOUS

## 2016-06-06 MED ORDER — PALONOSETRON HCL INJECTION 0.25 MG/5ML
INTRAVENOUS | Status: AC
  Filled 2016-06-06: qty 5

## 2016-06-06 MED ORDER — SODIUM CHLORIDE 0.9 % IV SOLN
Freq: Once | INTRAVENOUS | Status: AC
  Administered 2016-06-06: 11:00:00 via INTRAVENOUS

## 2016-06-06 MED ORDER — HEPARIN SOD (PORK) LOCK FLUSH 100 UNIT/ML IV SOLN
500.0000 [IU] | Freq: Once | INTRAVENOUS | Status: AC | PRN
  Administered 2016-06-06: 500 [IU]
  Filled 2016-06-06: qty 5

## 2016-06-06 MED ORDER — DEXAMETHASONE SODIUM PHOSPHATE 10 MG/ML IJ SOLN
4.0000 mg | Freq: Once | INTRAMUSCULAR | Status: AC
  Administered 2016-06-06: 4 mg via INTRAVENOUS

## 2016-06-06 MED ORDER — DEXAMETHASONE SODIUM PHOSPHATE 10 MG/ML IJ SOLN
INTRAMUSCULAR | Status: AC
  Filled 2016-06-06: qty 1

## 2016-06-06 MED ORDER — CARBOPLATIN CHEMO INJECTION 600 MG/60ML
570.0000 mg | Freq: Once | INTRAVENOUS | Status: AC
  Administered 2016-06-06: 570 mg via INTRAVENOUS
  Filled 2016-06-06: qty 57

## 2016-06-06 MED ORDER — SODIUM CHLORIDE 0.9% FLUSH
10.0000 mL | INTRAVENOUS | Status: DC | PRN
  Administered 2016-06-06: 10 mL
  Filled 2016-06-06: qty 10

## 2016-06-06 NOTE — Progress Notes (Signed)
Hematology and Oncology Follow Up Visit  [name] [id number] Aug 14, 1955 61 y.o. 06/06/2016 9:32 AM    Principle Diagnosis: 61 year old woman diagnosed with:  1. 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 This was diagnosed in July of 2014. 2. 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 The tumor is ER positive PR negative, HER-2 negative.   Prior Therapy:  She is status post paracentesis performed on 09/17/2012 with the cytology confirmed the presence of adenocarcinoma and immunohistochemical stains suggest GYN etiology. She is also status post lumpectomy for breast cancer diagnosed 25 years ago followed by radiation and chemotherapy under the care of Dr. Sonny Dandy. She did not receive any hormonal therapy. Chemotherapy utilizing carboplatin and Taxotere started on 10/01/2012. Avastin was added with cycle 4. This was discontinued in June of 2015. She is S/P Avastin maintenance only between June 2015 till March 2017. Therapy discontinued due to progression of disease. She is status post Doxil salvage chemotherapy therapy discontinued because of hypersensitivity reaction in March 2017.  Current therapy:  Carboplatin and AUC of 5 every 3 weeks cycle 1 started on 07/08/2015. She is here for cycle 16 of therapy.  Interim History: [name] presents today for a followup visit with her husband. Since her last visit, she reports no changes in her health. She denied any complications related to last chemotherapy cycle. She reports mild nausea but no vomiting or abdominal pain. She did report some mild fatigue and tiredness associated with Neulasta. She denied any abdominal pain or satiety. She denied any epistaxis, hematochezia or melena. She denied any fevers or recurrent infections.   She denied any lymphadenopathy or rashes. She denied any breast tenderness or  nipple discharge. She continues to enjoy a reasonable quality of life without any decline in her ability to perform activities of daily living.  She does not report any headaches, blurry vision, syncope or seizures. She denied any fever chills. She does report some chest discomfort but no dyspnea on exertion. Has not reported any shortness of breath or cough. She denied any pain with swallowing. She does not report any vomiting, constipation does report some occasional diarrhea. She denied any GYN bleeding. She reports no anxiety or depression. Remainder of the review of systems is unremarkable.  Medications: I have reviewed the patient's current medications.  Current Outpatient Prescriptions  Medication Sig Dispense Refill  . acetaminophen (TYLENOL) 325 MG tablet Take 2 tablets (650 mg total) by mouth every 4 (four) hours as needed for mild pain, fever or headache. 30 tablet 0  . Acetylcarnitine HCl (ACETYL L-CARNITINE PO) Take 2,000 mg by mouth at bedtime.    . ALPRAZolam (XANAX) 0.5 MG tablet Take 0.5 mg by mouth 2 (two) times daily as needed for anxiety.    Marland Kitchen aspirin 81 MG chewable tablet Chew 1 tablet (81 mg total) by mouth daily. 30 tablet 0  . atorvastatin (LIPITOR) 40 MG tablet Take 1 tablet (40 mg total) by mouth daily at 6 PM. 90 tablet 3  . carvedilol (COREG) 3.125 MG tablet Take 1/2 tablet by mouth twice daily with meals. 30 tablet 9  . clopidogrel (PLAVIX) 75 MG tablet Take 1 tablet (75 mg total) by mouth daily with breakfast. 30 tablet 0  . clotrimazole-betamethasone (LOTRISONE) cream Apply 1 application topically 2 (two) times daily. 30 g 1  . divalproex (DEPAKOTE) 250 MG DR tablet Take by  mouth. Take '500mg'$ s in the morning and '750mg'$ s at night    . fluticasone (FLONASE) 50 MCG/ACT nasal spray INHALE 2 SPRAYS INTO EACH NOSTRIL EVERY NIGHT  11  . gabapentin (NEURONTIN) 100 MG capsule Take 1 capsule by mouth twice daily. 180 capsule 3  . lamoTRIgine (LAMICTAL) 100 MG tablet Take 100 mg  by mouth 2 (two) times daily.    Marland Kitchen levothyroxine (SYNTHROID, LEVOTHROID) 100 MCG tablet Take 1 tablet (100 mcg total) by mouth daily before breakfast. 90 tablet 3  . lidocaine-prilocaine (EMLA) cream Apply topically as needed. Apply to port with every chemotherapy. 30 g 0  . loperamide (IMODIUM A-D) 2 MG tablet Take 2 mg by mouth as needed for diarrhea or loose stools.    Marland Kitchen loratadine (CLARITIN) 10 MG tablet Take 10 mg by mouth daily as needed for allergies (with chemo).    . nitroGLYCERIN (NITROSTAT) 0.4 MG SL tablet Place 1 tablet (0.4 mg total) under the tongue every 5 (five) minutes as needed for chest pain. 25 tablet 3  . ondansetron (ZOFRAN) 8 MG tablet TAKE 1 TABLET BY MOUTH EVERY 8 HOURS AS NEEDED FOR NAUSEA AND VOMITING 30 tablet 2  . pantoprazole (PROTONIX) 40 MG tablet Take 1 tablet (40 mg total) by mouth daily. 30 tablet 11  . prochlorperazine (COMPAZINE) 10 MG tablet Take 1 tablet (10 mg total) by mouth every 6 (six) hours as needed for nausea or vomiting. 30 tablet 0  . rOPINIRole (REQUIP) 0.5 MG tablet Take 3 tablets (1.5 mg total) by mouth at bedtime. 135 tablet 1  . sertraline (ZOLOFT) 50 MG tablet      No current facility-administered medications for this visit.    Facility-Administered Medications Ordered in Other Visits  Medication Dose Route Frequency Provider Last Rate Last Dose  . heparin lock flush 100 unit/mL  500 Units Intravenous Once Wyatt Portela, MD      . sodium chloride flush (NS) 0.9 % injection 10 mL  10 mL Intravenous PRN Wyatt Portela, MD         Allergies:  Allergies  Allergen Reactions  . Doxil [Doxorubicin Hcl Liposomal] Anaphylaxis    1st Doxil.   . Pollen Extract Other (See Comments)    Pollen and grass causes a lot sneezing    Past Medical History, Surgical history, Social history, and Family History were reviewed and updated.   Physical Exam: Vitals eviewed today and showed a blood pressure 115/74, pulse is 68 respiration 18. She is  afebrile and weighs 158.7 pounds ECOG: 1 General appearance: Alert, awake woman without distress. Head: Normocephalic, without obvious abnormality. No oral ulcers or lesions. Neck: no adenopathy or masses. Lymph nodes: Cervical, supraclavicular, and axillary nodes normal. Heart:regular rate and rhythm, S1, S2 normal, no murmur, click, rub or gallop Lung:chest clear, no wheezing, rales, normal symmetric air entry no dullness to percussion. Chest wall examination:  Port-A-Cath site without any erythema or induration. Abdomin: Soft, nontender with good bowel sounds. No rebound or guarding. EXT:no erythema, induration, or nodules. No edema. Skin: No rashes or lesions. No ecchymosis.  Lab Results: Lab Results  Component Value Date   WBC 3.6 (L) 06/06/2016   HGB 10.0 (L) 06/06/2016   HCT 32.8 (L) 06/06/2016   MCV 101.5 (H) 06/06/2016   PLT 58 (L) 06/06/2016     Chemistry      Component Value Date/Time   NA 141 05/16/2016 0929   K 4.3 05/16/2016 0929   CL 104 04/16/2016 0806  CO2 28 05/16/2016 0929   BUN 14.5 05/16/2016 0929   CREATININE 0.7 05/16/2016 0929      Component Value Date/Time   CALCIUM 9.0 05/16/2016 0929   ALKPHOS 96 05/16/2016 0929   AST 13 05/16/2016 0929   ALT 9 05/16/2016 0929   BILITOT 0.34 05/16/2016 0929      Results for AAHANA, ELZA (MRN [id number]) as of 06/06/2016 09:22  Ref. Range 04/05/2016 11:01 04/26/2016 08:53 05/16/2016 09:29  Cancer Antigen (CA) 125 Latest Ref Range: 0.0 - 38.1 U/mL 25.0 23.6 24.7     Impression and Plan:  61 year old woman with the following issues:  1. Peritoneal carcinomatosis with omental involvement. The pathology confirmed the presence of adenocarcinoma of likely GYN etiology. Her CA 125 was elevated at 333.5 on 09/19/2012. She is status post systemic chemotherapy with an excellent response utilizing carboplatin and Taxotere and a Avastin . This therapy was held in June of 2015 and currently on Avastin maintenance.    CT scan obtained on 05/05/2015 as well as her tumor markers showed clear progression. Her CA-125 is up to 189 and her CT scan showed increase in her peritoneal disease.   She is currently receiving salvage therapy with carboplatin single agent at AUC of 5 With excellent response.  CT scan obtained on 02/17/2016 continues to show positive response to therapy. Her omental metastasis is decreasing slightly compared to previous examination.  The plan is to continue with the current dose and schedule and repeat imaging studies in May 2018. Different salvage therapy will be used upon symptomatic progression.  2. IV access: Port-A-Cath remains in place without complications.  3. Thrombocytopenia: Her platelets remains relatively stable without bleeding.  4. Abdominal distention: No ascites noted on ultrasound unlikely related to peritoneal carcinomatosis. No GI symptoms noted.  5. Left breast mass: She status post biopsy with the pathology indicating T2 N1 disease with the tumor have ER positive, PR negative HER-2 negative. Her Ki-67 is 50%.  Mammogram obtained on 03/20/2016 showed excellent response to systemic chemotherapy. No primary surgical therapy is indicated at this time. The plan is to continue to monitor her breast cancer given her ovarian malignancy.  8. Neutropenia: Neulasta will be given after a cycles of therapy. Her absolute neutrophil count is 1.9 and should be adequate to proceed.  9. Chest wall cellulitis: This is resolved at this time. No evidence of recurrent infections.  10. Nausea: Prescription for Compazine will be made available to patient in addition to Zofran. No nausea issue reported at this time.  11. Hypertension: Blood pressure is adequately controlled at this time.  12. The followup: She will in 3 weeks for the next cycle of therapy.    [facility], MD 4/18/20189:32 AM

## 2016-06-06 NOTE — Telephone Encounter (Signed)
Appointments scheduled per 06/06/16 los. Patient refused a copyo f the AVS report and appointment schedule. Will review on MyChart.

## 2016-06-06 NOTE — Progress Notes (Signed)
Per Dr. Alen Blew, it's OK to treat with a platelet count of 58 today. Infusion room nurse notified.

## 2016-06-06 NOTE — Patient Instructions (Signed)
[name] Discharge Instructions for Patients Receiving Chemotherapy  Today you received the following chemotherapy agents: Carboplatin   To help prevent nausea and vomiting after your treatment, we encourage you to take your nausea medication as directed.    If you develop nausea and vomiting that is not controlled by your nausea medication, call the clinic.   BELOW ARE SYMPTOMS THAT SHOULD BE REPORTED IMMEDIATELY:  *FEVER GREATER THAN 100.5 F  *CHILLS WITH OR WITHOUT FEVER  NAUSEA AND VOMITING THAT IS NOT CONTROLLED WITH YOUR NAUSEA MEDICATION  *UNUSUAL SHORTNESS OF BREATH  *UNUSUAL BRUISING OR BLEEDING  TENDERNESS IN MOUTH AND THROAT WITH OR WITHOUT PRESENCE OF ULCERS  *URINARY PROBLEMS  *BOWEL PROBLEMS  UNUSUAL RASH Items with * indicate a potential emergency and should be followed up as soon as possible.  Feel free to call the clinic you have any questions or concerns. The clinic phone number is [phone].  Please show the CHEMO ALERT CARD at check-in to the Emergency Department and triage nurse.   

## 2016-06-06 NOTE — Progress Notes (Signed)
[place] [facility]  Clinical Social Work was referred by patient to review and complete healthcare advance directives.  Clinical Social Worker met with patient previously to review advance directives and met with patient and spouse today in [place] office to complete.  The patient designated spouse, Cecilie Lowers, as their primary healthcare agent and brother, Marcello Moores, as their secondary agent.  Patient also completed healthcare living will.    Clinical Social Worker notarized documents and made copies for patient/family. Clinical Social Worker will send documents to medical records to be scanned into patient's chart. Clinical Social Worker encouraged patient/family to contact with any additional questions or concerns.  Maryjean Morn, MSW, LCSW Clinical Social Worker [facility] [phone]

## 2016-06-06 NOTE — Progress Notes (Signed)
Carbo skin test dose negative. Pt does have clotted raised bump at injection site LAC. Pt states that it is due to her low platelets. She normally will have this happen after each skin test injection.

## 2016-06-07 ENCOUNTER — Ambulatory Visit (HOSPITAL_BASED_OUTPATIENT_CLINIC_OR_DEPARTMENT_OTHER): Payer: PPO

## 2016-06-07 ENCOUNTER — Ambulatory Visit: Payer: PPO

## 2016-06-07 VITALS — BP 117/72 | HR 68 | Temp 97.8°F | Resp 18

## 2016-06-07 DIAGNOSIS — Z5189 Encounter for other specified aftercare: Secondary | ICD-10-CM | POA: Diagnosis not present

## 2016-06-07 DIAGNOSIS — C482 Malignant neoplasm of peritoneum, unspecified: Secondary | ICD-10-CM

## 2016-06-07 LAB — CA 125: Cancer Antigen (CA) 125: 24.6 U/mL (ref 0.0–38.1)

## 2016-06-07 MED ORDER — PEGFILGRASTIM INJECTION 6 MG/0.6ML ~~LOC~~
6.0000 mg | PREFILLED_SYRINGE | Freq: Once | SUBCUTANEOUS | Status: AC
  Administered 2016-06-07: 6 mg via SUBCUTANEOUS
  Filled 2016-06-07: qty 0.6

## 2016-06-11 ENCOUNTER — Encounter: Payer: Self-pay | Admitting: Internal Medicine

## 2016-06-11 ENCOUNTER — Ambulatory Visit (INDEPENDENT_AMBULATORY_CARE_PROVIDER_SITE_OTHER): Payer: PPO | Admitting: Internal Medicine

## 2016-06-11 VITALS — BP 118/68 | HR 81 | Ht 61.0 in | Wt 157.0 lb

## 2016-06-11 DIAGNOSIS — I2510 Atherosclerotic heart disease of native coronary artery without angina pectoris: Secondary | ICD-10-CM | POA: Diagnosis not present

## 2016-06-11 DIAGNOSIS — E782 Mixed hyperlipidemia: Secondary | ICD-10-CM | POA: Diagnosis not present

## 2016-06-11 NOTE — Patient Instructions (Signed)
Your physician recommends that you continue on your current medications as directed. Please refer to the Current Medication list given to you today. Your physician wants you to follow-up in: 6 months with Dr. Ross.  You will receive a reminder letter in the mail two months in advance. If you don't receive a letter, please call our office to schedule the follow-up appointment.  

## 2016-06-14 ENCOUNTER — Other Ambulatory Visit: Payer: Self-pay | Admitting: *Deleted

## 2016-06-14 NOTE — Progress Notes (Signed)
Updated medication list

## 2016-06-14 NOTE — Progress Notes (Signed)
Left message on patients VM to stop clopidogrel.  Told her to call office back if had questions

## 2016-06-20 ENCOUNTER — Institutional Professional Consult (permissible substitution): Payer: PPO | Admitting: Pulmonary Disease

## 2016-06-25 ENCOUNTER — Encounter (HOSPITAL_COMMUNITY): Payer: Self-pay

## 2016-06-25 ENCOUNTER — Ambulatory Visit (HOSPITAL_COMMUNITY)
Admission: RE | Admit: 2016-06-25 | Discharge: 2016-06-25 | Disposition: A | Discharge status: Home / Self Care | Payer: PPO | Source: Ambulatory Visit | Attending: Oncology | Admitting: Oncology

## 2016-06-25 DIAGNOSIS — C482 Malignant neoplasm of peritoneum, unspecified: Secondary | ICD-10-CM | POA: Diagnosis not present

## 2016-06-25 DIAGNOSIS — I700 Atherosclerosis of aorta: Secondary | ICD-10-CM | POA: Insufficient documentation

## 2016-06-25 DIAGNOSIS — K769 Liver disease, unspecified: Secondary | ICD-10-CM | POA: Diagnosis not present

## 2016-06-25 DIAGNOSIS — C481 Malignant neoplasm of specified parts of peritoneum: Secondary | ICD-10-CM | POA: Diagnosis not present

## 2016-06-25 DIAGNOSIS — I714 Abdominal aortic aneurysm, without rupture: Secondary | ICD-10-CM | POA: Diagnosis not present

## 2016-06-25 MED ORDER — HEPARIN SOD (PORK) LOCK FLUSH 100 UNIT/ML IV SOLN: 500.0000 [IU] | Freq: Once | INTRAVENOUS | Status: DC

## 2016-06-25 MED ORDER — HEPARIN SOD (PORK) LOCK FLUSH 100 UNIT/ML IV SOLN
INTRAVENOUS | Status: AC
  Administered 2016-06-25: 500 [IU]
  Filled 2016-06-25: qty 5

## 2016-06-25 MED ORDER — IOPAMIDOL (ISOVUE-300) INJECTION 61%
100.0000 mL | Freq: Once | INTRAVENOUS | Status: AC | PRN
  Administered 2016-06-25: 100 mL via INTRAVENOUS

## 2016-06-25 MED ORDER — IOPAMIDOL (ISOVUE-300) INJECTION 61%
INTRAVENOUS | Status: AC
  Filled 2016-06-25: qty 100

## 2016-06-27 ENCOUNTER — Ambulatory Visit (HOSPITAL_BASED_OUTPATIENT_CLINIC_OR_DEPARTMENT_OTHER): Payer: PPO | Admitting: Oncology

## 2016-06-27 ENCOUNTER — Other Ambulatory Visit (HOSPITAL_BASED_OUTPATIENT_CLINIC_OR_DEPARTMENT_OTHER): Payer: PPO

## 2016-06-27 ENCOUNTER — Ambulatory Visit (HOSPITAL_BASED_OUTPATIENT_CLINIC_OR_DEPARTMENT_OTHER): Payer: PPO

## 2016-06-27 ENCOUNTER — Ambulatory Visit: Payer: PPO

## 2016-06-27 ENCOUNTER — Telehealth: Payer: Self-pay | Admitting: Oncology

## 2016-06-27 VITALS — BP 137/60 | HR 58 | Temp 97.8°F | Resp 18 | Ht 61.0 in | Wt 158.4 lb

## 2016-06-27 DIAGNOSIS — D696 Thrombocytopenia, unspecified: Secondary | ICD-10-CM | POA: Diagnosis not present

## 2016-06-27 DIAGNOSIS — C50412 Malignant neoplasm of upper-outer quadrant of left female breast: Secondary | ICD-10-CM

## 2016-06-27 DIAGNOSIS — Z5111 Encounter for antineoplastic chemotherapy: Secondary | ICD-10-CM

## 2016-06-27 DIAGNOSIS — C482 Malignant neoplasm of peritoneum, unspecified: Secondary | ICD-10-CM | POA: Diagnosis not present

## 2016-06-27 DIAGNOSIS — Z170 Estrogen receptor positive status [ER+]: Secondary | ICD-10-CM | POA: Diagnosis not present

## 2016-06-27 DIAGNOSIS — C50919 Malignant neoplasm of unspecified site of unspecified female breast: Secondary | ICD-10-CM

## 2016-06-27 DIAGNOSIS — Z95828 Presence of other vascular implants and grafts: Secondary | ICD-10-CM

## 2016-06-27 LAB — CBC WITH DIFFERENTIAL/PLATELET
BASO%: 0.7 % (ref 0.0–2.0)
BASOS ABS: 0 10*3/uL (ref 0.0–0.1)
EOS%: 1.3 % (ref 0.0–7.0)
Eosinophils Absolute: 0 10*3/uL (ref 0.0–0.5)
HCT: 31.8 % — ABNORMAL LOW (ref 34.8–46.6)
HGB: 10.2 g/dL — ABNORMAL LOW (ref 11.6–15.9)
LYMPH#: 1.1 10*3/uL (ref 0.9–3.3)
LYMPH%: 43.8 % (ref 14.0–49.7)
MCH: 31.7 pg (ref 25.1–34.0)
MCHC: 32 g/dL (ref 31.5–36.0)
MCV: 99 fL (ref 79.5–101.0)
MONO#: 0.3 10*3/uL (ref 0.1–0.9)
MONO%: 12.2 % (ref 0.0–14.0)
NEUT%: 42 % (ref 38.4–76.8)
NEUTROS ABS: 1.1 10*3/uL — AB (ref 1.5–6.5)
Platelets: 63 10*3/uL — ABNORMAL LOW (ref 145–400)
RBC: 3.21 10*6/uL — AB (ref 3.70–5.45)
RDW: 16.3 % — AB (ref 11.2–14.5)
WBC: 2.6 10*3/uL — ABNORMAL LOW (ref 3.9–10.3)

## 2016-06-27 LAB — COMPREHENSIVE METABOLIC PANEL
ALT: 8 U/L (ref 0–55)
ANION GAP: 8 meq/L (ref 3–11)
AST: 11 U/L (ref 5–34)
Albumin: 3.6 g/dL (ref 3.5–5.0)
Alkaline Phosphatase: 85 U/L (ref 40–150)
BILIRUBIN TOTAL: 0.32 mg/dL (ref 0.20–1.20)
BUN: 8.8 mg/dL (ref 7.0–26.0)
CHLORIDE: 107 meq/L (ref 98–109)
CO2: 28 meq/L (ref 22–29)
CREATININE: 0.6 mg/dL (ref 0.6–1.1)
Calcium: 9.2 mg/dL (ref 8.4–10.4)
EGFR: >90 mL/min/{1.73_m2} (ref >90)
GLUCOSE: 91 mg/dL (ref 70–140)
Potassium: 4.3 mEq/L (ref 3.5–5.1)
SODIUM: 143 meq/L (ref 136–145)
TOTAL PROTEIN: 7.2 g/dL (ref 6.4–8.3)

## 2016-06-27 MED ORDER — PALONOSETRON HCL INJECTION 0.25 MG/5ML
INTRAVENOUS | Status: AC
  Filled 2016-06-27: qty 5

## 2016-06-27 MED ORDER — HEPARIN SOD (PORK) LOCK FLUSH 100 UNIT/ML IV SOLN
500.0000 [IU] | Freq: Once | INTRAVENOUS | Status: AC | PRN
  Administered 2016-06-27: 500 [IU]
  Filled 2016-06-27: qty 5

## 2016-06-27 MED ORDER — DEXAMETHASONE SODIUM PHOSPHATE 10 MG/ML IJ SOLN
INTRAMUSCULAR | Status: AC
  Filled 2016-06-27: qty 1

## 2016-06-27 MED ORDER — SODIUM CHLORIDE 0.9 % IJ SOLN
10.0000 mL | INTRAMUSCULAR | Status: DC | PRN
  Administered 2016-06-27: 10 mL via INTRAVENOUS
  Filled 2016-06-27: qty 10

## 2016-06-27 MED ORDER — DEXAMETHASONE SODIUM PHOSPHATE 10 MG/ML IJ SOLN
4.0000 mg | Freq: Once | INTRAMUSCULAR | Status: AC
  Administered 2016-06-27: 4 mg via INTRAVENOUS

## 2016-06-27 MED ORDER — SODIUM CHLORIDE 0.9 % IV SOLN
Freq: Once | INTRAVENOUS | Status: AC
  Administered 2016-06-27: 11:00:00 via INTRAVENOUS

## 2016-06-27 MED ORDER — SODIUM CHLORIDE 0.9 % IV SOLN
570.0000 mg | Freq: Once | INTRAVENOUS | Status: AC
  Administered 2016-06-27: 570 mg via INTRAVENOUS
  Filled 2016-06-27: qty 57

## 2016-06-27 MED ORDER — PALONOSETRON HCL INJECTION 0.25 MG/5ML
0.2500 mg | Freq: Once | INTRAVENOUS | Status: AC
  Administered 2016-06-27: 0.25 mg via INTRAVENOUS

## 2016-06-27 MED ORDER — SODIUM CHLORIDE 0.9% FLUSH
10.0000 mL | INTRAVENOUS | Status: DC | PRN
  Administered 2016-06-27: 10 mL
  Filled 2016-06-27: qty 10

## 2016-06-27 MED ORDER — CARBOPLATIN CHEMO INTRADERMAL TEST DOSE 100MCG/0.02ML
100.0000 ug | Freq: Once | INTRADERMAL | Status: AC
  Administered 2016-06-27: 100 ug via INTRADERMAL
  Filled 2016-06-27: qty 0.02

## 2016-06-27 NOTE — Progress Notes (Signed)
Per dr Alen Blew, okay to treat today despite counts.

## 2016-06-27 NOTE — Patient Instructions (Signed)

## 2016-06-27 NOTE — Addendum Note (Signed)
Addended by: Wyatt Portela on: 06/27/2016 10:52 AM   Modules accepted: Orders

## 2016-06-27 NOTE — Telephone Encounter (Signed)
Appointments scheduled per 5.9.18 LOS. Patient opted to use MyChart for schedule.

## 2016-06-27 NOTE — Patient Instructions (Signed)
[facility] Discharge Instructions for Patients Receiving Chemotherapy  Today you received the following chemotherapy agents: Carboplatin   To help prevent nausea and vomiting after your treatment, we encourage you to take your nausea medication as directed.    If you develop nausea and vomiting that is not controlled by your nausea medication, call the clinic.   BELOW ARE SYMPTOMS THAT SHOULD BE REPORTED IMMEDIATELY:  *FEVER GREATER THAN 100.5 F  *CHILLS WITH OR WITHOUT FEVER  NAUSEA AND VOMITING THAT IS NOT CONTROLLED WITH YOUR NAUSEA MEDICATION  *UNUSUAL SHORTNESS OF BREATH  *UNUSUAL BRUISING OR BLEEDING  TENDERNESS IN MOUTH AND THROAT WITH OR WITHOUT PRESENCE OF ULCERS  *URINARY PROBLEMS  *BOWEL PROBLEMS  UNUSUAL RASH Items with * indicate a potential emergency and should be followed up as soon as possible.  Feel free to call the clinic you have any questions or concerns. The clinic phone number is [phone].  Please show the CHEMO ALERT CARD at check-in to the Emergency Department and triage nurse.   

## 2016-06-27 NOTE — Progress Notes (Signed)
Hematology and Oncology Follow Up Visit  [name] [id number] 02-Aug-1955 61 y.o. 06/27/2016 10:20 AM    Principle Diagnosis: 61 year old woman diagnosed with:  1. 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 This was diagnosed in July of 2014. 2. 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 The tumor is ER positive PR negative, HER-2 negative.   Prior Therapy:  She is status post paracentesis performed on 09/17/2012 with the cytology confirmed the presence of adenocarcinoma and immunohistochemical stains suggest GYN etiology. She is also status post lumpectomy for breast cancer diagnosed 25 years ago followed by radiation and chemotherapy under the care of Dr. Sonny Dandy. She did not receive any hormonal therapy. Chemotherapy utilizing carboplatin and Taxotere started on 10/01/2012. Avastin was added with cycle 4. This was discontinued in June of 2015. She is S/P Avastin maintenance only between June 2015 till March 2017. Therapy discontinued due to progression of disease. She is status post Doxil salvage chemotherapy therapy discontinued because of hypersensitivity reaction in March 2017.  Current therapy:  Carboplatin and AUC of 5 every 3 weeks cycle 1 started on 07/08/2015. She is here for cycle 17 of therapy.  Interim History: [name] presents today for a followup visit with her husband. Since her last visit, she reports doing reasonably well without any recent complaints. She denied any complications related to last chemotherapy cycle. She reports mild fatigue but resolves after a few days of chemotherapy. She reports her appetite slows down but picks up later. She reports no nausea, vomiting or abdominal pain. She did report some mild fatigue and tiredness associated with Neulasta. She denied any abdominal pain or satiety. She denied any epistaxis,  hematochezia or melena. She denied any fevers or recurrent infections. She denied any breast tenderness or masses.  She does not report any headaches, blurry vision, syncope or seizures. She denied any fever chills. She does report some chest discomfort but no dyspnea on exertion. Has not reported any shortness of breath or cough. She denied any pain with swallowing. She does not report any vomiting, constipation does report some occasional diarrhea. She denied any GYN bleeding. She reports no anxiety or depression. Remainder of the review of systems is unremarkable.  Medications: I have reviewed the patient's current medications.  Current Outpatient Prescriptions  Medication Sig Dispense Refill  . acetaminophen (TYLENOL) 325 MG tablet Take 2 tablets (650 mg total) by mouth every 4 (four) hours as needed for mild pain, fever or headache. 30 tablet 0  . Acetylcarnitine HCl (ACETYL L-CARNITINE PO) Take 2,000 mg by mouth at bedtime.    . ALPRAZolam (XANAX) 0.5 MG tablet Take 0.5 mg by mouth 2 (two) times daily as needed for anxiety.    Marland Kitchen aspirin 81 MG chewable tablet Chew 1 tablet (81 mg total) by mouth daily. 30 tablet 0  . atorvastatin (LIPITOR) 40 MG tablet Take 1 tablet (40 mg total) by mouth daily at 6 PM. 90 tablet 3  . carvedilol (COREG) 3.125 MG tablet Take 1/2 tablet by mouth twice daily with meals. 30 tablet 9  . clotrimazole-betamethasone (LOTRISONE) cream Apply 1 application topically 2 (two) times daily. 30 g 1  . divalproex (DEPAKOTE) 250 MG DR tablet Take by mouth. Take 567ms in the morning and 7573m at night    . fluticasone (FLONASE) 50 MCG/ACT nasal spray INHALE 2 SPRAYS INTO EACH NOSTRIL EVERY NIGHT  11  . gabapentin (NEURONTIN) 100 MG capsule Take 1 capsule by mouth twice daily. 180 capsule 3  . lamoTRIgine (LAMICTAL) 100 MG tablet Take 100 mg by mouth 2 (two) times daily.    Marland Kitchen levothyroxine (SYNTHROID, LEVOTHROID) 100 MCG tablet Take 1 tablet (100 mcg total) by mouth daily  before breakfast. 90 tablet 3  . lidocaine-prilocaine (EMLA) cream Apply topically as needed. Apply to port with every chemotherapy. 30 g 0  . loperamide (IMODIUM A-D) 2 MG tablet Take 2 mg by mouth as needed for diarrhea or loose stools.    Marland Kitchen loratadine (CLARITIN) 10 MG tablet Take 10 mg by mouth daily as needed for allergies (with chemo).    . nitroGLYCERIN (NITROSTAT) 0.4 MG SL tablet Place 1 tablet (0.4 mg total) under the tongue every 5 (five) minutes as needed for chest pain. 25 tablet 3  . ondansetron (ZOFRAN) 8 MG tablet TAKE 1 TABLET BY MOUTH EVERY 8 HOURS AS NEEDED FOR NAUSEA AND VOMITING 30 tablet 2  . pantoprazole (PROTONIX) 40 MG tablet Take 1 tablet (40 mg total) by mouth daily. 30 tablet 11  . prochlorperazine (COMPAZINE) 10 MG tablet Take 1 tablet (10 mg total) by mouth every 6 (six) hours as needed for nausea or vomiting. 30 tablet 0  . rOPINIRole (REQUIP) 0.5 MG tablet Take 3 tablets (1.5 mg total) by mouth at bedtime. 135 tablet 1  . sertraline (ZOLOFT) 50 MG tablet      No current facility-administered medications for this visit.    Facility-Administered Medications Ordered in Other Visits  Medication Dose Route Frequency Provider Last Rate Last Dose  . heparin lock flush 100 unit/mL  500 Units Intravenous Once Wyatt Portela, MD      . sodium chloride flush (NS) 0.9 % injection 10 mL  10 mL Intravenous PRN Wyatt Portela, MD         Allergies:  Allergies  Allergen Reactions  . Doxil [Doxorubicin Hcl Liposomal] Anaphylaxis    1st Doxil.   . Pollen Extract Other (See Comments)    Pollen and grass causes a lot sneezing    Past Medical History, Surgical history, Social history, and Family History were reviewed and updated.   Physical Exam: Blood pressure 137/60, pulse (!) 58, temperature 97.8 F (36.6 C), temperature source Oral, resp. rate 18, height _0  (1.549 m), weight 158 lb 6.4 oz (71.8 kg), SpO2 100 %.   ECOG: 1 General appearance:  Head:  Normocephalic, without obvious abnormality. No oral thrush or ulcers. Neck: no adenopathy or masses. Lymph nodes: Cervical, supraclavicular, and axillary nodes normal. Heart:regular rate and rhythm, S1, S2 normal, no murmur, click, rub or gallop Lung:chest clear, no wheezing, rales, normal symmetric. Chest wall examination:  Port-A-Cath site without any erythema or induration. Abdomin: Soft, nontender with good bowel sounds. No shifting dullness or ascites. EXT:no erythema, induration, or nodules. No edema. Skin: No rashes or lesions. No ecchymosis.  Lab Results: Lab Results  Component Value Date   WBC 2.6 (L) 06/27/2016   HGB 10.2 (L) 06/27/2016   HCT 31.8 (L) 06/27/2016   MCV 99.0 06/27/2016   PLT 63 (L) 06/27/2016     Chemistry      Component Value Date/Time   NA 141 06/06/2016 0846   K 4.2 06/06/2016 0846   CL 104 04/16/2016 0806   CO2 26 06/06/2016 0846   BUN 16.1 06/06/2016 0846   CREATININE 0.7 06/06/2016 0846      Component Value Date/Time  CALCIUM 9.2 06/06/2016 0846   ALKPHOS 101 06/06/2016 0846   AST 13 06/06/2016 0846   ALT 6 06/06/2016 0846   BILITOT 0.36 06/06/2016 0846      Results for MASHELL, SIEBEN (MRN [id number]) as of 06/27/2016 10:06  Ref. Range 04/26/2016 08:53 05/16/2016 09:29 06/06/2016 08:45  Cancer Antigen (CA) 125 Latest Ref Range: 0.0 - 38.1 U/mL 23.6 24.7 24.6    EXAM: CT CHEST, ABDOMEN, AND PELVIS WITH CONTRAST  TECHNIQUE: Multidetector CT imaging of the chest, abdomen and pelvis was performed following the standard protocol during bolus administration of intravenous contrast.  CONTRAST:  113m ISOVUE-300 IOPAMIDOL (ISOVUE-300) INJECTION 61%  COMPARISON:  Chest CT 10/25/2015.  Abdomen and pelvis CT 02/17/2016  FINDINGS: CT CHEST FINDINGS  Cardiovascular: The heart size is normal. No pericardial effusion. No thoracic aortic aneurysm. Right Port-A-Cath tip is in the distal SVC.  Mediastinum/Nodes: No mediastinal  lymphadenopathy. There is no hilar lymphadenopathy. The esophagus has normal imaging features. Surgical clips noted right axilla.  Lungs/Pleura: Lungs are clear bilaterally.  Musculoskeletal: Bone windows reveal no worrisome lytic or sclerotic osseous lesions.  CT ABDOMEN PELVIS FINDINGS  Hepatobiliary: Nodular hepatic contour is compatible with cirrhosis. Gallbladder decompressed. No intrahepatic or extrahepatic biliary dilation.  Pancreas: No focal mass lesion. No dilatation of the main duct. No intraparenchymal cyst. No peripancreatic edema.  Spleen: No splenomegaly. No focal mass lesion.  Adrenals/Urinary Tract: No adrenal nodule or mass. 4 mm low-density lesion interpolar right kidney on the previous study is 6 mm today and remains too small to characterize. Left kidney unremarkable. No evidence for hydroureter. The urinary bladder appears normal for the degree of distention.  Stomach/Bowel: Stomach is nondistended. No gastric wall thickening. No evidence of outlet obstruction. Duodenum is normally positioned as is the ligament of Treitz. No small bowel wall thickening. No small bowel dilatation. The terminal ileum is normal. The appendix is normal. No gross colonic mass. No colonic wall thickening. No substantial diverticular change.  Vascular/Lymphatic: There is abdominal aortic atherosclerosis without aneurysm. There is no gastrohepatic or hepatoduodenal ligament lymphadenopathy. No intraperitoneal or retroperitoneal lymphadenopathy. No pelvic sidewall lymphadenopathy. 8 mm short axis right pelvic sidewall lymph node (image 98 series 2) is new in the interval.  Reproductive: The uterus has normal CT imaging appearance. There is no adnexal mass.  Other: No intraperitoneal free fluid.  The infra hepatic right omental disease measures 1.4 cm and thickness on coronal image 46 of series 5. This compares to 1.6 cm previously. The midline omental disease  measures 1.5 cm in thickness on sagittal imaging today (image 69 series 6) compared to 1.7 cm previously. The midline omental disease measured on axial imaging previously at 11.8 x 1.9 cm measures 11.3 x 1.7 cm on axial imaging all today.  Musculoskeletal: Bone windows reveal no worrisome lytic or sclerotic osseous lesions.  IMPRESSION: 1. No evidence for metastatic disease in the chest. 2. The omental disease measured on the prior study is minimally decreased in the interval. 3. New small right pelvic sidewall lymph node not enlarged by CT criteria. Attention on follow-up suggested. 4. Persistent nodularity of liver contour suggests cirrhosis. 5. Abdominal aortic atherosclerosis.  Impression and Plan:  61year old woman with the following issues:  1. Peritoneal carcinomatosis with omental involvement. The pathology confirmed the presence of adenocarcinoma of likely GYN etiology. Her CA 125 was elevated at 333.5 on 09/19/2012. She is status post systemic chemotherapy with an excellent response utilizing carboplatin and Taxotere and a Avastin . This therapy was  held in June of 2015 and currently on Avastin maintenance.   CT scan obtained on 05/05/2015 as well as her tumor markers showed clear progression. Her CA-125 is up to 189 and her CT scan showed increase in her peritoneal disease.   She is currently receiving salvage therapy with carboplatin single agent at AUC of 5 With excellent response.  CT scan obtained on 06/25/2016 was reviewed today and continues to show stable disease.  The plan is to continue with the current dose and schedule for today and we will resume chemotherapy in 6 weeks. She requested a 3 week break from chemotherapy which is reasonable to do at this time.  2. IV access: Port-A-Cath is without complications.  3. Thrombocytopenia: Her platelets remains relatively stable without bleeding.  4. Abdominal distention: No ascites noted on ultrasound unlikely  related to peritoneal carcinomatosis. No GI symptoms noted.  5. Left breast mass: She status post biopsy with the pathology indicating T2 N1 disease with the tumor have ER positive, PR negative HER-2 negative. Her Ki-67 is 50%.  Mammogram obtained on 03/20/2016 showed excellent response to systemic chemotherapy. No primary surgical therapy is indicated at this time. We will continue to monitor..  8. Neutropenia: Neulasta will be given after a cycles of therapy. Her absolute neutrophil count is 1.9 and should be adequate to proceed.  9. Chest wall cellulitis: This is resolved at this time. No evidence of recurrent infections.  10. Nausea: We'll manage at this time with Compazine.  11. Hypertension: Blood pressure is adequately controlled at this time.  12. The followup: She will in 6 weeks for the next cycle of therapy.    Aleenah Homen, MD 5/9/201810:20 AM

## 2016-06-27 NOTE — Progress Notes (Signed)
Carbo test dose negative. Carbolatin  Released

## 2016-06-28 ENCOUNTER — Ambulatory Visit (HOSPITAL_BASED_OUTPATIENT_CLINIC_OR_DEPARTMENT_OTHER): Payer: PPO

## 2016-06-28 VITALS — BP 126/69 | HR 68 | Temp 98.7°F | Resp 20

## 2016-06-28 DIAGNOSIS — C482 Malignant neoplasm of peritoneum, unspecified: Secondary | ICD-10-CM

## 2016-06-28 DIAGNOSIS — Z5189 Encounter for other specified aftercare: Secondary | ICD-10-CM | POA: Diagnosis not present

## 2016-06-28 LAB — CA 125: CANCER ANTIGEN (CA) 125: 16.1 U/mL (ref 0.0–38.1)

## 2016-06-28 MED ORDER — PEGFILGRASTIM INJECTION 6 MG/0.6ML ~~LOC~~
6.0000 mg | PREFILLED_SYRINGE | Freq: Once | SUBCUTANEOUS | Status: AC
  Administered 2016-06-28: 6 mg via SUBCUTANEOUS
  Filled 2016-06-28: qty 0.6

## 2016-06-28 NOTE — Patient Instructions (Signed)
Pegfilgrastim injection What is this medicine? PEGFILGRASTIM (PEG fil gra stim) is a long-acting granulocyte colony-stimulating factor that stimulates the growth of neutrophils, a type of white blood cell important in the body's fight against infection. It is used to reduce the incidence of fever and infection in patients with certain types of cancer who are receiving chemotherapy that affects the bone marrow, and to increase survival after being exposed to high doses of radiation. This medicine may be used for other purposes; ask your health care provider or pharmacist if you have questions. What should I tell my health care provider before I take this medicine? They need to know if you have any of these conditions: -kidney disease -latex allergy -ongoing radiation therapy -sickle cell disease -skin reactions to acrylic adhesives (On-Body Injector only) -an unusual or allergic reaction to pegfilgrastim, filgrastim, other medicines, foods, dyes, or preservatives -pregnant or trying to get pregnant -breast-feeding How should I use this medicine? This medicine is for injection under the skin. If you get this medicine at home, you will be taught how to prepare and give the pre-filled syringe or how to use the On-body Injector. Refer to the patient Instructions for Use for detailed instructions. Use exactly as directed. Take your medicine at regular intervals. Do not take your medicine more often than directed. It is important that you put your used needles and syringes in a special sharps container. Do not put them in a trash can. If you do not have a sharps container, call your pharmacist or healthcare provider to get one. Talk to your pediatrician regarding the use of this medicine in children. While this drug may be prescribed for selected conditions, precautions do apply. Overdosage: If you think you have taken too much of this medicine contact a poison control center or emergency room at  once. NOTE: This medicine is only for you. Do not share this medicine with others. What if I miss a dose? It is important not to miss your dose. Call your doctor or health care professional if you miss your dose. If you miss a dose due to an On-body Injector failure or leakage, a new dose should be administered as soon as possible using a single prefilled syringe for manual use. What may interact with this medicine? Interactions have not been studied. Give your health care provider a list of all the medicines, herbs, non-prescription drugs, or dietary supplements you use. Also tell them if you smoke, drink alcohol, or use illegal drugs. Some items may interact with your medicine. This list may not describe all possible interactions. Give your health care provider a list of all the medicines, herbs, non-prescription drugs, or dietary supplements you use. Also tell them if you smoke, drink alcohol, or use illegal drugs. Some items may interact with your medicine. What should I watch for while using this medicine? You may need blood work done while you are taking this medicine. If you are going to need a MRI, CT scan, or other procedure, tell your doctor that you are using this medicine (On-Body Injector only). What side effects may I notice from receiving this medicine? Side effects that you should report to your doctor or health care professional as soon as possible: -allergic reactions like skin rash, itching or hives, swelling of the face, lips, or tongue -dizziness -fever -pain, redness, or irritation at site where injected -pinpoint red spots on the skin -red or dark-brown urine -shortness of breath or breathing problems -stomach or side pain, or pain   at the shoulder -swelling -tiredness -trouble passing urine or change in the amount of urine Side effects that usually do not require medical attention (report to your doctor or health care professional if they continue or are  bothersome): -bone pain -muscle pain This list may not describe all possible side effects. Call your doctor for medical advice about side effects. You may report side effects to FDA at 1-800-FDA-1088. Where should I keep my medicine? Keep out of the reach of children. Store pre-filled syringes in a refrigerator between 2 and 8 degrees C (36 and 46 degrees F). Do not freeze. Keep in carton to protect from light. Throw away this medicine if it is left out of the refrigerator for more than 48 hours. Throw away any unused medicine after the expiration date. NOTE: This sheet is a summary. It may not cover all possible information. If you have questions about this medicine, talk to your doctor, pharmacist, or health care provider.    2016, Elsevier/Gold Standard. (2014-02-25 14:30:14)  

## 2016-07-03 ENCOUNTER — Other Ambulatory Visit: Payer: Self-pay | Admitting: Family Medicine

## 2016-07-09 ENCOUNTER — Encounter: Payer: Self-pay | Admitting: Family Medicine

## 2016-07-09 ENCOUNTER — Telehealth: Payer: Self-pay

## 2016-07-09 ENCOUNTER — Ambulatory Visit (INDEPENDENT_AMBULATORY_CARE_PROVIDER_SITE_OTHER): Payer: PPO | Admitting: Family Medicine

## 2016-07-09 VITALS — BP 122/80 | HR 98 | Resp 12 | Ht 61.0 in | Wt 158.0 lb

## 2016-07-09 DIAGNOSIS — S0006XA Insect bite (nonvenomous) of scalp, initial encounter: Secondary | ICD-10-CM

## 2016-07-09 DIAGNOSIS — W57XXXA Bitten or stung by nonvenomous insect and other nonvenomous arthropods, initial encounter: Secondary | ICD-10-CM

## 2016-07-09 MED ORDER — CLOBETASOL PROPIONATE 0.05 % EX FOAM: Freq: Two times a day (BID) | CUTANEOUS | 0 refills | Status: DC

## 2016-07-09 NOTE — Patient Instructions (Signed)
A few things to remember from today's visit:   Insect bite of scalp, initial encounter - Plan: clobetasol (OLUX) 0.05 % topical foam  Keep area clean,avoid scratching, it is not contagious,and usually self limited.   Please be sure medication list is accurate. If a new problem present, please set up appointment sooner than planned today.

## 2016-07-09 NOTE — Telephone Encounter (Signed)
Pt had several gnat bites on her head. They are itching but do not appear infected. 1 looked like a pimple but is gone. She is using vinegar on her head and keeping her hair washed. She denies any fever or redness or swelling or other pussy areas besides the one pimple. She was asking if they were contagious and assured her they are not. Instructed to watch for signs of infection.

## 2016-07-09 NOTE — Progress Notes (Signed)
HPI:   ACUTE VISIT:  No chief complaint on file.   [name] is a 61 y.o. female, who is here today complaining of pruritic rash on scalp after gnats bite 3-4 days ago. She was in a funeral and noted many gnats flying around her head, next day she noted erythematous pruritic lesions on scalp. She is concerned about the risk of infection and worry about this being contagious. Rash is not tender, denies tingling on numbness.  She denies any new medication, detergent, soap, or body product. No other outdoor exposures. No sick contact. No similar rash in the past.  OTC medication for this problem: None but she applied Vinegar.   She denies chills,fever,worsening fatigue,oral lesions/edema,cough, wheezing, dyspnea, abdominal pain, nausea, or vomiting.  Problem seems to be stable.   Review of Systems  Constitutional: Positive for fatigue (no more than usual). Negative for chills and fever.  HENT: Negative for congestion, facial swelling, mouth sores, sore throat, trouble swallowing and voice change.   Eyes: Negative for discharge and redness.  Respiratory: Negative for shortness of breath, wheezing and stridor.   Cardiovascular: Negative for leg swelling.  Musculoskeletal: Negative for myalgias and neck pain.  Skin: Positive for rash. Negative for wound.  Neurological: Negative for weakness, numbness and headaches.  Hematological: Negative for adenopathy.  Psychiatric/Behavioral: Negative for confusion. The patient is nervous/anxious.       Current Outpatient Prescriptions on File Prior to Visit  Medication Sig Dispense Refill  . acetaminophen (TYLENOL) 325 MG tablet Take 2 tablets (650 mg total) by mouth every 4 (four) hours as needed for mild pain, fever or headache. 30 tablet 0  . Acetylcarnitine HCl (ACETYL L-CARNITINE PO) Take 2,000 mg by mouth at bedtime.    . ALPRAZolam (XANAX) 0.5 MG tablet Take 0.5 mg by mouth 2 (two) times daily as needed for anxiety.     Marland Kitchen aspirin 81 MG chewable tablet Chew 1 tablet (81 mg total) by mouth daily. 30 tablet 0  . atorvastatin (LIPITOR) 40 MG tablet Take 1 tablet (40 mg total) by mouth daily at 6 PM. 90 tablet 3  . carvedilol (COREG) 3.125 MG tablet Take 1/2 tablet by mouth twice daily with meals. 30 tablet 9  . clotrimazole-betamethasone (LOTRISONE) cream Apply 1 application topically 2 (two) times daily. 30 g 1  . divalproex (DEPAKOTE) 250 MG DR tablet Take by mouth. Take 500mg s in the morning and 750mg s at night    . fluticasone (FLONASE) 50 MCG/ACT nasal spray INHALE 2 SPRAYS INTO EACH NOSTRIL EVERY NIGHT  11  . gabapentin (NEURONTIN) 100 MG capsule Take 1 capsule by mouth twice daily. 180 capsule 3  . lamoTRIgine (LAMICTAL) 100 MG tablet Take 100 mg by mouth 2 (two) times daily.    Marland Kitchen levothyroxine (SYNTHROID, LEVOTHROID) 100 MCG tablet Take 1 tablet (100 mcg total) by mouth daily before breakfast. 90 tablet 3  . lidocaine-prilocaine (EMLA) cream Apply topically as needed. Apply to port with every chemotherapy. 30 g 0  . loperamide (IMODIUM A-D) 2 MG tablet Take 2 mg by mouth as needed for diarrhea or loose stools.    Marland Kitchen loratadine (CLARITIN) 10 MG tablet Take 10 mg by mouth daily as needed for allergies (with chemo).    . nitroGLYCERIN (NITROSTAT) 0.4 MG SL tablet Place 1 tablet (0.4 mg total) under the tongue every 5 (five) minutes as needed for chest pain. 25 tablet 3  . ondansetron (ZOFRAN) 8 MG tablet TAKE 1 TABLET BY MOUTH  EVERY 8 HOURS AS NEEDED FOR NAUSEA AND VOMITING 30 tablet 2  . pantoprazole (PROTONIX) 40 MG tablet Take 1 tablet (40 mg total) by mouth daily. 30 tablet 11  . prochlorperazine (COMPAZINE) 10 MG tablet Take 1 tablet (10 mg total) by mouth every 6 (six) hours as needed for nausea or vomiting. 30 tablet 0  . rOPINIRole (REQUIP) 0.5 MG tablet Take 3 tablets (1.5 mg total) by mouth at bedtime. 135 tablet 2  . sertraline (ZOLOFT) 50 MG tablet      Current Facility-Administered Medications on  File Prior to Visit  Medication Dose Route Frequency Provider Last Rate Last Dose  . heparin lock flush 100 unit/mL  500 Units Intravenous Once Wyatt Portela, MD      . sodium chloride flush (NS) 0.9 % injection 10 mL  10 mL Intravenous PRN Wyatt Portela, MD         Past Medical History:  Diagnosis Date  . ADD (attention deficit disorder)   . Allergy    seasonal  . Anxiety   . Bipolar disorder ([place])   . Breast cancer ([place]) 1989   right breast  . Cancer ([place])    Omentum  . Coronary artery disease   . Depression   . Hematochezia   . History of echocardiogram    a. Limited Echo 9/17: EF 60-65%, no RWMA, Gr 1 DD, trivial AI, trivial MR, GLS -12% (likely underestimated), no pericardial effusion  . Hyperlipidemia   . Hypothyroidism   . Maintenance chemotherapy    Pt has chemo every 3 weeks (on Friday)  . NSTEMI (non-ST elevated myocardial infarction) ([place]) 10/26/2015  . Osteopenia 03/2009   t score -2.1 FRAX 4.6/0.4  . Premature menopause   . Sleep apnea    mild   Allergies  Allergen Reactions  . Doxil [Doxorubicin Hcl Liposomal] Anaphylaxis    1st Doxil.   . Pollen Extract Other (See Comments)    Pollen and grass causes a lot sneezing    Social History   Social History  . Marital status: Married    Spouse name: [name]  . Number of children: 0  . Years of education: 14   Occupational History  . unemployed [employer], [place]   Social History Main Topics  . Smoking status: Never Smoker  . Smokeless tobacco: Never Used  . Alcohol use 0.0 oz/week     Comment: very rarely - maybe 1 glass wine every 3 mos  . Drug use: No  . Sexual activity: Not Currently    Birth control/ protection: Post-menopausal     Comment: 1st intercourse 40 yo-5 partners   Other Topics Concern  . None   Social History Narrative   Lives at home with husband.    Caffeine use:  Tea/soda occass    Vitals:   07/09/16 1555  BP: 122/80  Pulse: 98  Resp: 12   Body mass index  is 29.85 kg/m.   Physical Exam  Nursing note and vitals reviewed. Constitutional: She is oriented to person, place, and time. She appears well-developed. She does not appear ill. No distress.  HENT:  Head: Atraumatic.  Mouth/Throat: Oropharynx is clear and moist and mucous membranes are normal.  Eyes: Conjunctivae are normal.  Respiratory: Effort normal. No respiratory distress.  Musculoskeletal: She exhibits no edema.  Lymphadenopathy:       Head (right side): No occipital adenopathy present.       Head (left side): No occipital adenopathy present.  She has no cervical adenopathy.  Neurological: She is alert and oriented to person, place, and time. She has normal strength. Gait normal.  Skin: Skin is warm. Rash noted. Rash is papular.  On parietal scalp R>L and a few scattered on upper frontal area micropapular mildly erythematous rash,no tender or edematous.  Psychiatric: Her speech is normal. Her mood appears anxious.  Well groomed, good eye contact.     ASSESSMENT AND PLAN:   Diagnoses and all orders for this visit:  Insect bite of scalp, initial encounter -     clobetasol (OLUX) 0.05 % topical foam; Apply topically 2 (two) times daily.   With mild local reaction. Topical steroid recommended, Clobetasol foam to apply just on scalps and for up to 14 days. Educated about some side effects of medication and to avoid face and folds. Keep lesions clean and avoid scratching to decrease risk on infection. Reassured in regard to contagiosity.  Instructed about warning signs and f/u as needed. She voices understanding.    Return if symptoms worsen or fail to improve.     Keyandre Pileggi G. [name], MD  [facility]. [place] office.

## 2016-07-12 ENCOUNTER — Other Ambulatory Visit: Payer: Self-pay | Admitting: Family Medicine

## 2016-07-12 MED ORDER — BETAMETHASONE DIPROPIONATE 0.05 % EX LOTN: TOPICAL_LOTION | Freq: Two times a day (BID) | CUTANEOUS | 0 refills | Status: AC

## 2016-07-18 ENCOUNTER — Ambulatory Visit: Payer: PPO | Admitting: Oncology

## 2016-07-18 ENCOUNTER — Other Ambulatory Visit: Payer: PPO

## 2016-07-18 ENCOUNTER — Ambulatory Visit: Payer: PPO

## 2016-07-19 ENCOUNTER — Ambulatory Visit: Payer: PPO

## 2016-07-23 ENCOUNTER — Encounter: Payer: Self-pay | Admitting: Oncology

## 2016-07-31 ENCOUNTER — Ambulatory Visit (INDEPENDENT_AMBULATORY_CARE_PROVIDER_SITE_OTHER): Payer: PPO | Admitting: Family Medicine

## 2016-07-31 ENCOUNTER — Encounter: Payer: Self-pay | Admitting: Family Medicine

## 2016-07-31 VITALS — BP 102/72 | HR 73 | Resp 12 | Ht 61.0 in | Wt 160.2 lb

## 2016-07-31 DIAGNOSIS — L03313 Cellulitis of chest wall: Secondary | ICD-10-CM

## 2016-07-31 MED ORDER — DOXYCYCLINE HYCLATE 100 MG PO TABS
100.0000 mg | ORAL_TABLET | Freq: Two times a day (BID) | ORAL | 0 refills | Status: AC
Start: 2016-07-31 — End: 2016-08-07

## 2016-07-31 MED ORDER — DOXYCYCLINE HYCLATE 100 MG PO TABS: 100.0000 mg | ORAL_TABLET | Freq: Two times a day (BID) | ORAL | 0 refills | Status: DC

## 2016-07-31 NOTE — Patient Instructions (Signed)
A few things to remember from today's visit:   Cellulitis of chest wall - Plan: doxycycline (VIBRA-TABS) 100 MG tablet  Follow with oncologists next week.  I will see you in 10-14 days.   Please be sure medication list is accurate. If a new problem present, please set up appointment sooner than planned today.

## 2016-07-31 NOTE — Progress Notes (Signed)
HPI:   ACUTE VISIT:  Chief Complaint  Patient presents with  . knot near port    [name] is a 61 y.o. female, who is here today complaining of "knot" beside port catheter.  She has history of peritoneal carcinomatosis and breast cancer, currently she is undergoing chemotherapy. She states that she has noted intermittent prominence around [place] cath, under right clavicle, for the past 3 months. According to patient, [name] was checked by oncologist during her last chemotherapy section, and she was reassured. Yesterday she started with new onset of moderate tenderness on same area and today she noted erythema. Pain is exacerbated by palpation.  She denies trauma on area.   Rash  This is a new problem. The current episode started today. The problem is unchanged. The affected locations include the torso. The rash is characterized by redness and swelling. She was exposed to nothing. Associated symptoms include fatigue (no more than usual). Pertinent negatives include no cough, fever, shortness of breath, sore throat or vomiting. Past treatments include nothing. Her past medical history is significant for allergies. (Malignant neoplasia,pancytopenia, and chemo among some.)    She has not noted changes, fever, worsening fatigue, odynophagia, or cervical pain. She has not tried any OTC treatment.   Review of Systems  Constitutional: Positive for fatigue (no more than usual). Negative for appetite change, chills and fever.  HENT: Negative for ear pain, facial swelling, mouth sores, sore throat, trouble swallowing and voice change.   Respiratory: Negative for cough, shortness of breath and wheezing.   Gastrointestinal: Negative for abdominal pain, nausea and vomiting.  Musculoskeletal: Negative for gait problem and neck pain.  Skin: Positive for rash. Negative for wound.  Neurological: Negative for syncope, weakness and headaches.  Psychiatric/Behavioral: Negative for  confusion. The patient is nervous/anxious.     Current Outpatient Prescriptions on File Prior to Visit  Medication Sig Dispense Refill  . acetaminophen (TYLENOL) 325 MG tablet Take 2 tablets (650 mg total) by mouth every 4 (four) hours as needed for mild pain, fever or headache. 30 tablet 0  . Acetylcarnitine HCl (ACETYL L-CARNITINE PO) Take 2,000 mg by mouth at bedtime.    . ALPRAZolam (XANAX) 0.5 MG tablet Take 0.5 mg by mouth 2 (two) times daily as needed for anxiety.    Marland Kitchen aspirin 81 MG chewable tablet Chew 1 tablet (81 mg total) by mouth daily. 30 tablet 0  . atorvastatin (LIPITOR) 40 MG tablet Take 1 tablet (40 mg total) by mouth daily at 6 PM. 90 tablet 3  . carvedilol (COREG) 3.125 MG tablet Take 1/2 tablet by mouth twice daily with meals. 30 tablet 9  . clotrimazole-betamethasone (LOTRISONE) cream Apply 1 application topically 2 (two) times daily. 30 g 1  . divalproex (DEPAKOTE) 250 MG DR tablet Take by mouth. Take 500mg s in the morning and 750mg s at night    . fluticasone (FLONASE) 50 MCG/ACT nasal spray INHALE 2 SPRAYS INTO EACH NOSTRIL EVERY NIGHT  11  . gabapentin (NEURONTIN) 100 MG capsule Take 1 capsule by mouth twice daily. 180 capsule 3  . lamoTRIgine (LAMICTAL) 100 MG tablet Take 100 mg by mouth 2 (two) times daily.    Marland Kitchen levothyroxine (SYNTHROID, LEVOTHROID) 100 MCG tablet Take 1 tablet (100 mcg total) by mouth daily before breakfast. 90 tablet 3  . lidocaine-prilocaine (EMLA) cream Apply topically as needed. Apply to port with every chemotherapy. 30 g 0  . loperamide (IMODIUM A-D) 2 MG tablet Take 2 mg by  mouth as needed for diarrhea or loose stools.    Marland Kitchen loratadine (CLARITIN) 10 MG tablet Take 10 mg by mouth daily as needed for allergies (with chemo).    . nitroGLYCERIN (NITROSTAT) 0.4 MG SL tablet Place 1 tablet (0.4 mg total) under the tongue every 5 (five) minutes as needed for chest pain. 25 tablet 3  . ondansetron (ZOFRAN) 8 MG tablet TAKE 1 TABLET BY MOUTH EVERY 8 HOURS  AS NEEDED FOR NAUSEA AND VOMITING 30 tablet 2  . pantoprazole (PROTONIX) 40 MG tablet Take 1 tablet (40 mg total) by mouth daily. 30 tablet 11  . prochlorperazine (COMPAZINE) 10 MG tablet Take 1 tablet (10 mg total) by mouth every 6 (six) hours as needed for nausea or vomiting. 30 tablet 0  . rOPINIRole (REQUIP) 0.5 MG tablet Take 3 tablets (1.5 mg total) by mouth at bedtime. 135 tablet 2  . sertraline (ZOLOFT) 50 MG tablet      Current Facility-Administered Medications on File Prior to Visit  Medication Dose Route Frequency Provider Last Rate Last Dose  . heparin lock flush 100 unit/mL  500 Units Intravenous Once Julie Portela, MD      . sodium chloride flush (NS) 0.9 % injection 10 mL  10 mL Intravenous PRN Julie Portela, MD         Past Medical History:  Diagnosis Date  . ADD (attention deficit disorder)   . Allergy    seasonal  . Anxiety   . Bipolar disorder ([place])   . Breast cancer ([place]) 1989   right breast  . Cancer ([place])    Omentum  . Coronary artery disease   . Depression   . Hematochezia   . History of echocardiogram    a. Limited Echo 9/17: EF 60-65%, no RWMA, Gr 1 DD, trivial AI, trivial MR, GLS -12% (likely underestimated), no pericardial effusion  . Hyperlipidemia   . Hypothyroidism   . Maintenance chemotherapy    Pt has chemo every 3 weeks (on Friday)  . NSTEMI (non-ST elevated myocardial infarction) ([place]) 10/26/2015  . Osteopenia 03/2009   t score -2.1 FRAX 4.6/0.4  . Premature menopause   . Sleep apnea    mild   Allergies  Allergen Reactions  . Doxil [Doxorubicin Hcl Liposomal] Anaphylaxis    1st Doxil.   . Pollen Extract Other (See Comments)    Pollen and grass causes a lot sneezing    Social History   Social History  . Marital status: Married    Spouse name: [name]  . Number of children: 0  . Years of education: 14   Occupational History  . unemployed [employer]   Social History Main Topics  . Smoking status: Never Smoker   . Smokeless tobacco: Never Used  . Alcohol use 0.0 oz/week     Comment: very rarely - maybe 1 glass wine every 3 mos  . Drug use: No  . Sexual activity: Not Currently    Birth control/ protection: Post-menopausal     Comment: 1st intercourse 10 yo-5 partners   Other Topics Concern  . None   Social History Narrative   Lives at home with husband.    Caffeine use:  Tea/soda occass    Vitals:   07/31/16 1610  BP: 102/72  Pulse: 73  Resp: 12  O2 sat at RA 94% Body mass index is 30.28 kg/m.   Physical Exam  Nursing note and vitals reviewed. Constitutional: She is oriented to person, place, and  time. She appears well-developed. No distress.  HENT:  Head: Atraumatic.  Mouth/Throat: Oropharynx is clear and moist and mucous membranes are normal.  Eyes: Conjunctivae are normal.  Neck: Neck supple. No muscular tenderness present. Edema (Edema that extends from right supraclavicular area to lateral cervical area, no tender, no crepitus appreciated upon palpation.) present.  Cardiovascular: Normal rate and regular rhythm.   No murmur heard.   Respiratory: Effort normal and breath sounds normal. No respiratory distress.  Lymphadenopathy:    She has cervical adenopathy.       Right cervical: Posterior cervical adenopathy present.       Left cervical: Posterior cervical adenopathy present.  Fullness supraventricular fossa R>>L. Left lymphadenopathy, no tender.  Neurological: She is alert and oriented to person, place, and time. She has normal strength. Gait normal.  Skin: Skin is warm. There is erythema.  Right under right clavicle there is a nodular like lesion, very tender, 2.5 cm, rounded, not fluctuant and some mobile. This lesion is surrounded by macular erythema (5-6 cm). No local heat or fluctuant area. See CV chest graphic.  Psychiatric: Her speech is normal. Her mood appears anxious.  Well groomed, good eye contact.     ASSESSMENT AND PLAN:   [name] was seen today for  knot near port.  Diagnoses and all orders for this visit:  Cellulitis of chest wall  Adenopathies palpated may be related to her Hx of breast cancer (S/P right mastectomy and left invasive ductal carcinoma). I am not sure if nodular lesion under clavicle is part of the port cath but because tenderness and surrounded erythema I recommend oral abx, she has tolerated Doxycycline well in the past. Clearly instructed about warning signs. Keep next appt with oncology, a week, and I will see her back in 10-14 days.  -     doxycycline (VIBRA-TABS) 100 MG tablet; Take 1 tablet (100 mg total) by mouth 2 (two) times daily.      Return in about 10 days (around 08/10/2016) for 10-14 days cellulitis.     -[name] was advised to seek immediate medical attention is symptoms suddenly get worse.      Geanna Divirgilio G. [name], MD  [facility] [place] office.

## 2016-08-01 ENCOUNTER — Encounter: Payer: Self-pay | Admitting: Family Medicine

## 2016-08-01 ENCOUNTER — Encounter: Payer: Self-pay | Admitting: Oncology

## 2016-08-06 DIAGNOSIS — Z79899 Other long term (current) drug therapy: Secondary | ICD-10-CM | POA: Diagnosis not present

## 2016-08-08 ENCOUNTER — Ambulatory Visit: Payer: PPO

## 2016-08-08 ENCOUNTER — Ambulatory Visit (HOSPITAL_BASED_OUTPATIENT_CLINIC_OR_DEPARTMENT_OTHER): Payer: PPO

## 2016-08-08 ENCOUNTER — Other Ambulatory Visit (HOSPITAL_BASED_OUTPATIENT_CLINIC_OR_DEPARTMENT_OTHER): Payer: PPO

## 2016-08-08 ENCOUNTER — Ambulatory Visit (HOSPITAL_BASED_OUTPATIENT_CLINIC_OR_DEPARTMENT_OTHER): Payer: PPO | Admitting: Oncology

## 2016-08-08 ENCOUNTER — Telehealth: Payer: Self-pay | Admitting: Oncology

## 2016-08-08 VITALS — BP 125/45 | HR 69 | Temp 98.0°F | Resp 20 | Ht 61.0 in | Wt 157.3 lb

## 2016-08-08 DIAGNOSIS — D696 Thrombocytopenia, unspecified: Secondary | ICD-10-CM

## 2016-08-08 DIAGNOSIS — C50412 Malignant neoplasm of upper-outer quadrant of left female breast: Secondary | ICD-10-CM | POA: Diagnosis not present

## 2016-08-08 DIAGNOSIS — C50919 Malignant neoplasm of unspecified site of unspecified female breast: Secondary | ICD-10-CM

## 2016-08-08 DIAGNOSIS — C482 Malignant neoplasm of peritoneum, unspecified: Secondary | ICD-10-CM

## 2016-08-08 DIAGNOSIS — C786 Secondary malignant neoplasm of retroperitoneum and peritoneum: Secondary | ICD-10-CM | POA: Diagnosis not present

## 2016-08-08 DIAGNOSIS — Z170 Estrogen receptor positive status [ER+]: Principal | ICD-10-CM

## 2016-08-08 DIAGNOSIS — Z5111 Encounter for antineoplastic chemotherapy: Secondary | ICD-10-CM | POA: Diagnosis not present

## 2016-08-08 DIAGNOSIS — I10 Essential (primary) hypertension: Secondary | ICD-10-CM

## 2016-08-08 DIAGNOSIS — Z95828 Presence of other vascular implants and grafts: Secondary | ICD-10-CM

## 2016-08-08 LAB — CBC WITH DIFFERENTIAL/PLATELET
BASO%: 0.5 % (ref 0.0–2.0)
BASOS ABS: 0 10*3/uL (ref 0.0–0.1)
EOS ABS: 0 10*3/uL (ref 0.0–0.5)
EOS%: 1.3 % (ref 0.0–7.0)
HCT: 34.4 % — ABNORMAL LOW (ref 34.8–46.6)
HEMOGLOBIN: 11 g/dL — AB (ref 11.6–15.9)
LYMPH%: 44.8 % (ref 14.0–49.7)
MCH: 30.9 pg (ref 25.1–34.0)
MCHC: 32 g/dL (ref 31.5–36.0)
MCV: 96.5 fL (ref 79.5–101.0)
MONO#: 0.5 10*3/uL (ref 0.1–0.9)
MONO%: 13.5 % (ref 0.0–14.0)
NEUT#: 1.4 10*3/uL — ABNORMAL LOW (ref 1.5–6.5)
NEUT%: 39.9 % (ref 38.4–76.8)
Platelets: 91 10*3/uL — ABNORMAL LOW (ref 145–400)
RBC: 3.57 10*6/uL — ABNORMAL LOW (ref 3.70–5.45)
RDW: 14.5 % (ref 11.2–14.5)
WBC: 3.4 10*3/uL — ABNORMAL LOW (ref 3.9–10.3)
lymph#: 1.5 10*3/uL (ref 0.9–3.3)

## 2016-08-08 LAB — COMPREHENSIVE METABOLIC PANEL
ALBUMIN: 3.4 g/dL — AB (ref 3.5–5.0)
ALK PHOS: 80 U/L (ref 40–150)
ALT: 8 U/L (ref 0–55)
ANION GAP: 7 meq/L (ref 3–11)
AST: 14 U/L (ref 5–34)
BUN: 13.4 mg/dL (ref 7.0–26.0)
CALCIUM: 9.3 mg/dL (ref 8.4–10.4)
CO2: 30 mEq/L — ABNORMAL HIGH (ref 22–29)
Chloride: 103 mEq/L (ref 98–109)
Creatinine: 0.7 mg/dL (ref 0.6–1.1)
GLUCOSE: 87 mg/dL (ref 70–140)
POTASSIUM: 4.4 meq/L (ref 3.5–5.1)
SODIUM: 141 meq/L (ref 136–145)
Total Bilirubin: 0.37 mg/dL (ref 0.20–1.20)
Total Protein: 7 g/dL (ref 6.4–8.3)

## 2016-08-08 MED ORDER — SODIUM CHLORIDE 0.9 % IV SOLN
Freq: Once | INTRAVENOUS | Status: AC
  Administered 2016-08-08: 11:00:00 via INTRAVENOUS

## 2016-08-08 MED ORDER — CARBOPLATIN CHEMO INTRADERMAL TEST DOSE 100MCG/0.02ML
100.0000 ug | Freq: Once | INTRADERMAL | Status: AC
  Administered 2016-08-08: 100 ug via INTRADERMAL
  Filled 2016-08-08: qty 0.01

## 2016-08-08 MED ORDER — SODIUM CHLORIDE 0.9% FLUSH
10.0000 mL | INTRAVENOUS | Status: DC | PRN
  Administered 2016-08-08: 10 mL
  Filled 2016-08-08: qty 10

## 2016-08-08 MED ORDER — HEPARIN SOD (PORK) LOCK FLUSH 100 UNIT/ML IV SOLN
500.0000 [IU] | Freq: Once | INTRAVENOUS | Status: AC | PRN
  Administered 2016-08-08: 500 [IU]
  Filled 2016-08-08: qty 5

## 2016-08-08 MED ORDER — DEXAMETHASONE SODIUM PHOSPHATE 10 MG/ML IJ SOLN
INTRAMUSCULAR | Status: AC
  Filled 2016-08-08: qty 1

## 2016-08-08 MED ORDER — SODIUM CHLORIDE 0.9 % IV SOLN
570.0000 mg | Freq: Once | INTRAVENOUS | Status: AC
  Administered 2016-08-08: 570 mg via INTRAVENOUS
  Filled 2016-08-08: qty 57

## 2016-08-08 MED ORDER — DEXAMETHASONE SODIUM PHOSPHATE 10 MG/ML IJ SOLN
4.0000 mg | Freq: Once | INTRAMUSCULAR | Status: AC
  Administered 2016-08-08: 4 mg via INTRAVENOUS

## 2016-08-08 MED ORDER — PALONOSETRON HCL INJECTION 0.25 MG/5ML
INTRAVENOUS | Status: AC
Start: 2016-08-08 — End: 2016-08-08
  Filled 2016-08-08: qty 5

## 2016-08-08 MED ORDER — PALONOSETRON HCL INJECTION 0.25 MG/5ML
0.2500 mg | Freq: Once | INTRAVENOUS | Status: AC
  Administered 2016-08-08: 0.25 mg via INTRAVENOUS

## 2016-08-08 MED ORDER — SODIUM CHLORIDE 0.9 % IJ SOLN
10.0000 mL | INTRAMUSCULAR | Status: DC | PRN
  Administered 2016-08-08: 10 mL via INTRAVENOUS
  Filled 2016-08-08: qty 10

## 2016-08-08 NOTE — Patient Instructions (Signed)

## 2016-08-08 NOTE — Progress Notes (Signed)
Hematology and Oncology Follow Up Visit  [name] [id number] 1955/12/12 61 y.o. 08/08/2016 10:12 AM    Principle Diagnosis: 61 year old woman diagnosed with:  1. 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 This was diagnosed in July of 2014. 2. 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 The tumor is ER positive PR negative, HER-2 negative.   Prior Therapy:  She is status post paracentesis performed on 09/17/2012 with the cytology confirmed the presence of adenocarcinoma and immunohistochemical stains suggest GYN etiology. She is also status post lumpectomy for breast cancer diagnosed 25 years ago followed by radiation and chemotherapy under the care of Dr. Sonny Dandy. She did not receive any hormonal therapy. Chemotherapy utilizing carboplatin and Taxotere started on 10/01/2012. Avastin was added with cycle 4. This was discontinued in June of 2015. She is S/P Avastin maintenance only between June 2015 till March 2017. Therapy discontinued due to progression of disease. She is status post Doxil salvage chemotherapy therapy discontinued because of hypersensitivity reaction in March 2017.  Current therapy:  Carboplatin and AUC of 5 every 3 weeks cycle 1 started on 07/08/2015. She is here for her next cycle of therapy.  Interim History: [name] presents today for a followup visit with her husband. Since her last visit, she was on a brief treatment break based on her request. She felt well off treatment and recovered at this time and ready to proceed with her next cycle of therapy. Her overall quality of life and performance status remains reasonable. She did develop a cellulitis of the chest wall after she was noted some erythema close to the Port-A-Cath. She was started on doxycycline and this has nearly resolved.  She tolerated her last chemotherapy 6 weeks  ago without any complications. She denied any nausea, vomiting or bleeding. She does not report any abdominal pain or early satiety. Her appetite is excellent. She did lose a few pounds after she was started on doxycycline related to GI issues from the antibiotics.  She does not report any headaches, blurry vision, syncope or seizures. She denied any fever chills. She does report some chest discomfort but no dyspnea on exertion. Has not reported any shortness of breath or cough. She denied any pain with swallowing. She does not report any vomiting, constipation does report some occasional diarrhea. She denied any GYN bleeding. She reports no anxiety or depression. Remainder of the review of systems is unremarkable.  Medications: I have reviewed the patient's current medications.  Current Outpatient Prescriptions  Medication Sig Dispense Refill  . acetaminophen (TYLENOL) 325 MG tablet Take 2 tablets (650 mg total) by mouth every 4 (four) hours as needed for mild pain, fever or headache. 30 tablet 0  . Acetylcarnitine HCl (ACETYL L-CARNITINE PO) Take 2,000 mg by mouth at bedtime.    . ALPRAZolam (XANAX) 0.5 MG tablet Take 0.5 mg by mouth 2 (two) times daily as needed for anxiety.    Marland Kitchen aspirin 81 MG chewable tablet Chew 1 tablet (81 mg total) by mouth daily. 30 tablet 0  . atorvastatin (LIPITOR) 40 MG tablet Take 1 tablet (40 mg total) by mouth daily at 6 PM. 90 tablet 3  . carvedilol (COREG) 3.125 MG tablet Take 1/2 tablet by mouth twice daily with meals. 30 tablet 9  . clotrimazole-betamethasone (LOTRISONE) cream Apply 1 application topically 2 (two) times daily. 30 g 1  . divalproex (  DEPAKOTE) 250 MG DR tablet Take by mouth. Take '500mg'$ s in the morning and '750mg'$ s at night    . fluticasone (FLONASE) 50 MCG/ACT nasal spray INHALE 2 SPRAYS INTO EACH NOSTRIL EVERY NIGHT  11  . gabapentin (NEURONTIN) 100 MG capsule Take 1 capsule by mouth twice daily. 180 capsule 3  . lamoTRIgine (LAMICTAL) 100 MG tablet  Take 100 mg by mouth 2 (two) times daily.    Marland Kitchen levothyroxine (SYNTHROID, LEVOTHROID) 100 MCG tablet Take 1 tablet (100 mcg total) by mouth daily before breakfast. 90 tablet 3  . lidocaine-prilocaine (EMLA) cream Apply topically as needed. Apply to port with every chemotherapy. 30 g 0  . loperamide (IMODIUM A-D) 2 MG tablet Take 2 mg by mouth as needed for diarrhea or loose stools.    Marland Kitchen loratadine (CLARITIN) 10 MG tablet Take 10 mg by mouth daily as needed for allergies (with chemo).    . nitroGLYCERIN (NITROSTAT) 0.4 MG SL tablet Place 1 tablet (0.4 mg total) under the tongue every 5 (five) minutes as needed for chest pain. 25 tablet 3  . ondansetron (ZOFRAN) 8 MG tablet TAKE 1 TABLET BY MOUTH EVERY 8 HOURS AS NEEDED FOR NAUSEA AND VOMITING 30 tablet 2  . pantoprazole (PROTONIX) 40 MG tablet Take 1 tablet (40 mg total) by mouth daily. 30 tablet 11  . prochlorperazine (COMPAZINE) 10 MG tablet Take 1 tablet (10 mg total) by mouth every 6 (six) hours as needed for nausea or vomiting. 30 tablet 0  . rOPINIRole (REQUIP) 0.5 MG tablet Take 3 tablets (1.5 mg total) by mouth at bedtime. 135 tablet 2  . sertraline (ZOLOFT) 50 MG tablet      No current facility-administered medications for this visit.    Facility-Administered Medications Ordered in Other Visits  Medication Dose Route Frequency Provider Last Rate Last Dose  . heparin lock flush 100 unit/mL  500 Units Intravenous Once Wyatt Portela, MD      . sodium chloride flush (NS) 0.9 % injection 10 mL  10 mL Intravenous PRN Wyatt Portela, MD         Allergies:  Allergies  Allergen Reactions  . Doxil [Doxorubicin Hcl Liposomal] Anaphylaxis    1st Doxil.   . Pollen Extract Other (See Comments)    Pollen and grass causes a lot sneezing    Past Medical History, Surgical history, Social history, and Family History were reviewed and updated.   Physical Exam: Blood pressure (!) 125/45, pulse 69, temperature 98 F (36.7 C), temperature  source Oral, resp. rate 20, height '5\' 1"'$  (1.549 m), weight 157 lb 4.8 oz (71.4 kg), SpO2 97 %.   ECOG: 1 General appearance: Alert, awake woman without distress. Head: Normocephalic, without obvious abnormality. No oral ulcers or thrush. Neck: no adenopathy or masses. Lymph nodes: Cervical, supraclavicular, and axillary nodes normal. Heart:regular rate and rhythm, S1, S2 normal, no murmur, click, rub or gallop Lung:chest clear, no wheezing, rales, normal symmetric. Chest wall examination:  Port-A-Cath site without any abnormalities. I could not appreciate any erythema or masses. Abdomin: Soft, nontender with good bowel sounds. No rebound or guarding. EXT:no erythema, induration, or nodules. No edema. Skin: No rashes or lesions. No ecchymosis.  Lab Results: Lab Results  Component Value Date   WBC 3.4 (L) 08/08/2016   HGB 11.0 (L) 08/08/2016   HCT 34.4 (L) 08/08/2016   MCV 96.5 08/08/2016   PLT 91 (L) 08/08/2016     Chemistry      Component Value Date/Time  NA 141 08/08/2016 0849   K 4.4 08/08/2016 0849   CL 104 04/16/2016 0806   CO2 30 (H) 08/08/2016 0849   BUN 13.4 08/08/2016 0849   CREATININE 0.7 08/08/2016 0849      Component Value Date/Time   CALCIUM 9.3 08/08/2016 0849   ALKPHOS 80 08/08/2016 0849   AST 14 08/08/2016 0849   ALT 8 08/08/2016 0849   BILITOT 0.37 08/08/2016 0849     Results for ODESSIE, POLZIN (MRN [id number]) as of 08/08/2016 09:39  Ref. Range 05/16/2016 09:29 06/06/2016 08:45 06/27/2016 09:04  Cancer Antigen (CA) 125 Latest Ref Range: 0.0 - 38.1 U/mL 24.7 24.6 16.1    Impression and Plan:  61 year old woman with the following issues:  1. Peritoneal carcinomatosis with omental involvement. The pathology confirmed the presence of adenocarcinoma of likely GYN etiology. Her CA 125 was elevated at 333.5 on 09/19/2012. She is status post systemic chemotherapy with an excellent response utilizing carboplatin and Taxotere and a Avastin . This therapy was  held in June of 2015 and currently on Avastin maintenance.   CT scan obtained on 05/05/2015 as well as her tumor markers showed clear progression. Her CA-125 is up to 189 and her CT scan showed increase in her peritoneal disease.   She is currently receiving salvage therapy with carboplatin single agent at AUC of 5 With excellent response.  CT scan obtained on 06/25/2016 Continues to show stable disease. Her CA-125 continues to decline indicating positive response to therapy. This number was down to 16 on 06/27/2016. I anticipate slight increase in this number on 08/08/2016 because of the treatment break.  Risks and benefits of continuing carboplatin therapy was discussed with the patient and her husband. She is agreeable to continue every 3 weeks therapy and we will repeat a CT scan in 3 months.  2. IV access: Port-A-Cath is without complications. She did develop skin cellulitis and currently being treated with doxycycline. This has resolved on today's examination.  3. Thrombocytopenia: Her platelets remains relatively stable without bleeding. She will receive treatment as long as her platelet count above 50,000.  4. Abdominal distention: No ascites noted on ultrasound unlikely related to peritoneal carcinomatosis. No GI symptoms noted.  5. Left breast mass: She status post biopsy with the pathology indicating T2 N1 disease with the tumor have ER positive, PR negative HER-2 negative. Her Ki-67 is 50%.  Mammogram obtained on 03/20/2016 showed excellent response to systemic chemotherapy. No primary surgical therapy is indicated at this time. We will continue to monitor. Given her ovarian cancer, her breast cancer remains a minor issue for the time being.  8. Neutropenia: Neulasta will be given after a cycles of therapy. She will receive chemotherapy regardless to her [place].  9. Chest wall cellulitis: She did have a Recurrence around the Port-A-Cath Site Which Has Resolved at This Time and  Currently on Doxycycline.  10. Nausea: , And Compazine well managed.  11. Hypertension: Blood pressure is adequately controlled at this time.   12. The followup: She will follow-up in 3 weeks for chemotherapy infusion. She will have an M.D. follow-up in 6 weeks with chemotherapy as well.    Zola Button, MD 6/20/201810:12 AM

## 2016-08-08 NOTE — Patient Instructions (Signed)
[facility] Discharge Instructions for Patients Receiving Chemotherapy  Today you received the following chemotherapy agents: Carboplatin   To help prevent nausea and vomiting after your treatment, we encourage you to take your nausea medication as directed.    If you develop nausea and vomiting that is not controlled by your nausea medication, call the clinic.   BELOW ARE SYMPTOMS THAT SHOULD BE REPORTED IMMEDIATELY:  *FEVER GREATER THAN 100.5 F  *CHILLS WITH OR WITHOUT FEVER  NAUSEA AND VOMITING THAT IS NOT CONTROLLED WITH YOUR NAUSEA MEDICATION  *UNUSUAL SHORTNESS OF BREATH  *UNUSUAL BRUISING OR BLEEDING  TENDERNESS IN MOUTH AND THROAT WITH OR WITHOUT PRESENCE OF ULCERS  *URINARY PROBLEMS  *BOWEL PROBLEMS  UNUSUAL RASH Items with * indicate a potential emergency and should be followed up as soon as possible.  Feel free to call the clinic you have any questions or concerns. The clinic phone number is [phone].  Please show the CHEMO ALERT CARD at check-in to the Emergency Department and triage nurse.   

## 2016-08-08 NOTE — Progress Notes (Signed)
Per Dr. Hazeline Junker note, okay to treat with ANC of 1.4 and platelets of 91.

## 2016-08-08 NOTE — Telephone Encounter (Signed)
Appointments complete per 6/20 los. Discussed with patients appointments for June thru August. Patient declined print out.

## 2016-08-09 ENCOUNTER — Ambulatory Visit (HOSPITAL_BASED_OUTPATIENT_CLINIC_OR_DEPARTMENT_OTHER): Payer: PPO

## 2016-08-09 VITALS — BP 121/63 | HR 72 | Temp 98.2°F | Resp 18

## 2016-08-09 DIAGNOSIS — Z5189 Encounter for other specified aftercare: Secondary | ICD-10-CM

## 2016-08-09 DIAGNOSIS — C482 Malignant neoplasm of peritoneum, unspecified: Secondary | ICD-10-CM | POA: Diagnosis not present

## 2016-08-09 LAB — CA 125: Cancer Antigen (CA) 125: 24.2 U/mL (ref 0.0–38.1)

## 2016-08-09 MED ORDER — PEGFILGRASTIM INJECTION 6 MG/0.6ML ~~LOC~~
6.0000 mg | PREFILLED_SYRINGE | Freq: Once | SUBCUTANEOUS | Status: AC
  Administered 2016-08-09: 6 mg via SUBCUTANEOUS
  Filled 2016-08-09: qty 0.6

## 2016-08-10 ENCOUNTER — Ambulatory Visit (INDEPENDENT_AMBULATORY_CARE_PROVIDER_SITE_OTHER): Payer: PPO | Admitting: Family Medicine

## 2016-08-10 ENCOUNTER — Encounter: Payer: Self-pay | Admitting: Family Medicine

## 2016-08-10 VITALS — BP 118/70 | HR 98 | Resp 12 | Ht 61.0 in | Wt 157.2 lb

## 2016-08-10 DIAGNOSIS — L03313 Cellulitis of chest wall: Secondary | ICD-10-CM

## 2016-08-10 DIAGNOSIS — B354 Tinea corporis: Secondary | ICD-10-CM

## 2016-08-10 NOTE — Progress Notes (Signed)
HPI:   [name] is a 61 y.o. female, who is here today to follow on recent OV.   She was seen on 07/09/16. She completed Doxycycline, which caused mild nausea.  She is reporting resolution of erythema and tenderness around her port, right upper chest wall.   On 08/08/2016 she received chemotherapy treatment through her port. Yesterday she received a "immunity" shot, which usually causes nausea, myalgias, and fatigue.   She is feeling nauseated and "flu" like symptoms after chemo, not different than prior ones.   She was also inquiring about any contraindication for her to start aquatic exercises. For a few years she has had a mildly pruritic rash on right thigh, she is using OTC Clotrimazole. Treatment helps temporarily, rash is not tender,no local heat.   Review of Systems  Constitutional: Positive for appetite change and fatigue. Negative for chills and fever.  HENT: Negative for mouth sores, sore throat and trouble swallowing.   Eyes: Negative for discharge and redness.  Respiratory: Negative for shortness of breath and wheezing.   Cardiovascular: Negative for leg swelling.  Gastrointestinal: Positive for nausea. Negative for abdominal pain, diarrhea and vomiting.  Musculoskeletal: Positive for myalgias.  Skin: Positive for rash. Negative for wound.  Allergic/Immunologic: Positive for environmental allergies.  Neurological: Negative for syncope, weakness and headaches.  Psychiatric/Behavioral: Negative for confusion. The patient is nervous/anxious.       Current Outpatient Prescriptions on File Prior to Visit  Medication Sig Dispense Refill  . acetaminophen (TYLENOL) 325 MG tablet Take 2 tablets (650 mg total) by mouth every 4 (four) hours as needed for mild pain, fever or headache. 30 tablet 0  . Acetylcarnitine HCl (ACETYL L-CARNITINE PO) Take 2,000 mg by mouth at bedtime.    . ALPRAZolam (XANAX) 0.5 MG tablet Take 0.5 mg by mouth 2 (two) times daily as  needed for anxiety.    Marland Kitchen aspirin 81 MG chewable tablet Chew 1 tablet (81 mg total) by mouth daily. 30 tablet 0  . atorvastatin (LIPITOR) 40 MG tablet Take 1 tablet (40 mg total) by mouth daily at 6 PM. 90 tablet 3  . carvedilol (COREG) 3.125 MG tablet Take 1/2 tablet by mouth twice daily with meals. 30 tablet 9  . clotrimazole-betamethasone (LOTRISONE) cream Apply 1 application topically 2 (two) times daily. 30 g 1  . divalproex (DEPAKOTE) 250 MG DR tablet Take by mouth. Take 500mg s in the morning and 750mg s at night    . fluticasone (FLONASE) 50 MCG/ACT nasal spray INHALE 2 SPRAYS INTO EACH NOSTRIL EVERY NIGHT  11  . gabapentin (NEURONTIN) 100 MG capsule Take 1 capsule by mouth twice daily. 180 capsule 3  . lamoTRIgine (LAMICTAL) 100 MG tablet Take 100 mg by mouth 2 (two) times daily.    Marland Kitchen levothyroxine (SYNTHROID, LEVOTHROID) 100 MCG tablet Take 1 tablet (100 mcg total) by mouth daily before breakfast. 90 tablet 3  . lidocaine-prilocaine (EMLA) cream Apply topically as needed. Apply to port with every chemotherapy. 30 g 0  . loperamide (IMODIUM A-D) 2 MG tablet Take 2 mg by mouth as needed for diarrhea or loose stools.    Marland Kitchen loratadine (CLARITIN) 10 MG tablet Take 10 mg by mouth daily as needed for allergies (with chemo).    . nitroGLYCERIN (NITROSTAT) 0.4 MG SL tablet Place 1 tablet (0.4 mg total) under the tongue every 5 (five) minutes as needed for chest pain. 25 tablet 3  . ondansetron (ZOFRAN) 8 MG tablet TAKE 1 TABLET  BY MOUTH EVERY 8 HOURS AS NEEDED FOR NAUSEA AND VOMITING 30 tablet 2  . pantoprazole (PROTONIX) 40 MG tablet Take 1 tablet (40 mg total) by mouth daily. 30 tablet 11  . prochlorperazine (COMPAZINE) 10 MG tablet Take 1 tablet (10 mg total) by mouth every 6 (six) hours as needed for nausea or vomiting. 30 tablet 0  . rOPINIRole (REQUIP) 0.5 MG tablet Take 3 tablets (1.5 mg total) by mouth at bedtime. 135 tablet 2  . sertraline (ZOLOFT) 50 MG tablet      Current  Facility-Administered Medications on File Prior to Visit  Medication Dose Route Frequency Provider Last Rate Last Dose  . heparin lock flush 100 unit/mL  500 Units Intravenous Once Wyatt Portela, MD      . sodium chloride flush (NS) 0.9 % injection 10 mL  10 mL Intravenous PRN Wyatt Portela, MD         Past Medical History:  Diagnosis Date  . ADD (attention deficit disorder)   . Allergy    seasonal  . Anxiety   . Bipolar disorder ([place])   . Breast cancer ([place]) 1989   right breast  . Cancer ([place])    Omentum  . Coronary artery disease   . Depression   . Hematochezia   . History of echocardiogram    a. Limited Echo 9/17: EF 60-65%, no RWMA, Gr 1 DD, trivial AI, trivial MR, GLS -12% (likely underestimated), no pericardial effusion  . Hyperlipidemia   . Hypothyroidism   . Maintenance chemotherapy    Pt has chemo every 3 weeks (on Friday)  . NSTEMI (non-ST elevated myocardial infarction) ([place]) 10/26/2015  . Osteopenia 03/2009   t score -2.1 FRAX 4.6/0.4  . Premature menopause   . Sleep apnea    mild   Allergies  Allergen Reactions  . Doxil [Doxorubicin Hcl Liposomal] Anaphylaxis    1st Doxil.   . Pollen Extract Other (See Comments)    Pollen and grass causes a lot sneezing    Social History   Social History  . Marital status: Married    Spouse name: [name]  . Number of children: 0  . Years of education: 14   Occupational History  . unemployed [employer]   Social History Main Topics  . Smoking status: Never Smoker  . Smokeless tobacco: Never Used  . Alcohol use 0.0 oz/week     Comment: very rarely - maybe 1 glass wine every 3 mos  . Drug use: No  . Sexual activity: Not Currently    Birth control/ protection: Post-menopausal     Comment: 1st intercourse 71 yo-5 partners   Other Topics Concern  . None   Social History Narrative   Lives at home with husband.    Caffeine use:  Tea/soda occass    Vitals:   08/10/16 1440  BP: 118/70    Pulse: 98  Resp: 12   Body mass index is 29.71 kg/m.  Physical Exam  Nursing note and vitals reviewed. Constitutional: She is oriented to person, place, and time. She appears well-developed. No distress.  HENT:  Head: Atraumatic.  Mouth/Throat: Oropharynx is clear and moist and mucous membranes are normal.  Eyes: Conjunctivae are normal.  Respiratory: Effort normal and breath sounds normal. No respiratory distress.  Musculoskeletal: She exhibits no edema.       Legs: Neurological: She is alert and oriented to person, place, and time. Gait normal.  Skin: Skin is warm. Rash noted. Rash is  macular.  Right upper chest wall, around port cath is not longer erythematous or tender. Right inner upper thigh with macular, mildly erythematous,clear center, scaly lesion. No tender, no local heat.  Psychiatric: She has a normal mood and affect. Her speech is normal.  Well groomed, good eye contact.      ASSESSMENT AND PLAN:   Ms Leslieanne was seen today for follow-up.  Diagnoses and all orders for this visit:  Cellulitis of chest wall  Resolved.  Tinea corporis  Ongoing chemo and immunodepressed states makes her prone to this type of infections. It has been stable, intermittently. Continue topical treatment with Clotrimazole. OTC Nizoral shampoo x 5 min may also help as well as Selsun Blue shampoo. She can participate in aquatic exercises but recommended showering before and after going into the pool. Instructed about warning signs.     Farah Lepak G. [name], MD  [facility]. [place] office.

## 2016-08-10 NOTE — Patient Instructions (Signed)
A few things to remember from today's visit:   Tinea corporis  Cellulitis of chest wall Nizoral shampoo may also help. Selsun Blue is another option.  Cellulitis resolved.    Please be sure medication list is accurate. If a new problem present, please set up appointment sooner than planned today.

## 2016-08-13 DIAGNOSIS — Z853 Personal history of malignant neoplasm of breast: Secondary | ICD-10-CM | POA: Diagnosis not present

## 2016-08-13 DIAGNOSIS — C50911 Malignant neoplasm of unspecified site of right female breast: Secondary | ICD-10-CM | POA: Diagnosis not present

## 2016-08-14 ENCOUNTER — Telehealth: Payer: Self-pay | Admitting: *Deleted

## 2016-08-14 NOTE — Telephone Encounter (Signed)
Faxed request for partial mastectomy prosthesis to [facility]

## 2016-08-17 DIAGNOSIS — Z853 Personal history of malignant neoplasm of breast: Secondary | ICD-10-CM | POA: Diagnosis not present

## 2016-08-17 DIAGNOSIS — C50911 Malignant neoplasm of unspecified site of right female breast: Secondary | ICD-10-CM | POA: Diagnosis not present

## 2016-08-29 ENCOUNTER — Encounter: Payer: Self-pay | Admitting: Oncology

## 2016-08-29 ENCOUNTER — Ambulatory Visit: Payer: PPO

## 2016-08-29 ENCOUNTER — Ambulatory Visit (HOSPITAL_BASED_OUTPATIENT_CLINIC_OR_DEPARTMENT_OTHER): Payer: PPO

## 2016-08-29 ENCOUNTER — Other Ambulatory Visit (HOSPITAL_BASED_OUTPATIENT_CLINIC_OR_DEPARTMENT_OTHER): Payer: PPO

## 2016-08-29 ENCOUNTER — Ambulatory Visit (HOSPITAL_BASED_OUTPATIENT_CLINIC_OR_DEPARTMENT_OTHER): Payer: PPO | Admitting: Oncology

## 2016-08-29 VITALS — BP 130/67 | HR 57 | Temp 98.7°F | Resp 18 | Ht 61.0 in | Wt 157.1 lb

## 2016-08-29 DIAGNOSIS — Z5111 Encounter for antineoplastic chemotherapy: Secondary | ICD-10-CM | POA: Diagnosis not present

## 2016-08-29 DIAGNOSIS — Z95828 Presence of other vascular implants and grafts: Secondary | ICD-10-CM

## 2016-08-29 DIAGNOSIS — C482 Malignant neoplasm of peritoneum, unspecified: Secondary | ICD-10-CM

## 2016-08-29 DIAGNOSIS — D696 Thrombocytopenia, unspecified: Secondary | ICD-10-CM

## 2016-08-29 DIAGNOSIS — C50412 Malignant neoplasm of upper-outer quadrant of left female breast: Secondary | ICD-10-CM

## 2016-08-29 DIAGNOSIS — Z170 Estrogen receptor positive status [ER+]: Secondary | ICD-10-CM

## 2016-08-29 DIAGNOSIS — C50919 Malignant neoplasm of unspecified site of unspecified female breast: Secondary | ICD-10-CM

## 2016-08-29 LAB — COMPREHENSIVE METABOLIC PANEL
ALBUMIN: 3.4 g/dL — AB (ref 3.5–5.0)
ALK PHOS: 82 U/L (ref 40–150)
ALT: 9 U/L (ref 0–55)
AST: 14 U/L (ref 5–34)
Anion Gap: 8 mEq/L (ref 3–11)
BILIRUBIN TOTAL: 0.24 mg/dL (ref 0.20–1.20)
BUN: 15.8 mg/dL (ref 7.0–26.0)
CO2: 29 meq/L (ref 22–29)
Calcium: 9.3 mg/dL (ref 8.4–10.4)
Chloride: 106 mEq/L (ref 98–109)
Creatinine: 0.7 mg/dL (ref 0.6–1.1)
EGFR: >90 mL/min/{1.73_m2} (ref >90)
GLUCOSE: 99 mg/dL (ref 70–140)
Potassium: 4.5 mEq/L (ref 3.5–5.1)
SODIUM: 142 meq/L (ref 136–145)
TOTAL PROTEIN: 6.8 g/dL (ref 6.4–8.3)

## 2016-08-29 LAB — CBC WITH DIFFERENTIAL/PLATELET
BASO%: 0 % (ref 0.0–2.0)
Basophils Absolute: 0 10*3/uL (ref 0.0–0.1)
EOS ABS: 0.1 10*3/uL (ref 0.0–0.5)
EOS%: 2.1 % (ref 0.0–7.0)
HCT: 33.3 % — ABNORMAL LOW (ref 34.8–46.6)
HEMOGLOBIN: 10 g/dL — AB (ref 11.6–15.9)
LYMPH%: 43.6 % (ref 14.0–49.7)
MCH: 30.4 pg (ref 25.1–34.0)
MCHC: 30 g/dL — ABNORMAL LOW (ref 31.5–36.0)
MCV: 101.2 fL — ABNORMAL HIGH (ref 79.5–101.0)
MONO#: 0.3 10*3/uL (ref 0.1–0.9)
MONO%: 10.7 % (ref 0.0–14.0)
NEUT%: 43.6 % (ref 38.4–76.8)
NEUTROS ABS: 1.2 10*3/uL — AB (ref 1.5–6.5)
Platelets: 52 10*3/uL — ABNORMAL LOW (ref 145–400)
RBC: 3.29 10*6/uL — AB (ref 3.70–5.45)
RDW: 14.7 % — AB (ref 11.2–14.5)
WBC: 2.8 10*3/uL — AB (ref 3.9–10.3)
lymph#: 1.2 10*3/uL (ref 0.9–3.3)

## 2016-08-29 MED ORDER — SODIUM CHLORIDE 0.9 % IJ SOLN
10.0000 mL | INTRAMUSCULAR | Status: DC | PRN
  Administered 2016-08-29: 10 mL via INTRAVENOUS
  Filled 2016-08-29: qty 10

## 2016-08-29 MED ORDER — CARBOPLATIN CHEMO INTRADERMAL TEST DOSE 100MCG/0.02ML
100.0000 ug | Freq: Once | INTRADERMAL | Status: AC
  Administered 2016-08-29: 100 ug via INTRADERMAL
  Filled 2016-08-29: qty 0.02

## 2016-08-29 MED ORDER — ONDANSETRON 8 MG PO TBDP: 8.0000 mg | ORAL_TABLET | Freq: Three times a day (TID) | ORAL | 1 refills | Status: DC | PRN

## 2016-08-29 MED ORDER — DEXAMETHASONE SODIUM PHOSPHATE 10 MG/ML IJ SOLN
4.0000 mg | Freq: Once | INTRAMUSCULAR | Status: AC
  Administered 2016-08-29: 4 mg via INTRAVENOUS

## 2016-08-29 MED ORDER — SODIUM CHLORIDE 0.9 % IV SOLN
Freq: Once | INTRAVENOUS | Status: AC
Start: 2016-08-29 — End: 2016-08-29
  Administered 2016-08-29: 13:00:00 via INTRAVENOUS

## 2016-08-29 MED ORDER — CARBOPLATIN CHEMO INJECTION 600 MG/60ML
570.0000 mg | Freq: Once | INTRAVENOUS | Status: AC
  Administered 2016-08-29: 570 mg via INTRAVENOUS
  Filled 2016-08-29: qty 57

## 2016-08-29 MED ORDER — DEXAMETHASONE SODIUM PHOSPHATE 10 MG/ML IJ SOLN
INTRAMUSCULAR | Status: AC
  Filled 2016-08-29: qty 1

## 2016-08-29 MED ORDER — PALONOSETRON HCL INJECTION 0.25 MG/5ML
0.2500 mg | Freq: Once | INTRAVENOUS | Status: AC
  Administered 2016-08-29: 0.25 mg via INTRAVENOUS

## 2016-08-29 MED ORDER — SODIUM CHLORIDE 0.9% FLUSH
10.0000 mL | INTRAVENOUS | Status: DC | PRN
  Administered 2016-08-29: 10 mL
  Filled 2016-08-29: qty 10

## 2016-08-29 MED ORDER — PALONOSETRON HCL INJECTION 0.25 MG/5ML
INTRAVENOUS | Status: AC
  Filled 2016-08-29: qty 5

## 2016-08-29 MED ORDER — HEPARIN SOD (PORK) LOCK FLUSH 100 UNIT/ML IV SOLN
500.0000 [IU] | Freq: Once | INTRAVENOUS | Status: AC | PRN
  Administered 2016-08-29: 500 [IU]
  Filled 2016-08-29: qty 5

## 2016-08-29 NOTE — Progress Notes (Signed)
Hematology and Oncology Follow Up Visit  [name] [id number] 1955/03/19 61 y.o. 08/29/2016 10:33 AM [name], [name], MDJordan, Malka So, MD   Principle Diagnosis: T66-year-old female diagnosed with:  1. 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 Diagnosed in July 2014. 2. Invasive ductal carcinoma, grade 3 of the left breast with positive sentinel lymph node biopsy diagnosed on 07/07/2015. The tumor is ER positive, PR negative, HER-2/neu negative.   Prior Therapy: She is status post paracentesis performed on 09/17/2012 with the cytology confirmed the presence of adenocarcinoma and immunohistochemical stains suggest GYN etiology. She is also status post lumpectomy for breast cancer diagnosed 25 years ago followed by radiation and chemotherapy under the care of Dr. Sonny Dandy. She did not receive any hormonal therapy. Chemotherapy utilizing carboplatin and Taxotere started on 10/01/2012. Avastin was added with cycle 4. This was discontinued in June of 2015. She is S/P Avastin maintenance only between June 2015 till March 2017. Therapy discontinued due to progression of disease. She is status post Doxil salvage chemotherapy therapy discontinued because of hypersensitivity reaction in March 2017.  Current therapy: Carboplatin for an AUC of 5 every 3 weeks. Cycle one started on 07/08/2015. She is here for her next cycle of chemotherapy.  Interim History:  The patient returns today for follow-up visit with her husband. She noticed some increased nausea with her last cycle of chemotherapy. The patient thinks this may have been related to the fact that she was also on doxycycline for cellulitis at the time. She has no nausea or vomiting today. Cellulitis near her Port-A-Cath has resolved. She denies any pain, redness, or drainage at the Port-A-Cath site.  She otherwise is doing well. She denies headaches, blurry vision, syncope, and seizures.  Denies fevers and chills. Denies chest pain and shortness of breath. Denies cough. Denies abdominal pain. No nausea or vomiting today. She reports occasional diarrhea. Denies chest and he GYN bleeding. Denies anxiety and depression. The remainder of the review of systems is unremarkable.  Medications: I have reviewed the patient's current medications.  Current Outpatient Prescriptions  Medication Sig Dispense Refill  . acetaminophen (TYLENOL) 325 MG tablet Take 2 tablets (650 mg total) by mouth every 4 (four) hours as needed for mild pain, fever or headache. 30 tablet 0  . Acetylcarnitine HCl (ACETYL L-CARNITINE PO) Take 2,000 mg by mouth at bedtime.    . ALPRAZolam (XANAX) 0.5 MG tablet Take 0.5 mg by mouth 2 (two) times daily as needed for anxiety.    Marland Kitchen aspirin 81 MG chewable tablet Chew 1 tablet (81 mg total) by mouth daily. 30 tablet 0  . atorvastatin (LIPITOR) 40 MG tablet Take 1 tablet (40 mg total) by mouth daily at 6 PM. 90 tablet 3  . carvedilol (COREG) 3.125 MG tablet Take 1/2 tablet by mouth twice daily with meals. 30 tablet 9  . clotrimazole-betamethasone (LOTRISONE) cream Apply 1 application topically 2 (two) times daily. 30 g 1  . divalproex (DEPAKOTE) 250 MG DR tablet Take by mouth. Take '500mg'$ s in the morning and '750mg'$ s at night    . fluticasone (FLONASE) 50 MCG/ACT nasal spray INHALE 2 SPRAYS INTO EACH NOSTRIL EVERY NIGHT  11  . gabapentin (NEURONTIN) 100 MG capsule Take 1 capsule by mouth twice daily. 180 capsule 3  . lamoTRIgine (LAMICTAL) 100 MG tablet Take 100 mg by mouth 2 (two) times daily.    Marland Kitchen levothyroxine (SYNTHROID, LEVOTHROID) 100 MCG tablet Take 1 tablet (100 mcg  total) by mouth daily before breakfast. 90 tablet 3  . lidocaine-prilocaine (EMLA) cream Apply topically as needed. Apply to port with every chemotherapy. 30 g 0  . loperamide (IMODIUM A-D) 2 MG tablet Take 2 mg by mouth as needed for diarrhea or loose stools.    Marland Kitchen loratadine (CLARITIN) 10 MG tablet Take 10 mg  by mouth daily as needed for allergies (with chemo).    . nitroGLYCERIN (NITROSTAT) 0.4 MG SL tablet Place 1 tablet (0.4 mg total) under the tongue every 5 (five) minutes as needed for chest pain. 25 tablet 3  . ondansetron (ZOFRAN ODT) 8 MG disintegrating tablet Take 1 tablet (8 mg total) by mouth every 8 (eight) hours as needed for nausea or vomiting. 20 tablet 1  . ondansetron (ZOFRAN) 8 MG tablet TAKE 1 TABLET BY MOUTH EVERY 8 HOURS AS NEEDED FOR NAUSEA AND VOMITING 30 tablet 2  . pantoprazole (PROTONIX) 40 MG tablet Take 1 tablet (40 mg total) by mouth daily. 30 tablet 11  . prochlorperazine (COMPAZINE) 10 MG tablet Take 1 tablet (10 mg total) by mouth every 6 (six) hours as needed for nausea or vomiting. 30 tablet 0  . rOPINIRole (REQUIP) 0.5 MG tablet Take 3 tablets (1.5 mg total) by mouth at bedtime. 135 tablet 2  . sertraline (ZOLOFT) 50 MG tablet      No current facility-administered medications for this visit.    Facility-Administered Medications Ordered in Other Visits  Medication Dose Route Frequency Provider Last Rate Last Dose  . heparin lock flush 100 unit/mL  500 Units Intravenous Once Wyatt Portela, MD      . sodium chloride flush (NS) 0.9 % injection 10 mL  10 mL Intravenous PRN Wyatt Portela, MD         Allergies:  Allergies  Allergen Reactions  . Doxil [Doxorubicin Hcl Liposomal] Anaphylaxis    1st Doxil.   . Pollen Extract Other (See Comments)    Pollen and grass causes a lot sneezing    Past Medical History, Surgical history, Social history, and Family History were reviewed and updated.  Review of Systems: Constitutional:  Negative for fever, chills, night sweats, anorexia, weight loss, pain. Cardiovascular: no chest pain or dyspnea on exertion Respiratory: no cough, shortness of breath, or wheezing Neurological: negative Dermatological: negative ENT: negative Skin: Negative. Gastrointestinal: negative Genito-Urinary: no dysuria, trouble voiding, or  hematuria Hematological and Lymphatic: negative Breast: negative for breast lumps negative Musculoskeletal: negative Remaining ROS negative.  Physical Exam: Blood pressure 130/67, pulse (!) 57, temperature 98.7 F (37.1 C), temperature source Oral, resp. rate 18, height '5\' 1"'$  (1.549 m), weight 157 lb 1.6 oz (71.3 kg), SpO2 99 %. ECOG: 1 General appearance: alert, cooperative and no distress Head: Normocephalic, without obvious abnormality, atraumatic Neck: no adenopathy, no carotid bruit, no JVD, supple, symmetrical, trachea midline and thyroid not enlarged, symmetric, no tenderness/mass/nodules Lymph nodes: Cervical, supraclavicular, and axillary nodes normal. Heart:regular rate and rhythm, S1, S2 normal, no murmur, click, rub or gallop Lung:chest clear, no wheezing, rales, normal symmetric air entry Abdomen: soft, non-tender, without masses or organomegaly EXT:no erythema, induration, or nodules   Lab Results: Lab Results  Component Value Date   WBC 2.8 (L) 08/29/2016   HGB 10.0 (L) 08/29/2016   HCT 33.3 (L) 08/29/2016   MCV 101.2 (H) 08/29/2016   PLT 52 (L) 08/29/2016     Chemistry      Component Value Date/Time   NA 142 08/29/2016 0923   K 4.5 08/29/2016  0923   CL 104 04/16/2016 0806   CO2 29 08/29/2016 0923   BUN 15.8 08/29/2016 0923   CREATININE 0.7 08/29/2016 0923      Component Value Date/Time   CALCIUM 9.3 08/29/2016 0923   ALKPHOS 82 08/29/2016 0923   AST 14 08/29/2016 0923   ALT 9 08/29/2016 0923   BILITOT 0.24 08/29/2016 0737       Radiological Studies: No results found.   Impression and Plan: 61 year old woman with the following issues:  1. Peritoneal carcinomatosis with omental involvement. The pathology confirmed the presence of adenocarcinoma of likely GYN etiology. Her CA 125 was elevated at 333.5 on 09/19/2012. She is status post systemic chemotherapy with an excellent response utilizing carboplatin and Taxotere and a Avastin . This therapy  was held in June of 2015 and currently on Avastin maintenance.   CT scan obtained on 05/05/2015 as well as her tumor markers showed clear progression. Her CA-125 is up to 189 and her CT scan showed increase in her peritoneal disease.   She is currently receiving salvage therapy with carboplatin single agent at AUC of 5 With excellent response.  CT scan obtained on 06/25/2016 Continues to show stable disease. Her CA-125 continues to decline indicating positive response to therapy. This number was down to 16 on 06/27/2016. She had a slight increase in this number last time because of the treatment break. CA 125 is pending today.  2. IV access: Port-A-Cath is without complications.  sialitis has resolved.  3. Thrombocytopenia: Her platelets remains relatively stable without bleeding. She will receive treatment as long as her platelet count above 50,000. Platelet count is 52,000 today and she will proceed with chemotherapy.  4. Abdominal distention: No ascites noted on ultrasound unlikely related to peritoneal carcinomatosis. No GI symptoms noted.  5. Left breast mass: She status post biopsy with the pathology indicating T2 N1 disease with the tumor have ER positive, PR negative HER-2 negative. Her Ki-67 is 50%.  Mammogram obtained on 03/20/2016 showed excellent response to systemic chemotherapy. No primary surgical therapy is indicated at this time. We will continue to monitor. Given her ovarian cancer, her breast cancer remains a minor issue for the time being.  8. Neutropenia: Neulasta will be given after a cycles of therapy. She will receive chemotherapy regardless to her [place]. ANC is 1.2 today and she will proceed with chemotherapy.  9. Chest wall cellulitis: Resolved.  10. Nausea: Well managed with Zofran and Compazine. She has requested orally disintegrating Zofran as she has some difficulty swallowing pills when she feels nauseated. A new prescription for Zofran ODT 8 mg every 8  hours is needed was sent to her local pharmacy.  11. Hypertension: Blood pressure is adequately controlled at this time.   12. The followup: She will follow-up in 3 weeks as previously scheduled   Spent more than half the time coordinating care.    Mikey Bussing, DNP, AGPCNP-BC, AOCNP 7/11/201810:33 AM

## 2016-08-29 NOTE — Patient Instructions (Signed)

## 2016-08-29 NOTE — Patient Instructions (Signed)
[facility] Discharge Instructions for Patients Receiving Chemotherapy  Today you received the following chemotherapy agents: Carboplatin   To help prevent nausea and vomiting after your treatment, we encourage you to take your nausea medication as directed.    If you develop nausea and vomiting that is not controlled by your nausea medication, call the clinic.   BELOW ARE SYMPTOMS THAT SHOULD BE REPORTED IMMEDIATELY:  *FEVER GREATER THAN 100.5 F  *CHILLS WITH OR WITHOUT FEVER  NAUSEA AND VOMITING THAT IS NOT CONTROLLED WITH YOUR NAUSEA MEDICATION  *UNUSUAL SHORTNESS OF BREATH  *UNUSUAL BRUISING OR BLEEDING  TENDERNESS IN MOUTH AND THROAT WITH OR WITHOUT PRESENCE OF ULCERS  *URINARY PROBLEMS  *BOWEL PROBLEMS  UNUSUAL RASH Items with * indicate a potential emergency and should be followed up as soon as possible.  Feel free to call the clinic you have any questions or concerns. The clinic phone number is [phone].  Please show the CHEMO ALERT CARD at check-in to the Emergency Department and triage nurse.   

## 2016-08-30 ENCOUNTER — Ambulatory Visit (HOSPITAL_BASED_OUTPATIENT_CLINIC_OR_DEPARTMENT_OTHER): Payer: PPO

## 2016-08-30 ENCOUNTER — Encounter: Payer: Self-pay | Admitting: Internal Medicine

## 2016-08-30 VITALS — BP 127/62 | HR 74 | Temp 97.3°F | Resp 20

## 2016-08-30 DIAGNOSIS — C482 Malignant neoplasm of peritoneum, unspecified: Secondary | ICD-10-CM

## 2016-08-30 DIAGNOSIS — Z5189 Encounter for other specified aftercare: Secondary | ICD-10-CM | POA: Diagnosis not present

## 2016-08-30 LAB — CA 125: CANCER ANTIGEN (CA) 125: 26.8 U/mL (ref 0.0–38.1)

## 2016-08-30 MED ORDER — PEGFILGRASTIM INJECTION 6 MG/0.6ML ~~LOC~~
6.0000 mg | PREFILLED_SYRINGE | Freq: Once | SUBCUTANEOUS | Status: AC
  Administered 2016-08-30: 6 mg via SUBCUTANEOUS
  Filled 2016-08-30: qty 0.6

## 2016-08-30 NOTE — Patient Instructions (Signed)
Pegfilgrastim injection What is this medicine? PEGFILGRASTIM (PEG fil gra stim) is a long-acting granulocyte colony-stimulating factor that stimulates the growth of neutrophils, a type of white blood cell important in the body's fight against infection. It is used to reduce the incidence of fever and infection in patients with certain types of cancer who are receiving chemotherapy that affects the bone marrow, and to increase survival after being exposed to high doses of radiation. This medicine may be used for other purposes; ask your health care provider or pharmacist if you have questions. What should I tell my health care provider before I take this medicine? They need to know if you have any of these conditions: -kidney disease -latex allergy -ongoing radiation therapy -sickle cell disease -skin reactions to acrylic adhesives (On-Body Injector only) -an unusual or allergic reaction to pegfilgrastim, filgrastim, other medicines, foods, dyes, or preservatives -pregnant or trying to get pregnant -breast-feeding How should I use this medicine? This medicine is for injection under the skin. If you get this medicine at home, you will be taught how to prepare and give the pre-filled syringe or how to use the On-body Injector. Refer to the patient Instructions for Use for detailed instructions. Use exactly as directed. Take your medicine at regular intervals. Do not take your medicine more often than directed. It is important that you put your used needles and syringes in a special sharps container. Do not put them in a trash can. If you do not have a sharps container, call your pharmacist or healthcare provider to get one. Talk to your pediatrician regarding the use of this medicine in children. While this drug may be prescribed for selected conditions, precautions do apply. Overdosage: If you think you have taken too much of this medicine contact a poison control center or emergency room at  once. NOTE: This medicine is only for you. Do not share this medicine with others. What if I miss a dose? It is important not to miss your dose. Call your doctor or health care professional if you miss your dose. If you miss a dose due to an On-body Injector failure or leakage, a new dose should be administered as soon as possible using a single prefilled syringe for manual use. What may interact with this medicine? Interactions have not been studied. Give your health care provider a list of all the medicines, herbs, non-prescription drugs, or dietary supplements you use. Also tell them if you smoke, drink alcohol, or use illegal drugs. Some items may interact with your medicine. This list may not describe all possible interactions. Give your health care provider a list of all the medicines, herbs, non-prescription drugs, or dietary supplements you use. Also tell them if you smoke, drink alcohol, or use illegal drugs. Some items may interact with your medicine. What should I watch for while using this medicine? You may need blood work done while you are taking this medicine. If you are going to need a MRI, CT scan, or other procedure, tell your doctor that you are using this medicine (On-Body Injector only). What side effects may I notice from receiving this medicine? Side effects that you should report to your doctor or health care professional as soon as possible: -allergic reactions like skin rash, itching or hives, swelling of the face, lips, or tongue -dizziness -fever -pain, redness, or irritation at site where injected -pinpoint red spots on the skin -red or dark-brown urine -shortness of breath or breathing problems -stomach or side pain, or pain   at the shoulder -swelling -tiredness -trouble passing urine or change in the amount of urine Side effects that usually do not require medical attention (report to your doctor or health care professional if they continue or are  bothersome): -bone pain -muscle pain This list may not describe all possible side effects. Call your doctor for medical advice about side effects. You may report side effects to FDA at 1-800-FDA-1088. Where should I keep my medicine? Keep out of the reach of children. Store pre-filled syringes in a refrigerator between 2 and 8 degrees C (36 and 46 degrees F). Do not freeze. Keep in carton to protect from light. Throw away this medicine if it is left out of the refrigerator for more than 48 hours. Throw away any unused medicine after the expiration date. NOTE: This sheet is a summary. It may not cover all possible information. If you have questions about this medicine, talk to your doctor, pharmacist, or health care provider.    2016, Elsevier/Gold Standard. (2014-02-25 14:30:14)  

## 2016-09-04 ENCOUNTER — Encounter: Payer: Self-pay | Admitting: *Deleted

## 2016-09-04 NOTE — Progress Notes (Signed)
Faxed dental treatment clearance to Christianne Borrow DDS. Fax number is [phone].

## 2016-09-19 ENCOUNTER — Ambulatory Visit (HOSPITAL_BASED_OUTPATIENT_CLINIC_OR_DEPARTMENT_OTHER): Payer: PPO | Admitting: Oncology

## 2016-09-19 ENCOUNTER — Ambulatory Visit: Payer: PPO

## 2016-09-19 ENCOUNTER — Other Ambulatory Visit (HOSPITAL_BASED_OUTPATIENT_CLINIC_OR_DEPARTMENT_OTHER): Payer: PPO

## 2016-09-19 ENCOUNTER — Ambulatory Visit (HOSPITAL_BASED_OUTPATIENT_CLINIC_OR_DEPARTMENT_OTHER): Payer: PPO

## 2016-09-19 ENCOUNTER — Telehealth: Payer: Self-pay | Admitting: Oncology

## 2016-09-19 VITALS — BP 131/48 | HR 62 | Temp 98.1°F | Resp 18 | Ht 61.0 in | Wt 158.3 lb

## 2016-09-19 DIAGNOSIS — Z170 Estrogen receptor positive status [ER+]: Secondary | ICD-10-CM | POA: Diagnosis not present

## 2016-09-19 DIAGNOSIS — C482 Malignant neoplasm of peritoneum, unspecified: Secondary | ICD-10-CM

## 2016-09-19 DIAGNOSIS — D696 Thrombocytopenia, unspecified: Secondary | ICD-10-CM

## 2016-09-19 DIAGNOSIS — C50919 Malignant neoplasm of unspecified site of unspecified female breast: Secondary | ICD-10-CM

## 2016-09-19 DIAGNOSIS — C50412 Malignant neoplasm of upper-outer quadrant of left female breast: Secondary | ICD-10-CM

## 2016-09-19 DIAGNOSIS — Z95828 Presence of other vascular implants and grafts: Secondary | ICD-10-CM

## 2016-09-19 DIAGNOSIS — Z5111 Encounter for antineoplastic chemotherapy: Secondary | ICD-10-CM

## 2016-09-19 LAB — CBC WITH DIFFERENTIAL/PLATELET
BASO%: 0 % (ref 0.0–2.0)
Basophils Absolute: 0 10*3/uL (ref 0.0–0.1)
EOS ABS: 0 10*3/uL (ref 0.0–0.5)
EOS%: 1.1 % (ref 0.0–7.0)
HEMATOCRIT: 33.7 % — AB (ref 34.8–46.6)
HGB: 10.2 g/dL — ABNORMAL LOW (ref 11.6–15.9)
LYMPH#: 1.2 10*3/uL (ref 0.9–3.3)
LYMPH%: 44.9 % (ref 14.0–49.7)
MCH: 30.7 pg (ref 25.1–34.0)
MCHC: 30.3 g/dL — ABNORMAL LOW (ref 31.5–36.0)
MCV: 101.5 fL — AB (ref 79.5–101.0)
MONO#: 0.3 10*3/uL (ref 0.1–0.9)
MONO%: 12.4 % (ref 0.0–14.0)
NEUT%: 41.6 % (ref 38.4–76.8)
NEUTROS ABS: 1.1 10*3/uL — AB (ref 1.5–6.5)
PLATELETS: 51 10*3/uL — AB (ref 145–400)
RBC: 3.32 10*6/uL — AB (ref 3.70–5.45)
RDW: 16.5 % — ABNORMAL HIGH (ref 11.2–14.5)
WBC: 2.7 10*3/uL — ABNORMAL LOW (ref 3.9–10.3)

## 2016-09-19 LAB — COMPREHENSIVE METABOLIC PANEL
ALT: 9 U/L (ref 0–55)
ANION GAP: 10 meq/L (ref 3–11)
AST: 13 U/L (ref 5–34)
Albumin: 3.6 g/dL (ref 3.5–5.0)
Alkaline Phosphatase: 90 U/L (ref 40–150)
BILIRUBIN TOTAL: 0.28 mg/dL (ref 0.20–1.20)
BUN: 13.7 mg/dL (ref 7.0–26.0)
CALCIUM: 9.3 mg/dL (ref 8.4–10.4)
CO2: 26 mEq/L (ref 22–29)
CREATININE: 0.7 mg/dL (ref 0.6–1.1)
Chloride: 107 mEq/L (ref 98–109)
EGFR: >90 mL/min/{1.73_m2} (ref >90)
Glucose: 107 mg/dl (ref 70–140)
Potassium: 3.8 mEq/L (ref 3.5–5.1)
Sodium: 144 mEq/L (ref 136–145)
TOTAL PROTEIN: 7.2 g/dL (ref 6.4–8.3)

## 2016-09-19 MED ORDER — SODIUM CHLORIDE 0.9 % IV SOLN
570.0000 mg | Freq: Once | INTRAVENOUS | Status: AC
  Administered 2016-09-19: 570 mg via INTRAVENOUS
  Filled 2016-09-19: qty 57

## 2016-09-19 MED ORDER — PALONOSETRON HCL INJECTION 0.25 MG/5ML
INTRAVENOUS | Status: AC
  Filled 2016-09-19: qty 5

## 2016-09-19 MED ORDER — DEXAMETHASONE SODIUM PHOSPHATE 10 MG/ML IJ SOLN
4.0000 mg | Freq: Once | INTRAMUSCULAR | Status: AC
  Administered 2016-09-19: 4 mg via INTRAVENOUS

## 2016-09-19 MED ORDER — CARBOPLATIN CHEMO INTRADERMAL TEST DOSE 100MCG/0.02ML
100.0000 ug | Freq: Once | INTRADERMAL | Status: AC
  Administered 2016-09-19: 100 ug via INTRADERMAL
  Filled 2016-09-19: qty 0.01

## 2016-09-19 MED ORDER — DEXAMETHASONE SODIUM PHOSPHATE 10 MG/ML IJ SOLN
INTRAMUSCULAR | Status: AC
  Filled 2016-09-19: qty 1

## 2016-09-19 MED ORDER — SODIUM CHLORIDE 0.9 % IV SOLN
Freq: Once | INTRAVENOUS | Status: AC
  Administered 2016-09-19: 11:00:00 via INTRAVENOUS

## 2016-09-19 MED ORDER — PALONOSETRON HCL INJECTION 0.25 MG/5ML
0.2500 mg | Freq: Once | INTRAVENOUS | Status: AC
  Administered 2016-09-19: 0.25 mg via INTRAVENOUS

## 2016-09-19 MED ORDER — HEPARIN SOD (PORK) LOCK FLUSH 100 UNIT/ML IV SOLN
500.0000 [IU] | Freq: Once | INTRAVENOUS | Status: AC | PRN
  Administered 2016-09-19: 500 [IU]
  Filled 2016-09-19: qty 5

## 2016-09-19 MED ORDER — SODIUM CHLORIDE 0.9 % IJ SOLN
10.0000 mL | INTRAMUSCULAR | Status: DC | PRN
  Administered 2016-09-19: 10 mL via INTRAVENOUS
  Filled 2016-09-19: qty 10

## 2016-09-19 MED ORDER — SODIUM CHLORIDE 0.9% FLUSH
10.0000 mL | INTRAVENOUS | Status: DC | PRN
  Administered 2016-09-19: 10 mL
  Filled 2016-09-19: qty 10

## 2016-09-19 NOTE — Telephone Encounter (Signed)
Scheduled appt per 8/1 los - Patient did not want AVS or calender printed - my chart active.

## 2016-09-19 NOTE — Progress Notes (Signed)
Per [facility], Dr. Alen Blew advised ok to treat with today's ANC and platelets.

## 2016-09-19 NOTE — Patient Instructions (Signed)

## 2016-09-19 NOTE — Progress Notes (Signed)
Per Dr. Shadad, it's OK to treat with today's labs. Infusion room nurse notified. 

## 2016-09-19 NOTE — Progress Notes (Signed)
Hematology and Oncology Follow Up Visit  [name] [id number] [birth year] 61 y.o. 09/19/2016 9:41 AM    Principle Diagnosis: 61 year old woman diagnosed with:  1. 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 This was diagnosed in July of 2014. 2. 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 The tumor is ER positive PR negative, HER-2 negative.   Prior Therapy:  She is status post paracentesis performed on 09/17/2012 with the cytology confirmed the presence of adenocarcinoma and immunohistochemical stains suggest GYN etiology. She is also status post lumpectomy for breast cancer diagnosed 25 years ago followed by radiation and chemotherapy under the care of Dr. Sonny Dandy. She did not receive any hormonal therapy. Chemotherapy utilizing carboplatin and Taxotere started on 10/01/2012. Avastin was added with cycle 4. This was discontinued in June of 2015. She is S/P Avastin maintenance only between June 2015 till March 2017. Therapy discontinued due to progression of disease. She is status post Doxil salvage chemotherapy therapy discontinued because of hypersensitivity reaction in March 2017.  Current therapy:  Carboplatin and AUC of 5 every 3 weeks cycle 1 started on 07/08/2015. She is here for her next cycle of therapy.  Interim History: [name] presents today for a followup visit with her husband. Since her last visit, she reports no major changes in her health. She does report some mild fatigue but for the most part her performance status and activity level is unchanged. She tolerated her last chemotherapy without any complications. She denied any nausea, vomiting or bleeding. She does not report any abdominal pain or early satiety. Her appetite is excellent and her weight is stable. She denied any petechiae or bleeding. She denied any respiratory symptoms or  hospitalizations.  She does not report any headaches, blurry vision, syncope or seizures. She denied any fever chills. She does report some chest discomfort but no dyspnea on exertion. Has not reported any shortness of breath or cough. She denied any pain with swallowing. She does not report any vomiting, constipation does report some occasional diarrhea. She denied any GYN bleeding. She reports no anxiety or depression. Remainder of the review of systems is unremarkable.  Medications: I have reviewed the patient's current medications.  Current Outpatient Prescriptions  Medication Sig Dispense Refill  . acetaminophen (TYLENOL) 325 MG tablet Take 2 tablets (650 mg total) by mouth every 4 (four) hours as needed for mild pain, fever or headache. 30 tablet 0  . Acetylcarnitine HCl (ACETYL L-CARNITINE PO) Take 2,000 mg by mouth at bedtime.    . ALPRAZolam (XANAX) 0.5 MG tablet Take 0.5 mg by mouth 2 (two) times daily as needed for anxiety.    Marland Kitchen aspirin 81 MG chewable tablet Chew 1 tablet (81 mg total) by mouth daily. 30 tablet 0  . atorvastatin (LIPITOR) 40 MG tablet Take 1 tablet (40 mg total) by mouth daily at 6 PM. 90 tablet 3  . carvedilol (COREG) 3.125 MG tablet Take 1/2 tablet by mouth twice daily with meals. 30 tablet 9  . clotrimazole-betamethasone (LOTRISONE) cream Apply 1 application topically 2 (two) times daily. 30 g 1  . divalproex (DEPAKOTE) 250 MG DR tablet Take by mouth. Take 555ms in the morning and 7546m at night    . fluticasone (FLONASE) 50 MCG/ACT nasal spray INHALE 2 SPRAYS INTO EACH NOSTRIL EVERY NIGHT  11  . gabapentin (NEURONTIN) 100 MG capsule Take 1 capsule by  mouth twice daily. 180 capsule 3  . lamoTRIgine (LAMICTAL) 100 MG tablet Take 100 mg by mouth 2 (two) times daily.    Marland Kitchen levothyroxine (SYNTHROID, LEVOTHROID) 100 MCG tablet Take 1 tablet (100 mcg total) by mouth daily before breakfast. 90 tablet 3  . lidocaine-prilocaine (EMLA) cream Apply topically as needed. Apply  to port with every chemotherapy. 30 g 0  . loperamide (IMODIUM A-D) 2 MG tablet Take 2 mg by mouth as needed for diarrhea or loose stools.    Marland Kitchen loratadine (CLARITIN) 10 MG tablet Take 10 mg by mouth daily as needed for allergies (with chemo).    . nitroGLYCERIN (NITROSTAT) 0.4 MG SL tablet Place 1 tablet (0.4 mg total) under the tongue every 5 (five) minutes as needed for chest pain. 25 tablet 3  . ondansetron (ZOFRAN ODT) 8 MG disintegrating tablet Take 1 tablet (8 mg total) by mouth every 8 (eight) hours as needed for nausea or vomiting. 20 tablet 1  . ondansetron (ZOFRAN) 8 MG tablet TAKE 1 TABLET BY MOUTH EVERY 8 HOURS AS NEEDED FOR NAUSEA AND VOMITING 30 tablet 2  . pantoprazole (PROTONIX) 40 MG tablet Take 1 tablet (40 mg total) by mouth daily. 30 tablet 11  . prochlorperazine (COMPAZINE) 10 MG tablet Take 1 tablet (10 mg total) by mouth every 6 (six) hours as needed for nausea or vomiting. 30 tablet 0  . rOPINIRole (REQUIP) 0.5 MG tablet Take 3 tablets (1.5 mg total) by mouth at bedtime. 135 tablet 2  . sertraline (ZOLOFT) 50 MG tablet      No current facility-administered medications for this visit.    Facility-Administered Medications Ordered in Other Visits  Medication Dose Route Frequency Provider Last Rate Last Dose  . heparin lock flush 100 unit/mL  500 Units Intravenous Once Wyatt Portela, MD      . sodium chloride flush (NS) 0.9 % injection 10 mL  10 mL Intravenous PRN Wyatt Portela, MD         Allergies:  Allergies  Allergen Reactions  . Doxil [Doxorubicin Hcl Liposomal] Anaphylaxis    1st Doxil.   . Pollen Extract Other (See Comments)    Pollen and grass causes a lot sneezing    Past Medical History, Surgical history, Social history, and Family History were reviewed and updated.   Physical Exam: Blood pressure (!) 131/48, pulse 62, temperature 98.1 F (36.7 C), temperature source Oral, resp. rate 18, height '5\' 1"'$  (1.549 m), weight 158 lb 4.8 oz (71.8 kg), SpO2  100 %.   ECOG: 1 General appearance: Well-appearing woman without distress. Head: Normocephalic, without obvious abnormality. No oral ulcers or thrush. Neck: no adenopathy or masses. Lymph nodes: Cervical, supraclavicular, and axillary nodes normal. Heart:regular rate and rhythm, S1, S2 normal, no murmur, click, rub or gallop Lung:chest clear, no wheezing, rales, normal symmetric. Chest wall examination:  Port-A-Cath site without any abnormalities. Well-healed chest wall lesion. Abdomin: Soft, nontender with good bowel sounds. No rebound or guarding. EXT:no erythema, induration, or nodules. No edema. Skin: No rashes or lesions. No ecchymosis.  Lab Results: Lab Results  Component Value Date   WBC 2.7 (L) 09/19/2016   HGB 10.2 (L) 09/19/2016   HCT 33.7 (L) 09/19/2016   MCV 101.5 (H) 09/19/2016   PLT 51 (L) 09/19/2016     Chemistry      Component Value Date/Time   NA 142 08/29/2016 0923   K 4.5 08/29/2016 0923   CL 104 04/16/2016 0806   CO2 29 08/29/2016  0923   BUN 15.8 08/29/2016 0923   CREATININE 0.7 08/29/2016 0923      Component Value Date/Time   CALCIUM 9.3 08/29/2016 0923   ALKPHOS 82 08/29/2016 0923   AST 14 08/29/2016 0923   ALT 9 08/29/2016 0923   BILITOT 0.24 08/29/2016 0923      Results for VIOLET, SEABURY (MRN [id number]) as of 09/19/2016 09:28  Ref. Range 06/27/2016 09:04 08/08/2016 08:49 08/29/2016 09:23  Cancer Antigen (CA) 125 Latest Ref Range: 0.0 - 38.1 U/mL 16.1 24.2 26.8    Impression and Plan:  61 year old woman with the following issues:  1. Peritoneal carcinomatosis with omental involvement. The pathology confirmed the presence of adenocarcinoma of likely GYN etiology. Her CA 125 was elevated at 333.5 on 09/19/2012. She is status post systemic chemotherapy with an excellent response utilizing carboplatin and Taxotere and a Avastin . This therapy was held in June of 2015 and currently on Avastin maintenance.   CT scan obtained on 05/05/2015 as well  as her tumor markers showed clear progression. Her CA-125 is up to 189 and her CT scan showed increase in her peritoneal disease.   She is currently receiving salvage therapy with carboplatin single agent at AUC of 5 With excellent response.  CT scan obtained on 06/25/2016 Continues to show stable disease.   Her CA-125 remains relatively stable and she continues to tolerate this therapy well. The plan is to continue with the same dose and schedule and repeat imaging studies in September 2018.  2. IV access: Port-A-Cath is without complications. No recent infection noted.  3. Thrombocytopenia: Her platelets remains relatively stable without bleeding. She will receive treatment as long as her platelet count above 50,000.  4. Abdominal distention: No ascites noted on ultrasound unlikely related to peritoneal carcinomatosis. No GI symptoms noted.  5. Left breast mass: She status post biopsy with the pathology indicating T2 N1 disease with the tumor have ER positive, PR negative HER-2 negative. Her Ki-67 is 50%.  Mammogram obtained on 03/20/2016 showed excellent response to systemic chemotherapy. No primary surgical therapy is indicated at this time. We will continue to monitor. Given her ovarian cancer, her breast cancer remains a minor issue for the time being.  8. Neutropenia: Neulasta will be given after a cycles of therapy. She will receive chemotherapy regardless to her [place].  9. Chest wall cellulitis: Resolved at this time.  10. Nausea: Well-controlled with antinausea medication.  11. Hypertension: Blood pressure is adequately controlled at this time.   12. The followup: She will follow-up in 3 weeks for chemotherapy infusion.     [facility], MD 8/1/20189:41 AM

## 2016-09-20 ENCOUNTER — Ambulatory Visit (HOSPITAL_BASED_OUTPATIENT_CLINIC_OR_DEPARTMENT_OTHER): Payer: PPO

## 2016-09-20 VITALS — BP 115/51 | HR 72 | Temp 98.1°F | Resp 18

## 2016-09-20 DIAGNOSIS — C482 Malignant neoplasm of peritoneum, unspecified: Secondary | ICD-10-CM | POA: Diagnosis not present

## 2016-09-20 DIAGNOSIS — Z5189 Encounter for other specified aftercare: Secondary | ICD-10-CM | POA: Diagnosis not present

## 2016-09-20 LAB — CA 125: CANCER ANTIGEN (CA) 125: 27.2 U/mL (ref 0.0–38.1)

## 2016-09-20 MED ORDER — PEGFILGRASTIM INJECTION 6 MG/0.6ML ~~LOC~~
6.0000 mg | PREFILLED_SYRINGE | Freq: Once | SUBCUTANEOUS | Status: AC
  Administered 2016-09-20: 6 mg via SUBCUTANEOUS
  Filled 2016-09-20: qty 0.6

## 2016-09-20 NOTE — Patient Instructions (Signed)
Pegfilgrastim injection What is this medicine? PEGFILGRASTIM (PEG fil gra stim) is a long-acting granulocyte colony-stimulating factor that stimulates the growth of neutrophils, a type of white blood cell important in the body's fight against infection. It is used to reduce the incidence of fever and infection in patients with certain types of cancer who are receiving chemotherapy that affects the bone marrow, and to increase survival after being exposed to high doses of radiation. This medicine may be used for other purposes; ask your health care provider or pharmacist if you have questions. What should I tell my health care provider before I take this medicine? They need to know if you have any of these conditions: -kidney disease -latex allergy -ongoing radiation therapy -sickle cell disease -skin reactions to acrylic adhesives (On-Body Injector only) -an unusual or allergic reaction to pegfilgrastim, filgrastim, other medicines, foods, dyes, or preservatives -pregnant or trying to get pregnant -breast-feeding How should I use this medicine? This medicine is for injection under the skin. If you get this medicine at home, you will be taught how to prepare and give the pre-filled syringe or how to use the On-body Injector. Refer to the patient Instructions for Use for detailed instructions. Use exactly as directed. Take your medicine at regular intervals. Do not take your medicine more often than directed. It is important that you put your used needles and syringes in a special sharps container. Do not put them in a trash can. If you do not have a sharps container, call your pharmacist or healthcare provider to get one. Talk to your pediatrician regarding the use of this medicine in children. While this drug may be prescribed for selected conditions, precautions do apply. Overdosage: If you think you have taken too much of this medicine contact a poison control center or emergency room at  once. NOTE: This medicine is only for you. Do not share this medicine with others. What if I miss a dose? It is important not to miss your dose. Call your doctor or health care professional if you miss your dose. If you miss a dose due to an On-body Injector failure or leakage, a new dose should be administered as soon as possible using a single prefilled syringe for manual use. What may interact with this medicine? Interactions have not been studied. Give your health care provider a list of all the medicines, herbs, non-prescription drugs, or dietary supplements you use. Also tell them if you smoke, drink alcohol, or use illegal drugs. Some items may interact with your medicine. This list may not describe all possible interactions. Give your health care provider a list of all the medicines, herbs, non-prescription drugs, or dietary supplements you use. Also tell them if you smoke, drink alcohol, or use illegal drugs. Some items may interact with your medicine. What should I watch for while using this medicine? You may need blood work done while you are taking this medicine. If you are going to need a MRI, CT scan, or other procedure, tell your doctor that you are using this medicine (On-Body Injector only). What side effects may I notice from receiving this medicine? Side effects that you should report to your doctor or health care professional as soon as possible: -allergic reactions like skin rash, itching or hives, swelling of the face, lips, or tongue -dizziness -fever -pain, redness, or irritation at site where injected -pinpoint red spots on the skin -red or dark-brown urine -shortness of breath or breathing problems -stomach or side pain, or pain   at the shoulder -swelling -tiredness -trouble passing urine or change in the amount of urine Side effects that usually do not require medical attention (report to your doctor or health care professional if they continue or are  bothersome): -bone pain -muscle pain This list may not describe all possible side effects. Call your doctor for medical advice about side effects. You may report side effects to FDA at 1-800-FDA-1088. Where should I keep my medicine? Keep out of the reach of children. Store pre-filled syringes in a refrigerator between 2 and 8 degrees C (36 and 46 degrees F). Do not freeze. Keep in carton to protect from light. Throw away this medicine if it is left out of the refrigerator for more than 48 hours. Throw away any unused medicine after the expiration date. NOTE: This sheet is a summary. It may not cover all possible information. If you have questions about this medicine, talk to your doctor, pharmacist, or health care provider.    2016, Elsevier/Gold Standard. (2014-02-25 14:30:14)  

## 2016-09-25 ENCOUNTER — Encounter: Payer: Self-pay | Admitting: Oncology

## 2016-09-25 DIAGNOSIS — F3131 Bipolar disorder, current episode depressed, mild: Secondary | ICD-10-CM | POA: Diagnosis not present

## 2016-10-10 ENCOUNTER — Ambulatory Visit (HOSPITAL_BASED_OUTPATIENT_CLINIC_OR_DEPARTMENT_OTHER): Payer: PPO

## 2016-10-10 ENCOUNTER — Ambulatory Visit: Payer: PPO

## 2016-10-10 ENCOUNTER — Ambulatory Visit (HOSPITAL_BASED_OUTPATIENT_CLINIC_OR_DEPARTMENT_OTHER): Payer: PPO | Admitting: Oncology

## 2016-10-10 ENCOUNTER — Other Ambulatory Visit (HOSPITAL_BASED_OUTPATIENT_CLINIC_OR_DEPARTMENT_OTHER): Payer: PPO

## 2016-10-10 ENCOUNTER — Telehealth: Payer: Self-pay | Admitting: Oncology

## 2016-10-10 VITALS — BP 114/67 | HR 71 | Temp 98.3°F | Resp 18 | Ht 61.0 in | Wt 159.4 lb

## 2016-10-10 DIAGNOSIS — C50919 Malignant neoplasm of unspecified site of unspecified female breast: Secondary | ICD-10-CM

## 2016-10-10 DIAGNOSIS — Z5111 Encounter for antineoplastic chemotherapy: Secondary | ICD-10-CM

## 2016-10-10 DIAGNOSIS — C50412 Malignant neoplasm of upper-outer quadrant of left female breast: Secondary | ICD-10-CM

## 2016-10-10 DIAGNOSIS — C482 Malignant neoplasm of peritoneum, unspecified: Secondary | ICD-10-CM

## 2016-10-10 DIAGNOSIS — Z95828 Presence of other vascular implants and grafts: Secondary | ICD-10-CM

## 2016-10-10 DIAGNOSIS — Z170 Estrogen receptor positive status [ER+]: Secondary | ICD-10-CM

## 2016-10-10 LAB — COMPREHENSIVE METABOLIC PANEL
ALBUMIN: 3.3 g/dL — AB (ref 3.5–5.0)
ALK PHOS: 89 U/L (ref 40–150)
ALT: 8 U/L (ref 0–55)
ANION GAP: 8 meq/L (ref 3–11)
AST: 11 U/L (ref 5–34)
BUN: 9.2 mg/dL (ref 7.0–26.0)
CO2: 28 meq/L (ref 22–29)
Calcium: 9 mg/dL (ref 8.4–10.4)
Chloride: 106 mEq/L (ref 98–109)
Creatinine: 0.7 mg/dL (ref 0.6–1.1)
GLUCOSE: 143 mg/dL — AB (ref 70–140)
POTASSIUM: 3.9 meq/L (ref 3.5–5.1)
SODIUM: 142 meq/L (ref 136–145)
Total Bilirubin: 0.22 mg/dL (ref 0.20–1.20)
Total Protein: 7 g/dL (ref 6.4–8.3)

## 2016-10-10 LAB — CBC WITH DIFFERENTIAL/PLATELET
BASO%: 0.4 % (ref 0.0–2.0)
BASOS ABS: 0 10*3/uL (ref 0.0–0.1)
EOS ABS: 0 10*3/uL (ref 0.0–0.5)
EOS%: 1 % (ref 0.0–7.0)
HCT: 31 % — ABNORMAL LOW (ref 34.8–46.6)
HGB: 9.8 g/dL — ABNORMAL LOW (ref 11.6–15.9)
LYMPH%: 43 % (ref 14.0–49.7)
MCH: 31.4 pg (ref 25.1–34.0)
MCHC: 31.7 g/dL (ref 31.5–36.0)
MCV: 98.9 fL (ref 79.5–101.0)
MONO#: 0.3 10*3/uL (ref 0.1–0.9)
MONO%: 10.3 % (ref 0.0–14.0)
NEUT#: 1.3 10*3/uL — ABNORMAL LOW (ref 1.5–6.5)
NEUT%: 45.3 % (ref 38.4–76.8)
PLATELETS: 60 10*3/uL — AB (ref 145–400)
RBC: 3.13 10*6/uL — AB (ref 3.70–5.45)
RDW: 19.4 % — ABNORMAL HIGH (ref 11.2–14.5)
WBC: 2.9 10*3/uL — ABNORMAL LOW (ref 3.9–10.3)
lymph#: 1.2 10*3/uL (ref 0.9–3.3)

## 2016-10-10 MED ORDER — PALONOSETRON HCL INJECTION 0.25 MG/5ML
0.2500 mg | Freq: Once | INTRAVENOUS | Status: AC
  Administered 2016-10-10: 0.25 mg via INTRAVENOUS

## 2016-10-10 MED ORDER — CARBOPLATIN CHEMO INJECTION 600 MG/60ML
570.0000 mg | Freq: Once | INTRAVENOUS | Status: AC
  Administered 2016-10-10: 570 mg via INTRAVENOUS
  Filled 2016-10-10: qty 57

## 2016-10-10 MED ORDER — CARBOPLATIN CHEMO INTRADERMAL TEST DOSE 100MCG/0.02ML
100.0000 ug | Freq: Once | INTRADERMAL | Status: AC
  Administered 2016-10-10: 100 ug via INTRADERMAL
  Filled 2016-10-10: qty 0.02

## 2016-10-10 MED ORDER — DEXAMETHASONE SODIUM PHOSPHATE 10 MG/ML IJ SOLN
INTRAMUSCULAR | Status: AC
  Filled 2016-10-10: qty 1

## 2016-10-10 MED ORDER — DEXAMETHASONE SODIUM PHOSPHATE 10 MG/ML IJ SOLN
4.0000 mg | Freq: Once | INTRAMUSCULAR | Status: AC
  Administered 2016-10-10: 4 mg via INTRAVENOUS

## 2016-10-10 MED ORDER — HEPARIN SOD (PORK) LOCK FLUSH 100 UNIT/ML IV SOLN
500.0000 [IU] | Freq: Once | INTRAVENOUS | Status: AC | PRN
  Administered 2016-10-10: 500 [IU]
  Filled 2016-10-10: qty 5

## 2016-10-10 MED ORDER — SODIUM CHLORIDE 0.9% FLUSH
10.0000 mL | INTRAVENOUS | Status: DC | PRN
  Administered 2016-10-10: 10 mL
  Filled 2016-10-10: qty 10

## 2016-10-10 MED ORDER — SODIUM CHLORIDE 0.9 % IJ SOLN
10.0000 mL | INTRAMUSCULAR | Status: DC | PRN
  Administered 2016-10-10: 10 mL via INTRAVENOUS
  Filled 2016-10-10: qty 10

## 2016-10-10 MED ORDER — SODIUM CHLORIDE 0.9 % IV SOLN
Freq: Once | INTRAVENOUS | Status: AC
  Administered 2016-10-10: 12:00:00 via INTRAVENOUS

## 2016-10-10 MED ORDER — PALONOSETRON HCL INJECTION 0.25 MG/5ML
INTRAVENOUS | Status: AC
  Filled 2016-10-10: qty 5

## 2016-10-10 NOTE — Progress Notes (Signed)
Ok to treat with ANC 1.3 and Platelets 60 per Dr. Alen Blew.

## 2016-10-10 NOTE — Patient Instructions (Signed)
[facility] Discharge Instructions for Patients Receiving Chemotherapy  Today you received the following chemotherapy agents :  Carboplatin.  To help prevent nausea and vomiting after your treatment, we encourage you to take your nausea medication as prescribed.   If you develop nausea and vomiting that is not controlled by your nausea medication, call the clinic.   BELOW ARE SYMPTOMS THAT SHOULD BE REPORTED IMMEDIATELY:  *FEVER GREATER THAN 100.5 F  *CHILLS WITH OR WITHOUT FEVER  NAUSEA AND VOMITING THAT IS NOT CONTROLLED WITH YOUR NAUSEA MEDICATION  *UNUSUAL SHORTNESS OF BREATH  *UNUSUAL BRUISING OR BLEEDING  TENDERNESS IN MOUTH AND THROAT WITH OR WITHOUT PRESENCE OF ULCERS  *URINARY PROBLEMS  *BOWEL PROBLEMS  UNUSUAL RASH Items with * indicate a potential emergency and should be followed up as soon as possible.  Feel free to call the clinic you have any questions or concerns. The clinic phone number is [phone].  Please show the CHEMO ALERT CARD at check-in to the Emergency Department and triage nurse.   

## 2016-10-10 NOTE — Patient Instructions (Signed)
Implanted Port Home Guide An implanted port is a type of central line that is placed under the skin. Central lines are used to provide IV access when treatment or nutrition needs to be given through a person's veins. Implanted ports are used for long-term IV access. An implanted port may be placed because:  You need IV medicine that would be irritating to the small veins in your hands or arms.  You need long-term IV medicines, such as antibiotics.  You need IV nutrition for a long period.  You need frequent blood draws for lab tests.  You need dialysis.  Implanted ports are usually placed in the chest area, but they can also be placed in the upper arm, the abdomen, or the leg. An implanted port has two main parts:  Reservoir. The reservoir is round and will appear as a small, raised area under your skin. The reservoir is the part where a needle is inserted to give medicines or draw blood.  Catheter. The catheter is a thin, flexible tube that extends from the reservoir. The catheter is placed into a large vein. Medicine that is inserted into the reservoir goes into the catheter and then into the vein.  How will I care for my incision site? Do not get the incision site wet. Bathe or shower as directed by your health care provider. How is my port accessed? Special steps must be taken to access the port:  Before the port is accessed, a numbing cream can be placed on the skin. This helps numb the skin over the port site.  Your health care provider uses a sterile technique to access the port. ? Your health care provider must put on a mask and sterile gloves. ? The skin over your port is cleaned carefully with an antiseptic and allowed to dry. ? The port is gently pinched between sterile gloves, and a needle is inserted into the port.  Only "non-coring" port needles should be used to access the port. Once the port is accessed, a blood return should be checked. This helps ensure that the port  is in the vein and is not clogged.  If your port needs to remain accessed for a constant infusion, a clear (transparent) bandage will be placed over the needle site. The bandage and needle will need to be changed every week, or as directed by your health care provider.  Keep the bandage covering the needle clean and dry. Do not get it wet. Follow your health care provider's instructions on how to take a shower or bath while the port is accessed.  If your port does not need to stay accessed, no bandage is needed over the port.  What is flushing? Flushing helps keep the port from getting clogged. Follow your health care provider's instructions on how and when to flush the port. Ports are usually flushed with saline solution or a medicine called heparin. The need for flushing will depend on how the port is used.  If the port is used for intermittent medicines or blood draws, the port will need to be flushed: ? After medicines have been given. ? After blood has been drawn. ? As part of routine maintenance.  If a constant infusion is running, the port may not need to be flushed.  How long will my port stay implanted? The port can stay in for as long as your health care provider thinks it is needed. When it is time for the port to come out, surgery will be   done to remove it. The procedure is similar to the one performed when the port was put in. When should I seek immediate medical care? When you have an implanted port, you should seek immediate medical care if:  You notice a bad smell coming from the incision site.  You have swelling, redness, or drainage at the incision site.  You have more swelling or pain at the port site or the surrounding area.  You have a fever that is not controlled with medicine.  This information is not intended to replace advice given to you by your health care provider. Make sure you discuss any questions you have with your health care provider. Document  Released: 02/05/2005 Document Revised: 07/14/2015 Document Reviewed: 10/13/2012 Elsevier Interactive Patient Education  2017 [employer]  

## 2016-10-10 NOTE — Telephone Encounter (Signed)
Scheduled appt per 8/22 los - patient my chart active - did not want AVS or calender .

## 2016-10-10 NOTE — Progress Notes (Signed)
Hematology and Oncology Follow Up Visit  [name] [id number] 05/03/1955 61 y.o. 10/10/2016 11:09 AM    Principle Diagnosis: 61 year old woman diagnosed with:  1. 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 This was diagnosed in July of 2014. 2. 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 The tumor is ER positive PR negative, HER-2 negative.   Prior Therapy:  She is status post paracentesis performed on 09/17/2012 with the cytology confirmed the presence of adenocarcinoma and immunohistochemical stains suggest GYN etiology. She is also status post lumpectomy for breast cancer diagnosed 25 years ago followed by radiation and chemotherapy under the care of Dr. Sonny Dandy. She did not receive any hormonal therapy. Chemotherapy utilizing carboplatin and Taxotere started on 10/01/2012. Avastin was added with cycle 4. This was discontinued in June of 2015. She is S/P Avastin maintenance only between June 2015 till March 2017. Therapy discontinued due to progression of disease. She is status post Doxil salvage chemotherapy therapy discontinued because of hypersensitivity reaction in March 2017.  Current therapy:  Carboplatin and AUC of 5 every 3 weeks cycle 1 started on 07/08/2015. She is here for her next cycle of therapy.  Interim History: [name] presents today for a followup visit with her husband. Since her last visit, she reports slightly more fatigued than previous visits. She denies any shortness of breath or difficulty breathing. She denied any infusion-related complications to chemotherapy. She denied any nausea, vomiting or bleeding. She does not report any abdominal pain or early satiety. Her appetite is excellent and her weight is stable. She denied any petechiae or bleeding. She continues to participate in multiple activities but have poor sleeping  habits. She stays up too late and has to sleep in every day and when she has a early appointment she struggles with energy. She still ambulating without any falls or syncope.  She does not report any headaches, blurry vision, syncope or seizures. She denied any fever chills. She does report some chest discomfort but no dyspnea on exertion. Has not reported any shortness of breath or cough. She denied any pain with swallowing. She does not report any vomiting, constipation does report some occasional diarrhea. She denied any GYN bleeding. She reports no anxiety or depression. Remainder of the review of systems is unremarkable.  Medications: I have reviewed the patient's current medications.  Current Outpatient Prescriptions  Medication Sig Dispense Refill  . acetaminophen (TYLENOL) 325 MG tablet Take 2 tablets (650 mg total) by mouth every 4 (four) hours as needed for mild pain, fever or headache. 30 tablet 0  . Acetylcarnitine HCl (ACETYL L-CARNITINE PO) Take 2,000 mg by mouth at bedtime.    . ALPRAZolam (XANAX) 0.5 MG tablet Take 0.5 mg by mouth 2 (two) times daily as needed for anxiety.    Marland Kitchen aspirin 81 MG chewable tablet Chew 1 tablet (81 mg total) by mouth daily. 30 tablet 0  . atorvastatin (LIPITOR) 40 MG tablet Take 1 tablet (40 mg total) by mouth daily at 6 PM. 90 tablet 3  . carvedilol (COREG) 3.125 MG tablet Take 1/2 tablet by mouth twice daily with meals. 30 tablet 9  . clotrimazole-betamethasone (LOTRISONE) cream Apply 1 application topically 2 (two) times daily. 30 g 1  . divalproex (DEPAKOTE) 250 MG DR tablet Take by mouth. Take 574ms in the morning and 7554m at night    . fluticasone (FLONASE)  50 MCG/ACT nasal spray INHALE 2 SPRAYS INTO EACH NOSTRIL EVERY NIGHT  11  . gabapentin (NEURONTIN) 100 MG capsule Take 1 capsule by mouth twice daily. 180 capsule 3  . lamoTRIgine (LAMICTAL) 100 MG tablet Take 100 mg by mouth 2 (two) times daily.    Marland Kitchen levothyroxine (SYNTHROID, LEVOTHROID) 100  MCG tablet Take 1 tablet (100 mcg total) by mouth daily before breakfast. 90 tablet 3  . lidocaine-prilocaine (EMLA) cream Apply topically as needed. Apply to port with every chemotherapy. 30 g 0  . loperamide (IMODIUM A-D) 2 MG tablet Take 2 mg by mouth as needed for diarrhea or loose stools.    Marland Kitchen loratadine (CLARITIN) 10 MG tablet Take 10 mg by mouth daily as needed for allergies (with chemo).    . nitroGLYCERIN (NITROSTAT) 0.4 MG SL tablet Place 1 tablet (0.4 mg total) under the tongue every 5 (five) minutes as needed for chest pain. 25 tablet 3  . ondansetron (ZOFRAN ODT) 8 MG disintegrating tablet Take 1 tablet (8 mg total) by mouth every 8 (eight) hours as needed for nausea or vomiting. 20 tablet 1  . ondansetron (ZOFRAN) 8 MG tablet TAKE 1 TABLET BY MOUTH EVERY 8 HOURS AS NEEDED FOR NAUSEA AND VOMITING 30 tablet 2  . pantoprazole (PROTONIX) 40 MG tablet Take 1 tablet (40 mg total) by mouth daily. 30 tablet 11  . prochlorperazine (COMPAZINE) 10 MG tablet Take 1 tablet (10 mg total) by mouth every 6 (six) hours as needed for nausea or vomiting. 30 tablet 0  . rOPINIRole (REQUIP) 0.5 MG tablet Take 3 tablets (1.5 mg total) by mouth at bedtime. 135 tablet 2  . sertraline (ZOLOFT) 50 MG tablet      No current facility-administered medications for this visit.    Facility-Administered Medications Ordered in Other Visits  Medication Dose Route Frequency Provider Last Rate Last Dose  . heparin lock flush 100 unit/mL  500 Units Intravenous Once Wyatt Portela, MD      . sodium chloride flush (NS) 0.9 % injection 10 mL  10 mL Intravenous PRN Wyatt Portela, MD         Allergies:  Allergies  Allergen Reactions  . Doxil [Doxorubicin Hcl Liposomal] Anaphylaxis    1st Doxil.   . Pollen Extract Other (See Comments)    Pollen and grass causes a lot sneezing    Past Medical History, Surgical history, Social history, and Family History were reviewed and updated.   Physical Exam: Blood  pressure 114/67, pulse 71, temperature 98.3 F (36.8 C), temperature source Oral, resp. rate 18, height '5\' 1"'$  (1.549 m), weight 159 lb 6.4 oz (72.3 kg), SpO2 100 %.   ECOG: 1 General appearance: Alert, awake woman without distress. Head: Normocephalic, without obvious abnormality. No oral thrush. Neck: no adenopathy or masses. Lymph nodes: Cervical, supraclavicular, and axillary nodes normal. Heart:regular rate and rhythm, S1, S2 normal, no murmur, click, rub or gallop Lung:chest clear, no wheezing, rales, normal symmetric. Chest wall examination:  Port-A-Cath site without any abnormalities. No masses or lesions. Abdomin: Soft, nontender with good bowel sounds. No shifting dullness or ascites. EXT:no erythema, induration, or nodules. No edema. Skin: No rashes or lesions. No ecchymosis.  Lab Results: Lab Results  Component Value Date   WBC 2.9 (L) 10/10/2016   HGB 9.8 (L) 10/10/2016   HCT 31.0 (L) 10/10/2016   MCV 98.9 10/10/2016   PLT 60 (L) 10/10/2016     Chemistry      Component Value Date/Time  NA 144 09/19/2016 0901   K 3.8 09/19/2016 0901   CL 104 04/16/2016 0806   CO2 26 09/19/2016 0901   BUN 13.7 09/19/2016 0901   CREATININE 0.7 09/19/2016 0901      Component Value Date/Time   CALCIUM 9.3 09/19/2016 0901   ALKPHOS 90 09/19/2016 0901   AST 13 09/19/2016 0901   ALT 9 09/19/2016 0901   BILITOT 0.28 09/19/2016 0901      Results for [name], [name] (MRN [id number]) as of 10/10/2016 10:55  Ref. Range 08/08/2016 08:49 08/29/2016 09:23 09/19/2016 09:01  Cancer Antigen (CA) 125 Latest Ref Range: 0.0 - 38.1 U/mL 24.2 26.8 27.2     Impression and Plan:  61 year old woman with the following issues:  1. Peritoneal carcinomatosis with omental involvement. The pathology confirmed the presence of adenocarcinoma of likely GYN etiology. Her CA 125 was elevated at 333.5 on 09/19/2012. She is status post systemic chemotherapy with an excellent response utilizing carboplatin and  Taxotere and a Avastin . This therapy was held in June of 2015 and currently on Avastin maintenance.   CT scan obtained on 05/05/2015 as well as her tumor markers showed clear progression. Her CA-125 is up to 189 and her CT scan showed increase in her peritoneal disease.   She is currently receiving salvage therapy with carboplatin single agent at AUC of 5 With excellent response.  CT scan obtained on 06/25/2016 Continues to show stable disease.   Her CA-125 remains relatively stable and she continues to tolerate this therapy well.   She will continue on the same dose and schedule without any delay. She will have repeat CT scan after the next cycle of treatment.  2. IV access: Port-A-Cath is without complications. No recent infection noted.  3. Thrombocytopenia: Her platelets improved since the last visit. She does not report any bleeding.  4. Abdominal distention: No ascites noted on ultrasound unlikely related to peritoneal carcinomatosis. No GI symptoms noted.  5. Left breast mass: She status post biopsy with the pathology indicating T2 N1 disease with the tumor have ER positive, PR negative HER-2 negative. Her Ki-67 is 50%.  Mammogram obtained on 03/20/2016 showed excellent response to systemic chemotherapy.   The plan is to continue with observation and surveillance for the time being given her ovarian cancer treatments.  8. Neutropenia: Neulasta will be given after a cycles of therapy.   9. Nausea: Well-controlled with antinausea medication  10. The followup: She will follow-up in 3 weeks for chemotherapy infusion.     Zola Button, MD 8/22/201811:09 AM

## 2016-10-11 ENCOUNTER — Ambulatory Visit (HOSPITAL_BASED_OUTPATIENT_CLINIC_OR_DEPARTMENT_OTHER): Payer: PPO

## 2016-10-11 DIAGNOSIS — Z5189 Encounter for other specified aftercare: Secondary | ICD-10-CM

## 2016-10-11 DIAGNOSIS — C482 Malignant neoplasm of peritoneum, unspecified: Secondary | ICD-10-CM | POA: Diagnosis not present

## 2016-10-11 LAB — CA 125: Cancer Antigen (CA) 125: 27.4 U/mL (ref 0.0–38.1)

## 2016-10-11 MED ORDER — PEGFILGRASTIM INJECTION 6 MG/0.6ML ~~LOC~~
6.0000 mg | PREFILLED_SYRINGE | Freq: Once | SUBCUTANEOUS | Status: AC
  Administered 2016-10-11: 6 mg via SUBCUTANEOUS
  Filled 2016-10-11: qty 0.6

## 2016-10-12 ENCOUNTER — Ambulatory Visit: Payer: PPO

## 2016-10-19 ENCOUNTER — Other Ambulatory Visit: Payer: Self-pay | Admitting: Family Medicine

## 2016-10-25 ENCOUNTER — Ambulatory Visit: Payer: PPO | Admitting: Family Medicine

## 2016-10-25 NOTE — Progress Notes (Signed)
HPI:   [name] is a 61 y.o. female, who is here today to follow on some chronic medical problems. She has Hx of primary peritoneal carcinomatosis Dx in 2014, breast cancer (S/P right mastectomy), left breast invasive ductal carcinoma Dx in 06/2015.She has appt with surgeon in 2 weeks, she follows q 6 months.  CAD: Follows with cardiologists every 6 months. Next appt in 11/2016. She is on Carvedilol 3.125 mg bid. Denies headache, visual changes, chest pain, dyspnea, palpitation, claudication, focal weakness, or edema.  Anxiety and depression (follows with psychiatrists every 3 months and psychotherapy weekly)   She was last seen in the office on 08/10/16 to follow on acute visit. Since her last OV she has seen her oncologists, [name].  She is receiving chemo 3 weeks and takes a week break in between. + Pancytopenia.  Lab Results  Component Value Date   WBC 2.9 (L) 10/10/2016   HGB 9.8 (L) 10/10/2016   HCT 31.0 (L) 10/10/2016   MCV 98.9 10/10/2016   PLT 60 (L) 10/10/2016    Nausea otherwise improved, it was worse when she first started chemo.  She is exercising once per week, water aerobics.  Hypothyroidism:  Currently she is on Levothyroxine 100 mcg daily.. Tolerating medication well, no side effects reported. She has not noted dysphagia, palpitations, abdominal pain, changes in bowel habits, tremor, cold/heat intolerance.  She is concerned about chemo affecting thyroid function.   Lab Results  Component Value Date   TSH 1.93 04/16/2016    Hyperlipidemia:  Currently on Lipitor 40 mg aily Following a low fat diet: Yes.  She has not noted side effects with medication.  Lab Results  Component Value Date   CHOL 120 04/16/2016   HDL 34.90 (L) 04/16/2016   LDLCALC 54 04/16/2016   LDLDIRECT 151.3 08/24/2009   TRIG 153.0 (H) 04/16/2016   CHOLHDL 3 04/16/2016    GERD: She is on Protonix 40 mg daily. Intermittent nausea and vomiting,  aggravated by chemo.  Denies abdominal pain,changes in bowel habits, blood in stool or melena.  Skin rash, tinea, recurrent. Last OV Nizoral shampoo recommended and she is reporting great improvement.   Review of Systems  Constitutional: Positive for fatigue. Negative for activity change, appetite change and fever.  HENT: Negative for mouth sores, nosebleeds and trouble swallowing.   Eyes: Negative for redness and visual disturbance.  Respiratory: Negative for cough, shortness of breath and wheezing.   Cardiovascular: Negative for chest pain, palpitations and leg swelling.  Gastrointestinal: Positive for nausea. Negative for abdominal pain and vomiting.       Negative for changes in bowel habits.  Endocrine: Negative for cold intolerance and heat intolerance.  Genitourinary: Negative for decreased urine volume, dysuria and hematuria.  Musculoskeletal: Negative for gait problem and myalgias.  Skin: Negative for pallor and wound.  Neurological: Negative for syncope, weakness and headaches.  Psychiatric/Behavioral: Negative for confusion. The patient is nervous/anxious.       Current Outpatient Prescriptions on File Prior to Visit  Medication Sig Dispense Refill  . acetaminophen (TYLENOL) 325 MG tablet Take 2 tablets (650 mg total) by mouth every 4 (four) hours as needed for mild pain, fever or headache. 30 tablet 0  . Acetylcarnitine HCl (ACETYL L-CARNITINE PO) Take 2,000 mg by mouth at bedtime.    . ALPRAZolam (XANAX) 0.5 MG tablet Take 0.5 mg by mouth 2 (two) times daily as needed for anxiety.    Marland Kitchen aspirin 81 MG chewable  tablet Chew 1 tablet (81 mg total) by mouth daily. 30 tablet 0  . atorvastatin (LIPITOR) 40 MG tablet Take 1 tablet (40 mg total) by mouth daily at 6 PM. 90 tablet 3  . carvedilol (COREG) 3.125 MG tablet Take 1/2 tablet by mouth twice daily with meals. 30 tablet 9  . clotrimazole-betamethasone (LOTRISONE) cream Apply 1 application topically 2 (two) times daily. 30 g  1  . divalproex (DEPAKOTE) 250 MG Julie tablet Take by mouth. Take 500mg s in the morning and 750mg s at night    . fluticasone (FLONASE) 50 MCG/ACT nasal spray INHALE 2 SPRAYS INTO EACH NOSTRIL EVERY NIGHT  11  . gabapentin (NEURONTIN) 100 MG capsule Take 1 capsule by mouth twice daily. 180 capsule 3  . lamoTRIgine (LAMICTAL) 100 MG tablet Take 100 mg by mouth 2 (two) times daily.    Marland Kitchen levothyroxine (SYNTHROID, LEVOTHROID) 100 MCG tablet Take 1 tablet (100 mcg total) by mouth daily before breakfast. 90 tablet 3  . lidocaine-prilocaine (EMLA) cream Apply topically as needed. Apply to port with every chemotherapy. 30 g 0  . loperamide (IMODIUM A-D) 2 MG tablet Take 2 mg by mouth as needed for diarrhea or loose stools.    Marland Kitchen loratadine (CLARITIN) 10 MG tablet Take 10 mg by mouth daily as needed for allergies (with chemo).    . nitroGLYCERIN (NITROSTAT) 0.4 MG SL tablet Place 1 tablet (0.4 mg total) under the tongue every 5 (five) minutes as needed for chest pain. 25 tablet 3  . ondansetron (ZOFRAN ODT) 8 MG disintegrating tablet Take 1 tablet (8 mg total) by mouth every 8 (eight) hours as needed for nausea or vomiting. 20 tablet 1  . ondansetron (ZOFRAN) 8 MG tablet TAKE 1 TABLET BY MOUTH EVERY 8 HOURS AS NEEDED FOR NAUSEA AND VOMITING 30 tablet 2  . pantoprazole (PROTONIX) 40 MG tablet Take 1 tablet (40 mg total) by mouth daily. 30 tablet 11  . prochlorperazine (COMPAZINE) 10 MG tablet Take 1 tablet (10 mg total) by mouth every 6 (six) hours as needed for nausea or vomiting. 30 tablet 0  . rOPINIRole (REQUIP) 0.5 MG tablet Take 3 tablets (1.5 mg total) by mouth at bedtime. 135 tablet 1  . sertraline (ZOLOFT) 50 MG tablet      Current Facility-Administered Medications on File Prior to Visit  Medication Dose Route Frequency Provider Last Rate Last Dose  . heparin lock flush 100 unit/mL  500 Units Intravenous Once Wyatt Portela, MD      . sodium chloride flush (NS) 0.9 % injection 10 mL  10 mL  Intravenous PRN Wyatt Portela, MD         Past Medical History:  Diagnosis Date  . ADD (attention deficit disorder)   . Allergy    seasonal  . Anxiety   . Bipolar disorder ([place])   . Breast cancer ([place]) 1989   right breast  . Cancer ([place])    Omentum  . Coronary artery disease   . Depression   . Hematochezia   . History of echocardiogram    a. Limited Echo 9/17: EF 60-65%, no RWMA, Gr 1 DD, trivial AI, trivial MR, GLS -12% (likely underestimated), no pericardial effusion  . Hyperlipidemia   . Hypothyroidism   . Maintenance chemotherapy    Pt has chemo every 3 weeks (on Friday)  . NSTEMI (non-ST elevated myocardial infarction) ([place]) 10/26/2015  . Osteopenia 03/2009   t score -2.1 FRAX 4.6/0.4  . Premature menopause   .  Sleep apnea    mild   Allergies  Allergen Reactions  . Doxil [Doxorubicin Hcl Liposomal] Anaphylaxis    1st Doxil.   . Pollen Extract Other (See Comments)    Pollen and grass causes a lot sneezing    Social History   Social History  . Marital status: Married    Spouse name: [name]  . Number of children: 0  . Years of education: 14   Occupational History  . unemployed [employer], [place]   Social History Main Topics  . Smoking status: Never Smoker  . Smokeless tobacco: Never Used  . Alcohol use 0.0 oz/week     Comment: very rarely - maybe 1 glass wine every 3 mos  . Drug use: No  . Sexual activity: Not Currently    Birth control/ protection: Post-menopausal     Comment: 1st intercourse 64 yo-5 partners   Other Topics Concern  . None   Social History Narrative   Lives at home with husband.    Caffeine use:  Tea/soda occass    Vitals:   10/26/16 0838  BP: 112/70  Pulse: 87  Resp: 12  SpO2: 96%   Body mass index is 29.76 kg/m.   Physical Exam  Nursing note and vitals reviewed. Constitutional: She is oriented to person, place, and time. She appears well-developed. No distress.  HENT:  Head: Normocephalic and atraumatic.   Mouth/Throat: Oropharynx is clear and moist and mucous membranes are normal.  Eyes: Pupils are equal, round, and reactive to light. Conjunctivae are normal.  Neck: No tracheal deviation present. No thyroid mass and no thyromegaly present.  Cardiovascular: Normal rate and regular rhythm.   No murmur heard. Pulses:      Dorsalis pedis pulses are 2+ on the right side, and 2+ on the left side.  Respiratory: Effort normal and breath sounds normal. No respiratory distress.  GI: Soft. She exhibits no mass. There is no hepatomegaly. There is no tenderness.  Musculoskeletal: She exhibits no edema or tenderness.  Lymphadenopathy:    She has no cervical adenopathy.  Neurological: She is alert and oriented to person, place, and time. She has normal strength. Coordination and gait normal.  Skin: Skin is warm. No erythema.  Psychiatric: Her mood appears anxious.  Well groomed, good eye contact.      ASSESSMENT AND PLAN:    [name] was seen today for follow-up.  Diagnoses and all orders for this visit:  Hypothyroidism, unspecified type  She is having labs every 3 weeks and next week is her next lab appt, so THS to be done then. Further recommendations will be given according to lab results. F/U in 12 months of thyroid function normal.   -     T4, free; Future -     TSH; Future  Gastroesophageal reflux disease without esophagitis  Stable. GERD precautions to continue. No changes in current management.  Coronary artery disease involving native coronary artery of native heart without angina pectoris  Stable. Continue following with cardiologists every 6 months. No changes in current management.  Hyperlipidemia, unspecified hyperlipidemia type  Recommend wine 2-4 Oz a few times per week to help with HDL. Low fat diet and Atorvastatin 40 mg to continue.  Pancytopenia ([place])  Stable otherwise. Denies bleeding. Caution with Aspirin. Continue following with  oncologists.   At this point given the fact her health issues are stable and she is following regularly with other providers I think I can see her annually, before if needed.  She agrees with plan.     -[name] was advised to return sooner than planned today if new concerns arise.       Johnnye Sandford G. [name], MD  [facility]. [place] office.

## 2016-10-26 ENCOUNTER — Ambulatory Visit (INDEPENDENT_AMBULATORY_CARE_PROVIDER_SITE_OTHER): Payer: PPO | Admitting: Family Medicine

## 2016-10-26 ENCOUNTER — Encounter: Payer: Self-pay | Admitting: Family Medicine

## 2016-10-26 VITALS — BP 112/70 | HR 87 | Resp 12 | Ht 61.0 in | Wt 157.5 lb

## 2016-10-26 DIAGNOSIS — E039 Hypothyroidism, unspecified: Secondary | ICD-10-CM | POA: Diagnosis not present

## 2016-10-26 DIAGNOSIS — K219 Gastro-esophageal reflux disease without esophagitis: Secondary | ICD-10-CM

## 2016-10-26 DIAGNOSIS — E785 Hyperlipidemia, unspecified: Secondary | ICD-10-CM | POA: Diagnosis not present

## 2016-10-26 DIAGNOSIS — D61818 Other pancytopenia: Secondary | ICD-10-CM

## 2016-10-26 DIAGNOSIS — I2510 Atherosclerotic heart disease of native coronary artery without angina pectoris: Secondary | ICD-10-CM | POA: Diagnosis not present

## 2016-10-26 NOTE — Patient Instructions (Addendum)
A few things to remember from today's visit:  [name], today we have followed on some of your chronic medical problems and they seem to be stable, so no changes in current management today.  Review medication list and be sure it is accurate.  -Remember a healthy diet and regular physical activity are very important for prevention as well as for well being; they also help with many chronic problems, decreasing the need of adding new medications and delaying or preventing possible complications.  I will see you back in 12 months since you are also seeing other providers q 3-6 months.   Remember to arrange your follow up appt before leaving today.  Please follow sooner than planned if a new concern arises.   Hypothyroidism, unspecified type  Gastroesophageal reflux disease without esophagitis  Coronary artery disease involving native coronary artery of native heart without angina pectoris  Hyperlipidemia, unspecified hyperlipidemia type  Flu shot needed.  Please be sure medication list is accurate. If a new problem present, please set up appointment sooner than planned today.

## 2016-10-31 ENCOUNTER — Ambulatory Visit (HOSPITAL_BASED_OUTPATIENT_CLINIC_OR_DEPARTMENT_OTHER): Payer: PPO | Admitting: Oncology

## 2016-10-31 ENCOUNTER — Telehealth: Payer: Self-pay

## 2016-10-31 ENCOUNTER — Other Ambulatory Visit: Payer: Self-pay | Admitting: Family Medicine

## 2016-10-31 ENCOUNTER — Ambulatory Visit: Payer: PPO

## 2016-10-31 ENCOUNTER — Other Ambulatory Visit (HOSPITAL_BASED_OUTPATIENT_CLINIC_OR_DEPARTMENT_OTHER): Payer: PPO

## 2016-10-31 ENCOUNTER — Ambulatory Visit (HOSPITAL_BASED_OUTPATIENT_CLINIC_OR_DEPARTMENT_OTHER): Payer: PPO

## 2016-10-31 VITALS — BP 157/24 | HR 60 | Temp 98.0°F | Resp 18 | Ht 61.0 in | Wt 158.0 lb

## 2016-10-31 DIAGNOSIS — C482 Malignant neoplasm of peritoneum, unspecified: Secondary | ICD-10-CM

## 2016-10-31 DIAGNOSIS — C50412 Malignant neoplasm of upper-outer quadrant of left female breast: Secondary | ICD-10-CM

## 2016-10-31 DIAGNOSIS — Z5111 Encounter for antineoplastic chemotherapy: Secondary | ICD-10-CM

## 2016-10-31 DIAGNOSIS — Z170 Estrogen receptor positive status [ER+]: Secondary | ICD-10-CM

## 2016-10-31 DIAGNOSIS — D696 Thrombocytopenia, unspecified: Secondary | ICD-10-CM | POA: Diagnosis not present

## 2016-10-31 DIAGNOSIS — C50919 Malignant neoplasm of unspecified site of unspecified female breast: Secondary | ICD-10-CM

## 2016-10-31 DIAGNOSIS — E039 Hypothyroidism, unspecified: Secondary | ICD-10-CM | POA: Diagnosis not present

## 2016-10-31 DIAGNOSIS — Z95828 Presence of other vascular implants and grafts: Secondary | ICD-10-CM

## 2016-10-31 LAB — CBC WITH DIFFERENTIAL/PLATELET
BASO%: 0.3 % (ref 0.0–2.0)
Basophils Absolute: 0 10*3/uL (ref 0.0–0.1)
EOS%: 0.3 % (ref 0.0–7.0)
Eosinophils Absolute: 0 10*3/uL (ref 0.0–0.5)
HCT: 30.5 % — ABNORMAL LOW (ref 34.8–46.6)
HEMOGLOBIN: 9.3 g/dL — AB (ref 11.6–15.9)
LYMPH%: 43.8 % (ref 14.0–49.7)
MCH: 31 pg (ref 25.1–34.0)
MCHC: 30.5 g/dL — ABNORMAL LOW (ref 31.5–36.0)
MCV: 101.7 fL — AB (ref 79.5–101.0)
MONO#: 0.4 10*3/uL (ref 0.1–0.9)
MONO%: 12 % (ref 0.0–14.0)
NEUT%: 43.6 % (ref 38.4–76.8)
NEUTROS ABS: 1.3 10*3/uL — AB (ref 1.5–6.5)
Platelets: 65 10*3/uL — ABNORMAL LOW (ref 145–400)
RBC: 3 10*6/uL — AB (ref 3.70–5.45)
RDW: 18 % — AB (ref 11.2–14.5)
WBC: 2.9 10*3/uL — AB (ref 3.9–10.3)
lymph#: 1.3 10*3/uL (ref 0.9–3.3)

## 2016-10-31 LAB — COMPREHENSIVE METABOLIC PANEL
ALK PHOS: 77 U/L (ref 40–150)
ALT: <6 U/L (ref 0–55)
ANION GAP: 7 meq/L (ref 3–11)
AST: 12 U/L (ref 5–34)
Albumin: 3.4 g/dL — ABNORMAL LOW (ref 3.5–5.0)
BILIRUBIN TOTAL: 0.3 mg/dL (ref 0.20–1.20)
BUN: 13.9 mg/dL (ref 7.0–26.0)
CO2: 28 meq/L (ref 22–29)
Calcium: 9 mg/dL (ref 8.4–10.4)
Chloride: 108 mEq/L (ref 98–109)
Creatinine: 0.7 mg/dL (ref 0.6–1.1)
Glucose: 106 mg/dl (ref 70–140)
POTASSIUM: 3.7 meq/L (ref 3.5–5.1)
Sodium: 142 mEq/L (ref 136–145)
TOTAL PROTEIN: 7 g/dL (ref 6.4–8.3)

## 2016-10-31 MED ORDER — PALONOSETRON HCL INJECTION 0.25 MG/5ML
0.2500 mg | Freq: Once | INTRAVENOUS | Status: AC
  Administered 2016-10-31: 0.25 mg via INTRAVENOUS

## 2016-10-31 MED ORDER — PALONOSETRON HCL INJECTION 0.25 MG/5ML
INTRAVENOUS | Status: AC
  Filled 2016-10-31: qty 5

## 2016-10-31 MED ORDER — CARBOPLATIN CHEMO INTRADERMAL TEST DOSE 100MCG/0.02ML
100.0000 ug | Freq: Once | INTRADERMAL | Status: AC
  Administered 2016-10-31: 100 ug via INTRADERMAL
  Filled 2016-10-31: qty 0.02

## 2016-10-31 MED ORDER — SODIUM CHLORIDE 0.9% FLUSH
10.0000 mL | INTRAVENOUS | Status: DC | PRN
  Administered 2016-10-31: 10 mL
  Filled 2016-10-31: qty 10

## 2016-10-31 MED ORDER — DEXAMETHASONE SODIUM PHOSPHATE 10 MG/ML IJ SOLN
4.0000 mg | Freq: Once | INTRAMUSCULAR | Status: AC
  Administered 2016-10-31: 4 mg via INTRAVENOUS

## 2016-10-31 MED ORDER — SODIUM CHLORIDE 0.9 % IV SOLN
Freq: Once | INTRAVENOUS | Status: AC
  Administered 2016-10-31: 10:00:00 via INTRAVENOUS

## 2016-10-31 MED ORDER — HEPARIN SOD (PORK) LOCK FLUSH 100 UNIT/ML IV SOLN
500.0000 [IU] | Freq: Once | INTRAVENOUS | Status: AC | PRN
  Administered 2016-10-31: 500 [IU]
  Filled 2016-10-31: qty 5

## 2016-10-31 MED ORDER — SODIUM CHLORIDE 0.9 % IJ SOLN
10.0000 mL | INTRAMUSCULAR | Status: DC | PRN
  Administered 2016-10-31: 10 mL via INTRAVENOUS
  Filled 2016-10-31: qty 10

## 2016-10-31 MED ORDER — DEXAMETHASONE SODIUM PHOSPHATE 10 MG/ML IJ SOLN
INTRAMUSCULAR | Status: AC
  Filled 2016-10-31: qty 1

## 2016-10-31 MED ORDER — SODIUM CHLORIDE 0.9 % IV SOLN
570.0000 mg | Freq: Once | INTRAVENOUS | Status: AC
  Administered 2016-10-31: 570 mg via INTRAVENOUS
  Filled 2016-10-31: qty 57

## 2016-10-31 NOTE — Telephone Encounter (Signed)
Declined avs and calender. Patient have [place] per 9/12 los.

## 2016-10-31 NOTE — Progress Notes (Signed)
Hematology and Oncology Follow Up Visit  [name] [id number] 02-10-1956 61 y.o. 10/31/2016 10:14 AM    Principle Diagnosis: 61 year old woman diagnosed with:  1. 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 This was diagnosed in July of 2014. 2. 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 The tumor is ER positive PR negative, HER-2 negative.   Prior Therapy:  She is status post paracentesis performed on 09/17/2012 with the cytology confirmed the presence of adenocarcinoma and immunohistochemical stains suggest GYN etiology. She is also status post lumpectomy for breast cancer diagnosed 25 years ago followed by radiation and chemotherapy under the care of Dr. Sonny Dandy. She did not receive any hormonal therapy. Chemotherapy utilizing carboplatin and Taxotere started on 10/01/2012. Avastin was added with cycle 4. This was discontinued in June of 2015. She is S/P Avastin maintenance only between June 2015 till March 2017. Therapy discontinued due to progression of disease. She is status post Doxil salvage chemotherapy therapy discontinued because of hypersensitivity reaction in March 2017.  Current therapy:  Carboplatin and AUC of 5 every 3 weeks cycle 1 started on 07/08/2015. She is here for her next cycle of therapy.  Interim History: [name] presents today for a followup visit with her husband. Since her last visit, she reports no major changes in her health. She reports her appetite slightly decreased although her weight is relatively stable. She does have some mild fatigue which is unchanged. She denied any infusion-related complications to chemotherapy. She denied any nausea, vomiting or bleeding. She does not report any abdominal pain or early satiety. She denied any petechiae or bleeding. She still ambulating without any falls or syncope. She continues to  enjoy excellent quality of life including seen in the choir.  She does not report any headaches, blurry vision, syncope or seizures. She denied any fever chills. She does report some chest discomfort but no dyspnea on exertion. Has not reported any shortness of breath or cough. She denied any pain with swallowing. She does not report any vomiting, constipation does report some occasional diarrhea. She denied any GYN bleeding. She reports no anxiety or depression. Remainder of the review of systems is unremarkable.  Medications: I have reviewed the patient's current medications.  Current Outpatient Prescriptions  Medication Sig Dispense Refill  . acetaminophen (TYLENOL) 325 MG tablet Take 2 tablets (650 mg total) by mouth every 4 (four) hours as needed for mild pain, fever or headache. 30 tablet 0  . Acetylcarnitine HCl (ACETYL L-CARNITINE PO) Take 2,000 mg by mouth at bedtime.    . ALPRAZolam (XANAX) 0.5 MG tablet Take 0.5 mg by mouth 2 (two) times daily as needed for anxiety.    Marland Kitchen aspirin 81 MG chewable tablet Chew 1 tablet (81 mg total) by mouth daily. 30 tablet 0  . atorvastatin (LIPITOR) 40 MG tablet Take 1 tablet (40 mg total) by mouth daily at 6 PM. 90 tablet 3  . carvedilol (COREG) 3.125 MG tablet Take 1/2 tablet by mouth twice daily with meals. 30 tablet 9  . clotrimazole-betamethasone (LOTRISONE) cream Apply 1 application topically 2 (two) times daily. 30 g 1  . divalproex (DEPAKOTE) 250 MG DR tablet Take by mouth. Take 522ms in the morning and 7549m at night    . fluticasone (FLONASE) 50 MCG/ACT nasal spray INHALE 2 SPRAYS INTO EACH NOSTRIL EVERY NIGHT  11  . gabapentin (NEURONTIN) 100  MG capsule Take 1 capsule by mouth twice daily. 180 capsule 3  . lamoTRIgine (LAMICTAL) 100 MG tablet Take 100 mg by mouth 2 (two) times daily.    Marland Kitchen levothyroxine (SYNTHROID, LEVOTHROID) 100 MCG tablet Take 1 tablet (100 mcg total) by mouth daily before breakfast. 90 tablet 3  . lidocaine-prilocaine  (EMLA) cream Apply topically as needed. Apply to port with every chemotherapy. 30 g 0  . loperamide (IMODIUM A-D) 2 MG tablet Take 2 mg by mouth as needed for diarrhea or loose stools.    Marland Kitchen loratadine (CLARITIN) 10 MG tablet Take 10 mg by mouth daily as needed for allergies (with chemo).    . nitroGLYCERIN (NITROSTAT) 0.4 MG SL tablet Place 1 tablet (0.4 mg total) under the tongue every 5 (five) minutes as needed for chest pain. 25 tablet 3  . ondansetron (ZOFRAN ODT) 8 MG disintegrating tablet Take 1 tablet (8 mg total) by mouth every 8 (eight) hours as needed for nausea or vomiting. 20 tablet 1  . ondansetron (ZOFRAN) 8 MG tablet TAKE 1 TABLET BY MOUTH EVERY 8 HOURS AS NEEDED FOR NAUSEA AND VOMITING 30 tablet 2  . pantoprazole (PROTONIX) 40 MG tablet Take 1 tablet (40 mg total) by mouth daily. 30 tablet 11  . prochlorperazine (COMPAZINE) 10 MG tablet Take 1 tablet (10 mg total) by mouth every 6 (six) hours as needed for nausea or vomiting. 30 tablet 0  . rOPINIRole (REQUIP) 0.5 MG tablet Take 3 tablets (1.5 mg total) by mouth at bedtime. 135 tablet 1  . sertraline (ZOLOFT) 50 MG tablet      No current facility-administered medications for this visit.    Facility-Administered Medications Ordered in Other Visits  Medication Dose Route Frequency Provider Last Rate Last Dose  . heparin lock flush 100 unit/mL  500 Units Intravenous Once Wyatt Portela, MD      . sodium chloride flush (NS) 0.9 % injection 10 mL  10 mL Intravenous PRN Wyatt Portela, MD         Allergies:  Allergies  Allergen Reactions  . Doxil [Doxorubicin Hcl Liposomal] Anaphylaxis    1st Doxil.   . Pollen Extract Other (See Comments)    Pollen and grass causes a lot sneezing    Past Medical History, Surgical history, Social history, and Family History were reviewed and updated.   Physical Exam: Blood pressure (!) 157/24, pulse 60, temperature 98 F (36.7 C), temperature source Oral, resp. rate 18, height _0   (1.549 m), weight 158 lb (71.7 kg), SpO2 100 %.   ECOG: 1 General appearance: Well-appearing woman without distress. Head: Normocephalic, without obvious abnormality. No oral ulcers or lesions. Neck: no adenopathy or masses. Lymph nodes: Cervical, supraclavicular, and axillary nodes normal. Heart:regular rate and rhythm, S1, S2 normal, no murmur, click, rub or gallop Lung:chest clear, no wheezing, rales, normal symmetric. Chest wall examination:  Port-A-Cath site without any abnormalities or erythema. Abdomin: Soft, nontender with good bowel sounds. No rebound or guarding. EXT:no erythema, induration, or nodules. No edema. Skin: No rashes or lesions. No ecchymosis.  Lab Results: Lab Results  Component Value Date   WBC 2.9 (L) 10/31/2016   HGB 9.3 (L) 10/31/2016   HCT 30.5 (L) 10/31/2016   MCV 101.7 (H) 10/31/2016   PLT 65 (L) 10/31/2016     Chemistry      Component Value Date/Time   NA 142 10/10/2016 0952   K 3.9 10/10/2016 0952   CL 104 04/16/2016 0806   CO2  28 10/10/2016 0952   BUN 9.2 10/10/2016 0952   CREATININE 0.7 10/10/2016 0952      Component Value Date/Time   CALCIUM 9.0 10/10/2016 0952   ALKPHOS 89 10/10/2016 0952   AST 11 10/10/2016 0952   ALT 8 10/10/2016 0952   BILITOT 0.22 10/10/2016 0952       Results for LEIANA, RUND (MRN [id number]) as of 10/31/2016 10:16  Ref. Range 08/29/2016 09:23 09/19/2016 09:01 10/10/2016 09:52  Cancer Antigen (CA) 125 Latest Ref Range: 0.0 - 38.1 U/mL 26.8 27.2 27.4     Impression and Plan:  61 year old woman with the following issues:  1. Peritoneal carcinomatosis with omental involvement. The pathology confirmed the presence of adenocarcinoma of likely GYN etiology. Her CA 125 was elevated at 333.5 on 09/19/2012. She is status post systemic chemotherapy with an excellent response utilizing carboplatin and Taxotere and a Avastin . This therapy was held in June of 2015 and currently on Avastin maintenance.   CT scan  obtained on 05/05/2015 as well as her tumor markers showed clear progression. Her CA-125 is up to 189 and her CT scan showed increase in her peritoneal disease.   She is currently receiving salvage therapy with carboplatin single agent at AUC of 5 With excellent response.  CT scan obtained on 06/25/2016 Continues to show stable disease.   Her CA-125 is stable without any major changes.  Risks and benefits of continuing this treatment was discussed today and she is agreeable to continue. The plan is to repeat imaging studies in November 2018.  2. IV access: Port-A-Cath is without complications. No recent infection noted.  3. Thrombocytopenia: Her platelets improved since the last visit. No bleeding noted she will continue to be monitored.  4. Abdominal distention: No ascites noted on ultrasound unlikely related to peritoneal carcinomatosis. No GI symptoms noted.  5. Left breast mass: She status post biopsy with the pathology indicating T2 N1 disease with the tumor have ER positive, PR negative HER-2 negative. Her Ki-67 is 50%.  Mammogram obtained on 03/20/2016 showed excellent response to systemic chemotherapy. We'll continue to follow with periodic mammography.  8. Neutropenia: Neulasta will be given after a cycles of therapy.   9. Nausea: Well-controlled with antinausea medication  10. The followup: She will follow-up in 3 weeks for chemotherapy infusion.     Zola Button, MD 9/12/201810:14 AM

## 2016-10-31 NOTE — Patient Instructions (Signed)

## 2016-10-31 NOTE — Patient Instructions (Signed)
[facility] Discharge Instructions for Patients Receiving Chemotherapy  Today you received the following chemotherapy agents: Carboplatin   To help prevent nausea and vomiting after your treatment, we encourage you to take your nausea medication as directed.    If you develop nausea and vomiting that is not controlled by your nausea medication, call the clinic.   BELOW ARE SYMPTOMS THAT SHOULD BE REPORTED IMMEDIATELY:  *FEVER GREATER THAN 100.5 F  *CHILLS WITH OR WITHOUT FEVER  NAUSEA AND VOMITING THAT IS NOT CONTROLLED WITH YOUR NAUSEA MEDICATION  *UNUSUAL SHORTNESS OF BREATH  *UNUSUAL BRUISING OR BLEEDING  TENDERNESS IN MOUTH AND THROAT WITH OR WITHOUT PRESENCE OF ULCERS  *URINARY PROBLEMS  *BOWEL PROBLEMS  UNUSUAL RASH Items with * indicate a potential emergency and should be followed up as soon as possible.  Feel free to call the clinic you have any questions or concerns. The clinic phone number is [phone].  Please show the CHEMO ALERT CARD at check-in to the Emergency Department and triage nurse.   

## 2016-11-01 ENCOUNTER — Ambulatory Visit (HOSPITAL_BASED_OUTPATIENT_CLINIC_OR_DEPARTMENT_OTHER): Payer: PPO

## 2016-11-01 VITALS — BP 113/63 | HR 55 | Temp 98.5°F

## 2016-11-01 DIAGNOSIS — Z5189 Encounter for other specified aftercare: Secondary | ICD-10-CM

## 2016-11-01 DIAGNOSIS — C482 Malignant neoplasm of peritoneum, unspecified: Secondary | ICD-10-CM

## 2016-11-01 LAB — CA 125: Cancer Antigen (CA) 125: 24.8 U/mL (ref 0.0–38.1)

## 2016-11-01 MED ORDER — PEGFILGRASTIM INJECTION 6 MG/0.6ML ~~LOC~~
6.0000 mg | PREFILLED_SYRINGE | Freq: Once | SUBCUTANEOUS | Status: AC
  Administered 2016-11-01: 6 mg via SUBCUTANEOUS
  Filled 2016-11-01: qty 0.6

## 2016-11-02 ENCOUNTER — Ambulatory Visit: Payer: PPO

## 2016-11-06 ENCOUNTER — Encounter: Payer: Self-pay | Admitting: Family Medicine

## 2016-11-06 ENCOUNTER — Encounter: Payer: Self-pay | Admitting: Oncology

## 2016-11-06 ENCOUNTER — Other Ambulatory Visit: Payer: Self-pay

## 2016-11-06 LAB — TSH
TSH: 1.55 (ref 0.41–5.90)
TSH: 1.55 u[IU]/mL (ref 0.450–4.500)

## 2016-11-06 LAB — T4, FREE: Free T4: 1.62 ng/dL (ref 0.82–1.77)

## 2016-11-06 MED ORDER — PANTOPRAZOLE SODIUM 40 MG PO TBEC: 40.0000 mg | DELAYED_RELEASE_TABLET | Freq: Every day | ORAL | 1 refills | Status: DC

## 2016-11-07 ENCOUNTER — Encounter: Payer: Self-pay | Admitting: Oncology

## 2016-11-08 ENCOUNTER — Encounter: Payer: Self-pay | Admitting: Family Medicine

## 2016-11-08 DIAGNOSIS — C50912 Malignant neoplasm of unspecified site of left female breast: Secondary | ICD-10-CM | POA: Diagnosis not present

## 2016-11-12 ENCOUNTER — Encounter: Payer: Self-pay | Admitting: *Deleted

## 2016-11-21 ENCOUNTER — Telehealth: Payer: Self-pay | Admitting: Oncology

## 2016-11-21 ENCOUNTER — Ambulatory Visit (HOSPITAL_BASED_OUTPATIENT_CLINIC_OR_DEPARTMENT_OTHER): Payer: PPO | Admitting: Oncology

## 2016-11-21 ENCOUNTER — Ambulatory Visit (HOSPITAL_BASED_OUTPATIENT_CLINIC_OR_DEPARTMENT_OTHER): Payer: PPO

## 2016-11-21 VITALS — BP 118/62 | HR 63 | Temp 98.1°F | Resp 18 | Ht 61.0 in | Wt 155.8 lb

## 2016-11-21 DIAGNOSIS — Z170 Estrogen receptor positive status [ER+]: Principal | ICD-10-CM

## 2016-11-21 DIAGNOSIS — C482 Malignant neoplasm of peritoneum, unspecified: Secondary | ICD-10-CM

## 2016-11-21 DIAGNOSIS — Z5111 Encounter for antineoplastic chemotherapy: Secondary | ICD-10-CM

## 2016-11-21 DIAGNOSIS — D696 Thrombocytopenia, unspecified: Secondary | ICD-10-CM

## 2016-11-21 DIAGNOSIS — Z95828 Presence of other vascular implants and grafts: Secondary | ICD-10-CM

## 2016-11-21 DIAGNOSIS — C50412 Malignant neoplasm of upper-outer quadrant of left female breast: Secondary | ICD-10-CM | POA: Diagnosis not present

## 2016-11-21 DIAGNOSIS — C50919 Malignant neoplasm of unspecified site of unspecified female breast: Secondary | ICD-10-CM

## 2016-11-21 LAB — CBC WITH DIFFERENTIAL/PLATELET
BASO%: 0.4 % (ref 0.0–2.0)
BASOS ABS: 0 10*3/uL (ref 0.0–0.1)
EOS ABS: 0 10*3/uL (ref 0.0–0.5)
EOS%: 0.7 % (ref 0.0–7.0)
HEMATOCRIT: 30.3 % — AB (ref 34.8–46.6)
HEMOGLOBIN: 9.9 g/dL — AB (ref 11.6–15.9)
LYMPH#: 1.1 10*3/uL (ref 0.9–3.3)
LYMPH%: 40.1 % (ref 14.0–49.7)
MCH: 32.6 pg (ref 25.1–34.0)
MCHC: 32.6 g/dL (ref 31.5–36.0)
MCV: 100.1 fL (ref 79.5–101.0)
MONO#: 0.3 10*3/uL (ref 0.1–0.9)
MONO%: 10.2 % (ref 0.0–14.0)
NEUT#: 1.3 10*3/uL — ABNORMAL LOW (ref 1.5–6.5)
NEUT%: 48.6 % (ref 38.4–76.8)
PLATELETS: 76 10*3/uL — AB (ref 145–400)
RBC: 3.02 10*6/uL — ABNORMAL LOW (ref 3.70–5.45)
RDW: 17.4 % — AB (ref 11.2–14.5)
WBC: 2.7 10*3/uL — ABNORMAL LOW (ref 3.9–10.3)

## 2016-11-21 LAB — COMPREHENSIVE METABOLIC PANEL
ALBUMIN: 3.6 g/dL (ref 3.5–5.0)
ALK PHOS: 94 U/L (ref 40–150)
ALT: <6 U/L (ref 0–55)
ANION GAP: 8 meq/L (ref 3–11)
AST: 12 U/L (ref 5–34)
BUN: 10.5 mg/dL (ref 7.0–26.0)
CALCIUM: 9.4 mg/dL (ref 8.4–10.4)
CO2: 27 mEq/L (ref 22–29)
Chloride: 106 mEq/L (ref 98–109)
Creatinine: 0.7 mg/dL (ref 0.6–1.1)
Glucose: 108 mg/dl (ref 70–140)
POTASSIUM: 4.3 meq/L (ref 3.5–5.1)
Sodium: 141 mEq/L (ref 136–145)
Total Bilirubin: 0.25 mg/dL (ref 0.20–1.20)
Total Protein: 7.6 g/dL (ref 6.4–8.3)

## 2016-11-21 MED ORDER — DEXAMETHASONE SODIUM PHOSPHATE 10 MG/ML IJ SOLN
INTRAMUSCULAR | Status: AC
Start: 2016-11-21 — End: 2016-11-21
  Filled 2016-11-21: qty 1

## 2016-11-21 MED ORDER — PALONOSETRON HCL INJECTION 0.25 MG/5ML
0.2500 mg | Freq: Once | INTRAVENOUS | Status: AC
  Administered 2016-11-21: 0.25 mg via INTRAVENOUS

## 2016-11-21 MED ORDER — HEPARIN SOD (PORK) LOCK FLUSH 100 UNIT/ML IV SOLN
500.0000 [IU] | Freq: Once | INTRAVENOUS | Status: DC | PRN
  Filled 2016-11-21: qty 5

## 2016-11-21 MED ORDER — PALONOSETRON HCL INJECTION 0.25 MG/5ML
INTRAVENOUS | Status: AC
  Filled 2016-11-21: qty 5

## 2016-11-21 MED ORDER — SODIUM CHLORIDE 0.9 % IV SOLN
570.0000 mg | Freq: Once | INTRAVENOUS | Status: AC
  Administered 2016-11-21: 570 mg via INTRAVENOUS
  Filled 2016-11-21: qty 57

## 2016-11-21 MED ORDER — CARBOPLATIN CHEMO INTRADERMAL TEST DOSE 100MCG/0.02ML
100.0000 ug | Freq: Once | INTRADERMAL | Status: AC
  Administered 2016-11-21: 100 ug via INTRADERMAL
  Filled 2016-11-21: qty 0.02

## 2016-11-21 MED ORDER — SODIUM CHLORIDE 0.9% FLUSH
10.0000 mL | INTRAVENOUS | Status: DC | PRN
  Filled 2016-11-21: qty 10

## 2016-11-21 MED ORDER — SODIUM CHLORIDE 0.9 % IJ SOLN
10.0000 mL | INTRAMUSCULAR | Status: DC | PRN
  Administered 2016-11-21: 10 mL via INTRAVENOUS
  Filled 2016-11-21: qty 10

## 2016-11-21 MED ORDER — SODIUM CHLORIDE 0.9 % IV SOLN
Freq: Once | INTRAVENOUS | Status: AC
  Administered 2016-11-21: 11:00:00 via INTRAVENOUS

## 2016-11-21 MED ORDER — DEXAMETHASONE SODIUM PHOSPHATE 10 MG/ML IJ SOLN
4.0000 mg | Freq: Once | INTRAMUSCULAR | Status: AC
  Administered 2016-11-21: 4 mg via INTRAVENOUS

## 2016-11-21 NOTE — Patient Instructions (Signed)
[facility] Discharge Instructions for Patients Receiving Chemotherapy  Today you received the following chemotherapy agents: Carboplatin.  To help prevent nausea and vomiting after your treatment, we encourage you to take your nausea medication as prescribed.   If you develop nausea and vomiting that is not controlled by your nausea medication, call the clinic.   BELOW ARE SYMPTOMS THAT SHOULD BE REPORTED IMMEDIATELY:  *FEVER GREATER THAN 100.5 F  *CHILLS WITH OR WITHOUT FEVER  NAUSEA AND VOMITING THAT IS NOT CONTROLLED WITH YOUR NAUSEA MEDICATION  *UNUSUAL SHORTNESS OF BREATH  *UNUSUAL BRUISING OR BLEEDING  TENDERNESS IN MOUTH AND THROAT WITH OR WITHOUT PRESENCE OF ULCERS  *URINARY PROBLEMS  *BOWEL PROBLEMS  UNUSUAL RASH Items with * indicate a potential emergency and should be followed up as soon as possible.  Feel free to call the clinic should you have any questions or concerns. The clinic phone number is [phone].  Please show the CHEMO ALERT CARD at check-in to the Emergency Department and triage nurse.     

## 2016-11-21 NOTE — Telephone Encounter (Signed)
Updated schedule per 10/3 los

## 2016-11-21 NOTE — Progress Notes (Signed)
Hematology and Oncology Follow Up Visit  AI SONNENFELD [id number] 04-Oct-1955 61 y.o. 11/21/2016 10:37 AM    Principle Diagnosis: 61 year old woman diagnosed with:  1. 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 This was diagnosed in July of 2014. 2. 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 The tumor is ER positive PR negative, HER-2 negative.   Prior Therapy:  She is status post paracentesis performed on 09/17/2012 with the cytology confirmed the presence of adenocarcinoma and immunohistochemical stains suggest GYN etiology. She is also status post lumpectomy for breast cancer diagnosed 25 years ago followed by radiation and chemotherapy under the care of Dr. Sonny Dandy. She did not receive any hormonal therapy. Chemotherapy utilizing carboplatin and Taxotere started on 10/01/2012. Avastin was added with cycle 4. This was discontinued in June of 2015. She is S/P Avastin maintenance only between June 2015 till March 2017. Therapy discontinued due to progression of disease. She is status post Doxil salvage chemotherapy therapy discontinued because of hypersensitivity reaction in March 2017.  Current therapy:  Carboplatin and AUC of 5 every 3 weeks cycle 1 started on 07/08/2015. She is here for her next cycle of therapy.  Interim History: Mrs. Starlin presents today for a followup visit with her husband. Since her last visit, she reports periodic fatigue but no dramatic changes. She is struggling to recover from her chemotherapy to attend church services on Sunday. She is requesting to move her chemotherapy to Mondays to recover in time.   She denied any infusion-related complications to chemotherapy. She denied any vomiting or bleeding. She does have mild nausea that is managed by Zofran. She does not report any abdominal pain or early satiety. She denied any  petechiae or bleeding. She still ambulating without any falls or syncope. Her quality of life is not dramatically changed.  She does not report any headaches, blurry vision, syncope or seizures. She denied any fever chills. She does report some chest discomfort but no dyspnea on exertion. Has not reported any shortness of breath or cough. She denied any pain with swallowing. She does not report any vomiting, constipation does report some occasional diarrhea. She denied any GYN bleeding. She reports no anxiety or depression. Remainder of the review of systems is unremarkable.  Medications: I have reviewed the patient's current medications.  Current Outpatient Prescriptions  Medication Sig Dispense Refill  . acetaminophen (TYLENOL) 325 MG tablet Take 2 tablets (650 mg total) by mouth every 4 (four) hours as needed for mild pain, fever or headache. 30 tablet 0  . Acetylcarnitine HCl (ACETYL L-CARNITINE PO) Take 2,000 mg by mouth at bedtime.    . ALPRAZolam (XANAX) 0.5 MG tablet Take 0.5 mg by mouth 2 (two) times daily as needed for anxiety.    Marland Kitchen aspirin 81 MG chewable tablet Chew 1 tablet (81 mg total) by mouth daily. 30 tablet 0  . atorvastatin (LIPITOR) 40 MG tablet Take 1 tablet (40 mg total) by mouth daily at 6 PM. 90 tablet 3  . carvedilol (COREG) 3.125 MG tablet Take 1/2 tablet by mouth twice daily with meals. 30 tablet 9  . clotrimazole-betamethasone (LOTRISONE) cream Apply 1 application topically 2 (two) times daily. 30 g 1  . divalproex (DEPAKOTE) 250 MG DR tablet Take by mouth. Take '500mg'$ s in the morning and '750mg'$ s at night    . fluticasone (FLONASE) 50 MCG/ACT nasal spray INHALE 2 SPRAYS  INTO EACH NOSTRIL EVERY NIGHT  11  . gabapentin (NEURONTIN) 100 MG capsule Take 1 capsule by mouth twice daily. 180 capsule 3  . lamoTRIgine (LAMICTAL) 100 MG tablet Take 100 mg by mouth 2 (two) times daily.    Marland Kitchen levothyroxine (SYNTHROID, LEVOTHROID) 100 MCG tablet Take 1 tablet (100 mcg total) by mouth  daily before breakfast. 90 tablet 3  . lidocaine-prilocaine (EMLA) cream Apply topically as needed. Apply to port with every chemotherapy. 30 g 0  . loperamide (IMODIUM A-D) 2 MG tablet Take 2 mg by mouth as needed for diarrhea or loose stools.    Marland Kitchen loratadine (CLARITIN) 10 MG tablet Take 10 mg by mouth daily as needed for allergies (with chemo).    . nitroGLYCERIN (NITROSTAT) 0.4 MG SL tablet Place 1 tablet (0.4 mg total) under the tongue every 5 (five) minutes as needed for chest pain. 25 tablet 3  . ondansetron (ZOFRAN ODT) 8 MG disintegrating tablet Take 1 tablet (8 mg total) by mouth every 8 (eight) hours as needed for nausea or vomiting. 20 tablet 1  . ondansetron (ZOFRAN) 8 MG tablet TAKE 1 TABLET BY MOUTH EVERY 8 HOURS AS NEEDED FOR NAUSEA AND VOMITING 30 tablet 2  . pantoprazole (PROTONIX) 40 MG tablet Take 1 tablet (40 mg total) by mouth daily. 90 tablet 1  . prochlorperazine (COMPAZINE) 10 MG tablet Take 1 tablet (10 mg total) by mouth every 6 (six) hours as needed for nausea or vomiting. 30 tablet 0  . rOPINIRole (REQUIP) 0.5 MG tablet Take 3 tablets (1.5 mg total) by mouth at bedtime. 135 tablet 1  . sertraline (ZOLOFT) 50 MG tablet      No current facility-administered medications for this visit.    Facility-Administered Medications Ordered in Other Visits  Medication Dose Route Frequency Provider Last Rate Last Dose  . heparin lock flush 100 unit/mL  500 Units Intravenous Once Wyatt Portela, MD      . sodium chloride flush (NS) 0.9 % injection 10 mL  10 mL Intravenous PRN Wyatt Portela, MD         Allergies:  Allergies  Allergen Reactions  . Doxil [Doxorubicin Hcl Liposomal] Anaphylaxis    1st Doxil.   . Pollen Extract Other (See Comments)    Pollen and grass causes a lot sneezing    Past Medical History, Surgical history, Social history, and Family History were reviewed and updated.   Physical Exam: Blood pressure 118/62, pulse 63, temperature 98.1 F (36.7 C),  temperature source Oral, resp. rate 18, height '5\' 1"'$  (1.549 m), weight 155 lb 12.8 oz (70.7 kg), SpO2 100 %.  ECOG: 1 General appearance: Alert, awake woman without distress. Head: Normocephalic, without obvious abnormality. No oral thrush or ulcers. Neck: no adenopathy or masses. Lymph nodes: Cervical, supraclavicular, and axillary nodes normal. Heart:regular rate and rhythm, S1, S2 normal, no murmur, click, rub or gallop Lung:chest clear, no wheezing, rales, normal symmetric. Chest wall examination:  Port-A-Cath site without any abnormalities or erythema. Abdomin: Soft, nontender with good bowel sounds. No shifting dullness or ascites. EXT:no erythema, induration, or nodules. No edema. Skin: No rashes or lesions. No ecchymosis.  Lab Results: Lab Results  Component Value Date   WBC 2.7 (L) 11/21/2016   HGB 9.9 (L) 11/21/2016   HCT 30.3 (L) 11/21/2016   MCV 100.1 11/21/2016   PLT 76 (L) 11/21/2016     Chemistry      Component Value Date/Time   NA 141 11/21/2016 3559  K 4.3 11/21/2016 0937   CL 104 04/16/2016 0806   CO2 27 11/21/2016 0937   BUN 10.5 11/21/2016 0937   CREATININE 0.7 11/21/2016 0937      Component Value Date/Time   CALCIUM 9.4 11/21/2016 0937   ALKPHOS 94 11/21/2016 0937   AST 12 11/21/2016 0937   ALT <6 11/21/2016 0937   BILITOT 0.25 11/21/2016 0937       Results for ICIS, BUDREAU (MRN [id number]) as of 11/21/2016 10:01  Ref. Range 09/19/2016 09:01 10/10/2016 09:52 10/31/2016 09:28  Cancer Antigen (CA) 125 Latest Ref Range: 0.0 - 38.1 U/mL 27.2 27.4 24.8      Impression and Plan:  61 year old woman with the following issues:  1. Peritoneal carcinomatosis with omental involvement. The pathology confirmed the presence of adenocarcinoma of likely GYN etiology. Her CA 125 was elevated at 333.5 on 09/19/2012. She is status post systemic chemotherapy with an excellent response utilizing carboplatin and Taxotere and a Avastin . This therapy was held in  June of 2015 and currently on Avastin maintenance.   CT scan obtained on 05/05/2015 as well as her tumor markers showed clear progression. Her CA-125 is up to 189 and her CT scan showed increase in her peritoneal disease.   She is currently receiving salvage therapy with carboplatin single agent at AUC of 5 With excellent response.  CT scan obtained on 06/25/2016 Continues to show stable disease.   Her CA-125 slightly declined but overall stable.  She is agreeable to continue with the same dose and schedule and will move her appointments to Mondays to accommodate her request. Her next chemotherapy will be on 12/10/2016 and repeated on November 12. We will obtain a CT scan of the chest abdomen and pelvis for staging purposes before her next November appointment.  2. IV access: Port-A-Cath is without complications. No recent infection noted.  3. Thrombocytopenia: Her platelets improved since the last visit. No bleeding reported since the last visit.  4. Abdominal distention: No ascites noted on ultrasound unlikely related to peritoneal carcinomatosis. No GI symptoms noted.  5. Left breast mass: She status post biopsy with the pathology indicating T2 N1 disease with the tumor have ER positive, PR negative HER-2 negative. Her Ki-67 is 50%.  Mammogram obtained on 03/20/2016 showed excellent response to systemic chemotherapy. We'll continue to follow with periodic mammography.  8. Neutropenia: Neulasta will be given after a cycles of therapy.   9. Nausea: Well-controlled with Zofran.  10. Followup: She will follow-up in 3 weeks for chemotherapy infusion.     Zola Button, MD 10/3/201810:37 AM

## 2016-11-21 NOTE — Patient Instructions (Signed)
[facility] Discharge Instructions for Patients Receiving Chemotherapy  Today you received the following chemotherapy agents:  carboplatin  To help prevent nausea and vomiting after your treatment, we encourage you to take your nausea medication as prescribed.    If you develop nausea and vomiting that is not controlled by your nausea medication, call the clinic.   BELOW ARE SYMPTOMS THAT SHOULD BE REPORTED IMMEDIATELY:  *FEVER GREATER THAN 100.5 F  *CHILLS WITH OR WITHOUT FEVER  NAUSEA AND VOMITING THAT IS NOT CONTROLLED WITH YOUR NAUSEA MEDICATION  *UNUSUAL SHORTNESS OF BREATH  *UNUSUAL BRUISING OR BLEEDING  TENDERNESS IN MOUTH AND THROAT WITH OR WITHOUT PRESENCE OF ULCERS  *URINARY PROBLEMS  *BOWEL PROBLEMS  UNUSUAL RASH Items with * indicate a potential emergency and should be followed up as soon as possible.  Feel free to call the clinic should you have any questions or concerns. The clinic phone number is [phone].  Please show the CHEMO ALERT CARD at check-in to the Emergency Department and triage nurse.   

## 2016-11-21 NOTE — Progress Notes (Signed)
Ok to treat platelets 76 per Dr. Alen Blew.  Cyndia Bent RN

## 2016-11-21 NOTE — Patient Instructions (Signed)
Implanted Port Home Guide An implanted port is a type of central line that is placed under the skin. Central lines are used to provide IV access when treatment or nutrition needs to be given through a person's veins. Implanted ports are used for long-term IV access. An implanted port may be placed because:  You need IV medicine that would be irritating to the small veins in your hands or arms.  You need long-term IV medicines, such as antibiotics.  You need IV nutrition for a long period.  You need frequent blood draws for lab tests.  You need dialysis.  Implanted ports are usually placed in the chest area, but they can also be placed in the upper arm, the abdomen, or the leg. An implanted port has two main parts:  Reservoir. The reservoir is round and will appear as a small, raised area under your skin. The reservoir is the part where a needle is inserted to give medicines or draw blood.  Catheter. The catheter is a thin, flexible tube that extends from the reservoir. The catheter is placed into a large vein. Medicine that is inserted into the reservoir goes into the catheter and then into the vein.  How will I care for my incision site? Do not get the incision site wet. Bathe or shower as directed by your health care provider. How is my port accessed? Special steps must be taken to access the port:  Before the port is accessed, a numbing cream can be placed on the skin. This helps numb the skin over the port site.  Your health care provider uses a sterile technique to access the port. ? Your health care provider must put on a mask and sterile gloves. ? The skin over your port is cleaned carefully with an antiseptic and allowed to dry. ? The port is gently pinched between sterile gloves, and a needle is inserted into the port.  Only "non-coring" port needles should be used to access the port. Once the port is accessed, a blood return should be checked. This helps ensure that the port  is in the vein and is not clogged.  If your port needs to remain accessed for a constant infusion, a clear (transparent) bandage will be placed over the needle site. The bandage and needle will need to be changed every week, or as directed by your health care provider.  Keep the bandage covering the needle clean and dry. Do not get it wet. Follow your health care provider's instructions on how to take a shower or bath while the port is accessed.  If your port does not need to stay accessed, no bandage is needed over the port.  What is flushing? Flushing helps keep the port from getting clogged. Follow your health care provider's instructions on how and when to flush the port. Ports are usually flushed with saline solution or a medicine called heparin. The need for flushing will depend on how the port is used.  If the port is used for intermittent medicines or blood draws, the port will need to be flushed: ? After medicines have been given. ? After blood has been drawn. ? As part of routine maintenance.  If a constant infusion is running, the port may not need to be flushed.  How long will my port stay implanted? The port can stay in for as long as your health care provider thinks it is needed. When it is time for the port to come out, surgery will be   done to remove it. The procedure is similar to the one performed when the port was put in. When should I seek immediate medical care? When you have an implanted port, you should seek immediate medical care if:  You notice a bad smell coming from the incision site.  You have swelling, redness, or drainage at the incision site.  You have more swelling or pain at the port site or the surrounding area.  You have a fever that is not controlled with medicine.  This information is not intended to replace advice given to you by your health care provider. Make sure you discuss any questions you have with your health care provider. Document  Released: 02/05/2005 Document Revised: 07/14/2015 Document Reviewed: 10/13/2012 Elsevier Interactive Patient Education  2017 [employer]  

## 2016-11-21 NOTE — Progress Notes (Signed)
Ok to treat with Neut 1.3 per Dr. Alen Blew.  Cyndia Bent RN

## 2016-11-22 ENCOUNTER — Ambulatory Visit (HOSPITAL_BASED_OUTPATIENT_CLINIC_OR_DEPARTMENT_OTHER): Payer: PPO

## 2016-11-22 VITALS — BP 127/76 | HR 62 | Temp 97.9°F | Resp 20

## 2016-11-22 DIAGNOSIS — C482 Malignant neoplasm of peritoneum, unspecified: Secondary | ICD-10-CM | POA: Diagnosis not present

## 2016-11-22 DIAGNOSIS — Z5189 Encounter for other specified aftercare: Secondary | ICD-10-CM | POA: Diagnosis not present

## 2016-11-22 LAB — CA 125: CANCER ANTIGEN (CA) 125: 26.3 U/mL (ref 0.0–38.1)

## 2016-11-22 MED ORDER — PEGFILGRASTIM INJECTION 6 MG/0.6ML ~~LOC~~
6.0000 mg | PREFILLED_SYRINGE | Freq: Once | SUBCUTANEOUS | Status: AC
  Administered 2016-11-22: 6 mg via SUBCUTANEOUS
  Filled 2016-11-22: qty 0.6

## 2016-11-22 NOTE — Patient Instructions (Signed)
Pegfilgrastim injection What is this medicine? PEGFILGRASTIM (PEG fil gra stim) is a long-acting granulocyte colony-stimulating factor that stimulates the growth of neutrophils, a type of white blood cell important in the body's fight against infection. It is used to reduce the incidence of fever and infection in patients with certain types of cancer who are receiving chemotherapy that affects the bone marrow, and to increase survival after being exposed to high doses of radiation. This medicine may be used for other purposes; ask your health care provider or pharmacist if you have questions. What should I tell my health care provider before I take this medicine? They need to know if you have any of these conditions: -kidney disease -latex allergy -ongoing radiation therapy -sickle cell disease -skin reactions to acrylic adhesives (On-Body Injector only) -an unusual or allergic reaction to pegfilgrastim, filgrastim, other medicines, foods, dyes, or preservatives -pregnant or trying to get pregnant -breast-feeding How should I use this medicine? This medicine is for injection under the skin. If you get this medicine at home, you will be taught how to prepare and give the pre-filled syringe or how to use the On-body Injector. Refer to the patient Instructions for Use for detailed instructions. Use exactly as directed. Take your medicine at regular intervals. Do not take your medicine more often than directed. It is important that you put your used needles and syringes in a special sharps container. Do not put them in a trash can. If you do not have a sharps container, call your pharmacist or healthcare provider to get one. Talk to your pediatrician regarding the use of this medicine in children. While this drug may be prescribed for selected conditions, precautions do apply. Overdosage: If you think you have taken too much of this medicine contact a poison control center or emergency room at  once. NOTE: This medicine is only for you. Do not share this medicine with others. What if I miss a dose? It is important not to miss your dose. Call your doctor or health care professional if you miss your dose. If you miss a dose due to an On-body Injector failure or leakage, a new dose should be administered as soon as possible using a single prefilled syringe for manual use. What may interact with this medicine? Interactions have not been studied. Give your health care provider a list of all the medicines, herbs, non-prescription drugs, or dietary supplements you use. Also tell them if you smoke, drink alcohol, or use illegal drugs. Some items may interact with your medicine. This list may not describe all possible interactions. Give your health care provider a list of all the medicines, herbs, non-prescription drugs, or dietary supplements you use. Also tell them if you smoke, drink alcohol, or use illegal drugs. Some items may interact with your medicine. What should I watch for while using this medicine? You may need blood work done while you are taking this medicine. If you are going to need a MRI, CT scan, or other procedure, tell your doctor that you are using this medicine (On-Body Injector only). What side effects may I notice from receiving this medicine? Side effects that you should report to your doctor or health care professional as soon as possible: -allergic reactions like skin rash, itching or hives, swelling of the face, lips, or tongue -dizziness -fever -pain, redness, or irritation at site where injected -pinpoint red spots on the skin -red or dark-brown urine -shortness of breath or breathing problems -stomach or side pain, or pain   at the shoulder -swelling -tiredness -trouble passing urine or change in the amount of urine Side effects that usually do not require medical attention (report to your doctor or health care professional if they continue or are  bothersome): -bone pain -muscle pain This list may not describe all possible side effects. Call your doctor for medical advice about side effects. You may report side effects to FDA at 1-800-FDA-1088. Where should I keep my medicine? Keep out of the reach of children. Store pre-filled syringes in a refrigerator between 2 and 8 degrees C (36 and 46 degrees F). Do not freeze. Keep in carton to protect from light. Throw away this medicine if it is left out of the refrigerator for more than 48 hours. Throw away any unused medicine after the expiration date. NOTE: This sheet is a summary. It may not cover all possible information. If you have questions about this medicine, talk to your doctor, pharmacist, or health care provider.    2016, Elsevier/Gold Standard. (2014-02-25 14:30:14)  

## 2016-11-27 ENCOUNTER — Encounter: Payer: Self-pay | Admitting: Oncology

## 2016-11-29 ENCOUNTER — Other Ambulatory Visit: Payer: Self-pay

## 2016-11-29 ENCOUNTER — Encounter: Payer: Self-pay | Admitting: Family Medicine

## 2016-11-29 MED ORDER — CARVEDILOL 3.125 MG PO TABS: ORAL_TABLET | ORAL | 9 refills | Status: DC

## 2016-11-30 ENCOUNTER — Telehealth: Payer: Self-pay | Admitting: *Deleted

## 2016-11-30 NOTE — Telephone Encounter (Signed)
Pt complaining of  Soreness at [facility] insertion site.  Denied redness swelling or warm to touch.  Referred her to surgeon who placed PAC.

## 2016-12-05 ENCOUNTER — Ambulatory Visit: Payer: PPO

## 2016-12-07 DIAGNOSIS — F3131 Bipolar disorder, current episode depressed, mild: Secondary | ICD-10-CM | POA: Diagnosis not present

## 2016-12-10 ENCOUNTER — Telehealth: Payer: Self-pay | Admitting: Oncology

## 2016-12-10 ENCOUNTER — Other Ambulatory Visit (HOSPITAL_BASED_OUTPATIENT_CLINIC_OR_DEPARTMENT_OTHER): Payer: PPO

## 2016-12-10 ENCOUNTER — Ambulatory Visit (HOSPITAL_BASED_OUTPATIENT_CLINIC_OR_DEPARTMENT_OTHER): Payer: PPO | Admitting: Oncology

## 2016-12-10 ENCOUNTER — Ambulatory Visit (HOSPITAL_BASED_OUTPATIENT_CLINIC_OR_DEPARTMENT_OTHER): Payer: PPO

## 2016-12-10 ENCOUNTER — Ambulatory Visit: Payer: PPO

## 2016-12-10 ENCOUNTER — Encounter: Payer: Self-pay | Admitting: Oncology

## 2016-12-10 VITALS — BP 130/42 | HR 57 | Temp 98.0°F | Resp 20 | Ht 61.0 in | Wt 155.8 lb

## 2016-12-10 DIAGNOSIS — C50919 Malignant neoplasm of unspecified site of unspecified female breast: Secondary | ICD-10-CM

## 2016-12-10 DIAGNOSIS — Z23 Encounter for immunization: Secondary | ICD-10-CM

## 2016-12-10 DIAGNOSIS — Z5111 Encounter for antineoplastic chemotherapy: Secondary | ICD-10-CM

## 2016-12-10 DIAGNOSIS — C50412 Malignant neoplasm of upper-outer quadrant of left female breast: Secondary | ICD-10-CM | POA: Diagnosis not present

## 2016-12-10 DIAGNOSIS — C482 Malignant neoplasm of peritoneum, unspecified: Secondary | ICD-10-CM

## 2016-12-10 DIAGNOSIS — Z95828 Presence of other vascular implants and grafts: Secondary | ICD-10-CM

## 2016-12-10 DIAGNOSIS — Z170 Estrogen receptor positive status [ER+]: Secondary | ICD-10-CM | POA: Diagnosis not present

## 2016-12-10 LAB — COMPREHENSIVE METABOLIC PANEL
ALBUMIN: 3.2 g/dL — AB (ref 3.5–5.0)
ALK PHOS: 75 U/L (ref 40–150)
ALT: <6 U/L (ref 0–55)
AST: 11 U/L (ref 5–34)
Anion Gap: 10 mEq/L (ref 3–11)
BUN: 10.8 mg/dL (ref 7.0–26.0)
CO2: 26 mEq/L (ref 22–29)
Calcium: 8.9 mg/dL (ref 8.4–10.4)
Chloride: 106 mEq/L (ref 98–109)
Creatinine: 0.7 mg/dL (ref 0.6–1.1)
GLUCOSE: 108 mg/dL (ref 70–140)
POTASSIUM: 3.8 meq/L (ref 3.5–5.1)
SODIUM: 142 meq/L (ref 136–145)
Total Bilirubin: 0.24 mg/dL (ref 0.20–1.20)
Total Protein: 7 g/dL (ref 6.4–8.3)

## 2016-12-10 LAB — CBC WITH DIFFERENTIAL/PLATELET
BASO%: 0.4 % (ref 0.0–2.0)
BASOS ABS: 0 10*3/uL (ref 0.0–0.1)
EOS ABS: 0 10*3/uL (ref 0.0–0.5)
EOS%: 1 % (ref 0.0–7.0)
HCT: 29.3 % — ABNORMAL LOW (ref 34.8–46.6)
HEMOGLOBIN: 9.2 g/dL — AB (ref 11.6–15.9)
LYMPH%: 41 % (ref 14.0–49.7)
MCH: 32.3 pg (ref 25.1–34.0)
MCHC: 31.3 g/dL — ABNORMAL LOW (ref 31.5–36.0)
MCV: 103.2 fL — AB (ref 79.5–101.0)
MONO#: 0.3 10*3/uL (ref 0.1–0.9)
MONO%: 9.7 % (ref 0.0–14.0)
NEUT#: 1.3 10*3/uL — ABNORMAL LOW (ref 1.5–6.5)
NEUT%: 47.9 % (ref 38.4–76.8)
Platelets: 62 10*3/uL — ABNORMAL LOW (ref 145–400)
RBC: 2.84 10*6/uL — ABNORMAL LOW (ref 3.70–5.45)
RDW: 16.4 % — AB (ref 11.2–14.5)
WBC: 2.8 10*3/uL — ABNORMAL LOW (ref 3.9–10.3)
lymph#: 1.1 10*3/uL (ref 0.9–3.3)

## 2016-12-10 MED ORDER — SODIUM CHLORIDE 0.9 % IV SOLN
Freq: Once | INTRAVENOUS | Status: AC
  Administered 2016-12-10: 11:00:00 via INTRAVENOUS

## 2016-12-10 MED ORDER — SODIUM CHLORIDE 0.9 % IV SOLN
570.0000 mg | Freq: Once | INTRAVENOUS | Status: AC
  Administered 2016-12-10: 570 mg via INTRAVENOUS
  Filled 2016-12-10: qty 57

## 2016-12-10 MED ORDER — PALONOSETRON HCL INJECTION 0.25 MG/5ML
0.2500 mg | Freq: Once | INTRAVENOUS | Status: AC
  Administered 2016-12-10: 0.25 mg via INTRAVENOUS

## 2016-12-10 MED ORDER — HEPARIN SOD (PORK) LOCK FLUSH 100 UNIT/ML IV SOLN
500.0000 [IU] | Freq: Once | INTRAVENOUS | Status: AC | PRN
  Administered 2016-12-10: 500 [IU]
  Filled 2016-12-10: qty 5

## 2016-12-10 MED ORDER — INFLUENZA VAC SPLIT QUAD 0.5 ML IM SUSY
0.5000 mL | PREFILLED_SYRINGE | Freq: Once | INTRAMUSCULAR | Status: AC
  Administered 2016-12-10: 0.5 mL via INTRAMUSCULAR
  Filled 2016-12-10: qty 0.5

## 2016-12-10 MED ORDER — DEXAMETHASONE SODIUM PHOSPHATE 10 MG/ML IJ SOLN
4.0000 mg | Freq: Once | INTRAMUSCULAR | Status: AC
  Administered 2016-12-10: 4 mg via INTRAVENOUS

## 2016-12-10 MED ORDER — CARBOPLATIN CHEMO INTRADERMAL TEST DOSE 100MCG/0.02ML
100.0000 ug | Freq: Once | INTRADERMAL | Status: AC
  Administered 2016-12-10: 100 ug via INTRADERMAL
  Filled 2016-12-10: qty 0.02

## 2016-12-10 MED ORDER — DEXAMETHASONE SODIUM PHOSPHATE 10 MG/ML IJ SOLN
INTRAMUSCULAR | Status: AC
  Filled 2016-12-10: qty 1

## 2016-12-10 MED ORDER — SODIUM CHLORIDE 0.9% FLUSH
10.0000 mL | INTRAVENOUS | Status: DC | PRN
  Administered 2016-12-10: 10 mL
  Filled 2016-12-10: qty 10

## 2016-12-10 MED ORDER — SODIUM CHLORIDE 0.9 % IJ SOLN
10.0000 mL | INTRAMUSCULAR | Status: DC | PRN
  Administered 2016-12-10: 10 mL via INTRAVENOUS
  Filled 2016-12-10: qty 10

## 2016-12-10 MED ORDER — PALONOSETRON HCL INJECTION 0.25 MG/5ML
INTRAVENOUS | Status: AC
  Filled 2016-12-10: qty 5

## 2016-12-10 NOTE — Telephone Encounter (Signed)
No los per 10/22. °

## 2016-12-10 NOTE — Patient Instructions (Signed)
[facility] Discharge Instructions for Patients Receiving Chemotherapy  Today you received the following chemotherapy agents: Carboplatin.  To help prevent nausea and vomiting after your treatment, we encourage you to take your nausea medication as prescribed.   If you develop nausea and vomiting that is not controlled by your nausea medication, call the clinic.   BELOW ARE SYMPTOMS THAT SHOULD BE REPORTED IMMEDIATELY:  *FEVER GREATER THAN 100.5 F  *CHILLS WITH OR WITHOUT FEVER  NAUSEA AND VOMITING THAT IS NOT CONTROLLED WITH YOUR NAUSEA MEDICATION  *UNUSUAL SHORTNESS OF BREATH  *UNUSUAL BRUISING OR BLEEDING  TENDERNESS IN MOUTH AND THROAT WITH OR WITHOUT PRESENCE OF ULCERS  *URINARY PROBLEMS  *BOWEL PROBLEMS  UNUSUAL RASH Items with * indicate a potential emergency and should be followed up as soon as possible.  Feel free to call the clinic should you have any questions or concerns. The clinic phone number is [phone].  Please show the CHEMO ALERT CARD at check-in to the Emergency Department and triage nurse.     

## 2016-12-10 NOTE — Progress Notes (Signed)
[facility] Follow up:    [name], Julie G, MD [address] [place] [address]   DIAGNOSIS: 61 year old woman diagnosed with:  1. 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 This was diagnosed in July of 2014. 2. 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 The tumor is ER positive PR negative, HER-2 negative.  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 She is also status post lumpectomy for breast cancer diagnosed 25 years ago followed by radiation and chemotherapy under the care of Dr. Sonny Dandy. She did not receive any hormonal therapy. Chemotherapy utilizing carboplatin and Taxotere started on 10/01/2012. Avastin was added with cycle 4. This was discontinued in June of 2015. She is S/P Avastin maintenance only between June 2015 till March 2017. Therapy discontinued due to progression of disease. She is status post Doxil salvage chemotherapy therapy discontinued because of hypersensitivity reaction in March 2017.  CURRENT THERAPY: Carboplatin and AUC of 5 every 3 weeks cycle 1 started on 07/08/2015. She is here for her next cycle of therapy.  INTERVAL HISTORY: [name] 61 y.o. female returns for a routine visit accompanied by her husband. The patient has ongoing fatigue but no major changes compared to her last visit. She is going to start receiving treatment on Mondays so that she has time to recover in order to attend church services on Sundays.  She denies  fevers and chills. Denies chest pain, shortness of breath, cough, hemoptysis. Denies nausea, vomiting, constipation, diarrhea. She denies GYN bleeding. The patient is here for evaluation prior to her next cycle of chemotherapy.   Patient Active Problem List   Diagnosis Date Noted  . Need for prophylactic vaccination and inoculation against influenza 12/10/2016  . Encounter for antineoplastic chemotherapy 08/29/2016  . RLS (restless legs syndrome) 04/23/2016  . Nausea and vomiting in adult 02/09/2016  . Cellulitis of right breast 01/04/2016  . Sepsis ([place]) 01/04/2016  . GERD (gastroesophageal reflux disease) 12/09/2015  . Presence of drug coated stent in LAD coronary artery 11/08/2015  . 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 Thrombocytopenia ([place]) 11/04/2015  . Coronary artery disease involving native heart without angina pectoris 11/03/2015  . NSTEMI (non-ST elevated myocardial infarction) ([place])   . Pain in the chest 10/25/2015  . Pancytopenia ([place]) 10/25/2015  . Elevated troponin 10/25/2015  . Chest wall pain 07/15/2015  . Genetic testing 07/04/2015  . Port catheter in place 06/16/2015  . Dizziness 03/29/2015  . Primary peritoneal carcinomatosis ([place]) 08/13/2013  . Malignant neoplasm of female breast ([place]) 09/19/2012  . Lithium use 09/17/2011  . Abnormal coordination 09/17/2011  . Premature menopause   . Depression   . Anxiety   . Bipolar disorder ([place])   . ADD (attention deficit disorder)   . NONSPECIFIC ABNORMAL ELECTROCARDIOGRAM 11/04/2009  . ALLERGIC RHINITIS 09/01/2009  . NONSPEC ELEVATION OF LEVELS OF TRANSAMINASE/LDH 07/28/2008  . MOTOR RESTLESSNESS 02/19/2008  . Internal hemorrhoids without mention of complication 28/36/6294  . ESOPHAGEAL STRICTURE 09/15/2007  . Obstructive sleep apnea 07/21/2007  . Hyperlipidemia 07/07/2007  . Osteopenia 02/20/2007  .  Hypothyroidism 10/01/2006  . BREAST CANCER, HX OF 08/12/2006    is allergic to doxil [doxorubicin hcl  liposomal] and pollen extract.  MEDICAL HISTORY: Past Medical History:  Diagnosis Date  . ADD (attention deficit disorder)   . Allergy    seasonal  . Anxiety   . Bipolar disorder ([place])   . Breast cancer ([place]) 1989   right breast  . Cancer ([place])    Omentum  . Coronary artery disease   . Depression   . Hematochezia   . History of echocardiogram    a. Limited Echo 9/17: EF 60-65%, no RWMA, Gr 1 DD, trivial AI, trivial MR, GLS -12% (likely underestimated), no pericardial effusion  . Hyperlipidemia   . Hypothyroidism   . Maintenance chemotherapy    Pt has chemo every 3 weeks (on Friday)  . NSTEMI (non-ST elevated myocardial infarction) ([place]) 10/26/2015  . Osteopenia 03/2009   t score -2.1 FRAX 4.6/0.4  . Premature menopause   . Sleep apnea    mild    SURGICAL HISTORY: Past Surgical History:  Procedure Laterality Date  . BREAST SURGERY  1989   RIGHT LUMPECTOMY, RADIATION AND CHEMO  . CARDIAC CATHETERIZATION N/A 10/26/2015   Procedure: Left Heart Cath and Coronary Angiography;  Surgeon: Wellington Hampshire, MD;  Location: [facility];  Service: Cardiovascular;  Laterality: N/A;  . CARDIAC CATHETERIZATION N/A 10/26/2015   Procedure: Coronary Stent Intervention;  Surgeon: Wellington Hampshire, MD;  Location: [facility];  Service: Cardiovascular;  Laterality: N/A;  . CARDIAC CATHETERIZATION N/A 11/08/2015   Procedure: Left Heart Cath and Coronary Angiography;  Surgeon: Jolaine Artist, MD;  Location: [facility];  Service: Cardiovascular;  Laterality: N/A;  . drug eluting stent  10/26/2015  . FOOT SURGERY  2013   BILATERAL   . HYSTEROSCOPY  2011   [place]  . PELVIC LAPAROSCOPY/ Hysteroscopy  1996    SOCIAL HISTORY: Social History   Social History  . Marital status: Married    Spouse name: [name]  . Number of children: 0  . Years of education: 14   Occupational History  . unemployed [employer]   Social History Main Topics  . Smoking  status: Never Smoker  . Smokeless tobacco: Never Used  . Alcohol use 0.0 oz/week     Comment: very rarely - maybe 1 glass wine every 3 mos  . Drug use: No  . Sexual activity: Not Currently    Birth control/ protection: Post-menopausal     Comment: 1st intercourse 52 yo-5 partners   Other Topics Concern  . Not on file   Social History Narrative   Lives at home with husband.    Caffeine use:  Tea/soda occass    FAMILY HISTORY: Family History  Problem Relation Age of Onset  . Breast cancer Maternal Aunt 29  . Diabetes Maternal Grandmother   . Heart disease Maternal Grandmother   . Heart disease Maternal Grandfather   . Hypertension Paternal Grandfather   . Heart Problems Paternal Grandfather   . Breast cancer Maternal Aunt        dx. early 61s; had negative GT approx 10 years ago  . Breast cancer Maternal Aunt        dx. 89s with recurrence  . Allergies Father   . Heart disease Mother   . Other Mother        hx of hysterectomy   . Liver cancer Cousin 17       +  EtOH  . Cancer Cousin        paternal 1st cousin, once-removed dx. NOS cancer (maybe ovarian) in late 30s-40s  . Cancer Cousin        female paternal 2nd cousin d. of NOS cancer in her 20s-early 14s  . Ovarian cancer Neg Hx   . Colon cancer Neg Hx     Review of Systems  Constitutional: Positive for fatigue. Negative for appetite change, chills, fever and unexpected weight change.  HENT:  Negative.   Eyes: Negative.   Respiratory: Negative.   Cardiovascular: Negative.   Gastrointestinal: Negative.   Genitourinary: Negative.    Musculoskeletal: Negative.   Skin: Negative.   Neurological: Negative.   Hematological: Negative.   Psychiatric/Behavioral: Negative.       PHYSICAL EXAMINATION  ECOG PERFORMANCE STATUS: 1 - Symptomatic but completely ambulatory  Vitals:   12/10/16 1032  BP: (!) 130/42  Pulse: (!) 57  Resp: 20  Temp: 98 F (36.7 C)  SpO2: 100%    Physical Exam  Constitutional: She is  oriented to person, place, and time and well-developed, well-nourished, and in no distress. No distress.  HENT:  Head: Normocephalic and atraumatic.  Mouth/Throat: Oropharynx is clear and moist. No oropharyngeal exudate.  Eyes: Conjunctivae are normal. Right eye exhibits no discharge. Left eye exhibits no discharge. No scleral icterus.  Neck: Normal range of motion. Neck supple.  Cardiovascular: Normal rate, regular rhythm, normal heart sounds and intact distal pulses.   Pulmonary/Chest: Effort normal and breath sounds normal. No respiratory distress. She has no wheezes. She has no rales.  Abdominal: Soft. Bowel sounds are normal. She exhibits no distension and no mass. There is no tenderness.  Musculoskeletal: Normal range of motion. She exhibits no edema.  Lymphadenopathy:    She has no cervical adenopathy.  Neurological: She is alert and oriented to person, place, and time. She exhibits normal muscle tone. Gait normal. Coordination normal.  Skin: Skin is warm and dry. No rash noted. She is not diaphoretic. No erythema. No pallor.  Psychiatric: Mood, memory, affect and judgment normal.  Vitals reviewed.   LABORATORY DATA:  CBC    Component Value Date/Time   WBC 2.8 (L) 12/10/2016 0939   WBC 6.8 04/16/2016 0806   RBC 2.84 (L) 12/10/2016 0939   RBC 3.03 (L) 04/16/2016 0806   HGB 9.2 (L) 12/10/2016 0939   HCT 29.3 (L) 12/10/2016 0939   PLT 62 (L) 12/10/2016 0939   PLT 94 (LL) 05/25/2015 0931   MCV 103.2 (H) 12/10/2016 0939   MCH 32.3 12/10/2016 0939   MCH 31.6 01/09/2016 0441   MCHC 31.3 (L) 12/10/2016 0939   MCHC 32.6 04/16/2016 0806   RDW 16.4 (H) 12/10/2016 0939   LYMPHSABS 1.1 12/10/2016 0939   MONOABS 0.3 12/10/2016 0939   EOSABS 0.0 12/10/2016 0939   EOSABS 0.1 05/25/2015 0931   BASOSABS 0.0 12/10/2016 0939    CMP     Component Value Date/Time   NA 142 12/10/2016 0939   K 3.8 12/10/2016 0939   CL 104 04/16/2016 0806   CO2 26 12/10/2016 0939   GLUCOSE 108  12/10/2016 0939   BUN 10.8 12/10/2016 0939   CREATININE 0.7 12/10/2016 0939   CALCIUM 8.9 12/10/2016 0939   PROT 7.0 12/10/2016 0939   ALBUMIN 3.2 (L) 12/10/2016 0939   AST 11 12/10/2016 0939   ALT <6 12/10/2016 0939   ALKPHOS 75 12/10/2016 0939   BILITOT 0.24 12/10/2016 0939   GFRNONAA >60 01/09/2016 0441  GFRAA >60 01/09/2016 0441   RADIOGRAPHIC STUDIES:  No results found.  ASSESSMENT and THERAPY PLAN:   61 year old woman with the following issues:  1. Peritoneal carcinomatosis with omental involvement. The pathology confirmed the presence of adenocarcinoma of likely GYN etiology. Her CA 125 was elevated at 333.5 on 09/19/2012. She is status post systemic chemotherapy with an excellent response utilizing carboplatin and Taxotere and a Avastin . This therapy was held in June of 2015 and currently on Avastin maintenance.   CT scan obtained on 05/05/2015 as well as her tumor markers showed clear progression. Her CA-125 is up to 189 and her CT scan showed increase in her peritoneal disease.   She is currently receiving salvage therapy with carboplatin single agent at AUC of 5 With excellent response.  CT scan obtained on 06/25/2016 Continues to show stable disease.   Her CA-125 slightly declined but overall stable.  She is agreeable to continue with the same dose and schedule. She will proceed with her next cycle of chemotherapy today as scheduled.  Her next cycle of chemotherapy is scheduled for November 12. We will obtain a CT scan of the chest abdomen and pelvis for staging purposes before her next appointment.  2. IV access: Port-A-Cath is without complications. No recent infection noted.  3. Thrombocytopenia: Her platelets are stable and consistent with previous lab results. No bleeding reported since the last visit.  4. Abdominal distention: No ascites noted on ultrasound unlikely related to peritoneal carcinomatosis. No GI symptoms noted.  5. Left breast mass:  She status post biopsy with the pathology indicating T2 N1 disease with the tumor have ER positive, PR negative HER-2 negative. Her Ki-67 is 50%.  Mammogram obtained on 03/20/2016 showed excellent response to systemic chemotherapy. We'll continue to follow with periodic mammography.  8. Neutropenia: Neulasta will be given after each cycle of therapy.   9. Nausea: Well-controlled with Zofran.  10. Flu vaccine was administered in infusion today per the patient request.  11. Followup: She will follow-up in 3 weeks for evaluation prior to her next cycle of chemotherapy and to review her restaging CT scan results.    No orders of the defined types were placed in this encounter.   All questions were answered. The patient knows to call the clinic with any problems, questions or concerns. We can certainly see the patient much sooner if necessary.  Mikey Bussing, NP 12/10/2016

## 2016-12-10 NOTE — Progress Notes (Signed)
OK to treat with today's CBC results per Mikey Bussing, NP

## 2016-12-11 ENCOUNTER — Ambulatory Visit (HOSPITAL_BASED_OUTPATIENT_CLINIC_OR_DEPARTMENT_OTHER): Payer: PPO

## 2016-12-11 VITALS — BP 127/57 | HR 80 | Temp 97.9°F | Resp 18

## 2016-12-11 DIAGNOSIS — Z5189 Encounter for other specified aftercare: Secondary | ICD-10-CM | POA: Diagnosis not present

## 2016-12-11 DIAGNOSIS — C482 Malignant neoplasm of peritoneum, unspecified: Secondary | ICD-10-CM | POA: Diagnosis not present

## 2016-12-11 LAB — CA 125: CANCER ANTIGEN (CA) 125: 24.4 U/mL (ref 0.0–38.1)

## 2016-12-11 MED ORDER — PEGFILGRASTIM INJECTION 6 MG/0.6ML ~~LOC~~
6.0000 mg | PREFILLED_SYRINGE | Freq: Once | SUBCUTANEOUS | Status: AC
  Administered 2016-12-11: 6 mg via SUBCUTANEOUS
  Filled 2016-12-11: qty 0.6

## 2016-12-11 NOTE — Patient Instructions (Signed)
Pegfilgrastim injection What is this medicine? PEGFILGRASTIM (PEG fil gra stim) is a long-acting granulocyte colony-stimulating factor that stimulates the growth of neutrophils, a type of white blood cell important in the body's fight against infection. It is used to reduce the incidence of fever and infection in patients with certain types of cancer who are receiving chemotherapy that affects the bone marrow, and to increase survival after being exposed to high doses of radiation. This medicine may be used for other purposes; ask your health care provider or pharmacist if you have questions. What should I tell my health care provider before I take this medicine? They need to know if you have any of these conditions: -kidney disease -latex allergy -ongoing radiation therapy -sickle cell disease -skin reactions to acrylic adhesives (On-Body Injector only) -an unusual or allergic reaction to pegfilgrastim, filgrastim, other medicines, foods, dyes, or preservatives -pregnant or trying to get pregnant -breast-feeding How should I use this medicine? This medicine is for injection under the skin. If you get this medicine at home, you will be taught how to prepare and give the pre-filled syringe or how to use the On-body Injector. Refer to the patient Instructions for Use for detailed instructions. Use exactly as directed. Take your medicine at regular intervals. Do not take your medicine more often than directed. It is important that you put your used needles and syringes in a special sharps container. Do not put them in a trash can. If you do not have a sharps container, call your pharmacist or healthcare provider to get one. Talk to your pediatrician regarding the use of this medicine in children. While this drug may be prescribed for selected conditions, precautions do apply. Overdosage: If you think you have taken too much of this medicine contact a poison control center or emergency room at  once. NOTE: This medicine is only for you. Do not share this medicine with others. What if I miss a dose? It is important not to miss your dose. Call your doctor or health care professional if you miss your dose. If you miss a dose due to an On-body Injector failure or leakage, a new dose should be administered as soon as possible using a single prefilled syringe for manual use. What may interact with this medicine? Interactions have not been studied. Give your health care provider a list of all the medicines, herbs, non-prescription drugs, or dietary supplements you use. Also tell them if you smoke, drink alcohol, or use illegal drugs. Some items may interact with your medicine. This list may not describe all possible interactions. Give your health care provider a list of all the medicines, herbs, non-prescription drugs, or dietary supplements you use. Also tell them if you smoke, drink alcohol, or use illegal drugs. Some items may interact with your medicine. What should I watch for while using this medicine? You may need blood work done while you are taking this medicine. If you are going to need a MRI, CT scan, or other procedure, tell your doctor that you are using this medicine (On-Body Injector only). What side effects may I notice from receiving this medicine? Side effects that you should report to your doctor or health care professional as soon as possible: -allergic reactions like skin rash, itching or hives, swelling of the face, lips, or tongue -dizziness -fever -pain, redness, or irritation at site where injected -pinpoint red spots on the skin -red or dark-brown urine -shortness of breath or breathing problems -stomach or side pain, or pain   at the shoulder -swelling -tiredness -trouble passing urine or change in the amount of urine Side effects that usually do not require medical attention (report to your doctor or health care professional if they continue or are  bothersome): -bone pain -muscle pain This list may not describe all possible side effects. Call your doctor for medical advice about side effects. You may report side effects to FDA at 1-800-FDA-1088. Where should I keep my medicine? Keep out of the reach of children. Store pre-filled syringes in a refrigerator between 2 and 8 degrees C (36 and 46 degrees F). Do not freeze. Keep in carton to protect from light. Throw away this medicine if it is left out of the refrigerator for more than 48 hours. Throw away any unused medicine after the expiration date. NOTE: This sheet is a summary. It may not cover all possible information. If you have questions about this medicine, talk to your doctor, pharmacist, or health care provider.    2016, Elsevier/Gold Standard. (2014-02-25 14:30:14)  

## 2016-12-12 ENCOUNTER — Ambulatory Visit: Payer: PPO

## 2016-12-12 ENCOUNTER — Other Ambulatory Visit: Payer: PPO

## 2016-12-12 ENCOUNTER — Ambulatory Visit: Payer: PPO | Admitting: Oncology

## 2016-12-12 ENCOUNTER — Encounter: Payer: Self-pay | Admitting: Oncology

## 2016-12-12 ENCOUNTER — Ambulatory Visit: Payer: PPO | Admitting: Physician Assistant

## 2016-12-12 NOTE — Progress Notes (Signed)
Subjective:   [name] is a 61 y.o. female who presents for an Initial Medicare Annual Wellness Visit.  The Patient was informed that the wellness visit is to identify future health risk and educate and initiate measures that can reduce risk for increased disease through the lifespan.    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  She is going to check with psych on Depakote dosage;    There are no preventive care reminders to display for this patient.  Colonoscopy 11/2014 - due 2026 Mammogram 02/2016  Dexa 03/2009- dr. Phineas Real following (GYN) apt with him on 11/7 Had flu vaccine with chemo 12/10/2016  VS reviewed;   Diet does eat small meals at a time Reviewed some suggestions from [email or web address] to try to eat when she is going through her nausea post chemo., Also discussed complimentary oils as ginger and orange sometimes help; not for ingestion and not in lieu of her nausea meds;   BMI 28  Exercise Is going to the [facility] for cancer patients and getting in the pool one day a week. Also attends classes there for creative writing; art and other  Also sings in her church choir    Stressors: Sees a Social worker every week  This is very beneficial to keeping her on point with her program    Sleep patterns: has unusual sleep pattern but states she feels good with this       Cardiac Risk Factors Addressed Hyperlipidemia - 03/2016; chol/hdl ratio 3 Diabetes neg Has had  an MI but no issues since   Advanced Directives has been completed   Patient Care Team: [name], [name], MD as PCP - General (Family Medicine) Rosamaria Lints, MD (Inactive) as Referring Physician (Neurology) Wyatt Portela, MD as Consulting Physician (Oncology)  Cardiac Risk Factors include: advanced age (>69men, >34 women);hypertension     Objective:    Today's Vitals   12/13/16 1458  BP: 118/90  Pulse: 69  SpO2: 97%  Weight: 155 lb (70.3 kg)  Height: 5\' 2"  (1.575 m)   Body mass index is 28.35 kg/m.   Current Medications (verified) Outpatient Encounter Prescriptions as of 12/13/2016  Medication Sig  . acetaminophen (TYLENOL) 325 MG tablet Take 2 tablets (650 mg total) by mouth every 4 (four) hours as needed for mild pain, fever or headache.  . Acetylcarnitine HCl (ACETYL L-CARNITINE PO) Take 2,000 mg by mouth at bedtime.  . ALPRAZolam (XANAX) 0.5 MG tablet Take 0.5 mg by mouth 2 (two) times daily as needed for anxiety.  Marland Kitchen aspirin 81 MG chewable tablet Chew 1 tablet (81 mg total) by mouth daily.  Marland Kitchen atorvastatin (LIPITOR) 40 MG tablet Take 1 tablet (40 mg total) by mouth daily at 6 PM.  . carvedilol (COREG) 3.125 MG tablet Take 1/2 tablet by mouth twice daily with meals.  . clotrimazole-betamethasone (LOTRISONE) cream Apply 1 application topically 2 (two) times daily.  . divalproex (DEPAKOTE) 250 MG DR tablet  Take by mouth. Take 500mg s in the morning and 750mg s at night  . fluticasone (FLONASE) 50 MCG/ACT nasal spray INHALE 2 SPRAYS INTO EACH NOSTRIL EVERY NIGHT  . gabapentin (NEURONTIN) 100 MG capsule Take 1 capsule by mouth twice daily.  Marland Kitchen lamoTRIgine (LAMICTAL) 100 MG tablet Take 100 mg by mouth 2 (two) times daily.  Marland Kitchen levothyroxine (SYNTHROID, LEVOTHROID) 100 MCG tablet Take 1 tablet (100 mcg total) by mouth daily before breakfast.  . lidocaine-prilocaine (EMLA) cream Apply topically as needed. Apply to port with every chemotherapy.  . loperamide (IMODIUM A-D) 2 MG  tablet Take 2 mg by mouth as needed for diarrhea or loose stools.  Marland Kitchen loratadine (CLARITIN) 10 MG tablet Take 10 mg by mouth daily as needed for allergies (with chemo).  . nitroGLYCERIN (NITROSTAT) 0.4 MG SL tablet Place 1 tablet (0.4 mg total) under the tongue every 5 (five) minutes as needed for chest pain.  Marland Kitchen ondansetron (ZOFRAN ODT) 8 MG disintegrating tablet Take 1 tablet (8 mg total) by mouth every 8 (eight) hours as needed for nausea or vomiting.  . ondansetron (ZOFRAN) 8 MG tablet TAKE 1 TABLET BY MOUTH EVERY 8 HOURS AS NEEDED FOR NAUSEA AND VOMITING  . pantoprazole (PROTONIX) 40 MG tablet Take 1 tablet (40 mg total) by mouth daily.  . prochlorperazine (COMPAZINE) 10 MG tablet Take 1 tablet (10 mg total) by mouth every 6 (six) hours as needed for nausea or vomiting.  Marland Kitchen rOPINIRole (REQUIP) 0.5 MG tablet Take 3 tablets (1.5 mg total) by mouth at bedtime.  . sertraline (ZOLOFT) 50 MG tablet    Facility-Administered Encounter Medications as of 12/13/2016  Medication  . heparin lock flush 100 unit/mL  . sodium chloride flush (NS) 0.9 % injection 10 mL    Allergies (verified) Doxil [doxorubicin hcl liposomal] and Pollen extract   History: Past Medical History:  Diagnosis Date  . ADD (attention deficit disorder)   . Allergy    seasonal  . Anxiety   . Bipolar disorder ([place])   . Breast cancer ([place]) 1989   right breast  . Cancer ([place])    Omentum  . Coronary artery disease   . Depression   . Hematochezia   . History of echocardiogram    a. Limited Echo 9/17: EF 60-65%, no RWMA, Gr 1 DD, trivial AI, trivial MR, GLS -12% (likely underestimated), no pericardial effusion  . Hyperlipidemia   . Hypothyroidism   . Maintenance chemotherapy    Pt has chemo every 3 weeks (on Friday)  . NSTEMI (non-ST elevated myocardial infarction) ([place]) 10/26/2015  . Osteopenia 03/2009   t score -2.1 FRAX 4.6/0.4  . Premature menopause   . Sleep apnea    mild   Past Surgical History:  Procedure  Laterality Date  . BREAST SURGERY  1989   RIGHT LUMPECTOMY, RADIATION AND CHEMO  . CARDIAC CATHETERIZATION N/A 10/26/2015   Procedure: Left Heart Cath and Coronary Angiography;  Surgeon: Wellington Hampshire, MD;  Location: [facility];  Service: Cardiovascular;  Laterality: N/A;  . CARDIAC CATHETERIZATION N/A 10/26/2015   Procedure: Coronary Stent Intervention;  Surgeon: Wellington Hampshire, MD;  Location: [facility];  Service: Cardiovascular;  Laterality: N/A;  . CARDIAC CATHETERIZATION N/A 11/08/2015   Procedure: Left Heart Cath and Coronary Angiography;  Surgeon: Jolaine Artist, MD;  Location: [facility];  Service: Cardiovascular;  Laterality: N/A;  . drug eluting stent  10/26/2015  . FOOT SURGERY  2013   BILATERAL   .  HYSTEROSCOPY  2011   Polyp  . PELVIC LAPAROSCOPY/ Hysteroscopy  1996   Family History  Problem Relation Age of Onset  . Breast cancer Maternal Aunt 29  . Diabetes Maternal Grandmother   . Heart disease Maternal Grandmother   . Heart disease Maternal Grandfather   . Hypertension Paternal Grandfather   . Heart Problems Paternal Grandfather   . Breast cancer Maternal Aunt        dx. early 4s; had negative GT approx 10 years ago  . Breast cancer Maternal Aunt        dx. 15s with recurrence  . Allergies Father   . Heart disease Mother   . Other Mother        hx of hysterectomy   . Liver cancer Cousin 60       +EtOH  . Cancer Cousin        paternal 1st cousin, once-removed dx. NOS cancer (maybe ovarian) in late 30s-40s  . Cancer Cousin        female paternal 2nd cousin d. of NOS cancer in her 20s-early 33s  . Ovarian cancer Neg Hx   . Colon cancer Neg Hx    Social History   Occupational History  . unemployed [employer], [place]   Social History Main Topics  . Smoking status: Never Smoker  . Smokeless tobacco: Never Used  . Alcohol use 0.0 oz/week     Comment: very rarely - maybe 1 glass wine every 3 mos  . Drug use: No  . Sexual  activity: Not Currently    Birth control/ protection: Post-menopausal     Comment: 1st intercourse 63 yo-5 partners    Tobacco Counseling Counseling given: Yes   Activities of Daily Living In your present state of health, do you have any difficulty performing the following activities: 12/13/2016  Hearing? N  Vision? N  Difficulty concentrating or making decisions? N  Comment no issues   Walking or climbing stairs? Y  Dressing or bathing? N  Doing errands, shopping? N  Preparing Food and eating ? N  In the past six months, have you accidently leaked urine? Y  Comment fup with Dr. Phineas Real  Do you have problems with loss of bowel control? N  Managing your Medications? N  Managing your Finances? N  Housekeeping or managing your Housekeeping? N  Some recent data might be hidden    Immunizations and Health Maintenance Immunization History  Administered Date(s) Administered  . Influenza,inj,Quad PF,6+ Mos 01/21/2015, 12/09/2015, 12/10/2016  . Pneumococcal Polysaccharide-23 04/23/2016  . Tdap 04/23/2016   There are no preventive care reminders to display for this patient.  Patient Care Team: [name], [name], MD as PCP - General (Family Medicine) Rosamaria Lints, MD (Inactive) as Referring Physician (Neurology) Wyatt Portela, MD as Consulting Physician (Oncology)  Indicate any recent Medical Services you may have received from other than [facility] providers in the past year (date may be approximate).     Assessment:   This is a routine wellness examination for Tyeisha.   Hearing/Vision screen Hearing Screening Comments: Can have a hearing test anywhere  Vision Screening Comments: Vision has changed Goes to Dr. Idolina Primer  No issues that he can find   Dietary issues and exercise activities discussed: Current Exercise Habits: Structured exercise class, Time (Minutes): 60, Frequency (Times/Week): 1, Weekly Exercise (Minutes/Week): 60, Intensity: Mild  Goals    . patient            Exercise Started aquatics and keep  this up Once a week;       Depression Screen PHQ 2/9 Scores 12/12/2015 12/07/2015  PHQ - 2 Score 2 1  PHQ- 9 Score 4 -    Fall Risk Fall Risk  12/13/2016 12/08/2015 12/07/2015 02/18/2014 01/28/2014  Falls in the past year? No Yes No No No  Number falls in past yr: - 1 - - -  Injury with Fall? - Yes - - -  Risk Factor Category  - High Fall Risk - - -  Risk for fall due to : - History of fall(s) - - -    Cognitive Function: MMSE - Mini Mental State Exam 12/13/2016  Not completed: (No Data)    memory issues with meds and treatment but otherwise no failures of function; followed by Psych    Screening Tests Health Maintenance  Topic Date Due  . HEMOGLOBIN A1C  09/18/2017 (Originally 07/18/2016)  . OPHTHALMOLOGY EXAM  10/18/2017 (Originally 10/06/1965)  . URINE MICROALBUMIN  04/23/2017  . MAMMOGRAM  07/06/2017  . PAP SMEAR  12/19/2018  . PNEUMOCOCCAL POLYSACCHARIDE VACCINE (2) 04/23/2021  . COLONOSCOPY  11/21/2024  . TETANUS/TDAP  04/24/2026  . INFLUENZA VACCINE  Completed  . Hepatitis C Screening  Completed  . HIV Screening  Completed      Plan:     PCP Notes   Health Maintenance Up to date Flu vaccine given with chemo tx   Abnormal Screens  BMI wnl but did discuss eating small meals or adding snacks to her diet when she feels like eating Generally does well until chemo and now will have nausea x 4 days or so;  Educated on oils for nausea which is complementary to her meds not "in place" of her meds. Generally ginger and orange has been proven to assist with nausea. (not to ingest) Stated she had considered but were to expensive. Will email her some online sites she can try which are listed in [employer] certification program as trusted sites. May drop by [facility] to see if they have a sample. Can also ask nurse at the cancer center  Referrals  None; although she states her vision is not as good  but last vision check was 2 months ago.   Patient concerns; Started having some incontinence  More at hs  Goes to Dr. Phineas Real - will see him on 11/7 and will discuss or can fup with Dr. [name]   Nurse Concerns; As noted   Next PCP apt As noted     I have personally reviewed and noted the following in the patient's chart:   . Medical and social history . Use of alcohol, tobacco or illicit drugs  . Current medications and supplements . Functional ability and status . Nutritional status . Physical activity . Advanced directives . List of other physicians . Hospitalizations, surgeries, and ER visits in previous 12 months . Vitals . Screenings to include cognitive, depression, and falls . Referrals and appointments  In addition, I have reviewed and discussed with patient certain preventive protocols, quality metrics, and best practice recommendations. A written personalized care plan for preventive services as well as general preventive health recommendations were provided to patient.     Wynetta Fines, RN   12/13/2016

## 2016-12-13 ENCOUNTER — Ambulatory Visit: Payer: PPO

## 2016-12-13 ENCOUNTER — Ambulatory Visit (INDEPENDENT_AMBULATORY_CARE_PROVIDER_SITE_OTHER): Payer: PPO

## 2016-12-13 VITALS — BP 118/90 | HR 69 | Ht 62.0 in | Wt 155.0 lb

## 2016-12-13 DIAGNOSIS — Z0000 Encounter for general adult medical examination without abnormal findings: Secondary | ICD-10-CM | POA: Diagnosis not present

## 2016-12-13 NOTE — Progress Notes (Signed)
KIM, HANNAH R., DO  

## 2016-12-13 NOTE — Patient Instructions (Addendum)
Ms. [name] , Thank you for taking time to come for your Medicare Wellness Visit. I appreciate your ongoing commitment to your health goals. Please review the following plan we discussed and let me know if I can assist you in the future.   [facility]  [place]  can see if this helps your general nausea but is not a replacement for your chemo nausea medication This is complementary   Will ask Dr. Phineas Real about bladder issues since you have an apt with him   You can have a hearing screen anywhere [facility] - can assist with hearing aid x 1  No reviews  [name]  [address]  [phone]   These are the goals we discussed: Goals    . patient          Exercise Started aquatics and keep this up Once a week;        This is a list of the screening recommended for you and due dates:  Health Maintenance  Topic Date Due  . Hemoglobin A1C  09/18/2017*  . Eye exam for diabetics  10/18/2017*  . Urine Protein Check  04/23/2017  . Mammogram  07/06/2017  . Pap Smear  12/19/2018  . Pneumococcal vaccine (2) 04/23/2021  . Colon Cancer Screening  11/21/2024  . Tetanus Vaccine  04/24/2026  . Flu Shot  Completed  .  Hepatitis C: One time screening is recommended by Center for Disease Control  (CDC) for  adults born from [birth year] through 1963.   Completed  . HIV Screening  Completed  *Topic was postponed. The date shown is not the original due date.        Fall Prevention in the Home Falls can cause injuries. They can happen to people of all ages. There are many things you can do to make your home safe and to help prevent falls. What can I do on the outside of my home?  Regularly fix the edges of walkways and driveways and fix any cracks.  Remove anything that might make you trip as you walk through a door, such as a raised step or threshold.  Trim any bushes or trees on the path to your home.  Use  bright outdoor lighting.  Clear any walking paths of anything that might make someone trip, such as rocks or tools.  Regularly check to see if handrails are loose or broken. Make sure that both sides of any steps have handrails.  Any raised decks and porches should have guardrails on the edges.  Have any leaves, snow, or ice cleared regularly.  Use sand or salt on walking paths during winter.  Clean up any spills in your garage right away. This includes oil or grease spills. What can I do in the bathroom?  Use night lights.  Install grab bars by the toilet and in the tub and shower. Do not use towel bars as grab bars.  Use non-skid mats or decals in the tub or shower.  If you need to sit down in the shower, use a plastic, non-slip stool.  Keep the floor dry. Clean up any water that spills on the floor as soon as it happens.  Remove soap buildup in the tub or shower regularly.  Attach bath mats securely with double-sided non-slip rug tape.  Do not have throw rugs and other things on the floor that can make you trip. What can I  do in the bedroom?  Use night lights.  Make sure that you have a light by your bed that is easy to reach.  Do not use any sheets or blankets that are too big for your bed. They should not hang down onto the floor.  Have a firm chair that has side arms. You can use this for support while you get dressed.  Do not have throw rugs and other things on the floor that can make you trip. What can I do in the kitchen?  Clean up any spills right away.  Avoid walking on wet floors.  Keep items that you use a lot in easy-to-reach places.  If you need to reach something above you, use a strong step stool that has a grab bar.  Keep electrical cords out of the way.  Do not use floor polish or wax that makes floors slippery. If you must use wax, use non-skid floor wax.  Do not have throw rugs and other things on the floor that can make you trip. What can  I do with my stairs?  Do not leave any items on the stairs.  Make sure that there are handrails on both sides of the stairs and use them. Fix handrails that are broken or loose. Make sure that handrails are as long as the stairways.  Check any carpeting to make sure that it is firmly attached to the stairs. Fix any carpet that is loose or worn.  Avoid having throw rugs at the top or bottom of the stairs. If you do have throw rugs, attach them to the floor with carpet tape.  Make sure that you have a light switch at the top of the stairs and the bottom of the stairs. If you do not have them, ask someone to add them for you. What else can I do to help prevent falls?  Wear shoes that: ? Do not have high heels. ? Have rubber bottoms. ? Are comfortable and fit you well. ? Are closed at the toe. Do not wear sandals.  If you use a stepladder: ? Make sure that it is fully opened. Do not climb a closed stepladder. ? Make sure that both sides of the stepladder are locked into place. ? Ask someone to hold it for you, if possible.  Clearly mark and make sure that you can see: ? Any grab bars or handrails. ? First and last steps. ? Where the edge of each step is.  Use tools that help you move around (mobility aids) if they are needed. These include: ? Canes. ? Walkers. ? Scooters. ? Crutches.  Turn on the lights when you go into a dark area. Replace any light bulbs as soon as they burn out.  Set up your furniture so you have a clear path. Avoid moving your furniture around.  If any of your floors are uneven, fix them.  If there are any pets around you, be aware of where they are.  Review your medicines with your doctor. Some medicines can make you feel dizzy. This can increase your chance of falling. Ask your doctor what other things that you can do to help prevent falls. This information is not intended to replace advice given to you by your health care provider. Make sure you  discuss any questions you have with your health care provider. Document Released: 12/02/2008 Document Revised: 07/14/2015 Document Reviewed: 03/12/2014 Elsevier Interactive Patient Education  2018 [place] Maintenance, Female Adopting a healthy  lifestyle and getting preventive care can go a long way to promote health and wellness. Talk with your health care provider about what schedule of regular examinations is right for you. This is a good chance for you to check in with your provider about disease prevention and staying healthy. In between checkups, there are plenty of things you can do on your own. Experts have done a lot of research about which lifestyle changes and preventive measures are most likely to keep you healthy. Ask your health care provider for more information. Weight and diet Eat a healthy diet  Be sure to include plenty of vegetables, fruits, low-fat dairy products, and lean protein.  Do not eat a lot of foods high in solid fats, added sugars, or salt.  Get regular exercise. This is one of the most important things you can do for your health. ? Most adults should exercise for at least 150 minutes each week. The exercise should increase your heart rate and make you sweat (moderate-intensity exercise). ? Most adults should also do strengthening exercises at least twice a week. This is in addition to the moderate-intensity exercise.  Maintain a healthy weight  Body mass index (BMI) is a measurement that can be used to identify possible weight problems. It estimates body fat based on height and weight. Your health care provider can help determine your BMI and help you achieve or maintain a healthy weight.  For females 49 years of age and older: ? A BMI below 18.5 is considered underweight. ? A BMI of 18.5 to 24.9 is normal. ? A BMI of 25 to 29.9 is considered overweight. ? A BMI of 30 and above is considered obese.  Watch levels of cholesterol and blood  lipids  You should start having your blood tested for lipids and cholesterol at 59 years of age, then have this test every 5 years.  You may need to have your cholesterol levels checked more often if: ? Your lipid or cholesterol levels are high. ? You are older than 59 years of age. ? You are at high risk for heart disease.  Cancer screening Lung Cancer  Lung cancer screening is recommended for adults 16-74 years old who are at high risk for lung cancer because of a history of smoking.  A yearly low-dose CT scan of the lungs is recommended for people who: ? Currently smoke. ? Have quit within the past 15 years. ? Have at least a 30-pack-year history of smoking. A pack year is smoking an average of one pack of cigarettes a day for 1 year.  Yearly screening should continue until it has been 15 years since you quit.  Yearly screening should stop if you develop a health problem that would prevent you from having lung cancer treatment.  Breast Cancer  Practice breast self-awareness. This means understanding how your breasts normally appear and feel.  It also means doing regular breast self-exams. Let your health care provider know about any changes, no matter how small.  If you are in your 20s or 30s, you should have a clinical breast exam (CBE) by a health care provider every 1-3 years as part of a regular health exam.  If you are 48 or older, have a CBE every year. Also consider having a breast X-ray (mammogram) every year.  If you have a family history of breast cancer, talk to your health care provider about genetic screening.  If you are at high risk for breast cancer, talk to your  health care provider about having an MRI and a mammogram every year.  Breast cancer gene (BRCA) assessment is recommended for women who have family members with BRCA-related cancers. BRCA-related cancers include: ? Breast. ? Ovarian. ? Tubal. ? Peritoneal cancers.  Results of the assessment will  determine the need for genetic counseling and BRCA1 and BRCA2 testing.  Cervical Cancer Your health care provider may recommend that you be screened regularly for cancer of the pelvic organs (ovaries, uterus, and vagina). This screening involves a pelvic examination, including checking for microscopic changes to the surface of your cervix (Pap test). You may be encouraged to have this screening done every 3 years, beginning at age 91.  For women ages 81-65, health care providers may recommend pelvic exams and Pap testing every 3 years, or they may recommend the Pap and pelvic exam, combined with testing for human papilloma virus (HPV), every 5 years. Some types of HPV increase your risk of cervical cancer. Testing for HPV may also be done on women of any age with unclear Pap test results.  Other health care providers may not recommend any screening for nonpregnant women who are considered low risk for pelvic cancer and who do not have symptoms. Ask your health care provider if a screening pelvic exam is right for you.  If you have had past treatment for cervical cancer or a condition that could lead to cancer, you need Pap tests and screening for cancer for at least 20 years after your treatment. If Pap tests have been discontinued, your risk factors (such as having a new sexual partner) need to be reassessed to determine if screening should resume. Some women have medical problems that increase the chance of getting cervical cancer. In these cases, your health care provider may recommend more frequent screening and Pap tests.  Colorectal Cancer  This type of cancer can be detected and often prevented.  Routine colorectal cancer screening usually begins at 59 years of age and continues through 59 years of age.  Your health care provider may recommend screening at an earlier age if you have risk factors for colon cancer.  Your health care provider may also recommend using home test kits to check  for hidden blood in the stool.  A small camera at the end of a tube can be used to examine your colon directly (sigmoidoscopy or colonoscopy). This is done to check for the earliest forms of colorectal cancer.  Routine screening usually begins at age 54.  Direct examination of the colon should be repeated every 5-10 years through 59 years of age. However, you may need to be screened more often if early forms of precancerous polyps or small growths are found.  Skin Cancer  Check your skin from head to toe regularly.  Tell your health care provider about any new moles or changes in moles, especially if there is a change in a mole's shape or color.  Also tell your health care provider if you have a mole that is larger than the size of a pencil eraser.  Always use sunscreen. Apply sunscreen liberally and repeatedly throughout the day.  Protect yourself by wearing long sleeves, pants, a wide-brimmed hat, and sunglasses whenever you are outside.  Heart disease, diabetes, and high blood pressure  High blood pressure causes heart disease and increases the risk of stroke. High blood pressure is more likely to develop in: ? People who have blood pressure in the high end of the normal range (130-139/85-89 mm Hg). ?  People who are overweight or obese. ? People who are African American.  If you are 63-43 years of age, have your blood pressure checked every 3-5 years. If you are 68 years of age or older, have your blood pressure checked every year. You should have your blood pressure measured twice-once when you are at a hospital or clinic, and once when you are not at a hospital or clinic. Record the average of the two measurements. To check your blood pressure when you are not at a hospital or clinic, you can use: ? An automated blood pressure machine at a pharmacy. ? A home blood pressure monitor.  If you are between 30 years and 13 years old, ask your health care provider if you should take  aspirin to prevent strokes.  Have regular diabetes screenings. This involves taking a blood sample to check your fasting blood sugar level. ? If you are at a normal weight and have a low risk for diabetes, have this test once every three years after 59 years of age. ? If you are overweight and have a high risk for diabetes, consider being tested at a younger age or more often. Preventing infection Hepatitis B  If you have a higher risk for hepatitis B, you should be screened for this virus. You are considered at high risk for hepatitis B if: ? You were born in a country where hepatitis B is common. Ask your health care provider which countries are considered high risk. ? Your parents were born in a high-risk country, and you have not been immunized against hepatitis B (hepatitis B vaccine). ? You have HIV or AIDS. ? You use needles to inject street drugs. ? You live with someone who has hepatitis B. ? You have had sex with someone who has hepatitis B. ? You get hemodialysis treatment. ? You take certain medicines for conditions, including cancer, organ transplantation, and autoimmune conditions.  Hepatitis C  Blood testing is recommended for: ? Everyone born from [birth year] through 1963. ? Anyone with known risk factors for hepatitis C.  Sexually transmitted infections (STIs)  You should be screened for sexually transmitted infections (STIs) including gonorrhea and chlamydia if: ? You are sexually active and are younger than 59 years of age. ? You are older than 59 years of age and your health care provider tells you that you are at risk for this type of infection. ? Your sexual activity has changed since you were last screened and you are at an increased risk for chlamydia or gonorrhea. Ask your health care provider if you are at risk.  If you do not have HIV, but are at risk, it may be recommended that you take a prescription medicine daily to prevent HIV infection. This is called  pre-exposure prophylaxis (PrEP). You are considered at risk if: ? You are sexually active and do not regularly use condoms or know the HIV status of your partner(s). ? You take drugs by injection. ? You are sexually active with a partner who has HIV.  Talk with your health care provider about whether you are at high risk of being infected with HIV. If you choose to begin PrEP, you should first be tested for HIV. You should then be tested every 3 months for as long as you are taking PrEP. Pregnancy  If you are premenopausal and you may become pregnant, ask your health care provider about preconception counseling.  If you may become pregnant, take 400 to 800 micrograms (mcg)  of folic acid every day.  If you want to prevent pregnancy, talk to your health care provider about birth control (contraception). Osteoporosis and menopause  Osteoporosis is a disease in which the bones lose minerals and strength with aging. This can result in serious bone fractures. Your risk for osteoporosis can be identified using a bone density scan.  If you are 76 years of age or older, or if you are at risk for osteoporosis and fractures, ask your health care provider if you should be screened.  Ask your health care provider whether you should take a calcium or vitamin D supplement to lower your risk for osteoporosis.  Menopause may have certain physical symptoms and risks.  Hormone replacement therapy may reduce some of these symptoms and risks. Talk to your health care provider about whether hormone replacement therapy is right for you. Follow these instructions at home:  Schedule regular health, dental, and eye exams.  Stay current with your immunizations.  Do not use any tobacco products including cigarettes, chewing tobacco, or electronic cigarettes.  If you are pregnant, do not drink alcohol.  If you are breastfeeding, limit how much and how often you drink alcohol.  Limit alcohol intake to no more  than 1 drink per day for nonpregnant women. One drink equals 12 ounces of beer, 5 ounces of wine, or 1 ounces of hard liquor.  Do not use street drugs.  Do not share needles.  Ask your health care provider for help if you need support or information about quitting drugs.  Tell your health care provider if you often feel depressed.  Tell your health care provider if you have ever been abused or do not feel safe at home. This information is not intended to replace advice given to you by your health care provider. Make sure you discuss any questions you have with your health care provider. Document Released: 08/21/2010 Document Revised: 07/14/2015 Document Reviewed: 11/09/2014 Elsevier Interactive Patient Education  [employer].

## 2016-12-14 ENCOUNTER — Ambulatory Visit (INDEPENDENT_AMBULATORY_CARE_PROVIDER_SITE_OTHER): Payer: PPO | Admitting: Physician Assistant

## 2016-12-14 ENCOUNTER — Encounter: Payer: Self-pay | Admitting: Physician Assistant

## 2016-12-14 VITALS — BP 104/68 | HR 66 | Ht 62.0 in | Wt 154.8 lb

## 2016-12-14 DIAGNOSIS — G4733 Obstructive sleep apnea (adult) (pediatric): Secondary | ICD-10-CM | POA: Diagnosis not present

## 2016-12-14 DIAGNOSIS — C482 Malignant neoplasm of peritoneum, unspecified: Secondary | ICD-10-CM

## 2016-12-14 DIAGNOSIS — E782 Mixed hyperlipidemia: Secondary | ICD-10-CM

## 2016-12-14 DIAGNOSIS — I2510 Atherosclerotic heart disease of native coronary artery without angina pectoris: Secondary | ICD-10-CM

## 2016-12-14 NOTE — Patient Instructions (Addendum)
Medication Instructions:  No changes.  Continue current medications.   Labwork: None   Testing/Procedures: None   Follow-Up: Your physician wants you to follow-up in: [place], [facility]  You will receive a reminder letter in the mail two months in advance. If you don't receive a letter, please call our office to schedule the follow-up appointment.    Any Other Special Instructions Will Be Listed Below (If Applicable). I will refer you to Dr. Baird Lyons for follow up on obstructive sleep apnea.  He is in the office where you used to see Dr. Gwenette Greet.  If you need a refill on your cardiac medications before your next appointment, please call your pharmacy.

## 2016-12-14 NOTE — Progress Notes (Signed)
Cardiology Office Note:    Date:  12/14/2016   ID:  [name], DOB [birth year], MRN [id number]  PCP:  [name], [name], MD  Cardiologist:  Dr. Dorris Carnes  Oncologist: Dr. Alen Blew  Referring MD: [name], [name], MD   Chief Complaint  Patient presents with  . Coronary Artery Disease    Follow-up    History of Present Illness:    [name] is a 61 y.o. female with a hx of coronary artery disease s/p non ST-elevation myocardial infarction in Sept 2017 treated with DES to LAD.  She had reduced LVF at her Cardiac Catheterization with ejection fraction 35-45.  EF improved to 60-65 on follow up echocardiogram.  Repeat cardiac catheterization 2 weeks later demonstrated patent LAD stent and mild to moderate nonobstructive disease elsewhere.  Other history includes invasive ductal carcinoma of the left breast and peritoneal adenocarcinoma of a GYN etiology.  Last seen by Dr. Harrington Challenger 4/18.  Her clopidogrel has been stopped since.  [name] returns for follow-up.  She is here alone today.  Since last seen, she has been doing fairly well.  She has occasional episodes of chest discomfort.  These are infrequent and do not seem to be worsening.  She is fatigued and short of breath at times.  She denies syncope, paroxysmal nocturnal dyspnea or lower extremity edema.  She has occasional rectal bleeding and is followed closely at the cancer center.  She does note a history of obstructive sleep apnea.  She was previously on CPAP but was taken off of this some years ago.  She is to see Dr. Gwenette Greet.  She does note worsening snoring recently.  Prior CV studies:   The following studies were reviewed today:  Cardiac catheterization 11/08/15 LAD distal stent patent LCx mid 30 OM2/OM3 minimal irregularities RCA minimal irregularities, RPDA ostial 60, RPLB2 30 EF 65  Echocardiogram 11/17/15  EF 60-65, normal wall motion, grade 1 diastolic dysfunction, trivial AI/MR  Echo 10/27/15 Mild concentric LVH, EF  60-65%, normal wall motion, grade 1 diastolic dysfunction, mild LAE, trivial TR, PASP 10 mmHg  LHC 10/26/15 LM ok LAD dist 99% LCx mid 30% RCA irregs; ost RPDA 40%, RPLB2 30% EF 35-45% PCI: 2.25 x 15 mm Xience Alpine DES to distal LAD 1. Severe one-vessel coronary artery disease affecting the distal LAD. Relatively small vessel post stenosis. 2. Moderately reduced LV systolic function with an ejection fraction of 35-40% with severe hypokinesis of the mid distal anterior wall and apical area. 3. Moderately elevated left ventricular end-diastolic pressure. 4. Successful angioplasty and drug-eluting stent placement to the distal LAD. Recommendations: Dual antiplatelet therapy for one year or at least 6 months. I started small dose carvedilol. Initiate treatment with an ACE inhibitor before hospital discharge.  Chest CTA 10/25/15 IMPRESSION: 1. No embolus or acute vascular findings identified in the chest. 2. Cirrhosis.  Venous Duplex 10/25/15 Summary: No evidence of deep vein thrombosis involving the left upper extremity.  Past Medical History:  Diagnosis Date  . ADD (attention deficit disorder)   . Allergy    seasonal  . Anxiety   . Bipolar disorder ([place])   . Breast cancer ([place]) 1989   right breast  . Cancer ([place])    Omentum  . Coronary artery disease   . Depression   . Hematochezia   . History of echocardiogram    a. Limited Echo 9/17: EF 60-65%, no RWMA, Gr 1 DD, trivial AI, trivial MR, GLS -12% (likely underestimated), no pericardial  effusion  . Hyperlipidemia   . Hypothyroidism   . Maintenance chemotherapy    Pt has chemo every 3 weeks (on Friday)  . NSTEMI (non-ST elevated myocardial infarction) ([place]) 10/26/2015  . Osteopenia 03/2009   t score -2.1 FRAX 4.6/0.4  . Premature menopause   . Sleep apnea    mild    Past Surgical History:  Procedure Laterality Date  . BREAST SURGERY  1989   RIGHT LUMPECTOMY, RADIATION AND CHEMO  . CARDIAC CATHETERIZATION N/A  10/26/2015   Procedure: Left Heart Cath and Coronary Angiography;  Surgeon: Wellington Hampshire, MD;  Location: [facility];  Service: Cardiovascular;  Laterality: N/A;  . CARDIAC CATHETERIZATION N/A 10/26/2015   Procedure: Coronary Stent Intervention;  Surgeon: Wellington Hampshire, MD;  Location: [facility];  Service: Cardiovascular;  Laterality: N/A;  . CARDIAC CATHETERIZATION N/A 11/08/2015   Procedure: Left Heart Cath and Coronary Angiography;  Surgeon: Jolaine Artist, MD;  Location: [facility];  Service: Cardiovascular;  Laterality: N/A;  . drug eluting stent  10/26/2015  . FOOT SURGERY  2013   BILATERAL   . HYSTEROSCOPY  2011   [place]  . PELVIC LAPAROSCOPY/ Hysteroscopy  1996    Current Medications: Current Meds  Medication Sig  . acetaminophen (TYLENOL) 325 MG tablet Take 2 tablets (650 mg total) by mouth every 4 (four) hours as needed for mild pain, fever or headache.  . Acetylcarnitine HCl (ACETYL L-CARNITINE PO) Take 2,000 mg by mouth at bedtime.  . ALPRAZolam (XANAX) 0.5 MG tablet Take 0.5 mg by mouth 2 (two) times daily as needed for anxiety.  Marland Kitchen aspirin 81 MG chewable tablet Chew 1 tablet (81 mg total) by mouth daily.  Marland Kitchen atorvastatin (LIPITOR) 40 MG tablet Take 1 tablet (40 mg total) by mouth daily at 6 PM.  . carvedilol (COREG) 3.125 MG tablet Take 1/2 tablet by mouth twice daily with meals.  . chlorhexidine (PERIDEX) 0.12 % solution RINSE W/1 CAPFUL FOR 30 SEC & EXPECTORATE. REPEAT TWICE DAILY FOR 7-10 DAYS  . clotrimazole-betamethasone (LOTRISONE) cream Apply 1 application topically 2 (two) times daily.  . divalproex (DEPAKOTE) 250 MG DR tablet Take by mouth. Take 500mg s in the morning and 750mg s at night  . fluticasone (FLONASE) 50 MCG/ACT nasal spray INHALE 2 SPRAYS INTO EACH NOSTRIL EVERY NIGHT  . gabapentin (NEURONTIN) 100 MG capsule Take 1 capsule by mouth twice daily.  Marland Kitchen lamoTRIgine (LAMICTAL) 100 MG tablet Take 100 mg by mouth 2 (two) times daily.  Marland Kitchen  levothyroxine (SYNTHROID, LEVOTHROID) 100 MCG tablet Take 1 tablet (100 mcg total) by mouth daily before breakfast.  . lidocaine-prilocaine (EMLA) cream Apply topically as needed. Apply to port with every chemotherapy.  . loperamide (IMODIUM A-D) 2 MG tablet Take 2 mg by mouth as needed for diarrhea or loose stools.  Marland Kitchen loratadine (CLARITIN) 10 MG tablet Take 10 mg by mouth daily as needed for allergies (with chemo).  . nitroGLYCERIN (NITROSTAT) 0.4 MG SL tablet Place 1 tablet (0.4 mg total) under the tongue every 5 (five) minutes as needed for chest pain.  Marland Kitchen ondansetron (ZOFRAN ODT) 8 MG disintegrating tablet Take 1 tablet (8 mg total) by mouth every 8 (eight) hours as needed for nausea or vomiting.  . ondansetron (ZOFRAN) 8 MG tablet TAKE 1 TABLET BY MOUTH EVERY 8 HOURS AS NEEDED FOR NAUSEA AND VOMITING  . pantoprazole (PROTONIX) 40 MG tablet Take 1 tablet (40 mg total) by mouth daily.  . prochlorperazine (COMPAZINE) 10  MG tablet Take 1 tablet (10 mg total) by mouth every 6 (six) hours as needed for nausea or vomiting.  Marland Kitchen rOPINIRole (REQUIP) 0.5 MG tablet Take 3 tablets (1.5 mg total) by mouth at bedtime.  . sertraline (ZOLOFT) 50 MG tablet      Allergies:   Doxil [doxorubicin hcl liposomal] and Pollen extract   Social History  Substance Use Topics  . Smoking status: Never Smoker  . Smokeless tobacco: Never Used  . Alcohol use 0.0 oz/week     Comment: very rarely - maybe 1 glass wine every 3 mos     Family Hx: The patient's family history includes Allergies in her father; Breast cancer in her maternal aunt and maternal aunt; Breast cancer (age of onset: 75) in her maternal aunt; Cancer in her cousin and cousin; Diabetes in her maternal grandmother; Heart Problems in her paternal grandfather; Heart disease in her maternal grandfather, maternal grandmother, and mother; Hypertension in her paternal grandfather; Liver cancer (age of onset: 43) in her cousin; Other in her mother. There is no  history of Ovarian cancer or Colon cancer.  ROS:   Please see the history of present illness.    ROS All other systems reviewed and are negative.   EKGs/Labs/Other Test Reviewed:    EKG:  EKG is  ordered today.  The ekg ordered today demonstrates normal sinus rhythm, heart rate 66, normal axis, nonspecific ST-T wave changes, QTC 440 ms  Recent Labs: 11/06/2016: TSH 1.55 12/10/2016: ALT <6; BUN 10.8; Creatinine 0.7; HGB 9.2; Platelets 62; Potassium 3.8; Sodium 142   Recent Lipid Panel Lab Results  Component Value Date/Time   CHOL 120 04/16/2016 08:06 AM   TRIG 153.0 (H) 04/16/2016 08:06 AM   HDL 34.90 (L) 04/16/2016 08:06 AM   CHOLHDL 3 04/16/2016 08:06 AM   LDLCALC 54 04/16/2016 08:06 AM   LDLDIRECT 151.3 08/24/2009 08:03 AM    Physical Exam:    VS:  BP 104/68   Pulse 66   Ht 5\' 2"  (1.575 m)   Wt 154 lb 12.8 oz (70.2 kg)   SpO2 91%   BMI 28.31 kg/m     Wt Readings from Last 3 Encounters:  12/14/16 154 lb 12.8 oz (70.2 kg)  12/13/16 155 lb (70.3 kg)  12/10/16 155 lb 12.8 oz (70.7 kg)     Physical Exam  Constitutional: She is oriented to person, place, and time. She appears well-developed and well-nourished. No distress.  HENT:  Head: Normocephalic and atraumatic.  Neck: No JVD present.  Cardiovascular: Normal rate and regular rhythm.   No murmur heard. Pulmonary/Chest: Effort normal. She has no rales.  Abdominal: Soft.  Musculoskeletal: She exhibits no edema.  Neurological: She is alert and oriented to person, place, and time.  Skin: Skin is warm and dry.  Psychiatric: She has a normal mood and affect.    ASSESSMENT:    1. Coronary artery disease involving native coronary artery of native heart without angina pectoris   2. Primary peritoneal carcinomatosis ([place])   3. Mixed hyperlipidemia   4. OSA (obstructive sleep apnea)    PLAN:    In order of problems listed above:  1.  Coronary artery disease involving native coronary artery of native heart  without angina pectoris  History of non-ST elevation myocardial infarction in September 2017 treated with drug-eluting stent to the LAD.  Follow-up cardiac catheterization 2 weeks later demonstrated patent stent and 60% distal RCA stenosis.  She has occasional chest discomfort but this is infrequent.  We discussed earlier follow-up if she develops more frequent or worsening chest discomfort.  Continue aspirin, Lipitor, beta-blocker.  2.  Primary peritoneal carcinomatosis [facility]) Follow-up with oncology as planned.  3.  Mixed hyperlipidemia LDL optimal on most recent lab work.  Continue current Rx.    4.  OSA (obstructive sleep apnea) She is concerned about her snoring.  She was taken off CPAP years ago.  She previously saw Dr. Gwenette Greet at [facility].  I will refer her to Dr. Annamaria Boots for follow-up.   Dispo:  Return in about 6 months (around 06/14/2017) for Routine Follow Up, w/ Richardson Dopp, PA-C.   Medication Adjustments/Labs and Tests Ordered: Current medicines are reviewed at length with the patient today.  Concerns regarding medicines are outlined above.  Tests Ordered: Orders Placed This Encounter  Procedures  . Ambulatory referral to Pulmonology  . EKG 12-Lead   Medication Changes: No orders of the defined types were placed in this encounter.   Signed, Richardson Dopp, PA-C  12/14/2016 1:06 PM    [facility] [address], [place], [place]  [address] Phone: [phone]; Fax: [phone]

## 2016-12-26 ENCOUNTER — Encounter: Payer: PPO | Admitting: Gynecology

## 2016-12-27 DIAGNOSIS — Z79899 Other long term (current) drug therapy: Secondary | ICD-10-CM | POA: Diagnosis not present

## 2016-12-28 ENCOUNTER — Ambulatory Visit (HOSPITAL_COMMUNITY)
Admission: RE | Admit: 2016-12-28 | Discharge: 2016-12-28 | Disposition: A | Discharge status: Home / Self Care | Payer: PPO | Source: Ambulatory Visit | Attending: Oncology | Admitting: Oncology

## 2016-12-28 DIAGNOSIS — R590 Localized enlarged lymph nodes: Secondary | ICD-10-CM | POA: Insufficient documentation

## 2016-12-28 DIAGNOSIS — R932 Abnormal findings on diagnostic imaging of liver and biliary tract: Secondary | ICD-10-CM | POA: Insufficient documentation

## 2016-12-28 DIAGNOSIS — I700 Atherosclerosis of aorta: Secondary | ICD-10-CM | POA: Insufficient documentation

## 2016-12-28 DIAGNOSIS — C482 Malignant neoplasm of peritoneum, unspecified: Secondary | ICD-10-CM

## 2016-12-28 MED ORDER — IOPAMIDOL (ISOVUE-300) INJECTION 61%
100.0000 mL | Freq: Once | INTRAVENOUS | Status: AC | PRN
  Administered 2016-12-28: 100 mL via INTRAVENOUS

## 2016-12-28 MED ORDER — IOPAMIDOL (ISOVUE-300) INJECTION 61%
30.0000 mL | Freq: Once | INTRAVENOUS | Status: AC | PRN
  Administered 2016-12-28: 30 mL via ORAL

## 2016-12-28 MED ORDER — IOPAMIDOL (ISOVUE-300) INJECTION 61%
INTRAVENOUS | Status: AC
  Filled 2016-12-28: qty 100

## 2016-12-28 MED ORDER — IOPAMIDOL (ISOVUE-300) INJECTION 61%
INTRAVENOUS | Status: AC
  Filled 2016-12-28: qty 30

## 2016-12-31 ENCOUNTER — Ambulatory Visit (HOSPITAL_BASED_OUTPATIENT_CLINIC_OR_DEPARTMENT_OTHER): Payer: PPO | Admitting: Oncology

## 2016-12-31 ENCOUNTER — Ambulatory Visit (HOSPITAL_BASED_OUTPATIENT_CLINIC_OR_DEPARTMENT_OTHER): Payer: PPO

## 2016-12-31 ENCOUNTER — Ambulatory Visit: Payer: PPO

## 2016-12-31 ENCOUNTER — Other Ambulatory Visit (HOSPITAL_BASED_OUTPATIENT_CLINIC_OR_DEPARTMENT_OTHER): Payer: PPO

## 2016-12-31 VITALS — BP 127/45 | HR 67 | Temp 98.1°F | Resp 18 | Ht 62.0 in | Wt 155.9 lb

## 2016-12-31 DIAGNOSIS — Z170 Estrogen receptor positive status [ER+]: Secondary | ICD-10-CM | POA: Diagnosis not present

## 2016-12-31 DIAGNOSIS — C170 Malignant neoplasm of duodenum: Secondary | ICD-10-CM

## 2016-12-31 DIAGNOSIS — Z5111 Encounter for antineoplastic chemotherapy: Secondary | ICD-10-CM | POA: Diagnosis not present

## 2016-12-31 DIAGNOSIS — C482 Malignant neoplasm of peritoneum, unspecified: Secondary | ICD-10-CM

## 2016-12-31 DIAGNOSIS — C50412 Malignant neoplasm of upper-outer quadrant of left female breast: Secondary | ICD-10-CM | POA: Diagnosis not present

## 2016-12-31 LAB — COMPREHENSIVE METABOLIC PANEL
ALBUMIN: 3.4 g/dL — AB (ref 3.5–5.0)
ALT: 8 U/L (ref 0–55)
AST: 15 U/L (ref 5–34)
Alkaline Phosphatase: 76 U/L (ref 40–150)
Anion Gap: 7 mEq/L (ref 3–11)
BUN: 12.8 mg/dL (ref 7.0–26.0)
CHLORIDE: 109 meq/L (ref 98–109)
CO2: 27 mEq/L (ref 22–29)
CREATININE: 0.7 mg/dL (ref 0.6–1.1)
Calcium: 8.9 mg/dL (ref 8.4–10.4)
EGFR: >60 mL/min/{1.73_m2} (ref >60)
GLUCOSE: 95 mg/dL (ref 70–140)
POTASSIUM: 4.3 meq/L (ref 3.5–5.1)
SODIUM: 142 meq/L (ref 136–145)
Total Bilirubin: 0.27 mg/dL (ref 0.20–1.20)
Total Protein: 7.1 g/dL (ref 6.4–8.3)

## 2016-12-31 LAB — CBC WITH DIFFERENTIAL/PLATELET
BASO%: 0.4 % (ref 0.0–2.0)
Basophils Absolute: 0 10*3/uL (ref 0.0–0.1)
EOS ABS: 0 10*3/uL (ref 0.0–0.5)
EOS%: 0.7 % (ref 0.0–7.0)
HCT: 29.8 % — ABNORMAL LOW (ref 34.8–46.6)
HGB: 9.3 g/dL — ABNORMAL LOW (ref 11.6–15.9)
LYMPH%: 29.6 % (ref 14.0–49.7)
MCH: 31.8 pg (ref 25.1–34.0)
MCHC: 31.1 g/dL — AB (ref 31.5–36.0)
MCV: 102.3 fL — AB (ref 79.5–101.0)
MONO#: 0.4 10*3/uL (ref 0.1–0.9)
MONO%: 11.5 % (ref 0.0–14.0)
NEUT#: 1.9 10*3/uL (ref 1.5–6.5)
NEUT%: 57.8 % (ref 38.4–76.8)
PLATELETS: 71 10*3/uL — AB (ref 145–400)
RBC: 2.92 10*6/uL — AB (ref 3.70–5.45)
RDW: 16.7 % — ABNORMAL HIGH (ref 11.2–14.5)
WBC: 3.2 10*3/uL — ABNORMAL LOW (ref 3.9–10.3)
lymph#: 1 10*3/uL (ref 0.9–3.3)

## 2016-12-31 MED ORDER — SODIUM CHLORIDE 0.9% FLUSH
10.0000 mL | INTRAVENOUS | Status: DC | PRN
  Administered 2016-12-31: 10 mL
  Filled 2016-12-31: qty 10

## 2016-12-31 MED ORDER — SODIUM CHLORIDE 0.9 % IV SOLN
Freq: Once | INTRAVENOUS | Status: AC
  Administered 2016-12-31: 11:00:00 via INTRAVENOUS

## 2016-12-31 MED ORDER — PALONOSETRON HCL INJECTION 0.25 MG/5ML
INTRAVENOUS | Status: AC
  Filled 2016-12-31: qty 5

## 2016-12-31 MED ORDER — SODIUM CHLORIDE 0.9 % IV SOLN
570.0000 mg | Freq: Once | INTRAVENOUS | Status: AC
  Administered 2016-12-31: 570 mg via INTRAVENOUS
  Filled 2016-12-31: qty 57

## 2016-12-31 MED ORDER — PALONOSETRON HCL INJECTION 0.25 MG/5ML
0.2500 mg | Freq: Once | INTRAVENOUS | Status: AC
  Administered 2016-12-31: 0.25 mg via INTRAVENOUS

## 2016-12-31 MED ORDER — DEXAMETHASONE SODIUM PHOSPHATE 10 MG/ML IJ SOLN
INTRAMUSCULAR | Status: AC
  Filled 2016-12-31: qty 1

## 2016-12-31 MED ORDER — DEXAMETHASONE SODIUM PHOSPHATE 10 MG/ML IJ SOLN
4.0000 mg | Freq: Once | INTRAMUSCULAR | Status: AC
  Administered 2016-12-31: 4 mg via INTRAVENOUS

## 2016-12-31 MED ORDER — CARBOPLATIN CHEMO INTRADERMAL TEST DOSE 100MCG/0.02ML
100.0000 ug | Freq: Once | INTRADERMAL | Status: AC
  Administered 2016-12-31: 100 ug via INTRADERMAL
  Filled 2016-12-31: qty 0.02

## 2016-12-31 MED ORDER — HEPARIN SOD (PORK) LOCK FLUSH 100 UNIT/ML IV SOLN
500.0000 [IU] | Freq: Once | INTRAVENOUS | Status: AC | PRN
  Administered 2016-12-31: 500 [IU]
  Filled 2016-12-31: qty 5

## 2016-12-31 NOTE — Patient Instructions (Signed)
Implanted Port Home Guide An implanted port is a type of central line that is placed under the skin. Central lines are used to provide IV access when treatment or nutrition needs to be given through a person's veins. Implanted ports are used for long-term IV access. An implanted port may be placed because:  You need IV medicine that would be irritating to the small veins in your hands or arms.  You need long-term IV medicines, such as antibiotics.  You need IV nutrition for a long period.  You need frequent blood draws for lab tests.  You need dialysis.  Implanted ports are usually placed in the chest area, but they can also be placed in the upper arm, the abdomen, or the leg. An implanted port has two main parts:  Reservoir. The reservoir is round and will appear as a small, raised area under your skin. The reservoir is the part where a needle is inserted to give medicines or draw blood.  Catheter. The catheter is a thin, flexible tube that extends from the reservoir. The catheter is placed into a large vein. Medicine that is inserted into the reservoir goes into the catheter and then into the vein.  How will I care for my incision site? Do not get the incision site wet. Bathe or shower as directed by your health care provider. How is my port accessed? Special steps must be taken to access the port:  Before the port is accessed, a numbing cream can be placed on the skin. This helps numb the skin over the port site.  Your health care provider uses a sterile technique to access the port. ? Your health care provider must put on a mask and sterile gloves. ? The skin over your port is cleaned carefully with an antiseptic and allowed to dry. ? The port is gently pinched between sterile gloves, and a needle is inserted into the port.  Only "non-coring" port needles should be used to access the port. Once the port is accessed, a blood return should be checked. This helps ensure that the port  is in the vein and is not clogged.  If your port needs to remain accessed for a constant infusion, a clear (transparent) bandage will be placed over the needle site. The bandage and needle will need to be changed every week, or as directed by your health care provider.  Keep the bandage covering the needle clean and dry. Do not get it wet. Follow your health care provider's instructions on how to take a shower or bath while the port is accessed.  If your port does not need to stay accessed, no bandage is needed over the port.  What is flushing? Flushing helps keep the port from getting clogged. Follow your health care provider's instructions on how and when to flush the port. Ports are usually flushed with saline solution or a medicine called heparin. The need for flushing will depend on how the port is used.  If the port is used for intermittent medicines or blood draws, the port will need to be flushed: ? After medicines have been given. ? After blood has been drawn. ? As part of routine maintenance.  If a constant infusion is running, the port may not need to be flushed.  How long will my port stay implanted? The port can stay in for as long as your health care provider thinks it is needed. When it is time for the port to come out, surgery will be   done to remove it. The procedure is similar to the one performed when the port was put in. When should I seek immediate medical care? When you have an implanted port, you should seek immediate medical care if:  You notice a bad smell coming from the incision site.  You have swelling, redness, or drainage at the incision site.  You have more swelling or pain at the port site or the surrounding area.  You have a fever that is not controlled with medicine.  This information is not intended to replace advice given to you by your health care provider. Make sure you discuss any questions you have with your health care provider. Document  Released: 02/05/2005 Document Revised: 07/14/2015 Document Reviewed: 10/13/2012 Elsevier Interactive Patient Education  2017 [employer]  

## 2016-12-31 NOTE — Progress Notes (Signed)
Per Dr. Alen Blew, it's OK to treat with a platelet count of 71 today. Infusion room nurse notified.

## 2016-12-31 NOTE — Progress Notes (Signed)
Hematology and Oncology Follow Up Visit  [name] [id number] Nov 13, 1955 61 y.o. 12/31/2016 9:50 AM    Principle Diagnosis: 61 year old woman diagnosed with:  1. 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 This was diagnosed in July of 2014. 2. 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 The tumor is ER positive PR negative, HER-2 negative.   Prior Therapy:  She is status post paracentesis performed on 09/17/2012 with the cytology confirmed the presence of adenocarcinoma and immunohistochemical stains suggest GYN etiology. She is also status post lumpectomy for breast cancer diagnosed 25 years ago followed by radiation and chemotherapy under the care of Dr. Sonny Padilla. She did not receive any hormonal therapy. Chemotherapy utilizing carboplatin and Taxotere started on 10/01/2012. Avastin was added with cycle 4. This was discontinued in June of 2015. She is S/P Avastin maintenance only between June 2015 till March 2017. Therapy discontinued due to progression of disease. She is status post Doxil salvage chemotherapy therapy discontinued because of hypersensitivity reaction in March 2017.  Current therapy:  Carboplatin and AUC of 5 every 3 weeks cycle 1 started on 07/08/2015. She is here for her next cycle of therapy.  Interim History: [name] presents today for a followup visit. Since her last visit, she reports no changes in her health.  She continues to receive chemotherapy without specific complications. She denied any infusion-related complications to chemotherapy. She denied any vomiting or bleeding. She does have mild nausea that is managed by Zofran. She does not report any abdominal pain or early satiety. She denied any petechiae or bleeding.  She continues to have issues with fatigue periodically but for the most part manageable.  She does not report  any headaches, blurry vision, syncope or seizures. She denied any fever chills. She does report some chest discomfort but no dyspnea on exertion. Has not reported any shortness of breath or cough. She denied any pain with swallowing. She does not report any vomiting, constipation does report some occasional diarrhea. She denied any GYN bleeding. She reports no anxiety or depression. Remainder of the review of systems is unremarkable.  Medications: I have reviewed the patient's current medications.  Current Outpatient Medications  Medication Sig Dispense Refill  . acetaminophen (TYLENOL) 325 MG tablet Take 2 tablets (650 mg total) by mouth every 4 (four) hours as needed for mild pain, fever or headache. 30 tablet 0  . Acetylcarnitine HCl (ACETYL L-CARNITINE PO) Take 2,000 mg by mouth at bedtime.    . ALPRAZolam (XANAX) 0.5 MG tablet Take 0.5 mg by mouth 2 (two) times daily as needed for anxiety.    Marland Kitchen aspirin 81 MG chewable tablet Chew 1 tablet (81 mg total) by mouth daily. 30 tablet 0  . atorvastatin (LIPITOR) 40 MG tablet Take 1 tablet (40 mg total) by mouth daily at 6 PM. 90 tablet 3  . carvedilol (COREG) 3.125 MG tablet Take 1/2 tablet by mouth twice daily with meals. 30 tablet 9  . chlorhexidine (PERIDEX) 0.12 % solution RINSE W/1 CAPFUL FOR 30 SEC & EXPECTORATE. REPEAT TWICE DAILY FOR 7-10 DAYS  0  . clotrimazole-betamethasone (LOTRISONE) cream Apply 1 application topically 2 (two) times daily. 30 g 1  . divalproex (DEPAKOTE) 250 MG DR tablet Take by mouth. Take 5103ms in the morning and 7570m at night    . fluticasone (FLONASE) 50 MCG/ACT nasal spray [facility]ALE 2 [facility]  NOSTR[facility] EVERY NIGHT  11  . gabapentin (NEURONTIN) 100 MG capsule Take 1 capsule by mouth twice daily. 180 capsule 3  . lamoTRIgine (LAMICTAL) 100 MG tablet Take 100 mg by mouth 2 (two) times daily.    Marland Kitchen levothyroxine (SYNTHROID, LEVOTHROID) 100 MCG tablet Take 1 tablet (100 mcg total) by mouth daily before breakfast. 90  tablet 3  . lidocaine-prilocaine (EMLA) cream Apply topically as needed. Apply to port with every chemotherapy. 30 g 0  . loperamide (IMODIUM A-D) 2 MG tablet Take 2 mg by mouth as needed for diarrhea or loose stools.    Marland Kitchen loratadine (CLARITIN) 10 MG tablet Take 10 mg by mouth daily as needed for allergies (with chemo).    . nitroGLYCERIN (NITROSTAT) 0.4 MG SL tablet Place 1 tablet (0.4 mg total) under the tongue every 5 (five) minutes as needed for chest pain. 25 tablet 3  . ondansetron (ZOFRAN ODT) 8 MG disintegrating tablet Take 1 tablet (8 mg total) by mouth every 8 (eight) hours as needed for nausea or vomiting. 20 tablet 1  . ondansetron (ZOFRAN) 8 MG tablet TAKE 1 TABLET BY MOUTH EVERY 8 HOURS AS NEEDED FOR NAUSEA AND VOMITING 30 tablet 2  . pantoprazole (PROTONIX) 40 MG tablet Take 1 tablet (40 mg total) by mouth daily. 90 tablet 1  . prochlorperazine (COMPAZINE) 10 MG tablet Take 1 tablet (10 mg total) by mouth every 6 (six) hours as needed for nausea or vomiting. 30 tablet 0  . rOPINIRole (REQUIP) 0.5 MG tablet Take 3 tablets (1.5 mg total) by mouth at bedtime. 135 tablet 1  . sertraline (ZOLOFT) 50 MG tablet      No current facility-administered medications for this visit.    Facility-Administered Medications Ordered in Other Visits  Medication Dose Route Frequency Provider Last Rate Last Dose  . heparin lock flush 100 unit/mL  500 Units Intravenous Once Julie Portela, MD      . sodium chloride flush (NS) 0.9 % injection 10 mL  10 mL Intravenous PRN Julie Portela, MD         Allergies:  Allergies  Allergen Reactions  . Doxil [Doxorubicin Hcl Liposomal] Anaphylaxis    1st Doxil.   . Pollen Extract Other (See Comments)    Pollen and grass causes a lot sneezing    Past Medical History, Surgical history, Social history, and Family History were reviewed and updated.   Physical Exam: Blood pressure (!) 127/45, pulse 67, temperature 98.1 F (36.7 C), temperature source  Oral, resp. rate 18, height _0  (1.575 m), weight 155 lb 14.4 oz (70.7 kg), SpO2 100 %.  ECOG: 1 General appearance: Well-appearing woman without distress. Head: Normocephalic, without obvious abnormality. No oral ulcers or lesions. Neck: no adenopathy or masses. Lymph nodes: Cervical, supraclavicular, and axillary nodes normal. Heart:regular rate and rhythm, S1, S2 normal, no murmur, click, rub or gallop Lung:chest clear, no wheezing, rales, normal symmetric. Chest wall examination:  Port-A-Cath site without any abnormalities or erythema. Abdomin: Soft, nontender with good bowel sounds. No rebound or guarding. EXT:no erythema, induration, or nodules. No edema. Skin: No rashes or lesions. No ecchymosis.  Lab Results: Lab Results  Component Value Date   WBC 2.8 (L) 12/10/2016   HGB 9.2 (L) 12/10/2016   HCT 29.3 (L) 12/10/2016   MCV 103.2 (H) 12/10/2016   PLT 62 (L) 12/10/2016     Chemistry      Component Value Date/Time   NA 142 12/10/2016 0939   K 3.8  12/10/2016 0939   CL 104 04/16/2016 0806   CO2 26 12/10/2016 0939   BUN 10.8 12/10/2016 0939   CREATININE 0.7 12/10/2016 0939      Component Value Date/Time   CALCIUM 8.9 12/10/2016 0939   ALKPHOS 75 12/10/2016 0939   AST 11 12/10/2016 0939   ALT <6 12/10/2016 0939   BILITOT 0.24 12/10/2016 0939      Results for [name], [name] (MRN [id number]) as of 12/31/2016 09:21  Ref. Range 11/21/2016 09:37 12/10/2016 09:39  Cancer Antigen (CA) 125 Latest Ref Range: 0.0 - 38.1 U/mL 26.3 24.4    EXAM: CT CHEST, ABDOMEN, AND PELVIS WITH CONTRAST  TECHNIQUE: Multidetector CT imaging of the chest, abdomen and pelvis was performed following the standard protocol during bolus administration of intravenous contrast.  CONTRAST:  134m ISOVUE-300 IOPAMIDOL (ISOVUE-300) INJECTION 61%, 359mISOVUE-300 IOPAMIDOL (ISOVUE-300) INJECTION 61%  COMPARISON:  06/25/2016  FINDINGS: CT CHEST FINDINGS  Cardiovascular: The heart size  is normal. No pericardial effusion. Coronary artery calcification is evident. Atherosclerotic calcification is noted in the wall of the thoracic aorta.  Mediastinum/Nodes: No mediastinal lymphadenopathy. There is no hilar lymphadenopathy. The esophagus has normal imaging features. There is no axillary lymphadenopathy.  Lungs/Pleura: Lungs are clear bilaterally.  Musculoskeletal: Bone windows reveal no worrisome lytic or sclerotic osseous lesions.  CT ABDOMEN PELVIS FINDINGS  Hepatobiliary: Nodular hepatic contour again noted. No focal abnormality within the liver parenchyma. There is no evidence for gallstones, gallbladder wall thickening, or pericholecystic fluid. No intrahepatic or extrahepatic biliary dilation.  Pancreas: No focal mass lesion. No dilatation of the main duct. No intraparenchymal cyst. No peripancreatic edema.  Spleen: No splenomegaly. No focal mass lesion.  Adrenals/Urinary Tract: No adrenal nodule or mass. Tiny hypoattenuating lesion interpolar right kidney is stable, likely a cyst. Left kidney unremarkable. No evidence for hydroureter. The urinary bladder appears normal for the degree of distention.  Stomach/Bowel: Stomach is nondistended. No gastric wall thickening. No evidence of outlet obstruction. Duodenum is normally positioned as is the ligament of Treitz. No small bowel wall thickening. No small bowel dilatation. The appendix is not visualized, but there is no edema or inflammation in the region of the cecum. No gross colonic mass. No colonic wall thickening. No substantial diverticular change.  Vascular/Lymphatic: There is abdominal aortic atherosclerosis without aneurysm. No gastrohepatic or hepatoduodenal ligament lymphadenopathy. Interval development of mild para-aortic lymphadenopathy noted. 12 mm short axis left para-aortic lymph node is identified on image 66 series 2. 10 mm short axis retrocaval lymph node seen on image 61.  Other similar retroperitoneal lymph nodes are evident. Patient has developed mild lymphadenopathy in the right common iliac chain. 12 mm short axis lymph node is seen image 78. 9 mm irregular lymph node identified on image 75. 18 mm short axis necrotic right pelvic sidewall lymph node on image 95 represents the small right pelvic sidewall lymph node of concern on the prior study.  Reproductive: The uterus has normal CT imaging appearance. There is no adnexal mass.  Other: No intraperitoneal free fluid. Midline omental mass appears similar to prior study. Left upper quadrant omental lesion in the splenic flexure is also similar, measuring 10 mm in thickness today which is stable since prior study. There is a similar amount of abnormal soft tissue inferior to the right liver.  Musculoskeletal: Bone windows reveal no worrisome lytic or sclerotic osseous lesions.  IMPRESSION: 1. Interval development of irregular borderline to mildly enlarged retroperitoneal lymph nodes in the abdomen, compatible with metastatic disease. 2.  Right pelvic sidewall lymph node of concern on the prior study has progressed and now appears centrally necrotic, measuring 18 mm short axis. Additional mildly enlarged right common iliac lymph nodes are new in the interval. These features a concerning for metastatic disease in the right hemipelvis. 3. Relatively stable appearance of the omental disease characterized previously. 4. Stable nodular liver contour raising concern for cirrhosis. 5. Abdominal aortic atherosclerosis.    Impression and Plan:  61 year old woman with the following issues:  1. Peritoneal carcinomatosis with omental involvement. The pathology confirmed the presence of adenocarcinoma of likely GYN etiology. Her CA 125 was elevated at 333.5 on 09/19/2012. She is status post systemic chemotherapy with an excellent response utilizing carboplatin and Taxotere and a Avastin . This therapy was  held in June of 2015 and currently on Avastin maintenance.   CT scan obtained on 05/05/2015 as well as her tumor markers showed clear progression. Her CA-125 is up to 189 and her CT scan showed increase in her peritoneal disease.   She is currently receiving salvage therapy with carboplatin single agent at AUC of 5 with a reasonable response to therapy.  CT scan obtained on 12/28/2016 was personally reviewed and discussed with the patient.  Her disease is overall stable although there are borderline enlarged lymph nodes.  Her CA-125 continues to be stable and slightly declining.  Extent benefits of continuing this therapy was discussed today and she is agreeable to continue.  We will proceed with treatment today and in 3 weeks and she will have a treatment break over the holidays.  2. IV access: Port-A-Cath is without complications.  No issues reported.  3. Thrombocytopenia: Her platelets remained relatively stable without bleeding.  4. Abdominal distention: Stable at this time without any recent complaints.  5. Left breast mass: She status post biopsy with the pathology indicating T2 N1 disease with the tumor have ER positive, PR negative HER-2 negative. Her Ki-67 is 50%.  Mammogram obtained on 03/20/2016 showed excellent response to systemic chemotherapy. We'll continue to follow with periodic mammography.  8. Neutropenia: Neulasta will be given after a cycles of therapy.   9. Nausea: We have discussed strategies to manage this including antiemetics prior to developing nausea, and others.  10. Followup: She will follow-up in 3 weeks for chemotherapy infusion.     Zola Button, MD 11/12/20189:50 AM

## 2016-12-31 NOTE — Patient Instructions (Addendum)
Carboplatin injection What is this medicine? CARBOPLATIN (KAR boe pla tin) is a chemotherapy drug. It targets fast dividing cells, like cancer cells, and causes these cells to die. This medicine is used to treat ovarian cancer and many other cancers. This medicine may be used for other purposes; ask your health care provider or pharmacist if you have questions. COMMON BRAND NAME(S): Paraplatin What should I tell my health care provider before I take this medicine? They need to know if you have any of these conditions: -blood disorders -hearing problems -kidney disease -recent or ongoing radiation therapy -an unusual or allergic reaction to carboplatin, cisplatin, other chemotherapy, other medicines, foods, dyes, or preservatives -pregnant or trying to get pregnant -breast-feeding How should I use this medicine? This drug is usually given as an infusion into a vein. It is administered in a hospital or clinic by a specially trained health care professional. Talk to your pediatrician regarding the use of this medicine in children. Special care may be needed. Overdosage: If you think you have taken too much of this medicine contact a poison control center or emergency room at once. NOTE: This medicine is only for you. Do not share this medicine with others. What if I miss a dose? It is important not to miss a dose. Call your doctor or health care professional if you are unable to keep an appointment. What may interact with this medicine? -medicines for seizures -medicines to increase blood counts like filgrastim, pegfilgrastim, sargramostim -some antibiotics like amikacin, gentamicin, neomycin, streptomycin, tobramycin -vaccines Talk to your doctor or health care professional before taking any of these medicines: -acetaminophen -aspirin -ibuprofen -ketoprofen -naproxen This list may not describe all possible interactions. Give your health care provider a list of all the medicines, herbs,  non-prescription drugs, or dietary supplements you use. Also tell them if you smoke, drink alcohol, or use illegal drugs. Some items may interact with your medicine. What should I watch for while using this medicine? Your condition will be monitored carefully while you are receiving this medicine. You will need important blood work done while you are taking this medicine. This drug may make you feel generally unwell. This is not uncommon, as chemotherapy can affect healthy cells as well as cancer cells. Report any side effects. Continue your course of treatment even though you feel ill unless your doctor tells you to stop. In some cases, you may be given additional medicines to help with side effects. Follow all directions for their use. Call your doctor or health care professional for advice if you get a fever, chills or sore throat, or other symptoms of a cold or flu. Do not treat yourself. This drug decreases your body's ability to fight infections. Try to avoid being around people who are sick. This medicine may increase your risk to bruise or bleed. Call your doctor or health care professional if you notice any unusual bleeding. Be careful brushing and flossing your teeth or using a toothpick because you may get an infection or bleed more easily. If you have any dental work done, tell your dentist you are receiving this medicine. Avoid taking products that contain aspirin, acetaminophen, ibuprofen, naproxen, or ketoprofen unless instructed by your doctor. These medicines may hide a fever. Do not become pregnant while taking this medicine. Women should inform their doctor if they wish to become pregnant or think they might be pregnant. There is a potential for serious side effects to an unborn child. Talk to your health care professional or  pharmacist for more information. Do not breast-feed an infant while taking this medicine. What side effects may I notice from receiving this medicine? Side effects  that you should report to your doctor or health care professional as soon as possible: -allergic reactions like skin rash, itching or hives, swelling of the face, lips, or tongue -signs of infection - fever or chills, cough, sore throat, pain or difficulty passing urine -signs of decreased platelets or bleeding - bruising, pinpoint red spots on the skin, black, tarry stools, nosebleeds -signs of decreased red blood cells - unusually weak or tired, fainting spells, lightheadedness -breathing problems -changes in hearing -changes in vision -chest pain -high blood pressure -low blood counts - This drug may decrease the number of white blood cells, red blood cells and platelets. You may be at increased risk for infections and bleeding. -nausea and vomiting -pain, swelling, redness or irritation at the injection site -pain, tingling, numbness in the hands or feet -problems with balance, talking, walking -trouble passing urine or change in the amount of urine Side effects that usually do not require medical attention (report to your doctor or health care professional if they continue or are bothersome): -hair loss -loss of appetite -metallic taste in the mouth or changes in taste This list may not describe all possible side effects. Call your doctor for medical advice about side effects. You may report side effects to FDA at 1-800-FDA-1088. Where should I keep my medicine? This drug is given in a hospital or clinic and will not be stored at home. NOTE: This sheet is a summary. It may not cover all possible information. If you have questions about this medicine, talk to your doctor, pharmacist, or health care provider.  2018 Elsevier/Gold Standard (2007-05-13 14:38:05)  [facility] Discharge Instructions for Patients Receiving Chemotherapy  Today you received the following chemotherapy agents: Carboplatin (Paraplatin)  To help prevent nausea and vomiting after your treatment,  we encourage you to take your nausea medication as prescribed. If you develop nausea and vomiting that is not controlled by your nausea medication, call the clinic.   BELOW ARE SYMPTOMS THAT SHOULD BE REPORTED IMMEDIATELY:  *FEVER GREATER THAN 100.5 F  *CHILLS WITH OR WITHOUT FEVER  NAUSEA AND VOMITING THAT IS NOT CONTROLLED WITH YOUR NAUSEA MEDICATION  *UNUSUAL SHORTNESS OF BREATH  *UNUSUAL BRUISING OR BLEEDING  TENDERNESS IN MOUTH AND THROAT WITH OR WITHOUT PRESENCE OF ULCERS  *URINARY PROBLEMS  *BOWEL PROBLEMS  UNUSUAL RASH Items with * indicate a potential emergency and should be followed up as soon as possible.  Feel free to call the clinic should you have any questions or concerns. The clinic phone number is [phone].  Please show the [place] at check-in to the Emergency Department and triage nurse.

## 2017-01-01 ENCOUNTER — Ambulatory Visit (HOSPITAL_BASED_OUTPATIENT_CLINIC_OR_DEPARTMENT_OTHER): Payer: PPO

## 2017-01-01 VITALS — BP 124/66 | HR 56 | Temp 98.1°F | Resp 18

## 2017-01-01 DIAGNOSIS — C482 Malignant neoplasm of peritoneum, unspecified: Secondary | ICD-10-CM

## 2017-01-01 DIAGNOSIS — Z5189 Encounter for other specified aftercare: Secondary | ICD-10-CM

## 2017-01-01 LAB — CA 125: Cancer Antigen (CA) 125: 25.2 U/mL (ref 0.0–38.1)

## 2017-01-01 MED ORDER — PEGFILGRASTIM INJECTION 6 MG/0.6ML ~~LOC~~
6.0000 mg | PREFILLED_SYRINGE | Freq: Once | SUBCUTANEOUS | Status: AC
  Administered 2017-01-01: 6 mg via SUBCUTANEOUS
  Filled 2017-01-01: qty 0.6

## 2017-01-03 ENCOUNTER — Telehealth: Payer: Self-pay | Admitting: Oncology

## 2017-01-03 NOTE — Telephone Encounter (Signed)
Patient called in wanting to change infusion time

## 2017-01-04 DIAGNOSIS — F3131 Bipolar disorder, current episode depressed, mild: Secondary | ICD-10-CM | POA: Diagnosis not present

## 2017-01-17 ENCOUNTER — Other Ambulatory Visit: Payer: Self-pay | Admitting: Family Medicine

## 2017-01-21 ENCOUNTER — Telehealth: Payer: Self-pay | Admitting: Oncology

## 2017-01-21 ENCOUNTER — Other Ambulatory Visit (HOSPITAL_BASED_OUTPATIENT_CLINIC_OR_DEPARTMENT_OTHER): Payer: PPO

## 2017-01-21 ENCOUNTER — Ambulatory Visit: Payer: PPO

## 2017-01-21 ENCOUNTER — Ambulatory Visit (HOSPITAL_BASED_OUTPATIENT_CLINIC_OR_DEPARTMENT_OTHER): Payer: PPO

## 2017-01-21 ENCOUNTER — Ambulatory Visit (HOSPITAL_BASED_OUTPATIENT_CLINIC_OR_DEPARTMENT_OTHER): Payer: PPO | Admitting: Oncology

## 2017-01-21 VITALS — BP 124/50 | HR 76 | Temp 98.7°F | Resp 18 | Ht 62.0 in | Wt 151.9 lb

## 2017-01-21 DIAGNOSIS — C482 Malignant neoplasm of peritoneum, unspecified: Secondary | ICD-10-CM

## 2017-01-21 DIAGNOSIS — C50411 Malignant neoplasm of upper-outer quadrant of right female breast: Secondary | ICD-10-CM | POA: Diagnosis not present

## 2017-01-21 DIAGNOSIS — Z95828 Presence of other vascular implants and grafts: Secondary | ICD-10-CM

## 2017-01-21 DIAGNOSIS — Z5111 Encounter for antineoplastic chemotherapy: Secondary | ICD-10-CM | POA: Diagnosis not present

## 2017-01-21 DIAGNOSIS — Z170 Estrogen receptor positive status [ER+]: Secondary | ICD-10-CM

## 2017-01-21 DIAGNOSIS — C50919 Malignant neoplasm of unspecified site of unspecified female breast: Secondary | ICD-10-CM

## 2017-01-21 DIAGNOSIS — C50412 Malignant neoplasm of upper-outer quadrant of left female breast: Secondary | ICD-10-CM | POA: Diagnosis not present

## 2017-01-21 LAB — COMPREHENSIVE METABOLIC PANEL
ALBUMIN: 3.3 g/dL — AB (ref 3.5–5.0)
ALK PHOS: 75 U/L (ref 40–150)
ALT: 7 U/L (ref 0–55)
ANION GAP: 9 meq/L (ref 3–11)
AST: 12 U/L (ref 5–34)
BUN: 12.2 mg/dL (ref 7.0–26.0)
CALCIUM: 8.9 mg/dL (ref 8.4–10.4)
CO2: 27 mEq/L (ref 22–29)
Chloride: 106 mEq/L (ref 98–109)
Creatinine: 0.7 mg/dL (ref 0.6–1.1)
Glucose: 101 mg/dl (ref 70–140)
POTASSIUM: 4.1 meq/L (ref 3.5–5.1)
Sodium: 142 mEq/L (ref 136–145)
Total Bilirubin: 0.23 mg/dL (ref 0.20–1.20)
Total Protein: 7.6 g/dL (ref 6.4–8.3)

## 2017-01-21 LAB — CBC WITH DIFFERENTIAL/PLATELET
BASO%: 0.3 % (ref 0.0–2.0)
Basophils Absolute: 0 10*3/uL (ref 0.0–0.1)
EOS%: 0.5 % (ref 0.0–7.0)
Eosinophils Absolute: 0 10*3/uL (ref 0.0–0.5)
HCT: 28.9 % — ABNORMAL LOW (ref 34.8–46.6)
HGB: 9.3 g/dL — ABNORMAL LOW (ref 11.6–15.9)
LYMPH#: 1.3 10*3/uL (ref 0.9–3.3)
LYMPH%: 40.4 % (ref 14.0–49.7)
MCH: 32.3 pg (ref 25.1–34.0)
MCHC: 32.2 g/dL (ref 31.5–36.0)
MCV: 100.5 fL (ref 79.5–101.0)
MONO#: 0.4 10*3/uL (ref 0.1–0.9)
MONO%: 10.9 % (ref 0.0–14.0)
NEUT#: 1.5 10*3/uL (ref 1.5–6.5)
NEUT%: 47.9 % (ref 38.4–76.8)
PLATELETS: 71 10*3/uL — AB (ref 145–400)
RBC: 2.87 10*6/uL — AB (ref 3.70–5.45)
RDW: 15.9 % — ABNORMAL HIGH (ref 11.2–14.5)
WBC: 3.2 10*3/uL — ABNORMAL LOW (ref 3.9–10.3)

## 2017-01-21 MED ORDER — DIPHENHYDRAMINE HCL 50 MG/ML IJ SOLN
INTRAMUSCULAR | Status: AC
  Filled 2017-01-21: qty 1

## 2017-01-21 MED ORDER — SODIUM CHLORIDE 0.9% FLUSH
10.0000 mL | INTRAVENOUS | Status: DC | PRN
  Administered 2017-01-21: 10 mL
  Filled 2017-01-21: qty 10

## 2017-01-21 MED ORDER — SODIUM CHLORIDE 0.9 % IV SOLN
Freq: Once | INTRAVENOUS | Status: AC
  Administered 2017-01-21: 10:00:00 via INTRAVENOUS

## 2017-01-21 MED ORDER — DEXAMETHASONE 4 MG PO TABS
ORAL_TABLET | ORAL | Status: AC
  Filled 2017-01-21: qty 1

## 2017-01-21 MED ORDER — SODIUM CHLORIDE 0.9 % IJ SOLN
10.0000 mL | INTRAMUSCULAR | Status: DC | PRN
  Administered 2017-01-21: 10 mL via INTRAVENOUS
  Filled 2017-01-21: qty 10

## 2017-01-21 MED ORDER — PALONOSETRON HCL INJECTION 0.25 MG/5ML
INTRAVENOUS | Status: AC
  Filled 2017-01-21: qty 5

## 2017-01-21 MED ORDER — DEXAMETHASONE SODIUM PHOSPHATE 10 MG/ML IJ SOLN
4.0000 mg | Freq: Once | INTRAMUSCULAR | Status: AC
  Administered 2017-01-21: 4 mg via INTRAVENOUS

## 2017-01-21 MED ORDER — DEXAMETHASONE SODIUM PHOSPHATE 10 MG/ML IJ SOLN
INTRAMUSCULAR | Status: AC
  Filled 2017-01-21: qty 1

## 2017-01-21 MED ORDER — FAMOTIDINE IN NACL 20-0.9 MG/50ML-% IV SOLN
INTRAVENOUS | Status: AC
  Filled 2017-01-21: qty 50

## 2017-01-21 MED ORDER — CARBOPLATIN CHEMO INJECTION 600 MG/60ML
570.0000 mg | Freq: Once | INTRAVENOUS | Status: AC
  Administered 2017-01-21: 570 mg via INTRAVENOUS
  Filled 2017-01-21: qty 57

## 2017-01-21 MED ORDER — DIPHENHYDRAMINE HCL 50 MG/ML IJ SOLN
50.0000 mg | Freq: Once | INTRAMUSCULAR | Status: AC
  Administered 2017-01-21: 50 mg via INTRAVENOUS

## 2017-01-21 MED ORDER — PALONOSETRON HCL INJECTION 0.25 MG/5ML
0.2500 mg | Freq: Once | INTRAVENOUS | Status: AC
  Administered 2017-01-21: 0.25 mg via INTRAVENOUS

## 2017-01-21 MED ORDER — HEPARIN SOD (PORK) LOCK FLUSH 100 UNIT/ML IV SOLN
500.0000 [IU] | Freq: Once | INTRAVENOUS | Status: AC | PRN
  Administered 2017-01-21: 500 [IU]
  Filled 2017-01-21: qty 5

## 2017-01-21 MED ORDER — FAMOTIDINE IN NACL 20-0.9 MG/50ML-% IV SOLN
20.0000 mg | Freq: Two times a day (BID) | INTRAVENOUS | Status: DC
  Administered 2017-01-21: 20 mg via INTRAVENOUS

## 2017-01-21 NOTE — Progress Notes (Signed)
Hematology and Oncology Follow Up Visit  [name] [id number] 10-02-1955 61 y.o. 01/21/2017 9:27 AM    Principle Diagnosis: 61 year old woman diagnosed with:  1. 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 This was diagnosed in July of 2014. 2. 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 The tumor is ER positive PR negative, HER-2 negative.   Prior Therapy:  She is status post paracentesis performed on 09/17/2012 with the cytology confirmed the presence of adenocarcinoma and immunohistochemical stains suggest GYN etiology. She is also status post lumpectomy for breast cancer diagnosed 25 years ago followed by radiation and chemotherapy under the care of Dr. Sonny Padilla. She did not receive any hormonal therapy. Chemotherapy utilizing carboplatin and Taxotere started on 10/01/2012. Avastin was added with cycle 4. This was discontinued in June of 2015. She is S/P Avastin maintenance only between June 2015 till March 2017. Therapy discontinued due to progression of disease. She is status post Doxil salvage chemotherapy therapy discontinued because of hypersensitivity reaction in March 2017.  Current therapy:  Carboplatin and AUC of 5 every 3 weeks cycle 1 started on 07/08/2015. She is here for her next cycle of therapy.  Interim History: [name] presents today for a followup visit. Since her last visit, she reports feeling fatigued which is unchanged from previous evaluations. She continues to receive chemotherapy without any new complications. She denied any infusion-related complications to chemotherapy. She denied any vomiting or bleeding. She does have mild nausea that is managed by Zofran. She does not report any abdominal pain or early satiety. She denied any petechiae or bleeding.  She denies any recent hospitalizations.  She did report the lower pelvic  discomfort although this has improved at this time.  She does not report any headaches, blurry vision, syncope or seizures. She denied any fever chills. She does report some chest discomfort but no dyspnea on exertion. Has not reported any shortness of breath or cough. She denied any pain with swallowing. She does not report any vomiting, constipation does report some occasional diarrhea. She denied any GYN bleeding. She reports no anxiety or depression. Remainder of the review of systems is unremarkable.  Medications: I have reviewed the patient's current medications.  Current Outpatient Medications  Medication Sig Dispense Refill  . acetaminophen (TYLENOL) 325 MG tablet Take 2 tablets (650 mg total) by mouth every 4 (four) hours as needed for mild pain, fever or headache. 30 tablet 0  . Acetylcarnitine HCl (ACETYL L-CARNITINE PO) Take 2,000 mg by mouth at bedtime.    . ALPRAZolam (XANAX) 0.5 MG tablet Take 0.5 mg by mouth 2 (two) times daily as needed for anxiety.    Marland Kitchen aspirin 81 MG chewable tablet Chew 1 tablet (81 mg total) by mouth daily. 30 tablet 0  . atorvastatin (LIPITOR) 40 MG tablet Take 1 tablet (40 mg total) by mouth daily at 6 PM. 90 tablet 3  . carvedilol (COREG) 3.125 MG tablet Take 1/2 tablet by mouth twice daily with meals. 30 tablet 9  . chlorhexidine (PERIDEX) 0.12 % solution RINSE W/1 CAPFUL FOR 30 SEC & EXPECTORATE. REPEAT TWICE DAILY FOR 7-10 DAYS  0  . clotrimazole-betamethasone (LOTRISONE) cream Apply 1 application topically 2 (two) times daily. 30 g 1  . divalproex (DEPAKOTE) 250 MG DR tablet Take by mouth. Take 571ms in the morning and 7524m at night    . fluticasone (FLONASE)  50 MCG/ACT nasal spray INHALE 2 SPRAYS INTO EACH NOSTRIL EVERY NIGHT  11  . gabapentin (NEURONTIN) 100 MG capsule Take 1 capsule by mouth twice daily. 180 capsule 3  . lamoTRIgine (LAMICTAL) 100 MG tablet Take 100 mg by mouth 2 (two) times daily.    Marland Kitchen levothyroxine (SYNTHROID, LEVOTHROID) 100 MCG  tablet Take 1 tablet (100 mcg total) by mouth daily before breakfast. 90 tablet 3  . lidocaine-prilocaine (EMLA) cream Apply topically as needed. Apply to port with every chemotherapy. 30 g 0  . loperamide (IMODIUM A-D) 2 MG tablet Take 2 mg by mouth as needed for diarrhea or loose stools.    Marland Kitchen loratadine (CLARITIN) 10 MG tablet Take 10 mg by mouth daily as needed for allergies (with chemo).    . nitroGLYCERIN (NITROSTAT) 0.4 MG SL tablet Place 1 tablet (0.4 mg total) under the tongue every 5 (five) minutes as needed for chest pain. 25 tablet 3  . ondansetron (ZOFRAN ODT) 8 MG disintegrating tablet Take 1 tablet (8 mg total) by mouth every 8 (eight) hours as needed for nausea or vomiting. 20 tablet 1  . ondansetron (ZOFRAN) 8 MG tablet TAKE 1 TABLET BY MOUTH EVERY 8 HOURS AS NEEDED FOR NAUSEA AND VOMITING 30 tablet 2  . pantoprazole (PROTONIX) 40 MG tablet Take 1 tablet (40 mg total) by mouth daily. 90 tablet 1  . prochlorperazine (COMPAZINE) 10 MG tablet Take 1 tablet (10 mg total) by mouth every 6 (six) hours as needed for nausea or vomiting. 30 tablet 0  . rOPINIRole (REQUIP) 0.5 MG tablet Take 3 tablets (1.5 mg total) by mouth at bedtime. 135 tablet 0  . sertraline (ZOLOFT) 50 MG tablet      No current facility-administered medications for this visit.    Facility-Administered Medications Ordered in Other Visits  Medication Dose Route Frequency Provider Last Rate Last Dose  . heparin lock flush 100 unit/mL  500 Units Intravenous Once Wyatt Portela, MD      . sodium chloride flush (NS) 0.9 % injection 10 mL  10 mL Intravenous PRN Wyatt Portela, MD         Allergies:  Allergies  Allergen Reactions  . Doxil [Doxorubicin Hcl Liposomal] Anaphylaxis    1st Doxil.   . Pollen Extract Other (See Comments)    Pollen and grass causes a lot sneezing    Past Medical History, Surgical history, Social history, and Family History were reviewed and updated.   Physical Exam: Blood pressure (!)  124/50, pulse 76, temperature 98.7 F (37.1 C), temperature source Oral, resp. rate 18, height _0  (1.575 m), weight 151 lb 14.4 oz (68.9 kg), SpO2 100 %.  ECOG: 1 General appearance: Alert, awake woman without distress. Head: Normocephalic, without obvious abnormality. No oral thrush or ulcers. Neck: no adenopathy or masses. Lymph nodes: Cervical, supraclavicular, and axillary nodes normal. Heart:regular rate and rhythm, S1, S2 normal, no murmur, click, rub or gallop Lung:chest clear, no wheezing, rales, normal symmetric. Chest wall examination:  Port-A-Cath site without any abnormalities or erythema. Abdomin: Soft, nontender with good bowel sounds. No shifting dullness or ascites. EXT:no erythema, induration, or nodules. No edema. Skin: No rashes or lesions. No ecchymosis.  Lab Results: Lab Results  Component Value Date   WBC 3.2 (L) 01/21/2017   HGB 9.3 (L) 01/21/2017   HCT 28.9 (L) 01/21/2017   MCV 100.5 01/21/2017   PLT 71 (L) 01/21/2017     Chemistry      Component Value Date/Time  NA 142 01/21/2017 0822   K 4.1 01/21/2017 0822   CL 104 04/16/2016 0806   CO2 27 01/21/2017 0822   BUN 12.2 01/21/2017 0822   CREATININE 0.7 01/21/2017 0822      Component Value Date/Time   CALCIUM 8.9 01/21/2017 0822   ALKPHOS 75 01/21/2017 0822   AST 12 01/21/2017 0822   ALT 7 01/21/2017 0822   BILITOT 0.23 01/21/2017 0822        Impression and Plan:  61 year old woman with the following issues:  1. Peritoneal carcinomatosis with omental involvement. The pathology confirmed the presence of adenocarcinoma of likely GYN etiology. Her CA 125 was elevated at 333.5 on 09/19/2012. She is status post systemic chemotherapy with an excellent response utilizing carboplatin and Taxotere and a Avastin . This therapy was held in June of 2015 and currently on Avastin maintenance.   CT scan obtained on 05/05/2015 as well as her tumor markers showed clear progression. Her CA-125 is up to 189  and her CT scan showed increase in her peritoneal disease.   She is currently receiving salvage therapy with carboplatin single agent at AUC of 5 with a reasonable response to therapy.  CT scan obtained on 12/28/2016 showed stable disease for the most part. Her CA-125 continues to be stable and slightly declining.  The plan is to continue with the same dose and schedule of this treatment after a short treatment break over the holidays.  She will receive her next treatment in January 2019.  2. IV access: Port-A-Cath is without complications.  No issues reported.  3. Thrombocytopenia: Her platelets unchanged at this time.  4. Abdominal pain: Very minor pelvic discomfort.  Imaging studies last month did not show any acute pathology.  5. Left breast mass: She status post biopsy with the pathology indicating T2 N1 disease with the tumor have ER positive, PR negative HER-2 negative. Her Ki-67 is 50%.  Mammogram obtained on 03/20/2016 showed excellent response to systemic chemotherapy. We'll continue to follow with periodic mammography.  8. Neutropenia: Neulasta will be given after a cycles of therapy.   9. Nausea: Manageable at this time.  10. Followup: She will follow-up in 3 weeks for chemotherapy infusion.     Zola Button, MD 12/3/20189:27 AM

## 2017-01-21 NOTE — Progress Notes (Signed)
Pe Dr Alen Blew, Bonnie for treatment today with PLT of 71

## 2017-01-21 NOTE — Patient Instructions (Signed)
[facility] Discharge Instructions for Patients Receiving Chemotherapy  Today you received the following chemotherapy agents:  Carboplatin (paraplatin)  To help prevent nausea and vomiting after your treatment, we encourage you to take your nausea medication as prescribed.   If you develop nausea and vomiting that is not controlled by your nausea medication, call the clinic.   BELOW ARE SYMPTOMS THAT SHOULD BE REPORTED IMMEDIATELY:  *FEVER GREATER THAN 100.5 F  *CHILLS WITH OR WITHOUT FEVER  NAUSEA AND VOMITING THAT IS NOT CONTROLLED WITH YOUR NAUSEA MEDICATION  *UNUSUAL SHORTNESS OF BREATH  *UNUSUAL BRUISING OR BLEEDING  TENDERNESS IN MOUTH AND THROAT WITH OR WITHOUT PRESENCE OF ULCERS  *URINARY PROBLEMS  *BOWEL PROBLEMS  UNUSUAL RASH Items with * indicate a potential emergency and should be followed up as soon as possible.  Feel free to call the clinic should you have any questions or concerns. The clinic phone number is [phone].  Please show the [facility] at check-in to the Emergency Department and triage nurse.

## 2017-01-21 NOTE — Patient Instructions (Signed)
Implanted Port Home Guide An implanted port is a type of central line that is placed under the skin. Central lines are used to provide IV access when treatment or nutrition needs to be given through a person's veins. Implanted ports are used for long-term IV access. An implanted port may be placed because:  You need IV medicine that would be irritating to the small veins in your hands or arms.  You need long-term IV medicines, such as antibiotics.  You need IV nutrition for a long period.  You need frequent blood draws for lab tests.  You need dialysis.  Implanted ports are usually placed in the chest area, but they can also be placed in the upper arm, the abdomen, or the leg. An implanted port has two main parts:  Reservoir. The reservoir is round and will appear as a small, raised area under your skin. The reservoir is the part where a needle is inserted to give medicines or draw blood.  Catheter. The catheter is a thin, flexible tube that extends from the reservoir. The catheter is placed into a large vein. Medicine that is inserted into the reservoir goes into the catheter and then into the vein.  How will I care for my incision site? Do not get the incision site wet. Bathe or shower as directed by your health care provider. How is my port accessed? Special steps must be taken to access the port:  Before the port is accessed, a numbing cream can be placed on the skin. This helps numb the skin over the port site.  Your health care provider uses a sterile technique to access the port. ? Your health care provider must put on a mask and sterile gloves. ? The skin over your port is cleaned carefully with an antiseptic and allowed to dry. ? The port is gently pinched between sterile gloves, and a needle is inserted into the port.  Only "non-coring" port needles should be used to access the port. Once the port is accessed, a blood return should be checked. This helps ensure that the port  is in the vein and is not clogged.  If your port needs to remain accessed for a constant infusion, a clear (transparent) bandage will be placed over the needle site. The bandage and needle will need to be changed every week, or as directed by your health care provider.  Keep the bandage covering the needle clean and dry. Do not get it wet. Follow your health care provider's instructions on how to take a shower or bath while the port is accessed.  If your port does not need to stay accessed, no bandage is needed over the port.  What is flushing? Flushing helps keep the port from getting clogged. Follow your health care provider's instructions on how and when to flush the port. Ports are usually flushed with saline solution or a medicine called heparin. The need for flushing will depend on how the port is used.  If the port is used for intermittent medicines or blood draws, the port will need to be flushed: ? After medicines have been given. ? After blood has been drawn. ? As part of routine maintenance.  If a constant infusion is running, the port may not need to be flushed.  How long will my port stay implanted? The port can stay in for as long as your health care provider thinks it is needed. When it is time for the port to come out, surgery will be   done to remove it. The procedure is similar to the one performed when the port was put in. When should I seek immediate medical care? When you have an implanted port, you should seek immediate medical care if:  You notice a bad smell coming from the incision site.  You have swelling, redness, or drainage at the incision site.  You have more swelling or pain at the port site or the surrounding area.  You have a fever that is not controlled with medicine.  This information is not intended to replace advice given to you by your health care provider. Make sure you discuss any questions you have with your health care provider. Document  Released: 02/05/2005 Document Revised: 07/14/2015 Document Reviewed: 10/13/2012 Elsevier Interactive Patient Education  2017 [employer]  

## 2017-01-21 NOTE — Telephone Encounter (Signed)
Scheduled appt per 12/3 los - Pt did not want print - My chart active - patient aware of appts added.

## 2017-01-22 ENCOUNTER — Ambulatory Visit (HOSPITAL_BASED_OUTPATIENT_CLINIC_OR_DEPARTMENT_OTHER): Payer: PPO

## 2017-01-22 ENCOUNTER — Ambulatory Visit: Payer: PPO

## 2017-01-22 VITALS — BP 109/60 | HR 75 | Temp 98.2°F | Resp 18

## 2017-01-22 DIAGNOSIS — Z5189 Encounter for other specified aftercare: Secondary | ICD-10-CM | POA: Diagnosis not present

## 2017-01-22 DIAGNOSIS — C482 Malignant neoplasm of peritoneum, unspecified: Secondary | ICD-10-CM | POA: Diagnosis not present

## 2017-01-22 LAB — CA 125: CANCER ANTIGEN (CA) 125: 31.5 U/mL (ref 0.0–38.1)

## 2017-01-22 MED ORDER — PEGFILGRASTIM INJECTION 6 MG/0.6ML ~~LOC~~
6.0000 mg | PREFILLED_SYRINGE | Freq: Once | SUBCUTANEOUS | Status: AC
  Administered 2017-01-22: 6 mg via SUBCUTANEOUS
  Filled 2017-01-22: qty 0.6

## 2017-01-22 NOTE — Patient Instructions (Signed)
Pegfilgrastim injection What is this medicine? PEGFILGRASTIM (PEG fil gra stim) is a long-acting granulocyte colony-stimulating factor that stimulates the growth of neutrophils, a type of white blood cell important in the body's fight against infection. It is used to reduce the incidence of fever and infection in patients with certain types of cancer who are receiving chemotherapy that affects the bone marrow, and to increase survival after being exposed to high doses of radiation. This medicine may be used for other purposes; ask your health care provider or pharmacist if you have questions. What should I tell my health care provider before I take this medicine? They need to know if you have any of these conditions: -kidney disease -latex allergy -ongoing radiation therapy -sickle cell disease -skin reactions to acrylic adhesives (On-Body Injector only) -an unusual or allergic reaction to pegfilgrastim, filgrastim, other medicines, foods, dyes, or preservatives -pregnant or trying to get pregnant -breast-feeding How should I use this medicine? This medicine is for injection under the skin. If you get this medicine at home, you will be taught how to prepare and give the pre-filled syringe or how to use the On-body Injector. Refer to the patient Instructions for Use for detailed instructions. Use exactly as directed. Take your medicine at regular intervals. Do not take your medicine more often than directed. It is important that you put your used needles and syringes in a special sharps container. Do not put them in a trash can. If you do not have a sharps container, call your pharmacist or healthcare provider to get one. Talk to your pediatrician regarding the use of this medicine in children. While this drug may be prescribed for selected conditions, precautions do apply. Overdosage: If you think you have taken too much of this medicine contact a poison control center or emergency room at  once. NOTE: This medicine is only for you. Do not share this medicine with others. What if I miss a dose? It is important not to miss your dose. Call your doctor or health care professional if you miss your dose. If you miss a dose due to an On-body Injector failure or leakage, a new dose should be administered as soon as possible using a single prefilled syringe for manual use. What may interact with this medicine? Interactions have not been studied. Give your health care provider a list of all the medicines, herbs, non-prescription drugs, or dietary supplements you use. Also tell them if you smoke, drink alcohol, or use illegal drugs. Some items may interact with your medicine. This list may not describe all possible interactions. Give your health care provider a list of all the medicines, herbs, non-prescription drugs, or dietary supplements you use. Also tell them if you smoke, drink alcohol, or use illegal drugs. Some items may interact with your medicine. What should I watch for while using this medicine? You may need blood work done while you are taking this medicine. If you are going to need a MRI, CT scan, or other procedure, tell your doctor that you are using this medicine (On-Body Injector only). What side effects may I notice from receiving this medicine? Side effects that you should report to your doctor or health care professional as soon as possible: -allergic reactions like skin rash, itching or hives, swelling of the face, lips, or tongue -dizziness -fever -pain, redness, or irritation at site where injected -pinpoint red spots on the skin -red or dark-brown urine -shortness of breath or breathing problems -stomach or side pain, or pain   at the shoulder -swelling -tiredness -trouble passing urine or change in the amount of urine Side effects that usually do not require medical attention (report to your doctor or health care professional if they continue or are  bothersome): -bone pain -muscle pain This list may not describe all possible side effects. Call your doctor for medical advice about side effects. You may report side effects to FDA at 1-800-FDA-1088. Where should I keep my medicine? Keep out of the reach of children. Store pre-filled syringes in a refrigerator between 2 and 8 degrees C (36 and 46 degrees F). Do not freeze. Keep in carton to protect from light. Throw away this medicine if it is left out of the refrigerator for more than 48 hours. Throw away any unused medicine after the expiration date. NOTE: This sheet is a summary. It may not cover all possible information. If you have questions about this medicine, talk to your doctor, pharmacist, or health care provider.    2016, Elsevier/Gold Standard. (2014-02-25 14:30:14)  

## 2017-02-05 ENCOUNTER — Ambulatory Visit (INDEPENDENT_AMBULATORY_CARE_PROVIDER_SITE_OTHER): Payer: PPO | Admitting: Pulmonary Disease

## 2017-02-05 ENCOUNTER — Encounter: Payer: Self-pay | Admitting: Pulmonary Disease

## 2017-02-05 VITALS — BP 118/78 | HR 70 | Ht 62.0 in | Wt 152.0 lb

## 2017-02-05 DIAGNOSIS — G2581 Restless legs syndrome: Secondary | ICD-10-CM

## 2017-02-05 DIAGNOSIS — G4733 Obstructive sleep apnea (adult) (pediatric): Secondary | ICD-10-CM

## 2017-02-05 NOTE — Patient Instructions (Signed)
Home sleep test 

## 2017-02-05 NOTE — Progress Notes (Signed)
Subjective:    Patient ID: [name], female    DOB: 06-04-1955, 61 y.o.   MRN: [id number]  HPI  61 year old woman referred for evaluation of obstructive sleep apnea. She was diagnosed with mild OSA back in 2000 and new CPAP for many years.  She underwent reevaluation in 2015, when she was undergoing chemotherapy and it was felt that fatigue may be related to chemotherapy treatments and hence treatment was deferred. She continues to feel excessive tiredness and fatigue in the daytime and wonders if sleep apnea has worsened. Her weight has remained more or less constant, infection may have dropped 10 pounds to her current weight of 152 pounds compared to 2015.  She has bipolar disorder with ongoing depression and is maintained on Lamictal and Xanax and Depakote.  She also takes Requip for restless legs at bedtime.  Epworth sleepiness score is 17 Bedtime is between 11 PM and 2 AM, she has always been a night owl and now that she is disabled and not working can resort to her usual bedtime hours.  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   Invasive ductal carcinoma , grade 3 of the left breast with positive sentinel lymph node biopsy diagnosed on 07/07/2015 s/p chemotherapy with good response on FU mammogram  Significant tests/ events reviewed  2000 >> mild OSA,  AHI of 14    Past Medical History:  Diagnosis Date  . ADD (attention deficit disorder)   . Allergy    seasonal  . Anxiety   . Bipolar disorder ([place])   . Breast cancer ([place]) 1989   right breast  . Cancer  ([place])    Omentum  . Coronary artery disease   . Depression   . Hematochezia   . History of echocardiogram    a. Limited Echo 9/17: EF 60-65%, no RWMA, Gr 1 DD, trivial AI, trivial MR, GLS -12% (likely underestimated), no pericardial effusion  . Hyperlipidemia   . Hypothyroidism   . Maintenance chemotherapy    Pt has chemo every 3 weeks (on Friday)  . NSTEMI (non-ST elevated myocardial infarction) ([place]) 10/26/2015  . Osteopenia 03/2009   t score -2.1 FRAX 4.6/0.4  . Premature menopause   . Sleep apnea    mild    Past Surgical History:  Procedure Laterality Date  . BREAST SURGERY  1989   RIGHT LUMPECTOMY, RADIATION AND CHEMO  . CARDIAC CATHETERIZATION N/A 10/26/2015   Procedure: Left Heart Cath and Coronary Angiography;  Surgeon: Wellington Hampshire, MD;  Location: [facility];  Service: Cardiovascular;  Laterality: N/A;  . CARDIAC CATHETERIZATION N/A 10/26/2015   Procedure: Coronary Stent Intervention;  Surgeon: Wellington Hampshire, MD;  Location: [facility];  Service: Cardiovascular;  Laterality: N/A;  . CARDIAC CATHETERIZATION N/A 11/08/2015   Procedure: Left Heart Cath and Coronary Angiography;  Surgeon: Jolaine Artist, MD;  Location: [facility];  Service: Cardiovascular;  Laterality: N/A;  . drug eluting stent  10/26/2015  . FOOT SURGERY  2013   BILATERAL   . HYSTEROSCOPY  2011  [place]  . PELVIC LAPAROSCOPY/ Hysteroscopy  1996    Allergies  Allergen Reactions  . Doxil [Doxorubicin Hcl Liposomal] Anaphylaxis    1st Doxil.   . Pollen Extract Other (See Comments)    Pollen and grass causes a lot sneezing      Social History   Socioeconomic History  . Marital status: Married    Spouse name: [name]  . Number of children: 0  . Years of education: 94  . Highest education level: Not on file  Social Needs  . Financial resource strain: Not on file  . Food insecurity - worry: Not on file  . Food insecurity - inability: Not on file  .  Transportation needs - medical: Not on file  . Transportation needs - non-medical: Not on file  Occupational History  . Occupation: unemployed    Fish farm manager: [employer], serv  Tobacco Use  . Smoking status: Never Smoker  . Smokeless tobacco: Never Used  Substance and Sexual Activity  . Alcohol use: Yes    Alcohol/week: 0.0 oz    Comment: very rarely - maybe 1 glass wine every 3 mos  . Drug use: No  . Sexual activity: Not Currently    Birth control/protection: Post-menopausal    Comment: 1st intercourse 29 yo-5 partners  Other Topics Concern  . Not on file  Social History Narrative   Lives at home with husband.    Caffeine use:  Tea/soda occass     Family History  Problem Relation Age of Onset  . Breast cancer Maternal Aunt 29  . Diabetes Maternal Grandmother   . Heart disease Maternal Grandmother   . Heart disease Maternal Grandfather   . Hypertension Paternal Grandfather   . Heart Problems Paternal Grandfather   . Breast cancer Maternal Aunt        dx. early 58s; had negative GT approx 10 years ago  . Breast cancer Maternal Aunt        dx. 25s with recurrence  . Allergies Father   . Heart disease Mother   . Other Mother        hx of hysterectomy   . Liver cancer Cousin 64       +EtOH  . Cancer Cousin        paternal 1st cousin, once-removed dx. NOS cancer (maybe ovarian) in late 30s-40s  . Cancer Cousin        female paternal 2nd cousin d. of NOS cancer in her 20s-early 31s  . Ovarian cancer Neg Hx   . Colon cancer Neg Hx     Review of Systems Positive for shortness of breath with activity, indigestion, loss of appetite, anxiety, depression joint stiffness  Constitutional: negative for anorexia, fevers and sweats  Eyes: negative for irritation, redness and visual disturbance  Ears, nose, mouth, throat, and face: negative for earaches, epistaxis, nasal congestion and sore throat  Respiratory: negative for cough, dyspnea on exertion, sputum and wheezing    Cardiovascular: negative for chest pain, dyspnea, lower extremity edema, orthopnea, palpitations and syncope  Gastrointestinal: negative for abdominal pain, constipation, diarrhea, melena, nausea and vomiting  Genitourinary:negative for dysuria, frequency and hematuria  Hematologic/lymphatic: negative for bleeding, easy bruising  Musculoskeletal:negative for arthralgias, muscle weakness and stiff joints  Neurological: negative for coordination problems, gait problems, headaches and weakness  Endocrine: negative for diabetic symptoms including polydipsia, polyuria and weight loss      Objective:   Physical Exam  Gen. Pleasant, obese, in no distress ENT - no lesions,  no post nasal drip Neck: No JVD, no thyromegaly, no carotid bruits Lungs: no use of accessory muscles, no dullness to percussion, decreased without rales or rhonchi  Cardiovascular: Rhythm regular, heart sounds  normal, no murmurs or gallops, no peripheral edema Musculoskeletal: No deformities, no cyanosis or clubbing , no tremors       Assessment & Plan:

## 2017-02-05 NOTE — Assessment & Plan Note (Signed)
Given excessive daytime somnolence, narrow pharyngeal exam, witnessed apneas & loud snoring, obstructive sleep apnea is very likely & an overnight polysomnogram will be scheduled as a home study. The pathophysiology of obstructive sleep apnea , it's cardiovascular consequences & modes of treatment including CPAP were discused with the patient in detail & they evidenced understanding.  Differential diagnosis here would be medication induced hypersomnolence or cancer/ chemotherapy-induced fatigue

## 2017-02-05 NOTE — Assessment & Plan Note (Signed)
Seems to be controlled with current dose of Requip. Could consider decreasing.  Also check iron studies

## 2017-02-06 ENCOUNTER — Ambulatory Visit (INDEPENDENT_AMBULATORY_CARE_PROVIDER_SITE_OTHER): Payer: PPO | Admitting: Gynecology

## 2017-02-06 ENCOUNTER — Encounter: Payer: Self-pay | Admitting: Gynecology

## 2017-02-06 VITALS — BP 118/76 | Ht 61.0 in | Wt 154.0 lb

## 2017-02-06 DIAGNOSIS — N952 Postmenopausal atrophic vaginitis: Secondary | ICD-10-CM | POA: Diagnosis not present

## 2017-02-06 DIAGNOSIS — R32 Unspecified urinary incontinence: Secondary | ICD-10-CM | POA: Diagnosis not present

## 2017-02-06 DIAGNOSIS — Z01411 Encounter for gynecological examination (general) (routine) with abnormal findings: Secondary | ICD-10-CM

## 2017-02-06 DIAGNOSIS — M8589 Other specified disorders of bone density and structure, multiple sites: Secondary | ICD-10-CM | POA: Diagnosis not present

## 2017-02-06 DIAGNOSIS — M8580 Other specified disorders of bone density and structure, unspecified site: Secondary | ICD-10-CM

## 2017-02-06 NOTE — Progress Notes (Signed)
    [name] [facility] 07/04/1955 [id number]        61 y.o.  G0P0 for breast and pelvic exam  Past medical history,surgical history, problem list, medications, allergies, family history and social history were all reviewed and documented as reviewed in the EPIC chart.  ROS:  Performed with pertinent positives and negatives included in the history, assessment and plan.   Additional significant findings : None   Exam: Caryn Bee assistant Vitals:   02/06/17 1613  BP: 118/76  Weight: 154 lb (69.9 kg)  Height: 5\' 1"  (1.549 m)   Body mass index is 29.1 kg/m.  General appearance:  Normal affect, orientation and appearance. Skin: Grossly normal HEENT: Without gross lesions.  No cervical or supraclavicular adenopathy. Thyroid normal.  Lungs:  Clear without wheezing, rales or rhonchi Cardiac: RR, without RMG Abdominal:  Soft, nontender, without masses, guarding, rebound, organomegaly or hernia Breasts:  Examined lying and sitting.  Left without masses retractions discharge adenopathy.  Right status post lumpectomy with radiation changes.  No masses or axillary adenopathy. Pelvic:  Ext, BUS, Vagina: With atrophic changes  Cervix: With atrophic changes  Uterus: Axial, normal size, shape and contour, midline and mobile nontender   Adnexa: Without masses or tenderness    Anus and perineum: Normal   Rectovaginal: Normal sphincter tone without palpated masses or tenderness.    Assessment/Plan:  61 y.o. G0P0 female for breast and pelvic exam.   1. Postmenopausal/atrophic genital changes.  No significant hot flushes, night sweats, vaginal dryness or vaginal bleeding. 2. Pap smear 2017.  No Pap smear done today.  No history of abnormal Pap smears.  Plan Pap smear at 3-year intervals per current screening guidelines 3. Primary peritoneal carcinoma 2014.  Currently on salvage chemotherapy actively being taken care of by oncology. 4. History of breast cancer right diagnosed in 1989.  Left  diagnosed 2017.  Actively being followed by oncology.  Mammography 02/2016.  Breast exam NED. 5. Colonoscopy 2016.  Repeat at their recommended interval. 6. Osteopenia.  DEXA 2011 with T score -2.1 FRAX 4% / 0.4%.  I reviewed again with her the issues of repeating her DEXA now and the issues of treatment if worsening.  Patient at this point prefers to discuss this with her oncologist and will follow up with them in reference to their recommendations for bone screening. 7. Urinary incontinence.  Mixed pattern of stress symptoms of loss of urine with coughing sneezing laughing as well as urgency symptoms with feeling like she has to go and cannot make it.  No UTI symptoms such as dysuria low back pain fever or chills.  Check urinalysis today.  Options for management reviewed to include Kegel exercises, surgery, medications for OAB.  Risks/benefits as well as side effects reviewed of the options.  Patient is not interested in doing anything other than some behavior modification as far as more frequent bladder emptying and avoiding bladder stimulants such as caffeine, spicy foods, alcohol. 8. Health maintenance.  No routine lab work done as patient does this elsewhere.  Follow-up in 1 year, sooner as needed.  Additional time in excess of her breast and pelvic exam was spent in direct face to face counseling and coordination of care in regards to her urinary incontinence.    Anastasio Auerbach MD, 4:39 PM 02/06/2017

## 2017-02-06 NOTE — Patient Instructions (Signed)
Follow-up in 1 year for annual exam, sooner as needed. 

## 2017-02-07 DIAGNOSIS — C50911 Malignant neoplasm of unspecified site of right female breast: Secondary | ICD-10-CM | POA: Diagnosis not present

## 2017-02-07 NOTE — Addendum Note (Signed)
Addended by: Joaquin Music on: 02/07/2017 10:07 AM   Modules accepted: Orders

## 2017-02-25 ENCOUNTER — Ambulatory Visit: Payer: PPO

## 2017-02-25 ENCOUNTER — Ambulatory Visit: Payer: PPO | Admitting: Oncology

## 2017-02-25 ENCOUNTER — Other Ambulatory Visit: Payer: PPO

## 2017-02-26 ENCOUNTER — Other Ambulatory Visit: Payer: Self-pay | Admitting: *Deleted

## 2017-02-26 ENCOUNTER — Other Ambulatory Visit: Payer: Self-pay | Admitting: Oncology

## 2017-02-26 ENCOUNTER — Ambulatory Visit: Payer: PPO

## 2017-02-26 DIAGNOSIS — C50919 Malignant neoplasm of unspecified site of unspecified female breast: Secondary | ICD-10-CM

## 2017-02-26 DIAGNOSIS — C482 Malignant neoplasm of peritoneum, unspecified: Secondary | ICD-10-CM

## 2017-02-26 DIAGNOSIS — C50412 Malignant neoplasm of upper-outer quadrant of left female breast: Secondary | ICD-10-CM

## 2017-02-26 DIAGNOSIS — Z170 Estrogen receptor positive status [ER+]: Principal | ICD-10-CM

## 2017-02-27 ENCOUNTER — Other Ambulatory Visit: Payer: Self-pay

## 2017-02-27 ENCOUNTER — Encounter: Payer: Self-pay | Admitting: Oncology

## 2017-02-27 ENCOUNTER — Inpatient Hospital Stay (HOSPITAL_BASED_OUTPATIENT_CLINIC_OR_DEPARTMENT_OTHER): Payer: PPO | Admitting: Oncology

## 2017-02-27 ENCOUNTER — Inpatient Hospital Stay: Payer: PPO

## 2017-02-27 ENCOUNTER — Inpatient Hospital Stay: Payer: PPO | Attending: Oncology

## 2017-02-27 VITALS — BP 117/67 | HR 58 | Temp 98.1°F | Resp 18 | Ht 61.0 in | Wt 152.9 lb

## 2017-02-27 DIAGNOSIS — Z923 Personal history of irradiation: Secondary | ICD-10-CM | POA: Diagnosis not present

## 2017-02-27 DIAGNOSIS — Z5111 Encounter for antineoplastic chemotherapy: Secondary | ICD-10-CM | POA: Insufficient documentation

## 2017-02-27 DIAGNOSIS — Z9221 Personal history of antineoplastic chemotherapy: Secondary | ICD-10-CM

## 2017-02-27 DIAGNOSIS — Z853 Personal history of malignant neoplasm of breast: Secondary | ICD-10-CM

## 2017-02-27 DIAGNOSIS — D696 Thrombocytopenia, unspecified: Secondary | ICD-10-CM | POA: Insufficient documentation

## 2017-02-27 DIAGNOSIS — Z5189 Encounter for other specified aftercare: Secondary | ICD-10-CM | POA: Diagnosis not present

## 2017-02-27 DIAGNOSIS — C482 Malignant neoplasm of peritoneum, unspecified: Secondary | ICD-10-CM

## 2017-02-27 DIAGNOSIS — Z170 Estrogen receptor positive status [ER+]: Principal | ICD-10-CM

## 2017-02-27 DIAGNOSIS — C50919 Malignant neoplasm of unspecified site of unspecified female breast: Secondary | ICD-10-CM

## 2017-02-27 DIAGNOSIS — C50412 Malignant neoplasm of upper-outer quadrant of left female breast: Secondary | ICD-10-CM

## 2017-02-27 LAB — COMPREHENSIVE METABOLIC PANEL
ALT: 7 U/L (ref 0–55)
AST: 13 U/L (ref 5–34)
Albumin: 3.2 g/dL — ABNORMAL LOW (ref 3.5–5.0)
Alkaline Phosphatase: 76 U/L (ref 40–150)
Anion gap: 7 (ref 3–11)
BUN: 9 mg/dL (ref 7–26)
CHLORIDE: 107 mmol/L (ref 98–109)
CO2: 27 mmol/L (ref 22–29)
CREATININE: 0.7 mg/dL (ref 0.60–1.10)
Calcium: 8.6 mg/dL (ref 8.4–10.4)
GFR calc Af Amer: >60 mL/min (ref >60)
Glucose, Bld: 102 mg/dL (ref 70–140)
POTASSIUM: 4.2 mmol/L (ref 3.3–4.7)
SODIUM: 141 mmol/L (ref 136–145)
Total Bilirubin: 0.3 mg/dL (ref 0.2–1.2)
Total Protein: 7.3 g/dL (ref 6.4–8.3)

## 2017-02-27 LAB — CBC WITH DIFFERENTIAL/PLATELET
Abs Granulocyte: 1.5 10*3/uL (ref 1.5–6.5)
BASOS ABS: 0 10*3/uL (ref 0.0–0.1)
BASOS PCT: 0 %
EOS ABS: 0 10*3/uL (ref 0.0–0.5)
EOS PCT: 1 %
HCT: 32 % — ABNORMAL LOW (ref 34.8–46.6)
Hemoglobin: 10 g/dL — ABNORMAL LOW (ref 11.6–15.9)
Lymphocytes Relative: 34 %
Lymphs Abs: 1 10*3/uL (ref 0.9–3.3)
MCH: 30.5 pg (ref 25.1–34.0)
MCHC: 31.2 g/dL — ABNORMAL LOW (ref 31.5–36.0)
MCV: 97.8 fL (ref 79.5–101.0)
MONO ABS: 0.5 10*3/uL (ref 0.1–0.9)
Monocytes Relative: 16 %
NEUTROS PCT: 49 %
Neutro Abs: 1.5 10*3/uL (ref 1.5–6.5)
PLATELETS: 84 10*3/uL — AB (ref 145–400)
RBC: 3.28 MIL/uL — AB (ref 3.70–5.45)
RDW: 15.4 % (ref 11.2–16.1)
WBC: 3.1 10*3/uL — AB (ref 3.9–10.3)

## 2017-02-27 MED ORDER — FAMOTIDINE IN NACL 20-0.9 MG/50ML-% IV SOLN
20.0000 mg | Freq: Two times a day (BID) | INTRAVENOUS | Status: DC
  Administered 2017-02-27: 20 mg via INTRAVENOUS

## 2017-02-27 MED ORDER — DEXAMETHASONE SODIUM PHOSPHATE 10 MG/ML IJ SOLN
INTRAMUSCULAR | Status: AC
  Filled 2017-02-27: qty 1

## 2017-02-27 MED ORDER — HEPARIN SOD (PORK) LOCK FLUSH 100 UNIT/ML IV SOLN
500.0000 [IU] | Freq: Once | INTRAVENOUS | Status: AC | PRN
  Administered 2017-02-27: 500 [IU]
  Filled 2017-02-27: qty 5

## 2017-02-27 MED ORDER — SODIUM CHLORIDE 0.9 % IV SOLN
570.0000 mg | Freq: Once | INTRAVENOUS | Status: AC
  Administered 2017-02-27: 570 mg via INTRAVENOUS
  Filled 2017-02-27: qty 57

## 2017-02-27 MED ORDER — DEXAMETHASONE SODIUM PHOSPHATE 10 MG/ML IJ SOLN
4.0000 mg | Freq: Once | INTRAMUSCULAR | Status: AC
  Administered 2017-02-27: 4 mg via INTRAVENOUS

## 2017-02-27 MED ORDER — DIPHENHYDRAMINE HCL 50 MG/ML IJ SOLN
50.0000 mg | Freq: Once | INTRAMUSCULAR | Status: AC
  Administered 2017-02-27: 50 mg via INTRAVENOUS

## 2017-02-27 MED ORDER — PALONOSETRON HCL INJECTION 0.25 MG/5ML
INTRAVENOUS | Status: AC
  Filled 2017-02-27: qty 5

## 2017-02-27 MED ORDER — SODIUM CHLORIDE 0.9% FLUSH
10.0000 mL | INTRAVENOUS | Status: DC | PRN
  Administered 2017-02-27: 10 mL
  Filled 2017-02-27: qty 10

## 2017-02-27 MED ORDER — PALONOSETRON HCL INJECTION 0.25 MG/5ML
0.2500 mg | Freq: Once | INTRAVENOUS | Status: AC
Start: 2017-02-27 — End: 2017-02-27
  Administered 2017-02-27: 0.25 mg via INTRAVENOUS

## 2017-02-27 MED ORDER — SODIUM CHLORIDE 0.9% FLUSH
10.0000 mL | INTRAVENOUS | Status: AC | PRN
  Administered 2017-02-27: 10 mL
  Filled 2017-02-27: qty 10

## 2017-02-27 MED ORDER — SODIUM CHLORIDE 0.9 % IV SOLN
Freq: Once | INTRAVENOUS | Status: AC
Start: 2017-02-27 — End: 2017-02-27
  Administered 2017-02-27: 11:00:00 via INTRAVENOUS

## 2017-02-27 MED ORDER — DIPHENHYDRAMINE HCL 50 MG/ML IJ SOLN
INTRAMUSCULAR | Status: AC
  Filled 2017-02-27: qty 1

## 2017-02-27 MED ORDER — FAMOTIDINE IN NACL 20-0.9 MG/50ML-% IV SOLN
INTRAVENOUS | Status: AC
  Filled 2017-02-27: qty 50

## 2017-02-27 NOTE — Progress Notes (Signed)
[facility] OFFICE PROGRESS NOTE  [name], Julie G, MD [place] [address]  DIAGNOSIS: 62 year old woman diagnosed with:  1. 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 This was diagnosed in July of 2014. 2. 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 The tumor is ER positive PR negative, HER-2 negative.  PRIOR THERAPY: She is status post paracentesis performed on 09/17/2012 with the cytology confirmed the presence of adenocarcinoma and immunohistochemical stains suggest GYN etiology. She is also status post lumpectomy for breast cancer diagnosed 25 years ago followed by radiation and chemotherapy under the care of Dr. Sonny Dandy. She did not receive any hormonal therapy. Chemotherapy utilizing carboplatin and Taxotere started on 10/01/2012. Avastin was added with cycle 4. This was discontinued in June of 2015. She is S/P Avastin maintenance only between June 2015 till March 2017. Therapy discontinued due to progression of disease. She is status post Doxil salvage chemotherapy therapy discontinued because of hypersensitivity reaction in March 2017.  CURRENT THERAPY: Carboplatin and AUC of 5 every 3 weeks cycle 1 started on 07/08/2015. She is here for her next cycle of therapy.  INTERVAL HISTORY: [name] 61 y.o. female returns for routine follow-up visit accompanied by her husband.  Patient has been off chemo since mid November for a treatment break over the holidays.  The patient reports that she feels fine today and has some mild fatigue.  She has not had any new complications or infusion related complications to her chemotherapy.  She denies fevers and chills.  Denies chest pain, shortness of breath, cough, hemoptysis.  She does have some mild nausea associated with chemotherapy which is controlled with Zofran.  She has  not had any recent nausea with since taking a break from chemotherapy.  Denies vomiting, constipation, and diarrhea.  Denies any bleeding or ecchymosis.  MEDICAL HISTORY: Past Medical History:  Diagnosis Date  . ADD (attention deficit disorder)   . Allergy    seasonal  . Anxiety   . Bipolar disorder ([place])   . Breast cancer ([facility]) 1989   right breast  . Cancer ([place])    Omentum  . Coronary artery disease   . Depression   . Hematochezia   . History of echocardiogram    a. Limited Echo 9/17: EF 60-65%, no RWMA, Gr 1 DD, trivial AI, trivial MR, GLS -12% (likely underestimated), no pericardial effusion  . Hyperlipidemia   . Hypothyroidism   . Maintenance chemotherapy    Pt has chemo every 3 weeks (on Friday)  . NSTEMI (non-ST elevated myocardial infarction) ([facility]) 10/26/2015  . Osteopenia 03/2009   t score -2.1 FRAX 4.6/0.4  . Premature menopause   . Sleep apnea    mild  . Urinary incontinence     ALLERGIES:  is allergic to doxil [doxorubicin hcl liposomal] and pollen extract.  MEDICATIONS:  Current Outpatient Medications  Medication Sig Dispense Refill  . acetaminophen (TYLENOL) 325 MG tablet Take 2 tablets (650 mg total) by mouth every 4 (four) hours as needed for mild pain, fever or headache. 30 tablet 0  . Acetylcarnitine HCl (ACETYL L-CARNITINE PO) Take 2,000 mg by mouth at bedtime.    . ALPRAZolam (XANAX) 0.5 MG tablet Take 0.5 mg by mouth 2 (two) times daily as needed for anxiety.    Marland Kitchen aspirin 81 MG chewable tablet Chew 1 tablet (81 mg total)  by mouth daily. 30 tablet 0  . atorvastatin (LIPITOR) 40 MG tablet Take 1 tablet (40 mg total) by mouth daily at 6 PM. 90 tablet 3  . carvedilol (COREG) 3.125 MG tablet Take 1/2 tablet by mouth twice daily with meals. 30 tablet 9  . chlorhexidine (PERIDEX) 0.12 % solution RINSE W/1 CAPFUL FOR 30 SEC & EXPECTORATE. REPEAT TWICE DAILY FOR 7-10 DAYS  0  . clotrimazole-betamethasone (LOTRISONE) cream Apply 1 application topically 2 (two)  times daily. 30 Padilla 1  . divalproex (DEPAKOTE) 250 MG DR tablet Take by mouth. Take 540ms in the morning and 7535m at night    . fluticasone (FLONASE) 50 MCG/ACT nasal spray INHALE 2 SPRAYS INTO EACH NOSTRIL EVERY NIGHT  11  . gabapentin (NEURONTIN) 100 MG capsule Take 1 capsule by mouth twice daily. 180 capsule 3  . lamoTRIgine (LAMICTAL) 100 MG tablet Take 100 mg by mouth 2 (two) times daily.    . Marland Kitchenevothyroxine (SYNTHROID, LEVOTHROID) 100 MCG tablet Take 1 tablet (100 mcg total) by mouth daily before breakfast. 90 tablet 3  . lidocaine-prilocaine (EMLA) cream Apply topically as needed. Apply to port with every chemotherapy. 30 Padilla 0  . loperamide (IMODIUM A-D) 2 MG tablet Take 2 mg by mouth as needed for diarrhea or loose stools.    . Marland Kitchenoratadine (CLARITIN) 10 MG tablet Take 10 mg by mouth daily as needed for allergies (with chemo).    . nitroGLYCERIN (NITROSTAT) 0.4 MG SL tablet Place 1 tablet (0.4 mg total) under the tongue every 5 (five) minutes as needed for chest pain. 25 tablet 3  . pantoprazole (PROTONIX) 40 MG tablet Take 1 tablet (40 mg total) by mouth daily. 90 tablet 1  . rOPINIRole (REQUIP) 0.5 MG tablet Take 3 tablets (1.5 mg total) by mouth at bedtime. 135 tablet 0  . sertraline (ZOLOFT) 50 MG tablet     . ondansetron (ZOFRAN ODT) 8 MG disintegrating tablet Take 1 tablet (8 mg total) by mouth every 8 (eight) hours as needed for nausea or vomiting. (Patient not taking: Reported on 02/27/2017) 20 tablet 1  . ondansetron (ZOFRAN) 8 MG tablet TAKE 1 TABLET BY MOUTH EVERY 8 HOURS AS NEEDED FOR NAUSEA AND VOMITING (Patient not taking: Reported on 02/27/2017) 30 tablet 2  . prochlorperazine (COMPAZINE) 10 MG tablet Take 1 tablet (10 mg total) by mouth every 6 (six) hours as needed for nausea or vomiting. (Patient not taking: Reported on 02/27/2017) 30 tablet 0   No current facility-administered medications for this visit.    Facility-Administered Medications Ordered in Other Visits  Medication  Dose Route Frequency Provider Last Rate Last Dose  . CARBOplatin (PARAPLATIN) 570 mg in sodium chloride 0.9 % 250 mL chemo infusion  570 mg Intravenous Once ShWyatt PortelaMD      . famotidine (PEPCID) IVPB 20 mg premix  20 mg Intravenous Q12H ShWyatt PortelaMD   20 mg at 02/27/17 1135  . heparin lock flush 100 unit/mL  500 Units Intravenous Once ShWyatt PortelaMD      . heparin lock flush 100 unit/mL  500 Units Intracatheter Once PRN ShWyatt PortelaMD      . sodium chloride flush (NS) 0.9 % injection 10 mL  10 mL Intravenous PRN ShWyatt PortelaMD      . sodium chloride flush (NS) 0.9 % injection 10 mL  10 mL Intracatheter PRN ShWyatt PortelaMD        SURGICAL HISTORY:  Past  Surgical History:  Procedure Laterality Date  . BREAST SURGERY  1989   RIGHT LUMPECTOMY, RADIATION AND CHEMO  . CARDIAC CATHETERIZATION N/A 10/26/2015   Procedure: Left Heart Cath and Coronary Angiography;  Surgeon: Wellington Hampshire, MD;  Location: [facility];  Service: Cardiovascular;  Laterality: N/A;  . CARDIAC CATHETERIZATION N/A 10/26/2015   Procedure: Coronary Stent Intervention;  Surgeon: Wellington Hampshire, MD;  Location: [facility];  Service: Cardiovascular;  Laterality: N/A;  . CARDIAC CATHETERIZATION N/A 11/08/2015   Procedure: Left Heart Cath and Coronary Angiography;  Surgeon: Jolaine Artist, MD;  Location: [facility];  Service: Cardiovascular;  Laterality: N/A;  . drug eluting stent  10/26/2015  . FOOT SURGERY  2013   BILATERAL   . HYSTEROSCOPY  2011   [place]  . PELVIC LAPAROSCOPY/ Hysteroscopy  1996    REVIEW OF SYSTEMS:   Review of Systems  Constitutional: Negative for appetite change, chills, fever and unexpected weight change. Positive for mild fatigue. HENT:   Negative for mouth sores, nosebleeds, sore throat and trouble swallowing.   Eyes: Negative for eye problems and icterus.  Respiratory: Negative for cough, hemoptysis, shortness of breath and wheezing.    Cardiovascular: Negative for chest pain and leg swelling.  Gastrointestinal: Negative for abdominal pain, constipation, diarrhea, nausea and vomiting.  Genitourinary: Negative for bladder incontinence, difficulty urinating, dysuria, frequency and hematuria.   Musculoskeletal: Negative for back pain, gait problem, neck pain and neck stiffness.  Skin: Negative for itching and rash.  Neurological: Negative for dizziness, extremity weakness, gait problem, headaches, light-headedness and seizures.  Hematological: Negative for adenopathy. Does not bruise/bleed easily.  Psychiatric/Behavioral: Negative for confusion, depression and sleep disturbance. The patient is not nervous/anxious.     PHYSICAL EXAMINATION:  Blood pressure 117/67, pulse (!) 58, temperature 98.1 F (36.7 C), temperature source Oral, resp. rate 18, height _0  (1.549 m), weight 152 lb 14.4 oz (69.4 kg), SpO2 100 %.  ECOG PERFORMANCE STATUS: 1 - Symptomatic but completely ambulatory  Physical Exam  Constitutional: Oriented to person, place, and time and well-developed, well-nourished, and in no distress. No distress.  HENT:  Head: Normocephalic and atraumatic.  Mouth/Throat: Oropharynx is clear and moist. No oropharyngeal exudate.  Eyes: Conjunctivae are normal. Right eye exhibits no discharge. Left eye exhibits no discharge. No scleral icterus.  Neck: Normal range of motion. Neck supple.  Cardiovascular: Normal rate, regular rhythm, normal heart sounds and intact distal pulses.   Pulmonary/Chest: Effort normal and breath sounds normal. No respiratory distress. No wheezes. No rales.  Abdominal: Soft. Bowel sounds are normal. Exhibits no distension and no mass. There is no tenderness.  Musculoskeletal: Normal range of motion. Exhibits no edema.  Lymphadenopathy:    No cervical adenopathy.  Neurological: Alert and oriented to person, place, and time. Exhibits normal muscle tone. Gait normal. Coordination normal.  Skin: Skin  is warm and dry. No rash noted. Not diaphoretic. No erythema. No pallor.  Psychiatric: Mood, memory and judgment normal.  Vitals reviewed.  LABORATORY DATA: Lab Results  Component Value Date   WBC 3.1 (L) 02/27/2017   HGB 10.0 (L) 02/27/2017   HCT 32.0 (L) 02/27/2017   MCV 97.8 02/27/2017   PLT 84 (L) 02/27/2017      Chemistry      Component Value Date/Time   NA 141 02/27/2017 0905   NA 142 01/21/2017 0822   K 4.2 02/27/2017 0905   K 4.1 01/21/2017 0822   CL 107 02/27/2017 0905  CO2 27 02/27/2017 0905   CO2 27 01/21/2017 0822   BUN 9 02/27/2017 0905   BUN 12.2 01/21/2017 0822   CREATININE 0.70 02/27/2017 0905   CREATININE 0.7 01/21/2017 0822      Component Value Date/Time   CALCIUM 8.6 02/27/2017 0905   CALCIUM 8.9 01/21/2017 0822   ALKPHOS 76 02/27/2017 0905   ALKPHOS 75 01/21/2017 0822   AST 13 02/27/2017 0905   AST 12 01/21/2017 0822   ALT 7 02/27/2017 0905   ALT 7 01/21/2017 0822   BILITOT 0.3 02/27/2017 0905   BILITOT 0.23 01/21/2017 0822       RADIOGRAPHIC STUDIES:  No results found.   ASSESSMENT/PLAN:  62 year old woman with the following issues:  1. Peritoneal carcinomatosis with omental involvement. The pathology confirmed the presence of adenocarcinoma of likely GYN etiology. Her CA 125 was elevated at 333.5 on 09/19/2012. She is status post systemic chemotherapy with an excellent response utilizing carboplatin and Taxotere and a Avastin . This therapy was held in June of 2015 and currently on Avastin maintenance.   CT scan obtained on 05/05/2015 as well as her tumor markers showed clear progression. Her CA-125 is up to 189 and her CT scan showed increase in her peritoneal disease.   She is currently receiving salvage therapy with carboplatin single agent at AUC of 5 with a reasonable response to therapy.  CT scan obtained on 12/28/2016 showed stable disease for the most part. Her CA-125 continues to be stable.  CA 125 is pending today.  The  patient had a treatment break over the holidays.  She will resume carboplatin with the same dose and schedule.  She will proceed with treatment today as scheduled.  2. IV access: Port-A-Cath is without complications.  No issues reported.  3. Thrombocytopenia: Her platelets unchanged at this time.  Okay to treat the patient with a platelet count of 84,000.  4. Abdominal pain: Very minor pelvic discomfort.  Imaging studies did not show any acute pathology.  Abdominal pain has resolved at this time.  5. Left breast mass: She status post biopsy with the pathology indicating T2 N1 disease with the tumor have ER positive, PR negative HER-2 negative. Her Ki-67 is 50%.  Mammogram obtained on 03/20/2016 showed excellent response to systemic chemotherapy. We'll continue to follow with periodic mammography.  8. Neutropenia: Neulasta will be given after each cycle of therapy.   9. Nausea: Manageable at this time.  10. Followup: She will follow-up in 3 weeks for chemotherapy infusion.    No orders of the defined types were placed in this encounter.   Mikey Bussing, DNP, AGPCNP-BC, AOCNP 02/27/17

## 2017-02-27 NOTE — Patient Instructions (Signed)
[facility] Discharge Instructions for Patients Receiving Chemotherapy  Today you received the following chemotherapy agents Carboplatin  To help prevent nausea and vomiting after your treatment, we encourage you to take your nausea medication as directed   If you develop nausea and vomiting that is not controlled by your nausea medication, call the clinic.   BELOW ARE SYMPTOMS THAT SHOULD BE REPORTED IMMEDIATELY:  *FEVER GREATER THAN 100.5 F  *CHILLS WITH OR WITHOUT FEVER  NAUSEA AND VOMITING THAT IS NOT CONTROLLED WITH YOUR NAUSEA MEDICATION  *UNUSUAL SHORTNESS OF BREATH  *UNUSUAL BRUISING OR BLEEDING  TENDERNESS IN MOUTH AND THROAT WITH OR WITHOUT PRESENCE OF ULCERS  *URINARY PROBLEMS  *BOWEL PROBLEMS  UNUSUAL RASH Items with * indicate a potential emergency and should be followed up as soon as possible.  Feel free to call the clinic should you have any questions or concerns. The clinic phone number is [phone].  Please show the CHEMO ALERT CARD at check-in to the Emergency Department and triage nurse.   

## 2017-02-27 NOTE — Progress Notes (Signed)
Ok to treat with labs (PLT) today per Erasmo Downer NP

## 2017-02-28 ENCOUNTER — Inpatient Hospital Stay: Payer: PPO

## 2017-02-28 VITALS — BP 118/65 | HR 75 | Temp 98.5°F | Resp 20

## 2017-02-28 DIAGNOSIS — C482 Malignant neoplasm of peritoneum, unspecified: Secondary | ICD-10-CM

## 2017-02-28 DIAGNOSIS — Z5111 Encounter for antineoplastic chemotherapy: Secondary | ICD-10-CM | POA: Diagnosis not present

## 2017-02-28 LAB — CA 125: Cancer Antigen (CA) 125: 33.8 U/mL (ref 0.0–38.1)

## 2017-02-28 MED ORDER — PEGFILGRASTIM INJECTION 6 MG/0.6ML ~~LOC~~
6.0000 mg | PREFILLED_SYRINGE | Freq: Once | SUBCUTANEOUS | Status: AC
  Administered 2017-02-28: 6 mg via SUBCUTANEOUS

## 2017-02-28 MED ORDER — PEGFILGRASTIM INJECTION 6 MG/0.6ML ~~LOC~~
PREFILLED_SYRINGE | SUBCUTANEOUS | Status: AC
  Filled 2017-02-28: qty 0.6

## 2017-02-28 NOTE — Patient Instructions (Signed)
Pegfilgrastim injection What is this medicine? PEGFILGRASTIM (PEG fil gra stim) is a long-acting granulocyte colony-stimulating factor that stimulates the growth of neutrophils, a type of white blood cell important in the body's fight against infection. It is used to reduce the incidence of fever and infection in patients with certain types of cancer who are receiving chemotherapy that affects the bone marrow, and to increase survival after being exposed to high doses of radiation. This medicine may be used for other purposes; ask your health care provider or pharmacist if you have questions. What should I tell my health care provider before I take this medicine? They need to know if you have any of these conditions: -kidney disease -latex allergy -ongoing radiation therapy -sickle cell disease -skin reactions to acrylic adhesives (On-Body Injector only) -an unusual or allergic reaction to pegfilgrastim, filgrastim, other medicines, foods, dyes, or preservatives -pregnant or trying to get pregnant -breast-feeding How should I use this medicine? This medicine is for injection under the skin. If you get this medicine at home, you will be taught how to prepare and give the pre-filled syringe or how to use the On-body Injector. Refer to the patient Instructions for Use for detailed instructions. Use exactly as directed. Take your medicine at regular intervals. Do not take your medicine more often than directed. It is important that you put your used needles and syringes in a special sharps container. Do not put them in a trash can. If you do not have a sharps container, call your pharmacist or healthcare provider to get one. Talk to your pediatrician regarding the use of this medicine in children. While this drug may be prescribed for selected conditions, precautions do apply. Overdosage: If you think you have taken too much of this medicine contact a poison control center or emergency room at  once. NOTE: This medicine is only for you. Do not share this medicine with others. What if I miss a dose? It is important not to miss your dose. Call your doctor or health care professional if you miss your dose. If you miss a dose due to an On-body Injector failure or leakage, a new dose should be administered as soon as possible using a single prefilled syringe for manual use. What may interact with this medicine? Interactions have not been studied. Give your health care provider a list of all the medicines, herbs, non-prescription drugs, or dietary supplements you use. Also tell them if you smoke, drink alcohol, or use illegal drugs. Some items may interact with your medicine. This list may not describe all possible interactions. Give your health care provider a list of all the medicines, herbs, non-prescription drugs, or dietary supplements you use. Also tell them if you smoke, drink alcohol, or use illegal drugs. Some items may interact with your medicine. What should I watch for while using this medicine? You may need blood work done while you are taking this medicine. If you are going to need a MRI, CT scan, or other procedure, tell your doctor that you are using this medicine (On-Body Injector only). What side effects may I notice from receiving this medicine? Side effects that you should report to your doctor or health care professional as soon as possible: -allergic reactions like skin rash, itching or hives, swelling of the face, lips, or tongue -dizziness -fever -pain, redness, or irritation at site where injected -pinpoint red spots on the skin -red or dark-brown urine -shortness of breath or breathing problems -stomach or side pain, or pain   at the shoulder -swelling -tiredness -trouble passing urine or change in the amount of urine Side effects that usually do not require medical attention (report to your doctor or health care professional if they continue or are  bothersome): -bone pain -muscle pain This list may not describe all possible side effects. Call your doctor for medical advice about side effects. You may report side effects to FDA at 1-800-FDA-1088. Where should I keep my medicine? Keep out of the reach of children. Store pre-filled syringes in a refrigerator between 2 and 8 degrees C (36 and 46 degrees F). Do not freeze. Keep in carton to protect from light. Throw away this medicine if it is left out of the refrigerator for more than 48 hours. Throw away any unused medicine after the expiration date. NOTE: This sheet is a summary. It may not cover all possible information. If you have questions about this medicine, talk to your doctor, pharmacist, or health care provider.    2016, Elsevier/Gold Standard. (2014-02-25 14:30:14)  

## 2017-03-01 ENCOUNTER — Telehealth: Payer: Self-pay | Admitting: Oncology

## 2017-03-01 NOTE — Telephone Encounter (Signed)
Per 1/9 no los °

## 2017-03-05 DIAGNOSIS — C50911 Malignant neoplasm of unspecified site of right female breast: Secondary | ICD-10-CM | POA: Diagnosis not present

## 2017-03-11 DIAGNOSIS — G4733 Obstructive sleep apnea (adult) (pediatric): Secondary | ICD-10-CM | POA: Diagnosis not present

## 2017-03-12 DIAGNOSIS — G4733 Obstructive sleep apnea (adult) (pediatric): Secondary | ICD-10-CM | POA: Diagnosis not present

## 2017-03-18 ENCOUNTER — Other Ambulatory Visit: Payer: Self-pay | Admitting: Family Medicine

## 2017-03-18 DIAGNOSIS — E785 Hyperlipidemia, unspecified: Secondary | ICD-10-CM

## 2017-03-18 DIAGNOSIS — E039 Hypothyroidism, unspecified: Secondary | ICD-10-CM

## 2017-03-19 ENCOUNTER — Telehealth: Payer: Self-pay | Admitting: Pulmonary Disease

## 2017-03-19 DIAGNOSIS — G4733 Obstructive sleep apnea (adult) (pediatric): Secondary | ICD-10-CM

## 2017-03-19 NOTE — Telephone Encounter (Signed)
Per RA, HST showed severe OSA with 91 events per hour. Recommends RX of auto cpap 5-15cm, mask of choice and DL in 4 weeks. OV in 4-6 weeks with RA or TP.

## 2017-03-20 ENCOUNTER — Inpatient Hospital Stay (HOSPITAL_BASED_OUTPATIENT_CLINIC_OR_DEPARTMENT_OTHER): Payer: PPO | Admitting: Oncology

## 2017-03-20 ENCOUNTER — Inpatient Hospital Stay: Payer: PPO

## 2017-03-20 VITALS — BP 125/60 | HR 62 | Temp 98.1°F | Resp 20 | Ht 61.0 in | Wt 151.4 lb

## 2017-03-20 DIAGNOSIS — C50412 Malignant neoplasm of upper-outer quadrant of left female breast: Secondary | ICD-10-CM

## 2017-03-20 DIAGNOSIS — Z170 Estrogen receptor positive status [ER+]: Principal | ICD-10-CM

## 2017-03-20 DIAGNOSIS — C482 Malignant neoplasm of peritoneum, unspecified: Secondary | ICD-10-CM

## 2017-03-20 DIAGNOSIS — Z95828 Presence of other vascular implants and grafts: Secondary | ICD-10-CM

## 2017-03-20 DIAGNOSIS — C50919 Malignant neoplasm of unspecified site of unspecified female breast: Secondary | ICD-10-CM

## 2017-03-20 DIAGNOSIS — Z5111 Encounter for antineoplastic chemotherapy: Secondary | ICD-10-CM | POA: Diagnosis not present

## 2017-03-20 LAB — CBC WITH DIFFERENTIAL (CANCER CENTER ONLY)
Basophils Absolute: 0 10*3/uL (ref 0.0–0.1)
Basophils Relative: 0 %
Eosinophils Absolute: 0.1 10*3/uL (ref 0.0–0.5)
Eosinophils Relative: 3 %
HEMATOCRIT: 32.8 % — AB (ref 34.8–46.6)
Hemoglobin: 9.8 g/dL — ABNORMAL LOW (ref 11.6–15.9)
LYMPHS ABS: 1.2 10*3/uL (ref 0.9–3.3)
LYMPHS PCT: 37 %
MCH: 30 pg (ref 25.1–34.0)
MCHC: 29.9 g/dL — ABNORMAL LOW (ref 31.5–36.0)
MCV: 100.3 fL (ref 79.5–101.0)
MONOS PCT: 8 %
Monocytes Absolute: 0.2 10*3/uL (ref 0.1–0.9)
Neutro Abs: 1.6 10*3/uL (ref 1.5–6.5)
Neutrophils Relative %: 52 %
Platelet Count: 56 10*3/uL — ABNORMAL LOW (ref 145–400)
RBC: 3.27 MIL/uL — AB (ref 3.70–5.45)
RDW: 15.9 % — ABNORMAL HIGH (ref 11.2–14.5)
WBC: 3.1 10*3/uL — AB (ref 3.9–10.3)

## 2017-03-20 LAB — CMP (CANCER CENTER ONLY)
ALT: 5 U/L (ref 0–55)
AST: 12 U/L (ref 5–34)
Albumin: 3.4 g/dL — ABNORMAL LOW (ref 3.5–5.0)
Alkaline Phosphatase: 76 U/L (ref 40–150)
Anion gap: 7 (ref 3–11)
BUN: 12 mg/dL (ref 7–26)
CO2: 28 mmol/L (ref 22–29)
CREATININE: 0.67 mg/dL (ref 0.60–1.10)
Calcium: 9 mg/dL (ref 8.4–10.4)
Chloride: 107 mmol/L (ref 98–109)
Glucose, Bld: 89 mg/dL (ref 70–140)
Potassium: 4 mmol/L (ref 3.5–5.1)
SODIUM: 142 mmol/L (ref 136–145)
Total Bilirubin: 0.4 mg/dL (ref 0.2–1.2)
Total Protein: 7.4 g/dL (ref 6.4–8.3)

## 2017-03-20 MED ORDER — DIPHENHYDRAMINE HCL 50 MG/ML IJ SOLN
INTRAMUSCULAR | Status: AC
  Filled 2017-03-20: qty 1

## 2017-03-20 MED ORDER — PALONOSETRON HCL INJECTION 0.25 MG/5ML
INTRAVENOUS | Status: AC
  Filled 2017-03-20: qty 5

## 2017-03-20 MED ORDER — PALONOSETRON HCL INJECTION 0.25 MG/5ML
0.2500 mg | Freq: Once | INTRAVENOUS | Status: AC
  Administered 2017-03-20: 0.25 mg via INTRAVENOUS

## 2017-03-20 MED ORDER — HEPARIN SOD (PORK) LOCK FLUSH 100 UNIT/ML IV SOLN
500.0000 [IU] | Freq: Once | INTRAVENOUS | Status: AC | PRN
  Administered 2017-03-20: 500 [IU]
  Filled 2017-03-20: qty 5

## 2017-03-20 MED ORDER — SODIUM CHLORIDE 0.9% FLUSH
10.0000 mL | INTRAVENOUS | Status: DC | PRN
  Administered 2017-03-20: 10 mL
  Filled 2017-03-20: qty 10

## 2017-03-20 MED ORDER — SODIUM CHLORIDE 0.9 % IJ SOLN
10.0000 mL | INTRAMUSCULAR | Status: DC | PRN
  Administered 2017-03-20: 10 mL via INTRAVENOUS
  Filled 2017-03-20: qty 10

## 2017-03-20 MED ORDER — FAMOTIDINE IN NACL 20-0.9 MG/50ML-% IV SOLN
20.0000 mg | Freq: Two times a day (BID) | INTRAVENOUS | Status: DC
  Administered 2017-03-20: 20 mg via INTRAVENOUS

## 2017-03-20 MED ORDER — DEXAMETHASONE SODIUM PHOSPHATE 10 MG/ML IJ SOLN
INTRAMUSCULAR | Status: AC
  Filled 2017-03-20: qty 1

## 2017-03-20 MED ORDER — FAMOTIDINE IN NACL 20-0.9 MG/50ML-% IV SOLN
INTRAVENOUS | Status: AC
  Filled 2017-03-20: qty 50

## 2017-03-20 MED ORDER — DIPHENHYDRAMINE HCL 50 MG/ML IJ SOLN
50.0000 mg | Freq: Once | INTRAMUSCULAR | Status: AC
  Administered 2017-03-20: 50 mg via INTRAVENOUS

## 2017-03-20 MED ORDER — DEXAMETHASONE SODIUM PHOSPHATE 10 MG/ML IJ SOLN
4.0000 mg | Freq: Once | INTRAMUSCULAR | Status: AC
  Administered 2017-03-20: 4 mg via INTRAVENOUS

## 2017-03-20 MED ORDER — SODIUM CHLORIDE 0.9 % IV SOLN
Freq: Once | INTRAVENOUS | Status: AC
  Administered 2017-03-20: 10:00:00 via INTRAVENOUS

## 2017-03-20 MED ORDER — SODIUM CHLORIDE 0.9 % IV SOLN
570.0000 mg | Freq: Once | INTRAVENOUS | Status: AC
  Administered 2017-03-20: 570 mg via INTRAVENOUS
  Filled 2017-03-20: qty 57

## 2017-03-20 NOTE — Progress Notes (Signed)
Hematology and Oncology Follow Up Visit  [name] [id number] 11-Mar-1955 62 y.o. 03/20/2017 10:06 AM    Principle Diagnosis: 62 year old woman with:  1. 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  The pathology suggests ovarian cancer etiology.  2. 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 The tumor is ER positive PR negative, HER-2 negative.   Prior Therapy:  Chemotherapy utilizing carboplatin and Taxotere started on 10/01/2012. Avastin was added with cycle 4. This was discontinued in June of 2015.  She is S/P Avastin maintenance only between June 2015 till March 2017. Therapy discontinued due to progression of disease.  She is status post Doxil salvage chemotherapy therapy discontinued because of hypersensitivity reaction in March 2017.  Current therapy:  Carboplatin and AUC of 5 every 3 weeks cycle 1 started on 07/08/2015 and had an excellent response since that time.  She is here for the next cycle of therapy.  Interim History: [name] is here with her husband for a follow-up.  She reports urinary frequency and occasional incontinence since the last visit.  She had issues with stress incontinence in the past associated with frequency and urgency.  She has been evaluated by urology and her symptoms have subsided and appears to be recurring lately.  She denies any dysuria or hematuria at this time.  She tolerated the last chemotherapy cycle without complications.  He denies any nausea, vomiting or worsening neuropathy.  She denies any abdominal pain or decline in her performance status.  Her quality of life remains about the same.  She did have one episode of a fall after she slipped in the kitchen floor.  She denies any bone fractures or increased pain.  She was sore for 24 hours and these symptoms have resolved.  She does not report any headaches, blurry vision, syncope or seizures. She  denied any fever, chills or sweats.  She does not report any chest pain, palpitation, orthopnea or leg edema.  Has not reported any shortness of breath or cough. She denied any pain with swallowing. She does not report any vomiting, constipation or hematochezia.. She denied any GYN bleeding. She reports no anxiety or depression.  She does not report any hematuria or dysuria.  He does report frequency and incontinence.  She does not report any heat or cold intolerance.  She does not report any arthralgias or myalgias.  She does not report any lymphadenopathy or petechiae.  Remainder of the review of systems is negative.   Medications: I have reviewed the patient's current medications.  Current Outpatient Medications  Medication Sig Dispense Refill  . acetaminophen (TYLENOL) 325 MG tablet Take 2 tablets (650 mg total) by mouth every 4 (four) hours as needed for mild pain, fever or headache. 30 tablet 0  . Acetylcarnitine HCl (ACETYL L-CARNITINE PO) Take 2,000 mg by mouth at bedtime.    . ALPRAZolam (XANAX) 0.5 MG tablet Take 0.5 mg by mouth 2 (two) times daily as needed for anxiety.    Marland Kitchen aspirin 81 MG chewable tablet Chew 1 tablet (81 mg total) by mouth daily. 30 tablet 0  . atorvastatin (LIPITOR) 40 MG tablet Take 1 tablet (40 mg total) by mouth daily at 6 PM. 90 tablet 2  . carvedilol (COREG) 3.125 MG tablet Take 1/2 tablet by mouth twice daily with meals. 30 tablet 9  . chlorhexidine (PERIDEX) 0.12 % solution RINSE W/1 CAPFUL FOR 30 SEC & EXPECTORATE. REPEAT TWICE DAILY  FOR 7-10 DAYS  0  . clotrimazole-betamethasone (LOTRISONE) cream Apply 1 application topically 2 (two) times daily. 30 g 1  . divalproex (DEPAKOTE) 250 MG DR tablet Take by mouth. Take 551ms in the morning and 7540m at night    . fluticasone (FLONASE) 50 MCG/ACT nasal spray INHALE 2 SPRAYS INTO EACH NOSTRIL EVERY NIGHT  11  . gabapentin (NEURONTIN) 100 MG capsule Take 1 capsule by mouth twice daily. 180 capsule 3  . lamoTRIgine  (LAMICTAL) 100 MG tablet Take 100 mg by mouth 2 (two) times daily.    . Marland Kitchenevothyroxine (SYNTHROID, LEVOTHROID) 100 MCG tablet Take 1 tablet (100 mcg total) by mouth daily before breakfast. 90 tablet 2  . lidocaine-prilocaine (EMLA) cream Apply topically as needed. Apply to port with every chemotherapy. 30 g 0  . loperamide (IMODIUM A-D) 2 MG tablet Take 2 mg by mouth as needed for diarrhea or loose stools.    . Marland Kitchenoratadine (CLARITIN) 10 MG tablet Take 10 mg by mouth daily as needed for allergies (with chemo).    . nitroGLYCERIN (NITROSTAT) 0.4 MG SL tablet Place 1 tablet (0.4 mg total) under the tongue every 5 (five) minutes as needed for chest pain. 25 tablet 3  . ondansetron (ZOFRAN ODT) 8 MG disintegrating tablet Take 1 tablet (8 mg total) by mouth every 8 (eight) hours as needed for nausea or vomiting. 20 tablet 1  . ondansetron (ZOFRAN) 8 MG tablet TAKE 1 TABLET BY MOUTH EVERY 8 HOURS AS NEEDED FOR NAUSEA AND VOMITING 30 tablet 2  . pantoprazole (PROTONIX) 40 MG tablet Take 1 tablet (40 mg total) by mouth daily. 90 tablet 1  . prochlorperazine (COMPAZINE) 10 MG tablet Take 1 tablet (10 mg total) by mouth every 6 (six) hours as needed for nausea or vomiting. 30 tablet 0  . rOPINIRole (REQUIP) 0.5 MG tablet Take 3 tablets by mouth at bedtime. 90 tablet 1  . sertraline (ZOLOFT) 50 MG tablet      No current facility-administered medications for this visit.    Facility-Administered Medications Ordered in Other Visits  Medication Dose Route Frequency Provider Last Rate Last Dose  . heparin lock flush 100 unit/mL  500 Units Intravenous Once ShWyatt PortelaMD      . sodium chloride flush (NS) 0.9 % injection 10 mL  10 mL Intravenous PRN ShWyatt PortelaMD         Allergies:  Allergies  Allergen Reactions  . Doxil [Doxorubicin Hcl Liposomal] Anaphylaxis    1st Doxil.   . Pollen Extract Other (See Comments)    Pollen and grass causes a lot sneezing    Past Medical History, Surgical  history, Social history, and Family History reviewed and unchanged.    Physical Exam: Blood pressure 125/60, pulse 62, temperature 98.1 F (36.7 C), temperature source Oral, resp. rate 20, height _0  (1.549 m), weight 151 lb 6.4 oz (68.7 kg), SpO2 100 %.  ECOG: 1 General appearance: Alert, awake woman appeared comfortable. Head: Normocephalic, without obvious abnormality.  Oral mucosa: Moist and pink without ulcers.   Eyes: No scleral icterus. Lymph nodes: Cervical, supraclavicular, and axillary nodes normal. Heart:regular rate and rhythm, S1, S2 normal, no murmur, click, rub or gallop Lung: Clear to auscultation in all lung fields.  No rhonchi or wheezes.  No dullness to percussion. Chest wall examination: No erythema or induration noted.  Port-A-Cath in place. Abdomin: Soft, nontender with good bowel sounds in all 4 quadrants.  No rebound or guarding. Musculoskeletal: No  joint deformity or effusion. Skin: No rashes or lesions. No ecchymosis. Neurological: No deficits.  Ambulating without difficulties. Psychiatric: Mood is appropriate with appropriate affect.   Lab Results: Lab Results  Component Value Date   WBC 3.1 (L) 03/20/2017   HGB 10.0 (L) 02/27/2017   HCT 32.8 (L) 03/20/2017   MCV 100.3 03/20/2017   PLT 56 (L) 03/20/2017     Chemistry      Component Value Date/Time   NA 141 02/27/2017 0905   NA 142 01/21/2017 0822   K 4.2 02/27/2017 0905   K 4.1 01/21/2017 0822   CL 107 02/27/2017 0905   CO2 27 02/27/2017 0905   CO2 27 01/21/2017 0822   BUN 9 02/27/2017 0905   BUN 12.2 01/21/2017 0822   CREATININE 0.70 02/27/2017 0905   CREATININE 0.7 01/21/2017 0822      Component Value Date/Time   CALCIUM 8.6 02/27/2017 0905   CALCIUM 8.9 01/21/2017 0822   ALKPHOS 76 02/27/2017 0905   ALKPHOS 75 01/21/2017 0822   AST 13 02/27/2017 0905   AST 12 01/21/2017 0822   ALT 7 02/27/2017 0905   ALT 7 01/21/2017 0822   BILITOT 0.3 02/27/2017 0905   BILITOT 0.23 01/21/2017  0822     Results for ARDELLA, CHHIM (MRN [id number]) as of 03/20/2017 09:37  Ref. Range 12/31/2016 08:49 01/21/2017 08:21 02/27/2017 09:05  Cancer Antigen (CA) 125 Latest Ref Range: 0.0 - 38.1 U/mL 25.2 31.5 33.8     Impression and Plan:  62 year old woman with the following issues:  1. Peritoneal carcinomatosis with omental involvement arising from an ovarian primary.  She is status post multiple systemic therapy outlined above without any primary surgical therapy.   She is currently receiving salvage therapy with carboplatin single agent at AUC of 5 with excellent response since 2017.  CT scan obtained on 12/28/2016 was reviewed again and showed stable disease.  Her tumor markers have been slowly rising although could be related to her missing treatments in December.  Risks and benefits of continuing this chemotherapy was reviewed today and she is agreeable to continue.  This chemotherapy has been able to palliate her disease and keep her cancer under control for an extended period of time.  She will have repeat CT scan in March 2019..  2. IV access: No complications noted to her Port-A-Cath.  This will remain to be in use.  3. 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  No active bleeding noted at this time.  No intervention is needed.  4. Left breast cancer She status post biopsy with the pathology indicating T2 N1 invasive ductal carcinoma with the tumor have ER positive, PR negative HER-2 negative. Her Ki-67 is 50%.  Mammogram obtained on 03/20/2016 showed excellent response to systemic chemotherapy.  Primary surgical therapy was deferred because of her other malignancy which will dictate her overall survival.  Surgery can be used in the future if she develops any local recurrence.  5. Neutropenia: Related to chemotherapy without fever or infection.  Neulasta will be given after a cycles of therapy.   6. Nausea: Manageable with her current antiemetics at this  time.  No recent vomiting or exacerbation.  7. Followup: She will follow-up in 3 weeks for chemotherapy infusion.  She will have repeat CT scan in March 2019 that was ordered today.  She has an MD follow-up set up on March 13.  25  minutes was spent with the patient face-to-face today.  More than 50% of time  was dedicated to patient counseling, education and coordination of her multifaceted care.   Zola Button, MD 1/30/201910:06 AM

## 2017-03-20 NOTE — Patient Instructions (Signed)
Implanted Port Home Guide An implanted port is a type of central line that is placed under the skin. Central lines are used to provide IV access when treatment or nutrition needs to be given through a person's veins. Implanted ports are used for long-term IV access. An implanted port may be placed because:  You need IV medicine that would be irritating to the small veins in your hands or arms.  You need long-term IV medicines, such as antibiotics.  You need IV nutrition for a long period.  You need frequent blood draws for lab tests.  You need dialysis.  Implanted ports are usually placed in the chest area, but they can also be placed in the upper arm, the abdomen, or the leg. An implanted port has two main parts:  Reservoir. The reservoir is round and will appear as a small, raised area under your skin. The reservoir is the part where a needle is inserted to give medicines or draw blood.  Catheter. The catheter is a thin, flexible tube that extends from the reservoir. The catheter is placed into a large vein. Medicine that is inserted into the reservoir goes into the catheter and then into the vein.  How will I care for my incision site? Do not get the incision site wet. Bathe or shower as directed by your health care provider. How is my port accessed? Special steps must be taken to access the port:  Before the port is accessed, a numbing cream can be placed on the skin. This helps numb the skin over the port site.  Your health care provider uses a sterile technique to access the port. ? Your health care provider must put on a mask and sterile gloves. ? The skin over your port is cleaned carefully with an antiseptic and allowed to dry. ? The port is gently pinched between sterile gloves, and a needle is inserted into the port.  Only "non-coring" port needles should be used to access the port. Once the port is accessed, a blood return should be checked. This helps ensure that the port  is in the vein and is not clogged.  If your port needs to remain accessed for a constant infusion, a clear (transparent) bandage will be placed over the needle site. The bandage and needle will need to be changed every week, or as directed by your health care provider.  Keep the bandage covering the needle clean and dry. Do not get it wet. Follow your health care provider's instructions on how to take a shower or bath while the port is accessed.  If your port does not need to stay accessed, no bandage is needed over the port.  What is flushing? Flushing helps keep the port from getting clogged. Follow your health care provider's instructions on how and when to flush the port. Ports are usually flushed with saline solution or a medicine called heparin. The need for flushing will depend on how the port is used.  If the port is used for intermittent medicines or blood draws, the port will need to be flushed: ? After medicines have been given. ? After blood has been drawn. ? As part of routine maintenance.  If a constant infusion is running, the port may not need to be flushed.  How long will my port stay implanted? The port can stay in for as long as your health care provider thinks it is needed. When it is time for the port to come out, surgery will be   done to remove it. The procedure is similar to the one performed when the port was put in. When should I seek immediate medical care? When you have an implanted port, you should seek immediate medical care if:  You notice a bad smell coming from the incision site.  You have swelling, redness, or drainage at the incision site.  You have more swelling or pain at the port site or the surrounding area.  You have a fever that is not controlled with medicine.  This information is not intended to replace advice given to you by your health care provider. Make sure you discuss any questions you have with your health care provider. Document  Released: 02/05/2005 Document Revised: 07/14/2015 Document Reviewed: 10/13/2012 Elsevier Interactive Patient Education  2017 [employer]  

## 2017-03-20 NOTE — Telephone Encounter (Signed)
Left message for patient to call back for results.  

## 2017-03-20 NOTE — Patient Instructions (Signed)
[facility] Discharge Instructions for Patients Receiving Chemotherapy  Today you received the following chemotherapy agents Carboplatin  To help prevent nausea and vomiting after your treatment, we encourage you to take your nausea medication as directed   If you develop nausea and vomiting that is not controlled by your nausea medication, call the clinic.   BELOW ARE SYMPTOMS THAT SHOULD BE REPORTED IMMEDIATELY:  *FEVER GREATER THAN 100.5 F  *CHILLS WITH OR WITHOUT FEVER  NAUSEA AND VOMITING THAT IS NOT CONTROLLED WITH YOUR NAUSEA MEDICATION  *UNUSUAL SHORTNESS OF BREATH  *UNUSUAL BRUISING OR BLEEDING  TENDERNESS IN MOUTH AND THROAT WITH OR WITHOUT PRESENCE OF ULCERS  *URINARY PROBLEMS  *BOWEL PROBLEMS  UNUSUAL RASH Items with * indicate a potential emergency and should be followed up as soon as possible.  Feel free to call the clinic should you have any questions or concerns. The clinic phone number is [phone].  Please show the CHEMO ALERT CARD at check-in to the Emergency Department and triage nurse.   

## 2017-03-20 NOTE — Progress Notes (Signed)
Per Dr. Alen Blew, okay to treat pt with Plt of 56

## 2017-03-21 ENCOUNTER — Inpatient Hospital Stay: Payer: PPO

## 2017-03-21 VITALS — BP 115/76 | HR 62 | Temp 98.3°F | Resp 18

## 2017-03-21 DIAGNOSIS — Z5111 Encounter for antineoplastic chemotherapy: Secondary | ICD-10-CM | POA: Diagnosis not present

## 2017-03-21 DIAGNOSIS — C482 Malignant neoplasm of peritoneum, unspecified: Secondary | ICD-10-CM

## 2017-03-21 LAB — CA 125: Cancer Antigen (CA) 125: 31.2 U/mL (ref 0.0–38.1)

## 2017-03-21 MED ORDER — PEGFILGRASTIM INJECTION 6 MG/0.6ML ~~LOC~~
6.0000 mg | PREFILLED_SYRINGE | Freq: Once | SUBCUTANEOUS | Status: AC
  Administered 2017-03-21: 6 mg via SUBCUTANEOUS

## 2017-03-21 NOTE — Telephone Encounter (Signed)
Advised pt of results. Pt understood and nothing further is needed.   CPAP ordered.  

## 2017-03-21 NOTE — Telephone Encounter (Signed)
Patient returning call - she can be reached at [phone] -pr

## 2017-03-27 ENCOUNTER — Other Ambulatory Visit: Payer: Self-pay | Admitting: *Deleted

## 2017-03-27 ENCOUNTER — Telehealth: Payer: Self-pay | Admitting: Pulmonary Disease

## 2017-03-27 DIAGNOSIS — G4733 Obstructive sleep apnea (adult) (pediatric): Secondary | ICD-10-CM

## 2017-03-27 NOTE — Telephone Encounter (Signed)
Patient was told from [name] that they received order for supplies but not the actual machine. Per chart this was ordered on 1.31.19 as a new start CPAP. DME [facility].    [facility] please advise

## 2017-03-27 NOTE — Telephone Encounter (Signed)
[facility] has

## 2017-03-28 NOTE — Telephone Encounter (Signed)
I called [employer] & spoke to [place].  She verified they do have order for cpap.  Customer Service looked at the order incorrectly.  She states order is in process now & she spoke to pt yesterday & notified her.  Nothing further needed.

## 2017-04-02 ENCOUNTER — Other Ambulatory Visit: Payer: Self-pay | Admitting: *Deleted

## 2017-04-02 DIAGNOSIS — E785 Hyperlipidemia, unspecified: Secondary | ICD-10-CM

## 2017-04-02 DIAGNOSIS — E039 Hypothyroidism, unspecified: Secondary | ICD-10-CM

## 2017-04-02 MED ORDER — ATORVASTATIN CALCIUM 40 MG PO TABS: 40.0000 mg | ORAL_TABLET | Freq: Every day | ORAL | 2 refills | Status: DC

## 2017-04-02 MED ORDER — LEVOTHYROXINE SODIUM 100 MCG PO TABS: 100.0000 ug | ORAL_TABLET | Freq: Every day | ORAL | 2 refills | Status: DC

## 2017-04-03 DIAGNOSIS — G4733 Obstructive sleep apnea (adult) (pediatric): Secondary | ICD-10-CM | POA: Diagnosis not present

## 2017-04-10 ENCOUNTER — Encounter: Payer: Self-pay | Admitting: Oncology

## 2017-04-10 ENCOUNTER — Telehealth: Payer: Self-pay | Admitting: Oncology

## 2017-04-10 ENCOUNTER — Inpatient Hospital Stay: Payer: PPO | Attending: Oncology

## 2017-04-10 ENCOUNTER — Telehealth: Payer: Self-pay

## 2017-04-10 ENCOUNTER — Other Ambulatory Visit: Payer: Self-pay | Admitting: Oncology

## 2017-04-10 ENCOUNTER — Inpatient Hospital Stay: Payer: PPO

## 2017-04-10 ENCOUNTER — Other Ambulatory Visit: Payer: Self-pay

## 2017-04-10 ENCOUNTER — Inpatient Hospital Stay (HOSPITAL_BASED_OUTPATIENT_CLINIC_OR_DEPARTMENT_OTHER): Payer: PPO | Admitting: Oncology

## 2017-04-10 VITALS — BP 113/60 | HR 61 | Temp 97.7°F | Resp 18 | Wt 153.2 lb

## 2017-04-10 DIAGNOSIS — C482 Malignant neoplasm of peritoneum, unspecified: Secondary | ICD-10-CM | POA: Insufficient documentation

## 2017-04-10 DIAGNOSIS — Z853 Personal history of malignant neoplasm of breast: Secondary | ICD-10-CM

## 2017-04-10 DIAGNOSIS — R110 Nausea: Secondary | ICD-10-CM | POA: Diagnosis not present

## 2017-04-10 DIAGNOSIS — D696 Thrombocytopenia, unspecified: Secondary | ICD-10-CM

## 2017-04-10 DIAGNOSIS — Z170 Estrogen receptor positive status [ER+]: Secondary | ICD-10-CM

## 2017-04-10 DIAGNOSIS — Z95828 Presence of other vascular implants and grafts: Secondary | ICD-10-CM

## 2017-04-10 DIAGNOSIS — Z923 Personal history of irradiation: Secondary | ICD-10-CM

## 2017-04-10 DIAGNOSIS — Z5189 Encounter for other specified aftercare: Secondary | ICD-10-CM | POA: Diagnosis not present

## 2017-04-10 DIAGNOSIS — C50919 Malignant neoplasm of unspecified site of unspecified female breast: Secondary | ICD-10-CM

## 2017-04-10 DIAGNOSIS — Z5111 Encounter for antineoplastic chemotherapy: Secondary | ICD-10-CM | POA: Diagnosis not present

## 2017-04-10 DIAGNOSIS — C50412 Malignant neoplasm of upper-outer quadrant of left female breast: Secondary | ICD-10-CM

## 2017-04-10 DIAGNOSIS — D61818 Other pancytopenia: Secondary | ICD-10-CM

## 2017-04-10 LAB — CMP (CANCER CENTER ONLY)
ALK PHOS: 77 U/L (ref 40–150)
AST: 11 U/L (ref 5–34)
Albumin: 3.3 g/dL — ABNORMAL LOW (ref 3.5–5.0)
Anion gap: 9 (ref 3–11)
BUN: 12 mg/dL (ref 7–26)
CALCIUM: 8.8 mg/dL (ref 8.4–10.4)
CHLORIDE: 107 mmol/L (ref 98–109)
CO2: 26 mmol/L (ref 22–29)
CREATININE: 0.7 mg/dL (ref 0.60–1.10)
GFR, Est AFR Am: >60 mL/min (ref >60)
Glucose, Bld: 94 mg/dL (ref 70–140)
Potassium: 3.7 mmol/L (ref 3.5–5.1)
Sodium: 142 mmol/L (ref 136–145)
TOTAL PROTEIN: 7 g/dL (ref 6.4–8.3)

## 2017-04-10 LAB — CBC WITH DIFFERENTIAL (CANCER CENTER ONLY)
BASOS ABS: 0 10*3/uL (ref 0.0–0.1)
BASOS PCT: 0 %
EOS ABS: 0 10*3/uL (ref 0.0–0.5)
EOS PCT: 0 %
HCT: 29.8 % — ABNORMAL LOW (ref 34.8–46.6)
HEMOGLOBIN: 8.9 g/dL — AB (ref 11.6–15.9)
Lymphocytes Relative: 49 %
Lymphs Abs: 1.3 10*3/uL (ref 0.9–3.3)
MCH: 30.5 pg (ref 25.1–34.0)
MCHC: 29.9 g/dL — ABNORMAL LOW (ref 31.5–36.0)
MCV: 102.1 fL — ABNORMAL HIGH (ref 79.5–101.0)
Monocytes Absolute: 0.2 10*3/uL (ref 0.1–0.9)
Monocytes Relative: 9 %
Neutro Abs: 1.1 10*3/uL — ABNORMAL LOW (ref 1.5–6.5)
Neutrophils Relative %: 42 %
PLATELETS: 44 10*3/uL — AB (ref 145–400)
RBC: 2.92 MIL/uL — AB (ref 3.70–5.45)
RDW: 17.9 % — ABNORMAL HIGH (ref 11.2–14.5)
WBC: 2.7 10*3/uL — AB (ref 3.9–10.3)

## 2017-04-10 MED ORDER — DIPHENHYDRAMINE HCL 50 MG/ML IJ SOLN
50.0000 mg | Freq: Once | INTRAMUSCULAR | Status: AC
  Administered 2017-04-10: 50 mg via INTRAVENOUS

## 2017-04-10 MED ORDER — SODIUM CHLORIDE 0.9 % IV SOLN
570.0000 mg | Freq: Once | INTRAVENOUS | Status: AC
  Administered 2017-04-10: 570 mg via INTRAVENOUS
  Filled 2017-04-10: qty 57

## 2017-04-10 MED ORDER — FAMOTIDINE IN NACL 20-0.9 MG/50ML-% IV SOLN: 20.0000 mg | Freq: Two times a day (BID) | INTRAVENOUS | Status: DC

## 2017-04-10 MED ORDER — SODIUM CHLORIDE 0.9 % IV SOLN
Freq: Once | INTRAVENOUS | Status: AC
  Administered 2017-04-10: 10:00:00 via INTRAVENOUS

## 2017-04-10 MED ORDER — SODIUM CHLORIDE 0.9% FLUSH
10.0000 mL | INTRAVENOUS | Status: DC | PRN
  Administered 2017-04-10: 10 mL
  Filled 2017-04-10: qty 10

## 2017-04-10 MED ORDER — DIPHENHYDRAMINE HCL 50 MG/ML IJ SOLN
INTRAMUSCULAR | Status: AC
  Filled 2017-04-10: qty 1

## 2017-04-10 MED ORDER — DEXAMETHASONE SODIUM PHOSPHATE 10 MG/ML IJ SOLN
INTRAMUSCULAR | Status: AC
  Filled 2017-04-10: qty 1

## 2017-04-10 MED ORDER — SODIUM CHLORIDE 0.9 % IJ SOLN
10.0000 mL | INTRAMUSCULAR | Status: DC | PRN
  Administered 2017-04-10: 10 mL via INTRAVENOUS
  Filled 2017-04-10: qty 10

## 2017-04-10 MED ORDER — DEXAMETHASONE SODIUM PHOSPHATE 10 MG/ML IJ SOLN
4.0000 mg | Freq: Once | INTRAMUSCULAR | Status: AC
  Administered 2017-04-10: 4 mg via INTRAVENOUS

## 2017-04-10 MED ORDER — PALONOSETRON HCL INJECTION 0.25 MG/5ML
0.2500 mg | Freq: Once | INTRAVENOUS | Status: AC
  Administered 2017-04-10: 0.25 mg via INTRAVENOUS

## 2017-04-10 MED ORDER — PALONOSETRON HCL INJECTION 0.25 MG/5ML
INTRAVENOUS | Status: AC
  Filled 2017-04-10: qty 5

## 2017-04-10 MED ORDER — SODIUM CHLORIDE 0.9 % IV SOLN
20.0000 mg | Freq: Once | INTRAVENOUS | Status: AC
  Administered 2017-04-10: 20 mg via INTRAVENOUS
  Filled 2017-04-10: qty 2

## 2017-04-10 MED ORDER — HEPARIN SOD (PORK) LOCK FLUSH 100 UNIT/ML IV SOLN
500.0000 [IU] | Freq: Once | INTRAVENOUS | Status: AC | PRN
  Administered 2017-04-10: 500 [IU]
  Filled 2017-04-10: qty 5

## 2017-04-10 NOTE — Telephone Encounter (Signed)
No 2/20 los  °

## 2017-04-10 NOTE — Patient Instructions (Signed)
[facility] Discharge Instructions for Patients Receiving Chemotherapy  Today you received the following chemotherapy agents Carboplatin  To help prevent nausea and vomiting after your treatment, we encourage you to take your nausea medication as directed   If you develop nausea and vomiting that is not controlled by your nausea medication, call the clinic.   BELOW ARE SYMPTOMS THAT SHOULD BE REPORTED IMMEDIATELY:  *FEVER GREATER THAN 100.5 F  *CHILLS WITH OR WITHOUT FEVER  NAUSEA AND VOMITING THAT IS NOT CONTROLLED WITH YOUR NAUSEA MEDICATION  *UNUSUAL SHORTNESS OF BREATH  *UNUSUAL BRUISING OR BLEEDING  TENDERNESS IN MOUTH AND THROAT WITH OR WITHOUT PRESENCE OF ULCERS  *URINARY PROBLEMS  *BOWEL PROBLEMS  UNUSUAL RASH Items with * indicate a potential emergency and should be followed up as soon as possible.  Feel free to call the clinic should you have any questions or concerns. The clinic phone number is [phone].  Please show the CHEMO ALERT CARD at check-in to the Emergency Department and triage nurse.   

## 2017-04-10 NOTE — Telephone Encounter (Signed)
CT Abd/Pelvis with contrast and CT chest with contrast scheduled for Friday March 8th at 9:30am. Patient aware and will pick up contrast in Radiology after her infusion today. Patient made aware that she has to be NPO 4 hours prior to scan. Patient verbalized understanding.

## 2017-04-10 NOTE — Progress Notes (Signed)
[facility] OFFICE PROGRESS NOTE  [name], [name], MD [place] [address]  DIAGNOSIS: 62 year old woman with:  1. 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  The pathology suggests ovarian cancer etiology.  2. 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 The tumor is ER positive PR negative, HER-2 negative.  PRIOR THERAPY: Chemotherapy utilizing carboplatin and Taxotere started on 10/01/2012. Avastin was added with cycle 4. This was discontinued in June of 2015.  She is S/P Avastin maintenance only between June 2015 till March 2017. Therapy discontinued due to progression of disease.  She is status post Doxil salvage chemotherapy therapy discontinued because of hypersensitivity reaction in March 2017.  CURRENT THERAPY: Carboplatin and AUC of 5 every 3 weeks cycle 1 started on 07/08/2015 and had an excellent response since that time.  She is here for the next cycle of therapy.  INTERVAL HISTORY: [name] 62 y.o. female returns for routine follow-up visit accompanied by her husband.  The patient reports that she had some bright red blood per rectum with her bowel movements for about 2 weeks following her last chemotherapy.  This has now resolved.  She reports that she does have hemorrhoids.  She denies having any other bleeding.  She recently started using his CPAP machine reports that has improved her quality of sleep.  She tolerated her last cycle of chemotherapy fairly well. She denies any nausea, vomiting or worsening neuropathy.  She denies any abdominal pain or decline in her performance status.  Her quality of life remains about the same.  She does not report any headaches, blurry vision, syncope or seizures. She denied any fever, chills or sweats.  She does not report any chest pain, palpitation, orthopnea or leg edema.  Has not reported any shortness of  breath or cough. She denied any pain with swallowing. She does not report any vomiting, constipation. She denied any GYN bleeding. She reports no anxiety or depression.  She does not report any hematuria or dysuria.  She does report frequency and incontinence.  She does not report any heat or cold intolerance.  She does not report any arthralgias or myalgias.  She does not report any lymphadenopathy or petechiae.  Remainder of the review of systems is negative.   Patient is here for evaluation prior to her next cycle of chemotherapy.    MEDICAL HISTORY: Past Medical History:  Diagnosis Date  . ADD (attention deficit disorder)   . Allergy    seasonal  . Anxiety   . Bipolar disorder ([place])   . Breast cancer ([place]) 1989   right breast  . Cancer ([place])    Omentum  . Coronary artery disease   . Depression   . Hematochezia   . History of echocardiogram    a. Limited Echo 9/17: EF 60-65%, no RWMA, Gr 1 DD, trivial AI, trivial MR, GLS -12% (likely underestimated), no pericardial effusion  . Hyperlipidemia   . Hypothyroidism   . Maintenance chemotherapy    Pt has chemo every 3 weeks (on Friday)  . NSTEMI (non-ST elevated myocardial infarction) ([place]) 10/26/2015  . Osteopenia 03/2009   t score -2.1 FRAX 4.6/0.4  . Premature menopause   . Sleep apnea    mild  . Urinary incontinence     ALLERGIES:  is allergic to doxil [doxorubicin hcl liposomal] and pollen extract.  MEDICATIONS:  Current Outpatient Medications  Medication Sig Dispense  Refill  . acetaminophen (TYLENOL) 325 MG tablet Take 2 tablets (650 mg total) by mouth every 4 (four) hours as needed for mild pain, fever or headache. 30 tablet 0  . Acetylcarnitine HCl (ACETYL L-CARNITINE PO) Take 2,000 mg by mouth at bedtime.    . ALPRAZolam (XANAX) 0.5 MG tablet Take 0.5 mg by mouth 2 (two) times daily as needed for anxiety.    Marland Kitchen aspirin 81 MG chewable tablet Chew 1 tablet (81 mg total) by mouth daily. 30 tablet 0  . atorvastatin  (LIPITOR) 40 MG tablet Take 1 tablet (40 mg total) by mouth daily. 90 tablet 2  . carvedilol (COREG) 3.125 MG tablet Take 1/2 tablet by mouth twice daily with meals. 30 tablet 9  . chlorhexidine (PERIDEX) 0.12 % solution RINSE W/1 CAPFUL FOR 30 SEC & EXPECTORATE. REPEAT TWICE DAILY FOR 7-10 DAYS  0  . clotrimazole-betamethasone (LOTRISONE) cream Apply 1 application topically 2 (two) times daily. 30 Padilla 1  . divalproex (DEPAKOTE) 250 MG DR tablet Take by mouth. Take '500mg'$ s in the morning and '750mg'$ s at night    . fluticasone (FLONASE) 50 MCG/ACT nasal spray INHALE 2 SPRAYS INTO EACH NOSTRIL EVERY NIGHT  11  . gabapentin (NEURONTIN) 100 MG capsule Take 1 capsule by mouth twice daily. 180 capsule 3  . lamoTRIgine (LAMICTAL) 100 MG tablet Take 100 mg by mouth 2 (two) times daily.    Marland Kitchen levothyroxine (SYNTHROID, LEVOTHROID) 100 MCG tablet Take 1 tablet (100 mcg total) by mouth daily. 90 tablet 2  . lidocaine-prilocaine (EMLA) cream Apply topically as needed. Apply to port with every chemotherapy. 30 Padilla 0  . loperamide (IMODIUM A-D) 2 MG tablet Take 2 mg by mouth as needed for diarrhea or loose stools.    Marland Kitchen loratadine (CLARITIN) 10 MG tablet Take 10 mg by mouth daily as needed for allergies (with chemo).    . nitroGLYCERIN (NITROSTAT) 0.4 MG SL tablet Place 1 tablet (0.4 mg total) under the tongue every 5 (five) minutes as needed for chest pain. 25 tablet 3  . ondansetron (ZOFRAN ODT) 8 MG disintegrating tablet Take 1 tablet (8 mg total) by mouth every 8 (eight) hours as needed for nausea or vomiting. 20 tablet 1  . ondansetron (ZOFRAN) 8 MG tablet TAKE 1 TABLET BY MOUTH EVERY 8 HOURS AS NEEDED FOR NAUSEA AND VOMITING 30 tablet 2  . pantoprazole (PROTONIX) 40 MG tablet Take 1 tablet (40 mg total) by mouth daily. 90 tablet 1  . prochlorperazine (COMPAZINE) 10 MG tablet Take 1 tablet (10 mg total) by mouth every 6 (six) hours as needed for nausea or vomiting. 30 tablet 0  . rOPINIRole (REQUIP) 0.5 MG tablet Take  3 tablets by mouth at bedtime. 90 tablet 1  . sertraline (ZOLOFT) 50 MG tablet      No current facility-administered medications for this visit.    Facility-Administered Medications Ordered in Other Visits  Medication Dose Route Frequency Provider Last Rate Last Dose  . heparin lock flush 100 unit/mL  500 Units Intravenous Once Wyatt Portela, MD      . sodium chloride flush (NS) 0.9 % injection 10 mL  10 mL Intravenous PRN Wyatt Portela, MD        SURGICAL HISTORY:  Past Surgical History:  Procedure Laterality Date  . BREAST SURGERY  1989   RIGHT LUMPECTOMY, RADIATION AND CHEMO  . CARDIAC CATHETERIZATION N/A 10/26/2015   Procedure: Left Heart Cath and Coronary Angiography;  Surgeon: Wellington Hampshire, MD;  Location: [facility];  Service: Cardiovascular;  Laterality: N/A;  . CARDIAC CATHETERIZATION N/A 10/26/2015   Procedure: Coronary Stent Intervention;  Surgeon: Wellington Hampshire, MD;  Location: [facility];  Service: Cardiovascular;  Laterality: N/A;  . CARDIAC CATHETERIZATION N/A 11/08/2015   Procedure: Left Heart Cath and Coronary Angiography;  Surgeon: Jolaine Artist, MD;  Location: [facility];  Service: Cardiovascular;  Laterality: N/A;  . drug eluting stent  10/26/2015  . FOOT SURGERY  2013   BILATERAL   . HYSTEROSCOPY  2011   [place]  . PELVIC LAPAROSCOPY/ Hysteroscopy  1996    REVIEW OF SYSTEMS:   Review of Systems  Constitutional: Negative for appetite change, chills, fatigue, fever and unexpected weight change.  HENT:   Negative for mouth sores, nosebleeds, sore throat and trouble swallowing.   Eyes: Negative for eye problems and icterus.  Respiratory: Negative for cough, hemoptysis, shortness of breath and wheezing.   Cardiovascular: Negative for chest pain and leg swelling.  Gastrointestinal: Negative for abdominal pain, constipation, diarrhea, nausea and vomiting.  Genitourinary: Negative for bladder incontinence, difficulty urinating,  dysuria, frequency and hematuria.   Musculoskeletal: Negative for back pain, gait problem, neck pain and neck stiffness.  Skin: Negative for itching and rash.  Neurological: Negative for dizziness, extremity weakness, gait problem, headaches, light-headedness and seizures.  Hematological: Negative for adenopathy. Does not bruise/bleed easily.  Psychiatric/Behavioral: Negative for confusion, depression and sleep disturbance. The patient is not nervous/anxious.     PHYSICAL EXAMINATION:  Blood pressure 113/60, pulse 61, temperature 97.7 F (36.5 C), temperature source Oral, resp. rate 18, weight 153 lb 3.2 oz (69.5 kg), SpO2 100 %.  ECOG PERFORMANCE STATUS: 1 - Symptomatic but completely ambulatory  Physical Exam  Constitutional: Oriented to person, place, and time and well-developed, well-nourished, and in no distress. No distress.  HENT:  Head: Normocephalic and atraumatic.  Mouth/Throat: Oropharynx is clear and moist. No oropharyngeal exudate.  Eyes: Conjunctivae are normal. Right eye exhibits no discharge. Left eye exhibits no discharge. No scleral icterus.  Neck: Normal range of motion. Neck supple.  Cardiovascular: Normal rate, regular rhythm, normal heart sounds and intact distal pulses.   Pulmonary/Chest: Effort normal and breath sounds normal. No respiratory distress. No wheezes. No rales.  Abdominal: Soft. Bowel sounds are normal. Exhibits no distension and no mass. There is no tenderness.  Musculoskeletal: Normal range of motion. Exhibits no edema.  Lymphadenopathy:    No cervical adenopathy.  Neurological: Alert and oriented to person, place, and time. Exhibits normal muscle tone. Gait normal. Coordination normal.  Skin: Skin is warm and dry. No rash noted. Not diaphoretic. No erythema. No pallor.  Psychiatric: Mood, memory and judgment normal.  Vitals reviewed.  LABORATORY DATA: Lab Results  Component Value Date   WBC 2.7 (L) 04/10/2017   HGB 10.0 (L) 02/27/2017   HCT  29.8 (L) 04/10/2017   MCV 102.1 (H) 04/10/2017   PLT 44 (L) 04/10/2017      Chemistry      Component Value Date/Time   NA 142 03/20/2017 0907   NA 142 01/21/2017 0822   K 4.0 03/20/2017 0907   K 4.1 01/21/2017 0822   CL 107 03/20/2017 0907   CO2 28 03/20/2017 0907   CO2 27 01/21/2017 0822   BUN 12 03/20/2017 0907   BUN 12.2 01/21/2017 0822   CREATININE 0.67 03/20/2017 0907   CREATININE 0.7 01/21/2017 0822      Component Value Date/Time   CALCIUM 9.0  03/20/2017 0907   CALCIUM 8.9 01/21/2017 0822   ALKPHOS 76 03/20/2017 0907   ALKPHOS 75 01/21/2017 0822   AST 12 03/20/2017 0907   AST 12 01/21/2017 0822   ALT 5 03/20/2017 0907   ALT 7 01/21/2017 0822   BILITOT 0.4 03/20/2017 0907   BILITOT 0.23 01/21/2017 0822       RADIOGRAPHIC STUDIES:  No results found.   ASSESSMENT/PLAN:  62 year old woman with the following issues:  1. Peritoneal carcinomatosis with omental involvement arising from an ovarian primary.  She is status post multiple systemic therapy outlined above without any primary surgical therapy.   She is currently receiving salvage therapy with carboplatin single agent at AUC of 5 with excellent response since 2017.  CT scan obtained on 12/28/2016 was reviewed again and showed stable disease.  Her tumor markers have been slowly rising although could be related to her missing treatments in December.  Risks and benefits of continuing this chemotherapy was reviewed today and she is agreeable to continue.  This chemotherapy has been able to palliate her disease and keep her cancer under control for an extended period of time.  She will have repeat CT scan in March 2019..  2. IV access: No complications noted to her Port-A-Cath.  This will remain to be in use.  3. 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    She had rectal bleeding following her last cycle of chemotherapy that has now resolved.  No intervention is needed at this time.   Platelet count is 44,000 and this was reviewed with Dr Alen Blew who recommends that the patient proceed with her chemotherapy as scheduled today.  I have advised the patient to contact us if she develops any bleeding and we can check her CBC and see if a platelet transfusion is indicated.  4. Left breast cancer She status post biopsy with the pathology indicating T2 N1 invasive ductal carcinoma with the tumor have ER positive, PR negative HER-2 negative. Her Ki-67 is 50%.  Mammogram obtained on 03/20/2016 showed excellent response to systemic chemotherapy.  Primary surgical therapy was deferred because of her other malignancy which will dictate her overall survival.  Surgery can be used in the future if she develops any local recurrence.  5. Neutropenia: Related to chemotherapy without fever or infection.  Neulasta will be given after a cycle of therapy. ANC is 1.1 today and is adequate to proceed with treatment.  6. Nausea: Manageable with her current antiemetics at this time.  No recent vomiting or exacerbation.  7. Followup: The patient will have a restaging CT scan prior to her next visit.  She will follow-up in 3 weeks for chemotherapy infusion and to review her restaging CT scan results.     No orders of the defined types were placed in this encounter.   Mikey Bussing, DNP, AGPCNP-BC, AOCNP 04/10/17

## 2017-04-10 NOTE — Progress Notes (Signed)
Ok to treat per Erasmo Downer NP with PLT count today

## 2017-04-10 NOTE — Progress Notes (Signed)
Per Mikey Bussing and Dr. Alen Blew ok to treat with [place] of 1.1.

## 2017-04-10 NOTE — Patient Instructions (Signed)
[facility] Discharge Instructions for Patients Receiving Chemotherapy  Today you received the following chemotherapy agents: Carboplatin (Paraplatin)  To help prevent nausea and vomiting after your treatment, we encourage you to take your nausea medication as prescribed. If you develop nausea and vomiting that is not controlled by your nausea medication, call the clinic.   BELOW ARE SYMPTOMS THAT SHOULD BE REPORTED IMMEDIATELY:  *FEVER GREATER THAN 100.5 F  *CHILLS WITH OR WITHOUT FEVER  NAUSEA AND VOMITING THAT IS NOT CONTROLLED WITH YOUR NAUSEA MEDICATION  *UNUSUAL SHORTNESS OF BREATH  *UNUSUAL BRUISING OR BLEEDING  TENDERNESS IN MOUTH AND THROAT WITH OR WITHOUT PRESENCE OF ULCERS  *URINARY PROBLEMS  *BOWEL PROBLEMS  UNUSUAL RASH Items with * indicate a potential emergency and should be followed up as soon as possible.  Feel free to call the clinic should you have any questions or concerns. The clinic phone number is [phone].  Please show the CHEMO ALERT CARD at check-in to the Emergency Department and triage nurse.   

## 2017-04-11 ENCOUNTER — Inpatient Hospital Stay: Payer: PPO

## 2017-04-11 VITALS — BP 111/68 | HR 62 | Temp 98.1°F | Resp 18

## 2017-04-11 DIAGNOSIS — C482 Malignant neoplasm of peritoneum, unspecified: Secondary | ICD-10-CM

## 2017-04-11 DIAGNOSIS — Z5189 Encounter for other specified aftercare: Secondary | ICD-10-CM | POA: Diagnosis not present

## 2017-04-11 LAB — CA 125: Cancer Antigen (CA) 125: 39.3 U/mL — ABNORMAL HIGH (ref 0.0–38.1)

## 2017-04-11 MED ORDER — PEGFILGRASTIM INJECTION 6 MG/0.6ML ~~LOC~~
PREFILLED_SYRINGE | SUBCUTANEOUS | Status: AC
  Filled 2017-04-11: qty 0.6

## 2017-04-11 MED ORDER — PEGFILGRASTIM INJECTION 6 MG/0.6ML ~~LOC~~
6.0000 mg | PREFILLED_SYRINGE | Freq: Once | SUBCUTANEOUS | Status: AC
  Administered 2017-04-11: 6 mg via SUBCUTANEOUS

## 2017-04-17 ENCOUNTER — Other Ambulatory Visit: Payer: Self-pay | Admitting: Oncology

## 2017-04-17 ENCOUNTER — Other Ambulatory Visit: Payer: Self-pay | Admitting: Family Medicine

## 2017-04-17 MED ORDER — PEGFILGRASTIM INJECTION 6 MG/0.6ML ~~LOC~~
PREFILLED_SYRINGE | SUBCUTANEOUS | Status: AC
  Filled 2017-04-17: qty 0.6

## 2017-04-22 ENCOUNTER — Encounter (HOSPITAL_COMMUNITY): Payer: Self-pay

## 2017-04-22 ENCOUNTER — Ambulatory Visit (HOSPITAL_COMMUNITY)
Admission: RE | Admit: 2017-04-22 | Discharge: 2017-04-22 | Disposition: A | Discharge status: Home / Self Care | Payer: PPO | Source: Ambulatory Visit | Attending: Oncology | Admitting: Oncology

## 2017-04-22 DIAGNOSIS — R161 Splenomegaly, not elsewhere classified: Secondary | ICD-10-CM | POA: Diagnosis not present

## 2017-04-22 DIAGNOSIS — I700 Atherosclerosis of aorta: Secondary | ICD-10-CM | POA: Diagnosis not present

## 2017-04-22 DIAGNOSIS — R599 Enlarged lymph nodes, unspecified: Secondary | ICD-10-CM | POA: Diagnosis not present

## 2017-04-22 DIAGNOSIS — C786 Secondary malignant neoplasm of retroperitoneum and peritoneum: Secondary | ICD-10-CM | POA: Diagnosis not present

## 2017-04-22 DIAGNOSIS — C482 Malignant neoplasm of peritoneum, unspecified: Secondary | ICD-10-CM | POA: Diagnosis not present

## 2017-04-22 DIAGNOSIS — R590 Localized enlarged lymph nodes: Secondary | ICD-10-CM | POA: Insufficient documentation

## 2017-04-22 MED ORDER — IOPAMIDOL (ISOVUE-300) INJECTION 61%
INTRAVENOUS | Status: AC
  Administered 2017-04-22: 30 mL via ORAL
  Filled 2017-04-22: qty 30

## 2017-04-22 MED ORDER — IOPAMIDOL (ISOVUE-300) INJECTION 61%
100.0000 mL | Freq: Once | INTRAVENOUS | Status: AC | PRN
  Administered 2017-04-22: 100 mL via INTRAVENOUS

## 2017-04-22 MED ORDER — HEPARIN SOD (PORK) LOCK FLUSH 100 UNIT/ML IV SOLN: 500.0000 [IU] | Freq: Once | INTRAVENOUS | Status: DC

## 2017-04-22 MED ORDER — SODIUM CHLORIDE 0.9 % IJ SOLN
INTRAMUSCULAR | Status: AC
  Filled 2017-04-22: qty 50

## 2017-04-22 MED ORDER — HEPARIN SOD (PORK) LOCK FLUSH 100 UNIT/ML IV SOLN
INTRAVENOUS | Status: AC
  Filled 2017-04-22: qty 5

## 2017-04-22 MED ORDER — IOPAMIDOL (ISOVUE-300) INJECTION 61%
30.0000 mL | Freq: Once | INTRAVENOUS | Status: AC | PRN
  Administered 2017-04-22: 30 mL via ORAL

## 2017-04-22 MED ORDER — IOPAMIDOL (ISOVUE-300) INJECTION 61%
INTRAVENOUS | Status: AC
  Filled 2017-04-22: qty 100

## 2017-04-26 ENCOUNTER — Ambulatory Visit (HOSPITAL_COMMUNITY): Payer: PPO

## 2017-05-01 ENCOUNTER — Encounter: Payer: Self-pay | Admitting: Oncology

## 2017-05-01 ENCOUNTER — Inpatient Hospital Stay (HOSPITAL_BASED_OUTPATIENT_CLINIC_OR_DEPARTMENT_OTHER): Payer: PPO | Admitting: Oncology

## 2017-05-01 ENCOUNTER — Inpatient Hospital Stay: Payer: PPO

## 2017-05-01 ENCOUNTER — Telehealth: Payer: Self-pay

## 2017-05-01 ENCOUNTER — Ambulatory Visit: Payer: PPO | Admitting: Oncology

## 2017-05-01 ENCOUNTER — Inpatient Hospital Stay: Payer: PPO | Attending: Oncology

## 2017-05-01 VITALS — BP 131/79 | HR 61 | Temp 97.9°F | Resp 18 | Ht 61.0 in | Wt 153.8 lb

## 2017-05-01 DIAGNOSIS — R110 Nausea: Secondary | ICD-10-CM

## 2017-05-01 DIAGNOSIS — Z853 Personal history of malignant neoplasm of breast: Secondary | ICD-10-CM

## 2017-05-01 DIAGNOSIS — Z170 Estrogen receptor positive status [ER+]: Secondary | ICD-10-CM

## 2017-05-01 DIAGNOSIS — C482 Malignant neoplasm of peritoneum, unspecified: Secondary | ICD-10-CM

## 2017-05-01 DIAGNOSIS — D701 Agranulocytosis secondary to cancer chemotherapy: Secondary | ICD-10-CM | POA: Diagnosis not present

## 2017-05-01 DIAGNOSIS — Z5189 Encounter for other specified aftercare: Secondary | ICD-10-CM | POA: Insufficient documentation

## 2017-05-01 DIAGNOSIS — Z79899 Other long term (current) drug therapy: Secondary | ICD-10-CM

## 2017-05-01 DIAGNOSIS — G4733 Obstructive sleep apnea (adult) (pediatric): Secondary | ICD-10-CM | POA: Diagnosis not present

## 2017-05-01 DIAGNOSIS — C50412 Malignant neoplasm of upper-outer quadrant of left female breast: Secondary | ICD-10-CM

## 2017-05-01 DIAGNOSIS — Z5111 Encounter for antineoplastic chemotherapy: Secondary | ICD-10-CM | POA: Insufficient documentation

## 2017-05-01 DIAGNOSIS — Z95828 Presence of other vascular implants and grafts: Secondary | ICD-10-CM

## 2017-05-01 DIAGNOSIS — D696 Thrombocytopenia, unspecified: Secondary | ICD-10-CM | POA: Insufficient documentation

## 2017-05-01 DIAGNOSIS — Z923 Personal history of irradiation: Secondary | ICD-10-CM | POA: Diagnosis not present

## 2017-05-01 DIAGNOSIS — C50919 Malignant neoplasm of unspecified site of unspecified female breast: Secondary | ICD-10-CM

## 2017-05-01 LAB — CMP (CANCER CENTER ONLY)
ALK PHOS: 78 U/L (ref 40–150)
ALT: 6 U/L (ref 0–55)
ANION GAP: 6 (ref 3–11)
AST: 12 U/L (ref 5–34)
Albumin: 3.3 g/dL — ABNORMAL LOW (ref 3.5–5.0)
BILIRUBIN TOTAL: 0.3 mg/dL (ref 0.2–1.2)
BUN: 11 mg/dL (ref 7–26)
CALCIUM: 9.1 mg/dL (ref 8.4–10.4)
CO2: 28 mmol/L (ref 22–29)
CREATININE: 0.65 mg/dL (ref 0.60–1.10)
Chloride: 108 mmol/L (ref 98–109)
GFR, Estimated: >60 mL/min (ref >60)
Glucose, Bld: 88 mg/dL (ref 70–140)
Potassium: 4 mmol/L (ref 3.5–5.1)
Sodium: 142 mmol/L (ref 136–145)
TOTAL PROTEIN: 7.2 g/dL (ref 6.4–8.3)

## 2017-05-01 LAB — CBC WITH DIFFERENTIAL (CANCER CENTER ONLY)
BASOS ABS: 0 10*3/uL (ref 0.0–0.1)
BASOS PCT: 1 %
EOS ABS: 0 10*3/uL (ref 0.0–0.5)
Eosinophils Relative: 1 %
HEMATOCRIT: 29 % — AB (ref 34.8–46.6)
HEMOGLOBIN: 9.1 g/dL — AB (ref 11.6–15.9)
Lymphocytes Relative: 40 %
Lymphs Abs: 1 10*3/uL (ref 0.9–3.3)
MCH: 31.1 pg (ref 25.1–34.0)
MCHC: 31.4 g/dL — ABNORMAL LOW (ref 31.5–36.0)
MCV: 99.2 fL (ref 79.5–101.0)
MONO ABS: 0.3 10*3/uL (ref 0.1–0.9)
Monocytes Relative: 11 %
NEUTROS ABS: 1.1 10*3/uL — AB (ref 1.5–6.5)
NEUTROS PCT: 47 %
Platelet Count: 55 10*3/uL — ABNORMAL LOW (ref 145–400)
RBC: 2.92 MIL/uL — ABNORMAL LOW (ref 3.70–5.45)
RDW: 20.9 % — ABNORMAL HIGH (ref 11.2–14.5)
WBC Count: 2.4 10*3/uL — ABNORMAL LOW (ref 3.9–10.3)

## 2017-05-01 MED ORDER — SODIUM CHLORIDE 0.9 % IJ SOLN
10.0000 mL | INTRAMUSCULAR | Status: DC | PRN
  Administered 2017-05-01: 10 mL via INTRAVENOUS
  Filled 2017-05-01: qty 10

## 2017-05-01 MED ORDER — SODIUM CHLORIDE 0.9% FLUSH
10.0000 mL | INTRAVENOUS | Status: DC | PRN
  Administered 2017-05-01: 10 mL
  Filled 2017-05-01: qty 10

## 2017-05-01 MED ORDER — DIPHENHYDRAMINE HCL 50 MG/ML IJ SOLN
50.0000 mg | Freq: Once | INTRAMUSCULAR | Status: AC
  Administered 2017-05-01: 50 mg via INTRAVENOUS

## 2017-05-01 MED ORDER — DEXAMETHASONE SODIUM PHOSPHATE 10 MG/ML IJ SOLN
INTRAMUSCULAR | Status: AC
  Filled 2017-05-01: qty 1

## 2017-05-01 MED ORDER — FAMOTIDINE 200 MG/20ML IV SOLN
20.0000 mg | Freq: Once | INTRAVENOUS | Status: AC
  Administered 2017-05-01: 20 mg via INTRAVENOUS
  Filled 2017-05-01: qty 2

## 2017-05-01 MED ORDER — PALONOSETRON HCL INJECTION 0.25 MG/5ML
INTRAVENOUS | Status: AC
  Filled 2017-05-01: qty 5

## 2017-05-01 MED ORDER — DEXAMETHASONE SODIUM PHOSPHATE 10 MG/ML IJ SOLN
4.0000 mg | Freq: Once | INTRAMUSCULAR | Status: AC
  Administered 2017-05-01: 4 mg via INTRAVENOUS

## 2017-05-01 MED ORDER — FAMOTIDINE IN NACL 20-0.9 MG/50ML-% IV SOLN: 20.0000 mg | Freq: Two times a day (BID) | INTRAVENOUS | Status: DC

## 2017-05-01 MED ORDER — DIPHENHYDRAMINE HCL 50 MG/ML IJ SOLN
INTRAMUSCULAR | Status: AC
  Filled 2017-05-01: qty 1

## 2017-05-01 MED ORDER — HEPARIN SOD (PORK) LOCK FLUSH 100 UNIT/ML IV SOLN
500.0000 [IU] | Freq: Once | INTRAVENOUS | Status: AC | PRN
  Administered 2017-05-01: 500 [IU]
  Filled 2017-05-01: qty 5

## 2017-05-01 MED ORDER — SODIUM CHLORIDE 0.9 % IV SOLN
Freq: Once | INTRAVENOUS | Status: AC
  Administered 2017-05-01: 11:00:00 via INTRAVENOUS

## 2017-05-01 MED ORDER — SODIUM CHLORIDE 0.9 % IV SOLN
570.0000 mg | Freq: Once | INTRAVENOUS | Status: AC
  Administered 2017-05-01: 570 mg via INTRAVENOUS
  Filled 2017-05-01: qty 57

## 2017-05-01 MED ORDER — PALONOSETRON HCL INJECTION 0.25 MG/5ML
0.2500 mg | Freq: Once | INTRAVENOUS | Status: AC
  Administered 2017-05-01: 0.25 mg via INTRAVENOUS

## 2017-05-01 NOTE — Progress Notes (Signed)
Per Patriciaann Clan NP ok to tx today with platelets 55 and ANC 1.1.

## 2017-05-01 NOTE — Telephone Encounter (Signed)
Completed the 3/13 los Alli adding inf on 4/3. Patient has Mychart. per 3/13 los

## 2017-05-01 NOTE — Progress Notes (Signed)
[facility] OFFICE PROGRESS NOTE  [name], [name], MD [place] [address]  DIAGNOSIS: 62 year old woman with:  1. 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  2. 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 The tumor is ER positive PR negative, HER-2 negative.  PRIOR THERAPY: Chemotherapy utilizing carboplatin and Taxotere started on 10/01/2012. Avastin was added with cycle 4. This was discontinued in June of 2015.  She is S/P Avastin maintenance only between June 2015 till March 2017. Therapy discontinued due to progression of disease.  She is status post Doxil salvage chemotherapy therapy discontinued because of hypersensitivity reaction in March 2017.  CURRENT THERAPY: Carboplatin and AUC of 5 every 3 weeks cycle 1 started on 07/08/2015 and had an excellent response since that time. She is here for the next cycle of therapy.  INTERVAL HISTORY: ROSANGELICA PEVEHOUSE 62 y.o. female returns for follow-up visit accompanied by her husband.  The patient reports that she is feeling well with the exception of some tenderness around her Port-A-Cath site.  She denies redness or swelling at the site.  Pain is intermittent.  She has had no difficulty with blood draws or chemotherapy administration through the Port-A-Cath.  She denies fevers and chills.  Denies chest pain, shortness of breath, cough, hemoptysis.  Denies nausea, vomiting, constipation, diarrhea.  Denies recent weight loss or night sweats.  The patient denies any bleeding.  She continues to tolerate her chemotherapy fairly well.  Patient's quality of life remains about the same.  She denies headaches, blurred vision, syncope, seizures.  Denies orthopnea and leg edema.  Denies any GYN bleeding.  Denies anxiety and depression.  Denies hematuria or dysuria.  Denies  arthralgias or myalgias.  Denies lymphadenopathy and petechiae area remainder of the review of systems is negative.  The patient is here for evaluation prior to her next cycle of chemotherapy and to review her restaging CT scan results.  MEDICAL HISTORY: Past Medical History:  Diagnosis Date  . ADD (attention deficit disorder)   . Allergy    seasonal  . Anxiety   . Bipolar disorder ([place])   . Breast cancer ([place] [facility]) 1989   right breast  . Cancer ([facility])    Omentum  . Coronary artery disease   . Depression   . Hematochezia   . History of echocardiogram    a. Limited Echo 9/17: EF 60-65%, no RWMA, Gr 1 DD, trivial AI, trivial MR, GLS -12% (likely underestimated), no pericardial effusion  . Hyperlipidemia   . Hypothyroidism   . Maintenance chemotherapy    Pt has chemo every 3 weeks (on Friday)  . NSTEMI (non-ST elevated myocardial infarction) ([facility]) 10/26/2015  . Osteopenia 03/2009   t score -2.1 FRAX 4.6/0.4  . Premature menopause   . Sleep apnea    mild  . Urinary incontinence     ALLERGIES:  is allergic to doxil [doxorubicin hcl liposomal] and pollen extract.  MEDICATIONS:  Current Outpatient Medications  Medication Sig Dispense Refill  . acetaminophen (TYLENOL) 325 MG tablet Take 2 tablets (650 mg total) by mouth every 4 (four) hours as needed for mild pain, fever or headache. 30 tablet 0  . Acetylcarnitine HCl (ACETYL L-CARNITINE PO) Take 2,000 mg by mouth at bedtime.    . ALPRAZolam (XANAX) 0.5 MG tablet Take 0.5 mg by mouth 2 (two) times daily as needed for anxiety.    Marland Kitchen  aspirin 81 MG chewable tablet Chew 1 tablet (81 mg total) by mouth daily. 30 tablet 0  . atorvastatin (LIPITOR) 40 MG tablet Take 1 tablet (40 mg total) by mouth daily. 90 tablet 2  . carvedilol (COREG) 3.125 MG tablet Take 1/2 tablet by mouth twice daily with meals. 30 tablet 9  . clotrimazole-betamethasone (LOTRISONE) cream Apply 1 application topically 2 (two) times daily. 30 g 1  . divalproex (DEPAKOTE)  250 MG DR tablet Take by mouth. Take '500mg'$ s in the morning and '750mg'$ s at night    . fluticasone (FLONASE) 50 MCG/ACT nasal spray INHALE 2 SPRAYS INTO EACH NOSTRIL EVERY NIGHT  11  . gabapentin (NEURONTIN) 100 MG capsule Take 1 capsule by mouth twice daily. 180 capsule 2  . lamoTRIgine (LAMICTAL) 100 MG tablet Take 100 mg by mouth 2 (two) times daily.    Marland Kitchen levothyroxine (SYNTHROID, LEVOTHROID) 100 MCG tablet Take 1 tablet (100 mcg total) by mouth daily. 90 tablet 2  . lidocaine-prilocaine (EMLA) cream Apply topically as needed. Apply to port with every chemotherapy. 30 g 0  . loperamide (IMODIUM A-D) 2 MG tablet Take 2 mg by mouth as needed for diarrhea or loose stools.    Marland Kitchen loratadine (CLARITIN) 10 MG tablet Take 10 mg by mouth daily as needed for allergies (with chemo).    . nitroGLYCERIN (NITROSTAT) 0.4 MG SL tablet Place 1 tablet (0.4 mg total) under the tongue every 5 (five) minutes as needed for chest pain. 25 tablet 3  . ondansetron (ZOFRAN ODT) 8 MG disintegrating tablet Take 1 tablet (8 mg total) by mouth every 8 (eight) hours as needed for nausea or vomiting. 20 tablet 1  . ondansetron (ZOFRAN) 8 MG tablet TAKE 1 TABLET BY MOUTH EVERY 8 HOURS AS NEEDED FOR NAUSEA AND VOMITING 30 tablet 2  . pantoprazole (PROTONIX) 40 MG tablet Take 1 tablet (40 mg total) by mouth daily. 90 tablet 1  . prochlorperazine (COMPAZINE) 10 MG tablet Take 1 tablet (10 mg total) by mouth every 6 (six) hours as needed for nausea or vomiting. 30 tablet 0  . rOPINIRole (REQUIP) 0.5 MG tablet Take 3 tablets by mouth at bedtime. 90 tablet 1  . sertraline (ZOLOFT) 50 MG tablet      No current facility-administered medications for this visit.    Facility-Administered Medications Ordered in Other Visits  Medication Dose Route Frequency Provider Last Rate Last Dose  . CARBOplatin (PARAPLATIN) 570 mg in sodium chloride 0.9 % 250 mL chemo infusion  570 mg Intravenous Once Shadad, Firas N, MD      . heparin lock flush 100  unit/mL  500 Units Intravenous Once Wyatt Portela, MD      . heparin lock flush 100 unit/mL  500 Units Intracatheter Once PRN Wyatt Portela, MD      . sodium chloride flush (NS) 0.9 % injection 10 mL  10 mL Intravenous PRN Wyatt Portela, MD      . sodium chloride flush (NS) 0.9 % injection 10 mL  10 mL Intracatheter PRN Wyatt Portela, MD        SURGICAL HISTORY:  Past Surgical History:  Procedure Laterality Date  . BREAST SURGERY  1989   RIGHT LUMPECTOMY, RADIATION AND CHEMO  . CARDIAC CATHETERIZATION N/A 10/26/2015   Procedure: Left Heart Cath and Coronary Angiography;  Surgeon: Wellington Hampshire, MD;  Location: [facility];  Service: Cardiovascular;  Laterality: N/A;  . CARDIAC CATHETERIZATION N/A 10/26/2015   Procedure: Coronary  Stent Intervention;  Surgeon: Wellington Hampshire, MD;  Location: [facility];  Service: Cardiovascular;  Laterality: N/A;  . CARDIAC CATHETERIZATION N/A 11/08/2015   Procedure: Left Heart Cath and Coronary Angiography;  Surgeon: Jolaine Artist, MD;  Location: [facility];  Service: Cardiovascular;  Laterality: N/A;  . drug eluting stent  10/26/2015  . FOOT SURGERY  2013   BILATERAL   . HYSTEROSCOPY  2011   [place]  . PELVIC LAPAROSCOPY/ Hysteroscopy  1996    REVIEW OF SYSTEMS:   Review of Systems  Constitutional: Negative for appetite change, chills, fever and unexpected weight change. Positive for mild fatigue. HENT:   Negative for mouth sores, nosebleeds, sore throat and trouble swallowing.   Eyes: Negative for eye problems and icterus.  Respiratory: Negative for cough, hemoptysis, shortness of breath and wheezing.   Cardiovascular: Negative for chest pain and leg swelling.  Gastrointestinal: Negative for abdominal pain, constipation, diarrhea, nausea and vomiting.  Genitourinary: Negative for bladder incontinence, difficulty urinating, dysuria, frequency and hematuria.   Musculoskeletal: Negative for back pain, gait problem, neck  pain and neck stiffness.  Skin: Negative for itching and rash.  Neurological: Negative for dizziness, extremity weakness, gait problem, headaches, light-headedness and seizures.  Hematological: Negative for adenopathy. Does not bruise/bleed easily.  Psychiatric/Behavioral: Negative for confusion, depression and sleep disturbance. The patient is not nervous/anxious.     PHYSICAL EXAMINATION:  Blood pressure 131/79, pulse 61, temperature 97.9 F (36.6 C), temperature source Oral, resp. rate 18, height '5\' 1"'$  (1.549 m), weight 153 lb 12.8 oz (69.8 kg), SpO2 100 %.  ECOG PERFORMANCE STATUS: 1 - Symptomatic but completely ambulatory  Physical Exam  Constitutional: Oriented to person, place, and time and well-developed, well-nourished, and in no distress. No distress.  HENT:  Head: Normocephalic and atraumatic.  Mouth/Throat: Oropharynx is clear and moist. No oropharyngeal exudate.  Eyes: Conjunctivae are normal. Right eye exhibits no discharge. Left eye exhibits no discharge. No scleral icterus.  Neck: Normal range of motion. Neck supple.  Cardiovascular: Normal rate, regular rhythm, normal heart sounds and intact distal pulses.   Pulmonary/Chest: Effort normal and breath sounds normal. No respiratory distress. No wheezes. No rales. Port-A-Cath without redness or edema. Abdominal: Soft. Bowel sounds are normal. Exhibits no distension and no mass. There is no tenderness.  Musculoskeletal: Normal range of motion. Exhibits no edema.  Lymphadenopathy:    No cervical adenopathy.  Neurological: Alert and oriented to person, place, and time. Exhibits normal muscle tone. Gait normal. Coordination normal.  Skin: Skin is warm and dry. No rash noted. Not diaphoretic. No erythema. No pallor.  Psychiatric: Mood, memory and judgment normal.  Vitals reviewed.  LABORATORY DATA: Lab Results  Component Value Date   WBC 2.4 (L) 05/01/2017   HGB 10.0 (L) 02/27/2017   HCT 29.0 (L) 05/01/2017   MCV 99.2  05/01/2017   PLT 55 (L) 05/01/2017      Chemistry      Component Value Date/Time   NA 142 05/01/2017 0916   NA 142 01/21/2017 0822   K 4.0 05/01/2017 0916   K 4.1 01/21/2017 0822   CL 108 05/01/2017 0916   CO2 28 05/01/2017 0916   CO2 27 01/21/2017 0822   BUN 11 05/01/2017 0916   BUN 12.2 01/21/2017 0822   CREATININE 0.65 05/01/2017 0916   CREATININE 0.7 01/21/2017 0822      Component Value Date/Time   CALCIUM 9.1 05/01/2017 0916   CALCIUM 8.9 01/21/2017 0822   ALKPHOS  78 05/01/2017 0916   ALKPHOS 75 01/21/2017 0822   AST 12 05/01/2017 0916   AST 12 01/21/2017 0822   ALT 6 05/01/2017 0916   ALT 7 01/21/2017 0822   BILITOT 0.3 05/01/2017 0916   BILITOT 0.23 01/21/2017 0822     Results for VANELLOPE, PASSMORE (MRN [id number]) as of 05/01/2017 11:34  Ref. Range 12/31/2016 08:49 01/21/2017 08:21 02/27/2017 09:05 03/20/2017 09:07 04/10/2017 08:15  Cancer Antigen (CA) 125 Latest Ref Range: 0.0 - 38.1 U/mL 25.2 31.5 33.8 31.2 39.3 (H)    RADIOGRAPHIC STUDIES:  Ct Chest W Contrast  Result Date: 04/22/2017 CLINICAL DATA:  Peritoneal carcinomatosis diagnosed in 2014 with chemotherapy ongoing. Primary ovarian cancer. History of right breast cancer in 1989. EXAM: CT CHEST, ABDOMEN, AND PELVIS WITH CONTRAST TECHNIQUE: Multidetector CT imaging of the chest, abdomen and pelvis was performed following the standard protocol during bolus administration of intravenous contrast. CONTRAST:  100 cc Omnipaque 300. COMPARISON:  CT 12/28/2016 and 06/25/2016. FINDINGS: CT CHEST FINDINGS Cardiovascular: Atherosclerosis of the aorta, great vessels and coronary arteries. No acute vascular findings are evident. Right IJ Port-A-Cath tip extends to the superior cavoatrial junction. The heart size is normal. There is no pericardial effusion. Mediastinum/Nodes: There are no enlarged mediastinal, hilar or axillary lymph nodes. The postsurgical changes status post bilateral axillary dissection. There is a stable small  varix in the left axilla (image 16/2). The thyroid gland, esophagus and trachea demonstrate no significant findings. Lungs/Pleura: There is no pleural effusion. The lungs are clear. Musculoskeletal/Chest wall: No chest wall mass or suspicious osseous findings. CT ABDOMEN AND PELVIS FINDINGS Hepatobiliary: Stable mild contour irregularity the liver without focal lesion or abnormal enhancement. No evidence of gallstones, gallbladder wall thickening or biliary dilatation. Pancreas: Unremarkable. No pancreatic ductal dilatation or surrounding inflammatory changes. Spleen: The spleen measures 13.0 x 8.7 x 8.9 cm (volume = 530 cm^3). No focal abnormality. Adrenals/Urinary Tract: Both adrenal glands appear normal. Stable small low-density lesion in the lower pole the right kidney, consistent with a cyst. No evidence of urinary tract calculus or hydronephrosis. The bladder appears unremarkable. Stomach/Bowel: No evidence of bowel wall thickening, distention or surrounding inflammatory change. Vascular/Lymphatic: Several mildly enlarged retroperitoneal lymph nodes are similar to the most recent study, including a 10 mm aortocaval node on image 75/2 and a 10 mm right common iliac node on image 82/2. Right pelvic sidewall nodal mass has enlarged, measuring 2.8 x 2.3 cm on image 93/2. No acute vascular findings. Mild aortic and branch vessel atherosclerosis. Reproductive: The uterus and adnexa appear stable. Other: Multifocal omental disease appears about the same. Dominant anterior omental lesion measures 10.1 x 3.4 cm on coronal image 43/5. There is a 3.4 cm lesion adjacent to the hepatic flexure of the colon on image 72/2. No significant ascites. Musculoskeletal: No acute or significant osseous findings. IMPRESSION: 1. Stable chest CT without evidence of metastatic disease. 2. Right pelvic sidewall lymph node has mildly enlarged, consistent with progressive metastatic disease. Retroperitoneal lymphadenopathy and peritoneal  carcinomatosis have not significantly changed. No ascites or solid organ metastases. 3. Mild splenomegaly. 4.  Aortic Atherosclerosis (ICD10-I70.0). Electronically Signed   By: Richardean Sale M.D.   On: 04/22/2017 16:17   Ct Abdomen Pelvis W Contrast  Result Date: 04/22/2017 CLINICAL DATA:  Peritoneal carcinomatosis diagnosed in 2014 with chemotherapy ongoing. Primary ovarian cancer. History of right breast cancer in 1989. EXAM: CT CHEST, ABDOMEN, AND PELVIS WITH CONTRAST TECHNIQUE: Multidetector CT imaging of the chest, abdomen and pelvis was performed following  the standard protocol during bolus administration of intravenous contrast. CONTRAST:  100 cc Omnipaque 300. COMPARISON:  CT 12/28/2016 and 06/25/2016. FINDINGS: CT CHEST FINDINGS Cardiovascular: Atherosclerosis of the aorta, great vessels and coronary arteries. No acute vascular findings are evident. Right IJ Port-A-Cath tip extends to the superior cavoatrial junction. The heart size is normal. There is no pericardial effusion. Mediastinum/Nodes: There are no enlarged mediastinal, hilar or axillary lymph nodes. The postsurgical changes status post bilateral axillary dissection. There is a stable small varix in the left axilla (image 16/2). The thyroid gland, esophagus and trachea demonstrate no significant findings. Lungs/Pleura: There is no pleural effusion. The lungs are clear. Musculoskeletal/Chest wall: No chest wall mass or suspicious osseous findings. CT ABDOMEN AND PELVIS FINDINGS Hepatobiliary: Stable mild contour irregularity the liver without focal lesion or abnormal enhancement. No evidence of gallstones, gallbladder wall thickening or biliary dilatation. Pancreas: Unremarkable. No pancreatic ductal dilatation or surrounding inflammatory changes. Spleen: The spleen measures 13.0 x 8.7 x 8.9 cm (volume = 530 cm^3). No focal abnormality. Adrenals/Urinary Tract: Both adrenal glands appear normal. Stable small low-density lesion in the lower pole  the right kidney, consistent with a cyst. No evidence of urinary tract calculus or hydronephrosis. The bladder appears unremarkable. Stomach/Bowel: No evidence of bowel wall thickening, distention or surrounding inflammatory change. Vascular/Lymphatic: Several mildly enlarged retroperitoneal lymph nodes are similar to the most recent study, including a 10 mm aortocaval node on image 75/2 and a 10 mm right common iliac node on image 82/2. Right pelvic sidewall nodal mass has enlarged, measuring 2.8 x 2.3 cm on image 93/2. No acute vascular findings. Mild aortic and branch vessel atherosclerosis. Reproductive: The uterus and adnexa appear stable. Other: Multifocal omental disease appears about the same. Dominant anterior omental lesion measures 10.1 x 3.4 cm on coronal image 43/5. There is a 3.4 cm lesion adjacent to the hepatic flexure of the colon on image 72/2. No significant ascites. Musculoskeletal: No acute or significant osseous findings. IMPRESSION: 1. Stable chest CT without evidence of metastatic disease. 2. Right pelvic sidewall lymph node has mildly enlarged, consistent with progressive metastatic disease. Retroperitoneal lymphadenopathy and peritoneal carcinomatosis have not significantly changed. No ascites or solid organ metastases. 3. Mild splenomegaly. 4.  Aortic Atherosclerosis (ICD10-I70.0). Electronically Signed   By: Richardean Sale M.D.   On: 04/22/2017 16:17     ASSESSMENT/PLAN:   62 year old woman with the following issues:  1. Peritoneal carcinomatosis with omental involvement arising from an ovarian primary.  She is status post multiple systemic therapy outlined above without any primary surgical therapy.   She is currently receiving salvage therapy with carboplatin single agent at AUC of 5 with excellent response since 2017.  She had a recent restaging CT scan of the chest, abdomen, and pelvis.  The patient was seen and examined with Dr Alen Blew.  CT scan results were reviewed  with the patient and her husband which showed overall stable disease.  There is a slight increase in the right pelvic sidewall lymph node, but this enlargement is not enough to change treatment.  Tumor marker has been stable in the 30s over the past 3 months.  CA 125 is pending today.  Recommend that she continue on her current treatment.  Risks and benefits of continuing this chemotherapy was reviewed today and she is agreeable to continue.  This chemotherapy has been able to palliate her disease and keep her cancer under control for an extended period of time.    2. IV access:  Mild  pain to her Port-A-Cath without evidence of infection.    Port-A-Cath infuses without difficulty and no difficulty obtaining blood from the Port-A-Cath.  This will remain in use.  3. 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  No active bleeding noted at this time.  No intervention is needed.  Platelet count is adequate to proceed with treatment as scheduled today.  4. Left breast cancer She status post biopsy with the pathology indicating T2 N1 invasive ductal carcinoma with the tumor have ER positive, PR negative HER-2 negative. Her Ki-67 is 50%.  Mammogram obtained on 03/20/2016 showed excellent response to systemic chemotherapy.  Primary surgical therapy was deferred because of her other malignancy which will dictate her overall survival.  Surgery can be used in the future if she develops any local recurrence.  5. Neutropenia: Related to chemotherapy without fever or infection.  Neulasta will be given after a cycle of therapy. ANC is adequate to proceed with treatment as scheduled today.  6. Nausea: Manageable with her current antiemetics at this time.  No recent vomiting or exacerbation.  7. Followup: She will follow-up in 3 weeks and 6 weeks for labs, follow-up visit, and her chemotherapy.  No orders of the defined types were placed in this encounter.   Mikey Bussing, DNP, AGPCNP-BC,  AOCNP 05/01/17  Patient seen and examined personally today. She reports no major changes in her health and have tolerated chemotherapy reasonably well. She denied any bleeding complications or effusion related issues.  Her physical examination showed an awake, alert woman without distress. Her Port-A-Cath site appeared dry without any erythema or induration. Her skin showed no petechiae. Her heart was regular rate and rhythm lungs are clear.  Laboratory data and CT scan were personally reviewed and discussed with the patient's face-to-face today. The results indicate overall stable disease and no indication to change treatment.  Risks and benefits of continuing this chemotherapy was discussed today in detail. Long-term complications associated with this medication was discussed and she is agreeable to continue. I recommended continuing the dose and schedule for the time being without any change. We will repeat imaging studies in 3-6 months.  Zola Button MD 05/01/17

## 2017-05-01 NOTE — Patient Instructions (Addendum)
[facility] Discharge Instructions for Patients Receiving Chemotherapy  Today you received the following chemotherapy agents Carboplatin  To help prevent nausea and vomiting after your treatment, we encourage you to take your nausea medication as directed  If you develop nausea and vomiting that is not controlled by your nausea medication, call the clinic.   BELOW ARE SYMPTOMS THAT SHOULD BE REPORTED IMMEDIATELY:  *FEVER GREATER THAN 100.5 F  *CHILLS WITH OR WITHOUT FEVER  NAUSEA AND VOMITING THAT IS NOT CONTROLLED WITH YOUR NAUSEA MEDICATION  *UNUSUAL SHORTNESS OF BREATH  *UNUSUAL BRUISING OR BLEEDING  TENDERNESS IN MOUTH AND THROAT WITH OR WITHOUT PRESENCE OF ULCERS  *URINARY PROBLEMS  *BOWEL PROBLEMS  UNUSUAL RASH Items with * indicate a potential emergency and should be followed up as soon as possible.  Feel free to call the clinic should you have any questions or concerns. The clinic phone number is [phone].  Please show the [facility] at check-in to the Emergency Department and triage nurse.   Neutropenia Neutropenia is a condition that occurs when you have a lower-than-normal level of a type of white blood cell (neutrophil) in your body. Neutrophils are made in the spongy center of large bones (bone marrow) and they fight infections. Neutrophils are your body's main defense against bacterial and fungal infections. The fewer neutrophils you have and the longer your body remains without them, the greater your risk of getting a severe infection. What are the causes? This condition can occur if your body uses up or destroys neutrophils faster than your bone marrow can make them. This problem may happen because of:  Bacterial or fungal infection.  Allergic disorders.  Reactions to some medicines.  Autoimmune disease.  An enlarged spleen.  This condition can also occur if your bone marrow does not produce enough neutrophils. This problem  may be caused by:  Cancer.  Cancer treatments, such as radiation or chemotherapy.  Viral infections.  Medicines, such as phenytoin.  Vitamin B12 deficiency.  Diseases of the bone marrow.  Environmental toxins, such as insecticides.  What are the signs or symptoms? This condition does not usually cause symptoms. If symptoms are present, they are usually caused by an underlying infection. Symptoms of an infection may include:  Fever.  Chills.  Swollen glands.  Oral or anal ulcers.  Cough and shortness of breath.  Rash.  Skin infection.  Fatigue.  How is this diagnosed? Your health care provider may suspect neutropenia if you have:  A condition that may cause neutropenia.  Symptoms of infection, especially fever.  Frequent and unusual infections.  You will have a medical history and physical exam. Tests will also be done, such as:  A complete blood count (CBC).  A procedure to collect a sample of bone marrow for examination (bone marrow biopsy).  A chest X-ray.  A urine culture.  A blood culture.  How is this treated? Treatment depends on the underlying cause and severity of your condition. Mild neutropenia may not require treatment. Treatment may include medicines, such as:  Antibiotic medicine given through an IV tube.  Antiviral medicines.  Antifungal medicines.  A medicine to increase neutrophil production (colony-stimulating factor). You may get this drug through an IV tube or by injection.  Steroids given through an IV tube.  If an underlying condition is causing neutropenia, you may need treatment for that condition. If medicines you are taking are causing neutropenia, your health care provider may have you stop taking those medicines.  Follow these instructions at home: Medicines  Take over-the-counter and prescription medicines only as told by your health care provider.  Get a seasonal flu shot (influenza vaccine). Lifestyle  Do not  eat unpasteurized foods.Do not eat unwashed raw fruits or vegetables.  Avoid exposure to groups of people or children.  Avoid being around people who are sick.  Avoid being around dirt or dust, such as in construction areas or gardens.  Do not provide direct care for pets. Avoid animal droppings. Do not clean litter boxes and bird cages. Hygiene   Bathe daily.  Clean the area between the genitals and the anus (perineal area) after you urinate or have a bowel movement. If you are female, wipe from front to back.  Brush your teeth with a soft toothbrush before and after meals.  Do not use a razor that has a blade. Use an electric razor to remove hair.  Wash your hands often. Make sure others who come in contact with you also wash their hands. If soap and water are not available, use hand sanitizer. General instructions  Do not have sex unless your health care provider has approved.  Take actions to avoid cuts and burns. For example: ? Be cautious when you use knives. Always cut away from yourself. ? Keep knives in protective sheaths or guards when not in use. ? Use oven mitts when you cook with a hot stove, oven, or grill. ? Stand a safe distance away from open fires.  Avoid people who received a vaccine in the past 30 days if that vaccine contained a live version of the germ (live vaccine). You should not get a live vaccine. Common live vaccines are varicella, measles, mumps, and rubella.  Do not share food utensils.  Do not use tampons, enemas, or rectal suppositories unless your health care provider has approved.  Keep all appointments as told by your health care provider. This is important. Contact a health care provider if:  You have a fever.  You have chills or you start to shake.  You have: ? A sore throat. ? A warm, red, or tender area on your skin. ? A cough. ? Frequent or painful urination. ? Vaginal discharge or itching.  You develop: ? Sores in your mouth  or anus. ? Swollen lymph nodes. ? Red streaks on the skin. ? A rash.  You feel: ? Nauseous or you vomit. ? Very fatigued. ? Short of breath. This information is not intended to replace advice given to you by your health care provider. Make sure you discuss any questions you have with your health care provider. Document Released: 07/28/2001 Document Revised: 07/14/2015 Document Reviewed: 08/18/2014 Elsevier Interactive Patient Education  2018 [employer].    Thrombocytopenia Thrombocytopenia means that you have a low number of platelets in your blood. Platelets are tiny cells in the blood. When you bleed, they clump together at the cut or injury to stop the bleeding. This is called blood clotting. Not having enough platelets can cause bleeding problems. Follow these instructions at home: General instructions  Check your skin and inside your mouth for bruises or blood as told by your doctor.  Check to see if there is blood in your spit (sputum), pee (urine), and poop (stool). Do this as told by your doctor.  Ask your doctor if you can drink alcohol.  Take over-the-counter and prescription medicines only as told by your doctor.  Tell all of your doctors that you have this condition. Be sure  to tell your dentist and eye doctor too. Activity  Do not do activities that can cause bumps or bruises until your doctor says it is okay.  Be careful not to cut yourself: ? When you shave. ? When you use scissors, needles, knives, or other tools.  Be careful not to burn yourself: ? When you use an iron. ? When you cook. Contact a doctor if:  You have bruises and you do not know why. Get help right away if:  You are bleeding anywhere on your body.  You have blood in your spit, pee, or poop. This information is not intended to replace advice given to you by your health care provider. Make sure you discuss any questions you have with your health care provider. Document Released:  01/25/2011 Document Revised: 10/09/2015 Document Reviewed: 08/09/2014 Elsevier Interactive Patient Education  [employer].

## 2017-05-02 ENCOUNTER — Telehealth: Payer: Self-pay | Admitting: Oncology

## 2017-05-02 ENCOUNTER — Inpatient Hospital Stay: Payer: PPO

## 2017-05-02 VITALS — BP 115/78 | HR 62 | Temp 98.0°F | Resp 18

## 2017-05-02 DIAGNOSIS — Z5189 Encounter for other specified aftercare: Secondary | ICD-10-CM | POA: Diagnosis not present

## 2017-05-02 DIAGNOSIS — C482 Malignant neoplasm of peritoneum, unspecified: Secondary | ICD-10-CM

## 2017-05-02 DIAGNOSIS — C50919 Malignant neoplasm of unspecified site of unspecified female breast: Secondary | ICD-10-CM

## 2017-05-02 DIAGNOSIS — Z95828 Presence of other vascular implants and grafts: Secondary | ICD-10-CM

## 2017-05-02 LAB — CA 125: CANCER ANTIGEN (CA) 125: 29.7 U/mL (ref 0.0–38.1)

## 2017-05-02 MED ORDER — PEGFILGRASTIM INJECTION 6 MG/0.6ML ~~LOC~~
PREFILLED_SYRINGE | SUBCUTANEOUS | Status: AC
  Filled 2017-05-02: qty 0.6

## 2017-05-02 MED ORDER — SODIUM CHLORIDE 0.9 % IJ SOLN
10.0000 mL | INTRAMUSCULAR | Status: DC | PRN
  Filled 2017-05-02: qty 10

## 2017-05-02 MED ORDER — PEGFILGRASTIM INJECTION 6 MG/0.6ML ~~LOC~~
6.0000 mg | PREFILLED_SYRINGE | Freq: Once | SUBCUTANEOUS | Status: AC
  Administered 2017-05-02: 6 mg via SUBCUTANEOUS

## 2017-05-02 NOTE — Telephone Encounter (Signed)
Roz called me and stated that the patient needed her injection changed to a later time today due to insurance - I spoke with the patient and moved appts to afternoon.

## 2017-05-03 DIAGNOSIS — F3131 Bipolar disorder, current episode depressed, mild: Secondary | ICD-10-CM | POA: Diagnosis not present

## 2017-05-11 ENCOUNTER — Encounter: Payer: Self-pay | Admitting: Family Medicine

## 2017-05-17 ENCOUNTER — Other Ambulatory Visit: Payer: Self-pay | Admitting: Family Medicine

## 2017-05-22 ENCOUNTER — Inpatient Hospital Stay: Payer: PPO

## 2017-05-22 ENCOUNTER — Telehealth: Payer: Self-pay

## 2017-05-22 ENCOUNTER — Inpatient Hospital Stay: Payer: PPO | Attending: Oncology | Admitting: Oncology

## 2017-05-22 VITALS — BP 123/59 | HR 68 | Temp 97.9°F | Resp 18 | Ht 61.0 in | Wt 155.3 lb

## 2017-05-22 DIAGNOSIS — Z170 Estrogen receptor positive status [ER+]: Secondary | ICD-10-CM

## 2017-05-22 DIAGNOSIS — C50919 Malignant neoplasm of unspecified site of unspecified female breast: Secondary | ICD-10-CM

## 2017-05-22 DIAGNOSIS — R5383 Other fatigue: Secondary | ICD-10-CM | POA: Diagnosis not present

## 2017-05-22 DIAGNOSIS — Z923 Personal history of irradiation: Secondary | ICD-10-CM

## 2017-05-22 DIAGNOSIS — C482 Malignant neoplasm of peritoneum, unspecified: Secondary | ICD-10-CM

## 2017-05-22 DIAGNOSIS — Z79899 Other long term (current) drug therapy: Secondary | ICD-10-CM | POA: Diagnosis not present

## 2017-05-22 DIAGNOSIS — Z5111 Encounter for antineoplastic chemotherapy: Secondary | ICD-10-CM | POA: Diagnosis not present

## 2017-05-22 DIAGNOSIS — D696 Thrombocytopenia, unspecified: Secondary | ICD-10-CM

## 2017-05-22 DIAGNOSIS — D701 Agranulocytosis secondary to cancer chemotherapy: Secondary | ICD-10-CM | POA: Diagnosis not present

## 2017-05-22 DIAGNOSIS — Z853 Personal history of malignant neoplasm of breast: Secondary | ICD-10-CM | POA: Diagnosis not present

## 2017-05-22 DIAGNOSIS — R110 Nausea: Secondary | ICD-10-CM

## 2017-05-22 DIAGNOSIS — Z5189 Encounter for other specified aftercare: Secondary | ICD-10-CM | POA: Diagnosis not present

## 2017-05-22 DIAGNOSIS — C50412 Malignant neoplasm of upper-outer quadrant of left female breast: Secondary | ICD-10-CM

## 2017-05-22 DIAGNOSIS — Z95828 Presence of other vascular implants and grafts: Secondary | ICD-10-CM

## 2017-05-22 LAB — CBC WITH DIFFERENTIAL (CANCER CENTER ONLY)
Basophils Absolute: 0 10*3/uL (ref 0.0–0.1)
Basophils Relative: 1 %
EOS PCT: 2 %
Eosinophils Absolute: 0 10*3/uL (ref 0.0–0.5)
HCT: 28 % — ABNORMAL LOW (ref 34.8–46.6)
HEMOGLOBIN: 8.8 g/dL — AB (ref 11.6–15.9)
LYMPHS ABS: 0.9 10*3/uL (ref 0.9–3.3)
LYMPHS PCT: 45 %
MCH: 31.5 pg (ref 25.1–34.0)
MCHC: 31.4 g/dL — ABNORMAL LOW (ref 31.5–36.0)
MCV: 100.4 fL (ref 79.5–101.0)
Monocytes Absolute: 0.2 10*3/uL (ref 0.1–0.9)
Monocytes Relative: 12 %
Neutro Abs: 0.8 10*3/uL — ABNORMAL LOW (ref 1.5–6.5)
Neutrophils Relative %: 40 %
PLATELETS: 54 10*3/uL — AB (ref 145–400)
RBC: 2.78 MIL/uL — ABNORMAL LOW (ref 3.70–5.45)
RDW: 20 % — ABNORMAL HIGH (ref 11.2–14.5)
WBC: 1.9 10*3/uL — AB (ref 3.9–10.3)

## 2017-05-22 LAB — CMP (CANCER CENTER ONLY)
ALBUMIN: 3.2 g/dL — AB (ref 3.5–5.0)
ALT: 9 U/L (ref 0–55)
AST: 16 U/L (ref 5–34)
Alkaline Phosphatase: 79 U/L (ref 40–150)
Anion gap: 7 (ref 3–11)
BUN: 12 mg/dL (ref 7–26)
CHLORIDE: 110 mmol/L — AB (ref 98–109)
CO2: 26 mmol/L (ref 22–29)
Calcium: 8.9 mg/dL (ref 8.4–10.4)
Creatinine: 0.67 mg/dL (ref 0.60–1.10)
GFR, Est AFR Am: >60 mL/min (ref >60)
Glucose, Bld: 91 mg/dL (ref 70–140)
POTASSIUM: 4 mmol/L (ref 3.5–5.1)
SODIUM: 143 mmol/L (ref 136–145)
Total Bilirubin: 0.2 mg/dL (ref 0.2–1.2)
Total Protein: 6.9 g/dL (ref 6.4–8.3)

## 2017-05-22 MED ORDER — SODIUM CHLORIDE 0.9% FLUSH
10.0000 mL | INTRAVENOUS | Status: DC | PRN
  Administered 2017-05-22: 10 mL
  Filled 2017-05-22: qty 10

## 2017-05-22 MED ORDER — FAMOTIDINE IN NACL 20-0.9 MG/50ML-% IV SOLN
20.0000 mg | Freq: Two times a day (BID) | INTRAVENOUS | Status: DC
  Administered 2017-05-22: 20 mg via INTRAVENOUS

## 2017-05-22 MED ORDER — DIPHENHYDRAMINE HCL 50 MG/ML IJ SOLN
50.0000 mg | Freq: Once | INTRAMUSCULAR | Status: AC
  Administered 2017-05-22: 50 mg via INTRAVENOUS

## 2017-05-22 MED ORDER — FAMOTIDINE IN NACL 20-0.9 MG/50ML-% IV SOLN
INTRAVENOUS | Status: AC
  Filled 2017-05-22: qty 50

## 2017-05-22 MED ORDER — DEXAMETHASONE SODIUM PHOSPHATE 10 MG/ML IJ SOLN
INTRAMUSCULAR | Status: AC
  Filled 2017-05-22: qty 1

## 2017-05-22 MED ORDER — SODIUM CHLORIDE 0.9 % IV SOLN
570.0000 mg | Freq: Once | INTRAVENOUS | Status: AC
  Administered 2017-05-22: 570 mg via INTRAVENOUS
  Filled 2017-05-22: qty 57

## 2017-05-22 MED ORDER — DEXAMETHASONE SODIUM PHOSPHATE 10 MG/ML IJ SOLN
4.0000 mg | Freq: Once | INTRAMUSCULAR | Status: AC
  Administered 2017-05-22: 4 mg via INTRAVENOUS

## 2017-05-22 MED ORDER — HEPARIN SOD (PORK) LOCK FLUSH 100 UNIT/ML IV SOLN
500.0000 [IU] | Freq: Once | INTRAVENOUS | Status: DC | PRN
  Filled 2017-05-22: qty 5

## 2017-05-22 MED ORDER — PALONOSETRON HCL INJECTION 0.25 MG/5ML
0.2500 mg | Freq: Once | INTRAVENOUS | Status: AC
  Administered 2017-05-22: 0.25 mg via INTRAVENOUS

## 2017-05-22 MED ORDER — SODIUM CHLORIDE 0.9 % IV SOLN
Freq: Once | INTRAVENOUS | Status: AC
  Administered 2017-05-22: 10:00:00 via INTRAVENOUS

## 2017-05-22 MED ORDER — HEPARIN SOD (PORK) LOCK FLUSH 100 UNIT/ML IV SOLN
500.0000 [IU] | Freq: Once | INTRAVENOUS | Status: AC | PRN
  Administered 2017-05-22: 500 [IU]
  Filled 2017-05-22: qty 5

## 2017-05-22 MED ORDER — PALONOSETRON HCL INJECTION 0.25 MG/5ML
INTRAVENOUS | Status: AC
  Filled 2017-05-22: qty 5

## 2017-05-22 MED ORDER — SODIUM CHLORIDE 0.9 % IJ SOLN
10.0000 mL | INTRAMUSCULAR | Status: DC | PRN
  Administered 2017-05-22: 10 mL via INTRAVENOUS
  Filled 2017-05-22: qty 10

## 2017-05-22 MED ORDER — DIPHENHYDRAMINE HCL 50 MG/ML IJ SOLN
INTRAMUSCULAR | Status: AC
  Filled 2017-05-22: qty 1

## 2017-05-22 NOTE — Progress Notes (Signed)
Hematology and Oncology Follow Up Visit  [name] [id number] February 18, 1956 62 y.o. 05/22/2017 9:37 AM    Principle Diagnosis: 62 year old woman with:  1. 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    2. 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 The tumor is ER positive PR negative, HER-2 negative.   Prior Therapy:  Carboplatin and Taxotere started on 10/01/2012. Avastin was added with cycle 4. Chemotherapy only discontinued in June of 2015.  She is S/P Avastin maintenance only between June 2015 till March 2017. Therapy discontinued due to progression of disease.  She is status post Doxil salvage chemotherapy therapy discontinued because of hypersensitivity reaction in March 2017.  Current therapy:  Carboplatin and AUC of 5 every 3 weeks cycle 1 started on 07/08/2015. She is here for the next cycle of therapy.  Interim History: [name] is here for a follow-up. She continues to tolerate chemotherapy without any new complications.  She does report grade 1 nausea but no excessive fatigue or tiredness.  She denies any vomiting.  She does report occasional lethargy, sleepiness and unsteadiness.  She reports that a lot of these complaints may be related to gabapentin.  She is no longer reporting any neuropathy at this time.  Her quality of life and performance status remains adequate.  She denies any recurrent infections or hospitalizations.  She denies any bleeding or epistaxis.  She does not report any headaches, blurry vision, syncope or seizures. She denied any fever, chills or sweats.  Weight and appetite remain stable.  She does not report any chest pain, palpitation, orthopnea or leg edema.  Has not reported any shortness of breath or cough. She does not report any vomiting, constipation or hematochezia.  She reports stable mood.  She does not report any  hematuria or dysuria.  She does not report any heat or cold intolerance.  She does not report any arthralgias or myalgias.  She does not report any lymphadenopathy or petechiae.  Remainder of the review of systems is negative.   Medications: I have reviewed the patient's current medications.  Current Outpatient Medications  Medication Sig Dispense Refill  . acetaminophen (TYLENOL) 325 MG tablet Take 2 tablets (650 mg total) by mouth every 4 (four) hours as needed for mild pain, fever or headache. 30 tablet 0  . Acetylcarnitine HCl (ACETYL L-CARNITINE PO) Take 2,000 mg by mouth at bedtime.    . ALPRAZolam (XANAX) 0.5 MG tablet Take 0.5 mg by mouth 2 (two) times daily as needed for anxiety.    Marland Kitchen aspirin 81 MG chewable tablet Chew 1 tablet (81 mg total) by mouth daily. 30 tablet 0  . atorvastatin (LIPITOR) 40 MG tablet Take 1 tablet (40 mg total) by mouth daily. 90 tablet 2  . carvedilol (COREG) 3.125 MG tablet Take 1/2 tablet by mouth twice daily with meals. 30 tablet 9  . clotrimazole-betamethasone (LOTRISONE) cream Apply 1 application topically 2 (two) times daily. 30 g 1  . divalproex (DEPAKOTE) 250 MG DR tablet Take by mouth. Take 529ms in the morning and 7555m at night    . fluticasone (FLONASE) 50 MCG/ACT nasal spray INHALE 2 SPRAYS INTO EACH NOSTRIL EVERY NIGHT  11  . gabapentin (NEURONTIN) 100 MG capsule Take 1 capsule by mouth twice daily. 180 capsule 2  . lamoTRIgine (LAMICTAL) 100 MG tablet Take 100 mg by mouth 2 (two) times daily.    .Marland Kitchen  levothyroxine (SYNTHROID, LEVOTHROID) 100 MCG tablet Take 1 tablet (100 mcg total) by mouth daily. 90 tablet 2  . lidocaine-prilocaine (EMLA) cream Apply topically as needed. Apply to port with every chemotherapy. 30 g 0  . loperamide (IMODIUM A-D) 2 MG tablet Take 2 mg by mouth as needed for diarrhea or loose stools.    Marland Kitchen loratadine (CLARITIN) 10 MG tablet Take 10 mg by mouth daily as needed for allergies (with chemo).    . nitroGLYCERIN (NITROSTAT)  0.4 MG SL tablet Place 1 tablet (0.4 mg total) under the tongue every 5 (five) minutes as needed for chest pain. 25 tablet 3  . ondansetron (ZOFRAN ODT) 8 MG disintegrating tablet Take 1 tablet (8 mg total) by mouth every 8 (eight) hours as needed for nausea or vomiting. 20 tablet 1  . ondansetron (ZOFRAN) 8 MG tablet TAKE 1 TABLET BY MOUTH EVERY 8 HOURS AS NEEDED FOR NAUSEA AND VOMITING 30 tablet 2  . pantoprazole (PROTONIX) 40 MG tablet Take 1 tablet (40 mg total) by mouth daily. 90 tablet 1  . prochlorperazine (COMPAZINE) 10 MG tablet Take 1 tablet (10 mg total) by mouth every 6 (six) hours as needed for nausea or vomiting. 30 tablet 0  . rOPINIRole (REQUIP) 0.5 MG tablet Take 3 tablets by mouth at bedtime. 90 tablet 4  . sertraline (ZOLOFT) 50 MG tablet      No current facility-administered medications for this visit.    Facility-Administered Medications Ordered in Other Visits  Medication Dose Route Frequency Provider Last Rate Last Dose  . heparin lock flush 100 unit/mL  500 Units Intravenous Once Wyatt Portela, MD      . heparin lock flush 100 unit/mL  500 Units Intravenous Once PRN Wyatt Portela, MD      . sodium chloride 0.9 % injection 10 mL  10 mL Intravenous PRN Wyatt Portela, MD   10 mL at 05/22/17 0928  . sodium chloride flush (NS) 0.9 % injection 10 mL  10 mL Intravenous PRN Wyatt Portela, MD         Allergies:  Allergies  Allergen Reactions  . Doxil [Doxorubicin Hcl Liposomal] Anaphylaxis    1st Doxil.   . Pollen Extract Other (See Comments)    Pollen and grass causes a lot sneezing    Past Medical History, Surgical history, Social history, and Family History reviewed and unchanged.    Physical Exam: Blood pressure (!) 123/59, pulse 68, temperature 97.9 F (36.6 C), temperature source Oral, resp. rate 18, height _0  (1.549 m), weight 155 lb 4.8 oz (70.4 kg), SpO2 100 %.   ECOG: 1 General appearance: Well-appearing woman without distress. Head:  Atraumatic without abnormalities. Oral mucosa: Without thrush or ulcers. Eyes: Pupils are equal and round reactive to light. Lymph nodes: No lymphadenopathy palpated in the cervical, axillary or supraclavicular regions. Heart: Regular rate without any murmurs or gallops. Lung: Clear in all lung fields without any rhonchi, wheezes or dullness to percussion. Abdomin: Soft, nontender, nondistended with good bowel sounds.  No rebound or guarding. Musculoskeletal: No clubbing or cyanosis.  Full range of motion noted. Skin: No ecchymosis or petechiae. Neurological: No deficits.  Ambulating without difficulties. Psychiatric: Mood and affect remained appropriate.  Lab Results: Lab Results  Component Value Date   WBC 2.4 (L) 05/01/2017   HGB 10.0 (L) 02/27/2017   HCT 29.0 (L) 05/01/2017   MCV 99.2 05/01/2017   PLT 55 (L) 05/01/2017     Chemistry  Component Value Date/Time   NA 142 05/01/2017 0916   NA 142 01/21/2017 0822   K 4.0 05/01/2017 0916   K 4.1 01/21/2017 0822   CL 108 05/01/2017 0916   CO2 28 05/01/2017 0916   CO2 27 01/21/2017 0822   BUN 11 05/01/2017 0916   BUN 12.2 01/21/2017 0822   CREATININE 0.65 05/01/2017 0916   CREATININE 0.7 01/21/2017 0822      Component Value Date/Time   CALCIUM 9.1 05/01/2017 0916   CALCIUM 8.9 01/21/2017 0822   ALKPHOS 78 05/01/2017 0916   ALKPHOS 75 01/21/2017 0822   AST 12 05/01/2017 0916   AST 12 01/21/2017 0822   ALT 6 05/01/2017 0916   ALT 7 01/21/2017 0822   BILITOT 0.3 05/01/2017 0916   BILITOT 0.23 01/21/2017 0822      Results for ELLYN, RUBIANO (MRN [id number]) as of 05/22/2017 09:37  Ref. Range 03/20/2017 09:07 04/10/2017 08:15 05/01/2017 09:16  Cancer Antigen (CA) 125 Latest Ref Range: 0.0 - 38.1 U/mL 31.2 39.3 (H) 29.7     Impression and Plan:  62 year old woman with the following issues:  1. Peritoneal carcinomatosis from a GYN source arising from the coelomic epithelium and ovaries.    She is currently  receiving carboplatin single agent at AUC of 5 with reasonable response to therapy and no major complications noted.  CT scan from March 2019 was reviewed again and showed stable disease.  Risks and benefits of continuing this therapy was reviewed today she is agreeable to continue.  Long-term complications from her cytopenias would include excessive fatigue, bleeding and possible infections.  2. IV access: Port-A-Cath remains in place without complications.  3. Thrombocytopenia: Stable overall without any active bleeding.  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  4. Left breast cancer with T2 N1 invasive ductal carcinoma with the tumor have ER positive, PR negative HER-2 negative.  No surgical treatment proceed at this time given her peritoneal carcinomatosis.  She will continue periodic monitoring at this time.  Primary surgical therapy will be used if she develops any local symptoms.   5. Neutropenia: Related to chemotherapy.  Neutropenic precautions were given to the patient today.  She will continue to receive Neulasta after each cycle.  6. Nausea: Remain in grade 1 and manageable at this time.  7.  Unsteadiness and overall lethargy: I asked her to taper down her Neurontin which could be contributing to the symptoms.  8. Followup: She will follow-up in 3 weeks for chemotherapy infusion.    25  minutes was spent with the patient face-to-face today.  More than 50% of time was dedicated to patient counseling, education and answering questions regarding future plan of care.   Julie Button, MD 4/3/20199:37 AM

## 2017-05-22 NOTE — Patient Instructions (Signed)
Implanted Port Home Guide An implanted port is a type of central line that is placed under the skin. Central lines are used to provide IV access when treatment or nutrition needs to be given through a person's veins. Implanted ports are used for long-term IV access. An implanted port may be placed because:  You need IV medicine that would be irritating to the small veins in your hands or arms.  You need long-term IV medicines, such as antibiotics.  You need IV nutrition for a long period.  You need frequent blood draws for lab tests.  You need dialysis.  Implanted ports are usually placed in the chest area, but they can also be placed in the upper arm, the abdomen, or the leg. An implanted port has two main parts:  Reservoir. The reservoir is round and will appear as a small, raised area under your skin. The reservoir is the part where a needle is inserted to give medicines or draw blood.  Catheter. The catheter is a thin, flexible tube that extends from the reservoir. The catheter is placed into a large vein. Medicine that is inserted into the reservoir goes into the catheter and then into the vein.  How will I care for my incision site? Do not get the incision site wet. Bathe or shower as directed by your health care provider. How is my port accessed? Special steps must be taken to access the port:  Before the port is accessed, a numbing cream can be placed on the skin. This helps numb the skin over the port site.  Your health care provider uses a sterile technique to access the port. ? Your health care provider must put on a mask and sterile gloves. ? The skin over your port is cleaned carefully with an antiseptic and allowed to dry. ? The port is gently pinched between sterile gloves, and a needle is inserted into the port.  Only "non-coring" port needles should be used to access the port. Once the port is accessed, a blood return should be checked. This helps ensure that the port  is in the vein and is not clogged.  If your port needs to remain accessed for a constant infusion, a clear (transparent) bandage will be placed over the needle site. The bandage and needle will need to be changed every week, or as directed by your health care provider.  Keep the bandage covering the needle clean and dry. Do not get it wet. Follow your health care provider's instructions on how to take a shower or bath while the port is accessed.  If your port does not need to stay accessed, no bandage is needed over the port.  What is flushing? Flushing helps keep the port from getting clogged. Follow your health care provider's instructions on how and when to flush the port. Ports are usually flushed with saline solution or a medicine called heparin. The need for flushing will depend on how the port is used.  If the port is used for intermittent medicines or blood draws, the port will need to be flushed: ? After medicines have been given. ? After blood has been drawn. ? As part of routine maintenance.  If a constant infusion is running, the port may not need to be flushed.  How long will my port stay implanted? The port can stay in for as long as your health care provider thinks it is needed. When it is time for the port to come out, surgery will be   done to remove it. The procedure is similar to the one performed when the port was put in. When should I seek immediate medical care? When you have an implanted port, you should seek immediate medical care if:  You notice a bad smell coming from the incision site.  You have swelling, redness, or drainage at the incision site.  You have more swelling or pain at the port site or the surrounding area.  You have a fever that is not controlled with medicine.  This information is not intended to replace advice given to you by your health care provider. Make sure you discuss any questions you have with your health care provider. Document  Released: 02/05/2005 Document Revised: 07/14/2015 Document Reviewed: 10/13/2012 Elsevier Interactive Patient Education  2017 [employer]  

## 2017-05-22 NOTE — Telephone Encounter (Signed)
Printed avs and calender of upcoming appointment. Per 4/3 los 

## 2017-05-22 NOTE — Progress Notes (Signed)
Pt anc 0.8. Per Dr.Shadad, okay to treat today.

## 2017-05-22 NOTE — Patient Instructions (Signed)
[facility] Discharge Instructions for Patients Receiving Chemotherapy  Today you received the following chemotherapy agents: Carboplatin (Paraplatin)  To help prevent nausea and vomiting after your treatment, we encourage you to take your nausea medication as prescribed. If you develop nausea and vomiting that is not controlled by your nausea medication, call the clinic.   BELOW ARE SYMPTOMS THAT SHOULD BE REPORTED IMMEDIATELY:  *FEVER GREATER THAN 100.5 F  *CHILLS WITH OR WITHOUT FEVER  NAUSEA AND VOMITING THAT IS NOT CONTROLLED WITH YOUR NAUSEA MEDICATION  *UNUSUAL SHORTNESS OF BREATH  *UNUSUAL BRUISING OR BLEEDING  TENDERNESS IN MOUTH AND THROAT WITH OR WITHOUT PRESENCE OF ULCERS  *URINARY PROBLEMS  *BOWEL PROBLEMS  UNUSUAL RASH Items with * indicate a potential emergency and should be followed up as soon as possible.  Feel free to call the clinic should you have any questions or concerns. The clinic phone number is [phone].  Please show the CHEMO ALERT CARD at check-in to the Emergency Department and triage nurse.   

## 2017-05-23 ENCOUNTER — Inpatient Hospital Stay: Payer: PPO

## 2017-05-23 ENCOUNTER — Ambulatory Visit: Payer: PPO

## 2017-05-23 VITALS — BP 121/55 | HR 65 | Temp 98.4°F | Resp 20

## 2017-05-23 DIAGNOSIS — C482 Malignant neoplasm of peritoneum, unspecified: Secondary | ICD-10-CM

## 2017-05-23 DIAGNOSIS — Z5189 Encounter for other specified aftercare: Secondary | ICD-10-CM | POA: Diagnosis not present

## 2017-05-23 LAB — CA 125: CANCER ANTIGEN (CA) 125: 26.8 U/mL (ref 0.0–38.1)

## 2017-05-23 MED ORDER — PEGFILGRASTIM INJECTION 6 MG/0.6ML ~~LOC~~
6.0000 mg | PREFILLED_SYRINGE | Freq: Once | SUBCUTANEOUS | Status: AC
  Administered 2017-05-23: 6 mg via SUBCUTANEOUS

## 2017-05-23 MED ORDER — PEGFILGRASTIM INJECTION 6 MG/0.6ML ~~LOC~~
PREFILLED_SYRINGE | SUBCUTANEOUS | Status: AC
  Filled 2017-05-23: qty 0.6

## 2017-05-23 NOTE — Patient Instructions (Signed)
Pegfilgrastim injection What is this medicine? PEGFILGRASTIM (PEG fil gra stim) is a long-acting granulocyte colony-stimulating factor that stimulates the growth of neutrophils, a type of white blood cell important in the body's fight against infection. It is used to reduce the incidence of fever and infection in patients with certain types of cancer who are receiving chemotherapy that affects the bone marrow, and to increase survival after being exposed to high doses of radiation. This medicine may be used for other purposes; ask your health care provider or pharmacist if you have questions. What should I tell my health care provider before I take this medicine? They need to know if you have any of these conditions: -kidney disease -latex allergy -ongoing radiation therapy -sickle cell disease -skin reactions to acrylic adhesives (On-Body Injector only) -an unusual or allergic reaction to pegfilgrastim, filgrastim, other medicines, foods, dyes, or preservatives -pregnant or trying to get pregnant -breast-feeding How should I use this medicine? This medicine is for injection under the skin. If you get this medicine at home, you will be taught how to prepare and give the pre-filled syringe or how to use the On-body Injector. Refer to the patient Instructions for Use for detailed instructions. Use exactly as directed. Take your medicine at regular intervals. Do not take your medicine more often than directed. It is important that you put your used needles and syringes in a special sharps container. Do not put them in a trash can. If you do not have a sharps container, call your pharmacist or healthcare provider to get one. Talk to your pediatrician regarding the use of this medicine in children. While this drug may be prescribed for selected conditions, precautions do apply. Overdosage: If you think you have taken too much of this medicine contact a poison control center or emergency room at  once. NOTE: This medicine is only for you. Do not share this medicine with others. What if I miss a dose? It is important not to miss your dose. Call your doctor or health care professional if you miss your dose. If you miss a dose due to an On-body Injector failure or leakage, a new dose should be administered as soon as possible using a single prefilled syringe for manual use. What may interact with this medicine? Interactions have not been studied. Give your health care provider a list of all the medicines, herbs, non-prescription drugs, or dietary supplements you use. Also tell them if you smoke, drink alcohol, or use illegal drugs. Some items may interact with your medicine. This list may not describe all possible interactions. Give your health care provider a list of all the medicines, herbs, non-prescription drugs, or dietary supplements you use. Also tell them if you smoke, drink alcohol, or use illegal drugs. Some items may interact with your medicine. What should I watch for while using this medicine? You may need blood work done while you are taking this medicine. If you are going to need a MRI, CT scan, or other procedure, tell your doctor that you are using this medicine (On-Body Injector only). What side effects may I notice from receiving this medicine? Side effects that you should report to your doctor or health care professional as soon as possible: -allergic reactions like skin rash, itching or hives, swelling of the face, lips, or tongue -dizziness -fever -pain, redness, or irritation at site where injected -pinpoint red spots on the skin -red or dark-brown urine -shortness of breath or breathing problems -stomach or side pain, or pain   at the shoulder -swelling -tiredness -trouble passing urine or change in the amount of urine Side effects that usually do not require medical attention (report to your doctor or health care professional if they continue or are  bothersome): -bone pain -muscle pain This list may not describe all possible side effects. Call your doctor for medical advice about side effects. You may report side effects to FDA at 1-800-FDA-1088. Where should I keep my medicine? Keep out of the reach of children. Store pre-filled syringes in a refrigerator between 2 and 8 degrees C (36 and 46 degrees F). Do not freeze. Keep in carton to protect from light. Throw away this medicine if it is left out of the refrigerator for more than 48 hours. Throw away any unused medicine after the expiration date. NOTE: This sheet is a summary. It may not cover all possible information. If you have questions about this medicine, talk to your doctor, pharmacist, or health care provider.    2016, Elsevier/Gold Standard. (2014-02-25 14:30:14)  

## 2017-06-01 DIAGNOSIS — G4733 Obstructive sleep apnea (adult) (pediatric): Secondary | ICD-10-CM | POA: Diagnosis not present

## 2017-06-03 ENCOUNTER — Other Ambulatory Visit: Payer: PPO

## 2017-06-03 ENCOUNTER — Encounter: Payer: Self-pay | Admitting: Oncology

## 2017-06-03 ENCOUNTER — Ambulatory Visit: Payer: PPO

## 2017-06-03 ENCOUNTER — Ambulatory Visit: Payer: PPO | Admitting: Oncology

## 2017-06-05 ENCOUNTER — Ambulatory Visit: Payer: PPO

## 2017-06-12 ENCOUNTER — Other Ambulatory Visit: Payer: PPO

## 2017-06-12 ENCOUNTER — Telehealth: Payer: Self-pay

## 2017-06-12 NOTE — Telephone Encounter (Signed)
Per 4/24 in basket request to move appointment 24 hrs from treatment

## 2017-06-13 ENCOUNTER — Inpatient Hospital Stay: Payer: PPO

## 2017-06-13 ENCOUNTER — Inpatient Hospital Stay (HOSPITAL_BASED_OUTPATIENT_CLINIC_OR_DEPARTMENT_OTHER): Payer: PPO | Admitting: Oncology

## 2017-06-13 VITALS — BP 129/54 | HR 57 | Temp 98.1°F | Resp 18 | Ht 61.0 in | Wt 153.1 lb

## 2017-06-13 DIAGNOSIS — C482 Malignant neoplasm of peritoneum, unspecified: Secondary | ICD-10-CM

## 2017-06-13 DIAGNOSIS — Z923 Personal history of irradiation: Secondary | ICD-10-CM | POA: Diagnosis not present

## 2017-06-13 DIAGNOSIS — C50919 Malignant neoplasm of unspecified site of unspecified female breast: Secondary | ICD-10-CM

## 2017-06-13 DIAGNOSIS — Z79899 Other long term (current) drug therapy: Secondary | ICD-10-CM

## 2017-06-13 DIAGNOSIS — Z5189 Encounter for other specified aftercare: Secondary | ICD-10-CM | POA: Diagnosis not present

## 2017-06-13 DIAGNOSIS — Z170 Estrogen receptor positive status [ER+]: Principal | ICD-10-CM

## 2017-06-13 DIAGNOSIS — D701 Agranulocytosis secondary to cancer chemotherapy: Secondary | ICD-10-CM | POA: Diagnosis not present

## 2017-06-13 DIAGNOSIS — D696 Thrombocytopenia, unspecified: Secondary | ICD-10-CM

## 2017-06-13 DIAGNOSIS — R5383 Other fatigue: Secondary | ICD-10-CM

## 2017-06-13 DIAGNOSIS — Z853 Personal history of malignant neoplasm of breast: Secondary | ICD-10-CM | POA: Diagnosis not present

## 2017-06-13 DIAGNOSIS — Z95828 Presence of other vascular implants and grafts: Secondary | ICD-10-CM

## 2017-06-13 DIAGNOSIS — C50412 Malignant neoplasm of upper-outer quadrant of left female breast: Secondary | ICD-10-CM

## 2017-06-13 LAB — CBC WITH DIFFERENTIAL (CANCER CENTER ONLY)
Basophils Absolute: 0 K/uL (ref 0.0–0.1)
Basophils Relative: 0 %
Eosinophils Absolute: 0.1 K/uL (ref 0.0–0.5)
Eosinophils Relative: 2 %
HCT: 31.6 % — ABNORMAL LOW (ref 34.8–46.6)
Hemoglobin: 9.5 g/dL — ABNORMAL LOW (ref 11.6–15.9)
Lymphocytes Relative: 49 %
Lymphs Abs: 1.1 K/uL (ref 0.9–3.3)
MCH: 31.7 pg (ref 25.1–34.0)
MCHC: 30.1 g/dL — ABNORMAL LOW (ref 31.5–36.0)
MCV: 105.3 fL — ABNORMAL HIGH (ref 79.5–101.0)
Monocytes Absolute: 0.2 K/uL (ref 0.1–0.9)
Monocytes Relative: 9 %
Neutro Abs: 0.9 K/uL — ABNORMAL LOW (ref 1.5–6.5)
Neutrophils Relative %: 40 %
Platelet Count: 49 K/uL — ABNORMAL LOW (ref 145–400)
RBC: 3 MIL/uL — ABNORMAL LOW (ref 3.70–5.45)
RDW: 16.2 % — ABNORMAL HIGH (ref 11.2–14.5)
WBC Count: 2.3 K/uL — ABNORMAL LOW (ref 3.9–10.3)

## 2017-06-13 LAB — CMP (CANCER CENTER ONLY)
ALBUMIN: 3.3 g/dL — AB (ref 3.5–5.0)
ALT: 9 U/L (ref 0–55)
ANION GAP: 7 (ref 3–11)
AST: 16 U/L (ref 5–34)
Alkaline Phosphatase: 74 U/L (ref 40–150)
BILIRUBIN TOTAL: 0.2 mg/dL (ref 0.2–1.2)
BUN: 7 mg/dL (ref 7–26)
CHLORIDE: 109 mmol/L (ref 98–109)
CO2: 26 mmol/L (ref 22–29)
Calcium: 8.8 mg/dL (ref 8.4–10.4)
Creatinine: 0.66 mg/dL (ref 0.60–1.10)
GFR, Est AFR Am: >60 mL/min (ref >60)
GFR, Estimated: >60 mL/min (ref >60)
GLUCOSE: 104 mg/dL (ref 70–140)
POTASSIUM: 3.5 mmol/L (ref 3.5–5.1)
Sodium: 142 mmol/L (ref 136–145)
TOTAL PROTEIN: 6.9 g/dL (ref 6.4–8.3)

## 2017-06-13 MED ORDER — DEXAMETHASONE SODIUM PHOSPHATE 10 MG/ML IJ SOLN
INTRAMUSCULAR | Status: AC
  Filled 2017-06-13: qty 1

## 2017-06-13 MED ORDER — PALONOSETRON HCL INJECTION 0.25 MG/5ML
0.2500 mg | Freq: Once | INTRAVENOUS | Status: AC
  Administered 2017-06-13: 0.25 mg via INTRAVENOUS

## 2017-06-13 MED ORDER — DIPHENHYDRAMINE HCL 50 MG/ML IJ SOLN
INTRAMUSCULAR | Status: AC
  Filled 2017-06-13: qty 1

## 2017-06-13 MED ORDER — SODIUM CHLORIDE 0.9 % IJ SOLN
10.0000 mL | INTRAMUSCULAR | Status: DC | PRN
  Administered 2017-06-13: 10 mL via INTRAVENOUS
  Filled 2017-06-13: qty 10

## 2017-06-13 MED ORDER — PALONOSETRON HCL INJECTION 0.25 MG/5ML
INTRAVENOUS | Status: AC
  Filled 2017-06-13: qty 5

## 2017-06-13 MED ORDER — HEPARIN SOD (PORK) LOCK FLUSH 100 UNIT/ML IV SOLN
500.0000 [IU] | Freq: Once | INTRAVENOUS | Status: AC | PRN
  Administered 2017-06-13: 500 [IU]
  Filled 2017-06-13: qty 5

## 2017-06-13 MED ORDER — DIPHENHYDRAMINE HCL 50 MG/ML IJ SOLN
50.0000 mg | Freq: Once | INTRAMUSCULAR | Status: AC
  Administered 2017-06-13: 50 mg via INTRAVENOUS

## 2017-06-13 MED ORDER — FAMOTIDINE IN NACL 20-0.9 MG/50ML-% IV SOLN
20.0000 mg | Freq: Two times a day (BID) | INTRAVENOUS | Status: DC
Start: 2017-06-13 — End: 2017-06-13
  Administered 2017-06-13: 20 mg via INTRAVENOUS

## 2017-06-13 MED ORDER — DEXAMETHASONE SODIUM PHOSPHATE 10 MG/ML IJ SOLN
4.0000 mg | Freq: Once | INTRAMUSCULAR | Status: AC
  Administered 2017-06-13: 4 mg via INTRAVENOUS

## 2017-06-13 MED ORDER — SODIUM CHLORIDE 0.9% FLUSH
10.0000 mL | INTRAVENOUS | Status: DC | PRN
  Administered 2017-06-13: 10 mL
  Filled 2017-06-13: qty 10

## 2017-06-13 MED ORDER — FAMOTIDINE IN NACL 20-0.9 MG/50ML-% IV SOLN
INTRAVENOUS | Status: AC
  Filled 2017-06-13: qty 50

## 2017-06-13 MED ORDER — SODIUM CHLORIDE 0.9 % IV SOLN
570.0000 mg | Freq: Once | INTRAVENOUS | Status: AC
  Administered 2017-06-13: 570 mg via INTRAVENOUS
  Filled 2017-06-13: qty 57

## 2017-06-13 MED ORDER — SODIUM CHLORIDE 0.9 % IV SOLN
Freq: Once | INTRAVENOUS | Status: AC
  Administered 2017-06-13: 10:00:00 via INTRAVENOUS

## 2017-06-13 NOTE — Progress Notes (Signed)
Hematology and Oncology Follow Up Visit  [name] [id number] 05/09/1955 62 y.o. 06/13/2017 9:14 AM    Principle Diagnosis: 62 year old woman with:  1. 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    2. 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 The tumor is ER positive PR negative, HER-2 negative.   Prior Therapy:  Carboplatin and Taxotere started on 10/01/2012. Avastin was added with cycle 4. Chemotherapy only discontinued in June of 2015.  She is S/P Avastin maintenance only between June 2015 till March 2017. Therapy discontinued due to progression of disease.  She is status post Doxil salvage chemotherapy therapy discontinued because of hypersensitivity reaction in March 2017.  Current therapy:  Carboplatin and AUC of 5 every 3 weeks cycle 1 started on 07/08/2015.   Interim History: [name] is here for a follow-up visit.  She does not report any recent changes in her health since her last chemotherapy.  She continues to tolerate this treatment was reasonable quality of life and performance status.  She continues to have issues with unsteadiness and recent falls.  She has been taking less of Neurontin which have helped her unsteadiness issues.  He is no longer reporting any worsening neuropathy.  She does not report any bleeding or recurrent infections.  She denies any hospitalizations or illnesses.  Her performance status and quality of life remain unchanged.  She did have an issue of constipation that resolved after the last cycle of chemotherapy.   She does not report any headaches, blurry vision, syncope or seizures. She denied any fever, chills or sweats.  No changes in her appetite or weight.  She does not report any chest pain, palpitation, orthopnea or leg edema.  Has not reported any shortness of breath or cough. She does not report any vomiting,  hematochezia.   She does not report any hematuria or dysuria.  She does not report any heat or cold intolerance.  She does not report any arthralgias or myalgias.  She does not report any lymphadenopathy or petechiae.  She denied any worsening anxiety or depression.  Remainder of the review of systems is negative.   Medications: I have reviewed the patient's current medications.  Current Outpatient Medications  Medication Sig Dispense Refill  . acetaminophen (TYLENOL) 325 MG tablet Take 2 tablets (650 mg total) by mouth every 4 (four) hours as needed for mild pain, fever or headache. 30 tablet 0  . Acetylcarnitine HCl (ACETYL L-CARNITINE PO) Take 2,000 mg by mouth at bedtime.    . ALPRAZolam (XANAX) 0.5 MG tablet Take 0.5 mg by mouth 2 (two) times daily as needed for anxiety.    Marland Kitchen aspirin 81 MG chewable tablet Chew 1 tablet (81 mg total) by mouth daily. 30 tablet 0  . atorvastatin (LIPITOR) 40 MG tablet Take 1 tablet (40 mg total) by mouth daily. 90 tablet 2  . carvedilol (COREG) 3.125 MG tablet Take 1/2 tablet by mouth twice daily with meals. 30 tablet 9  . clotrimazole-betamethasone (LOTRISONE) cream Apply 1 application topically 2 (two) times daily. 30 g 1  . divalproex (DEPAKOTE) 250 MG DR tablet Take by mouth. Take '500mg'$ s in the morning and '750mg'$ s at night    . fluticasone (FLONASE) 50 MCG/ACT nasal spray INHALE 2 SPRAYS INTO EACH NOSTRIL EVERY NIGHT  11  . gabapentin (NEURONTIN) 100 MG capsule Take 1 capsule by mouth twice daily. 180 capsule 2  . lamoTRIgine (LAMICTAL)  100 MG tablet Take 100 mg by mouth 2 (two) times daily.    Marland Kitchen levothyroxine (SYNTHROID, LEVOTHROID) 100 MCG tablet Take 1 tablet (100 mcg total) by mouth daily. 90 tablet 2  . lidocaine-prilocaine (EMLA) cream Apply topically as needed. Apply to port with every chemotherapy. 30 g 0  . loperamide (IMODIUM A-D) 2 MG tablet Take 2 mg by mouth as needed for diarrhea or loose stools.    Marland Kitchen loratadine (CLARITIN) 10 MG tablet Take 10  mg by mouth daily as needed for allergies (with chemo).    . nitroGLYCERIN (NITROSTAT) 0.4 MG SL tablet Place 1 tablet (0.4 mg total) under the tongue every 5 (five) minutes as needed for chest pain. 25 tablet 3  . ondansetron (ZOFRAN ODT) 8 MG disintegrating tablet Take 1 tablet (8 mg total) by mouth every 8 (eight) hours as needed for nausea or vomiting. 20 tablet 1  . ondansetron (ZOFRAN) 8 MG tablet TAKE 1 TABLET BY MOUTH EVERY 8 HOURS AS NEEDED FOR NAUSEA AND VOMITING 30 tablet 2  . pantoprazole (PROTONIX) 40 MG tablet Take 1 tablet (40 mg total) by mouth daily. 90 tablet 1  . prochlorperazine (COMPAZINE) 10 MG tablet Take 1 tablet (10 mg total) by mouth every 6 (six) hours as needed for nausea or vomiting. 30 tablet 0  . rOPINIRole (REQUIP) 0.5 MG tablet Take 3 tablets by mouth at bedtime. 90 tablet 4  . sertraline (ZOLOFT) 50 MG tablet      No current facility-administered medications for this visit.    Facility-Administered Medications Ordered in Other Visits  Medication Dose Route Frequency Provider Last Rate Last Dose  . heparin lock flush 100 unit/mL  500 Units Intravenous Once Phillipe Clemon, Roxy Cedar N, MD      . sodium chloride 0.9 % injection 10 mL  10 mL Intravenous PRN Wyatt Portela, MD   10 mL at 06/13/17 0904  . sodium chloride flush (NS) 0.9 % injection 10 mL  10 mL Intravenous PRN Wyatt Portela, MD         Allergies:  Allergies  Allergen Reactions  . Doxil [Doxorubicin Hcl Liposomal] Anaphylaxis    1st Doxil.   . Pollen Extract Other (See Comments)    Pollen and grass causes a lot sneezing    Past Medical History, Surgical history, Social history, and Family History reviewed and unchanged.    Physical Exam: Blood pressure (!) 129/54, pulse (!) 57, temperature 98.1 F (36.7 C), temperature source Oral, resp. rate 18, height '5\' 1"'$  (1.549 m), weight 153 lb 1.6 oz (69.4 kg), SpO2 100 %.    ECOG: 1 General appearance: Alert, awake without distress. Head: Normocephalic  without any masses or lesions. Oral mucosa: Mucous membranes are moist and pink. Eyes: Pupils are equal and round reactive to light. Lymph nodes: Cervical, axillary and supraclavicular regions are without adenopathy. Heart: Regular Rate without any murmurs or gallops. Lung: Clear in all lung fields without any wheezes or dullness to percussion. Abdomin: Soft, nondistended without any rebound or guarding. Musculoskeletal: Full range of motion noted all joints. Skin: No petechia or ecchymosis. Neurological: No motor or sensory deficits. Psychiatric: Affect remains appropriate.  Lab Results: Lab Results  Component Value Date   WBC 1.9 (L) 05/22/2017   HGB 8.8 (L) 05/22/2017   HCT 28.0 (L) 05/22/2017   MCV 100.4 05/22/2017   PLT 54 (L) 05/22/2017     Chemistry      Component Value Date/Time   NA 143 05/22/2017 9528  NA 142 01/21/2017 0822   K 4.0 05/22/2017 0928   K 4.1 01/21/2017 0822   CL 110 (H) 05/22/2017 0928   CO2 26 05/22/2017 0928   CO2 27 01/21/2017 0822   BUN 12 05/22/2017 0928   BUN 12.2 01/21/2017 0822   CREATININE 0.67 05/22/2017 0928   CREATININE 0.7 01/21/2017 0822      Component Value Date/Time   CALCIUM 8.9 05/22/2017 0928   CALCIUM 8.9 01/21/2017 0822   ALKPHOS 79 05/22/2017 0928   ALKPHOS 75 01/21/2017 0822   AST 16 05/22/2017 0928   AST 12 01/21/2017 0822   ALT 9 05/22/2017 0928   ALT 7 01/21/2017 0822   BILITOT 0.2 05/22/2017 0928   BILITOT 0.23 01/21/2017 0822      Results for AZZIE, THIEM (MRN [id number]) as of 06/13/2017 09:14  Ref. Range 04/10/2017 08:15 05/01/2017 09:16 05/22/2017 09:28  Cancer Antigen (CA) 125 Latest Ref Range: 0.0 - 38.1 U/mL 39.3 (H) 29.7 26.8      Impression and Plan:  62 year old woman with the following issues:  1.  Ovarian cancer with peritoneal carcinomatosis diagnosed in 2014.  She is status post therapy outlined above.  She remains on carboplatin single agent at AUC of 5 with excellent disease control.   She reports no complications related to this medication at this time.  Risks and benefits of long-term complications associated with this therapy was discussed including cytopenias and recurrent infections.  After discussion today she is agreeable to continue at this time.  2. IV access: Port-A-Cath remains in use without any complications.  3. Thrombocytopenia: Related to chemotherapy and chronic liver disease.  No active bleeding noted.  4. Left breast cancer: No definitive local therapy has been pursued given her ovarian cancer.  Her disease under control with systemic chemotherapy.   5. Neutropenia: She remains on growth factor support after each cycle of chemotherapy.  No fevers noted at this time.  6. Nausea: Managed adequately with antiemetics.  7.  Unsteadiness: I asked her to discontinue Neurontin which could be contributing to her unsteadiness.  8. Followup: She will follow-up in 3 weeks for chemotherapy infusion.    25  minutes was spent with the patient face-to-face today.  More than 50% of time was dedicated to patient counseling, education and coordinating her multifaceted care.   Zola Button, MD 4/25/20199:14 AM

## 2017-06-13 NOTE — Patient Instructions (Signed)
[facility] Discharge Instructions for Patients Receiving Chemotherapy  Today you received the following chemotherapy agents: Carboplatin (Paraplatin)  To help prevent nausea and vomiting after your treatment, we encourage you to take your nausea medication as prescribed. If you develop nausea and vomiting that is not controlled by your nausea medication, call the clinic.   BELOW ARE SYMPTOMS THAT SHOULD BE REPORTED IMMEDIATELY:  *FEVER GREATER THAN 100.5 F  *CHILLS WITH OR WITHOUT FEVER  NAUSEA AND VOMITING THAT IS NOT CONTROLLED WITH YOUR NAUSEA MEDICATION  *UNUSUAL SHORTNESS OF BREATH  *UNUSUAL BRUISING OR BLEEDING  TENDERNESS IN MOUTH AND THROAT WITH OR WITHOUT PRESENCE OF ULCERS  *URINARY PROBLEMS  *BOWEL PROBLEMS  UNUSUAL RASH Items with * indicate a potential emergency and should be followed up as soon as possible.  Feel free to call the clinic should you have any questions or concerns. The clinic phone number is [phone].  Please show the CHEMO ALERT CARD at check-in to the Emergency Department and triage nurse.   

## 2017-06-13 NOTE — Progress Notes (Signed)
Per Dr. Alen Blew: OK to treat with Plts 49 and ANC 0.9

## 2017-06-14 ENCOUNTER — Inpatient Hospital Stay: Payer: PPO

## 2017-06-14 DIAGNOSIS — Z5189 Encounter for other specified aftercare: Secondary | ICD-10-CM | POA: Diagnosis not present

## 2017-06-14 DIAGNOSIS — C482 Malignant neoplasm of peritoneum, unspecified: Secondary | ICD-10-CM

## 2017-06-14 LAB — CA 125: Cancer Antigen (CA) 125: 24 U/mL (ref 0.0–38.1)

## 2017-06-14 MED ORDER — PEGFILGRASTIM INJECTION 6 MG/0.6ML ~~LOC~~
6.0000 mg | PREFILLED_SYRINGE | Freq: Once | SUBCUTANEOUS | Status: AC
  Administered 2017-06-14: 6 mg via SUBCUTANEOUS

## 2017-06-14 MED ORDER — PEGFILGRASTIM INJECTION 6 MG/0.6ML ~~LOC~~
PREFILLED_SYRINGE | SUBCUTANEOUS | Status: AC
  Filled 2017-06-14: qty 0.6

## 2017-06-14 NOTE — Patient Instructions (Signed)
Pegfilgrastim injection What is this medicine? PEGFILGRASTIM (PEG fil gra stim) is a long-acting granulocyte colony-stimulating factor that stimulates the growth of neutrophils, a type of white blood cell important in the body's fight against infection. It is used to reduce the incidence of fever and infection in patients with certain types of cancer who are receiving chemotherapy that affects the bone marrow, and to increase survival after being exposed to high doses of radiation. This medicine may be used for other purposes; ask your health care provider or pharmacist if you have questions. COMMON BRAND NAME(S): Neulasta What should I tell my health care provider before I take this medicine? They need to know if you have any of these conditions: -kidney disease -latex allergy -ongoing radiation therapy -sickle cell disease -skin reactions to acrylic adhesives (On-Body Injector only) -an unusual or allergic reaction to pegfilgrastim, filgrastim, other medicines, foods, dyes, or preservatives -pregnant or trying to get pregnant -breast-feeding How should I use this medicine? This medicine is for injection under the skin. If you get this medicine at home, you will be taught how to prepare and give the pre-filled syringe or how to use the On-body Injector. Refer to the patient Instructions for Use for detailed instructions. Use exactly as directed. Tell your healthcare provider immediately if you suspect that the On-body Injector may not have performed as intended or if you suspect the use of the On-body Injector resulted in a missed or partial dose. It is important that you put your used needles and syringes in a special sharps container. Do not put them in a trash can. If you do not have a sharps container, call your pharmacist or healthcare provider to get one. Talk to your pediatrician regarding the use of this medicine in children. While this drug may be prescribed for selected conditions,  precautions do apply. Overdosage: If you think you have taken too much of this medicine contact a poison control center or emergency room at once. NOTE: This medicine is only for you. Do not share this medicine with others. What if I miss a dose? It is important not to miss your dose. Call your doctor or health care professional if you miss your dose. If you miss a dose due to an On-body Injector failure or leakage, a new dose should be administered as soon as possible using a single prefilled syringe for manual use. What may interact with this medicine? Interactions have not been studied. Give your health care provider a list of all the medicines, herbs, non-prescription drugs, or dietary supplements you use. Also tell them if you smoke, drink alcohol, or use illegal drugs. Some items may interact with your medicine. This list may not describe all possible interactions. Give your health care provider a list of all the medicines, herbs, non-prescription drugs, or dietary supplements you use. Also tell them if you smoke, drink alcohol, or use illegal drugs. Some items may interact with your medicine. What should I watch for while using this medicine? You may need blood work done while you are taking this medicine. If you are going to need a MRI, CT scan, or other procedure, tell your doctor that you are using this medicine (On-Body Injector only). What side effects may I notice from receiving this medicine? Side effects that you should report to your doctor or health care professional as soon as possible: -allergic reactions like skin rash, itching or hives, swelling of the face, lips, or tongue -dizziness -fever -pain, redness, or irritation at site   where injected -pinpoint red spots on the skin -red or dark-brown urine -shortness of breath or breathing problems -stomach or side pain, or pain at the shoulder -swelling -tiredness -trouble passing urine or change in the amount of urine Side  effects that usually do not require medical attention (report to your doctor or health care professional if they continue or are bothersome): -bone pain -muscle pain This list may not describe all possible side effects. Call your doctor for medical advice about side effects. You may report side effects to FDA at 1-800-FDA-1088. Where should I keep my medicine? Keep out of the reach of children. Store pre-filled syringes in a refrigerator between 2 and 8 degrees C (36 and 46 degrees F). Do not freeze. Keep in carton to protect from light. Throw away this medicine if it is left out of the refrigerator for more than 48 hours. Throw away any unused medicine after the expiration date. NOTE: This sheet is a summary. It may not cover all possible information. If you have questions about this medicine, talk to your doctor, pharmacist, or health care provider.  2018 Elsevier/Gold Standard (2016-02-02 12:58:03)  

## 2017-06-17 ENCOUNTER — Other Ambulatory Visit: Payer: Self-pay | Admitting: *Deleted

## 2017-06-17 DIAGNOSIS — C786 Secondary malignant neoplasm of retroperitoneum and peritoneum: Secondary | ICD-10-CM

## 2017-06-17 DIAGNOSIS — C482 Malignant neoplasm of peritoneum, unspecified: Secondary | ICD-10-CM

## 2017-06-17 MED ORDER — LIDOCAINE-PRILOCAINE 2.5-2.5 % EX CREA: TOPICAL_CREAM | CUTANEOUS | 2 refills | Status: AC | PRN

## 2017-06-18 ENCOUNTER — Telehealth: Payer: Self-pay | Admitting: *Deleted

## 2017-06-18 DIAGNOSIS — F3131 Bipolar disorder, current episode depressed, mild: Secondary | ICD-10-CM | POA: Diagnosis not present

## 2017-06-18 NOTE — Telephone Encounter (Signed)
Requesting refill of Betamethasone DP Lot 0.05%

## 2017-06-19 DIAGNOSIS — H35363 Drusen (degenerative) of macula, bilateral: Secondary | ICD-10-CM | POA: Diagnosis not present

## 2017-06-19 DIAGNOSIS — D3122 Benign neoplasm of left retina: Secondary | ICD-10-CM | POA: Diagnosis not present

## 2017-06-19 DIAGNOSIS — H524 Presbyopia: Secondary | ICD-10-CM | POA: Diagnosis not present

## 2017-06-25 ENCOUNTER — Telehealth: Payer: Self-pay | Admitting: *Deleted

## 2017-06-25 ENCOUNTER — Other Ambulatory Visit: Payer: Self-pay | Admitting: Family Medicine

## 2017-06-25 DIAGNOSIS — L298 Other pruritus: Secondary | ICD-10-CM

## 2017-06-25 MED ORDER — CLOTRIMAZOLE-BETAMETHASONE 1-0.05 % EX CREA: 1.0000 "application " | TOPICAL_CREAM | Freq: Two times a day (BID) | CUTANEOUS | 1 refills | Status: AC

## 2017-06-25 NOTE — Telephone Encounter (Signed)
Patient informed that medication was sent to pharmacy 

## 2017-06-25 NOTE — Telephone Encounter (Signed)
Rx for Lotrisone was sent to her pharmacy. Thanks, BJ

## 2017-06-25 NOTE — Telephone Encounter (Signed)
[place], states she has fallen again, d/t her balance being "off". States she fell after easter and is getting concerned. She did not go to the E.R. After falling. States her psychiatrist  Did not want to decrease any of her medications, but suggested she may need a brain MRI. Patient very concerned. Encouraged her to keep appt with dr Alen Blew on 07/03/17 and discuss options.

## 2017-06-27 ENCOUNTER — Encounter: Payer: Self-pay | Admitting: *Deleted

## 2017-07-01 DIAGNOSIS — G4733 Obstructive sleep apnea (adult) (pediatric): Secondary | ICD-10-CM | POA: Diagnosis not present

## 2017-07-03 ENCOUNTER — Encounter: Payer: Self-pay | Admitting: Family Medicine

## 2017-07-03 ENCOUNTER — Inpatient Hospital Stay: Payer: PPO

## 2017-07-03 ENCOUNTER — Inpatient Hospital Stay: Payer: PPO | Attending: Oncology

## 2017-07-03 ENCOUNTER — Telehealth: Payer: Self-pay | Admitting: Oncology

## 2017-07-03 ENCOUNTER — Inpatient Hospital Stay (HOSPITAL_BASED_OUTPATIENT_CLINIC_OR_DEPARTMENT_OTHER): Payer: PPO | Admitting: Oncology

## 2017-07-03 DIAGNOSIS — D696 Thrombocytopenia, unspecified: Secondary | ICD-10-CM | POA: Insufficient documentation

## 2017-07-03 DIAGNOSIS — C482 Malignant neoplasm of peritoneum, unspecified: Secondary | ICD-10-CM

## 2017-07-03 DIAGNOSIS — Z7982 Long term (current) use of aspirin: Secondary | ICD-10-CM

## 2017-07-03 DIAGNOSIS — R42 Dizziness and giddiness: Secondary | ICD-10-CM

## 2017-07-03 DIAGNOSIS — C50412 Malignant neoplasm of upper-outer quadrant of left female breast: Secondary | ICD-10-CM

## 2017-07-03 DIAGNOSIS — Z5111 Encounter for antineoplastic chemotherapy: Secondary | ICD-10-CM | POA: Diagnosis not present

## 2017-07-03 DIAGNOSIS — Z5189 Encounter for other specified aftercare: Secondary | ICD-10-CM | POA: Insufficient documentation

## 2017-07-03 DIAGNOSIS — Z853 Personal history of malignant neoplasm of breast: Secondary | ICD-10-CM | POA: Diagnosis not present

## 2017-07-03 DIAGNOSIS — Z79899 Other long term (current) drug therapy: Secondary | ICD-10-CM

## 2017-07-03 DIAGNOSIS — Z923 Personal history of irradiation: Secondary | ICD-10-CM

## 2017-07-03 DIAGNOSIS — Z170 Estrogen receptor positive status [ER+]: Secondary | ICD-10-CM

## 2017-07-03 DIAGNOSIS — D701 Agranulocytosis secondary to cancer chemotherapy: Secondary | ICD-10-CM | POA: Insufficient documentation

## 2017-07-03 DIAGNOSIS — Z95828 Presence of other vascular implants and grafts: Secondary | ICD-10-CM

## 2017-07-03 DIAGNOSIS — R4182 Altered mental status, unspecified: Secondary | ICD-10-CM

## 2017-07-03 DIAGNOSIS — C50919 Malignant neoplasm of unspecified site of unspecified female breast: Secondary | ICD-10-CM

## 2017-07-03 LAB — CMP (CANCER CENTER ONLY)
ALK PHOS: 87 U/L (ref 40–150)
ALT: 10 U/L (ref 0–55)
ANION GAP: 4 (ref 3–11)
AST: 16 U/L (ref 5–34)
Albumin: 3.6 g/dL (ref 3.5–5.0)
BILIRUBIN TOTAL: 0.3 mg/dL (ref 0.2–1.2)
BUN: 12 mg/dL (ref 7–26)
CALCIUM: 8.8 mg/dL (ref 8.4–10.4)
CO2: 31 mmol/L — ABNORMAL HIGH (ref 22–29)
CREATININE: 0.68 mg/dL (ref 0.60–1.10)
Chloride: 107 mmol/L (ref 98–109)
GFR, Estimated: >60 mL/min (ref >60)
Glucose, Bld: 93 mg/dL (ref 70–140)
Potassium: 4.2 mmol/L (ref 3.5–5.1)
SODIUM: 142 mmol/L (ref 136–145)
TOTAL PROTEIN: 7 g/dL (ref 6.4–8.3)

## 2017-07-03 LAB — CBC WITH DIFFERENTIAL (CANCER CENTER ONLY)
BASOS ABS: 0 10*3/uL (ref 0.0–0.1)
BASOS PCT: 0 %
EOS ABS: 0 10*3/uL (ref 0.0–0.5)
Eosinophils Relative: 1 %
HEMATOCRIT: 32.6 % — AB (ref 34.8–46.6)
Hemoglobin: 9.8 g/dL — ABNORMAL LOW (ref 11.6–15.9)
Lymphocytes Relative: 41 %
Lymphs Abs: 1 10*3/uL (ref 0.9–3.3)
MCH: 31.8 pg (ref 25.1–34.0)
MCHC: 30.1 g/dL — ABNORMAL LOW (ref 31.5–36.0)
MCV: 105.8 fL — ABNORMAL HIGH (ref 79.5–101.0)
Monocytes Absolute: 0.3 10*3/uL (ref 0.1–0.9)
Monocytes Relative: 11 %
NEUTROS ABS: 1.2 10*3/uL — AB (ref 1.5–6.5)
NEUTROS PCT: 47 %
PLATELETS: 47 10*3/uL — AB (ref 145–400)
RBC: 3.08 MIL/uL — ABNORMAL LOW (ref 3.70–5.45)
RDW: 15.2 % — ABNORMAL HIGH (ref 11.2–14.5)
WBC Count: 2.5 10*3/uL — ABNORMAL LOW (ref 3.9–10.3)

## 2017-07-03 MED ORDER — HEPARIN SOD (PORK) LOCK FLUSH 100 UNIT/ML IV SOLN
500.0000 [IU] | Freq: Once | INTRAVENOUS | Status: AC | PRN
  Administered 2017-07-03: 500 [IU]
  Filled 2017-07-03: qty 5

## 2017-07-03 MED ORDER — SODIUM CHLORIDE 0.9 % IV SOLN
570.0000 mg | Freq: Once | INTRAVENOUS | Status: AC
  Administered 2017-07-03: 570 mg via INTRAVENOUS
  Filled 2017-07-03: qty 57

## 2017-07-03 MED ORDER — SODIUM CHLORIDE 0.9 % IJ SOLN
10.0000 mL | INTRAMUSCULAR | Status: DC | PRN
  Administered 2017-07-03: 10 mL via INTRAVENOUS
  Filled 2017-07-03: qty 10

## 2017-07-03 MED ORDER — FAMOTIDINE IN NACL 20-0.9 MG/50ML-% IV SOLN
INTRAVENOUS | Status: AC
  Filled 2017-07-03: qty 50

## 2017-07-03 MED ORDER — SODIUM CHLORIDE 0.9% FLUSH
10.0000 mL | INTRAVENOUS | Status: DC | PRN
  Administered 2017-07-03: 10 mL
  Filled 2017-07-03: qty 10

## 2017-07-03 MED ORDER — PALONOSETRON HCL INJECTION 0.25 MG/5ML
0.2500 mg | Freq: Once | INTRAVENOUS | Status: AC
  Administered 2017-07-03: 0.25 mg via INTRAVENOUS

## 2017-07-03 MED ORDER — DEXAMETHASONE SODIUM PHOSPHATE 10 MG/ML IJ SOLN
4.0000 mg | Freq: Once | INTRAMUSCULAR | Status: AC
  Administered 2017-07-03: 4 mg via INTRAVENOUS

## 2017-07-03 MED ORDER — DEXAMETHASONE SODIUM PHOSPHATE 10 MG/ML IJ SOLN
INTRAMUSCULAR | Status: AC
  Filled 2017-07-03: qty 1

## 2017-07-03 MED ORDER — DIPHENHYDRAMINE HCL 50 MG/ML IJ SOLN
INTRAMUSCULAR | Status: AC
  Filled 2017-07-03: qty 1

## 2017-07-03 MED ORDER — FAMOTIDINE IN NACL 20-0.9 MG/50ML-% IV SOLN
20.0000 mg | Freq: Once | INTRAVENOUS | Status: AC
  Administered 2017-07-03: 20 mg via INTRAVENOUS

## 2017-07-03 MED ORDER — DIPHENHYDRAMINE HCL 50 MG/ML IJ SOLN
50.0000 mg | Freq: Once | INTRAMUSCULAR | Status: AC
  Administered 2017-07-03: 50 mg via INTRAVENOUS

## 2017-07-03 MED ORDER — SODIUM CHLORIDE 0.9 % IV SOLN
Freq: Once | INTRAVENOUS | Status: AC
  Administered 2017-07-03: 13:00:00 via INTRAVENOUS

## 2017-07-03 MED ORDER — PALONOSETRON HCL INJECTION 0.25 MG/5ML
INTRAVENOUS | Status: AC
  Filled 2017-07-03: qty 5

## 2017-07-03 NOTE — Progress Notes (Signed)
Per Dr. Alen Blew, ok to proceed with treatment as ordered with current platelet count.

## 2017-07-03 NOTE — Progress Notes (Signed)
Per Dr. Alen Blew, it's OK to treat today with a neutrophil count of 1.2. Treating nurse notified.

## 2017-07-03 NOTE — Patient Instructions (Signed)
[facility] Discharge Instructions for Patients Receiving Chemotherapy  Today you received the following chemotherapy agents Carboplatin  To help prevent nausea and vomiting after your treatment, we encourage you to take your nausea medication as directed   If you develop nausea and vomiting that is not controlled by your nausea medication, call the clinic.   BELOW ARE SYMPTOMS THAT SHOULD BE REPORTED IMMEDIATELY:  *FEVER GREATER THAN 100.5 F  *CHILLS WITH OR WITHOUT FEVER  NAUSEA AND VOMITING THAT IS NOT CONTROLLED WITH YOUR NAUSEA MEDICATION  *UNUSUAL SHORTNESS OF BREATH  *UNUSUAL BRUISING OR BLEEDING  TENDERNESS IN MOUTH AND THROAT WITH OR WITHOUT PRESENCE OF ULCERS  *URINARY PROBLEMS  *BOWEL PROBLEMS  UNUSUAL RASH Items with * indicate a potential emergency and should be followed up as soon as possible.  Feel free to call the clinic should you have any questions or concerns. The clinic phone number is [phone].  Please show the CHEMO ALERT CARD at check-in to the Emergency Department and triage nurse.   

## 2017-07-03 NOTE — Telephone Encounter (Signed)
Scheduled appt per 5/15 los - pt aware of appt date and time - my chart active - no print out wanted per patient request.

## 2017-07-03 NOTE — Progress Notes (Signed)
Hematology and Oncology Follow Up Visit  [name] [id number] 23-Oct-1955 61 y.o. 07/03/2017 12:13 PM    Principle Diagnosis: 62 year old woman with:  1. 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      2. 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 The tumor is ER positive PR negative, HER-2 negative.   Prior Therapy:  Carboplatin and Taxotere started on 10/01/2012. Avastin was added with cycle 4. Chemotherapy only discontinued in June of 2015.  She is S/P Avastin maintenance only between June 2015 till March 2017. Therapy discontinued due to progression of disease.  She is status post Doxil salvage chemotherapy therapy discontinued because of hypersensitivity reaction in March 2017.  Current therapy:  Carboplatin and AUC of 5 every 3 weeks cycle 1 started on 07/08/2015.  She is here for the next cycle of therapy.  Interim History: [name] presents today for a follow-up.  Since the last visit, she continues to have issues with dizziness and unsteadiness and have fell twice.  She reports no seizure activities or alteration of mental status but does report unsteadiness and occasional headaches.  She denies any complications related to chemotherapy including nausea, vomiting or peripheral neuropathy.  Her appetite and performance status remain unchanged.  She denies any active bleeding including hematochezia or melena.  She does not report any headaches, blurry vision, syncope or seizures.  She denies any appetite changes although her weight is down.  She denied any fever, chills or sweats.  She does not report any chest pain, palpitation, orthopnea or leg edema.  Has not reported any shortness of breath or cough. She does not report any vomiting, hematochezia.   She does not report any hematuria or dysuria. She does not report any bone pain or pathological  fractures.  She does not report any lymphadenopathy or petechiae.  She denied any worsening anxiety or depression.  Remainder of the review of systems is negative.   Medications: I have reviewed the patient's current medications.  Current Outpatient Medications  Medication Sig Dispense Refill  . acetaminophen (TYLENOL) 325 MG tablet Take 2 tablets (650 mg total) by mouth every 4 (four) hours as needed for mild pain, fever or headache. 30 tablet 0  . Acetylcarnitine HCl (ACETYL L-CARNITINE PO) Take 2,000 mg by mouth at bedtime.    . ALPRAZolam (XANAX) 0.5 MG tablet Take 0.5 mg by mouth 2 (two) times daily as needed for anxiety.    Marland Kitchen aspirin 81 MG chewable tablet Chew 1 tablet (81 mg total) by mouth daily. 30 tablet 0  . atorvastatin (LIPITOR) 40 MG tablet Take 1 tablet (40 mg total) by mouth daily. 90 tablet 2  . carvedilol (COREG) 3.125 MG tablet Take 1/2 tablet by mouth twice daily with meals. 30 tablet 9  . clotrimazole-betamethasone (LOTRISONE) cream Apply 1 application topically 2 (two) times daily. 30 g 1  . divalproex (DEPAKOTE) 250 MG DR tablet Take by mouth. Take '500mg'$ s in the morning and '750mg'$ s at night    . fluticasone (FLONASE) 50 MCG/ACT nasal spray INHALE 2 SPRAYS INTO EACH NOSTRIL EVERY NIGHT  11  . gabapentin (NEURONTIN) 100 MG capsule Take 1 capsule by mouth twice daily. 180 capsule 2  . lamoTRIgine (LAMICTAL) 100 MG tablet Take 100 mg by mouth 2 (two) times daily.    Marland Kitchen levothyroxine (SYNTHROID, LEVOTHROID) 100 MCG tablet Take 1 tablet (100 mcg total) by mouth daily. 90 tablet  2  . lidocaine-prilocaine (EMLA) cream Apply topically as needed. Apply to port with every chemotherapy. 30 g 2  . loperamide (IMODIUM A-D) 2 MG tablet Take 2 mg by mouth as needed for diarrhea or loose stools.    Marland Kitchen loratadine (CLARITIN) 10 MG tablet Take 10 mg by mouth daily as needed for allergies (with chemo).    . nitroGLYCERIN (NITROSTAT) 0.4 MG SL tablet Place 1 tablet (0.4 mg total) under the tongue  every 5 (five) minutes as needed for chest pain. 25 tablet 3  . ondansetron (ZOFRAN ODT) 8 MG disintegrating tablet Take 1 tablet (8 mg total) by mouth every 8 (eight) hours as needed for nausea or vomiting. 20 tablet 1  . ondansetron (ZOFRAN) 8 MG tablet TAKE 1 TABLET BY MOUTH EVERY 8 HOURS AS NEEDED FOR NAUSEA AND VOMITING 30 tablet 2  . pantoprazole (PROTONIX) 40 MG tablet Take 1 tablet (40 mg total) by mouth daily. 90 tablet 1  . prochlorperazine (COMPAZINE) 10 MG tablet Take 1 tablet (10 mg total) by mouth every 6 (six) hours as needed for nausea or vomiting. 30 tablet 0  . rOPINIRole (REQUIP) 0.5 MG tablet Take 3 tablets by mouth at bedtime. 90 tablet 4  . sertraline (ZOLOFT) 50 MG tablet      No current facility-administered medications for this visit.    Facility-Administered Medications Ordered in Other Visits  Medication Dose Route Frequency Provider Last Rate Last Dose  . heparin lock flush 100 unit/mL  500 Units Intravenous Once Wyatt Portela, MD      . sodium chloride flush (NS) 0.9 % injection 10 mL  10 mL Intravenous PRN Wyatt Portela, MD         Allergies:  Allergies  Allergen Reactions  . Doxil [Doxorubicin Hcl Liposomal] Anaphylaxis    1st Doxil.   . Pollen Extract Other (See Comments)    Pollen and grass causes a lot sneezing    Past Medical History, Surgical history, Social history, and Family History reviewed and unchanged.    Physical Exam: Blood pressure 129/67, pulse (!) 55, temperature 97.7 F (36.5 C), temperature source Oral, resp. rate 18, height '5\' 1"'$  (1.549 m), weight 149 lb 6.4 oz (67.8 kg), SpO2 100 %.    ECOG: 1 General appearance: Well-appearing woman without distress. Head: Atraumatic without any masses or lesions. Oral mucosa: No thrush or ulcers. Eyes: Sclera anicteric. Lymph nodes: Cervical, axillary and supraclavicular nodes are normal. Heart: Regular Rate without murmurs or gallops. Lung: Clear in all lung fields.  No rhonchi or  wheezes or dullness to percussion Abdomin: Soft, nondistended without any rebound or guarding.  Good bowel sounds in all 4 quadrants. Musculoskeletal: No clubbing or cyanosis. Skin: No ecchymosis or petechiae. Neurological: Intact motor, sensory and deep tendon reflexes. Psychiatric: Appropriate mood and affect.  Lab Results: Lab Results  Component Value Date   WBC 2.5 (L) 07/03/2017   HGB 9.8 (L) 07/03/2017   HCT 32.6 (L) 07/03/2017   MCV 105.8 (H) 07/03/2017   PLT 47 (L) 07/03/2017     Chemistry      Component Value Date/Time   NA 142 06/13/2017 0846   NA 142 01/21/2017 0822   K 3.5 06/13/2017 0846   K 4.1 01/21/2017 0822   CL 109 06/13/2017 0846   CO2 26 06/13/2017 0846   CO2 27 01/21/2017 0822   BUN 7 06/13/2017 0846   BUN 12.2 01/21/2017 0822   CREATININE 0.66 06/13/2017 0846   CREATININE 0.7 01/21/2017  0177      Component Value Date/Time   CALCIUM 8.8 06/13/2017 0846   CALCIUM 8.9 01/21/2017 0822   ALKPHOS 74 06/13/2017 0846   ALKPHOS 75 01/21/2017 0822   AST 16 06/13/2017 0846   AST 12 01/21/2017 0822   ALT 9 06/13/2017 0846   ALT 7 01/21/2017 0822   BILITOT 0.2 06/13/2017 0846   BILITOT 0.23 01/21/2017 0822       Results for [name], [name] (MRN [id number]) as of 07/03/2017 12:01  Ref. Range 05/01/2017 09:16 05/22/2017 09:28 06/13/2017 08:46  Cancer Antigen (CA) 125 Latest Ref Range: 0.0 - 38.1 U/mL 29.7 26.8 24.0      Impression and Plan:  62 year old woman with the following issues:  1.  Peritoneal carcinomatosis diagnosed in 2014 arising from a GYN source including ovarian and coelomic epithelium.  She had excellent response to carboplatin therapy without any complications noted.  Her CA 125 continues to show excellent response without any signs or symptoms to suggest progression of disease.  I recommended continuing the same dose and schedule at this time.  2. IV access: Port-A-Cath in use without any issues or complications.  3.  Thrombocytopenia: No bleeding noted at this time.  Platelet count remains stable.  Thrombocytopenia is related to chemotherapy and chronic liver disease.  No active bleeding noted.  4. Left breast cancer: No local therapy indicated at this time.  This will continue to be monitored clinically and local therapy will be indicated if symptoms arise.   5. Neutropenia: She receives growth factor support after each chemotherapy infusion.  6.  Dizziness, falls and unsteadiness: Etiology is unclear but CNS metastasis is considered a possibility.  We will obtain MRI of the brain to rule out metastatic disease to the brain.  7. Followup: She will follow-up in 3 weeks for chemotherapy infusion.    25  minutes was spent with the patient face-to-face today.  More than 50% of time was dedicated to patient counseling, education and answering questions regarding future plan of care.  Zola Button, MD 5/15/201912:13 PM

## 2017-07-04 ENCOUNTER — Inpatient Hospital Stay: Payer: PPO

## 2017-07-04 VITALS — BP 124/60 | HR 57 | Temp 98.1°F | Resp 18

## 2017-07-04 DIAGNOSIS — Z5189 Encounter for other specified aftercare: Secondary | ICD-10-CM | POA: Diagnosis not present

## 2017-07-04 DIAGNOSIS — C482 Malignant neoplasm of peritoneum, unspecified: Secondary | ICD-10-CM

## 2017-07-04 LAB — CA 125: CANCER ANTIGEN (CA) 125: 24.7 U/mL (ref 0.0–38.1)

## 2017-07-04 MED ORDER — PEGFILGRASTIM INJECTION 6 MG/0.6ML ~~LOC~~
6.0000 mg | PREFILLED_SYRINGE | Freq: Once | SUBCUTANEOUS | Status: AC
  Administered 2017-07-04: 6 mg via SUBCUTANEOUS

## 2017-07-04 MED ORDER — PEGFILGRASTIM INJECTION 6 MG/0.6ML ~~LOC~~
PREFILLED_SYRINGE | SUBCUTANEOUS | Status: AC
  Filled 2017-07-04: qty 0.6

## 2017-07-05 ENCOUNTER — Ambulatory Visit: Payer: PPO

## 2017-07-10 DIAGNOSIS — G4733 Obstructive sleep apnea (adult) (pediatric): Secondary | ICD-10-CM | POA: Diagnosis not present

## 2017-07-13 ENCOUNTER — Ambulatory Visit
Admission: RE | Admit: 2017-07-13 | Discharge: 2017-07-13 | Disposition: A | Discharge status: Home / Self Care | Payer: PPO | Source: Ambulatory Visit | Attending: Oncology | Admitting: Oncology

## 2017-07-13 DIAGNOSIS — R4182 Altered mental status, unspecified: Secondary | ICD-10-CM

## 2017-07-13 MED ORDER — GADOBENATE DIMEGLUMINE 529 MG/ML IV SOLN
13.0000 mL | Freq: Once | INTRAVENOUS | Status: AC | PRN
  Administered 2017-07-13: 13 mL via INTRAVENOUS

## 2017-07-17 ENCOUNTER — Other Ambulatory Visit: Payer: Self-pay | Admitting: Oncology

## 2017-07-17 DIAGNOSIS — Z853 Personal history of malignant neoplasm of breast: Secondary | ICD-10-CM

## 2017-07-17 DIAGNOSIS — C50912 Malignant neoplasm of unspecified site of left female breast: Secondary | ICD-10-CM

## 2017-07-18 ENCOUNTER — Ambulatory Visit (INDEPENDENT_AMBULATORY_CARE_PROVIDER_SITE_OTHER): Payer: PPO | Admitting: Family Medicine

## 2017-07-18 ENCOUNTER — Encounter: Payer: Self-pay | Admitting: Family Medicine

## 2017-07-18 VITALS — BP 100/64 | HR 60 | Temp 98.2°F

## 2017-07-18 DIAGNOSIS — M7751 Other enthesopathy of right foot: Secondary | ICD-10-CM | POA: Diagnosis not present

## 2017-07-18 NOTE — Progress Notes (Signed)
Subjective:    Patient ID: [name], female    DOB: 02-20-1955, 62 y.o.   MRN: [id number]  Chief Complaint  Patient presents with  . Foot Pain    Pain in rght heel since Tuesday    HPI Patient was seen today for acute concern. Pt endorses R heel pain.  She denies any injury, feeling any pops or tears.  Pt has tried tylenol last night.  Pt is unsure if it helped as she fell asleep shortly after taking it.  Past Medical History:  Diagnosis Date  . ADD (attention deficit disorder)   . Allergy    seasonal  . Anxiety   . Bipolar disorder ([place])   . Breast cancer ([place]) 1989   right breast  . Cancer ([place])    Omentum  . Coronary artery disease   . Depression   . Hematochezia   . History of echocardiogram    a. Limited Echo 9/17: EF 60-65%, no RWMA, Gr 1 DD, trivial AI, trivial MR, GLS -12% (likely underestimated), no pericardial effusion  . Hyperlipidemia   . Hypothyroidism   . Maintenance chemotherapy    Pt has chemo every 3 weeks (on Friday)  . NSTEMI (non-ST elevated myocardial infarction) ([place]) 10/26/2015  . Osteopenia 03/2009   t score -2.1 FRAX 4.6/0.4  . Premature menopause   . Sleep apnea    mild  . Urinary incontinence     Allergies  Allergen Reactions  . Doxil [Doxorubicin Hcl Liposomal] Anaphylaxis    1st Doxil.   . Pollen Extract Other (See Comments)    Pollen and grass causes a lot sneezing    ROS General: Denies fever, chills, night sweats, changes in weight, changes in appetite HEENT: Denies headaches, ear pain, changes in vision, rhinorrhea, sore throat CV: Denies CP, palpitations, SOB, orthopnea Pulm: Denies SOB, cough, wheezing GI: Denies abdominal pain, nausea, vomiting, diarrhea, constipation GU: Denies dysuria, hematuria, frequency, vaginal discharge Msk: Denies muscle cramps, joint pains  +R heel pain Neuro: Denies weakness, numbness, tingling Skin: Denies rashes, bruising Psych: Denies depression, anxiety, hallucinations    Objective:     Blood pressure 100/64, pulse 60, temperature 98.2 F (36.8 C), temperature source Oral, SpO2 98 %.   Gen. Pleasant, well-nourished, in no distress, normal affect   HEENT: [place]/AT, face symmetric, no scleral icterus, PERRLA, nares patent without drainage, pharynx without erythema or exudate. Neck: No JVD, no thyromegaly, no carotid bruits Lungs: no accessory muscle use Cardiovascular: RRR, no m/r/g, no peripheral edema Musculoskeletal: No deformities, no cyanosis or clubbing, normal tone.  No edema of R ankle noted.  2+ DP and PT pulses.  Mild discomfort with R plantar flexion, TTP of retrocalcaneal bursa.  No TTP of sole of foot. Neuro:  A&Ox3, CN II-XII intact, normal gait Skin:  Warm, no lesions/ rash   Wt Readings from Last 3 Encounters:  07/03/17 149 lb 6.4 oz (67.8 kg)  06/13/17 153 lb 1.6 oz (69.4 kg)  05/22/17 155 lb 4.8 oz (70.4 kg)    Lab Results  Component Value Date   WBC 2.5 (L) 07/03/2017   HGB 9.8 (L) 07/03/2017   HCT 32.6 (L) 07/03/2017   PLT 47 (L) 07/03/2017   GLUCOSE 93 07/03/2017   CHOL 120 04/16/2016   TRIG 153.0 (H) 04/16/2016   HDL 34.90 (L) 04/16/2016   LDLDIRECT 151.3 08/24/2009   LDLCALC 54 04/16/2016   ALT 10 07/03/2017   AST 16 07/03/2017   NA 142 07/03/2017   K 4.2  07/03/2017   CL 107 07/03/2017   CREATININE 0.68 07/03/2017   BUN 12 07/03/2017   CO2 31 (H) 07/03/2017   TSH 1.55 11/06/2016   INR 1.11 10/26/2015   HGBA1C 4.9 01/19/2016   MICROALBUR 1.4 04/23/2016    Assessment/Plan:  Bursitis of right foot  -retrocalcaneal bursitis.  -discussed ice, elevation, rest, can use shoe insert to slightly elevate heel -ok to use ace bandage  -Tylenol for pain/discomfort.  Pt cannot take NSAIDS -will avoid steroids given pt currently on chemo. -xray placed, however discussed not likely to be helpful as not likely a skeletal issue.  Pt will wait on xray. -given handout - Plan: DG Foot Complete Right -f/u prn  Grier Mitts, MD

## 2017-07-18 NOTE — Patient Instructions (Signed)
Bursitis Bursitis is when the fluid-filled sac (bursa) that covers and protects a joint is swollen (inflamed). Bursitis is most common near joints, especially the knees, elbows, hips, and shoulders. Follow these instructions at home:  Take medicines only as told by your doctor.  If you were prescribed an antibiotic medicine, finish it all even if you start to feel better.  Rest the affected area as told by your doctor. ? Keep the area raised up. ? Avoid doing things that make the pain worse.  Apply ice to the injured area: ? Place ice in a plastic bag. ? Place a towel between your skin and the bag. ? Leave the ice on for 20 minutes, 2-3 times a day.  Use splints, braces, pads, or walking aids as told by your doctor.  Keep all follow-up visits as told by your doctor. This is important. Contact a doctor if:  You have more pain with home care.  You have a fever.  You have chills. This information is not intended to replace advice given to you by your health care provider. Make sure you discuss any questions you have with your health care provider. Document Released: 07/26/2009 Document Revised: 07/14/2015 Document Reviewed: 04/27/2013 Elsevier Interactive Patient Education  2018 [place] is when the fluid-filled sac (bursa) that covers and protects a joint is swollen (inflamed). Bursitis is most common near joints, especially the knees, elbows, hips, and shoulders. Follow these instructions at home:  Take medicines only as told by your doctor.  If you were prescribed an antibiotic medicine, finish it all even if you start to feel better.  Rest the affected area as told by your doctor. ? Keep the area raised up. ? Avoid doing things that make the pain worse.  Apply ice to the injured area: ? Place ice in a plastic bag. ? Place a towel between your skin and the bag. ? Leave the ice on for 20 minutes, 2-3 times a day.  Use splints, braces, pads, or  walking aids as told by your doctor.  Keep all follow-up visits as told by your doctor. This is important. Contact a doctor if:  You have more pain with home care.  You have a fever.  You have chills. This information is not intended to replace advice given to you by your health care provider. Make sure you discuss any questions you have with your health care provider. Document Released: 07/26/2009 Document Revised: 07/14/2015 Document Reviewed: 04/27/2013 Elsevier Interactive Patient Education  2018 [employer].

## 2017-07-22 ENCOUNTER — Encounter: Payer: Self-pay | Admitting: Family Medicine

## 2017-07-23 ENCOUNTER — Other Ambulatory Visit: Payer: Self-pay

## 2017-07-23 ENCOUNTER — Inpatient Hospital Stay: Payer: PPO

## 2017-07-23 ENCOUNTER — Encounter: Payer: Self-pay | Admitting: Oncology

## 2017-07-23 ENCOUNTER — Inpatient Hospital Stay: Payer: PPO | Attending: Oncology | Admitting: Oncology

## 2017-07-23 VITALS — BP 116/45 | HR 57 | Temp 98.0°F | Resp 17 | Ht 61.0 in | Wt 147.3 lb

## 2017-07-23 DIAGNOSIS — Z923 Personal history of irradiation: Secondary | ICD-10-CM | POA: Diagnosis not present

## 2017-07-23 DIAGNOSIS — R530 Neoplastic (malignant) related fatigue: Secondary | ICD-10-CM | POA: Diagnosis not present

## 2017-07-23 DIAGNOSIS — Z7982 Long term (current) use of aspirin: Secondary | ICD-10-CM

## 2017-07-23 DIAGNOSIS — Z853 Personal history of malignant neoplasm of breast: Secondary | ICD-10-CM | POA: Diagnosis not present

## 2017-07-23 DIAGNOSIS — C50919 Malignant neoplasm of unspecified site of unspecified female breast: Secondary | ICD-10-CM

## 2017-07-23 DIAGNOSIS — C50412 Malignant neoplasm of upper-outer quadrant of left female breast: Secondary | ICD-10-CM

## 2017-07-23 DIAGNOSIS — D6481 Anemia due to antineoplastic chemotherapy: Secondary | ICD-10-CM | POA: Diagnosis not present

## 2017-07-23 DIAGNOSIS — Z79899 Other long term (current) drug therapy: Secondary | ICD-10-CM

## 2017-07-23 DIAGNOSIS — C482 Malignant neoplasm of peritoneum, unspecified: Secondary | ICD-10-CM

## 2017-07-23 DIAGNOSIS — Z5189 Encounter for other specified aftercare: Secondary | ICD-10-CM | POA: Insufficient documentation

## 2017-07-23 DIAGNOSIS — D701 Agranulocytosis secondary to cancer chemotherapy: Secondary | ICD-10-CM | POA: Diagnosis not present

## 2017-07-23 DIAGNOSIS — Z170 Estrogen receptor positive status [ER+]: Principal | ICD-10-CM

## 2017-07-23 DIAGNOSIS — Z95828 Presence of other vascular implants and grafts: Secondary | ICD-10-CM

## 2017-07-23 DIAGNOSIS — Z5111 Encounter for antineoplastic chemotherapy: Secondary | ICD-10-CM | POA: Diagnosis not present

## 2017-07-23 DIAGNOSIS — D696 Thrombocytopenia, unspecified: Secondary | ICD-10-CM

## 2017-07-23 LAB — CBC WITH DIFFERENTIAL (CANCER CENTER ONLY)
BASOS ABS: 0 10*3/uL (ref 0.0–0.1)
Basophils Relative: 0 %
EOS PCT: 1 %
Eosinophils Absolute: 0 10*3/uL (ref 0.0–0.5)
HCT: 30.8 % — ABNORMAL LOW (ref 34.8–46.6)
Hemoglobin: 9.3 g/dL — ABNORMAL LOW (ref 11.6–15.9)
LYMPHS PCT: 43 %
Lymphs Abs: 1.4 10*3/uL (ref 0.9–3.3)
MCH: 31.8 pg (ref 25.1–34.0)
MCHC: 30.2 g/dL — ABNORMAL LOW (ref 31.5–36.0)
MCV: 105.5 fL — AB (ref 79.5–101.0)
Monocytes Absolute: 0.3 10*3/uL (ref 0.1–0.9)
Monocytes Relative: 9 %
NEUTROS ABS: 1.5 10*3/uL (ref 1.5–6.5)
Neutrophils Relative %: 47 %
PLATELETS: 52 10*3/uL — AB (ref 145–400)
RBC: 2.92 MIL/uL — AB (ref 3.70–5.45)
RDW: 15.6 % — ABNORMAL HIGH (ref 11.2–14.5)
WBC: 3.2 10*3/uL — AB (ref 3.9–10.3)

## 2017-07-23 LAB — CMP (CANCER CENTER ONLY)
AST: 16 U/L (ref 5–34)
Albumin: 3.6 g/dL (ref 3.5–5.0)
Alkaline Phosphatase: 89 U/L (ref 40–150)
Anion gap: 8 (ref 3–11)
BUN: 10 mg/dL (ref 7–26)
CHLORIDE: 107 mmol/L (ref 98–109)
CO2: 27 mmol/L (ref 22–29)
CREATININE: 0.71 mg/dL (ref 0.60–1.10)
Calcium: 8.8 mg/dL (ref 8.4–10.4)
GFR, Est AFR Am: >60 mL/min (ref >60)
GLUCOSE: 93 mg/dL (ref 70–140)
Potassium: 4.1 mmol/L (ref 3.5–5.1)
Sodium: 142 mmol/L (ref 136–145)
Total Bilirubin: 0.3 mg/dL (ref 0.2–1.2)
Total Protein: 7.1 g/dL (ref 6.4–8.3)

## 2017-07-23 MED ORDER — SODIUM CHLORIDE 0.9 % IV SOLN
Freq: Once | INTRAVENOUS | Status: AC
  Administered 2017-07-23: 10:00:00 via INTRAVENOUS

## 2017-07-23 MED ORDER — SODIUM CHLORIDE 0.9 % IV SOLN
570.0000 mg | Freq: Once | INTRAVENOUS | Status: AC
  Administered 2017-07-23: 570 mg via INTRAVENOUS
  Filled 2017-07-23: qty 57

## 2017-07-23 MED ORDER — PALONOSETRON HCL INJECTION 0.25 MG/5ML
0.2500 mg | Freq: Once | INTRAVENOUS | Status: AC
  Administered 2017-07-23: 0.25 mg via INTRAVENOUS

## 2017-07-23 MED ORDER — DIPHENHYDRAMINE HCL 50 MG/ML IJ SOLN
INTRAMUSCULAR | Status: AC
  Filled 2017-07-23: qty 1

## 2017-07-23 MED ORDER — FAMOTIDINE IN NACL 20-0.9 MG/50ML-% IV SOLN
20.0000 mg | Freq: Two times a day (BID) | INTRAVENOUS | Status: DC
  Administered 2017-07-23: 20 mg via INTRAVENOUS

## 2017-07-23 MED ORDER — SODIUM CHLORIDE 0.9% FLUSH
10.0000 mL | INTRAVENOUS | Status: DC | PRN
Start: 2017-07-23 — End: 2017-07-23
  Administered 2017-07-23: 10 mL
  Filled 2017-07-23: qty 10

## 2017-07-23 MED ORDER — DEXAMETHASONE SODIUM PHOSPHATE 10 MG/ML IJ SOLN
INTRAMUSCULAR | Status: AC
  Filled 2017-07-23: qty 1

## 2017-07-23 MED ORDER — FAMOTIDINE IN NACL 20-0.9 MG/50ML-% IV SOLN
INTRAVENOUS | Status: AC
  Filled 2017-07-23: qty 50

## 2017-07-23 MED ORDER — HEPARIN SOD (PORK) LOCK FLUSH 100 UNIT/ML IV SOLN
500.0000 [IU] | Freq: Once | INTRAVENOUS | Status: AC | PRN
  Administered 2017-07-23: 500 [IU]
  Filled 2017-07-23: qty 5

## 2017-07-23 MED ORDER — DEXAMETHASONE SODIUM PHOSPHATE 10 MG/ML IJ SOLN
4.0000 mg | Freq: Once | INTRAMUSCULAR | Status: AC
  Administered 2017-07-23: 4 mg via INTRAVENOUS

## 2017-07-23 MED ORDER — DIPHENHYDRAMINE HCL 50 MG/ML IJ SOLN
50.0000 mg | Freq: Once | INTRAMUSCULAR | Status: AC
  Administered 2017-07-23: 50 mg via INTRAVENOUS

## 2017-07-23 MED ORDER — SODIUM CHLORIDE 0.9 % IJ SOLN
10.0000 mL | INTRAMUSCULAR | Status: DC | PRN
  Administered 2017-07-23: 10 mL via INTRAVENOUS
  Filled 2017-07-23: qty 10

## 2017-07-23 MED ORDER — PALONOSETRON HCL INJECTION 0.25 MG/5ML
INTRAVENOUS | Status: AC
Start: 2017-07-23 — End: ?
  Filled 2017-07-23: qty 5

## 2017-07-23 NOTE — Progress Notes (Signed)
Hematology and Oncology Follow Up Visit  [name] [id number] August 08, 1955 62 y.o. 07/23/2017 12:13 PM    Principle Diagnosis: 62 year old woman with:  1. 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      2. 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 The tumor is ER positive PR negative, HER-2 negative.   Prior Therapy:  Carboplatin and Taxotere started on 10/01/2012. Avastin was added with cycle 4. Chemotherapy only discontinued in June of 2015.  She is S/P Avastin maintenance only between June 2015 till March 2017. Therapy discontinued due to progression of disease.  She is status post Doxil salvage chemotherapy therapy discontinued because of hypersensitivity reaction in March 2017.  Current therapy:  Carboplatin and AUC of 5 every 3 weeks cycle 1 started on 07/08/2015.  She is here for the next cycle of therapy.  Interim History: [name] presents today for a follow-up.  Since the last visit, she has been having issues with right heel bursitis.  She was seen by her primary care provider who advised her to take Tylenol, rest her foot, and use ice as needed.  Patient reports that her pain is improving.  She denies any complications related to chemotherapy including nausea, vomiting or peripheral neuropathy.  Her appetite and performance status remain unchanged.  She denies any active bleeding including hematochezia or melena.  She does not report any headaches, blurry vision, syncope or seizures.  She denies any appetite changes although her weight is down.  She denied any fever, chills or sweats.  She does not report any chest pain, palpitation, orthopnea or leg edema.  Has not reported any shortness of breath or cough. She does not report any vomiting, hematochezia.   She does not report any hematuria or dysuria. She does not report any bone pain or pathological  fractures.  She does not report any lymphadenopathy or petechiae.  She denied any worsening anxiety or depression.  Remainder of the review of systems is negative.  The patient is here for evaluation prior to her next cycle of chemotherapy.  Medications: I have reviewed the patient's current medications.  Current Outpatient Medications  Medication Sig Dispense Refill  . acetaminophen (TYLENOL) 325 MG tablet Take 2 tablets (650 mg total) by mouth every 4 (four) hours as needed for mild pain, fever or headache. 30 tablet 0  . Acetylcarnitine HCl (ACETYL L-CARNITINE PO) Take 2,000 mg by mouth at bedtime.    . ALPRAZolam (XANAX) 0.5 MG tablet Take 0.5 mg by mouth 2 (two) times daily as needed for anxiety.    Marland Kitchen aspirin 81 MG chewable tablet Chew 1 tablet (81 mg total) by mouth daily. 30 tablet 0  . atorvastatin (LIPITOR) 40 MG tablet Take 1 tablet (40 mg total) by mouth daily. 90 tablet 2  . carvedilol (COREG) 3.125 MG tablet Take 1/2 tablet by mouth twice daily with meals. 30 tablet 9  . clotrimazole-betamethasone (LOTRISONE) cream Apply 1 application topically 2 (two) times daily. 30 g 1  . divalproex (DEPAKOTE) 250 MG DR tablet Take by mouth. Take 515ms in the morning and 7510m at night    . fluticasone (FLONASE) 50 MCG/ACT nasal spray INHALE 2 SPRAYS INTO EACH NOSTRIL EVERY NIGHT  11  . lamoTRIgine (LAMICTAL) 100 MG tablet Take 100 mg by mouth 2 (two) times daily.    . Marland Kitchenevothyroxine (SYNTHROID, LEVOTHROID) 100 MCG tablet Take 1 tablet (100 mcg total)  by mouth daily. 90 tablet 2  . lidocaine-prilocaine (EMLA) cream Apply topically as needed. Apply to port with every chemotherapy. 30 g 2  . loratadine (CLARITIN) 10 MG tablet Take 10 mg by mouth daily as needed for allergies (with chemo).    . nitroGLYCERIN (NITROSTAT) 0.4 MG SL tablet Place 1 tablet (0.4 mg total) under the tongue every 5 (five) minutes as needed for chest pain. 25 tablet 3  . ondansetron (ZOFRAN ODT) 8 MG disintegrating tablet  Take 1 tablet (8 mg total) by mouth every 8 (eight) hours as needed for nausea or vomiting. 20 tablet 1  . ondansetron (ZOFRAN) 8 MG tablet TAKE 1 TABLET BY MOUTH EVERY 8 HOURS AS NEEDED FOR NAUSEA AND VOMITING 30 tablet 2  . pantoprazole (PROTONIX) 40 MG tablet Take 1 tablet (40 mg total) by mouth daily. 90 tablet 1  . prochlorperazine (COMPAZINE) 10 MG tablet Take 1 tablet (10 mg total) by mouth every 6 (six) hours as needed for nausea or vomiting. 30 tablet 0  . rOPINIRole (REQUIP) 0.5 MG tablet Take 3 tablets by mouth at bedtime. 90 tablet 4  . sertraline (ZOLOFT) 50 MG tablet     . gabapentin (NEURONTIN) 100 MG capsule Take 1 capsule by mouth twice daily. (Patient not taking: Reported on 07/23/2017) 180 capsule 2  . loperamide (IMODIUM A-D) 2 MG tablet Take 2 mg by mouth as needed for diarrhea or loose stools.     No current facility-administered medications for this visit.    Facility-Administered Medications Ordered in Other Visits  Medication Dose Route Frequency Provider Last Rate Last Dose  . famotidine (PEPCID) IVPB 20 mg premix  20 mg Intravenous Q12H Wyatt Portela, MD   Stopped at 07/23/17 1105  . heparin lock flush 100 unit/mL  500 Units Intravenous Once Wyatt Portela, MD      . sodium chloride flush (NS) 0.9 % injection 10 mL  10 mL Intravenous PRN Wyatt Portela, MD      . sodium chloride flush (NS) 0.9 % injection 10 mL  10 mL Intracatheter PRN Wyatt Portela, MD   10 mL at 07/23/17 1200     Allergies:  Allergies  Allergen Reactions  . Doxil [Doxorubicin Hcl Liposomal] Anaphylaxis    1st Doxil.   . Pollen Extract Other (See Comments)    Pollen and grass causes a lot sneezing    Past Medical History, Surgical history, Social history, and Family History reviewed and unchanged.    Physical Exam: Blood pressure (!) 116/45, pulse (!) 57, temperature 98 F (36.7 C), temperature source Oral, resp. rate 17, height _0  (1.549 m), weight 147 lb 4.8 oz (66.8 kg), SpO2 97  %.    ECOG: 1 General appearance: Well-appearing woman without distress. Head: Atraumatic without any masses or lesions. Oral mucosa: No thrush or ulcers. Eyes: Sclera anicteric. Lymph nodes: Cervical, axillary and supraclavicular nodes are normal. Heart: Regular Rate without murmurs or gallops. Lung: Clear in all lung fields.  No rhonchi or wheezes or dullness to percussion Abdomin: Soft, nondistended without any rebound or guarding.  Good bowel sounds in all 4 quadrants. Musculoskeletal: No clubbing or cyanosis. Skin: No ecchymosis or petechiae. Neurological: Intact motor, sensory and deep tendon reflexes. Psychiatric: Appropriate mood and affect.  Lab Results: Lab Results  Component Value Date   WBC 3.2 (L) 07/23/2017   HGB 9.3 (L) 07/23/2017   HCT 30.8 (L) 07/23/2017   MCV 105.5 (H) 07/23/2017   PLT 52 (L)  07/23/2017     Chemistry      Component Value Date/Time   NA 142 07/23/2017 0930   NA 142 01/21/2017 0822   K 4.1 07/23/2017 0930   K 4.1 01/21/2017 0822   CL 107 07/23/2017 0930   CO2 27 07/23/2017 0930   CO2 27 01/21/2017 0822   BUN 10 07/23/2017 0930   BUN 12.2 01/21/2017 0822   CREATININE 0.71 07/23/2017 0930   CREATININE 0.7 01/21/2017 0822      Component Value Date/Time   CALCIUM 8.8 07/23/2017 0930   CALCIUM 8.9 01/21/2017 0822   ALKPHOS 89 07/23/2017 0930   ALKPHOS 75 01/21/2017 0822   AST 16 07/23/2017 0930   AST 12 01/21/2017 0822   ALT <6 07/23/2017 0930   ALT 7 01/21/2017 0822   BILITOT 0.3 07/23/2017 0930   BILITOT 0.23 01/21/2017 0822      Results for [name], [name] (MRN [id number]) as of 07/23/2017 12:14  Ref. Range 04/10/2017 08:15 05/01/2017 09:16 05/22/2017 09:28 06/13/2017 08:46 07/03/2017 11:09  Cancer Antigen (CA) 125 Latest Ref Range: 0.0 - 38.1 U/mL 39.3 (H) 29.7 26.8 24.0 24.7    Impression and Plan:  62 year old woman with the following issues:  1.  Peritoneal carcinomatosis diagnosed in 2014 arising from a GYN source including  ovarian and coelomic epithelium.  She had excellent response to carboplatin therapy without any complications noted.  Her CA 125 continues to show excellent response without any signs or symptoms to suggest progression of disease.  I recommended continuing the same dose and schedule at this time.  2. IV access: Port-A-Cath in use without any issues or complications.  3. Thrombocytopenia: No bleeding noted at this time.  Platelet count remains stable.  Thrombocytopenia is related to chemotherapy and chronic liver disease.  No active bleeding noted.  4. Left breast cancer: No local therapy indicated at this time.  This will continue to be monitored clinically and local therapy will be indicated if symptoms arise.  5. Neutropenia: She receives growth factor support after each chemotherapy infusion.  6.  Dizziness, falls and unsteadiness: Etiology is unclear but CNS metastasis is considered a possibility.  Recent MRI did not show evidence of metastatic disease to the brain.  Symptoms have resolved.  7. Followup: She will follow-up in 3 weeks for chemotherapy infusion.    Mikey Bussing, DNP, AGPCNP-BC, AOCNP 6/4/201912:13 PM

## 2017-07-23 NOTE — Patient Instructions (Signed)
[facility] Discharge Instructions for Patients Receiving Chemotherapy  Today you received the following chemotherapy agents Carboplatin  To help prevent nausea and vomiting after your treatment, we encourage you to take your nausea medication as directed   If you develop nausea and vomiting that is not controlled by your nausea medication, call the clinic.   BELOW ARE SYMPTOMS THAT SHOULD BE REPORTED IMMEDIATELY:  *FEVER GREATER THAN 100.5 F  *CHILLS WITH OR WITHOUT FEVER  NAUSEA AND VOMITING THAT IS NOT CONTROLLED WITH YOUR NAUSEA MEDICATION  *UNUSUAL SHORTNESS OF BREATH  *UNUSUAL BRUISING OR BLEEDING  TENDERNESS IN MOUTH AND THROAT WITH OR WITHOUT PRESENCE OF ULCERS  *URINARY PROBLEMS  *BOWEL PROBLEMS  UNUSUAL RASH Items with * indicate a potential emergency and should be followed up as soon as possible.  Feel free to call the clinic should you have any questions or concerns. The clinic phone number is [phone].  Please show the CHEMO ALERT CARD at check-in to the Emergency Department and triage nurse.   

## 2017-07-24 ENCOUNTER — Inpatient Hospital Stay: Payer: PPO

## 2017-07-24 ENCOUNTER — Ambulatory Visit: Payer: PPO

## 2017-07-24 DIAGNOSIS — C482 Malignant neoplasm of peritoneum, unspecified: Secondary | ICD-10-CM

## 2017-07-24 DIAGNOSIS — Z5189 Encounter for other specified aftercare: Secondary | ICD-10-CM | POA: Diagnosis not present

## 2017-07-24 LAB — CA 125: Cancer Antigen (CA) 125: 24.9 U/mL (ref 0.0–38.1)

## 2017-07-24 MED ORDER — PEGFILGRASTIM INJECTION 6 MG/0.6ML ~~LOC~~
6.0000 mg | PREFILLED_SYRINGE | Freq: Once | SUBCUTANEOUS | Status: AC
Start: 2017-07-24 — End: 2017-07-24
  Administered 2017-07-24: 6 mg via SUBCUTANEOUS

## 2017-07-24 MED ORDER — PEGFILGRASTIM INJECTION 6 MG/0.6ML ~~LOC~~
PREFILLED_SYRINGE | SUBCUTANEOUS | Status: AC
  Filled 2017-07-24: qty 0.6

## 2017-07-25 ENCOUNTER — Telehealth: Payer: Self-pay | Admitting: Family Medicine

## 2017-07-25 DIAGNOSIS — E039 Hypothyroidism, unspecified: Secondary | ICD-10-CM

## 2017-07-25 NOTE — Telephone Encounter (Signed)
Copied from [place] [id number]. Topic: Quick Communication - See Telephone Encounter >> Jul 25, 2017  9:39 AM Aurelio Brash B wrote: CRM for notification. See Telephone encounter for: 07/25/17.    [name]  from [facility] called about changing manufactures for the pts levothyroxine (SYNTHROID, LEVOTHROID) 100 MCG tablet,  Her contact number is [phone]  option 3

## 2017-07-26 NOTE — Telephone Encounter (Signed)
It is okay to change levothyroxine manufacturer, TSH can be checked 8 weeks after starting new medication.  Thanks, BJ

## 2017-07-26 NOTE — Telephone Encounter (Signed)
[facility] notified and verbalized understanding. Called patient and left message to return call to schedule lab appointment in 8 weeks. Future lab orders entered.

## 2017-07-26 NOTE — Telephone Encounter (Signed)
Dr [name] are you okay with the patient pharmacy changing her Levothyroxine manufacturer?

## 2017-07-31 ENCOUNTER — Ambulatory Visit
Admission: RE | Admit: 2017-07-31 | Discharge: 2017-07-31 | Disposition: A | Discharge status: Home / Self Care | Payer: PPO | Source: Ambulatory Visit | Attending: Oncology | Admitting: Oncology

## 2017-07-31 DIAGNOSIS — C50912 Malignant neoplasm of unspecified site of left female breast: Secondary | ICD-10-CM

## 2017-07-31 DIAGNOSIS — D0512 Intraductal carcinoma in situ of left breast: Secondary | ICD-10-CM | POA: Diagnosis not present

## 2017-07-31 DIAGNOSIS — Z853 Personal history of malignant neoplasm of breast: Secondary | ICD-10-CM

## 2017-07-31 DIAGNOSIS — C773 Secondary and unspecified malignant neoplasm of axilla and upper limb lymph nodes: Secondary | ICD-10-CM | POA: Diagnosis not present

## 2017-07-31 HISTORY — DX: Personal history of antineoplastic chemotherapy: Z92.21

## 2017-07-31 HISTORY — DX: Personal history of irradiation: Z92.3

## 2017-08-01 DIAGNOSIS — G4733 Obstructive sleep apnea (adult) (pediatric): Secondary | ICD-10-CM | POA: Diagnosis not present

## 2017-08-12 ENCOUNTER — Telehealth: Payer: Self-pay | Admitting: Oncology

## 2017-08-12 ENCOUNTER — Inpatient Hospital Stay: Payer: PPO

## 2017-08-12 ENCOUNTER — Inpatient Hospital Stay (HOSPITAL_BASED_OUTPATIENT_CLINIC_OR_DEPARTMENT_OTHER): Payer: PPO | Admitting: Oncology

## 2017-08-12 VITALS — BP 116/54 | HR 61 | Temp 98.4°F | Resp 18 | Ht 61.0 in | Wt 146.3 lb

## 2017-08-12 DIAGNOSIS — D696 Thrombocytopenia, unspecified: Secondary | ICD-10-CM

## 2017-08-12 DIAGNOSIS — Z79899 Other long term (current) drug therapy: Secondary | ICD-10-CM | POA: Diagnosis not present

## 2017-08-12 DIAGNOSIS — Z7982 Long term (current) use of aspirin: Secondary | ICD-10-CM | POA: Diagnosis not present

## 2017-08-12 DIAGNOSIS — Z5189 Encounter for other specified aftercare: Secondary | ICD-10-CM | POA: Diagnosis not present

## 2017-08-12 DIAGNOSIS — C482 Malignant neoplasm of peritoneum, unspecified: Secondary | ICD-10-CM | POA: Diagnosis not present

## 2017-08-12 DIAGNOSIS — D701 Agranulocytosis secondary to cancer chemotherapy: Secondary | ICD-10-CM | POA: Diagnosis not present

## 2017-08-12 DIAGNOSIS — R530 Neoplastic (malignant) related fatigue: Secondary | ICD-10-CM | POA: Diagnosis not present

## 2017-08-12 DIAGNOSIS — Z95828 Presence of other vascular implants and grafts: Secondary | ICD-10-CM

## 2017-08-12 DIAGNOSIS — Z170 Estrogen receptor positive status [ER+]: Principal | ICD-10-CM

## 2017-08-12 DIAGNOSIS — D6481 Anemia due to antineoplastic chemotherapy: Secondary | ICD-10-CM

## 2017-08-12 DIAGNOSIS — Z923 Personal history of irradiation: Secondary | ICD-10-CM | POA: Diagnosis not present

## 2017-08-12 DIAGNOSIS — C50919 Malignant neoplasm of unspecified site of unspecified female breast: Secondary | ICD-10-CM

## 2017-08-12 DIAGNOSIS — C50412 Malignant neoplasm of upper-outer quadrant of left female breast: Secondary | ICD-10-CM

## 2017-08-12 LAB — CMP (CANCER CENTER ONLY)
ALT: 16 U/L (ref 0–55)
AST: 53 U/L — AB (ref 5–34)
Albumin: 3.5 g/dL (ref 3.5–5.0)
Alkaline Phosphatase: 93 U/L (ref 40–150)
Anion gap: 8 (ref 3–11)
BUN: 11 mg/dL (ref 7–26)
CHLORIDE: 108 mmol/L (ref 98–109)
CO2: 28 mmol/L (ref 22–29)
CREATININE: 0.66 mg/dL (ref 0.60–1.10)
Calcium: 9 mg/dL (ref 8.4–10.4)
GFR, Est AFR Am: >60 mL/min (ref >60)
GFR, Estimated: >60 mL/min (ref >60)
Glucose, Bld: 99 mg/dL (ref 70–140)
POTASSIUM: 3.9 mmol/L (ref 3.5–5.1)
SODIUM: 144 mmol/L (ref 136–145)
Total Bilirubin: 0.2 mg/dL (ref 0.2–1.2)
Total Protein: 7.1 g/dL (ref 6.4–8.3)

## 2017-08-12 LAB — CBC WITH DIFFERENTIAL (CANCER CENTER ONLY)
BASOS ABS: 0 10*3/uL (ref 0.0–0.1)
BASOS PCT: 0 %
EOS PCT: 1 %
Eosinophils Absolute: 0 10*3/uL (ref 0.0–0.5)
HCT: 27.7 % — ABNORMAL LOW (ref 34.8–46.6)
Hemoglobin: 8.4 g/dL — ABNORMAL LOW (ref 11.6–15.9)
LYMPHS PCT: 39 %
Lymphs Abs: 1.1 10*3/uL (ref 0.9–3.3)
MCH: 31.9 pg (ref 25.1–34.0)
MCHC: 30.3 g/dL — ABNORMAL LOW (ref 31.5–36.0)
MCV: 105.3 fL — ABNORMAL HIGH (ref 79.5–101.0)
MONO ABS: 0.3 10*3/uL (ref 0.1–0.9)
Monocytes Relative: 10 %
Neutro Abs: 1.4 10*3/uL — ABNORMAL LOW (ref 1.5–6.5)
Neutrophils Relative %: 50 %
PLATELETS: 44 10*3/uL — AB (ref 145–400)
RBC: 2.63 MIL/uL — AB (ref 3.70–5.45)
RDW: 16.4 % — AB (ref 11.2–14.5)
WBC: 2.8 10*3/uL — AB (ref 3.9–10.3)

## 2017-08-12 MED ORDER — SODIUM CHLORIDE 0.9 % IV SOLN
Freq: Once | INTRAVENOUS | Status: AC
  Administered 2017-08-12: 11:00:00 via INTRAVENOUS

## 2017-08-12 MED ORDER — DIPHENHYDRAMINE HCL 50 MG/ML IJ SOLN
50.0000 mg | Freq: Once | INTRAMUSCULAR | Status: AC
  Administered 2017-08-12: 50 mg via INTRAVENOUS

## 2017-08-12 MED ORDER — DEXAMETHASONE SODIUM PHOSPHATE 10 MG/ML IJ SOLN
4.0000 mg | Freq: Once | INTRAMUSCULAR | Status: AC
  Administered 2017-08-12: 4 mg via INTRAVENOUS

## 2017-08-12 MED ORDER — FAMOTIDINE IN NACL 20-0.9 MG/50ML-% IV SOLN: 20.0000 mg | Freq: Two times a day (BID) | INTRAVENOUS | Status: DC

## 2017-08-12 MED ORDER — DEXAMETHASONE SODIUM PHOSPHATE 10 MG/ML IJ SOLN
INTRAMUSCULAR | Status: AC
  Filled 2017-08-12: qty 1

## 2017-08-12 MED ORDER — FAMOTIDINE IN NACL 20-0.9 MG/50ML-% IV SOLN
INTRAVENOUS | Status: AC
Start: 2017-08-12 — End: ?
  Filled 2017-08-12: qty 50

## 2017-08-12 MED ORDER — PALONOSETRON HCL INJECTION 0.25 MG/5ML
INTRAVENOUS | Status: AC
  Filled 2017-08-12: qty 5

## 2017-08-12 MED ORDER — DIPHENHYDRAMINE HCL 50 MG/ML IJ SOLN
INTRAMUSCULAR | Status: AC
Start: 2017-08-12 — End: ?
  Filled 2017-08-12: qty 1

## 2017-08-12 MED ORDER — SODIUM CHLORIDE 0.9 % IJ SOLN
10.0000 mL | INTRAMUSCULAR | Status: DC | PRN
  Administered 2017-08-12: 10 mL via INTRAVENOUS
  Filled 2017-08-12: qty 10

## 2017-08-12 MED ORDER — HEPARIN SOD (PORK) LOCK FLUSH 100 UNIT/ML IV SOLN
500.0000 [IU] | Freq: Once | INTRAVENOUS | Status: AC | PRN
  Administered 2017-08-12: 500 [IU]
  Filled 2017-08-12: qty 5

## 2017-08-12 MED ORDER — SODIUM CHLORIDE 0.9% FLUSH
10.0000 mL | INTRAVENOUS | Status: DC | PRN
  Administered 2017-08-12: 10 mL
  Filled 2017-08-12: qty 10

## 2017-08-12 MED ORDER — PALONOSETRON HCL INJECTION 0.25 MG/5ML
0.2500 mg | Freq: Once | INTRAVENOUS | Status: AC
  Administered 2017-08-12: 0.25 mg via INTRAVENOUS

## 2017-08-12 MED ORDER — SODIUM CHLORIDE 0.9 % IV SOLN
570.0000 mg | Freq: Once | INTRAVENOUS | Status: AC
  Administered 2017-08-12: 570 mg via INTRAVENOUS
  Filled 2017-08-12: qty 57

## 2017-08-12 MED ORDER — FAMOTIDINE IN NACL 20-0.9 MG/50ML-% IV SOLN
20.0000 mg | Freq: Once | INTRAVENOUS | Status: AC
  Administered 2017-08-12: 20 mg via INTRAVENOUS

## 2017-08-12 NOTE — Progress Notes (Signed)
Hematology and Oncology Follow Up Visit  [name] [id number] 01/23/1956 62 y.o. 08/12/2017 10:21 AM    Principle Diagnosis: 62 year old woman with:  1.  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   2.  Left-sided breast cancer presenting with invasive ductal carcinoma and  positive sentinel lymph node biopsy diagnosed on 07/07/2015. The tumor is ER positive PR negative, HER-2 negative.   Prior Therapy:  Carboplatin and Taxotere started on 10/01/2012. Avastin was added with cycle 4. Chemotherapy only discontinued in June of 2015.  She is S/P Avastin maintenance only between June 2015 till March 2017. Therapy discontinued due to progression of disease.  She is status post Doxil salvage chemotherapy therapy discontinued because of hypersensitivity reaction in March 2017.  Current therapy:  Carboplatin and AUC of 5 started on 07/08/2015.  She continues to receive this treatment every 3 weeks with occasional break every 6 months or so.  She is here for the next cycle of therapy.  Interim History: [name] presents today for a follow-up.  Since the last visit, she has reported increased fatigue and few complaints of nausea and anorexia associated with her therapy.  She reports no vomiting or abdominal pain.  But have lost few pounds since the last visit.  Her mobility has not changed although she sustained few falls related to unsteadiness and lack of balance.  She has stopped Neurontin which have helped with her balance continues to have issues related to it.  He denies any active bleeding including hematochezia or melena.  She denies any easy bruising.   She does not report any headaches, blurry vision, syncope or seizures.  She denies any confusion or worsening neuropathy.  She denied any fever, chills or sweats.  She does not report any chest pain, palpitation, orthopnea or leg edema.  Has not reported any shortness of breath or cough. She does not  report any vomiting, hematochezia.   Denies any abdominal distention or early satiety.  She does not report any hematuria or dysuria. She does not report any bone pain or pathological fractures.  She does not report any lymphadenopathy or petechiae.  She denied any changes in her mood.  She denies any bleeding or clotting tendencies.  Remainder of the review of systems is negative.   Medications: I have reviewed the patient's current medications.  Current Outpatient Medications  Medication Sig Dispense Refill  . acetaminophen (TYLENOL) 325 MG tablet Take 2 tablets (650 mg total) by mouth every 4 (four) hours as needed for mild pain, fever or headache. 30 tablet 0  . Acetylcarnitine HCl (ACETYL L-CARNITINE PO) Take 2,000 mg by mouth at bedtime.    . ALPRAZolam (XANAX) 0.5 MG tablet Take 0.5 mg by mouth 2 (two) times daily as needed for anxiety.    Marland Kitchen aspirin 81 MG chewable tablet Chew 1 tablet (81 mg total) by mouth daily. 30 tablet 0  . atorvastatin (LIPITOR) 40 MG tablet Take 1 tablet (40 mg total) by mouth daily. 90 tablet 2  . carvedilol (COREG) 3.125 MG tablet Take 1/2 tablet by mouth twice daily with meals. 30 tablet 9  . clotrimazole-betamethasone (LOTRISONE) cream Apply 1 application topically 2 (two) times daily. 30 g 1  . divalproex (DEPAKOTE) 250 MG DR tablet Take by mouth. Take '500mg'$ s in the morning and '750mg'$ s at night    . fluticasone (FLONASE) 50 MCG/ACT nasal spray INHALE 2 SPRAYS INTO EACH NOSTRIL EVERY NIGHT  11  . gabapentin (NEURONTIN) 100 MG capsule  Take 1 capsule by mouth twice daily. (Patient not taking: Reported on 07/23/2017) 180 capsule 2  . lamoTRIgine (LAMICTAL) 100 MG tablet Take 100 mg by mouth 2 (two) times daily.    Marland Kitchen levothyroxine (SYNTHROID, LEVOTHROID) 100 MCG tablet Take 1 tablet (100 mcg total) by mouth daily. 90 tablet 2  . lidocaine-prilocaine (EMLA) cream Apply topically as needed. Apply to port with every chemotherapy. 30 g 2  . loperamide (IMODIUM A-D) 2 MG  tablet Take 2 mg by mouth as needed for diarrhea or loose stools.    Marland Kitchen loratadine (CLARITIN) 10 MG tablet Take 10 mg by mouth daily as needed for allergies (with chemo).    . nitroGLYCERIN (NITROSTAT) 0.4 MG SL tablet Place 1 tablet (0.4 mg total) under the tongue every 5 (five) minutes as needed for chest pain. 25 tablet 3  . ondansetron (ZOFRAN ODT) 8 MG disintegrating tablet Take 1 tablet (8 mg total) by mouth every 8 (eight) hours as needed for nausea or vomiting. 20 tablet 1  . ondansetron (ZOFRAN) 8 MG tablet TAKE 1 TABLET BY MOUTH EVERY 8 HOURS AS NEEDED FOR NAUSEA AND VOMITING 30 tablet 2  . pantoprazole (PROTONIX) 40 MG tablet Take 1 tablet (40 mg total) by mouth daily. 90 tablet 1  . prochlorperazine (COMPAZINE) 10 MG tablet Take 1 tablet (10 mg total) by mouth every 6 (six) hours as needed for nausea or vomiting. 30 tablet 0  . rOPINIRole (REQUIP) 0.5 MG tablet Take 3 tablets by mouth at bedtime. 90 tablet 4  . sertraline (ZOLOFT) 50 MG tablet      No current facility-administered medications for this visit.    Facility-Administered Medications Ordered in Other Visits  Medication Dose Route Frequency Provider Last Rate Last Dose  . heparin lock flush 100 unit/mL  500 Units Intravenous Once Wyatt Portela, MD      . sodium chloride flush (NS) 0.9 % injection 10 mL  10 mL Intravenous PRN Wyatt Portela, MD         Allergies:  Allergies  Allergen Reactions  . Doxil [Doxorubicin Hcl Liposomal] Anaphylaxis    1st Doxil.   . Pollen Extract Other (See Comments)    Pollen and grass causes a lot sneezing    Past Medical History, Surgical history, Social history, and Family History reviewed and unchanged.    Physical Exam: Blood pressure (!) 116/54, pulse 61, temperature 98.4 F (36.9 C), temperature source Oral, resp. rate 18, height '5\' 1"'$  (1.549 m), weight 146 lb 4.8 oz (66.4 kg), SpO2 100 %.    ECOG: 1 General appearance: Alert, awake woman without distress. Head:  Normocephalic without abnormalities. Oral mucosa: No oral thrush or ulcers. Eyes: Pupils are equal and round reactive to light. Lymph nodes: No lymphadenopathy noted in the cervical, axillary or supraclavicular nodes Heart: Regular Rate without any murmurs or gallops.  S1 and S2 auscultated without edema. Lung: Clear to auscultation in all lung fields.  No dullness to percussion. Abdomin: Soft, without any rebound or guarding.  No shifting dullness or ascites. Musculoskeletal: No joint deformity or effusion.  Full range of motion noted. Skin: No rashes or lesions.  Skin appeared slightly dry. Neurological: Motor and sensory exam intact.  Normal gait. Psychiatric: Mood and affect appeared appropriate.  Lab Results: Lab Results  Component Value Date   WBC 2.8 (L) 08/12/2017   HGB 8.4 (L) 08/12/2017   HCT 27.7 (L) 08/12/2017   MCV 105.3 (H) 08/12/2017   PLT 44 (L) 08/12/2017  Chemistry      Component Value Date/Time   NA 142 07/23/2017 0930   NA 142 01/21/2017 0822   K 4.1 07/23/2017 0930   K 4.1 01/21/2017 0822   CL 107 07/23/2017 0930   CO2 27 07/23/2017 0930   CO2 27 01/21/2017 0822   BUN 10 07/23/2017 0930   BUN 12.2 01/21/2017 0822   CREATININE 0.71 07/23/2017 0930   CREATININE 0.7 01/21/2017 0822      Component Value Date/Time   CALCIUM 8.8 07/23/2017 0930   CALCIUM 8.9 01/21/2017 0822   ALKPHOS 89 07/23/2017 0930   ALKPHOS 75 01/21/2017 0822   AST 16 07/23/2017 0930   AST 12 01/21/2017 0822   ALT <6 07/23/2017 0930   ALT 7 01/21/2017 0822   BILITOT 0.3 07/23/2017 0930   BILITOT 0.23 01/21/2017 0822       Results for RAYONA, SARDINHA (MRN [id number]) as of 08/12/2017 10:29  Ref. Range 06/13/2017 08:46 07/03/2017 11:09 07/23/2017 09:30  Cancer Antigen (CA) 125 Latest Ref Range: 0.0 - 38.1 U/mL 24.0 24.7 24.9      Impression and Plan:  62 year old woman with the following issues:  1.  Ovarian and malignancy presenting with peritoneal carcinomatosis  diagnosed in 2014.  She is status post multiple therapy outlined above and currently remains on carboplatin.  Her disease have been adequately palliated with the current regimen although she is experiencing some increased fatigue and nausea.  Her counts are slightly lower as well.  Risks and benefits of continuing chemotherapy was reviewed today and she is agreeable to proceed with therapy.  She will receive chemotherapy today and will receive a break for 6 weeks.  I anticipate resumption of chemotherapy on August 4 of 2019.  2. IV access: Port-A-Cath has been used for chemotherapy without issues.  3. Thrombocytopenia: This is related to baseline cirrhosis with carboplatin contributing.  No active bleeding noted at this time.  4. Left breast cancer: Mammogram obtained on 07/31/2017 was personally reviewed and her breast malignancy appears to be stable.  No definitive surgery is indicated at this time given her ovarian primary malignancy.   5. Neutropenia: Related to carboplatin chemotherapy.  She receives growth factor support after each cycle.  6.  Dizziness, falls and unsteadiness: MRI of the brain obtained on 07/13/2017 showed no evidence of malignancy.  She does have evidence of chronic sinusitis I urged her to obtain an ENT follow-up.  7.  Anemia: Her hemoglobin have drifted down slightly.  She is mildly symptomatic at this time.  The plan is to hold off on any transfusion for the time being and chemotherapy break will help boost her red cell production.  8. Followup: She will follow-up in 6 weeks for her next cycle of chemotherapy.  25  minutes was spent with the patient face-to-face today.  More than 50% of time was dedicated to patient counseling, education and coordinating her multifaceted care including future therapies for her malignancy and other complaints.  Zola Button, MD 6/24/201910:21 AM

## 2017-08-12 NOTE — Patient Instructions (Signed)
[facility] Discharge Instructions for Patients Receiving Chemotherapy  Today you received the following chemotherapy agents Carboplatin  To help prevent nausea and vomiting after your treatment, we encourage you to take your nausea medication as directed   If you develop nausea and vomiting that is not controlled by your nausea medication, call the clinic.   BELOW ARE SYMPTOMS THAT SHOULD BE REPORTED IMMEDIATELY:  *FEVER GREATER THAN 100.5 F  *CHILLS WITH OR WITHOUT FEVER  NAUSEA AND VOMITING THAT IS NOT CONTROLLED WITH YOUR NAUSEA MEDICATION  *UNUSUAL SHORTNESS OF BREATH  *UNUSUAL BRUISING OR BLEEDING  TENDERNESS IN MOUTH AND THROAT WITH OR WITHOUT PRESENCE OF ULCERS  *URINARY PROBLEMS  *BOWEL PROBLEMS  UNUSUAL RASH Items with * indicate a potential emergency and should be followed up as soon as possible.  Feel free to call the clinic should you have any questions or concerns. The clinic phone number is [phone].  Please show the CHEMO ALERT CARD at check-in to the Emergency Department and triage nurse.   

## 2017-08-12 NOTE — Telephone Encounter (Signed)
Appointments scheduled patient declined AVS/Calendar per 6/24 los

## 2017-08-12 NOTE — Progress Notes (Signed)
Per Dr. Alen Blew okay to proceed with Carboplatin infusion with AST, platelets, and ANC results from today.

## 2017-08-13 ENCOUNTER — Inpatient Hospital Stay: Payer: PPO

## 2017-08-13 VITALS — BP 120/67 | HR 87 | Temp 98.2°F | Resp 18

## 2017-08-13 DIAGNOSIS — C482 Malignant neoplasm of peritoneum, unspecified: Secondary | ICD-10-CM

## 2017-08-13 DIAGNOSIS — Z5189 Encounter for other specified aftercare: Secondary | ICD-10-CM | POA: Diagnosis not present

## 2017-08-13 LAB — CA 125: Cancer Antigen (CA) 125: 24.6 U/mL (ref 0.0–38.1)

## 2017-08-13 MED ORDER — PEGFILGRASTIM INJECTION 6 MG/0.6ML ~~LOC~~
PREFILLED_SYRINGE | SUBCUTANEOUS | Status: AC
Start: 2017-08-13 — End: ?
  Filled 2017-08-13: qty 0.6

## 2017-08-13 MED ORDER — PEGFILGRASTIM INJECTION 6 MG/0.6ML ~~LOC~~
6.0000 mg | PREFILLED_SYRINGE | Freq: Once | SUBCUTANEOUS | Status: AC
  Administered 2017-08-13: 6 mg via SUBCUTANEOUS

## 2017-08-13 NOTE — Patient Instructions (Signed)
Pegfilgrastim injection What is this medicine? PEGFILGRASTIM (PEG fil gra stim) is a long-acting granulocyte colony-stimulating factor that stimulates the growth of neutrophils, a type of white blood cell important in the body's fight against infection. It is used to reduce the incidence of fever and infection in patients with certain types of cancer who are receiving chemotherapy that affects the bone marrow, and to increase survival after being exposed to high doses of radiation. This medicine may be used for other purposes; ask your health care provider or pharmacist if you have questions. What should I tell my health care provider before I take this medicine? They need to know if you have any of these conditions: -kidney disease -latex allergy -ongoing radiation therapy -sickle cell disease -skin reactions to acrylic adhesives (On-Body Injector only) -an unusual or allergic reaction to pegfilgrastim, filgrastim, other medicines, foods, dyes, or preservatives -pregnant or trying to get pregnant -breast-feeding How should I use this medicine? This medicine is for injection under the skin. If you get this medicine at home, you will be taught how to prepare and give the pre-filled syringe or how to use the On-body Injector. Refer to the patient Instructions for Use for detailed instructions. Use exactly as directed. Take your medicine at regular intervals. Do not take your medicine more often than directed. It is important that you put your used needles and syringes in a special sharps container. Do not put them in a trash can. If you do not have a sharps container, call your pharmacist or healthcare provider to get one. Talk to your pediatrician regarding the use of this medicine in children. While this drug may be prescribed for selected conditions, precautions do apply. Overdosage: If you think you have taken too much of this medicine contact a poison control center or emergency room at  once. NOTE: This medicine is only for you. Do not share this medicine with others. What if I miss a dose? It is important not to miss your dose. Call your doctor or health care professional if you miss your dose. If you miss a dose due to an On-body Injector failure or leakage, a new dose should be administered as soon as possible using a single prefilled syringe for manual use. What may interact with this medicine? Interactions have not been studied. Give your health care provider a list of all the medicines, herbs, non-prescription drugs, or dietary supplements you use. Also tell them if you smoke, drink alcohol, or use illegal drugs. Some items may interact with your medicine. This list may not describe all possible interactions. Give your health care provider a list of all the medicines, herbs, non-prescription drugs, or dietary supplements you use. Also tell them if you smoke, drink alcohol, or use illegal drugs. Some items may interact with your medicine. What should I watch for while using this medicine? You may need blood work done while you are taking this medicine. If you are going to need a MRI, CT scan, or other procedure, tell your doctor that you are using this medicine (On-Body Injector only). What side effects may I notice from receiving this medicine? Side effects that you should report to your doctor or health care professional as soon as possible: -allergic reactions like skin rash, itching or hives, swelling of the face, lips, or tongue -dizziness -fever -pain, redness, or irritation at site where injected -pinpoint red spots on the skin -red or dark-brown urine -shortness of breath or breathing problems -stomach or side pain, or pain   at the shoulder -swelling -tiredness -trouble passing urine or change in the amount of urine Side effects that usually do not require medical attention (report to your doctor or health care professional if they continue or are  bothersome): -bone pain -muscle pain This list may not describe all possible side effects. Call your doctor for medical advice about side effects. You may report side effects to FDA at 1-800-FDA-1088. Where should I keep my medicine? Keep out of the reach of children. Store pre-filled syringes in a refrigerator between 2 and 8 degrees C (36 and 46 degrees F). Do not freeze. Keep in carton to protect from light. Throw away this medicine if it is left out of the refrigerator for more than 48 hours. Throw away any unused medicine after the expiration date. NOTE: This sheet is a summary. It may not cover all possible information. If you have questions about this medicine, talk to your doctor, pharmacist, or health care provider.    2016, Elsevier/Gold Standard. (2014-02-25 14:30:14)  

## 2017-08-29 ENCOUNTER — Telehealth: Payer: Self-pay | Admitting: Family Medicine

## 2017-08-29 ENCOUNTER — Telehealth: Payer: Self-pay

## 2017-08-29 DIAGNOSIS — E039 Hypothyroidism, unspecified: Secondary | ICD-10-CM

## 2017-08-29 MED ORDER — LEVOTHYROXINE SODIUM 100 MCG PO TABS: 100.0000 ug | ORAL_TABLET | Freq: Every day | ORAL | 1 refills | Status: DC

## 2017-08-29 NOTE — Telephone Encounter (Signed)
[place] who states that the prescription was transferred to a local pharmacy on 07/29/17 so a new prescription is needed for the pt. Prescription originally sent on 04/02/17 has been discontinued.

## 2017-08-29 NOTE — Telephone Encounter (Signed)
Pt called and left VM requesting recommendation from Dr. Alen Blew regarding sinus medication. Per Dr. Alen Blew there isn't anything that would be a contraindication to take with the patient's other medications to treat sinus symptoms, but this is something the patient should discuss with her PCP. Left VM on pt phone detailing this information.

## 2017-08-29 NOTE — Telephone Encounter (Signed)
Copied from [place] [phone]. Topic: Quick Communication - Rx Refill/Question >> Aug 29, 2017  3:44 PM Carolyn Stare wrote: Medication     levothyroxine (SYNTHROID, LEVOTHROID) 100 MCG tablet     [facility] is asking for the RX so that they can send the rx out ot pt on 09/12/17  Has the patient contacted their pharmacy yes    Preferred Pharmacy  [facility]: Please be advised that RX refills may take up to 3 business days. We ask that you follow-up with your pharmacy.

## 2017-08-30 ENCOUNTER — Ambulatory Visit: Payer: Self-pay

## 2017-08-30 ENCOUNTER — Ambulatory Visit: Payer: Self-pay | Admitting: *Deleted

## 2017-08-30 ENCOUNTER — Encounter: Payer: Self-pay | Admitting: Family Medicine

## 2017-08-30 ENCOUNTER — Ambulatory Visit (INDEPENDENT_AMBULATORY_CARE_PROVIDER_SITE_OTHER): Payer: PPO | Admitting: Family Medicine

## 2017-08-30 VITALS — BP 118/66 | HR 72 | Temp 98.6°F | Ht 61.0 in | Wt 145.0 lb

## 2017-08-30 DIAGNOSIS — J329 Chronic sinusitis, unspecified: Secondary | ICD-10-CM

## 2017-08-30 MED ORDER — IPRATROPIUM BROMIDE 0.06 % NA SOLN: 2.0000 | Freq: Four times a day (QID) | NASAL | 0 refills | Status: DC

## 2017-08-30 MED ORDER — AMOXICILLIN-POT CLAVULANATE 875-125 MG PO TABS
1.0000 | ORAL_TABLET | Freq: Two times a day (BID) | ORAL | 0 refills | Status: DC
Start: 2017-08-30 — End: 2017-09-23

## 2017-08-30 NOTE — Telephone Encounter (Signed)
Patient called requesting advice on a possible over the counter medication for a possible sinus infection. Please call patient.    Thank You!!!  Patient is calling with sinus and ear pressure that is not getting better.  Reason for Disposition . Earache  Answer Assessment - Initial Assessment Questions 1. LOCATION: "Where does it hurt?"      Sinus pressure in nasal area and across face 2. ONSET: "When did the sinus pain start?"  (e.g., hours, days)      Couple days ago 3. SEVERITY: "How bad is the pain?"   (Scale 1-10; mild, moderate or severe)   - MILD (1-3): doesn't interfere with normal activities    - MODERATE (4-7): interferes with normal activities (e.g., work or school) or awakens from sleep   - SEVERE (8-10): excruciating pain and patient unable to do any normal activities        No pain- eyes are hurting 4. RECURRENT SYMPTOM: "Have you ever had sinus problems before?" If so, ask: "When was the last time?" and "What happened that time?"      Not normal for patient- patient does have alleries 5. NASAL CONGESTION: "Is the nose blocked?" If so, ask, "Can you open it or must you breathe through the mouth?"     No nasal congestion- just heaviness. Ears feel stopped up 6. NASAL DISCHARGE: "Do you have discharge from your nose?" If so ask, "What color?"     No discharge from nose 7. FEVER: "Do you have a fever?" If so, ask: "What is it, how was it measured, and when did it start?"      No fvere 8. OTHER SYMPTOMS: "Do you have any other symptoms?" (e.g., sore throat, cough, earache, difficulty breathing)     Slight sore throat, scratch voice, pressure in L ear 9. PREGNANCY: "Is there any chance you are pregnant?" "When was your last menstrual period?"     n/a  Protocols used: SINUS PAIN OR CONGESTION-A-AH

## 2017-08-30 NOTE — Progress Notes (Signed)
   Subjective:  [name] is a 62 y.o. female who presents today for same-day appointment with a chief complaint of sinus congestion.   HPI:  Sinus Congestion, Acute problem Symptoms started yesterday.  Stable over that time.  Associated with eye irritation, left ear pressure, and headache.  No rhinorrhea, cough, or sore throat.  Feels very stuffy.  No fevers or chills.  No sick contacts.  No treatments tried.  No other obvious alleviating or aggravating factors.  ROS: Per HPI  PMH: She reports that she has never smoked. She has never used smokeless tobacco. She reports that she drinks alcohol. She reports that she does not use drugs.  Objective:  Physical Exam: BP 118/66 (BP Location: Right Arm, Patient Position: Sitting, Cuff Size: Normal)   Pulse 72   Temp 98.6 F (37 C) (Oral)   Ht 5\' 1"  (1.549 m)   Wt 145 lb (65.8 kg)   SpO2 97%   BMI 27.40 kg/m   Gen: NAD, resting comfortably HEENT: TMs with clear effusion bilaterally.  Oropharynx erythematous without exudate.  Nasal mucosa erythematous and boggy bilaterally. CV: RRR with no murmurs appreciated Pulm: NWOB, CTAB with no crackles, wheezes, or rhonchi   Assessment/Plan:  Sinusitis Patient with evidence of sinusitis on brain MRI from about 6 weeks ago.  Given her current symptoms and findings on her MRI from several weeks ago, we will start a course of Augmentin today.  Also start Atrovent nasal spray.  Recommended good oral hydration.  Also recommended Tylenol and/or Motrin as needed.  Discussed reasons to return to care.  Follow-up as needed.  Algis Greenhouse. Jerline Pain, MD 08/30/2017 4:25 PM

## 2017-08-30 NOTE — Telephone Encounter (Signed)
Patient seen at [facility] for Sinusitis. No further assistance needed.

## 2017-08-30 NOTE — Telephone Encounter (Signed)
Per Dr. [name], patient can be seen at our office if she wanted to, patient could be added as her last patient of the day, it is really up to the patient.

## 2017-08-30 NOTE — Patient Instructions (Signed)
Start the atrovent and augmentin.  Please stay well hydrated.  You can take tylenol and/or motrin as needed for low grade fever and pain.  Please let me know if your symptoms worsen or fail to improve.  Take care, Dr Parker  

## 2017-08-30 NOTE — Telephone Encounter (Signed)
PEC scheduled patient at [facility] as there are no available appts here in our office.   Will send to Dr [name] as Juluis Rainier

## 2017-08-31 DIAGNOSIS — G4733 Obstructive sleep apnea (adult) (pediatric): Secondary | ICD-10-CM | POA: Diagnosis not present

## 2017-09-02 ENCOUNTER — Telehealth: Payer: Self-pay | Admitting: *Deleted

## 2017-09-02 NOTE — Telephone Encounter (Signed)
Ok with me 

## 2017-09-02 NOTE — Telephone Encounter (Signed)
Copied from [place] [phone]. Topic: Quick Communication - See Telephone Encounter >> Sep 02, 2017  1:38 PM Hallel, [place] wrote: Pt is requesting that she is wanting to switch from Dr. [name] to Dr. Volanda Napoleon is this possible   Please call pt at [phone]

## 2017-09-02 NOTE — Telephone Encounter (Signed)
Dr [name] please advise if you are okay with patient transferring care to Dr Volanda Napoleon?

## 2017-09-03 NOTE — Telephone Encounter (Signed)
Dr Volanda Napoleon please advise if you are okay with TOC. Pt is requesting to establish care with you. Thanks.

## 2017-09-11 NOTE — Telephone Encounter (Signed)
Ok

## 2017-09-13 ENCOUNTER — Other Ambulatory Visit: Payer: Self-pay | Admitting: Internal Medicine

## 2017-09-13 NOTE — Telephone Encounter (Signed)
Called pt left a detailed message to call the office and schedule an appointment to establish care with dr Volanda Napoleon

## 2017-09-16 ENCOUNTER — Telehealth: Payer: Self-pay | Admitting: Oncology

## 2017-09-16 NOTE — Telephone Encounter (Signed)
Tried to reach patient regarding voicemail. She wanted to change her injection time

## 2017-09-20 ENCOUNTER — Other Ambulatory Visit (INDEPENDENT_AMBULATORY_CARE_PROVIDER_SITE_OTHER): Payer: PPO

## 2017-09-20 DIAGNOSIS — E039 Hypothyroidism, unspecified: Secondary | ICD-10-CM | POA: Diagnosis not present

## 2017-09-20 LAB — TSH: TSH: 2.72 u[IU]/mL (ref 0.35–4.50)

## 2017-09-23 ENCOUNTER — Inpatient Hospital Stay: Payer: PPO | Attending: Oncology

## 2017-09-23 ENCOUNTER — Inpatient Hospital Stay (HOSPITAL_BASED_OUTPATIENT_CLINIC_OR_DEPARTMENT_OTHER): Payer: PPO | Admitting: Oncology

## 2017-09-23 ENCOUNTER — Inpatient Hospital Stay: Payer: PPO

## 2017-09-23 ENCOUNTER — Encounter: Payer: Self-pay | Admitting: Family Medicine

## 2017-09-23 ENCOUNTER — Telehealth: Payer: Self-pay | Admitting: Oncology

## 2017-09-23 ENCOUNTER — Encounter: Payer: Self-pay | Admitting: Oncology

## 2017-09-23 VITALS — BP 113/62 | HR 55 | Temp 97.8°F | Resp 17 | Ht 61.0 in | Wt 145.1 lb

## 2017-09-23 DIAGNOSIS — C50919 Malignant neoplasm of unspecified site of unspecified female breast: Secondary | ICD-10-CM

## 2017-09-23 DIAGNOSIS — R42 Dizziness and giddiness: Secondary | ICD-10-CM | POA: Insufficient documentation

## 2017-09-23 DIAGNOSIS — Z170 Estrogen receptor positive status [ER+]: Secondary | ICD-10-CM

## 2017-09-23 DIAGNOSIS — D6959 Other secondary thrombocytopenia: Secondary | ICD-10-CM

## 2017-09-23 DIAGNOSIS — D649 Anemia, unspecified: Secondary | ICD-10-CM | POA: Diagnosis not present

## 2017-09-23 DIAGNOSIS — Z853 Personal history of malignant neoplasm of breast: Secondary | ICD-10-CM | POA: Diagnosis not present

## 2017-09-23 DIAGNOSIS — Z5111 Encounter for antineoplastic chemotherapy: Secondary | ICD-10-CM | POA: Diagnosis not present

## 2017-09-23 DIAGNOSIS — D701 Agranulocytosis secondary to cancer chemotherapy: Secondary | ICD-10-CM

## 2017-09-23 DIAGNOSIS — C482 Malignant neoplasm of peritoneum, unspecified: Secondary | ICD-10-CM

## 2017-09-23 DIAGNOSIS — C786 Secondary malignant neoplasm of retroperitoneum and peritoneum: Secondary | ICD-10-CM | POA: Insufficient documentation

## 2017-09-23 DIAGNOSIS — Z95828 Presence of other vascular implants and grafts: Secondary | ICD-10-CM

## 2017-09-23 DIAGNOSIS — C569 Malignant neoplasm of unspecified ovary: Secondary | ICD-10-CM | POA: Diagnosis not present

## 2017-09-23 DIAGNOSIS — C50412 Malignant neoplasm of upper-outer quadrant of left female breast: Secondary | ICD-10-CM

## 2017-09-23 LAB — CBC WITH DIFFERENTIAL (CANCER CENTER ONLY)
BASOS PCT: 3 %
Basophils Absolute: 0.1 10*3/uL (ref 0.0–0.1)
EOS ABS: 0 10*3/uL (ref 0.0–0.5)
Eosinophils Relative: 1 %
HEMATOCRIT: 27.8 % — AB (ref 34.8–46.6)
Hemoglobin: 8.9 g/dL — ABNORMAL LOW (ref 11.6–15.9)
Lymphocytes Relative: 40 %
Lymphs Abs: 1.2 10*3/uL (ref 0.9–3.3)
MCH: 31.3 pg (ref 25.1–34.0)
MCHC: 32.2 g/dL (ref 31.5–36.0)
MCV: 97.4 fL (ref 79.5–101.0)
MONOS PCT: 14 %
Monocytes Absolute: 0.4 10*3/uL (ref 0.1–0.9)
NEUTROS ABS: 1.3 10*3/uL — AB (ref 1.5–6.5)
Neutrophils Relative %: 42 %
PLATELETS: 82 10*3/uL — AB (ref 145–400)
RBC: 2.85 MIL/uL — ABNORMAL LOW (ref 3.70–5.45)
RDW: 17.7 % — AB (ref 11.2–14.5)
WBC Count: 3 10*3/uL — ABNORMAL LOW (ref 3.9–10.3)

## 2017-09-23 LAB — CMP (CANCER CENTER ONLY)
ALBUMIN: 3 g/dL — AB (ref 3.5–5.0)
ALK PHOS: 82 U/L (ref 38–126)
ALT: 8 U/L (ref 0–44)
AST: 17 U/L (ref 15–41)
Anion gap: 10 (ref 5–15)
BILIRUBIN TOTAL: 0.3 mg/dL (ref 0.3–1.2)
BUN: 11 mg/dL (ref 6–20)
CALCIUM: 8.9 mg/dL (ref 8.9–10.3)
CO2: 26 mmol/L (ref 22–32)
Chloride: 106 mmol/L (ref 98–111)
Creatinine: 0.71 mg/dL (ref 0.44–1.00)
GFR, Est AFR Am: >60 mL/min (ref >60)
GFR, Estimated: >60 mL/min (ref >60)
GLUCOSE: 100 mg/dL — AB (ref 70–99)
Potassium: 4.1 mmol/L (ref 3.5–5.1)
Sodium: 142 mmol/L (ref 135–145)
TOTAL PROTEIN: 7.7 g/dL (ref 6.5–8.1)

## 2017-09-23 MED ORDER — FAMOTIDINE IN NACL 20-0.9 MG/50ML-% IV SOLN
INTRAVENOUS | Status: AC
  Filled 2017-09-23: qty 50

## 2017-09-23 MED ORDER — DIPHENHYDRAMINE HCL 50 MG/ML IJ SOLN
50.0000 mg | Freq: Once | INTRAMUSCULAR | Status: AC
  Administered 2017-09-23: 50 mg via INTRAVENOUS

## 2017-09-23 MED ORDER — SODIUM CHLORIDE 0.9% FLUSH
10.0000 mL | INTRAVENOUS | Status: DC | PRN
  Administered 2017-09-23: 10 mL
  Filled 2017-09-23: qty 10

## 2017-09-23 MED ORDER — PALONOSETRON HCL INJECTION 0.25 MG/5ML
INTRAVENOUS | Status: AC
  Filled 2017-09-23: qty 5

## 2017-09-23 MED ORDER — FAMOTIDINE IN NACL 20-0.9 MG/50ML-% IV SOLN
20.0000 mg | Freq: Once | INTRAVENOUS | Status: AC
  Administered 2017-09-23: 20 mg via INTRAVENOUS

## 2017-09-23 MED ORDER — SODIUM CHLORIDE 0.9 % IJ SOLN
10.0000 mL | INTRAMUSCULAR | Status: DC | PRN
  Administered 2017-09-23: 10 mL via INTRAVENOUS
  Filled 2017-09-23: qty 10

## 2017-09-23 MED ORDER — PALONOSETRON HCL INJECTION 0.25 MG/5ML
0.2500 mg | Freq: Once | INTRAVENOUS | Status: AC
  Administered 2017-09-23: 0.25 mg via INTRAVENOUS

## 2017-09-23 MED ORDER — DIPHENHYDRAMINE HCL 50 MG/ML IJ SOLN
INTRAMUSCULAR | Status: AC
  Filled 2017-09-23: qty 1

## 2017-09-23 MED ORDER — SODIUM CHLORIDE 0.9 % IV SOLN
Freq: Once | INTRAVENOUS | Status: AC
  Administered 2017-09-23: 10:00:00 via INTRAVENOUS
  Filled 2017-09-23: qty 250

## 2017-09-23 MED ORDER — DEXAMETHASONE SODIUM PHOSPHATE 10 MG/ML IJ SOLN
INTRAMUSCULAR | Status: AC
  Filled 2017-09-23: qty 1

## 2017-09-23 MED ORDER — HEPARIN SOD (PORK) LOCK FLUSH 100 UNIT/ML IV SOLN
500.0000 [IU] | Freq: Once | INTRAVENOUS | Status: AC | PRN
  Administered 2017-09-23: 500 [IU]
  Filled 2017-09-23: qty 5

## 2017-09-23 MED ORDER — SODIUM CHLORIDE 0.9 % IV SOLN
570.0000 mg | Freq: Once | INTRAVENOUS | Status: AC
  Administered 2017-09-23: 570 mg via INTRAVENOUS
  Filled 2017-09-23: qty 57

## 2017-09-23 MED ORDER — DEXAMETHASONE SODIUM PHOSPHATE 10 MG/ML IJ SOLN
4.0000 mg | Freq: Once | INTRAMUSCULAR | Status: AC
  Administered 2017-09-23: 4 mg via INTRAVENOUS

## 2017-09-23 NOTE — Progress Notes (Signed)
Dr. Alen Blew ok to tx with ANC 1.3 and Plt 82.

## 2017-09-23 NOTE — Progress Notes (Signed)
Per Dr Alen Blew ok to treat today with Carboplatin with ANC 1.3 and Platelets 82.  Infusion nurse notified

## 2017-09-23 NOTE — Patient Instructions (Signed)
[facility] Discharge Instructions for Patients Receiving Chemotherapy  Today you received the following chemotherapy agents Carboplatin  To help prevent nausea and vomiting after your treatment, we encourage you to take your nausea medication as directed   If you develop nausea and vomiting that is not controlled by your nausea medication, call the clinic.   BELOW ARE SYMPTOMS THAT SHOULD BE REPORTED IMMEDIATELY:  *FEVER GREATER THAN 100.5 F  *CHILLS WITH OR WITHOUT FEVER  NAUSEA AND VOMITING THAT IS NOT CONTROLLED WITH YOUR NAUSEA MEDICATION  *UNUSUAL SHORTNESS OF BREATH  *UNUSUAL BRUISING OR BLEEDING  TENDERNESS IN MOUTH AND THROAT WITH OR WITHOUT PRESENCE OF ULCERS  *URINARY PROBLEMS  *BOWEL PROBLEMS  UNUSUAL RASH Items with * indicate a potential emergency and should be followed up as soon as possible.  Feel free to call the clinic should you have any questions or concerns. The clinic phone number is [phone].  Please show the CHEMO ALERT CARD at check-in to the Emergency Department and triage nurse.   

## 2017-09-23 NOTE — Patient Instructions (Signed)
Implanted Port Home Guide An implanted port is a type of central line that is placed under the skin. Central lines are used to provide IV access when treatment or nutrition needs to be given through a person's veins. Implanted ports are used for long-term IV access. An implanted port may be placed because:  You need IV medicine that would be irritating to the small veins in your hands or arms.  You need long-term IV medicines, such as antibiotics.  You need IV nutrition for a long period.  You need frequent blood draws for lab tests.  You need dialysis.  Implanted ports are usually placed in the chest area, but they can also be placed in the upper arm, the abdomen, or the leg. An implanted port has two main parts:  Reservoir. The reservoir is round and will appear as a small, raised area under your skin. The reservoir is the part where a needle is inserted to give medicines or draw blood.  Catheter. The catheter is a thin, flexible tube that extends from the reservoir. The catheter is placed into a large vein. Medicine that is inserted into the reservoir goes into the catheter and then into the vein.  How will I care for my incision site? Do not get the incision site wet. Bathe or shower as directed by your health care provider. How is my port accessed? Special steps must be taken to access the port:  Before the port is accessed, a numbing cream can be placed on the skin. This helps numb the skin over the port site.  Your health care provider uses a sterile technique to access the port. ? Your health care provider must put on a mask and sterile gloves. ? The skin over your port is cleaned carefully with an antiseptic and allowed to dry. ? The port is gently pinched between sterile gloves, and a needle is inserted into the port.  Only "non-coring" port needles should be used to access the port. Once the port is accessed, a blood return should be checked. This helps ensure that the port  is in the vein and is not clogged.  If your port needs to remain accessed for a constant infusion, a clear (transparent) bandage will be placed over the needle site. The bandage and needle will need to be changed every week, or as directed by your health care provider.  Keep the bandage covering the needle clean and dry. Do not get it wet. Follow your health care provider's instructions on how to take a shower or bath while the port is accessed.  If your port does not need to stay accessed, no bandage is needed over the port.  What is flushing? Flushing helps keep the port from getting clogged. Follow your health care provider's instructions on how and when to flush the port. Ports are usually flushed with saline solution or a medicine called heparin. The need for flushing will depend on how the port is used.  If the port is used for intermittent medicines or blood draws, the port will need to be flushed: ? After medicines have been given. ? After blood has been drawn. ? As part of routine maintenance.  If a constant infusion is running, the port may not need to be flushed.  How long will my port stay implanted? The port can stay in for as long as your health care provider thinks it is needed. When it is time for the port to come out, surgery will be   done to remove it. The procedure is similar to the one performed when the port was put in. When should I seek immediate medical care? When you have an implanted port, you should seek immediate medical care if:  You notice a bad smell coming from the incision site.  You have swelling, redness, or drainage at the incision site.  You have more swelling or pain at the port site or the surrounding area.  You have a fever that is not controlled with medicine.  This information is not intended to replace advice given to you by your health care provider. Make sure you discuss any questions you have with your health care provider. Document  Released: 02/05/2005 Document Revised: 07/14/2015 Document Reviewed: 10/13/2012 Elsevier Interactive Patient Education  2017 [employer]  

## 2017-09-23 NOTE — Telephone Encounter (Signed)
Appts scheduled AVS/Calendar printed per 8/5 los

## 2017-09-23 NOTE — Progress Notes (Signed)
Hematology and Oncology Follow Up Visit  [name] [id number] 03/08/1955 61 y.o. 09/23/2017 9:22 AM    Principle Diagnosis: 62-year-old woman with:  1.  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  This is arising from ovarian primary.  2.  Left-sided breast cancer diagnosed in May 2017.  She presented with invasive ductal carcinoma and positive sentinel lymph node biopsy. The tumor is ER positive PR negative, HER-2 negative.   Prior Therapy:  Carboplatin and Taxotere started on 10/01/2012. Avastin was added with cycle 4. Chemotherapy only discontinued in June of 2015.  She is S/P Avastin maintenance only between June 2015 till March 2017. Therapy discontinued due to progression of disease.  She is status post Doxil salvage chemotherapy therapy discontinued because of hypersensitivity reaction in March 2017.  Current therapy:  Carboplatin and AUC of 5 started on 07/08/2015.  Repeat given every 3 weeks with reasonable tolerance and response at this time.  Interim History: [name] is here for a follow-up.  Since the last visit, she had a treatment break which she enjoyed although some of her complaints did not change dramatically.  She does report some mild fatigue and nausea but no vomiting.  She does report hemorrhoidal bleed at times after using the bathroom with blood on tissue paper.  She does report lower pelvic discomfort times but still able to eat and maintain her weight.  She is ambulating with the help of a cane and has not had any falls or syncope.  She denies any recent infections or hospitalizations.  She is scheduled to have dental work in the near future.  She does not report any headaches, blurry vision, or seizures.  She denies any alteration of mental status or dizziness.  She denied any fever, chills or sweats.  She does not report any chest pain, palpitation, orthopnea or leg edema.  Has not  reported any shortness of breath or cough. She does not report any vomiting, hematochezia.   Denies any changes in her bowel habits or hematochezia.  She does not report any hematuria or dysuria. She does not report any bone pain or pathological fractures.  She does not report any lymphadenopathy or petechiae.  She denied any anxiety or depression.  She denies any easy bruising.  Remainder of the review of systems is negative.   Medications: I have reviewed the patient's current medications.  Current Outpatient Medications  Medication Sig Dispense Refill  . acetaminophen (TYLENOL) 325 MG tablet Take 2 tablets (650 mg total) by mouth every 4 (four) hours as needed for mild pain, fever or headache. 30 tablet 0  . Acetylcarnitine HCl (ACETYL L-CARNITINE PO) Take 2,000 mg by mouth at bedtime.    . ALPRAZolam (XANAX) 0.5 MG tablet Take 0.5 mg by mouth 2 (two) times daily as needed for anxiety.    . amoxicillin-clavulanate (AUGMENTIN) 875-125 MG tablet Take 1 tablet by mouth 2 (two) times daily. 20 tablet 0  . aspirin 81 MG chewable tablet Chew 1 tablet (81 mg total) by mouth daily. 30 tablet 0  . atorvastatin (LIPITOR) 40 MG tablet Take 1 tablet (40 mg total) by mouth daily. 90 tablet 2  . carvedilol (COREG) 3.125 MG tablet Take 1/2 tablet by mouth twice daily with meals. 30 tablet 3  . clotrimazole-betamethasone (LOTRISONE) cream Apply 1 application topically 2 (two) times daily. 30 g 1  . divalproex (DEPAKOTE) 250 MG DR tablet Take by mouth. Take 500mgs in   the morning and 750mgs at night    . fluticasone (FLONASE) 50 MCG/ACT nasal spray INHALE 2 SPRAYS INTO EACH NOSTRIL EVERY NIGHT  11  . ipratropium (ATROVENT) 0.06 % nasal spray Place 2 sprays into both nostrils 4 (four) times daily. 15 mL 0  . lamoTRIgine (LAMICTAL) 100 MG tablet Take 100 mg by mouth 2 (two) times daily.    . levothyroxine (SYNTHROID, LEVOTHROID) 100 MCG tablet Take 1 tablet (100 mcg total) by mouth daily. 90 tablet 1  .  lidocaine-prilocaine (EMLA) cream Apply topically as needed. Apply to port with every chemotherapy. 30 g 2  . loperamide (IMODIUM A-D) 2 MG tablet Take 2 mg by mouth as needed for diarrhea or loose stools.    . loratadine (CLARITIN) 10 MG tablet Take 10 mg by mouth daily as needed for allergies (with chemo).    . nitroGLYCERIN (NITROSTAT) 0.4 MG SL tablet Place 1 tablet (0.4 mg total) under the tongue every 5 (five) minutes as needed for chest pain. 25 tablet 3  . ondansetron (ZOFRAN ODT) 8 MG disintegrating tablet Take 1 tablet (8 mg total) by mouth every 8 (eight) hours as needed for nausea or vomiting. 20 tablet 1  . ondansetron (ZOFRAN) 8 MG tablet TAKE 1 TABLET BY MOUTH EVERY 8 HOURS AS NEEDED FOR NAUSEA AND VOMITING 30 tablet 2  . pantoprazole (PROTONIX) 40 MG tablet Take 1 tablet (40 mg total) by mouth daily. 90 tablet 1  . prochlorperazine (COMPAZINE) 10 MG tablet Take 1 tablet (10 mg total) by mouth every 6 (six) hours as needed for nausea or vomiting. 30 tablet 0  . rOPINIRole (REQUIP) 0.5 MG tablet Take 3 tablets by mouth at bedtime. 90 tablet 4  . sertraline (ZOLOFT) 50 MG tablet      No current facility-administered medications for this visit.    Facility-Administered Medications Ordered in Other Visits  Medication Dose Route Frequency Provider Last Rate Last Dose  . heparin lock flush 100 unit/mL  500 Units Intravenous Once ,  N, MD      . sodium chloride 0.9 % injection 10 mL  10 mL Intravenous PRN ,  N, MD      . sodium chloride flush (NS) 0.9 % injection 10 mL  10 mL Intravenous PRN ,  N, MD         Allergies:  Allergies  Allergen Reactions  . Doxil [Doxorubicin Hcl Liposomal] Anaphylaxis    1st Doxil.   . Pollen Extract Other (See Comments)    Pollen and grass causes a lot sneezing    Past Medical History, Surgical history, Social history, and Family History reviewed and unchanged.    Physical Exam:  Blood pressure 113/62, pulse  (!) 55, temperature 97.8 F (36.6 C), temperature source Oral, resp. rate 17, height 5' 1" (1.549 m), weight 145 lb 1.6 oz (65.8 kg), SpO2 99 %.    ECOG: 1 General appearance: Well-appearing woman without distress. Head: Atraumatic without normalities. Oropharynx: Without any thrush or ulcers. Eyes: Sclera anicteric. Lymph nodes: No cervical, axillary, inguinal or supraclavicular lymphadenopathy. Heart: regular rate and rhythm without any murmurs.  No leg edema. Lung: Clear without any rhonchi, wheezes or dullness to percussion.   Abdomin: Soft, nontender, nondistended with good bowel sounds. Musculoskeletal: No clubbing or cyanosis. Skin: No skin rashes or lesions.  No ecchymosis. Neurological: No deficits noted on motor or sensory exam. Psychiatric: No changes in her mood or affect.  Appeared appropriate.  Lab Results: Lab Results  Component Value   Date   WBC 2.8 (L) 08/12/2017   HGB 8.4 (L) 08/12/2017   HCT 27.7 (L) 08/12/2017   MCV 105.3 (H) 08/12/2017   PLT 44 (L) 08/12/2017     Chemistry      Component Value Date/Time   NA 144 08/12/2017 0917   NA 142 01/21/2017 0822   K 3.9 08/12/2017 0917   K 4.1 01/21/2017 0822   CL 108 08/12/2017 0917   CO2 28 08/12/2017 0917   CO2 27 01/21/2017 0822   BUN 11 08/12/2017 0917   BUN 12.2 01/21/2017 0822   CREATININE 0.66 08/12/2017 0917   CREATININE 0.7 01/21/2017 0822      Component Value Date/Time   CALCIUM 9.0 08/12/2017 0917   CALCIUM 8.9 01/21/2017 0822   ALKPHOS 93 08/12/2017 0917   ALKPHOS 75 01/21/2017 0822   AST 53 (H) 08/12/2017 0917   AST 12 01/21/2017 0822   ALT 16 08/12/2017 0917   ALT 7 01/21/2017 0822   BILITOT 0.2 08/12/2017 0917   BILITOT 0.23 01/21/2017 0822         Results for Seifer, Azani M (MRN [id number]) as of 09/23/2017 09:22  Ref. Range 07/23/2017 09:30 08/12/2017 09:17  Cancer Antigen (CA) 125 Latest Ref Range: 0.0 - 38.1 U/mL 24.9 24.6     Impression and Plan:  61-year-old woman  with:  1.  Adenocarcinoma diagnosed in 2014 after presenting with ascites arising from ovarian primary.  She is currently receiving palliative chemotherapy utilizing carboplatin every 3 weeks and has been on a treatment break for 6 weeks.  She tolerated chemotherapy intermittently reasonably well with excellent benefit based on her Ca 125 levels and her last imaging studies done in March 2019.  The natural course of this disease was reviewed again and risks and benefits of continuing chemotherapy was discussed.   The plan is to proceed with chemotherapy today and repeat imaging studies in 3 weeks and will determine the course of action after that.  2. IV access: Port-A-Cath remains in use without any issues or complications.  3. Thrombocytopenia: Related to chemotherapy as well as cirrhosis of the liver.  Platelet count improved today.  4. Left breast cancer: Not on active treatment given her ongoing treatment for ovarian cancer.  Her last mammogram in June 2019 showed no abnormalities.   5. Neutropenia: She will receive growth factor support after each cycle of chemotherapy.  Absolute neutrophil count is adequate today.  6.  Dizziness: Improved at this time ambulating with the help of a cane.  7.  Anemia: Her hemoglobin improved since the last visit.  She is asymptomatic.  8. Followup: She will follow-up in 3 weeks for her next cycle of chemotherapy.  25  minutes was spent with the patient face-to-face today.  More than 50% of time was dedicated to discussing the natural course of her disease, future treatment options as well as coordinating her plan of care.   , MD 8/5/20199:22 AM 

## 2017-09-24 ENCOUNTER — Encounter: Payer: Self-pay | Admitting: *Deleted

## 2017-09-24 ENCOUNTER — Inpatient Hospital Stay: Payer: PPO

## 2017-09-24 DIAGNOSIS — Z5111 Encounter for antineoplastic chemotherapy: Secondary | ICD-10-CM | POA: Diagnosis not present

## 2017-09-24 DIAGNOSIS — C482 Malignant neoplasm of peritoneum, unspecified: Secondary | ICD-10-CM

## 2017-09-24 LAB — CA 125: Cancer Antigen (CA) 125: 27.5 U/mL (ref 0.0–38.1)

## 2017-09-24 MED ORDER — PEGFILGRASTIM INJECTION 6 MG/0.6ML ~~LOC~~
6.0000 mg | PREFILLED_SYRINGE | Freq: Once | SUBCUTANEOUS | Status: AC
  Administered 2017-09-24: 6 mg via SUBCUTANEOUS

## 2017-09-24 MED ORDER — PEGFILGRASTIM INJECTION 6 MG/0.6ML ~~LOC~~
PREFILLED_SYRINGE | SUBCUTANEOUS | Status: AC
  Filled 2017-09-24: qty 0.6

## 2017-10-01 DIAGNOSIS — G4733 Obstructive sleep apnea (adult) (pediatric): Secondary | ICD-10-CM | POA: Diagnosis not present

## 2017-10-10 ENCOUNTER — Other Ambulatory Visit: Payer: Self-pay | Admitting: Oncology

## 2017-10-11 ENCOUNTER — Ambulatory Visit (HOSPITAL_COMMUNITY): Payer: PPO

## 2017-10-11 ENCOUNTER — Ambulatory Visit (HOSPITAL_COMMUNITY)
Admission: RE | Admit: 2017-10-11 | Discharge: 2017-10-11 | Disposition: A | Discharge status: Home / Self Care | Payer: PPO | Source: Ambulatory Visit | Attending: Oncology | Admitting: Oncology

## 2017-10-11 DIAGNOSIS — I700 Atherosclerosis of aorta: Secondary | ICD-10-CM | POA: Insufficient documentation

## 2017-10-11 DIAGNOSIS — R599 Enlarged lymph nodes, unspecified: Secondary | ICD-10-CM | POA: Diagnosis not present

## 2017-10-11 DIAGNOSIS — C50919 Malignant neoplasm of unspecified site of unspecified female breast: Secondary | ICD-10-CM | POA: Insufficient documentation

## 2017-10-11 DIAGNOSIS — C482 Malignant neoplasm of peritoneum, unspecified: Secondary | ICD-10-CM | POA: Diagnosis not present

## 2017-10-11 DIAGNOSIS — R590 Localized enlarged lymph nodes: Secondary | ICD-10-CM | POA: Diagnosis not present

## 2017-10-11 MED ORDER — IOPAMIDOL (ISOVUE-300) INJECTION 61%
INTRAVENOUS | Status: AC
  Filled 2017-10-11: qty 30

## 2017-10-11 MED ORDER — IOHEXOL 300 MG/ML  SOLN
100.0000 mL | Freq: Once | INTRAMUSCULAR | Status: AC | PRN
  Administered 2017-10-11: 100 mL via INTRAVENOUS

## 2017-10-14 ENCOUNTER — Inpatient Hospital Stay: Payer: PPO

## 2017-10-14 ENCOUNTER — Inpatient Hospital Stay (HOSPITAL_BASED_OUTPATIENT_CLINIC_OR_DEPARTMENT_OTHER): Payer: PPO | Admitting: Oncology

## 2017-10-14 ENCOUNTER — Telehealth: Payer: Self-pay

## 2017-10-14 ENCOUNTER — Other Ambulatory Visit: Payer: Self-pay | Admitting: Family Medicine

## 2017-10-14 VITALS — BP 132/48 | HR 60 | Temp 97.8°F | Resp 14 | Ht 61.0 in | Wt 142.9 lb

## 2017-10-14 DIAGNOSIS — C482 Malignant neoplasm of peritoneum, unspecified: Secondary | ICD-10-CM

## 2017-10-14 DIAGNOSIS — D6959 Other secondary thrombocytopenia: Secondary | ICD-10-CM

## 2017-10-14 DIAGNOSIS — Z5111 Encounter for antineoplastic chemotherapy: Secondary | ICD-10-CM | POA: Diagnosis not present

## 2017-10-14 DIAGNOSIS — C50412 Malignant neoplasm of upper-outer quadrant of left female breast: Secondary | ICD-10-CM

## 2017-10-14 DIAGNOSIS — C569 Malignant neoplasm of unspecified ovary: Secondary | ICD-10-CM

## 2017-10-14 DIAGNOSIS — D649 Anemia, unspecified: Secondary | ICD-10-CM | POA: Diagnosis not present

## 2017-10-14 DIAGNOSIS — Z95828 Presence of other vascular implants and grafts: Secondary | ICD-10-CM

## 2017-10-14 DIAGNOSIS — D701 Agranulocytosis secondary to cancer chemotherapy: Secondary | ICD-10-CM | POA: Diagnosis not present

## 2017-10-14 DIAGNOSIS — Z853 Personal history of malignant neoplasm of breast: Secondary | ICD-10-CM | POA: Diagnosis not present

## 2017-10-14 DIAGNOSIS — C50919 Malignant neoplasm of unspecified site of unspecified female breast: Secondary | ICD-10-CM

## 2017-10-14 DIAGNOSIS — Z170 Estrogen receptor positive status [ER+]: Principal | ICD-10-CM

## 2017-10-14 DIAGNOSIS — C786 Secondary malignant neoplasm of retroperitoneum and peritoneum: Secondary | ICD-10-CM | POA: Diagnosis not present

## 2017-10-14 LAB — CMP (CANCER CENTER ONLY)
ALK PHOS: 99 U/L (ref 38–126)
ANION GAP: 7 (ref 5–15)
AST: 14 U/L — ABNORMAL LOW (ref 15–41)
Albumin: 3.3 g/dL — ABNORMAL LOW (ref 3.5–5.0)
BUN: 6 mg/dL (ref 6–20)
CALCIUM: 8.9 mg/dL (ref 8.9–10.3)
CO2: 27 mmol/L (ref 22–32)
CREATININE: 0.66 mg/dL (ref 0.44–1.00)
Chloride: 110 mmol/L (ref 98–111)
Glucose, Bld: 100 mg/dL — ABNORMAL HIGH (ref 70–99)
Potassium: 3.9 mmol/L (ref 3.5–5.1)
SODIUM: 144 mmol/L (ref 135–145)
TOTAL PROTEIN: 7.2 g/dL (ref 6.5–8.1)
Total Bilirubin: 0.2 mg/dL — ABNORMAL LOW (ref 0.3–1.2)

## 2017-10-14 LAB — CBC WITH DIFFERENTIAL (CANCER CENTER ONLY)
Basophils Absolute: 0 10*3/uL (ref 0.0–0.1)
Basophils Relative: 1 %
Eosinophils Absolute: 0.1 10*3/uL (ref 0.0–0.5)
Eosinophils Relative: 2 %
HCT: 26.9 % — ABNORMAL LOW (ref 34.8–46.6)
HEMOGLOBIN: 8.5 g/dL — AB (ref 11.6–15.9)
LYMPHS ABS: 1.1 10*3/uL (ref 0.9–3.3)
LYMPHS PCT: 33 %
MCH: 31.2 pg (ref 25.1–34.0)
MCHC: 31.5 g/dL (ref 31.5–36.0)
MCV: 99 fL (ref 79.5–101.0)
MONOS PCT: 8 %
Monocytes Absolute: 0.3 10*3/uL (ref 0.1–0.9)
NEUTROS PCT: 56 %
Neutro Abs: 1.8 10*3/uL (ref 1.5–6.5)
Platelet Count: 56 10*3/uL — ABNORMAL LOW (ref 145–400)
RBC: 2.72 MIL/uL — AB (ref 3.70–5.45)
RDW: 22.6 % — ABNORMAL HIGH (ref 11.2–14.5)
WBC Count: 3.3 10*3/uL — ABNORMAL LOW (ref 3.9–10.3)

## 2017-10-14 MED ORDER — DIPHENHYDRAMINE HCL 50 MG/ML IJ SOLN
INTRAMUSCULAR | Status: AC
  Filled 2017-10-14: qty 1

## 2017-10-14 MED ORDER — FAMOTIDINE IN NACL 20-0.9 MG/50ML-% IV SOLN
20.0000 mg | Freq: Once | INTRAVENOUS | Status: AC
  Administered 2017-10-14: 20 mg via INTRAVENOUS

## 2017-10-14 MED ORDER — PALONOSETRON HCL INJECTION 0.25 MG/5ML
0.2500 mg | Freq: Once | INTRAVENOUS | Status: AC
  Administered 2017-10-14: 0.25 mg via INTRAVENOUS

## 2017-10-14 MED ORDER — HEPARIN SOD (PORK) LOCK FLUSH 100 UNIT/ML IV SOLN
500.0000 [IU] | Freq: Once | INTRAVENOUS | Status: AC | PRN
  Administered 2017-10-14: 500 [IU]
  Filled 2017-10-14: qty 5

## 2017-10-14 MED ORDER — DEXAMETHASONE SODIUM PHOSPHATE 10 MG/ML IJ SOLN
4.0000 mg | Freq: Once | INTRAMUSCULAR | Status: AC
  Administered 2017-10-14: 4 mg via INTRAVENOUS

## 2017-10-14 MED ORDER — SODIUM CHLORIDE 0.9 % IV SOLN
500.0000 mg | Freq: Once | INTRAVENOUS | Status: AC
  Administered 2017-10-14: 500 mg via INTRAVENOUS
  Filled 2017-10-14: qty 50

## 2017-10-14 MED ORDER — DIPHENHYDRAMINE HCL 50 MG/ML IJ SOLN
50.0000 mg | Freq: Once | INTRAMUSCULAR | Status: AC
  Administered 2017-10-14: 50 mg via INTRAVENOUS

## 2017-10-14 MED ORDER — DEXAMETHASONE SODIUM PHOSPHATE 10 MG/ML IJ SOLN
INTRAMUSCULAR | Status: AC
  Filled 2017-10-14: qty 1

## 2017-10-14 MED ORDER — FAMOTIDINE IN NACL 20-0.9 MG/50ML-% IV SOLN
INTRAVENOUS | Status: AC
  Filled 2017-10-14: qty 50

## 2017-10-14 MED ORDER — SODIUM CHLORIDE 0.9 % IV SOLN
Freq: Once | INTRAVENOUS | Status: AC
  Administered 2017-10-14: 10:00:00 via INTRAVENOUS
  Filled 2017-10-14: qty 250

## 2017-10-14 MED ORDER — SODIUM CHLORIDE 0.9 % IJ SOLN
10.0000 mL | INTRAMUSCULAR | Status: DC | PRN
  Administered 2017-10-14: 10 mL via INTRAVENOUS
  Filled 2017-10-14: qty 10

## 2017-10-14 MED ORDER — PALONOSETRON HCL INJECTION 0.25 MG/5ML
INTRAVENOUS | Status: AC
  Filled 2017-10-14: qty 5

## 2017-10-14 MED ORDER — SODIUM CHLORIDE 0.9% FLUSH
10.0000 mL | INTRAVENOUS | Status: DC | PRN
  Administered 2017-10-14: 10 mL
  Filled 2017-10-14: qty 10

## 2017-10-14 NOTE — Progress Notes (Signed)
Calling Dr. Alen Blew to confirm carboplatin dose for today.   Jalene Mullet, PharmD PGY2 Hematology/ Oncology Pharmacy Resident 10/14/2017 10:01 AM

## 2017-10-14 NOTE — Progress Notes (Signed)
Hematology and Oncology Follow Up Visit  [name] [id number] Jul 23, 1955 62 y.o. 10/14/2017 9:02 AM    Principle Diagnosis: 62 year old woman with:  1.  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  She presented with adenocarcinoma likely arising from ovarian primary.  2.  Left-sided breast cancer presented with invasive ductal carcinoma and positive sentinel lymph node biopsy. The tumor is ER positive PR negative, HER-2 negative diagnosed in May 2017   Prior Therapy:  Carboplatin and Taxotere started on 10/01/2012. Avastin was added with cycle 4. Chemotherapy only discontinued in June of 2015.  She is S/P Avastin maintenance only between June 2015 till March 2017. Therapy discontinued due to progression of disease.  She is status post Doxil salvage chemotherapy therapy discontinued because of hypersensitivity reaction in March 2017.  Current therapy:  Carboplatin and AUC of 5 started on 07/08/2015.  Has been receiving   Interim History: [name] presents today for a follow-up visit.  Since the last visit, she tolerated the last chemotherapy cycle without any complications.  She is experiencing some more anorexia and some mild fatigue as well as diarrhea.  She denied any fevers or chills.  She denies any recent infectious process.  Her appetite is slightly down and of lost some weight.  She denies any falls or syncope.  She does not report any headaches, blurry vision, or seizures.  She denies any dizziness.   She does not report any chest pain, palpitation, orthopnea or leg edema.  Has not reported any shortness of breath or cough. She does not report any vomiting or constipation.   Not any hematochezia or melena.  She does not report any hematuria or dysuria. She does not report any myalgias.  She does not report any lymphadenopathy or petechiae.  She denied any mood changes.  She denies any easy bruising.  Remainder of the review of systems is negative.   Medications: I  have reviewed the patient's current medications.  Current Outpatient Medications  Medication Sig Dispense Refill  . acetaminophen (TYLENOL) 325 MG tablet Take 2 tablets (650 mg total) by mouth every 4 (four) hours as needed for mild pain, fever or headache. 30 tablet 0  . Acetylcarnitine HCl (ACETYL L-CARNITINE PO) Take 2,000 mg by mouth at bedtime.    . ALPRAZolam (XANAX) 0.5 MG tablet Take 0.5 mg by mouth 2 (two) times daily as needed for anxiety.    Marland Kitchen aspirin 81 MG chewable tablet Chew 1 tablet (81 mg total) by mouth daily. 30 tablet 0  . atorvastatin (LIPITOR) 40 MG tablet Take 1 tablet (40 mg total) by mouth daily. 90 tablet 2  . carvedilol (COREG) 3.125 MG tablet Take 1/2 tablet by mouth twice daily with meals. 30 tablet 3  . clotrimazole-betamethasone (LOTRISONE) cream Apply 1 application topically 2 (two) times daily. 30 g 1  . divalproex (DEPAKOTE) 250 MG DR tablet Take by mouth. Take '500mg'$ s in the morning and '750mg'$ s at night    . fluticasone (FLONASE) 50 MCG/ACT nasal spray INHALE 2 SPRAYS INTO EACH NOSTRIL EVERY NIGHT  11  . ipratropium (ATROVENT) 0.06 % nasal spray Place 2 sprays into both nostrils 4 (four) times daily. 15 mL 0  . lamoTRIgine (LAMICTAL) 100 MG tablet Take 100 mg by mouth 2 (two) times daily.    Marland Kitchen levothyroxine (SYNTHROID, LEVOTHROID) 100 MCG tablet Take 1 tablet (100 mcg total) by mouth daily. 90 tablet 1  . lidocaine-prilocaine (EMLA) cream Apply topically as needed. Apply to port with every chemotherapy. Mineral  g 2  . loperamide (IMODIUM A-D) 2 MG tablet Take 2 mg by mouth as needed for diarrhea or loose stools.    Marland Kitchen loratadine (CLARITIN) 10 MG tablet Take 10 mg by mouth daily as needed for allergies (with chemo).    . nitroGLYCERIN (NITROSTAT) 0.4 MG SL tablet Place 1 tablet (0.4 mg total) under the tongue every 5 (five) minutes as needed for chest pain. (Patient not taking: Reported on 09/23/2017) 25 tablet 3  . ondansetron (ZOFRAN ODT) 8 MG disintegrating tablet Take 1  tablet (8 mg total) by mouth every 8 (eight) hours as needed for nausea or vomiting. 20 tablet 1  . ondansetron (ZOFRAN) 8 MG tablet TAKE 1 TABLET BY MOUTH EVERY 8 HOURS AS NEEDED FOR NAUSEA AND VOMITING 30 tablet 2  . pantoprazole (PROTONIX) 40 MG tablet Take 1 tablet (40 mg total) by mouth daily. 90 tablet 1  . prochlorperazine (COMPAZINE) 10 MG tablet Take 1 tablet (10 mg total) by mouth every 6 (six) hours as needed for nausea or vomiting. 30 tablet 0  . rOPINIRole (REQUIP) 0.5 MG tablet Take 3 tablets by mouth at bedtime. 90 tablet 4  . sertraline (ZOLOFT) 50 MG tablet      No current facility-administered medications for this visit.    Facility-Administered Medications Ordered in Other Visits  Medication Dose Route Frequency Provider Last Rate Last Dose  . heparin lock flush 100 unit/mL  500 Units Intravenous Once Wyatt Portela, MD      . sodium chloride flush (NS) 0.9 % injection 10 mL  10 mL Intravenous PRN Wyatt Portela, MD         Allergies:  Allergies  Allergen Reactions  . Doxil [Doxorubicin Hcl Liposomal] Anaphylaxis    1st Doxil.   . Pollen Extract Other (See Comments)    Pollen and grass causes a lot sneezing    Past Medical History, Surgical history, Social history, and Family History reviewed and unchanged.    Physical Exam:  Blood pressure (!) 132/48, pulse 60, temperature 97.8 F (36.6 C), temperature source Oral, resp. rate 14, height '5\' 1"'$  (1.549 m), weight 64.8 kg, SpO2 100 %.   General appearance: Comfortable appearing without any discomfort Head: Normocephalic without any trauma Oropharynx: Mucous membranes are moist and pink without any thrush or ulcers. Eyes: Pupils are equal and round reactive to light. Lymph nodes: No cervical, supraclavicular, inguinal or axillary lymphadenopathy.   Heart:regular rate and rhythm.  S1 and S2 without leg edema. Lung: Clear without any rhonchi or wheezes.  No dullness to percussion. Abdomin: Soft, nontender,  nondistended with good bowel sounds.  No hepatosplenomegaly. Musculoskeletal: No joint deformity or effusion.  Full range of motion noted. Neurological: No deficits noted on motor, sensory and deep tendon reflex exam. Skin: No petechial rash or dryness.  Appeared moist.  Psychiatric: Mood and affect appeared appropriate.      Lab Results: Lab Results  Component Value Date   WBC 3.3 (L) 10/14/2017   HGB 8.5 (L) 10/14/2017   HCT 26.9 (L) 10/14/2017   MCV 99.0 10/14/2017   PLT 56 (L) 10/14/2017     Chemistry      Component Value Date/Time   NA 142 09/23/2017 0915   NA 142 01/21/2017 0822   K 4.1 09/23/2017 0915   K 4.1 01/21/2017 0822   CL 106 09/23/2017 0915   CO2 26 09/23/2017 0915   CO2 27 01/21/2017 0822   BUN 11 09/23/2017 0915   BUN 12.2 01/21/2017  8182   CREATININE 0.71 09/23/2017 0915   CREATININE 0.7 01/21/2017 0822      Component Value Date/Time   CALCIUM 8.9 09/23/2017 0915   CALCIUM 8.9 01/21/2017 0822   ALKPHOS 82 09/23/2017 0915   ALKPHOS 75 01/21/2017 0822   AST 17 09/23/2017 0915   AST 12 01/21/2017 0822   ALT 8 09/23/2017 0915   ALT 7 01/21/2017 0822   BILITOT 0.3 09/23/2017 0915   BILITOT 0.23 01/21/2017 0822       EXAM: CT CHEST, ABDOMEN, AND PELVIS WITH CONTRAST  TECHNIQUE: Multidetector CT imaging of the chest, abdomen and pelvis was performed following the standard protocol during bolus administration of intravenous contrast.  CONTRAST:  128m OMNIPAQUE IOHEXOL 300 MG/ML  SOLN  COMPARISON:  04/22/2017  FINDINGS: CT CHEST FINDINGS  Cardiovascular: The heart size is normal. No substantial pericardial effusion. Coronary artery calcification is evident. Atherosclerotic calcification is noted in the wall of the thoracic aorta. Right Port-A-Cath tip is positioned in the distal SVC.  Mediastinum/Nodes: 11 mm short axis subcarinal lymph node is new in the interval. 8 mm short axis right paratracheal lymph node is mildly  progressive in the interval. There is no hilar lymphadenopathy. The esophagus has normal imaging features. There is no axillary lymphadenopathy. Surgical clips noted right axilla.  Lungs/Pleura: No suspicious pulmonary nodule or mass. No focal airspace consolidation. No pulmonary edema or pleural effusion.  Musculoskeletal: No worrisome lytic or sclerotic osseous abnormality.  CT ABDOMEN PELVIS FINDINGS  Hepatobiliary: No focal abnormality within the liver parenchyma. There is no evidence for gallstones, gallbladder wall thickening, or pericholecystic fluid. No intrahepatic or extrahepatic biliary dilation.  Pancreas: No focal mass lesion. No dilatation of the main duct. No intraparenchymal cyst. No peripancreatic edema.  Spleen: No splenomegaly. 12.2 cm craniocaudal length. No focal mass lesion.  Adrenals/Urinary Tract: No adrenal nodule or mass. Tiny hypoattenuating lesion lower pole right kidney is stable. Left kidney unremarkable. No evidence for hydroureter. The urinary bladder appears normal for the degree of distention.  Stomach/Bowel: Stomach is nondistended. No gastric wall thickening. No evidence of outlet obstruction. Duodenum is normally positioned as is the ligament of Treitz. No small bowel wall thickening. No small bowel dilatation. The terminal ileum is normal. The appendix is not visualized, but there is no edema or inflammation in the region of the cecum. No gross colonic mass. No colonic wall thickening. No substantial diverticular change.  Vascular/Lymphatic: There is abdominal aortic atherosclerosis without aneurysm. No gastrohepatic or hepato duodenal ligament lymphadenopathy. Retroperitoneal lymph nodes have progressed in the interval. 1.6 cm short axis left para-aortic lymph node was 0.7 cm previously. 10 mm short axis right common iliac node measured previously now represents a 1.5 x 3.0 cm rim enhancing structure invading posteriorly along  the medial psoas muscle (2:83-85). Necrotic nodal conglomeration along the right pelvic sidewall measures 3.6 x 2.0 cm today compared to 2.8 x 2.3 cm previously.  Reproductive: The uterus has normal CT imaging appearance. There is no adnexal mass.  Other: Trace free fluid noted along the anterior liver. Bilateral omental disease is not substantially changed in the interval. Left omental lesion measured previously on coronal imaging at 10.1 x 3.4 cm now measures 10.3 x 3.1 cm. 3.4 cm lesion measured on axial imaging adjacent to the hepatic flexure is 3.6 cm today.  Musculoskeletal: No worrisome lytic or sclerotic osseous abnormality.  IMPRESSION: 1. Interval progression of upper normal mediastinal lymphadenopathy. Evolving metastatic disease a concern and close attention on follow-up recommended. 2.  Progressive retroperitoneal and right pelvic sidewall lymphadenopathy, most notably in the right common and external iliac chains. 3. The omental disease is similar to prior. 4. Trace fluid adjacent to the liver. 5.  Aortic Atherosclerois (ICD10-170.0)   Results for [name], [name] (MRN [id number]) as of 10/14/2017 09:26  Ref. Range 07/23/2017 09:30 08/12/2017 09:17 09/23/2017 09:15  Cancer Antigen (CA) 125 Latest Ref Range: 0.0 - 38.1 U/mL 24.9 24.6 27.5     Impression and Plan:  62 year old woman with:  1.  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  She is currently receiving carboplatin single agent since 2017 with reasonable response to therapy.  CT scan obtained on 23 2019 was personally reviewed and showed mild progression of disease.  There is slight increase in her adenopathy although stable peritoneal metastasis.  Her Ca125 have mildly increased as well but overall has been relatively stable last 2 years.  Her Ca125 was as high as 189 in July 2017 and currently has been around the 27 range on carboplatin.  Options of  therapy were reviewed today with the patient and her husband.  These options include continuing single agent carboplatin, switching to a different systemic chemotherapy such as single agent gemcitabine, vinorelbine, Topotecan or oral PARP inhibitors.  Sending tissue diagnosis for next generation sequencing would be also an option.  After discussion today, I asked her to see Dr. Fermin Schwab for another evaluation to get his opinion on this matter and will proceed with carboplatin today and potentially switch with the next visit.  She is okay with this plan at this time.  2. IV access: Port-A-Cath in use without any complications.  3. Thrombocytopenia: None remained stable above 50,000.  This is related to liver disease as well as chemotherapy.  4. Left breast cancer: No local treatment has been recommended given her advanced peritoneal metastasis.   5. Neutropenia: Absolute neutrophil count is 1800 and she will receive growth factor support after each cycle of chemotherapy.  6.  Anemia: Her hemoglobin remains stable and will consider packed red cell transfusion if her hemoglobin below 8.  8. Followup: She will follow-up in 3 weeks for the next cycle of chemotherapy versus switching her to a different agent.  25  minutes was spent with the patient face-to-face today.  More than 50% of time was dedicated to reviewing imaging studies, discussing the natural course of her disease, reviewing her laboratory data and discussing alternative treatment options.  Zola Button, MD 8/26/20199:02 AM

## 2017-10-14 NOTE — Progress Notes (Signed)
Per Dr. Alen Blew okay to treat with plt of 56

## 2017-10-14 NOTE — Patient Instructions (Signed)
[facility] Discharge Instructions for Patients Receiving Chemotherapy  Today you received the following chemotherapy agents Carboplatin  To help prevent nausea and vomiting after your treatment, we encourage you to take your nausea medication as directed   If you develop nausea and vomiting that is not controlled by your nausea medication, call the clinic.   BELOW ARE SYMPTOMS THAT SHOULD BE REPORTED IMMEDIATELY:  *FEVER GREATER THAN 100.5 F  *CHILLS WITH OR WITHOUT FEVER  NAUSEA AND VOMITING THAT IS NOT CONTROLLED WITH YOUR NAUSEA MEDICATION  *UNUSUAL SHORTNESS OF BREATH  *UNUSUAL BRUISING OR BLEEDING  TENDERNESS IN MOUTH AND THROAT WITH OR WITHOUT PRESENCE OF ULCERS  *URINARY PROBLEMS  *BOWEL PROBLEMS  UNUSUAL RASH Items with * indicate a potential emergency and should be followed up as soon as possible.  Feel free to call the clinic should you have any questions or concerns. The clinic phone number is [phone].  Please show the CHEMO ALERT CARD at check-in to the Emergency Department and triage nurse.   

## 2017-10-14 NOTE — Telephone Encounter (Signed)
Called Oncology/Breast clinic and left a message for Sharyn Lull to schedule patient. 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 Per 8/26 no los

## 2017-10-14 NOTE — Telephone Encounter (Signed)
Please advise if ok to refill, pt last seen on 07/18/2017 and last refill was 05/20/2017 for 90 tablets with 4 refills.

## 2017-10-14 NOTE — Progress Notes (Signed)
Per Dr. Alen Blew to adjust carboplatin dose today with recent weight loss. New dose calculated using current weight, Scr 0.8, and AUC=5.  Jalene Mullet, PharmD PGY2 Hematology/ Oncology Pharmacy Resident 10/14/2017 10:11 AM

## 2017-10-15 ENCOUNTER — Inpatient Hospital Stay: Payer: PPO

## 2017-10-15 DIAGNOSIS — C482 Malignant neoplasm of peritoneum, unspecified: Secondary | ICD-10-CM

## 2017-10-15 DIAGNOSIS — Z5111 Encounter for antineoplastic chemotherapy: Secondary | ICD-10-CM | POA: Diagnosis not present

## 2017-10-15 LAB — CA 125: Cancer Antigen (CA) 125: 32.1 U/mL (ref 0.0–38.1)

## 2017-10-15 MED ORDER — PEGFILGRASTIM INJECTION 6 MG/0.6ML ~~LOC~~
PREFILLED_SYRINGE | SUBCUTANEOUS | Status: AC
  Filled 2017-10-15: qty 0.6

## 2017-10-15 MED ORDER — PEGFILGRASTIM INJECTION 6 MG/0.6ML ~~LOC~~
6.0000 mg | PREFILLED_SYRINGE | Freq: Once | SUBCUTANEOUS | Status: AC
  Administered 2017-10-15: 6 mg via SUBCUTANEOUS

## 2017-10-17 ENCOUNTER — Other Ambulatory Visit: Payer: Self-pay | Admitting: Family Medicine

## 2017-10-17 NOTE — Telephone Encounter (Signed)
Copied from [place] [id number]. Topic: Quick Communication - Rx Refill/Question >> Oct 17, 2017  5:39 PM Waylan Rocher, [place] L wrote: Medication: rOPINIRole (REQUIP) 0.5 MG tablet   Has the patient contacted their pharmacy? Yes.   (Agent: If no, request that the patient contact the pharmacy for the refill.) (Agent: If yes, when and what did the pharmacy advise?)  Preferred Pharmacy (with phone number or street name): [facility], [address] [place] [address] Phone: [phone] Fax: [phone] op#[phone]  Agent: Please be advised that RX refills may take up to 3 business days. We ask that you follow-up with your pharmacy.

## 2017-10-17 NOTE — Telephone Encounter (Signed)
Refill for requip  LOV 07/18/17 Dr. Volanda Napoleon  [facility] 05/20/17  #[phone] 4 refills   [place], [place] - [address] [place] [address] Phone: [phone] Fax: [phone] op#[phone]

## 2017-10-18 MED ORDER — ROPINIROLE HCL 0.5 MG PO TABS: 1.5000 mg | ORAL_TABLET | Freq: Every day | ORAL | 4 refills | Status: DC

## 2017-10-26 DIAGNOSIS — J40 Bronchitis, not specified as acute or chronic: Secondary | ICD-10-CM

## 2017-10-26 HISTORY — DX: Bronchitis, not specified as acute or chronic: J40

## 2017-10-28 ENCOUNTER — Ambulatory Visit: Payer: Self-pay

## 2017-10-28 NOTE — Telephone Encounter (Signed)
Please Advise

## 2017-10-28 NOTE — Telephone Encounter (Signed)
   Answer Assessment - Initial Assessment Questions 1. REASON FOR CALL or QUESTION: "What is your reason for calling today?" or "How can I best help you?" or "What question do you have that I can help answer?"   Patient calling to see if it is alright if she takes  Protocols used: INFORMATION ONLY CALL-A-AH

## 2017-10-28 NOTE — Telephone Encounter (Signed)
Incoming call from patient inquiring if it is alright if she may take Benzonate  And azithromycin? Insurance line nurse told her she probably has bronchitis with an upper respiratory infection.  Called in those two Rx.  Patient wanted to know if she could take the two medications and if she needs any other treatment? Patient would like a return call from office.    Reason for Disposition . [1] Follow-up call to recent contact AND [2] information only call, no triage required  Answer Assessment - Initial Assessment Questions 1. REASON FOR CALL or QUESTION: "What is your reason for calling today?" or "How can I best help you?" or "What question do you have that I can help answer?"   Patient calling to see if it is alright if she takes Benzonate and arithromycyn? Or if she needs some other treatment.  Protocols used: INFORMATION ONLY CALL-A-AH

## 2017-10-29 ENCOUNTER — Other Ambulatory Visit: Payer: Self-pay | Admitting: Oncology

## 2017-10-29 ENCOUNTER — Ambulatory Visit: Payer: PPO | Admitting: Gynecology

## 2017-10-29 ENCOUNTER — Inpatient Hospital Stay: Payer: PPO | Attending: Oncology | Admitting: Gynecology

## 2017-10-29 ENCOUNTER — Encounter: Payer: Self-pay | Admitting: Gynecology

## 2017-10-29 VITALS — BP 139/52 | HR 62 | Temp 98.4°F | Resp 20 | Ht 61.0 in | Wt 139.5 lb

## 2017-10-29 DIAGNOSIS — C786 Secondary malignant neoplasm of retroperitoneum and peritoneum: Secondary | ICD-10-CM | POA: Insufficient documentation

## 2017-10-29 DIAGNOSIS — Z7982 Long term (current) use of aspirin: Secondary | ICD-10-CM | POA: Insufficient documentation

## 2017-10-29 DIAGNOSIS — Z923 Personal history of irradiation: Secondary | ICD-10-CM | POA: Diagnosis not present

## 2017-10-29 DIAGNOSIS — Z853 Personal history of malignant neoplasm of breast: Secondary | ICD-10-CM | POA: Insufficient documentation

## 2017-10-29 DIAGNOSIS — D696 Thrombocytopenia, unspecified: Secondary | ICD-10-CM

## 2017-10-29 DIAGNOSIS — Z5189 Encounter for other specified aftercare: Secondary | ICD-10-CM | POA: Insufficient documentation

## 2017-10-29 DIAGNOSIS — C482 Malignant neoplasm of peritoneum, unspecified: Secondary | ICD-10-CM | POA: Diagnosis not present

## 2017-10-29 DIAGNOSIS — D649 Anemia, unspecified: Secondary | ICD-10-CM | POA: Diagnosis not present

## 2017-10-29 DIAGNOSIS — D6959 Other secondary thrombocytopenia: Secondary | ICD-10-CM | POA: Diagnosis not present

## 2017-10-29 DIAGNOSIS — Z5111 Encounter for antineoplastic chemotherapy: Secondary | ICD-10-CM | POA: Insufficient documentation

## 2017-10-29 DIAGNOSIS — C569 Malignant neoplasm of unspecified ovary: Secondary | ICD-10-CM | POA: Insufficient documentation

## 2017-10-29 DIAGNOSIS — Z5112 Encounter for antineoplastic immunotherapy: Secondary | ICD-10-CM | POA: Diagnosis not present

## 2017-10-29 DIAGNOSIS — Z9221 Personal history of antineoplastic chemotherapy: Secondary | ICD-10-CM | POA: Diagnosis not present

## 2017-10-29 DIAGNOSIS — Z79899 Other long term (current) drug therapy: Secondary | ICD-10-CM | POA: Insufficient documentation

## 2017-10-29 DIAGNOSIS — J40 Bronchitis, not specified as acute or chronic: Secondary | ICD-10-CM | POA: Insufficient documentation

## 2017-10-29 DIAGNOSIS — D701 Agranulocytosis secondary to cancer chemotherapy: Secondary | ICD-10-CM | POA: Insufficient documentation

## 2017-10-29 NOTE — Patient Instructions (Signed)
Dr. Fermin Schwab recommends gemcitabine with Avastin.  Other options discussed were weekly taxol, etoposide, topotecan.

## 2017-10-29 NOTE — Progress Notes (Signed)
Consult Note: Gyn-Onc   [name] 62 y.o. female  Chief Complaint  Patient presents with  . 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  Given the clear-cut progressive disease based on recent CT scan, I would agree that making a change in therapy is appropriate at this time.  Further the patient is starting to have some mild abdominal symptoms most likely associated with her intraperitoneal disease.  Chronic thrombocytopenia  Plan: Treatment options were discussed with the patient and her husband.  I indicated that I felt strongly that a change in therapy is most appropriate.  Treatment options discussed include  Single agent gemcitabine, oral etoposide, weekly topotecan, single agent Taxotere, oral etoposide, and weekly Taxol.  Further, I would recommend adding bevacizumab to most of the intravenous regimens.  At the conclusion of the consultation, I would recommend strongly considering a combination of single agent gemcitabine combined with bevacizumab.  The patient will return to Dr. Alen Blew to discuss this recommendation.   HPI: Patient seen today in consultation at the request of Dr. Alen Blew regarding management options for progressive primary peritoneal carcinoma. The patient initially presented in July 2014 with ascites and an omental cake. Paracentesis and biopsy fountain adenocarcinoma consistent with a primary peritoneal married. The patient was initially treated with carboplatin and Taxotere (Taxotere was substituted for Taxol because of neuropathy). After 3 cycles she had relatively stable disease and a Avastin was added. Following completion of chemotherapy the patient was placed on maintenance Avastin in June 2015 through March 2017. That time her CA-125 had risen slightly to 100 units per mL. CT  scan showed diffuse carcinomatosis, omental cake, and some ascites..  Second line therapy was initiated on 05/20/2015 using Doxil. Unfortunately, the patient had a profound hypersensitivity reaction.  Genetic testing showed no actionable mutations.  Specifically she is not BRCA1 or 2.  In May 2017 the patient was switched to single agent carboplatin.  Through October 15, 2017 she had a total of 38 cycles of single agent carboplatin.  It is noted that she had significant thrombocytopenia throughout ranging in the range of 50,000.  In August the patient Ca1 25 became slightly more elevated (32 units/mL versus prior of 27 and 26 units/mL).  A CT scan showed progressive disease in comparison to the prior CT scan on April 22, 2017.  Some examples include a para-aortic lymph node now measuring 1.6 cm (previously 0.7 cm) a pelvic sidewall mass measuring 3.6 x 2 cm (previously 2.8 x 2.3 cm).  An omental mass was relatively stable measuring 10.1 x 3.4 cm.   Review of Systems:10 point review of systems is negative except as noted in interval history.   Vitals: Blood pressure (!) 139/52, pulse 62, temperature 98.4 F (36.9 C), temperature source Oral, resp. rate 20, height _0  (1.549 m), weight 139 lb 8 oz (63.3 kg), SpO2 100 %.  Physical Exam: General : The patient is a healthy woman in no acute distress.  HEENT: normocephalic, extraoccular movements normal; neck is supple without thyromegally  Lynphnodes: Supraclavicular and inguinal nodes not enlarged  Abdomen: Soft, non-tender, no ascites, no organomegally, no masses, no hernias  Pelvic:  EGBUS: Normal female  Vagina: Normal, no lesions  Urethra and Bladder: Normal, non-tender  Cervix: Surgically absent  Uterus: Surgically absent  Bi-manual examination: Non-tender; no adenxal masses or nodularity  Rectal: normal sphincter tone, no  masses, no blood  Lower extremities: No edema or varicosities. Normal range of motion      Allergies  Allergen  Reactions  . Doxil [Doxorubicin Hcl Liposomal] Anaphylaxis    1st Doxil.   . Pollen Extract Other (See Comments)    Pollen and grass causes a lot sneezing    Past Medical History:  Diagnosis Date  . ADD (attention deficit disorder)   . Allergy    seasonal  . Anxiety   . Bipolar disorder ([place])   . Breast cancer ([place]) 1989   right breast  . Bronchitis 10/26/2017  . Cancer ([place])    Omentum  . Coronary artery disease   . Depression   . Hematochezia   . History of echocardiogram    a. Limited Echo 9/17: EF 60-65%, no RWMA, Gr 1 DD, trivial AI, trivial MR, GLS -12% (likely underestimated), no pericardial effusion  . Hyperlipidemia   . Hypothyroidism   . Maintenance chemotherapy    Pt has chemo every 3 weeks (on Friday)  . NSTEMI (non-ST elevated myocardial infarction) ([place]) 10/26/2015  . Osteopenia 03/2009   t score -2.1 FRAX 4.6/0.4  . Personal history of chemotherapy   . Personal history of radiation therapy   . Premature menopause   . Sleep apnea    mild  . Urinary incontinence     Past Surgical History:  Procedure Laterality Date  . BREAST BIOPSY Left 07/07/2015   malignant x 2  . BREAST LUMPECTOMY Right 1989  . BREAST SURGERY  1989   RIGHT LUMPECTOMY, RADIATION AND CHEMO  . CARDIAC CATHETERIZATION N/A 10/26/2015   Procedure: Left Heart Cath and Coronary Angiography;  Surgeon: Wellington Hampshire, MD;  Location: [facility];  Service: Cardiovascular;  Laterality: N/A;  . CARDIAC CATHETERIZATION N/A 10/26/2015   Procedure: Coronary Stent Intervention;  Surgeon: Wellington Hampshire, MD;  Location: [facility];  Service: Cardiovascular;  Laterality: N/A;  . CARDIAC CATHETERIZATION N/A 11/08/2015   Procedure: Left Heart Cath and Coronary Angiography;  Surgeon: Jolaine Artist, MD;  Location: [facility];  Service: Cardiovascular;  Laterality: N/A;  . drug eluting stent  10/26/2015  . FOOT SURGERY  2013   BILATERAL   . HYSTEROSCOPY  2011   [place]  .  PELVIC LAPAROSCOPY/ Hysteroscopy  1996    Current Outpatient Medications  Medication Sig Dispense Refill  . acetaminophen (TYLENOL) 325 MG tablet Take 2 tablets (650 mg total) by mouth every 4 (four) hours as needed for mild pain, fever or headache. 30 tablet 0  . Acetylcarnitine HCl (ACETYL L-CARNITINE PO) Take 2,000 mg by mouth at bedtime.    . ALPRAZolam (XANAX) 0.5 MG tablet Take 0.5 mg by mouth 2 (two) times daily as needed for anxiety.    Marland Kitchen aspirin 81 MG chewable tablet Chew 1 tablet (81 mg total) by mouth daily. 30 tablet 0  . atorvastatin (LIPITOR) 40 MG tablet Take 1 tablet (40 mg total) by mouth daily. 90 tablet 2  . benzonatate (TESSALON) 100 MG capsule Take by mouth 2 (two) times daily as needed for cough. Take one capsule by mouth twice a  Day as needed for cough    . carvedilol (COREG) 3.125 MG tablet Take 1/2 tablet by mouth twice daily with meals. 30 tablet 3  . clotrimazole-betamethasone (LOTRISONE) cream Apply 1 application topically 2 (two) times daily. 30 g 1  . divalproex (DEPAKOTE) 250 MG DR tablet Take by mouth. Take '500mg'$ s in the morning and  $'750mg'u$ s at night    . fluticasone (FLONASE) 50 MCG/ACT nasal spray INHALE 2 SPRAYS INTO EACH NOSTRIL EVERY NIGHT  11  . ipratropium (ATROVENT) 0.06 % nasal spray Place 2 sprays into both nostrils 4 (four) times daily. 15 mL 0  . lamoTRIgine (LAMICTAL) 100 MG tablet Take 100 mg by mouth 2 (two) times daily.    Marland Kitchen levothyroxine (SYNTHROID, LEVOTHROID) 100 MCG tablet Take 1 tablet (100 mcg total) by mouth daily. 90 tablet 1  . lidocaine-prilocaine (EMLA) cream Apply topically as needed. Apply to port with every chemotherapy. 30 g 2  . loperamide (IMODIUM A-D) 2 MG tablet Take 2 mg by mouth as needed for diarrhea or loose stools.    Marland Kitchen loratadine (CLARITIN) 10 MG tablet Take 10 mg by mouth daily as needed for allergies (with chemo).    . nitroGLYCERIN (NITROSTAT) 0.4 MG SL tablet Place 1 tablet (0.4 mg total) under the tongue every 5 (five)  minutes as needed for chest pain. 25 tablet 3  . ondansetron (ZOFRAN ODT) 8 MG disintegrating tablet Take 1 tablet (8 mg total) by mouth every 8 (eight) hours as needed for nausea or vomiting. 20 tablet 1  . ondansetron (ZOFRAN) 8 MG tablet TAKE 1 TABLET BY MOUTH EVERY 8 HOURS AS NEEDED FOR NAUSEA AND VOMITING 30 tablet 2  . pantoprazole (PROTONIX) 40 MG tablet Take 1 tablet (40 mg total) by mouth daily. 90 tablet 0  . prochlorperazine (COMPAZINE) 10 MG tablet Take 1 tablet (10 mg total) by mouth every 6 (six) hours as needed for nausea or vomiting. 30 tablet 0  . rOPINIRole (REQUIP) 0.5 MG tablet Take 3 tablets (1.5 mg total) by mouth at bedtime. 90 tablet 4  . azithromycin (ZITHROMAX) 250 MG tablet Take 1 tablet by mouth. Take 2 tablets by mouth today,then take 1 tablet daily for 4 days (Started medication on 10/26/17)    . sertraline (ZOLOFT) 50 MG tablet Take by mouth daily.      No current facility-administered medications for this visit.    Facility-Administered Medications Ordered in Other Visits  Medication Dose Route Frequency Provider Last Rate Last Dose  . heparin lock flush 100 unit/mL  500 Units Intravenous Once Wyatt Portela, MD      . sodium chloride flush (NS) 0.9 % injection 10 mL  10 mL Intravenous PRN Wyatt Portela, MD        Social History   Socioeconomic History  . Marital status: Married    Spouse name: [name]  . Number of children: 0  . Years of education: 6  . Highest education level: Not on file  Occupational History  . Occupation: unemployed    Fish farm manager: [employer], [employer]  Social Needs  . Financial resource strain: Not on file  . Food insecurity:    Worry: Not on file    Inability: Not on file  . Transportation needs:    Medical: Not on file    Non-medical: Not on file  Tobacco Use  . Smoking status: Never Smoker  . Smokeless tobacco: Never Used  Substance and Sexual Activity  . Alcohol use: Yes    Alcohol/week: 0.0 standard drinks     Comment: very rarely - maybe 1 glass wine every 3 mos  . Drug use: No  . Sexual activity: Not Currently    Birth control/protection: Post-menopausal    Comment: 1st intercourse 67 yo-5 partners  Lifestyle  . Physical activity:    Days per week: Not on  file    Minutes per session: Not on file  . Stress: Not on file  Relationships  . Social connections:    Talks on phone: Not on file    Gets together: Not on file    Attends religious service: Not on file    Active member of club or organization: Not on file    Attends meetings of clubs or organizations: Not on file    Relationship status: Not on file  . Intimate partner violence:    Fear of current or ex partner: Not on file    Emotionally abused: Not on file    Physically abused: Not on file    Forced sexual activity: Not on file  Other Topics Concern  . Not on file  Social History Narrative   Lives at home with husband.    Caffeine use:  Tea/soda occass    Family History  Problem Relation Age of Onset  . Breast cancer Maternal Aunt 29  . Diabetes Maternal Grandmother   . Heart disease Maternal Grandmother   . Heart disease Maternal Grandfather   . Hypertension Paternal Grandfather   . Heart Problems Paternal Grandfather   . Breast cancer Maternal Aunt        dx. early 71s; had negative GT approx 10 years ago  . Breast cancer Maternal Aunt        dx. 53s with recurrence  . Allergies Father   . Heart disease Mother   . Other Mother        hx of hysterectomy   . Liver cancer Cousin 28       +EtOH  . Cancer Cousin        paternal 1st cousin, once-removed dx. NOS cancer (maybe ovarian) in late 30s-40s  . Cancer Cousin        female paternal 2nd cousin d. of NOS cancer in her 20s-early 31s  . Ovarian cancer Neg Hx   . Colon cancer Neg Hx       Marti Sleigh, MD 10/29/2017, 1:42 PM

## 2017-10-29 NOTE — Progress Notes (Signed)
START ON PATHWAY REGIMEN - Ovarian     A cycle is every 21 days:     Bevacizumab-xxxx   **Always confirm dose/schedule in your pharmacy ordering system**  Patient Characteristics: Recurrent or Progressive Disease, [facility], BRCA Mutation Absent/Unknown Therapeutic Status: Recurrent or Progressive Disease BRCA Mutation Status: Absent Line of Therapy: [facility]  Intent of Therapy: Non-Curative / Palliative Intent, Discussed with Patient

## 2017-11-01 ENCOUNTER — Encounter: Payer: PPO | Admitting: Family Medicine

## 2017-11-01 ENCOUNTER — Ambulatory Visit (INDEPENDENT_AMBULATORY_CARE_PROVIDER_SITE_OTHER): Payer: PPO | Admitting: Adult Health

## 2017-11-01 ENCOUNTER — Encounter: Payer: Self-pay | Admitting: Adult Health

## 2017-11-01 ENCOUNTER — Other Ambulatory Visit: Payer: Self-pay | Admitting: *Deleted

## 2017-11-01 VITALS — BP 110/80 | Temp 99.2°F | Wt 140.0 lb

## 2017-11-01 DIAGNOSIS — Z95828 Presence of other vascular implants and grafts: Secondary | ICD-10-CM

## 2017-11-01 DIAGNOSIS — G4733 Obstructive sleep apnea (adult) (pediatric): Secondary | ICD-10-CM | POA: Diagnosis not present

## 2017-11-01 DIAGNOSIS — J069 Acute upper respiratory infection, unspecified: Secondary | ICD-10-CM | POA: Diagnosis not present

## 2017-11-01 MED ORDER — PREDNISONE 20 MG PO TABS: 10.0000 mg | ORAL_TABLET | Freq: Every day | ORAL | 0 refills | Status: DC

## 2017-11-01 MED ORDER — BENZONATATE 200 MG PO CAPS: 200.0000 mg | ORAL_CAPSULE | Freq: Two times a day (BID) | ORAL | 0 refills | Status: DC | PRN

## 2017-11-01 MED ORDER — IPRATROPIUM-ALBUTEROL 0.5-2.5 (3) MG/3ML IN SOLN
3.0000 mL | Freq: Once | RESPIRATORY_TRACT | Status: AC
  Administered 2017-11-01: 3 mL via RESPIRATORY_TRACT

## 2017-11-01 NOTE — Progress Notes (Signed)
Subjective:    Patient ID: [name], female    DOB: 1955-11-07, 62 y.o.   MRN: [id number]  HPI  62 year old female who  has a past medical history of ADD (attention deficit disorder), Allergy, Anxiety, Bipolar disorder ([place]), Breast cancer ([place]) (1989), Bronchitis (10/26/2017), Cancer ([employer]), Coronary artery disease, Depression, Hematochezia, History of echocardiogram, Hyperlipidemia, Hypothyroidism, Maintenance chemotherapy, NSTEMI (non-ST elevated myocardial infarction) ([place]) (10/26/2015), Osteopenia (03/2009), Personal history of chemotherapy, Personal history of radiation therapy, Premature menopause, Sleep apnea, and Urinary incontinence.   She presents to the office today for an acute issue. Report that she was treated last week for bronchitis by a tele doc company and prescribed azithromycin. She reports feeling improved but continues to have a non productive cough and chest congestion. She denies any fevers at home , sinus pain or pressure, or shortness of breath.   Currently doing chemo for ovarian cancer.   Review of Systems See HPI   Past Medical History:  Diagnosis Date  . ADD (attention deficit disorder)   . Allergy    seasonal  . Anxiety   . Bipolar disorder ([place])   . Breast cancer ([place]) 1989   right breast  . Bronchitis 10/26/2017  . Cancer ([place])    Omentum  . Coronary artery disease   . Depression   . Hematochezia   . History of echocardiogram    a. Limited Echo 9/17: EF 60-65%, no RWMA, Gr 1 DD, trivial AI, trivial MR, GLS -12% (likely underestimated), no pericardial effusion  . Hyperlipidemia   . Hypothyroidism   . Maintenance chemotherapy    Pt has chemo every 3 weeks (on Friday)  . NSTEMI (non-ST elevated myocardial infarction) ([place]) 10/26/2015  . Osteopenia 03/2009   t score -2.1 FRAX 4.6/0.4  . Personal history of chemotherapy   . Personal history of radiation therapy   . Premature menopause   . Sleep apnea    mild  . Urinary incontinence      Social History   Socioeconomic History  . Marital status: Married    Spouse name: [name]  . Number of children: 0  . Years of education: 64  . Highest education level: Not on file  Occupational History  . Occupation: unemployed    Fish farm manager: [employer], [employer]  Social Needs  . Financial resource strain: Not on file  . Food insecurity:    Worry: Not on file    Inability: Not on file  . Transportation needs:    Medical: Not on file    Non-medical: Not on file  Tobacco Use  . Smoking status: Never Smoker  . Smokeless tobacco: Never Used  Substance and Sexual Activity  . Alcohol use: Yes    Alcohol/week: 0.0 standard drinks    Comment: very rarely - maybe 1 glass wine every 3 mos  . Drug use: No  . Sexual activity: Not Currently    Birth control/protection: Post-menopausal    Comment: 1st intercourse 38 yo-5 partners  Lifestyle  . Physical activity:    Days per week: Not on file    Minutes per session: Not on file  . Stress: Not on file  Relationships  . Social connections:    Talks on phone: Not on file    Gets together: Not on file    Attends religious service: Not on file    Active member of club or organization: Not on file    Attends meetings of clubs or organizations: Not on file  Relationship status: Not on file  . Intimate partner violence:    Fear of current or ex partner: Not on file    Emotionally abused: Not on file    Physically abused: Not on file    Forced sexual activity: Not on file  Other Topics Concern  . Not on file  Social History Narrative   Lives at home with husband.    Caffeine use:  Tea/soda occass    Past Surgical History:  Procedure Laterality Date  . BREAST BIOPSY Left 07/07/2015   malignant x 2  . BREAST LUMPECTOMY Right 1989  . BREAST SURGERY  1989   RIGHT LUMPECTOMY, RADIATION AND CHEMO  . CARDIAC CATHETERIZATION N/A 10/26/2015   Procedure: Left Heart Cath and Coronary Angiography;  Surgeon: Wellington Hampshire, MD;   Location: [facility];  Service: Cardiovascular;  Laterality: N/A;  . CARDIAC CATHETERIZATION N/A 10/26/2015   Procedure: Coronary Stent Intervention;  Surgeon: Wellington Hampshire, MD;  Location: [facility];  Service: Cardiovascular;  Laterality: N/A;  . CARDIAC CATHETERIZATION N/A 11/08/2015   Procedure: Left Heart Cath and Coronary Angiography;  Surgeon: Jolaine Artist, MD;  Location: [facility];  Service: Cardiovascular;  Laterality: N/A;  . drug eluting stent  10/26/2015  . FOOT SURGERY  2013   BILATERAL   . HYSTEROSCOPY  2011   [place]yp  . PELVIC LAPAROSCOPY/ Hysteroscopy  1996    Family History  Problem Relation Age of Onset  . Breast cancer Maternal Aunt 29  . Diabetes Maternal Grandmother   . Heart disease Maternal Grandmother   . Heart disease Maternal Grandfather   . Hypertension Paternal Grandfather   . Heart Problems Paternal Grandfather   . Breast cancer Maternal Aunt        dx. early 1s; had negative GT approx 10 years ago  . Breast cancer Maternal Aunt        dx. 56s with recurrence  . Allergies Father   . Heart disease Mother   . Other Mother        hx of hysterectomy   . Liver cancer Cousin 18       +EtOH  . Cancer Cousin        paternal 1st cousin, once-removed dx. NOS cancer (maybe ovarian) in late 30s-40s  . Cancer Cousin        female paternal 2nd cousin d. of NOS cancer in her 20s-early 17s  . Ovarian cancer Neg Hx   . Colon cancer Neg Hx     Allergies  Allergen Reactions  . Doxil [Doxorubicin Hcl Liposomal] Anaphylaxis    1st Doxil.   Marland Kitchen Bee Pollen     Pollen and grass causes a lot sneezing  . Pollen Extract Other (See Comments)    Pollen and grass causes a lot sneezing    Current Outpatient Medications on File Prior to Visit  Medication Sig Dispense Refill  . acetaminophen (TYLENOL) 325 MG tablet Take 2 tablets (650 mg total) by mouth every 4 (four) hours as needed for mild pain, fever or headache. 30 tablet 0  .  Acetylcarnitine HCl (ACETYL L-CARNITINE PO) Take 2,000 mg by mouth at bedtime.    . ALPRAZolam (XANAX) 0.5 MG tablet Take 0.5 mg by mouth 2 (two) times daily as needed for anxiety.    Marland Kitchen aspirin 81 MG chewable tablet Chew 1 tablet (81 mg total) by mouth daily. 30 tablet 0  . atorvastatin (LIPITOR) 40 MG tablet Take 1 tablet (  40 mg total) by mouth daily. 90 tablet 2  . benzonatate (TESSALON) 100 MG capsule Take by mouth 2 (two) times daily as needed for cough. Take one capsule by mouth twice a  Day as needed for cough    . carvedilol (COREG) 3.125 MG tablet Take 1/2 tablet by mouth twice daily with meals. 30 tablet 3  . clotrimazole-betamethasone (LOTRISONE) cream Apply 1 application topically 2 (two) times daily. 30 g 1  . divalproex (DEPAKOTE) 250 MG DR tablet Take by mouth. Take 500mg s in the morning and 750mg s at night    . fluticasone (FLONASE) 50 MCG/ACT nasal spray INHALE 2 SPRAYS INTO EACH NOSTRIL EVERY NIGHT  11  . ipratropium (ATROVENT) 0.06 % nasal spray Place 2 sprays into both nostrils 4 (four) times daily. 15 mL 0  . lamoTRIgine (LAMICTAL) 100 MG tablet Take 100 mg by mouth 2 (two) times daily.    Marland Kitchen levothyroxine (SYNTHROID, LEVOTHROID) 100 MCG tablet Take 1 tablet (100 mcg total) by mouth daily. 90 tablet 1  . lidocaine-prilocaine (EMLA) cream Apply topically as needed. Apply to port with every chemotherapy. 30 g 2  . loperamide (IMODIUM A-D) 2 MG tablet Take 2 mg by mouth as needed for diarrhea or loose stools.    Marland Kitchen loratadine (CLARITIN) 10 MG tablet Take 10 mg by mouth daily as needed for allergies (with chemo).    . nitroGLYCERIN (NITROSTAT) 0.4 MG SL tablet Place 1 tablet (0.4 mg total) under the tongue every 5 (five) minutes as needed for chest pain. 25 tablet 3  . ondansetron (ZOFRAN ODT) 8 MG disintegrating tablet Take 1 tablet (8 mg total) by mouth every 8 (eight) hours as needed for nausea or vomiting. 20 tablet 1  . ondansetron (ZOFRAN) 8 MG tablet TAKE 1 TABLET BY MOUTH EVERY  8 HOURS AS NEEDED FOR NAUSEA AND VOMITING 30 tablet 2  . pantoprazole (PROTONIX) 40 MG tablet Take 1 tablet (40 mg total) by mouth daily. 90 tablet 0  . prochlorperazine (COMPAZINE) 10 MG tablet Take 1 tablet (10 mg total) by mouth every 6 (six) hours as needed for nausea or vomiting. 30 tablet 0  . rOPINIRole (REQUIP) 0.5 MG tablet Take 3 tablets (1.5 mg total) by mouth at bedtime. 90 tablet 4  . sertraline (ZOLOFT) 50 MG tablet Take by mouth daily.      Current Facility-Administered Medications on File Prior to Visit  Medication Dose Route Frequency Provider Last Rate Last Dose  . heparin lock flush 100 unit/mL  500 Units Intravenous Once Shadad, Mathis Dad, MD      . sodium chloride flush (NS) 0.9 % injection 10 mL  10 mL Intravenous PRN Shadad, Mathis Dad, MD        BP 110/80 (BP Location: Right Arm, Patient Position: Sitting, Cuff Size: Large)   Temp 99.2 F (37.3 C) (Oral)   Wt 140 lb (63.5 kg)   BMI 26.45 kg/m       Objective:   Physical Exam  Constitutional: She is oriented to person, place, and time. She appears well-developed and well-nourished. No distress.  HENT:  Head: Normocephalic and atraumatic.  Right Ear: External ear normal.  Left Ear: External ear normal.  Nose: Nose normal.  Mouth/Throat: Oropharynx is clear and moist.  Cardiovascular: Normal rate, regular rhythm, normal heart sounds and intact distal pulses.  Pulmonary/Chest: Effort normal. No stridor. She has no wheezes. She has no rhonchi. She has rales.     Abdominal: Soft. Bowel sounds are normal.  Neurological: She is alert and oriented to person, place, and time.  Skin: Skin is warm and dry. She is not diaphoretic.  Psychiatric: She has a normal mood and affect. Her behavior is normal. Judgment and thought content normal.  Nursing note and vitals reviewed.     Assessment & Plan:   1. Upper respiratory tract infection, unspecified type - ipratropium-albuterol (DUONEB) 0.5-2.5 (3) MG/3ML nebulizer  solution 3 mL - predniSONE (DELTASONE) 20 MG tablet; Take 0.5 tablets (10 mg total) by mouth daily with breakfast.  Dispense: 5 tablet; Refill: 0 - benzonatate (TESSALON) 200 MG capsule; Take 1 capsule (200 mg total) by mouth 2 (two) times daily as needed for cough.  Dispense: 20 capsule; Refill: 0 - Follow up if no improvement or if fevers develops   Patient endorses feeling improved after duo neb. Upon exam she was moving air much easier. Continues to have trace rales.    Dorothyann Peng, NP

## 2017-11-04 ENCOUNTER — Telehealth: Payer: Self-pay | Admitting: Oncology

## 2017-11-04 ENCOUNTER — Inpatient Hospital Stay: Payer: PPO | Admitting: Lab

## 2017-11-04 ENCOUNTER — Inpatient Hospital Stay: Payer: PPO

## 2017-11-04 ENCOUNTER — Other Ambulatory Visit: Payer: Self-pay | Admitting: *Deleted

## 2017-11-04 ENCOUNTER — Other Ambulatory Visit: Payer: Self-pay

## 2017-11-04 ENCOUNTER — Inpatient Hospital Stay (HOSPITAL_BASED_OUTPATIENT_CLINIC_OR_DEPARTMENT_OTHER): Payer: PPO | Admitting: Oncology

## 2017-11-04 VITALS — BP 132/68 | HR 61 | Temp 98.3°F | Resp 18 | Ht 61.0 in | Wt 139.9 lb

## 2017-11-04 DIAGNOSIS — J40 Bronchitis, not specified as acute or chronic: Secondary | ICD-10-CM | POA: Diagnosis not present

## 2017-11-04 DIAGNOSIS — D6959 Other secondary thrombocytopenia: Secondary | ICD-10-CM

## 2017-11-04 DIAGNOSIS — Z923 Personal history of irradiation: Secondary | ICD-10-CM

## 2017-11-04 DIAGNOSIS — C786 Secondary malignant neoplasm of retroperitoneum and peritoneum: Secondary | ICD-10-CM | POA: Diagnosis not present

## 2017-11-04 DIAGNOSIS — Z79899 Other long term (current) drug therapy: Secondary | ICD-10-CM | POA: Diagnosis not present

## 2017-11-04 DIAGNOSIS — Z9221 Personal history of antineoplastic chemotherapy: Secondary | ICD-10-CM | POA: Diagnosis not present

## 2017-11-04 DIAGNOSIS — Z5189 Encounter for other specified aftercare: Secondary | ICD-10-CM | POA: Diagnosis not present

## 2017-11-04 DIAGNOSIS — C569 Malignant neoplasm of unspecified ovary: Secondary | ICD-10-CM

## 2017-11-04 DIAGNOSIS — Z853 Personal history of malignant neoplasm of breast: Secondary | ICD-10-CM

## 2017-11-04 DIAGNOSIS — D701 Agranulocytosis secondary to cancer chemotherapy: Secondary | ICD-10-CM | POA: Diagnosis not present

## 2017-11-04 DIAGNOSIS — D649 Anemia, unspecified: Secondary | ICD-10-CM

## 2017-11-04 DIAGNOSIS — C482 Malignant neoplasm of peritoneum, unspecified: Secondary | ICD-10-CM

## 2017-11-04 DIAGNOSIS — Z7982 Long term (current) use of aspirin: Secondary | ICD-10-CM | POA: Diagnosis not present

## 2017-11-04 DIAGNOSIS — Z95828 Presence of other vascular implants and grafts: Secondary | ICD-10-CM

## 2017-11-04 LAB — CMP (CANCER CENTER ONLY)
ALT: <6 U/L (ref 0–44)
AST: 15 U/L (ref 15–41)
Albumin: 3.3 g/dL — ABNORMAL LOW (ref 3.5–5.0)
Alkaline Phosphatase: 88 U/L (ref 38–126)
Anion gap: 7 (ref 5–15)
BILIRUBIN TOTAL: 0.5 mg/dL (ref 0.3–1.2)
BUN: 10 mg/dL (ref 6–20)
CALCIUM: 9.3 mg/dL (ref 8.9–10.3)
CHLORIDE: 104 mmol/L (ref 98–111)
CO2: 27 mmol/L (ref 22–32)
CREATININE: 0.68 mg/dL (ref 0.44–1.00)
Glucose, Bld: 99 mg/dL (ref 70–99)
Potassium: 3.9 mmol/L (ref 3.5–5.1)
Sodium: 138 mmol/L (ref 135–145)
TOTAL PROTEIN: 8 g/dL (ref 6.5–8.1)

## 2017-11-04 LAB — CBC WITH DIFFERENTIAL (CANCER CENTER ONLY)
BASOS ABS: 0 10*3/uL (ref 0.0–0.1)
Basophils Relative: 0 %
EOS ABS: 0 10*3/uL (ref 0.0–0.5)
EOS PCT: 0 %
HCT: 26.6 % — ABNORMAL LOW (ref 34.8–46.6)
Hemoglobin: 8 g/dL — ABNORMAL LOW (ref 11.6–15.9)
Lymphocytes Relative: 33 %
Lymphs Abs: 1.5 10*3/uL (ref 0.9–3.3)
MCH: 30.7 pg (ref 25.1–34.0)
MCHC: 30.1 g/dL — ABNORMAL LOW (ref 31.5–36.0)
MCV: 101.9 fL — ABNORMAL HIGH (ref 79.5–101.0)
Monocytes Absolute: 0.4 10*3/uL (ref 0.1–0.9)
Monocytes Relative: 9 %
Neutro Abs: 2.6 10*3/uL (ref 1.5–6.5)
Neutrophils Relative %: 58 %
PLATELETS: 41 10*3/uL — AB (ref 145–400)
RBC: 2.61 MIL/uL — AB (ref 3.70–5.45)
RDW: 20.7 % — ABNORMAL HIGH (ref 11.2–14.5)
WBC: 4.5 10*3/uL (ref 3.9–10.3)

## 2017-11-04 LAB — PREPARE RBC (CROSSMATCH)

## 2017-11-04 LAB — TOTAL PROTEIN, URINE DIPSTICK: Protein, ur: <30 mg/dL — AB

## 2017-11-04 LAB — ABO/RH: ABO/RH(D): B POS

## 2017-11-04 MED ORDER — SODIUM CHLORIDE 0.9 % IJ SOLN
10.0000 mL | Freq: Once | INTRAMUSCULAR | Status: AC
  Administered 2017-11-04: 10 mL
  Filled 2017-11-04: qty 10

## 2017-11-04 MED ORDER — SODIUM CHLORIDE 0.9% FLUSH
10.0000 mL | INTRAVENOUS | Status: DC | PRN
  Filled 2017-11-04: qty 10

## 2017-11-04 MED ORDER — HEPARIN SOD (PORK) LOCK FLUSH 100 UNIT/ML IV SOLN
500.0000 [IU] | Freq: Once | INTRAVENOUS | Status: DC | PRN
  Filled 2017-11-04: qty 5

## 2017-11-04 MED ORDER — SODIUM CHLORIDE 0.9 % IV SOLN
1000.0000 mg | Freq: Once | INTRAVENOUS | Status: AC
  Administered 2017-11-04: 1000 mg via INTRAVENOUS
  Filled 2017-11-04: qty 32

## 2017-11-04 MED ORDER — SODIUM CHLORIDE 0.9 % IV SOLN
Freq: Once | INTRAVENOUS | Status: DC
  Filled 2017-11-04: qty 250

## 2017-11-04 MED ORDER — HEPARIN SOD (PORK) LOCK FLUSH 100 UNIT/ML IV SOLN
500.0000 [IU] | Freq: Once | INTRAVENOUS | Status: AC | PRN
  Administered 2017-11-04: 500 [IU]
  Filled 2017-11-04: qty 5

## 2017-11-04 MED ORDER — SODIUM CHLORIDE 0.9 % IV SOLN
1000.0000 mg/m2 | Freq: Once | INTRAVENOUS | Status: AC
  Administered 2017-11-04: 1634 mg via INTRAVENOUS
  Filled 2017-11-04: qty 42.98

## 2017-11-04 MED ORDER — PROCHLORPERAZINE MALEATE 10 MG PO TABS
ORAL_TABLET | ORAL | Status: AC
  Filled 2017-11-04: qty 1

## 2017-11-04 MED ORDER — SODIUM CHLORIDE 0.9% FLUSH
10.0000 mL | INTRAVENOUS | Status: DC | PRN
  Administered 2017-11-04: 10 mL
  Filled 2017-11-04: qty 10

## 2017-11-04 MED ORDER — SODIUM CHLORIDE 0.9 % IV SOLN
Freq: Once | INTRAVENOUS | Status: AC
  Administered 2017-11-04: 13:00:00 via INTRAVENOUS
  Filled 2017-11-04: qty 250

## 2017-11-04 MED ORDER — PROCHLORPERAZINE MALEATE 10 MG PO TABS
10.0000 mg | ORAL_TABLET | Freq: Once | ORAL | Status: AC
  Administered 2017-11-04: 10 mg via ORAL

## 2017-11-04 NOTE — Telephone Encounter (Signed)
Appts scheduled AVS/Calendar declined due to epic per 9/16 los

## 2017-11-04 NOTE — Progress Notes (Signed)
Per Dr. Alen Blew, ok to treat with plt 41

## 2017-11-04 NOTE — Patient Instructions (Signed)
[facility] Discharge Instructions for Patients Receiving Chemotherapy  Today you received the following chemotherapy agents gemzar.  To help prevent nausea and vomiting after your treatment, we encourage you to take your nausea medication as directed.   If you develop nausea and vomiting that is not controlled by your nausea medication, call the clinic.   BELOW ARE SYMPTOMS THAT SHOULD BE REPORTED IMMEDIATELY:  *FEVER GREATER THAN 100.5 F  *CHILLS WITH OR WITHOUT FEVER  NAUSEA AND VOMITING THAT IS NOT CONTROLLED WITH YOUR NAUSEA MEDICATION  *UNUSUAL SHORTNESS OF BREATH  *UNUSUAL BRUISING OR BLEEDING  TENDERNESS IN MOUTH AND THROAT WITH OR WITHOUT PRESENCE OF ULCERS  *URINARY PROBLEMS  *BOWEL PROBLEMS  UNUSUAL RASH Items with * indicate a potential emergency and should be followed up as soon as possible.  Feel free to call the clinic should you have any questions or concerns. The clinic phone number is [phone].  Please show the [facility] at check-in to the Emergency Department and triage nurse. Gemcitabine injection What is this medicine? GEMCITABINE (jem SIT a been) is a chemotherapy drug. This medicine is used to treat many types of cancer like breast cancer, lung cancer, pancreatic cancer, and ovarian cancer. This medicine may be used for other purposes; ask your health care provider or pharmacist if you have questions. COMMON BRAND NAME(S): Gemzar What should I tell my health care provider before I take this medicine? They need to know if you have any of these conditions: -blood disorders -infection -kidney disease -liver disease -recent or ongoing radiation therapy -an unusual or allergic reaction to gemcitabine, other chemotherapy, other medicines, foods, dyes, or preservatives -pregnant or trying to get pregnant -breast-feeding How should I use this medicine? This drug is given as an infusion into a vein. It is administered in a  hospital or clinic by a specially trained health care professional. Talk to your pediatrician regarding the use of this medicine in children. Special care may be needed. Overdosage: If you think you have taken too much of this medicine contact a poison control center or emergency room at once. NOTE: This medicine is only for you. Do not share this medicine with others. What if I miss a dose? It is important not to miss your dose. Call your doctor or health care professional if you are unable to keep an appointment. What may interact with this medicine? -medicines to increase blood counts like filgrastim, pegfilgrastim, sargramostim -some other chemotherapy drugs like cisplatin -vaccines Talk to your doctor or health care professional before taking any of these medicines: -acetaminophen -aspirin -ibuprofen -ketoprofen -naproxen This list may not describe all possible interactions. Give your health care provider a list of all the medicines, herbs, non-prescription drugs, or dietary supplements you use. Also tell them if you smoke, drink alcohol, or use illegal drugs. Some items may interact with your medicine. What should I watch for while using this medicine? Visit your doctor for checks on your progress. This drug may make you feel generally unwell. This is not uncommon, as chemotherapy can affect healthy cells as well as cancer cells. Report any side effects. Continue your course of treatment even though you feel ill unless your doctor tells you to stop. In some cases, you may be given additional medicines to help with side effects. Follow all directions for their use. Call your doctor or health care professional for advice if you get a fever, chills or sore throat, or other symptoms of a cold or flu.  Do not treat yourself. This drug decreases your body's ability to fight infections. Try to avoid being around people who are sick. This medicine may increase your risk to bruise or bleed. Call  your doctor or health care professional if you notice any unusual bleeding. Be careful brushing and flossing your teeth or using a toothpick because you may get an infection or bleed more easily. If you have any dental work done, tell your dentist you are receiving this medicine. Avoid taking products that contain aspirin, acetaminophen, ibuprofen, naproxen, or ketoprofen unless instructed by your doctor. These medicines may hide a fever. Women should inform their doctor if they wish to become pregnant or think they might be pregnant. There is a potential for serious side effects to an unborn child. Talk to your health care professional or pharmacist for more information. Do not breast-feed an infant while taking this medicine. What side effects may I notice from receiving this medicine? Side effects that you should report to your doctor or health care professional as soon as possible: -allergic reactions like skin rash, itching or hives, swelling of the face, lips, or tongue -low blood counts - this medicine may decrease the number of white blood cells, red blood cells and platelets. You may be at increased risk for infections and bleeding. -signs of infection - fever or chills, cough, sore throat, pain or difficulty passing urine -signs of decreased platelets or bleeding - bruising, pinpoint red spots on the skin, black, tarry stools, blood in the urine -signs of decreased red blood cells - unusually weak or tired, fainting spells, lightheadedness -breathing problems -chest pain -mouth sores -nausea and vomiting -pain, swelling, redness at site where injected -pain, tingling, numbness in the hands or feet -stomach pain -swelling of ankles, feet, hands -unusual bleeding Side effects that usually do not require medical attention (report to your doctor or health care professional if they continue or are bothersome): -constipation -diarrhea -hair loss -loss of appetite -stomach upset This  list may not describe all possible side effects. Call your doctor for medical advice about side effects. You may report side effects to FDA at 1-800-FDA-1088. Where should I keep my medicine? This drug is given in a hospital or clinic and will not be stored at home. NOTE: This sheet is a summary. It may not cover all possible information. If you have questions about this medicine, talk to your doctor, pharmacist, or health care provider.  2018 Elsevier/Gold Standard (2007-06-17 18:45:54)

## 2017-11-04 NOTE — Progress Notes (Signed)
Hematology and Oncology Follow Up Visit  [name] [id number] 22-Oct-1955 62 y.o. 11/04/2017 10:17 AM    Principle Diagnosis: 62 year old woman with:  1.  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    2.  Breast cancer diagnosed in 2017.  She was found to have left-sided ductal carcinoma and positive sentinel lymph node biopsy. The tumor is ER positive PR negative, HER-2 negative.  Prior Therapy:  Carboplatin and Taxotere started on 10/01/2012. Avastin was added with cycle 4. Chemotherapy only discontinued in June of 2015.  She is S/P Avastin maintenance only between June 2015 till March 2017. Therapy discontinued due to progression of disease.  She is status post Doxil salvage chemotherapy therapy discontinued because of hypersensitivity reaction in March 2017.  Carboplatin and AUC of 5 started on 07/08/2015.  Therapy discontinued in August 2019 after progression of disease.   Current therapy: Under consideration to start gemcitabine and a Avastin.  Gemcitabine will be given on day 1 and day 8 of a 21-day cycle, a Avastin will be given once every 3 weeks.  Interim History: [name] presents today for a follow-up.  Since last visit, she was evaluated by Dr. Aldean Ast regarding her recent progression on carboplatin chemotherapy.  He recommended proceeding with different salvage therapy utilizing gemcitabine and a Avastin.  She also developed upper respiratory tract infection and completed a course of antibiotics.  She denies any shortness of breath at this time but does report a nonproductive cough.  She does report some fatigue and tiredness and mild dyspnea on exertion.  She denies any active bleeding including no epistaxis, hematochezia or melena.  Her appetite remained reasonable and performance status is unchanged.  She does not report any headaches, blurry vision, or seizures.  She denies any confusion or  alteration in mental status.  She does not report any chest pain, palpitation, orthopnea or leg edema.  Has not reported any shortness of breath or cough. She does not report any nausea, vomiting or change in her bowel habits.  She does not report any hematuria or dysuria.  She did report vaginal bleeding after her recent exam but have subsided at this time.  She does not report any arthralgias or myalgias.  She does not report any bleeding or clotting tendencies.  She denied any anxiety or depression.  She denies any easy bruising.  Remainder of the review of systems is negative.   Medications: I have reviewed the patient's current medications.  Current Outpatient Medications  Medication Sig Dispense Refill  . acetaminophen (TYLENOL) 325 MG tablet Take 2 tablets (650 mg total) by mouth every 4 (four) hours as needed for mild pain, fever or headache. 30 tablet 0  . Acetylcarnitine HCl (ACETYL L-CARNITINE PO) Take 2,000 mg by mouth at bedtime.    . ALPRAZolam (XANAX) 0.5 MG tablet Take 0.5 mg by mouth 2 (two) times daily as needed for anxiety.    Marland Kitchen aspirin 81 MG chewable tablet Chew 1 tablet (81 mg total) by mouth daily. 30 tablet 0  . atorvastatin (LIPITOR) 40 MG tablet Take 1 tablet (40 mg total) by mouth daily. 90 tablet 2  . benzonatate (TESSALON) 200 MG capsule Take 1 capsule (200 mg total) by mouth 2 (two) times daily as needed for cough. 20 capsule 0  . carvedilol (COREG) 3.125 MG tablet Take 1/2 tablet by mouth twice daily with meals. 30 tablet 3  . clotrimazole-betamethasone (LOTRISONE) cream Apply 1 application topically 2 (two)  times daily. 30 g 1  . divalproex (DEPAKOTE) 250 MG DR tablet Take by mouth. Take 564ms in the morning and 7542m at night    . fluticasone (FLONASE) 50 MCG/ACT nasal spray INHALE 2 SPRAYS INTO EACH NOSTRIL EVERY NIGHT  11  . ipratropium (ATROVENT) 0.06 % nasal spray Place 2 sprays into both nostrils 4 (four) times daily. 15 mL 0  . lamoTRIgine (LAMICTAL) 100 MG  tablet Take 100 mg by mouth 2 (two) times daily.    . Marland Kitchenevothyroxine (SYNTHROID, LEVOTHROID) 100 MCG tablet Take 1 tablet (100 mcg total) by mouth daily. 90 tablet 1  . lidocaine-prilocaine (EMLA) cream Apply topically as needed. Apply to port with every chemotherapy. 30 g 2  . loperamide (IMODIUM A-D) 2 MG tablet Take 2 mg by mouth as needed for diarrhea or loose stools.    . Marland Kitchenoratadine (CLARITIN) 10 MG tablet Take 10 mg by mouth daily as needed for allergies (with chemo).    . nitroGLYCERIN (NITROSTAT) 0.4 MG SL tablet Place 1 tablet (0.4 mg total) under the tongue every 5 (five) minutes as needed for chest pain. 25 tablet 3  . ondansetron (ZOFRAN ODT) 8 MG disintegrating tablet Take 1 tablet (8 mg total) by mouth every 8 (eight) hours as needed for nausea or vomiting. 20 tablet 1  . ondansetron (ZOFRAN) 8 MG tablet TAKE 1 TABLET BY MOUTH EVERY 8 HOURS AS NEEDED FOR NAUSEA AND VOMITING 30 tablet 2  . pantoprazole (PROTONIX) 40 MG tablet Take 1 tablet (40 mg total) by mouth daily. 90 tablet 0  . predniSONE (DELTASONE) 20 MG tablet Take 0.5 tablets (10 mg total) by mouth daily with breakfast. 5 tablet 0  . prochlorperazine (COMPAZINE) 10 MG tablet Take 1 tablet (10 mg total) by mouth every 6 (six) hours as needed for nausea or vomiting. 30 tablet 0  . rOPINIRole (REQUIP) 0.5 MG tablet Take 3 tablets (1.5 mg total) by mouth at bedtime. 90 tablet 4  . sertraline (ZOLOFT) 50 MG tablet Take by mouth daily.      No current facility-administered medications for this visit.    Facility-Administered Medications Ordered in Other Visits  Medication Dose Route Frequency Provider Last Rate Last Dose  . heparin lock flush 100 unit/mL  500 Units Intravenous Once ShWyatt PortelaMD      . sodium chloride flush (NS) 0.9 % injection 10 mL  10 mL Intravenous PRN ShWyatt PortelaMD         Allergies:  Allergies  Allergen Reactions  . Doxil [Doxorubicin Hcl Liposomal] Anaphylaxis    1st Doxil.   . Marland [place]  Pollen     Pollen and grass causes a lot sneezing  . Pollen Extract Other (See Comments)    Pollen and grass causes a lot sneezing    Past Medical History, Surgical history, Social history, and Family History reviewed and unchanged.    Physical Exam:  Blood pressure 132/68, pulse 61, temperature 98.3 F (36.8 C), temperature source Oral, resp. rate 18, height _0  (1.549 m), weight 139 lb 14.4 oz (63.5 kg), SpO2 100 %.    General appearance: Alert, awake without any distress. Head: Atraumatic without abnormalities Oropharynx: Without any thrush or ulcers. Eyes: No scleral icterus. Lymph nodes: No lymphadenopathy noted in the cervical, supraclavicular, or axillary nodes Heart:regular rate and rhythm, without any murmurs or gallops.   Lung: Clear to auscultation without any rhonchi, wheezes or dullness to percussion. Abdomin: Soft, nontender without any shifting dullness or ascites.  Musculoskeletal: No clubbing or cyanosis. Neurological: No motor or sensory deficits. Skin: No rashes or lesions. Psychiatric: Mood and affect appeared normal.       Lab Results: Lab Results  Component Value Date   WBC 3.3 (L) 10/14/2017   HGB 8.5 (L) 10/14/2017   HCT 26.9 (L) 10/14/2017   MCV 99.0 10/14/2017   PLT 56 (L) 10/14/2017     Chemistry      Component Value Date/Time   NA 144 10/14/2017 0836   NA 142 01/21/2017 0822   K 3.9 10/14/2017 0836   K 4.1 01/21/2017 0822   CL 110 10/14/2017 0836   CO2 27 10/14/2017 0836   CO2 27 01/21/2017 0822   BUN 6 10/14/2017 0836   BUN 12.2 01/21/2017 0822   CREATININE 0.66 10/14/2017 0836   CREATININE 0.7 01/21/2017 0822      Component Value Date/Time   CALCIUM 8.9 10/14/2017 0836   CALCIUM 8.9 01/21/2017 0822   ALKPHOS 99 10/14/2017 0836   ALKPHOS 75 01/21/2017 0822   AST 14 (L) 10/14/2017 0836   AST 12 01/21/2017 0822   ALT <6 10/14/2017 0836   ALT 7 01/21/2017 0822   BILITOT 0.2 (L) 10/14/2017 0836   BILITOT 0.23 01/21/2017  0822       IMPRESSION: 1. Interval progression of upper normal mediastinal lymphadenopathy. Evolving metastatic disease a concern and close attention on follow-up recommended. 2. Progressive retroperitoneal and right pelvic sidewall lymphadenopathy, most notably in the right common and external iliac chains. 3. The omental disease is similar to prior. 4. Trace fluid adjacent to the liver. 5.  Aortic Atherosclerois (ICD10-170.0)   Results for CHELAN, HERINGER (MRN [id number]) as of 10/14/2017 09:26  Ref. Range 07/23/2017 09:30 08/12/2017 09:17 09/23/2017 09:15  Cancer Antigen (CA) 125 Latest Ref Range: 0.0 - 38.1 U/mL 24.9 24.6 27.5     Impression and Plan:  62 year old woman with:  1.  Metastatic adenocarcinoma with peritoneal carcinomatosis diagnosed in 2014.  Primary arising from an ovarian cancer likely.  Her CT scan in August 2019 was reviewed again as well as the natural course of this disease.  Treatment options were reviewed given her recent progression on carboplatin.  Based on Dr. Primitivo Gauze recommendation, will proceed with gemcitabine and a Avastin.  Complication associated with this therapy was reviewed today which include worsening thrombocytopenia, bleeding, neutropenia, neutropenic sepsis and thrombosis associated with a Avastin.  Hypertension and proteinuria were also complications related to this medication.  After discussion today, she is willing to proceed and will receive gemcitabine 2 weeks in a row in a week off.  I feel that this is better schedule given her counts.  It is likely that she will require gemcitabine once every 3 weeks given her thrombocytopenia.  2. IV access: Port-A-Cath continues to be accessed without any issues.  3. Thrombocytopenia: No bleeding reported at this time.  Continue to proceed with chemotherapy unless her platelet counts drop below 30,000.  4. Left breast cancer: Remains stable at this time with adequate chemotherapy controlling  her disease.   5. Neutropenia: Neutrophil is adequate to proceed with chemotherapy today.  6.  Anemia: Her hemoglobin has decreased and she is symptomatic.  Risks and benefits of packed red cell transfusion was reviewed today she is agreeable to proceed.  7.  Bronchitis: Appears to be resolving at this time.  8. Followup: She will follow-up in in 1 week for gemcitabine and in 2 weeks to start cycle 2 of gemcitabine and a Avastin.  25  minutes was spent with the patient face-to-face today.  More than 50% of time was dedicated to discussing the natural course of her disease, reviewing treatment options and coordinating her plan of care.   Zola Button, MD 9/16/201910:17 AM

## 2017-11-05 ENCOUNTER — Ambulatory Visit: Payer: PPO

## 2017-11-05 ENCOUNTER — Inpatient Hospital Stay: Payer: PPO

## 2017-11-05 DIAGNOSIS — Z5189 Encounter for other specified aftercare: Secondary | ICD-10-CM | POA: Diagnosis not present

## 2017-11-05 DIAGNOSIS — D649 Anemia, unspecified: Secondary | ICD-10-CM

## 2017-11-05 LAB — CA 125: Cancer Antigen (CA) 125: 33.2 U/mL (ref 0.0–38.1)

## 2017-11-05 MED ORDER — ACETAMINOPHEN 325 MG PO TABS
650.0000 mg | ORAL_TABLET | Freq: Once | ORAL | Status: AC
  Administered 2017-11-05: 650 mg via ORAL

## 2017-11-05 MED ORDER — SODIUM CHLORIDE 0.9% FLUSH
10.0000 mL | INTRAVENOUS | Status: AC | PRN
  Administered 2017-11-05: 10 mL
  Filled 2017-11-05: qty 10

## 2017-11-05 MED ORDER — ACETAMINOPHEN 325 MG PO TABS
ORAL_TABLET | ORAL | Status: AC
  Filled 2017-11-05: qty 2

## 2017-11-05 MED ORDER — DIPHENHYDRAMINE HCL 25 MG PO CAPS
ORAL_CAPSULE | ORAL | Status: AC
  Filled 2017-11-05: qty 1

## 2017-11-05 MED ORDER — DIPHENHYDRAMINE HCL 25 MG PO CAPS
25.0000 mg | ORAL_CAPSULE | Freq: Once | ORAL | Status: AC
  Administered 2017-11-05: 25 mg via ORAL

## 2017-11-05 MED ORDER — SODIUM CHLORIDE 0.9% IV SOLUTION
250.0000 mL | Freq: Once | INTRAVENOUS | Status: AC
  Administered 2017-11-05: 250 mL via INTRAVENOUS
  Filled 2017-11-05: qty 250

## 2017-11-05 MED ORDER — HEPARIN SOD (PORK) LOCK FLUSH 100 UNIT/ML IV SOLN
500.0000 [IU] | Freq: Every day | INTRAVENOUS | Status: AC | PRN
  Administered 2017-11-05: 500 [IU]
  Filled 2017-11-05: qty 5

## 2017-11-05 NOTE — Patient Instructions (Addendum)
Blood Transfusion, Care After °This sheet gives you information about how to care for yourself after your procedure. Your doctor may also give you more specific instructions. If you have problems or questions, contact your doctor. °Follow these instructions at home: °· Take over-the-counter and prescription medicines only as told by your doctor. °· Go back to your normal activities as told by your doctor. °· Follow instructions from your doctor about how to take care of the area where an IV tube was put into your vein (insertion site). Make sure you: °? Wash your hands with soap and water before you change your bandage (dressing). If there is no soap and water, use hand sanitizer. °? Change your bandage as told by your doctor. °· Check your IV insertion site every day for signs of infection. Check for: °? More redness, swelling, or pain. °? More fluid or blood. °? Warmth. °? Pus or a bad smell. °Contact a doctor if: °· You have more redness, swelling, or pain around the IV insertion site.. °· You have more fluid or blood coming from the IV insertion site. °· Your IV insertion site feels warm to the touch. °· You have pus or a bad smell coming from the IV insertion site. °· Your pee (urine) turns pink, red, or brown. °· You feel weak after doing your normal activities. °Get help right away if: °· You have signs of a serious allergic or body defense (immune) system reaction, including: °? Itchiness. °? Hives. °? Trouble breathing. °? Anxiety. °? Pain in your chest or lower back. °? Fever, flushing, and chills. °? Fast pulse. °? Rash. °? Watery poop (diarrhea). °? Throwing up (vomiting). °? Dark pee. °? Serious headache. °? Dizziness. °? Stiff neck. °? Yellow color in your face or the white parts of your eyes (jaundice). °Summary °· After a blood transfusion, return to your normal activities as told by your doctor. °· Every day, check for signs of infection where the IV tube was put into your vein. °· Some signs of  infection are warm skin, more redness and pain, more fluid or blood, and pus or a bad smell where the needle went in. °· Contact your doctor if you feel weak or have any unusual symptoms. °This information is not intended to replace advice given to you by your health care provider. Make sure you discuss any questions you have with your health care provider. °Document Released: 02/26/2014 Document Revised: 09/30/2015 Document Reviewed: 09/30/2015 °Elsevier Interactive Patient Education © 2017 [employer] ° ° °Blood Transfusion, Adult °A blood transfusion is a procedure in which you receive donated blood, including plasma, platelets, and red blood cells, through an IV tube. You may need a blood transfusion because of illness, surgery, or injury. The blood may come from a donor. You may also be able to donate blood for yourself (autologous blood donation) before a surgery if you know that you might require a blood transfusion. °The blood given in a transfusion is made up of different types of cells. You may receive: °· Red blood cells. These carry oxygen to the cells in the body. °· White blood cells. These help you fight infections. °· Platelets. These help your blood to clot. °· Plasma. This is the liquid part of your blood and it helps with fluid imbalances. ° °If you have hemophilia or another clotting disorder, you may also receive other types of blood products. °Tell a health care provider about: °· Any allergies you have. °· All medicines you   are taking, including vitamins, herbs, eye drops, creams, and over-the-counter medicines. °· Any problems you or family members have had with anesthetic medicines. °· Any blood disorders you have. °· Any surgeries you have had. °· Any medical conditions you have, including any recent fever or cold symptoms. °· Whether you are pregnant or may be pregnant. °· Any previous reactions you have had during a blood transfusion. °What are the risks? °Generally, this is a safe  procedure. However, problems may occur, including: °· Having an allergic reaction to something in the donated blood. Hives and itching may be symptoms of this type of reaction. °· Fever. This may be a reaction to the white blood cells in the transfused blood. Nausea or chest pain may accompany a fever. °· Iron overload. This can happen from having many transfusions. °· Transfusion-related acute lung injury (TRALI). This is a rare reaction that causes lung damage. The cause is not known. TRALI can occur within hours of a transfusion or several days later. °· Sudden (acute) or delayed hemolytic reactions. This happens if your blood does not match the cells in your transfusion. Your body’s defense system (immune system) may try to attack the new cells. This complication is rare. The symptoms include fever, chills, nausea, and low back pain or chest pain. °· Infection or disease transmission. This is rare. ° °What happens before the procedure? °· You will have a blood test to determine your blood type. This is necessary to know what kind of blood your body will accept and to match it to the donor blood. °· If you are going to have a planned surgery, you may be able to do an autologous blood donation. This may be done in case you need to have a transfusion. °· If you have had an allergic reaction to a transfusion in the past, you may be given medicine to help prevent a reaction. This medicine may be given to you by mouth or through an IV tube. °· You will have your temperature, blood pressure, and pulse monitored before the transfusion. °· Follow instructions from your health care provider about eating and drinking restrictions. °· Ask your health care provider about: °? Changing or stopping your regular medicines. This is especially important if you are taking diabetes medicines or blood thinners. °? Taking medicines such as aspirin and ibuprofen. These medicines can thin your blood. Do not take these medicines before  your procedure if your health care provider instructs you not to. °What happens during the procedure? °· An IV tube will be inserted into one of your veins. °· The bag of donated blood will be attached to your IV tube. The blood will then enter through your vein. °· Your temperature, blood pressure, and pulse will be monitored regularly during the transfusion. This monitoring is done to detect early signs of a transfusion reaction. °· If you have any signs or symptoms of a reaction, your transfusion will be stopped and you may be given medicine. °· When the transfusion is complete, your IV tube will be removed. °· Pressure may be applied to the IV site for a few minutes. °· A bandage (dressing) will be applied. °The procedure may vary among health care providers and hospitals. °What happens after the procedure? °· Your temperature, blood pressure, heart rate, breathing rate, and blood oxygen level will be monitored often. °· Your blood may be tested to see how you are responding to the transfusion. °· You may be warmed with fluids or blankets to maintain   a normal body temperature. °Summary °· A blood transfusion is a procedure in which you receive donated blood, including plasma, platelets, and red blood cells, through an IV tube. °· Your temperature, blood pressure, and pulse will be monitored before, during, and after the transfusion. °· Your blood may be tested after the transfusion to see how your body has responded. °This information is not intended to replace advice given to you by your health care provider. Make sure you discuss any questions you have with your health care provider. °Document Released: 02/03/2000 Document Revised: 11/03/2015 Document Reviewed: 11/03/2015 °Elsevier Interactive Patient Education © 2018 [employer] Inc. ° °

## 2017-11-06 LAB — TYPE AND SCREEN
ABO/RH(D): B POS
Antibody Screen: NEGATIVE
UNIT DIVISION: 0
Unit division: 0

## 2017-11-06 LAB — BPAM RBC
BLOOD PRODUCT EXPIRATION DATE: 201909282359
BLOOD PRODUCT EXPIRATION DATE: 201910052359
ISSUE DATE / TIME: 201909171226
ISSUE DATE / TIME: 201909171433
UNIT TYPE AND RH: 7300
UNIT TYPE AND RH: 7300

## 2017-11-06 NOTE — Telephone Encounter (Signed)
Ok to take both 

## 2017-11-08 DIAGNOSIS — F3131 Bipolar disorder, current episode depressed, mild: Secondary | ICD-10-CM | POA: Diagnosis not present

## 2017-11-11 ENCOUNTER — Inpatient Hospital Stay: Payer: PPO

## 2017-11-11 VITALS — BP 117/59 | HR 59 | Temp 98.3°F | Resp 16

## 2017-11-11 DIAGNOSIS — Z5189 Encounter for other specified aftercare: Secondary | ICD-10-CM | POA: Diagnosis not present

## 2017-11-11 DIAGNOSIS — C482 Malignant neoplasm of peritoneum, unspecified: Secondary | ICD-10-CM

## 2017-11-11 DIAGNOSIS — C569 Malignant neoplasm of unspecified ovary: Secondary | ICD-10-CM

## 2017-11-11 DIAGNOSIS — Z95828 Presence of other vascular implants and grafts: Secondary | ICD-10-CM

## 2017-11-11 DIAGNOSIS — C50919 Malignant neoplasm of unspecified site of unspecified female breast: Secondary | ICD-10-CM

## 2017-11-11 LAB — CBC WITH DIFFERENTIAL (CANCER CENTER ONLY)
Basophils Absolute: 0 10*3/uL (ref 0.0–0.1)
Basophils Relative: 0 %
Eosinophils Absolute: 0 10*3/uL (ref 0.0–0.5)
Eosinophils Relative: 1 %
HEMATOCRIT: 33 % — AB (ref 34.8–46.6)
HEMOGLOBIN: 10.1 g/dL — AB (ref 11.6–15.9)
LYMPHS PCT: 63 %
Lymphs Abs: 1 10*3/uL (ref 0.9–3.3)
MCH: 30.2 pg (ref 25.1–34.0)
MCHC: 30.6 g/dL — ABNORMAL LOW (ref 31.5–36.0)
MCV: 98.8 fL (ref 79.5–101.0)
Monocytes Absolute: 0.1 10*3/uL (ref 0.1–0.9)
Monocytes Relative: 6 %
NEUTROS ABS: 0.5 10*3/uL — AB (ref 1.5–6.5)
NEUTROS PCT: 30 %
PLATELETS: 58 10*3/uL — AB (ref 145–400)
RBC: 3.34 MIL/uL — AB (ref 3.70–5.45)
RDW: 18 % — ABNORMAL HIGH (ref 11.2–14.5)
WBC: 1.6 10*3/uL — AB (ref 3.9–10.3)

## 2017-11-11 LAB — CMP (CANCER CENTER ONLY)
ALT: 21 U/L (ref 0–44)
AST: 42 U/L — AB (ref 15–41)
Albumin: 3 g/dL — ABNORMAL LOW (ref 3.5–5.0)
Alkaline Phosphatase: 90 U/L (ref 38–126)
Anion gap: 8 (ref 5–15)
BUN: 13 mg/dL (ref 6–20)
CHLORIDE: 108 mmol/L (ref 98–111)
CO2: 26 mmol/L (ref 22–32)
Calcium: 8.9 mg/dL (ref 8.9–10.3)
Creatinine: 0.74 mg/dL (ref 0.44–1.00)
Glucose, Bld: 117 mg/dL — ABNORMAL HIGH (ref 70–99)
Potassium: 4 mmol/L (ref 3.5–5.1)
Sodium: 142 mmol/L (ref 135–145)
Total Bilirubin: 0.3 mg/dL (ref 0.3–1.2)
Total Protein: 8 g/dL (ref 6.5–8.1)

## 2017-11-11 MED ORDER — HEPARIN SOD (PORK) LOCK FLUSH 100 UNIT/ML IV SOLN
500.0000 [IU] | Freq: Once | INTRAVENOUS | Status: AC | PRN
  Administered 2017-11-11: 500 [IU]
  Filled 2017-11-11: qty 5

## 2017-11-11 MED ORDER — SODIUM CHLORIDE 0.9% FLUSH
10.0000 mL | INTRAVENOUS | Status: DC | PRN
  Administered 2017-11-11: 10 mL
  Filled 2017-11-11: qty 10

## 2017-11-11 MED ORDER — HEPARIN SOD (PORK) LOCK FLUSH 100 UNIT/ML IV SOLN
500.0000 [IU] | Freq: Once | INTRAVENOUS | Status: DC | PRN
  Filled 2017-11-11: qty 5

## 2017-11-11 MED ORDER — SODIUM CHLORIDE 0.9 % IJ SOLN
10.0000 mL | INTRAMUSCULAR | Status: DC | PRN
  Administered 2017-11-11: 10 mL via INTRAVENOUS
  Filled 2017-11-11: qty 10

## 2017-11-11 MED ORDER — SODIUM CHLORIDE 0.9 % IV SOLN
1000.0000 mg/m2 | Freq: Once | INTRAVENOUS | Status: AC
  Administered 2017-11-11: 1634 mg via INTRAVENOUS
  Filled 2017-11-11: qty 42.98

## 2017-11-11 MED ORDER — SODIUM CHLORIDE 0.9 % IV SOLN
Freq: Once | INTRAVENOUS | Status: AC
  Administered 2017-11-11: 12:00:00 via INTRAVENOUS
  Filled 2017-11-11: qty 250

## 2017-11-11 MED ORDER — PROCHLORPERAZINE MALEATE 10 MG PO TABS
ORAL_TABLET | ORAL | Status: AC
  Filled 2017-11-11: qty 1

## 2017-11-11 MED ORDER — PROCHLORPERAZINE MALEATE 10 MG PO TABS
10.0000 mg | ORAL_TABLET | Freq: Once | ORAL | Status: AC
  Administered 2017-11-11: 10 mg via ORAL

## 2017-11-11 NOTE — Progress Notes (Signed)
Per Dr. Alen Blew, [place] to tx with ANC 0.5 and platelets 58. Neulasta tomorrow.

## 2017-11-12 ENCOUNTER — Inpatient Hospital Stay: Payer: PPO

## 2017-11-12 ENCOUNTER — Other Ambulatory Visit: Payer: Self-pay | Admitting: Oncology

## 2017-11-12 VITALS — BP 106/56 | HR 63 | Temp 98.4°F | Resp 18

## 2017-11-12 DIAGNOSIS — C50919 Malignant neoplasm of unspecified site of unspecified female breast: Secondary | ICD-10-CM

## 2017-11-12 DIAGNOSIS — Z5189 Encounter for other specified aftercare: Secondary | ICD-10-CM | POA: Diagnosis not present

## 2017-11-12 MED ORDER — PEGFILGRASTIM INJECTION 6 MG/0.6ML ~~LOC~~
PREFILLED_SYRINGE | SUBCUTANEOUS | Status: AC
  Filled 2017-11-12: qty 0.6

## 2017-11-12 MED ORDER — PEGFILGRASTIM INJECTION 6 MG/0.6ML ~~LOC~~
6.0000 mg | PREFILLED_SYRINGE | Freq: Once | SUBCUTANEOUS | Status: AC
  Administered 2017-11-12: 6 mg via SUBCUTANEOUS

## 2017-11-12 NOTE — Patient Instructions (Signed)
Pegfilgrastim injection What is this medicine? PEGFILGRASTIM (PEG fil gra stim) is a long-acting granulocyte colony-stimulating factor that stimulates the growth of neutrophils, a type of white blood cell important in the body's fight against infection. It is used to reduce the incidence of fever and infection in patients with certain types of cancer who are receiving chemotherapy that affects the bone marrow, and to increase survival after being exposed to high doses of radiation. This medicine may be used for other purposes; ask your health care provider or pharmacist if you have questions. COMMON BRAND NAME(S): Neulasta What should I tell my health care provider before I take this medicine? They need to know if you have any of these conditions: -kidney disease -latex allergy -ongoing radiation therapy -sickle cell disease -skin reactions to acrylic adhesives (On-Body Injector only) -an unusual or allergic reaction to pegfilgrastim, filgrastim, other medicines, foods, dyes, or preservatives -pregnant or trying to get pregnant -breast-feeding How should I use this medicine? This medicine is for injection under the skin. If you get this medicine at home, you will be taught how to prepare and give the pre-filled syringe or how to use the On-body Injector. Refer to the patient Instructions for Use for detailed instructions. Use exactly as directed. Tell your healthcare provider immediately if you suspect that the On-body Injector may not have performed as intended or if you suspect the use of the On-body Injector resulted in a missed or partial dose. It is important that you put your used needles and syringes in a special sharps container. Do not put them in a trash can. If you do not have a sharps container, call your pharmacist or healthcare provider to get one. Talk to your pediatrician regarding the use of this medicine in children. While this drug may be prescribed for selected conditions,  precautions do apply. Overdosage: If you think you have taken too much of this medicine contact a poison control center or emergency room at once. NOTE: This medicine is only for you. Do not share this medicine with others. What if I miss a dose? It is important not to miss your dose. Call your doctor or health care professional if you miss your dose. If you miss a dose due to an On-body Injector failure or leakage, a new dose should be administered as soon as possible using a single prefilled syringe for manual use. What may interact with this medicine? Interactions have not been studied. Give your health care provider a list of all the medicines, herbs, non-prescription drugs, or dietary supplements you use. Also tell them if you smoke, drink alcohol, or use illegal drugs. Some items may interact with your medicine. This list may not describe all possible interactions. Give your health care provider a list of all the medicines, herbs, non-prescription drugs, or dietary supplements you use. Also tell them if you smoke, drink alcohol, or use illegal drugs. Some items may interact with your medicine. What should I watch for while using this medicine? You may need blood work done while you are taking this medicine. If you are going to need a MRI, CT scan, or other procedure, tell your doctor that you are using this medicine (On-Body Injector only). What side effects may I notice from receiving this medicine? Side effects that you should report to your doctor or health care professional as soon as possible: -allergic reactions like skin rash, itching or hives, swelling of the face, lips, or tongue -dizziness -fever -pain, redness, or irritation at site   where injected -pinpoint red spots on the skin -red or dark-brown urine -shortness of breath or breathing problems -stomach or side pain, or pain at the shoulder -swelling -tiredness -trouble passing urine or change in the amount of urine Side  effects that usually do not require medical attention (report to your doctor or health care professional if they continue or are bothersome): -bone pain -muscle pain This list may not describe all possible side effects. Call your doctor for medical advice about side effects. You may report side effects to FDA at 1-800-FDA-1088. Where should I keep my medicine? Keep out of the reach of children. Store pre-filled syringes in a refrigerator between 2 and 8 degrees C (36 and 46 degrees F). Do not freeze. Keep in carton to protect from light. Throw away this medicine if it is left out of the refrigerator for more than 48 hours. Throw away any unused medicine after the expiration date. NOTE: This sheet is a summary. It may not cover all possible information. If you have questions about this medicine, talk to your doctor, pharmacist, or health care provider.  2018 Elsevier/Gold Standard (2016-02-02 12:58:03)  

## 2017-11-14 ENCOUNTER — Ambulatory Visit (INDEPENDENT_AMBULATORY_CARE_PROVIDER_SITE_OTHER): Payer: PPO | Admitting: Family Medicine

## 2017-11-14 ENCOUNTER — Encounter: Payer: Self-pay | Admitting: Family Medicine

## 2017-11-14 VITALS — BP 102/68 | HR 78 | Temp 98.4°F | Wt 132.0 lb

## 2017-11-14 DIAGNOSIS — G2581 Restless legs syndrome: Secondary | ICD-10-CM

## 2017-11-14 DIAGNOSIS — Z79899 Other long term (current) drug therapy: Secondary | ICD-10-CM

## 2017-11-14 DIAGNOSIS — Z853 Personal history of malignant neoplasm of breast: Secondary | ICD-10-CM | POA: Diagnosis not present

## 2017-11-14 NOTE — Progress Notes (Signed)
Subjective:    Patient ID: [name], female    DOB: September 17, 1955, 62 y.o.   MRN: [id number]  No chief complaint on file.   HPI   Patient was seen today for f/u and TOC, previously seen by Dr. [name].  Pt has a list of concerns.   Pt undergoing chemo for breast cancer.  Pt states a nurse at chemo made a comment about the skin of her R chest at biopsy site appearing abnormal.  Pt notes the area does not itch and is not painful.  Pt had not noticed a change in the area.  Pt notes recent change in her chemo regimen, unsure if it is causing increased sweating.  Pt would like to know if she can stop taking so many meds.  Pt would like to reduce/stop her anti-depressant.  Pt is followed by psychiatry.  Pt currently taking lithium however, does not recall having level checked.  Pt notes h/o restless leg syndrome.  Taking requip.  Past Medical History:  Diagnosis Date  . ADD (attention deficit disorder)   . Allergy    seasonal  . Anxiety   . Bipolar disorder ([place])   . Breast cancer ([place]) 1989   right breast  . Bronchitis 10/26/2017  . Cancer ([place])    Omentum  . Coronary artery disease   . Depression   . Hematochezia   . History of echocardiogram    a. Limited Echo 9/17: EF 60-65%, no RWMA, Gr 1 DD, trivial AI, trivial MR, GLS -12% (likely underestimated), no pericardial effusion  . Hyperlipidemia   . Hypothyroidism   . Maintenance chemotherapy    Pt has chemo every 3 weeks (on Friday)  . NSTEMI (non-ST elevated myocardial infarction) ([place]) 10/26/2015  . Osteopenia 03/2009   t score -2.1 FRAX 4.6/0.4  . Personal history of chemotherapy   . Personal history of radiation therapy   . Premature menopause   . Sleep apnea    mild  . Urinary incontinence     Allergies  Allergen Reactions  . Doxil [Doxorubicin Hcl Liposomal] Anaphylaxis    1st Doxil.   Marland Kitchen Bee Pollen     Pollen and grass causes a lot sneezing  . Pollen Extract Other (See Comments)    Pollen and grass causes a lot  sneezing    ROS General: Denies fever, chills, night sweats, changes in weight, changes in appetite HEENT: Denies headaches, ear pain, changes in vision, rhinorrhea, sore throat CV: Denies CP, palpitations, SOB, orthopnea Pulm: Denies SOB, cough, wheezing GI: Denies abdominal pain, nausea, vomiting, diarrhea, constipation GU: Denies dysuria, hematuria, frequency, vaginal discharge  + currently treated being treated for breast cancer. Msk: Denies muscle cramps, joint pains  +RLS Neuro: Denies weakness, numbness, tingling Skin: Denies rashes, bruising Psych: Denies hallucinations  +h/o bipolar d/o, anxiety, depression, and ADD     Objective:    Blood pressure 102/68, pulse 78, temperature 98.4 F (36.9 C), temperature source Oral, weight 132 lb (59.9 kg), SpO2 94 %.   Gen. Pleasant, well-nourished, in no distress, normal affect  Lungs: no accessory muscle use Cardiovascular: RRR, no peripheral edema Neuro:  A&Ox3, CN II-XII intact, normal gait Skin:  Warm, dry, intact Port-a-cath in place R upper chest.  R medial breast a biopsy site non TTP, skin firm along incision.  Lateral portion of scar with firmness that continues further inferior than the rest of the scar.   Wt Readings from Last 3 Encounters:  11/14/17 132 lb (59.9 kg)  11/04/17 139 lb 14.4 oz (63.5 kg)  11/01/17 140 lb (63.5 kg)    Lab Results  Component Value Date   WBC 1.6 (L) 11/11/2017   HGB 10.1 (L) 11/11/2017   HCT 33.0 (L) 11/11/2017   PLT 58 (L) 11/11/2017   GLUCOSE 117 (H) 11/11/2017   CHOL 120 04/16/2016   TRIG 153.0 (H) 04/16/2016   HDL 34.90 (L) 04/16/2016   LDLDIRECT 151.3 08/24/2009   LDLCALC 54 04/16/2016   ALT 21 11/11/2017   AST 42 (H) 11/11/2017   NA 142 11/11/2017   K 4.0 11/11/2017   CL 108 11/11/2017   CREATININE 0.74 11/11/2017   BUN 13 11/11/2017   CO2 26 11/11/2017   TSH 2.72 09/20/2017   INR 1.11 10/26/2015   HGBA1C 4.9 01/19/2016   MICROALBUR 1.4 04/23/2016     Assessment/Plan:  BREAST CANCER, HX OF -L breast cancer, h/o metastatic adneocarcinoma with peritoneal carcinomatosis dx'd in 2014 likely from ovarian cancer. -followed by Oncology. -continue chemo (gemciabine and avastatin) and Pegfilgrastim treatments -continue to monitor for thrombocytopenia  RLS (restless legs syndrome) -continue requip  Long-term current use of lithium  -followed by psychiatry for h/o bipolar d/o. - Plan: Lithium level  F/u prn  More than 50% of over 20 minutes spent in total in caring for this patient was spent face-to-face with the patient, counseling and/or coordinating care.   Grier Mitts, MD

## 2017-11-15 LAB — LITHIUM LEVEL: Lithium Lvl: <0.3 mmol/L — ABNORMAL LOW (ref 0.6–1.2)

## 2017-11-17 ENCOUNTER — Encounter: Payer: Self-pay | Admitting: Family Medicine

## 2017-11-19 ENCOUNTER — Encounter: Payer: Self-pay | Admitting: Family Medicine

## 2017-11-19 ENCOUNTER — Telehealth: Payer: Self-pay | Admitting: Physician Assistant

## 2017-11-19 NOTE — Telephone Encounter (Signed)
PT got her lithium level done at PCP, Dr. Grier Mitts office. Her lithium level is low and her PCP is to fax it over to [place].

## 2017-11-20 ENCOUNTER — Telehealth: Payer: Self-pay | Admitting: Family Medicine

## 2017-11-20 NOTE — Telephone Encounter (Signed)
Labs are in epic  chart in box

## 2017-11-20 NOTE — Telephone Encounter (Signed)
Call and make sure she's taking Li qhs as directed and repeat level in a wk.  Verify the lab she prefers to go to.  Lab order is pending.

## 2017-11-20 NOTE — Telephone Encounter (Signed)
Spoke to the pt.  She last seen [facility] on 11/01/17.  Sx continue.  Cough is sometimes dry and sometimes productive.  She did not pick up the prednisone due to side effects.  Pt is immune compromised due to undergoing chemo.  She does not have fever or SOB.  Advised an appt with Dr. Volanda Napoleon so she can be re evaluated.  Pt agreed and scheduled tomorrow at 1:30.  Offered a morning appt to be sooner but pt did not wish to come in tomorrow morning.   Instructed pt to seek emergency care if fever of SOB occurred.  Pt agreed.  She will arrive at 1:15 for check in.

## 2017-11-20 NOTE — Telephone Encounter (Signed)
Copied from [place] [phone]. Topic: General - Other >> Nov 20, 2017  9:44 AM Janace Aris A wrote: Reason for CRM: patient is still having a productive cough, she says sometimes it's dry and sometimes she will throw up while having a coughing fit. She says she has been taking a coughing pill, and was prescribed prednizone by a PA and she did not take it because of the side effects. She said she was prescribed "magic mouth wash" and that seem to help when she worked for [employer] years ago.   Patient would like some advice on this.

## 2017-11-21 ENCOUNTER — Ambulatory Visit (INDEPENDENT_AMBULATORY_CARE_PROVIDER_SITE_OTHER): Payer: PPO | Admitting: Family Medicine

## 2017-11-21 ENCOUNTER — Encounter: Payer: Self-pay | Admitting: Family Medicine

## 2017-11-21 VITALS — BP 118/78 | HR 58 | Temp 98.3°F | Wt 137.0 lb

## 2017-11-21 DIAGNOSIS — J40 Bronchitis, not specified as acute or chronic: Secondary | ICD-10-CM | POA: Diagnosis not present

## 2017-11-21 MED ORDER — HYDROCODONE-HOMATROPINE 5-1.5 MG/5ML PO SYRP: 5.0000 mL | ORAL_SOLUTION | Freq: Three times a day (TID) | ORAL | 0 refills | Status: DC | PRN

## 2017-11-21 MED ORDER — DOXYCYCLINE HYCLATE 100 MG PO TABS: 100.0000 mg | ORAL_TABLET | Freq: Two times a day (BID) | ORAL | 0 refills | Status: AC

## 2017-11-21 NOTE — Telephone Encounter (Signed)
Spoke with patient and she's not been taking because she's been sick. So lab not accurate. Going to her doctor today but has bronchitis and is feeling awful. Instructed once she's feeling better to try and get back on her normal dose of lithium, then after consistently taking for 7-10 days to repeat her lithium level. Pt verbalized understanding.

## 2017-11-21 NOTE — Patient Instructions (Signed)

## 2017-11-21 NOTE — Telephone Encounter (Signed)
Great.  Thanks

## 2017-11-21 NOTE — Progress Notes (Signed)
Subjective:    Patient ID: [name], female    DOB: 22-Mar-1955, 62 y.o.   MRN: [id number]  No chief complaint on file.   HPI Patient was seen today for f/u on ongoing concern.  Pt with continued cough, at times dry, then productive.  Also endorses slight sore throat, rhinorrhea, subj. fever, and ear pressure.  Pt states when her symptoms were initially started she was given Azithromycin which helped some.  She was seen again on 11/01/17, given tessalon, a duo neb breathing treatment, prednisone.  Pt remembered she "crawled up the wall" literally, the last time she had prednisone.  Pt undergoing chemo for ovarian cancer.  Unsure of sick contacts.  Past Medical History:  Diagnosis Date  . ADD (attention deficit disorder)   . Allergy    seasonal  . Anxiety   . Bipolar disorder ([place])   . Breast cancer ([place]) 1989   right breast  . Bronchitis 10/26/2017  . Cancer ([place])    Omentum  . Coronary artery disease   . Depression   . Hematochezia   . History of echocardiogram    a. Limited Echo 9/17: EF 60-65%, no RWMA, Gr 1 DD, trivial AI, trivial MR, GLS -12% (likely underestimated), no pericardial effusion  . Hyperlipidemia   . Hypothyroidism   . Maintenance chemotherapy    Pt has chemo every 3 weeks (on Friday)  . NSTEMI (non-ST elevated myocardial infarction) ([place]) 10/26/2015  . Osteopenia 03/2009   t score -2.1 FRAX 4.6/0.4  . Personal history of chemotherapy   . Personal history of radiation therapy   . Premature menopause   . Sleep apnea    mild  . Urinary incontinence     Allergies  Allergen Reactions  . Doxil [Doxorubicin Hcl Liposomal] Anaphylaxis    1st Doxil.   Marland Kitchen Bee Pollen     Pollen and grass causes a lot sneezing  . Pollen Extract Other (See Comments)    Pollen and grass causes a lot sneezing    ROS General: Denies chills, night sweats, changes in weight, changes in appetite  +subjective fever HEENT: Denies headaches, ear pain, changes in vision  +  rhinorrhea, sore throat, ear pressure CV: Denies CP, palpitations, SOB, orthopnea Pulm: Denies SOB, wheezing  +cough GI: Denies abdominal pain, nausea, vomiting, diarrhea, constipation GU: Denies dysuria, hematuria, frequency, vaginal discharge Msk: Denies muscle cramps, joint pains Neuro: Denies weakness, numbness, tingling Skin: Denies rashes, bruising Psych: Denies depression, anxiety, hallucinations     Objective:    Blood pressure 118/78, pulse (!) 58, temperature 98.3 F (36.8 C), temperature source Oral, weight 137 lb (62.1 kg), SpO2 95 %.   Gen. Pleasant, well-nourished, in no distress, normal affect  HEENT: [place]/AT, face symmetric, no scleral icterus, PERRLA, nares patent without drainage, pharynx without erythema or exudate. Lungs: dry cough, no accessory muscle use, faint wheeze in b/l base, otherwise clear Cardiovascular: RRR, no m/r/g, no peripheral edema Neuro:  A&Ox3, CN II-XII intact, normal gait  Wt Readings from Last 3 Encounters:  11/21/17 137 lb (62.1 kg)  11/14/17 132 lb (59.9 kg)  11/04/17 139 lb 14.4 oz (63.5 kg)    Lab Results  Component Value Date   WBC 1.6 (L) 11/11/2017   HGB 10.1 (L) 11/11/2017   HCT 33.0 (L) 11/11/2017   PLT 58 (L) 11/11/2017   GLUCOSE 117 (H) 11/11/2017   CHOL 120 04/16/2016   TRIG 153.0 (H) 04/16/2016   HDL 34.90 (L) 04/16/2016   LDLDIRECT 151.3 08/24/2009  LDLCALC 54 04/16/2016   ALT 21 11/11/2017   AST 42 (H) 11/11/2017   NA 142 11/11/2017   K 4.0 11/11/2017   CL 108 11/11/2017   CREATININE 0.74 11/11/2017   BUN 13 11/11/2017   CO2 26 11/11/2017   TSH 2.72 09/20/2017   INR 1.11 10/26/2015   HGBA1C 4.9 01/19/2016   MICROALBUR 1.4 04/23/2016    Assessment/Plan:  Bronchitis  -given handout, discussed likely dz curse. -will start doxycycline - Plan: HYDROcodone-homatropine (HYCODAN) 5-1.5 MG/5ML syrup, doxycycline (VIBRA-TABS) 100 MG tablet  F/u prn for worsening or continued symptoms  Grier Mitts, MD

## 2017-11-22 ENCOUNTER — Encounter (HOSPITAL_COMMUNITY): Payer: Self-pay | Admitting: Emergency Medicine

## 2017-11-22 ENCOUNTER — Other Ambulatory Visit: Payer: Self-pay

## 2017-11-22 ENCOUNTER — Ambulatory Visit: Payer: Self-pay | Admitting: *Deleted

## 2017-11-22 ENCOUNTER — Other Ambulatory Visit: Payer: Self-pay | Admitting: Hematology

## 2017-11-22 ENCOUNTER — Emergency Department (HOSPITAL_COMMUNITY)
Admission: EM | Admit: 2017-11-22 | Discharge: 2017-11-22 | Disposition: A | Discharge status: Home / Self Care | Payer: PPO | Attending: Emergency Medicine | Admitting: Emergency Medicine

## 2017-11-22 ENCOUNTER — Emergency Department (HOSPITAL_COMMUNITY): Payer: PPO

## 2017-11-22 DIAGNOSIS — Z859 Personal history of malignant neoplasm, unspecified: Secondary | ICD-10-CM

## 2017-11-22 DIAGNOSIS — I2510 Atherosclerotic heart disease of native coronary artery without angina pectoris: Secondary | ICD-10-CM | POA: Diagnosis not present

## 2017-11-22 DIAGNOSIS — C481 Malignant neoplasm of specified parts of peritoneum: Secondary | ICD-10-CM | POA: Diagnosis not present

## 2017-11-22 DIAGNOSIS — R079 Chest pain, unspecified: Secondary | ICD-10-CM

## 2017-11-22 DIAGNOSIS — Z79899 Other long term (current) drug therapy: Secondary | ICD-10-CM | POA: Insufficient documentation

## 2017-11-22 DIAGNOSIS — R059 Cough, unspecified: Secondary | ICD-10-CM

## 2017-11-22 DIAGNOSIS — E785 Hyperlipidemia, unspecified: Secondary | ICD-10-CM | POA: Diagnosis not present

## 2017-11-22 DIAGNOSIS — I10 Essential (primary) hypertension: Secondary | ICD-10-CM | POA: Insufficient documentation

## 2017-11-22 DIAGNOSIS — R072 Precordial pain: Secondary | ICD-10-CM | POA: Diagnosis not present

## 2017-11-22 DIAGNOSIS — R0602 Shortness of breath: Secondary | ICD-10-CM | POA: Diagnosis not present

## 2017-11-22 DIAGNOSIS — D696 Thrombocytopenia, unspecified: Secondary | ICD-10-CM | POA: Diagnosis not present

## 2017-11-22 DIAGNOSIS — R05 Cough: Secondary | ICD-10-CM | POA: Insufficient documentation

## 2017-11-22 LAB — I-STAT BETA HCG BLOOD, ED (NOT ORDERABLE)

## 2017-11-22 LAB — CBC
HEMATOCRIT: 32.8 % — AB (ref 36.0–46.0)
HEMOGLOBIN: 10.1 g/dL — AB (ref 12.0–15.0)
MCH: 30.8 pg (ref 26.0–34.0)
MCHC: 30.8 g/dL (ref 30.0–36.0)
MCV: 100 fL (ref 78.0–100.0)
Platelets: 14 10*3/uL — CL (ref 150–400)
RBC: 3.28 MIL/uL — AB (ref 3.87–5.11)
RDW: 18.4 % — ABNORMAL HIGH (ref 11.5–15.5)
WBC: 9.8 10*3/uL (ref 4.0–10.5)

## 2017-11-22 LAB — BASIC METABOLIC PANEL
ANION GAP: 8 (ref 5–15)
BUN: 9 mg/dL (ref 6–20)
CALCIUM: 9.2 mg/dL (ref 8.9–10.3)
CO2: 28 mmol/L (ref 22–32)
Chloride: 110 mmol/L (ref 98–111)
Creatinine, Ser: 0.8 mg/dL (ref 0.44–1.00)
GLUCOSE: 81 mg/dL (ref 70–99)
POTASSIUM: 4.9 mmol/L (ref 3.5–5.1)
Sodium: 146 mmol/L — ABNORMAL HIGH (ref 135–145)

## 2017-11-22 LAB — DIFFERENTIAL
BASOS PCT: 0 %
Basophils Absolute: 0 10*3/uL (ref 0.0–0.1)
EOS ABS: 0 10*3/uL (ref 0.0–0.7)
Eosinophils Relative: 0 %
LYMPHS PCT: 17 %
Lymphs Abs: 1.7 10*3/uL (ref 0.7–4.0)
MONO ABS: 1 10*3/uL (ref 0.1–1.0)
Monocytes Relative: 10 %
NEUTROS PCT: 73 %
Neutro Abs: 7.1 10*3/uL (ref 1.7–7.7)

## 2017-11-22 LAB — POCT I-STAT TROPONIN I
TROPONIN I, POC: 0 ng/mL (ref 0.00–0.08)
Troponin i, poc: 0 ng/mL (ref 0.00–0.08)

## 2017-11-22 LAB — BRAIN NATRIURETIC PEPTIDE: B NATRIURETIC PEPTIDE 5: 93.5 pg/mL (ref 0.0–100.0)

## 2017-11-22 MED ORDER — HEPARIN SOD (PORK) LOCK FLUSH 100 UNIT/ML IV SOLN
500.0000 [IU] | Freq: Once | INTRAVENOUS | Status: AC
  Administered 2017-11-22: 500 [IU]
  Filled 2017-11-22: qty 5

## 2017-11-22 MED ORDER — SODIUM CHLORIDE 0.9 % IJ SOLN
INTRAMUSCULAR | Status: AC
  Filled 2017-11-22: qty 50

## 2017-11-22 MED ORDER — IOPAMIDOL (ISOVUE-370) INJECTION 76%
100.0000 mL | Freq: Once | INTRAVENOUS | Status: AC | PRN
  Administered 2017-11-22: 100 mL via INTRAVENOUS

## 2017-11-22 MED ORDER — IOPAMIDOL (ISOVUE-370) INJECTION 76%
INTRAVENOUS | Status: AC
  Filled 2017-11-22: qty 100

## 2017-11-22 NOTE — ED Provider Notes (Signed)
[facility] DEPT Provider Note   CSN: [id number] Arrival date & time: 11/22/17  1625     History   Chief Complaint Chief Complaint  Patient presents with  . Shortness of Breath  . Chest Pain    HPI [name] is a 62 y.o. female with a history of omental cancer followed by Dr. Alen Blew of oncology currently undergoing chemotherapy (new regimen started 4 weeks ago), CAD with prior MI in 2017 status post drug-eluting stent placement of the LAD, hypertension, hyperlipidemia, hypothyroidism who presents emergency department today for chest pain shortness of breath.  Patient reports that beginning of September she started having nasal congestion, sinus pressure, and a dry cough.  She was seen by her PCP and treated with Augmentin for sinus infection.  She notes her sinus pressure improved however she continued to have a dry cough.  She was seen again and told that she had bronchitis and was given prednisone but reported the side effects made her not take this.  She continued to have a cough and was seen by her PCP yesterday for this.  She was prescribed doxycycline for a suspected pneumonia.  She has had 2 doses this prior to arrival.  She notes she woke up this morning and had new shortness of breath as well as chest pain.  She reports the chest pain occurs after episodes of coughing and is located substernal without radiation.  There is no radiation to her neck, jaw, either shoulder, back or abdomen.  She notes the pain typically lasts for 5-10 minutes before being resolved.  She has had approximately 4 episodes prior to arrival including it 3 AM, 7:30 AM, noon and on the way over to the ED.    She states this feels different than when she has had her heart attack in the past.  She reports that her shortness of breath is constant.  Her chest pain shortness of breath is not worsened with exertion.  She denies any pleuritic symptoms or positional symptoms.  Her cough is  dry in nature without sputum production or hemoptysis.  She denies any fever, chills, lower extremity swelling.  No abdominal pain, nausea/vomiting/diarrhea.  Patient is taking 81 mg aspirin today.  Patient is not on ACE inhibitor that would be contributing to her cough. Patient's last echocardiogram was on 11/17/2015 and showed a EF of 60-65% with normal wall motion and G1 DD. Last hear cath was on 11/08/15. Patient has been taking hydrocodoe cough syrup for her symptoms without any relief. She reports family history of heart disease including her mother. Patient is a non-smoker. No hx of COPD. No chest pain currently.   11/08/15 heart cath -   2nd RPLB lesion, 30 %stenosed.  Dist LAD lesion, 0 %stenosed.  A drug eluting .  Mid Cx lesion, 30 %stenosed.  Ost RPDA to RPDA lesion, 60 %stenosed.  HPI  Past Medical History:  Diagnosis Date  . ADD (attention deficit disorder)   . Allergy    seasonal  . Anxiety   . Bipolar disorder ([place])   . Breast cancer ([place]) 1989   right breast  . Bronchitis 10/26/2017  . Cancer ([place])    Omentum  . Coronary artery disease   . Depression   . Hematochezia   . History of echocardiogram    a. Limited Echo 9/17: EF 60-65%, no RWMA, Gr 1 DD, trivial AI, trivial MR, GLS -12% (likely underestimated), no pericardial effusion  . Hyperlipidemia   .  Hypothyroidism   . Maintenance chemotherapy    Pt has chemo every 3 weeks (on Friday)  . NSTEMI (non-ST elevated myocardial infarction) ([place]) 10/26/2015  . Osteopenia 03/2009   t score -2.1 FRAX 4.6/0.4  . Personal history of chemotherapy   . Personal history of radiation therapy   . Premature menopause   . Sleep apnea    mild  . Urinary incontinence     Patient Active Problem List   Diagnosis Date Noted  . Ovarian cancer ([place]) 10/29/2017  . Need for prophylactic vaccination and inoculation against influenza 12/10/2016  . Encounter for antineoplastic chemotherapy 08/29/2016  . RLS (restless legs  syndrome) 04/23/2016  . Nausea and vomiting in adult 02/09/2016  . Cellulitis of right breast 01/04/2016  . GERD (gastroesophageal reflux disease) 12/09/2015  . Presence of drug coated stent in LAD coronary artery 11/08/2015  . 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 Thrombocytopenia ([place]) 11/04/2015  . Coronary artery disease involving native heart without angina pectoris 11/03/2015  . NSTEMI (non-ST elevated myocardial infarction) ([place])   . Pain in the chest 10/25/2015  . Pancytopenia ([place]) 10/25/2015  . Elevated troponin 10/25/2015  . Chest wall pain 07/15/2015  . Genetic testing 07/04/2015  . Port-A-Cath in place 06/16/2015  . Dizziness 03/29/2015  . 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 Malignant neoplasm of female breast ([place]) 09/19/2012  . Lithium use 09/17/2011  . Abnormal coordination 09/17/2011  . Premature menopause   . Depression   . Anxiety   . Bipolar disorder ([place])   . ADD (attention deficit disorder)   . NONSPECIFIC ABNORMAL ELECTROCARDIOGRAM 11/04/2009  . ALLERGIC RHINITIS 09/01/2009  . NONSPEC ELEVATION OF LEVELS OF TRANSAMINASE/LDH 07/28/2008  . MOTOR RESTLESSNESS 02/19/2008  . Internal hemorrhoids without mention of complication [id number]  . ESOPHAGEAL STRICTURE 09/15/2007  . Obstructive sleep apnea 07/21/2007  . Hyperlipidemia 07/07/2007  . Osteopenia 02/20/2007  . Hypothyroidism 10/01/2006  . BREAST CANCER, HX OF 08/12/2006    Past Surgical History:  Procedure Laterality Date  . BREAST BIOPSY Left 07/07/2015   malignant x 2  . BREAST LUMPECTOMY Right 1989  . BREAST SURGERY  1989   RIGHT LUMPECTOMY, RADIATION AND CHEMO  . CARDIAC CATHETERIZATION N/A 10/26/2015   Procedure: Left Heart Cath and Coronary Angiography;  Surgeon: Wellington Hampshire, MD;  Location: [facility];  Service: Cardiovascular;  Laterality: N/A;  . CARDIAC CATHETERIZATION N/A 10/26/2015   Procedure: Coronary Stent Intervention;  Surgeon: Wellington Hampshire, MD;   Location: [facility];  Service: Cardiovascular;  Laterality: N/A;  . CARDIAC CATHETERIZATION N/A 11/08/2015   Procedure: Left Heart Cath and Coronary Angiography;  Surgeon: Jolaine Artist, MD;  Location: [facility];  Service: Cardiovascular;  Laterality: N/A;  . drug eluting stent  10/26/2015  . FOOT SURGERY  2013   BILATERAL   . HYSTEROSCOPY  2011   [place]  . PELVIC LAPAROSCOPY/ Hysteroscopy  1996     OB History    Gravida  0   Para      Term      Preterm      AB      Living        SAB      TAB      Ectopic      Multiple      Live Births               Home Medications    Prior to Admission medications  Medication Sig Start Date End Date Taking? Authorizing Provider  acetaminophen (TYLENOL) 500 MG tablet Take 500 mg by mouth every 6 (six) hours as needed for moderate pain.   Yes [provider]  Acetylcarnitine HCl (ACETYL L-CARNITINE PO) Take 2,000 mg by mouth at bedtime.   Yes [provider]  aspirin 81 MG chewable tablet Chew 1 tablet (81 mg total) by mouth daily. 10/27/15  Yes Florencia Reasons, MD  atorvastatin (LIPITOR) 40 MG tablet Take 1 tablet (40 mg total) by mouth daily. 04/02/17  Yes [name], [name], MD  carvedilol (COREG) 3.125 MG tablet Take 1/2 tablet by mouth twice daily with meals. Patient taking differently: Take 0.5 mg by mouth 2 (two) times daily with a meal. Take 1/2 tablet by mouth twice daily with meals. 09/13/17  Yes Fay Records, MD  divalproex (DEPAKOTE ER) 250 MG 24 hr tablet Take 250 mg by mouth daily.  11/10/17  Yes [provider]  divalproex (DEPAKOTE ER) 500 MG 24 hr tablet Take 1,000 mg by mouth every evening.  11/10/17  Yes [provider]  doxycycline (VIBRA-TABS) 100 MG tablet Take 1 tablet (100 mg total) by mouth 2 (two) times daily for 7 days. 11/21/17 11/28/17 Yes Billie Ruddy, MD  HYDROcodone-homatropine (HYCODAN) 5-1.5 MG/5ML syrup Take 5 mLs by mouth every 8 (eight) hours as  needed for cough. 11/21/17  Yes Billie Ruddy, MD  lamoTRIgine (LAMICTAL) 100 MG tablet Take 100 mg by mouth 2 (two) times daily.   Yes [provider]  levothyroxine (SYNTHROID, LEVOTHROID) 100 MCG tablet Take 1 tablet (100 mcg total) by mouth daily. 08/29/17  Yes [name], [name], MD  ondansetron (ZOFRAN ODT) 8 MG disintegrating tablet Take 1 tablet (8 mg total) by mouth every 8 (eight) hours as needed for nausea or vomiting. 08/29/16  Yes Curcio, Roselie Awkward, NP  pantoprazole (PROTONIX) 40 MG tablet Take 1 tablet (40 mg total) by mouth daily. 10/14/17  Yes [name], [name], MD  rOPINIRole (REQUIP) 0.5 MG tablet Take 3 tablets (1.5 mg total) by mouth at bedtime. 10/18/17  Yes [name], [name], MD  sertraline (ZOLOFT) 25 MG tablet Take 75 mg by mouth daily.  11/10/17  Yes [provider]  acetaminophen (TYLENOL) 325 MG tablet Take 2 tablets (650 mg total) by mouth every 4 (four) hours as needed for mild pain, fever or headache. Patient not taking: Reported on 11/22/2017 01/11/16   Robbie Lis, MD  ALPRAZolam Duanne Moron) 0.5 MG tablet Take 0.5 mg by mouth 2 (two) times daily as needed for anxiety.    [provider]  benzonatate (TESSALON) 200 MG capsule Take 1 capsule (200 mg total) by mouth 2 (two) times daily as needed for cough. 11/01/17   Nafziger, Tommi Rumps, NP  clotrimazole-betamethasone (LOTRISONE) cream Apply 1 application topically 2 (two) times daily. Patient taking differently: Apply 1 application topically 2 (two) times daily as needed (rash).  06/25/17   [name], [name], MD  fluticasone [facility]) 50 MCG/ACT nasal spray Place 1 spray into both nostrils daily as needed for allergies.  08/25/15   [provider]  ipratropium (ATROVENT) 0.06 % nasal spray Place 2 sprays into both nostrils 4 (four) times daily. Patient not taking: Reported on 11/22/2017 08/30/17   Vivi Barrack, MD  lidocaine-prilocaine (EMLA) cream Apply topically as needed. Apply to port with every  chemotherapy. 06/17/17   Wyatt Portela, MD  loperamide (IMODIUM A-D) 2 MG tablet Take 2 mg by mouth as  needed for diarrhea or loose stools.    [provider]  loratadine (CLARITIN) 10 MG tablet Take 10 mg by mouth daily as needed for allergies (with chemo).    [provider]  nitroGLYCERIN (NITROSTAT) 0.4 MG SL tablet Place 1 tablet (0.4 mg total) under the tongue every 5 (five) minutes as needed for chest pain. 11/24/15   Fay Records, MD  ondansetron (ZOFRAN) 8 MG tablet TAKE 1 TABLET BY MOUTH EVERY 8 HOURS AS NEEDED FOR NAUSEA AND VOMITING Patient not taking: Reported on 11/22/2017 01/02/16   Wyatt Portela, MD  prochlorperazine (COMPAZINE) 10 MG tablet Take 1 tablet (10 mg total) by mouth every 6 (six) hours as needed for nausea or vomiting. 02/23/16   Wyatt Portela, MD    Family History Family History  Problem Relation Age of Onset  . Breast cancer Maternal Aunt 29  . Diabetes Maternal Grandmother   . Heart disease Maternal Grandmother   . Heart disease Maternal Grandfather   . Hypertension Paternal Grandfather   . Heart Problems Paternal Grandfather   . Breast cancer Maternal Aunt        dx. early 76s; had negative GT approx 10 years ago  . Breast cancer Maternal Aunt        dx. 4s with recurrence  . Allergies Father   . Heart disease Mother   . Other Mother        hx of hysterectomy   . Liver cancer Cousin 43       +EtOH  . Cancer Cousin        paternal 1st cousin, once-removed dx. NOS cancer (maybe ovarian) in late 30s-40s  . Cancer Cousin        female paternal 2nd cousin d. of NOS cancer in her 20s-early 80s  . Ovarian cancer Neg Hx   . Colon cancer Neg Hx     Social History Social History   Tobacco Use  . Smoking status: Never Smoker  . Smokeless tobacco: Never Used  Substance Use Topics  . Alcohol use: Yes    Alcohol/week: 0.0 standard drinks    Comment: very rarely - maybe 1 glass wine every 3 mos  . Drug use: No     Allergies     Doxil [doxorubicin hcl liposomal]; Pollen extract; and Prednisone   Review of Systems Review of Systems  All other systems reviewed and are negative.    Physical Exam Updated Vital Signs BP 116/60   Pulse (!) 55   Temp 97.6 F (36.4 C) (Oral)   Resp 13   SpO2 99%   Physical Exam  Constitutional: She appears well-developed and well-nourished.  HENT:  Head: Normocephalic and atraumatic.  Right Ear: External ear normal.  Left Ear: External ear normal.  Nose: Nose normal.  Mouth/Throat: Uvula is midline, oropharynx is clear and moist and mucous membranes are normal. No tonsillar exudate.  Eyes: Pupils are equal, round, and reactive to light. Right eye exhibits no discharge. Left eye exhibits no discharge. No scleral icterus.  Neck: Trachea normal. Neck supple. No JVD present. No spinous process tenderness present. Carotid bruit is not present. No neck rigidity. Normal range of motion present.  Cardiovascular: Normal rate, regular rhythm and intact distal pulses.  No murmur heard. Pulses:      Radial pulses are 2+ on the right side, and 2+ on the left side.       Dorsalis pedis pulses are 2+ on the right side, and 2+ on  the left side.       Posterior tibial pulses are 2+ on the right side, and 2+ on the left side.  No lower extremity swelling or edema. Calves symmetric in size bilaterally.  Pulmonary/Chest: Effort normal and breath sounds normal. She exhibits no tenderness.  Patient sating at 99% on monitor with good waveform. No increased work of breathing. No accessory muscle use. Patient is sitting upright, speaking in full sentences without difficulty   Abdominal: Soft. Bowel sounds are normal. She exhibits no distension. There is no tenderness. There is no rigidity, no rebound, no guarding and no CVA tenderness.  Musculoskeletal: She exhibits no edema.  Lymphadenopathy:    She has no cervical adenopathy.  Neurological: She is alert.  Skin: Skin is warm and dry. No rash  noted. She is not diaphoretic.  Psychiatric: She has a normal mood and affect.  Nursing note and vitals reviewed.    ED Treatments / Results  Labs (all labs ordered are listed, but only abnormal results are displayed) Labs Reviewed  BASIC METABOLIC PANEL - Abnormal; Notable for the following components:      Result Value   Sodium 146 (*)    All other components within normal limits  CBC - Abnormal; Notable for the following components:   RBC 3.28 (*)    Hemoglobin 10.1 (*)    HCT 32.8 (*)    RDW 18.4 (*)    Platelets 14 (*)    All other components within normal limits  BRAIN NATRIURETIC PEPTIDE  DIFFERENTIAL  I-STAT TROPONIN, ED  I-STAT BETA HCG BLOOD, ED ([facility], [facility], [facility] ONLY)  POCT I-STAT TROPONIN I  I-STAT BETA HCG BLOOD, ED (NOT ORDERABLE)  I-STAT TROPONIN, ED  POCT I-STAT TROPONIN I    EKG None  Radiology Dg Chest 2 View  Result Date: 11/22/2017 CLINICAL DATA:  Mid and upper chest pain beginning last night. Shortness of breath and cough for 3 weeks. EXAM: CHEST - 2 VIEW COMPARISON:  CT chest 10/11/2017.  PA and lateral chest 01/04/2016. FINDINGS: The lungs are clear. Heart size is normal. No pneumothorax or pleural effusion. No acute or focal bony abnormality. Port-A-Cath is unchanged. The patient is status post right mastectomy and axillary dissection. IMPRESSION: No acute disease. Electronically Signed   By: Inge Rise M.D.   On: 11/22/2017 17:03   Ct Angio Chest Pe W/cm &/or Wo Cm  Result Date: 11/22/2017 CLINICAL DATA:  Breast cancer, shortness of breath, chest pain EXAM: CT ANGIOGRAPHY CHEST WITH CONTRAST TECHNIQUE: Multidetector CT imaging of the chest was performed using the standard protocol during bolus administration of intravenous contrast. Multiplanar CT image reconstructions and MIPs were obtained to evaluate the vascular anatomy. CONTRAST:  111mL ISOVUE-370 IOPAMIDOL (ISOVUE-370) INJECTION 76% COMPARISON:  10/11/2017 FINDINGS: Cardiovascular: No  significant pulmonary arterial filling defect or embolus demonstrated by CTA. Air within the pulmonary outflow tract from peripheral IV access. Atherosclerosis of the thoracic aorta and arch vessels. Thoracic aorta intact. Negative for aneurysm, dissection, mediastinal hemorrhage or hematoma. Three-vessel arch anatomy appearing patent. Native coronary atherosclerosis noted. Normal heart size. No pericardial effusion. Mediastinum/Nodes: Mild right paratracheal adenopathy, slightly larger measuring 19 mm in short axis, previously 11 mm. No other adenopathy appreciated. Trachea and central airways are patent. Esophagus is nondilated. No hiatal hernia. Lungs/Pleura: No focal airspace process, collapse or consolidation. Negative for edema, interstitial disease or fibrosis. Minor dependent basilar atelectasis. No pleural abnormality, effusion or pneumothorax. Upper Abdomen: No acute finding. Known peritoneal carcinomatosis is only partially imaged  in the right upper quadrant. Atherosclerosis of the abdominal aorta. Musculoskeletal: Remote right mastectomy and axillary lymph node dissection. Right IJ port catheter tip SVC RA junction. Thoracic spondylosis noted. No acute osseous finding. Review of the MIP images confirms the above findings. IMPRESSION: Negative for significant acute pulmonary embolus by CTA. Thoracic aortic atherosclerosis without acute vascular finding. Native coronary atherosclerosis Continued mild progression of right paratracheal adenopathy concerning for metastatic disease. Remote right mastectomy and axillary lymph node dissection. Right upper quadrant peritoneal carcinomatosis, partially imaged. No other acute intrathoracic finding. Aortic Atherosclerosis (ICD10-I70.0). Electronically Signed   By: Jerilynn Mages.  Shick M.D.   On: 11/22/2017 20:18    Procedures Procedures (including critical care time)  Medications Ordered in ED Medications  iopamidol (ISOVUE-370) 76 % injection (has no administration in  time range)  sodium chloride 0.9 % injection (has no administration in time range)  iopamidol (ISOVUE-370) 76 % injection 100 mL (100 mLs Intravenous Contrast Given 11/22/17 1956)  heparin lock flush 100 unit/mL (500 Units Intracatheter Given 11/22/17 2233)     Initial Impression / Assessment and Plan / ED Course  I have reviewed the triage vital signs and the nursing notes.  Pertinent labs & imaging results that were available during my care of the patient were reviewed by me and considered in my medical decision making (see chart for details).     62 y.o. with a history of mental cancer followed by Dr. Alen Blew of oncology currently undergoing chemotherapy with her last transfusion on 9/23 and 9/24, as well as CAD with a history of MI in 2017 status post drug-eluting stent placement of the LAD who presents emergency department today for 1 month history of cough with 1 day history of chest pain shortness of breath.  Patient notes that cough began 1 month ago has been dry in nature.  She is completed a course of Augmentin for symptoms.  She was diagnosed with bronchitis in the midst of her symptoms as well but never filled her prednisone.  She was clinically diagnosed with pneumonia yesterday and prescribed doxycycline.  She has had 2 doses of this.  She reports upon awakening today she had some shortness of breath at rest that was not worsened with exertion.  She noted some central chest pain that occurred only after coughing.  Her chest pain does not radiate and is not associate with nausea, vomiting, diaphoresis.  Her chest pain is not worsened with exertion.  She states this feels different than her prior chest pain the past.  Patient received thorough work-up in the department.  She remained chest pain-free. Patinets vital signs stable, no tracheal deviation, no JVD or new murmur, RRR, breath sounds equal bilaterally, EKG without acute abnormalities, negative troponin x 2, and negative CXR.  Patient's  CTA without evidence of PE, pneumonia, dissection or other abnormalities to explain patient's pain.  This does show atherosclerosis however patient's symptoms are atypical of ACS and she has had negative troponins x2.  Do not feel she needs to be admitted for further work-up.  She has been chest pain-free in the department.  This does show continued changes that are concerning for metastatic disease.  Patient made aware of this and states understanding.  I have included this on her discharge paperwork.  Patient's blood work is significant for platelet count of 14,000.  Patient does report that on Wednesday she had an episode of epistaxis that lasted for approximately 15 minutes.  She notes that she has had some intermittent bloody  stools over the last 1 month but nothing in the last 1 week.  No melena.  She denies any vaginal bleeding.  No other bleeding noted.  Hemoglobin is baseline.  She is remained without any tachycardia or significant drops in blood pressure.  Discussed full case with Dr. Burr Medico. She has reviewed patients chart. Recommends to hold off on platelet transfusion at this time. Recommendeds stopping ASA and avoiding NSAIDs. Recommended follow up on Monday. Return for any bleeding.   I had a long discussion with the patient about her findings and reason returns to department.  Patient and her husband state understanding and agreement.  She will stop her NSAIDs and aspirin.  She understands to follow-up with her oncologist on Monday for repeat blood work.  Patient has also been advised to return to the ED if chest pain becomes exertional, associated with diaphoresis or nausea, radiates to left jaw/arm, worsens or becomes concerning in any way. Patient appears reliable for follow up and is agreeable to discharge. I advised the patient to follow-up with their primary care provider in regards to today chest pain and sob, early next week. I advised the patient to return to the emergency department with  new or worsening symptoms or new concerns. The patient verbalized understanding and agreement with plan. Patient stable   Patient case discussed with Dr. Gilford Raid who is in agreement with plan.  Final Clinical Impressions(s) / ED Diagnoses   Final diagnoses:  Cough  History of cancer  Shortness of breath  Nonspecific chest pain  Thrombocytopenia [facility])    ED Discharge Orders    None       Lorelle Gibbs 11/23/17 Ronald Pippins, MD 11/25/17 [phone]

## 2017-11-22 NOTE — Telephone Encounter (Signed)
Pt called with having some chest pain in the center of her chest. Her b/p is 117/70 and HR 52. She has a history of having a heart attack about 4 years ago. This does not feel like the one she had then. She did vomit this morning after taking her antibiotic and cough med prescribed by Dr. Volanda Napoleon yesterday.  She was diagnosed with bronchitis. She now denies n/v, sweating, dizziness or weakness. She states some shortness of breath when she coughs. With her hx of having a heart attack and per  protocol, pt is going to the emergency department at [facility].   Reason for Disposition . Chest pain lasts > 5 minutes (Exceptions: chest pain occurring > 3 days ago and now asymptomatic; same as previously diagnosed heartburn and has accompanying sour taste in mouth)  Answer Assessment - Initial Assessment Questions 1. LOCATION: "Where does it hurt?"       Mid chest 2. RADIATION: "Does the pain go anywhere else?" (e.g., into neck, jaw, arms, back)     no 3. ONSET: "When did the chest pain begin?" (Minutes, hours or days)      Last night 4. PATTERN "Does the pain come and go, or has it been constant since it started?"  "Does it get worse with exertion?"   come and go 5. DURATION: "How long does it last" (e.g., seconds, minutes, hours)     No sure 6. SEVERITY: "How bad is the pain?"  (e.g., Scale 1-10; mild, moderate, or severe)    - MILD (1-3): doesn't interfere with normal activities     - MODERATE (4-7): interferes with normal activities or awakens from sleep    - SEVERE (8-10): excruciating pain, unable to do any normal activities       moderate 7. CARDIAC RISK FACTORS: "Do you have any history of heart problems or risk factors for heart disease?" (e.g., prior heart attack, angina; high blood pressure, diabetes, being overweight, high cholesterol, smoking, or strong family history of heart disease)     Hx of heart attack about 4 years ago, on cholesterol medication, hx of heart dx , angina 8.  PULMONARY RISK FACTORS: "Do you have any history of lung disease?"  (e.g., blood clots in lung, asthma, emphysema, birth control pills)     no 9. CAUSE: "What do you think is causing the chest pain?"     possibly skeletal from coughing so much 10. OTHER SYMPTOMS: "Do you have any other symptoms?" (e.g., dizziness, nausea, vomiting, sweating, fever, difficulty breathing, cough)       Cough, some shortness of breath 11. PREGNANCY: "Is there any chance you are pregnant?" "When was your last menstrual period?"       no  Protocols used: CHEST PAIN-A-AH

## 2017-11-22 NOTE — Discharge Instructions (Addendum)
Your workup was reassuring.  As we discussed your platelets are currently 14,000.  I spoke with the oncologist on-call and they do not feel you need a transfusion at this time.  They would like you to follow-up in Dr. Hazeline Junker office on Monday morning to have repeat blood work.  Please return if you have any active bleeding from anywhere in your body.  This includes out of your nose, lines, your stool, urine, sputum etc.  Please see attached handout. Please avoid Asprin, and nsaids (ibuprofen, naproxen etc).   Your blood work, Secretary/administrator were reassuring. Please see attached handout on chest pain. You can discontinue the antibiotics.  Please return for any change in your chest pain.  Please return if your chest pain becomes exertional worsens with walking anyway.  Please return if your chest pain is associated with shortness of breath, nausea, vomiting, sweating.  Please return if your chest pain radiates to your neck, jaw, shoulder or back. If you develop worsening or new concerning symptoms you can return to the emergency department for re-evaluation.   Noted CT scan of your chest results:  Negative for significant acute pulmonary embolus by CTA. Continued mild progression of right paratracheal adenopathy concerning for metastatic disease. Right upper quadrant peritoneal carcinomatosis, partially imaged.

## 2017-11-22 NOTE — ED Notes (Signed)
ED Provider at bedside. 

## 2017-11-22 NOTE — ED Notes (Signed)
Patient transported to CT 

## 2017-11-22 NOTE — ED Triage Notes (Addendum)
Patient here from home with complaints of mid upper chest pain that started last night. SOB x3 weeks, diagnosed with bronchitis and doxycycline with no relief. Cough. Chemo patient. 1 nitro with some relief on the way here.

## 2017-11-25 ENCOUNTER — Inpatient Hospital Stay: Payer: PPO

## 2017-11-25 ENCOUNTER — Telehealth: Payer: Self-pay

## 2017-11-25 ENCOUNTER — Inpatient Hospital Stay (HOSPITAL_BASED_OUTPATIENT_CLINIC_OR_DEPARTMENT_OTHER): Payer: PPO | Admitting: Oncology

## 2017-11-25 ENCOUNTER — Inpatient Hospital Stay: Payer: PPO | Attending: Oncology

## 2017-11-25 VITALS — BP 140/60 | HR 61 | Temp 98.2°F | Resp 17 | Ht 61.0 in | Wt 137.5 lb

## 2017-11-25 DIAGNOSIS — Z7982 Long term (current) use of aspirin: Secondary | ICD-10-CM | POA: Insufficient documentation

## 2017-11-25 DIAGNOSIS — Z79899 Other long term (current) drug therapy: Secondary | ICD-10-CM | POA: Insufficient documentation

## 2017-11-25 DIAGNOSIS — C569 Malignant neoplasm of unspecified ovary: Secondary | ICD-10-CM | POA: Insufficient documentation

## 2017-11-25 DIAGNOSIS — Z5112 Encounter for antineoplastic immunotherapy: Secondary | ICD-10-CM | POA: Insufficient documentation

## 2017-11-25 DIAGNOSIS — D6959 Other secondary thrombocytopenia: Secondary | ICD-10-CM | POA: Insufficient documentation

## 2017-11-25 DIAGNOSIS — Z5189 Encounter for other specified aftercare: Secondary | ICD-10-CM | POA: Insufficient documentation

## 2017-11-25 DIAGNOSIS — D649 Anemia, unspecified: Secondary | ICD-10-CM | POA: Diagnosis not present

## 2017-11-25 DIAGNOSIS — C786 Secondary malignant neoplasm of retroperitoneum and peritoneum: Secondary | ICD-10-CM | POA: Insufficient documentation

## 2017-11-25 DIAGNOSIS — C482 Malignant neoplasm of peritoneum, unspecified: Secondary | ICD-10-CM

## 2017-11-25 DIAGNOSIS — Z95828 Presence of other vascular implants and grafts: Secondary | ICD-10-CM

## 2017-11-25 DIAGNOSIS — D701 Agranulocytosis secondary to cancer chemotherapy: Secondary | ICD-10-CM

## 2017-11-25 DIAGNOSIS — Z853 Personal history of malignant neoplasm of breast: Secondary | ICD-10-CM | POA: Insufficient documentation

## 2017-11-25 DIAGNOSIS — R110 Nausea: Secondary | ICD-10-CM | POA: Diagnosis not present

## 2017-11-25 DIAGNOSIS — Z5111 Encounter for antineoplastic chemotherapy: Secondary | ICD-10-CM | POA: Insufficient documentation

## 2017-11-25 DIAGNOSIS — C50919 Malignant neoplasm of unspecified site of unspecified female breast: Secondary | ICD-10-CM

## 2017-11-25 LAB — CMP (CANCER CENTER ONLY)
ALK PHOS: 129 U/L — AB (ref 38–126)
ALT: 65 U/L — ABNORMAL HIGH (ref 0–44)
AST: 71 U/L — ABNORMAL HIGH (ref 15–41)
Albumin: 3.4 g/dL — ABNORMAL LOW (ref 3.5–5.0)
Anion gap: 9 (ref 5–15)
BUN: 8 mg/dL (ref 6–20)
CHLORIDE: 107 mmol/L (ref 98–111)
CO2: 27 mmol/L (ref 22–32)
Calcium: 8.8 mg/dL — ABNORMAL LOW (ref 8.9–10.3)
Creatinine: 0.73 mg/dL (ref 0.44–1.00)
Glucose, Bld: 93 mg/dL (ref 70–99)
POTASSIUM: 4.5 mmol/L (ref 3.5–5.1)
SODIUM: 143 mmol/L (ref 135–145)
Total Bilirubin: 0.3 mg/dL (ref 0.3–1.2)
Total Protein: 8 g/dL (ref 6.5–8.1)

## 2017-11-25 LAB — CBC WITH DIFFERENTIAL (CANCER CENTER ONLY)
Basophils Absolute: 0.1 10*3/uL (ref 0.0–0.1)
Basophils Relative: 1 %
Eosinophils Absolute: 0.1 10*3/uL (ref 0.0–0.5)
Eosinophils Relative: 1 %
HEMATOCRIT: 31.4 % — AB (ref 34.8–46.6)
HEMOGLOBIN: 10.2 g/dL — AB (ref 11.6–15.9)
LYMPHS ABS: 1.7 10*3/uL (ref 0.9–3.3)
Lymphocytes Relative: 21 %
MCH: 31.1 pg (ref 25.1–34.0)
MCHC: 32.3 g/dL (ref 31.5–36.0)
MCV: 96.4 fL (ref 79.5–101.0)
Monocytes Absolute: 0.8 10*3/uL (ref 0.1–0.9)
Monocytes Relative: 11 %
NEUTROS PCT: 66 %
Neutro Abs: 5.2 10*3/uL (ref 1.5–6.5)
Platelet Count: 45 10*3/uL — ABNORMAL LOW (ref 145–400)
RBC: 3.26 MIL/uL — AB (ref 3.70–5.45)
RDW: 20.5 % — ABNORMAL HIGH (ref 11.2–14.5)
WBC: 7.9 10*3/uL (ref 3.9–10.3)

## 2017-11-25 LAB — TOTAL PROTEIN, URINE DIPSTICK: Protein, ur: NEGATIVE mg/dL

## 2017-11-25 MED ORDER — SODIUM CHLORIDE 0.9% FLUSH
10.0000 mL | INTRAVENOUS | Status: DC | PRN
  Administered 2017-11-25: 10 mL
  Filled 2017-11-25: qty 10

## 2017-11-25 MED ORDER — PROCHLORPERAZINE MALEATE 10 MG PO TABS
ORAL_TABLET | ORAL | Status: AC
  Filled 2017-11-25: qty 1

## 2017-11-25 MED ORDER — SODIUM CHLORIDE 0.9 % IV SOLN
800.0000 mg/m2 | Freq: Once | INTRAVENOUS | Status: AC
  Administered 2017-11-25: 1330 mg via INTRAVENOUS
  Filled 2017-11-25: qty 34.98

## 2017-11-25 MED ORDER — PROCHLORPERAZINE MALEATE 10 MG PO TABS
10.0000 mg | ORAL_TABLET | Freq: Once | ORAL | Status: AC
  Administered 2017-11-25: 10 mg via ORAL

## 2017-11-25 MED ORDER — SODIUM CHLORIDE 0.9% FLUSH
10.0000 mL | INTRAVENOUS | Status: DC | PRN
  Administered 2017-11-25: 10 mL via INTRAVENOUS
  Filled 2017-11-25: qty 10

## 2017-11-25 MED ORDER — SODIUM CHLORIDE 0.9 % IV SOLN
Freq: Once | INTRAVENOUS | Status: AC
  Administered 2017-11-25: 11:00:00 via INTRAVENOUS
  Filled 2017-11-25: qty 250

## 2017-11-25 MED ORDER — SODIUM CHLORIDE 0.9 % IV SOLN
15.8000 mg/kg | Freq: Once | INTRAVENOUS | Status: AC
  Administered 2017-11-25: 1000 mg via INTRAVENOUS
  Filled 2017-11-25: qty 8

## 2017-11-25 MED ORDER — HEPARIN SOD (PORK) LOCK FLUSH 100 UNIT/ML IV SOLN
500.0000 [IU] | Freq: Once | INTRAVENOUS | Status: AC | PRN
  Administered 2017-11-25: 500 [IU]
  Filled 2017-11-25: qty 5

## 2017-11-25 NOTE — Telephone Encounter (Signed)
Patient has MyChart and requested no print out. Per 10/7 los patient treatments will be every 3 weeks and 10/14 and 11/4 appointments canceled and verified by [facility]. per 10/7 los

## 2017-11-25 NOTE — Telephone Encounter (Signed)
Spoke to the pt.  She was seen at [place].  Was not admitted.  States she is feeling much better today and on her way to chemo.  However, she was concerned about her platelets.  Low at 14.  Last was 58.  She states that she may not be able to have chemo today.  Informed her that I would send this to Dr. Volanda Napoleon.  I will call her if Dr. Volanda Napoleon needs to see her or has further recommendations.

## 2017-11-25 NOTE — Addendum Note (Signed)
Addended by: Wyatt Portela on: 11/25/2017 11:16 AM   Modules accepted: Orders

## 2017-11-25 NOTE — Progress Notes (Signed)
Hematology and Oncology Follow Up Visit  [name] [id number] 1956-01-28 62 y.o. 11/25/2017 9:33 AM    Principle Diagnosis: 62 year old woman with:  1. 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    2.  Breast cancer that is ER, PR positive HER-2 negative diagnosed in 2017.  She was found to have left-sided ductal carcinoma and positive sentinel lymph node biopsy.   Prior Therapy:  Carboplatin and Taxotere started on 10/01/2012. Avastin was added with cycle 4. Chemotherapy only discontinued in June of 2015.  She is S/P Avastin maintenance only between June 2015 till March 2017. Therapy discontinued due to progression of disease.  She is status post Doxil salvage chemotherapy therapy discontinued because of hypersensitivity reaction in March 2017.  Carboplatin and AUC of 5 started on 07/08/2015.  Therapy discontinued in August 2019 after progression of disease.   Current therapy:   Gemcitabine will be given on day 1 and day 8 of a 21-day cycle with Avastin will be given once every 3 weeks.  Interim History: [name] returns today for a return visit.  She completed the first cycle of gemcitabine and a Avastin without any complications.  She was seen in the emergency department on 11/22/2017 for complaints of cough and chest pain.  CT scan of the chest obtained and did not show any acute abnormalities.  Her platelet count was noted to be 14,000 without any active bleeding.  She did not receive any transfusion.  Her cough has improved at this time and was diagnosed with viral bronchitis.  She denies any ecchymosis or petechiae.  She did have one episode of epistaxis after nasal congestion.  She does not report any headaches, blurry vision, or seizures.  She denies any mental lethargy or dizziness.  She does not report any chest pain, palpitation, orthopnea or leg edema.  Has not reported any shortness of breath or cough. She does not report any nausea,  vomiting.  She denies any change in bowel habits.  She does not report any hematuria or dysuria.  She does not report any joint pain or deformity.  She does not report any bleeding or clotting tendencies.  She denied any mood changes.  Does not report any bleeding or clotting tendency.  Remainder of the review of systems is negative.   Medications: I have reviewed the patient's current medications.  Current Outpatient Medications  Medication Sig Dispense Refill  . acetaminophen (TYLENOL) 325 MG tablet Take 2 tablets (650 mg total) by mouth every 4 (four) hours as needed for mild pain, fever or headache. (Patient not taking: Reported on 11/22/2017) 30 tablet 0  . acetaminophen (TYLENOL) 500 MG tablet Take 500 mg by mouth every 6 (six) hours as needed for moderate pain.    . Acetylcarnitine HCl (ACETYL L-CARNITINE PO) Take 2,000 mg by mouth at bedtime.    . ALPRAZolam (XANAX) 0.5 MG tablet Take 0.5 mg by mouth 2 (two) times daily as needed for anxiety.    Marland Kitchen aspirin 81 MG chewable tablet Chew 1 tablet (81 mg total) by mouth daily. 30 tablet 0  . atorvastatin (LIPITOR) 40 MG tablet Take 1 tablet (40 mg total) by mouth daily. 90 tablet 2  . benzonatate (TESSALON) 200 MG capsule Take 1 capsule (200 mg total) by mouth 2 (two) times daily as needed for cough. 20 capsule 0  . carvedilol (COREG) 3.125 MG tablet Take 1/2 tablet by mouth twice daily with meals. (Patient taking differently: Take 0.5 mg by  mouth 2 (two) times daily with a meal. Take 1/2 tablet by mouth twice daily with meals.) 30 tablet 3  . clotrimazole-betamethasone (LOTRISONE) cream Apply 1 application topically 2 (two) times daily. (Patient taking differently: Apply 1 application topically 2 (two) times daily as needed (rash). ) 30 g 1  . divalproex (DEPAKOTE ER) 250 MG 24 hr tablet Take 250 mg by mouth daily.     . divalproex (DEPAKOTE ER) 500 MG 24 hr tablet Take 1,000 mg by mouth every evening.     Marland Kitchen doxycycline (VIBRA-TABS) 100 MG tablet  Take 1 tablet (100 mg total) by mouth 2 (two) times daily for 7 days. 14 tablet 0  . fluticasone (FLONASE) 50 MCG/ACT nasal spray Place 1 spray into both nostrils daily as needed for allergies.   11  . HYDROcodone-homatropine (HYCODAN) 5-1.5 MG/5ML syrup Take 5 mLs by mouth every 8 (eight) hours as needed for cough. 120 mL 0  . ipratropium (ATROVENT) 0.06 % nasal spray Place 2 sprays into both nostrils 4 (four) times daily. (Patient not taking: Reported on 11/22/2017) 15 mL 0  . lamoTRIgine (LAMICTAL) 100 MG tablet Take 100 mg by mouth 2 (two) times daily.    Marland Kitchen levothyroxine (SYNTHROID, LEVOTHROID) 100 MCG tablet Take 1 tablet (100 mcg total) by mouth daily. 90 tablet 1  . lidocaine-prilocaine (EMLA) cream Apply topically as needed. Apply to port with every chemotherapy. 30 g 2  . loperamide (IMODIUM A-D) 2 MG tablet Take 2 mg by mouth as needed for diarrhea or loose stools.    Marland Kitchen loratadine (CLARITIN) 10 MG tablet Take 10 mg by mouth daily as needed for allergies (with chemo).    . nitroGLYCERIN (NITROSTAT) 0.4 MG SL tablet Place 1 tablet (0.4 mg total) under the tongue every 5 (five) minutes as needed for chest pain. 25 tablet 3  . ondansetron (ZOFRAN ODT) 8 MG disintegrating tablet Take 1 tablet (8 mg total) by mouth every 8 (eight) hours as needed for nausea or vomiting. 20 tablet 1  . ondansetron (ZOFRAN) 8 MG tablet TAKE 1 TABLET BY MOUTH EVERY 8 HOURS AS NEEDED FOR NAUSEA AND VOMITING (Patient not taking: Reported on 11/22/2017) 30 tablet 2  . pantoprazole (PROTONIX) 40 MG tablet Take 1 tablet (40 mg total) by mouth daily. 90 tablet 0  . prochlorperazine (COMPAZINE) 10 MG tablet Take 1 tablet (10 mg total) by mouth every 6 (six) hours as needed for nausea or vomiting. 30 tablet 0  . rOPINIRole (REQUIP) 0.5 MG tablet Take 3 tablets (1.5 mg total) by mouth at bedtime. 90 tablet 4  . sertraline (ZOLOFT) 25 MG tablet Take 75 mg by mouth daily.      No current facility-administered medications for  this visit.    Facility-Administered Medications Ordered in Other Visits  Medication Dose Route Frequency Provider Last Rate Last Dose  . heparin lock flush 100 unit/mL  500 Units Intravenous Once Wyatt Portela, MD      . sodium chloride flush (NS) 0.9 % injection 10 mL  10 mL Intravenous PRN Wyatt Portela, MD      . sodium chloride flush (NS) 0.9 % injection 10 mL  10 mL Intravenous PRN Wyatt Portela, MD   10 mL at 11/25/17 0919     Allergies:  Allergies  Allergen Reactions  . Doxil [Doxorubicin Hcl Liposomal] Anaphylaxis    1st Doxil.   . Pollen Extract Other (See Comments)    Pollen and grass causes a lot sneezing  .  Prednisone     "crawled up the wall" per pt. Tolerated flonase and a duo-neb    Past Medical History, Surgical history, Social history, and Family History reviewed and unchanged.    Physical Exam:   Blood pressure 140/60, pulse 61, temperature 98.2 F (36.8 C), temperature source Oral, resp. rate 17, height '5\' 1"'$  (1.549 m), weight 137 lb 8 oz (62.4 kg), SpO2 100 %.    General appearance: Comfortable appearing without any discomfort Head: Normocephalic without any trauma Oropharynx: Mucous membranes are moist and pink without any thrush or ulcers. Eyes: Pupils are equal and round reactive to light. Lymph nodes: No cervical, supraclavicular, inguinal or axillary lymphadenopathy.   Heart:regular rate and rhythm.  S1 and S2 without leg edema. Lung: Clear without any rhonchi or wheezes.  No dullness to percussion. Abdomin: Soft, nontender, nondistended with good bowel sounds.  No hepatosplenomegaly. Musculoskeletal: No joint deformity or effusion.  Full range of motion noted. Neurological: No deficits noted on motor, sensory and deep tendon reflex exam. Skin: No petechial rash or dryness.  No ecchymosis noted. Psychiatric: Mood and affect appeared appropriate.         Lab Results: Lab Results  Component Value Date   WBC 9.8 11/22/2017   HGB  10.1 (L) 11/22/2017   HCT 32.8 (L) 11/22/2017   MCV 100.0 11/22/2017   PLT 14 (LL) 11/22/2017     Chemistry      Component Value Date/Time   NA 146 (H) 11/22/2017 1800   NA 142 01/21/2017 0822   K 4.9 11/22/2017 1800   K 4.1 01/21/2017 0822   CL 110 11/22/2017 1800   CO2 28 11/22/2017 1800   CO2 27 01/21/2017 0822   BUN 9 11/22/2017 1800   BUN 12.2 01/21/2017 0822   CREATININE 0.80 11/22/2017 1800   CREATININE 0.74 11/11/2017 1111   CREATININE 0.7 01/21/2017 0822      Component Value Date/Time   CALCIUM 9.2 11/22/2017 1800   CALCIUM 8.9 01/21/2017 0822   ALKPHOS 90 11/11/2017 1111   ALKPHOS 75 01/21/2017 0822   AST 42 (H) 11/11/2017 1111   AST 12 01/21/2017 0822   ALT 21 11/11/2017 1111   ALT 7 01/21/2017 0822   BILITOT 0.3 11/11/2017 1111   BILITOT 0.23 01/21/2017 0822           Impression and Plan:  62 year old woman with:  1.  Peritoneal carcinomatosis arising from main ovarian primary diagnosed in 2014.   She is currently receiving therapy utilizing gemcitabine and a Avastin without any complications.  Risks and benefits of continuing chemotherapy was reviewed today she is agreeable to continue.  She will receive gemcitabine at a reduced dose once every 3 weeks.  2. IV access: No complications related to Port-A-Cath access at this time.  This will continue to be in use for chemotherapy.  3. Thrombocytopenia: Related to chemotherapy.  Her gemcitabine schedule will be adjusted accordingly.  She will receive reduced dose once every 3 weeks.  Her platelet count has improved at this time without any need for transfusion.  4. Left breast cancer: No evidence of relapse noted on her recent imaging studies.   5. Neutropenia: She will receive growth factor support after each cycle of therapy.  6.  Anemia: Hemoglobin improved after transfusion without any need for any transfusion.  7.  Bronchitis: Resolving at this time.  8. Followup: She will follow-up in 3  weeks for her next cycle of chemotherapy.  25  minutes was spent with  the patient face-to-face today.  More than 50% of time was dedicated to discussing the natural course of her disease, reviewing treatment options and coordinating her plan of care.   Zola Button, MD 10/7/20199:33 AM

## 2017-11-25 NOTE — Patient Instructions (Signed)
[facility] Discharge Instructions for Patients Receiving Chemotherapy  Today you received the following chemotherapy agents: Gemzar and Avastin.   To help prevent nausea and vomiting after your treatment, we encourage you to take your nausea medication as directed.   If you develop nausea and vomiting that is not controlled by your nausea medication, call the clinic.   BELOW ARE SYMPTOMS THAT SHOULD BE REPORTED IMMEDIATELY:  *FEVER GREATER THAN 100.5 F  *CHILLS WITH OR WITHOUT FEVER  NAUSEA AND VOMITING THAT IS NOT CONTROLLED WITH YOUR NAUSEA MEDICATION  *UNUSUAL SHORTNESS OF BREATH  *UNUSUAL BRUISING OR BLEEDING  TENDERNESS IN MOUTH AND THROAT WITH OR WITHOUT PRESENCE OF ULCERS  *URINARY PROBLEMS  *BOWEL PROBLEMS  UNUSUAL RASH Items with * indicate a potential emergency and should be followed up as soon as possible.  Feel free to call the clinic should you have any questions or concerns. The clinic phone number is [phone].  Please show the CHEMO ALERT CARD at check-in to the Emergency Department and triage nurse.   

## 2017-11-26 ENCOUNTER — Inpatient Hospital Stay: Payer: PPO

## 2017-11-26 ENCOUNTER — Ambulatory Visit: Payer: PPO

## 2017-11-26 VITALS — BP 110/62 | HR 65 | Temp 98.4°F | Resp 18

## 2017-11-26 DIAGNOSIS — Z5189 Encounter for other specified aftercare: Secondary | ICD-10-CM | POA: Diagnosis not present

## 2017-11-26 DIAGNOSIS — C482 Malignant neoplasm of peritoneum, unspecified: Secondary | ICD-10-CM

## 2017-11-26 LAB — CA 125: Cancer Antigen (CA) 125: 27.8 U/mL (ref 0.0–38.1)

## 2017-11-26 MED ORDER — PEGFILGRASTIM-CBQV 6 MG/0.6ML ~~LOC~~ SOSY
PREFILLED_SYRINGE | SUBCUTANEOUS | Status: AC
  Filled 2017-11-26: qty 0.6

## 2017-11-26 MED ORDER — PEGFILGRASTIM-CBQV 6 MG/0.6ML ~~LOC~~ SOSY
6.0000 mg | PREFILLED_SYRINGE | Freq: Once | SUBCUTANEOUS | Status: AC
  Administered 2017-11-26: 6 mg via SUBCUTANEOUS

## 2017-11-26 NOTE — Telephone Encounter (Signed)
Received Li level from PCP.  Pt isn't on Li.  Result was low.  I called pt about it.  States her PCP ordered b/c pt is having some hearing issues.  Pt to have w/u soon.  I've recommended she talk to Onc about it as some chemo drugs can affect hearing.

## 2017-12-01 DIAGNOSIS — G4733 Obstructive sleep apnea (adult) (pediatric): Secondary | ICD-10-CM | POA: Diagnosis not present

## 2017-12-02 ENCOUNTER — Other Ambulatory Visit: Payer: PPO

## 2017-12-02 ENCOUNTER — Encounter: Payer: Self-pay | Admitting: Physician Assistant

## 2017-12-02 ENCOUNTER — Ambulatory Visit (INDEPENDENT_AMBULATORY_CARE_PROVIDER_SITE_OTHER): Payer: PPO | Admitting: Physician Assistant

## 2017-12-02 ENCOUNTER — Ambulatory Visit: Payer: PPO

## 2017-12-02 DIAGNOSIS — G4700 Insomnia, unspecified: Secondary | ICD-10-CM | POA: Diagnosis not present

## 2017-12-02 DIAGNOSIS — F319 Bipolar disorder, unspecified: Secondary | ICD-10-CM

## 2017-12-02 DIAGNOSIS — F429 Obsessive-compulsive disorder, unspecified: Secondary | ICD-10-CM | POA: Diagnosis not present

## 2017-12-02 NOTE — Progress Notes (Signed)
[facility] Med Check  Patient ID: [name],  MRN: [id number]  PCP: Julie Ruddy, MD  Date of Evaluation: 12/02/2017 Time spent:15 minutes   HISTORY/CURRENT STATUS: HPI patient presents in between visits today, after receiving a letter from her therapist we [name], stating that he would no longer be able to see her as a client.  She states he is been sick since the summer and she has not been seeing anyone.  She is very upset because she has been seeing [name] for 5 years.  She is very worried about his health but also concerned that there may be something that she is done that prompted this termination.  She knows she needs to see another therapist but prefers not to see anyone in the [facility] system simply because of concerns that other people can read her records.  Until she received this letter, she states she was doing pretty well with medications.  She was just seen about 3 weeks ago for routine medication check.  At that point, we were going to spread the visit out to 3 months. She denies any suicidal or homicidal thoughts.  No hallucinations. She has not been sleeping well since she got this letter a few days ago.  She has been more anxious.  The Xanax does help. See PHQ 2 9  Individual Medical History/ Review of Systems: Changes? :No  Allergies: Doxil [doxorubicin hcl liposomal]; Pollen extract; and Prednisone  Current Medications:  Current Outpatient Medications:  .  acetaminophen (TYLENOL) 325 MG tablet, Take 2 tablets (650 mg total) by mouth every 4 (four) hours as needed for mild pain, fever or headache. (Patient not taking: Reported on 11/22/2017), Disp: 30 tablet, Rfl: 0 .  acetaminophen (TYLENOL) 500 MG tablet, Take 500 mg by mouth every 6 (six) hours as needed for moderate pain., Disp: , Rfl:  .  Acetylcarnitine HCl (ACETYL L-CARNITINE PO), Take 2,000 mg by mouth at bedtime., Disp: , Rfl:  .  ALPRAZolam (XANAX) 0.5 MG tablet, Take 0.5 mg by mouth 2 (two) times  daily as needed for anxiety., Disp: , Rfl:  .  aspirin 81 MG chewable tablet, Chew 1 tablet (81 mg total) by mouth daily., Disp: 30 tablet, Rfl: 0 .  atorvastatin (LIPITOR) 40 MG tablet, Take 1 tablet (40 mg total) by mouth daily., Disp: 90 tablet, Rfl: 2 .  benzonatate (TESSALON) 200 MG capsule, Take 1 capsule (200 mg total) by mouth 2 (two) times daily as needed for cough., Disp: 20 capsule, Rfl: 0 .  carvedilol (COREG) 3.125 MG tablet, Take 1/2 tablet by mouth twice daily with meals. (Patient taking differently: Take 0.5 mg by mouth 2 (two) times daily with a meal. Take 1/2 tablet by mouth twice daily with meals.), Disp: 30 tablet, Rfl: 3 .  clotrimazole-betamethasone (LOTRISONE) cream, Apply 1 application topically 2 (two) times daily. (Patient taking differently: Apply 1 application topically 2 (two) times daily as needed (rash). ), Disp: 30 g, Rfl: 1 .  divalproex (DEPAKOTE ER) 250 MG 24 hr tablet, Take 250 mg by mouth daily. , Disp: , Rfl:  .  divalproex (DEPAKOTE ER) 500 MG 24 hr tablet, Take 1,000 mg by mouth every evening. , Disp: , Rfl:  .  fluticasone (FLONASE) 50 MCG/ACT nasal spray, Place 1 spray into both nostrils daily as needed for allergies. , Disp: , Rfl: 11 .  HYDROcodone-homatropine (HYCODAN) 5-1.5 MG/5ML syrup, Take 5 mLs by mouth every 8 (eight) hours as needed for cough., Disp: 120 mL,  Rfl: 0 .  ipratropium (ATROVENT) 0.06 % nasal spray, Place 2 sprays into both nostrils 4 (four) times daily. (Patient not taking: Reported on 11/22/2017), Disp: 15 mL, Rfl: 0 .  lamoTRIgine (LAMICTAL) 100 MG tablet, Take 100 mg by mouth 2 (two) times daily., Disp: , Rfl:  .  levothyroxine (SYNTHROID, LEVOTHROID) 100 MCG tablet, Take 1 tablet (100 mcg total) by mouth daily., Disp: 90 tablet, Rfl: 1 .  lidocaine-prilocaine (EMLA) cream, Apply topically as needed. Apply to port with every chemotherapy., Disp: 30 g, Rfl: 2 .  loperamide (IMODIUM A-D) 2 MG tablet, Take 2 mg by mouth as needed for  diarrhea or loose stools., Disp: , Rfl:  .  loratadine (CLARITIN) 10 MG tablet, Take 10 mg by mouth daily as needed for allergies (with chemo)., Disp: , Rfl:  .  nitroGLYCERIN (NITROSTAT) 0.4 MG SL tablet, Place 1 tablet (0.4 mg total) under the tongue every 5 (five) minutes as needed for chest pain., Disp: 25 tablet, Rfl: 3 .  ondansetron (ZOFRAN ODT) 8 MG disintegrating tablet, Take 1 tablet (8 mg total) by mouth every 8 (eight) hours as needed for nausea or vomiting., Disp: 20 tablet, Rfl: 1 .  ondansetron (ZOFRAN) 8 MG tablet, TAKE 1 TABLET BY MOUTH EVERY 8 HOURS AS NEEDED FOR NAUSEA AND VOMITING (Patient not taking: Reported on 11/22/2017), Disp: 30 tablet, Rfl: 2 .  pantoprazole (PROTONIX) 40 MG tablet, Take 1 tablet (40 mg total) by mouth daily., Disp: 90 tablet, Rfl: 0 .  prochlorperazine (COMPAZINE) 10 MG tablet, Take 1 tablet (10 mg total) by mouth every 6 (six) hours as needed for nausea or vomiting., Disp: 30 tablet, Rfl: 0 .  rOPINIRole (REQUIP) 0.5 MG tablet, Take 3 tablets (1.5 mg total) by mouth at bedtime., Disp: 90 tablet, Rfl: 4 .  sertraline (ZOLOFT) 25 MG tablet, Take 75 mg by mouth daily. , Disp: , Rfl:  No current facility-administered medications for this visit.   Facility-Administered Medications Ordered in Other Visits:  .  heparin lock flush 100 unit/mL, 500 Units, Intravenous, Once, Shadad, Firas N, MD .  sodium chloride flush (NS) 0.9 % injection 10 mL, 10 mL, Intravenous, PRN, Alen Blew, Mathis Dad, MD Medication Side Effects: None  Family Medical/ Social History: Changes? No  MENTAL HEALTH EXAM:  There were no vitals taken for this visit.There is no height or weight on file to calculate BMI.  General Appearance: Well Groomed  Eye Contact:  Good  Speech:  Clear and Coherent  Volume:  Normal  Mood:  Depressed  Affect:  Depressed tearful.  consolable  Thought Process:  Coherent  Orientation:  Full (Time, Place, and Person)  Thought Content: Logical   Suicidal  Thoughts:  No  Homicidal Thoughts:  No  Memory:  Immediate  Judgement:  Good  Insight:  Good  Psychomotor Activity:  Normal  Concentration:  Concentration: Good  Recall:  Good  Fund of Knowledge: Good  Language: Good  Akathisia:  No  AIMS (if indicated): not done  Assets:  Desire for Improvement  ADL's:  Intact  Cognition: WNL  Prognosis:  Good    DIAGNOSES:    ICD-10-CM   1. Bipolar I disorder ([place]) F31.9   2. Obsessive-compulsive disorder, unspecified type F42.9   3. Insomnia, unspecified type G47.00     RECOMMENDATIONS: I tried to console her as best I could.  She has contacted his office and in the letter and on the phone was given recommendations for other counselors.  She does  not know what she is going to do at this point.  I have given her the names of several of our counselors here in the office, but again she is hesitant to use anyone in the [facility] health system.  I reassured her that her records are secure. Continue all current medications.  Have encouraged her to use the Xanax more often if needed.  She has been hesitant to take it but I do think it is necessary at this point.  She understands. Return in 4 weeks.   Donnal Moat, PA-C

## 2017-12-03 ENCOUNTER — Ambulatory Visit: Payer: PPO

## 2017-12-10 ENCOUNTER — Ambulatory Visit: Payer: PPO | Admitting: Physician Assistant

## 2017-12-16 ENCOUNTER — Inpatient Hospital Stay (HOSPITAL_BASED_OUTPATIENT_CLINIC_OR_DEPARTMENT_OTHER): Payer: PPO | Admitting: Oncology

## 2017-12-16 ENCOUNTER — Inpatient Hospital Stay: Payer: PPO

## 2017-12-16 ENCOUNTER — Telehealth: Payer: Self-pay | Admitting: Oncology

## 2017-12-16 VITALS — BP 132/67 | HR 59 | Temp 97.8°F | Resp 17 | Ht 61.0 in | Wt 138.7 lb

## 2017-12-16 DIAGNOSIS — C569 Malignant neoplasm of unspecified ovary: Secondary | ICD-10-CM | POA: Diagnosis not present

## 2017-12-16 DIAGNOSIS — D6959 Other secondary thrombocytopenia: Secondary | ICD-10-CM

## 2017-12-16 DIAGNOSIS — Z853 Personal history of malignant neoplasm of breast: Secondary | ICD-10-CM | POA: Diagnosis not present

## 2017-12-16 DIAGNOSIS — R110 Nausea: Secondary | ICD-10-CM | POA: Diagnosis not present

## 2017-12-16 DIAGNOSIS — D649 Anemia, unspecified: Secondary | ICD-10-CM | POA: Diagnosis not present

## 2017-12-16 DIAGNOSIS — C50919 Malignant neoplasm of unspecified site of unspecified female breast: Secondary | ICD-10-CM

## 2017-12-16 DIAGNOSIS — Z79899 Other long term (current) drug therapy: Secondary | ICD-10-CM

## 2017-12-16 DIAGNOSIS — Z95828 Presence of other vascular implants and grafts: Secondary | ICD-10-CM

## 2017-12-16 DIAGNOSIS — C786 Secondary malignant neoplasm of retroperitoneum and peritoneum: Secondary | ICD-10-CM | POA: Diagnosis not present

## 2017-12-16 DIAGNOSIS — C482 Malignant neoplasm of peritoneum, unspecified: Secondary | ICD-10-CM

## 2017-12-16 DIAGNOSIS — D701 Agranulocytosis secondary to cancer chemotherapy: Secondary | ICD-10-CM | POA: Diagnosis not present

## 2017-12-16 DIAGNOSIS — Z7982 Long term (current) use of aspirin: Secondary | ICD-10-CM | POA: Diagnosis not present

## 2017-12-16 DIAGNOSIS — Z5189 Encounter for other specified aftercare: Secondary | ICD-10-CM | POA: Diagnosis not present

## 2017-12-16 LAB — CMP (CANCER CENTER ONLY)
ALT: 12 U/L (ref 0–44)
AST: 25 U/L (ref 15–41)
Albumin: 3.2 g/dL — ABNORMAL LOW (ref 3.5–5.0)
Alkaline Phosphatase: 113 U/L (ref 38–126)
Anion gap: 6 (ref 5–15)
BUN: 9 mg/dL (ref 6–20)
CALCIUM: 8.6 mg/dL — AB (ref 8.9–10.3)
CO2: 28 mmol/L (ref 22–32)
CREATININE: 0.75 mg/dL (ref 0.44–1.00)
Chloride: 107 mmol/L (ref 98–111)
GFR, Est AFR Am: >60 mL/min (ref >60)
GLUCOSE: 104 mg/dL — AB (ref 70–99)
POTASSIUM: 4.6 mmol/L (ref 3.5–5.1)
Sodium: 141 mmol/L (ref 135–145)
Total Bilirubin: 0.4 mg/dL (ref 0.3–1.2)
Total Protein: 7.4 g/dL (ref 6.5–8.1)

## 2017-12-16 LAB — CBC WITH DIFFERENTIAL (CANCER CENTER ONLY)
Abs Immature Granulocytes: 0.62 10*3/uL — ABNORMAL HIGH (ref 0.00–0.07)
BASOS PCT: 1 %
Basophils Absolute: 0.1 10*3/uL (ref 0.0–0.1)
EOS PCT: 0 %
Eosinophils Absolute: 0 10*3/uL (ref 0.0–0.5)
HEMATOCRIT: 33.1 % — AB (ref 36.0–46.0)
HEMOGLOBIN: 9.9 g/dL — AB (ref 12.0–15.0)
Immature Granulocytes: 8 %
Lymphocytes Relative: 21 %
Lymphs Abs: 1.6 10*3/uL (ref 0.7–4.0)
MCH: 32.5 pg (ref 26.0–34.0)
MCHC: 29.9 g/dL — AB (ref 30.0–36.0)
MCV: 108.5 fL — ABNORMAL HIGH (ref 80.0–100.0)
MONO ABS: 0.8 10*3/uL (ref 0.1–1.0)
MONOS PCT: 10 %
Neutro Abs: 4.6 10*3/uL (ref 1.7–7.7)
Neutrophils Relative %: 60 %
Platelet Count: 65 10*3/uL — ABNORMAL LOW (ref 150–400)
RBC: 3.05 MIL/uL — AB (ref 3.87–5.11)
RDW: 25.1 % — ABNORMAL HIGH (ref 11.5–15.5)
WBC: 7.7 10*3/uL (ref 4.0–10.5)
nRBC: 0.5 % — ABNORMAL HIGH (ref 0.0–0.2)

## 2017-12-16 MED ORDER — SODIUM CHLORIDE 0.9 % IV SOLN
Freq: Once | INTRAVENOUS | Status: AC
  Administered 2017-12-16: 12:00:00 via INTRAVENOUS
  Filled 2017-12-16: qty 250

## 2017-12-16 MED ORDER — SODIUM CHLORIDE 0.9 % IV SOLN
15.8000 mg/kg | Freq: Once | INTRAVENOUS | Status: AC
  Administered 2017-12-16: 1000 mg via INTRAVENOUS
  Filled 2017-12-16: qty 32

## 2017-12-16 MED ORDER — PROCHLORPERAZINE MALEATE 10 MG PO TABS
ORAL_TABLET | ORAL | Status: AC
  Filled 2017-12-16: qty 1

## 2017-12-16 MED ORDER — HEPARIN SOD (PORK) LOCK FLUSH 100 UNIT/ML IV SOLN
500.0000 [IU] | Freq: Once | INTRAVENOUS | Status: AC | PRN
  Administered 2017-12-16: 500 [IU]
  Filled 2017-12-16: qty 5

## 2017-12-16 MED ORDER — PROCHLORPERAZINE MALEATE 10 MG PO TABS
10.0000 mg | ORAL_TABLET | Freq: Once | ORAL | Status: AC
  Administered 2017-12-16: 10 mg via ORAL

## 2017-12-16 MED ORDER — SODIUM CHLORIDE 0.9% FLUSH
10.0000 mL | INTRAVENOUS | Status: DC | PRN
  Administered 2017-12-16: 10 mL
  Filled 2017-12-16: qty 10

## 2017-12-16 MED ORDER — ONDANSETRON 8 MG PO TBDP: 8.0000 mg | ORAL_TABLET | Freq: Three times a day (TID) | ORAL | 1 refills | Status: DC | PRN

## 2017-12-16 MED ORDER — SODIUM CHLORIDE 0.9 % IV SOLN
800.0000 mg/m2 | Freq: Once | INTRAVENOUS | Status: AC
  Administered 2017-12-16: 1330 mg via INTRAVENOUS
  Filled 2017-12-16: qty 34.98

## 2017-12-16 NOTE — Progress Notes (Signed)
Hematology and Oncology Follow Up Visit  [name] [id number] 1956-01-27 62 y.o. 12/16/2017 10:16 AM    Principle Diagnosis: 62 year old woman with:  1. 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  2. Breast cancer diagnosed in 2017.  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   Prior Therapy:   Carboplatin and Taxotere started on 10/01/2012. Avastin was added with cycle 4. Chemotherapy only discontinued in June of 2015.  She is S/P Avastin maintenance only between June 2015 till March 2017. Therapy discontinued due to progression of disease.  She is status post Doxil salvage chemotherapy therapy discontinued because of hypersensitivity reaction in March 2017.  Carboplatin and AUC of 5 started on 07/08/2015.  Therapy discontinued in August 2019 after progression of disease.   Current therapy:   Gemcitabine will be given on day 1 and day 8 of a 21-day cycle with Avastin will be given once every 3 weeks.  Gemcitabine has been given once every 3 weeks because of cytopenias.  Interim History: [name] presents today for repeat evaluation.  Since her last visit, she tolerated the last cycle of chemotherapy better with less nausea and fatigue.  She only received day 1 of gemcitabine which was more tolerable than day 1 and day 8.  She does report some occasional hematochezia associated with her bleeding hemorrhoids but no abdominal pain or hemoptysis.  She does report her nausea but no vomiting.  Her appetite is reasonable and maintained her weight.  She does not report any headaches, blurry vision, or seizures.  She denies any alteration in mental status or confusion.  She does not report any chest pain, palpitation, orthopnea or leg edema.  Has not reported any shortness of breath or cough. She does not report any abdominal distention, early satiety or vomiting.   She denies any constipation or diarrhea.  She does not report any hematuria or dysuria.  She does not report any arthralgias or myalgias..  She does not report any easy bruising or lymphadenopathy.  She denied any anxiety or depression.  Does not report any heat or cold intolerance.  Remainder of the review of systems is negative.   Medications: I have reviewed the patient's current medications.  Current Outpatient Medications  Medication Sig Dispense Refill  . acetaminophen (TYLENOL) 325 MG tablet Take 2 tablets (650 mg total) by mouth every 4 (four) hours as needed for mild pain, fever or headache. (Patient not taking: Reported on 11/22/2017) 30 tablet 0  . acetaminophen (TYLENOL) 500 MG tablet Take 500 mg by mouth every 6 (six) hours as needed for moderate pain.    . Acetylcarnitine HCl (ACETYL L-CARNITINE PO) Take 2,000 mg by mouth at bedtime.    . ALPRAZolam (XANAX) 0.5 MG tablet Take 0.5 mg by mouth 2 (two) times daily as needed for anxiety.    Marland Kitchen aspirin 81 MG chewable tablet Chew 1 tablet (81 mg total) by mouth daily. 30 tablet 0  . atorvastatin (LIPITOR) 40 MG tablet Take 1 tablet (40 mg total) by mouth daily. 90 tablet 2  . benzonatate (TESSALON) 200 MG capsule Take 1 capsule (200 mg total) by mouth 2 (two) times daily as needed for cough. 20 capsule 0  . carvedilol (COREG) 3.125 MG tablet Take 1/2 tablet by mouth twice daily with meals. (Patient taking differently: Take 0.5 mg by mouth 2 (two) times daily with a meal. Take 1/2 tablet  by mouth twice daily with meals.) 30 tablet 3  . clotrimazole-betamethasone (LOTRISONE) cream Apply 1 application topically 2 (two) times daily. (Patient taking differently: Apply 1 application topically 2 (two) times daily as needed (rash). ) 30 g 1  . divalproex (DEPAKOTE ER) 250 MG 24 hr tablet Take 250 mg by mouth daily.     . divalproex (DEPAKOTE ER) 500 MG 24 hr tablet Take 1,000 mg by mouth every evening.     . fluticasone (FLONASE) 50 MCG/ACT nasal spray  Place 1 spray into both nostrils daily as needed for allergies.   11  . HYDROcodone-homatropine (HYCODAN) 5-1.5 MG/5ML syrup Take 5 mLs by mouth every 8 (eight) hours as needed for cough. 120 mL 0  . ipratropium (ATROVENT) 0.06 % nasal spray Place 2 sprays into both nostrils 4 (four) times daily. (Patient not taking: Reported on 11/22/2017) 15 mL 0  . lamoTRIgine (LAMICTAL) 100 MG tablet Take 100 mg by mouth 2 (two) times daily.    Marland Kitchen levothyroxine (SYNTHROID, LEVOTHROID) 100 MCG tablet Take 1 tablet (100 mcg total) by mouth daily. 90 tablet 1  . lidocaine-prilocaine (EMLA) cream Apply topically as needed. Apply to port with every chemotherapy. 30 g 2  . loperamide (IMODIUM A-D) 2 MG tablet Take 2 mg by mouth as needed for diarrhea or loose stools.    Marland Kitchen loratadine (CLARITIN) 10 MG tablet Take 10 mg by mouth daily as needed for allergies (with chemo).    . nitroGLYCERIN (NITROSTAT) 0.4 MG SL tablet Place 1 tablet (0.4 mg total) under the tongue every 5 (five) minutes as needed for chest pain. 25 tablet 3  . ondansetron (ZOFRAN ODT) 8 MG disintegrating tablet Take 1 tablet (8 mg total) by mouth every 8 (eight) hours as needed for nausea or vomiting. 20 tablet 1  . ondansetron (ZOFRAN) 8 MG tablet TAKE 1 TABLET BY MOUTH EVERY 8 HOURS AS NEEDED FOR NAUSEA AND VOMITING (Patient not taking: Reported on 11/22/2017) 30 tablet 2  . pantoprazole (PROTONIX) 40 MG tablet Take 1 tablet (40 mg total) by mouth daily. 90 tablet 0  . prochlorperazine (COMPAZINE) 10 MG tablet Take 1 tablet (10 mg total) by mouth every 6 (six) hours as needed for nausea or vomiting. 30 tablet 0  . rOPINIRole (REQUIP) 0.5 MG tablet Take 3 tablets (1.5 mg total) by mouth at bedtime. 90 tablet 4  . sertraline (ZOLOFT) 25 MG tablet Take 75 mg by mouth daily.      No current facility-administered medications for this visit.    Facility-Administered Medications Ordered in Other Visits  Medication Dose Route Frequency Provider Last Rate Last  Dose  . heparin lock flush 100 unit/mL  500 Units Intravenous Once Wyatt Portela, MD      . sodium chloride flush (NS) 0.9 % injection 10 mL  10 mL Intravenous PRN Wyatt Portela, MD      . sodium chloride flush (NS) 0.9 % injection 10 mL  10 mL Intracatheter PRN Wyatt Portela, MD   10 mL at 12/16/17 1005     Allergies:  Allergies  Allergen Reactions  . Doxil [Doxorubicin Hcl Liposomal] Anaphylaxis    1st Doxil.   . Pollen Extract Other (See Comments)    Pollen and grass causes a lot sneezing  . Prednisone     "crawled up the wall" per pt. Tolerated flonase and a duo-neb    Past Medical History, Surgical history, Social history, and Family History reviewed and unchanged.  Physical Exam:  Blood pressure 132/67, pulse (!) 59, temperature 97.8 F (36.6 C), temperature source Oral, resp. rate 17, height '5\' 1"'$  (1.549 m), weight 138 lb 11.2 oz (62.9 kg), SpO2 100 %.    ECOG 1 General appearance: Alert, awake without any distress. Head: Atraumatic without abnormalities Oropharynx: Without any thrush or ulcers. Eyes: No scleral icterus. Lymph nodes: No lymphadenopathy noted in the cervical, supraclavicular, or axillary nodes Heart:regular rate and rhythm, without any murmurs or gallops.   Lung: Clear to auscultation without any rhonchi, wheezes or dullness to percussion. Abdomin: Soft, nontender without any shifting dullness or ascites. Musculoskeletal: No clubbing or cyanosis. Neurological: No motor or sensory deficits. Skin: No rashes or lesions. Psychiatric: Mood and affect appeared normal.          Lab Results: Lab Results  Component Value Date   WBC 7.9 11/25/2017   HGB 10.2 (L) 11/25/2017   HCT 31.4 (L) 11/25/2017   MCV 96.4 11/25/2017   PLT 45 (L) 11/25/2017     Chemistry      Component Value Date/Time   NA 143 11/25/2017 0853   NA 142 01/21/2017 0822   K 4.5 11/25/2017 0853   K 4.1 01/21/2017 0822   CL 107 11/25/2017 0853   CO2 27 11/25/2017  0853   CO2 27 01/21/2017 0822   BUN 8 11/25/2017 0853   BUN 12.2 01/21/2017 0822   CREATININE 0.73 11/25/2017 0853   CREATININE 0.7 01/21/2017 0822      Component Value Date/Time   CALCIUM 8.8 (L) 11/25/2017 0853   CALCIUM 8.9 01/21/2017 0822   ALKPHOS 129 (H) 11/25/2017 0853   ALKPHOS 75 01/21/2017 0822   AST 71 (H) 11/25/2017 0853   AST 12 01/21/2017 0822   ALT 65 (H) 11/25/2017 0853   ALT 7 01/21/2017 0822   BILITOT 0.3 11/25/2017 0853   BILITOT 0.23 01/21/2017 0822           Impression and Plan:  62 year old woman with:  1.  Peritoneal carcinomatosis diagnosed in 2014 with multiple therapies outlined above.  She remains on gemcitabine and a Avastin with better tolerance at this time.  Her tumor marker did show some mild response at this time and overall therapy has been tolerated.  Risks and benefits of continuing this treatment was discussed today and she is agreeable to continue.  She will receive today day 1 of gemcitabine and a Avastin with growth factor support every 3 weeks.  2. IV access: Port-A-Cath remains in place without any issues.  3. Thrombocytopenia: Improved at this time with dose reduction of gemcitabine..  She does have mild hemorrhoidal bleed but otherwise no active bleeding noted.  4. Left breast cancer: No evidence of progression of disease based on imaging studies and clinical status.   5. Neutropenia: She will receive growth factor support after each cycle of therapy.  No infection noted.  6.  Anemia: Hemoglobin remains stable and does not require any transfusion.  7.  Hemorrhoidal bleed: Improved at this time.  I asked her to monitor closely and will consider GI referral becomes recurrent.  8. Followup: We will be in 3 weeks for repeat chemotherapy.  25  minutes was spent with the patient face-to-face today.  More than 50% of time was dedicated to reviewing the natural course of her disease, treatment options and coordinating plan of  care.  Zola Button, MD 10/28/201910:16 AM

## 2017-12-16 NOTE — Progress Notes (Signed)
Per Dr. Alen Blew, ok to treat with current platelet count.

## 2017-12-16 NOTE — Telephone Encounter (Signed)
Pt declined avs and calendar. Will receive update on mychart.

## 2017-12-17 ENCOUNTER — Inpatient Hospital Stay: Payer: PPO

## 2017-12-17 ENCOUNTER — Ambulatory Visit (INDEPENDENT_AMBULATORY_CARE_PROVIDER_SITE_OTHER): Payer: PPO

## 2017-12-17 VITALS — BP 110/70 | HR 86 | Ht 62.0 in | Wt 140.0 lb

## 2017-12-17 VITALS — BP 122/58 | HR 62 | Temp 98.3°F | Resp 18

## 2017-12-17 DIAGNOSIS — Z5189 Encounter for other specified aftercare: Secondary | ICD-10-CM | POA: Diagnosis not present

## 2017-12-17 DIAGNOSIS — K625 Hemorrhage of anus and rectum: Secondary | ICD-10-CM

## 2017-12-17 DIAGNOSIS — C482 Malignant neoplasm of peritoneum, unspecified: Secondary | ICD-10-CM

## 2017-12-17 DIAGNOSIS — Z0000 Encounter for general adult medical examination without abnormal findings: Secondary | ICD-10-CM | POA: Diagnosis not present

## 2017-12-17 LAB — CA 125: CANCER ANTIGEN (CA) 125: 28 U/mL (ref 0.0–38.1)

## 2017-12-17 MED ORDER — PEGFILGRASTIM-CBQV 6 MG/0.6ML ~~LOC~~ SOSY
PREFILLED_SYRINGE | SUBCUTANEOUS | Status: AC
  Filled 2017-12-17: qty 0.6

## 2017-12-17 MED ORDER — PEGFILGRASTIM-CBQV 6 MG/0.6ML ~~LOC~~ SOSY
6.0000 mg | PREFILLED_SYRINGE | Freq: Once | SUBCUTANEOUS | Status: AC
  Administered 2017-12-17: 6 mg via SUBCUTANEOUS

## 2017-12-17 NOTE — Progress Notes (Addendum)
Subjective:   [name] is a 62 y.o. female who presents for Medicare Annual (Subsequent) preventive examination.  Reports health as fair  on new med q 3 weeks  Primary peritoneal cancer - dx in July 2014 Invasive ductal carcinoma 07/07/2015 currently in treatment q 3 weeks   Has been sleeping a lot when at home Just seen By oncology 10/28   Has been having issues bleeding from rectal  If it continues, may see a GI per re report from oncology  Today she reports States having bright red blood that fills of the commode.  Worse since her last chemo  States this is not getting better; C/o of pain which is new.  Discussion with Dr. Volanda Napoleon Order for state GI referral placed. Apt made for tomorrow with GI at 8:30  The patient understands to go to ER if her bright red bleeding increases or if she feels dizzy etc.   Diet Nauseated with recent chemo;   Exercise- on hold due to recent chemo. Not feeling as well   Health Maintenance Due  Topic Date Due  . OPHTHALMOLOGY EXAM  10/06/1967  . HEMOGLOBIN A1C  07/18/2016  . URINE MICROALBUMIN  04/23/2017   Eye recent exam per her admission        Objective:     Vitals: BP 110/70   Pulse 86   Ht 5\' 2"  (1.575 m)   Wt 140 lb (63.5 kg)   SpO2 93%   BMI 25.61 kg/m   Body mass index is 25.61 kg/m.  Advanced Directives 12/17/2017 11/22/2017 10/29/2017 05/01/2017 12/13/2016 06/06/2016 06/06/2016  Does Patient Have a Medical Advance Directive? Yes No Yes Yes Yes Yes Yes  Type of Advance Directive - - Living will;Healthcare Power of [place];Living will - [place];Living will [place];Living will  Does patient want to make changes to medical advance directive? - - - No - Patient declined - No - Patient declined -  Copy of [place] in Chart? - - Yes Yes - Yes No - copy requested  Would patient like information on creating a medical advance  directive? - - - - - - -    Tobacco Social History   Tobacco Use  Smoking Status Never Smoker  Smokeless Tobacco Never Used     Counseling given: Yes   Clinical Intake:      Past Medical History:  Diagnosis Date  . ADD (attention deficit disorder)   . Allergy    seasonal  . Anxiety   . Bipolar disorder ([place])   . Breast cancer ([place]) 1989   right breast  . Bronchitis 10/26/2017  . Cancer ([place])    Omentum  . Coronary artery disease   . Depression   . Hematochezia   . History of echocardiogram    a. Limited Echo 9/17: EF 60-65%, no RWMA, Gr 1 DD, trivial AI, trivial MR, GLS -12% (likely underestimated), no pericardial effusion  . Hyperlipidemia   . Hypothyroidism   . Maintenance chemotherapy    Pt has chemo every 3 weeks (on Friday)  . NSTEMI (non-ST elevated myocardial infarction) ([place]) 10/26/2015  . Osteopenia 03/2009   t score -2.1 FRAX 4.6/0.4  . Personal history of chemotherapy   . Personal history of radiation therapy   . Premature menopause   . Sleep apnea    mild  . Urinary incontinence    Past Surgical History:  Procedure Laterality Date  . BREAST  BIOPSY Left 07/07/2015   malignant x 2  . BREAST LUMPECTOMY Right 1989  . BREAST SURGERY  1989   RIGHT LUMPECTOMY, RADIATION AND CHEMO  . CARDIAC CATHETERIZATION N/A 10/26/2015   Procedure: Left Heart Cath and Coronary Angiography;  Surgeon: Wellington Hampshire, MD;  Location: [facility];  Service: Cardiovascular;  Laterality: N/A;  . CARDIAC CATHETERIZATION N/A 10/26/2015   Procedure: Coronary Stent Intervention;  Surgeon: Wellington Hampshire, MD;  Location: [facility];  Service: Cardiovascular;  Laterality: N/A;  . CARDIAC CATHETERIZATION N/A 11/08/2015   Procedure: Left Heart Cath and Coronary Angiography;  Surgeon: Jolaine Artist, MD;  Location: [facility];  Service: Cardiovascular;  Laterality: N/A;  . drug eluting stent  10/26/2015  . FOOT SURGERY  2013   BILATERAL   . HYSTEROSCOPY   2011   [place]  . PELVIC LAPAROSCOPY/ Hysteroscopy  1996   Family History  Problem Relation Age of Onset  . Breast cancer Maternal Aunt 29  . Diabetes Maternal Grandmother   . Heart disease Maternal Grandmother   . Heart disease Maternal Grandfather   . Hypertension Paternal Grandfather   . Heart Problems Paternal Grandfather   . Breast cancer Maternal Aunt        dx. early 90s; had negative GT approx 10 years ago  . Breast cancer Maternal Aunt        dx. 57s with recurrence  . Allergies Father   . Heart disease Mother   . Other Mother        hx of hysterectomy   . Liver cancer Cousin 66       +EtOH  . Cancer Cousin        paternal 1st cousin, once-removed dx. NOS cancer (maybe ovarian) in late 30s-40s  . Cancer Cousin        female paternal 2nd cousin d. of NOS cancer in her 20s-early 80s  . Ovarian cancer Neg Hx   . Colon cancer Neg Hx    Social History   Socioeconomic History  . Marital status: Married    Spouse name: [name]  . Number of children: 0  . Years of education: 73  . Highest education level: Not on file  Occupational History  . Occupation: unemployed    Fish farm manager: [employer], serv  Social Needs  . Financial resource strain: Not on file  . Food insecurity:    Worry: Not on file    Inability: Not on file  . Transportation needs:    Medical: Not on file    Non-medical: Not on file  Tobacco Use  . Smoking status: Never Smoker  . Smokeless tobacco: Never Used  Substance and Sexual Activity  . Alcohol use: Yes    Alcohol/week: 0.0 standard drinks    Comment: very rarely - maybe 1 glass wine every 3 mos  . Drug use: No  . Sexual activity: Not Currently    Birth control/protection: Post-menopausal    Comment: 1st intercourse 28 yo-5 partners  Lifestyle  . Physical activity:    Days per week: Not on file    Minutes per session: Not on file  . Stress: Not on file  Relationships  . Social connections:    Talks on phone: Not on file    Gets  together: Not on file    Attends religious service: Not on file    Active member of club or organization: Not on file    Attends meetings of clubs  or organizations: Not on file    Relationship status: Not on file  Other Topics Concern  . Not on file  Social History Narrative   Lives at home with husband.    Caffeine use:  Tea/soda occass    Outpatient Encounter Medications as of 12/17/2017  Medication Sig  . acetaminophen (TYLENOL) 325 MG tablet Take 2 tablets (650 mg total) by mouth every 4 (four) hours as needed for mild pain, fever or headache.  Marland Kitchen acetaminophen (TYLENOL) 500 MG tablet Take 500 mg by mouth every 6 (six) hours as needed for moderate pain.  . Acetylcarnitine HCl (ACETYL L-CARNITINE PO) Take 2,000 mg by mouth at bedtime.  . ALPRAZolam (XANAX) 0.5 MG tablet Take 0.5 mg by mouth 2 (two) times daily as needed for anxiety.  Marland Kitchen atorvastatin (LIPITOR) 40 MG tablet Take 1 tablet (40 mg total) by mouth daily.  . benzonatate (TESSALON) 200 MG capsule Take 1 capsule (200 mg total) by mouth 2 (two) times daily as needed for cough.  . carvedilol (COREG) 3.125 MG tablet Take 1/2 tablet by mouth twice daily with meals. (Patient taking differently: Take 0.5 mg by mouth 2 (two) times daily with a meal. Take 1/2 tablet by mouth twice daily with meals.)  . clotrimazole-betamethasone (LOTRISONE) cream Apply 1 application topically 2 (two) times daily. (Patient taking differently: Apply 1 application topically 2 (two) times daily as needed (rash). )  . divalproex (DEPAKOTE ER) 250 MG 24 hr tablet Take 250 mg by mouth daily.   . divalproex (DEPAKOTE ER) 500 MG 24 hr tablet Take 1,000 mg by mouth every evening.   . fluticasone (FLONASE) 50 MCG/ACT nasal spray Place 1 spray into both nostrils daily as needed for allergies.   Marland Kitchen HYDROcodone-homatropine (HYCODAN) 5-1.5 MG/5ML syrup Take 5 mLs by mouth every 8 (eight) hours as needed for cough.  Marland Kitchen ipratropium (ATROVENT) 0.06 % nasal spray Place 2  sprays into both nostrils 4 (four) times daily.  Marland Kitchen lamoTRIgine (LAMICTAL) 100 MG tablet Take 100 mg by mouth 2 (two) times daily.  Marland Kitchen levothyroxine (SYNTHROID, LEVOTHROID) 100 MCG tablet Take 1 tablet (100 mcg total) by mouth daily.  Marland Kitchen lidocaine-prilocaine (EMLA) cream Apply topically as needed. Apply to port with every chemotherapy.  . loperamide (IMODIUM A-D) 2 MG tablet Take 2 mg by mouth as needed for diarrhea or loose stools.  Marland Kitchen loratadine (CLARITIN) 10 MG tablet Take 10 mg by mouth daily as needed for allergies (with chemo).  . nitroGLYCERIN (NITROSTAT) 0.4 MG SL tablet Place 1 tablet (0.4 mg total) under the tongue every 5 (five) minutes as needed for chest pain.  Marland Kitchen ondansetron (ZOFRAN ODT) 8 MG disintegrating tablet Take 1 tablet (8 mg total) by mouth every 8 (eight) hours as needed for nausea or vomiting.  . ondansetron (ZOFRAN) 8 MG tablet TAKE 1 TABLET BY MOUTH EVERY 8 HOURS AS NEEDED FOR NAUSEA AND VOMITING  . pantoprazole (PROTONIX) 40 MG tablet Take 1 tablet (40 mg total) by mouth daily.  . prochlorperazine (COMPAZINE) 10 MG tablet Take 1 tablet (10 mg total) by mouth every 6 (six) hours as needed for nausea or vomiting.  Marland Kitchen rOPINIRole (REQUIP) 0.5 MG tablet Take 3 tablets (1.5 mg total) by mouth at bedtime.  . sertraline (ZOLOFT) 25 MG tablet Take 75 mg by mouth daily.   Marland Kitchen aspirin 81 MG chewable tablet Chew 1 tablet (81 mg total) by mouth daily. (Patient not taking: Reported on 12/17/2017)   Facility-Administered Encounter Medications as of 12/17/2017  Medication  . heparin lock flush 100 unit/mL  . sodium chloride flush (NS) 0.9 % injection 10 mL    Activities of Daily Living In your present state of health, do you have any difficulty performing the following activities: 12/17/2017  Hearing? N  Vision? N  Difficulty concentrating or making decisions? N  Walking or climbing stairs? N  Dressing or bathing? N  Doing errands, shopping? N  Preparing Food and eating ? N  Using  the Toilet? N  In the past six months, have you accidently leaked urine? N  Do you have problems with loss of bowel control? Y  Comment hx hemorrhoids; bright red bleeding reported   Managing your Medications? N  Managing your Finances? N  Housekeeping or managing your Housekeeping? N  Some recent data might be hidden    Patient Care Team: Billie Ruddy, MD as PCP - General (Family Medicine) Rosamaria Lints, MD (Inactive) as Referring Physician (Neurology) Wyatt Portela, MD as Consulting Physician (Oncology)    Assessment:   This is a routine wellness examination for Braelyn.  Exercise Activities and Dietary recommendations    Goals    . patient     Exercise Started aquatics and keep this up Once a week;     . Patient Stated     Feel better        Fall Risk Fall Risk  12/17/2017 12/13/2016 12/08/2015 12/07/2015 02/18/2014  Falls in the past year? (No Data) No Yes No No  Comment balance has been off  - - - -  Number falls in past yr: - - 1 - -  Injury with Fall? - - Yes - -  Risk Factor Category  - - High Fall Risk - -  Risk for fall due to : - - History of fall(s) - -     Depression Screen PHQ 2/9 Scores 12/17/2017 12/12/2015 12/07/2015  PHQ - 2 Score 0 2 1  PHQ- 9 Score - 4 -  Some encounter information is confidential and restricted. Go to Review Flowsheets activity to see all data.     Cognitive Function MMSE - Mini Mental State Exam 12/13/2016  Not completed: (No Data)     Ad8 score reviewed for issues:  Issues making decisions:  Less interest in hobbies / activities:  Repeats questions, stories (family complaining):  Trouble using ordinary gadgets (microwave, computer, phone):  Forgets the month or year:   Mismanaging finances:   Remembering appts:  Daily problems with thinking and/or memory: Ad8 score is=0        Immunization History  Administered Date(s) Administered  . Influenza,inj,Quad PF,6+ Mos 01/21/2015, 12/09/2015,  12/10/2016  . Pneumococcal Polysaccharide-23 04/23/2016  . Tdap 04/23/2016    Screening Tests Health Maintenance  Topic Date Due  . OPHTHALMOLOGY EXAM  10/06/1965  . HEMOGLOBIN A1C  07/18/2016  . URINE MICROALBUMIN  04/23/2017  . INFLUENZA VACCINE  12/19/2017 (Originally 09/19/2017)  . PAP SMEAR  12/19/2018  . MAMMOGRAM  08/01/2019  . COLONOSCOPY  11/21/2024  . TETANUS/TDAP  04/24/2026  . PNEUMOCOCCAL POLYSACCHARIDE VACCINE AGE 54-64 HIGH RISK  Completed  . Hepatitis C Screening  Completed  . HIV Screening  Completed       Plan:      PCP Notes   Health Maintenance Mammogram 07/2017 Flu vaccine postponed today; referred to oncology  Having rectal bleeding per her report   Deferred A1c and Ur microalbumin    Abnormal Screens  Results for Eric Form, Jaycelyn M (  MRN [id number]) as of 12/17/2017 14:23  Ref. Range 11/25/2017 08:53 12/16/2017 09:43  RBC Latest Ref Range: 3.87 - 5.11 MIL/uL 3.26 (L) 3.05 (L)  Hemoglobin Latest Ref Range: 12.0 - 15.0 g/dL 10.2 (L) 9.9 (L)    Referrals  Stat referral to GI   Patient concerns; Has been having issues bleeding from rectal  If it continues, may see a GI per re report from oncology  Today she reports States having bright red blood that fills of the commode.  Worse since her last chemo  States this is not getting better; C/o of pain which is new.  Discussion with Dr. Volanda Napoleon Order for state GI referral placed. Apt made for tomorrow with GI at 8:30  The patient understands to go to ER if her bright red bleeding increases or if she feels dizzy etc.     Nurse Concerns; As noted   Next PCP apt AWV today with no MD visit; Dr. Volanda Napoleon consulted / Wynetta Fines RN       I have personally reviewed and noted the following in the patient's chart:   . Medical and social history . Use of alcohol, tobacco or illicit drugs  . Current medications and supplements . Functional ability and status . Nutritional status . Physical  activity . Advanced directives . List of other physicians . Hospitalizations, surgeries, and ER visits in previous 12 months . Vitals . Screenings to include cognitive, depression, and falls . Referrals and appointments  In addition, I have reviewed and discussed with patient certain preventive protocols, quality metrics, and best practice recommendations. A written personalized care plan for preventive services as well as general preventive health recommendations were provided to patient.     Wynetta Fines, RN  12/17/2017

## 2017-12-17 NOTE — Patient Instructions (Signed)
Pegfilgrastim injection What is this medicine? PEGFILGRASTIM (PEG fil gra stim) is a long-acting granulocyte colony-stimulating factor that stimulates the growth of neutrophils, a type of white blood cell important in the body's fight against infection. It is used to reduce the incidence of fever and infection in patients with certain types of cancer who are receiving chemotherapy that affects the bone marrow, and to increase survival after being exposed to high doses of radiation. This medicine may be used for other purposes; ask your health care provider or pharmacist if you have questions. COMMON BRAND NAME(S): Neulasta What should I tell my health care provider before I take this medicine? They need to know if you have any of these conditions: -kidney disease -latex allergy -ongoing radiation therapy -sickle cell disease -skin reactions to acrylic adhesives (On-Body Injector only) -an unusual or allergic reaction to pegfilgrastim, filgrastim, other medicines, foods, dyes, or preservatives -pregnant or trying to get pregnant -breast-feeding How should I use this medicine? This medicine is for injection under the skin. If you get this medicine at home, you will be taught how to prepare and give the pre-filled syringe or how to use the On-body Injector. Refer to the patient Instructions for Use for detailed instructions. Use exactly as directed. Tell your healthcare provider immediately if you suspect that the On-body Injector may not have performed as intended or if you suspect the use of the On-body Injector resulted in a missed or partial dose. It is important that you put your used needles and syringes in a special sharps container. Do not put them in a trash can. If you do not have a sharps container, call your pharmacist or healthcare provider to get one. Talk to your pediatrician regarding the use of this medicine in children. While this drug may be prescribed for selected conditions,  precautions do apply. Overdosage: If you think you have taken too much of this medicine contact a poison control center or emergency room at once. NOTE: This medicine is only for you. Do not share this medicine with others. What if I miss a dose? It is important not to miss your dose. Call your doctor or health care professional if you miss your dose. If you miss a dose due to an On-body Injector failure or leakage, a new dose should be administered as soon as possible using a single prefilled syringe for manual use. What may interact with this medicine? Interactions have not been studied. Give your health care provider a list of all the medicines, herbs, non-prescription drugs, or dietary supplements you use. Also tell them if you smoke, drink alcohol, or use illegal drugs. Some items may interact with your medicine. This list may not describe all possible interactions. Give your health care provider a list of all the medicines, herbs, non-prescription drugs, or dietary supplements you use. Also tell them if you smoke, drink alcohol, or use illegal drugs. Some items may interact with your medicine. What should I watch for while using this medicine? You may need blood work done while you are taking this medicine. If you are going to need a MRI, CT scan, or other procedure, tell your doctor that you are using this medicine (On-Body Injector only). What side effects may I notice from receiving this medicine? Side effects that you should report to your doctor or health care professional as soon as possible: -allergic reactions like skin rash, itching or hives, swelling of the face, lips, or tongue -dizziness -fever -pain, redness, or irritation at site   where injected -pinpoint red spots on the skin -red or dark-brown urine -shortness of breath or breathing problems -stomach or side pain, or pain at the shoulder -swelling -tiredness -trouble passing urine or change in the amount of urine Side  effects that usually do not require medical attention (report to your doctor or health care professional if they continue or are bothersome): -bone pain -muscle pain This list may not describe all possible side effects. Call your doctor for medical advice about side effects. You may report side effects to FDA at 1-800-FDA-1088. Where should I keep my medicine? Keep out of the reach of children. Store pre-filled syringes in a refrigerator between 2 and 8 degrees C (36 and 46 degrees F). Do not freeze. Keep in carton to protect from light. Throw away this medicine if it is left out of the refrigerator for more than 48 hours. Throw away any unused medicine after the expiration date. NOTE: This sheet is a summary. It may not cover all possible information. If you have questions about this medicine, talk to your doctor, pharmacist, or health care provider.  2018 Elsevier/Gold Standard (2016-02-02 12:58:03)  

## 2017-12-17 NOTE — Patient Instructions (Addendum)
[name] , Thank you for taking time to come for your Medicare Wellness Visit. I appreciate your ongoing commitment to your health goals. Please review the following plan we discussed and let me know if I can assist you in the future.   Ask cancer center if you need flu vaccine   Will see Amy at GI tomorrow at 8:30 and if you note continued bright red bleeding in commode of further pain , you can go to the ER.   These are the goals we discussed: Goals    . patient     Exercise Started aquatics and keep this up Once a week;        This is a list of the screening recommended for you and due dates:  Health Maintenance  Topic Date Due  . Eye exam for diabetics  10/06/2067  . Hemoglobin A1C  07/18/2016  . Urine Protein Check  04/23/2017  . Flu Shot  12/19/2017*  . Pap Smear  12/19/2018  . Mammogram  08/01/2019  . Colon Cancer Screening  11/21/2024  . Tetanus Vaccine  04/24/2026  . Pneumococcal vaccine  Completed  .  Hepatitis C: One time screening is recommended by Center for Disease Control  (CDC) for  adults born from 1945 through 1965.   Completed  . HIV Screening  Completed  *Topic was postponed. The date shown is not the original due date.      Fall Prevention in the Home Falls can cause injuries. They can happen to people of all ages. There are many things you can do to make your home safe and to help prevent falls. What can I do on the outside of my home?  Regularly fix the edges of walkways and driveways and fix any cracks.  Remove anything that might make you trip as you walk through a door, such as a raised step or threshold.  Trim any bushes or trees on the path to your home.  Use bright outdoor lighting.  Clear any walking paths of anything that might make someone trip, such as rocks or tools.  Regularly check to see if handrails are loose or broken. Make sure that both sides of any steps have handrails.  Any raised decks and porches should have  guardrails on the edges.  Have any leaves, snow, or ice cleared regularly.  Use sand or salt on walking paths during winter.  Clean up any spills in your garage right away. This includes oil or grease spills. What can I do in the bathroom?  Use night lights.  Install grab bars by the toilet and in the tub and shower. Do not use towel bars as grab bars.  Use non-skid mats or decals in the tub or shower.  If you need to sit down in the shower, use a plastic, non-slip stool.  Keep the floor dry. Clean up any water that spills on the floor as soon as it happens.  Remove soap buildup in the tub or shower regularly.  Attach bath mats securely with double-sided non-slip rug tape.  Do not have throw rugs and other things on the floor that can make you trip. What can I do in the bedroom?  Use night lights.  Make sure that you have a light by your bed that is easy to reach.  Do not use any sheets or blankets that are too big for your bed. They should not hang down onto the floor.  Have a firm chair that has side   arms. You can use this for support while you get dressed.  Do not have throw rugs and other things on the floor that can make you trip. What can I do in the kitchen?  Clean up any spills right away.  Avoid walking on wet floors.  Keep items that you use a lot in easy-to-reach places.  If you need to reach something above you, use a strong step stool that has a grab bar.  Keep electrical cords out of the way.  Do not use floor polish or wax that makes floors slippery. If you must use wax, use non-skid floor wax.  Do not have throw rugs and other things on the floor that can make you trip. What can I do with my stairs?  Do not leave any items on the stairs.  Make sure that there are handrails on both sides of the stairs and use them. Fix handrails that are broken or loose. Make sure that handrails are as long as the stairways.  Check any carpeting to make sure that  it is firmly attached to the stairs. Fix any carpet that is loose or worn.  Avoid having throw rugs at the top or bottom of the stairs. If you do have throw rugs, attach them to the floor with carpet tape.  Make sure that you have a light switch at the top of the stairs and the bottom of the stairs. If you do not have them, ask someone to add them for you. What else can I do to help prevent falls?  Wear shoes that: ? Do not have high heels. ? Have rubber bottoms. ? Are comfortable and fit you well. ? Are closed at the toe. Do not wear sandals.  If you use a stepladder: ? Make sure that it is fully opened. Do not climb a closed stepladder. ? Make sure that both sides of the stepladder are locked into place. ? Ask someone to hold it for you, if possible.  Clearly mark and make sure that you can see: ? Any grab bars or handrails. ? First and last steps. ? Where the edge of each step is.  Use tools that help you move around (mobility aids) if they are needed. These include: ? Canes. ? Walkers. ? Scooters. ? Crutches.  Turn on the lights when you go into a dark area. Replace any light bulbs as soon as they burn out.  Set up your furniture so you have a clear path. Avoid moving your furniture around.  If any of your floors are uneven, fix them.  If there are any pets around you, be aware of where they are.  Review your medicines with your doctor. Some medicines can make you feel dizzy. This can increase your chance of falling. Ask your doctor what other things that you can do to help prevent falls. This information is not intended to replace advice given to you by your health care provider. Make sure you discuss any questions you have with your health care provider. Document Released: 12/02/2008 Document Revised: 07/14/2015 Document Reviewed: 03/12/2014 Elsevier Interactive Patient Education  2018 [employer]  Health Maintenance, Female Adopting a healthy lifestyle and  getting preventive care can go a long way to promote health and wellness. Talk with your health care provider about what schedule of regular examinations is right for you. This is a good chance for you to check in with your provider about disease prevention and staying healthy. In between checkups, there are   plenty of things you can do on your own. Experts have done a lot of research about which lifestyle changes and preventive measures are most likely to keep you healthy. Ask your health care provider for more information. Weight and diet Eat a healthy diet  Be sure to include plenty of vegetables, fruits, low-fat dairy products, and lean protein.  Do not eat a lot of foods high in solid fats, added sugars, or salt.  Get regular exercise. This is one of the most important things you can do for your health. ? Most adults should exercise for at least 150 minutes each week. The exercise should increase your heart rate and make you sweat (moderate-intensity exercise). ? Most adults should also do strengthening exercises at least twice a week. This is in addition to the moderate-intensity exercise.  Maintain a healthy weight  Body mass index (BMI) is a measurement that can be used to identify possible weight problems. It estimates body fat based on height and weight. Your health care provider can help determine your BMI and help you achieve or maintain a healthy weight.  For females 73 years of age and older: ? A BMI below 18.5 is considered underweight. ? A BMI of 18.5 to 24.9 is normal. ? A BMI of 25 to 29.9 is considered overweight. ? A BMI of 30 and above is considered obese.  Watch levels of cholesterol and blood lipids  You should start having your blood tested for lipids and cholesterol at 60 years of age, then have this test every 5 years.  You may need to have your cholesterol levels checked more often if: ? Your lipid or cholesterol levels are high. ? You are older than 60 years of  age. ? You are at high risk for heart disease.  Cancer screening Lung Cancer  Lung cancer screening is recommended for adults 3-23 years old who are at high risk for lung cancer because of a history of smoking.  A yearly low-dose CT scan of the lungs is recommended for people who: ? Currently smoke. ? Have quit within the past 15 years. ? Have at least a 30-pack-year history of smoking. A pack year is smoking an average of one pack of cigarettes a day for 1 year.  Yearly screening should continue until it has been 15 years since you quit.  Yearly screening should stop if you develop a health problem that would prevent you from having lung cancer treatment.  Breast Cancer  Practice breast self-awareness. This means understanding how your breasts normally appear and feel.  It also means doing regular breast self-exams. Let your health care provider know about any changes, no matter how small.  If you are in your 20s or 30s, you should have a clinical breast exam (CBE) by a health care provider every 1-3 years as part of a regular health exam.  If you are 62 or older, have a CBE every year. Also consider having a breast X-ray (mammogram) every year.  If you have a family history of breast cancer, talk to your health care provider about genetic screening.  If you are at high risk for breast cancer, talk to your health care provider about having an MRI and a mammogram every year.  Breast cancer gene (BRCA) assessment is recommended for women who have family members with BRCA-related cancers. BRCA-related cancers include: ? Breast. ? Ovarian. ? Tubal. ? Peritoneal cancers.  Results of the assessment will determine the need for genetic counseling and BRCA1  and BRCA2 testing.  Cervical Cancer Your health care provider may recommend that you be screened regularly for cancer of the pelvic organs (ovaries, uterus, and vagina). This screening involves a pelvic examination, including  checking for microscopic changes to the surface of your cervix (Pap test). You may be encouraged to have this screening done every 3 years, beginning at age 21.  For women ages 30-65, health care providers may recommend pelvic exams and Pap testing every 3 years, or they may recommend the Pap and pelvic exam, combined with testing for human papilloma virus (HPV), every 5 years. Some types of HPV increase your risk of cervical cancer. Testing for HPV may also be done on women of any age with unclear Pap test results.  Other health care providers may not recommend any screening for nonpregnant women who are considered low risk for pelvic cancer and who do not have symptoms. Ask your health care provider if a screening pelvic exam is right for you.  If you have had past treatment for cervical cancer or a condition that could lead to cancer, you need Pap tests and screening for cancer for at least 20 years after your treatment. If Pap tests have been discontinued, your risk factors (such as having a new sexual partner) need to be reassessed to determine if screening should resume. Some women have medical problems that increase the chance of getting cervical cancer. In these cases, your health care provider may recommend more frequent screening and Pap tests.  Colorectal Cancer  This type of cancer can be detected and often prevented.  Routine colorectal cancer screening usually begins at 60 years of age and continues through 60 years of age.  Your health care provider may recommend screening at an earlier age if you have risk factors for colon cancer.  Your health care provider may also recommend using home test kits to check for hidden blood in the stool.  A small camera at the end of a tube can be used to examine your colon directly (sigmoidoscopy or colonoscopy). This is done to check for the earliest forms of colorectal cancer.  Routine screening usually begins at age 50.  Direct examination of  the colon should be repeated every 5-10 years through 60 years of age. However, you may need to be screened more often if early forms of precancerous polyps or small growths are found.  Skin Cancer  Check your skin from head to toe regularly.  Tell your health care provider about any new moles or changes in moles, especially if there is a change in a mole's shape or color.  Also tell your health care provider if you have a mole that is larger than the size of a pencil eraser.  Always use sunscreen. Apply sunscreen liberally and repeatedly throughout the day.  Protect yourself by wearing long sleeves, pants, a wide-brimmed hat, and sunglasses whenever you are outside.  Heart disease, diabetes, and high blood pressure  High blood pressure causes heart disease and increases the risk of stroke. High blood pressure is more likely to develop in: ? People who have blood pressure in the high end of the normal range (130-139/85-89 mm Hg). ? People who are overweight or obese. ? People who are African American.  If you are 18-39 years of age, have your blood pressure checked every 3-5 years. If you are 40 years of age or older, have your blood pressure checked every year. You should have your blood pressure measured twice-once when you   are at a hospital or clinic, and once when you are not at a hospital or clinic. Record the average of the two measurements. To check your blood pressure when you are not at a hospital or clinic, you can use: ? An automated blood pressure machine at a pharmacy. ? A home blood pressure monitor.  If you are between 77 years and 66 years old, ask your health care provider if you should take aspirin to prevent strokes.  Have regular diabetes screenings. This involves taking a blood sample to check your fasting blood sugar level. ? If you are at a normal weight and have a low risk for diabetes, have this test once every three years after 60 years of age. ? If you are  overweight and have a high risk for diabetes, consider being tested at a younger age or more often. Preventing infection Hepatitis B  If you have a higher risk for hepatitis B, you should be screened for this virus. You are considered at high risk for hepatitis B if: ? You were born in a country where hepatitis B is common. Ask your health care provider which countries are considered high risk. ? Your parents were born in a high-risk country, and you have not been immunized against hepatitis B (hepatitis B vaccine). ? You have HIV or AIDS. ? You use needles to inject street drugs. ? You live with someone who has hepatitis B. ? You have had sex with someone who has hepatitis B. ? You get hemodialysis treatment. ? You take certain medicines for conditions, including cancer, organ transplantation, and autoimmune conditions.  Hepatitis C  Blood testing is recommended for: ? Everyone born from [birth year] through 1963. ? Anyone with known risk factors for hepatitis C.  Sexually transmitted infections (STIs)  You should be screened for sexually transmitted infections (STIs) including gonorrhea and chlamydia if: ? You are sexually active and are younger than 60 years of age. ? You are older than 60 years of age and your health care provider tells you that you are at risk for this type of infection. ? Your sexual activity has changed since you were last screened and you are at an increased risk for chlamydia or gonorrhea. Ask your health care provider if you are at risk.  If you do not have HIV, but are at risk, it may be recommended that you take a prescription medicine daily to prevent HIV infection. This is called pre-exposure prophylaxis (PrEP). You are considered at risk if: ? You are sexually active and do not regularly use condoms or know the HIV status of your partner(s). ? You take drugs by injection. ? You are sexually active with a partner who has HIV.  Talk with your health care provider  about whether you are at high risk of being infected with HIV. If you choose to begin PrEP, you should first be tested for HIV. You should then be tested every 3 months for as long as you are taking PrEP. Pregnancy  If you are premenopausal and you may become pregnant, ask your health care provider about preconception counseling.  If you may become pregnant, take 400 to 800 micrograms (mcg) of folic acid every day.  If you want to prevent pregnancy, talk to your health care provider about birth control (contraception). Osteoporosis and menopause  Osteoporosis is a disease in which the bones lose minerals and strength with aging. This can result in serious bone fractures. Your risk for osteoporosis can be identified  using a bone density scan.  If you are 48 years of age or older, or if you are at risk for osteoporosis and fractures, ask your health care provider if you should be screened.  Ask your health care provider whether you should take a calcium or vitamin D supplement to lower your risk for osteoporosis.  Menopause may have certain physical symptoms and risks.  Hormone replacement therapy may reduce some of these symptoms and risks. Talk to your health care provider about whether hormone replacement therapy is right for you. Follow these instructions at home:  Schedule regular health, dental, and eye exams.  Stay current with your immunizations.  Do not use any tobacco products including cigarettes, chewing tobacco, or electronic cigarettes.  If you are pregnant, do not drink alcohol.  If you are breastfeeding, limit how much and how often you drink alcohol.  Limit alcohol intake to no more than 1 drink per day for nonpregnant women. One drink equals 12 ounces of beer, 5 ounces of wine, or 1 ounces of hard liquor.  Do not use street drugs.  Do not share needles.  Ask your health care provider for help if you need support or information about quitting drugs.  Tell your  health care provider if you often feel depressed.  Tell your health care provider if you have ever been abused or do not feel safe at home. This information is not intended to replace advice given to you by your health care provider. Make sure you discuss any questions you have with your health care provider. Document Released: 08/21/2010 Document Revised: 07/14/2015 Document Reviewed: 11/09/2014 Elsevier Interactive Patient Education  [employer].

## 2017-12-18 ENCOUNTER — Ambulatory Visit (INDEPENDENT_AMBULATORY_CARE_PROVIDER_SITE_OTHER): Payer: PPO | Admitting: Physician Assistant

## 2017-12-18 ENCOUNTER — Encounter: Payer: Self-pay | Admitting: Physician Assistant

## 2017-12-18 VITALS — BP 82/60 | HR 87 | Ht 61.0 in | Wt 140.4 lb

## 2017-12-18 DIAGNOSIS — K5903 Drug induced constipation: Secondary | ICD-10-CM | POA: Diagnosis not present

## 2017-12-18 DIAGNOSIS — K625 Hemorrhage of anus and rectum: Secondary | ICD-10-CM

## 2017-12-18 DIAGNOSIS — K6289 Other specified diseases of anus and rectum: Secondary | ICD-10-CM | POA: Diagnosis not present

## 2017-12-18 DIAGNOSIS — K600 Acute anal fissure: Secondary | ICD-10-CM

## 2017-12-18 MED ORDER — AMBULATORY NON FORMULARY MEDICATION: 1 refills | Status: AC

## 2017-12-18 NOTE — Patient Instructions (Signed)
[facility] will call you when the prescription is ready for pick up. I faxed them your insurance card information. The script is for Cardizem gel with Lidocaine.   Start Miralax 17 grams in 8 oz of water daily.  You can also take Senekot or Colace stool sofners as needed.   In no improvement in 2-3 weeks, please call our office and ask for Amy's nurse, Beth. [phone], choose option 2 and ask for Beth, or Natonya Finstad.   Normal BMI (Body Mass Index- based on height and weight) is between 19 and 25. Your BMI today is Body mass index is 26.53 kg/m. Marland Kitchen Please consider follow up  regarding your BMI with your Primary Care Provider.

## 2017-12-19 ENCOUNTER — Encounter: Payer: Self-pay | Admitting: Physician Assistant

## 2017-12-19 DIAGNOSIS — F422 Mixed obsessional thoughts and acts: Secondary | ICD-10-CM | POA: Diagnosis not present

## 2017-12-19 DIAGNOSIS — F319 Bipolar disorder, unspecified: Secondary | ICD-10-CM | POA: Diagnosis not present

## 2017-12-19 NOTE — Progress Notes (Signed)
Subjective:    Patient ID: [name], female    DOB: 04/18/1955, 62 y.o.   MRN: [id number]  HPI [name] is a pleasant 62 year old white female, known to Dr. Havery Moros who was last seen in 2016 when she had colonoscopy.  She comes in today with complaints of rectal bleeding. Patient is currently undergoing ongoing chemotherapy for diagnosis of ovarian CA with carcinomatosis which she has been dealing with since 2014.  She was also diagnosed with breast cancer in 2017 and has undergone treatment.  Her most recent chemotherapy has been complicated by severe thrombocytopenia.  She had a platelet count of 14,000 earlier in October. Other medical problems include coronary artery disease status post MI, GERD with stricture, bipolar disorder Colonoscopy in October 2016 showed moderate diverticulosis of the sigmoid colon and internal hemorrhoids otherwise negative exam. CT of the abdomen and pelvis done August 2019 per Dr. Alen Blew showed new lymph node in the subcarinal and paratracheal area raising question of evolving metastatic disease.  In the abdomen she has had progressive retroperitoneal and right pelvic sidewall lymphadenopathy omental disease appears similar. Patient says she has had some ongoing intermittent rectal bleeding over the past couple of years which she had attributed to hemorrhoids.  With recent change in her chemotherapy agent about a month ago she has developed constipation which was an expected side effect.  This is been difficult to deal with that she has had hard stools and has now had a couple of weeks worth of ongoing rectal pain and she says that she is aware of rectal discomfort constantly but when she has a bowel movement she has severe sharp pain and some bleeding.    Most recent labs on 12/16/2017 hemoglobin 9.9 hematocrit 33.1 and platelets 65,000.  She did have chemo earlier this week.  Review of Systems Pertinent positive and negative review of systems were noted in the  above HPI section.  All other review of systems was otherwise negative.  Outpatient Encounter Medications as of 12/18/2017  Medication Sig  . acetaminophen (TYLENOL) 325 MG tablet Take 2 tablets (650 mg total) by mouth every 4 (four) hours as needed for mild pain, fever or headache.  Marland Kitchen acetaminophen (TYLENOL) 500 MG tablet Take 500 mg by mouth every 6 (six) hours as needed for moderate pain.  . Acetylcarnitine HCl (ACETYL L-CARNITINE PO) Take 2,000 mg by mouth at bedtime.  . ALPRAZolam (XANAX) 0.5 MG tablet Take 0.5 mg by mouth 2 (two) times daily as needed for anxiety.  Marland Kitchen atorvastatin (LIPITOR) 40 MG tablet Take 1 tablet (40 mg total) by mouth daily.  . benzonatate (TESSALON) 200 MG capsule Take 1 capsule (200 mg total) by mouth 2 (two) times daily as needed for cough.  . carvedilol (COREG) 3.125 MG tablet Take 1/2 tablet by mouth twice daily with meals. (Patient taking differently: Take 0.5 mg by mouth 2 (two) times daily with a meal. Take 1/2 tablet by mouth twice daily with meals.)  . clotrimazole-betamethasone (LOTRISONE) cream Apply 1 application topically 2 (two) times daily. (Patient taking differently: Apply 1 application topically 2 (two) times daily as needed (rash). )  . divalproex (DEPAKOTE ER) 250 MG 24 hr tablet Take 250 mg by mouth daily.   . divalproex (DEPAKOTE ER) 500 MG 24 hr tablet Take 1,000 mg by mouth every evening.   . fluticasone (FLONASE) 50 MCG/ACT nasal spray Place 1 spray into both nostrils daily as needed for allergies.   Marland Kitchen HYDROcodone-homatropine (HYCODAN) 5-1.5 MG/5ML syrup Take  5 mLs by mouth every 8 (eight) hours as needed for cough.  Marland Kitchen ipratropium (ATROVENT) 0.06 % nasal spray Place 2 sprays into both nostrils 4 (four) times daily.  Marland Kitchen lamoTRIgine (LAMICTAL) 100 MG tablet Take 100 mg by mouth 2 (two) times daily.  Marland Kitchen levothyroxine (SYNTHROID, LEVOTHROID) 100 MCG tablet Take 1 tablet (100 mcg total) by mouth daily.  Marland Kitchen lidocaine-prilocaine (EMLA) cream Apply  topically as needed. Apply to port with every chemotherapy.  . loperamide (IMODIUM A-D) 2 MG tablet Take 2 mg by mouth as needed for diarrhea or loose stools.  Marland Kitchen loratadine (CLARITIN) 10 MG tablet Take 10 mg by mouth daily as needed for allergies (with chemo).  . nitroGLYCERIN (NITROSTAT) 0.4 MG SL tablet Place 1 tablet (0.4 mg total) under the tongue every 5 (five) minutes as needed for chest pain.  Marland Kitchen ondansetron (ZOFRAN ODT) 8 MG disintegrating tablet Take 1 tablet (8 mg total) by mouth every 8 (eight) hours as needed for nausea or vomiting.  . ondansetron (ZOFRAN) 8 MG tablet TAKE 1 TABLET BY MOUTH EVERY 8 HOURS AS NEEDED FOR NAUSEA AND VOMITING  . pantoprazole (PROTONIX) 40 MG tablet Take 1 tablet (40 mg total) by mouth daily.  . prochlorperazine (COMPAZINE) 10 MG tablet Take 1 tablet (10 mg total) by mouth every 6 (six) hours as needed for nausea or vomiting.  Marland Kitchen rOPINIRole (REQUIP) 0.5 MG tablet Take 3 tablets (1.5 mg total) by mouth at bedtime.  . sertraline (ZOLOFT) 25 MG tablet Take 75 mg by mouth daily.   . AMBULATORY NON FORMULARY MEDICATION Medication Name: Cardizem 2 %/Lidocaine 5% compounded. Apply small amount 1 inch  Inside Anus. 3-4 times daily until symptoms resolve.  . [DISCONTINUED] aspirin 81 MG chewable tablet Chew 1 tablet (81 mg total) by mouth daily. (Patient not taking: Reported on 12/18/2017)   Facility-Administered Encounter Medications as of 12/18/2017  Medication  . heparin lock flush 100 unit/mL  . sodium chloride flush (NS) 0.9 % injection 10 mL   Allergies  Allergen Reactions  . Doxil [Doxorubicin Hcl Liposomal] Anaphylaxis    1st Doxil.   . Pollen Extract Other (See Comments)    Pollen and grass causes a lot sneezing  . Prednisone     "crawled up the wall" per pt. Tolerated flonase and a duo-neb   Patient Active Problem List   Diagnosis Date Noted  . Ovarian cancer ([place]) 10/29/2017  . Need for prophylactic vaccination and inoculation against influenza  12/10/2016  . Encounter for antineoplastic chemotherapy 08/29/2016  . RLS (restless legs syndrome) 04/23/2016  . Nausea and vomiting in adult 02/09/2016  . Cellulitis of right breast 01/04/2016  . GERD (gastroesophageal reflux disease) 12/09/2015  . Presence of drug coated stent in LAD coronary artery 11/08/2015  . 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 Thrombocytopenia ([place]) 11/04/2015  . Coronary artery disease involving native heart without angina pectoris 11/03/2015  . NSTEMI (non-ST elevated myocardial infarction) ([place])   . Pain in the chest 10/25/2015  . Pancytopenia ([place]) 10/25/2015  . Elevated troponin 10/25/2015  . Chest wall pain 07/15/2015  . Genetic testing 07/04/2015  . Port-A-Cath in place 06/16/2015  . Dizziness 03/29/2015  . 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 Malignant neoplasm of female breast ([place]) 09/19/2012  . Lithium use 09/17/2011  . Abnormal coordination 09/17/2011  . Premature menopause   . Depression   . Anxiety   . Bipolar disorder ([place])   . ADD (attention deficit disorder)   . NONSPECIFIC ABNORMAL ELECTROCARDIOGRAM  11/04/2009  . ALLERGIC RHINITIS 09/01/2009  . NONSPEC ELEVATION OF LEVELS OF TRANSAMINASE/LDH 07/28/2008  . MOTOR RESTLESSNESS 02/19/2008  . Internal hemorrhoids without mention of complication 16/11/9602  . ESOPHAGEAL STRICTURE 09/15/2007  . Obstructive sleep apnea 07/21/2007  . Hyperlipidemia 07/07/2007  . Osteopenia 02/20/2007  . Hypothyroidism 10/01/2006  . BREAST CANCER, HX OF 08/12/2006   Social History   Socioeconomic History  . Marital status: Married    Spouse name: [name]  . Number of children: 0  . Years of education: 9  . Highest education level: Not on file  Occupational History  . Occupation: unemployed    Fish farm manager: [employer], [employer]  Social Needs  . Financial resource strain: Not on file  . Food insecurity:    Worry: Not on file    Inability: Not on file  . Transportation needs:     Medical: Not on file    Non-medical: Not on file  Tobacco Use  . Smoking status: Never Smoker  . Smokeless tobacco: Never Used  Substance and Sexual Activity  . Alcohol use: Yes    Alcohol/week: 0.0 standard drinks    Comment: very rarely - maybe 1 glass wine every 3 mos  . Drug use: No  . Sexual activity: Not Currently    Birth control/protection: Post-menopausal    Comment: 1st intercourse 89 yo-5 partners  Lifestyle  . Physical activity:    Days per week: Not on file    Minutes per session: Not on file  . Stress: Not on file  Relationships  . Social connections:    Talks on phone: Not on file    Gets together: Not on file    Attends religious service: Not on file    Active member of club or organization: Not on file    Attends meetings of clubs or organizations: Not on file    Relationship status: Not on file  . Intimate partner violence:    Fear of current or ex partner: Not on file    Emotionally abused: Not on file    Physically abused: Not on file    Forced sexual activity: Not on file  Other Topics Concern  . Not on file  Social History Narrative   Lives at home with husband.    Caffeine use:  Tea/soda occass    [name]'s family history includes Allergies in her father; Breast cancer in her maternal aunt and maternal aunt; Breast cancer (age of onset: 46) in her maternal aunt; Cancer in her cousin and cousin; Diabetes in her maternal grandmother; Heart Problems in her paternal grandfather; Heart disease in her maternal grandfather, maternal grandmother, and mother; Hypertension in her paternal grandfather; Liver cancer (age of onset: 72) in her cousin; Other in her mother.      Objective:    Vitals:   12/18/17 0832  BP: (!) 82/60  Pulse: 87    Physical Exam; well-developed white female in no acute distress, chronically ill-appearing, pleasant blood pressure 82/60 pulse 87, BMI 26.5.  HEENT; nontraumatic normocephalic EOMI PERRLA sclera anicteric, oral  mucosa moist, Cardiovascular; regular rate and rhythm with S1-S2 Pulmonary ;clear bilaterally, Abdomen ;soft, bowel sounds are present, no focal tenderness no palpable mass or hepatosplenomegaly.  Rectal exam; no external lesions noted, exquisitely tender on digital exam at the anus on the left at about 2 o'clock position.  She is unable to tolerate anoscopy to definitely confirm fissure, no blood on the examining glove. Extremities; no clubbing cyanosis or edema skin warm  dry, Neuro psych; alert and oriented, grossly nonfocal mood and affect appropriate       Assessment & Plan:   #65 62 year old white female who presents with 2 to 3-week history of rectal pain, and bright red blood per rectum with bowel movements. On exam she has a anal fissure on the left with exquisite tenderness to exam.  #2 new constipation-/medication/chemo induced #3 ovarian cancer with carcinomatosis with most recent imaging showing progression of retroperitoneal and pelvic disease and new lymph nodes in the chest. #4 breast cancer #5 coronary artery disease status post MI #6 sigmoid diverticulosis and internal hemorrhoids documented at colonoscopy 2016  Plan; start MiraLAX 17 g in 8 ounces of water once daily, may increase to twice daily as needed We also discussed adding a stool softener if needed. Start Cardizem 2%/compounded with 5% lidocaine applied 1 inch inside the anus 3 times daily or anal fissure.  She is to continue until symptoms completely resolve area We also discussed benefit of tub soaks, moisten tissue etc. She is asked to give the above regimen about 2 weeks and if she has not had significant improvement in symptoms and/or if she worsens she will call back for further advice.  Romie Keeble S Stacy Sailer PA-C 12/19/2017   Cc: Billie Ruddy, MD

## 2017-12-20 NOTE — Progress Notes (Signed)
Agree with assessment and plan as outlined.  

## 2017-12-23 ENCOUNTER — Other Ambulatory Visit: Payer: PPO

## 2017-12-23 ENCOUNTER — Ambulatory Visit: Payer: PPO

## 2017-12-24 ENCOUNTER — Encounter: Payer: Self-pay | Admitting: Emergency Medicine

## 2017-12-24 DIAGNOSIS — F429 Obsessive-compulsive disorder, unspecified: Secondary | ICD-10-CM

## 2017-12-24 DIAGNOSIS — G4700 Insomnia, unspecified: Secondary | ICD-10-CM

## 2017-12-27 ENCOUNTER — Encounter: Payer: Self-pay | Admitting: Oncology

## 2017-12-27 NOTE — Progress Notes (Signed)
Medical screening examination/treatment was performed by qualified clinical staff member and as supervising physician I was immediately available for consultation/collaboration. I have reviewed documentation and agree with assessment and plan.  Chandlar Staebell R Apollos Tenbrink, MD  

## 2017-12-30 DIAGNOSIS — F319 Bipolar disorder, unspecified: Secondary | ICD-10-CM | POA: Diagnosis not present

## 2017-12-30 DIAGNOSIS — F422 Mixed obsessional thoughts and acts: Secondary | ICD-10-CM | POA: Diagnosis not present

## 2018-01-01 ENCOUNTER — Encounter: Payer: Self-pay | Admitting: Physician Assistant

## 2018-01-01 ENCOUNTER — Ambulatory Visit (INDEPENDENT_AMBULATORY_CARE_PROVIDER_SITE_OTHER): Payer: PPO | Admitting: Physician Assistant

## 2018-01-01 VITALS — BP 126/80 | HR 56 | Ht 61.0 in | Wt 136.4 lb

## 2018-01-01 DIAGNOSIS — I2510 Atherosclerotic heart disease of native coronary artery without angina pectoris: Secondary | ICD-10-CM

## 2018-01-01 DIAGNOSIS — E785 Hyperlipidemia, unspecified: Secondary | ICD-10-CM | POA: Diagnosis not present

## 2018-01-01 DIAGNOSIS — C482 Malignant neoplasm of peritoneum, unspecified: Secondary | ICD-10-CM | POA: Diagnosis not present

## 2018-01-01 DIAGNOSIS — G4733 Obstructive sleep apnea (adult) (pediatric): Secondary | ICD-10-CM | POA: Diagnosis not present

## 2018-01-01 NOTE — Patient Instructions (Signed)
Medication Instructions:  Your physician recommends that you continue on your current medications as directed. Please refer to the Current Medication list given to you today.  If you need a refill on your cardiac medications before your next appointment, please call your pharmacy.   Lab work: Fasting Lipid to be done at [facility]  If you have labs (blood work) drawn today and your tests are completely normal, you will receive your results only by: Marland Kitchen MyChart Message (if you have MyChart) OR . A paper copy in the mail If you have any lab test that is abnormal or we need to change your treatment, we will call you to review the results.  Testing/Procedures: NONE  Follow-Up: At [facility], you and your health needs are our priority.  As part of our continuing mission to provide you with exceptional heart care, we have created designated Provider Care Teams.  These Care Teams include your primary Cardiologist (physician) and Advanced Practice Providers (APPs -  Physician Assistants and Nurse Practitioners) who all work together to provide you with the care you need, when you need it. You will need a follow up appointment in:  1 years.  Please call our office 2 months in advance to schedule this appointment.  You may see Mertie Moores, MD or one of the following Advanced Practice Providers on your designated Care Team: Richardson Dopp, PA-C [place], [place] . Daune Perch, NP  Any Other Special Instructions Will Be Listed Below (If Applicable). NEXT APPOINTMENT WITH DR. SHADAD, ASK IF IT IS OKAY TO TALK ASPIRIN 81 MG DAILY.

## 2018-01-01 NOTE — Progress Notes (Signed)
Cardiology Office Note:    Date:  01/01/2018   ID:  [name], DOB 04/17/1955, MRN [id number]  PCP:  Billie Ruddy, MD  Cardiologist: will establish with Mertie Moores, MD   Electrophysiologist:  None  Oncologist: Dr. Alen Blew  Referring MD: Billie Ruddy, MD   Chief Complaint  Patient presents with  . Follow-up    CAD    History of Present Illness:    [name] is a 62 y.o. female with oronary artery disease s/p non ST-elevation myocardial infarction in Sept 2017 treated with DES to LAD.  She had reduced LVF at her Cardiac Catheterization with ejection fraction 35-45.  EF improved to 60-65 on follow up echocardiogram.  Repeat cardiac catheterization 2 weeks later demonstrated patent LAD stent and mild to moderate nonobstructive disease elsewhere.  Other history includes invasive ductal carcinoma of the left breast and peritoneal adenocarcinoma of a GYN etiology.  She was last seen in 11/2016.    [name] returns for follow-up.  She is here alone.  Her chemotherapy regimen recently changed.  She has been weak and more nauseated with this.  She did have a trip to the emergency room last month with bronchitis.  She had hemoptysis with this.  Her platelet count was 14,000.  Her aspirin was stopped.  Her platelet count is now back to 65,000.  She has not had any significant chest discomfort or shortness of breath.  She denies PND or lower extremity swelling or syncope.  Prior CV studies:   The following studies were reviewed today:  Cardiac catheterization 11/08/15 LAD distal stent patent LCx mid 30 OM2/OM3 minimal irregularities RCA minimal irregularities, RPDA ostial 60, RPLB2 30 EF 65  Echocardiogram 11/17/15  EF 60-65, normal wall motion, grade 1 diastolic dysfunction, trivial AI/MR  Echo 10/27/15 Mild concentric LVH, EF 60-65%, normal wall motion, grade 1 diastolic dysfunction, mild LAE, trivial TR, PASP 10 mmHg  LHC 10/26/15 LM ok LAD dist 99% LCx mid  30% RCA irregs; ost RPDA 40%, RPLB2 30% EF 35-45% PCI: 2.25 x 15 mm Xience Alpine DES to distal LAD 1. Severe one-vessel coronary artery disease affecting the distal LAD. Relatively small vessel post stenosis. 2. Moderately reduced LV systolic function with an ejection fraction of 35-40% with severe hypokinesis of the mid distal anterior wall and apical area. 3. Moderately elevated left ventricular end-diastolic pressure. 4. Successful angioplasty and drug-eluting stent placement to the distal LAD. Recommendations: Dual antiplatelet therapy for one year or at least 6 months. I started small dose carvedilol. Initiate treatment with an ACE inhibitor before hospital discharge.  Chest CTA 10/25/15 IMPRESSION: 1. No embolus or acute vascular findings identified in the chest. 2. Cirrhosis.  Venous Duplex 10/25/15 Summary: No evidence of deep vein thrombosis involving the left upper Extremity.  Past Medical History:  Diagnosis Date  . ADD (attention deficit disorder)   . Allergy    seasonal  . Anxiety   . Bipolar disorder ([place])   . Breast cancer ([place]) 1989   right breast  . Bronchitis 10/26/2017  . Cancer ([place])    Omentum  . Coronary artery disease   . Depression   . Hematochezia   . History of echocardiogram    a. Limited Echo 9/17: EF 60-65%, no RWMA, Gr 1 DD, trivial AI, trivial MR, GLS -12% (likely underestimated), no pericardial effusion  . Hyperlipidemia   . Hypothyroidism   . Maintenance chemotherapy    Pt has chemo every 3 weeks (on Friday)  .  NSTEMI (non-ST elevated myocardial infarction) ([place]) 10/26/2015  . Osteopenia 03/2009   t score -2.1 FRAX 4.6/0.4  . Personal history of chemotherapy   . Personal history of radiation therapy   . Premature menopause   . Sleep apnea    mild  . Urinary incontinence    Surgical Hx: The patient  has a past surgical history that includes Breast surgery (1989); Hysteroscopy (2011); PELVIC LAPAROSCOPY/ Hysteroscopy (1996); Foot  surgery (2013); Cardiac catheterization (N/A, 10/26/2015); Cardiac catheterization (N/A, 10/26/2015); Cardiac catheterization (N/A, 11/08/2015); drug eluting stent (10/26/2015); Breast biopsy (Left, 07/07/2015); and Breast lumpectomy (Right, 1989).   Current Medications: Current Meds  Medication Sig  . acetaminophen (TYLENOL) 325 MG tablet Take 2 tablets (650 mg total) by mouth every 4 (four) hours as needed for mild pain, fever or headache.  Marland Kitchen acetaminophen (TYLENOL) 500 MG tablet Take 500 mg by mouth every 6 (six) hours as needed for moderate pain.  . Acetylcarnitine HCl (ACETYL L-CARNITINE PO) Take 2,000 mg by mouth at bedtime.  . ALPRAZolam (XANAX) 0.5 MG tablet Take 0.5 mg by mouth 2 (two) times daily as needed for anxiety.  . AMBULATORY NON FORMULARY MEDICATION Medication Name: Cardizem 2 %/Lidocaine 5% compounded. Apply small amount 1 inch  Inside Anus. 3-4 times daily until symptoms resolve.  Marland Kitchen atorvastatin (LIPITOR) 40 MG tablet Take 1 tablet (40 mg total) by mouth daily.  . benzonatate (TESSALON) 200 MG capsule Take 1 capsule (200 mg total) by mouth 2 (two) times daily as needed for cough.  . carvedilol (COREG) 3.125 MG tablet Take 1/2 tablet by mouth twice daily with meals.  . clotrimazole-betamethasone (LOTRISONE) cream Apply 1 application topically 2 (two) times daily.  . divalproex (DEPAKOTE ER) 250 MG 24 hr tablet Take 250 mg by mouth daily.   . divalproex (DEPAKOTE ER) 500 MG 24 hr tablet Take 1,000 mg by mouth every evening.   . fluticasone (FLONASE) 50 MCG/ACT nasal spray Place 1 spray into both nostrils daily as needed for allergies.   Marland Kitchen HYDROcodone-homatropine (HYCODAN) 5-1.5 MG/5ML syrup Take 5 mLs by mouth every 8 (eight) hours as needed for cough.  Marland Kitchen ipratropium (ATROVENT) 0.06 % nasal spray Place 2 sprays into both nostrils 4 (four) times daily.  Marland Kitchen lamoTRIgine (LAMICTAL) 100 MG tablet Take 100 mg by mouth 2 (two) times daily.  Marland Kitchen levothyroxine (SYNTHROID, LEVOTHROID) 100 MCG  tablet Take 1 tablet (100 mcg total) by mouth daily.  Marland Kitchen lidocaine-prilocaine (EMLA) cream Apply topically as needed. Apply to port with every chemotherapy.  . loperamide (IMODIUM A-D) 2 MG tablet Take 2 mg by mouth as needed for diarrhea or loose stools.  Marland Kitchen loratadine (CLARITIN) 10 MG tablet Take 10 mg by mouth daily as needed for allergies (with chemo).  . nitroGLYCERIN (NITROSTAT) 0.4 MG SL tablet Place 1 tablet (0.4 mg total) under the tongue every 5 (five) minutes as needed for chest pain.  Marland Kitchen ondansetron (ZOFRAN ODT) 8 MG disintegrating tablet Take 1 tablet (8 mg total) by mouth every 8 (eight) hours as needed for nausea or vomiting.  . ondansetron (ZOFRAN) 8 MG tablet TAKE 1 TABLET BY MOUTH EVERY 8 HOURS AS NEEDED FOR NAUSEA AND VOMITING  . pantoprazole (PROTONIX) 40 MG tablet Take 1 tablet (40 mg total) by mouth daily.  . prochlorperazine (COMPAZINE) 10 MG tablet Take 1 tablet (10 mg total) by mouth every 6 (six) hours as needed for nausea or vomiting.  Marland Kitchen rOPINIRole (REQUIP) 0.5 MG tablet Take 3 tablets (1.5 mg total) by mouth  at bedtime.  . sertraline (ZOLOFT) 25 MG tablet Take 75 mg by mouth daily.      Allergies:   Doxil [doxorubicin hcl liposomal]; Pollen extract; and Prednisone   Social History   Tobacco Use  . Smoking status: Never Smoker  . Smokeless tobacco: Never Used  Substance Use Topics  . Alcohol use: Yes    Alcohol/week: 0.0 standard drinks    Comment: very rarely - maybe 1 glass wine every 3 mos  . Drug use: No     Family Hx: The patient's family history includes Allergies in her father; Breast cancer in her maternal aunt and maternal aunt; Breast cancer (age of onset: 31) in her maternal aunt; Cancer in her cousin and cousin; Diabetes in her maternal grandmother; Heart Problems in her paternal grandfather; Heart disease in her maternal grandfather, maternal grandmother, and mother; Hypertension in her paternal grandfather; Liver cancer (age of onset: 11) in her  cousin; Other in her mother. There is no history of Ovarian cancer or Colon cancer.  ROS:   Please see the history of present illness.    Review of Systems  Constitution: Positive for decreased appetite and malaise/fatigue.  HENT: Positive for hearing loss.   Eyes: Positive for visual disturbance.  Respiratory: Positive for cough.   Hematologic/Lymphatic: Bruises/bleeds easily.  Musculoskeletal: Positive for back pain.  Gastrointestinal: Positive for constipation, hematochezia and nausea.  Neurological: Positive for dizziness and loss of balance.  Psychiatric/Behavioral: Positive for depression. The patient is nervous/anxious.    All other systems reviewed and are negative.   EKGs/Labs/Other Test Reviewed:    EKG:  EKG is  ordered today.  The ekg ordered today demonstrates sinus bradycardia, heart rate 56, normal axis, nonspecific ST-T wave changes, QTC 422, similar to prior tracing  Recent Labs: 09/20/2017: TSH 2.72 11/22/2017: B Natriuretic Peptide 93.5 12/16/2017: ALT 12; BUN 9; Creatinine 0.75; Hemoglobin 9.9; Platelet Count 65; Potassium 4.6; Sodium 141   Recent Lipid Panel Lab Results  Component Value Date/Time   CHOL 120 04/16/2016 08:06 AM   TRIG 153.0 (H) 04/16/2016 08:06 AM   HDL 34.90 (L) 04/16/2016 08:06 AM   CHOLHDL 3 04/16/2016 08:06 AM   LDLCALC 54 04/16/2016 08:06 AM   LDLDIRECT 151.3 08/24/2009 08:03 AM    Physical Exam:    VS:  BP 126/80   Pulse (!) 56   Ht 5\' 1"  (1.549 m)   Wt 136 lb 6.4 oz (61.9 kg)   BMI 25.77 kg/m     Wt Readings from Last 3 Encounters:  01/01/18 136 lb 6.4 oz (61.9 kg)  12/18/17 140 lb 6.4 oz (63.7 kg)  12/17/17 140 lb (63.5 kg)     Physical Exam  Constitutional: She is oriented to person, place, and time. She appears well-developed and well-nourished. No distress.  HENT:  Head: Normocephalic and atraumatic.  Eyes: No scleral icterus.  Neck: No JVD present.  Cardiovascular: Normal rate and regular rhythm.  No murmur  heard. Pulmonary/Chest: Effort normal. She has no rales.  Abdominal: Soft. She exhibits no distension.  Musculoskeletal: She exhibits no edema.  Neurological: She is alert and oriented to person, place, and time.  Skin: Skin is warm and dry.    ASSESSMENT & PLAN:    Coronary artery disease History of non-ST elevation myocardial infarctions in September 2017 treated with a drug-eluting stent to the LAD.  She denies anginal symptoms.  Her ECG is unchanged.  She was taken off of aspirin last month when she presented with hemoptysis  and a platelet count of 14,000.  Her platelet count has improved since that time.  For secondary prevention I would recommend that she resume aspirin 81 mg daily.  I have asked her to follow-up with her oncologist to make sure that there are no contraindications to her resuming aspirin.  Continue statin, beta-blocker.  At last visit, I had recommended she follow up with Dr. Saunders Revel in the future.  With his transition to [place], I will have her see Dr. Cathie Olden going forward.  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  She remains on chemotherapy.  Hyperlipidemia Recent LFTs normal.  Arrange follow-up lipid panel.  Continue statin.   Dispo:  Return in about 1 year (around 01/02/2019) for Routine Follow Up, w/ Dr. Acie Fredrickson.   Medication Adjustments/Labs and Tests Ordered: Current medicines are reviewed at length with the patient today.  Concerns regarding medicines are outlined above.  Tests Ordered: Orders Placed This Encounter  Procedures  . Lipid panel  . EKG 12-Lead   Medication Changes: No orders of the defined types were placed in this encounter.   Signed, Richardson Dopp, PA-C  01/01/2018 4:53 PM    [facility] [address], [place], [place]  [address] Phone: [phone]; Fax: [phone]

## 2018-01-02 ENCOUNTER — Telehealth: Payer: Self-pay | Admitting: Physician Assistant

## 2018-01-02 NOTE — Telephone Encounter (Signed)
Left message for pt to call office back about restarting ASA.

## 2018-01-02 NOTE — Telephone Encounter (Signed)
Please call Ms. Delk and let her know I reviewed her ASA with Dr. Alen Blew.  He does not have any issues with her restarting it. Please have her start ASA 81 mg Once daily. Richardson Dopp, PA-C    01/02/2018 9:46 AM

## 2018-01-02 NOTE — Telephone Encounter (Signed)
-----   Message from Wyatt Portela, MD sent at 01/02/2018  7:22 AM EST ----- Yes. No issues from my standpoint.  Thanks.  ----- Message ----- From: Sharmon Revere Sent: 01/01/2018   4:54 PM EST To: Wyatt Portela, MD  Dr. Alen Blew - Is it ok for her to resume the ASA 81 mg QD? Thanks [employer]

## 2018-01-03 NOTE — Telephone Encounter (Signed)
Pt notified of recommendations and verbalized understanding  

## 2018-01-03 NOTE — Telephone Encounter (Signed)
Patient returned call

## 2018-01-03 NOTE — Telephone Encounter (Signed)
No answer. LMOM to have pt return call.

## 2018-01-06 ENCOUNTER — Telehealth: Payer: Self-pay | Admitting: Oncology

## 2018-01-06 ENCOUNTER — Inpatient Hospital Stay: Payer: PPO | Attending: Oncology

## 2018-01-06 ENCOUNTER — Inpatient Hospital Stay: Payer: PPO

## 2018-01-06 ENCOUNTER — Inpatient Hospital Stay (HOSPITAL_BASED_OUTPATIENT_CLINIC_OR_DEPARTMENT_OTHER): Payer: PPO | Admitting: Oncology

## 2018-01-06 VITALS — BP 120/80 | HR 60 | Temp 97.8°F | Resp 17 | Ht 61.0 in | Wt 135.0 lb

## 2018-01-06 DIAGNOSIS — C786 Secondary malignant neoplasm of retroperitoneum and peritoneum: Secondary | ICD-10-CM | POA: Insufficient documentation

## 2018-01-06 DIAGNOSIS — C482 Malignant neoplasm of peritoneum, unspecified: Secondary | ICD-10-CM

## 2018-01-06 DIAGNOSIS — C569 Malignant neoplasm of unspecified ovary: Secondary | ICD-10-CM | POA: Diagnosis not present

## 2018-01-06 DIAGNOSIS — Z95828 Presence of other vascular implants and grafts: Secondary | ICD-10-CM

## 2018-01-06 DIAGNOSIS — R531 Weakness: Secondary | ICD-10-CM | POA: Insufficient documentation

## 2018-01-06 DIAGNOSIS — Z170 Estrogen receptor positive status [ER+]: Secondary | ICD-10-CM | POA: Diagnosis not present

## 2018-01-06 DIAGNOSIS — Z23 Encounter for immunization: Secondary | ICD-10-CM | POA: Diagnosis not present

## 2018-01-06 DIAGNOSIS — D649 Anemia, unspecified: Secondary | ICD-10-CM | POA: Insufficient documentation

## 2018-01-06 DIAGNOSIS — R112 Nausea with vomiting, unspecified: Secondary | ICD-10-CM | POA: Diagnosis not present

## 2018-01-06 DIAGNOSIS — Z5112 Encounter for antineoplastic immunotherapy: Secondary | ICD-10-CM | POA: Insufficient documentation

## 2018-01-06 DIAGNOSIS — C50912 Malignant neoplasm of unspecified site of left female breast: Secondary | ICD-10-CM | POA: Insufficient documentation

## 2018-01-06 DIAGNOSIS — Z79899 Other long term (current) drug therapy: Secondary | ICD-10-CM

## 2018-01-06 DIAGNOSIS — C50919 Malignant neoplasm of unspecified site of unspecified female breast: Secondary | ICD-10-CM

## 2018-01-06 DIAGNOSIS — D696 Thrombocytopenia, unspecified: Secondary | ICD-10-CM | POA: Diagnosis not present

## 2018-01-06 LAB — CMP (CANCER CENTER ONLY)
ALBUMIN: 3.2 g/dL — AB (ref 3.5–5.0)
ALT: 11 U/L (ref 0–44)
ANION GAP: 6 (ref 5–15)
AST: 30 U/L (ref 15–41)
Alkaline Phosphatase: 113 U/L (ref 38–126)
BUN: 9 mg/dL (ref 6–20)
CO2: 29 mmol/L (ref 22–32)
Calcium: 8.8 mg/dL — ABNORMAL LOW (ref 8.9–10.3)
Chloride: 107 mmol/L (ref 98–111)
Creatinine: 0.68 mg/dL (ref 0.44–1.00)
GFR, Est AFR Am: >60 mL/min (ref >60)
Glucose, Bld: 84 mg/dL (ref 70–99)
POTASSIUM: 4.3 mmol/L (ref 3.5–5.1)
Sodium: 142 mmol/L (ref 135–145)
TOTAL PROTEIN: 7.6 g/dL (ref 6.5–8.1)
Total Bilirubin: 0.6 mg/dL (ref 0.3–1.2)

## 2018-01-06 LAB — CBC WITH DIFFERENTIAL (CANCER CENTER ONLY)
Abs Immature Granulocytes: 0.71 10*3/uL — ABNORMAL HIGH (ref 0.00–0.07)
BASOS ABS: 0.1 10*3/uL (ref 0.0–0.1)
Basophils Relative: 1 %
EOS ABS: 0.1 10*3/uL (ref 0.0–0.5)
Eosinophils Relative: 1 %
HCT: 31.7 % — ABNORMAL LOW (ref 36.0–46.0)
Hemoglobin: 9.4 g/dL — ABNORMAL LOW (ref 12.0–15.0)
IMMATURE GRANULOCYTES: 9 %
Lymphocytes Relative: 21 %
Lymphs Abs: 1.7 10*3/uL (ref 0.7–4.0)
MCH: 33.5 pg (ref 26.0–34.0)
MCHC: 29.7 g/dL — AB (ref 30.0–36.0)
MCV: 112.8 fL — AB (ref 80.0–100.0)
Monocytes Absolute: 0.7 10*3/uL (ref 0.1–1.0)
Monocytes Relative: 9 %
NEUTROS ABS: 4.7 10*3/uL (ref 1.7–7.7)
NEUTROS PCT: 59 %
NRBC: 0.3 % — AB (ref 0.0–0.2)
Platelet Count: 58 10*3/uL — ABNORMAL LOW (ref 150–400)
RBC: 2.81 MIL/uL — AB (ref 3.87–5.11)
RDW: 25.6 % — AB (ref 11.5–15.5)
WBC Count: 7.9 10*3/uL (ref 4.0–10.5)

## 2018-01-06 MED ORDER — DEXAMETHASONE 4 MG PO TABS: 4.0000 mg | ORAL_TABLET | Freq: Two times a day (BID) | ORAL | 3 refills | Status: AC

## 2018-01-06 MED ORDER — SODIUM CHLORIDE 0.9 % IV SOLN
15.8000 mg/kg | Freq: Once | INTRAVENOUS | Status: AC
  Administered 2018-01-06: 1000 mg via INTRAVENOUS
  Filled 2018-01-06: qty 32

## 2018-01-06 MED ORDER — SODIUM CHLORIDE 0.9% FLUSH
10.0000 mL | INTRAVENOUS | Status: DC | PRN
  Administered 2018-01-06: 10 mL
  Filled 2018-01-06: qty 10

## 2018-01-06 MED ORDER — INFLUENZA VAC SPLIT QUAD 0.5 ML IM SUSY
0.5000 mL | PREFILLED_SYRINGE | Freq: Once | INTRAMUSCULAR | Status: AC
  Administered 2018-01-06: 0.5 mL via INTRAMUSCULAR

## 2018-01-06 MED ORDER — SODIUM CHLORIDE 0.9 % IV SOLN
Freq: Once | INTRAVENOUS | Status: DC
  Filled 2018-01-06: qty 250

## 2018-01-06 MED ORDER — INFLUENZA VAC SPLIT QUAD 0.5 ML IM SUSY
PREFILLED_SYRINGE | INTRAMUSCULAR | Status: AC
  Filled 2018-01-06: qty 0.5

## 2018-01-06 MED ORDER — HEPARIN SOD (PORK) LOCK FLUSH 100 UNIT/ML IV SOLN
500.0000 [IU] | Freq: Once | INTRAVENOUS | Status: AC | PRN
  Administered 2018-01-06: 500 [IU]
  Filled 2018-01-06: qty 5

## 2018-01-06 NOTE — Progress Notes (Signed)
Hematology and Oncology Follow Up Visit  [name] [id number] 04-10-1955 62 y.o. 01/06/2018 11:18 AM    Principle Diagnosis: 62 year old woman with:  1.  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    2.  Left-sided breast cancer diagnosed in 2017.  She was found to have ER, PR positive HER-2.   Prior Therapy:   Carboplatin and Taxotere started on 10/01/2012. Avastin was added with cycle 4. Chemotherapy only discontinued in June of 2015.  She is S/P Avastin maintenance only between June 2015 till March 2017. Therapy discontinued due to progression of disease.  She is status post Doxil salvage chemotherapy therapy discontinued because of hypersensitivity reaction in March 2017.  Carboplatin and AUC of 5 started on 07/08/2015.  Therapy discontinued in August 2019 after progression of disease.   Current therapy:   Gemcitabine will be given on day 1 and day 8 of a 21-day cycle with Avastin will be given once every 3 weeks.  Gemcitabine has been given once every 3 weeks for better tolerance.   Interim History: [name] returns today for a follow-up visit.  Since the last visit, she reports nausea associated with her last chemotherapy cycle.  She reported also decline in her weight and her appetite.  She reports metallic taste associated with this chemotherapy.  She also had some vomiting episodes and of lost 5 pounds.  She is still able to maintain her fluid intake on a regular basis.  She denies any other complaints at this time although she does report overall weak.  She does not report any bleeding complications at this time.  Her performance status remains adequate although may be slightly declined.  She does not report any headaches, blurry vision, or seizures.  She denies any confusion or lethargy.  She does not report any chest pain, palpitation, orthopnea or leg edema.  Has not reported any shortness of  breath or cough.  He denies any changes in bowel habits.  She does not report any hematuria or dysuria.  She does not report any bone pain or pathological fractures.  She does not report any ecchymosis or petechiae.  She denied any mood.  Does not report any skin rashes or lesions.  Remainder of the review of systems is negative.   Medications: I have reviewed the patient's current medications.  Current Outpatient Medications  Medication Sig Dispense Refill  . acetaminophen (TYLENOL) 325 MG tablet Take 2 tablets (650 mg total) by mouth every 4 (four) hours as needed for mild pain, fever or headache. 30 tablet 0  . acetaminophen (TYLENOL) 500 MG tablet Take 500 mg by mouth every 6 (six) hours as needed for moderate pain.    . Acetylcarnitine HCl (ACETYL L-CARNITINE PO) Take 2,000 mg by mouth at bedtime.    . ALPRAZolam (XANAX) 0.5 MG tablet Take 0.5 mg by mouth 2 (two) times daily as needed for anxiety.    . AMBULATORY NON FORMULARY MEDICATION Medication Name: Cardizem 2 %/Lidocaine 5% compounded. Apply small amount 1 inch  Inside Anus. 3-4 times daily until symptoms resolve. 30 g 1  . atorvastatin (LIPITOR) 40 MG tablet Take 1 tablet (40 mg total) by mouth daily. 90 tablet 2  . benzonatate (TESSALON) 200 MG capsule Take 1 capsule (200 mg total) by mouth 2 (two) times daily as needed for cough. 20 capsule 0  . carvedilol (COREG) 3.125 MG tablet Take 1/2 tablet by mouth twice daily with meals. [place]  tablet 3  . clotrimazole-betamethasone (LOTRISONE) cream Apply 1 application topically 2 (two) times daily. 30 g 1  . divalproex (DEPAKOTE ER) 250 MG 24 hr tablet Take 250 mg by mouth daily.     . divalproex (DEPAKOTE ER) 500 MG 24 hr tablet Take 1,000 mg by mouth every evening.     . fluticasone (FLONASE) 50 MCG/ACT nasal spray Place 1 spray into both nostrils daily as needed for allergies.   11  . HYDROcodone-homatropine (HYCODAN) 5-1.5 MG/5ML syrup Take 5 mLs by mouth every 8 (eight) hours as needed for  cough. 120 mL 0  . ipratropium (ATROVENT) 0.06 % nasal spray Place 2 sprays into both nostrils 4 (four) times daily. 15 mL 0  . lamoTRIgine (LAMICTAL) 100 MG tablet Take 100 mg by mouth 2 (two) times daily.    Marland Kitchen levothyroxine (SYNTHROID, LEVOTHROID) 100 MCG tablet Take 1 tablet (100 mcg total) by mouth daily. 90 tablet 1  . lidocaine-prilocaine (EMLA) cream Apply topically as needed. Apply to port with every chemotherapy. 30 g 2  . loperamide (IMODIUM A-D) 2 MG tablet Take 2 mg by mouth as needed for diarrhea or loose stools.    Marland Kitchen loratadine (CLARITIN) 10 MG tablet Take 10 mg by mouth daily as needed for allergies (with chemo).    . nitroGLYCERIN (NITROSTAT) 0.4 MG SL tablet Place 1 tablet (0.4 mg total) under the tongue every 5 (five) minutes as needed for chest pain. 25 tablet 3  . ondansetron (ZOFRAN ODT) 8 MG disintegrating tablet Take 1 tablet (8 mg total) by mouth every 8 (eight) hours as needed for nausea or vomiting. 20 tablet 1  . ondansetron (ZOFRAN) 8 MG tablet TAKE 1 TABLET BY MOUTH EVERY 8 HOURS AS NEEDED FOR NAUSEA AND VOMITING 30 tablet 2  . pantoprazole (PROTONIX) 40 MG tablet Take 1 tablet (40 mg total) by mouth daily. 90 tablet 0  . prochlorperazine (COMPAZINE) 10 MG tablet Take 1 tablet (10 mg total) by mouth every 6 (six) hours as needed for nausea or vomiting. 30 tablet 0  . rOPINIRole (REQUIP) 0.5 MG tablet Take 3 tablets (1.5 mg total) by mouth at bedtime. 90 tablet 4  . sertraline (ZOLOFT) 25 MG tablet Take 75 mg by mouth daily.      No current facility-administered medications for this visit.    Facility-Administered Medications Ordered in Other Visits  Medication Dose Route Frequency Provider Last Rate Last Dose  . heparin lock flush 100 unit/mL  500 Units Intravenous Once Wyatt Portela, MD      . sodium chloride flush (NS) 0.9 % injection 10 mL  10 mL Intravenous PRN Wyatt Portela, MD      . sodium chloride flush (NS) 0.9 % injection 10 mL  10 mL Intracatheter PRN  Wyatt Portela, MD   10 mL at 01/06/18 1108     Allergies:  Allergies  Allergen Reactions  . Doxil [Doxorubicin Hcl Liposomal] Anaphylaxis    1st Doxil.   . Pollen Extract Other (See Comments)    Pollen and grass causes a lot sneezing  . Prednisone     "crawled up the wall" per pt. Tolerated flonase and a duo-neb    Past Medical History, Surgical history, Social history, and Family History reviewed and unchanged.    Physical Exam:  Blood pressure 120/80, pulse 60, temperature 97.8 F (36.6 C), temperature source Oral, resp. rate 17, height 5' 1" (1.549 m), weight 135 lb (61.2 kg), SpO2 100 %.  ECOG 1   General appearance: Comfortable appearing without any discomfort Head: Normocephalic without any trauma Oropharynx: Mucous membranes are moist and pink without any thrush or ulcers. Eyes: Pupils are equal and round reactive to light. Lymph nodes: No cervical, supraclavicular, inguinal or axillary lymphadenopathy.   Heart:regular rate and rhythm.  S1 and S2 without leg edema. Lung: Clear without any rhonchi or wheezes.  No dullness to percussion. Abdomin: Soft, nontender, nondistended with good bowel sounds.  No hepatosplenomegaly. Musculoskeletal: No joint deformity or effusion.  Full range of motion noted. Neurological: No deficits noted on motor, sensory and deep tendon reflex exam. Skin: No petechial rash or dryness.  Appeared moist.            Lab Results: Lab Results  Component Value Date   WBC 7.7 12/16/2017   HGB 9.9 (L) 12/16/2017   HCT 33.1 (L) 12/16/2017   MCV 108.5 (H) 12/16/2017   PLT 65 (L) 12/16/2017     Chemistry      Component Value Date/Time   NA 141 12/16/2017 0943   NA 142 01/21/2017 0822   K 4.6 12/16/2017 0943   K 4.1 01/21/2017 0822   CL 107 12/16/2017 0943   CO2 28 12/16/2017 0943   CO2 27 01/21/2017 0822   BUN 9 12/16/2017 0943   BUN 12.2 01/21/2017 0822   CREATININE 0.75 12/16/2017 0943   CREATININE 0.7 01/21/2017  0822      Component Value Date/Time   CALCIUM 8.6 (L) 12/16/2017 0943   CALCIUM 8.9 01/21/2017 0822   ALKPHOS 113 12/16/2017 0943   ALKPHOS 75 01/21/2017 0822   AST 25 12/16/2017 0943   AST 12 01/21/2017 0822   ALT 12 12/16/2017 0943   ALT 7 01/21/2017 0822   BILITOT 0.4 12/16/2017 0943   BILITOT 0.23 01/21/2017 0822      Results for LEEONA, MCCARDLE (MRN [id number]) as of 01/06/2018 11:12  Ref. Range 11/04/2017 09:39 11/25/2017 08:53 12/16/2017 09:43  Cancer Antigen (CA) 125 Latest Ref Range: 0.0 - 38.1 U/mL 33.2 27.8 28.0       Impression and Plan:  62 year old woman with:  1.  Ovarian cancer presenting with peritoneal carcinomatosis diagnosed in 2014.  She is currently receiving gemcitabine and Avastin with episodic nausea and occasional vomiting.  Risks and benefits of continuing this treatment versus switching to different medication was discussed today.  Given her overall response to therapy with a decline in her Ca125 I recommended continuing this therapy with a stronger antiemetics and repeat CT scan in January 2019.  After discussion today, she is agreeable to continue.  Different salvage therapy may be needed if she continues to be intolerant to this medication.  2. IV access: Port-A-Cath currently in use without any complications.  3. Thrombocytopenia: Platelet count remains maintained on the current regimen.  No active bleeding noted.  4. Left breast cancer: Disease remains localized without any evidence of metastatic spread.  No local therapy has been indicated given her ovarian primary.   5. Neutropenia: She will continue to receive growth factor support subsequent cycles.  6.  Anemia: Hemoglobin remains stable without any need for any transfusion.  7.  Nausea: Continues to have issues with nausea related to this medication.  We will change her premedication schedule utilizing Aloxi and dexamethasone and attempt to improve her symptoms.  I will also give her  oral dexamethasone to take at 4 mg twice a day for 3 days.  8. Followup: We will be in 3 weeks  for cycle of chemotherapy.  25  minutes was spent with the patient face-to-face today.  More than 50% of time was dedicated to the status, treatment options and managing complications related therapy.  Zola Button, MD 11/18/201911:18 AM

## 2018-01-06 NOTE — Telephone Encounter (Signed)
Scheduled Dec 30 and 31st per 11/18 los. Printed calendar and avs.

## 2018-01-06 NOTE — Patient Instructions (Signed)
[facility] Discharge Instructions for Patients Receiving Chemotherapy  Today you received the following chemotherapy agents: Gemzar and Avastin.   To help prevent nausea and vomiting after your treatment, we encourage you to take your nausea medication as directed.   If you develop nausea and vomiting that is not controlled by your nausea medication, call the clinic.   BELOW ARE SYMPTOMS THAT SHOULD BE REPORTED IMMEDIATELY:  *FEVER GREATER THAN 100.5 F  *CHILLS WITH OR WITHOUT FEVER  NAUSEA AND VOMITING THAT IS NOT CONTROLLED WITH YOUR NAUSEA MEDICATION  *UNUSUAL SHORTNESS OF BREATH  *UNUSUAL BRUISING OR BLEEDING  TENDERNESS IN MOUTH AND THROAT WITH OR WITHOUT PRESENCE OF ULCERS  *URINARY PROBLEMS  *BOWEL PROBLEMS  UNUSUAL RASH Items with * indicate a potential emergency and should be followed up as soon as possible.  Feel free to call the clinic should you have any questions or concerns. The clinic phone number is [phone].  Please show the [facility] at check-in to the Emergency Department and triage nurse.

## 2018-01-06 NOTE — Progress Notes (Unsigned)
Dr. Alen Blew not available at time of treatment. Dr. Julien Nordmann gave orders for the pt. To reschedule Gemzar treatment due to plt. Of 70.

## 2018-01-07 ENCOUNTER — Inpatient Hospital Stay: Payer: PPO

## 2018-01-07 ENCOUNTER — Telehealth: Payer: Self-pay

## 2018-01-07 LAB — CA 125: Cancer Antigen (CA) 125: 33.5 U/mL (ref 0.0–38.1)

## 2018-01-07 MED ORDER — PEGFILGRASTIM-CBQV 6 MG/0.6ML ~~LOC~~ SOSY
PREFILLED_SYRINGE | SUBCUTANEOUS | Status: AC
  Filled 2018-01-07: qty 0.6

## 2018-01-07 NOTE — Telephone Encounter (Signed)
Left VM message confirming that she does not need to come today for her injection appointment since she didn't receive the Gemzar the previous treatment day per Dr. Alen Blew.

## 2018-01-10 ENCOUNTER — Other Ambulatory Visit: Payer: Self-pay | Admitting: Internal Medicine

## 2018-01-10 ENCOUNTER — Encounter: Payer: Self-pay | Admitting: Physician Assistant

## 2018-01-10 ENCOUNTER — Ambulatory Visit (INDEPENDENT_AMBULATORY_CARE_PROVIDER_SITE_OTHER): Payer: PPO | Admitting: Physician Assistant

## 2018-01-10 ENCOUNTER — Other Ambulatory Visit: Payer: Self-pay | Admitting: Family Medicine

## 2018-01-10 DIAGNOSIS — F422 Mixed obsessional thoughts and acts: Secondary | ICD-10-CM | POA: Diagnosis not present

## 2018-01-10 DIAGNOSIS — G4700 Insomnia, unspecified: Secondary | ICD-10-CM | POA: Diagnosis not present

## 2018-01-10 DIAGNOSIS — F319 Bipolar disorder, unspecified: Secondary | ICD-10-CM

## 2018-01-10 NOTE — Progress Notes (Signed)
[facility] Check  Patient ID: [name],  MRN: [id number]  PCP: Billie Ruddy, MD  Date of Evaluation: 01/10/2018 Time spent:15 minutes  Chief Complaint:  Chief Complaint    Follow-up      HISTORY/CURRENT STATUS: HPI Here for 1 month med check.  Has been depressed more b/c the situation with her former therapist, will Elwyn Reach, [place].  He had been sick for months and was unable to see his patients, and then a few months ago she got a notification that she would have to find another therapist.  That has really upset her because she is been seeing him for many years and has tried to reach out to his office and it sounds like she is basically been told not to reach out anymore.  She has asked me to try to contact him just so she can get closure in someway.  I have called his office 3 times and left messages without any return call.   Other than that, she is doing "as well as to be expected".  She still getting chemo for the omental and breast cancer.  The mother is making her sicker at this time and she has had a lot more nausea and vomiting.  Her oncologist is going to give her more of a steroid as a drip during chemo and then for 3 days afterwards this next round so hopefully she will not have the nausea and vomiting so bad.  Patient denies increased energy with decreased need for sleep, no increased talkativeness, no racing thoughts, no impulsivity or risky behaviors, no increased spending, no increased libido, no grandiosity.  The OCD is fairly well controlled.  She is not going overboard with handwashing or sanitizing.  She does wear a mask when she is out in public but that is because of her immune system being on chemo.  She still has anxiety but has taken the advised to take the Xanax a little more often when needed.  She states it has been helpful.  Individual Medical History/ Review of Systems: Changes? :No    Past medications for mental health diagnoses  include: Xanax, Lamictal, Depakote, l-carnitine, Latuda, symbyax, Zoloft, Pristiq, Paxil, Sonata, lithium, Lunesta  Allergies: Doxil [doxorubicin hcl liposomal]; Pollen extract; and Prednisone  Current Medications:  Current Outpatient Medications:  .  acetaminophen (TYLENOL) 325 MG tablet, Take 2 tablets (650 mg total) by mouth every 4 (four) hours as needed for mild pain, fever or headache., Disp: 30 tablet, Rfl: 0 .  Acetylcarnitine HCl (ACETYL L-CARNITINE PO), Take 2,000 mg by mouth at bedtime., Disp: , Rfl:  .  ALPRAZolam (XANAX) 0.5 MG tablet, Take 0.5 mg by mouth 2 (two) times daily as needed for anxiety., Disp: , Rfl:  .  AMBULATORY NON FORMULARY MEDICATION, Medication Name: Cardizem 2 %/Lidocaine 5% compounded. Apply small amount 1 inch  Inside Anus. 3-4 times daily until symptoms resolve., Disp: 30 g, Rfl: 1 .  atorvastatin (LIPITOR) 40 MG tablet, Take 1 tablet (40 mg total) by mouth daily., Disp: 90 tablet, Rfl: 2 .  carvedilol (COREG) 3.125 MG tablet, Take 1/2 tablet by mouth twice daily with meals., Disp: 30 tablet, Rfl: 3 .  clotrimazole-betamethasone (LOTRISONE) cream, Apply 1 application topically 2 (two) times daily., Disp: 30 g, Rfl: 1 .  divalproex (DEPAKOTE ER) 250 MG 24 hr tablet, Take 250 mg by mouth daily. , Disp: , Rfl:  .  divalproex (DEPAKOTE ER) 500 MG 24 hr tablet, Take 1,000 mg by mouth every  evening. , Disp: , Rfl:  .  lamoTRIgine (LAMICTAL) 100 MG tablet, Take 100 mg by mouth 2 (two) times daily., Disp: , Rfl:  .  levothyroxine (SYNTHROID, LEVOTHROID) 100 MCG tablet, Take 1 tablet (100 mcg total) by mouth daily., Disp: 90 tablet, Rfl: 1 .  lidocaine-prilocaine (EMLA) cream, Apply topically as needed. Apply to port with every chemotherapy., Disp: 30 g, Rfl: 2 .  loperamide (IMODIUM A-D) 2 MG tablet, Take 2 mg by mouth as needed for diarrhea or loose stools., Disp: , Rfl:  .  loratadine (CLARITIN) 10 MG tablet, Take 10 mg by mouth daily as needed for allergies (with  chemo)., Disp: , Rfl:  .  nitroGLYCERIN (NITROSTAT) 0.4 MG SL tablet, Place 1 tablet (0.4 mg total) under the tongue every 5 (five) minutes as needed for chest pain., Disp: 25 tablet, Rfl: 3 .  ondansetron (ZOFRAN ODT) 8 MG disintegrating tablet, Take 1 tablet (8 mg total) by mouth every 8 (eight) hours as needed for nausea or vomiting., Disp: 20 tablet, Rfl: 1 .  ondansetron (ZOFRAN) 8 MG tablet, TAKE 1 TABLET BY MOUTH EVERY 8 HOURS AS NEEDED FOR NAUSEA AND VOMITING, Disp: 30 tablet, Rfl: 2 .  pantoprazole (PROTONIX) 40 MG tablet, Take 1 tablet (40 mg total) by mouth daily., Disp: 90 tablet, Rfl: 1 .  prochlorperazine (COMPAZINE) 10 MG tablet, Take 1 tablet (10 mg total) by mouth every 6 (six) hours as needed for nausea or vomiting., Disp: 30 tablet, Rfl: 0 .  rOPINIRole (REQUIP) 0.5 MG tablet, Take 3 tablets (1.5 mg total) by mouth at bedtime., Disp: 90 tablet, Rfl: 4 .  acetaminophen (TYLENOL) 500 MG tablet, Take 500 mg by mouth every 6 (six) hours as needed for moderate pain., Disp: , Rfl:  .  sertraline (ZOLOFT) 25 MG tablet, Take 75 mg by mouth daily. , Disp: , Rfl:  No current facility-administered medications for this visit.   Facility-Administered Medications Ordered in Other Visits:  .  0.9 %  sodium chloride infusion, , Intravenous, Once, Shadad, Mathis Dad, MD .  heparin lock flush 100 unit/mL, 500 Units, Intravenous, Once, Shadad, Mathis Dad, MD .  sodium chloride flush (NS) 0.9 % injection 10 mL, 10 mL, Intravenous, PRN, Shadad, Mathis Dad, MD .  sodium chloride flush (NS) 0.9 % injection 10 mL, 10 mL, Intracatheter, PRN, Wyatt Portela, MD, 10 mL at 01/06/18 1349 Medication Side Effects: none  Family Medical/ Social History: Changes? No  MENTAL HEALTH EXAM:  There were no vitals taken for this visit.There is no height or weight on file to calculate BMI.  General Appearance: Casual wearing a mask  Eye Contact:  Good  Speech:  Clear and Coherent  Volume:  Normal  Mood:  Euthymic   Affect:  Appropriate  Thought Process:  Goal Directed  Orientation:  Full (Time, Place, and Person)  Thought Content: Logical   Suicidal Thoughts:  No  Homicidal Thoughts:  No  Memory:  WNL  Judgement:  Good  Insight:  Good  Psychomotor Activity:  Normal  Concentration:  Concentration: Good  Recall:  Good  Fund of Knowledge: Good  Language: Good  Assets:  Desire for Improvement  ADL's:  Intact  Cognition: WNL  Prognosis:  Good    DIAGNOSES:    ICD-10-CM   1. Mixed obsessional thoughts and acts F42.2   2. Bipolar I disorder ([place]) F31.9   3. 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  One concern that we both have his possible mania or whether she could go the opposite way and become zombielike.  The latter has happened in the past when she is been on 100 mg of Zoloft.  If she is more depressed over the next couple of weeks, I recommend that she call and will increase the Zoloft over the phone. Continue all other current medications.  Again, I have encouraged that she take Xanax more often if needed, and especially around the time of the chemotherapy this next round.  Since she will be on steroids, she may become more anxious and needs the Xanax. I will write a letter to [name], her former therapist at [facility] on her behalf, just to let him know that even though she was given other therapist names that she could see, she would have like to have heard from Will, to know that he is okay and that she is not being "dumped from the practice" Return in 4 to 6 weeks or sooner as needed. Donnal Moat, PA-C

## 2018-01-13 ENCOUNTER — Telehealth: Payer: Self-pay | Admitting: Physician Assistant

## 2018-01-13 ENCOUNTER — Encounter: Payer: Self-pay | Admitting: Physician Assistant

## 2018-01-13 NOTE — Telephone Encounter (Signed)
Pt called and said that she doesn't want to give Will any problems so she wants to know how to stop being a patient. Please give her a call back.

## 2018-01-13 NOTE — Telephone Encounter (Signed)
I spoke with patient and took care of above.

## 2018-01-14 ENCOUNTER — Encounter: Payer: Self-pay | Admitting: Physician Assistant

## 2018-01-19 DIAGNOSIS — G4733 Obstructive sleep apnea (adult) (pediatric): Secondary | ICD-10-CM | POA: Diagnosis not present

## 2018-01-27 ENCOUNTER — Inpatient Hospital Stay (HOSPITAL_BASED_OUTPATIENT_CLINIC_OR_DEPARTMENT_OTHER): Payer: PPO | Admitting: Oncology

## 2018-01-27 ENCOUNTER — Inpatient Hospital Stay: Payer: PPO | Attending: Oncology

## 2018-01-27 ENCOUNTER — Inpatient Hospital Stay: Payer: PPO

## 2018-01-27 ENCOUNTER — Encounter: Payer: Self-pay | Admitting: Oncology

## 2018-01-27 VITALS — BP 91/50 | HR 58 | Resp 18

## 2018-01-27 VITALS — BP 122/66 | HR 61 | Temp 98.2°F | Resp 17 | Ht 61.0 in | Wt 132.8 lb

## 2018-01-27 DIAGNOSIS — C786 Secondary malignant neoplasm of retroperitoneum and peritoneum: Secondary | ICD-10-CM | POA: Insufficient documentation

## 2018-01-27 DIAGNOSIS — Z95828 Presence of other vascular implants and grafts: Secondary | ICD-10-CM

## 2018-01-27 DIAGNOSIS — Z5189 Encounter for other specified aftercare: Secondary | ICD-10-CM | POA: Diagnosis not present

## 2018-01-27 DIAGNOSIS — D6959 Other secondary thrombocytopenia: Secondary | ICD-10-CM | POA: Insufficient documentation

## 2018-01-27 DIAGNOSIS — D701 Agranulocytosis secondary to cancer chemotherapy: Secondary | ICD-10-CM | POA: Insufficient documentation

## 2018-01-27 DIAGNOSIS — R110 Nausea: Secondary | ICD-10-CM

## 2018-01-27 DIAGNOSIS — C50919 Malignant neoplasm of unspecified site of unspecified female breast: Secondary | ICD-10-CM

## 2018-01-27 DIAGNOSIS — Z170 Estrogen receptor positive status [ER+]: Secondary | ICD-10-CM | POA: Diagnosis not present

## 2018-01-27 DIAGNOSIS — C569 Malignant neoplasm of unspecified ovary: Secondary | ICD-10-CM

## 2018-01-27 DIAGNOSIS — C50912 Malignant neoplasm of unspecified site of left female breast: Secondary | ICD-10-CM

## 2018-01-27 DIAGNOSIS — R530 Neoplastic (malignant) related fatigue: Secondary | ICD-10-CM | POA: Diagnosis not present

## 2018-01-27 DIAGNOSIS — Z79899 Other long term (current) drug therapy: Secondary | ICD-10-CM | POA: Insufficient documentation

## 2018-01-27 DIAGNOSIS — C482 Malignant neoplasm of peritoneum, unspecified: Secondary | ICD-10-CM

## 2018-01-27 DIAGNOSIS — Z5111 Encounter for antineoplastic chemotherapy: Secondary | ICD-10-CM | POA: Diagnosis not present

## 2018-01-27 DIAGNOSIS — D649 Anemia, unspecified: Secondary | ICD-10-CM | POA: Diagnosis not present

## 2018-01-27 DIAGNOSIS — Z5112 Encounter for antineoplastic immunotherapy: Secondary | ICD-10-CM | POA: Diagnosis not present

## 2018-01-27 DIAGNOSIS — R630 Anorexia: Secondary | ICD-10-CM | POA: Diagnosis not present

## 2018-01-27 DIAGNOSIS — R634 Abnormal weight loss: Secondary | ICD-10-CM | POA: Diagnosis not present

## 2018-01-27 LAB — CBC WITH DIFFERENTIAL (CANCER CENTER ONLY)
Abs Immature Granulocytes: 0.2 10*3/uL — ABNORMAL HIGH (ref 0.00–0.07)
Basophils Absolute: 0 10*3/uL (ref 0.0–0.1)
Basophils Relative: 0 %
Eosinophils Absolute: 0 10*3/uL (ref 0.0–0.5)
Eosinophils Relative: 1 %
HCT: 33.4 % — ABNORMAL LOW (ref 36.0–46.0)
Hemoglobin: 9.9 g/dL — ABNORMAL LOW (ref 12.0–15.0)
Immature Granulocytes: 6 %
Lymphocytes Relative: 47 %
Lymphs Abs: 1.6 10*3/uL (ref 0.7–4.0)
MCH: 32.4 pg (ref 26.0–34.0)
MCHC: 29.6 g/dL — ABNORMAL LOW (ref 30.0–36.0)
MCV: 109.2 fL — ABNORMAL HIGH (ref 80.0–100.0)
Monocytes Absolute: 0.4 10*3/uL (ref 0.1–1.0)
Monocytes Relative: 12 %
Neutro Abs: 1.1 10*3/uL — ABNORMAL LOW (ref 1.7–7.7)
Neutrophils Relative %: 34 %
Platelet Count: 34 10*3/uL — ABNORMAL LOW (ref 150–400)
RBC: 3.06 MIL/uL — AB (ref 3.87–5.11)
RDW: 20.6 % — AB (ref 11.5–15.5)
WBC Count: 3.3 10*3/uL — ABNORMAL LOW (ref 4.0–10.5)
nRBC: 0 % (ref 0.0–0.2)

## 2018-01-27 LAB — CMP (CANCER CENTER ONLY)
ALT: <6 U/L (ref 0–44)
AST: 14 U/L — ABNORMAL LOW (ref 15–41)
Albumin: 3.4 g/dL — ABNORMAL LOW (ref 3.5–5.0)
Alkaline Phosphatase: 74 U/L (ref 38–126)
Anion gap: 8 (ref 5–15)
BILIRUBIN TOTAL: 0.5 mg/dL (ref 0.3–1.2)
BUN: 11 mg/dL (ref 6–20)
CO2: 27 mmol/L (ref 22–32)
Calcium: 8.9 mg/dL (ref 8.9–10.3)
Chloride: 107 mmol/L (ref 98–111)
Creatinine: 0.69 mg/dL (ref 0.44–1.00)
GFR, Est AFR Am: >60 mL/min (ref >60)
GFR, Estimated: >60 mL/min (ref >60)
Glucose, Bld: 94 mg/dL (ref 70–99)
Potassium: 4.3 mmol/L (ref 3.5–5.1)
Sodium: 142 mmol/L (ref 135–145)
TOTAL PROTEIN: 7.4 g/dL (ref 6.5–8.1)

## 2018-01-27 LAB — TOTAL PROTEIN, URINE DIPSTICK: PROTEIN: NEGATIVE mg/dL

## 2018-01-27 MED ORDER — HEPARIN SOD (PORK) LOCK FLUSH 100 UNIT/ML IV SOLN
500.0000 [IU] | Freq: Once | INTRAVENOUS | Status: AC | PRN
  Administered 2018-01-27: 500 [IU]
  Filled 2018-01-27: qty 5

## 2018-01-27 MED ORDER — SODIUM CHLORIDE 0.9% FLUSH
10.0000 mL | INTRAVENOUS | Status: DC | PRN
  Administered 2018-01-27: 10 mL
  Filled 2018-01-27: qty 10

## 2018-01-27 MED ORDER — PROCHLORPERAZINE MALEATE 10 MG PO TABS
10.0000 mg | ORAL_TABLET | Freq: Once | ORAL | Status: AC
  Administered 2018-01-27: 10 mg via ORAL

## 2018-01-27 MED ORDER — SODIUM CHLORIDE 0.9 % IV SOLN: 10.0000 mg | Freq: Once | INTRAVENOUS | Status: DC

## 2018-01-27 MED ORDER — SODIUM CHLORIDE 0.9 % IV SOLN
Freq: Once | INTRAVENOUS | Status: AC
  Administered 2018-01-27: 10:00:00 via INTRAVENOUS
  Filled 2018-01-27: qty 250

## 2018-01-27 MED ORDER — PALONOSETRON HCL INJECTION 0.25 MG/5ML
INTRAVENOUS | Status: AC
  Filled 2018-01-27: qty 5

## 2018-01-27 MED ORDER — PROCHLORPERAZINE MALEATE 10 MG PO TABS
ORAL_TABLET | ORAL | Status: AC
  Filled 2018-01-27: qty 1

## 2018-01-27 MED ORDER — PALONOSETRON HCL INJECTION 0.25 MG/5ML
0.2500 mg | Freq: Once | INTRAVENOUS | Status: AC
  Administered 2018-01-27: 0.25 mg via INTRAVENOUS

## 2018-01-27 MED ORDER — DEXAMETHASONE SODIUM PHOSPHATE 10 MG/ML IJ SOLN
INTRAMUSCULAR | Status: AC
  Filled 2018-01-27: qty 1

## 2018-01-27 MED ORDER — SODIUM CHLORIDE 0.9 % IV SOLN
800.0000 mg/m2 | Freq: Once | INTRAVENOUS | Status: AC
  Administered 2018-01-27: 1330 mg via INTRAVENOUS
  Filled 2018-01-27: qty 34.98

## 2018-01-27 MED ORDER — DEXAMETHASONE SODIUM PHOSPHATE 10 MG/ML IJ SOLN
10.0000 mg | Freq: Once | INTRAMUSCULAR | Status: AC
  Administered 2018-01-27: 10 mg via INTRAVENOUS

## 2018-01-27 MED ORDER — SODIUM CHLORIDE 0.9 % IV SOLN
14.2000 mg/kg | Freq: Once | INTRAVENOUS | Status: AC
  Administered 2018-01-27: 900 mg via INTRAVENOUS
  Filled 2018-01-27: qty 32

## 2018-01-27 MED ORDER — SODIUM CHLORIDE 0.9% FLUSH
10.0000 mL | INTRAVENOUS | Status: DC | PRN
  Filled 2018-01-27: qty 10

## 2018-01-27 NOTE — Progress Notes (Signed)
Hematology and Oncology Follow Up Visit  [name] [id number] 1955-09-29 62 y.o. 01/27/2018 9:35 AM    Principle Diagnosis: 62 year old woman with:  1.  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  2.  Left-sided breast cancer diagnosed in 2017.  She was found to have ER, PR positive HER-2.   Prior Therapy:   Carboplatin and Taxotere started on 10/01/2012. Avastin was added with cycle 4. Chemotherapy only discontinued in June of 2015.  She is S/P Avastin maintenance only between June 2015 till March 2017. Therapy discontinued due to progression of disease.  She is status post Doxil salvage chemotherapy therapy discontinued because of hypersensitivity reaction in March 2017.  Carboplatin and AUC of 5 started on 07/08/2015.  Therapy discontinued in August 2019 after progression of disease.   Current therapy:   Gemcitabine will be given on day 1 and day 8 of a 21-day cycle with Avastin will be given once every 3 weeks.  Gemcitabine has been given once every 3 weeks for better tolerance.   Interim History: [name] is here for a repeat evaluation.  Since the last visit, she reports no major changes.  She does report some mild nausea and poor appetite.  She has lost weight since the last visit and down by 3 pounds.  She denies any bleeding issues including hemoptysis, hematochezia or melena.  Her performance status remains adequate continues to ambulate with the help of a cane.  She did have one fall although no injuries.  She does not report any headaches, blurry vision, or seizures.  She denies any alteration in mental status or lethargy.  She does not report any chest pain, palpitation, orthopnea or leg edema.  Has not reported any shortness of breath or cough.  He denies any constipation or diarrhea.  She does not report any hematuria or dysuria.  She does not report any arthralgias or myalgias.  She does not  report any easy bruising or petechiae.   Does not report any lymphadenopathy or masses.  Does not report any heat or cold intolerance.  Does not report any mood changes.  Remainder of the review of systems is negative.   Medications: I have reviewed the patient's current medications.  Current Outpatient Medications  Medication Sig Dispense Refill  . acetaminophen (TYLENOL) 325 MG tablet Take 2 tablets (650 mg total) by mouth every 4 (four) hours as needed for mild pain, fever or headache. 30 tablet 0  . acetaminophen (TYLENOL) 500 MG tablet Take 500 mg by mouth every 6 (six) hours as needed for moderate pain.    . Acetylcarnitine HCl (ACETYL L-CARNITINE PO) Take 2,000 mg by mouth at bedtime.    . ALPRAZolam (XANAX) 0.5 MG tablet Take 0.5 mg by mouth 2 (two) times daily as needed for anxiety.    . AMBULATORY NON FORMULARY MEDICATION Medication Name: Cardizem 2 %/Lidocaine 5% compounded. Apply small amount 1 inch  Inside Anus. 3-4 times daily until symptoms resolve. 30 g 1  . atorvastatin (LIPITOR) 40 MG tablet Take 1 tablet (40 mg total) by mouth daily. 90 tablet 2  . carvedilol (COREG) 3.125 MG tablet Take 1/2 tablet by mouth twice daily with meals. 30 tablet 11  . clotrimazole-betamethasone (LOTRISONE) cream Apply 1 application topically 2 (two) times daily. 30 g 1  . divalproex (DEPAKOTE ER) 250 MG 24 hr tablet Take 250 mg by mouth daily.     . divalproex (DEPAKOTE ER) 500 MG 24  hr tablet Take 1,000 mg by mouth every evening.     . lamoTRIgine (LAMICTAL) 100 MG tablet Take 100 mg by mouth 2 (two) times daily.    Marland Kitchen levothyroxine (SYNTHROID, LEVOTHROID) 100 MCG tablet Take 1 tablet (100 mcg total) by mouth daily. 90 tablet 1  . lidocaine-prilocaine (EMLA) cream Apply topically as needed. Apply to port with every chemotherapy. 30 g 2  . loperamide (IMODIUM A-D) 2 MG tablet Take 2 mg by mouth as needed for diarrhea or loose stools.    Marland Kitchen loratadine (CLARITIN) 10 MG tablet Take 10 mg by mouth daily  as needed for allergies (with chemo).    . nitroGLYCERIN (NITROSTAT) 0.4 MG SL tablet Place 1 tablet (0.4 mg total) under the tongue every 5 (five) minutes as needed for chest pain. 25 tablet 3  . ondansetron (ZOFRAN ODT) 8 MG disintegrating tablet Take 1 tablet (8 mg total) by mouth every 8 (eight) hours as needed for nausea or vomiting. 20 tablet 1  . ondansetron (ZOFRAN) 8 MG tablet TAKE 1 TABLET BY MOUTH EVERY 8 HOURS AS NEEDED FOR NAUSEA AND VOMITING 30 tablet 2  . pantoprazole (PROTONIX) 40 MG tablet Take 1 tablet (40 mg total) by mouth daily. 90 tablet 1  . prochlorperazine (COMPAZINE) 10 MG tablet Take 1 tablet (10 mg total) by mouth every 6 (six) hours as needed for nausea or vomiting. 30 tablet 0  . rOPINIRole (REQUIP) 0.5 MG tablet Take 3 tablets (1.5 mg total) by mouth at bedtime. 90 tablet 4  . sertraline (ZOLOFT) 25 MG tablet Take 75 mg by mouth daily.      No current facility-administered medications for this visit.    Facility-Administered Medications Ordered in Other Visits  Medication Dose Route Frequency Provider Last Rate Last Dose  . 0.9 %  sodium chloride infusion   Intravenous Once Wyatt Portela, MD      . heparin lock flush 100 unit/mL  500 Units Intravenous Once Wyatt Portela, MD      . sodium chloride flush (NS) 0.9 % injection 10 mL  10 mL Intravenous PRN Wyatt Portela, MD      . sodium chloride flush (NS) 0.9 % injection 10 mL  10 mL Intracatheter PRN Wyatt Portela, MD   10 mL at 01/06/18 1349  . sodium chloride flush (NS) 0.9 % injection 10 mL  10 mL Intracatheter PRN Wyatt Portela, MD         Allergies:  Allergies  Allergen Reactions  . Doxil [Doxorubicin Hcl Liposomal] Anaphylaxis    1st Doxil.   . Pollen Extract Other (See Comments)    Pollen and grass causes a lot sneezing  . Prednisone     "crawled up the wall" per pt. Tolerated flonase and a duo-neb    Past Medical History, Surgical history, Social history, and Family History reviewed and  unchanged.    Physical Exam:  Blood pressure 122/66, pulse 61, temperature 98.2 F (36.8 C), temperature source Oral, resp. rate 17, height _0  (1.549 m), weight 132 lb 12.8 oz (60.2 kg), SpO2 99 %.     ECOG 1   General appearance: Alert, awake without any distress. Head: Atraumatic without abnormalities Oropharynx: Without any thrush or ulcers. Eyes: No scleral icterus. Lymph nodes: No lymphadenopathy noted in the cervical, supraclavicular, or axillary nodes Heart:regular rate and rhythm, without any murmurs or gallops.   Lung: Clear to auscultation without any rhonchi, wheezes or dullness to percussion. Abdomin: Soft, nontender  without any shifting dullness or ascites. Musculoskeletal: No clubbing or cyanosis. Neurological: No motor or sensory deficits. Skin: No rashes or lesions. Psychiatric: Mood and affect appeared normal.              Lab Results: Lab Results  Component Value Date   WBC 7.9 01/06/2018   HGB 9.4 (L) 01/06/2018   HCT 31.7 (L) 01/06/2018   MCV 112.8 (H) 01/06/2018   PLT 58 (L) 01/06/2018     Chemistry      Component Value Date/Time   NA 142 01/06/2018 1040   NA 142 01/21/2017 0822   K 4.3 01/06/2018 1040   K 4.1 01/21/2017 0822   CL 107 01/06/2018 1040   CO2 29 01/06/2018 1040   CO2 27 01/21/2017 0822   BUN 9 01/06/2018 1040   BUN 12.2 01/21/2017 0822   CREATININE 0.68 01/06/2018 1040   CREATININE 0.7 01/21/2017 0822      Component Value Date/Time   CALCIUM 8.8 (L) 01/06/2018 1040   CALCIUM 8.9 01/21/2017 0822   ALKPHOS 113 01/06/2018 1040   ALKPHOS 75 01/21/2017 0822   AST 30 01/06/2018 1040   AST 12 01/21/2017 0822   ALT 11 01/06/2018 1040   ALT 7 01/21/2017 0822   BILITOT 0.6 01/06/2018 1040   BILITOT 0.23 01/21/2017 0822            Impression and Plan:  62 year old woman with:  1.  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    She has been receiving a Avastin with gemcitabine with  therapy that has been reasonably tolerated except for mild nausea and anorexia.  Risks and benefits of continuing this therapy was discussed today.  Long-term complications including cytopenias mostly thrombocytopenia, bleeding and infection were discussed.  Her platelet count although low today have been fluctuating.  After discussion today she is willing to proceed and will repeat imaging studies in January 2020.  2. IV access: Port-A-Cath has been accessed without any issues today.  3. Thrombocytopenia: No active bleeding noted.  We will continue to monitor closely.  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  4. Left breast cancer: Remains relatively stable.  No definitive treatment beyond chemotherapy is needed.   5. Neutropenia: Related to chemotherapy.  She will receive growth factor support after each cycle of therapy.  6.  Anemia: Hemoglobin is adequate today and does not require any treatment.  7.  Nausea: Mild and manageable at this time.  Will use antiemetics and dexamethasone after each treatment.  8. Followup: We will be in 3 weeks for cycle of chemotherapy.  25  minutes was spent with the patient face-to-face today.  More than 50% of time was dedicated to reviewing the natural course of her disease, risks and benefits of treatment and complications related to therapy.  Zola Button, MD 12/9/20199:35 AM

## 2018-01-27 NOTE — Patient Instructions (Signed)
[facility] Discharge Instructions for Patients Receiving Chemotherapy  Today you received the following chemotherapy agents Avastin and Gemzar  To help prevent nausea and vomiting after your treatment, we encourage you to take your nausea medication as directed   If you develop nausea and vomiting that is not controlled by your nausea medication, call the clinic.   BELOW ARE SYMPTOMS THAT SHOULD BE REPORTED IMMEDIATELY:  *FEVER GREATER THAN 100.5 F  *CHILLS WITH OR WITHOUT FEVER  NAUSEA AND VOMITING THAT IS NOT CONTROLLED WITH YOUR NAUSEA MEDICATION  *UNUSUAL SHORTNESS OF BREATH  *UNUSUAL BRUISING OR BLEEDING  TENDERNESS IN MOUTH AND THROAT WITH OR WITHOUT PRESENCE OF ULCERS  *URINARY PROBLEMS  *BOWEL PROBLEMS  UNUSUAL RASH Items with * indicate a potential emergency and should be followed up as soon as possible.  Feel free to call the clinic should you have any questions or concerns. The clinic phone number is [phone].  Please show the [place] at check-in to the Emergency Department and triage nurse.

## 2018-01-27 NOTE — Progress Notes (Signed)
Okay to treat with today's labs and do not need to wait for CMP or urine protein results prior to treatement per Dr. Alen Blew.

## 2018-01-28 ENCOUNTER — Inpatient Hospital Stay: Payer: PPO

## 2018-01-28 VITALS — BP 117/38 | HR 61 | Temp 97.7°F | Resp 16

## 2018-01-28 DIAGNOSIS — Z5112 Encounter for antineoplastic immunotherapy: Secondary | ICD-10-CM | POA: Diagnosis not present

## 2018-01-28 DIAGNOSIS — C482 Malignant neoplasm of peritoneum, unspecified: Secondary | ICD-10-CM

## 2018-01-28 LAB — CA 125: CANCER ANTIGEN (CA) 125: 40.5 U/mL — AB (ref 0.0–38.1)

## 2018-01-28 MED ORDER — PEGFILGRASTIM-CBQV 6 MG/0.6ML ~~LOC~~ SOSY
6.0000 mg | PREFILLED_SYRINGE | Freq: Once | SUBCUTANEOUS | Status: AC
  Administered 2018-01-28: 6 mg via SUBCUTANEOUS

## 2018-01-28 MED ORDER — PEGFILGRASTIM-CBQV 6 MG/0.6ML ~~LOC~~ SOSY
PREFILLED_SYRINGE | SUBCUTANEOUS | Status: AC
  Filled 2018-01-28: qty 0.6

## 2018-01-31 ENCOUNTER — Ambulatory Visit: Payer: PPO | Admitting: Physician Assistant

## 2018-02-06 ENCOUNTER — Telehealth: Payer: Self-pay | Admitting: Physician Assistant

## 2018-02-06 NOTE — Telephone Encounter (Signed)
Have you heard anything from the therapist? It has been a long process for the both of [place]. Please call.

## 2018-02-10 NOTE — Telephone Encounter (Signed)
Pt given message from provider.

## 2018-02-11 ENCOUNTER — Other Ambulatory Visit: Payer: Self-pay | Admitting: Family Medicine

## 2018-02-11 DIAGNOSIS — E039 Hypothyroidism, unspecified: Secondary | ICD-10-CM

## 2018-02-13 ENCOUNTER — Ambulatory Visit (INDEPENDENT_AMBULATORY_CARE_PROVIDER_SITE_OTHER): Payer: PPO | Admitting: Gynecology

## 2018-02-13 ENCOUNTER — Encounter: Payer: Self-pay | Admitting: Gynecology

## 2018-02-13 VITALS — BP 112/72 | Ht 60.75 in | Wt 131.6 lb

## 2018-02-13 DIAGNOSIS — Z01419 Encounter for gynecological examination (general) (routine) without abnormal findings: Secondary | ICD-10-CM | POA: Diagnosis not present

## 2018-02-13 DIAGNOSIS — N952 Postmenopausal atrophic vaginitis: Secondary | ICD-10-CM

## 2018-02-13 DIAGNOSIS — Z853 Personal history of malignant neoplasm of breast: Secondary | ICD-10-CM

## 2018-02-13 DIAGNOSIS — Z9289 Personal history of other medical treatment: Secondary | ICD-10-CM

## 2018-02-13 DIAGNOSIS — Z9189 Other specified personal risk factors, not elsewhere classified: Secondary | ICD-10-CM | POA: Diagnosis not present

## 2018-02-13 DIAGNOSIS — C482 Malignant neoplasm of peritoneum, unspecified: Secondary | ICD-10-CM

## 2018-02-13 DIAGNOSIS — M8580 Other specified disorders of bone density and structure, unspecified site: Secondary | ICD-10-CM

## 2018-02-13 NOTE — Patient Instructions (Signed)
Follow-up in 1 year, sooner if any gynecologic issues.

## 2018-02-13 NOTE — Progress Notes (Signed)
    Stephenia Vogan [facility] 09/29/1955 [id number]        62 y.o.  G0P0 for breast and pelvic exam.  Without gynecologic complaints.  Past medical history,surgical history, problem list, medications, allergies, family history and social history were all reviewed and documented as reviewed in the EPIC chart.  ROS:  Performed with pertinent positives and negatives included in the history, assessment and plan.   Additional significant findings : None   Exam: Sharrie Rothman assistant Vitals:   02/13/18 1456  BP: 112/72  Weight: 131 lb 9.6 oz (59.7 kg)  Height: 5' 0.75" (1.543 m)   Body mass index is 25.07 kg/m.  General appearance:  Normal affect, orientation and appearance. Skin: Grossly normal HEENT: Without gross lesions.  No cervical or supraclavicular adenopathy. Thyroid normal.  Lungs:  Clear without wheezing, rales or rhonchi Cardiac: RR, without RMG Abdominal:  Soft, nontender, without masses, guarding, rebound, organomegaly or hernia Breasts:  Examined lying and sitting.  Left without masses, retractions, discharge or axillary adenopathy.  Right status post lumpectomy with radiation changes.  No masses or axillary adenopathy Pelvic:  Ext, BUS, Vagina: With atrophic changes  Cervix: With atrophic changes  Uterus: Axial, normal size, shape and contour, midline and mobile nontender   Adnexa: Without masses or tenderness    Anus and perineum: Normal   Rectovaginal: Deferred  Assessment/Plan:  62 y.o. G0P0 female for breast and pelvic exam.   1. Postmenopausal.  No significant menopausal symptoms or any bleeding. 2. Pap smear 11/2015.  No Pap smear done today.  No history of abnormal Pap smears.  Plan repeat Pap smear next year at 3-year interval. 3. History of right breast cancer diagnosed 1989.  Left breast cancer diagnosed 2017.  Actively being followed by oncology.  Exam NED. 4. Primary peritoneal carcinoma since 2014.  Undergoing salvage chemotherapy actively managed by oncology. 5. DEXA  2011 T score -2.1.  We again discussed the issues of baseline DEXA now and the issue as to whether she would be treated with medication.  I asked her to discuss this with oncology and follow their recommendations as far as bone surveillance. 6. Colonoscopy 2016.  Repeat at their recommended interval.  Has anal fissure that she is treating with creams.  She asked that I defer the rectal exam which I did. 7. Health maintenance.  No routine lab work done as she does this elsewhere.  Follow-up 1 year, sooner as needed.     Anastasio Auerbach MD, 3:26 PM 02/13/2018

## 2018-02-17 ENCOUNTER — Inpatient Hospital Stay: Payer: PPO

## 2018-02-17 ENCOUNTER — Telehealth: Payer: Self-pay | Admitting: Oncology

## 2018-02-17 ENCOUNTER — Inpatient Hospital Stay (HOSPITAL_BASED_OUTPATIENT_CLINIC_OR_DEPARTMENT_OTHER): Payer: PPO | Admitting: Oncology

## 2018-02-17 VITALS — BP 98/56 | HR 61

## 2018-02-17 VITALS — HR 59 | Temp 98.3°F | Resp 18 | Ht 60.75 in | Wt 131.3 lb

## 2018-02-17 DIAGNOSIS — Z79899 Other long term (current) drug therapy: Secondary | ICD-10-CM

## 2018-02-17 DIAGNOSIS — D649 Anemia, unspecified: Secondary | ICD-10-CM | POA: Diagnosis not present

## 2018-02-17 DIAGNOSIS — C569 Malignant neoplasm of unspecified ovary: Secondary | ICD-10-CM

## 2018-02-17 DIAGNOSIS — C786 Secondary malignant neoplasm of retroperitoneum and peritoneum: Secondary | ICD-10-CM | POA: Diagnosis not present

## 2018-02-17 DIAGNOSIS — D701 Agranulocytosis secondary to cancer chemotherapy: Secondary | ICD-10-CM

## 2018-02-17 DIAGNOSIS — D6959 Other secondary thrombocytopenia: Secondary | ICD-10-CM | POA: Diagnosis not present

## 2018-02-17 DIAGNOSIS — C482 Malignant neoplasm of peritoneum, unspecified: Secondary | ICD-10-CM

## 2018-02-17 DIAGNOSIS — C50912 Malignant neoplasm of unspecified site of left female breast: Secondary | ICD-10-CM

## 2018-02-17 DIAGNOSIS — Z5112 Encounter for antineoplastic immunotherapy: Secondary | ICD-10-CM | POA: Diagnosis not present

## 2018-02-17 DIAGNOSIS — Z170 Estrogen receptor positive status [ER+]: Secondary | ICD-10-CM

## 2018-02-17 DIAGNOSIS — Z95828 Presence of other vascular implants and grafts: Secondary | ICD-10-CM

## 2018-02-17 DIAGNOSIS — R110 Nausea: Secondary | ICD-10-CM | POA: Diagnosis not present

## 2018-02-17 DIAGNOSIS — R530 Neoplastic (malignant) related fatigue: Secondary | ICD-10-CM | POA: Diagnosis not present

## 2018-02-17 DIAGNOSIS — C50919 Malignant neoplasm of unspecified site of unspecified female breast: Secondary | ICD-10-CM

## 2018-02-17 DIAGNOSIS — R630 Anorexia: Secondary | ICD-10-CM | POA: Diagnosis not present

## 2018-02-17 DIAGNOSIS — R634 Abnormal weight loss: Secondary | ICD-10-CM

## 2018-02-17 LAB — CBC WITH DIFFERENTIAL (CANCER CENTER ONLY)
Abs Immature Granulocytes: 0.27 10*3/uL — ABNORMAL HIGH (ref 0.00–0.07)
Basophils Absolute: 0 10*3/uL (ref 0.0–0.1)
Basophils Relative: 0 %
Eosinophils Absolute: 0 10*3/uL (ref 0.0–0.5)
Eosinophils Relative: 1 %
HCT: 30 % — ABNORMAL LOW (ref 36.0–46.0)
Hemoglobin: 8.9 g/dL — ABNORMAL LOW (ref 12.0–15.0)
Immature Granulocytes: 5 %
Lymphocytes Relative: 25 %
Lymphs Abs: 1.3 10*3/uL (ref 0.7–4.0)
MCH: 33 pg (ref 26.0–34.0)
MCHC: 29.7 g/dL — ABNORMAL LOW (ref 30.0–36.0)
MCV: 111.1 fL — ABNORMAL HIGH (ref 80.0–100.0)
Monocytes Absolute: 0.4 10*3/uL (ref 0.1–1.0)
Monocytes Relative: 9 %
Neutro Abs: 3 10*3/uL (ref 1.7–7.7)
Neutrophils Relative %: 60 %
PLATELETS: 44 10*3/uL — AB (ref 150–400)
RBC: 2.7 MIL/uL — AB (ref 3.87–5.11)
RDW: 20.7 % — ABNORMAL HIGH (ref 11.5–15.5)
WBC Count: 5 10*3/uL (ref 4.0–10.5)
nRBC: 1.2 % — ABNORMAL HIGH (ref 0.0–0.2)

## 2018-02-17 LAB — CMP (CANCER CENTER ONLY)
ALT: 9 U/L (ref 0–44)
ANION GAP: 8 (ref 5–15)
AST: 16 U/L (ref 15–41)
Albumin: 3.1 g/dL — ABNORMAL LOW (ref 3.5–5.0)
Alkaline Phosphatase: 73 U/L (ref 38–126)
BUN: 17 mg/dL (ref 6–20)
CO2: 26 mmol/L (ref 22–32)
Calcium: 8.8 mg/dL — ABNORMAL LOW (ref 8.9–10.3)
Chloride: 105 mmol/L (ref 98–111)
Creatinine: 0.72 mg/dL (ref 0.44–1.00)
GFR, Est AFR Am: >60 mL/min (ref >60)
GFR, Estimated: >60 mL/min (ref >60)
Glucose, Bld: 91 mg/dL (ref 70–99)
Potassium: 4.4 mmol/L (ref 3.5–5.1)
Sodium: 139 mmol/L (ref 135–145)
Total Bilirubin: 0.7 mg/dL (ref 0.3–1.2)
Total Protein: 7.2 g/dL (ref 6.5–8.1)

## 2018-02-17 LAB — TOTAL PROTEIN, URINE DIPSTICK: Protein, ur: NEGATIVE mg/dL

## 2018-02-17 MED ORDER — SODIUM CHLORIDE 0.9% FLUSH
10.0000 mL | INTRAVENOUS | Status: DC | PRN
  Administered 2018-02-17: 10 mL
  Filled 2018-02-17: qty 10

## 2018-02-17 MED ORDER — SODIUM CHLORIDE 0.9 % IV SOLN
14.2000 mg/kg | Freq: Once | INTRAVENOUS | Status: AC
  Administered 2018-02-17: 900 mg via INTRAVENOUS
  Filled 2018-02-17: qty 32

## 2018-02-17 MED ORDER — HEPARIN SOD (PORK) LOCK FLUSH 100 UNIT/ML IV SOLN
500.0000 [IU] | Freq: Once | INTRAVENOUS | Status: AC | PRN
  Administered 2018-02-17: 500 [IU]
  Filled 2018-02-17: qty 5

## 2018-02-17 MED ORDER — SODIUM CHLORIDE 0.9 % IV SOLN
Freq: Once | INTRAVENOUS | Status: AC
  Administered 2018-02-17: 12:00:00 via INTRAVENOUS
  Filled 2018-02-17: qty 250

## 2018-02-17 NOTE — Telephone Encounter (Signed)
Patient stated they did not need contrast and that they were going to use the water based the day of the CT appointment.  Printed avs.

## 2018-02-17 NOTE — Progress Notes (Signed)
Hematology and Oncology Follow Up Visit  [name] [id number] 06-28-1955 62 y.o. 02/17/2018 11:06 AM    Principle Diagnosis: 62 year old woman with:  1.  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  .  2.  ER, PR positive HER-2 negative left-sided breast cancer diagnosed in 2017.    Prior Therapy:   Carboplatin and Taxotere started on 10/01/2012. Avastin was added with cycle 4. Chemotherapy only discontinued in June of 2015.  She is S/P Avastin maintenance only between June 2015 till March 2017. Therapy discontinued due to progression of disease.  She is status post Doxil salvage chemotherapy therapy discontinued because of hypersensitivity reaction in March 2017.  Carboplatin and AUC of 5 started on 07/08/2015.  Therapy discontinued in August 2019 after progression of disease.   Current therapy:   Gemcitabine will be given on day 1 and day 8 of a 21-day cycle with Avastin will be given once every 3 weeks.  Gemcitabine has been given once every 3 weeks due to cytopenia.   Interim History: [name] returns today for a repeat evaluation.  Since last visit, she is experiencing more fatigue and tiredness associated with chemotherapy as well as mild nausea and anorexia.  Her performance status has been limited and she did have one episode of a fall noted last 24 hours.  Her performance status and activity level is declining overall.  He does report occasional hematochezia associated with anal fissures but otherwise no other complaints.  She does not report any headaches, blurry vision, or seizures.  She denies any confusion or lethargy.  She does not report any chest pain, palpitation, orthopnea or leg edema.  Has not reported any shortness of breath or cough.  She denies any change in bowel habits.  She does not report any hematuria or dysuria.  She does not report any pain or pathological fractures.  She does  not report any easy petechiae or bleeding.   Does not report any anxiety or depression.  Does not report any heat or cold intolerance.  Remainder of the review of systems is negative.   Medications: I have reviewed the patient's current medications.  Current Outpatient Medications  Medication Sig Dispense Refill  . acetaminophen (TYLENOL) 325 MG tablet Take 2 tablets (650 mg total) by mouth every 4 (four) hours as needed for mild pain, fever or headache. 30 tablet 0  . acetaminophen (TYLENOL) 500 MG tablet Take 500 mg by mouth every 6 (six) hours as needed for moderate pain.    . Acetylcarnitine HCl (ACETYL L-CARNITINE PO) Take 2,000 mg by mouth at bedtime.    . ALPRAZolam (XANAX) 0.5 MG tablet Take 0.5 mg by mouth 2 (two) times daily as needed for anxiety.    . AMBULATORY NON FORMULARY MEDICATION Medication Name: Cardizem 2 %/Lidocaine 5% compounded. Apply small amount 1 inch  Inside Anus. 3-4 times daily until symptoms resolve. 30 g 1  . atorvastatin (LIPITOR) 40 MG tablet Take 1 tablet (40 mg total) by mouth daily. 90 tablet 2  . carvedilol (COREG) 3.125 MG tablet Take 1/2 tablet by mouth twice daily with meals. 30 tablet 11  . clotrimazole-betamethasone (LOTRISONE) cream Apply 1 application topically 2 (two) times daily. 30 g 1  . divalproex (DEPAKOTE ER) 250 MG 24 hr tablet Take 250 mg by mouth daily.     . divalproex (DEPAKOTE ER) 500 MG 24 hr tablet Take 1,000 mg by mouth every evening.     Marland Kitchen  lamoTRIgine (LAMICTAL) 100 MG tablet Take 100 mg by mouth 2 (two) times daily.    Marland Kitchen levothyroxine (SYNTHROID, LEVOTHROID) 100 MCG tablet Take 1 tablet (100 mcg total) by mouth daily. 90 tablet 0  . lidocaine-prilocaine (EMLA) cream Apply topically as needed. Apply to port with every chemotherapy. 30 g 2  . loperamide (IMODIUM A-D) 2 MG tablet Take 2 mg by mouth as needed for diarrhea or loose stools.    Marland Kitchen loratadine (CLARITIN) 10 MG tablet Take 10 mg by mouth daily as needed for allergies (with  chemo).    . nitroGLYCERIN (NITROSTAT) 0.4 MG SL tablet Place 1 tablet (0.4 mg total) under the tongue every 5 (five) minutes as needed for chest pain. 25 tablet 3  . ondansetron (ZOFRAN ODT) 8 MG disintegrating tablet Take 1 tablet (8 mg total) by mouth every 8 (eight) hours as needed for nausea or vomiting. 20 tablet 1  . ondansetron (ZOFRAN) 8 MG tablet TAKE 1 TABLET BY MOUTH EVERY 8 HOURS AS NEEDED FOR NAUSEA AND VOMITING 30 tablet 2  . pantoprazole (PROTONIX) 40 MG tablet Take 1 tablet (40 mg total) by mouth daily. 90 tablet 1  . prochlorperazine (COMPAZINE) 10 MG tablet Take 1 tablet (10 mg total) by mouth every 6 (six) hours as needed for nausea or vomiting. 30 tablet 0  . rOPINIRole (REQUIP) 0.5 MG tablet Take 3 tablets (1.5 mg total) by mouth at bedtime. 90 tablet 4  . sertraline (ZOLOFT) 25 MG tablet Take 75 mg by mouth daily.      No current facility-administered medications for this visit.    Facility-Administered Medications Ordered in Other Visits  Medication Dose Route Frequency Provider Last Rate Last Dose  . 0.9 %  sodium chloride infusion   Intravenous Once Wyatt Portela, MD      . heparin lock flush 100 unit/mL  500 Units Intravenous Once Wyatt Portela, MD      . sodium chloride flush (NS) 0.9 % injection 10 mL  10 mL Intravenous PRN Wyatt Portela, MD      . sodium chloride flush (NS) 0.9 % injection 10 mL  10 mL Intracatheter PRN Wyatt Portela, MD   10 mL at 01/06/18 1349     Allergies:  Allergies  Allergen Reactions  . Doxil [Doxorubicin Hcl Liposomal] Anaphylaxis    1st Doxil.   . Pollen Extract Other (See Comments)    Pollen and grass causes a lot sneezing  . Prednisone     "crawled up the wall" per pt. Tolerated flonase and a duo-neb    Past Medical History, Surgical history, Social history, and Family History reviewed and unchanged.    Physical Exam:  Pulse (!) 59, temperature 98.3 F (36.8 C), temperature source Oral, resp. rate 18, height 5'  0.75" (1.543 m), weight 131 lb 4.8 oz (59.6 kg), SpO2 100 %.     ECOG 1   General appearance: Comfortable appearing without any discomfort Head: Normocephalic without any trauma Oropharynx: Mucous membranes are moist and pink without any thrush or ulcers. Eyes: Pupils are equal and round reactive to light. Lymph nodes: No cervical, supraclavicular, inguinal or axillary lymphadenopathy.   Heart:regular rate and rhythm.  S1 and S2 without leg edema. Lung: Clear without any rhonchi or wheezes.  No dullness to percussion. Abdomin: Soft, nontender, nondistended with good bowel sounds.  No hepatosplenomegaly. Musculoskeletal: No joint deformity or effusion.  Full range of motion noted. Neurological: No deficits noted on motor, sensory and deep  tendon reflex exam. Skin: No petechial rash or dryness.  Appeared moist.               Lab Results: Lab Results  Component Value Date   WBC 5.0 02/17/2018   HGB 8.9 (L) 02/17/2018   HCT 30.0 (L) 02/17/2018   MCV 111.1 (H) 02/17/2018   PLT 44 (L) 02/17/2018     Chemistry      Component Value Date/Time   NA 142 01/27/2018 0930   NA 142 01/21/2017 0822   K 4.3 01/27/2018 0930   K 4.1 01/21/2017 0822   CL 107 01/27/2018 0930   CO2 27 01/27/2018 0930   CO2 27 01/21/2017 0822   BUN 11 01/27/2018 0930   BUN 12.2 01/21/2017 0822   CREATININE 0.69 01/27/2018 0930   CREATININE 0.7 01/21/2017 0822      Component Value Date/Time   CALCIUM 8.9 01/27/2018 0930   CALCIUM 8.9 01/21/2017 0822   ALKPHOS 74 01/27/2018 0930   ALKPHOS 75 01/21/2017 0822   AST 14 (L) 01/27/2018 0930   AST 12 01/21/2017 0822   ALT <6 01/27/2018 0930   ALT 7 01/21/2017 0822   BILITOT 0.5 01/27/2018 0930   BILITOT 0.23 01/21/2017 4562            Impression and Plan:  62 year old woman with:  1.  Peritoneal carcinomatosis diagnosed in 2014.  She presented with adenocarcinoma likely arising from an ovarian primary.  Her tumor marker continues to  rise on the current therapy utilizing a Avastin and gemcitabine.  Risks and benefits of continuing this therapy was reviewed today and we have elected to defer gemcitabine treatments because of side effects.  We will restage her after completing her a Avastin cycle today.  Alternative therapies were reviewed today and will be discussed further after obtaining CT scan in 3 weeks.  2. IV access: Port-A-Cath will be used at this time and moving forward.  3. Thrombocytopenia: No bleeding noted at this time.  Her thrombocytopenia is related to chemotherapy.    4. Left breast cancer: Remains relatively stable.  No definitive treatment beyond chemotherapy is needed.   5. Neutropenia: She is currently receiving growth factor support which will be withheld with gemcitabine.  6.  Anemia: hemoglobin relatively stable without any need for transfusion.  7.  Nausea: Manageable at this time and anticipate improvement with stopping gemcitabine.  8. Followup: We will be in 3 weeks for cycle of chemotherapy.  25  minutes was spent with the patient face-to-face today.  More than 50% of time was dedicated to discussing her disease status, treatment options and coordinating plan of care.  Zola Button, MD 12/30/201911:06 AM

## 2018-02-17 NOTE — Progress Notes (Signed)
Okay to treat-Per Dr Alen Blew it is okay to treat pt today with avastin and urine protein from 01/24/18.

## 2018-02-17 NOTE — Progress Notes (Signed)
Patient will only receive avastin today per Dr. Alen Blew. Charge nurse Loren made aware.

## 2018-02-18 ENCOUNTER — Ambulatory Visit: Payer: PPO

## 2018-02-18 LAB — CA 125: CANCER ANTIGEN (CA) 125: 54.9 U/mL — AB (ref 0.0–38.1)

## 2018-02-19 DIAGNOSIS — G4733 Obstructive sleep apnea (adult) (pediatric): Secondary | ICD-10-CM | POA: Diagnosis not present

## 2018-02-21 ENCOUNTER — Ambulatory Visit: Payer: PPO | Admitting: Physician Assistant

## 2018-02-25 ENCOUNTER — Encounter: Payer: Self-pay | Admitting: Physician Assistant

## 2018-02-25 ENCOUNTER — Ambulatory Visit (INDEPENDENT_AMBULATORY_CARE_PROVIDER_SITE_OTHER): Payer: PPO | Admitting: Physician Assistant

## 2018-02-25 DIAGNOSIS — F319 Bipolar disorder, unspecified: Secondary | ICD-10-CM

## 2018-02-25 DIAGNOSIS — F422 Mixed obsessional thoughts and acts: Secondary | ICD-10-CM

## 2018-02-25 DIAGNOSIS — F329 Major depressive disorder, single episode, unspecified: Secondary | ICD-10-CM

## 2018-02-25 DIAGNOSIS — C569 Malignant neoplasm of unspecified ovary: Secondary | ICD-10-CM | POA: Diagnosis not present

## 2018-02-25 DIAGNOSIS — Z853 Personal history of malignant neoplasm of breast: Secondary | ICD-10-CM | POA: Diagnosis not present

## 2018-02-25 MED ORDER — SERTRALINE HCL 100 MG PO TABS: 100.0000 mg | ORAL_TABLET | Freq: Every day | ORAL | 1 refills | Status: AC

## 2018-02-25 NOTE — Progress Notes (Signed)
[facility] Med Check  Patient ID: [name],  MRN: [id number]  PCP: Billie Ruddy, MD  Date of Evaluation: 02/25/2018 Time spent:25 minutes  Chief Complaint:  Chief Complaint    Follow-up      HISTORY/CURRENT STATUS: HPI Here for 6 week med check.  Not doing well.  Things aren't going well with the cancer.  Is having to change chemo.  Tumor markers have gone up.  She's losing weight and feeling really weak.  She fell a few days ago and a neighbor came and helped her up.  She's very depressed about that. "I know nobody knows when they're going to die but I feel like this is going to be the year. It's been 5 years."  +sleeps all the time, has no energy or motivation, doesn't enjoy anything.  "I don't know what is the cancer or what's depression."  No anemia.   She still having obsessive thoughts.  They are especially bad when she thinks about her former therapist Will Elwyn Reach at [facility].  She saw him for approximately 5 years and has not been able to see him because he had health problems and decreased his caseload.  (See old notes.)  Patient denies increased energy with decreased need for sleep, no increased talkativeness, no racing thoughts, no impulsivity or risky behaviors, no increased spending, no increased libido, no grandiosity.  Anxiety is controlled. Hasn't been to church in the past few weeks, which is unusual.   Denies tics or tremors.  Individual Medical History/ Review of Systems: Changes? :Yes see above.  Past medications for mental health diagnoses include: Xanax, Lamictal, Depakote, l-carnitine, Latuda, symbyax, Zoloft, Pristiq, Paxil, Sonata, lithium, Lunesta  Allergies: Doxil [doxorubicin hcl liposomal]; Pollen extract; and Prednisone  Current Medications:  Current Outpatient Medications:  .  acetaminophen (TYLENOL) 325 MG tablet, Take 2 tablets (650 mg total) by mouth every 4 (four) hours as needed for mild pain, fever or headache., Disp: 30  tablet, Rfl: 0 .  acetaminophen (TYLENOL) 500 MG tablet, Take 500 mg by mouth every 6 (six) hours as needed for moderate pain., Disp: , Rfl:  .  Acetylcarnitine HCl (ACETYL L-CARNITINE PO), Take 2,000 mg by mouth at bedtime., Disp: , Rfl:  .  ALPRAZolam (XANAX) 0.5 MG tablet, Take 0.5 mg by mouth 2 (two) times daily as needed for anxiety., Disp: , Rfl:  .  AMBULATORY NON FORMULARY MEDICATION, Medication Name: Cardizem 2 %/Lidocaine 5% compounded. Apply small amount 1 inch  Inside Anus. 3-4 times daily until symptoms resolve., Disp: 30 g, Rfl: 1 .  atorvastatin (LIPITOR) 40 MG tablet, Take 1 tablet (40 mg total) by mouth daily., Disp: 90 tablet, Rfl: 2 .  carvedilol (COREG) 3.125 MG tablet, Take 1/2 tablet by mouth twice daily with meals., Disp: 30 tablet, Rfl: 11 .  clotrimazole-betamethasone (LOTRISONE) cream, Apply 1 application topically 2 (two) times daily., Disp: 30 g, Rfl: 1 .  divalproex (DEPAKOTE ER) 250 MG 24 hr tablet, Take 250 mg by mouth daily. , Disp: , Rfl:  .  divalproex (DEPAKOTE ER) 500 MG 24 hr tablet, Take 1,000 mg by mouth every evening. , Disp: , Rfl:  .  lamoTRIgine (LAMICTAL) 100 MG tablet, Take 100 mg by mouth 2 (two) times daily., Disp: , Rfl:  .  levothyroxine (SYNTHROID, LEVOTHROID) 100 MCG tablet, Take 1 tablet (100 mcg total) by mouth daily., Disp: 90 tablet, Rfl: 0 .  lidocaine-prilocaine (EMLA) cream, Apply topically as needed. Apply to port with every chemotherapy.,  Disp: 30 g, Rfl: 2 .  loperamide (IMODIUM A-D) 2 MG tablet, Take 2 mg by mouth as needed for diarrhea or loose stools., Disp: , Rfl:  .  loratadine (CLARITIN) 10 MG tablet, Take 10 mg by mouth daily as needed for allergies (with chemo)., Disp: , Rfl:  .  nitroGLYCERIN (NITROSTAT) 0.4 MG SL tablet, Place 1 tablet (0.4 mg total) under the tongue every 5 (five) minutes as needed for chest pain., Disp: 25 tablet, Rfl: 3 .  ondansetron (ZOFRAN ODT) 8 MG disintegrating tablet, Take 1 tablet (8 mg total) by mouth  every 8 (eight) hours as needed for nausea or vomiting., Disp: 20 tablet, Rfl: 1 .  ondansetron (ZOFRAN) 8 MG tablet, TAKE 1 TABLET BY MOUTH EVERY 8 HOURS AS NEEDED FOR NAUSEA AND VOMITING, Disp: 30 tablet, Rfl: 2 .  pantoprazole (PROTONIX) 40 MG tablet, Take 1 tablet (40 mg total) by mouth daily., Disp: 90 tablet, Rfl: 1 .  prochlorperazine (COMPAZINE) 10 MG tablet, Take 1 tablet (10 mg total) by mouth every 6 (six) hours as needed for nausea or vomiting., Disp: 30 tablet, Rfl: 0 .  rOPINIRole (REQUIP) 0.5 MG tablet, Take 3 tablets (1.5 mg total) by mouth at bedtime., Disp: 90 tablet, Rfl: 4 .  sertraline (ZOLOFT) 100 MG tablet, Take 1 tablet (100 mg total) by mouth daily., Disp: 90 tablet, Rfl: 1 No current facility-administered medications for this visit.   Facility-Administered Medications Ordered in Other Visits:  .  0.9 %  sodium chloride infusion, , Intravenous, Once, Shadad, Mathis Dad, MD .  heparin lock flush 100 unit/mL, 500 Units, Intravenous, Once, Shadad, Mathis Dad, MD .  sodium chloride flush (NS) 0.9 % injection 10 mL, 10 mL, Intravenous, PRN, Shadad, Mathis Dad, MD .  sodium chloride flush (NS) 0.9 % injection 10 mL, 10 mL, Intracatheter, PRN, Wyatt Portela, MD, 10 mL at 01/06/18 1349 Medication Side Effects: none  Family Medical/ Social History: Changes? No  MENTAL HEALTH EXAM:  There were no vitals taken for this visit.There is no height or weight on file to calculate BMI.  General Appearance: Casual and Well Groomed  Eye Contact:  Good  Speech:  Clear and Coherent  Volume:  Normal  Mood:  Depressed tearful but consolable  Affect:  Depressed  Thought Process:  Goal Directed  Orientation:  Full (Time, Place, and Person)  Thought Content: Logical   Suicidal Thoughts:  No  Homicidal Thoughts:  No  Memory:  WNL  Judgement:  Good  Insight:  Good  Psychomotor Activity:  Decreased uses cane  Concentration:  Concentration: Good  Recall:  Good  Fund of Knowledge: Good   Language: Good  Assets:  Desire for Improvement  ADL's:  Intact  Cognition: WNL  Prognosis:  Fair    DIAGNOSES:    ICD-10-CM   1. Mixed obsessional thoughts and acts F42.2   2. Bipolar I disorder ([place]) F31.9   3. Malignant neoplasm of ovary, unspecified laterality ([place]) C56.9   4. BREAST CANCER, HX OF Z85.3     Receiving Psychotherapy: No I recommended that she see 1 of our therapist here.    RECOMMENDATIONS: Will increase Zoloft 100 mg daily.  She knows to watch for any manic symptoms and let me know as soon as possible if they occur. Continue all other medications. Return in 4 weeks   Donnal Moat, [place]

## 2018-02-26 ENCOUNTER — Emergency Department (HOSPITAL_COMMUNITY): Payer: PPO

## 2018-02-26 ENCOUNTER — Encounter (HOSPITAL_COMMUNITY): Payer: Self-pay | Admitting: Emergency Medicine

## 2018-02-26 ENCOUNTER — Ambulatory Visit: Payer: PPO | Admitting: Physician Assistant

## 2018-02-26 ENCOUNTER — Emergency Department (HOSPITAL_COMMUNITY)
Admission: EM | Admit: 2018-02-26 | Discharge: 2018-02-26 | Disposition: A | Discharge status: Home / Self Care | Payer: PPO | Attending: Emergency Medicine | Admitting: Emergency Medicine

## 2018-02-26 DIAGNOSIS — S0003XA Contusion of scalp, initial encounter: Secondary | ICD-10-CM | POA: Diagnosis not present

## 2018-02-26 DIAGNOSIS — S0083XA Contusion of other part of head, initial encounter: Secondary | ICD-10-CM | POA: Diagnosis not present

## 2018-02-26 DIAGNOSIS — R5381 Other malaise: Secondary | ICD-10-CM | POA: Diagnosis not present

## 2018-02-26 DIAGNOSIS — I2510 Atherosclerotic heart disease of native coronary artery without angina pectoris: Secondary | ICD-10-CM | POA: Insufficient documentation

## 2018-02-26 DIAGNOSIS — Y92531 Health care provider office as the place of occurrence of the external cause: Secondary | ICD-10-CM | POA: Insufficient documentation

## 2018-02-26 DIAGNOSIS — W01198A Fall on same level from slipping, tripping and stumbling with subsequent striking against other object, initial encounter: Secondary | ICD-10-CM | POA: Insufficient documentation

## 2018-02-26 DIAGNOSIS — E039 Hypothyroidism, unspecified: Secondary | ICD-10-CM | POA: Diagnosis not present

## 2018-02-26 DIAGNOSIS — Y9389 Activity, other specified: Secondary | ICD-10-CM | POA: Diagnosis not present

## 2018-02-26 DIAGNOSIS — W19XXXA Unspecified fall, initial encounter: Secondary | ICD-10-CM | POA: Diagnosis not present

## 2018-02-26 DIAGNOSIS — D696 Thrombocytopenia, unspecified: Secondary | ICD-10-CM | POA: Diagnosis not present

## 2018-02-26 DIAGNOSIS — Y999 Unspecified external cause status: Secondary | ICD-10-CM | POA: Diagnosis not present

## 2018-02-26 DIAGNOSIS — Z79899 Other long term (current) drug therapy: Secondary | ICD-10-CM | POA: Diagnosis not present

## 2018-02-26 DIAGNOSIS — S0990XA Unspecified injury of head, initial encounter: Secondary | ICD-10-CM | POA: Diagnosis not present

## 2018-02-26 DIAGNOSIS — S064X0A Epidural hemorrhage without loss of consciousness, initial encounter: Secondary | ICD-10-CM | POA: Diagnosis not present

## 2018-02-26 LAB — CBC
HCT: 27.7 % — ABNORMAL LOW (ref 36.0–46.0)
Hemoglobin: 8 g/dL — ABNORMAL LOW (ref 12.0–15.0)
MCH: 32.3 pg (ref 26.0–34.0)
MCHC: 28.9 g/dL — ABNORMAL LOW (ref 30.0–36.0)
MCV: 111.7 fL — ABNORMAL HIGH (ref 80.0–100.0)
Platelets: 33 10*3/uL — ABNORMAL LOW (ref 150–400)
RBC: 2.48 MIL/uL — AB (ref 3.87–5.11)
RDW: 20 % — ABNORMAL HIGH (ref 11.5–15.5)
WBC: 3.8 10*3/uL — ABNORMAL LOW (ref 4.0–10.5)
nRBC: 0.5 % — ABNORMAL HIGH (ref 0.0–0.2)

## 2018-02-26 NOTE — ED Provider Notes (Signed)
[facility] DEPT Provider Note   CSN: [id number] Arrival date & time: 02/26/18  1356     History   Chief Complaint Chief Complaint  Patient presents with  . Fall  . Head Injury    HPI [name] is a 63 y.o. female.  HPI Patient presents after fall.  Actually had 2 falls one at home and 1 at the doctor's office.  History comes from patient and her husband.  Took an extra Xanax today because she had to go into the doctor's office and was going to have an exam for a fissure.  Was a little unsteady because of this.  Has taken 2 Xanax for procedures in the past but can get a little loopy when she takes 2.  [place] while attempting to get in a wheelchair and hit her face.  No loss consciousness.  She has a history of cancer and has a chronic thrombocytopenia with platelets of that of been in the 40s recently.  No neck pain.  No chest or abdominal pain.  Has some mild confusion now but states that was from the Xanax and was present before the fall. Past Medical History:  Diagnosis Date  . ADD (attention deficit disorder)   . Allergy    seasonal  . Anxiety   . Bipolar disorder ([place])   . Breast cancer ([place]) [id number]   right breast, left breast  . Bronchitis 10/26/2017  . Cancer ([place])    Omentum  . Coronary artery disease   . Depression   . Hematochezia   . History of echocardiogram    a. Limited Echo 9/17: EF 60-65%, no RWMA, Gr 1 DD, trivial AI, trivial MR, GLS -12% (likely underestimated), no pericardial effusion  . Hyperlipidemia   . Hypothyroidism   . Maintenance chemotherapy    Pt has chemo every 3 weeks (on Friday)  . NSTEMI (non-ST elevated myocardial infarction) ([place]) 10/26/2015  . Osteopenia 03/2009   t score -2.1 FRAX 4.6/0.4  . Personal history of chemotherapy   . Personal history of radiation therapy   . Premature menopause   . Sleep apnea    mild  . Urinary incontinence     Patient Active Problem List   Diagnosis Date Noted    . OCD (obsessive compulsive disorder) 12/24/2017  . Insomnia 12/24/2017  . Ovarian cancer ([place]) 10/29/2017  . Need for prophylactic vaccination and inoculation against influenza 12/10/2016  . Encounter for antineoplastic chemotherapy 08/29/2016  . RLS (restless legs syndrome) 04/23/2016  . Nausea and vomiting in adult 02/09/2016  . Cellulitis of right breast 01/04/2016  . GERD (gastroesophageal reflux disease) 12/09/2015  . Presence of drug coated stent in LAD coronary artery 11/08/2015  . Post-infarction angina ([place]) 11/08/2015  . Thrombocytopenia ([place]) 11/04/2015  . Coronary artery disease involving native heart without angina pectoris 11/03/2015  . NSTEMI (non-ST elevated myocardial infarction) ([place])   . Pain in the chest 10/25/2015  . Pancytopenia ([place]) 10/25/2015  . Elevated troponin 10/25/2015  . Chest wall pain 07/15/2015  . Genetic testing 07/04/2015  . Port-A-Cath in place 06/16/2015  . Dizziness 03/29/2015  . 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 Malignant neoplasm of female breast ([place]) 09/19/2012  . Lithium use 09/17/2011  . Abnormal coordination 09/17/2011  . Premature menopause   . Depression   . Anxiety   . Bipolar disorder ([place])   . ADD (attention deficit disorder)   . NONSPECIFIC ABNORMAL ELECTROCARDIOGRAM 11/04/2009  . ALLERGIC RHINITIS 09/01/2009  .  NONSPEC ELEVATION OF LEVELS OF TRANSAMINASE/LDH 07/28/2008  . MOTOR RESTLESSNESS 02/19/2008  . Internal hemorrhoids without mention of complication 40/98/1191  . ESOPHAGEAL STRICTURE 09/15/2007  . Obstructive sleep apnea 07/21/2007  . Hyperlipidemia 07/07/2007  . Osteopenia 02/20/2007  . Hypothyroidism 10/01/2006  . BREAST CANCER, HX OF 08/12/2006    Past Surgical History:  Procedure Laterality Date  . BREAST BIOPSY Left 07/07/2015   malignant x 2  . BREAST LUMPECTOMY Right 1989  . BREAST SURGERY  1989   RIGHT LUMPECTOMY, RADIATION AND CHEMO  . CARDIAC CATHETERIZATION N/A 10/26/2015    Procedure: Left Heart Cath and Coronary Angiography;  Surgeon: Wellington Hampshire, MD;  Location: [facility];  Service: Cardiovascular;  Laterality: N/A;  . CARDIAC CATHETERIZATION N/A 10/26/2015   Procedure: Coronary Stent Intervention;  Surgeon: Wellington Hampshire, MD;  Location: [facility];  Service: Cardiovascular;  Laterality: N/A;  . CARDIAC CATHETERIZATION N/A 11/08/2015   Procedure: Left Heart Cath and Coronary Angiography;  Surgeon: Jolaine Artist, MD;  Location: [facility];  Service: Cardiovascular;  Laterality: N/A;  . drug eluting stent  10/26/2015  . FOOT SURGERY  2013   BILATERAL   . HYSTEROSCOPY  2011   [place]  . PELVIC LAPAROSCOPY/ Hysteroscopy  1996     OB History    Gravida  0   Para      Term      Preterm      AB      Living        SAB      TAB      Ectopic      Multiple      Live Births               Home Medications    Prior to Admission medications   Medication Sig Start Date End Date Taking? Authorizing Provider  acetaminophen (TYLENOL) 500 MG tablet Take 500 mg by mouth every 6 (six) hours as needed for moderate pain.   Yes [provider]  ALPRAZolam Duanne Moron) 0.5 MG tablet Take 0.5 mg by mouth 2 (two) times daily as needed for anxiety.   Yes [provider]  AMBULATORY NON FORMULARY MEDICATION Medication Name: Cardizem 2 %/Lidocaine 5% compounded. Apply small amount 1 inch  Inside Anus. 3-4 times daily until symptoms resolve. 12/18/17  Yes Ester[place], Amy S, PA-C  atorvastatin (LIPITOR) 40 MG tablet Take 1 tablet (40 mg total) by mouth daily. 04/02/17  Yes [name], [name], MD  carvedilol (COREG) 3.125 MG tablet Take 1/2 tablet by mouth twice daily with meals. Patient taking differently: Take 1.56 mg by mouth 2 (two) times daily with a meal.  01/10/18  Yes Fay Records, MD  clotrimazole-betamethasone (LOTRISONE) cream Apply 1 application topically 2 (two) times daily. Patient taking differently: Apply 1  application topically 2 (two) times daily as needed (ring worm).  06/25/17  Yes [name], [name], MD  divalproex (DEPAKOTE ER) 250 MG 24 hr tablet Take 250 mg by mouth daily.  11/10/17  Yes [provider]  divalproex (DEPAKOTE ER) 500 MG 24 hr tablet Take 1,000 mg by mouth every evening.  11/10/17  Yes [provider]  docusate sodium (COLACE) 100 MG capsule Take 100 mg by mouth daily as needed for mild constipation.   Yes [provider]  lamoTRIgine (LAMICTAL) 100 MG tablet Take 100 mg by mouth 2 (two) times daily.   Yes [provider]  levothyroxine (SYNTHROID, [place])  100 MCG tablet Take 1 tablet (100 mcg total) by mouth daily. 02/11/18  Yes Nafziger, Tommi Rumps, NP  lidocaine-prilocaine (EMLA) cream Apply topically as needed. Apply to port with every chemotherapy. 06/17/17  Yes Wyatt Portela, MD  loperamide (IMODIUM A-D) 2 MG tablet Take 2 mg by mouth as needed for diarrhea or loose stools.   Yes [provider]  loratadine (CLARITIN) 10 MG tablet Take 10 mg by mouth daily as needed (with chemo).    Yes [provider]  nitroGLYCERIN (NITROSTAT) 0.4 MG SL tablet Place 1 tablet (0.4 mg total) under the tongue every 5 (five) minutes as needed for chest pain. 11/24/15  Yes Fay Records, MD  ondansetron (ZOFRAN) 8 MG tablet TAKE 1 TABLET BY MOUTH EVERY 8 HOURS AS NEEDED FOR NAUSEA AND VOMITING Patient taking differently: Take 8 mg by mouth every 8 (eight) hours as needed for nausea or vomiting.  01/02/16  Yes Wyatt Portela, MD  pantoprazole (PROTONIX) 40 MG tablet Take 1 tablet (40 mg total) by mouth daily. Patient taking differently: Take 40 mg by mouth at bedtime.  01/10/18  Yes Billie Ruddy, MD  prochlorperazine (COMPAZINE) 10 MG tablet Take 1 tablet (10 mg total) by mouth every 6 (six) hours as needed for nausea or vomiting. 02/23/16  Yes Wyatt Portela, MD  rOPINIRole (REQUIP) 0.5 MG tablet Take 3 tablets (1.5 mg total) by mouth at bedtime.  10/18/17  Yes [name], [name], MD  sertraline (ZOLOFT) 100 MG tablet Take 1 tablet (100 mg total) by mouth daily. 02/25/18  Yes Hurst, Dorothea Glassman, PA-C  acetaminophen (TYLENOL) 325 MG tablet Take 2 tablets (650 mg total) by mouth every 4 (four) hours as needed for mild pain, fever or headache. Patient not taking: Reported on 02/26/2018 01/11/16   Robbie Lis, MD  ondansetron (ZOFRAN ODT) 8 MG disintegrating tablet Take 1 tablet (8 mg total) by mouth every 8 (eight) hours as needed for nausea or vomiting. Patient not taking: Reported on 02/26/2018 12/16/17   Wyatt Portela, MD    Family History Family History  Problem Relation Age of Onset  . Breast cancer Maternal Aunt 29  . Diabetes Maternal Grandmother   . Heart disease Maternal Grandmother   . Heart disease Maternal Grandfather   . Hypertension Paternal Grandfather   . Heart Problems Paternal Grandfather   . Breast cancer Maternal Aunt        dx. early 60s; had negative GT approx 10 years ago  . Breast cancer Maternal Aunt        dx. 64s with recurrence  . Allergies Father   . Heart disease Mother   . Other Mother        hx of hysterectomy   . Liver cancer Cousin 17       +EtOH  . Cancer Cousin        paternal 1st cousin, once-removed dx. NOS cancer (maybe ovarian) in late 30s-40s  . Cancer Cousin        female paternal 2nd cousin d. of NOS cancer in her 20s-early 38s  . Ovarian cancer Neg Hx   . Colon cancer Neg Hx     Social History Social History   Tobacco Use  . Smoking status: Never Smoker  . Smokeless tobacco: Never Used  Substance Use Topics  . Alcohol use: Yes    Alcohol/week: 0.0 standard drinks    Comment: very rarely - maybe 1 glass wine every 3 mos  .  Drug use: No     Allergies   Doxil [doxorubicin hcl liposomal]; Pollen extract; and Prednisone   Review of Systems Review of Systems  Constitutional: Negative for appetite change.  Respiratory: Negative for shortness of breath.   Cardiovascular:  Negative for chest pain.  Gastrointestinal: Negative for abdominal pain.  Genitourinary: Negative for flank pain.       Has anal fissure  Musculoskeletal: Negative for back pain.  Skin: Negative for rash.  Hematological: Bruises/bleeds easily.     Physical Exam Updated Vital Signs BP 101/65   Pulse 63   Temp 98.4 F (36.9 C)   Resp 16   SpO2 95%   Physical Exam HENT:     Head:     Comments: Hematoma below right eye.  Eye movement intact.  Hematoma above left eye.  Left eye movements intact.  No cervical spine tenderness. Neck:     Musculoskeletal: Neck supple.  Cardiovascular:     Rate and Rhythm: Normal rate and regular rhythm.  Pulmonary:     Breath sounds: Normal breath sounds.  Abdominal:     Tenderness: There is no abdominal tenderness.  Musculoskeletal:        General: No signs of injury.  Skin:    General: Skin is warm.     Capillary Refill: Capillary refill takes less than 2 seconds.  Neurological:     General: No focal deficit present.     Mental Status: She is alert.      ED Treatments / Results  Labs (all labs ordered are listed, but only abnormal results are displayed) Labs Reviewed  CBC - Abnormal; Notable for the following components:      Result Value   WBC 3.8 (*)    RBC 2.48 (*)    Hemoglobin 8.0 (*)    HCT 27.7 (*)    MCV 111.7 (*)    MCHC 28.9 (*)    RDW 20.0 (*)    Platelets 33 (*)    nRBC 0.5 (*)    All other components within normal limits    EKG None  Radiology Ct Head Wo Contrast  Result Date: 02/26/2018 CLINICAL DATA:  Facial trauma EXAM: CT HEAD WITHOUT CONTRAST CT MAXILLOFACIAL WITHOUT CONTRAST TECHNIQUE: Multidetector CT imaging of the head and maxillofacial structures were performed using the standard protocol without intravenous contrast. Multiplanar CT image reconstructions of the maxillofacial structures were also generated. COMPARISON:  CT head and cervical spine 01/21/2015 FINDINGS: CT HEAD FINDINGS Brain: Generalized  atrophy most prominent in the frontal lobes. Chronic white matter changes. Negative for acute infarct, hemorrhage, or mass. Vascular: Negative for hyperdense vessel Skull: Negative for skull fracture. Left frontal scalp hematoma Sinuses/Orbits: Mucosal edema in the sphenoid sinus. Mild mucosal edema in the ethmoid sinuses bilaterally. Normal orbit. Other: None CT MAXILLOFACIAL FINDINGS Osseous: Negative for facial fracture. Orbits: No orbital mass or edema. Sinuses: Retention cyst left maxillary sinus. Mucosal edema throughout the paranasal sinuses. Retention cyst in the sphenoid sinus. Small air-fluid level left mastoid tip. Right mastoid sinus clear. Soft tissues: Left frontal scalp hematoma.  No associated fracture. IMPRESSION: Negative for facial fracture.  Left frontal scalp hematoma. Electronically Signed   By: Franchot Gallo M.D.   On: 02/26/2018 17:51   Ct Maxillofacial Wo Contrast  Result Date: 02/26/2018 CLINICAL DATA:  Facial trauma EXAM: CT HEAD WITHOUT CONTRAST CT MAXILLOFACIAL WITHOUT CONTRAST TECHNIQUE: Multidetector CT imaging of the head and maxillofacial structures were performed using the standard protocol without intravenous contrast. Multiplanar CT  image reconstructions of the maxillofacial structures were also generated. COMPARISON:  CT head and cervical spine 01/21/2015 FINDINGS: CT HEAD FINDINGS Brain: Generalized atrophy most prominent in the frontal lobes. Chronic white matter changes. Negative for acute infarct, hemorrhage, or mass. Vascular: Negative for hyperdense vessel Skull: Negative for skull fracture. Left frontal scalp hematoma Sinuses/Orbits: Mucosal edema in the sphenoid sinus. Mild mucosal edema in the ethmoid sinuses bilaterally. Normal orbit. Other: None CT MAXILLOFACIAL FINDINGS Osseous: Negative for facial fracture. Orbits: No orbital mass or edema. Sinuses: Retention cyst left maxillary sinus. Mucosal edema throughout the paranasal sinuses. Retention cyst in the sphenoid  sinus. Small air-fluid level left mastoid tip. Right mastoid sinus clear. Soft tissues: Left frontal scalp hematoma.  No associated fracture. IMPRESSION: Negative for facial fracture.  Left frontal scalp hematoma. Electronically Signed   By: Franchot Gallo M.D.   On: 02/26/2018 17:51    Procedures Procedures (including critical care time)  Medications Ordered in ED Medications - No data to display   Initial Impression / Assessment and Plan / ED Course  I have reviewed the triage vital signs and the nursing notes.  Pertinent labs & imaging results that were available during my care of the patient were reviewed by me and considered in my medical decision making (see chart for details).      patient with fall.  Hematoma on forehead.  CT scan reassuring but does have by thrombocytopenia.  Discussed with patient and family about precautions of what to watch for for delayed bleed.  I think it is reasonable for discharge since her hospital bed with her.  Discharge home.  Final Clinical Impressions(s) / ED Diagnoses   Final diagnoses:  Injury of head, initial encounter  Fall, initial encounter  Facial hematoma, initial encounter  Thrombocytopenia [facility])    ED Discharge Orders    None       Davonna Belling, MD 02/26/18 1930

## 2018-02-26 NOTE — ED Triage Notes (Signed)
Pt arrives via EMS from PCP. Pt had a fall at the PCP. Pt denies LOC. Denies neck or back pain. Pt is not on any blood thinners. Pt has hematoma to left forehead.

## 2018-03-03 DIAGNOSIS — G4733 Obstructive sleep apnea (adult) (pediatric): Secondary | ICD-10-CM | POA: Diagnosis not present

## 2018-03-03 IMAGING — CT CT ABD-PELV W/ CM
2 of 5 series · 14 of 46 positions shown, 16 images · IV contrast (iopamidol)
Comparison: CT of the chest, abdomen and pelvis 05/05/2015.

CLINICAL DATA: 59-year-old female with history of ovarian cancer
with metastatic disease to the omentum originally diagnosed in 4670.
Additional history of breast cancer status post right-sided
lumpectomy. Undergoing ongoing chemotherapy. Known metastatic
disease to the omentum.

EXAM:
CT CHEST, ABDOMEN, AND PELVIS WITH CONTRAST
TECHNIQUE: Multidetector CT imaging of the chest, abdomen and pelvis was
performed following the standard protocol during bolus
administration of intravenous contrast.
CONTRAST:  100mL 9OZ2F4-OZZ IOPAMIDOL (9OZ2F4-OZZ) INJECTION 61%

[Series 2: cap with st · axial · 0.71mm/px · z∈[-579,-104]mm · 11 of 115 slices shown, 13 images]
[im 10/115  soft-tissue]
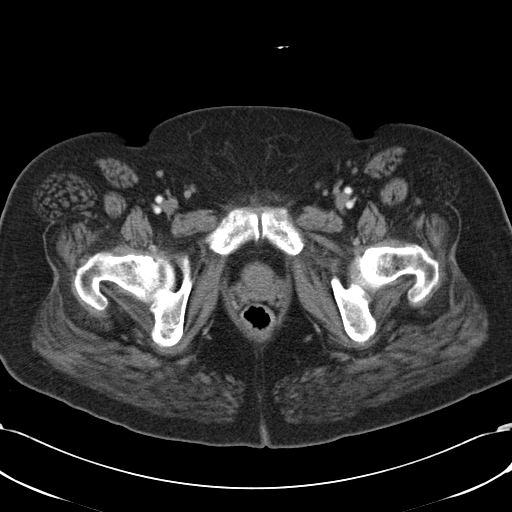
[im 10/115  bone]
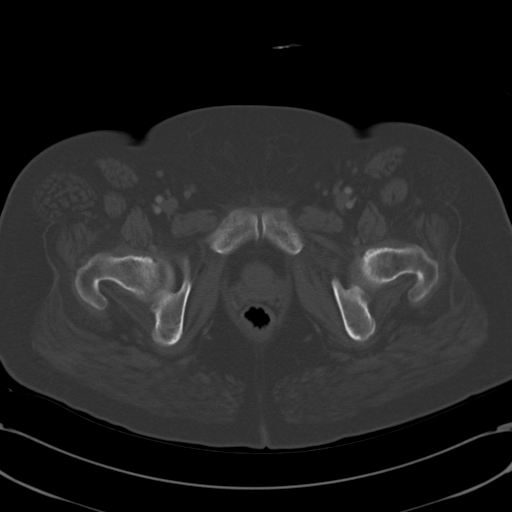
[im 20/115  soft-tissue]
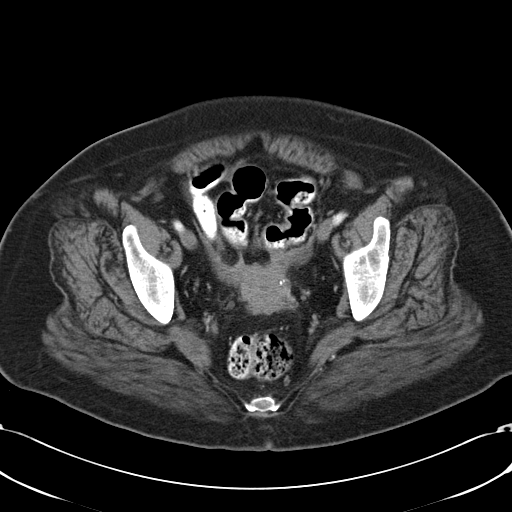
[im 29/115  soft-tissue]
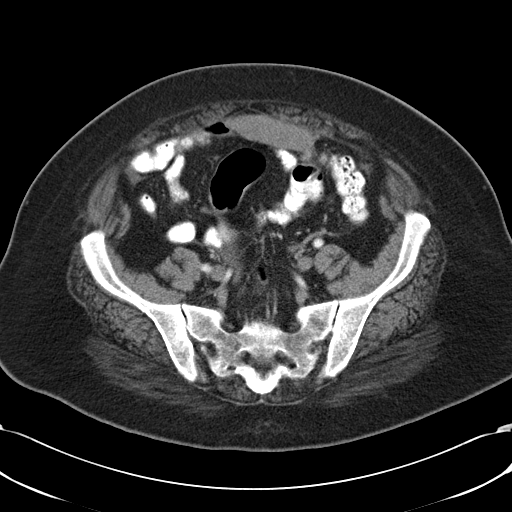
[im 39/115  soft-tissue]
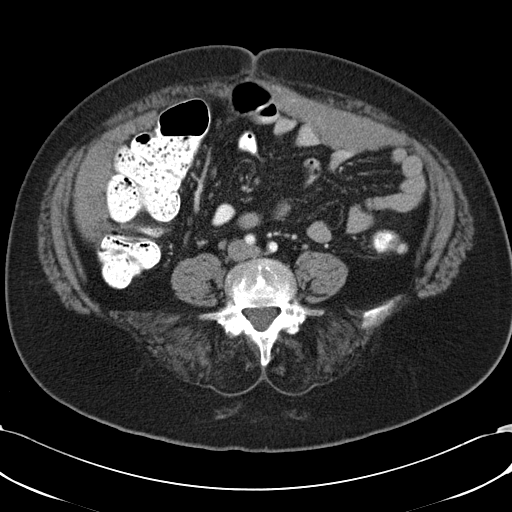
[im 48/115  soft-tissue]
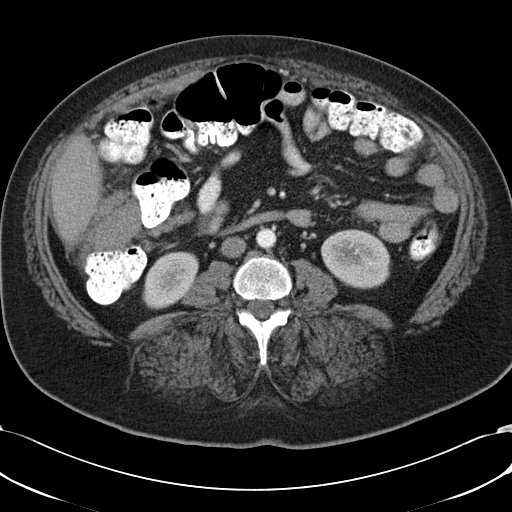
[im 58/115  soft-tissue]
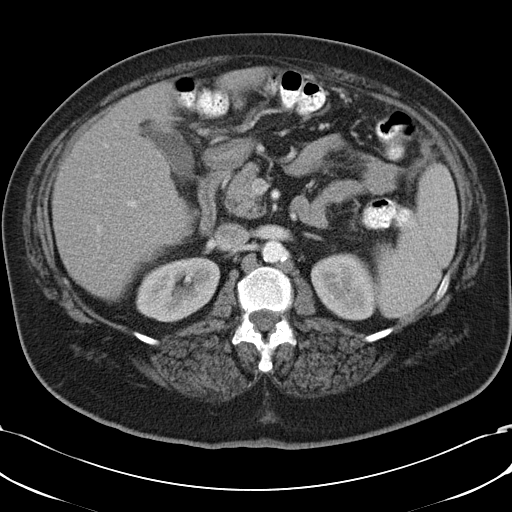
[im 67/115  soft-tissue]
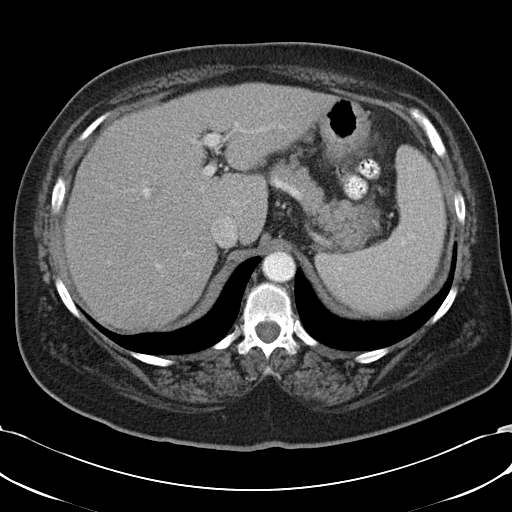
[im 77/115  soft-tissue]
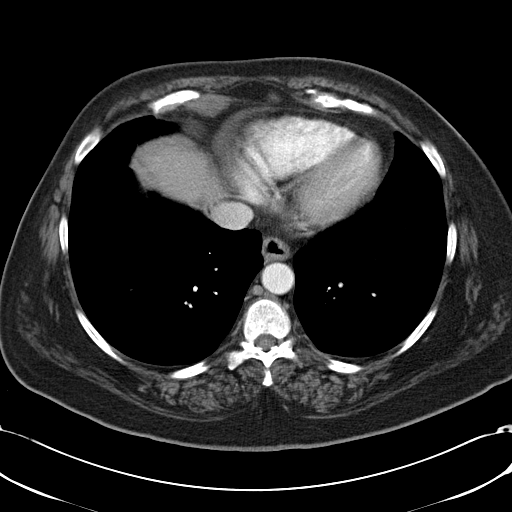
[im 86/115  soft-tissue]
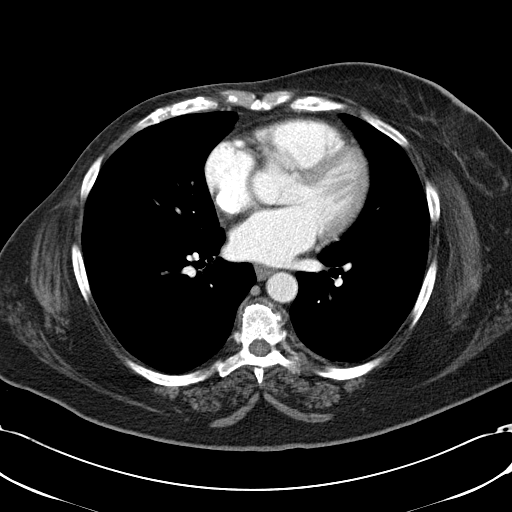
[im 86/115  bone]
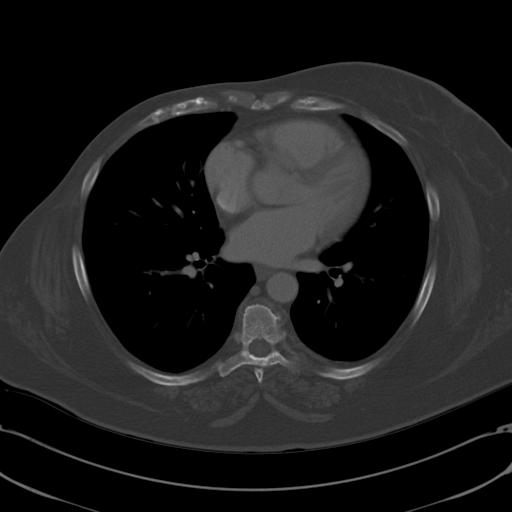
[im 96/115  soft-tissue]
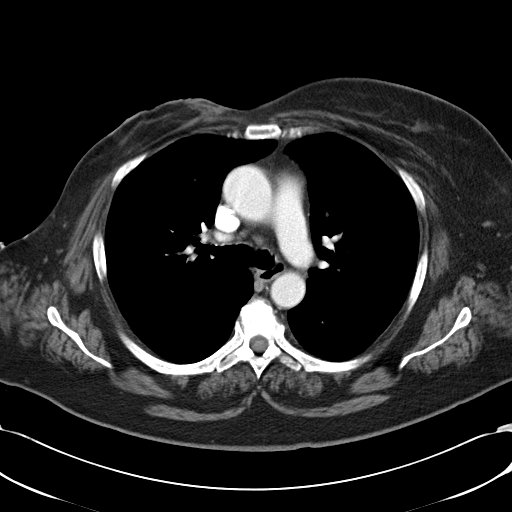
[im 105/115  soft-tissue]
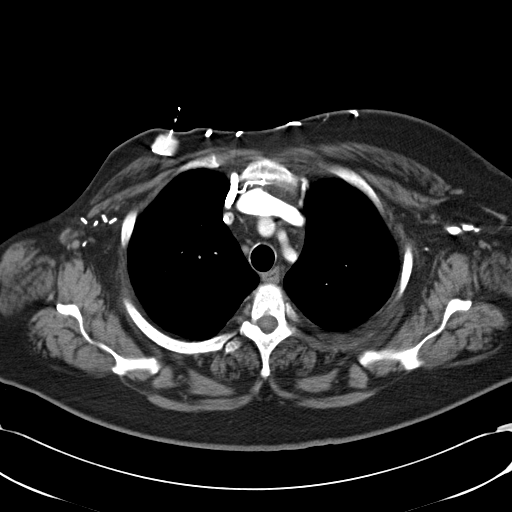

[Series 602: cor · coronal · 1.12mm/px · 3 of 136 slices shown]
[im 46/136  soft-tissue]
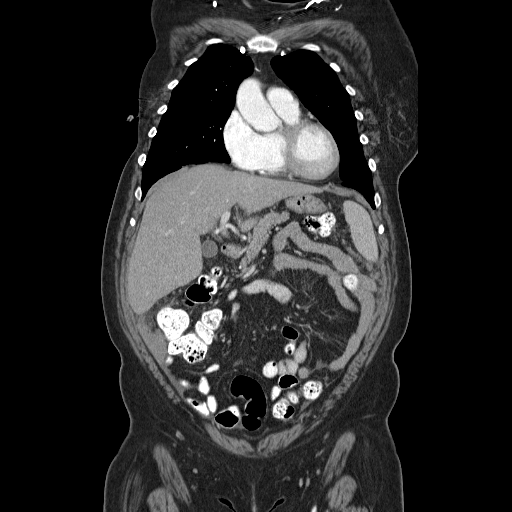
[im 61/136  soft-tissue]
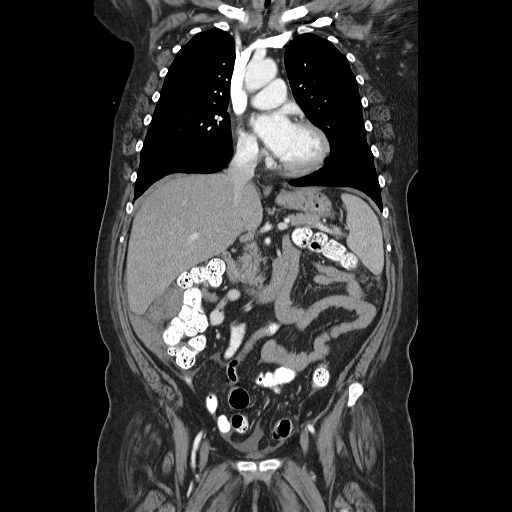
[im 76/136  soft-tissue]
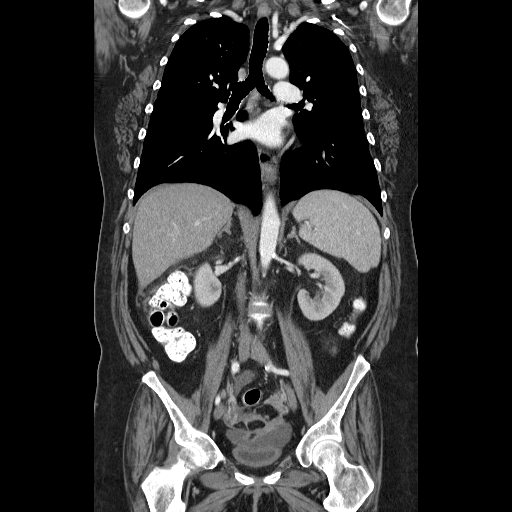

[14 of 46 positions shown; findings below may reference images not displayed]

FINDINGS: CT CHEST FINDINGS

Cardiovascular: Heart size is normal. There is no significant
pericardial fluid, thickening or pericardial calcification. There is
aortic atherosclerosis, as well as atherosclerosis of the great
vessels of the mediastinum and the coronary arteries, including
calcified atherosclerotic plaque in the left main coronary artery.
Right internal jugular single-lumen porta cath with tip terminating
in the distal superior vena cava.

Mediastinum/Nodes: No pathologically enlarged mediastinal, hilar or
internal mammary lymph nodes. Esophagus is unremarkable in
appearance. No axillary lymphadenopathy.

Lungs/Pleura: No suspicious appearing pulmonary nodules or masses.
No acute consolidative airspace disease. No pleural effusions.

Musculoskeletal: Status post right modified radical mastectomy and
right axillary lymph node dissection. There are no aggressive
appearing lytic or blastic lesions noted in the visualized portions
of the skeleton.

CT ABDOMEN PELVIS FINDINGS

Hepatobiliary: Liver has a shrunken appearance and nodular contour,
suggestive of early cirrhosis. No definite cystic or solid hepatic
lesions are noted. No intra or extrahepatic biliary ductal
dilatation. Gallbladder is normal in appearance.

Pancreas: No pancreatic mass. No pancreatic ductal dilatation. No
pancreatic or peripancreatic fluid or inflammatory changes.

Spleen: Spleen is mildly enlarged measuring 13.0 x 5.2 x 9.8 cm
(estimated splenic volume of 336 mL).

Adrenals/Urinary Tract: The appearance of the kidneys and adrenal
glands is normal bilaterally. No hydroureteronephrosis. Urinary
bladder is nearly decompressed, but otherwise unremarkable in
appearance.

Stomach/Bowel: Normal appearance of the stomach. No pathologic
dilatation of small bowel or colon. Appendix is not confidently
identified may be surgically absent.

Vascular/Lymphatic: Aortic atherosclerosis, without evidence of
aneurysm or dissection in the abdominal or pelvic vasculature.
Recannulized paraumbilical vein. Portal vein is dilated measuring up
to 16 mm in diameter. No lymphadenopathy noted in the abdomen or
pelvis.

Reproductive: Uterus and ovaries are unremarkable in appearance.

Other: Bulky intraperitoneal metastatic disease is again noted. The
largest of these metastatic lesions are associated with the omentum,
and appear generally stable compared to the prior examination. The
previously referenced lesion in the anterior aspect of the abdomen
slightly to the left of midline currently measures 11.9 x 2.5 cm
(image 81 of series 2). The largest lesion in the right subhepatic
region currently measures 8.6 x 4.0 cm (image 74 of series 2).
Several other smaller lesions are also noted, some of which appear
smaller than the prior examination, while others appear larger.
Overall burden of disease is very similar to the prior study. Trace
volume of ascites, presumably malignant. No pneumoperitoneum.

Musculoskeletal: There are no aggressive appearing lytic or blastic
lesions noted in the visualized portions of the skeleton.
IMPRESSION: 1. Overall, the widespread intraperitoneal metastatic disease
appears essentially stable compared to the prior examination, as
discussed above.
2. No new metastatic disease in the thorax or in the solid or hollow
viscera of the abdomen and pelvis.
3. Stigmata of cirrhosis and portal hypertension redemonstrated,
including mild splenomegaly.
4. Additional incidental findings, as above.

## 2018-03-05 ENCOUNTER — Telehealth: Payer: Self-pay | Admitting: *Deleted

## 2018-03-05 NOTE — Telephone Encounter (Signed)
Spoke with patient re watery diarrhea and nausea x 3 weeks. Weight down to 126 lbs. Instructed her to use zofran in am and imodium as needed. Both medications are on her list. She has not taken either of these. She will call back tomorrow if no improvement. Instructed to keep her regularly scheduled appt 03/10/18 with dr Alen Blew.

## 2018-03-05 NOTE — Telephone Encounter (Signed)
Attempted to call patient. LM to call dr Hazeline Junker desk nurse.

## 2018-03-07 ENCOUNTER — Encounter (HOSPITAL_COMMUNITY): Payer: Self-pay

## 2018-03-07 ENCOUNTER — Ambulatory Visit (HOSPITAL_COMMUNITY)
Admission: RE | Admit: 2018-03-07 | Discharge: 2018-03-07 | Disposition: A | Discharge status: Home / Self Care | Payer: PPO | Source: Ambulatory Visit | Attending: Oncology | Admitting: Oncology

## 2018-03-07 DIAGNOSIS — C50919 Malignant neoplasm of unspecified site of unspecified female breast: Secondary | ICD-10-CM | POA: Diagnosis not present

## 2018-03-07 DIAGNOSIS — C482 Malignant neoplasm of peritoneum, unspecified: Secondary | ICD-10-CM | POA: Diagnosis not present

## 2018-03-07 DIAGNOSIS — R918 Other nonspecific abnormal finding of lung field: Secondary | ICD-10-CM | POA: Diagnosis not present

## 2018-03-07 MED ORDER — IOHEXOL 300 MG/ML  SOLN
100.0000 mL | Freq: Once | INTRAMUSCULAR | Status: AC | PRN
  Administered 2018-03-07: 100 mL via INTRAVENOUS

## 2018-03-07 MED ORDER — IOHEXOL 300 MG/ML  SOLN
30.0000 mL | Freq: Once | INTRAMUSCULAR | Status: AC | PRN
  Administered 2018-03-07: 30 mL via ORAL

## 2018-03-07 MED ORDER — SODIUM CHLORIDE (PF) 0.9 % IJ SOLN
INTRAMUSCULAR | Status: AC
  Filled 2018-03-07: qty 50

## 2018-03-10 ENCOUNTER — Inpatient Hospital Stay: Payer: PPO

## 2018-03-10 ENCOUNTER — Inpatient Hospital Stay: Payer: PPO | Attending: Oncology | Admitting: Oncology

## 2018-03-10 ENCOUNTER — Telehealth: Payer: Self-pay | Admitting: Oncology

## 2018-03-10 VITALS — BP 128/78 | HR 60 | Temp 97.6°F | Resp 18 | Ht 60.75 in | Wt 128.9 lb

## 2018-03-10 DIAGNOSIS — C786 Secondary malignant neoplasm of retroperitoneum and peritoneum: Secondary | ICD-10-CM | POA: Diagnosis not present

## 2018-03-10 DIAGNOSIS — R634 Abnormal weight loss: Secondary | ICD-10-CM | POA: Diagnosis not present

## 2018-03-10 DIAGNOSIS — Z5112 Encounter for antineoplastic immunotherapy: Secondary | ICD-10-CM | POA: Insufficient documentation

## 2018-03-10 DIAGNOSIS — Z79899 Other long term (current) drug therapy: Secondary | ICD-10-CM

## 2018-03-10 DIAGNOSIS — D6481 Anemia due to antineoplastic chemotherapy: Secondary | ICD-10-CM | POA: Diagnosis not present

## 2018-03-10 DIAGNOSIS — R5383 Other fatigue: Secondary | ICD-10-CM | POA: Insufficient documentation

## 2018-03-10 DIAGNOSIS — C569 Malignant neoplasm of unspecified ovary: Secondary | ICD-10-CM

## 2018-03-10 DIAGNOSIS — Z170 Estrogen receptor positive status [ER+]: Secondary | ICD-10-CM | POA: Diagnosis not present

## 2018-03-10 DIAGNOSIS — C50912 Malignant neoplasm of unspecified site of left female breast: Secondary | ICD-10-CM

## 2018-03-10 DIAGNOSIS — R630 Anorexia: Secondary | ICD-10-CM | POA: Insufficient documentation

## 2018-03-10 DIAGNOSIS — R110 Nausea: Secondary | ICD-10-CM | POA: Insufficient documentation

## 2018-03-10 DIAGNOSIS — R531 Weakness: Secondary | ICD-10-CM | POA: Insufficient documentation

## 2018-03-10 DIAGNOSIS — C482 Malignant neoplasm of peritoneum, unspecified: Secondary | ICD-10-CM

## 2018-03-10 DIAGNOSIS — D701 Agranulocytosis secondary to cancer chemotherapy: Secondary | ICD-10-CM | POA: Diagnosis not present

## 2018-03-10 DIAGNOSIS — Z95828 Presence of other vascular implants and grafts: Secondary | ICD-10-CM

## 2018-03-10 LAB — CBC WITH DIFFERENTIAL (CANCER CENTER ONLY)
Abs Immature Granulocytes: 0.08 10*3/uL — ABNORMAL HIGH (ref 0.00–0.07)
BASOS ABS: 0 10*3/uL (ref 0.0–0.1)
Basophils Relative: 0 %
EOS PCT: 0 %
Eosinophils Absolute: 0 10*3/uL (ref 0.0–0.5)
HCT: 29.3 % — ABNORMAL LOW (ref 36.0–46.0)
Hemoglobin: 8.6 g/dL — ABNORMAL LOW (ref 12.0–15.0)
Immature Granulocytes: 2 %
Lymphocytes Relative: 23 %
Lymphs Abs: 0.9 10*3/uL (ref 0.7–4.0)
MCH: 30.6 pg (ref 26.0–34.0)
MCHC: 29.4 g/dL — AB (ref 30.0–36.0)
MCV: 104.3 fL — ABNORMAL HIGH (ref 80.0–100.0)
MONO ABS: 0.4 10*3/uL (ref 0.1–1.0)
Monocytes Relative: 10 %
NRBC: 0.5 % — AB (ref 0.0–0.2)
Neutro Abs: 2.7 10*3/uL (ref 1.7–7.7)
Neutrophils Relative %: 65 %
Platelet Count: 67 10*3/uL — ABNORMAL LOW (ref 150–400)
RBC: 2.81 MIL/uL — ABNORMAL LOW (ref 3.87–5.11)
RDW: 19.9 % — ABNORMAL HIGH (ref 11.5–15.5)
WBC: 4.2 10*3/uL (ref 4.0–10.5)

## 2018-03-10 LAB — CMP (CANCER CENTER ONLY)
ALT: <6 U/L (ref 0–44)
AST: 15 U/L (ref 15–41)
Albumin: 3 g/dL — ABNORMAL LOW (ref 3.5–5.0)
Alkaline Phosphatase: 70 U/L (ref 38–126)
Anion gap: 7 (ref 5–15)
BUN: 12 mg/dL (ref 6–20)
CO2: 27 mmol/L (ref 22–32)
Calcium: 8.4 mg/dL — ABNORMAL LOW (ref 8.9–10.3)
Chloride: 106 mmol/L (ref 98–111)
Creatinine: 0.74 mg/dL (ref 0.44–1.00)
GFR, Est AFR Am: >60 mL/min (ref >60)
Glucose, Bld: 87 mg/dL (ref 70–99)
Potassium: 3.9 mmol/L (ref 3.5–5.1)
Sodium: 140 mmol/L (ref 135–145)
Total Bilirubin: 0.8 mg/dL (ref 0.3–1.2)
Total Protein: 7.1 g/dL (ref 6.5–8.1)

## 2018-03-10 MED ORDER — SODIUM CHLORIDE 0.9 % IV SOLN
900.0000 mg | Freq: Once | INTRAVENOUS | Status: AC
  Administered 2018-03-10: 900 mg via INTRAVENOUS
  Filled 2018-03-10: qty 32

## 2018-03-10 MED ORDER — SODIUM CHLORIDE 0.9 % IV SOLN
Freq: Once | INTRAVENOUS | Status: AC
  Administered 2018-03-10: 11:00:00 via INTRAVENOUS
  Filled 2018-03-10: qty 250

## 2018-03-10 MED ORDER — HEPARIN SOD (PORK) LOCK FLUSH 100 UNIT/ML IV SOLN
500.0000 [IU] | Freq: Once | INTRAVENOUS | Status: AC | PRN
  Administered 2018-03-10: 500 [IU]
  Filled 2018-03-10: qty 5

## 2018-03-10 MED ORDER — SODIUM CHLORIDE 0.9% FLUSH
10.0000 mL | INTRAVENOUS | Status: DC | PRN
  Administered 2018-03-10: 10 mL
  Filled 2018-03-10: qty 10

## 2018-03-10 NOTE — Patient Instructions (Signed)
[facility] Discharge Instructions for Patients Receiving Chemotherapy  Today you received the following chemotherapy agents Avastin   To help prevent nausea and vomiting after your treatment, we encourage you to take your nausea medication as directed.    If you develop nausea and vomiting that is not controlled by your nausea medication, call the clinic.   BELOW ARE SYMPTOMS THAT SHOULD BE REPORTED IMMEDIATELY:  *FEVER GREATER THAN 100.5 F  *CHILLS WITH OR WITHOUT FEVER  NAUSEA AND VOMITING THAT IS NOT CONTROLLED WITH YOUR NAUSEA MEDICATION  *UNUSUAL SHORTNESS OF BREATH  *UNUSUAL BRUISING OR BLEEDING  TENDERNESS IN MOUTH AND THROAT WITH OR WITHOUT PRESENCE OF ULCERS  *URINARY PROBLEMS  *BOWEL PROBLEMS  UNUSUAL RASH Items with * indicate a potential emergency and should be followed up as soon as possible.  Feel free to call the clinic you have any questions or concerns. The clinic phone number is [phone].  Please show the CHEMO ALERT CARD at check-in to the Emergency Department and triage nurse.   

## 2018-03-10 NOTE — Progress Notes (Signed)
Hematology and Oncology Follow Up Visit  [name] [id number] 06-13-1955 63 y.o. 03/10/2018 10:52 AM    Principle Diagnosis: 63 year old woman with:  1.  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  .  2.  ER, PR positive HER-2 negative left-sided breast cancer diagnosed in 2017.    Prior Therapy:   Carboplatin and Taxotere started on 10/01/2012. Avastin was added with cycle 4. Chemotherapy only discontinued in June of 2015.  She is S/P Avastin maintenance only between June 2015 till March 2017. Therapy discontinued due to progression of disease.  She is status post Doxil salvage chemotherapy therapy discontinued because of hypersensitivity reaction in March 2017.  Carboplatin and AUC of 5 started on 07/08/2015.  Therapy discontinued in August 2019 after progression of disease.   Current therapy:   Gemcitabine will be given on day 1 and day 8 of a 21-day cycle with Avastin will be given once every 3 weeks.  She has been receiving gemcitabine once every 3 weeks for better tolerance.   Interim History: [name] is here for a repeat evaluation.  Since last visit, she continues to experience slight fatigue, weakness and failure to thrive.  She has lost more weight and her appetite has been poor.  She sustained a fall on February 26, 2018 while she was at a doctor's office and was evaluated in the emergency department at that time.  She had a CT scan of the head and the face without any acute fractures.  She had bruises on her face that are currently improving at this time.  She denies any residual complications related to the last cycle of chemotherapy.  Her performance status has declined.  She denies any abdominal pain or early satiety.  She denies any hematochezia or melena.  She does not report any headaches, blurry vision, or syncope.  She denies any alteration mental status or lethargy.  She does not report any chest pain, palpitation, orthopnea  or leg edema.  Has not reported any shortness of breath or cough.  She denies constipation.  She does not report any hematuria or dysuria.  She does not report any arthralgias or myalgias.  She does not report any ecchymosis or petechiae.  Does not report any changes in her mood.  Does not report any hair or nail changes.  Remainder of the review of systems is negative.   Medications: I have reviewed the patient's current medications.  Current Outpatient Medications  Medication Sig Dispense Refill  . acetaminophen (TYLENOL) 325 MG tablet Take 2 tablets (650 mg total) by mouth every 4 (four) hours as needed for mild pain, fever or headache. (Patient not taking: Reported on 02/26/2018) 30 tablet 0  . acetaminophen (TYLENOL) 500 MG tablet Take 500 mg by mouth every 6 (six) hours as needed for moderate pain.    Marland Kitchen ALPRAZolam (XANAX) 0.5 MG tablet Take 0.5 mg by mouth 2 (two) times daily as needed for anxiety.    . AMBULATORY NON FORMULARY MEDICATION Medication Name: Cardizem 2 %/Lidocaine 5% compounded. Apply small amount 1 inch  Inside Anus. 3-4 times daily until symptoms resolve. 30 g 1  . atorvastatin (LIPITOR) 40 MG tablet Take 1 tablet (40 mg total) by mouth daily. 90 tablet 2  . carvedilol (COREG) 3.125 MG tablet Take 1/2 tablet by mouth twice daily with meals. (Patient taking differently: Take 1.56 mg by mouth 2 (two) times daily with a meal. ) 30 tablet 11  . clotrimazole-betamethasone (LOTRISONE) cream Apply  1 application topically 2 (two) times daily. (Patient taking differently: Apply 1 application topically 2 (two) times daily as needed (ring worm). ) 30 g 1  . divalproex (DEPAKOTE ER) 250 MG 24 hr tablet Take 250 mg by mouth daily.     . divalproex (DEPAKOTE ER) 500 MG 24 hr tablet Take 1,000 mg by mouth every evening.     . docusate sodium (COLACE) 100 MG capsule Take 100 mg by mouth daily as needed for mild constipation.    Marland Kitchen lamoTRIgine (LAMICTAL) 100 MG tablet Take 100 mg by mouth 2 (two)  times daily.    Marland Kitchen levothyroxine (SYNTHROID, LEVOTHROID) 100 MCG tablet Take 1 tablet (100 mcg total) by mouth daily. 90 tablet 0  . lidocaine-prilocaine (EMLA) cream Apply topically as needed. Apply to port with every chemotherapy. 30 g 2  . loperamide (IMODIUM A-D) 2 MG tablet Take 2 mg by mouth as needed for diarrhea or loose stools.    Marland Kitchen loratadine (CLARITIN) 10 MG tablet Take 10 mg by mouth daily as needed (with chemo).     . nitroGLYCERIN (NITROSTAT) 0.4 MG SL tablet Place 1 tablet (0.4 mg total) under the tongue every 5 (five) minutes as needed for chest pain. 25 tablet 3  . ondansetron (ZOFRAN ODT) 8 MG disintegrating tablet Take 1 tablet (8 mg total) by mouth every 8 (eight) hours as needed for nausea or vomiting. (Patient not taking: Reported on 02/26/2018) 20 tablet 1  . ondansetron (ZOFRAN) 8 MG tablet TAKE 1 TABLET BY MOUTH EVERY 8 HOURS AS NEEDED FOR NAUSEA AND VOMITING (Patient taking differently: Take 8 mg by mouth every 8 (eight) hours as needed for nausea or vomiting. ) 30 tablet 2  . pantoprazole (PROTONIX) 40 MG tablet Take 1 tablet (40 mg total) by mouth daily. (Patient taking differently: Take 40 mg by mouth at bedtime. ) 90 tablet 1  . prochlorperazine (COMPAZINE) 10 MG tablet Take 1 tablet (10 mg total) by mouth every 6 (six) hours as needed for nausea or vomiting. 30 tablet 0  . rOPINIRole (REQUIP) 0.5 MG tablet Take 3 tablets (1.5 mg total) by mouth at bedtime. 90 tablet 4  . sertraline (ZOLOFT) 100 MG tablet Take 1 tablet (100 mg total) by mouth daily. 90 tablet 1   No current facility-administered medications for this visit.    Facility-Administered Medications Ordered in Other Visits  Medication Dose Route Frequency Provider Last Rate Last Dose  . 0.9 %  sodium chloride infusion   Intravenous Once Wyatt Portela, MD      . bevacizumab (AVASTIN) 900 mg in sodium chloride 0.9 % 100 mL chemo infusion  900 mg Intravenous Once Kataryna Mcquilkin N, MD      . heparin lock flush 100  unit/mL  500 Units Intravenous Once Wyatt Portela, MD      . heparin lock flush 100 unit/mL  500 Units Intracatheter Once PRN Wyatt Portela, MD      . sodium chloride flush (NS) 0.9 % injection 10 mL  10 mL Intravenous PRN Wyatt Portela, MD      . sodium chloride flush (NS) 0.9 % injection 10 mL  10 mL Intracatheter PRN Wyatt Portela, MD         Allergies:  Allergies  Allergen Reactions  . Doxil [Doxorubicin Hcl Liposomal] Anaphylaxis    1st Doxil.   . Pollen Extract Other (See Comments)    Pollen and grass causes a lot sneezing  . Prednisone Other (  See Comments)    "Crawled up the wall" per pt Tolerated flonase and a duo-neb    Past Medical History, Surgical history, Social history, and Family History reviewed and unchanged.    Physical Exam:  Blood pressure 128/78, pulse 60, temperature 97.6 F (36.4 C), temperature source Oral, resp. rate 18, height 5' 0.75" (1.543 m), weight 128 lb 14.4 oz (58.5 kg), SpO2 (!) 60 %.     ECOG 2    General appearance: Alert, awake without any distress. Head:  Facial ecchymosis noted on the left. Oropharynx: Without any thrush or ulcers. Eyes: No scleral icterus. Lymph nodes: No lymphadenopathy noted in the cervical, supraclavicular, or axillary nodes Heart:regular rate and rhythm, without any murmurs or gallops.   Lung: Clear to auscultation without any rhonchi, wheezes or dullness to percussion. Abdomin: Soft, nontender without any shifting dullness or ascites. Musculoskeletal: No clubbing or cyanosis. Neurological: No motor or sensory deficits. Skin: No rashes or lesions.       Lab Results: Lab Results  Component Value Date   WBC 4.2 03/10/2018   HGB 8.6 (L) 03/10/2018   HCT 29.3 (L) 03/10/2018   MCV 104.3 (H) 03/10/2018   PLT 67 (L) 03/10/2018     Chemistry      Component Value Date/Time   NA 140 03/10/2018 0900   NA 142 01/21/2017 0822   K 3.9 03/10/2018 0900   K 4.1 01/21/2017 0822   CL 106 03/10/2018  0900   CO2 27 03/10/2018 0900   CO2 27 01/21/2017 0822   BUN 12 03/10/2018 0900   BUN 12.2 01/21/2017 0822   CREATININE 0.74 03/10/2018 0900   CREATININE 0.7 01/21/2017 0822      Component Value Date/Time   CALCIUM 8.4 (L) 03/10/2018 0900   CALCIUM 8.9 01/21/2017 0822   ALKPHOS 70 03/10/2018 0900   ALKPHOS 75 01/21/2017 0822   AST 15 03/10/2018 0900   AST 12 01/21/2017 0822   ALT <6 03/10/2018 0900   ALT 7 01/21/2017 0822   BILITOT 0.8 03/10/2018 0900   BILITOT 0.23 01/21/2017 0822       EXAM: CT CHEST, ABDOMEN, AND PELVIS WITH CONTRAST  TECHNIQUE: Multidetector CT imaging of the chest, abdomen and pelvis was performed following the standard protocol during bolus administration of intravenous contrast.  CONTRAST:  187m OMNIPAQUE IOHEXOL 300 MG/ML  SOLN  COMPARISON:  Multiple exams, including 11/22/2017 and 10/11/2017  FINDINGS: CT CHEST FINDINGS  Cardiovascular: Right Port-A-Cath tip: SVC.  Coronary, aortic arch, and branch vessel atherosclerotic vascular disease.  Mediastinum/Nodes: Left upper prevascular node 0.7 cm in short axis on image 10/2, formerly 0.3 cm.  Right paratracheal node 1.0 cm in short axis on image 20/2, formerly the same by my measurement.  Subcarinal lymph node 1.3 cm in short axis on image 26/2, formerly 1.1 cm.  A left lower axillary node measures 0.7 cm in short axis on image 24/2, formerly 0.4 cm.  Lungs/Pleura: Unremarkable  Musculoskeletal: Thoracic spondylosis. Right mastectomy with stable indistinct density in the subcutaneous tissues along the mastectomy site as on image 27/2 common not appreciably changed from prior.  CT ABDOMEN PELVIS FINDINGS  Hepatobiliary: Trace perihepatic ascites similar to prior exams. Slightly nodular liver margin, query cirrhosis versus tumor implants. Mild gallbladder wall thickening, nonspecific.  Pancreas: Unremarkable  Spleen: The spleen measures 13.0 by 4.5 by 11.5 cm  (volume = 350 cm^3).  Adrenals/Urinary Tract: Wall thickening in the urinary bladder may be due to nondistention. Small hypodense lesion of the right kidney  lower pole is statistically likely to be a cyst but technically nonspecific. Otherwise unremarkable.  Stomach/Bowel: Unremarkable  Vascular/Lymphatic: Aortoiliac atherosclerotic vascular disease. Generally improved abdominal adenopathy. A left periaortic lymph node measures 0.9 cm in short axis on image 65/2, formerly 1.5 cm by my measurement on 10/11/2017. A right common iliac node measures 1.1 cm in short axis on image 83/2, previously 1.7 cm by my measurement on 10/11/2017. Right external iliac adenopathy is likewise improved.  Reproductive: Tumor deposition along the adnexa noted with some obscuration of the ovaries.  Other: Overall worsened burden of omental tumor. For example, left paracentral omental tumor measures up to 2.9 cm in thickness, formerly 1.8 cm. Also appears thicker and more solid, and with a longer distribution compared to previous. Left upper quadrant omental tumor adjacent to the spleen is increased.  Musculoskeletal: Confluent subcutaneous density measuring about 2.7 cm in diameter overlying the lateral margin of the left greater trochanter on image 112/2, probably simply from confluent subcutaneous edema, this would be an unlikely appearance and distribution for tumor. Similar leave increased accentuation of density laterally in the right gluteus maximus muscle is new but indistinct, this would be an unusual distribution for tumor.  IMPRESSION: 1. Mixed appearance, with adenopathy in the abdomen and pelvis mildly improved, but with considerably increased omental tumor burden with mild increase in adenopathy in the chest compatible with overall progressive malignancy. 2. Non-specific indistinct subcutaneous density along the lateral margin of the left hip; similar nonspecific accentuated  density laterally in the right gluteus maximus muscle. These would be unusual for metastatic disease given the indistinctness of their margins, but could represent local hematoma or inflammatory process. 3. Other imaging findings of potential clinical significance: Aortic Atherosclerosis (ICD10-I70.0). Coronary atherosclerosis. Right mastectomy. Trace perihepatic ascites. Can not exclude hepatic cirrhosis given the slightly nodular liver contour.   Results for TIFANNY, DOLLENS (MRN [id number]) as of 03/10/2018 11:11  Ref. Range 01/27/2018 09:30 02/17/2018 09:54  Cancer Antigen (CA) 125 Latest Ref Range: 0.0 - 38.1 U/mL 40.5 (H) 54.9 (H)     Impression and Plan:  63 year old woman with:  1.  Ovarian cancer diagnosed in 2014.  She presented with adenocarcinoma with peritoneal carcinomatosis at that time.  She is currently on a Avastin and gemcitabine with tumor progression.  Her tumor marker has increased and CT scan on March 07, 2018 was personally reviewed.  Although she has mixed response with reduction in her lymphadenopathy there is significant progression in her peritoneal disease.  The natural course of this disease and treatment options were discussed which include different salvage therapy versus supportive care and potentially hospice.  She still desires aggressive therapy at this time and would like to try a different chemotherapy.  These options would include vinorelbine and Topotican among others.  Complication associated with chemotherapy to include nausea, fatigue and myelosuppression among others.  After discussion we have elected to proceed with Topotecan at 4 mg intravenously once every 3 weeks for better tolerance given her neutropenia and thrombocytopenia.  She will continue to receive Avastin every 3 weeks.  This therapy will start on March 31, 2018.  We will repeat imaging studies after 3 months to assess her response.   2. IV access: Port-A-Cath remains in place  without any complications.  3. Thrombocytopenia: Platelet count improved at this time without any active bleeding.  4. Left breast cancer: No intervention needed at this time given her peritoneal carcinomatosis.  No progression of disease is noted.   5. Neutropenia:  She will receive growth factor support after each cycle of chemotherapy given her neutropenia.  6.  Anemia: Hemoglobin continues to improve without any transfusion.  Anemia is related to chemotherapy.  7.  Nausea: We have discussed strategies to improve her nausea including regular use of antiemetics.  8. Followup: We will be in 3 weeks for cycle of chemotherapy.  25  minutes was spent with the patient face-to-face today.  More than 50% of time was dedicated to reviewing disease status, imaging studies and discussing options of therapy.  Zola Button, MD 1/20/202010:52 AM

## 2018-03-10 NOTE — Telephone Encounter (Signed)
Patient declined calendar and avs. °

## 2018-03-10 NOTE — Progress Notes (Signed)
Clarified treatment with MD. Avastin only q 3 weeks.  Hardie Pulley, PharmD, BCPS, BCOP

## 2018-03-11 ENCOUNTER — Encounter: Payer: Self-pay | Admitting: Physician Assistant

## 2018-03-11 ENCOUNTER — Inpatient Hospital Stay: Payer: PPO

## 2018-03-11 ENCOUNTER — Ambulatory Visit (INDEPENDENT_AMBULATORY_CARE_PROVIDER_SITE_OTHER): Payer: PPO | Admitting: Physician Assistant

## 2018-03-11 VITALS — BP 108/70 | HR 55 | Temp 98.0°F | Ht 60.75 in | Wt 129.2 lb

## 2018-03-11 DIAGNOSIS — H5789 Other specified disorders of eye and adnexa: Secondary | ICD-10-CM | POA: Diagnosis not present

## 2018-03-11 MED ORDER — ERYTHROMYCIN 5 MG/GM OP OINT: TOPICAL_OINTMENT | OPHTHALMIC | 0 refills | Status: AC

## 2018-03-11 NOTE — Progress Notes (Signed)
[name] is a 63 y.o. female here for a new problem.  I acted as a Education administrator for [employer], PA-C Anselmo Pickler, LPN  History of Present Illness:   Chief Complaint  Patient presents with  . Eye Problem    Eye Problem   The left eye is affected. This is a new problem. The current episode started yesterday. The problem occurs intermittently. The problem has been gradually worsening. The injury mechanism was a direct trauma (Two weeks ago fell on left side of face.). The pain is at a severity of 3/10. The pain is mild. There is no known exposure to pink eye. She does not wear contacts. Associated symptoms include an eye discharge (yellow/white drainage) and itching. Pertinent negatives include no blurred vision, eye redness, fever or nausea. She has tried nothing for the symptoms.    She is currently undergoing chemotherapy for breast cancer.  Past Medical History:  Diagnosis Date  . ADD (attention deficit disorder)   . Allergy    seasonal  . Anxiety   . Bipolar disorder ([place])   . Breast cancer ([place]) [id number]   right breast, left breast  . Bronchitis 10/26/2017  . Cancer ([place])    Omentum  . Coronary artery disease   . Depression   . Hematochezia   . History of echocardiogram    a. Limited Echo 9/17: EF 60-65%, no RWMA, Gr 1 DD, trivial AI, trivial MR, GLS -12% (likely underestimated), no pericardial effusion  . Hyperlipidemia   . Hypothyroidism   . Maintenance chemotherapy    Pt has chemo every 3 weeks (on Friday)  . NSTEMI (non-ST elevated myocardial infarction) ([place]) 10/26/2015  . Osteopenia 03/2009   t score -2.1 FRAX 4.6/0.4  . Personal history of chemotherapy   . Personal history of radiation therapy   . Premature menopause   . Sleep apnea    mild  . Urinary incontinence      Social History   Socioeconomic History  . Marital status: Married    Spouse name: [name]  . Number of children: 0  . Years of education: 42  . Highest education level:  Not on file  Occupational History  . Occupation: unemployed    Fish farm manager: [employer], serv  Social Needs  . Financial resource strain: Not on file  . Food insecurity:    Worry: Not on file    Inability: Not on file  . Transportation needs:    Medical: Not on file    Non-medical: Not on file  Tobacco Use  . Smoking status: Never Smoker  . Smokeless tobacco: Never Used  Substance and Sexual Activity  . Alcohol use: Yes    Alcohol/week: 0.0 standard drinks    Comment: very rarely - maybe 1 glass wine every 3 mos  . Drug use: No  . Sexual activity: Not Currently    Birth control/protection: Post-menopausal    Comment: 1st intercourse 40 yo-5 partners  Lifestyle  . Physical activity:    Days per week: Not on file    Minutes per session: Not on file  . Stress: Not on file  Relationships  . Social connections:    Talks on phone: Not on file    Gets together: Not on file    Attends religious service: Not on file    Active member of club or organization: Not on file    Attends meetings of clubs or organizations: Not on file    Relationship status: Not on file  .  Intimate partner violence:    Fear of current or ex partner: Not on file    Emotionally abused: Not on file    Physically abused: Not on file    Forced sexual activity: Not on file  Other Topics Concern  . Not on file  Social History Narrative   Lives at home with husband.    Caffeine use:  Tea/soda occass    Past Surgical History:  Procedure Laterality Date  . BREAST BIOPSY Left 07/07/2015   malignant x 2  . BREAST LUMPECTOMY Right 1989  . BREAST SURGERY  1989   RIGHT LUMPECTOMY, RADIATION AND CHEMO  . CARDIAC CATHETERIZATION N/A 10/26/2015   Procedure: Left Heart Cath and Coronary Angiography;  Surgeon: Wellington Hampshire, MD;  Location: [facility];  Service: Cardiovascular;  Laterality: N/A;  . CARDIAC CATHETERIZATION N/A 10/26/2015   Procedure: Coronary Stent Intervention;  Surgeon: Wellington Hampshire,  MD;  Location: [facility];  Service: Cardiovascular;  Laterality: N/A;  . CARDIAC CATHETERIZATION N/A 11/08/2015   Procedure: Left Heart Cath and Coronary Angiography;  Surgeon: Jolaine Artist, MD;  Location: [facility];  Service: Cardiovascular;  Laterality: N/A;  . drug eluting stent  10/26/2015  . FOOT SURGERY  2013   BILATERAL   . HYSTEROSCOPY  2011   [place]yp  . PELVIC LAPAROSCOPY/ Hysteroscopy  1996    Family History  Problem Relation Age of Onset  . Breast cancer Maternal Aunt 29  . Diabetes Maternal Grandmother   . Heart disease Maternal Grandmother   . Heart disease Maternal Grandfather   . Hypertension Paternal Grandfather   . Heart Problems Paternal Grandfather   . Breast cancer Maternal Aunt        dx. early 63s; had negative GT approx 10 years ago  . Breast cancer Maternal Aunt        dx. 55s with recurrence  . Allergies Father   . Heart disease Mother   . Other Mother        hx of hysterectomy   . Liver cancer Cousin 69       +EtOH  . Cancer Cousin        paternal 1st cousin, once-removed dx. NOS cancer (maybe ovarian) in late 30s-40s  . Cancer Cousin        female paternal 2nd cousin d. of NOS cancer in her 20s-early 42s  . Ovarian cancer Neg Hx   . Colon cancer Neg Hx     Allergies  Allergen Reactions  . Doxil [Doxorubicin Hcl Liposomal] Anaphylaxis    1st Doxil.   . Pollen Extract Other (See Comments)    Pollen and grass causes a lot sneezing  . Prednisone Other (See Comments)    "Crawled up the wall" per pt Tolerated flonase and a duo-neb    Current Medications:   Current Outpatient Medications:  .  acetaminophen (TYLENOL) 325 MG tablet, Take 2 tablets (650 mg total) by mouth every 4 (four) hours as needed for mild pain, fever or headache., Disp: 30 tablet, Rfl: 0 .  acetaminophen (TYLENOL) 500 MG tablet, Take 500 mg by mouth every 6 (six) hours as needed for moderate pain., Disp: , Rfl:  .  ALPRAZolam (XANAX) 0.5 MG tablet, Take  0.5 mg by mouth 2 (two) times daily as needed for anxiety., Disp: , Rfl:  .  AMBULATORY NON FORMULARY MEDICATION, Medication Name: Cardizem 2 %/Lidocaine 5% compounded. Apply small amount 1 inch  Inside Anus. 3-4 times  daily until symptoms resolve., Disp: 30 g, Rfl: 1 .  atorvastatin (LIPITOR) 40 MG tablet, Take 1 tablet (40 mg total) by mouth daily., Disp: 90 tablet, Rfl: 2 .  carvedilol (COREG) 3.125 MG tablet, Take 1/2 tablet by mouth twice daily with meals. (Patient taking differently: Take 1.56 mg by mouth 2 (two) times daily with a meal. ), Disp: 30 tablet, Rfl: 11 .  clotrimazole-betamethasone (LOTRISONE) cream, Apply 1 application topically 2 (two) times daily. (Patient taking differently: Apply 1 application topically 2 (two) times daily as needed (ring worm). ), Disp: 30 g, Rfl: 1 .  divalproex (DEPAKOTE ER) 250 MG 24 hr tablet, Take 250 mg by mouth daily. , Disp: , Rfl:  .  divalproex (DEPAKOTE ER) 500 MG 24 hr tablet, Take 1,000 mg by mouth every evening. , Disp: , Rfl:  .  docusate sodium (COLACE) 100 MG capsule, Take 100 mg by mouth daily as needed for mild constipation., Disp: , Rfl:  .  lamoTRIgine (LAMICTAL) 100 MG tablet, Take 100 mg by mouth 2 (two) times daily., Disp: , Rfl:  .  levothyroxine (SYNTHROID, LEVOTHROID) 100 MCG tablet, Take 1 tablet (100 mcg total) by mouth daily., Disp: 90 tablet, Rfl: 0 .  lidocaine-prilocaine (EMLA) cream, Apply topically as needed. Apply to port with every chemotherapy., Disp: 30 g, Rfl: 2 .  loperamide (IMODIUM A-D) 2 MG tablet, Take 2 mg by mouth as needed for diarrhea or loose stools., Disp: , Rfl:  .  loratadine (CLARITIN) 10 MG tablet, Take 10 mg by mouth daily as needed (with chemo). , Disp: , Rfl:  .  nitroGLYCERIN (NITROSTAT) 0.4 MG SL tablet, Place 1 tablet (0.4 mg total) under the tongue every 5 (five) minutes as needed for chest pain., Disp: 25 tablet, Rfl: 3 .  ondansetron (ZOFRAN ODT) 8 MG disintegrating tablet, Take 1 tablet (8 mg  total) by mouth every 8 (eight) hours as needed for nausea or vomiting., Disp: 20 tablet, Rfl: 1 .  pantoprazole (PROTONIX) 40 MG tablet, Take 1 tablet (40 mg total) by mouth daily. (Patient taking differently: Take 40 mg by mouth at bedtime. ), Disp: 90 tablet, Rfl: 1 .  rOPINIRole (REQUIP) 0.5 MG tablet, Take 3 tablets (1.5 mg total) by mouth at bedtime., Disp: 90 tablet, Rfl: 4 .  sertraline (ZOLOFT) 100 MG tablet, Take 1 tablet (100 mg total) by mouth daily., Disp: 90 tablet, Rfl: 1 .  erythromycin ophthalmic ointment, Apply thin ribbon to eye twice daily, Disp: 3.5 g, Rfl: 0 No current facility-administered medications for this visit.   Facility-Administered Medications Ordered in Other Visits:  .  heparin lock flush 100 unit/mL, 500 Units, Intravenous, Once, Shadad, Firas N, MD .  sodium chloride flush (NS) 0.9 % injection 10 mL, 10 mL, Intravenous, PRN, Alen Blew, Mathis Dad, MD   Review of Systems:   Review of Systems  Constitutional: Negative for fever.  Eyes: Positive for discharge (yellow/white drainage) and itching. Negative for blurred vision and redness.  Gastrointestinal: Negative for nausea.    Vitals:   Vitals:   03/11/18 1137  BP: 108/70  Pulse: (!) 55  Temp: 98 F (36.7 C)  TempSrc: Oral  SpO2: 99%  Weight: 129 lb 4 oz (58.6 kg)  Height: 5' 0.75" (1.543 m)     Body mass index is 24.62 kg/m.  Physical Exam:   Physical Exam Vitals signs and nursing note reviewed.  Constitutional:      General: She is not in acute distress.  Appearance: She is well-developed. She is not ill-appearing or toxic-appearing.  HENT:     Head: Normocephalic and atraumatic.     Right Ear: Tympanic membrane, ear canal and external ear normal. Tympanic membrane is not erythematous, retracted or bulging.     Left Ear: Tympanic membrane, ear canal and external ear normal. Tympanic membrane is not erythematous, retracted or bulging.     Nose: Nose normal.     Right Sinus: No maxillary  sinus tenderness or frontal sinus tenderness.     Left Sinus: No maxillary sinus tenderness or frontal sinus tenderness.     Mouth/Throat:     Pharynx: Uvula midline. No posterior oropharyngeal erythema.  Eyes:     General: Lids are normal.     Conjunctiva/sclera:     Right eye: Right conjunctiva is not injected. No exudate.    Left eye: Left conjunctiva is injected (very mildly). No exudate. Neck:     Trachea: Trachea normal.  Cardiovascular:     Rate and Rhythm: Normal rate and regular rhythm.     Heart sounds: Normal heart sounds, S1 normal and S2 normal.  Pulmonary:     Effort: Pulmonary effort is normal.     Breath sounds: Normal breath sounds. No decreased breath sounds, wheezing, rhonchi or rales.  Lymphadenopathy:     Cervical: No cervical adenopathy.  Skin:    General: Skin is warm and dry.  Neurological:     Mental Status: She is alert.  Psychiatric:        Speech: Speech normal.        Behavior: Behavior normal. Behavior is cooperative.     Assessment and Plan:   Hiya was seen today for eye problem.  Diagnoses and all orders for this visit:  Eye discharge  Other orders -     erythromycin ophthalmic ointment; Apply thin ribbon to eye twice daily   No red flags on exam.  Will initiate erythromycin eye ointment per orders. Discussed using medications as prescribed. Reviewed return precautions including worsening symptoms or any new symptoms. Push fluids and rest. I recommend that patient follow-up if symptoms worsen or persist despite treatment.   . Reviewed expectations re: course of current medical issues. . Discussed self-management of symptoms. . Outlined signs and symptoms indicating need for more acute intervention. . Patient verbalized understanding and all questions were answered. . See orders for this visit as documented in the electronic medical record. . Patient received an After-Visit Summary.  CMA or LPN served as scribe during this visit.  History, Physical, and Plan performed by medical provider. The above documentation has been reviewed and is accurate and complete.   Inda Coke, PA-C

## 2018-03-11 NOTE — Patient Instructions (Signed)
It was great to see you!  Start the eye ointment.  If symptoms worsen, please seek medical attention.  Take care,  Inda Coke PA-C    How to Use Eye Drops and Eye Ointments Your health care provider may prescribe or recommend eye drops or ointments for a variety of reasons, such as to help relieve symptoms like redness, dryness, and itchiness. You may also need to use eye drops or ointments to treat an eye infection or before or after surgery on your eye. You should use eye drops and ointments only as told by your health care provider. You may need to have a caregiver or family member help you place eye drops or ointment in your eye. How to use eye drops Follow these steps when putting eye drops in your eye: 1. Wash your hands with soap and water. 2. Follow any instructions for mixing or shaking eye drops prior to using them. 3. Stand in front of a mirror so that you can see your eye well. 4. Place one finger under your eye and use it to gently pull your lower lid downward. This forms a small pocket to place the drop. Keep that finger in place. 5. Using your other hand, hold the dropper between your thumb and index finger. 6. Position the dropper just above the edge of your lower eyelid. Do not touch the dropper to your lid or your eyeball. 7. Steady your hand. One way to do this is to lean your index finger against your brow. 8. Look up slightly. 9. Slowly and gently squeeze one drop of medicine into your eye near the lower lid. 10. Gently close your eye. 11. Place a finger between your lower eyelid and your nose. Press gently for 2 minutes. This increases the amount of time that the medicine is exposed to the eye and can help prevent certain side effects. 12. Do not rub your eye. How to apply eye ointments Follow these steps when applying eye ointments: 1. Wash your hands with soap and water. 2. Stand in front of a mirror so that you can see your eye well. 3. Place one finger  under your eye and use it to gently pull your lower lid downward. Keep that finger in place. 4. Using your other hand, hold the tip of the tube between your thumb and index finger. Brace your other fingers against your cheek or nose. 5. Hold the tube just above the edge of your lower eyelid. Do not touch the tube to your lid or your eyeball. 6. Line the inner part of your lower lid with ointment. 7. Let go of your lower lid. 8. Gently pull up on your upper lid and look down. This will spread the ointment over the surface of your eye. 9. Let go of the upper lid. 10. Gently close your eyes. If you can, leave them closed for 1-2 minutes. 11. Do not rub your eyes. If you applied the ointment correctly, your vision will be blurry for a few minutes. This is normal. General tips  Make sure you use the eye drops or ointment only as told by your health care provider.  Ask for help from a caregiver or family member if you are unable to apply the eye drops or ointment.  If you have been told to use both eye drops and an eye ointment, apply the eye drops first, then wait 3-4 minutes before you apply the ointment.  Try not to touch the tip of the  dropper or tube to your eye. A dropper or tube that has touched the eye can get germs on it (get contaminated). Summary  Your health care provider may prescribe or recommend eye drops or ointments to relieve symptoms like redness, dryness, and itchiness.  Be sure to wash your hands with soap and water before applying eye drops or ointment.  You may need to have a caregiver or family member help you place eye drops or ointment in your eye.  Make sure you use the eye drops or ointment only as told by your health care provider. This information is not intended to replace advice given to you by your health care provider. Make sure you discuss any questions you have with your health care provider. Document Released: 05/14/2000 Document Revised: 02/07/2017 Document  Reviewed: 02/07/2017 Elsevier Interactive Patient Education  2019 [employer].

## 2018-03-13 ENCOUNTER — Other Ambulatory Visit: Payer: Self-pay | Admitting: Family Medicine

## 2018-03-13 DIAGNOSIS — E785 Hyperlipidemia, unspecified: Secondary | ICD-10-CM

## 2018-03-13 NOTE — Telephone Encounter (Signed)
Ok to refill meds

## 2018-03-17 ENCOUNTER — Other Ambulatory Visit: Payer: Self-pay | Admitting: Oncology

## 2018-03-17 ENCOUNTER — Encounter: Payer: Self-pay | Admitting: Oncology

## 2018-03-26 ENCOUNTER — Encounter: Payer: Self-pay | Admitting: Physician Assistant

## 2018-03-26 ENCOUNTER — Ambulatory Visit: Payer: PPO | Admitting: Physician Assistant

## 2018-03-26 DIAGNOSIS — F310 Bipolar disorder, current episode hypomanic: Secondary | ICD-10-CM

## 2018-03-26 DIAGNOSIS — F422 Mixed obsessional thoughts and acts: Secondary | ICD-10-CM

## 2018-03-26 DIAGNOSIS — G2581 Restless legs syndrome: Secondary | ICD-10-CM

## 2018-03-26 DIAGNOSIS — R454 Irritability and anger: Secondary | ICD-10-CM
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
## 2018-03-28 ENCOUNTER — Other Ambulatory Visit: Payer: Self-pay | Admitting: Oncology

## 2018-03-31 ENCOUNTER — Other Ambulatory Visit: Payer: Self-pay

## 2018-03-31 ENCOUNTER — Ambulatory Visit (HOSPITAL_BASED_OUTPATIENT_CLINIC_OR_DEPARTMENT_OTHER): Payer: PPO | Admitting: Medical

## 2018-03-31 ENCOUNTER — Inpatient Hospital Stay: Payer: PPO

## 2018-03-31 ENCOUNTER — Inpatient Hospital Stay: Payer: PPO | Attending: Oncology

## 2018-03-31 ENCOUNTER — Other Ambulatory Visit: Payer: Self-pay | Admitting: Oncology

## 2018-03-31 ENCOUNTER — Other Ambulatory Visit: Payer: Self-pay | Admitting: Medical

## 2018-03-31 ENCOUNTER — Telehealth: Payer: Self-pay | Admitting: Oncology

## 2018-03-31 ENCOUNTER — Inpatient Hospital Stay (HOSPITAL_BASED_OUTPATIENT_CLINIC_OR_DEPARTMENT_OTHER): Payer: PPO | Admitting: Oncology

## 2018-03-31 ENCOUNTER — Telehealth: Payer: Self-pay

## 2018-03-31 VITALS — BP 106/53 | HR 59 | Temp 98.4°F | Resp 16 | Wt 126.2 lb

## 2018-03-31 VITALS — BP 101/52 | HR 65 | Resp 18

## 2018-03-31 DIAGNOSIS — Z5112 Encounter for antineoplastic immunotherapy: Secondary | ICD-10-CM | POA: Insufficient documentation

## 2018-03-31 DIAGNOSIS — C50912 Malignant neoplasm of unspecified site of left female breast: Secondary | ICD-10-CM

## 2018-03-31 DIAGNOSIS — D696 Thrombocytopenia, unspecified: Secondary | ICD-10-CM | POA: Insufficient documentation

## 2018-03-31 DIAGNOSIS — C569 Malignant neoplasm of unspecified ovary: Secondary | ICD-10-CM | POA: Insufficient documentation

## 2018-03-31 DIAGNOSIS — Z95828 Presence of other vascular implants and grafts: Secondary | ICD-10-CM

## 2018-03-31 DIAGNOSIS — D649 Anemia, unspecified: Secondary | ICD-10-CM

## 2018-03-31 DIAGNOSIS — Z170 Estrogen receptor positive status [ER+]: Secondary | ICD-10-CM | POA: Diagnosis not present

## 2018-03-31 DIAGNOSIS — R109 Unspecified abdominal pain: Secondary | ICD-10-CM

## 2018-03-31 DIAGNOSIS — R531 Weakness: Secondary | ICD-10-CM | POA: Insufficient documentation

## 2018-03-31 DIAGNOSIS — C50919 Malignant neoplasm of unspecified site of unspecified female breast: Secondary | ICD-10-CM

## 2018-03-31 DIAGNOSIS — R21 Rash and other nonspecific skin eruption: Secondary | ICD-10-CM | POA: Insufficient documentation

## 2018-03-31 DIAGNOSIS — K1231 Oral mucositis (ulcerative) due to antineoplastic therapy: Secondary | ICD-10-CM | POA: Insufficient documentation

## 2018-03-31 DIAGNOSIS — I252 Old myocardial infarction: Secondary | ICD-10-CM | POA: Diagnosis not present

## 2018-03-31 DIAGNOSIS — C786 Secondary malignant neoplasm of retroperitoneum and peritoneum: Secondary | ICD-10-CM | POA: Diagnosis not present

## 2018-03-31 DIAGNOSIS — Z79899 Other long term (current) drug therapy: Secondary | ICD-10-CM

## 2018-03-31 DIAGNOSIS — Z5189 Encounter for other specified aftercare: Secondary | ICD-10-CM | POA: Insufficient documentation

## 2018-03-31 DIAGNOSIS — R5381 Other malaise: Secondary | ICD-10-CM

## 2018-03-31 DIAGNOSIS — C482 Malignant neoplasm of peritoneum, unspecified: Secondary | ICD-10-CM

## 2018-03-31 DIAGNOSIS — Z5111 Encounter for antineoplastic chemotherapy: Secondary | ICD-10-CM | POA: Diagnosis not present

## 2018-03-31 LAB — CMP (CANCER CENTER ONLY)
ALT: <6 U/L (ref 0–44)
AST: 22 U/L (ref 15–41)
Albumin: 2.8 g/dL — ABNORMAL LOW (ref 3.5–5.0)
Alkaline Phosphatase: 65 U/L (ref 38–126)
Anion gap: 6 (ref 5–15)
BUN: 14 mg/dL (ref 6–20)
CHLORIDE: 106 mmol/L (ref 98–111)
CO2: 29 mmol/L (ref 22–32)
CREATININE: 0.73 mg/dL (ref 0.44–1.00)
Calcium: 8.3 mg/dL — ABNORMAL LOW (ref 8.9–10.3)
GFR, Est AFR Am: >60 mL/min (ref >60)
GFR, Estimated: >60 mL/min (ref >60)
Glucose, Bld: 89 mg/dL (ref 70–99)
Potassium: 4.4 mmol/L (ref 3.5–5.1)
Sodium: 141 mmol/L (ref 135–145)
Total Bilirubin: 0.7 mg/dL (ref 0.3–1.2)
Total Protein: 7.3 g/dL (ref 6.5–8.1)

## 2018-03-31 LAB — CBC WITH DIFFERENTIAL (CANCER CENTER ONLY)
Abs Immature Granulocytes: 0.02 10*3/uL (ref 0.00–0.07)
Basophils Absolute: 0 10*3/uL (ref 0.0–0.1)
Basophils Relative: 1 %
Eosinophils Absolute: 0 10*3/uL (ref 0.0–0.5)
Eosinophils Relative: 1 %
HCT: 31.1 % — ABNORMAL LOW (ref 36.0–46.0)
Hemoglobin: 9 g/dL — ABNORMAL LOW (ref 12.0–15.0)
Immature Granulocytes: 1 %
Lymphocytes Relative: 34 %
Lymphs Abs: 0.7 10*3/uL (ref 0.7–4.0)
MCH: 29.4 pg (ref 26.0–34.0)
MCHC: 28.9 g/dL — AB (ref 30.0–36.0)
MCV: 101.6 fL — ABNORMAL HIGH (ref 80.0–100.0)
Monocytes Absolute: 0.4 10*3/uL (ref 0.1–1.0)
Monocytes Relative: 19 %
Neutro Abs: 0.9 10*3/uL — ABNORMAL LOW (ref 1.7–7.7)
Neutrophils Relative %: 44 %
Platelet Count: 30 10*3/uL — ABNORMAL LOW (ref 150–400)
RBC: 3.06 MIL/uL — ABNORMAL LOW (ref 3.87–5.11)
RDW: 18.7 % — ABNORMAL HIGH (ref 11.5–15.5)
WBC Count: 2 10*3/uL — ABNORMAL LOW (ref 4.0–10.5)
nRBC: 0 % (ref 0.0–0.2)

## 2018-03-31 MED ORDER — SODIUM CHLORIDE 0.9% FLUSH
10.0000 mL | INTRAVENOUS | Status: DC | PRN
  Administered 2018-03-31: 10 mL
  Filled 2018-03-31: qty 10

## 2018-03-31 MED ORDER — PROCHLORPERAZINE MALEATE 10 MG PO TABS
10.0000 mg | ORAL_TABLET | Freq: Once | ORAL | Status: AC
  Administered 2018-03-31: 10 mg via ORAL

## 2018-03-31 MED ORDER — SODIUM CHLORIDE 0.9 % IV SOLN
900.0000 mg | Freq: Once | INTRAVENOUS | Status: AC
  Administered 2018-03-31: 900 mg via INTRAVENOUS
  Filled 2018-03-31: qty 32

## 2018-03-31 MED ORDER — HEPARIN SOD (PORK) LOCK FLUSH 100 UNIT/ML IV SOLN
500.0000 [IU] | Freq: Once | INTRAVENOUS | Status: AC | PRN
  Administered 2018-03-31: 500 [IU]
  Filled 2018-03-31: qty 5

## 2018-03-31 MED ORDER — SODIUM CHLORIDE 0.9 % IV SOLN
Freq: Once | INTRAVENOUS | Status: DC
  Filled 2018-03-31: qty 250

## 2018-03-31 MED ORDER — TOPOTECAN HCL CHEMO INJECTION 4 MG
4.0000 mg/m2 | Freq: Once | INTRAVENOUS | Status: AC
  Administered 2018-03-31: 6.3 mg via INTRAVENOUS
  Filled 2018-03-31: qty 6.3

## 2018-03-31 MED ORDER — DICYCLOMINE HCL 10 MG PO CAPS: 10.0000 mg | ORAL_CAPSULE | Freq: Three times a day (TID) | ORAL | 1 refills | Status: DC

## 2018-03-31 MED ORDER — PROCHLORPERAZINE MALEATE 10 MG PO TABS
ORAL_TABLET | ORAL | Status: AC
  Filled 2018-03-31: qty 1

## 2018-03-31 MED ORDER — ONDANSETRON 8 MG PO TBDP: 8.0000 mg | ORAL_TABLET | Freq: Three times a day (TID) | ORAL | 1 refills | Status: AC | PRN

## 2018-03-31 NOTE — Progress Notes (Signed)
Per Dr Alen Blew ok to tx with today's labs. No new orders.

## 2018-03-31 NOTE — Progress Notes (Signed)
Per Dr. Alen Blew note today patient to receive both avastin and topotecan. Avastin treatment plan was not active for today, received verbal order from Dr. Alen Blew for patient to receive avastin today and place a new order. Will need a urine protein with next cycle. Already received an ok to treat per Dr. Alen Blew (see RN note).   Jalene Mullet, PharmD PGY2 Hematology/ Oncology Pharmacy Resident 03/31/2018 2:46 PM

## 2018-03-31 NOTE — Patient Instructions (Signed)
Topotecan injection What is this medicine? TOPOTECAN (TOE poe TEE kan) is a chemotherapy drug. It is used to treat lung cancer, ovarian cancer, and cervical cancer. This medicine may be used for other purposes; ask your health care provider or pharmacist if you have questions. COMMON BRAND NAME(S): Hycamtin What should I tell my health care provider before I take this medicine? They need to know if you have any of these conditions: -immune system problems -infection (especially a virus infection such as chickenpox, cold sores, or herpes) -kidney disease -low blood counts, like low white cell, platelet, or red cell counts -lung or breathing disease, like asthma -scarring or thickening of the lungs -an unusual or allergic reaction to topotecan, other medicines, foods, dyes, or preservatives -pregnant or trying to get pregnant -breast-feeding How should I use this medicine? This medicine is for infusion into a vein. It is usually given by a health care professional in a hospital or clinic setting. Talk to your pediatrician regarding the use of this medicine in children. Special care may be needed. Overdosage: If you think you have taken too much of this medicine contact a poison control center or emergency room at once. NOTE: This medicine is only for you. Do not share this medicine with others. What if I miss a dose? It is important not to miss your dose. Call your doctor or health care professional if you are unable to keep an appointment. What may interact with this medicine? -amiodarone -azithromycin -captopril -carvedilol -certain medications for fungal infections like ketoconazole and itraconazole -clarithromycin -conivaptan -cyclosporine -dronedarone -eltrombopag -erythromycin -felodipine -grapefruit juice -lopinavir -quercetin -quinidine -ranolazine -ritonavir -ticagrelor -verapamil This list may not describe all possible interactions. Give your health care provider a  list of all the medicines, herbs, non-prescription drugs, or dietary supplements you use. Also tell them if you smoke, drink alcohol, or use illegal drugs. Some items may interact with your medicine. What should I watch for while using this medicine? This drug may make you feel generally unwell. This is not uncommon, as chemotherapy can affect healthy cells as well as cancer cells. Report any side effects. Continue your course of treatment even though you feel ill unless your doctor tells you to stop. Call your doctor or health care professional for advice if you get a fever, chills or sore throat, or other symptoms of a cold or flu. Do not treat yourself. This drug decreases your body's ability to fight infections. Try to avoid being around people who are sick. This medicine may increase your risk to bruise or bleed. Call your doctor or health care professional if you notice any unusual bleeding. Be careful brushing and flossing your teeth or using a toothpick because you may get an infection or bleed more easily. If you have any dental work done, tell your dentist you are receiving this medicine. Avoid taking products that contain aspirin, acetaminophen, ibuprofen, naproxen, or ketoprofen unless instructed by your doctor. These medicines may hide a fever. Do not become pregnant while taking this medicine or for 6 months after stopping it. Women should inform their doctor if they wish to become pregnant or think they might be pregnant. Men should not father a child while taking this medicine and for 3 months after stopping it. There is a potential for serious side effects to an unborn child. Talk to your health care professional or pharmacist for more information. Do not breast-feed an infant while taking this medicine. In females, this medicine may make it more difficult   to get pregnant. This medicine has also caused reduced sperm counts in some men. This may interfere with the ability to father a child.  You should talk to your doctor or health care professional if you are concerned about your fertility. What side effects may I notice from receiving this medicine? Side effects that you should report to your doctor or health care professional as soon as possible: -allergic reactions like skin rash, itching or hives, swelling of the face, lips, or tongue -breathing difficulties -diarrhea -signs of decreased platelets or bleeding - bruising, pinpoint red spots on the skin, black, tarry stools, blood in the urine -signs of decreased red blood cells - unusually weak or tired, feeling faint or lightheaded, falls -signs and symptoms of infection like fever or chills; cough; sore throat; pain or trouble passing urine Side effects that usually do not require medical attention (report to your doctor or health care professional if they continue or are bothersome): -hair loss -headache -loss of appetite -nausea, vomiting -stomach pain This list may not describe all possible side effects. Call your doctor for medical advice about side effects. You may report side effects to FDA at 1-800-FDA-1088. Where should I keep my medicine? Keep out of the reach of children. This drug is usually given in a hospital or clinic and will not be stored at home. In rare cases, this medicine may be given at home. If you are using this medicine at home, you will be instructed on how to store this medicine. Throw away any unused medicine after the expiration date on the label. NOTE: This sheet is a summary. It may not cover all possible information. If you have questions about this medicine, talk to your doctor, pharmacist, or health care provider.  2019 Elsevier/Gold Standard (2016-11-01 11:35:12)  

## 2018-03-31 NOTE — Telephone Encounter (Signed)
Received call from pharmacist Larene Beach stating that the Avastin treatment plan has to be renewed and on-call MD will not order. This RN received verbal order from Dr. Alen Blew to continue Avastin treatment plan, communicated to [facility].

## 2018-03-31 NOTE — Progress Notes (Signed)
The patient was seen in the infusion room today.  She reported that she has had ongoing episodes of abdominal cramping.  She is receiving cycle 1, day 1 of Hycamtin today.  She reports that she has had ongoing issues with abdominal cramping.  The patient was given a prescription for Bentyl 10 mg p.o. 4 times daily as needed.  Sandi Mealy, MHS, PA-C Physician Assistant

## 2018-03-31 NOTE — Progress Notes (Signed)
Hematology and Oncology Follow Up Visit  [name] [id number] October 10, 1955 63 y.o. 03/31/2018 10:31 AM    Principle Diagnosis: 63 year old woman with:  1.  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  2.  Breast cancer diagnosed in 2017.  She was found to have left-sided ER, PR positive HER-2 negative tumor.   Prior Therapy:   Carboplatin and Taxotere started on 10/01/2012. Avastin was added with cycle 4. Chemotherapy only discontinued in June of 2015.  She is S/P Avastin maintenance only between June 2015 till March 2017. Therapy discontinued due to progression of disease.  She is status post Doxil salvage chemotherapy therapy discontinued because of hypersensitivity reaction in March 2017.  Carboplatin and AUC of 5 started on 07/08/2015.  Therapy discontinued in August 2019 after progression of disease.  Gemcitabine will be given on day 1 and day 8 of a 21-day cycle with Avastin will be given once every 3 weeks.  Therapy started October 2019 till January 2020.  Therapy discontinued because of poor tolerance and progression of disease.  Current therapy:   Topotecan at 4 mg intravenously once every 3 weeks scheduled to start on March 31, 2018.  She will continue to receive Avastin every 3 weeks.   Interim History: [name] returns today for a repeat evaluation.  Since the last visit, she reports overall feeling fair without any recent complaints.  She does report generalized weakness and debilitation but relatively stable overall.  She is ambulating with the help of a cane without any recent falls.  He denies any bleeding or recurrent infections.  She denies any bone pain or pathological fractures.  Performance status although limited is not changed.  Patient denied any alteration mental status, neuropathy, confusion or dizziness.  Denies any headaches or lethargy.  Denies any night sweats, weight loss or  changes in appetite.  Denied orthopnea, dyspnea on exertion or chest discomfort.  Denies shortness of breath, difficulty breathing hemoptysis or cough.  Denies any abdominal distention, nausea, early satiety or dyspepsia.  Denies any hematuria, frequency, dysuria or nocturia.  Denies any skin irritation, dryness or rash.  Denies any ecchymosis or petechiae.  Denies any lymphadenopathy or clotting.  Denies any heat or cold intolerance.  Denies any anxiety or depression.  Remaining review of system is negative.  Medications: I have reviewed the patient's current medications.  Current Outpatient Medications  Medication Sig Dispense Refill  . acetaminophen (TYLENOL) 325 MG tablet Take 2 tablets (650 mg total) by mouth every 4 (four) hours as needed for mild pain, fever or headache. 30 tablet 0  . acetaminophen (TYLENOL) 500 MG tablet Take 500 mg by mouth every 6 (six) hours as needed for moderate pain.    Marland Kitchen ALPRAZolam (XANAX) 0.5 MG tablet Take 0.5 mg by mouth 2 (two) times daily as needed for anxiety.    . AMBULATORY NON FORMULARY MEDICATION Medication Name: Cardizem 2 %/Lidocaine 5% compounded. Apply small amount 1 inch  Inside Anus. 3-4 times daily until symptoms resolve. 30 g 1  . atorvastatin (LIPITOR) 40 MG tablet Take 1 tablet (40 mg total) by mouth daily. 90 tablet 1  . Carnitine-B5-B6 500-15-5 MG TABS Take by mouth.    . carvedilol (COREG) 3.125 MG tablet Take 1/2 tablet by mouth twice daily with meals. (Patient taking differently: Take 1.56 mg by mouth 2 (two) times daily with a meal. ) 30 tablet 11  . clotrimazole-betamethasone (LOTRISONE) cream Apply 1 application topically 2 (two)  times daily. (Patient taking differently: Apply 1 application topically 2 (two) times daily as needed (ring worm). ) 30 g 1  . divalproex (DEPAKOTE ER) 250 MG 24 hr tablet Take 250 mg by mouth daily.     . divalproex (DEPAKOTE ER) 500 MG 24 hr tablet Take 1,000 mg by mouth every evening.     . docusate sodium  (COLACE) 100 MG capsule Take 100 mg by mouth daily as needed for mild constipation.    Marland Kitchen erythromycin ophthalmic ointment Apply thin ribbon to eye twice daily 3.5 g 0  . lamoTRIgine (LAMICTAL) 100 MG tablet Take 100 mg by mouth 2 (two) times daily.    Marland Kitchen levothyroxine (SYNTHROID, LEVOTHROID) 100 MCG tablet Take 1 tablet (100 mcg total) by mouth daily. 90 tablet 0  . lidocaine-prilocaine (EMLA) cream Apply topically as needed. Apply to port with every chemotherapy. 30 g 2  . loperamide (IMODIUM A-D) 2 MG tablet Take 2 mg by mouth as needed for diarrhea or loose stools.    Marland Kitchen loratadine (CLARITIN) 10 MG tablet Take 10 mg by mouth daily as needed (with chemo).     . nitroGLYCERIN (NITROSTAT) 0.4 MG SL tablet Place 1 tablet (0.4 mg total) under the tongue every 5 (five) minutes as needed for chest pain. 25 tablet 3  . ondansetron (ZOFRAN-ODT) 8 MG disintegrating tablet TAKE 1 TABLET (8 MG TOTAL) BY MOUTH EVERY 8 (EIGHT) HOURS AS NEEDED FOR NAUSEA OR VOMITING. 20 tablet 1  . pantoprazole (PROTONIX) 40 MG tablet Take 1 tablet (40 mg total) by mouth daily. (Patient taking differently: Take 40 mg by mouth at bedtime. ) 90 tablet 1  . rOPINIRole (REQUIP) 0.5 MG tablet Take 3 tablets (1.5 mg total) by mouth at bedtime. 90 tablet 3  . sertraline (ZOLOFT) 100 MG tablet Take 1 tablet (100 mg total) by mouth daily. 90 tablet 1   No current facility-administered medications for this visit.    Facility-Administered Medications Ordered in Other Visits  Medication Dose Route Frequency Provider Last Rate Last Dose  . heparin lock flush 100 unit/mL  500 Units Intravenous Once Wyatt Portela, MD      . sodium chloride flush (NS) 0.9 % injection 10 mL  10 mL Intravenous PRN Wyatt Portela, MD      . sodium chloride flush (NS) 0.9 % injection 10 mL  10 mL Intracatheter PRN Wyatt Portela, MD   10 mL at 03/31/18 1019     Allergies:  Allergies  Allergen Reactions  . Doxil [Doxorubicin Hcl Liposomal] Anaphylaxis     1st Doxil.   . Pollen Extract Other (See Comments)    Pollen and grass causes a lot sneezing  . Prednisone Other (See Comments)    "Crawled up the wall" per pt Tolerated flonase and a duo-neb    Past Medical History, Surgical history, Social history, and Family History reviewed and unchanged.    Physical Exam:  Blood pressure (!) 106/53, pulse (!) 59, temperature 98.4 F (36.9 C), temperature source Oral, resp. rate 16, weight 126 lb 3 oz (57.2 kg), SpO2 99 %.     ECOG 2    General appearance: Alert, awake without any distress. Head:  Ecchymosis noted on her face appears to be improving. Oropharynx: Without any thrush or ulcers. Eyes: No scleral icterus. Lymph nodes: No lymphadenopathy noted in the cervical, supraclavicular, or axillary nodes Heart:regular rate and rhythm, without any murmurs or gallops.   Lung: Clear to auscultation without any rhonchi, wheezes  or dullness to percussion. Abdomin: Soft, nontender without any shifting dullness or ascites. Musculoskeletal: No clubbing or cyanosis. Neurological: No motor or sensory deficits. Skin: No rashes or lesions.        Lab Results: Lab Results  Component Value Date   WBC 4.2 03/10/2018   HGB 8.6 (L) 03/10/2018   HCT 29.3 (L) 03/10/2018   MCV 104.3 (H) 03/10/2018   PLT 67 (L) 03/10/2018     Chemistry      Component Value Date/Time   NA 140 03/10/2018 0900   NA 142 01/21/2017 0822   K 3.9 03/10/2018 0900   K 4.1 01/21/2017 0822   CL 106 03/10/2018 0900   CO2 27 03/10/2018 0900   CO2 27 01/21/2017 0822   BUN 12 03/10/2018 0900   BUN 12.2 01/21/2017 0822   CREATININE 0.74 03/10/2018 0900   CREATININE 0.7 01/21/2017 0822      Component Value Date/Time   CALCIUM 8.4 (L) 03/10/2018 0900   CALCIUM 8.9 01/21/2017 0822   ALKPHOS 70 03/10/2018 0900   ALKPHOS 75 01/21/2017 0822   AST 15 03/10/2018 0900   AST 12 01/21/2017 0822   ALT <6 03/10/2018 0900   ALT 7 01/21/2017 0822   BILITOT 0.8 03/10/2018  0900   BILITOT 0.23 01/21/2017 9211        Impression and Plan:  63 year old woman with:  1.  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    She has progressed on the therapy as outlined above and ready to proceed with Topotecan at 4 mg every 3 weeks with a Avastin.  Risks and benefits of proceeding with this therapy was reviewed again and she is agreeable to continue.  She understand this therapy is palliative although her performance status is reasonable and aggressive therapy is warranted.   2. IV access: Port-A-Cath will be used for chemotherapy moving forward.  3. Thrombocytopenia: Platelet is relatively stable without any bleeding noted.  4. Left breast cancer: Relatively stable overall and does not require any additional treatment.   5. Neutropenia: No fevers or infection noted.  She will continue to receive growth factor support after each cycle of chemotherapy.  6.  Anemia: Hemoglobin is improving at this time and does not require any transfusion.  7.  Nausea: Prescription for Zofran with instructions to use it was discussed with the patient today.  8.  Weakness and deconditioning: Referral for physical therapy will be made.  8. Followup: We will be in 3 weeks for cycle of chemotherapy.  25  minutes was spent with the patient face-to-face today.  More than 50% of time was dedicated to reviewing the nature of her disease, treatment options and complications related to therapy.  Zola Button, MD 2/10/202010:31 AM

## 2018-03-31 NOTE — Telephone Encounter (Signed)
Scheduled appt per 2/10 los.  Patient stated they were my chart active and did not need a print out of the calendar and avs.

## 2018-03-31 NOTE — Progress Notes (Signed)
Pt tolerated first time Topotecan well today.

## 2018-04-01 ENCOUNTER — Ambulatory Visit: Payer: PPO

## 2018-04-01 DIAGNOSIS — D709 Neutropenia, unspecified: Secondary | ICD-10-CM | POA: Diagnosis not present

## 2018-04-01 DIAGNOSIS — C786 Secondary malignant neoplasm of retroperitoneum and peritoneum: Secondary | ICD-10-CM | POA: Diagnosis not present

## 2018-04-01 DIAGNOSIS — R2689 Other abnormalities of gait and mobility: Secondary | ICD-10-CM | POA: Diagnosis not present

## 2018-04-01 DIAGNOSIS — R110 Nausea: Secondary | ICD-10-CM | POA: Diagnosis not present

## 2018-04-01 DIAGNOSIS — D696 Thrombocytopenia, unspecified: Secondary | ICD-10-CM | POA: Diagnosis not present

## 2018-04-01 DIAGNOSIS — D649 Anemia, unspecified: Secondary | ICD-10-CM | POA: Diagnosis not present

## 2018-04-01 DIAGNOSIS — C50912 Malignant neoplasm of unspecified site of left female breast: Secondary | ICD-10-CM | POA: Diagnosis not present

## 2018-04-01 DIAGNOSIS — C569 Malignant neoplasm of unspecified ovary: Secondary | ICD-10-CM | POA: Diagnosis not present

## 2018-04-01 MED ORDER — PEGFILGRASTIM-CBQV 6 MG/0.6ML ~~LOC~~ SOSY
PREFILLED_SYRINGE | SUBCUTANEOUS | Status: AC
  Filled 2018-04-01: qty 0.6

## 2018-04-02 ENCOUNTER — Inpatient Hospital Stay: Payer: PPO

## 2018-04-02 DIAGNOSIS — C482 Malignant neoplasm of peritoneum, unspecified: Secondary | ICD-10-CM

## 2018-04-02 MED ORDER — PEGFILGRASTIM-CBQV 6 MG/0.6ML ~~LOC~~ SOSY
6.0000 mg | PREFILLED_SYRINGE | Freq: Once | SUBCUTANEOUS | Status: AC
  Administered 2018-04-02: 6 mg via SUBCUTANEOUS

## 2018-04-02 MED ORDER — PEGFILGRASTIM-CBQV 6 MG/0.6ML ~~LOC~~ SOSY
PREFILLED_SYRINGE | SUBCUTANEOUS | Status: AC
  Filled 2018-04-02: qty 0.6

## 2018-04-03 DIAGNOSIS — G4733 Obstructive sleep apnea (adult) (pediatric): Secondary | ICD-10-CM | POA: Diagnosis not present

## 2018-04-04 ENCOUNTER — Ambulatory Visit: Payer: PPO | Admitting: Psychiatry

## 2018-04-07 ENCOUNTER — Telehealth: Payer: Self-pay

## 2018-04-07 ENCOUNTER — Encounter: Payer: Self-pay | Admitting: Oncology

## 2018-04-07 ENCOUNTER — Inpatient Hospital Stay: Payer: PPO

## 2018-04-07 ENCOUNTER — Inpatient Hospital Stay (HOSPITAL_BASED_OUTPATIENT_CLINIC_OR_DEPARTMENT_OTHER): Payer: PPO | Admitting: Medical

## 2018-04-07 DIAGNOSIS — R531 Weakness: Secondary | ICD-10-CM | POA: Diagnosis not present

## 2018-04-07 DIAGNOSIS — I252 Old myocardial infarction: Secondary | ICD-10-CM

## 2018-04-07 DIAGNOSIS — C50912 Malignant neoplasm of unspecified site of left female breast: Secondary | ICD-10-CM

## 2018-04-07 DIAGNOSIS — C482 Malignant neoplasm of peritoneum, unspecified: Secondary | ICD-10-CM

## 2018-04-07 DIAGNOSIS — Z79899 Other long term (current) drug therapy: Secondary | ICD-10-CM

## 2018-04-07 DIAGNOSIS — C569 Malignant neoplasm of unspecified ovary: Secondary | ICD-10-CM

## 2018-04-07 DIAGNOSIS — Z170 Estrogen receptor positive status [ER+]: Secondary | ICD-10-CM | POA: Diagnosis not present

## 2018-04-07 DIAGNOSIS — R5381 Other malaise: Secondary | ICD-10-CM

## 2018-04-07 DIAGNOSIS — R21 Rash and other nonspecific skin eruption: Secondary | ICD-10-CM | POA: Diagnosis not present

## 2018-04-07 DIAGNOSIS — D696 Thrombocytopenia, unspecified: Secondary | ICD-10-CM

## 2018-04-07 DIAGNOSIS — C786 Secondary malignant neoplasm of retroperitoneum and peritoneum: Secondary | ICD-10-CM

## 2018-04-07 DIAGNOSIS — D649 Anemia, unspecified: Secondary | ICD-10-CM | POA: Diagnosis not present

## 2018-04-07 DIAGNOSIS — K1231 Oral mucositis (ulcerative) due to antineoplastic therapy: Secondary | ICD-10-CM

## 2018-04-07 LAB — CMP (CANCER CENTER ONLY)
ALT: 9 U/L (ref 0–44)
AST: 52 U/L — ABNORMAL HIGH (ref 15–41)
Albumin: 3.2 g/dL — ABNORMAL LOW (ref 3.5–5.0)
Alkaline Phosphatase: 92 U/L (ref 38–126)
Anion gap: 9 (ref 5–15)
BUN: 19 mg/dL (ref 6–20)
CO2: 28 mmol/L (ref 22–32)
Calcium: 9 mg/dL (ref 8.9–10.3)
Chloride: 104 mmol/L (ref 98–111)
Creatinine: 0.84 mg/dL (ref 0.44–1.00)
GFR, Est AFR Am: >60 mL/min
GFR, Estimated: >60 mL/min
Glucose, Bld: 75 mg/dL (ref 70–99)
Potassium: 3.9 mmol/L (ref 3.5–5.1)
Sodium: 141 mmol/L (ref 135–145)
Total Bilirubin: 0.5 mg/dL (ref 0.3–1.2)
Total Protein: 8 g/dL (ref 6.5–8.1)

## 2018-04-07 LAB — CBC WITH DIFFERENTIAL (CANCER CENTER ONLY)
Abs Immature Granulocytes: 0.34 10*3/uL — ABNORMAL HIGH (ref 0.00–0.07)
Basophils Absolute: 0 10*3/uL (ref 0.0–0.1)
Basophils Relative: 1 %
Eosinophils Absolute: 0 10*3/uL (ref 0.0–0.5)
Eosinophils Relative: 0 %
HCT: 31.1 % — ABNORMAL LOW (ref 36.0–46.0)
Hemoglobin: 9.1 g/dL — ABNORMAL LOW (ref 12.0–15.0)
Immature Granulocytes: 5 %
Lymphocytes Relative: 11 %
Lymphs Abs: 0.7 10*3/uL (ref 0.7–4.0)
MCH: 29.4 pg (ref 26.0–34.0)
MCHC: 29.3 g/dL — ABNORMAL LOW (ref 30.0–36.0)
MCV: 100.3 fL — ABNORMAL HIGH (ref 80.0–100.0)
Monocytes Absolute: 0.7 10*3/uL (ref 0.1–1.0)
Monocytes Relative: 11 %
Neutro Abs: 4.7 10*3/uL (ref 1.7–7.7)
Neutrophils Relative %: 72 %
Platelet Count: 17 10*3/uL — ABNORMAL LOW (ref 150–400)
RBC: 3.1 MIL/uL — ABNORMAL LOW (ref 3.87–5.11)
RDW: 18.6 % — ABNORMAL HIGH (ref 11.5–15.5)
WBC Count: 6.6 10*3/uL (ref 4.0–10.5)
nRBC: 0 % (ref 0.0–0.2)

## 2018-04-07 LAB — MAGNESIUM: Magnesium: 1.4 mg/dL — CL (ref 1.7–2.4)

## 2018-04-07 MED ORDER — LIDOCAINE VISCOUS HCL 2 % MT SOLN: 5.0000 mL | OROMUCOSAL | 2 refills | Status: AC | PRN

## 2018-04-07 MED ORDER — TRIAMCINOLONE ACETONIDE 0.025 % EX OINT: 1.0000 "application " | TOPICAL_OINTMENT | Freq: Three times a day (TID) | CUTANEOUS | 1 refills | Status: AC

## 2018-04-07 MED ORDER — MAGIC MOUTHWASH: 10.0000 mL | Freq: Four times a day (QID) | ORAL | 2 refills | Status: AC | PRN

## 2018-04-07 NOTE — Patient Instructions (Signed)
Hypomagnesemia  Hypomagnesemia is a condition in which the level of magnesium in the blood is low. Magnesium is a mineral that is found in many foods. It is used in many different processes in the body. Hypomagnesemia can affect every organ in the body. In severe cases, it can cause life-threatening problems.  What are the causes?  This condition may be caused by:   Not getting enough magnesium in your diet.   Malnutrition.   Problems with absorbing magnesium from the intestines.   Dehydration.   Alcohol abuse.   Vomiting.   Severe or chronic diarrhea.   Some medicines, including medicines that make you urinate more (diuretics).   Certain diseases, such as kidney disease, diabetes, celiac disease, and overactive thyroid.  What are the signs or symptoms?  Symptoms of this condition include:   Loss of appetite.   Nausea and vomiting.   Involuntary shaking or trembling of a body part (tremor).   Muscle weakness.   Tingling in the arms and legs.   Sudden tightening of muscles (muscle spasms).   Confusion.   Psychiatric issues, such as depression, irritability, or psychosis.   A feeling of fluttering of the heart.   Seizures.  These symptoms are more severe if magnesium levels drop suddenly.  How is this diagnosed?  This condition may be diagnosed based on:   Your symptoms and medical history.   A physical exam.   Blood and urine tests.  How is this treated?  Treatment depends on the cause and the severity of the condition. It may be treated with:   A magnesium supplement. This can be taken in pill form. If the condition is severe, magnesium is usually given through an IV.   Changes to your diet. You may be directed to eat foods that have a lot of magnesium, such as green leafy vegetables, peas, beans, and nuts.   Stopping any intake of alcohol.  Follow these instructions at home:          Make sure that your diet includes foods with magnesium. Foods that have a lot of magnesium in them  include:  ? Green leafy vegetables, such as spinach and broccoli.  ? Beans and peas.  ? Nuts and seeds, such as almonds and sunflower seeds.  ? Whole grains, such as whole grain bread and fortified cereals.   Take magnesium supplements if your health care provider tells you to do that. Take them as directed.   Take over-the-counter and prescription medicines only as told by your health care provider.   Have your magnesium levels monitored as told by your health care provider.   When you are active, drink fluids that contain electrolytes.   Avoid drinking alcohol.   Keep all follow-up visits as told by your health care provider. This is important.  Contact a health care provider if:   You get worse instead of better.   Your symptoms return.  Get help right away if you:   Develop severe muscle weakness.   Have trouble breathing.   Feel that your heart is racing.  Summary   Hypomagnesemia is a condition in which the level of magnesium in the blood is low.   Hypomagnesemia can affect every organ in the body.   Treatment may include eating more foods that contain magnesium, taking magnesium supplements, and not drinking alcohol.   Have your magnesium levels monitored as told by your health care provider.  This information is not intended to replace advice given   to you by your health care provider. Make sure you discuss any questions you have with your health care provider.  Document Released: 11/01/2004 Document Revised: 01/07/2017 Document Reviewed: 01/07/2017  Elsevier Interactive Patient Education  2019 [employer]

## 2018-04-07 NOTE — Progress Notes (Signed)
Pt seen by PA Van only, no RN assessment at this time.  PA Van aware. 

## 2018-04-07 NOTE — Telephone Encounter (Signed)
Spoke with patient to set up an appointment to be seen in [facility] today. Patient stated that she does not have transportation and her husband will not be home until 3 pm. Unable to use [facility] transportation as the patient stated that she may need assist in addition to the cane since she is feeling weak and tired. Patient stated that she is going to call some friends and then will contact this RN if she has found transportation for an appointment today otherwise we will scheduled patient to be seen in [facility] first thing in am tomorrow. Advised patient to take Imodium for diarrhea and to try salt water rinses for her mouth to help with the symptoms she is experiencing. Will follow up to scheduled appropriate appointment. Received call back from patient and scheduling message sent to have patient scheduled for lab and [facility] this afternoon.

## 2018-04-09 ENCOUNTER — Telehealth: Payer: Self-pay | Admitting: Medical

## 2018-04-09 NOTE — Progress Notes (Signed)
[facility] Progress Note   [name] [id number] January 03, 1956 63 y.o.  [name] is managed by Dr. Alen Blew  Actively treated with chemotherapy/immunotherapy/hormonal therapy: yes  Current therapy: Bevacizumab and topotecan  Last treated: 03/31/2018 (cycle 1)  Next scheduled appointment with provider: 04/21/2018  Assessment: Plan:    Hypomagnesemia - Plan: magnesium sulfate 4 g in sodium chloride 0.9 % 500 mL  Thrombocytopenia ([place]) - Plan: CBC with Differential ([place] Only), Sample to Blood Bank, CBC with Differential ([place] Only)  Rash - Plan: triamcinolone (KENALOG) 0.025 % ointment  Mucositis due to chemotherapy - Plan: lidocaine (XYLOCAINE) 2 % solution, magic mouthwash SOLN  Primary peritoneal carcinomatosis ([place])   Hypomagnesemia: The patient's chemistry panel returned today with a magnesium of 1.4.  She is unable to return until 04/10/2018 at which time she will be given 4 g of magnesium sulfate IV x1.  Thrombocytopenia: A CBC returned today with a platelet count of 17.  The patient will have a repeat CBC completed when she returns on 04/10/2018.  Rash of the bilateral upper extremities suspected secondary to current treatment: The patient was given a prescription for triamcinolone ointment.  Mucositis: Patient was given a prescription for viscous lidocaine with instructions to swish and swallow every 3 hours as needed for oral tenderness.  She was additionally given a prescription for Magic mouthwash which she can use 4 times daily.  Primary peritoneal carcinomatosis: The patient is status post cycle 1 of bevacizumab and topotecan which was dosed on 03/31/2018.  The patient will follow-up with Dr. Alen Blew on 04/21/2018.  Please see After Visit Summary for patient specific instructions.  Future Appointments  Date Time Provider [place]  04/10/2018  1:45 PM [facility] LAB 3 [facility] None  04/10/2018  2:00 PM [facility]  [place] FLUSH [facility] None  04/10/2018  3:00 PM [facility] INFUSION [facility] None  04/11/2018  2:00 PM Mitchum, Robert, PhD [facility] None  04/21/2018  9:45 AM [facility] ONLY [facility] None  04/21/2018 10:00 AM [facility] [place] FLUSH [facility] None  04/21/2018 10:30 AM Wyatt Portela, MD [facility] None  04/21/2018  1:00 PM [facility] INFUSION [facility] None  04/22/2018  3:15 PM [facility] [place] FLUSH [facility] None  05/07/2018  2:30 PM Hurst, Teresa T, PA-C [facility] None  05/12/2018 10:00 AM [facility] LAB 6 [facility] None  05/12/2018 10:15 AM [facility] [place] FLUSH [facility] None  05/12/2018 10:30 AM Wyatt Portela, MD [facility] None  05/12/2018 12:00 PM [facility] INFUSION [facility] None  05/13/2018  2:30 PM [facility] [place] FLUSH [facility] None    Orders Placed This Encounter  Procedures  . CBC with Differential ([place] Only)  . CBC with Differential ([place] Only)  . Sample to Blood Bank       Subjective:   Patient ID:  [name] is a 63 y.o. (DOB 1955-07-16) female.  Chief Complaint: No chief complaint on file.   HPI [name] is a 63 year old female with a diagnosis of a primary peritoneal carcinomatosis.  She is managed by Dr. Alen Blew and is status post cycle 1 of bevacizumab and topotecan which was dosed on 03/31/2018.  She presents to the clinic today with a report that she has had mouth pain which is hindering her ability to eat.  She also reports that she has developed a erythematous and slightly pruritic rash over her bilateral upper extremities since her most recent treatment.  The patient's labs returned today showing a magnesium of 1.4 and a platelet  count of 17,000.  The patient is unable to return sooner than Thursday for IV magnesium.  She is agreeable to return at that point and to have labs repeated for assessment of her thrombocytopenia.  She denies any fevers, chills, sweats, nausea, vomiting, constipation, or diarrhea.  Medications: I have  reviewed the patient's current medications.  Allergies:  Allergies  Allergen Reactions  . Doxil [Doxorubicin Hcl Liposomal] Anaphylaxis    1st Doxil.   . Pollen Extract Other (See Comments)    Pollen and grass causes a lot sneezing  . Prednisone Other (See Comments)    "Crawled up the wall" per pt Tolerated flonase and a duo-neb    Past Medical History:  Diagnosis Date  . ADD (attention deficit disorder)   . Allergy    seasonal  . Anxiety   . Bipolar disorder ([place])   . Breast cancer ([place]) [id number]   right breast, left breast  . Bronchitis 10/26/2017  . Cancer ([place])    Omentum  . Coronary artery disease   . Depression   . Hematochezia   . History of echocardiogram    a. Limited Echo 9/17: EF 60-65%, no RWMA, Gr 1 DD, trivial AI, trivial MR, GLS -12% (likely underestimated), no pericardial effusion  . Hyperlipidemia   . Hypothyroidism   . Maintenance chemotherapy    Pt has chemo every 3 weeks (on Friday)  . NSTEMI (non-ST elevated myocardial infarction) ([place]) 10/26/2015  . Osteopenia 03/2009   t score -2.1 FRAX 4.6/0.4  . Personal history of chemotherapy   . Personal history of radiation therapy   . Premature menopause   . Sleep apnea    mild  . Urinary incontinence     Past Surgical History:  Procedure Laterality Date  . BREAST BIOPSY Left 07/07/2015   malignant x 2  . BREAST LUMPECTOMY Right 1989  . BREAST SURGERY  1989   RIGHT LUMPECTOMY, RADIATION AND CHEMO  . CARDIAC CATHETERIZATION N/A 10/26/2015   Procedure: Left Heart Cath and Coronary Angiography;  Surgeon: Wellington Hampshire, MD;  Location: [facility];  Service: Cardiovascular;  Laterality: N/A;  . CARDIAC CATHETERIZATION N/A 10/26/2015   Procedure: Coronary Stent Intervention;  Surgeon: Wellington Hampshire, MD;  Location: [facility];  Service: Cardiovascular;  Laterality: N/A;  . CARDIAC CATHETERIZATION N/A 11/08/2015   Procedure: Left Heart Cath and Coronary Angiography;  Surgeon: Jolaine Artist, MD;  Location: [facility];  Service: Cardiovascular;  Laterality: N/A;  . drug eluting stent  10/26/2015  . FOOT SURGERY  2013   BILATERAL   . HYSTEROSCOPY  2011   [place]  . PELVIC LAPAROSCOPY/ Hysteroscopy  1996    Family History  Problem Relation Age of Onset  . Breast cancer Maternal Aunt 29  . Diabetes Maternal Grandmother   . Heart disease Maternal Grandmother   . Heart disease Maternal Grandfather   . Hypertension Paternal Grandfather   . Heart Problems Paternal Grandfather   . Breast cancer Maternal Aunt        dx. early 53s; had negative GT approx 10 years ago  . Breast cancer Maternal Aunt        dx. 54s with recurrence  . Allergies Father   . Heart disease Mother   . Other Mother        hx of hysterectomy   . Liver cancer Cousin 34       +EtOH  . Cancer Cousin  paternal 1st cousin, once-removed dx. NOS cancer (maybe ovarian) in late 30s-40s  . Cancer Cousin        female paternal 2nd cousin d. of NOS cancer in her 20s-early 19s  . Ovarian cancer Neg Hx   . Colon cancer Neg Hx     Social History   Socioeconomic History  . Marital status: Married    Spouse name: [name]  . Number of children: 0  . Years of education: 82  . Highest education level: Not on file  Occupational History  . Occupation: unemployed    Fish farm manager: [employer], [employer]  Social Needs  . Financial resource strain: Not on file  . Food insecurity:    Worry: Not on file    Inability: Not on file  . Transportation needs:    Medical: Not on file    Non-medical: Not on file  Tobacco Use  . Smoking status: Never Smoker  . Smokeless tobacco: Never Used  Substance and Sexual Activity  . Alcohol use: Yes    Alcohol/week: 0.0 standard drinks    Comment: very rarely - maybe 1 glass wine every 3 mos  . Drug use: No  . Sexual activity: Not Currently    Birth control/protection: Post-menopausal    Comment: 1st intercourse 88 yo-5 partners  Lifestyle  . Physical  activity:    Days per week: Not on file    Minutes per session: Not on file  . Stress: Not on file  Relationships  . Social connections:    Talks on phone: Not on file    Gets together: Not on file    Attends religious service: Not on file    Active member of club or organization: Not on file    Attends meetings of clubs or organizations: Not on file    Relationship status: Not on file  . Intimate partner violence:    Fear of current or ex partner: Not on file    Emotionally abused: Not on file    Physically abused: Not on file    Forced sexual activity: Not on file  Other Topics Concern  . Not on file  Social History Narrative   Lives at home with husband.    Caffeine use:  Tea/soda occass    Past Medical History, Surgical history, Social history, and Family history were reviewed and updated as appropriate.   Please see review of systems for further details on the patient's review from today.   Review of Systems:  Review of Systems  Constitutional: Positive for appetite change. Negative for chills, diaphoresis and fever.  HENT: Positive for mouth sores and trouble swallowing. Negative for sore throat.   Respiratory: Negative for cough, shortness of breath and wheezing.   Cardiovascular: Negative for chest pain and palpitations.  Gastrointestinal: Negative for constipation, diarrhea, nausea and vomiting.    Objective:   Physical Exam:  BP 115/61 (BP Location: Left Arm, Patient Position: Sitting)   Pulse 76   Temp 98.6 F (37 C) (Oral)   Resp 17   Ht 5' 0.75" (1.543 m)   Wt 123 lb (55.8 kg)   SpO2 99%   BMI 23.43 kg/m  ECOG: 1  Physical Exam Constitutional:      General: She is not in acute distress.    Appearance: She is not toxic-appearing or diaphoretic.  HENT:     Head: Normocephalic and atraumatic.     Mouth/Throat:     Mouth: Mucous membranes are moist.  Pharynx: No oropharyngeal exudate or posterior oropharyngeal erythema.     Comments: There is  several small areas of ulceration noted on the buccal cavity.  These are mainly along the right lateral buccal membrane. Eyes:     General: No scleral icterus.       Right eye: No discharge.        Left eye: No discharge.  Cardiovascular:     Rate and Rhythm: Normal rate and regular rhythm.     Heart sounds: Normal heart sounds. No murmur. No friction rub. No gallop.   Pulmonary:     Effort: Pulmonary effort is normal. No respiratory distress.     Breath sounds: Normal breath sounds. No wheezing or rales.  Skin:    General: Skin is warm and dry.     Findings: Rash (There is a diffuse erythematous macular rash over the bilateral upper extremities.  No scaling, vesicles, pustules or discharge is noted.) present.  Neurological:     Mental Status: She is alert.     Gait: Gait normal.  Psychiatric:        Mood and Affect: Mood normal.        Behavior: Behavior normal.        Thought Content: Thought content normal.        Judgment: Judgment normal.     Lab Review:     Component Value Date/Time   NA 141 04/07/2018 1543   NA 142 01/21/2017 0822   K 3.9 04/07/2018 1543   K 4.1 01/21/2017 0822   CL 104 04/07/2018 1543   CO2 28 04/07/2018 1543   CO2 27 01/21/2017 0822   GLUCOSE 75 04/07/2018 1543   GLUCOSE 101 01/21/2017 0822   BUN 19 04/07/2018 1543   BUN 12.2 01/21/2017 0822   CREATININE 0.84 04/07/2018 1543   CREATININE 0.7 01/21/2017 0822   CALCIUM 9.0 04/07/2018 1543   CALCIUM 8.9 01/21/2017 0822   PROT 8.0 04/07/2018 1543   PROT 7.6 01/21/2017 0822   ALBUMIN 3.2 (L) 04/07/2018 1543   ALBUMIN 3.3 (L) 01/21/2017 0822   AST 52 (H) 04/07/2018 1543   AST 12 01/21/2017 0822   ALT 9 04/07/2018 1543   ALT 7 01/21/2017 0822   ALKPHOS 92 04/07/2018 1543   ALKPHOS 75 01/21/2017 0822   BILITOT 0.5 04/07/2018 1543   BILITOT 0.23 01/21/2017 0822   GFRNONAA >60 04/07/2018 1543   GFRAA >60 04/07/2018 1543       Component Value Date/Time   WBC 6.6 04/07/2018 1543   WBC 3.8  (L) 02/26/2018 1659   RBC 3.10 (L) 04/07/2018 1543   HGB 9.1 (L) 04/07/2018 1543   HGB 9.3 (L) 01/21/2017 0822   HCT 31.1 (L) 04/07/2018 1543   HCT 28.9 (L) 01/21/2017 0822   PLT 17 (L) 04/07/2018 1543   PLT 71 (L) 01/21/2017 0822   PLT 94 (LL) 05/25/2015 0931   MCV 100.3 (H) 04/07/2018 1543   MCV 100.5 01/21/2017 0822   MCH 29.4 04/07/2018 1543   MCHC 29.3 (L) 04/07/2018 1543   RDW 18.6 (H) 04/07/2018 1543   RDW 15.9 (H) 01/21/2017 0822   LYMPHSABS 0.7 04/07/2018 1543   LYMPHSABS 1.3 01/21/2017 0822   MONOABS 0.7 04/07/2018 1543   MONOABS 0.4 01/21/2017 0822   EOSABS 0.0 04/07/2018 1543   EOSABS 0.0 01/21/2017 0822   EOSABS 0.1 05/25/2015 0931   BASOSABS 0.0 04/07/2018 1543   BASOSABS 0.0 01/21/2017 0822   -------------------------------  Imaging from last 24 hours (if  applicable):  Radiology interpretation: No results found.

## 2018-04-09 NOTE — Telephone Encounter (Signed)
Scheduled appt per 2/19 sch message - left message for patient with appt date and time

## 2018-04-10 ENCOUNTER — Inpatient Hospital Stay: Payer: PPO

## 2018-04-10 ENCOUNTER — Other Ambulatory Visit: Payer: Self-pay | Admitting: Medical

## 2018-04-10 DIAGNOSIS — C50919 Malignant neoplasm of unspecified site of unspecified female breast: Secondary | ICD-10-CM

## 2018-04-10 DIAGNOSIS — R21 Rash and other nonspecific skin eruption: Secondary | ICD-10-CM

## 2018-04-10 DIAGNOSIS — C569 Malignant neoplasm of unspecified ovary: Secondary | ICD-10-CM

## 2018-04-10 DIAGNOSIS — Z95828 Presence of other vascular implants and grafts: Secondary | ICD-10-CM

## 2018-04-10 DIAGNOSIS — D696 Thrombocytopenia, unspecified: Secondary | ICD-10-CM

## 2018-04-10 LAB — CMP (CANCER CENTER ONLY)
ALT: 9 U/L (ref 0–44)
AST: 51 U/L — ABNORMAL HIGH (ref 15–41)
Albumin: 3.1 g/dL — ABNORMAL LOW (ref 3.5–5.0)
Alkaline Phosphatase: 96 U/L (ref 38–126)
Anion gap: 11 (ref 5–15)
BUN: 18 mg/dL (ref 6–20)
CO2: 26 mmol/L (ref 22–32)
Calcium: 8.3 mg/dL — ABNORMAL LOW (ref 8.9–10.3)
Chloride: 102 mmol/L (ref 98–111)
Creatinine: 0.81 mg/dL (ref 0.44–1.00)
GFR, Est AFR Am: >60 mL/min (ref >60)
GFR, Estimated: >60 mL/min (ref >60)
Glucose, Bld: 71 mg/dL (ref 70–99)
Potassium: 3.9 mmol/L (ref 3.5–5.1)
Sodium: 139 mmol/L (ref 135–145)
Total Bilirubin: 0.5 mg/dL (ref 0.3–1.2)
Total Protein: 7.8 g/dL (ref 6.5–8.1)

## 2018-04-10 LAB — CBC WITH DIFFERENTIAL (CANCER CENTER ONLY)
Abs Immature Granulocytes: 1.14 10*3/uL — ABNORMAL HIGH (ref 0.00–0.07)
BASOS ABS: 0 10*3/uL (ref 0.0–0.1)
Basophils Relative: 0 %
EOS ABS: 0 10*3/uL (ref 0.0–0.5)
Eosinophils Relative: 0 %
HCT: 29.9 % — ABNORMAL LOW (ref 36.0–46.0)
Hemoglobin: 8.7 g/dL — ABNORMAL LOW (ref 12.0–15.0)
Immature Granulocytes: 12 %
Lymphocytes Relative: 10 %
Lymphs Abs: 1 10*3/uL (ref 0.7–4.0)
MCH: 29.4 pg (ref 26.0–34.0)
MCHC: 29.1 g/dL — ABNORMAL LOW (ref 30.0–36.0)
MCV: 101 fL — ABNORMAL HIGH (ref 80.0–100.0)
Monocytes Absolute: 0.9 10*3/uL (ref 0.1–1.0)
Monocytes Relative: 9 %
NRBC: 0.3 % — AB (ref 0.0–0.2)
Neutro Abs: 6.6 10*3/uL (ref 1.7–7.7)
Neutrophils Relative %: 69 %
Platelet Count: 17 10*3/uL — ABNORMAL LOW (ref 150–400)
RBC: 2.96 MIL/uL — ABNORMAL LOW (ref 3.87–5.11)
RDW: 18.9 % — ABNORMAL HIGH (ref 11.5–15.5)
WBC: 9.7 10*3/uL (ref 4.0–10.5)

## 2018-04-10 LAB — SAMPLE TO BLOOD BANK

## 2018-04-10 MED ORDER — DIPHENHYDRAMINE HCL 25 MG PO CAPS
ORAL_CAPSULE | ORAL | Status: AC
  Filled 2018-04-10: qty 1

## 2018-04-10 MED ORDER — HEPARIN SOD (PORK) LOCK FLUSH 10 UNIT/ML IV SOLN: 10.0000 [IU] | Freq: Once | INTRAVENOUS | Status: DC

## 2018-04-10 MED ORDER — DIPHENHYDRAMINE HCL 25 MG PO CAPS: 25.0000 mg | ORAL_CAPSULE | Freq: Once | ORAL | Status: DC

## 2018-04-10 MED ORDER — SODIUM CHLORIDE 0.9 % IV SOLN
4.0000 g | Freq: Once | INTRAVENOUS | Status: AC
  Administered 2018-04-10: 4 g via INTRAVENOUS
  Filled 2018-04-10: qty 8

## 2018-04-10 MED ORDER — DIPHENHYDRAMINE HCL 25 MG PO TABS
25.0000 mg | ORAL_TABLET | Freq: Once | ORAL | Status: AC
  Administered 2018-04-10: 25 mg via ORAL

## 2018-04-10 MED ORDER — SODIUM CHLORIDE 0.9% FLUSH
10.0000 mL | INTRAVENOUS | Status: DC | PRN
  Administered 2018-04-10: 10 mL
  Filled 2018-04-10: qty 10

## 2018-04-10 MED ORDER — SODIUM CHLORIDE 0.9 % IV SOLN
INTRAVENOUS | Status: DC
  Administered 2018-04-10: 15:00:00 via INTRAVENOUS
  Filled 2018-04-10: qty 250

## 2018-04-10 MED ORDER — SODIUM CHLORIDE 0.9% FLUSH
10.0000 mL | Freq: Once | INTRAVENOUS | Status: AC
  Administered 2018-04-10: 10 mL
  Filled 2018-04-10: qty 10

## 2018-04-10 MED ORDER — HEPARIN SOD (PORK) LOCK FLUSH 100 UNIT/ML IV SOLN
500.0000 [IU] | Freq: Once | INTRAVENOUS | Status: AC
  Administered 2018-04-10: 500 [IU] via INTRAVENOUS
  Filled 2018-04-10: qty 5

## 2018-04-10 NOTE — Patient Instructions (Signed)
Hypomagnesemia  Hypomagnesemia is a condition in which the level of magnesium in the blood is low. Magnesium is a mineral that is found in many foods. It is used in many different processes in the body. Hypomagnesemia can affect every organ in the body. In severe cases, it can cause life-threatening problems.  What are the causes?  This condition may be caused by:   Not getting enough magnesium in your diet.   Malnutrition.   Problems with absorbing magnesium from the intestines.   Dehydration.   Alcohol abuse.   Vomiting.   Severe or chronic diarrhea.   Some medicines, including medicines that make you urinate more (diuretics).   Certain diseases, such as kidney disease, diabetes, celiac disease, and overactive thyroid.  What are the signs or symptoms?  Symptoms of this condition include:   Loss of appetite.   Nausea and vomiting.   Involuntary shaking or trembling of a body part (tremor).   Muscle weakness.   Tingling in the arms and legs.   Sudden tightening of muscles (muscle spasms).   Confusion.   Psychiatric issues, such as depression, irritability, or psychosis.   A feeling of fluttering of the heart.   Seizures.  These symptoms are more severe if magnesium levels drop suddenly.  How is this diagnosed?  This condition may be diagnosed based on:   Your symptoms and medical history.   A physical exam.   Blood and urine tests.  How is this treated?  Treatment depends on the cause and the severity of the condition. It may be treated with:   A magnesium supplement. This can be taken in pill form. If the condition is severe, magnesium is usually given through an IV.   Changes to your diet. You may be directed to eat foods that have a lot of magnesium, such as green leafy vegetables, peas, beans, and nuts.   Stopping any intake of alcohol.  Follow these instructions at home:          Make sure that your diet includes foods with magnesium. Foods that have a lot of magnesium in them  include:  ? Green leafy vegetables, such as spinach and broccoli.  ? Beans and peas.  ? Nuts and seeds, such as almonds and sunflower seeds.  ? Whole grains, such as whole grain bread and fortified cereals.   Take magnesium supplements if your health care provider tells you to do that. Take them as directed.   Take over-the-counter and prescription medicines only as told by your health care provider.   Have your magnesium levels monitored as told by your health care provider.   When you are active, drink fluids that contain electrolytes.   Avoid drinking alcohol.   Keep all follow-up visits as told by your health care provider. This is important.  Contact a health care provider if:   You get worse instead of better.   Your symptoms return.  Get help right away if you:   Develop severe muscle weakness.   Have trouble breathing.   Feel that your heart is racing.  Summary   Hypomagnesemia is a condition in which the level of magnesium in the blood is low.   Hypomagnesemia can affect every organ in the body.   Treatment may include eating more foods that contain magnesium, taking magnesium supplements, and not drinking alcohol.   Have your magnesium levels monitored as told by your health care provider.  This information is not intended to replace advice given   to you by your health care provider. Make sure you discuss any questions you have with your health care provider.  Document Released: 11/01/2004 Document Revised: 01/07/2017 Document Reviewed: 01/07/2017  Elsevier Interactive Patient Education  2019 [employer]

## 2018-04-11 ENCOUNTER — Telehealth: Payer: Self-pay

## 2018-04-11 ENCOUNTER — Ambulatory Visit (INDEPENDENT_AMBULATORY_CARE_PROVIDER_SITE_OTHER): Payer: PPO | Admitting: Psychiatry

## 2018-04-11 DIAGNOSIS — G2581 Restless legs syndrome: Secondary | ICD-10-CM

## 2018-04-11 DIAGNOSIS — F319 Bipolar disorder, unspecified: Secondary | ICD-10-CM | POA: Diagnosis not present

## 2018-04-11 DIAGNOSIS — F422 Mixed obsessional thoughts and acts: Secondary | ICD-10-CM

## 2018-04-11 DIAGNOSIS — C569 Malignant neoplasm of unspecified ovary: Secondary | ICD-10-CM | POA: Diagnosis not present

## 2018-04-11 NOTE — Progress Notes (Signed)
[facility] Initial Adult Exam  Name: [name] Date: 05/26/2018 MRN: [id number] DOB: [birth year] PCP: Billie Ruddy, MD  Time spent: 62  Guardian/Payee:  self    Paperwork requested:  No   Reason for Visit /Presenting Problem:  PT referred by Donnal Moat, PA-C of this office for psychotherapy, noting active mood disorder, attention-deficit, obsessive-compulsive sxs and current ovarian cancer, in chemotherapy.  Many other medical conditions notable on chart including ongoing heart disease, blood pressure, diabetes, premature menopause, sleep apnea, RLS, GERD, and hx of breast cancer with lumpectomy.    Prior scheduled for initial psychotherapy eval, rescheduled.  Last note from Ms. Adelene Idler includes recent stressors including fall from wheelchair with injury and ED trip, self-restraint from driving due to difficulties working pedals, and resistance to full medication compliance with wish to overcome.  Recent medical note from oncology also points out serious magnesium deficiency and mucositis concurrent with infusion scheduled yesterday and IV magnesium supplement.    PT herself states "I've been in therapy forever".  Txist of 5 years (Will Elwyn Reach; [facility]) took ill last summer, reduced his practice.  Was not able to get a closure session with Will.  Was sensitive to her writing, a very kind soul, felt a strong friendship and alliance there w/o sexual or romantic contamination.  Prior [place] Stanton Kidney) had committed suicide; met at [facility].  PT breast cancer age 27, omentum cancer 2014, now ovarian.  Had worked together on an article about mental health and cancer for roughly 6 mos and the day Stanton Kidney called to tell her the article was accepted was the day she suicided, in 2015.    Right now, "feel like I'm totally falling apart" after immunotherapy.  Oppressed by nausea, vomiting, loss of appetite.  Had meds mixed up last week, hopefully corrected this week.  First two years of  immunotherapy were pretty much SE-free.  "I am so depressed", attributed to losses of bodily integrity and health, as well as loss of therapy relationships.  Afflicted with doubt as to whether she offended at last therapy clinic, [place] seeming to have judged her as a consuming patient, suggestion BPD or Dependent PD and, in PT's estimation, having sternly warned her.  PA made a call earlier to [facility] practice, trying to ascertain better the conditions and meanings of termination, and possibly obtain a message by which she could better settle the ragged ending, but was told he was on leave and unavailable.  Probed early signs of mania according to PT: will feel she recognizes famous people, loss of sleep and appetite, surging energy, easier anger.  Primary interests for therapy -- sustain the will to live through chemo treatment, get further closure on loss of Will.  Supports -- in cancer support group through church, pastor, several friends she can confide in, husband Cecilie Lowers.  Can worry about whether she is dragging him down, though he claims not.  Also concerns for mother, in poor health herself.  Mental Status Exam:   Appearance:   slight build, frail appearance, with face mask much of the time     Behavior:  Appropriate and careful  Motor:  Normal and weakened by diseaseand treatment, hesitant on rising  Speech/Language:   Clear and Coherent and quiet  Affect:  Restricted and tentative, responsive to support though mildly startled  Mood:  anxious and depressed  Thought process:  normal and focused on loss and fear  Thought content:    WNL  Sensory/Perceptual disturbances:    WNL  Orientation:  within normal limits  Attention:  Good  Concentration:  Fair  Memory:  WNL  Fund of knowledge:   Good  Insight:    Good  Judgment:   Good  Impulse Control:  Good   Risk Assessment: Danger to Self:  No Self-injurious Behavior: No Danger to Others: No Duty to  Warn:no Physical Aggression / Violence:No  Access to Firearms a concern: No  Gang Involvement:No  Patient / guardian was educated about steps to take if suicide or homicide risk level increases between visits: yes While future psychiatric events cannot be accurately predicted, the patient does not currently require acute inpatient psychiatric care and does not currently meet [facility] involuntary commitment criteria.  Substance Abuse History: Current substance abuse: No     Past Psychiatric History:   Lengthy, see above and paper chart for PA's initial eval  Abuse History: Not assessed  Family Medical History:  Family History  Problem Relation Age of Onset  . Breast cancer Maternal Aunt 29  . Diabetes Maternal Grandmother   . Heart disease Maternal Grandmother   . Heart disease Maternal Grandfather   . Hypertension Paternal Grandfather   . Heart Problems Paternal Grandfather   . Breast cancer Maternal Aunt        dx. early 33s; had negative GT approx 10 years ago  . Breast cancer Maternal Aunt        dx. 80s with recurrence  . Allergies Father   . Heart disease Mother   . Other Mother        hx of hysterectomy   . Liver cancer Cousin 27       +EtOH  . Cancer Cousin        paternal 1st cousin, once-removed dx. NOS cancer (maybe ovarian) in late 30s-40s  . Cancer Cousin        female paternal 2nd cousin d. of NOS cancer in her 20s-early 53s  . Ovarian cancer Neg Hx   . 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 History of legal issue / charges: Not assessed  Medical History/Surgical History:not reviewed Past Medical History:  Diagnosis Date  . ADD (attention deficit disorder)   . Allergy    seasonal  . Anxiety   . Bipolar disorder ([place])   . Breast cancer ([place]) [id number]   right breast, left breast  . Bronchitis 10/26/2017  . Cancer ([place])    Omentum  . Coronary artery disease   . Depression   . Hematochezia   . History of echocardiogram    a. Limited Echo 9/17: EF 60-65%, no RWMA, Gr 1 DD, trivial AI, trivial MR, GLS -12% (likely underestimated), no pericardial effusion  . Hyperlipidemia   .  Hypothyroidism   . Maintenance chemotherapy    Pt has chemo every 3 weeks (on Friday)  . NSTEMI (non-ST elevated myocardial infarction) ([place]) 10/26/2015  . Osteopenia 03/2009   t score -2.1 FRAX 4.6/0.4  . Personal history of chemotherapy   . Personal history of radiation therapy   . Premature menopause   . Sleep apnea    mild  . Urinary incontinence     Past Surgical History:  Procedure Laterality Date  . BREAST BIOPSY Left 07/07/2015   malignant x 2  . BREAST LUMPECTOMY Right 1989  . BREAST SURGERY  1989   RIGHT LUMPECTOMY, RADIATION AND CHEMO  . CARDIAC CATHETERIZATION N/A 10/26/2015   Procedure: Left Heart Cath and Coronary Angiography;  Surgeon: Wellington Hampshire, MD;  Location: [place] [facility];  Service: Cardiovascular;  Laterality: N/A;  . CARDIAC CATHETERIZATION N/A 10/26/2015   Procedure: Coronary Stent Intervention;  Surgeon: Wellington Hampshire, MD;  Location: [place] [facility];  Service: Cardiovascular;  Laterality: N/A;  . CARDIAC CATHETERIZATION N/A 11/08/2015   Procedure:  Left Heart Cath and Coronary Angiography;  Surgeon: Jolaine Artist, MD;  Location: [place] [facility];  Service: Cardiovascular;  Laterality: N/A;  . drug eluting stent  10/26/2015  . FOOT SURGERY  2013   BILATERAL   . HYSTEROSCOPY  2011   [place]  . PELVIC LAPAROSCOPY/ Hysteroscopy  1996    Medications: Current Outpatient Medications  Medication Sig Dispense Refill  . acetaminophen (TYLENOL) 325 MG tablet Take 2 tablets (650 mg total) by mouth every 4 (four) hours as needed for mild pain, fever or headache. 30 tablet 0  . acetaminophen (TYLENOL) 500 MG tablet Take 500 mg by mouth every 6 (six) hours as needed for moderate pain.    Marland Kitchen ALPRAZolam (XANAX) 0.5 MG tablet Take 0.5 mg by mouth 2 (two) times daily as needed for anxiety.    . AMBULATORY NON FORMULARY MEDICATION Medication Name: Cardizem 2 %/Lidocaine 5% compounded. Apply small amount 1 inch  Inside Anus. 3-4 times daily until symptoms resolve. 30 g 1  . atorvastatin (LIPITOR) 40 MG tablet Take 1 tablet (40 mg total) by mouth daily. 90 tablet 1  . Carnitine-B5-B6 500-15-5 MG TABS Take by mouth.    . carvedilol (COREG) 3.125 MG tablet Take 1/2 tablet by mouth twice daily with meals. (Patient taking differently: Take 1.56 mg by mouth 2 (two) times daily with a meal. ) 30 tablet 11  . clotrimazole-betamethasone (LOTRISONE) cream Apply 1 application topically 2 (two) times daily. (Patient taking differently: Apply 1 application topically 2 (two) times daily as needed (ring worm). ) 30 g 1  . dicyclomine (BENTYL) 10 MG capsule TAKE 1 CAPSULE (10 MG TOTAL) BY MOUTH 4 (FOUR) TIMES DAILY - BEFORE MEALS AND AT BEDTIME. 120 capsule 1  . divalproex (DEPAKOTE ER) 250 MG 24 hr tablet Take 1 tablet (250 mg total) by mouth daily. 90 tablet 0  . divalproex (DEPAKOTE ER) 500 MG 24 hr tablet Take 1,000 mg by mouth every evening.     . docusate sodium (COLACE) 100 MG capsule Take 100 mg by mouth daily as needed for mild constipation.    Marland Kitchen erythromycin  ophthalmic ointment Apply thin ribbon to eye twice daily 3.5 g 0  . lamoTRIgine (LAMICTAL) 100 MG tablet Take 100 mg by mouth 2 (two) times daily.    Marland Kitchen levothyroxine (SYNTHROID, LEVOTHROID) 100 MCG tablet Take 1 tablet (100 mcg total) by mouth daily. 90 tablet 0  .  lidocaine (XYLOCAINE) 2 % solution Use as directed 5 mLs in the mouth or throat every 3 (three) hours as needed for mouth pain. 100 mL 2  . lidocaine-prilocaine (EMLA) cream Apply topically as needed. Apply to port with every chemotherapy. 30 g 2  . loperamide (IMODIUM A-D) 2 MG tablet Take 2 mg by mouth as needed for diarrhea or loose stools.    Marland Kitchen loratadine (CLARITIN) 10 MG tablet Take 10 mg by mouth daily as needed (with chemo).     . magic mouthwash SOLN Take 10 mLs by mouth 4 (four) times daily as needed for mouth pain. 240 mL 2  . nitroGLYCERIN (NITROSTAT) 0.4 MG SL tablet Place 1 tablet (0.4 mg total) under the tongue every 5 (five) minutes as needed for chest pain. 25 tablet 3  . ondansetron (ZOFRAN-ODT) 8 MG disintegrating tablet Take 1 tablet (8 mg total) by mouth every 8 (eight) hours as needed for nausea or vomiting. 60 tablet 1  . pantoprazole (PROTONIX) 40 MG tablet Take 1 tablet (40 mg total) by mouth daily. (Patient taking differently: Take 40 mg by mouth at bedtime. ) 90 tablet 1  . rOPINIRole (REQUIP) 0.5 MG tablet Take 3 tablets (1.5 mg total) by mouth at bedtime. 90 tablet 3  . sertraline (ZOLOFT) 100 MG tablet Take 1 tablet (100 mg total) by mouth daily. 90 tablet 1  . traMADol (ULTRAM) 50 MG tablet Take 1 tablet (50 mg total) by mouth every 6 (six) hours as needed. 30 tablet 0  . triamcinolone (KENALOG) 0.025 % ointment Apply 1 application topically 3 (three) times daily. 80 g 1   No current facility-administered medications for this visit.    Facility-Administered Medications Ordered in Other Visits  Medication Dose Route Frequency Provider Last Rate Last Dose  . heparin lock flush 100 unit/mL  500 Units  Intravenous Once Wyatt Portela, MD      . sodium chloride flush (NS) 0.9 % injection 10 mL  10 mL Intravenous PRN Wyatt Portela, MD        Allergies  Allergen Reactions  . Doxil [Doxorubicin Hcl Liposomal] Anaphylaxis    1st Doxil.   . Pollen Extract Other (See Comments)    Pollen and grass causes a lot sneezing  . Prednisone Other (See Comments)    "Crawled up the wall" per pt Tolerated flonase and a duo-neb    Diagnoses:    ICD-10-CM   1. Mixed obsessional thoughts and acts F42.2   2. RLS (restless legs syndrome) G25.81   3. Bipolar I disorder ([place]) F31.9   4. Malignant neoplasm of ovary, unspecified laterality ([place]) C56.9     Plan of Care:  Supportive therapy provided this encounter.  Offered to re-contact former therapist (Will Elwyn Reach, of [facility]) for background and suggestions, verbal ROI in session, written ROI to be done on exit.  Consent to full discussion/collaboration with prescriber.  Oriented to therapy schedule/availability, openness to including husband if/when desired.  Will focus on physical and emotional coping with chemotherapy and diiminished capacity as able.  Blanchie Serve, PhD

## 2018-04-11 NOTE — Telephone Encounter (Signed)
Received a message from patient asking if she can go to the movie theater this weekend. Per Dr. Alen Blew she can go and this was communicated to the patient.

## 2018-04-14 ENCOUNTER — Encounter: Payer: Self-pay | Admitting: Oncology

## 2018-04-14 ENCOUNTER — Other Ambulatory Visit: Payer: Self-pay

## 2018-04-14 MED ORDER — DIVALPROEX SODIUM ER 250 MG PO TB24: 250.0000 mg | ORAL_TABLET | Freq: Every day | ORAL | 0 refills | Status: AC

## 2018-04-14 NOTE — Progress Notes (Signed)
Refill request from [facility] for Divalproex ER 250mg  1 tablet every morning. 90 day supply  Last office visit 03/26/2018  Next office visit 05/07/2018

## 2018-04-15 ENCOUNTER — Telehealth: Payer: Self-pay | Admitting: *Deleted

## 2018-04-15 NOTE — Telephone Encounter (Signed)
Spoke with patient, she had concerns BP:[id number] like menstrual discomfort, was worried that it may be cancer. Per dr Alen Blew, it is not the cancer, will be doing a scan soon to determine cause of new discomfort, and will discuss at her next visit. Patient verbalized understanding.

## 2018-04-18 ENCOUNTER — Ambulatory Visit (INDEPENDENT_AMBULATORY_CARE_PROVIDER_SITE_OTHER): Payer: PPO | Admitting: Psychiatry

## 2018-04-18 DIAGNOSIS — F422 Mixed obsessional thoughts and acts: Secondary | ICD-10-CM

## 2018-04-18 DIAGNOSIS — Z630 Problems in relationship with spouse or partner: Secondary | ICD-10-CM

## 2018-04-18 DIAGNOSIS — F319 Bipolar disorder, unspecified: Secondary | ICD-10-CM | POA: Diagnosis not present

## 2018-04-18 DIAGNOSIS — C569 Malignant neoplasm of unspecified ovary: Secondary | ICD-10-CM | POA: Diagnosis not present

## 2018-04-18 DIAGNOSIS — C481 Malignant neoplasm of specified parts of peritoneum: Secondary | ICD-10-CM | POA: Diagnosis not present

## 2018-04-18 NOTE — Progress Notes (Signed)
Psychotherapy Progress Note [facility] Julie Moore, PhD LP  Patient ID: ARAEYA Padilla     MRN: [id number]     Therapy format: Individual psychotherapy Date: 04/18/2018     Start: 11:15a Stop: 12:03p Time Spent: 48 min  Session narrative -- presenting needs, interim history, self-report of stressors and symptoms, applications of prior therapy, status changes, and interventions made in session Unsteady on feet, low stamina, slow movement and gait today.  Brings two back pillows for comfort and self-support, help getting up.  Right thigh hurts today, on 4-point cane.  Says she has fallen twice in recent memory (5-6 weeks).  Driven today by a church friend [name]).  Physical therapy coming.  Glad to see herself able to roll in and out of the bed now, vs. More incapacitated before.  Watching documentary on babies and noting similarities in what she has to learn physically.    Got a good, relieving conversation with a friend last weekend about the painful loss history with last two therapists.  Lightened the load emotionally, not a focal topic today.  Affirmed the support sought and obtained.  Says husband [name] has been irritable during her illness, tends to be grumpy anyway, but kinder lately.  Not sure why, but suspects he has been challenged to be more conscientious how he treats her, more patient the last few days, especially last night, with requests for help (water, how to work the Arboriculturist).  This morning he was, sweetly, reluctant to go to work.  26 years together, family trait of irritability among the men in his family.  Last week had a visit from friend [name] for domestic help, aware she can be pushy, saw husband get gruff with her but also loiter nearby when PT and friend tried to have a more private, "girl talk" conversation in a little used room.  Hypothesizes he may be jealous of her.    Discussed marital dynamics and communication,  evaluated hypotheses and clarified PT's interpretations.  Noted common habit to take each other for granted in family life, suggested be sure to let him know she noticed the kindness and patience and simply thank him.  If desired, ask if it's something he was trying to do (as an entree to reward improving effort).    Therapeutic modalities: Cognitive Behavioral Therapy and Assertiveness/Communication  Mental Status/Observations:  Appearance:   Casual and very warmly dressed, no face mask     Behavior:  Appropriate  Motor:  slowed, weakened, slightly unsteady  Speech/Language:   Clear and Coherent and quiet tone  Affect:  weary/tired  Mood:  anxious and wearied  Thought process:  normal  Thought content:    WNL  Sensory/Perceptual disturbances:    WNL  Orientation:  WNL  Attention:  generally good, affected by pain and weariness  Concentration:  Fair  Memory:  grossly intact  Insight:    Good  Judgment:   Good  Impulse Control:  Good   Risk Assessment: Danger to Self:  No Self-injurious Behavior: No Danger to Others: No Duty to Warn:no Physical Aggression / Violence:No  Access to Firearms a concern: No   Diagnosis:   ICD-10-CM   1. Bipolar I disorder ([place]) F31.9   2. Malignant neoplasm of ovary, unspecified laterality ([place]) C56.9   3. Cancer of omentum ([place]) C48.1   4. Relationship problem between partners Z63.0   5. Mixed obsessional thoughts and acts F42.2    Assessment of progress:  stable  Plan:  . Priorities to eat, further, as tolerated.  Resolved ask husband to make jello. . Suggested to thank him for his show of patience and tenderness lately . Option to ask if desired whether there is any problem with [name] . Continue to utilize previously learned skills ad lib . Maintain medication as prescribed and work faithfully with relevant prescriber(s) if any changes are desired or seem indicated . Call the clinic on-call service, present to ER, or call 911 if any  life-threatening emergency Return in about 1 week (around 04/25/2018) for as scheduled already.   Blanchie Serve, PhD [place] Licensed Psychologist

## 2018-04-21 ENCOUNTER — Inpatient Hospital Stay: Payer: PPO

## 2018-04-21 ENCOUNTER — Inpatient Hospital Stay: Payer: PPO | Attending: Oncology

## 2018-04-21 ENCOUNTER — Telehealth: Payer: Self-pay | Admitting: Oncology

## 2018-04-21 ENCOUNTER — Inpatient Hospital Stay (HOSPITAL_BASED_OUTPATIENT_CLINIC_OR_DEPARTMENT_OTHER): Payer: PPO | Admitting: Oncology

## 2018-04-21 ENCOUNTER — Telehealth: Payer: Self-pay | Admitting: Psychiatry

## 2018-04-21 VITALS — BP 138/96 | HR 77 | Temp 98.0°F | Resp 18 | Ht 60.75 in | Wt 125.7 lb

## 2018-04-21 DIAGNOSIS — Z170 Estrogen receptor positive status [ER+]: Secondary | ICD-10-CM | POA: Diagnosis not present

## 2018-04-21 DIAGNOSIS — C50912 Malignant neoplasm of unspecified site of left female breast: Secondary | ICD-10-CM | POA: Diagnosis not present

## 2018-04-21 DIAGNOSIS — R5381 Other malaise: Secondary | ICD-10-CM | POA: Diagnosis not present

## 2018-04-21 DIAGNOSIS — C569 Malignant neoplasm of unspecified ovary: Secondary | ICD-10-CM

## 2018-04-21 DIAGNOSIS — C50919 Malignant neoplasm of unspecified site of unspecified female breast: Secondary | ICD-10-CM

## 2018-04-21 DIAGNOSIS — Z95828 Presence of other vascular implants and grafts: Secondary | ICD-10-CM

## 2018-04-21 DIAGNOSIS — R5383 Other fatigue: Secondary | ICD-10-CM | POA: Diagnosis not present

## 2018-04-21 DIAGNOSIS — R110 Nausea: Secondary | ICD-10-CM

## 2018-04-21 DIAGNOSIS — M25559 Pain in unspecified hip: Secondary | ICD-10-CM | POA: Diagnosis not present

## 2018-04-21 DIAGNOSIS — C786 Secondary malignant neoplasm of retroperitoneum and peritoneum: Secondary | ICD-10-CM | POA: Insufficient documentation

## 2018-04-21 DIAGNOSIS — Z66 Do not resuscitate: Secondary | ICD-10-CM

## 2018-04-21 DIAGNOSIS — Z79899 Other long term (current) drug therapy: Secondary | ICD-10-CM | POA: Diagnosis not present

## 2018-04-21 LAB — CBC WITH DIFFERENTIAL (CANCER CENTER ONLY)
ABS IMMATURE GRANULOCYTES: 0.11 10*3/uL — AB (ref 0.00–0.07)
Basophils Absolute: 0 10*3/uL (ref 0.0–0.1)
Basophils Relative: 0 %
Eosinophils Absolute: 0 10*3/uL (ref 0.0–0.5)
Eosinophils Relative: 0 %
HEMATOCRIT: 28.1 % — AB (ref 36.0–46.0)
HEMOGLOBIN: 8.2 g/dL — AB (ref 12.0–15.0)
Immature Granulocytes: 2 %
Lymphocytes Relative: 14 %
Lymphs Abs: 0.7 10*3/uL (ref 0.7–4.0)
MCH: 29.2 pg (ref 26.0–34.0)
MCHC: 29.2 g/dL — ABNORMAL LOW (ref 30.0–36.0)
MCV: 100 fL (ref 80.0–100.0)
Monocytes Absolute: 0.4 10*3/uL (ref 0.1–1.0)
Monocytes Relative: 8 %
NEUTROS ABS: 3.7 10*3/uL (ref 1.7–7.7)
Neutrophils Relative %: 76 %
Platelet Count: 55 10*3/uL — ABNORMAL LOW (ref 150–400)
RBC: 2.81 MIL/uL — ABNORMAL LOW (ref 3.87–5.11)
RDW: 20.5 % — ABNORMAL HIGH (ref 11.5–15.5)
WBC Count: 4.9 10*3/uL (ref 4.0–10.5)
nRBC: 1.6 % — ABNORMAL HIGH (ref 0.0–0.2)

## 2018-04-21 LAB — CMP (CANCER CENTER ONLY)
ALT: 10 U/L (ref 0–44)
AST: 81 U/L — ABNORMAL HIGH (ref 15–41)
Albumin: 2.5 g/dL — ABNORMAL LOW (ref 3.5–5.0)
Alkaline Phosphatase: 76 U/L (ref 38–126)
Anion gap: 8 (ref 5–15)
BUN: 17 mg/dL (ref 6–20)
CO2: 27 mmol/L (ref 22–32)
Calcium: 8 mg/dL — ABNORMAL LOW (ref 8.9–10.3)
Chloride: 101 mmol/L (ref 98–111)
Creatinine: 0.72 mg/dL (ref 0.44–1.00)
GFR, Est AFR Am: >60 mL/min (ref >60)
GFR, Estimated: >60 mL/min (ref >60)
Glucose, Bld: 82 mg/dL (ref 70–99)
Potassium: 4.5 mmol/L (ref 3.5–5.1)
Sodium: 136 mmol/L (ref 135–145)
Total Bilirubin: 0.9 mg/dL (ref 0.3–1.2)
Total Protein: 7.3 g/dL (ref 6.5–8.1)

## 2018-04-21 MED ORDER — TRAMADOL HCL 50 MG PO TABS: 50.0000 mg | ORAL_TABLET | Freq: Four times a day (QID) | ORAL | 0 refills | Status: AC | PRN

## 2018-04-21 MED ORDER — SODIUM CHLORIDE 0.9% FLUSH
10.0000 mL | INTRAVENOUS | Status: DC | PRN
  Administered 2018-04-21: 10 mL
  Filled 2018-04-21: qty 10

## 2018-04-21 MED ORDER — HEPARIN SOD (PORK) LOCK FLUSH 100 UNIT/ML IV SOLN
500.0000 [IU] | Freq: Once | INTRAVENOUS | Status: AC | PRN
  Administered 2018-04-21: 500 [IU]
  Filled 2018-04-21: qty 5

## 2018-04-21 NOTE — Telephone Encounter (Signed)
No 3/2 los

## 2018-04-21 NOTE — Progress Notes (Signed)
Hematology and Oncology Follow Up Visit  [name] [id number] November 11, 1955 63 y.o. 04/21/2018 10:40 AM    Principle Diagnosis: 63 year old woman with:  1.  Ovarian cancer with peritoneal carcinomatosis diagnosed in 2014.    2.  Breast cancer presented with left sided breast mass that is ER, PR positive and HER-2 negative diagnosed in 2017.     Prior Therapy:   Carboplatin and Taxotere started on 10/01/2012. Avastin was added with cycle 4. Chemotherapy only discontinued in June of 2015.  She is S/P Avastin maintenance only between June 2015 till March 2017. Therapy discontinued due to progression of disease.  She is status post Doxil salvage chemotherapy therapy discontinued because of hypersensitivity reaction in March 2017.  Carboplatin and AUC of 5 started on 07/08/2015.  Therapy discontinued in August 2019 after progression of disease.  Gemcitabine will be given on day 1 and day 8 of a 21-day cycle with Avastin will be given once every 3 weeks.  Therapy started October 2019 till January 2020.  Therapy discontinued because of poor tolerance and progression of disease.  Current therapy:   Topotecan at 4 mg intravenously once every 3 weeks scheduled to start on March 31, 2018 with Avastin every 3 weeks.   Interim History: [name] is here for a follow-up visit.  Since the last visit, she continues to experience further decline decline in her performance status and quality of life.  She is experienced multiple complications related to chemotherapy including excessive fatigue, tiredness, mouth sores and overall debilitation.  She is spending more time in bed in chair and her appetite continues to decline.  She has lost more weight.  She denies any recent hospitalization or illnesses.  She does report some pain in her hip and leg especially from being in 1 position for an extended period of time.  She denies any falls or syncope.  Patient denied headaches, blurry vision, syncope or  seizures.  Denies any fevers, chills or sweats.  Denied chest pain, palpitation, orthopnea or leg edema.  Denied cough, wheezing or hemoptysis.  Denied nausea, vomiting or abdominal pain.  Denies any constipation or diarrhea.  Denies any frequency urgency or hesitancy.  Denies any arthralgias or myalgias.  Denies any skin rashes or lesions.  Denies any bleeding or clotting tendency.  Denies any easy bruising.  Denies any hair or nail changes.  Denies any anxiety or depression.  Remaining review of system is negative.    Medications: I have reviewed the patient's current medications.  Current Outpatient Medications  Medication Sig Dispense Refill  . acetaminophen (TYLENOL) 325 MG tablet Take 2 tablets (650 mg total) by mouth every 4 (four) hours as needed for mild pain, fever or headache. 30 tablet 0  . acetaminophen (TYLENOL) 500 MG tablet Take 500 mg by mouth every 6 (six) hours as needed for moderate pain.    Marland Kitchen ALPRAZolam (XANAX) 0.5 MG tablet Take 0.5 mg by mouth 2 (two) times daily as needed for anxiety.    . AMBULATORY NON FORMULARY MEDICATION Medication Name: Cardizem 2 %/Lidocaine 5% compounded. Apply small amount 1 inch  Inside Anus. 3-4 times daily until symptoms resolve. 30 g 1  . atorvastatin (LIPITOR) 40 MG tablet Take 1 tablet (40 mg total) by mouth daily. 90 tablet 1  . Carnitine-B5-B6 500-15-5 MG TABS Take by mouth.    . carvedilol (COREG) 3.125 MG tablet Take 1/2 tablet by mouth twice daily with meals. (Patient taking differently: Take 1.56 mg by mouth 2 (two)  times daily with a meal. ) 30 tablet 11  . clotrimazole-betamethasone (LOTRISONE) cream Apply 1 application topically 2 (two) times daily. (Patient taking differently: Apply 1 application topically 2 (two) times daily as needed (ring worm). ) 30 g 1  . dicyclomine (BENTYL) 10 MG capsule Take 1 capsule (10 mg total) by mouth 4 (four) times daily -  before meals and at bedtime. 120 capsule 1  . divalproex (DEPAKOTE ER) 250 MG 24  hr tablet Take 1 tablet (250 mg total) by mouth daily. 90 tablet 0  . divalproex (DEPAKOTE ER) 500 MG 24 hr tablet Take 1,000 mg by mouth every evening.     . docusate sodium (COLACE) 100 MG capsule Take 100 mg by mouth daily as needed for mild constipation.    Marland Kitchen erythromycin ophthalmic ointment Apply thin ribbon to eye twice daily 3.5 g 0  . lamoTRIgine (LAMICTAL) 100 MG tablet Take 100 mg by mouth 2 (two) times daily.    Marland Kitchen levothyroxine (SYNTHROID, LEVOTHROID) 100 MCG tablet Take 1 tablet (100 mcg total) by mouth daily. 90 tablet 0  . lidocaine (XYLOCAINE) 2 % solution Use as directed 5 mLs in the mouth or throat every 3 (three) hours as needed for mouth pain. 100 mL 2  . lidocaine-prilocaine (EMLA) cream Apply topically as needed. Apply to port with every chemotherapy. 30 g 2  . loperamide (IMODIUM A-D) 2 MG tablet Take 2 mg by mouth as needed for diarrhea or loose stools.    Marland Kitchen loratadine (CLARITIN) 10 MG tablet Take 10 mg by mouth daily as needed (with chemo).     . magic mouthwash SOLN Take 10 mLs by mouth 4 (four) times daily as needed for mouth pain. 240 mL 2  . nitroGLYCERIN (NITROSTAT) 0.4 MG SL tablet Place 1 tablet (0.4 mg total) under the tongue every 5 (five) minutes as needed for chest pain. 25 tablet 3  . ondansetron (ZOFRAN-ODT) 8 MG disintegrating tablet Take 1 tablet (8 mg total) by mouth every 8 (eight) hours as needed for nausea or vomiting. 60 tablet 1  . pantoprazole (PROTONIX) 40 MG tablet Take 1 tablet (40 mg total) by mouth daily. (Patient taking differently: Take 40 mg by mouth at bedtime. ) 90 tablet 1  . rOPINIRole (REQUIP) 0.5 MG tablet Take 3 tablets (1.5 mg total) by mouth at bedtime. 90 tablet 3  . sertraline (ZOLOFT) 100 MG tablet Take 1 tablet (100 mg total) by mouth daily. 90 tablet 1  . triamcinolone (KENALOG) 0.025 % ointment Apply 1 application topically 3 (three) times daily. 80 g 1   Current Facility-Administered Medications  Medication Dose Route Frequency  Provider Last Rate Last Dose  . sodium chloride flush (NS) 0.9 % injection 10 mL  10 mL Intracatheter PRN Wyatt Portela, MD   10 mL at 04/21/18 1039   Facility-Administered Medications Ordered in Other Visits  Medication Dose Route Frequency Provider Last Rate Last Dose  . heparin lock flush 100 unit/mL  500 Units Intravenous Once Wyatt Portela, MD      . sodium chloride flush (NS) 0.9 % injection 10 mL  10 mL Intravenous PRN Wyatt Portela, MD         Allergies:  Allergies  Allergen Reactions  . Doxil [Doxorubicin Hcl Liposomal] Anaphylaxis    1st Doxil.   . Pollen Extract Other (See Comments)    Pollen and grass causes a lot sneezing  . Prednisone Other (See Comments)    "Crawled up the  wall" per pt Tolerated flonase and a duo-neb    Past Medical History, Surgical history, Social history, and Family History reviewed and unchanged.    Physical Exam:  Blood pressure (!) 138/96, pulse 77, temperature 98 F (36.7 C), temperature source Oral, resp. rate 18, height 5' 0.75" (1.543 m), weight 125 lb 11.2 oz (57 kg), SpO2 100 %.     ECOG 3    General appearance: Comfortable appearing without any discomfort Head: Normocephalic without any trauma Oropharynx: Mucous membranes are moist and pink without any thrush or ulcers. Eyes: Pupils are equal and round reactive to light. Lymph nodes: No cervical, supraclavicular, inguinal or axillary lymphadenopathy.   Heart:regular rate and rhythm.  S1 and S2 without leg edema. Lung: Clear without any rhonchi or wheezes.  No dullness to percussion. Abdomin: Soft, nontender, nondistended with good bowel sounds.  No hepatosplenomegaly. Musculoskeletal: No joint deformity or effusion.  Full range of motion noted. Neurological: No deficits noted on motor, sensory and deep tendon reflex exam. Skin: Mild erythematous rash noted on her arms and chest wall.          Lab Results: Lab Results  Component Value Date   WBC 4.9  04/21/2018   HGB 8.2 (L) 04/21/2018   HCT 28.1 (L) 04/21/2018   MCV 100.0 04/21/2018   PLT 55 (L) 04/21/2018     Chemistry      Component Value Date/Time   NA 136 04/21/2018 0937   NA 142 01/21/2017 0822   K 4.5 04/21/2018 0937   K 4.1 01/21/2017 0822   CL 101 04/21/2018 0937   CO2 27 04/21/2018 0937   CO2 27 01/21/2017 0822   BUN 17 04/21/2018 0937   BUN 12.2 01/21/2017 0822   CREATININE 0.72 04/21/2018 0937   CREATININE 0.7 01/21/2017 0822      Component Value Date/Time   CALCIUM 8.0 (L) 04/21/2018 0937   CALCIUM 8.9 01/21/2017 0822   ALKPHOS 76 04/21/2018 0937   ALKPHOS 75 01/21/2017 0822   AST 81 (H) 04/21/2018 0937   AST 12 01/21/2017 0822   ALT 10 04/21/2018 0937   ALT 7 01/21/2017 0822   BILITOT 0.9 04/21/2018 0937   BILITOT 0.23 01/21/2017 0822        Impression and Plan:  63 year old woman with:  1.  Ovarian cancer with peritoneal carcinomatosis diagnosed in 2014.    She has progressed on multiple therapies outlined above and currently is experiencing worsening performance status and quality of life.  The natural course of her disease was reviewed again and treatment options were discussed.  At this time I have recommended suspending all chemotherapy given the rapid decline in her performance status and her disease progression.  I feel that the benefit of any chemotherapy is marginal if any at all given her performance status.  I have recommended proceeding with hospice in the near future.  Chemotherapy will be discontinued and they will consider hospice enrollment in the near future.   2. IV access: Port-A-Cath will be in place and flushed periodically.  3. Thrombocytopenia: Appears adequate without any intervention is needed.  4.  Nausea: Zofran is available to her at this time.  5.  Pain: Predominantly in her hip and leg.  This could be related to advanced malignancy and prescription for tramadol will be available to her.  6.  Prognosis and goals  of care: Her prognosis is poor with limited life expectancy.  She would benefit from hospice enrollment as I anticipate life expectancy of  less than 6 months.  She has a living will and also advanced directives and DNR status.  7. Followup: We will be in 3 weeks for reevaluation.  She also will cancel this appointment if she is unable to leave the house at that time.  We will rely on hospice for updates based on that.  25  minutes was spent with the patient face-to-face today.  More than 50% of time was dedicated to spent on reviewing her disease status, treatment options, prognosis and answering questions regarding future plan of care.   Zola Button, MD 3/2/202010:40 AM

## 2018-04-21 NOTE — Telephone Encounter (Signed)
Phone note -- [name], PhD, [facility]  Patient ID: [name]     MRN: [id number]     Date: 04/21/2018      Call from PT, staff message states not feeling well, took last chemotx, wants to talk.  RTC to PT @ [phone] reached voicemail without identification.  Left non-revealing message to be back in touch.   Blanchie Serve, PhD

## 2018-04-22 ENCOUNTER — Other Ambulatory Visit: Payer: Self-pay | Admitting: Medical

## 2018-04-22 ENCOUNTER — Inpatient Hospital Stay: Payer: PPO

## 2018-04-22 DIAGNOSIS — C50912 Malignant neoplasm of unspecified site of left female breast: Secondary | ICD-10-CM | POA: Diagnosis not present

## 2018-04-22 DIAGNOSIS — R2689 Other abnormalities of gait and mobility: Secondary | ICD-10-CM | POA: Diagnosis not present

## 2018-04-22 DIAGNOSIS — D709 Neutropenia, unspecified: Secondary | ICD-10-CM | POA: Diagnosis not present

## 2018-04-22 DIAGNOSIS — D696 Thrombocytopenia, unspecified: Secondary | ICD-10-CM | POA: Diagnosis not present

## 2018-04-22 DIAGNOSIS — R110 Nausea: Secondary | ICD-10-CM | POA: Diagnosis not present

## 2018-04-22 DIAGNOSIS — C786 Secondary malignant neoplasm of retroperitoneum and peritoneum: Secondary | ICD-10-CM | POA: Diagnosis not present

## 2018-04-22 DIAGNOSIS — C569 Malignant neoplasm of unspecified ovary: Secondary | ICD-10-CM | POA: Diagnosis not present

## 2018-04-22 DIAGNOSIS — D649 Anemia, unspecified: Secondary | ICD-10-CM | POA: Diagnosis not present

## 2018-04-23 ENCOUNTER — Telehealth: Payer: Self-pay

## 2018-04-23 ENCOUNTER — Encounter: Payer: Self-pay | Admitting: Physician Assistant

## 2018-04-23 NOTE — Telephone Encounter (Signed)
Patient spouse, Cecilie Lowers, called to discuss patient diagnosis, disease progression and possible hospice referral. Discussed the last scan along with the patient decline in performance status and difficulty tolerating the treatments. Cecilie Lowers verbalized understanding and agreed that the patient has declined quickly and he feels like she is continuing to decline rapidly and would like for the patient to referred to hospice. Explained that he should receive a call either today or tomorrow to set up the initial visit in the home. He had no other questions and was appreciative of the call. Contacted hospice of [facility] and provided the referral with the spouse Cecilie Lowers as the contact with his contact information.

## 2018-04-24 ENCOUNTER — Ambulatory Visit: Payer: PPO | Admitting: Psychiatry

## 2018-04-24 ENCOUNTER — Telehealth: Payer: Self-pay | Admitting: Psychiatry

## 2018-04-24 NOTE — Telephone Encounter (Signed)
error 

## 2018-04-25 NOTE — Telephone Encounter (Signed)
Please advise refill? 

## 2018-04-26 ENCOUNTER — Other Ambulatory Visit: Payer: Self-pay | Admitting: Medical

## 2018-04-28 NOTE — Telephone Encounter (Signed)
Phone Call -- [name], PhD, [facility]  Patient: [name]     MRN: [id number]     Date: 04/28/2018  Returned call to husband, following up on request to notify former therapist of her terminal status and desire for a closure call, conveying that Will is no longer with [facility] and they have no contact information to share.  News that Erika is very weak, confirmed terminal and in Hospice care.  Husband notes they will see if there is another way to get [place] contact.  Per PT's faith, offered that this may be one of those errands God can run when we cannot.   Blanchie Serve, PhD

## 2018-04-28 NOTE — Telephone Encounter (Signed)
-----   Message from Blanchie Serve, PhD sent at 04/18/2018 11:29 AM EST ----- Regarding: Contact prior therapist for background - ROI in hand to talk to Will Elwyn Reach, of [facility]

## 2018-04-28 NOTE — Telephone Encounter (Signed)
-----   Message from Blanchie Serve, PhD sent at 04/18/2018 11:29 AM EST ----- Regarding: Contact prior therapist for background - ROI in hand to talk to Will Elwyn Reach, of [facility]

## 2018-04-30 ENCOUNTER — Telehealth: Payer: Self-pay | Admitting: *Deleted

## 2018-04-30 ENCOUNTER — Telehealth: Payer: Self-pay | Admitting: Physician Assistant

## 2018-04-30 NOTE — Telephone Encounter (Signed)
Dr. Rica Mote let me know pt is now on hospice. I called home #, LM letting her know I'm thinking about her.

## 2018-04-30 NOTE — Telephone Encounter (Signed)
Nurse moira calling from hospice. States patient has declined. Very weak, not eating. Stomatitis. discontinued all but comfort meds. nurse [place] may be reached at [phone]

## 2018-05-02 ENCOUNTER — Telehealth: Payer: Self-pay | Admitting: *Deleted

## 2018-05-02 ENCOUNTER — Ambulatory Visit: Payer: PPO | Admitting: Psychiatry

## 2018-05-02 NOTE — Telephone Encounter (Signed)
11:00 am: Received [facility] voicemail.  Informed of [name] receiving notification of Dr. Alen Blew eRx order sent and failed.  Request for Bentyl refill and 90-day supply refused as [name] currently under Hospice care.

## 2018-05-02 NOTE — Telephone Encounter (Signed)
Julie Melnick, RN from Hospice left a message stating [name] has been moved to [facility] for end of life care

## 2018-05-06 ENCOUNTER — Encounter: Payer: Self-pay | Admitting: Oncology

## 2018-05-07 ENCOUNTER — Ambulatory Visit: Payer: PPO | Admitting: Physician Assistant

## 2018-05-12 ENCOUNTER — Ambulatory Visit: Payer: PPO

## 2018-05-12 ENCOUNTER — Other Ambulatory Visit: Payer: PPO

## 2018-05-12 ENCOUNTER — Ambulatory Visit: Payer: PPO | Admitting: Oncology

## 2018-05-13 ENCOUNTER — Ambulatory Visit: Payer: PPO

## 2018-05-21 DEATH — deceased

## 2018-05-26 MED ORDER — PALONOSETRON HCL INJECTION 0.25 MG/5ML
INTRAVENOUS | Status: AC
  Filled 2018-05-26: qty 5
